# Patient Record
Sex: Male | Born: 1940
Health system: Southern US, Community
[De-identification: ages and names within clinical notes are randomized; demographics above are authoritative.]

## PROBLEM LIST (undated history)

## (undated) DIAGNOSIS — H9190 Unspecified hearing loss, unspecified ear: Secondary | ICD-10-CM

## (undated) DIAGNOSIS — D649 Anemia, unspecified: Secondary | ICD-10-CM

## (undated) DIAGNOSIS — R7301 Impaired fasting glucose: Secondary | ICD-10-CM

## (undated) DIAGNOSIS — I2510 Atherosclerotic heart disease of native coronary artery without angina pectoris: Secondary | ICD-10-CM

## (undated) DIAGNOSIS — I10 Essential (primary) hypertension: Secondary | ICD-10-CM

## (undated) DIAGNOSIS — I739 Peripheral vascular disease, unspecified: Secondary | ICD-10-CM

## (undated) DIAGNOSIS — M1990 Unspecified osteoarthritis, unspecified site: Secondary | ICD-10-CM

## (undated) DIAGNOSIS — I714 Abdominal aortic aneurysm, without rupture, unspecified: Secondary | ICD-10-CM

## (undated) DIAGNOSIS — N400 Enlarged prostate without lower urinary tract symptoms: Secondary | ICD-10-CM

## (undated) DIAGNOSIS — I499 Cardiac arrhythmia, unspecified: Secondary | ICD-10-CM

## (undated) DIAGNOSIS — C801 Malignant (primary) neoplasm, unspecified: Secondary | ICD-10-CM

## (undated) DIAGNOSIS — M545 Low back pain, unspecified: Secondary | ICD-10-CM

## (undated) DIAGNOSIS — I219 Acute myocardial infarction, unspecified: Secondary | ICD-10-CM

## (undated) DIAGNOSIS — E7800 Pure hypercholesterolemia, unspecified: Secondary | ICD-10-CM

## (undated) DIAGNOSIS — J449 Chronic obstructive pulmonary disease, unspecified: Secondary | ICD-10-CM

## (undated) DIAGNOSIS — H409 Unspecified glaucoma: Secondary | ICD-10-CM

## (undated) DIAGNOSIS — T7840XA Allergy, unspecified, initial encounter: Secondary | ICD-10-CM

## (undated) DIAGNOSIS — B370 Candidal stomatitis: Secondary | ICD-10-CM

## (undated) DIAGNOSIS — J45909 Unspecified asthma, uncomplicated: Secondary | ICD-10-CM

## (undated) DIAGNOSIS — K219 Gastro-esophageal reflux disease without esophagitis: Secondary | ICD-10-CM

## (undated) HISTORY — DX: Atherosclerotic heart disease of native coronary artery without angina pectoris: I25.10

## (undated) HISTORY — PX: BACK SURGERY: SHX140

## (undated) HISTORY — PX: ESOPHAGOGASTRODUODENOSCOPY: SHX1529

## (undated) HISTORY — PX: OTHER SURGICAL HISTORY: SHX169

## (undated) HISTORY — DX: Pure hypercholesterolemia, unspecified: E78.00

## (undated) HISTORY — DX: Acute myocardial infarction, unspecified: I21.9

## (undated) HISTORY — DX: Abdominal aortic aneurysm, without rupture, unspecified: I71.40

## (undated) HISTORY — DX: Essential (primary) hypertension: I10

## (undated) HISTORY — DX: Allergy, unspecified, initial encounter: T78.40XA

## (undated) HISTORY — DX: Chronic obstructive pulmonary disease, unspecified: J44.9

## (undated) HISTORY — PX: HERNIA REPAIR: SHX51

## (undated) HISTORY — DX: Unspecified asthma, uncomplicated: J45.909

## (undated) HISTORY — PX: CARDIAC CATHETERIZATION: SHX172

## (undated) HISTORY — DX: Impaired fasting glucose: R73.01

## (undated) HISTORY — DX: Abdominal aortic aneurysm, without rupture: I71.4

---

## 1989-08-18 DIAGNOSIS — C4491 Basal cell carcinoma of skin, unspecified: Secondary | ICD-10-CM

## 1989-08-18 HISTORY — DX: Basal cell carcinoma of skin, unspecified: C44.91

## 1997-05-17 DIAGNOSIS — I219 Acute myocardial infarction, unspecified: Secondary | ICD-10-CM | POA: Diagnosis present

## 1997-05-17 DIAGNOSIS — I252 Old myocardial infarction: Secondary | ICD-10-CM | POA: Diagnosis present

## 1997-05-17 HISTORY — DX: Acute myocardial infarction, unspecified: I21.9

## 1997-09-07 ENCOUNTER — Inpatient Hospital Stay (HOSPITAL_COMMUNITY): Admission: EM | Admit: 1997-09-07 | Discharge: 1997-09-11 | Payer: Self-pay | Admitting: Cardiology

## 1998-05-17 HISTORY — PX: CHOLECYSTECTOMY: SHX55

## 2000-12-02 ENCOUNTER — Emergency Department (HOSPITAL_COMMUNITY): Admission: EM | Admit: 2000-12-02 | Discharge: 2000-12-02 | Payer: Self-pay | Admitting: Internal Medicine

## 2001-11-22 DIAGNOSIS — C4492 Squamous cell carcinoma of skin, unspecified: Secondary | ICD-10-CM

## 2001-11-22 HISTORY — DX: Squamous cell carcinoma of skin, unspecified: C44.92

## 2003-10-09 DIAGNOSIS — D039 Melanoma in situ, unspecified: Secondary | ICD-10-CM

## 2003-10-09 HISTORY — DX: Melanoma in situ, unspecified: D03.9

## 2004-08-30 ENCOUNTER — Emergency Department (HOSPITAL_COMMUNITY): Admission: EM | Admit: 2004-08-30 | Discharge: 2004-08-30 | Payer: Self-pay | Admitting: Emergency Medicine

## 2006-06-15 ENCOUNTER — Emergency Department (HOSPITAL_COMMUNITY): Admission: EM | Admit: 2006-06-15 | Discharge: 2006-06-15 | Payer: Self-pay | Admitting: Emergency Medicine

## 2006-10-28 ENCOUNTER — Ambulatory Visit (HOSPITAL_COMMUNITY): Admission: RE | Admit: 2006-10-28 | Discharge: 2006-10-28 | Payer: Self-pay | Admitting: Family Medicine

## 2007-01-26 ENCOUNTER — Ambulatory Visit: Payer: Self-pay | Admitting: Cardiovascular Disease

## 2007-03-24 ENCOUNTER — Ambulatory Visit: Payer: Self-pay | Admitting: Cardiovascular Disease

## 2007-05-18 HISTORY — PX: COLONOSCOPY: SHX174

## 2007-08-28 ENCOUNTER — Ambulatory Visit: Payer: Self-pay | Admitting: Cardiovascular Disease

## 2007-12-07 ENCOUNTER — Ambulatory Visit (HOSPITAL_COMMUNITY): Admission: RE | Admit: 2007-12-07 | Discharge: 2007-12-07 | Payer: Self-pay | Admitting: Family Medicine

## 2007-12-08 ENCOUNTER — Ambulatory Visit: Payer: Self-pay | Admitting: Cardiovascular Disease

## 2007-12-14 ENCOUNTER — Ambulatory Visit: Payer: Self-pay

## 2008-04-15 ENCOUNTER — Ambulatory Visit: Payer: Self-pay | Admitting: Cardiovascular Disease

## 2008-04-15 DIAGNOSIS — I119 Hypertensive heart disease without heart failure: Secondary | ICD-10-CM | POA: Insufficient documentation

## 2008-04-15 DIAGNOSIS — I714 Abdominal aortic aneurysm, without rupture, unspecified: Secondary | ICD-10-CM | POA: Insufficient documentation

## 2008-04-15 DIAGNOSIS — J441 Chronic obstructive pulmonary disease with (acute) exacerbation: Secondary | ICD-10-CM | POA: Insufficient documentation

## 2008-04-15 DIAGNOSIS — E782 Mixed hyperlipidemia: Secondary | ICD-10-CM | POA: Insufficient documentation

## 2008-04-15 DIAGNOSIS — J449 Chronic obstructive pulmonary disease, unspecified: Secondary | ICD-10-CM

## 2008-04-15 DIAGNOSIS — E785 Hyperlipidemia, unspecified: Secondary | ICD-10-CM | POA: Insufficient documentation

## 2008-04-15 DIAGNOSIS — I2510 Atherosclerotic heart disease of native coronary artery without angina pectoris: Secondary | ICD-10-CM | POA: Insufficient documentation

## 2008-11-15 ENCOUNTER — Ambulatory Visit: Payer: Self-pay | Admitting: Cardiovascular Disease

## 2008-11-20 ENCOUNTER — Ambulatory Visit (HOSPITAL_COMMUNITY): Admission: RE | Admit: 2008-11-20 | Discharge: 2008-11-20 | Payer: Self-pay | Admitting: Cardiovascular Disease

## 2008-12-02 ENCOUNTER — Encounter (INDEPENDENT_AMBULATORY_CARE_PROVIDER_SITE_OTHER): Payer: Self-pay | Admitting: *Deleted

## 2008-12-02 LAB — CONVERTED CEMR LAB
ALT: 19 units/L
AST: 18 units/L
Albumin: 4 g/dL
Alkaline Phosphatase: 67 units/L
BUN: 10 mg/dL
Bilirubin, Direct: 0.2 mg/dL
CO2: 26 meq/L
Calcium: 9.2 mg/dL
Chloride: 104 meq/L
Cholesterol: 146 mg/dL
Creatinine, Ser: 1.1 mg/dL
Glucose, Bld: 106 mg/dL
HDL: 44 mg/dL
LDL Cholesterol: 83 mg/dL
Potassium: 5.3 meq/L
Sodium: 141 meq/L
Total Protein: 6.9 g/dL
Triglycerides: 97 mg/dL

## 2009-05-28 ENCOUNTER — Encounter (INDEPENDENT_AMBULATORY_CARE_PROVIDER_SITE_OTHER): Payer: Self-pay | Admitting: *Deleted

## 2009-05-29 ENCOUNTER — Ambulatory Visit: Payer: Self-pay | Admitting: Cardiovascular Disease

## 2009-05-29 DIAGNOSIS — R030 Elevated blood-pressure reading, without diagnosis of hypertension: Secondary | ICD-10-CM | POA: Insufficient documentation

## 2009-12-05 ENCOUNTER — Ambulatory Visit (HOSPITAL_COMMUNITY)
Admission: RE | Admit: 2009-12-05 | Discharge: 2009-12-05 | Payer: Self-pay | Source: Home / Self Care | Admitting: Cardiovascular Disease

## 2009-12-10 ENCOUNTER — Ambulatory Visit: Payer: Self-pay | Admitting: Cardiovascular Disease

## 2010-01-15 ENCOUNTER — Ambulatory Visit (HOSPITAL_COMMUNITY): Admission: RE | Admit: 2010-01-15 | Discharge: 2010-01-15 | Payer: Self-pay | Admitting: Family Medicine

## 2010-02-17 ENCOUNTER — Ambulatory Visit (HOSPITAL_COMMUNITY): Admission: RE | Admit: 2010-02-17 | Discharge: 2010-02-17 | Payer: Self-pay | Admitting: Family Medicine

## 2010-06-07 ENCOUNTER — Encounter: Payer: Self-pay | Admitting: Orthopaedic Surgery

## 2010-06-16 NOTE — Miscellaneous (Signed)
Summary: LABS BMP,LIPID,LIVER 12/02/2008  Clinical Lists Changes  Observations: Added new observation of CALCIUM: 9.2 mg/dL (60/45/4098 11:91) Added new observation of ALBUMIN: 4.0 g/dL (47/82/9562 13:08) Added new observation of PROTEIN, TOT: 6.9 g/dL (65/78/4696 29:52) Added new observation of SGPT (ALT): 19 units/L (12/02/2008 15:26) Added new observation of SGOT (AST): 18 units/L (12/02/2008 15:26) Added new observation of ALK PHOS: 67 units/L (12/02/2008 15:26) Added new observation of BILI DIRECT: 0.2 mg/dL (84/13/2440 10:27) Added new observation of CREATININE: 1.10 mg/dL (25/36/6440 34:74) Added new observation of BUN: 10 mg/dL (25/95/6387 56:43) Added new observation of BG RANDOM: 106 mg/dL (32/95/1884 16:60) Added new observation of CO2 PLSM/SER: 26 meq/L (12/02/2008 15:26) Added new observation of CL SERUM: 104 meq/L (12/02/2008 15:26) Added new observation of K SERUM: 5.3 meq/L (12/02/2008 15:26) Added new observation of NA: 141 meq/L (12/02/2008 15:26) Added new observation of LDL: 83 mg/dL (63/05/6008 93:23) Added new observation of HDL: 44 mg/dL (55/73/2202 54:27) Added new observation of TRIGLYC TOT: 97 mg/dL (11/07/7626 31:51) Added new observation of CHOLESTEROL: 146 mg/dL (76/16/0737 10:62)

## 2010-06-16 NOTE — Assessment & Plan Note (Signed)
Summary: 6 mth f/u per checkout on 05/29/09/tg   Visit Type:  Follow-up Primary Provider:  Leward Quan  CC:  no cardiology complaints.  History of Present Illness: [name] [name] is seen today in followup for his coronary artery disease.  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 Juanda Chance  I believe back in 1999.  His last Myoview in July of 2009 was nonischemic.  He has good LV function.  His been compliant with his meds.  He has a 3.3 x 2.8 cm AAA by duplex this month  He has not had t any abdominal pain.  He is active.  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  He has to boxers at home that he is also active with.them.  He prefers to have his duplex at [facility].  He continues to see [name] for his primary care needs. He has significant COPD and quit smoking 6 years ago.  I offered for him to see our lung doctors but he has an inhaler and will F/U with Dr Gerda Diss unless the COPD gets worse  Current Problems (verified): 1)  Elevated Bp Reading Without Dx Hypertension  (ICD-796.2) 2)  Mixed Hyperlipidemia  (ICD-272.2) 3)  Ben Htn Heart Disease Without Heart Fail  (ICD-402.10) 4)  Chronic Airway Obstruction Nec  (ICD-496) 5)  Aaa  (ICD-441.4) 6)  Cad  (ICD-414.00)  Current Medications (verified): 1)  Lovastatin 40 Mg Tabs (Lovastatin) .... Take One Tablet By Mouth Daily At Bedtime 2)  Coreg 6.25 Mg Tabs (Carvedilol) .... Take 1 Tablet By Mouth Twice A Day 3)  Nitroglycerin 0.4 Mg Subl (Nitroglycerin) .... Place 1 Tablet Under Tongue As Directed 4)  Fish Oil   Oil (Fish Oil) .Marland Kitchen.. 1 Atb By Mouth Once Daily 5)  Aspirin 81 Mg  Tabs (Aspirin) .Marland Kitchen.. 1 Atb By Mouth Once Daily  Allergies (verified): No Known Drug Allergies  Past History:  Past Medical History: Last updated: 04/15/2008 IMI:  Previous patient of Earna Coder in [place] ? PCI in 1999 Hypercholesterolemia AAA Hypertension COPD  Family History: Last updated:  04/15/2008 non-contributory  Social History: Last updated: 04/15/2008 Married Outdoosman:  Skeet shooting Son with premature Parkinsons Son died Daughter living Friends with Leanord Asal in cath lab  Review of Systems       Denies fever, malais, weight loss, blurry vision, decreased visual acuity, cough, sputum, SOB, hemoptysis, pleuritic pain, palpitaitons, heartburn, abdominal pain, melena, lower extremity edema, claudication, or rash.   Vital Signs:  Patient profile:   70 year old male Weight:      207 pounds BMI:     28.18 Pulse rate:   87 / minute BP sitting:   123 / 82  (right arm)  Vitals Entered By: Dreama Saa, CNA (December 10, 2009 9:30 AM)  Physical Exam  General:  Affect appropriate Healthy:  appears stated age HEENT: normal Neck supple with no adenopathy JVP normal no bruits no thyromegaly Lungs wheezing at right base  but  good diaphragmatic motion Heart:  S1/S2 no murmur,rub, gallop or click PMI normal Abdomen: benighn, BS positve, no tenderness, no AAA no bruit.  No HSM or HJR Distal pulses intact with no bruits No edema Neuro non-focal Skin warm and dry    Impression & Recommendations:  Problem # 1:  MIXED HYPERLIPIDEMIA (ICD-272.2) At goal with no side effects The following medications were removed from the medication list:    Zetia 10 Mg Tabs (Ezetimibe) .Marland Kitchen... Take 1  tablet by mouth at bedtime    Altoprev 40 Mg Xr24h-tab (Lovastatin) .Marland Kitchen... Take 1 tablet by mouth at bedtime His updated medication list for this problem includes:    Lovastatin 40 Mg Tabs (Lovastatin) .Marland Kitchen... Take one tablet by mouth daily at bedtime  CHOL: 146 (12/02/2008)   LDL: 83 (12/02/2008)   HDL: 44 (12/02/2008)   TG: 97 (12/02/2008)  Problem # 2:  CHRONIC AIRWAY OBSTRUCTION NEC (ICD-496) Continue inhaler Rx.  May benefit from Advair  f/U Dr Gerda Diss  Problem # 3:  AAA (ICD-441.4) Stable by duplex.  Not palpable on exam.  F/U duplex in a year  Problem # 4:  CAD  (ICD-414.00) Stable with no angina.  Continue ASA and BB His updated medication list for this problem includes:    Coreg 6.25 Mg Tabs (Carvedilol) .Marland Kitchen... Take 1 tablet by mouth twice a day    Nitroglycerin 0.4 Mg Subl (Nitroglycerin) .Marland Kitchen... Place 1 tablet under tongue as directed    Aspirin 81 Mg Tabs (Aspirin) .Marland Kitchen... 1 atb by mouth once daily  Patient Instructions: 1)  Your physician recommends that you schedule a follow-up appointment in: 1 year 2)  Your physician recommends that you continue on your current medications as directed. Please refer to the Current Medication list given to you today.

## 2010-06-16 NOTE — Assessment & Plan Note (Signed)
Summary: 6 month rov [place] office/sl   Visit Type:  Follow-up Primary Provider:  Leward Quan   History of Present Illness: [name] [name] is seen today in followup for his coronary artery disease.  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 Juanda Chance  I believe back in 1999.  His last Myoview in July of 2009 was nonischemic.  He has good LV function.  His been compliant with his meds.  He has a 3 x 3.2 cm AAA which needs a followup duplex  in July 2011 He has not had t any abdominal pain.  He is active.  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  He has to boxers at home that he is also active with.them.  He prefers to have his duplex at [facility]  He continues to see [name] for his primary care needs. He has had a bad cold for the last two weeks and is on Levaquin and a steroid taper.    Current Problems (verified): 1)  Mixed Hyperlipidemia  (ICD-272.2) 2)  Ben Htn Heart Disease Without Heart Fail  (ICD-402.10) 3)  Chronic Airway Obstruction Nec  (ICD-496) 4)  Aaa  (ICD-441.4) 5)  Cad  (ICD-414.00)  Current Medications (verified): 1)  Lovastatin 40 Mg Tabs (Lovastatin) .... Take One Tablet By Mouth Daily At Bedtime 2)  Coreg 6.25 Mg Tabs (Carvedilol) .... Take 1 Tablet By Mouth Twice A Day 3)  Nitroglycerin 0.4 Mg Subl (Nitroglycerin) .... Place 1 Tablet Under Tongue As Directed 4)  Zetia 10 Mg Tabs (Ezetimibe) .... Take 1 Tablet By Mouth At Bedtime 5)  Fish Oil   Oil (Fish Oil) .Marland Kitchen.. 1 Atb By Mouth Once Daily 6)  Aspirin 81 Mg  Tabs (Aspirin) .Marland Kitchen.. 1 Atb By Mouth Once Daily 7)  Altoprev 40 Mg Xr24h-Tab (Lovastatin) .... Take 1 Tablet By Mouth At Bedtime  Allergies (verified): No Known Drug Allergies  Past History:  Past Medical History: Last updated: 04/15/2008 IMI:  Previous patient of Earna Coder in [place] ? PCI in 1999 Hypercholesterolemia AAA Hypertension COPD  Family History: Last updated: 04/15/2008 non-contributory  Social  History: Last updated: 04/15/2008 Married Outdoosman:  Skeet shooting Son with premature Parkinsons Son died Daughter living Friends with Leanord Asal in cath lab  Review of Systems       Denies fever, malais, weight loss, blurry vision, decreased visual acuity, , sputum, SOB, hemoptysis, pleuritic pain, palpitaitons, heartburn, abdominal pain, melena, lower extremity edema, claudication, or rash. All other systems reviewed and negative except as noted in HPI  Vital Signs:  Patient profile:   70 year old male Weight:      208 pounds Pulse rate:   89 / minute BP sitting:   149 / 97  (right arm)  Vitals Entered By: Dreama Saa, CNA (May 29, 2009 10:06 AM)  Physical Exam  General:  Affect appropriate Healthy:  appears stated age HEENT: normal Neck supple with no adenopathy JVP normal no bruits no thyromegaly Lungs clear with no wheezing and good diaphragmatic motion Heart:  S1/S2 no murmur,rub, gallop or click PMI normal Abdomen: benighn, BS positve, no tenderness, no AAA no bruit.  No HSM or HJR Distal pulses intact with no bruits No edema Neuro non-focal Skin warm and dry    Impression & Recommendations:  Problem # 1:  CAD (ICD-414.00)  Stable no angina.  [facility] The following medications were removed from the medication list:    Coreg  6.25 Mg Tabs (Carvedilol) .Marland Kitchen... Take 1 tablet by mouth twice a day    Nitroglycerin 0.4 Mg Subl (Nitroglycerin) .Marland Kitchen... Place 1 tablet under tongue as directed His updated medication list for this problem includes:    Coreg 6.25 Mg Tabs (Carvedilol) .Marland Kitchen... Take 1 tablet by mouth twice a day    Nitroglycerin 0.4 Mg Subl (Nitroglycerin) .Marland Kitchen... Place 1 tablet under tongue as directed    Aspirin 81 Mg Tabs (Aspirin) .Marland Kitchen... 1 atb by mouth once daily  His updated medication list for this problem includes:    Coreg 6.25 Mg Tabs (Carvedilol) .Marland Kitchen... Take 1 tablet by mouth twice a day    Nitroglycerin 0.4 Mg Subl  (Nitroglycerin) .Marland Kitchen... Place 1 tablet under tongue as directed    Aspirin 81 Mg Tabs (Aspirin) .Marland Kitchen... 1 atb by mouth once daily  Problem # 2:  MIXED HYPERLIPIDEMIA (ICD-272.2)  At target with no side effects The following medications were removed from the medication list:    Zetia 10 Mg Tabs (Ezetimibe) .Marland Kitchen... Take 1 tablet by mouth at bedtime His updated medication list for this problem includes:    Lovastatin 40 Mg Tabs (Lovastatin) .Marland Kitchen... Take one tablet by mouth daily at bedtime    Zetia 10 Mg Tabs (Ezetimibe) .Marland Kitchen... Take 1 tablet by mouth at bedtime    Altoprev 40 Mg Xr24h-tab (Lovastatin) .Marland Kitchen... Take 1 tablet by mouth at bedtime  CHOL: 146 (12/02/2008)   LDL: 83 (12/02/2008)   HDL: 44 (12/02/2008)   TG: 97 (12/02/2008)  His updated medication list for this problem includes:    Lovastatin 40 Mg Tabs (Lovastatin) .Marland Kitchen... Take one tablet by mouth daily at bedtime    Zetia 10 Mg Tabs (Ezetimibe) .Marland Kitchen... Take 1 tablet by mouth at bedtime    Altoprev 40 Mg Xr24h-tab (Lovastatin) .Marland Kitchen... Take 1 tablet by mouth at bedtime  Problem # 3:  CHRONIC AIRWAY OBSTRUCTION NEC (ICD-496) Continue steroid taper which is likely raising his BP a two times a day.  Levaquin per [place].  No evidence of pneumonia on exam  Problem # 4:  ELEVATED BP READING WITHOUT DX HYPERTENSION (ICD-796.2) Will monitor at home.  Continue BB.  Low sodium diet and avoid phenylephrine type products  Other Orders: Ultrasound (Ultrasound)  Patient Instructions: 1)  Your physician recommends that you schedule a follow-up appointment on same day of test. 2)  Your physician recommends that you continue on your current medications as directed. Please refer to the Current Medication list given to you today. Prescriptions: NITROGLYCERIN 0.4 MG SUBL (NITROGLYCERIN) Place 1 tablet under tongue as directed  #25 x 3   Entered by:   Larita Fife Via LPN   Authorized by:   Colon Branch, MD, [facility]   Signed by:   Larita Fife Via LPN on 04/54/0981    Method used:   [facility]        [facility] [phone]* (retail)       [address]       [place], [place]  [address]       Ph: [phone] or [phone]       Fax: [phone]   RxID:   [id number]

## 2010-06-16 NOTE — Assessment & Plan Note (Signed)
Summary: 6 MO F/U  Medications Added ALTOPREV 40 MG XR24H-TAB (LOVASTATIN) Take 1 tablet by mouth at bedtime COREG 6.25 MG TABS (CARVEDILOL) Take 1 tablet by mouth twice a day NITROGLYCERIN 0.4 MG SUBL (NITROGLYCERIN) Place 1 tablet under tongue as directed ZETIA 10 MG TABS (EZETIMIBE) Take 1 tablet by mouth at bedtime FISH OIL   OIL (FISH OIL) 1 atb by mouth once daily ASPIRIN 81 MG  TABS (ASPIRIN) 1 atb by mouth once daily ALTOPREV 40 MG XR24H-TAB (LOVASTATIN) Take 1 tablet by mouth at bedtime COREG 6.25 MG TABS (CARVEDILOL) Take 1 tablet by mouth twice a day NITROGLYCERIN 0.4 MG SUBL (NITROGLYCERIN) Place 1 tablet under tongue as directed ZETIA 10 MG TABS (EZETIMIBE) Take 1 tablet by mouth at bedtime      Allergies Added: NKDA  History of Present Illness: [name] [name] is seen today in followup for his coronary artery disease.  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 Dickie La I believe back in 1999.  His last Myoview in July of 2009 was nonischemic.  He has good LV function.  His been compliant with his meds.  He has a 3 x 3.1 cm AAA which needs a followup duplex this month.  Is not any abdominal pain.  He is active.  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  He has to boxers at home that he is also active with.them.  He prefers to have his duplex at [facility].  He continues to see [name] for his primary care needs.  Current Problems (verified): 1)  Mixed Hyperlipidemia  (ICD-272.2) 2)  Ben Htn Heart Disease Without Heart Fail  (ICD-402.10) 3)  Chronic Airway Obstruction Nec  (ICD-496) 4)  Aaa  (ICD-441.4) 5)  Cad  (ICD-414.00)  Current Medications (verified): 1)  Altoprev 40 Mg Xr24h-Tab (Lovastatin) .... Take 1 Tablet By Mouth At Bedtime 2)  Coreg 6.25 Mg Tabs (Carvedilol) .... Take 1 Tablet By Mouth Twice A Day 3)  Nitroglycerin 0.4 Mg Subl (Nitroglycerin) .... Place 1 Tablet Under Tongue As Directed 4)  Zetia 10 Mg Tabs (Ezetimibe) .... Take  1 Tablet By Mouth At Bedtime 5)  Fish Oil   Oil (Fish Oil) .Marland Kitchen.. 1 Atb By Mouth Once Daily 6)  Aspirin 81 Mg  Tabs (Aspirin) .Marland Kitchen.. 1 Atb By Mouth Once Daily 7)  Altoprev 40 Mg Xr24h-Tab (Lovastatin) .... Take 1 Tablet By Mouth At Bedtime 8)  Coreg 6.25 Mg Tabs (Carvedilol) .... Take 1 Tablet By Mouth Twice A Day 9)  Nitroglycerin 0.4 Mg Subl (Nitroglycerin) .... Place 1 Tablet Under Tongue As Directed 10)  Zetia 10 Mg Tabs (Ezetimibe) .... Take 1 Tablet By Mouth At Bedtime  Allergies (verified): No Known Drug Allergies  Past History:  Past Medical History: Last updated: 04/15/2008 IMI:  Previous patient of Earna Coder in [place] ? PCI in 1999 Hypercholesterolemia AAA Hypertension COPD  Family History: Last updated: 04/15/2008 non-contributory  Social History: Last updated: 04/15/2008 Married Outdoosman:  Skeet shooting Son with premature Parkinsons Son died Daughter living Friends with Leanord Asal in cath lab  Review of Systems       Denies fever, malais, weight loss, blurry vision, decreased visual acuity, cough, sputum, SOB, hemoptysis, pleuritic pain, palpitaitons, heartburn, abdominal pain, melena, lower extremity edema, claudication, or rash. All other systems reviewed and negative  Vital Signs:  Patient profile:   70 year old male Height:      72 inches Weight:  208 pounds BMI:     28.31 Pulse rate:   76 / minute Resp:     12 per minute BP sitting:   152 / 91  (right arm)  Vitals Entered By: Kem Parkinson (November 15, 2008 4:00 PM)  Physical Exam  General:  Affect appropriate Healthy:  appears stated age HEENT: normal Neck supple with no adenopathy JVP normal no bruits no thyromegaly Lungs clear with no wheezing and good diaphragmatic motion Heart:  S1/S2 no murmur,rub, gallop or click PMI normal Abdomen: benighn, BS positve, no tenderness, no AAA no bruit.  No HSM or HJR Distal pulses intact with no bruits No edema Neuro non-focal Skin warm and  dry    Impression & Recommendations:  Problem # 1:  CAD (ICD-414.00) Stable no angina low risk myouve 2009 His updated medication list for this problem includes:    Coreg 6.25 Mg Tabs (Carvedilol) .Marland Kitchen... Take 1 tablet by mouth twice a day    Nitroglycerin 0.4 Mg Subl (Nitroglycerin) .Marland Kitchen... Place 1 tablet under tongue as directed    Aspirin 81 Mg Tabs (Aspirin) .Marland Kitchen... 1 atb by mouth once daily  Problem # 2:  MIXED HYPERLIPIDEMIA (ICD-272.2) F/U lipid and liver with Dr. Gerda Diss His updated medication list for this problem includes:    Altoprev 40 Mg Xr24h-tab (Lovastatin) .Marland Kitchen... Take 1 tablet by mouth at bedtime    Zetia 10 Mg Tabs (Ezetimibe) .Marland Kitchen... Take 1 tablet by mouth at bedtime  Problem # 3:  AAA (ICD-441.4) F/U duplex at [facility].  Baselin size 3x3.1cm  Other Orders: Abdominal Aorta Duplex (Abd Aorta Duplex)  Patient Instructions: 1)  Your physician recommends that you schedule a follow-up appointment in: 6 MONTHS IN [place] 2)  Your physician has requested that you have an abdominal aorta duplex. During this test, an ultrasound is used to evaluate the aorta. Allow 30 minutes for this exam. Do not eat after midnight the day before and avoid carbonated beverages. There are no restrictions or special instructions. IN [place]

## 2010-07-06 ENCOUNTER — Ambulatory Visit (HOSPITAL_COMMUNITY)
Admission: RE | Admit: 2010-07-06 | Discharge: 2010-07-06 | Disposition: A | Discharge status: Home / Self Care | Payer: Medicare Other | Source: Ambulatory Visit | Attending: Ophthalmology | Admitting: Ophthalmology

## 2010-07-06 DIAGNOSIS — Z7982 Long term (current) use of aspirin: Secondary | ICD-10-CM | POA: Insufficient documentation

## 2010-07-06 DIAGNOSIS — H4089 Other specified glaucoma: Secondary | ICD-10-CM | POA: Insufficient documentation

## 2010-09-11 ENCOUNTER — Encounter: Payer: Self-pay | Admitting: Cardiovascular Disease

## 2010-09-15 ENCOUNTER — Ambulatory Visit (INDEPENDENT_AMBULATORY_CARE_PROVIDER_SITE_OTHER): Payer: Medicare Other | Admitting: Cardiovascular Disease

## 2010-09-15 ENCOUNTER — Encounter: Payer: Self-pay | Admitting: Cardiovascular Disease

## 2010-09-15 DIAGNOSIS — R55 Syncope and collapse: Secondary | ICD-10-CM

## 2010-09-15 DIAGNOSIS — E782 Mixed hyperlipidemia: Secondary | ICD-10-CM

## 2010-09-15 DIAGNOSIS — I491 Atrial premature depolarization: Secondary | ICD-10-CM | POA: Insufficient documentation

## 2010-09-15 DIAGNOSIS — I119 Hypertensive heart disease without heart failure: Secondary | ICD-10-CM

## 2010-09-15 DIAGNOSIS — I2510 Atherosclerotic heart disease of native coronary artery without angina pectoris: Secondary | ICD-10-CM

## 2010-09-15 NOTE — Assessment & Plan Note (Signed)
Well controlled.  Continue current medications and low sodium Dash type diet.    

## 2010-09-15 NOTE — Assessment & Plan Note (Signed)
Stable with no angina and good activity level.  Continue medical Rx  

## 2010-09-15 NOTE — Assessment & Plan Note (Signed)
Presyncope and dizzyness likely related to sinuses.  Pseudofed in meds may be persipitating PACs;  They were asymptomatic in the office.  Will get event monitor and echo to make sure EF still good.

## 2010-09-15 NOTE — Assessment & Plan Note (Signed)
Cholesterol is at goal.  Continue current dose of statin and diet Rx.  No myalgias or side effects.  F/U  LFT's in 6 months. Lab Results  Component Value Date   LDLCALC 83 12/02/2008             

## 2010-09-15 NOTE — Progress Notes (Signed)
[name] [name] is seen today in followup for his coronary artery disease.  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 Juanda [name]  I believe back in 1999.  His last Myoview in July of 2009 was nonischemic.  He has good LV function.  His been compliant with his meds.  He has a 3 x 3.2 cm AAA which needs a followup in a year.   He has not had t any abdominal pain.  He is active.  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  He has to boxers at home that he is also active with.them.  He prefers to have his duplex at [facility].  He continues to see [name] [name] for his primary care needs.  Continues to have issues with sinuses.  Episodes of "sleepyness" No palpitations but frequent PAC;s in office.  Dr [name] [name] concerned about :syncope.  Low risk for ventricular arrhythmias with good EF and no angina.    ROS: Denies fever, malais, weight loss, blurry vision, decreased visual acuity, cough, sputum, SOB, hemoptysis, pleuritic pain, palpitaitons, heartburn, abdominal pain, melena, lower extremity edema, claudication, or rash.   General: Affect appropriate Healthy:  appears stated age HEENT: normal Neck supple with no adenopathy JVP normal no bruits no thyromegaly Lungs clear with no wheezing and good diaphragmatic motion Heart:  S1/S2 no murmur,rub, gallop or click PMI normal Abdomen: benighn, BS positve, no tenderness, no AAA no bruit.  No HSM or HJR Distal pulses intact with no bruits No edema Neuro non-focal Skin warm and dry No muscular weakness   Current Outpatient Prescriptions  Medication Sig Dispense Refill  . albuterol (PROVENTIL) (2.5 MG/3ML) 0.083% nebulizer solution Take 2.5 mg by nebulization every 6 (six) hours as needed.        Marland Kitchen aspirin 81 MG tablet Take 81 mg by mouth daily.        . cetirizine (ZYRTEC) 10 MG chewable tablet Chew 10 mg by mouth daily.        Marland Kitchen esomeprazole (NEXIUM) 40 MG capsule Take 40 mg by mouth daily before breakfast.        . fish  oil-omega-3 fatty acids 1000 MG capsule Take 2 g by mouth daily.       Marland Kitchen lovastatin (MEVACOR) 40 MG tablet Take 40 mg by mouth at bedtime.        . montelukast (SINGULAIR) 10 MG tablet Take 10 mg by mouth at bedtime.        . nitroGLYCERIN (NITROSTAT) 0.4 MG SL tablet Place 0.4 mg under the tongue every 5 (five) minutes as needed.        Marland Kitchen DISCONTD: carvedilol (COREG) 6.25 MG tablet Take 6.25 mg by mouth 2 (two) times daily with a meal.          Allergies  Review of patient's allergies indicates not on file.  Electrocardiogram:  NSR frrequent PAC;s  Assessment and Plan

## 2010-09-15 NOTE — Patient Instructions (Signed)
Your physician recommends that you schedule a follow-up appointment in: 6-8 WEEKS WITH DR [facility]  Your physician has requested that you have an echocardiogram. Echocardiography is a painless test that uses sound waves to create images of your heart. It provides your doctor with information about the size and shape of your heart and how well your heart's chambers and valves are working. This procedure takes approximately one hour. There are no restrictions for this procedure. [place] OFFICE  DX SYNCOPE AND PRESYNCOPE  Your physician has recommended that you wear an event monitor. Event monitors are medical devices that record the heart's electrical activity. Doctors most often [place] these monitors to diagnose arrhythmias. Arrhythmias are problems with the speed or rhythm of the heartbeat. The monitor is a small, portable device. You can wear one while you do your normal daily activities. This is usually used to diagnose what is causing palpitations/syncope (passing out). SYNCOPE     Your physician recommends that you continue on your current medications as directed. Please refer to the Current Medication list given to you today.

## 2010-09-16 ENCOUNTER — Telehealth: Payer: Self-pay | Admitting: Cardiovascular Disease

## 2010-09-16 NOTE — Telephone Encounter (Signed)
Pt has question re echo test. Pt would like to talk to [name] [name].

## 2010-09-16 NOTE — Telephone Encounter (Signed)
Spoke with pt, pt questions answered [name] [name]

## 2010-09-18 ENCOUNTER — Ambulatory Visit (HOSPITAL_COMMUNITY)
Admission: RE | Admit: 2010-09-18 | Discharge: 2010-09-18 | Disposition: A | Discharge status: Home / Self Care | Payer: Medicare Other | Source: Ambulatory Visit | Attending: Cardiovascular Disease | Admitting: Cardiovascular Disease

## 2010-09-18 DIAGNOSIS — R55 Syncope and collapse: Secondary | ICD-10-CM | POA: Insufficient documentation

## 2010-09-29 NOTE — Assessment & Plan Note (Signed)
[name]                       [place] CARDIOLOGY OFFICE NOTE   NAME:[name] [name], [name] [name]                        MRN:          [id number]  DATE:08/28/2007                            DOB:          [birth year]    [name] [name] returns today for followup.  He is a friend of [name] [name]'s.   He has had distant history coronary artery disease with an angioplasty  1999.  He has hypercholesterolemia, borderline hypertension and a AAA  measuring about 3.2 cm.  He needs a followup ultrasound in November.  Has been doing well.  He is not having any significant chest pain, PND,  orthopnea.  There has been no shortness of breath, palpitations, or  dizziness.  He has not had any significant abdominal pain.   REVIEW OF SYSTEMS:  Remarkable for a URI 4 to 6 weeks ago.  He had a lot  of coughing.  He was concerned that this would make it aneurysm worse.  I told him it would not.  He also has significant problem with dry eyes.  He was seen up in [name] [name] and other places.  There is a question of  Sjogren's syndrome, but his Sjogren's syndrome antibody, SSA/Ro, ENA and  SSB/LA/ENA were negative.   He was unable to take Zetia with his statin drug.  It made him too weak.  He is continuing on his lovastatin.   His dry eyes seem to be improved with lubrication.  The final diagnosis  would appear to just be dry eyes.   His medications currently include lovastatin 40 a day, Nexium 40 a day,  an aspirin a day, Zyrtec, and omega fish oil.   EXAM:  Remarkable for healthy-appearing elderly white male in no  distress.  Blood pressure 120/70, pulse 70 and regular, afebrile, respiratory rate  14.  HEENT:  Unremarkable.  Carotids are normal without bruit, no lymphadenopathy, thyromegaly JVP  elevation.  LUNGS:  Clear with good diaphragmatic motion.  No wheezing.  S1-S2 normal heart sounds, PMI normal.  ABDOMEN:  Benign.  I cannot feel AAA.  No tenderness, no bruit, no  hepatosplenomegaly.  No hepatojugular reflux.  Distal pulse intact, no edema.  NEURO:  Nonfocal.  SKIN:  Warm and dry.  No muscle weakness.   IMPRESSION:  1. Distant history coronary artery disease.  Continue aspirin therapy.      Consider followup Myoview in the year.  2. Hypercholesterolemia intolerant to combination lovastatin and      Zetia.  However, on 40 of lovastatin his LDL cholesterol is      currently 85 and his liver tests are normal.  Continue current      therapy.  3. Abdominal aortic aneurysm.  Blood pressure under good control      without beta blocker.  Followup duplex in November.  4. Dry eyes.  Continue fish oil and eyedrops.  Follow up in [facility].  No evidence of Sjogren's syndrome.  5. Borderline hypertension, currently stable, low-salt diet.  No      indication for therapy.  6. History of gastroesophageal reflux  disease.  Continue Nexium 40 a      day.  Avoid spicy foods and late-night meals.   I will see [name] [name] back in November when he has ultrasound.     Noralyn Pick. Eden Emms, MD, [facility]  Electronically Signed    PCN/MedQ  DD: 08/28/2007  DT: 08/28/2007  Job #: [id number]

## 2010-09-29 NOTE — Assessment & Plan Note (Signed)
[facility]                            CARDIOLOGY OFFICE NOTE   NAME:Bubar, SOSTENES KAUFFMANN                        MRN:          [id number]  DATE:12/08/2007                            DOB:          Sep 24, 1940    HISTORY OF PRESENT ILLNESS:  [name] [name] returns today for followup.   He has had a distant history of myocardial infarction.   I believe he had an angioplasty in 1999.  I do not have details of this.   He has a history of a AAA.  He does have an abdominal ultrasound done in  [facility] yesterday.  I reviewed it.  His aneurysm was only 3 x 3.1 cm  and stable.  The patient was seen at [facility] about 6-8 months ago for  chest pain, he ruled out and follow up with Dr. Gerda Diss.  Subsequently in  June, he had an episode where he woke up with some burning in the left  side of his chest.  He thought it was his reflux.  It seemed to improve  with antacids.   Given his ER visit and his recent episode of pain, Dr. Gerda Diss referred  him back to me.  I would agree that the patient certainly needs to have  a followup stress Myoview given his history of an MI and coronary risk  factors, which include hypercholesterolemia.   In talking to Ahren, he is otherwise active.  He hunts and ski chutes  and is not currently having chest pain.  There is no palpitations,  syncope, or dyspnea.   REVIEW OF SYSTEMS:  Otherwise, negative.   ALLERGIES:  He is allergic to PENICILLIN.   MEDICATIONS:  1. He is on lovastatin, question of his dose.  2. Nexium 40 a day.  3. Aspirin a day.  4. Zyrtec.  5. Omega fish oil.   PHYSICAL EXAMINATION:  VITAL SIGNS:  Remarkable for blood pressure of  140/80, pulse 69 and regular, weight 205, respiratory rate 14, afebrile.  HEENT:  Unremarkable.  Carotids are normal without bruit.  No  lymphadenopathy, thyromegaly, or JVP elevation.  LUNGS:  Clear.  Good diaphragmatic motion.  No wheezing.  S1 and S2.  Normal heart sounds.  PMI normal.  ABDOMEN:  Benign.  Bowel sounds positive.  No AAA.  No tenderness.  No  bruit.  No hepatosplenomegaly or hepatojugular reflux.  EXTREMITIES:  Distal pulses are intact.  No edema.  NEURO:  Nonfocal.  SKIN:  Warm and dry.  MUSCULOSKELETAL:  No muscular weakness.   His EKG shows sinus rhythm with nonspecific ST-T wave changes.  No acute  changes.  His LDL cholesterol was 76 with normal LFTs.  These were  checked on July 01, 2007.   IMPRESSION:  1. Chest pain, previous history of myocardial infarction. We will try      to get records from [facility], as I do not know what his previous      angioplasty involved.  He needs a followup stress Myoview.      Consider adding beta-blockade depending on results of his heart  rate and blood pressure response to stress testing.  2. Hypercholesterolemia.  Continue lovastatin, lipid and liver profile      another year.  3. History of reflux.  This may be the etiology to his pain.  Continue      Nexium.  Avoid spicy food and late night meals.  4. Seasonal allergies.  Continue Zyrtec.  5. Hypertriglyceridemia.  Continue fish oil.  Overall, I think Ido      is doing well.  I am not sure what to make of this pain, but I      think it is low-risk.  He will be seen in 6 months as long as his      stress Myoview is low-risk.     Noralyn Pick. Eden Emms, MD, [facility]  Electronically Signed    PCN/MedQ  DD: 12/08/2007  DT: 12/09/2007  Job #: [id number]

## 2010-09-29 NOTE — Assessment & Plan Note (Signed)
[facility]                       [place] CARDIOLOGY OFFICE NOTE   NAME:LYNNNathen, [name]                        MRN:          [id number]  DATE:03/24/2007                            DOB:          1940/10/14    DATE OF VISIT:  March 24, 2007.   [name] returns today for followup.   I have seen him primarily for risk factor modification.  He has a  distant history of angioplasty by Dr. Juanda Chance in '99, and he has a 3.2 x  3.4 abdominal aortic aneurysm.   The patient has been doing fairly well.  He has been monitoring his  blood pressure at home.  In general, they have been running fine and in  the office today they were fine.  He has a bit of white coat  hypertension.   Apparently his cholesterol has been elevated at Dr. Constance Haw  office.  He has been INTOLERANT TO CRESTOR, VYTORIN, and LIPITOR in the  past.  In general, he gets myalgias and feels poorly with leg cramps.  He is able to tolerate Lovastatin at a 40 mg dose but not higher.  I  told him we would probably try Zetia in addition to this as a single  agent to see if he can tolerate it with the Lovastatin.   REVIEW OF SYSTEMS:  Otherwise negative.  In particular, he has not had  any significant chest pain, PND, or orthopnea, and no palpitations or  syncope.   CURRENT MEDICATIONS:  1. Nexium 40 a day.  2. Lovastatin 40 a day.  3. An aspirin a day.  4. Librax, Zyrtec, and eye drops for some rosacea that he is having in      his eyelids.   PHYSICAL EXAMINATION:  VITAL SIGNS:  Blood pressure of 120/80, pulse is  70 and regular, afebrile, weight was 207, respiratory rate 14, pulse 64.  HEENT:  Unremarkable.  NECK:  Supple, no bruits, no lymphadenopathy, no thyromegaly, or JVP  elevation.  LUNGS:  Clear with good diaphragmatic motion and no wheezing.  HEART:  S1 and S2 without an S4 or gallop, no murmur, PMI normal.  ABDOMEN:  Benign, no inguinal bruit, I cannot palpate his  abdominal  aorta, no hepatosplenomegaly or hepatojugular reflux, no tenderness.  EXTREMITIES:  Femorals are +3 bilaterally, PTs are +3, no lower  extremity edema.  NEURO:  Nonfocal and no muscular weakness.   IMPRESSION:  1. Distant history of coronary artery disease currently stable, no      reason to add beta blocker, continue aspirin.  2. Risk factor modification, hypercholesterolemia, INTOLERANT TO      MULTIPLE DRUGS, add Zetia 10 mg a day to Lovastatin; this was      called in to [facility] [address] in [place].  He will      let us know if he tolerates this.  3. Borderline hypertension.  Continue a low salt diet, no indication      for therapy at this time.  4. Rosacea in the eyelids.  Continue drops per dermatologist; I  believe they are FML drops to each eye t.i.d.   Overall, I think [name] [name] is doing fine.  In regards to his AAA, he will  need a followup duplex scan in a year.  At 3.2 cm, this is unlikely to  cause a problem in the near future.     [name] Pick. Eden Emms, MD, [facility]  Electronically Signed    PCN/MedQ  DD: 03/24/2007  DT: 03/24/2007  Job #: [id number]

## 2010-09-29 NOTE — Assessment & Plan Note (Signed)
[facility]                            CARDIOLOGY OFFICE NOTE   NAME:[name], [name]                          MRN:          [id number]  DATE:04/15/2008                            DOB:          [birth year]    PRIMARY CARE PHYSICIAN:  [name] A. Gerda Diss, MD   [name] [name] returns today for followup.  He is doing fairly well.  Unfortunately, he has gained quite a bit of weight.  We talked about  this at length in regards to his carbohydrate intake.   He has a distant history of inferior wall MI and angioplasty without  stent by Dr. Juanda [name] in 1999.  I do not have these records.  He had been  followed by Dr. Earna [name] in [place], and I started seeing him last year.  He since has done well while seen in the cath lab.   He has had some issues with his family.  He had a son die prematurely  and has one with premature Parkinson disease.  However, he is retired.  He enjoys skeet shooting and doing outdoor activities.  He understands  the importance of losing weight in regards to risk factor modification.  When I last saw him, he had a stress Myoview, and this was done on December 14, 2007.  He exercised for 7 minutes and 30 seconds.  He was a bit  hypertensive in this response with a systolic blood pressure of 187.  He  had a small inferior wall infarction with mild peri-infarct ischemia.  It was thought to be a low-risk study, and since he was not having  angina, I thought medical therapy was warranted.  I talked to Dr. Alden [name]  today about resuming beta-blockers.  He was a little leery about this.  He tends to be prone towards depression and fatigue.  He also is in his  healthcare policy now.  However, I told him I thought a Coreg 6.25  b.i.d. would be a generic drug that he could take and tolerate well.  I  think, since he does have an abnormal Myoview and known coronary artery  disease without a stent, that aspirin therapy with beta-blocker  indicated.   The patient has  otherwise been compliant with his meds.  He is not  having any significant angina.   ALLERGIES:  He is allergic to PENICILLIN.   MEDICATIONS:  1. Lovastatin 40 a day.  2. Nexium 40 a day.  3. Aspirin.  4. Zyrtec.  5. Omega vitamins.   There is a distant history of dilatation of the aorta.  As I recall, he  had an ultrasound this past year, which showed a 3 x 3.1 AAA, which  probably needs followup in 2 years.   PHYSICAL EXAMINATION:  GENERAL:  Remarkable for an overweight white male  in no distress.  VITAL SIGNS:  His weight is up to 209, blood pressure 140/80, pulse 80  and regular, respiratory rate 14, and afebrile.  HEENT:  Unremarkable.  NECK:  Carotids are normal without bruit.  No lymphadenopathy.  No  thyromegaly without any JVP  elevation.  LUNGS:  Clear with good diaphragmatic motion.  No wheezing.  CARDIAC:  S1 and S2.  Normal heart sounds.  PMI normal.  ABDOMEN:  Benign.  Bowel sounds positive.  No AAA palpable.  No bruit.  No hepatosplenomegaly or hepatojugular reflux.  EXTREMITIES:  Distal pulses are intact.  No edema.  NEUROLOGIC:  Nonfocal.  SKIN:  Warm and dry.  MUSCULOSKELETAL:  No muscular weakness.   IMPRESSION:  1. Coronary artery disease, distant history of an inferior myocardial      infarction with angioplasty, low-risk Myoview.  Continue aspirin.      I will add Coreg for beta-blockade.  2. Hypercholesterolemia.  Continue lovastatin.  Lipid and liver      profile in 6 months.  His last LDL cholesterol done on November 28, 2007 was 75, which is reasonable.  3. History of gastroesophageal reflux disease and reflux.  Continue      Nexium.  Weight loss will be important.  4. Seasonal allergies.  Continue Zyrtec.  5. Abdominal aortic aneurysm, relatively small in size.  Followup      abdominal ultrasound in a year.   The patient will call me if he gets any angina.  He is fairly active and  has a previous warning system.  [facility]lingual nitroglycerin [facility]  [facility] [place].     [name] Pick. Eden Emms, MD, [facility]  Electronically Signed    PCN/MedQ  DD: 04/15/2008  DT: 04/16/2008  Job #: [id number]

## 2010-09-29 NOTE — Assessment & Plan Note (Signed)
[facility]                            CARDIOLOGY OFFICE NOTE   NAME:LYNNKathleen, Likins                        MRN:          [id number]  DATE:01/26/2007                            DOB:          Apr 18, 1941    Mr. Giraud is seen as a new patient, I had previously seen the patient  back in 1999.   He has had a previous angioplasty in 1999 by Dr. Juanda Chance, unfortunately I  do not have his records at this time.  Subsequently, the patient had to  transfer his care to Dr. Earna Coder in [place].  The patient use to work  at [employer] at [facility] and his insurance required  that he be seen up there, however he lives in [place] and recently  had a change in insurance and wanted to be seen back down here in the  [place] office.   In talking to the patient he has not had any significant chest pain.  He  says he had a stress test at Dr. Albertina Senegal office last year and it was  okay.  He is fairly active, he has not had any significant exertional  chest pain.  There has been occasional exertional dyspnea, it sounds  functional.  He used to smoke cigars and has a little bit of COPD.  He  quit a few years ago and does not use inhalers.   He also has some previous evidence of PVD with an abdominal aortic  aneurysm.  I have a CT scan from [place] in March that showed his  aneurysm to be 3.2 x 3.4 cm which apparently is stable.   The patient has been under a little bit of stress lately.  He has had  quite a bit of bad luck in regards to his children.  He has one daughter  who is healthy but he has lost 1 son to death already and has a 46-year-  old son who had a cleft palate, had a CVA and now may have premature  Parkinson's disease.   The patient thinks that he has been feeling somewhat fatigued and  stressed over this lately.  His blood pressure appears to have been high  recently.   I talked to him at length about this, he does not really follow a  low  salt diet.  I explained to him that I thought the stress could be  exacerbating this but given his known coronary disease and AAA I told  him that we should watch this closely.  He will start to monitor his  blood pressure at home at least 3 days a week.  I will see him back in 8  weeks and we will reassess his need for either beta blockade or ACE  inhibitor.   PAST MEDICAL HISTORY:  Otherwise remarkable for:  1. Reflux.  2. Hypercholesterolemia.  3. Previous angioplasty in 1999, records not available.  4. Previous smoking with question of COPD.  5. Seasonal allergies.  6. He has had his gallbladder taken out in [place] in 2000.  7. He has had multiple lumbar  laminectomies by Dr. Krista Blue in [place].  8. Detached retina in 2003, worked on by Dr. Shon Baton at [facility].  9. Melanoma in 2004 on his chin.   PRIMARY CARE PHYSICIAN:  [name].   REVIEW OF SYSTEMS:  Otherwise negative.   DRUG ALLERGIES:  PENICILLIN, TETRACYCLINE, NEOMYCIN, __________  DEXAMETHASONE 4 MG A DAY. METHOCARBAMOL 750 A DAY.   MEDICATIONS:  1. Nexium 40 a day.  2. Lovastatin 40 a day.  3. Clindamycin.  4. Librax for spastic colon.  5. An aspirin a day.  6. Zyrtec 30 a day.  7. Singulair 10 a day.  8. Albuterol 90 mcg as needed.   The patient is happily married as indicated.  He has had 3 children, 2  who are living and 1 who may have premature Parkinson's.  He is a  previous smoker and stopped a few years ago.  He enjoys shooting plays  in [place] and is otherwise fairly active with his walking.   FAMILY HISTORY:  Noncontributory.   EXAMINATION:  Remarkable for a healthy-appearing, elderly white male in  no distress.  Blood pressure is a little bit elevated at 150/85, weight  is 209, pulse 74 and regular, afebrile, respiratory rate is 14  HEENT:  Normal.  Carotids normal without bruit.  There is no  lymphadenopathy, no thyromegaly and no JVP elevation.  LUNGS:  Clear, good diaphragmatic motion,  no wheezing.  There is an S1 and S2 with normal heart sounds, PMI is normal.  Bowel  sounds are positive, no hepatosplenomegaly, no hepatojugular reflux.  He  is status post cholecystectomy.  __________ AAA, no renal bruits.  Femorals are +3 bilaterally.  I can not feel his abdominal aortic  aneurysm.  Distal pulses are intact with no edema.  NEURO:  Nonfocal.  SKIN:  Warm and dry.  There is no muscular weakness.   His electrocardiogram shows sinus rhythm with borderline LVH and no  previous infarction.   IMPRESSION:  1. History of coronary disease, previous angioplasty in 1999.  I will      have to review these records from [facility].  We also had him sign a      release form so I can get the results of his stress test from Dr.      Earna Coder last year.  He is fairly asymptomatic and I do not see the      need to repeat any testing at this time.  He will continue his      current medications.  2. Hypertension, I suspect he will need to have either a beta blocker      or an angiotensin converting enzyme inhibitor added to his regimen      given his known coronary artery disease and abdominal aortic      aneurysm.  I suspect that this will be important, however he would      like to monitor his blood pressure and I will see him back in 8      weeks up in [place].  3. Hypercholesterolemia in the setting of previous angioplasty,      continue lovastatin, try to get results from lipid and liver      profile from either Dr. Earna Coder or Dr. [name].  4. History of chronic obstructive pulmonary disease clinically and on      medications.  Again, I do not know if formal pulmonary function      tests have been done pre and post bronchodilator but I  would be      interested in these particularly in light of the fact that it may      be beneficial to start him on a beta blocker for his coronary      disease and abdominal aortic aneurysm.   Overall I think that Vir is doing okay.  He may  have had a recent  flare in his blood pressure due to the stress of his son's recent  diagnosis of premature Parkinson's.  I will see him up in [place] in  8 weeks to further assess his blood pressure.     Noralyn Pick. Eden Emms, MD, [facility]  Electronically Signed    PCN/MedQ  DD: 01/26/2007  DT: 01/27/2007  Job #: [id number]

## 2010-10-27 ENCOUNTER — Telehealth: Payer: Self-pay | Admitting: Cardiology

## 2010-10-27 NOTE — Telephone Encounter (Signed)
Patient is aware of his monitor results. He will see Dr. Eden Emms on 11/04/10.

## 2010-11-04 ENCOUNTER — Ambulatory Visit (INDEPENDENT_AMBULATORY_CARE_PROVIDER_SITE_OTHER): Payer: Medicare Other | Admitting: Cardiovascular Disease

## 2010-11-04 ENCOUNTER — Encounter: Payer: Self-pay | Admitting: Cardiovascular Disease

## 2010-11-04 DIAGNOSIS — E782 Mixed hyperlipidemia: Secondary | ICD-10-CM

## 2010-11-04 DIAGNOSIS — I491 Atrial premature depolarization: Secondary | ICD-10-CM

## 2010-11-04 DIAGNOSIS — I2510 Atherosclerotic heart disease of native coronary artery without angina pectoris: Secondary | ICD-10-CM

## 2010-11-04 DIAGNOSIS — I119 Hypertensive heart disease without heart failure: Secondary | ICD-10-CM

## 2010-11-04 MED ORDER — METOPROLOL SUCCINATE ER 25 MG PO TB24: 25.0000 mg | ORAL_TABLET | Freq: Every day | ORAL | Status: DC

## 2010-11-04 MED ORDER — NITROGLYCERIN 0.4 MG SL SUBL: 0.4000 mg | SUBLINGUAL_TABLET | SUBLINGUAL | Status: DC | PRN

## 2010-11-04 NOTE — Patient Instructions (Addendum)
Your physician recommends that you schedule a follow-up appointment in: 6 MONTHS WITH DR [facility]  Your physician has recommended you make the following change in your medication: START TOPROL 25 MG 1 EVERY DAY

## 2010-11-04 NOTE — Assessment & Plan Note (Signed)
Well controlled.  Continue current medications and low sodium Dash type diet.    

## 2010-11-04 NOTE — Assessment & Plan Note (Signed)
Cholesterol is at goal.  Continue current dose of statin and diet Rx.  No myalgias or side effects.  F/U  LFT's in 6 months. Lab Results  Component Value Date   LDLCALC 83 12/02/2008             

## 2010-11-04 NOTE — Progress Notes (Signed)
[name] [name] is seen today in followup for his coronary artery disease. 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 Juanda [name] I believe back in 1999. His last Myoview in July of 2009 was nonischemic. He has good LV function. His been compliant with his meds. He has a 3 x 3.2 cm AAA which needs a followup in a year. He has not had t any abdominal pain. He is active. 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 He prefers to have his duplex at [facility]. He continues to see [name] [name] for his primary care needs.  Continues to have issues with sinuses. Episodes of "sleepyness" No palpitations but frequent PAC;s in office. Dr [name] [name] concerned about :syncope. Low risk for ventricular arrhythmias with good EF and no angina.   Reviewed event monitor from 5/9-6/7  PAC;s and a few short bursts of SVT 4 beats.  Will try low dose Toprol as his COPD does not appear to have a large asthmatic component  Nitro also to be called in  ROS: Denies fever, malais, weight loss, blurry vision, decreased visual acuity, cough, sputum, SOB, hemoptysis, pleuritic pain, palpitaitons, heartburn, abdominal pain, melena, lower extremity edema, claudication, or rash.  All other systems reviewed and negative  General: Affect appropriate Healthy:  appears stated age HEENT: normal Neck supple with no adenopathy JVP normal no bruits no thyromegaly Lungs clear with no wheezing and good diaphragmatic motion Heart:  S1/S2 no murmur,rub, gallop or click PMI normal Abdomen: benighn, BS positve, no tenderness, no AAA no bruit.  No HSM or HJR Distal pulses intact with no bruits No edema Neuro non-focal Skin warm and dry No muscular weakness   Current Outpatient Prescriptions  Medication Sig Dispense Refill  . albuterol (PROVENTIL) (2.5 MG/3ML) 0.083% nebulizer solution Take 2.5 mg by nebulization every 6 (six) hours as needed.        Marland Kitchen aspirin 81  MG tablet Take 81 mg by mouth daily.        . cetirizine (ZYRTEC) 10 MG chewable tablet Chew 10 mg by mouth daily.        Marland Kitchen esomeprazole (NEXIUM) 40 MG capsule Take 40 mg by mouth daily before breakfast.        . fish oil-omega-3 fatty acids 1000 MG capsule Take 2 g by mouth daily.       Marland Kitchen lovastatin (MEVACOR) 40 MG tablet Take 40 mg by mouth at bedtime.        . montelukast (SINGULAIR) 10 MG tablet Take 10 mg by mouth at bedtime.        . Multiple Vitamin (MULTIVITAMIN) tablet Take 1 tablet by mouth daily.        . nitroGLYCERIN (NITROSTAT) 0.4 MG SL tablet Place 1 tablet (0.4 mg total) under the tongue every 5 (five) minutes as needed.  90 tablet  3  . DISCONTD: nitroGLYCERIN (NITROSTAT) 0.4 MG SL tablet Place 0.4 mg under the tongue every 5 (five) minutes as needed.        . metoprolol succinate (TOPROL XL) 25 MG 24 hr tablet Take 1 tablet (25 mg total) by mouth daily.  30 tablet  11    Allergies  Ciprofloxacin; Penicillins; and Tetracyclines & related  Electrocardiogram:  Assessment and Plan

## 2010-11-04 NOTE — Assessment & Plan Note (Signed)
Start beta blocker  F/U in 6 months

## 2010-11-04 NOTE — Assessment & Plan Note (Signed)
Stable with no angina and good activity level.  Continue medical Rx  

## 2010-11-13 ENCOUNTER — Other Ambulatory Visit: Payer: Self-pay | Admitting: Cardiovascular Disease

## 2010-11-13 DIAGNOSIS — I714 Abdominal aortic aneurysm, without rupture: Secondary | ICD-10-CM

## 2010-11-20 ENCOUNTER — Ambulatory Visit (HOSPITAL_COMMUNITY)
Admission: RE | Admit: 2010-11-20 | Discharge: 2010-11-20 | Disposition: A | Discharge status: Home / Self Care | Payer: Medicare Other | Source: Ambulatory Visit | Attending: Cardiovascular Disease | Admitting: Cardiovascular Disease

## 2010-11-20 DIAGNOSIS — I714 Abdominal aortic aneurysm, without rupture, unspecified: Secondary | ICD-10-CM | POA: Insufficient documentation

## 2011-05-20 DIAGNOSIS — L570 Actinic keratosis: Secondary | ICD-10-CM | POA: Diagnosis not present

## 2011-05-20 DIAGNOSIS — D239 Other benign neoplasm of skin, unspecified: Secondary | ICD-10-CM | POA: Diagnosis not present

## 2011-05-20 DIAGNOSIS — Z85828 Personal history of other malignant neoplasm of skin: Secondary | ICD-10-CM | POA: Diagnosis not present

## 2011-05-20 DIAGNOSIS — D485 Neoplasm of uncertain behavior of skin: Secondary | ICD-10-CM | POA: Diagnosis not present

## 2011-06-21 DIAGNOSIS — H4010X0 Unspecified open-angle glaucoma, stage unspecified: Secondary | ICD-10-CM | POA: Diagnosis not present

## 2011-06-22 DIAGNOSIS — K2100 Gastro-esophageal reflux disease with esophagitis, without bleeding: Secondary | ICD-10-CM | POA: Diagnosis not present

## 2011-06-22 DIAGNOSIS — R141 Gas pain: Secondary | ICD-10-CM | POA: Diagnosis not present

## 2011-06-22 DIAGNOSIS — R1013 Epigastric pain: Secondary | ICD-10-CM | POA: Diagnosis not present

## 2011-06-28 DIAGNOSIS — H4010X0 Unspecified open-angle glaucoma, stage unspecified: Secondary | ICD-10-CM | POA: Diagnosis not present

## 2011-07-06 DIAGNOSIS — K2970 Gastritis, unspecified, without bleeding: Secondary | ICD-10-CM | POA: Diagnosis not present

## 2011-07-06 DIAGNOSIS — K2990 Gastroduodenitis, unspecified, without bleeding: Secondary | ICD-10-CM | POA: Diagnosis not present

## 2011-07-06 DIAGNOSIS — R109 Unspecified abdominal pain: Secondary | ICD-10-CM | POA: Diagnosis not present

## 2011-07-06 DIAGNOSIS — K2270 Barrett's esophagus without dysplasia: Secondary | ICD-10-CM | POA: Diagnosis not present

## 2011-07-06 DIAGNOSIS — I252 Old myocardial infarction: Secondary | ICD-10-CM | POA: Diagnosis not present

## 2011-07-06 DIAGNOSIS — K219 Gastro-esophageal reflux disease without esophagitis: Secondary | ICD-10-CM | POA: Diagnosis not present

## 2011-07-06 DIAGNOSIS — K2100 Gastro-esophageal reflux disease with esophagitis, without bleeding: Secondary | ICD-10-CM | POA: Diagnosis not present

## 2011-07-06 DIAGNOSIS — K2980 Duodenitis without bleeding: Secondary | ICD-10-CM | POA: Diagnosis not present

## 2011-07-13 ENCOUNTER — Ambulatory Visit (INDEPENDENT_AMBULATORY_CARE_PROVIDER_SITE_OTHER): Payer: Medicare Other | Admitting: Cardiovascular Disease

## 2011-07-13 DIAGNOSIS — I2510 Atherosclerotic heart disease of native coronary artery without angina pectoris: Secondary | ICD-10-CM

## 2011-07-13 DIAGNOSIS — I491 Atrial premature depolarization: Secondary | ICD-10-CM

## 2011-07-13 DIAGNOSIS — R030 Elevated blood-pressure reading, without diagnosis of hypertension: Secondary | ICD-10-CM

## 2011-07-13 DIAGNOSIS — E782 Mixed hyperlipidemia: Secondary | ICD-10-CM | POA: Diagnosis not present

## 2011-07-13 DIAGNOSIS — I714 Abdominal aortic aneurysm, without rupture: Secondary | ICD-10-CM

## 2011-07-13 NOTE — Assessment & Plan Note (Signed)
Patient to ge [employer] BP monitor Consider beta blocker for BP if high at home

## 2011-07-13 NOTE — Assessment & Plan Note (Signed)
Benign and asymptomatic  Observe  Patient felt no different on beta blocker

## 2011-07-13 NOTE — Assessment & Plan Note (Signed)
F/U duplex in 6 months 

## 2011-07-13 NOTE — Assessment & Plan Note (Signed)
Cholesterol is at goal.  Continue current dose of statin and diet Rx.  No myalgias or side effects.  F/U  LFT's in 6 months. Lab Results  Component Value Date   LDLCALC 83 12/02/2008             

## 2011-07-13 NOTE — Assessment & Plan Note (Signed)
Stable with no angina and good activity level.  Continue medical Rx  

## 2011-07-13 NOTE — Progress Notes (Signed)
[name] [name] is seen today in followup for his coronary artery disease. 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 Juanda Chance I believe back in 1999. His last Myoview in July of 2009 was nonischemic. He has good LV function. His been compliant with his meds. He has a 3 x 3.2 cm AAA which needs a followup in a year. He has not had t any abdominal pain. He is active. 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 He prefers to have his duplex at [facility]. He continues to see [name] for his primary care needs.  Continues to have issues with sinuses. Episodes of "sleepyness" No palpitations but frequent PAC;s in office. Dr Gerda Diss concerned about :syncope. Low risk for ventricular arrhythmias with good EF and no angina.  Reviewed event monitor from 5/9-10/22/10  PAC;s and a few short bursts of SVT 4 beats. Low dose Toprol started last visit in June. Patient felt no different on it and stopped it.  No palpitations.  ROS: Denies fever, malais, weight loss, blurry vision, decreased visual acuity, cough, sputum, SOB, hemoptysis, pleuritic pain, palpitaitons, heartburn, abdominal pain, melena, lower extremity edema, claudication, or rash.  All other systems reviewed and negative  General: Affect appropriate Healthy:  appears stated age HEENT: normal Neck supple with no adenopathy JVP normal no bruits no thyromegaly Lungs clear with no wheezing and good diaphragmatic motion Heart:  S1/S2 no murmur, no rub, gallop or click PMI normal Abdomen: benighn, BS positve, no tenderness, no AAA no bruit.  No HSM or HJR Distal pulses intact with no bruits No edema Neuro non-focal Skin warm and dry No muscular weakness   Current Outpatient Prescriptions  Medication Sig Dispense Refill  . albuterol (PROVENTIL) (2.5 MG/3ML) 0.083% nebulizer solution Take 2.5 mg by nebulization every 6 (six) hours as needed.        Marland Kitchen aspirin 81 MG  tablet Take 81 mg by mouth daily.        . cetirizine (ZYRTEC) 10 MG chewable tablet Chew 10 mg by mouth daily.        Marland Kitchen esomeprazole (NEXIUM) 40 MG capsule Take 40 mg by mouth daily before breakfast.        . fish oil-omega-3 fatty acids 1000 MG capsule Take 2 g by mouth daily.       Marland Kitchen lovastatin (MEVACOR) 40 MG tablet Take 40 mg by mouth at bedtime.        . montelukast (SINGULAIR) 10 MG tablet Take 10 mg by mouth as needed.       . Multiple Vitamin (MULTIVITAMIN) tablet Take 1 tablet by mouth daily.        . nitroGLYCERIN (NITROSTAT) 0.4 MG SL tablet Place 1 tablet (0.4 mg total) under the tongue every 5 (five) minutes as needed.  90 tablet  3  . ranitidine (ZANTAC) 150 MG tablet Take 1 tablet by mouth Daily.        Allergies  Ciprofloxacin; Penicillins; and Tetracyclines & related  Electrocardiogram:  Assessment and Plan

## 2011-07-13 NOTE — Patient Instructions (Signed)
Your physician wants you to follow-up in:  6 months. You will receive a reminder letter in the mail two months in advance. If you don't receive a letter, please call our office to schedule the follow-up appointment.   

## 2011-07-22 DIAGNOSIS — K5732 Diverticulitis of large intestine without perforation or abscess without bleeding: Secondary | ICD-10-CM | POA: Diagnosis not present

## 2011-07-22 DIAGNOSIS — R109 Unspecified abdominal pain: Secondary | ICD-10-CM | POA: Diagnosis not present

## 2011-07-22 DIAGNOSIS — K2100 Gastro-esophageal reflux disease with esophagitis, without bleeding: Secondary | ICD-10-CM | POA: Diagnosis not present

## 2011-07-22 DIAGNOSIS — K2270 Barrett's esophagus without dysplasia: Secondary | ICD-10-CM | POA: Diagnosis not present

## 2011-08-09 DIAGNOSIS — R1031 Right lower quadrant pain: Secondary | ICD-10-CM | POA: Diagnosis not present

## 2011-08-09 DIAGNOSIS — R1011 Right upper quadrant pain: Secondary | ICD-10-CM | POA: Diagnosis not present

## 2011-08-10 DIAGNOSIS — R109 Unspecified abdominal pain: Secondary | ICD-10-CM | POA: Diagnosis not present

## 2011-11-22 DIAGNOSIS — M19049 Primary osteoarthritis, unspecified hand: Secondary | ICD-10-CM | POA: Diagnosis not present

## 2011-12-22 DIAGNOSIS — J42 Unspecified chronic bronchitis: Secondary | ICD-10-CM | POA: Diagnosis not present

## 2011-12-22 DIAGNOSIS — J449 Chronic obstructive pulmonary disease, unspecified: Secondary | ICD-10-CM | POA: Diagnosis not present

## 2011-12-23 DIAGNOSIS — M47812 Spondylosis without myelopathy or radiculopathy, cervical region: Secondary | ICD-10-CM | POA: Diagnosis not present

## 2011-12-23 DIAGNOSIS — G5600 Carpal tunnel syndrome, unspecified upper limb: Secondary | ICD-10-CM | POA: Diagnosis not present

## 2011-12-30 DIAGNOSIS — M5412 Radiculopathy, cervical region: Secondary | ICD-10-CM | POA: Diagnosis not present

## 2011-12-30 DIAGNOSIS — G5600 Carpal tunnel syndrome, unspecified upper limb: Secondary | ICD-10-CM | POA: Diagnosis not present

## 2012-01-12 DIAGNOSIS — L570 Actinic keratosis: Secondary | ICD-10-CM | POA: Diagnosis not present

## 2012-01-12 DIAGNOSIS — D485 Neoplasm of uncertain behavior of skin: Secondary | ICD-10-CM | POA: Diagnosis not present

## 2012-01-13 ENCOUNTER — Ambulatory Visit: Payer: Medicare Other | Admitting: Cardiovascular Disease

## 2012-01-19 ENCOUNTER — Other Ambulatory Visit: Payer: Self-pay | Admitting: Cardiovascular Disease

## 2012-01-19 ENCOUNTER — Ambulatory Visit (INDEPENDENT_AMBULATORY_CARE_PROVIDER_SITE_OTHER): Payer: Medicare Other | Admitting: Cardiovascular Disease

## 2012-01-19 VITALS — BP 152/78 | HR 80 | Ht 73.0 in | Wt 201.8 lb

## 2012-01-19 DIAGNOSIS — E782 Mixed hyperlipidemia: Secondary | ICD-10-CM

## 2012-01-19 DIAGNOSIS — I714 Abdominal aortic aneurysm, without rupture: Secondary | ICD-10-CM

## 2012-01-19 DIAGNOSIS — I2510 Atherosclerotic heart disease of native coronary artery without angina pectoris: Secondary | ICD-10-CM | POA: Diagnosis not present

## 2012-01-19 DIAGNOSIS — K219 Gastro-esophageal reflux disease without esophagitis: Secondary | ICD-10-CM | POA: Insufficient documentation

## 2012-01-19 NOTE — Assessment & Plan Note (Signed)
No change on exam  F/U duplex at [facility]

## 2012-01-19 NOTE — Progress Notes (Signed)
Patient ID: [name] [name], male   DOB: Oct 02, 1940, 71 y.o.   MRN: [id number] [name] [name] is seen today in followup for his coronary artery disease. 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 Juanda [name] I believe back in 1999. His last Myoview in July of 2009 was nonischemic. He has good LV function. His been compliant with his meds. He has a 3 x 3.2 cm AAA which needs a followup in a year. He has not had t any abdominal pain. He is active. 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 He prefers to have his duplex at [facility]. He continues to see [name] [name] for his primary care needs.  Continues to have issues with sinuses. Episodes of "sleepyness" No palpitations but frequent PAC;s in office. Dr [name] [name] concerned about :syncope. Low risk for ventricular arrhythmias with good EF and no angina.   Reviewed event monitor from 5/9-10/22/10 PAC;s and a few short bursts of SVT 4 beats. Low dose Toprol started last visit in June. Patient felt no different on it and stopped it. No palpitations.   ROS: Denies fever, malais, weight loss, blurry vision, decreased visual acuity, cough, sputum, SOB, hemoptysis, pleuritic pain, palpitaitons, heartburn, abdominal pain, melena, lower extremity edema, claudication, or rash.  All other systems reviewed and negative  General: Affect appropriate Healthy:  appears stated age HEENT: normal Neck supple with no adenopathy JVP normal no bruits no thyromegaly Lungs clear with no wheezing and good diaphragmatic motion Heart:  S1/S2 no murmur, no rub, gallop or click PMI normal Abdomen: benighn, BS positve, no tenderness, no AAA no bruit.  No HSM or HJR Distal pulses intact with no bruits No edema Neuro non-focal Skin warm and dry No muscular weakness   Current Outpatient Prescriptions  Medication Sig Dispense Refill  . albuterol (PROVENTIL) (2.5 MG/3ML) 0.083% nebulizer solution Take  2.5 mg by nebulization every 6 (six) hours as needed.        Marland Kitchen aspirin 81 MG tablet Take 81 mg by mouth daily.        . cetirizine (ZYRTEC) 10 MG chewable tablet Chew 10 mg by mouth daily.        Marland Kitchen esomeprazole (NEXIUM) 40 MG capsule Take 40 mg by mouth daily before breakfast.        . fish oil-omega-3 fatty acids 1000 MG capsule Take 2 g by mouth daily.       Marland Kitchen lovastatin (MEVACOR) 40 MG tablet Take 40 mg by mouth at bedtime.        . montelukast (SINGULAIR) 10 MG tablet Take 10 mg by mouth as needed.       . Multiple Vitamin (MULTIVITAMIN) tablet Take 1 tablet by mouth daily.        . nitroGLYCERIN (NITROSTAT) 0.4 MG SL tablet Place 1 tablet (0.4 mg total) under the tongue every 5 (five) minutes as needed.  90 tablet  3  . ranitidine (ZANTAC) 150 MG tablet Take 1 tablet by mouth Daily.        Allergies  Ciprofloxacin; Penicillins; and Tetracyclines & related  Electrocardiogram:  Assessment and Plan

## 2012-01-19 NOTE — Assessment & Plan Note (Signed)
Stable with no angina and good activity level.  Continue medical Rx  

## 2012-01-19 NOTE — Patient Instructions (Addendum)
Your physician wants you to follow-up in:  6 MONTHS WITH DR Haywood Filler will receive a reminder letter in the mail two months in advance. If you don't receive a letter, please call our office to schedule the follow-up appointment. Your physician recommends that you continue on your current medications as directed. Please refer to the Current Medication list given to you today. Your physician has requested that you have an abdominal aorta duplex. During this test, an ultrasound is used to evaluate the aorta. Allow 30 minutes for this exam. Do not eat after midnight the day before and avoid carbonated beverages DX AAA  DO TEST AT [facility]

## 2012-01-19 NOTE — Assessment & Plan Note (Signed)
Had recent EGD in [place] with hiatal hernia.  On PPI and H2 bocker.  Low carb diet

## 2012-01-19 NOTE — Assessment & Plan Note (Signed)
Cholesterol is at goal.  Continue current dose of statin and diet Rx.  No myalgias or side effects.  F/U  LFT's in 6 months. Lab Results  Component Value Date   LDLCALC 83 12/02/2008             

## 2012-01-20 DIAGNOSIS — Z125 Encounter for screening for malignant neoplasm of prostate: Secondary | ICD-10-CM | POA: Diagnosis not present

## 2012-01-20 DIAGNOSIS — Z79899 Other long term (current) drug therapy: Secondary | ICD-10-CM | POA: Diagnosis not present

## 2012-01-20 DIAGNOSIS — E782 Mixed hyperlipidemia: Secondary | ICD-10-CM | POA: Diagnosis not present

## 2012-01-25 ENCOUNTER — Other Ambulatory Visit (HOSPITAL_COMMUNITY): Payer: Medicare Other

## 2012-01-25 ENCOUNTER — Ambulatory Visit (HOSPITAL_COMMUNITY)
Admission: RE | Admit: 2012-01-25 | Discharge: 2012-01-25 | Disposition: A | Discharge status: Home / Self Care | Payer: Medicare Other | Source: Ambulatory Visit | Attending: Cardiovascular Disease | Admitting: Cardiovascular Disease

## 2012-01-25 DIAGNOSIS — I714 Abdominal aortic aneurysm, without rupture, unspecified: Secondary | ICD-10-CM | POA: Insufficient documentation

## 2012-01-25 DIAGNOSIS — Z09 Encounter for follow-up examination after completed treatment for conditions other than malignant neoplasm: Secondary | ICD-10-CM | POA: Diagnosis not present

## 2012-02-01 DIAGNOSIS — M47812 Spondylosis without myelopathy or radiculopathy, cervical region: Secondary | ICD-10-CM | POA: Diagnosis not present

## 2012-02-25 DIAGNOSIS — Z0000 Encounter for general adult medical examination without abnormal findings: Secondary | ICD-10-CM | POA: Diagnosis not present

## 2012-02-25 DIAGNOSIS — Z23 Encounter for immunization: Secondary | ICD-10-CM | POA: Diagnosis not present

## 2012-03-07 DIAGNOSIS — H25019 Cortical age-related cataract, unspecified eye: Secondary | ICD-10-CM | POA: Diagnosis not present

## 2012-03-07 DIAGNOSIS — H2510 Age-related nuclear cataract, unspecified eye: Secondary | ICD-10-CM | POA: Diagnosis not present

## 2012-03-07 DIAGNOSIS — H538 Other visual disturbances: Secondary | ICD-10-CM | POA: Diagnosis not present

## 2012-03-09 ENCOUNTER — Encounter (HOSPITAL_COMMUNITY): Payer: Self-pay | Admitting: Pharmacy Technician

## 2012-03-13 NOTE — Patient Instructions (Addendum)
20 [name] [name]  03/13/2012   Your procedure is scheduled on:  03/20/12  Report to [facility] at 06:15 AM.  Call this number if you have problems the morning of surgery: [phone]   Remember:   Do not eat or drink:After Midnight.  Take these medicines the morning of surgery with A SIP OF WATER: Nexium and Ranitidine.   Also, use your inhaler, Albuterol.   Do not wear jewelry, make-up or nail polish.  Do not wear lotions, powders, or perfumes. You may wear deodorant.  Do not shave 48 hours prior to surgery. Men may shave face and neck.  Do not bring valuables to the hospital.  Contacts, dentures or bridgework may not be worn into surgery.  Leave suitcase in the car. After surgery it may be brought to your room.   Patients discharged the day of surgery will not be allowed to drive home.   Special Instructions: Start your eye drops as directed by your eye doctor.   Please read over the following fact sheets that you were given: Pain Booklet, Anesthesia Post-op Instructions and Care and Recovery After Surgery    Cataract Surgery  A cataract is a clouding of the lens of the eye. When a lens becomes cloudy, vision is reduced based on the degree and nature of the clouding. Surgery may be needed to improve vision. Surgery removes the cloudy lens and usually replaces it with a substitute lens (intraocular lens, IOL). LET YOUR EYE DOCTOR KNOW ABOUT:  Allergies to food or medicine.  Medicines taken including herbs, eyedrops, over-the-counter medicines, and creams.  Use of steroids (by mouth or creams).  Previous problems with anesthetics or numbing medicine.  History of bleeding problems or blood clots.  Previous surgery.  Other health problems, including diabetes and kidney problems.  Possibility of pregnancy, if this applies. RISKS AND COMPLICATIONS  Infection.  Inflammation of the eyeball (endophthalmitis) that can spread to both eyes (sympathetic ophthalmia).  Poor wound  healing.  If an IOL is inserted, it can later fall out of proper position. This is very uncommon.  Clouding of the part of your eye that holds an IOL in place. This is called an "after-cataract." These are uncommon, but easily treated. BEFORE THE PROCEDURE  Do not eat or drink anything except small amounts of water for 8 to 12 before your surgery, or as directed by your caregiver.  Unless you are told otherwise, continue any eyedrops you have been prescribed.  Talk to your primary caregiver about all other medicines that you take (both prescription and non-prescription). In some cases, you may need to stop or change medicines near the time of your surgery. This is most important if you are taking blood-thinning medicine.Do not stop medicines unless you are told to do so.  Arrange for someone to drive you to and from the procedure.  Do not put contact lenses in either eye on the day of your surgery. PROCEDURE There is more than one method for safely removing a cataract. Your doctor can explain the differences and help determine which is best for you. Phacoemulsification surgery is the most common form of cataract surgery.  An injection is given behind the eye or eyedrops are given to make this a painless procedure.  A small cut (incision) is made on the edge of the clear, dome-shaped surface that covers the front of the eye (cornea).  A tiny probe is painlessly inserted into the eye. This device gives off ultrasound waves that soften  and break up the cloudy center of the lens. This makes it easier for the cloudy lens to be removed by suction.  An IOL may be implanted.  The normal lens of the eye is covered by a clear capsule. Part of that capsule is intentionally left in the eye to support the IOL.  Your surgeon may or may not use stitches to close the incision. There are other forms of cataract surgery that require a larger incision and stiches to close the eye. This approach is  taken in cases where the doctor feels that the cataract cannot be easily removed using phacoemulsification. AFTER THE PROCEDURE  When an IOL is implanted, it does not need care. It becomes a permanent part of your eye and cannot be seen or felt.  Your doctor will schedule follow-up exams to check on your progress.  Review your other medicines with your doctor to see which can be resumed after surgery.  Use eyedrops or take medicine as prescribed by your doctor. Document Released: 04/22/2011 Document Revised: 07/26/2011 Document Reviewed: 04/22/2011 [facility] Patient Information 2013 [employer] [employer], [place].   PATIENT INSTRUCTIONS POST-ANESTHESIA  IMMEDIATELY FOLLOWING SURGERY:  Do not drive or operate machinery for the first twenty four hours after surgery.  Do not make any important decisions for twenty four hours after surgery or while taking narcotic pain medications or sedatives.  If you develop intractable nausea and vomiting or a severe headache please notify your doctor immediately.  FOLLOW-UP:  Please make an appointment with your surgeon as instructed. You do not need to follow up with anesthesia unless specifically instructed to do so.  WOUND CARE INSTRUCTIONS (if applicable):  Keep a dry clean dressing on the anesthesia/puncture wound site if there is drainage.  Once the wound has quit draining you may leave it open to air.  Generally you should leave the bandage intact for twenty four hours unless there is drainage.  If the epidural site drains for more than 36-48 hours please call the anesthesia department.  QUESTIONS?:  Please feel free to call your physician or the hospital operator if you have any questions, and they will be happy to assist you.

## 2012-03-14 ENCOUNTER — Encounter (HOSPITAL_COMMUNITY): Payer: Self-pay

## 2012-03-14 ENCOUNTER — Encounter (HOSPITAL_COMMUNITY)
Admission: RE | Admit: 2012-03-14 | Discharge: 2012-03-14 | Disposition: A | Discharge status: Home / Self Care | Payer: Medicare Other | Source: Ambulatory Visit | Attending: Ophthalmology | Admitting: Ophthalmology

## 2012-03-14 DIAGNOSIS — Z01812 Encounter for preprocedural laboratory examination: Secondary | ICD-10-CM | POA: Diagnosis not present

## 2012-03-14 DIAGNOSIS — I10 Essential (primary) hypertension: Secondary | ICD-10-CM | POA: Diagnosis not present

## 2012-03-14 DIAGNOSIS — H2510 Age-related nuclear cataract, unspecified eye: Secondary | ICD-10-CM | POA: Diagnosis not present

## 2012-03-14 DIAGNOSIS — J449 Chronic obstructive pulmonary disease, unspecified: Secondary | ICD-10-CM | POA: Diagnosis not present

## 2012-03-14 HISTORY — DX: Benign prostatic hyperplasia without lower urinary tract symptoms: N40.0

## 2012-03-14 HISTORY — DX: Gastro-esophageal reflux disease without esophagitis: K21.9

## 2012-03-14 HISTORY — DX: Unspecified osteoarthritis, unspecified site: M19.90

## 2012-03-14 HISTORY — DX: Unspecified glaucoma: H40.9

## 2012-03-14 LAB — HEMOGLOBIN AND HEMATOCRIT, BLOOD
HCT: 42.5 % (ref 39.0–52.0)
Hemoglobin: 14.2 g/dL (ref 13.0–17.0)

## 2012-03-14 LAB — BASIC METABOLIC PANEL
BUN: 13 mg/dL (ref 6–23)
CO2: 30 mEq/L (ref 19–32)
Calcium: 9.6 mg/dL (ref 8.4–10.5)
Chloride: 100 mEq/L (ref 96–112)
Creatinine, Ser: 1 mg/dL (ref 0.50–1.35)
GFR calc Af Amer: 86 mL/min — ABNORMAL LOW (ref >90)
GFR calc non Af Amer: 74 mL/min — ABNORMAL LOW (ref >90)
Glucose, Bld: 134 mg/dL — ABNORMAL HIGH (ref 70–99)
Potassium: 4.3 mEq/L (ref 3.5–5.1)
Sodium: 138 mEq/L (ref 135–145)

## 2012-03-14 MED ORDER — CYCLOPENTOLATE-PHENYLEPHRINE 0.2-1 % OP SOLN
OPHTHALMIC | Status: AC
  Filled 2012-03-14: qty 2

## 2012-03-20 ENCOUNTER — Encounter (HOSPITAL_COMMUNITY)
Admission: RE | Disposition: A | Discharge status: Home / Self Care | Payer: Self-pay | Source: Ambulatory Visit | Attending: Ophthalmology

## 2012-03-20 ENCOUNTER — Ambulatory Visit (HOSPITAL_COMMUNITY): Payer: Medicare Other | Admitting: Anesthesiology

## 2012-03-20 ENCOUNTER — Encounter (HOSPITAL_COMMUNITY): Payer: Self-pay | Admitting: *Deleted

## 2012-03-20 ENCOUNTER — Encounter (HOSPITAL_COMMUNITY): Payer: Self-pay | Admitting: Anesthesiology

## 2012-03-20 ENCOUNTER — Ambulatory Visit (HOSPITAL_COMMUNITY)
Admission: RE | Admit: 2012-03-20 | Discharge: 2012-03-20 | Disposition: A | Discharge status: Home / Self Care | Payer: Medicare Other | Source: Ambulatory Visit | Attending: Ophthalmology | Admitting: Ophthalmology

## 2012-03-20 DIAGNOSIS — I10 Essential (primary) hypertension: Secondary | ICD-10-CM | POA: Diagnosis not present

## 2012-03-20 DIAGNOSIS — H269 Unspecified cataract: Secondary | ICD-10-CM | POA: Diagnosis not present

## 2012-03-20 DIAGNOSIS — H538 Other visual disturbances: Secondary | ICD-10-CM | POA: Diagnosis not present

## 2012-03-20 DIAGNOSIS — J4489 Other specified chronic obstructive pulmonary disease: Secondary | ICD-10-CM | POA: Insufficient documentation

## 2012-03-20 DIAGNOSIS — H2510 Age-related nuclear cataract, unspecified eye: Secondary | ICD-10-CM | POA: Diagnosis not present

## 2012-03-20 DIAGNOSIS — Z01812 Encounter for preprocedural laboratory examination: Secondary | ICD-10-CM | POA: Insufficient documentation

## 2012-03-20 DIAGNOSIS — J449 Chronic obstructive pulmonary disease, unspecified: Secondary | ICD-10-CM | POA: Insufficient documentation

## 2012-03-20 HISTORY — PX: CATARACT EXTRACTION W/PHACO: SHX586

## 2012-03-20 SURGERY — PHACOEMULSIFICATION, CATARACT, WITH IOL INSERTION
Anesthesia: Monitor Anesthesia Care | Site: Eye | Laterality: Right | Wound class: Clean

## 2012-03-20 MED ORDER — GATIFLOXACIN 0.5 % OP SOLN OPTIME - NO CHARGE
OPHTHALMIC | Status: DC | PRN
  Administered 2012-03-20: 1 [drp] via OPHTHALMIC

## 2012-03-20 MED ORDER — LIDOCAINE HCL 3.5 % OP GEL
OPHTHALMIC | Status: AC
  Filled 2012-03-20: qty 5

## 2012-03-20 MED ORDER — MIDAZOLAM HCL 2 MG/2ML IJ SOLN
1.0000 mg | INTRAMUSCULAR | Status: DC | PRN
  Administered 2012-03-20: 2 mg via INTRAVENOUS

## 2012-03-20 MED ORDER — LIDOCAINE 3.5 % OP GEL OPTIME - NO CHARGE
OPHTHALMIC | Status: DC | PRN
  Administered 2012-03-20: 2 [drp] via OPHTHALMIC

## 2012-03-20 MED ORDER — TETRACAINE 0.5 % OP SOLN OPTIME - NO CHARGE
OPHTHALMIC | Status: DC | PRN
  Administered 2012-03-20: 2 [drp] via OPHTHALMIC

## 2012-03-20 MED ORDER — GATIFLOXACIN 0.5 % OP SOLN OPTIME - NO CHARGE
1.0000 [drp] | Freq: Once | OPHTHALMIC | Status: AC
  Administered 2012-03-20: 1 [drp] via OPHTHALMIC
  Filled 2012-03-20: qty 2.5

## 2012-03-20 MED ORDER — MIDAZOLAM HCL 2 MG/2ML IJ SOLN
INTRAMUSCULAR | Status: AC
  Filled 2012-03-20: qty 2

## 2012-03-20 MED ORDER — KETOROLAC TROMETHAMINE 0.4 % OP SOLN - NO CHARGE
1.0000 [drp] | Freq: Once | OPHTHALMIC | Status: AC
  Administered 2012-03-20: 1 [drp] via OPHTHALMIC
  Filled 2012-03-20: qty 5

## 2012-03-20 MED ORDER — LACTATED RINGERS IV SOLN
INTRAVENOUS | Status: DC
  Administered 2012-03-20: 07:00:00 via INTRAVENOUS
  Administered 2012-03-20: 1000 mL via INTRAVENOUS

## 2012-03-20 MED ORDER — EPINEPHRINE HCL 1 MG/ML IJ SOLN
INTRAMUSCULAR | Status: AC
  Filled 2012-03-20: qty 1

## 2012-03-20 MED ORDER — ONDANSETRON HCL 4 MG/2ML IJ SOLN: 4.0000 mg | Freq: Once | INTRAMUSCULAR | Status: DC | PRN

## 2012-03-20 MED ORDER — TETRACAINE HCL 0.5 % OP SOLN
OPHTHALMIC | Status: AC
  Filled 2012-03-20: qty 2

## 2012-03-20 MED ORDER — NA HYALUR & NA CHOND-NA HYALUR 0.55-0.5 ML IO KIT
PACK | INTRAOCULAR | Status: DC | PRN
  Administered 2012-03-20: 1 via OPHTHALMIC

## 2012-03-20 MED ORDER — MIDAZOLAM HCL 5 MG/5ML IJ SOLN
INTRAMUSCULAR | Status: DC | PRN
  Administered 2012-03-20 (×2): 1 mg via INTRAVENOUS

## 2012-03-20 MED ORDER — EPINEPHRINE HCL 1 MG/ML IJ SOLN
INTRAOCULAR | Status: DC | PRN
  Administered 2012-03-20: 08:00:00

## 2012-03-20 MED ORDER — BSS IO SOLN
INTRAOCULAR | Status: DC | PRN
  Administered 2012-03-20: 15 mL via INTRAOCULAR

## 2012-03-20 MED ORDER — MOXIFLOXACIN HCL 0.5 % OP SOLN - NO CHARGE
1.0000 [drp] | Freq: Once | OPHTHALMIC | Status: DC
  Filled 2012-03-20: qty 3

## 2012-03-20 MED ORDER — CYCLOPENTOLATE-PHENYLEPHRINE 0.2-1 % OP SOLN
1.0000 [drp] | Freq: Once | OPHTHALMIC | Status: AC
  Administered 2012-03-20: 1 [drp] via OPHTHALMIC

## 2012-03-20 MED ORDER — FENTANYL CITRATE 0.05 MG/ML IJ SOLN: 25.0000 ug | INTRAMUSCULAR | Status: DC | PRN

## 2012-03-20 SURGICAL SUPPLY — 27 items
CAPSULAR TENSION RING-AMO (OPHTHALMIC RELATED) IMPLANT
CLOTH BEACON ORANGE TIMEOUT ST (SAFETY) ×2 IMPLANT
GLOVE BIO SURGEON STRL SZ7.5 (GLOVE) IMPLANT
GLOVE BIOGEL M 6.5 STRL (GLOVE) IMPLANT
GLOVE BIOGEL PI IND STRL 6.5 (GLOVE) ×1 IMPLANT
GLOVE BIOGEL PI IND STRL 7.0 (GLOVE) IMPLANT
GLOVE BIOGEL PI INDICATOR 6.5 (GLOVE) ×1
GLOVE BIOGEL PI INDICATOR 7.0 (GLOVE)
GLOVE ECLIPSE 6.5 STRL STRAW (GLOVE) IMPLANT
GLOVE ECLIPSE 7.5 STRL STRAW (GLOVE) IMPLANT
GLOVE EXAM NITRILE LRG STRL (GLOVE) IMPLANT
GLOVE EXAM NITRILE MD LF STRL (GLOVE) ×2 IMPLANT
GLOVE SKINSENSE NS SZ6.5 (GLOVE)
GLOVE SKINSENSE NS SZ7.0 (GLOVE)
GLOVE SKINSENSE STRL SZ6.5 (GLOVE) IMPLANT
GLOVE SKINSENSE STRL SZ7.0 (GLOVE) IMPLANT
INST SET CATARACT ~~LOC~~ (KITS) ×2 IMPLANT
KIT VITRECTOMY (OPHTHALMIC RELATED) IMPLANT
PAD ARMBOARD 7.5X6 YLW CONV (MISCELLANEOUS) ×2 IMPLANT
PROC W NO LENS (INTRAOCULAR LENS)
PROC W SPEC LENS (INTRAOCULAR LENS)
PROCESS W NO LENS (INTRAOCULAR LENS) IMPLANT
PROCESS W SPEC LENS (INTRAOCULAR LENS) IMPLANT
RING MALYGIN (MISCELLANEOUS) IMPLANT
SIGHTPATH CAT PROC W REG LENS (Ophthalmic Related) ×2 IMPLANT
VISCOELASTIC ADDITIONAL (OPHTHALMIC RELATED) IMPLANT
WATER STERILE IRR 250ML POUR (IV SOLUTION) ×2 IMPLANT

## 2012-03-20 NOTE — Anesthesia Preprocedure Evaluation (Signed)
Anesthesia Evaluation  Patient identified by MRN, date of birth, ID band Patient awake    Reviewed: Allergy & Precautions, H&P , NPO status , Patient's Chart, lab work & pertinent test results  Airway Mallampati: II      Dental  (+) Teeth Intact   Pulmonary COPDformer smoker,          Cardiovascular hypertension, Pt. on medications - angina+ CAD, + Past MI and + Peripheral Vascular Disease + dysrhythmias Rhythm:Regular Rate:Normal     Neuro/Psych    GI/Hepatic GERD-  Controlled and Medicated,  Endo/Other    Renal/GU      Musculoskeletal   Abdominal   Peds  Hematology   Anesthesia Other Findings   Reproductive/Obstetrics                           Anesthesia Physical Anesthesia Plan  ASA: III  Anesthesia Plan: MAC   Post-op Pain Management:    Induction: Intravenous  Airway Management Planned: Nasal Cannula  Additional Equipment:   Intra-op Plan:   Post-operative Plan:   Informed Consent: I have reviewed the patients History and Physical, chart, labs and discussed the procedure including the risks, benefits and alternatives for the proposed anesthesia with the patient or authorized representative who has indicated his/her understanding and acceptance.     Plan Discussed with:   Anesthesia Plan Comments:         Anesthesia Quick Evaluation  

## 2012-03-20 NOTE — Brief Op Note (Signed)
03/20/2012  11:46 AM  PATIENT:  [name]  71 y.o. male  PRE-OPERATIVE DIAGNOSIS:  nuclear cataract right eye  POST-OPERATIVE DIAGNOSIS:  nuclear cataract right eye  PROCEDURE:  Procedure(s): CATARACT EXTRACTION PHACO AND INTRAOCULAR LENS PLACEMENT (IOC)  SURGEON:  Surgeon(s): Susa Simmonds, MD  ASSISTANTS: Marya Landry, CST   ANESTHESIA STAFF: Franco Nones, CRNA - CRNA Laurene Footman, MD - Anesthesiologist  ANESTHESIA:   topical and MAC  REQUESTED LENS POWER: 17.0  LENS IMPLANT INFORMATION: [employer] SN60 WF  S/n [id number]  Exp 02/2013  CUMULATIVE DISSIPATED ENERGY:8.45  INDICATIONS:see H&P  OP FINDINGS:dense NS  COMPLICATIONS:None  DICTATION #: see scanned op note  PLAN OF CARE: see H&P  PATIENT DISPOSITION:  Short Stay

## 2012-03-20 NOTE — Anesthesia Postprocedure Evaluation (Signed)
  Anesthesia Post-op Note  Patient: [name] [name]  Procedure(s) Performed: Procedure(s) (LRB): CATARACT EXTRACTION PHACO AND INTRAOCULAR LENS PLACEMENT (IOC) (Right)  Patient Location:  Short Stay  Anesthesia Type: MAC  Level of Consciousness: awake  Airway and Oxygen Therapy: Patient Spontanous Breathing  Post-op Pain: none  Post-op Assessment: Post-op Vital signs reviewed, Patient's Cardiovascular Status Stable, Respiratory Function Stable, Patent Airway, No signs of Nausea or vomiting and Pain level controlled  Post-op Vital Signs: Reviewed and stable  Complications: No apparent anesthesia complications

## 2012-03-20 NOTE — H&P (Signed)
I have reviewed the pre printed H&P, the patient was re-examined, and I have identified no significant interval changes in the patient's medical condition.  There is no change in the plan of care since the history and physical of record. 

## 2012-03-20 NOTE — Op Note (Signed)
See scanned op note dated today  

## 2012-03-20 NOTE — Transfer of Care (Signed)
Immediate Anesthesia Transfer of Care Note  Patient: [name] [name]  Procedure(s) Performed: Procedure(s) (LRB): CATARACT EXTRACTION PHACO AND INTRAOCULAR LENS PLACEMENT (IOC) (Right)  Patient Location: Shortstay  Anesthesia Type: MAC  Level of Consciousness: awake  Airway & Oxygen Therapy: Patient Spontanous Breathing   Post-op Assessment: Report given to PACU RN, Post -op Vital signs reviewed and stable and Patient moving all extremities  Post vital signs: Reviewed and stable  Complications: No apparent anesthesia complications

## 2012-03-20 NOTE — Addendum Note (Signed)
Addendum  created 03/20/12 4540 by Franco Nones, CRNA   Modules edited:Anesthesia Flowsheet

## 2012-03-20 NOTE — Anesthesia Procedure Notes (Signed)
Procedure Name: MAC Date/Time: 03/20/2012 7:43 AM Performed by: Franco Nones Pre-anesthesia Checklist: Patient identified, Emergency Drugs available, Suction available, Timeout performed and Patient being monitored Patient Re-evaluated:Patient Re-evaluated prior to inductionOxygen Delivery Method: Nasal Cannula

## 2012-03-20 NOTE — OR Nursing (Signed)
Parmacist from [facility] called in regards to gatifloxacin, states pt. Is allergic to cipro,  Pt.  Used this drop at home and no problems have been noted.  Dr. Lita Mains informed.

## 2012-03-22 ENCOUNTER — Encounter (HOSPITAL_COMMUNITY): Payer: Self-pay | Admitting: Ophthalmology

## 2012-03-22 DIAGNOSIS — J988 Other specified respiratory disorders: Secondary | ICD-10-CM | POA: Diagnosis not present

## 2012-03-22 DIAGNOSIS — J42 Unspecified chronic bronchitis: Secondary | ICD-10-CM | POA: Diagnosis not present

## 2012-03-22 DIAGNOSIS — J019 Acute sinusitis, unspecified: Secondary | ICD-10-CM | POA: Diagnosis not present

## 2012-04-07 DIAGNOSIS — J988 Other specified respiratory disorders: Secondary | ICD-10-CM | POA: Diagnosis not present

## 2012-04-07 DIAGNOSIS — J449 Chronic obstructive pulmonary disease, unspecified: Secondary | ICD-10-CM | POA: Diagnosis not present

## 2012-05-31 ENCOUNTER — Other Ambulatory Visit: Payer: Self-pay | Admitting: Otolaryngology

## 2012-05-31 DIAGNOSIS — H9311 Tinnitus, right ear: Secondary | ICD-10-CM

## 2012-05-31 DIAGNOSIS — H903 Sensorineural hearing loss, bilateral: Secondary | ICD-10-CM | POA: Diagnosis not present

## 2012-05-31 DIAGNOSIS — H9319 Tinnitus, unspecified ear: Secondary | ICD-10-CM | POA: Diagnosis not present

## 2012-06-07 ENCOUNTER — Other Ambulatory Visit: Payer: Medicare Other

## 2012-06-08 DIAGNOSIS — M25569 Pain in unspecified knee: Secondary | ICD-10-CM | POA: Diagnosis not present

## 2012-06-15 ENCOUNTER — Ambulatory Visit
Admission: RE | Admit: 2012-06-15 | Discharge: 2012-06-15 | Disposition: A | Discharge status: Home / Self Care | Payer: Medicare Other | Source: Ambulatory Visit | Attending: Otolaryngology | Admitting: Otolaryngology

## 2012-06-15 ENCOUNTER — Other Ambulatory Visit: Payer: Medicare Other

## 2012-06-15 DIAGNOSIS — H9311 Tinnitus, right ear: Secondary | ICD-10-CM

## 2012-06-15 DIAGNOSIS — H9190 Unspecified hearing loss, unspecified ear: Secondary | ICD-10-CM | POA: Diagnosis not present

## 2012-06-15 MED ORDER — GADOBENATE DIMEGLUMINE 529 MG/ML IV SOLN
19.0000 mL | Freq: Once | INTRAVENOUS | Status: AC | PRN
  Administered 2012-06-15: 19 mL via INTRAVENOUS

## 2012-06-16 DIAGNOSIS — N419 Inflammatory disease of prostate, unspecified: Secondary | ICD-10-CM | POA: Diagnosis not present

## 2012-06-16 DIAGNOSIS — N390 Urinary tract infection, site not specified: Secondary | ICD-10-CM | POA: Diagnosis not present

## 2012-06-21 DIAGNOSIS — H9319 Tinnitus, unspecified ear: Secondary | ICD-10-CM | POA: Diagnosis not present

## 2012-06-21 DIAGNOSIS — H903 Sensorineural hearing loss, bilateral: Secondary | ICD-10-CM | POA: Diagnosis not present

## 2012-06-22 ENCOUNTER — Other Ambulatory Visit (HOSPITAL_COMMUNITY): Payer: Self-pay | Admitting: Orthopaedic Surgery

## 2012-06-22 DIAGNOSIS — M25569 Pain in unspecified knee: Secondary | ICD-10-CM | POA: Diagnosis not present

## 2012-06-22 DIAGNOSIS — M25562 Pain in left knee: Secondary | ICD-10-CM

## 2012-06-22 DIAGNOSIS — M25561 Pain in right knee: Secondary | ICD-10-CM

## 2012-06-28 ENCOUNTER — Ambulatory Visit (HOSPITAL_COMMUNITY)
Admission: RE | Admit: 2012-06-28 | Discharge: 2012-06-28 | Disposition: A | Discharge status: Home / Self Care | Payer: Medicare Other | Source: Ambulatory Visit | Attending: Orthopaedic Surgery | Admitting: Orthopaedic Surgery

## 2012-06-28 DIAGNOSIS — M25561 Pain in right knee: Secondary | ICD-10-CM

## 2012-06-28 DIAGNOSIS — M25569 Pain in unspecified knee: Secondary | ICD-10-CM | POA: Diagnosis not present

## 2012-06-28 DIAGNOSIS — M23305 Other meniscus derangements, unspecified medial meniscus, unspecified knee: Secondary | ICD-10-CM | POA: Diagnosis not present

## 2012-06-28 DIAGNOSIS — M23329 Other meniscus derangements, posterior horn of medial meniscus, unspecified knee: Secondary | ICD-10-CM | POA: Diagnosis not present

## 2012-07-01 ENCOUNTER — Other Ambulatory Visit: Payer: Self-pay

## 2012-07-03 DIAGNOSIS — M25569 Pain in unspecified knee: Secondary | ICD-10-CM | POA: Diagnosis not present

## 2012-07-13 DIAGNOSIS — H43399 Other vitreous opacities, unspecified eye: Secondary | ICD-10-CM | POA: Diagnosis not present

## 2012-07-17 ENCOUNTER — Encounter: Payer: Self-pay | Admitting: Cardiovascular Disease

## 2012-07-17 ENCOUNTER — Ambulatory Visit (INDEPENDENT_AMBULATORY_CARE_PROVIDER_SITE_OTHER): Payer: Medicare Other | Admitting: Cardiovascular Disease

## 2012-07-17 VITALS — BP 150/92 | HR 76 | Ht 73.0 in | Wt 208.0 lb

## 2012-07-17 DIAGNOSIS — R030 Elevated blood-pressure reading, without diagnosis of hypertension: Secondary | ICD-10-CM

## 2012-07-17 DIAGNOSIS — I714 Abdominal aortic aneurysm, without rupture: Secondary | ICD-10-CM | POA: Diagnosis not present

## 2012-07-17 DIAGNOSIS — E782 Mixed hyperlipidemia: Secondary | ICD-10-CM | POA: Diagnosis not present

## 2012-07-17 DIAGNOSIS — I2510 Atherosclerotic heart disease of native coronary artery without angina pectoris: Secondary | ICD-10-CM | POA: Diagnosis not present

## 2012-07-17 DIAGNOSIS — I491 Atrial premature depolarization: Secondary | ICD-10-CM

## 2012-07-17 MED ORDER — METOPROLOL SUCCINATE ER 50 MG PO TB24: 50.0000 mg | ORAL_TABLET | Freq: Every day | ORAL | Status: DC

## 2012-07-17 NOTE — Assessment & Plan Note (Signed)
Stable 3.6x 3.4  F/U duplex at [facility] 9/14

## 2012-07-17 NOTE — Assessment & Plan Note (Signed)
Cholesterol is at goal.  Continue current dose of statin and diet Rx.  No myalgias or side effects.  F/U  LFT's in 6 months. Lab Results  Component Value Date   LDLCALC 83 12/02/2008

## 2012-07-17 NOTE — Addendum Note (Signed)
Addended by: Scherrie Bateman E on: 07/17/2012 11:39 AM   Modules accepted: Orders

## 2012-07-17 NOTE — Addendum Note (Signed)
Addended by: Scherrie Bateman E on: 07/17/2012 11:51 AM   Modules accepted: Orders

## 2012-07-17 NOTE — Assessment & Plan Note (Signed)
Stable with no angina and good activity level.  Continue medical Rx  

## 2012-07-17 NOTE — Assessment & Plan Note (Signed)
Start Toprol.  Frequent on ECG today but he did not notice them

## 2012-07-17 NOTE — Patient Instructions (Signed)
Your physician wants you to follow-up in: YEAR WITH DR Haywood Filler will receive a reminder letter in the mail two months in advance. If you don't receive a letter, please call our office to schedule the follow-up appointment.  Your physician has recommended you make the following change in your medication: START  TOPROL 50 MG 1 TAB DAILY Your physician has requested that you have an abdominal aorta duplex. During this test, an ultrasound is used to evaluate the aorta. Allow 30 minutes for this exam. Do not eat after midnight the day before and avoid carbonated beverages DUE IN September

## 2012-07-17 NOTE — Assessment & Plan Note (Signed)
Resume toprol 50 mg should help slow progression of AAA and help with PACs/SVT

## 2012-07-17 NOTE — Progress Notes (Signed)
Patient ID: CONSTANT MANDEVILLE, male   DOB: 12-May-1941, 72 y.o.   MRN: [id number] [name] [name] is seen today in followup for his coronary artery disease. 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 Juanda Chance I believe back in 1999. His last Myoview in July of 2009 was nonischemic. He has good LV function. His been compliant with his meds. He has a 3.6  x 3.4 cm AAA which needs a followup duplex 9/14  He has not had t any abdominal pain. He is active. 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 He prefers to have his duplex at [facility]. He continues to see [name] [name] for his primary care needs.   Told him I would like him to take Toprol for HTN, AAA and PAC;s /SVT  Reviewed event monitor from 5/9-10/22/10 PAC;s and a few short bursts of SVT 4 beats. Low dose Toprol started last visit in June. Patient felt no different on it and stopped it. No palpitations.  ROS: Denies fever, malais, weight loss, blurry vision, decreased visual acuity, cough, sputum, SOB, hemoptysis, pleuritic pain, palpitaitons, heartburn, abdominal pain, melena, lower extremity edema, claudication, or rash.  All other systems reviewed and negative  General: Affect appropriate Healthy:  appears stated age HEENT: normal Neck supple with no adenopathy JVP normal no bruits no thyromegaly Lungs clear with no wheezing and good diaphragmatic motion Heart:  S1/S2 no murmur, no rub, gallop or click PMI normal Abdomen: benighn, BS positve, no tenderness, no AAA no bruit.  No HSM or HJR Distal pulses intact with no bruits No edema Neuro non-focal Skin warm and dry No muscular weakness   Current Outpatient Prescriptions  Medication Sig Dispense Refill  . aspirin EC 81 MG tablet Take 81 mg by mouth daily.      . cetirizine (ZYRTEC) 10 MG chewable tablet Chew 10 mg by mouth daily.        Marland Kitchen esomeprazole (NEXIUM) 40 MG capsule Take 40 mg by mouth daily  before breakfast.        . fish oil-omega-3 fatty acids 1000 MG capsule Take 2 g by mouth daily.       Marland Kitchen lovastatin (MEVACOR) 40 MG tablet Take 40 mg by mouth at bedtime.        . montelukast (SINGULAIR) 10 MG tablet Take 10 mg by mouth at bedtime as needed. Allergies.      . nitroGLYCERIN (NITROSTAT) 0.4 MG SL tablet Place 1 tablet (0.4 mg total) under the tongue every 5 (five) minutes as needed.  90 tablet  3   No current facility-administered medications for this visit.    Allergies  Gatifloxacin; Neomycin; Tetracyclines & related; Ciprofloxacin; and Penicillins  Electrocardiogram:  SR rate 80 frequent PAC;s otherwise normal  Assessment and Plan

## 2012-07-24 ENCOUNTER — Other Ambulatory Visit: Payer: Self-pay | Admitting: *Deleted

## 2012-07-24 ENCOUNTER — Telehealth: Payer: Self-pay | Admitting: Cardiovascular Disease

## 2012-07-24 MED ORDER — NEBIVOLOL HCL 5 MG PO TABS: 5.0000 mg | ORAL_TABLET | Freq: Every day | ORAL | Status: DC

## 2012-07-24 NOTE — Telephone Encounter (Signed)
Pt having trouble with troprol xl  pls call [phone] or after 930a at  [phone]

## 2012-07-24 NOTE — Telephone Encounter (Signed)
Pt is having side effect from Toprol  Medication. Pt is taken Toprol 50 mg once a day since 07/17/12 pt sates has been having SOB, nauseas, dizziness, diarrhea since starting this medication . Dr. Eden Emms recommended for pt to stop Toprol medication and start Bystolic 5 mg once a day. A 30 day and 6 refills Prescription, sent to [facility] in [place]. Pt and wife aware.

## 2012-08-01 ENCOUNTER — Telehealth: Payer: Self-pay | Admitting: Cardiovascular Disease

## 2012-08-01 MED ORDER — DILTIAZEM HCL ER COATED BEADS 120 MG PO CP24: 120.0000 mg | ORAL_CAPSULE | Freq: Every day | ORAL | Status: DC

## 2012-08-01 NOTE — Telephone Encounter (Signed)
Put beta blocker down as an allergy  Start cardizem CD 120mg  daily

## 2012-08-01 NOTE — Telephone Encounter (Signed)
New problem   Pt tried 2 different BP meds and they have made him sick which is Topal 50mg   and Bystolic 5mg . He need to know what should he do about this. He want to know is he need to come in to see Dr Eden Emms or please advise him otherwise.

## 2012-08-01 NOTE — Telephone Encounter (Signed)
PT COMPLAINING  THAT BYSTOLIC IS CAUSING DIARRHEA AS WELL ALSO FEELS THAT IT IS BOTHERING HIS  COPD WHERE HE HAS TO USE HIS  INHALER IN  THE AFTERNOON ALSO IS  EXPERIENCING   INSOMNIA  PT AWARE WILL FORWARD TO  DR Eden Emms FOR REVIEW .[name] [name]

## 2012-08-01 NOTE — Telephone Encounter (Signed)
         [name] ','<More Detail >>       Alois Cliche, LPN       Sent: Tue August 01, 2012 4:22 PM                    Attached Reports    The sender attached the following reports to this message:                UNDRAY ALLMAN Description: 72 year old male  08/01/2012 Telephone Provider: Wendall Stade, MD  MRN: [id number] Department: [address]      Call Documentation    Wendall Stade, MD at 08/01/2012 4:03 PM    Status: Signed             Put beta blocker down as an allergy Start cardizem CD 120mg  daily        Alois Cliche, LPN at 08/23/8117 10:52 AM    Status: Signed             PT COMPLAINING THAT BYSTOLIC IS CAUSING DIARRHEA AS WELL ALSO FEELS THAT IT IS BOTHERING HIS COPD WHERE HE HAS TO USE HIS INHALER IN THE AFTERNOON ALSO IS EXPERIENCING INSOMNIA PT AWARE WILL FORWARD TO DR Eden Emms FOR REVIEW .Kennieth Francois at 08/01/2012 10:25 AM    Status: Signed             New problem  Pt tried 2 different BP meds and they have made him sick which is Topal 50mg  and Bystolic 5mg . He need to know what should he do about this. He want to know is he need to come in to see Dr Eden Emms or please advise him otherwise.          Visit Pharmacy    [id number] [place],  - [address] AT [facility]      Contacts      Type Contact Phone   08/01/2012 10:22 AM Phone (Incoming) Kirin, Pastorino (Self) [phone] (H)      Routing History    Priority Sent On From To Message Type    08/01/2012 10:52 AM Alois Cliche, LPN Wendall Stade, MD Patient Calls    08/01/2012 10:25 AM Debra Lenell Antu, LPN Patient Calls      Created by    Doreatha Massed on 08/01/2012 10:22 AM      PT'S WIFE AWARE OF MED CHANGE  WILL SEND SCRIPT FOR CARDIZEM CD 120 MG TO  [place] [facility]

## 2012-08-21 ENCOUNTER — Telehealth: Payer: Self-pay | Admitting: Cardiovascular Disease

## 2012-08-21 DIAGNOSIS — R030 Elevated blood-pressure reading, without diagnosis of hypertension: Secondary | ICD-10-CM

## 2012-08-21 MED ORDER — DILTIAZEM HCL ER COATED BEADS 120 MG PO CP24: 120.0000 mg | ORAL_CAPSULE | Freq: Every day | ORAL | Status: DC

## 2012-08-21 NOTE — Telephone Encounter (Signed)
New Problem:    Patient called in because he only received 7 pills for his last refill of diltiazem (CARDIZEM CD) 120 MG 24 hr capsule and wanted to state that he can tolerate this BP medication and would like it to be filled for a 90 day supply.  Please call back.

## 2012-08-21 NOTE — Telephone Encounter (Signed)
[facility] . Patient states this medication is doing well for him.

## 2012-08-22 DIAGNOSIS — M25569 Pain in unspecified knee: Secondary | ICD-10-CM | POA: Diagnosis not present

## 2012-08-25 ENCOUNTER — Encounter: Payer: Self-pay | Admitting: Family Medicine

## 2012-08-25 ENCOUNTER — Ambulatory Visit (INDEPENDENT_AMBULATORY_CARE_PROVIDER_SITE_OTHER): Payer: Medicare Other | Admitting: Family Medicine

## 2012-08-25 VITALS — BP 144/90 | Temp 97.7°F | Wt 208.8 lb

## 2012-08-25 DIAGNOSIS — J441 Chronic obstructive pulmonary disease with (acute) exacerbation: Secondary | ICD-10-CM | POA: Diagnosis not present

## 2012-08-25 MED ORDER — MONTELUKAST SODIUM 10 MG PO TABS: 10.0000 mg | ORAL_TABLET | Freq: Every evening | ORAL | Status: DC | PRN

## 2012-08-25 MED ORDER — ALBUTEROL SULFATE HFA 108 (90 BASE) MCG/ACT IN AERS: 2.0000 | INHALATION_SPRAY | Freq: Four times a day (QID) | RESPIRATORY_TRACT | Status: DC | PRN

## 2012-08-25 MED ORDER — PREDNISONE 20 MG PO TABS: ORAL_TABLET | ORAL | Status: DC

## 2012-08-25 NOTE — Progress Notes (Signed)
  Subjective:    Patient ID: [name] [name], male    DOB: 07/11/1940, 72 y.o.   MRN: [id number]  Shortness of Breath This is a recurrent problem. The current episode started more than 1 month ago. The problem occurs intermittently. The problem has been gradually worsening. Nothing aggravates the symptoms. The patient has no known risk factors for DVT/PE. He has tried nothing for the symptoms. The treatment provided mild relief. His past medical history is significant for allergies.    Patient has known history of coronary artery disease. No chest pain. No exertional component. No nausea or diaphoresis. Known history of COPD. Had a protracted flare last fall which required oral steroids plus Flovent. No longer taking Flovent. Has known history of allergies. Singulair often helps. Has not started Singulair back.  Review of Systems  Respiratory: Positive for shortness of breath.    Otherwise negative.    Objective:   Physical Exam  Alert no acute distress. Vitals reviewed. Blood pressure improved on repeat at 136/86. Lungs bilateral wheezes. No tachypnea. Heart regular in rhythm. Chest wall nontender.      Assessment & Plan:  Impression exacerbation of COPD D., related to #2. Doubt association with patient's coronary artery disease. #2 allergic rhinitis. Plan resume Flovent 110 2 puffs twice a day. Prednisone taper. Resume Singulair. Warning signs discussed. 25 minutes spent most in discussion. WSL

## 2012-08-26 ENCOUNTER — Encounter: Payer: Self-pay | Admitting: *Deleted

## 2012-09-13 DIAGNOSIS — L719 Rosacea, unspecified: Secondary | ICD-10-CM | POA: Diagnosis not present

## 2012-09-13 DIAGNOSIS — L570 Actinic keratosis: Secondary | ICD-10-CM | POA: Diagnosis not present

## 2012-09-14 DIAGNOSIS — K219 Gastro-esophageal reflux disease without esophagitis: Secondary | ICD-10-CM | POA: Diagnosis not present

## 2012-09-14 DIAGNOSIS — D126 Benign neoplasm of colon, unspecified: Secondary | ICD-10-CM | POA: Diagnosis not present

## 2012-09-14 DIAGNOSIS — K589 Irritable bowel syndrome without diarrhea: Secondary | ICD-10-CM | POA: Diagnosis not present

## 2012-09-14 DIAGNOSIS — R141 Gas pain: Secondary | ICD-10-CM | POA: Diagnosis not present

## 2012-09-14 DIAGNOSIS — R109 Unspecified abdominal pain: Secondary | ICD-10-CM | POA: Diagnosis not present

## 2012-09-14 DIAGNOSIS — K2270 Barrett's esophagus without dysplasia: Secondary | ICD-10-CM | POA: Diagnosis not present

## 2012-09-14 DIAGNOSIS — R143 Flatulence: Secondary | ICD-10-CM | POA: Diagnosis not present

## 2012-10-02 ENCOUNTER — Ambulatory Visit (INDEPENDENT_AMBULATORY_CARE_PROVIDER_SITE_OTHER): Payer: Medicare Other | Admitting: Family Medicine

## 2012-10-02 ENCOUNTER — Encounter: Payer: Self-pay | Admitting: Family Medicine

## 2012-10-02 VITALS — BP 116/76 | Temp 98.3°F | Wt 208.0 lb

## 2012-10-02 DIAGNOSIS — J019 Acute sinusitis, unspecified: Secondary | ICD-10-CM | POA: Diagnosis not present

## 2012-10-02 MED ORDER — SULFAMETHOXAZOLE-TRIMETHOPRIM 800-160 MG PO TABS: 1.0000 | ORAL_TABLET | Freq: Two times a day (BID) | ORAL | Status: AC

## 2012-10-02 NOTE — Patient Instructions (Signed)

## 2012-10-02 NOTE — Progress Notes (Signed)
  Subjective:    Patient ID: [name] [name], male    DOB: 1940/06/29, 72 y.o.   MRN: [id number]  Sinusitis The current episode started 1 to 4 weeks ago. Associated symptoms include congestion, headaches, a hoarse voice and sinus pressure. (Dizziness) Past treatments include oral decongestants. The treatment provided moderate relief.  no mucous. occas cough. Some allergies.    Review of Systems  HENT: Positive for congestion, hoarse voice and sinus pressure.   Neurological: Positive for headaches.       Objective:   Physical Exam  Constitutional: He appears well-developed and well-nourished. No distress.  HENT:  Head: Normocephalic.  Right Ear: External ear normal.  Left Ear: External ear normal.  Neck: Normal range of motion. No thyromegaly present.  Cardiovascular: Normal rate, regular rhythm and normal heart sounds.   Pulmonary/Chest: Effort normal. He has no wheezes.  Lymphadenopathy:    He has no cervical adenopathy.          Assessment & Plan:  #1 sinusitis-Bactrim DS twice a day for the next 10 days call if progressive troubles or worse warning signs were discussed. Patient also has a colonoscopy next week he should do fine for this

## 2012-10-11 DIAGNOSIS — D126 Benign neoplasm of colon, unspecified: Secondary | ICD-10-CM | POA: Diagnosis not present

## 2012-10-11 DIAGNOSIS — Z86010 Personal history of colonic polyps: Secondary | ICD-10-CM | POA: Diagnosis not present

## 2012-10-11 DIAGNOSIS — J449 Chronic obstructive pulmonary disease, unspecified: Secondary | ICD-10-CM | POA: Diagnosis not present

## 2012-10-11 DIAGNOSIS — Z1211 Encounter for screening for malignant neoplasm of colon: Secondary | ICD-10-CM | POA: Diagnosis not present

## 2012-10-11 DIAGNOSIS — J45909 Unspecified asthma, uncomplicated: Secondary | ICD-10-CM | POA: Diagnosis not present

## 2012-10-11 DIAGNOSIS — R109 Unspecified abdominal pain: Secondary | ICD-10-CM | POA: Diagnosis not present

## 2012-10-23 DIAGNOSIS — K219 Gastro-esophageal reflux disease without esophagitis: Secondary | ICD-10-CM | POA: Diagnosis not present

## 2012-10-23 DIAGNOSIS — D126 Benign neoplasm of colon, unspecified: Secondary | ICD-10-CM | POA: Diagnosis not present

## 2012-10-30 DIAGNOSIS — M545 Low back pain, unspecified: Secondary | ICD-10-CM | POA: Diagnosis not present

## 2012-10-30 DIAGNOSIS — M25559 Pain in unspecified hip: Secondary | ICD-10-CM | POA: Diagnosis not present

## 2012-11-06 DIAGNOSIS — M25559 Pain in unspecified hip: Secondary | ICD-10-CM | POA: Diagnosis not present

## 2012-11-06 DIAGNOSIS — M545 Low back pain, unspecified: Secondary | ICD-10-CM | POA: Diagnosis not present

## 2012-11-15 DIAGNOSIS — L719 Rosacea, unspecified: Secondary | ICD-10-CM | POA: Diagnosis not present

## 2012-11-15 DIAGNOSIS — Z85820 Personal history of malignant melanoma of skin: Secondary | ICD-10-CM | POA: Diagnosis not present

## 2012-11-15 DIAGNOSIS — L570 Actinic keratosis: Secondary | ICD-10-CM | POA: Diagnosis not present

## 2012-11-15 DIAGNOSIS — C44611 Basal cell carcinoma of skin of unspecified upper limb, including shoulder: Secondary | ICD-10-CM | POA: Diagnosis not present

## 2012-11-20 ENCOUNTER — Other Ambulatory Visit (HOSPITAL_COMMUNITY): Payer: Self-pay | Admitting: Orthopaedic Surgery

## 2012-11-20 DIAGNOSIS — M545 Low back pain, unspecified: Secondary | ICD-10-CM | POA: Diagnosis not present

## 2012-11-20 DIAGNOSIS — M25559 Pain in unspecified hip: Secondary | ICD-10-CM | POA: Diagnosis not present

## 2012-11-22 ENCOUNTER — Ambulatory Visit (HOSPITAL_COMMUNITY)
Admission: RE | Admit: 2012-11-22 | Discharge: 2012-11-22 | Disposition: A | Discharge status: Home / Self Care | Payer: Medicare Other | Source: Ambulatory Visit | Attending: Orthopaedic Surgery | Admitting: Orthopaedic Surgery

## 2012-11-22 ENCOUNTER — Encounter (HOSPITAL_COMMUNITY): Payer: Self-pay

## 2012-11-22 DIAGNOSIS — M5126 Other intervertebral disc displacement, lumbar region: Secondary | ICD-10-CM | POA: Diagnosis not present

## 2012-11-22 DIAGNOSIS — M5137 Other intervertebral disc degeneration, lumbosacral region: Secondary | ICD-10-CM | POA: Diagnosis not present

## 2012-11-22 DIAGNOSIS — M5380 Other specified dorsopathies, site unspecified: Secondary | ICD-10-CM | POA: Diagnosis not present

## 2012-11-22 DIAGNOSIS — M48061 Spinal stenosis, lumbar region without neurogenic claudication: Secondary | ICD-10-CM | POA: Diagnosis not present

## 2012-11-22 DIAGNOSIS — M545 Low back pain, unspecified: Secondary | ICD-10-CM | POA: Diagnosis not present

## 2012-11-22 MED ORDER — GADOBENATE DIMEGLUMINE 529 MG/ML IV SOLN
20.0000 mL | Freq: Once | INTRAVENOUS | Status: AC | PRN
  Administered 2012-11-22: 20 mL via INTRAVENOUS

## 2012-11-23 DIAGNOSIS — D126 Benign neoplasm of colon, unspecified: Secondary | ICD-10-CM | POA: Diagnosis not present

## 2012-11-24 DIAGNOSIS — M545 Low back pain, unspecified: Secondary | ICD-10-CM | POA: Diagnosis not present

## 2012-11-24 DIAGNOSIS — M25559 Pain in unspecified hip: Secondary | ICD-10-CM | POA: Diagnosis not present

## 2012-12-20 DIAGNOSIS — K921 Melena: Secondary | ICD-10-CM | POA: Diagnosis not present

## 2013-01-01 DIAGNOSIS — M48062 Spinal stenosis, lumbar region with neurogenic claudication: Secondary | ICD-10-CM | POA: Diagnosis not present

## 2013-01-04 DIAGNOSIS — M545 Low back pain, unspecified: Secondary | ICD-10-CM | POA: Diagnosis not present

## 2013-01-04 DIAGNOSIS — M6281 Muscle weakness (generalized): Secondary | ICD-10-CM | POA: Diagnosis not present

## 2013-01-08 DIAGNOSIS — M545 Low back pain, unspecified: Secondary | ICD-10-CM | POA: Diagnosis not present

## 2013-01-08 DIAGNOSIS — M6281 Muscle weakness (generalized): Secondary | ICD-10-CM | POA: Diagnosis not present

## 2013-01-12 DIAGNOSIS — M6281 Muscle weakness (generalized): Secondary | ICD-10-CM | POA: Diagnosis not present

## 2013-01-12 DIAGNOSIS — M545 Low back pain, unspecified: Secondary | ICD-10-CM | POA: Diagnosis not present

## 2013-01-16 DIAGNOSIS — M6281 Muscle weakness (generalized): Secondary | ICD-10-CM | POA: Diagnosis not present

## 2013-01-16 DIAGNOSIS — M545 Low back pain, unspecified: Secondary | ICD-10-CM | POA: Diagnosis not present

## 2013-01-19 DIAGNOSIS — M6281 Muscle weakness (generalized): Secondary | ICD-10-CM | POA: Diagnosis not present

## 2013-01-19 DIAGNOSIS — M545 Low back pain, unspecified: Secondary | ICD-10-CM | POA: Diagnosis not present

## 2013-01-20 DIAGNOSIS — J988 Other specified respiratory disorders: Secondary | ICD-10-CM | POA: Diagnosis not present

## 2013-01-20 DIAGNOSIS — J449 Chronic obstructive pulmonary disease, unspecified: Secondary | ICD-10-CM | POA: Diagnosis not present

## 2013-01-29 DIAGNOSIS — M545 Low back pain, unspecified: Secondary | ICD-10-CM | POA: Diagnosis not present

## 2013-01-29 DIAGNOSIS — M6281 Muscle weakness (generalized): Secondary | ICD-10-CM | POA: Diagnosis not present

## 2013-01-31 DIAGNOSIS — M545 Low back pain, unspecified: Secondary | ICD-10-CM | POA: Diagnosis not present

## 2013-01-31 DIAGNOSIS — M6281 Muscle weakness (generalized): Secondary | ICD-10-CM | POA: Diagnosis not present

## 2013-02-01 DIAGNOSIS — M545 Low back pain, unspecified: Secondary | ICD-10-CM | POA: Diagnosis not present

## 2013-02-01 DIAGNOSIS — M6281 Muscle weakness (generalized): Secondary | ICD-10-CM | POA: Diagnosis not present

## 2013-02-05 DIAGNOSIS — M6281 Muscle weakness (generalized): Secondary | ICD-10-CM | POA: Diagnosis not present

## 2013-02-05 DIAGNOSIS — M545 Low back pain, unspecified: Secondary | ICD-10-CM | POA: Diagnosis not present

## 2013-02-07 DIAGNOSIS — M545 Low back pain, unspecified: Secondary | ICD-10-CM | POA: Diagnosis not present

## 2013-02-07 DIAGNOSIS — M6281 Muscle weakness (generalized): Secondary | ICD-10-CM | POA: Diagnosis not present

## 2013-02-09 DIAGNOSIS — M545 Low back pain, unspecified: Secondary | ICD-10-CM | POA: Diagnosis not present

## 2013-02-09 DIAGNOSIS — M6281 Muscle weakness (generalized): Secondary | ICD-10-CM | POA: Diagnosis not present

## 2013-02-12 DIAGNOSIS — M545 Low back pain, unspecified: Secondary | ICD-10-CM | POA: Diagnosis not present

## 2013-02-12 DIAGNOSIS — M6281 Muscle weakness (generalized): Secondary | ICD-10-CM | POA: Diagnosis not present

## 2013-02-14 DIAGNOSIS — M6281 Muscle weakness (generalized): Secondary | ICD-10-CM | POA: Diagnosis not present

## 2013-02-14 DIAGNOSIS — M545 Low back pain, unspecified: Secondary | ICD-10-CM | POA: Diagnosis not present

## 2013-02-20 DIAGNOSIS — Z23 Encounter for immunization: Secondary | ICD-10-CM | POA: Diagnosis not present

## 2013-02-21 DIAGNOSIS — M6281 Muscle weakness (generalized): Secondary | ICD-10-CM | POA: Diagnosis not present

## 2013-02-21 DIAGNOSIS — M48062 Spinal stenosis, lumbar region with neurogenic claudication: Secondary | ICD-10-CM | POA: Diagnosis not present

## 2013-02-21 DIAGNOSIS — M545 Low back pain, unspecified: Secondary | ICD-10-CM | POA: Diagnosis not present

## 2013-03-02 DIAGNOSIS — M48062 Spinal stenosis, lumbar region with neurogenic claudication: Secondary | ICD-10-CM | POA: Diagnosis not present

## 2013-03-02 DIAGNOSIS — M5137 Other intervertebral disc degeneration, lumbosacral region: Secondary | ICD-10-CM | POA: Diagnosis not present

## 2013-03-07 DIAGNOSIS — H2510 Age-related nuclear cataract, unspecified eye: Secondary | ICD-10-CM | POA: Diagnosis not present

## 2013-03-07 DIAGNOSIS — H538 Other visual disturbances: Secondary | ICD-10-CM | POA: Diagnosis not present

## 2013-03-07 DIAGNOSIS — H4010X0 Unspecified open-angle glaucoma, stage unspecified: Secondary | ICD-10-CM | POA: Diagnosis not present

## 2013-03-09 ENCOUNTER — Encounter: Payer: Self-pay | Admitting: Family Medicine

## 2013-03-09 ENCOUNTER — Ambulatory Visit (INDEPENDENT_AMBULATORY_CARE_PROVIDER_SITE_OTHER): Payer: Medicare Other | Admitting: Family Medicine

## 2013-03-09 VITALS — BP 122/82 | Temp 98.3°F | Ht 73.0 in | Wt 210.8 lb

## 2013-03-09 DIAGNOSIS — Z79899 Other long term (current) drug therapy: Secondary | ICD-10-CM | POA: Diagnosis not present

## 2013-03-09 DIAGNOSIS — J019 Acute sinusitis, unspecified: Secondary | ICD-10-CM

## 2013-03-09 DIAGNOSIS — Z125 Encounter for screening for malignant neoplasm of prostate: Secondary | ICD-10-CM

## 2013-03-09 DIAGNOSIS — Z0189 Encounter for other specified special examinations: Secondary | ICD-10-CM | POA: Diagnosis not present

## 2013-03-09 DIAGNOSIS — E782 Mixed hyperlipidemia: Secondary | ICD-10-CM

## 2013-03-09 MED ORDER — SULFAMETHOXAZOLE-TMP DS 800-160 MG PO TABS: 1.0000 | ORAL_TABLET | Freq: Two times a day (BID) | ORAL | Status: DC

## 2013-03-09 NOTE — Progress Notes (Signed)
  Subjective:    Patient ID: NICKO DAHER, male    DOB: 03-27-1941, 72 y.o.   MRN: [id number]  Sinus Problem This is a new problem. The current episode started 1 to 4 weeks ago. Associated symptoms include congestion and coughing.   Cough congestion and headache.  Cough is productive of phlegm intermittently. No obvious fever. No wheezes. Some diminished energy.  Also was told by his GI specialist and that he needed to see a general surgeon for polyp removal.     Review of Systems  HENT: Positive for congestion.   Respiratory: Positive for cough.    otherwise negative     Objective:   Physical Exam  Alert no acute distress. HEENT moderate his congestion some frontal tenderness pharynx erythema no tachypnea no crackles or wheezes heart regular in rhythm.      Assessment & Plan:  Impression rhinosinusitis #2 complicated polyp plan advised patient Dr. Jori Moll consult will handle this well. Bactrim DS twice a day 10 days. Symptomatic care discussed. WSL

## 2013-03-21 DIAGNOSIS — M48062 Spinal stenosis, lumbar region with neurogenic claudication: Secondary | ICD-10-CM | POA: Diagnosis not present

## 2013-03-22 ENCOUNTER — Other Ambulatory Visit: Payer: Self-pay

## 2013-03-23 ENCOUNTER — Other Ambulatory Visit: Payer: Self-pay | Admitting: Family Medicine

## 2013-03-26 DIAGNOSIS — Z125 Encounter for screening for malignant neoplasm of prostate: Secondary | ICD-10-CM | POA: Diagnosis not present

## 2013-03-26 DIAGNOSIS — E782 Mixed hyperlipidemia: Secondary | ICD-10-CM | POA: Diagnosis not present

## 2013-03-26 DIAGNOSIS — Z79899 Other long term (current) drug therapy: Secondary | ICD-10-CM | POA: Diagnosis not present

## 2013-03-26 LAB — BASIC METABOLIC PANEL
BUN: 12 mg/dL (ref 6–23)
CO2: 29 mEq/L (ref 19–32)
Calcium: 9 mg/dL (ref 8.4–10.5)
Chloride: 102 mEq/L (ref 96–112)
Creat: 0.98 mg/dL (ref 0.50–1.35)
Glucose, Bld: 98 mg/dL (ref 70–99)
Potassium: 4.4 mEq/L (ref 3.5–5.3)
Sodium: 140 mEq/L (ref 135–145)

## 2013-03-26 LAB — HEPATIC FUNCTION PANEL
ALT: 22 U/L (ref 0–53)
AST: 19 U/L (ref 0–37)
Albumin: 3.9 g/dL (ref 3.5–5.2)
Alkaline Phosphatase: 65 U/L (ref 39–117)
Bilirubin, Direct: 0.1 mg/dL (ref 0.0–0.3)
Indirect Bilirubin: 0.5 mg/dL (ref 0.0–0.9)
Total Bilirubin: 0.6 mg/dL (ref 0.3–1.2)
Total Protein: 6.7 g/dL (ref 6.0–8.3)

## 2013-03-26 LAB — LIPID PANEL
Cholesterol: 142 mg/dL (ref 0–200)
HDL: 45 mg/dL (ref >39)
LDL Cholesterol: 83 mg/dL (ref 0–99)
Total CHOL/HDL Ratio: 3.2 Ratio
Triglycerides: 72 mg/dL (ref <150)
VLDL: 14 mg/dL (ref 0–40)

## 2013-03-27 ENCOUNTER — Encounter (HOSPITAL_COMMUNITY): Payer: Self-pay

## 2013-03-27 ENCOUNTER — Encounter (HOSPITAL_COMMUNITY)
Admission: RE | Admit: 2013-03-27 | Discharge: 2013-03-27 | Disposition: A | Discharge status: Home / Self Care | Payer: Medicare Other | Source: Ambulatory Visit | Attending: Ophthalmology | Admitting: Ophthalmology

## 2013-03-27 DIAGNOSIS — H2510 Age-related nuclear cataract, unspecified eye: Secondary | ICD-10-CM | POA: Diagnosis not present

## 2013-03-27 DIAGNOSIS — H538 Other visual disturbances: Secondary | ICD-10-CM | POA: Diagnosis not present

## 2013-03-27 HISTORY — DX: Unspecified hearing loss, unspecified ear: H91.90

## 2013-03-27 LAB — PSA, MEDICARE: PSA: 1.42 ng/mL (ref <=4.00)

## 2013-03-27 NOTE — Patient Instructions (Signed)
Your procedure is scheduled on: 04/02/2013  Report to [facility] at  700  AM.  Call this number if you have problems the morning of surgery: [phone]   Do not eat food or drink liquids :After Midnight.      Take these medicines the morning of surgery with A SIP OF WATER: nexium, singulair, zyrtec, cardiazem. Take your proventil before you come.   Do not wear jewelry, make-up or nail polish.  Do not wear lotions, powders, or perfumes.   Do not shave 48 hours prior to surgery.  Do not bring valuables to the hospital.  Contacts, dentures or bridgework may not be worn into surgery.  Leave suitcase in the car. After surgery it may be brought to your room.  For patients admitted to the hospital, checkout time is 11:00 AM the day of discharge.   Patients discharged the day of surgery will not be allowed to drive home.  :     Please read over the following fact sheets that you were given: Coughing and Deep Breathing, Surgical Site Infection Prevention, Anesthesia Post-op Instructions and Care and Recovery After Surgery    Cataract A cataract is a clouding of the lens of the eye. When a lens becomes cloudy, vision is reduced based on the degree and nature of the clouding. Many cataracts reduce vision to some degree. Some cataracts make people more near-sighted as they develop. Other cataracts increase glare. Cataracts that are ignored and become worse can sometimes look white. The white color can be seen through the pupil. CAUSES   Aging. However, cataracts may occur at any age, even in newborns.   Certain drugs.   Trauma to the eye.   Certain diseases such as diabetes.   Specific eye diseases such as chronic inflammation inside the eye or a sudden attack of a rare form of glaucoma.   Inherited or acquired medical problems.  SYMPTOMS   Gradual, progressive drop in vision in the affected eye.   Severe, rapid visual loss. This most often happens when trauma is the cause.  DIAGNOSIS    To detect a cataract, an eye doctor examines the lens. Cataracts are best diagnosed with an exam of the eyes with the pupils enlarged (dilated) by drops.  TREATMENT  For an early cataract, vision may improve by using different eyeglasses or stronger lighting. If that does not help your vision, surgery is the only effective treatment. A cataract needs to be surgically removed when vision loss interferes with your everyday activities, such as driving, reading, or watching TV. A cataract may also have to be removed if it prevents examination or treatment of another eye problem. Surgery removes the cloudy lens and usually replaces it with a substitute lens (intraocular lens, IOL).  At a time when both you and your doctor agree, the cataract will be surgically removed. If you have cataracts in both eyes, only one is usually removed at a time. This allows the operated eye to heal and be out of danger from any possible problems after surgery (such as infection or poor wound healing). In rare cases, a cataract may be doing damage to your eye. In these cases, your caregiver may advise surgical removal right away. The vast majority of people who have cataract surgery have better vision afterward. HOME CARE INSTRUCTIONS  If you are not planning surgery, you may be asked to do the following:  Use different eyeglasses.   Use stronger or brighter lighting.   Ask your eye  doctor about reducing your medicine dose or changing medicines if it is thought that a medicine caused your cataract. Changing medicines does not make the cataract go away on its own.   Become familiar with your surroundings. Poor vision can lead to injury. Avoid bumping into things on the affected side. You are at a higher risk for tripping or falling.   Exercise extreme care when driving or operating machinery.   Wear sunglasses if you are sensitive to bright light or experiencing problems with glare.  SEEK IMMEDIATE MEDICAL CARE IF:   You  have a worsening or sudden vision loss.   You notice redness, swelling, or increasing pain in the eye.   You have a fever.  Document Released: 05/03/2005 Document Revised: 04/22/2011 Document Reviewed: 12/25/2010 [facility] Patient Information 2012 [place].PATIENT INSTRUCTIONS POST-ANESTHESIA  IMMEDIATELY FOLLOWING SURGERY:  Do not drive or operate machinery for the first twenty four hours after surgery.  Do not make any important decisions for twenty four hours after surgery or while taking narcotic pain medications or sedatives.  If you develop intractable nausea and vomiting or a severe headache please notify your doctor immediately.  FOLLOW-UP:  Please make an appointment with your surgeon as instructed. You do not need to follow up with anesthesia unless specifically instructed to do so.  WOUND CARE INSTRUCTIONS (if applicable):  Keep a dry clean dressing on the anesthesia/puncture wound site if there is drainage.  Once the wound has quit draining you may leave it open to air.  Generally you should leave the bandage intact for twenty four hours unless there is drainage.  If the epidural site drains for more than 36-48 hours please call the anesthesia department.  QUESTIONS?:  Please feel free to call your physician or the hospital operator if you have any questions, and they will be happy to assist you.

## 2013-03-30 ENCOUNTER — Telehealth: Payer: Self-pay | Admitting: Family Medicine

## 2013-03-30 MED ORDER — TETRACAINE HCL 0.5 % OP SOLN
OPHTHALMIC | Status: AC
  Filled 2013-03-30: qty 2

## 2013-03-30 MED ORDER — CEFPROZIL 500 MG PO TABS: 500.0000 mg | ORAL_TABLET | Freq: Two times a day (BID) | ORAL | Status: DC

## 2013-03-30 MED ORDER — CYCLOPENTOLATE-PHENYLEPHRINE OP SOLN OPTIME - NO CHARGE
OPHTHALMIC | Status: AC
  Filled 2013-03-30: qty 2

## 2013-03-30 MED ORDER — LIDOCAINE HCL 3.5 % OP GEL
OPHTHALMIC | Status: AC
  Filled 2013-03-30: qty 1

## 2013-03-30 NOTE — Telephone Encounter (Signed)
Medication was sent in to pharmacy. Left message notifying patient on voicemail.

## 2013-03-30 NOTE — Telephone Encounter (Signed)
Discuss with pt, cefzil 500 bid 10 days

## 2013-03-30 NOTE — Telephone Encounter (Signed)
Patient is having sinus issues again that he was seen for on 03/09/2013. He is wondering if we can send in a new antibiotic for him? He has cataracts surgery on Monday.    [facility] 

## 2013-04-02 ENCOUNTER — Encounter (HOSPITAL_COMMUNITY): Payer: Medicare Other | Admitting: Anesthesiology

## 2013-04-02 ENCOUNTER — Encounter (HOSPITAL_COMMUNITY)
Admission: RE | Disposition: A | Discharge status: Home / Self Care | Payer: Self-pay | Source: Ambulatory Visit | Attending: Ophthalmology

## 2013-04-02 ENCOUNTER — Encounter (HOSPITAL_COMMUNITY): Payer: Self-pay | Admitting: *Deleted

## 2013-04-02 ENCOUNTER — Ambulatory Visit (HOSPITAL_COMMUNITY)
Admission: RE | Admit: 2013-04-02 | Discharge: 2013-04-02 | Disposition: A | Discharge status: Home / Self Care | Payer: Medicare Other | Source: Ambulatory Visit | Attending: Ophthalmology | Admitting: Ophthalmology

## 2013-04-02 ENCOUNTER — Ambulatory Visit (HOSPITAL_COMMUNITY): Payer: Medicare Other | Admitting: Anesthesiology

## 2013-04-02 DIAGNOSIS — H538 Other visual disturbances: Secondary | ICD-10-CM | POA: Diagnosis not present

## 2013-04-02 DIAGNOSIS — J4489 Other specified chronic obstructive pulmonary disease: Secondary | ICD-10-CM | POA: Insufficient documentation

## 2013-04-02 DIAGNOSIS — J449 Chronic obstructive pulmonary disease, unspecified: Secondary | ICD-10-CM | POA: Diagnosis not present

## 2013-04-02 DIAGNOSIS — H2510 Age-related nuclear cataract, unspecified eye: Secondary | ICD-10-CM | POA: Insufficient documentation

## 2013-04-02 DIAGNOSIS — I10 Essential (primary) hypertension: Secondary | ICD-10-CM | POA: Diagnosis not present

## 2013-04-02 DIAGNOSIS — H269 Unspecified cataract: Secondary | ICD-10-CM | POA: Diagnosis not present

## 2013-04-02 HISTORY — PX: CATARACT EXTRACTION W/PHACO: SHX586

## 2013-04-02 SURGERY — PHACOEMULSIFICATION, CATARACT, WITH IOL INSERTION
Anesthesia: Monitor Anesthesia Care | Site: Eye | Laterality: Left | Wound class: Clean

## 2013-04-02 MED ORDER — MIDAZOLAM HCL 2 MG/2ML IJ SOLN
1.0000 mg | INTRAMUSCULAR | Status: DC | PRN
  Administered 2013-04-02: 2 mg via INTRAVENOUS

## 2013-04-02 MED ORDER — LIDOCAINE HCL 3.5 % OP GEL
OPHTHALMIC | Status: DC | PRN
  Administered 2013-04-02: 1 via OPHTHALMIC

## 2013-04-02 MED ORDER — CYCLOPENTOLATE-PHENYLEPHRINE 0.2-1 % OP SOLN
1.0000 [drp] | OPHTHALMIC | Status: AC
Start: 2013-04-02 — End: 2013-04-02
  Administered 2013-04-02 (×3): 1 [drp] via OPHTHALMIC

## 2013-04-02 MED ORDER — LACTATED RINGERS IV SOLN
INTRAVENOUS | Status: DC
  Administered 2013-04-02: 08:00:00 via INTRAVENOUS

## 2013-04-02 MED ORDER — POVIDONE-IODINE 5 % OP SOLN
OPHTHALMIC | Status: DC | PRN
  Administered 2013-04-02: 1 via OPHTHALMIC

## 2013-04-02 MED ORDER — FENTANYL CITRATE 0.05 MG/ML IJ SOLN
INTRAMUSCULAR | Status: AC
  Filled 2013-04-02: qty 2

## 2013-04-02 MED ORDER — MIDAZOLAM HCL 2 MG/2ML IJ SOLN
INTRAMUSCULAR | Status: AC
  Filled 2013-04-02: qty 2

## 2013-04-02 MED ORDER — EPINEPHRINE HCL 1 MG/ML IJ SOLN
INTRAOCULAR | Status: DC | PRN
  Administered 2013-04-02: 08:00:00

## 2013-04-02 MED ORDER — NA HYALUR & NA CHOND-NA HYALUR 0.55-0.5 ML IO KIT
PACK | INTRAOCULAR | Status: DC | PRN
  Administered 2013-04-02: 1 via OPHTHALMIC

## 2013-04-02 MED ORDER — FENTANYL CITRATE 0.05 MG/ML IJ SOLN
25.0000 ug | INTRAMUSCULAR | Status: AC
  Administered 2013-04-02 (×2): 25 ug via INTRAVENOUS

## 2013-04-02 MED ORDER — BSS IO SOLN
INTRAOCULAR | Status: DC | PRN
  Administered 2013-04-02: 15 mL via INTRAOCULAR

## 2013-04-02 MED ORDER — LIDOCAINE HCL 3.5 % OP GEL: 1.0000 "application " | Freq: Once | OPHTHALMIC | Status: DC

## 2013-04-02 MED ORDER — TETRACAINE HCL 0.5 % OP SOLN
1.0000 [drp] | OPHTHALMIC | Status: AC
  Administered 2013-04-02 (×3): 1 [drp] via OPHTHALMIC

## 2013-04-02 MED ORDER — TETRACAINE HCL 0.5 % OP SOLN
OPHTHALMIC | Status: DC | PRN
  Administered 2013-04-02: 1 [drp] via OPHTHALMIC

## 2013-04-02 SURGICAL SUPPLY — 28 items
CAPSULAR TENSION RING-AMO (OPHTHALMIC RELATED) IMPLANT
CLOTH BEACON ORANGE TIMEOUT ST (SAFETY) ×2 IMPLANT
DRAPE X-RAY CASS 24X20 (DRAPES) ×2 IMPLANT
GLOVE BIO SURGEON STRL SZ7.5 (GLOVE) IMPLANT
GLOVE BIOGEL M 6.5 STRL (GLOVE) IMPLANT
GLOVE BIOGEL PI IND STRL 6.5 (GLOVE) ×1 IMPLANT
GLOVE BIOGEL PI IND STRL 7.0 (GLOVE) IMPLANT
GLOVE BIOGEL PI INDICATOR 6.5 (GLOVE) ×1
GLOVE BIOGEL PI INDICATOR 7.0 (GLOVE)
GLOVE ECLIPSE 6.5 STRL STRAW (GLOVE) IMPLANT
GLOVE ECLIPSE 7.5 STRL STRAW (GLOVE) IMPLANT
GLOVE EXAM NITRILE LRG STRL (GLOVE) ×2 IMPLANT
GLOVE EXAM NITRILE MD LF STRL (GLOVE) IMPLANT
GLOVE SKINSENSE NS SZ6.5 (GLOVE)
GLOVE SKINSENSE NS SZ7.0 (GLOVE)
GLOVE SKINSENSE STRL SZ6.5 (GLOVE) IMPLANT
GLOVE SKINSENSE STRL SZ7.0 (GLOVE) IMPLANT
INST SET CATARACT ~~LOC~~ (KITS) ×2 IMPLANT
KIT VITRECTOMY (OPHTHALMIC RELATED) IMPLANT
PAD ARMBOARD 7.5X6 YLW CONV (MISCELLANEOUS) ×2 IMPLANT
PROC W NO LENS (INTRAOCULAR LENS)
PROC W SPEC LENS (INTRAOCULAR LENS)
PROCESS W NO LENS (INTRAOCULAR LENS) IMPLANT
PROCESS W SPEC LENS (INTRAOCULAR LENS) IMPLANT
RING MALYGIN (MISCELLANEOUS) IMPLANT
SIGHTPATH CAT PROC W REG LENS (Ophthalmic Related) ×2 IMPLANT
VISCOELASTIC ADDITIONAL (OPHTHALMIC RELATED) IMPLANT
WATER STERILE IRR 250ML POUR (IV SOLUTION) ×2 IMPLANT

## 2013-04-02 NOTE — H&P (Signed)
I have reviewed the pre printed H&P, the patient was re-examined, and I have identified no significant interval changes in the patient's medical condition.  There is no change in the plan of care since the history and physical of record. 

## 2013-04-02 NOTE — Op Note (Signed)
See scanned note.

## 2013-04-02 NOTE — Brief Op Note (Signed)
04/02/2013  9:13 AM  PATIENT:  [name]  72 y.o. male  PRE-OPERATIVE DIAGNOSIS:  nuclear cataract left eye  POST-OPERATIVE DIAGNOSIS:  nuclear cataract left eye  PROCEDURE:  Procedure(s): CATARACT EXTRACTION PHACO AND INTRAOCULAR LENS PLACEMENT (IOC)  SURGEON:  Surgeon(s): Susa Simmonds, MD  ASSISTANTS: Trenton Founds, CST   ANESTHESIA STAFF: Anesthesiologist: Laurene Footman, MD CRNA: Despina Hidden, CRNA  ANESTHESIA:   topical and MAC  REQUESTED LENS POWER: 17.5  LENS IMPLANT INFORMATION: Alcon SN60WF 17.5 s/n [id number]  Exp 01/2014  CUMULATIVE DISSIPATED ENERGY:6.50  INDICATIONS:see office H&P  OP FINDINGS:dense NS  COMPLICATIONS:None  DICTATION #: see scanned note  PLAN OF CARE: as above  PATIENT DISPOSITION:  Short Stay

## 2013-04-02 NOTE — Anesthesia Preprocedure Evaluation (Signed)
Anesthesia Evaluation  Patient identified by MRN, date of birth, ID band Patient awake    Reviewed: Allergy & Precautions, H&P , NPO status , Patient's Chart, lab work & pertinent test results  Airway Mallampati: II      Dental  (+) Teeth Intact   Pulmonary COPDformer smoker,          Cardiovascular hypertension, Pt. on medications - angina+ CAD, + Past MI and + Peripheral Vascular Disease + dysrhythmias Rhythm:Regular Rate:Normal     Neuro/Psych    GI/Hepatic GERD-  Controlled and Medicated,  Endo/Other    Renal/GU      Musculoskeletal   Abdominal   Peds  Hematology   Anesthesia Other Findings   Reproductive/Obstetrics                           Anesthesia Physical Anesthesia Plan  ASA: III  Anesthesia Plan: MAC   Post-op Pain Management:    Induction: Intravenous  Airway Management Planned: Nasal Cannula  Additional Equipment:   Intra-op Plan:   Post-operative Plan:   Informed Consent: I have reviewed the patients History and Physical, chart, labs and discussed the procedure including the risks, benefits and alternatives for the proposed anesthesia with the patient or authorized representative who has indicated his/her understanding and acceptance.     Plan Discussed with:   Anesthesia Plan Comments:         Anesthesia Quick Evaluation

## 2013-04-02 NOTE — Anesthesia Postprocedure Evaluation (Signed)
  Anesthesia Post-op Note  Patient: [name] [name]  Procedure(s) Performed: Procedure(s) with comments: CATARACT EXTRACTION PHACO AND INTRAOCULAR LENS PLACEMENT (IOC) (Left) - CDE:  6.50  Patient Location: Short Stay  Anesthesia Type:MAC  Level of Consciousness: awake, alert , oriented and patient cooperative  Airway and Oxygen Therapy: Patient Spontanous Breathing  Post-op Pain: none  Post-op Assessment: Post-op Vital signs reviewed, Patient's Cardiovascular Status Stable, Respiratory Function Stable, Patent Airway, No signs of Nausea or vomiting and Pain level controlled  Post-op Vital Signs: Reviewed and stable  Complications: No apparent anesthesia complications

## 2013-04-02 NOTE — Transfer of Care (Signed)
Immediate Anesthesia Transfer of Care Note  Patient: [name] [name]  Procedure(s) Performed: Procedure(s) with comments: CATARACT EXTRACTION PHACO AND INTRAOCULAR LENS PLACEMENT (IOC) (Left) - CDE:  6.50  Patient Location: Short Stay  Anesthesia Type:MAC  Level of Consciousness: awake, alert  and patient cooperative  Airway & Oxygen Therapy: Patient Spontanous Breathing  Post-op Assessment: Report given to PACU RN, Post -op Vital signs reviewed and stable and Patient moving all extremities  Post vital signs: Reviewed and stable  Complications: No apparent anesthesia complications

## 2013-04-03 ENCOUNTER — Encounter (HOSPITAL_COMMUNITY): Payer: Self-pay | Admitting: Ophthalmology

## 2013-04-10 ENCOUNTER — Encounter: Payer: Self-pay | Admitting: Family Medicine

## 2013-04-10 ENCOUNTER — Ambulatory Visit (INDEPENDENT_AMBULATORY_CARE_PROVIDER_SITE_OTHER): Payer: Medicare Other | Admitting: Family Medicine

## 2013-04-10 VITALS — BP 128/78 | HR 80 | Ht 73.0 in | Wt 209.0 lb

## 2013-04-10 DIAGNOSIS — Z0000 Encounter for general adult medical examination without abnormal findings: Secondary | ICD-10-CM | POA: Diagnosis not present

## 2013-04-10 NOTE — Progress Notes (Signed)
Subjective:    Patient ID: [name] [name], male    DOB: 1941/03/01, 72 y.o.   MRN: [id number]  HPIHere for a welllness exam. No concerns. No falls in the last 6 months.   Exercising fairly goo until this past April. Not as much since the back started acting up.pt has some indoor  Watching diet fairly closely, sometimes too many sweets  Had flu shot  Mini cog pass.  Breathing overall good   PT didn't help, injection has helped, less shooting pains  Results for orders placed in visit on 03/09/13  LIPID PANEL      Result Value Range   Cholesterol 142  0 - 200 mg/dL   Triglycerides 72  <409 mg/dL   HDL 45  >81 mg/dL   Total CHOL/HDL Ratio 3.2     VLDL 14  0 - 40 mg/dL   LDL Cholesterol 83  0 - 99 mg/dL  HEPATIC FUNCTION PANEL      Result Value Range   Total Bilirubin 0.6  0.3 - 1.2 mg/dL   Bilirubin, Direct 0.1  0.0 - 0.3 mg/dL   Indirect Bilirubin 0.5  0.0 - 0.9 mg/dL   Alkaline Phosphatase 65  39 - 117 U/L   AST 19  0 - 37 U/L   ALT 22  0 - 53 U/L   Total Protein 6.7  6.0 - 8.3 g/dL   Albumin 3.9  3.5 - 5.2 g/dL  BASIC METABOLIC PANEL      Result Value Range   Sodium 140  135 - 145 mEq/L   Potassium 4.4  3.5 - 5.3 mEq/L   Chloride 102  96 - 112 mEq/L   CO2 29  19 - 32 mEq/L   Glucose, Bld 98  70 - 99 mg/dL   BUN 12  6 - 23 mg/dL   Creat 1.91  4.78 - 2.95 mg/dL   Calcium 9.0  8.4 - 62.1 mg/dL  PSA, MEDICARE      Result Value Range   PSA 1.42  <=4.00 ng/mL      Review of Systems  Constitutional: Negative for fever, activity change and appetite change.  HENT: Negative for congestion and rhinorrhea.   Eyes: Negative for discharge.  Respiratory: Negative for cough and wheezing.   Cardiovascular: Negative for chest pain.  Gastrointestinal: Negative for vomiting, abdominal pain and blood in stool.  Genitourinary: Negative for frequency and difficulty urinating.  Musculoskeletal: Negative for neck pain.  Skin: Negative for rash.  Allergic/Immunologic: Negative for  environmental allergies and food allergies.  Neurological: Negative for weakness and headaches.  Psychiatric/Behavioral: Negative for agitation.       Objective:   Physical Exam  Constitutional: He appears well-developed and well-nourished.  HENT:  Head: Normocephalic and atraumatic.  Right Ear: External ear normal.  Left Ear: External ear normal.  Nose: Nose normal.  Mouth/Throat: Oropharynx is clear and moist.  Eyes: EOM are normal. Pupils are equal, round, and reactive to light.  Neck: Normal range of motion. Neck supple. No thyromegaly present.  Cardiovascular: Normal rate, regular rhythm and normal heart sounds.   No murmur heard. Pulmonary/Chest: Effort normal and breath sounds normal. No respiratory distress. He has no wheezes.  Abdominal: Soft. Bowel sounds are normal. He exhibits no distension and no mass. There is no tenderness.  Genitourinary: Prostate normal and penis normal.  Musculoskeletal: Normal range of motion. He exhibits no edema.  Lymphadenopathy:    He has no cervical adenopathy.  Neurological: He is alert. He  exhibits normal muscle tone.  Skin: Skin is warm and dry. No erythema.  Psychiatric: He has a normal mood and affect. His behavior is normal. Judgment normal.          Assessment & Plan:  Impression wellness exam #2 up-to-date on colonoscopy. In fact do a partial colectomy soon to remove large polyp #3 vaccine is discussed. #4 chronic illnesses documented plan diet discussed. Exercise discussed. Appropriate blood work. Followup with specialist. Continue followup here. WSL

## 2013-04-16 DIAGNOSIS — L570 Actinic keratosis: Secondary | ICD-10-CM | POA: Diagnosis not present

## 2013-04-16 DIAGNOSIS — L723 Sebaceous cyst: Secondary | ICD-10-CM | POA: Diagnosis not present

## 2013-04-16 DIAGNOSIS — L719 Rosacea, unspecified: Secondary | ICD-10-CM | POA: Diagnosis not present

## 2013-04-16 DIAGNOSIS — D485 Neoplasm of uncertain behavior of skin: Secondary | ICD-10-CM | POA: Diagnosis not present

## 2013-04-23 DIAGNOSIS — K5732 Diverticulitis of large intestine without perforation or abscess without bleeding: Secondary | ICD-10-CM | POA: Diagnosis not present

## 2013-04-23 DIAGNOSIS — R1032 Left lower quadrant pain: Secondary | ICD-10-CM | POA: Diagnosis not present

## 2013-04-23 DIAGNOSIS — D126 Benign neoplasm of colon, unspecified: Secondary | ICD-10-CM | POA: Diagnosis not present

## 2013-04-30 ENCOUNTER — Telehealth: Payer: Self-pay | Admitting: Family Medicine

## 2013-04-30 NOTE — Telephone Encounter (Signed)
i left a message on pt's phone that the new gastroenterologist reviewed his scope report and said that dr jenkin's plan to take out a segment of the colon to remove this large polyp is exactly the right thing to do. If he'd like to see yet another gen sur, we can arrange

## 2013-04-30 NOTE — Telephone Encounter (Signed)
Patient saw you back in November and wanting to know if you got his records from [place] yet and if so have you had chance to review them yet. And what do you recommend about his future surgery.

## 2013-05-08 ENCOUNTER — Ambulatory Visit (INDEPENDENT_AMBULATORY_CARE_PROVIDER_SITE_OTHER): Payer: Medicare Other | Admitting: Family Medicine

## 2013-05-08 ENCOUNTER — Encounter: Payer: Self-pay | Admitting: Family Medicine

## 2013-05-08 VITALS — BP 134/92 | Temp 98.6°F | Ht 73.0 in | Wt 211.0 lb

## 2013-05-08 DIAGNOSIS — Z23 Encounter for immunization: Secondary | ICD-10-CM | POA: Diagnosis not present

## 2013-05-08 DIAGNOSIS — J329 Chronic sinusitis, unspecified: Secondary | ICD-10-CM | POA: Diagnosis not present

## 2013-05-08 DIAGNOSIS — J310 Chronic rhinitis: Secondary | ICD-10-CM

## 2013-05-08 MED ORDER — SULFAMETHOXAZOLE-TMP DS 800-160 MG PO TABS: 1.0000 | ORAL_TABLET | Freq: Two times a day (BID) | ORAL | Status: DC

## 2013-05-08 NOTE — Progress Notes (Signed)
   Subjective:    Patient ID: [name] [name], male    DOB: 1941-01-19, 72 y.o.   MRN: [id number]  Sinusitis This is a new problem. The current episode started in the past 7 days. Associated symptoms include congestion, coughing, headaches and sinus pressure. Past treatments include oral decongestants.   headachae and congestion, off and on for about two wks  nuc d and cod tabs dull headach off and on for three or for days  Notes eust tube congestion   felt rough today, felt sick and ongoing discomfort, Ans ongoing stuffiness and congeston mucinex d and alka seltzer cold  Review of Systems  HENT: Positive for congestion and sinus pressure.   Respiratory: Positive for cough.   Neurological: Positive for headaches.   ROS otherwise negative    Objective:   Physical Exam Alert mild malaise. Frontal mass or tenderness. Trace erythematous neck supple. Lungs clear heart rare in rhythm.       Assessment & Plan:  No vomiting no diarrhea no rash impression 1 acute rhinosinusitis #2 pending colon surgery discussed plan pneumonia shot. Bactrim DS twice a day 10 days. Symptomatic care discussed. WSL

## 2013-05-14 DIAGNOSIS — K5732 Diverticulitis of large intestine without perforation or abscess without bleeding: Secondary | ICD-10-CM | POA: Diagnosis not present

## 2013-05-16 ENCOUNTER — Encounter (HOSPITAL_COMMUNITY): Payer: Self-pay | Admitting: Pharmacy Technician

## 2013-05-16 NOTE — H&P (Signed)
  [facility] SOAP Note  Vital Signs:  Vitals as of: 05/15/2013: Systolic 141: Diastolic 91: Heart Rate 73: Temp 97.41F: Height 74ft 1in: Weight 212Lbs 0 Ounces: BMI 27.97  BMI : 27.97 kg/m2  Subjective: This 72 Years  old Male presents for a colon polyp that could not be removed endoscopically.  Biopsy revealed an adenoma, no malignancy seen.  Large polyp seen in proximal transverse colon.  Review of Symptoms:  Constitutional:unremarkable   Head:unremarkable    Eyes:unremarkable   Nose/Mouth/Throat:unremarkable Cardiovascular:  unremarkable   Respiratory:  dyspnea Gastrointestin    heartburn Genitourinary:unremarkable       joint and back pain h/o skin cancer removal Hematolgic/Lymphatic:unremarkable       hay fever   Past Medical History:    Reviewed   Past Medical History  Surgical History: lumbar laminectomy, angioplasty, cholecystectomy, cataract surgery Medical Problems: CAD, HTN, asthma, high cholesterol Allergies: pcn, tetracycline, neomycin, cipro Medications: diltiazem, lovastatin, nexium, zyrtec, albuterol, baby asa, singulair, zantac   Social History:Reviewed  Social History  Preferred Language: English Race:  White Ethnicity: Not Hispanic / Latino Age: 64 Years  Marital Status:  M Alcohol:  No Recreational drug(s):  No   Smoking Status: Former smoker reviewed on 05/15/2013 Started Date: 05/18/1959 Stopped Date: 05/18/2003 Functional Status reviewed on mm/dd/yyyy ------------------------------------------------ Bathing: Normal Cooking: Normal Dressing: Normal Driving: Normal Eating: Normal Managing Meds: Normal Oral Care: Normal Shopping: Normal Toileting: Normal Transferring: Normal Walking: Normal Cognitive Status reviewed on mm/dd/yyyy ------------------------------------------------ Attention: Normal Decision Making: Normal Language: Normal Memory: Normal Motor: Normal Perception: Normal Problem  Solving: Normal Visual and Spatial: Normal   Family History:  Reviewed  Family Health History Mother, Deceased; Heart disease;  Father, Deceased; Healthy;     Objective Information: General:  Well appearing, well nourished in no distress. Neck:  Supple without lymphadenopathy.  Heart:  RRR, no murmur Lungs:    CTA bilaterally, no wheezes, rhonchi, rales.  Breathing unlabored. Abdomen:Soft, NT/ND, no HSM, no masses.  Assessment:Colon polyp  Diagnosis & Procedure Smart Code   Plan:Will need laparoscopic handassisted partial colectomy (right).    Patient Education:Alternative treatments to surgery were discussed with patient (and family).  Risks and benefits  of procedure including bleeding, the possibility of an open procedure, blood transfusion, and infection were fully explained to the patient (and family) who gave informed consent. Patient/family questions were addressed.  Follow-up:Pending Surgery                              Active Diagnosis and Procedures: 211.3 Benign neoplasm of colon   99499 - UNLISTED EVALUATION AND MANAGEMENT SERVICE

## 2013-05-18 ENCOUNTER — Other Ambulatory Visit: Payer: Self-pay | Admitting: Family Medicine

## 2013-05-22 ENCOUNTER — Ambulatory Visit (INDEPENDENT_AMBULATORY_CARE_PROVIDER_SITE_OTHER): Payer: Medicare Other | Admitting: Cardiovascular Disease

## 2013-05-22 ENCOUNTER — Encounter: Payer: Self-pay | Admitting: Cardiovascular Disease

## 2013-05-22 VITALS — BP 142/80 | HR 80 | Ht 73.0 in | Wt 213.8 lb

## 2013-05-22 DIAGNOSIS — I2510 Atherosclerotic heart disease of native coronary artery without angina pectoris: Secondary | ICD-10-CM

## 2013-05-22 DIAGNOSIS — J449 Chronic obstructive pulmonary disease, unspecified: Secondary | ICD-10-CM

## 2013-05-22 DIAGNOSIS — I714 Abdominal aortic aneurysm, without rupture, unspecified: Secondary | ICD-10-CM | POA: Diagnosis not present

## 2013-05-22 DIAGNOSIS — E782 Mixed hyperlipidemia: Secondary | ICD-10-CM

## 2013-05-22 NOTE — Progress Notes (Signed)
Patient ID: [name] [name], male   DOB: 20-Jul-1940, 73 y.o.   MRN: [id number] [name] [name] is seen today in followup for his coronary artery disease. 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 Olevia Perches I believe back in 1999. His last Myoview in July of 2009 was nonischemic. He has good LV function. His been compliant with his meds. He has a 3.6 x 3.4 cm AAA which needs a followup duplex 9/14 He has not had t any abdominal pain. He is active. 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 He prefers to have his duplex at [facility]. He continues to see Mickie Hillier for his primary care needs.  Told him I would like him to take Toprol for HTN, AAA and PAC;s /SVT  Reviewed event monitor from 5/9-10/22/10 PAC;s and a few short bursts of SVT 4 beats. Low dose Toprol started last visit in June. Patient felt no different on it and stopped it. No palpitations.  In f/u he did not tolerated Toprol or bystolic with multiple side effects.  Interestingly diarrhea occurred with both  Now tolerating calcium blocker   Has some back problems  Has had injection  Having surgery to remove a polyp with Dr Arnoldo Morale next week.  Apparently it could not be removed during colonoscopy.   Should be fine from cardiac stand point.  No angina and very active.        ROS: Denies fever, malais, weight loss, blurry vision, decreased visual acuity, cough, sputum, SOB, hemoptysis, pleuritic pain, palpitaitons, heartburn, abdominal pain, melena, lower extremity edema, claudication, or rash.  All other systems reviewed and negative  General: Affect appropriate Healthy:  appears stated age 3: normal Neck supple with no adenopathy JVP normal no bruits no thyromegaly Lungs clear with no wheezing and good diaphragmatic motion Heart:  S1/S2 no murmur, no rub, gallop or click PMI normal Abdomen: benighn, BS positve, no tenderness, no AAA no bruit.  No HSM  or HJR Distal pulses intact with no bruits No edema Neuro non-focal Skin warm and dry No muscular weakness   Current Outpatient Prescriptions  Medication Sig Dispense Refill  . albuterol (PROVENTIL HFA;VENTOLIN HFA) 108 (90 BASE) MCG/ACT inhaler Inhale 2 puffs into the lungs every 6 (six) hours as needed for wheezing.  1 Inhaler  5  . albuterol (PROVENTIL) (2.5 MG/3ML) 0.083% nebulizer solution Take 2.5 mg by nebulization every 6 (six) hours as needed for wheezing.      Marland Kitchen aspirin EC 81 MG tablet Take 81 mg by mouth daily.      . cetirizine (ZYRTEC) 10 MG chewable tablet Chew 10 mg by mouth daily.        Marland Kitchen diltiazem (CARDIZEM CD) 120 MG 24 hr capsule Take 1 capsule (120 mg total) by mouth daily.  90 capsule  3  . esomeprazole (NEXIUM) 40 MG capsule Take 40 mg by mouth daily before breakfast.        . ketorolac (ACULAR) 0.5 % ophthalmic solution Place 1 drop into the left eye 2 (two) times daily.       Marland Kitchen lovastatin (MEVACOR) 40 MG tablet Take 40 mg by mouth at bedtime.      . lovastatin (MEVACOR) 40 MG tablet TAKE 2 TABLETS BY MOUTH EVERY DAY  60 tablet  5  . montelukast (SINGULAIR) 10 MG tablet Take 1 tablet (10 mg total) by mouth at bedtime as  needed. Allergies.  30 tablet  5  . nitroGLYCERIN (NITROSTAT) 0.4 MG SL tablet Place 1 tablet (0.4 mg total) under the tongue every 5 (five) minutes as needed.  90 tablet  3  . prednisoLONE acetate (PRED FORTE) 1 % ophthalmic suspension Place 1 drop into the left eye 2 (two) times daily.        No current facility-administered medications for this visit.    Allergies  Beta adrenergic blockers; Cefzil; Dexamethasone; Gatifloxacin; Methocarbamol; Neomycin; Tetracyclines & related; Ciprofloxacin; and Penicillins  Electrocardiogram:  07/17/12  SR rate 80  Normal   Assessment and Plan

## 2013-05-22 NOTE — Assessment & Plan Note (Signed)
Reviewed [place] from 01/19/12 done at [facility] AAA size 3.6 cm  F/U duplex 3/15

## 2013-05-22 NOTE — Assessment & Plan Note (Signed)
Cholesterol is at goal.  Continue current dose of statin and diet Rx.  No myalgias or side effects.  F/U  LFT's in 6 months. Lab Results  Component Value Date   LDLCALC 83 03/26/2013

## 2013-05-22 NOTE — Assessment & Plan Note (Signed)
Stable no active wheezing  Cannot tolerate beta blockers  Continue inhalers  Needs preop CXR

## 2013-05-22 NOTE — Patient Instructions (Addendum)
Your physician wants you to follow-up in:  6 MONTHS WITH DR NISHAN  You will receive a reminder letter in the mail two months in advance. If you don't receive a letter, please call our office to schedule the follow-up appointment. Your physician recommends that you continue on your current medications as directed. Please refer to the Current Medication list given to you today. 

## 2013-05-22 NOTE — Assessment & Plan Note (Signed)
Stable with no angina and good activity level.  Continue medical Rx No need for myovue before surgery for colon polyp  Clear to have surgery With Dr Arnoldo Morale

## 2013-05-23 ENCOUNTER — Encounter: Payer: Self-pay | Admitting: Family Medicine

## 2013-05-23 ENCOUNTER — Ambulatory Visit (INDEPENDENT_AMBULATORY_CARE_PROVIDER_SITE_OTHER): Payer: Medicare Other | Admitting: Family Medicine

## 2013-05-23 VITALS — BP 122/90 | Temp 98.2°F | Ht 73.0 in | Wt 214.0 lb

## 2013-05-23 DIAGNOSIS — J310 Chronic rhinitis: Secondary | ICD-10-CM

## 2013-05-23 DIAGNOSIS — J329 Chronic sinusitis, unspecified: Secondary | ICD-10-CM | POA: Diagnosis not present

## 2013-05-23 MED ORDER — CEFDINIR 300 MG PO CAPS: 300.0000 mg | ORAL_CAPSULE | Freq: Two times a day (BID) | ORAL | Status: DC

## 2013-05-23 MED ORDER — FENTANYL CITRATE 0.05 MG/ML IJ SOLN: 25.0000 ug | INTRAMUSCULAR | Status: DC | PRN

## 2013-05-23 MED ORDER — ONDANSETRON HCL 4 MG/2ML IJ SOLN: 4.0000 mg | Freq: Once | INTRAMUSCULAR | Status: AC | PRN

## 2013-05-23 NOTE — Progress Notes (Signed)
   Subjective:    Patient ID: [name] [name], male    DOB: 1940-12-15, 73 y.o.   MRN: [id number]  Sinusitis This is a new problem. The current episode started yesterday. The problem is unchanged. There has been no fever. The pain is moderate. Associated symptoms include congestion, headaches, a hoarse voice and sinus pressure. Past treatments include oral decongestants.   Pos nasal cong in sinus, a little irrit in ht chest  No nocte cough  Due to have surgery next week with partial colectomy Review of Systems  HENT: Positive for congestion, hoarse voice and sinus pressure.   Neurological: Positive for headaches.   no vomiting no diarrhea no rash ROS otherwise negative     Objective:   Physical Exam  Alert hydration good. Worse for this. Frontal maxillary tenderness. Positive nasal congestion. Pharynx slight erythema neck supple. Lungs clear. Heart regular in rhythm.      Assessment & Plan:  Impression acute rhinosinusitis plan Omnicef 300 twice a day 10 days. Since Medicare discussed. WSL

## 2013-05-23 NOTE — Patient Instructions (Signed)
[name] [name]  05/23/2013   Your procedure is scheduled on:   05/28/2013  Report to [facility] at  830  AM.  Call this number if you have problems the morning of surgery: [phone]   Remember:   Do not eat food or drink liquids after midnight.   Take these medicines the morning of surgery with A SIP OF WATER:  Cardiazem, nexium, singulair   Do not wear jewelry, make-up or nail polish.  Do not wear lotions, powders, or perfumes.   Do not shave 48 hours prior to surgery. Men may shave face and neck.  Do not bring valuables to the hospital.  [facility] is not responsible for any belongings or valuables.               Contacts, dentures or bridgework may not be worn into surgery.  Leave suitcase in the car. After surgery it may be brought to your room.  For patients admitted to the hospital, discharge time is determined by your treatment team.               Patients discharged the day of surgery will not be allowed to drive home.  Name and phone number of your driver: family  Special Instructions: Shower using CHG 2 nights before surgery and the night before surgery.  If you shower the day of surgery use CHG.  Use special wash - you have one bottle of CHG for all showers.  You should use approximately 1/3 of the bottle for each shower.   Please read over the following fact sheets that you were given: Pain Booklet, Coughing and Deep Breathing, Surgical Site Infection Prevention, Anesthesia Post-op Instructions and Care and Recovery After Surgery Laparoscopic Colon Resection Laparoscopic colon resection is a relatively new procedure and is not performed in all centers. It may be done to remove a piece of the colon (large intestine) that may be sore and reddened (inflamed). It may be done to remove a portion of bowel that is blocked. The intestine may be blocked because of colon cancer. It is sometimes used to treat diseases of the bowel in which there are multiple small outgrowths from  the bowel wall (polyps), which may predispose a person to cancer. LET YOUR CAREGIVER KNOW ABOUT:  Allergies.  Medications taken including herbs, eye drops, over the counter medications, and creams.  Use of steroids (by mouth or creams).  Previous problems with anesthetics or novocaine.  Possibility of pregnancy, if this applies.  History of blood clots (thrombophlebitis).  History of bleeding or blood problems.  Previous surgery.  Other health problems. RISKS AND COMPLICATIONS Some problems, which occur following this procedure, include:  Infection: A germ starts growing in the wound. This can usually be treated with medicine that kills germs (antibiotics).  Bleeding following surgery may be a complication of almost all surgeries. Your surgeon takes every precaution to keep this from happening.  Damage to other organs may occur. If damage to other organs or excessive bleeding should occur it may be necessary to convert the laparoscopic procedure into an open abdominal (belly) procedure. This means the surgery is performed by opening the abdomen and performing the surgery under direct vision. Scarring from previous surgeries or disease may also be a cause to change this procedure to an open abdominal operation.  Sometimes a leak can occur in the line where the bowel was sewn together after the portion of bowel was removed.  It is possible  for the bowel to become obstructed in the area where it was sewn together. When this happens, it is sometimes necessary to operate again to repair this. This may be accomplished using the laparoscope or opening the abdomen and operating in the usual manner without the laparoscope. BEFORE THE PROCEDURE You should be present 2 hours prior to your procedure or as instructed.  PROCEDURE  Laparoscopic means a laparoscope (a small pencil sized telescope) is used. You are made to sleep with medicine (anesthetized). Your surgeon inflates your belly  (abdomen) with a needle like device (trocar and cannula). The inflation is done with a harmless gas (carbon dioxide). This makes your organs easier to see. The laparoscope is inserted into your abdomen through a small slit (incision) that allows your surgeon to see into the abdomen. Other small instruments, such as probes and operating instruments, are inserted into the abdomen through other small openings (ports). These ports allow the surgeon to perform the operation. Often surgeons attach a video camera to the laparoscope to enlarge the view. During the procedure the portion of bowel to be removed is taken out through one of the ports. A port may have to be enlarged if the bowel is too large to be removed. In this case a small incision will be made and some times the bowel is reconnected (anastamosis) outside the abdomen. After the procedure, the gas is released, and your incisions are closed with stitches (sutures). Because these incisions are small (usually less than one-half inch), there is usually minimal discomfort following the procedure. AFTER THE PROCEDURE The recovery time, if there are no problems, is shortened compared to regular surgery. You will rest in a recovery room until you are stable and doing well. Following this, barring other problems you will be allowed to return to your room. Recovery times vary depending on what is found at surgery, the age of the patient, general health, etc. SEEK IMMEDIATE MEDICAL CARE IF:   There is redness, swelling, or increasing pain in the wound area.  Pus is coming from the wound.  An unexplained oral temperature above 102 F (38.9 C) develops or as directed.  You notice a foul smell coming from the wound or dressing.  There is a breaking open of a wound (edges not staying together) after sutures have been removed.  You develop increasing abdominal pain. Document Released: 07/24/2002 Document Revised: 07/26/2011 Document Reviewed:  06/02/2007 [facility] Patient Information 2014 [place], [place]. PATIENT INSTRUCTIONS POST-ANESTHESIA  IMMEDIATELY FOLLOWING SURGERY:  Do not drive or operate machinery for the first twenty four hours after surgery.  Do not make any important decisions for twenty four hours after surgery or while taking narcotic pain medications or sedatives.  If you develop intractable nausea and vomiting or a severe headache please notify your doctor immediately.  FOLLOW-UP:  Please make an appointment with your surgeon as instructed. You do not need to follow up with anesthesia unless specifically instructed to do so.  WOUND CARE INSTRUCTIONS (if applicable):  Keep a dry clean dressing on the anesthesia/puncture wound site if there is drainage.  Once the wound has quit draining you may leave it open to air.  Generally you should leave the bandage intact for twenty four hours unless there is drainage.  If the epidural site drains for more than 36-48 hours please call the anesthesia department.  QUESTIONS?:  Please feel free to call your physician or the hospital operator if you have any questions, and they will be happy to assist  you.

## 2013-05-24 ENCOUNTER — Telehealth: Payer: Self-pay | Admitting: Family Medicine

## 2013-05-24 ENCOUNTER — Other Ambulatory Visit: Payer: Self-pay | Admitting: *Deleted

## 2013-05-24 ENCOUNTER — Encounter (HOSPITAL_COMMUNITY)
Admission: RE | Admit: 2013-05-24 | Discharge: 2013-05-24 | Disposition: A | Discharge status: Home / Self Care | Payer: Medicare Other | Source: Ambulatory Visit | Attending: General Surgery | Admitting: General Surgery

## 2013-05-24 MED ORDER — SULFAMETHOXAZOLE-TMP DS 800-160 MG PO TABS: 1.0000 | ORAL_TABLET | Freq: Two times a day (BID) | ORAL | Status: AC

## 2013-05-24 NOTE — Telephone Encounter (Signed)
Patient was given an Rx yesterday for cefdinir yesterday, but patient says that it gives him terrible acid reflux and wants something else called in to [facility]  [place].

## 2013-05-24 NOTE — Telephone Encounter (Signed)
Nurses--document in "allergy" list. Bactrim ds bid ten d

## 2013-05-24 NOTE — Telephone Encounter (Signed)
Med sent, patient notified.  

## 2013-05-28 ENCOUNTER — Encounter: Payer: Self-pay | Admitting: Family Medicine

## 2013-05-28 ENCOUNTER — Ambulatory Visit (INDEPENDENT_AMBULATORY_CARE_PROVIDER_SITE_OTHER): Payer: Medicare Other | Admitting: Family Medicine

## 2013-05-28 VITALS — BP 130/82 | Ht 73.0 in | Wt 207.0 lb

## 2013-05-28 DIAGNOSIS — B370 Candidal stomatitis: Secondary | ICD-10-CM | POA: Diagnosis not present

## 2013-05-28 NOTE — Progress Notes (Signed)
   Subjective:    Patient ID: [name] [name], male    DOB: Dec 12, 1940, 73 y.o.   MRN: [id number]  HPI Patient arrives with complaint of thrush. Mouth is very sore and tender-burning and feels swollen. Patient is currently on Bactrim for sinus infection.   Patient has also noticed tremors in hands for last few days. States he has not no significant tremors in the past. Admits to a lot of stress and anxiety. Was due to have major surgery today. But canceled it.  Took bactrim, then started developing thrush symptoms. Notes white patch on tongue intermittently, inflamed irritated rash between upper lip and gone inflamed swollen tender.  Pt notes rash, just on the face overlying inflamed areas of the common. Review of Systems No headache no chest pain no back pain no change about habits no blood in stool ROS otherwise    Objective:   Physical Exam Alert anxious appearing HEENT no nasal congestion patchy erythematous, with white patches irritated inflamed region between comes and upper lip. Scaly patch on skin no rash elsewhere lungs clear. Heart regular in rhythm. Hands very fine essential tremor no cerebellar dysfunction noted       Assessment & Plan:  Impression 1 findings tremor likely exacerbation of chronic tremor due to stress discussed #2 rhinosinusitis result. #3 questionable Bactrim reaction highly doubt discussed with patient. #4 Omnicef reaction reflux from cephalosporin discussed #5 thrush discussed plan clotrimazole  10 mg 5 times a day for week. Takes Magic mouthwash swish and spit 4 times a day for 10 days. 25 minutes spent most in discussion WSL

## 2013-06-05 NOTE — Patient Instructions (Signed)
[name] [name]  06/05/2013   Your procedure is scheduled on:  06/11/2013  Report to [facility] at  64  AM.  Call this number if you have problems the morning of surgery: [phone]   Remember:   Do not eat food or drink liquids after midnight.   Take these medicines the morning of surgery with A SIP OF WATER: zyrtec, diltiazem, nexium, singulair. Take your proventil nebulizer before you come and bring your inhaler with you when you come.   Do not wear jewelry, make-up or nail polish.  Do not wear lotions, powders, or perfumes.   Do not shave 48 hours prior to surgery. Men may shave face and neck.  Do not bring valuables to the hospital.  [facility] is not responsible for any belongings or valuables.               Contacts, dentures or bridgework may not be worn into surgery.  Leave suitcase in the car. After surgery it may be brought to your room.  For patients admitted to the hospital, discharge time is determined by your treatment team.               Patients discharged the day of surgery will not be allowed to drive home.  Name and phone number of your driver: family  Special Instructions: Shower using CHG 2 nights before surgery and the night before surgery.  If you shower the day of surgery use CHG.  Use special wash - you have one bottle of CHG for all showers.  You should use approximately 1/3 of the bottle for each shower.   Please read over the following fact sheets that you were given: Pain Booklet, Coughing and Deep Breathing, Blood Transfusion Information, Surgical Site Infection Prevention, Anesthesia Post-op Instructions and Care and Recovery After Surgery Laparoscopic Colectomy Laparoscopic colectomy is surgery to remove part or all of the large intestine (colon). This procedure is used to treat several conditions, including:  Inflammation and infection of the colon (diverticulitis).  Tumors or masses in the colon.  Inflammatory bowel disease, such as Crohn  disease or ulcerative colitis. Colectomy is an option when symptoms cannot be controlled with medicines.  Bleeding from the colon that cannot be controlled by another method.  Blockage or obstruction of the colon. LET [facility] CARE PROVIDER KNOW ABOUT:  Any allergies you have.  All medicines you are taking, including vitamins, herbs, eye drops, creams, and over-the-counter medicines.  Previous problems you or members of your family have had with the use of anesthetics.  Any blood disorders you have.  Previous surgeries you have had.  Medical conditions you have. RISKS AND COMPLICATIONS Generally, this is a safe procedure. However, as with any procedure, complications can occur. Possible complications include:  Infection.  Bleeding.  Damage to other organs.  Leaking from where the colon was sewn together.  Future blockage of the small intestines from scar tissue. Another surgery may be needed to repair this. In some cases, complications such as damage to other organs or excessive bleeding may require the surgeon to convert from a laparoscopic procedure to an open procedure. This involves making a larger incision in the abdomen to perform the procedure. BEFORE THE PROCEDURE  Ask your health care provider about changing or stopping any regular medicines.  You may be prescribed an oral bowel prep. This involves drinking a large amount of medicated liquid, starting the day before your surgery. The liquid will  cause you to have multiple loose stools until your stool is almost clear or light green. This cleans out your colon in preparation for the surgery.  Do not eat or drink anything else once you have started the bowel prep, unless your health care provider tells you it is safe to do so.  You may also be given antibiotic pills to clean out your colon of bacteria. Be sure to follow the directions carefully and take the medicine at the correct time. PROCEDURE   Small monitors  will be put on your body. They are used to check your heart, blood pressure, and oxygen level.  An IV access tube will be put into one of your veins. Medicine will be able to flow directly into your body through this IV tube.  You might be given a medicine to help you relax (sedative).  You will be given a medicine to make you sleep through the procedure (general anesthetic). A breathing tube may be placed into your lungs during the procedure.  A thin, flexible tube (catheter) will be placed into your bladder to collect urine.  A tube may be put in through your nose. It is called a nasogastric tube. It is used to remove stomach fluids after surgery until the intestines start working again.  Your abdomen will be filled with air so that it expands. This gives the surgeon more room to operate and makes your organs easier to see.  Several small cuts (incisions) are made in your abdomen.  A thin, lighted tube with a tiny camera on the end (laparoscope) is put through one of the small incisions. The camera on the laparoscope sends a picture to a TV screen in the operating room. This gives the surgeon a good view inside your abdomen.  Hollow tubes are put through the other small incisions in your abdomen. The tools needed for the procedure are put through these tubes.  Clamps or staples are put on both ends of the diseased part of the colon.  The part of the intestine between the clamps or staples is removed.  If possible, the ends of the healthy colon that remain will be stitched or stapled together to allow your body to expel waste (stool).  Sometimes, the remaining colon cannot be stitched back together. If this is the case, a colostomy is needed. For a colostomy:  An opening (stoma) to the outside of your body is made through the abdomen.  The end of the colon is brought to the opening. It is stitched to the skin.  A bag is attached to the opening. Stool will drain into this bag. The bag  is removable.  The colostomy can be temporary or permanent.  The incisions from the colectomy are closed with stitches or staples. AFTER THE PROCEDURE  You will be monitored closely in a recovery area until you are stable and doing well. You will then be moved to a regular hospital room.  You will need to receive fluids through an IV tube until your bowel function has returned. This may take 1 3 days. Once your bowels are working again, you will be started on clear liquids and then advanced to solid food as tolerated.  You will be given pain medicines to control your pain. Document Released: 07/24/2002 Document Revised: 02/21/2013 Document Reviewed: 12/13/2012 [facility] Patient Information 2014 [place], [place]. PATIENT INSTRUCTIONS POST-ANESTHESIA  IMMEDIATELY FOLLOWING SURGERY:  Do not drive or operate machinery for the first twenty four hours after surgery.  Do  not make any important decisions for twenty four hours after surgery or while taking narcotic pain medications or sedatives.  If you develop intractable nausea and vomiting or a severe headache please notify your doctor immediately.  FOLLOW-UP:  Please make an appointment with your surgeon as instructed. You do not need to follow up with anesthesia unless specifically instructed to do so.  WOUND CARE INSTRUCTIONS (if applicable):  Keep a dry clean dressing on the anesthesia/puncture wound site if there is drainage.  Once the wound has quit draining you may leave it open to air.  Generally you should leave the bandage intact for twenty four hours unless there is drainage.  If the epidural site drains for more than 36-48 hours please call the anesthesia department.  QUESTIONS?:  Please feel free to call your physician or the hospital operator if you have any questions, and they will be happy to assist you.

## 2013-06-06 ENCOUNTER — Encounter (HOSPITAL_COMMUNITY)
Admission: RE | Admit: 2013-06-06 | Discharge: 2013-06-06 | Disposition: A | Discharge status: Home / Self Care | Payer: Medicare Other | Source: Ambulatory Visit | Attending: General Surgery | Admitting: General Surgery

## 2013-06-06 ENCOUNTER — Ambulatory Visit (HOSPITAL_COMMUNITY): Admission: RE | Admit: 2013-06-06 | Payer: Medicare Other | Source: Ambulatory Visit

## 2013-06-06 ENCOUNTER — Other Ambulatory Visit: Payer: Self-pay

## 2013-06-06 ENCOUNTER — Encounter (HOSPITAL_COMMUNITY): Payer: Self-pay

## 2013-06-06 ENCOUNTER — Ambulatory Visit (HOSPITAL_COMMUNITY)
Admission: RE | Admit: 2013-06-06 | Discharge: 2013-06-06 | Disposition: A | Discharge status: Home / Self Care | Payer: Medicare Other | Source: Ambulatory Visit | Attending: General Surgery | Admitting: General Surgery

## 2013-06-06 DIAGNOSIS — I10 Essential (primary) hypertension: Secondary | ICD-10-CM | POA: Insufficient documentation

## 2013-06-06 LAB — COMPREHENSIVE METABOLIC PANEL
ALT: 20 U/L (ref 0–53)
AST: 18 U/L (ref 0–37)
Albumin: 3.7 g/dL (ref 3.5–5.2)
Alkaline Phosphatase: 71 U/L (ref 39–117)
BUN: 11 mg/dL (ref 6–23)
CO2: 29 mEq/L (ref 19–32)
Calcium: 9.4 mg/dL (ref 8.4–10.5)
Chloride: 101 mEq/L (ref 96–112)
Creatinine, Ser: 0.96 mg/dL (ref 0.50–1.35)
GFR calc Af Amer: >90 mL/min (ref >90)
GFR calc non Af Amer: 81 mL/min — ABNORMAL LOW (ref >90)
Glucose, Bld: 92 mg/dL (ref 70–99)
Potassium: 4.3 mEq/L (ref 3.7–5.3)
Sodium: 139 mEq/L (ref 137–147)
Total Bilirubin: 0.5 mg/dL (ref 0.3–1.2)
Total Protein: 7.2 g/dL (ref 6.0–8.3)

## 2013-06-06 LAB — CBC WITH DIFFERENTIAL/PLATELET
Basophils Absolute: 0.1 10*3/uL (ref 0.0–0.1)
Basophils Relative: 1 % (ref 0–1)
Eosinophils Absolute: 0.3 10*3/uL (ref 0.0–0.7)
Eosinophils Relative: 2 % (ref 0–5)
HCT: 43.6 % (ref 39.0–52.0)
Hemoglobin: 14.2 g/dL (ref 13.0–17.0)
Lymphocytes Relative: 15 % (ref 12–46)
Lymphs Abs: 1.6 10*3/uL (ref 0.7–4.0)
MCH: 28.5 pg (ref 26.0–34.0)
MCHC: 32.6 g/dL (ref 30.0–36.0)
MCV: 87.6 fL (ref 78.0–100.0)
Monocytes Absolute: 0.9 10*3/uL (ref 0.1–1.0)
Monocytes Relative: 9 % (ref 3–12)
Neutro Abs: 7.5 10*3/uL (ref 1.7–7.7)
Neutrophils Relative %: 73 % (ref 43–77)
Platelets: 224 10*3/uL (ref 150–400)
RBC: 4.98 MIL/uL (ref 4.22–5.81)
RDW: 13.9 % (ref 11.5–15.5)
WBC: 10.3 10*3/uL (ref 4.0–10.5)

## 2013-06-06 LAB — TYPE AND SCREEN
ABO/RH(D): O POS
Antibody Screen: NEGATIVE

## 2013-06-11 ENCOUNTER — Encounter (HOSPITAL_COMMUNITY): Payer: Medicare Other | Admitting: Anesthesiology

## 2013-06-11 ENCOUNTER — Inpatient Hospital Stay (HOSPITAL_COMMUNITY): Payer: Medicare Other | Admitting: Anesthesiology

## 2013-06-11 ENCOUNTER — Encounter (HOSPITAL_COMMUNITY)
Admission: RE | Disposition: A | Discharge status: Home / Self Care | Payer: Self-pay | Source: Ambulatory Visit | Attending: General Surgery

## 2013-06-11 ENCOUNTER — Inpatient Hospital Stay (HOSPITAL_COMMUNITY)
Admission: RE | Admit: 2013-06-11 | Discharge: 2013-06-14 | DRG: 331 | Disposition: A | Discharge status: Home / Self Care | Payer: Medicare Other | Source: Ambulatory Visit | Attending: General Surgery | Admitting: General Surgery

## 2013-06-11 ENCOUNTER — Encounter (HOSPITAL_COMMUNITY): Payer: Self-pay | Admitting: *Deleted

## 2013-06-11 DIAGNOSIS — I2510 Atherosclerotic heart disease of native coronary artery without angina pectoris: Secondary | ICD-10-CM | POA: Diagnosis not present

## 2013-06-11 DIAGNOSIS — D126 Benign neoplasm of colon, unspecified: Secondary | ICD-10-CM | POA: Diagnosis not present

## 2013-06-11 DIAGNOSIS — I10 Essential (primary) hypertension: Secondary | ICD-10-CM | POA: Diagnosis present

## 2013-06-11 DIAGNOSIS — D490 Neoplasm of unspecified behavior of digestive system: Secondary | ICD-10-CM | POA: Diagnosis not present

## 2013-06-11 DIAGNOSIS — J45909 Unspecified asthma, uncomplicated: Secondary | ICD-10-CM | POA: Diagnosis present

## 2013-06-11 DIAGNOSIS — E7800 Pure hypercholesterolemia, unspecified: Secondary | ICD-10-CM | POA: Diagnosis not present

## 2013-06-11 DIAGNOSIS — Z87891 Personal history of nicotine dependence: Secondary | ICD-10-CM

## 2013-06-11 HISTORY — PX: LAPAROSCOPIC PARTIAL COLECTOMY: SHX5907

## 2013-06-11 SURGERY — LAPAROSCOPIC PARTIAL COLECTOMY
Anesthesia: General | Site: Abdomen

## 2013-06-11 MED ORDER — ROCURONIUM BROMIDE 100 MG/10ML IV SOLN
INTRAVENOUS | Status: DC | PRN
  Administered 2013-06-11: 20 mg via INTRAVENOUS
  Administered 2013-06-11: 50 mg via INTRAVENOUS

## 2013-06-11 MED ORDER — ONDANSETRON HCL 4 MG PO TABS: 4.0000 mg | ORAL_TABLET | Freq: Four times a day (QID) | ORAL | Status: DC | PRN

## 2013-06-11 MED ORDER — ENOXAPARIN SODIUM 40 MG/0.4ML ~~LOC~~ SOLN
40.0000 mg | Freq: Once | SUBCUTANEOUS | Status: AC
  Administered 2013-06-11: 40 mg via SUBCUTANEOUS
  Filled 2013-06-11: qty 0.4

## 2013-06-11 MED ORDER — ALVIMOPAN 12 MG PO CAPS
12.0000 mg | ORAL_CAPSULE | Freq: Once | ORAL | Status: AC
  Administered 2013-06-11: 12 mg via ORAL

## 2013-06-11 MED ORDER — MIDAZOLAM HCL 2 MG/2ML IJ SOLN
1.0000 mg | INTRAMUSCULAR | Status: DC | PRN
  Administered 2013-06-11: 2 mg via INTRAVENOUS

## 2013-06-11 MED ORDER — POVIDONE-IODINE 10 % OINT PACKET
TOPICAL_OINTMENT | CUTANEOUS | Status: DC | PRN
  Administered 2013-06-11: 1 via TOPICAL

## 2013-06-11 MED ORDER — PROPOFOL 10 MG/ML IV BOLUS
INTRAVENOUS | Status: DC | PRN
  Administered 2013-06-11: 140 mg via INTRAVENOUS

## 2013-06-11 MED ORDER — LORATADINE 10 MG PO TABS
10.0000 mg | ORAL_TABLET | Freq: Every day | ORAL | Status: DC
  Administered 2013-06-12 – 2013-06-14 (×3): 10 mg via ORAL
  Filled 2013-06-11 (×5): qty 1

## 2013-06-11 MED ORDER — ONDANSETRON HCL 4 MG/2ML IJ SOLN
4.0000 mg | Freq: Four times a day (QID) | INTRAMUSCULAR | Status: DC | PRN
  Administered 2013-06-11 – 2013-06-14 (×4): 4 mg via INTRAVENOUS
  Filled 2013-06-11 (×4): qty 2

## 2013-06-11 MED ORDER — GLYCOPYRROLATE 0.2 MG/ML IJ SOLN
INTRAMUSCULAR | Status: DC | PRN
  Administered 2013-06-11 (×2): 0.2 mg via INTRAVENOUS

## 2013-06-11 MED ORDER — FENTANYL CITRATE 0.05 MG/ML IJ SOLN
50.0000 ug | INTRAMUSCULAR | Status: DC | PRN
  Administered 2013-06-11 (×3): 50 ug via INTRAVENOUS
  Filled 2013-06-11 (×3): qty 2

## 2013-06-11 MED ORDER — PREDNISOLONE ACETATE 1 % OP SUSP
1.0000 [drp] | Freq: Two times a day (BID) | OPHTHALMIC | Status: DC
  Administered 2013-06-12: 1 [drp] via OPHTHALMIC
  Filled 2013-06-11: qty 1

## 2013-06-11 MED ORDER — FENTANYL CITRATE 0.05 MG/ML IJ SOLN
INTRAMUSCULAR | Status: AC
  Filled 2013-06-11: qty 5

## 2013-06-11 MED ORDER — MIDAZOLAM HCL 2 MG/2ML IJ SOLN
INTRAMUSCULAR | Status: AC
  Filled 2013-06-11: qty 2

## 2013-06-11 MED ORDER — ALBUTEROL SULFATE (2.5 MG/3ML) 0.083% IN NEBU: 2.5000 mg | INHALATION_SOLUTION | Freq: Four times a day (QID) | RESPIRATORY_TRACT | Status: DC | PRN

## 2013-06-11 MED ORDER — MIDAZOLAM HCL 5 MG/5ML IJ SOLN
INTRAMUSCULAR | Status: DC | PRN
  Administered 2013-06-11 (×2): 2 mg via INTRAVENOUS

## 2013-06-11 MED ORDER — GLYCOPYRROLATE 0.2 MG/ML IJ SOLN
INTRAMUSCULAR | Status: AC
  Filled 2013-06-11: qty 2

## 2013-06-11 MED ORDER — MONTELUKAST SODIUM 10 MG PO TABS: 10.0000 mg | ORAL_TABLET | Freq: Every evening | ORAL | Status: DC | PRN

## 2013-06-11 MED ORDER — GLYCOPYRROLATE 0.2 MG/ML IJ SOLN
INTRAMUSCULAR | Status: AC
  Filled 2013-06-11: qty 1

## 2013-06-11 MED ORDER — ALVIMOPAN 12 MG PO CAPS
12.0000 mg | ORAL_CAPSULE | Freq: Two times a day (BID) | ORAL | Status: DC
  Administered 2013-06-12 – 2013-06-14 (×5): 12 mg via ORAL
  Filled 2013-06-11 (×5): qty 1

## 2013-06-11 MED ORDER — ALUM & MAG HYDROXIDE-SIMETH 200-200-20 MG/5ML PO SUSP
30.0000 mL | ORAL | Status: DC | PRN
  Administered 2013-06-13: 30 mL via ORAL
  Filled 2013-06-11: qty 30

## 2013-06-11 MED ORDER — DILTIAZEM HCL ER COATED BEADS 120 MG PO CP24
120.0000 mg | ORAL_CAPSULE | Freq: Every day | ORAL | Status: DC
  Administered 2013-06-12 – 2013-06-14 (×3): 120 mg via ORAL
  Filled 2013-06-11 (×4): qty 1

## 2013-06-11 MED ORDER — LIDOCAINE HCL 1 % IJ SOLN
INTRAMUSCULAR | Status: DC | PRN
  Administered 2013-06-11: 50 mg via INTRADERMAL

## 2013-06-11 MED ORDER — SODIUM CHLORIDE 0.9 % IV SOLN
1.0000 g | INTRAVENOUS | Status: AC
  Administered 2013-06-11: 1 g via INTRAVENOUS
  Filled 2013-06-11: qty 1

## 2013-06-11 MED ORDER — ALVIMOPAN 12 MG PO CAPS
ORAL_CAPSULE | ORAL | Status: AC
  Filled 2013-06-11: qty 1

## 2013-06-11 MED ORDER — ACETAMINOPHEN 325 MG PO TABS: 650.0000 mg | ORAL_TABLET | Freq: Four times a day (QID) | ORAL | Status: DC | PRN

## 2013-06-11 MED ORDER — ROCURONIUM BROMIDE 50 MG/5ML IV SOLN
INTRAVENOUS | Status: AC
  Filled 2013-06-11: qty 1

## 2013-06-11 MED ORDER — ONDANSETRON HCL 4 MG/2ML IJ SOLN: 4.0000 mg | Freq: Once | INTRAMUSCULAR | Status: DC | PRN

## 2013-06-11 MED ORDER — SODIUM CHLORIDE 0.9 % IV SOLN
INTRAVENOUS | Status: DC | PRN
  Administered 2013-06-11: 07:00:00 via INTRAVENOUS

## 2013-06-11 MED ORDER — BUPIVACAINE HCL (PF) 0.5 % IJ SOLN
INTRAMUSCULAR | Status: DC | PRN
  Administered 2013-06-11: 8 mL

## 2013-06-11 MED ORDER — FENTANYL CITRATE 0.05 MG/ML IJ SOLN
25.0000 ug | INTRAMUSCULAR | Status: DC | PRN
  Administered 2013-06-11: 50 ug via INTRAVENOUS
  Filled 2013-06-11: qty 2

## 2013-06-11 MED ORDER — LACTATED RINGERS IV SOLN
INTRAVENOUS | Status: DC
  Administered 2013-06-11 – 2013-06-13 (×5): via INTRAVENOUS

## 2013-06-11 MED ORDER — ONDANSETRON HCL 4 MG/2ML IJ SOLN
4.0000 mg | Freq: Once | INTRAMUSCULAR | Status: AC
  Administered 2013-06-11: 4 mg via INTRAVENOUS

## 2013-06-11 MED ORDER — GLYCOPYRROLATE 0.2 MG/ML IJ SOLN
0.2000 mg | Freq: Once | INTRAMUSCULAR | Status: AC
  Administered 2013-06-11: 0.2 mg via INTRAVENOUS

## 2013-06-11 MED ORDER — SODIUM CHLORIDE 0.9 % IR SOLN
Status: DC | PRN
  Administered 2013-06-11: 2000 mL

## 2013-06-11 MED ORDER — LIDOCAINE HCL (PF) 1 % IJ SOLN
INTRAMUSCULAR | Status: AC
  Filled 2013-06-11: qty 5

## 2013-06-11 MED ORDER — PROPOFOL 10 MG/ML IV BOLUS
INTRAVENOUS | Status: AC
  Filled 2013-06-11: qty 20

## 2013-06-11 MED ORDER — POVIDONE-IODINE 10 % EX OINT
TOPICAL_OINTMENT | CUTANEOUS | Status: AC
  Filled 2013-06-11: qty 2

## 2013-06-11 MED ORDER — ONDANSETRON HCL 4 MG/2ML IJ SOLN
INTRAMUSCULAR | Status: AC
  Filled 2013-06-11: qty 2

## 2013-06-11 MED ORDER — HYDROMORPHONE HCL PF 1 MG/ML IJ SOLN
1.0000 mg | INTRAMUSCULAR | Status: DC | PRN
  Administered 2013-06-11 – 2013-06-14 (×15): 1 mg via INTRAVENOUS
  Filled 2013-06-11 (×15): qty 1

## 2013-06-11 MED ORDER — BUPIVACAINE HCL (PF) 0.5 % IJ SOLN
INTRAMUSCULAR | Status: AC
  Filled 2013-06-11: qty 30

## 2013-06-11 MED ORDER — KETOROLAC TROMETHAMINE 30 MG/ML IJ SOLN
30.0000 mg | Freq: Once | INTRAMUSCULAR | Status: AC
  Administered 2013-06-11: 30 mg via INTRAVENOUS
  Filled 2013-06-11: qty 1

## 2013-06-11 MED ORDER — LACTATED RINGERS IV SOLN: INTRAVENOUS | Status: DC

## 2013-06-11 MED ORDER — OXYCODONE-ACETAMINOPHEN 5-325 MG PO TABS
1.0000 | ORAL_TABLET | ORAL | Status: DC | PRN
  Administered 2013-06-11: 1 via ORAL
  Filled 2013-06-11: qty 1

## 2013-06-11 MED ORDER — NITROGLYCERIN 0.4 MG SL SUBL: 0.4000 mg | SUBLINGUAL_TABLET | SUBLINGUAL | Status: DC | PRN

## 2013-06-11 MED ORDER — PANTOPRAZOLE SODIUM 40 MG PO TBEC
40.0000 mg | DELAYED_RELEASE_TABLET | Freq: Every day | ORAL | Status: DC
  Administered 2013-06-11 – 2013-06-13 (×3): 40 mg via ORAL
  Filled 2013-06-11 (×3): qty 1

## 2013-06-11 MED ORDER — ENOXAPARIN SODIUM 40 MG/0.4ML ~~LOC~~ SOLN
40.0000 mg | SUBCUTANEOUS | Status: DC
  Administered 2013-06-12 – 2013-06-14 (×3): 40 mg via SUBCUTANEOUS
  Filled 2013-06-11 (×3): qty 0.4

## 2013-06-11 MED ORDER — NEOSTIGMINE METHYLSULFATE 1 MG/ML IJ SOLN
INTRAMUSCULAR | Status: DC | PRN
  Administered 2013-06-11: 1 mg via INTRAVENOUS
  Administered 2013-06-11: 2 mg via INTRAVENOUS

## 2013-06-11 MED ORDER — KETOROLAC TROMETHAMINE 0.5 % OP SOLN
1.0000 [drp] | Freq: Four times a day (QID) | OPHTHALMIC | Status: DC
  Administered 2013-06-11: 1 [drp] via OPHTHALMIC
  Filled 2013-06-11: qty 3

## 2013-06-11 MED ORDER — LACTATED RINGERS IV SOLN
INTRAVENOUS | Status: DC
  Administered 2013-06-11 (×2): via INTRAVENOUS

## 2013-06-11 MED ORDER — NEOSTIGMINE METHYLSULFATE 1 MG/ML IJ SOLN
INTRAMUSCULAR | Status: AC
  Filled 2013-06-11: qty 1

## 2013-06-11 MED ORDER — FENTANYL CITRATE 0.05 MG/ML IJ SOLN
INTRAMUSCULAR | Status: DC | PRN
  Administered 2013-06-11: 100 ug via INTRAVENOUS
  Administered 2013-06-11 (×4): 50 ug via INTRAVENOUS
  Administered 2013-06-11: 100 ug via INTRAVENOUS
  Administered 2013-06-11 (×2): 50 ug via INTRAVENOUS

## 2013-06-11 SURGICAL SUPPLY — 74 items
BAG HAMPER (MISCELLANEOUS) ×2 IMPLANT
BLADE SURG SZ10 CARB STEEL (BLADE) ×2 IMPLANT
CHLORAPREP W/TINT 26ML (MISCELLANEOUS) ×2 IMPLANT
CLOTH BEACON ORANGE TIMEOUT ST (SAFETY) ×2 IMPLANT
COVER LIGHT HANDLE STERIS (MISCELLANEOUS) ×4 IMPLANT
COVER MAYO STAND XLG (DRAPE) ×2 IMPLANT
CUTTER ENDO LINEAR 45M (STAPLE) IMPLANT
DECANTER SPIKE VIAL GLASS SM (MISCELLANEOUS) ×2 IMPLANT
DRAPE INCISE IOBAN 44X35 STRL (DRAPES) ×2 IMPLANT
DRAPE WARM FLUID 44X44 (DRAPE) ×2 IMPLANT
ELECT BLADE 6 FLAT ULTRCLN (ELECTRODE) IMPLANT
ELECT REM PT RETURN 9FT ADLT (ELECTROSURGICAL) ×2
ELECTRODE REM PT RTRN 9FT ADLT (ELECTROSURGICAL) ×1 IMPLANT
FILTER SMOKE EVAC LAPAROSHD (FILTER) ×2 IMPLANT
GLOVE BIO SURGEON STRL SZ7.5 (GLOVE) ×2 IMPLANT
GLOVE BIOGEL PI IND STRL 7.5 (GLOVE) ×1 IMPLANT
GLOVE BIOGEL PI INDICATOR 7.5 (GLOVE) ×1
GLOVE ECLIPSE 6.5 STRL STRAW (GLOVE) ×4 IMPLANT
GLOVE ECLIPSE 7.0 STRL STRAW (GLOVE) ×6 IMPLANT
GLOVE INDICATOR 7.0 STRL GRN (GLOVE) ×4 IMPLANT
GOWN STRL REUS W/TWL LRG LVL3 (GOWN DISPOSABLE) ×12 IMPLANT
INST SET LAPROSCOPIC AP (KITS) ×2 IMPLANT
INST SET MAJOR GENERAL (KITS) ×2 IMPLANT
IV NS IRRIG 3000ML ARTHROMATIC (IV SOLUTION) IMPLANT
KIT ROOM TURNOVER AP CYSTO (KITS) IMPLANT
LIGASURE 5MM LAPAROSCOPIC (INSTRUMENTS) IMPLANT
LIGASURE IMPACT 36 18CM CVD LR (INSTRUMENTS) ×2 IMPLANT
LIGASURE LAP ATLAS 10MM 37CM (INSTRUMENTS) IMPLANT
MANIFOLD NEPTUNE II (INSTRUMENTS) ×2 IMPLANT
NEEDLE HYPO 18GX1.5 BLUNT FILL (NEEDLE) IMPLANT
NEEDLE HYPO 25X1 1.5 SAFETY (NEEDLE) ×2 IMPLANT
NS IRRIG 1000ML POUR BTL (IV SOLUTION) ×2 IMPLANT
PACK BASIC III (CUSTOM PROCEDURE TRAY) ×2
PACK LAP CHOLE LZT030E (CUSTOM PROCEDURE TRAY) ×2 IMPLANT
PACK SRG BSC III STRL LF ECLPS (CUSTOM PROCEDURE TRAY) ×1 IMPLANT
PAD ARMBOARD 7.5X6 YLW CONV (MISCELLANEOUS) ×2 IMPLANT
PENCIL HANDSWITCHING (ELECTRODE) ×2 IMPLANT
RELOAD LINEAR CUT PROX 55 BLUE (ENDOMECHANICALS) IMPLANT
RELOAD PROXIMATE 75MM BLUE (ENDOMECHANICALS) ×4 IMPLANT
RELOAD STAPLE TA45 3.5 REG BLU (ENDOMECHANICALS) IMPLANT
SEALER TISSUE G2 CVD JAW 35 (ENDOMECHANICALS) ×1 IMPLANT
SEALER TISSUE G2 CVD JAW 45CM (ENDOMECHANICALS) ×1
SET BASIN LINEN APH (SET/KITS/TRAYS/PACK) ×2 IMPLANT
SET TUBE IRRIG SUCTION NO TIP (IRRIGATION / IRRIGATOR) IMPLANT
SHEET LAVH (DRAPES) IMPLANT
SPONGE GAUZE 2X2 8PLY STRL LF (GAUZE/BANDAGES/DRESSINGS) ×4 IMPLANT
SPONGE GAUZE 4X4 12PLY (GAUZE/BANDAGES/DRESSINGS) ×2 IMPLANT
SPONGE LAP 18X18 X RAY DECT (DISPOSABLE) ×4 IMPLANT
STAPLER GUN LINEAR PROX 60 (STAPLE) ×2 IMPLANT
STAPLER PROXIMATE 55 BLUE (STAPLE) IMPLANT
STAPLER PROXIMATE 75MM BLUE (STAPLE) ×2 IMPLANT
STAPLER VISISTAT (STAPLE) ×2 IMPLANT
SUCTION POOLE TIP (SUCTIONS) ×2 IMPLANT
SUT CHROMIC 0 CT 1 (SUTURE) IMPLANT
SUT CHROMIC 2 0 SH (SUTURE) IMPLANT
SUT CHROMIC 3 0 SH 27 (SUTURE) IMPLANT
SUT PDS AB CT VIOLET #0 27IN (SUTURE) ×4 IMPLANT
SUT SILK 2 0 (SUTURE)
SUT SILK 2-0 18XBRD TIE 12 (SUTURE) IMPLANT
SUT SILK 3 0 SH CR/8 (SUTURE) ×4 IMPLANT
SUT VIC AB 0 CT1 27 (SUTURE)
SUT VIC AB 0 CT1 27XCR 8 STRN (SUTURE) IMPLANT
SUT VIC AB 2-0 CT2 27 (SUTURE) IMPLANT
SYR CONTROL 10ML LL (SYRINGE) ×2 IMPLANT
SYS LAPSCP GELPORT 120MM (MISCELLANEOUS) ×2
SYSTEM LAPSCP GELPORT 120MM (MISCELLANEOUS) ×1 IMPLANT
TAPE CLOTH SURG 4X10 WHT LF (GAUZE/BANDAGES/DRESSINGS) ×2 IMPLANT
TRAY FOLEY CATH 16FR SILVER (SET/KITS/TRAYS/PACK) ×2 IMPLANT
TROCAR ENDO BLADELESS 11MM (ENDOMECHANICALS) ×2 IMPLANT
TROCAR Z-THAD FIOS HNDL 12X100 (TROCAR) ×2 IMPLANT
TUBING INSUFFLATION (TUBING) ×2 IMPLANT
WARMER LAPAROSCOPE (MISCELLANEOUS) ×2 IMPLANT
YANKAUER SUCT BULB TIP 10FT TU (MISCELLANEOUS) ×2 IMPLANT
YANKAUER SUCT BULB TIP NO VENT (SUCTIONS) ×2 IMPLANT

## 2013-06-11 NOTE — Progress Notes (Signed)
When I resumed pts care at 1530 pts family stated that pain medication had not been effective. I notified pt that we would try fentanyl at due time coming up, and if that was ineffective I would page her physician. Pt received 42mcg of fentanyl at 1604, at 1705 pt was asleep, when pt woke up at approximately 1730 pt c/o pain stating that the fentanyl was not effective. Dr. Arnoldo Morale was made aware. Fentanyl was d/c and pt was started on Dilaudid 1mg  q2hr prn pain. Marry Guan

## 2013-06-11 NOTE — Progress Notes (Signed)
Dr Arnoldo Morale aware of warning on invanz antibiotic. Michela Pitcher ok to still give patient antibiotic.

## 2013-06-11 NOTE — Anesthesia Procedure Notes (Signed)
Procedure Name: Intubation Date/Time: 06/11/2013 7:44 AM Performed by: Charmaine Downs Pre-anesthesia Checklist: Suction available, Patient being monitored, Emergency Drugs available and Patient identified Patient Re-evaluated:Patient Re-evaluated prior to inductionOxygen Delivery Method: Circle system utilized Preoxygenation: Pre-oxygenation with 100% oxygen Intubation Type: IV induction Ventilation: Mask ventilation without difficulty and Oral airway inserted - appropriate to patient size Laryngoscope Size: Mac and 4 Grade View: Grade I Tube type: Oral Tube size: 8.0 mm Number of attempts: 1 Airway Equipment and Method: Stylet and Oral airway Placement Confirmation: positive ETCO2 and breath sounds checked- equal and bilateral Secured at: 22 cm Tube secured with: Tape Dental Injury: Teeth and Oropharynx as per pre-operative assessment

## 2013-06-11 NOTE — Progress Notes (Signed)
Utilization Review Complete  

## 2013-06-11 NOTE — Interval H&P Note (Signed)
History and Physical Interval Note:  06/11/2013 7:23 AM  [name] [name]  has presented today for surgery, with the diagnosis of Neoplasm of Colon  The various methods of treatment have been discussed with the patient and family. After consideration of risks, benefits and other options for treatment, the patient has consented to  Procedure(s): LAPAROSCOPIC HAND ASSISTED PARTIAL COLECTOMY (N/A) as a surgical intervention .  The patient's history has been reviewed, patient examined, no change in status, stable for surgery.  I have reviewed the patient's chart and labs.  Questions were answered to the patient's satisfaction.     Aviva Signs A

## 2013-06-11 NOTE — Anesthesia Preprocedure Evaluation (Signed)
Anesthesia Evaluation  Patient identified by MRN, date of birth, ID band Patient awake    Reviewed: Allergy & Precautions, H&P , NPO status , Patient's Chart, lab work & pertinent test results  Airway Mallampati: II      Dental  (+) Teeth Intact   Pulmonary COPDformer smoker,  breath sounds clear to auscultation        Cardiovascular hypertension, Pt. on medications - angina+ CAD, + Past MI and + Peripheral Vascular Disease + dysrhythmias Rhythm:Regular Rate:Normal     Neuro/Psych    GI/Hepatic GERD-  Controlled and Medicated,  Endo/Other    Renal/GU      Musculoskeletal   Abdominal   Peds  Hematology   Anesthesia Other Findings   Reproductive/Obstetrics                           Anesthesia Physical Anesthesia Plan  ASA: III  Anesthesia Plan: General   Post-op Pain Management:    Induction: Intravenous  Airway Management Planned: Oral ETT  Additional Equipment:   Intra-op Plan:   Post-operative Plan: Extubation in OR  Informed Consent: I have reviewed the patients History and Physical, chart, labs and discussed the procedure including the risks, benefits and alternatives for the proposed anesthesia with the patient or authorized representative who has indicated his/her understanding and acceptance.     Plan Discussed with:   Anesthesia Plan Comments:         Anesthesia Quick Evaluation

## 2013-06-11 NOTE — Op Note (Signed)
Patient:  CLOY COZZENS  DOB:  [birth year]  MRN:  [id number]   Preop Diagnosis:  Colon neoplasm  Postop Diagnosis:  Same  Procedure:  Laparoscopic hand-assisted partial colectomy  Surgeon:  Aviva Signs, M.D.  Anes:  General endotracheal  Indications:  Patient is a 73 year old white male who was found on colonoscopy to have a large colon polyp at the hepatic flexure which could not be removed endoscopically. Initial biopsies were negative for malignancy. The patient now comes to the operating room for a laparoscopic hand-assisted partial colectomy. The risks and benefits of the procedure including bleeding, infection, cardiopulmonary difficulties, and the possibility of an open procedure were fully explained to the patient, who gave informed consent.  Procedure note:  The patient is placed the supine position. After induction of general endotracheal anesthesia, the abdomen was prepped and draped using usual sterile technique with DuraPrep. Surgical site confirmation was performed.  An incision was made at the umbilical level. The dissection was taken down to the peritoneal cavity. The peritoneal cavity was entered into without difficulty. A GelPort was then inserted. An additional 12 mm trocar was placed left upper quadrant region and a 11 mm trocar was placed in the right lower quadrant region. Both were done under direct palpation. The abdomen was then insufflated to 16 mm mercury pressure. The liver was inspected and noted within normal limits. The right colon was mobilized along the peritoneal reflection. The large polyp was identified by X. had 2 that had been previously placed. The hepatic flexure was taken down using the LigaSure. A GIA stapler was placed across the mid transverse colon and fired. This was likewise done across the terminal ileum. The right colon was then mobilized through the GelPort and the mesentery was divided using a LigaSure. The specimen was then inspected in the  operating room and the lesion of concern was within the specimen removed. Adequate margins were also noted. An ileocolic anastomosis was then performed side to side using a GIA 75 stapler. The enterotomy was closed using a TA 60 stapler. The staple line was bolstered using 3-0 silk sutures. The mesenteric defect was reapproximated using 3-0 silk interrupted sutures. The bowel was then returned into the abdominal cavity an orderly fashion. All operating personnel then changed the gown and gloves. A new set up was then used.  The abdominal cavity was copiously irrigated normal saline. No active bleeding was noted. The anastomosis was inspected and noted to be widely patent and viable. The fascia was reapproximated in the midline using 0 PDS running sutures. All wounds were irrigated normal saline and the skin was closed using staples. 0.5% Sensorcaine was instilled the surrounding wound. Betadine ointment and dry sterile dressings were applied.  All tape and needle counts were correct at the end of the procedure. Patient was extubated in the operating room and transferred to PACU in stable condition.  Complications:  None  EBL:  100 cc  Specimen:  Right colon

## 2013-06-11 NOTE — Transfer of Care (Signed)
Immediate Anesthesia Transfer of Care Note  Patient: [name] [name]  Procedure(s) Performed: Procedure(s): LAPAROSCOPIC HAND ASSISTED PARTIAL COLECTOMY (N/A)  Patient Location: PACU  Anesthesia Type:General  Level of Consciousness: awake and patient cooperative  Airway & Oxygen Therapy: Patient Spontanous Breathing and Patient connected to face mask oxygen  Post-op Assessment: Report given to PACU RN, Post -op Vital signs reviewed and stable and Patient moving all extremities  Post vital signs: Reviewed and stable  Complications: No apparent anesthesia complications

## 2013-06-11 NOTE — Anesthesia Postprocedure Evaluation (Signed)
  Anesthesia Post-op Note  Patient: [name] [name]  Procedure(s) Performed: Procedure(s): LAPAROSCOPIC HAND ASSISTED PARTIAL COLECTOMY (N/A)  Patient Location: PACU  Anesthesia Type:General  Level of Consciousness: awake, alert , oriented and patient cooperative  Airway and Oxygen Therapy: Patient Spontanous Breathing  Post-op Pain: 3 /10, mild  Post-op Assessment: Post-op Vital signs reviewed, Patient's Cardiovascular Status Stable, Respiratory Function Stable, Patent Airway, No signs of Nausea or vomiting and Pain level controlled  Post-op Vital Signs: Reviewed and stable  Complications: No apparent anesthesia complications

## 2013-06-12 ENCOUNTER — Encounter (HOSPITAL_COMMUNITY): Payer: Self-pay | Admitting: General Surgery

## 2013-06-12 LAB — BASIC METABOLIC PANEL
BUN: 13 mg/dL (ref 6–23)
CO2: 29 mEq/L (ref 19–32)
Calcium: 8.7 mg/dL (ref 8.4–10.5)
Chloride: 100 mEq/L (ref 96–112)
Creatinine, Ser: 0.99 mg/dL (ref 0.50–1.35)
GFR calc Af Amer: >90 mL/min (ref >90)
GFR calc non Af Amer: 80 mL/min — ABNORMAL LOW (ref >90)
Glucose, Bld: 91 mg/dL (ref 70–99)
Potassium: 4 mEq/L (ref 3.7–5.3)
Sodium: 139 mEq/L (ref 137–147)

## 2013-06-12 LAB — PHOSPHORUS: Phosphorus: 2.5 mg/dL (ref 2.3–4.6)

## 2013-06-12 LAB — CBC
HCT: 38.4 % — ABNORMAL LOW (ref 39.0–52.0)
Hemoglobin: 12.6 g/dL — ABNORMAL LOW (ref 13.0–17.0)
MCH: 29 pg (ref 26.0–34.0)
MCHC: 32.8 g/dL (ref 30.0–36.0)
MCV: 88.3 fL (ref 78.0–100.0)
Platelets: 218 10*3/uL (ref 150–400)
RBC: 4.35 MIL/uL (ref 4.22–5.81)
RDW: 14.2 % (ref 11.5–15.5)
WBC: 12.3 10*3/uL — ABNORMAL HIGH (ref 4.0–10.5)

## 2013-06-12 LAB — MAGNESIUM: Magnesium: 1.7 mg/dL (ref 1.5–2.5)

## 2013-06-12 MED ORDER — SALINE SPRAY 0.65 % NA SOLN
1.0000 | NASAL | Status: DC | PRN
  Administered 2013-06-12: 1 via NASAL
  Filled 2013-06-12 (×2): qty 44

## 2013-06-12 NOTE — Progress Notes (Signed)
1 Day Post-Op  Subjective: Mild incisional pain, controlled adequately with Dilaudid. No nausea.  Objective: Vital signs in last 24 hours: Temp:  [97.9 F (36.6 C)-100 F (37.8 C)] 99.3 F (37.4 C) (01/27 0430) Pulse Rate:  [83-95] 91 (01/27 0430) Resp:  [16-22] 20 (01/27 0430) BP: (114-142)/(66-79) 119/73 mmHg (01/27 0430) SpO2:  [91 %-100 %] 91 % (01/27 0430) Weight:  [90.719 kg (200 lb)] 90.719 kg (200 lb) (01/26 1201) Last BM Date: 06/11/13  Intake/Output from previous day: 01/26 0701 - 01/27 0700 In: 4185.4 [I.V.:4185.4] Out: 200 [Urine:100; Blood:100] Intake/Output this shift:    General appearance: alert, cooperative and no distress Resp: clear to auscultation bilaterally Cardio: regular rate and rhythm, S1, S2 normal, no murmur, click, rub or gallop GI: Soft. Dressings dry and intact.  Lab Results:   Recent Labs  06/12/13 0621  WBC 12.3*  HGB 12.6*  HCT 38.4*  PLT 218   BMET  Recent Labs  06/12/13 0621  NA 139  K 4.0  CL 100  CO2 29  GLUCOSE 91  BUN 13  CREATININE 0.99  CALCIUM 8.7   PT/INR No results found for this basename: LABPROT, INR,  in the last 72 hours  Studies/Results: No results found.  Anti-infectives: Anti-infectives   Start     Dose/Rate Route Frequency Ordered Stop   06/11/13 0618  ertapenem (INVANZ) 1 g in sodium chloride 0.9 % 50 mL IVPB     1 g 100 mL/hr over 30 Minutes Intravenous On call to O.R. 06/11/13 0618 06/11/13 0725      Assessment/Plan: s/p Procedure(s): LAPAROSCOPIC HAND ASSISTED PARTIAL COLECTOMY Impression: Stable on postoperative day one. Tolerating clear liquid diet well. Will advance diet as tolerated. Patient to ambulate. Adjust IV fluids. Awaiting return of bowel function.  LOS: 1 day    Jocee Kissick A 06/12/2013

## 2013-06-12 NOTE — Anesthesia Postprocedure Evaluation (Signed)
  Anesthesia Post-op Note  Patient: [name] [name]  Procedure(s) Performed: Procedure(s): LAPAROSCOPIC HAND ASSISTED PARTIAL COLECTOMY (N/A)  Patient Location: Room 337  Anesthesia Type:General  Level of Consciousness: awake, alert , oriented and patient cooperative  Airway and Oxygen Therapy: Patient Spontanous Breathing  Post-op Pain: mild  Post-op Assessment: Post-op Vital signs reviewed, Patient's Cardiovascular Status Stable, Respiratory Function Stable, Patent Airway, No signs of Nausea or vomiting and Pain level controlled  Post-op Vital Signs: Reviewed and stable  Complications: No apparent anesthesia complications

## 2013-06-12 NOTE — Progress Notes (Signed)
Foley Catheter removed. Pt sat on side of bed and stood at bedside with minimal assistance.

## 2013-06-13 LAB — BASIC METABOLIC PANEL
BUN: 9 mg/dL (ref 6–23)
CO2: 30 mEq/L (ref 19–32)
Calcium: 9.3 mg/dL (ref 8.4–10.5)
Chloride: 95 mEq/L — ABNORMAL LOW (ref 96–112)
Creatinine, Ser: 0.85 mg/dL (ref 0.50–1.35)
GFR calc Af Amer: >90 mL/min (ref >90)
GFR calc non Af Amer: 86 mL/min — ABNORMAL LOW (ref >90)
Glucose, Bld: 97 mg/dL (ref 70–99)
Potassium: 3.6 mEq/L — ABNORMAL LOW (ref 3.7–5.3)
Sodium: 136 mEq/L — ABNORMAL LOW (ref 137–147)

## 2013-06-13 LAB — CBC
HCT: 40.7 % (ref 39.0–52.0)
Hemoglobin: 13.4 g/dL (ref 13.0–17.0)
MCH: 28.8 pg (ref 26.0–34.0)
MCHC: 32.9 g/dL (ref 30.0–36.0)
MCV: 87.5 fL (ref 78.0–100.0)
Platelets: 240 10*3/uL (ref 150–400)
RBC: 4.65 MIL/uL (ref 4.22–5.81)
RDW: 14 % (ref 11.5–15.5)
WBC: 12.8 10*3/uL — ABNORMAL HIGH (ref 4.0–10.5)

## 2013-06-13 LAB — MAGNESIUM: Magnesium: 1.7 mg/dL (ref 1.5–2.5)

## 2013-06-13 LAB — PHOSPHORUS: Phosphorus: 1.8 mg/dL — ABNORMAL LOW (ref 2.3–4.6)

## 2013-06-13 MED ORDER — MAGNESIUM HYDROXIDE 400 MG/5ML PO SUSP
30.0000 mL | Freq: Two times a day (BID) | ORAL | Status: DC
  Administered 2013-06-13 – 2013-06-14 (×2): 30 mL via ORAL
  Filled 2013-06-13 (×2): qty 30

## 2013-06-13 MED ORDER — PANTOPRAZOLE SODIUM 40 MG PO TBEC
40.0000 mg | DELAYED_RELEASE_TABLET | Freq: Every day | ORAL | Status: DC
  Administered 2013-06-14: 40 mg via ORAL
  Filled 2013-06-13: qty 1

## 2013-06-13 MED ORDER — KCL IN DEXTROSE-NACL 20-5-0.45 MEQ/L-%-% IV SOLN
INTRAVENOUS | Status: DC
  Administered 2013-06-13: 14:00:00 via INTRAVENOUS

## 2013-06-13 NOTE — Progress Notes (Signed)
Pt's O2 sat 85% on RA. O2 at 2L [place] started and O2 sat up to 90%. O2 increased to 3L and O2 sat up to 92%.

## 2013-06-13 NOTE — Progress Notes (Signed)
2 Days Post-Op  Subjective: Mild nausea this morning which has since passed. Has not had flatus or bowel movement yet. No significant incisional pain.  Objective: Vital signs in last 24 hours: Temp:  [97.9 F (36.6 C)-99.4 F (37.4 C)] 98.2 F (36.8 C) (01/28 1151) Pulse Rate:  [75-105] 75 (01/28 1151) Resp:  [18-20] 20 (01/28 1151) BP: (129-148)/(78-105) 129/84 mmHg (01/28 1151) SpO2:  [85 %-97 %] 96 % (01/28 1151) Last BM Date: 06/11/13  Intake/Output from previous day: 01/27 0701 - 01/28 0700 In: 2892.5 [P.O.:840; I.V.:2052.5] Out: -  Intake/Output this shift: Total I/O In: 240 [P.O.:240] Out: -   General appearance: alert, cooperative and no distress Resp: clear to auscultation bilaterally Cardio: regular rate and rhythm, S1, S2 normal, no murmur, click, rub or gallop GI: Soft. Dressings intact. Occasional bowel sounds appreciated.  Lab Results:   Recent Labs  06/12/13 0621 06/13/13 0604  WBC 12.3* 12.8*  HGB 12.6* 13.4  HCT 38.4* 40.7  PLT 218 240   BMET  Recent Labs  06/12/13 0621 06/13/13 0604  NA 139 136*  K 4.0 3.6*  CL 100 95*  CO2 29 30  GLUCOSE 91 97  BUN 13 9  CREATININE 0.99 0.85  CALCIUM 8.7 9.3   PT/INR No results found for this basename: LABPROT, INR,  in the last 72 hours  Studies/Results: No results found.  Anti-infectives: Anti-infectives   Start     Dose/Rate Route Frequency Ordered Stop   06/11/13 0618  ertapenem [facility]) 1 g in sodium chloride 0.9 % 50 mL IVPB     1 g 100 mL/hr over 30 Minutes Intravenous On call to O.R. 06/11/13 0618 06/11/13 0725      Assessment/Plan: s/p Procedure(s): LAPAROSCOPIC HAND ASSISTED PARTIAL COLECTOMY Impression: Stable on postoperative day 2. Awaiting return of bowel function. Final pathology was negative for malignancy. Continue current therapy. Adjust IV fluids.  LOS: 2 days    Jeshurun Oaxaca A 06/13/2013

## 2013-06-14 MED ORDER — HYDROCODONE-ACETAMINOPHEN 5-325 MG PO TABS: 1.0000 | ORAL_TABLET | ORAL | Status: DC | PRN

## 2013-06-14 NOTE — Discharge Instructions (Signed)
Laparoscopic Colectomy, Care After Refer to this sheet in the next few weeks. These instructions provide you with information on caring for yourself after your procedure. Your health care provider may also give you more specific instructions. Your treatment has been planned according to current medical practices, but problems sometimes occur. Call your health care provider if you have any problems or questions after your procedure. WHAT TO EXPECT AFTER THE PROCEDURE After your procedure, it is typical to have the following:  Pain in your abdomen, especially at the incision sites. You will be given pain medicine to control the pain.  Tiredness. This is a normal part of the recovery process. Your energy level will return to normal over the next several weeks.  Constipation. You may be given stool softeners to prevent this. HOME CARE INSTRUCTIONS   Only take over-the-counter or prescription medicines as directed by your health care provider.  Ask your health care provider whether you may take a shower when you go home.  Remove or change any bandages (dressings) as directed.  You may resume a normal diet and activities as directed. Eat plenty of fruits and vegetables to help prevent constipation.  Drink enough fluids to keep your urine clear or pale yellow. This also helps prevent constipation.  Take rest breaks during the day as needed.  Avoid lifting anything heavier than 25 pounds (11.3 kg) or driving for 4 weeks or until your health care provider says it is okay.  Follow up with your health care provider as directed. Ask your health care provider when to make an appointment to get your stitches or staples removed. SEEK MEDICAL CARE IF:   You have increased bleeding from the incision areas.  You have redness, swelling, or increasing pain in the wounds.  You see pus coming from a wound.  You have a fever.  You notice a foul smell coming from the wound or dressing.  Your wound is  breaking open (edges not staying together) after sutures or staples have been removed. SEEK IMMEDIATE MEDICAL CARE IF:  You develop a rash.  You have chest pain or difficulty breathing.  You have pain or swelling in your legs.  You have lightheadedness or feel faint.  Your abdomen becomes larger (distended).  You have nausea or vomiting.  You have blood in your stools. Document Released: 11/20/2004 Document Revised: 02/21/2013 Document Reviewed: 12/13/2012 Exit[employer] Patient Information 2014 [employer].  

## 2013-06-14 NOTE — Discharge Summary (Signed)
Physician Discharge Summary  Patient ID: [name] [name] MRN: [id number] DOB/AGE: 1940/11/05 73 y.o.  Admit date: 06/11/2013 Discharge date: 06/14/2013  Admission Diagnoses: Colonic neoplasm  Discharge Diagnoses: Same, benign tubulovillous adenoma Active Problems:   Colon neoplasm   Discharged Condition: good  Hospital Course: Patient is a 73 year old white male who was found on colonoscopy to have a large polyp at the hepatic flexure. He could not be removed endoscopically. He presented to the operating room on 06/11/2013 and underwent a laparoscopic hand-assisted right hemicolectomy. He tolerated the procedure well his postoperative course has been unremarkable. His diet was advanced without difficulty. Final pathology was negative for malignancy. Patient is being discharged home in good improving condition.  Treatments: surgery: Laparoscopic hand-assisted right hemicolectomy on 06/11/2013  Discharge Exam: Blood pressure 120/68, pulse 85, temperature 97.8 F (36.6 C), temperature source Oral, resp. rate 18, height 6\' 1"  (1.854 m), weight 90.719 kg (200 lb), SpO2 93.00%. General appearance: alert, cooperative and no distress Resp: clear to auscultation bilaterally Cardio: regular rate and rhythm, S1, S2 normal, no murmur, click, rub or gallop GI: Soft. Active bowel sounds appreciated. Incisions healing well.  Disposition: 01-Home or Self Care     Medication List         albuterol 108 (90 BASE) MCG/ACT inhaler  Commonly known as:  PROVENTIL HFA;VENTOLIN HFA  Inhale 2 puffs into the lungs every 6 (six) hours as needed for wheezing.     albuterol (2.5 MG/3ML) 0.083% nebulizer solution  Commonly known as:  PROVENTIL  Take 2.5 mg by nebulization every 6 (six) hours as needed for wheezing.     aspirin EC 81 MG tablet  Take 81 mg by mouth daily.     cetirizine 10 MG chewable tablet  Commonly known as:  ZYRTEC  Chew 10 mg by mouth daily.     diltiazem 120 MG 24 hr capsule   Commonly known as:  CARDIZEM CD  Take 1 capsule (120 mg total) by mouth daily.     esomeprazole 40 MG capsule  Commonly known as:  NEXIUM  Take 40 mg by mouth daily before breakfast.     HYDROcodone-acetaminophen 5-325 MG per tablet  Commonly known as:  NORCO/VICODIN  Take 1-2 tablets by mouth every 4 (four) hours as needed for moderate pain.     ketorolac 0.5 % ophthalmic solution  Commonly known as:  ACULAR  Place 1 drop into the left eye 2 (two) times daily.     lovastatin 40 MG tablet  Commonly known as:  MEVACOR  TAKE 2 TABLETS BY MOUTH EVERY DAY     montelukast 10 MG tablet  Commonly known as:  SINGULAIR  Take 1 tablet (10 mg total) by mouth at bedtime as needed. Allergies.     nitroGLYCERIN 0.4 MG SL tablet  Commonly known as:  NITROSTAT  Place 1 tablet (0.4 mg total) under the tongue every 5 (five) minutes as needed.     prednisoLONE acetate 1 % ophthalmic suspension  Commonly known as:  PRED FORTE  Place 1 drop into the left eye 2 (two) times daily.           Follow-up Information   Follow up with Jamesetta So, MD. Schedule an appointment as soon as possible for a visit on 06/21/2013.   Specialty:  General Surgery   Contact information:   [address] [address] [phone]       Signed: Aviva Signs A 06/14/2013, 8:41 AM

## 2013-06-14 NOTE — Progress Notes (Signed)
Pt a/o.vss. Up ad lib. Incision to abdomen clean dry and intact. Saline lock removed. Discharge instructions given. Prescriptions given. Pt verbalized understanding of instructions. Pt left floor via wheelchair with nursing staff and family.

## 2013-08-06 DIAGNOSIS — M48062 Spinal stenosis, lumbar region with neurogenic claudication: Secondary | ICD-10-CM | POA: Diagnosis not present

## 2013-08-10 ENCOUNTER — Telehealth: Payer: Self-pay | Admitting: *Deleted

## 2013-08-10 ENCOUNTER — Encounter: Payer: Self-pay | Admitting: Family Medicine

## 2013-08-10 ENCOUNTER — Ambulatory Visit (INDEPENDENT_AMBULATORY_CARE_PROVIDER_SITE_OTHER): Payer: Medicare Other | Admitting: Family Medicine

## 2013-08-10 VITALS — BP 132/90 | Temp 98.3°F | Ht 73.0 in | Wt 206.0 lb

## 2013-08-10 DIAGNOSIS — I2510 Atherosclerotic heart disease of native coronary artery without angina pectoris: Secondary | ICD-10-CM

## 2013-08-10 DIAGNOSIS — N399 Disorder of urinary system, unspecified: Secondary | ICD-10-CM | POA: Diagnosis not present

## 2013-08-10 DIAGNOSIS — I714 Abdominal aortic aneurysm, without rupture, unspecified: Secondary | ICD-10-CM

## 2013-08-10 DIAGNOSIS — N410 Acute prostatitis: Secondary | ICD-10-CM | POA: Diagnosis not present

## 2013-08-10 DIAGNOSIS — R3989 Other symptoms and signs involving the genitourinary system: Secondary | ICD-10-CM

## 2013-08-10 LAB — POCT URINALYSIS DIPSTICK
Spec Grav, UA: 1.02
pH, UA: 6

## 2013-08-10 MED ORDER — SULFAMETHOXAZOLE-TMP DS 800-160 MG PO TABS: 1.0000 | ORAL_TABLET | Freq: Two times a day (BID) | ORAL | Status: DC

## 2013-08-10 NOTE — Progress Notes (Signed)
   Subjective:    Patient ID: [name] [name], male    DOB: 1941/02/25, 73 y.o.   MRN: [id number]  HPI Comments: Had 30% of his colon removed 2 months ago.   Pelvic Pain This is a new problem. The current episode started in the past 7 days. The problem occurs intermittently. Associated symptoms include abdominal pain and urinary symptoms. Exacerbated by: Laying on abdomen  He has tried nothing for the symptoms.   Pt noting idiscomfort and urge and hesitancy to go to the bathroom  Felt sore and tender in suprapubic region  Felt this for the past wk  Had partial colectomy and for a wk after that, and cough sometimes  Pain in that area with certain motion and cough Some lingering pain and soreness from the surg  Recently had colon surgery. Partial colectomy. Had done pretty well for the past couple months since then. Review of Systems  Gastrointestinal: Positive for abdominal pain.  Genitourinary: Positive for pelvic pain.   No back pain no vomiting no nausea no chills no high fevers. ROS otherwise negative.    Objective:   Physical Exam  Alert talkative no acute distress H&T normal. Lungs clear. Heart rare rhythm. No CVA tenderness. Abdomen soft excellent bowel sounds surgical scar healing well. Minimal suprapubic tenderness prostate gland Bobby soft tender.      Assessment & Plan:  Impression acute prostatitis unrelated to #2 #2 inflammatory pain post surgery for partial colectomy. Plan 25 minutes spent most in discussion. Bactrim DS twice a day 30 days. Unfortunately cannot handle either Cipro or Doxy. Warning signs discussed. WSL

## 2013-08-10 NOTE — Telephone Encounter (Signed)
SPOKE WITH  PT  NEEDS   [facility]  DUPLEX IN MARCH  AT  [facility]   MESSAGE TO SCHEDULERS TO SET  UP./

## 2013-08-20 DIAGNOSIS — M48062 Spinal stenosis, lumbar region with neurogenic claudication: Secondary | ICD-10-CM | POA: Diagnosis not present

## 2013-08-23 ENCOUNTER — Other Ambulatory Visit: Payer: Self-pay

## 2013-08-23 ENCOUNTER — Telehealth: Payer: Self-pay | Admitting: Cardiovascular Disease

## 2013-08-23 DIAGNOSIS — I714 Abdominal aortic aneurysm, without rupture, unspecified: Secondary | ICD-10-CM

## 2013-08-23 NOTE — Telephone Encounter (Signed)
Returned call to patient spoke to wife she stated they have not heard when abd u/s appointment is to check her husbands aneurysm.Stated he would like to have done at [facility].Schedulers will call back to schedule.

## 2013-08-23 NOTE — Telephone Encounter (Signed)
New message    Patient calling stating he has not heard anything from [facility] regarding his testing.

## 2013-08-24 NOTE — Telephone Encounter (Signed)
LMTCB ./CY 

## 2013-08-24 NOTE — Telephone Encounter (Signed)
PER PT    AAA  SCHEDULED .Adonis Housekeeper

## 2013-08-28 ENCOUNTER — Ambulatory Visit (HOSPITAL_COMMUNITY)
Admission: RE | Admit: 2013-08-28 | Discharge: 2013-08-28 | Disposition: A | Discharge status: Home / Self Care | Payer: Medicare Other | Source: Ambulatory Visit | Attending: Cardiovascular Disease | Admitting: Cardiovascular Disease

## 2013-08-28 DIAGNOSIS — Z136 Encounter for screening for cardiovascular disorders: Secondary | ICD-10-CM | POA: Diagnosis not present

## 2013-08-28 DIAGNOSIS — I714 Abdominal aortic aneurysm, without rupture, unspecified: Secondary | ICD-10-CM | POA: Diagnosis not present

## 2013-08-29 ENCOUNTER — Telehealth: Payer: Self-pay

## 2013-08-29 ENCOUNTER — Other Ambulatory Visit: Payer: Self-pay

## 2013-08-29 DIAGNOSIS — R030 Elevated blood-pressure reading, without diagnosis of hypertension: Secondary | ICD-10-CM

## 2013-08-29 MED ORDER — DILTIAZEM HCL ER COATED BEADS 120 MG PO CP24: 120.0000 mg | ORAL_CAPSULE | Freq: Every day | ORAL | Status: DC

## 2013-08-29 NOTE — Telephone Encounter (Signed)
error 

## 2013-09-13 ENCOUNTER — Other Ambulatory Visit: Payer: Self-pay | Admitting: Dermatology

## 2013-09-13 DIAGNOSIS — D044 Carcinoma in situ of skin of scalp and neck: Secondary | ICD-10-CM | POA: Diagnosis not present

## 2013-09-17 DIAGNOSIS — D044 Carcinoma in situ of skin of scalp and neck: Secondary | ICD-10-CM | POA: Diagnosis not present

## 2013-09-17 DIAGNOSIS — D0430 Carcinoma in situ of skin of unspecified part of face: Secondary | ICD-10-CM | POA: Diagnosis not present

## 2013-09-17 DIAGNOSIS — D0439 Carcinoma in situ of skin of other parts of face: Secondary | ICD-10-CM | POA: Diagnosis not present

## 2013-09-18 DIAGNOSIS — M48062 Spinal stenosis, lumbar region with neurogenic claudication: Secondary | ICD-10-CM | POA: Diagnosis not present

## 2013-09-18 DIAGNOSIS — IMO0002 Reserved for concepts with insufficient information to code with codable children: Secondary | ICD-10-CM | POA: Diagnosis not present

## 2013-09-18 DIAGNOSIS — M1990 Unspecified osteoarthritis, unspecified site: Secondary | ICD-10-CM | POA: Diagnosis not present

## 2013-09-30 DIAGNOSIS — H43399 Other vitreous opacities, unspecified eye: Secondary | ICD-10-CM | POA: Diagnosis not present

## 2013-10-01 DIAGNOSIS — H43819 Vitreous degeneration, unspecified eye: Secondary | ICD-10-CM | POA: Diagnosis not present

## 2013-10-01 DIAGNOSIS — H33019 Retinal detachment with single break, unspecified eye: Secondary | ICD-10-CM | POA: Diagnosis not present

## 2013-10-01 DIAGNOSIS — H3320 Serous retinal detachment, unspecified eye: Secondary | ICD-10-CM | POA: Diagnosis not present

## 2013-10-01 DIAGNOSIS — H33039 Retinal detachment with giant retinal tear, unspecified eye: Secondary | ICD-10-CM | POA: Diagnosis not present

## 2013-10-02 DIAGNOSIS — H33019 Retinal detachment with single break, unspecified eye: Secondary | ICD-10-CM | POA: Diagnosis not present

## 2013-10-04 ENCOUNTER — Ambulatory Visit (INDEPENDENT_AMBULATORY_CARE_PROVIDER_SITE_OTHER): Payer: Medicare Other | Admitting: Family Medicine

## 2013-10-04 ENCOUNTER — Encounter: Payer: Self-pay | Admitting: Family Medicine

## 2013-10-04 VITALS — BP 128/90 | Temp 98.3°F | Ht 73.0 in | Wt 207.0 lb

## 2013-10-04 DIAGNOSIS — J310 Chronic rhinitis: Secondary | ICD-10-CM

## 2013-10-04 DIAGNOSIS — I2510 Atherosclerotic heart disease of native coronary artery without angina pectoris: Secondary | ICD-10-CM

## 2013-10-04 DIAGNOSIS — J329 Chronic sinusitis, unspecified: Secondary | ICD-10-CM

## 2013-10-04 MED ORDER — SULFAMETHOXAZOLE-TMP DS 800-160 MG PO TABS: 1.0000 | ORAL_TABLET | Freq: Two times a day (BID) | ORAL | Status: DC

## 2013-10-04 NOTE — Progress Notes (Signed)
   Subjective:    Patient ID: [name] [name], male    DOB: [birth year], 73 y.o.   MRN: [id number]  HPI Comments: Just had detached retina surgery on Monday  Sinusitis This is a new problem. Episode onset: Last Wed. There has been no fever. Associated symptoms include congestion, coughing, ear pain and sinus pressure. Past treatments include oral decongestants and antibiotics. The treatment provided mild relief.    Started sulfa based abx on Tuesday  Irritated left eatr  Nasal cong and stuffy gunky nand bloody  No sig fever   Review of Systems  HENT: Positive for congestion, ear pain and sinus pressure.   Respiratory: Positive for cough.        Objective:   Physical Exam Alert moderate malaise. Frontal maxillary tenderness. Left TM retracted. Pharynx normal neck supple. Lungs clear heart regular in rhythm.       Assessment & Plan:  Impression acute rhinosinusitis status post recent retinal surgery discussed plan antibiotics prescribed. Symptomatic care discussed. WSL

## 2013-10-22 ENCOUNTER — Telehealth: Payer: Self-pay | Admitting: Family Medicine

## 2013-10-22 MED ORDER — CLINDAMYCIN HCL 300 MG PO CAPS: 300.0000 mg | ORAL_CAPSULE | Freq: Three times a day (TID) | ORAL | Status: DC

## 2013-10-22 NOTE — Telephone Encounter (Signed)
Patient unable to take Doxycycline due to allergy-consult with doctor-switch to Cleocin 300mg  TID for 7 days. Med sent electronically to pharmacy. Patient notified.

## 2013-10-22 NOTE — Telephone Encounter (Signed)
Doxycycline under milligrams 1 twice a day for 10 days. Take with a snack and called last water. In addition to this saline nasal spray on a regular basis. Vaseline in the nose at nighttime. If nosebleed recurs stop it by squeezing the nose for at least 5 minutes on both sides of the nostril. If frequent or ongoing nosebleeds he would need to be seen. Here or ER.

## 2013-10-22 NOTE — Telephone Encounter (Signed)
Pt calling to say he finished his Antibiotic from his appt on 5/21  Woke last night with a slight nose bleed and still feeling congested  Wants to know if you can call in another round and what would you  Recommend for the nose irritation/bleeding   Woodlyn apoth

## 2013-11-01 DIAGNOSIS — M898X9 Other specified disorders of bone, unspecified site: Secondary | ICD-10-CM | POA: Diagnosis not present

## 2013-11-12 DIAGNOSIS — M48062 Spinal stenosis, lumbar region with neurogenic claudication: Secondary | ICD-10-CM | POA: Diagnosis not present

## 2013-11-12 DIAGNOSIS — IMO0002 Reserved for concepts with insufficient information to code with codable children: Secondary | ICD-10-CM | POA: Diagnosis not present

## 2013-11-12 DIAGNOSIS — M1990 Unspecified osteoarthritis, unspecified site: Secondary | ICD-10-CM | POA: Diagnosis not present

## 2013-11-20 ENCOUNTER — Encounter: Payer: Self-pay | Admitting: Family Medicine

## 2013-11-20 ENCOUNTER — Ambulatory Visit (INDEPENDENT_AMBULATORY_CARE_PROVIDER_SITE_OTHER): Payer: Medicare Other | Admitting: Family Medicine

## 2013-11-20 ENCOUNTER — Telehealth: Payer: Self-pay | Admitting: Family Medicine

## 2013-11-20 VITALS — BP 114/70 | Temp 98.5°F | Ht 73.0 in | Wt 207.0 lb

## 2013-11-20 DIAGNOSIS — R030 Elevated blood-pressure reading, without diagnosis of hypertension: Secondary | ICD-10-CM

## 2013-11-20 DIAGNOSIS — I119 Hypertensive heart disease without heart failure: Secondary | ICD-10-CM | POA: Diagnosis not present

## 2013-11-20 DIAGNOSIS — Z79899 Other long term (current) drug therapy: Secondary | ICD-10-CM

## 2013-11-20 DIAGNOSIS — E782 Mixed hyperlipidemia: Secondary | ICD-10-CM

## 2013-11-20 DIAGNOSIS — I2510 Atherosclerotic heart disease of native coronary artery without angina pectoris: Secondary | ICD-10-CM

## 2013-11-20 MED ORDER — SUCRALFATE 1 G PO TABS: ORAL_TABLET | ORAL | Status: AC

## 2013-11-20 NOTE — Addendum Note (Signed)
Addended by: Dairl Ponder on: 11/20/2013 04:53 PM   Modules accepted: Orders

## 2013-11-20 NOTE — Telephone Encounter (Signed)
Patient was here this morning and forgot to ask about lab papers. He thinks he is overdue for complete workup.He wanted to know if it was time.

## 2013-11-20 NOTE — Telephone Encounter (Signed)
Complete w u due after nov 25 and wellness too then  Lip iv now. No need for ov for just this

## 2013-11-20 NOTE — Progress Notes (Signed)
   Subjective:    Patient ID: [name] [name], male    DOB: [birth year], 73 y.o.   MRN: [id number]  HPINausea, bloating, dull headache, dizziness, loose bowels, burning in stomach. Started 10 days ago. Tried nexium, mylanta, tums, and zantac.    first woke up with an epigastric burning  Felt bloated dull headache, diminished energy  Feels bloated, nauseated feelling  Bowels  Loose but not diarrhea.  Tried tums and mylanta and recently added zantac, still having difficulties  Feels tight, appetite not good energy not the best,  Pt has hx of ulcers, many years ago.  Hx of flare last gfall, and started carafate and it helped. gb out in '99, hx of stones   Also history of coronary artery disease. Clinically stable. No chest pain currently.  History COPD. Currently stable. No excessive shortness of breath.  History of hyperlipidemia. Current stat status uncertain. Compliant with medications.     Review of Systems No chest pain no back pain no rash no excess joint pain see present illness    Objective:   Physical Exam  Alert vital stable. HEENT normal. Lungs clear. Heart rare in rhythm. Abdomen no discrete tenderness. No rebound no guarding. Diffuse mild tenderness.      Assessment & Plan:  Impression flare of gastritis/reflux. Carafate helped in the past. #2 coronary artery disease clinically silent. #3 hyperlipidemia status uncertain. Plan diet exercise discussed. Appropriate medications. Further recommendations based on blood work. WSL

## 2013-11-20 NOTE — Telephone Encounter (Signed)
Blood work orders placed in Epic. Patient notified. 

## 2013-11-29 ENCOUNTER — Other Ambulatory Visit: Payer: Self-pay | Admitting: Family Medicine

## 2013-11-29 ENCOUNTER — Ambulatory Visit (INDEPENDENT_AMBULATORY_CARE_PROVIDER_SITE_OTHER): Payer: Medicare Other | Admitting: Family Medicine

## 2013-11-29 ENCOUNTER — Encounter: Payer: Self-pay | Admitting: Family Medicine

## 2013-11-29 VITALS — BP 130/78 | Ht 73.0 in | Wt 208.0 lb

## 2013-11-29 DIAGNOSIS — R109 Unspecified abdominal pain: Secondary | ICD-10-CM | POA: Diagnosis not present

## 2013-11-29 DIAGNOSIS — D509 Iron deficiency anemia, unspecified: Secondary | ICD-10-CM | POA: Diagnosis not present

## 2013-11-29 DIAGNOSIS — I2510 Atherosclerotic heart disease of native coronary artery without angina pectoris: Secondary | ICD-10-CM

## 2013-11-29 DIAGNOSIS — K219 Gastro-esophageal reflux disease without esophagitis: Secondary | ICD-10-CM | POA: Diagnosis not present

## 2013-11-29 NOTE — Progress Notes (Signed)
   Subjective:    Patient ID: [name] [name], male    DOB: 1941-01-27, 73 y.o.   MRN: [id number]  Abdominal Pain This is a chronic problem. The current episode started 1 to 4 weeks ago. Associated symptoms include headaches and nausea. Associated symptoms comments: Bloated, acid reflux. Treatments tried: carafate and nexium. Improvement on treatment: feeling some better but not completely gone.   Patient has had endoscopies before for gastritis and states that he has had some Barrett's changes as well but he denies any history of cancer or dysplasia last EGD was several years back Some loose stools but denies mucus denies blood denies fever.  Review of Systems  Gastrointestinal: Positive for nausea and abdominal pain.  Neurological: Positive for headaches.       Objective:   Physical Exam Lungs are clear hearts regular pulses normal abdomen subjective discomfort in the epigastric region as well as the midabdomen. Extremities no edema skin warm dry       Assessment & Plan:  Abdominal pain-we will go ahead and do some lab work to help rule out other pathology. This patient needs EGD. He already has an appointment with Dr. Paulita Fujita on July 30. I believe this would be fine. He is to continue taking PPI. May use Maalox when necessary.  He has recently been on antibiotics C. difficile stool testing is pending may need Flagyl. I would have a low threshold of using Flagyl even if stool PCR is negative if his diarrhea continues

## 2013-11-30 DIAGNOSIS — Z79899 Other long term (current) drug therapy: Secondary | ICD-10-CM | POA: Diagnosis not present

## 2013-11-30 DIAGNOSIS — E782 Mixed hyperlipidemia: Secondary | ICD-10-CM | POA: Diagnosis not present

## 2013-11-30 LAB — LIPID PANEL
Cholesterol: 129 mg/dL (ref 0–200)
HDL: 39 mg/dL — ABNORMAL LOW (ref >39)
LDL Cholesterol: 70 mg/dL (ref 0–99)
Total CHOL/HDL Ratio: 3.3 Ratio
Triglycerides: 100 mg/dL (ref <150)
VLDL: 20 mg/dL (ref 0–40)

## 2013-11-30 LAB — CBC WITH DIFFERENTIAL/PLATELET
Basophils Absolute: 0.1 10*3/uL (ref 0.0–0.1)
Basophils Relative: 1 % (ref 0–1)
Eosinophils Absolute: 0.3 10*3/uL (ref 0.0–0.7)
Eosinophils Relative: 4 % (ref 0–5)
HCT: 36.5 % — ABNORMAL LOW (ref 39.0–52.0)
Hemoglobin: 11.8 g/dL — ABNORMAL LOW (ref 13.0–17.0)
Lymphocytes Relative: 23 % (ref 12–46)
Lymphs Abs: 1.8 10*3/uL (ref 0.7–4.0)
MCH: 24.8 pg — ABNORMAL LOW (ref 26.0–34.0)
MCHC: 32.3 g/dL (ref 30.0–36.0)
MCV: 76.8 fL — ABNORMAL LOW (ref 78.0–100.0)
Monocytes Absolute: 0.6 10*3/uL (ref 0.1–1.0)
Monocytes Relative: 8 % (ref 3–12)
Neutro Abs: 5.1 10*3/uL (ref 1.7–7.7)
Neutrophils Relative %: 64 % (ref 43–77)
Platelets: 262 10*3/uL (ref 150–400)
RBC: 4.75 MIL/uL (ref 4.22–5.81)
RDW: 14.8 % (ref 11.5–15.5)
WBC: 8 10*3/uL (ref 4.0–10.5)

## 2013-11-30 LAB — LIPASE: Lipase: 46 U/L (ref 0–75)

## 2013-11-30 LAB — HEPATIC FUNCTION PANEL
ALT: 19 U/L (ref 0–53)
ALT: 20 U/L (ref 0–53)
AST: 17 U/L (ref 0–37)
AST: 18 U/L (ref 0–37)
Albumin: 3.9 g/dL (ref 3.5–5.2)
Albumin: 3.9 g/dL (ref 3.5–5.2)
Alkaline Phosphatase: 59 U/L (ref 39–117)
Alkaline Phosphatase: 59 U/L (ref 39–117)
Bilirubin, Direct: 0.1 mg/dL (ref 0.0–0.3)
Bilirubin, Direct: 0.1 mg/dL (ref 0.0–0.3)
Indirect Bilirubin: 0.5 mg/dL (ref 0.2–1.2)
Indirect Bilirubin: 0.5 mg/dL (ref 0.2–1.2)
Total Bilirubin: 0.6 mg/dL (ref 0.2–1.2)
Total Bilirubin: 0.6 mg/dL (ref 0.2–1.2)
Total Protein: 6.8 g/dL (ref 6.0–8.3)
Total Protein: 6.7 g/dL (ref 6.0–8.3)

## 2013-11-30 LAB — BASIC METABOLIC PANEL
BUN: 12 mg/dL (ref 6–23)
CO2: 28 mEq/L (ref 19–32)
Calcium: 8.9 mg/dL (ref 8.4–10.5)
Chloride: 103 mEq/L (ref 96–112)
Creat: 0.94 mg/dL (ref 0.50–1.35)
Glucose, Bld: 95 mg/dL (ref 70–99)
Potassium: 4.3 mEq/L (ref 3.5–5.3)
Sodium: 141 mEq/L (ref 135–145)

## 2013-12-01 LAB — CLOSTRIDIUM DIFFICILE BY PCR: Toxigenic C. Difficile by PCR: NOT DETECTED

## 2013-12-03 ENCOUNTER — Encounter: Payer: Self-pay | Admitting: Family Medicine

## 2013-12-04 ENCOUNTER — Telehealth: Payer: Self-pay | Admitting: Family Medicine

## 2013-12-04 LAB — STOOL CULTURE

## 2013-12-04 LAB — FECAL LACTOFERRIN, QUANT: Lactoferrin: NEGATIVE

## 2013-12-04 NOTE — Progress Notes (Signed)
Patient notified and verbalized understanding of the test results. No further questions. 

## 2013-12-04 NOTE — Telephone Encounter (Signed)
Patient would like a nurse to call him back regarding some of the results to his recent lab work.

## 2013-12-04 NOTE — Telephone Encounter (Signed)
Patient notified and verbalized understanding of the test results. No further questions. 

## 2013-12-05 DIAGNOSIS — D509 Iron deficiency anemia, unspecified: Secondary | ICD-10-CM | POA: Diagnosis not present

## 2013-12-05 LAB — IRON AND TIBC
%SAT: 9 % — ABNORMAL LOW (ref 20–55)
Iron: 33 ug/dL — ABNORMAL LOW (ref 42–165)
TIBC: 348 ug/dL (ref 215–435)
UIBC: 315 ug/dL (ref 125–400)

## 2013-12-05 LAB — FERRITIN: Ferritin: 4 ng/mL — ABNORMAL LOW (ref 22–322)

## 2013-12-05 MED ORDER — METRONIDAZOLE 500 MG PO TABS: 500.0000 mg | ORAL_TABLET | Freq: Three times a day (TID) | ORAL | Status: AC

## 2013-12-05 NOTE — Progress Notes (Signed)
Patient notified and verbalized understanding. 

## 2013-12-05 NOTE — Addendum Note (Signed)
Addended byCharolotte Capuchin D on: 12/05/2013 02:26 PM   Modules accepted: Orders

## 2013-12-05 NOTE — Addendum Note (Signed)
Addended byCharolotte Capuchin D on: 12/05/2013 09:42 AM   Modules accepted: Orders

## 2013-12-05 NOTE — Addendum Note (Signed)
Addended byCharolotte Capuchin D on: 12/05/2013 09:43 AM   Modules accepted: Orders

## 2013-12-10 ENCOUNTER — Telehealth: Payer: Self-pay | Admitting: *Deleted

## 2013-12-10 LAB — POC HEMOCCULT BLD/STL (HOME/3-CARD/SCREEN)
Card #2 Fecal Occult Blod, POC: POSITIVE
Card #3 Fecal Occult Blood, POC: POSITIVE
Fecal Occult Blood, POC: POSITIVE

## 2013-12-10 NOTE — Telephone Encounter (Signed)
Hemoccult cards positive x 3. Chart in message stack.

## 2013-12-10 NOTE — Telephone Encounter (Signed)
Notify pt stool tests revealed hidden blood in all samples, start iron gluconate one tab bid if not on iron supp, and one multivit daily, re ck here in off in one mo. Notify pt's gi doc of pos heme cult tests

## 2013-12-10 NOTE — Telephone Encounter (Signed)
Patient notified and verbalized understanding of the test results. I told Dr. Erlinda Hong office and Izora Ribas is faxing over results and demographics.

## 2013-12-13 ENCOUNTER — Ambulatory Visit
Admission: RE | Admit: 2013-12-13 | Discharge: 2013-12-13 | Disposition: A | Discharge status: Home / Self Care | Payer: Medicare Other | Source: Ambulatory Visit | Attending: Gastroenterology | Admitting: Gastroenterology

## 2013-12-13 ENCOUNTER — Other Ambulatory Visit: Payer: Self-pay | Admitting: Gastroenterology

## 2013-12-13 DIAGNOSIS — R142 Eructation: Secondary | ICD-10-CM | POA: Diagnosis not present

## 2013-12-13 DIAGNOSIS — R109 Unspecified abdominal pain: Secondary | ICD-10-CM

## 2013-12-13 DIAGNOSIS — D509 Iron deficiency anemia, unspecified: Secondary | ICD-10-CM | POA: Diagnosis not present

## 2013-12-13 DIAGNOSIS — R195 Other fecal abnormalities: Secondary | ICD-10-CM | POA: Diagnosis not present

## 2013-12-13 DIAGNOSIS — R141 Gas pain: Secondary | ICD-10-CM | POA: Diagnosis not present

## 2013-12-20 ENCOUNTER — Encounter: Payer: Self-pay | Admitting: Family Medicine

## 2013-12-20 ENCOUNTER — Ambulatory Visit (INDEPENDENT_AMBULATORY_CARE_PROVIDER_SITE_OTHER): Payer: Medicare Other | Admitting: Family Medicine

## 2013-12-20 VITALS — BP 116/82 | Temp 98.7°F | Ht 73.0 in | Wt 208.0 lb

## 2013-12-20 DIAGNOSIS — J329 Chronic sinusitis, unspecified: Secondary | ICD-10-CM | POA: Diagnosis not present

## 2013-12-20 DIAGNOSIS — I2510 Atherosclerotic heart disease of native coronary artery without angina pectoris: Secondary | ICD-10-CM | POA: Diagnosis not present

## 2013-12-20 DIAGNOSIS — J310 Chronic rhinitis: Secondary | ICD-10-CM

## 2013-12-20 MED ORDER — SULFAMETHOXAZOLE-TMP DS 800-160 MG PO TABS: 1.0000 | ORAL_TABLET | Freq: Two times a day (BID) | ORAL | Status: DC

## 2013-12-20 NOTE — Progress Notes (Signed)
   Subjective:    Patient ID: [name] [name], male    DOB: 1940-11-28, 73 y.o.   MRN: [id number]  Cough This is a new problem. The current episode started 1 to 4 weeks ago. Associated symptoms include a sore throat.   Abd pain. Taking carafate. Endoscopy scheduled for next Tuesday. Patient developed anemia so going through GI workup at this time.  Cough productive at times. Yellowish phlegm. Frontal headache.  Review of Systems  HENT: Positive for sore throat.   Respiratory: Positive for cough.    No vomiting no diarrhea no rash ROS otherwise negative    Objective:   Physical Exam  Alert no apparent distress lungs clear heart regular in rhythm H&T frontal maxillary tenderness neck tender anterior nodes.      Assessment & Plan:  Impression rhinosinusitis plan antibiotics prescribed. Since Medicare discussed. WSL

## 2013-12-25 ENCOUNTER — Other Ambulatory Visit: Payer: Self-pay | Admitting: Gastroenterology

## 2013-12-25 DIAGNOSIS — D1330 Benign neoplasm of unspecified part of small intestine: Secondary | ICD-10-CM | POA: Diagnosis not present

## 2013-12-25 DIAGNOSIS — R195 Other fecal abnormalities: Secondary | ICD-10-CM | POA: Diagnosis not present

## 2013-12-25 DIAGNOSIS — D509 Iron deficiency anemia, unspecified: Secondary | ICD-10-CM | POA: Diagnosis not present

## 2013-12-25 DIAGNOSIS — R6881 Early satiety: Secondary | ICD-10-CM | POA: Diagnosis not present

## 2013-12-25 DIAGNOSIS — R197 Diarrhea, unspecified: Secondary | ICD-10-CM | POA: Diagnosis not present

## 2013-12-25 DIAGNOSIS — R109 Unspecified abdominal pain: Secondary | ICD-10-CM | POA: Diagnosis not present

## 2013-12-25 DIAGNOSIS — K2970 Gastritis, unspecified, without bleeding: Secondary | ICD-10-CM | POA: Diagnosis not present

## 2013-12-25 DIAGNOSIS — K2990 Gastroduodenitis, unspecified, without bleeding: Secondary | ICD-10-CM | POA: Diagnosis not present

## 2014-01-04 DIAGNOSIS — IMO0002 Reserved for concepts with insufficient information to code with codable children: Secondary | ICD-10-CM | POA: Diagnosis not present

## 2014-01-04 DIAGNOSIS — M48062 Spinal stenosis, lumbar region with neurogenic claudication: Secondary | ICD-10-CM | POA: Diagnosis not present

## 2014-01-04 DIAGNOSIS — M5137 Other intervertebral disc degeneration, lumbosacral region: Secondary | ICD-10-CM | POA: Diagnosis not present

## 2014-01-07 ENCOUNTER — Other Ambulatory Visit: Payer: Self-pay | Admitting: Family Medicine

## 2014-01-09 DIAGNOSIS — H33019 Retinal detachment with single break, unspecified eye: Secondary | ICD-10-CM | POA: Diagnosis not present

## 2014-01-15 ENCOUNTER — Encounter: Payer: Self-pay | Admitting: Family Medicine

## 2014-01-15 ENCOUNTER — Ambulatory Visit (INDEPENDENT_AMBULATORY_CARE_PROVIDER_SITE_OTHER): Payer: Medicare Other | Admitting: Family Medicine

## 2014-01-15 VITALS — BP 130/88 | Ht 73.0 in | Wt 207.0 lb

## 2014-01-15 DIAGNOSIS — R143 Flatulence: Secondary | ICD-10-CM | POA: Diagnosis not present

## 2014-01-15 DIAGNOSIS — R350 Frequency of micturition: Secondary | ICD-10-CM | POA: Diagnosis not present

## 2014-01-15 DIAGNOSIS — I2510 Atherosclerotic heart disease of native coronary artery without angina pectoris: Secondary | ICD-10-CM | POA: Diagnosis not present

## 2014-01-15 DIAGNOSIS — N410 Acute prostatitis: Secondary | ICD-10-CM | POA: Diagnosis not present

## 2014-01-15 DIAGNOSIS — R141 Gas pain: Secondary | ICD-10-CM | POA: Diagnosis not present

## 2014-01-15 DIAGNOSIS — R142 Eructation: Secondary | ICD-10-CM | POA: Diagnosis not present

## 2014-01-15 DIAGNOSIS — D509 Iron deficiency anemia, unspecified: Secondary | ICD-10-CM | POA: Diagnosis not present

## 2014-01-15 LAB — POCT URINALYSIS DIPSTICK
Spec Grav, UA: <=1.005
pH, UA: 6

## 2014-01-15 MED ORDER — SULFAMETHOXAZOLE-TMP DS 800-160 MG PO TABS: 1.0000 | ORAL_TABLET | Freq: Two times a day (BID) | ORAL | Status: DC

## 2014-01-15 MED ORDER — TAMSULOSIN HCL 0.4 MG PO CAPS: 0.4000 mg | ORAL_CAPSULE | Freq: Every day | ORAL | Status: DC

## 2014-01-15 NOTE — Progress Notes (Signed)
   Subjective:    Patient ID: [name] [name], male    DOB: [birth year], 73 y.o.   MRN: [id number]  HPI Patient here today with UTI symptoms. Patient only concerned about possible UTI.  Patient notes increased urinary frequency. Diffuse low abdominal and low back aching.  Increased urination at night.  Moderate dementia.  History of prostate infections.   Review of Systems No headache no chest pain no fever no vomiting no nausea ROS otherwise negative    Objective:   Physical Exam  Alert no apparent distress. Lungs clear. Heart rare in rhythm. Prostate boggy tender.  Urinalysis 2-4 whites per high-powered field.      Assessment & Plan:  Impression acute prostate infection. Plan antibiotics prescribed recognizing patient's many allergies. Also had Flomax. Rationale discussed. WSL

## 2014-01-22 DIAGNOSIS — Z6827 Body mass index (BMI) 27.0-27.9, adult: Secondary | ICD-10-CM | POA: Diagnosis not present

## 2014-01-22 DIAGNOSIS — IMO0002 Reserved for concepts with insufficient information to code with codable children: Secondary | ICD-10-CM | POA: Diagnosis not present

## 2014-01-22 DIAGNOSIS — H33019 Retinal detachment with single break, unspecified eye: Secondary | ICD-10-CM | POA: Diagnosis not present

## 2014-01-22 DIAGNOSIS — M1990 Unspecified osteoarthritis, unspecified site: Secondary | ICD-10-CM | POA: Diagnosis not present

## 2014-01-22 DIAGNOSIS — M48062 Spinal stenosis, lumbar region with neurogenic claudication: Secondary | ICD-10-CM | POA: Diagnosis not present

## 2014-01-29 DIAGNOSIS — IMO0002 Reserved for concepts with insufficient information to code with codable children: Secondary | ICD-10-CM | POA: Diagnosis not present

## 2014-01-29 DIAGNOSIS — M48062 Spinal stenosis, lumbar region with neurogenic claudication: Secondary | ICD-10-CM | POA: Diagnosis not present

## 2014-01-30 ENCOUNTER — Telehealth: Payer: Self-pay | Admitting: Family Medicine

## 2014-01-30 MED ORDER — ALBUTEROL SULFATE HFA 108 (90 BASE) MCG/ACT IN AERS: 2.0000 | INHALATION_SPRAY | Freq: Four times a day (QID) | RESPIRATORY_TRACT | Status: DC | PRN

## 2014-01-30 NOTE — Telephone Encounter (Signed)
Medication sent to pharmacy. Patient was notified.  

## 2014-01-30 NOTE — Telephone Encounter (Signed)
Patient needs Rx for albuterol (PROVENTIL HFA;VENTOLIN HFA) 108 (90 BASE) MCG/ACT inhaler   [employer]

## 2014-01-31 ENCOUNTER — Other Ambulatory Visit: Payer: Self-pay | Admitting: Gastroenterology

## 2014-01-31 DIAGNOSIS — D129 Benign neoplasm of anus and anal canal: Secondary | ICD-10-CM | POA: Diagnosis not present

## 2014-01-31 DIAGNOSIS — K644 Residual hemorrhoidal skin tags: Secondary | ICD-10-CM | POA: Diagnosis not present

## 2014-01-31 DIAGNOSIS — Z86010 Personal history of colonic polyps: Secondary | ICD-10-CM | POA: Diagnosis not present

## 2014-01-31 DIAGNOSIS — K648 Other hemorrhoids: Secondary | ICD-10-CM | POA: Diagnosis not present

## 2014-01-31 DIAGNOSIS — D126 Benign neoplasm of colon, unspecified: Secondary | ICD-10-CM | POA: Diagnosis not present

## 2014-01-31 DIAGNOSIS — D509 Iron deficiency anemia, unspecified: Secondary | ICD-10-CM | POA: Diagnosis not present

## 2014-01-31 DIAGNOSIS — R195 Other fecal abnormalities: Secondary | ICD-10-CM | POA: Diagnosis not present

## 2014-01-31 DIAGNOSIS — D128 Benign neoplasm of rectum: Secondary | ICD-10-CM | POA: Diagnosis not present

## 2014-01-31 DIAGNOSIS — K5730 Diverticulosis of large intestine without perforation or abscess without bleeding: Secondary | ICD-10-CM | POA: Diagnosis not present

## 2014-02-06 ENCOUNTER — Telehealth: Payer: Self-pay | Admitting: Family Medicine

## 2014-02-06 NOTE — Telephone Encounter (Signed)
Patient was seen 9/1 with prostate infection and given antibotics. He states still having problems with urination. It hurts and not able to pass enough water.It also burns.If need to be seen again or can you call something else into [place].

## 2014-02-06 NOTE — Telephone Encounter (Signed)
Discussed with pt. Pt states he has 10 more days left on antibiotic. Discussed with him to finish med and to call us back if having any symptoms after completing the antibiotics or call back sooner for recheck in office if worse.

## 2014-02-06 NOTE — Telephone Encounter (Signed)
Pt has claimed allergy to every other class of meds used for this, ref same for twenty one more d

## 2014-02-20 ENCOUNTER — Ambulatory Visit (INDEPENDENT_AMBULATORY_CARE_PROVIDER_SITE_OTHER): Payer: Medicare Other | Admitting: Family Medicine

## 2014-02-20 ENCOUNTER — Encounter: Payer: Self-pay | Admitting: Family Medicine

## 2014-02-20 VITALS — BP 122/80 | Temp 98.8°F | Ht 73.0 in | Wt 208.0 lb

## 2014-02-20 DIAGNOSIS — J209 Acute bronchitis, unspecified: Secondary | ICD-10-CM | POA: Diagnosis not present

## 2014-02-20 DIAGNOSIS — I2510 Atherosclerotic heart disease of native coronary artery without angina pectoris: Secondary | ICD-10-CM | POA: Diagnosis not present

## 2014-02-20 DIAGNOSIS — Z889 Allergy status to unspecified drugs, medicaments and biological substances status: Secondary | ICD-10-CM

## 2014-02-20 MED ORDER — CLINDAMYCIN HCL 150 MG PO CAPS: 150.0000 mg | ORAL_CAPSULE | Freq: Four times a day (QID) | ORAL | Status: DC

## 2014-02-20 NOTE — Progress Notes (Signed)
   Subjective:    Patient ID: DEVONTA BLANFORD, male    DOB: 10-16-1940, 73 y.o.   MRN: [id number]  Cough This is a new problem. The current episode started in the past 7 days. Associated symptoms include nasal congestion and wheezing. Treatments tried: albuteral, mucinex D. The treatment provided mild relief.    pt was taking bactrim and he started feeling shaky and having  trouble urinating. Symptoms went away after he stopped taking the antibiotic.   Patient cough productive at times.  Diminished energy.  Slight wheeziness at times. No high fevers so felt low-grade.  [phone]  Review of Systems  Respiratory: Positive for cough and wheezing.    No vomiting or diarrhea    Objective:   Physical Exam  Alert talkative no acute distress HEENT normal. Lungs no true wheezes currently no crackles no tachypnea bronchial cough during exam heart rare rhythm.     Assessment & Plan:  Impression subacute bronchitis #2 more problematic patient now claims allergy to sulfa. I reminded him that feeling shaky is extremely nonspecific, and trouble urinating was what he took Bactrim in the first place for he is still adamant that the Bactrim caused it. We now have reached the point where he has claimed allergy to virtually every antibiotic normally used. Plan clindamycin rationale discussed. Probiotics encourage. Allergy referral. WSLt

## 2014-02-26 ENCOUNTER — Telehealth: Payer: Self-pay | Admitting: Family Medicine

## 2014-02-26 NOTE — Telephone Encounter (Signed)
Patient calling to check on referral for allergist.

## 2014-02-27 ENCOUNTER — Encounter: Payer: Self-pay | Admitting: Family Medicine

## 2014-02-27 NOTE — Telephone Encounter (Signed)
Appt scheduled, [facility] & mailed letter to notify pt

## 2014-02-28 ENCOUNTER — Ambulatory Visit (INDEPENDENT_AMBULATORY_CARE_PROVIDER_SITE_OTHER): Payer: Medicare Other | Admitting: Family Medicine

## 2014-02-28 ENCOUNTER — Encounter: Payer: Self-pay | Admitting: Family Medicine

## 2014-02-28 VITALS — BP 120/88 | Temp 98.4°F | Ht 73.0 in | Wt 209.1 lb

## 2014-02-28 DIAGNOSIS — I2510 Atherosclerotic heart disease of native coronary artery without angina pectoris: Secondary | ICD-10-CM

## 2014-02-28 DIAGNOSIS — J209 Acute bronchitis, unspecified: Secondary | ICD-10-CM | POA: Diagnosis not present

## 2014-02-28 MED ORDER — FLUTICASONE PROPIONATE HFA 220 MCG/ACT IN AERO: 2.0000 | INHALATION_SPRAY | Freq: Two times a day (BID) | RESPIRATORY_TRACT | Status: DC

## 2014-02-28 MED ORDER — CLINDAMYCIN HCL 150 MG PO CAPS: 150.0000 mg | ORAL_CAPSULE | Freq: Four times a day (QID) | ORAL | Status: DC

## 2014-02-28 NOTE — Progress Notes (Signed)
   Subjective:    Patient ID: [name] [name], male    DOB: January 17, 1941, 73 y.o.   MRN: [id number]  Cough This is a new problem. The current episode started in the past 7 days. The problem has been unchanged. The cough is non-productive. Associated symptoms include rhinorrhea, shortness of breath and wheezing. Pertinent negatives include no chest pain, ear pain or fever. Nothing aggravates the symptoms. He has tried OTC cough suppressant (Clindamycin) for the symptoms. The treatment provided mild relief.  Patient states he has no other concerns at this time.     Review of Systems  Constitutional: Negative for fever and activity change.  HENT: Positive for congestion and rhinorrhea. Negative for ear pain.   Eyes: Negative for discharge.  Respiratory: Positive for cough, shortness of breath and wheezing.   Cardiovascular: Negative for chest pain.       Objective:   Physical Exam  Nursing note and vitals reviewed. Constitutional: He appears well-developed.  HENT:  Head: Normocephalic.  Mouth/Throat: Oropharynx is clear and moist. No oropharyngeal exudate.  Neck: Normal range of motion.  Cardiovascular: Normal rate, regular rhythm and normal heart sounds.   No murmur heard. Pulmonary/Chest: Effort normal and breath sounds normal. He has no wheezes.  Lymphadenopathy:    He has no cervical adenopathy.  Neurological: He exhibits normal muscle tone.  Skin: Skin is warm and dry.          Assessment & Plan:  Bronchitis Reactive airway Patient is concerned about this worsening. I assured him I found no evidence of pneumonia. Steroid inhaler prescribed to use over the next few weeks to reduce inflammation in the airways. 7 more days of antibiotics. No need for x-rays or blood work her pain. If worse followup flu vaccine in several weeks

## 2014-03-22 ENCOUNTER — Ambulatory Visit (INDEPENDENT_AMBULATORY_CARE_PROVIDER_SITE_OTHER): Payer: Medicare Other

## 2014-03-22 ENCOUNTER — Ambulatory Visit: Payer: Medicare Other

## 2014-03-22 DIAGNOSIS — Z23 Encounter for immunization: Secondary | ICD-10-CM | POA: Diagnosis not present

## 2014-03-28 DIAGNOSIS — D649 Anemia, unspecified: Secondary | ICD-10-CM | POA: Diagnosis not present

## 2014-03-28 DIAGNOSIS — Z86010 Personal history of colonic polyps: Secondary | ICD-10-CM | POA: Diagnosis not present

## 2014-04-02 DIAGNOSIS — Z889 Allergy status to unspecified drugs, medicaments and biological substances status: Secondary | ICD-10-CM | POA: Diagnosis not present

## 2014-04-02 DIAGNOSIS — J309 Allergic rhinitis, unspecified: Secondary | ICD-10-CM | POA: Diagnosis not present

## 2014-04-24 DIAGNOSIS — H538 Other visual disturbances: Secondary | ICD-10-CM | POA: Diagnosis not present

## 2014-04-24 DIAGNOSIS — H1013 Acute atopic conjunctivitis, bilateral: Secondary | ICD-10-CM | POA: Diagnosis not present

## 2014-04-25 DIAGNOSIS — M5416 Radiculopathy, lumbar region: Secondary | ICD-10-CM | POA: Diagnosis not present

## 2014-05-06 ENCOUNTER — Ambulatory Visit (HOSPITAL_COMMUNITY)
Admission: RE | Admit: 2014-05-06 | Discharge: 2014-05-06 | Disposition: A | Discharge status: Home / Self Care | Payer: Medicare Other | Source: Ambulatory Visit | Attending: Ophthalmology | Admitting: Ophthalmology

## 2014-05-06 ENCOUNTER — Encounter (HOSPITAL_COMMUNITY): Payer: Self-pay | Admitting: *Deleted

## 2014-05-06 ENCOUNTER — Encounter (HOSPITAL_COMMUNITY)
Admission: RE | Disposition: A | Discharge status: Home / Self Care | Payer: Self-pay | Source: Ambulatory Visit | Attending: Ophthalmology

## 2014-05-06 DIAGNOSIS — H538 Other visual disturbances: Secondary | ICD-10-CM | POA: Diagnosis not present

## 2014-05-06 DIAGNOSIS — H59092 Other disorders of the left eye following cataract surgery: Secondary | ICD-10-CM | POA: Insufficient documentation

## 2014-05-06 HISTORY — PX: YAG LASER APPLICATION: SHX6189

## 2014-05-06 SURGERY — TREATMENT, USING YAG LASER
Anesthesia: LOCAL | Laterality: Left

## 2014-05-06 MED ORDER — TETRACAINE HCL 0.5 % OP SOLN
1.0000 [drp] | Freq: Once | OPHTHALMIC | Status: AC
  Administered 2014-05-06: 1 [drp] via OPHTHALMIC

## 2014-05-06 MED ORDER — APRACLONIDINE HCL 1 % OP SOLN
1.0000 [drp] | OPHTHALMIC | Status: DC
  Administered 2014-05-06 (×2): 1 [drp] via OPHTHALMIC

## 2014-05-06 MED ORDER — TROPICAMIDE 1 % OP SOLN
1.0000 [drp] | OPHTHALMIC | Status: DC
  Administered 2014-05-06 (×2): 1 [drp] via OPHTHALMIC

## 2014-05-06 MED ORDER — TROPICAMIDE 1 % OP SOLN
OPHTHALMIC | Status: AC
  Filled 2014-05-06: qty 3

## 2014-05-06 MED ORDER — APRACLONIDINE HCL 1 % OP SOLN
OPHTHALMIC | Status: AC
  Filled 2014-05-06: qty 0.1

## 2014-05-06 MED ORDER — TETRACAINE HCL 0.5 % OP SOLN
OPHTHALMIC | Status: AC
  Filled 2014-05-06: qty 2

## 2014-05-06 NOTE — H&P (Signed)
I have reviewed the pre printed H&P, the patient was re-examined, and I have identified no significant interval changes in the patient's medical condition.  There is no change in the plan of care since the history and physical of record. 

## 2014-05-06 NOTE — Brief Op Note (Signed)
[name] [name] 05/06/2014  Williams Che, MD  Pre-op Diagnosis:  Anterior capsular phimosis/ posterior capsular cataract  Post-op Diagnosis: same  Yag laser self-test completed: Yes.    Indications:  Distorted vision OS  Procedure:  YAG ant./post. Capsulotomy OS  Eye protection worn by staff:  Yes.   Laser In Use sign on door:  Yes.    Laser:  {LUMENIS YAG/SLT LASER  Power Setting:  3.4 mJ/burst  Anatomical site treated:  Anterior capsular phimosis and post capsule opacity  Number of applications:  21  Total energy delivered: 70.4 mJ  Results:  Release ant capsule phimosis with tension on capsule/rewsolved posterior capsule cataract OS  Patient was instructed to go to the office, as previously scheduled, for intraocular pressure:  Yes.    Patient verbalizes understanding of discharge instructions:  Yes.

## 2014-05-06 NOTE — Discharge Instructions (Signed)
IZAAC REISIG  05/06/2014     Instructions    Activity: No Restrictions.   Diet: Resume Diet you were on at home.   Pain Medication: Tylenol if Needed.   CONTACT YOUR DOCTOR IF YOU HAVE PAIN, REDNESS IN YOUR EYE, OR DECREASED VISION.   Follow-up: 05/28/2014 at 11:15 with Williams Che, MD.    Dr. Iona Hansen: [phone]        If you find that you cannot contact your physician, but feel that your signs and   Symptoms warrant a physician's attention, call the Emergency Room at   [phone] [phone].

## 2014-05-07 ENCOUNTER — Encounter (HOSPITAL_COMMUNITY): Payer: Self-pay | Admitting: Ophthalmology

## 2014-05-17 HISTORY — PX: SPINAL FUSION: SHX223

## 2014-05-20 ENCOUNTER — Other Ambulatory Visit: Payer: Self-pay | Admitting: Family Medicine

## 2014-05-30 DIAGNOSIS — M5416 Radiculopathy, lumbar region: Secondary | ICD-10-CM | POA: Diagnosis not present

## 2014-05-30 DIAGNOSIS — Z6827 Body mass index (BMI) 27.0-27.9, adult: Secondary | ICD-10-CM | POA: Diagnosis not present

## 2014-05-30 DIAGNOSIS — M545 Low back pain: Secondary | ICD-10-CM | POA: Diagnosis not present

## 2014-07-03 ENCOUNTER — Other Ambulatory Visit: Payer: Self-pay | Admitting: Dermatology

## 2014-07-03 DIAGNOSIS — D485 Neoplasm of uncertain behavior of skin: Secondary | ICD-10-CM | POA: Diagnosis not present

## 2014-07-03 DIAGNOSIS — L080 Pyoderma: Secondary | ICD-10-CM | POA: Diagnosis not present

## 2014-07-03 DIAGNOSIS — D0422 Carcinoma in situ of skin of left ear and external auricular canal: Secondary | ICD-10-CM | POA: Diagnosis not present

## 2014-07-03 DIAGNOSIS — L570 Actinic keratosis: Secondary | ICD-10-CM | POA: Diagnosis not present

## 2014-07-03 DIAGNOSIS — D044 Carcinoma in situ of skin of scalp and neck: Secondary | ICD-10-CM | POA: Diagnosis not present

## 2014-07-04 ENCOUNTER — Encounter: Payer: Self-pay | Admitting: Cardiovascular Disease

## 2014-07-04 ENCOUNTER — Ambulatory Visit (INDEPENDENT_AMBULATORY_CARE_PROVIDER_SITE_OTHER): Payer: Medicare Other | Admitting: Cardiovascular Disease

## 2014-07-04 VITALS — BP 150/92 | HR 77 | Ht 73.0 in | Wt 208.8 lb

## 2014-07-04 DIAGNOSIS — I714 Abdominal aortic aneurysm, without rupture, unspecified: Secondary | ICD-10-CM

## 2014-07-04 DIAGNOSIS — J449 Chronic obstructive pulmonary disease, unspecified: Secondary | ICD-10-CM

## 2014-07-04 DIAGNOSIS — R030 Elevated blood-pressure reading, without diagnosis of hypertension: Secondary | ICD-10-CM | POA: Diagnosis not present

## 2014-07-04 DIAGNOSIS — I2510 Atherosclerotic heart disease of native coronary artery without angina pectoris: Secondary | ICD-10-CM | POA: Diagnosis not present

## 2014-07-04 DIAGNOSIS — E782 Mixed hyperlipidemia: Secondary | ICD-10-CM

## 2014-07-04 MED ORDER — DILTIAZEM HCL ER COATED BEADS 240 MG PO CP24: 240.0000 mg | ORAL_CAPSULE | Freq: Every day | ORAL | Status: DC

## 2014-07-04 MED ORDER — TRAMADOL HCL 50 MG PO TABS: 50.0000 mg | ORAL_TABLET | Freq: Two times a day (BID) | ORAL | Status: DC | PRN

## 2014-07-04 NOTE — Patient Instructions (Addendum)
Your physician recommends that you schedule a follow-up appointment in:   [place] has recommended you make the following change in your medication:  INCREASE  DILTIAZEM TO  240 MG  EVERY DAY TRAMADOL 50 MG  TWICE  DAILY

## 2014-07-04 NOTE — Assessment & Plan Note (Signed)
Using inhalers  Had prednisone taper  Mild end expitory wheezing fu pulmonary

## 2014-07-04 NOTE — Assessment & Plan Note (Signed)
Reviewed US done AP 4/15 3.7 cm will have f/u 4/16  No palpable mass or pain

## 2014-07-04 NOTE — Assessment & Plan Note (Signed)
Cholesterol is at goal.  Continue current dose of statin and diet Rx.  No myalgias or side effects.  F/U  LFT's in 6 months. Lab Results  Component Value Date   LDLCALC 70 11/30/2013

## 2014-07-04 NOTE — Progress Notes (Signed)
Patient ID: [name] [name], male   DOB: 1940/12/17, 74 y.o.   MRN: [id number] [name] [name] is seen today in followup for his coronary artery disease. 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 Olevia Perches I believe back in 1999. His last Myoview in July of 2009 was nonischemic. He has good LV function. His been compliant with his meds. He has a 3.6 x 3.4 cm AAA which needs a followup duplex 9/14 He has not had t any abdominal pain. He is active. 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 He prefers to have his duplex at [facility]. He continues to see [name] [name] for his primary care needs.  Told him I would like him to take Toprol for HTN, AAA and PAC;s /SVT  Reviewed event monitor from 5/9-10/22/10 PAC;s and a few short bursts of SVT 4 beats. Low dose Toprol started last visit in June. Patient felt no different on it and stopped it. No palpitations.  In f/u he did not tolerated Toprol or bystolic with multiple side effects.  Interestingly diarrhea occurred with both  Now tolerating calcium blocker   Has some back problems  Has had injection  Sciatica has caused inactivity and weight gain  .    ROS: Denies fever, malais, weight loss, blurry vision, decreased visual acuity, cough, sputum, SOB, hemoptysis, pleuritic pain, palpitaitons, heartburn, abdominal pain, melena, lower extremity edema, claudication, or rash.  All other systems reviewed and negative  General: Affect appropriate Healthy:  appears stated age 74: normal Neck supple with no adenopathy JVP normal no bruits no thyromegaly Lungs Mild exp wheezing and good diaphragmatic motion Heart:  S1/S2 no murmur, no rub, gallop or click PMI normal Abdomen: benighn, BS positve, no tenderness, no AAA no bruit.  No HSM or HJR Distal pulses intact with no bruits No edema Neuro non-focal Skin warm and dry No muscular weakness   Current Outpatient Prescriptions   Medication Sig Dispense Refill  . albuterol (PROVENTIL HFA;VENTOLIN HFA) 108 (90 BASE) MCG/ACT inhaler Inhale 2 puffs into the lungs every 6 (six) hours as needed for wheezing. 1 Inhaler 5  . albuterol (PROVENTIL) (2.5 MG/3ML) 0.083% nebulizer solution INHALE 1 VIAL VIA NEBULIZER FOUR TIMES DAILY. (Patient taking differently: INHALE 1 VIAL VIA NEBULIZER FOUR TIMES DAILY. prn) 375 mL 1  . aspirin EC 81 MG tablet Take 81 mg by mouth daily.    . cetirizine (ZYRTEC) 10 MG chewable tablet Chew 10 mg by mouth daily.      Marland Kitchen diltiazem (CARDIZEM CD) 240 MG 24 hr capsule Take 1 capsule (240 mg total) by mouth daily. 90 capsule 3  . esomeprazole (NEXIUM) 40 MG capsule Take 40 mg by mouth daily before breakfast.      . fluticasone (FLOVENT HFA) 220 MCG/ACT inhaler Inhale 2 puffs into the lungs 2 (two) times daily. 1 Inhaler 5  . IRON PO Take 325 mg by mouth daily. Take 1 Daily    . lovastatin (MEVACOR) 40 MG tablet TAKE 2 TABLETS BY MOUTH DAILY. 60 tablet 0  . montelukast (SINGULAIR) 10 MG tablet TAKE ONE TABLET BY MOUTH AT BEDTIME AS NEEDED FOR ALLERGIES. 30 tablet 2  . nitroGLYCERIN (NITROSTAT) 0.4 MG SL tablet Place 1 tablet (0.4 mg total) under the tongue every 5 (five) minutes as needed. 90 tablet 3  . ondansetron (ZOFRAN-ODT) 4 MG disintegrating tablet Take 4 mg by mouth as needed.  Nausea and vomiting    . traMADol (ULTRAM) 50 MG tablet Take 1 tablet (50 mg total) by mouth 2 (two) times daily as needed. 60 tablet 3   No current facility-administered medications for this visit.    Allergies  Beta adrenergic blockers; Cefdinir; Cefzil; Dexamethasone; Gatifloxacin; Methocarbamol; Neomycin; Tetracyclines & related; Ciprofloxacin; and Penicillins  Electrocardiogram:  07/17/12  SR rate 80  Normal 2/18  SR rate 77  PAC  Nonspecific ST changes   Assessment and Plan

## 2014-07-04 NOTE — Assessment & Plan Note (Signed)
Stable with no angina and good activity level.  Continue medical Rx  

## 2014-07-04 NOTE — Assessment & Plan Note (Signed)
Increase cardizem to 240 mg monitor at home f/u 3 months

## 2014-07-05 ENCOUNTER — Ambulatory Visit (INDEPENDENT_AMBULATORY_CARE_PROVIDER_SITE_OTHER): Payer: Medicare Other | Admitting: Family Medicine

## 2014-07-05 ENCOUNTER — Encounter: Payer: Self-pay | Admitting: Family Medicine

## 2014-07-05 VITALS — BP 140/90 | Temp 98.1°F | Ht 73.0 in | Wt 209.0 lb

## 2014-07-05 DIAGNOSIS — Z79899 Other long term (current) drug therapy: Secondary | ICD-10-CM

## 2014-07-05 DIAGNOSIS — G4485 Primary stabbing headache: Secondary | ICD-10-CM | POA: Diagnosis not present

## 2014-07-05 DIAGNOSIS — R5383 Other fatigue: Secondary | ICD-10-CM | POA: Diagnosis not present

## 2014-07-05 DIAGNOSIS — Z125 Encounter for screening for malignant neoplasm of prostate: Secondary | ICD-10-CM

## 2014-07-05 DIAGNOSIS — E785 Hyperlipidemia, unspecified: Secondary | ICD-10-CM

## 2014-07-05 DIAGNOSIS — I2510 Atherosclerotic heart disease of native coronary artery without angina pectoris: Secondary | ICD-10-CM | POA: Diagnosis not present

## 2014-07-05 NOTE — Patient Instructions (Signed)
Neuropathic pain of left ear

## 2014-07-05 NOTE — Progress Notes (Signed)
   Subjective:    Patient ID: [name] [name], male    DOB: [birth year], 74 y.o.   MRN: [id number]  Otalgia  There is pain in the left ear. This is a new problem. The current episode started today. The problem occurs every few minutes. The problem has been unchanged. There has been no fever. The pain is moderate. He has tried NSAIDs for the symptoms. The treatment provided no relief.   Patient states that he would like to talk with the doctor about having blood work done.   Patient has history of anemia. This occurred last year with blood loss. Led to subsequent surgical partial colectomy. Patient states feeling well. Wonders about his anemia status at this time. Occasional tiredness at times.  Compliant with lipid medication. No obvious side effects. Takes a couple faithfully. No blood work for 6 months.  Left ear pain and discomfort, no hx of such in the past  Review of Systems  HENT: Positive for ear pain.    No chest pain no shortness of breath no back pain abdominal pain no change in bowel habits no blood in stool ROS otherwise negative    Objective:   Physical Exam Alert no acute distress lungs clear heart rare rhythm H&T normal tympanic membrane normal lungs clear heart rare rhythm ankles without edema       Assessment & Plan:  Impression 1 neuropathic pain discussed at length. Sharp lancinating pain focused deep in left ear. Acute in nature. General nature of these discussed. #2 anemia status uncertain. #3 hyperlipidemia status uncertain. #4 impaired fasting glucose status uncertain plan appropriate blood work. Diet exercise discussed. Maintain current ibuprofen for pain. Tramadol recently prescribed encouraged to take this. WSL

## 2014-07-22 ENCOUNTER — Other Ambulatory Visit: Payer: Self-pay | Admitting: Family Medicine

## 2014-07-25 DIAGNOSIS — M545 Low back pain: Secondary | ICD-10-CM | POA: Diagnosis not present

## 2014-07-25 DIAGNOSIS — M5416 Radiculopathy, lumbar region: Secondary | ICD-10-CM | POA: Diagnosis not present

## 2014-07-26 DIAGNOSIS — R5383 Other fatigue: Secondary | ICD-10-CM | POA: Diagnosis not present

## 2014-07-26 DIAGNOSIS — E785 Hyperlipidemia, unspecified: Secondary | ICD-10-CM | POA: Diagnosis not present

## 2014-07-26 DIAGNOSIS — Z125 Encounter for screening for malignant neoplasm of prostate: Secondary | ICD-10-CM | POA: Diagnosis not present

## 2014-07-26 DIAGNOSIS — Z79899 Other long term (current) drug therapy: Secondary | ICD-10-CM | POA: Diagnosis not present

## 2014-07-26 LAB — LIPID PANEL
Cholesterol: 135 mg/dL (ref 0–200)
HDL: 50 mg/dL (ref >=40)
LDL Cholesterol: 72 mg/dL (ref 0–99)
Total CHOL/HDL Ratio: 2.7 Ratio
Triglycerides: 64 mg/dL (ref <150)
VLDL: 13 mg/dL (ref 0–40)

## 2014-07-26 LAB — CBC WITH DIFFERENTIAL/PLATELET
Basophils Absolute: 0 10*3/uL (ref 0.0–0.1)
Basophils Relative: 0 % (ref 0–1)
Eosinophils Absolute: 0 10*3/uL (ref 0.0–0.7)
Eosinophils Relative: 0 % (ref 0–5)
HCT: 43.2 % (ref 39.0–52.0)
Hemoglobin: 14.6 g/dL (ref 13.0–17.0)
Lymphocytes Relative: 6 % — ABNORMAL LOW (ref 12–46)
Lymphs Abs: 1.1 10*3/uL (ref 0.7–4.0)
MCH: 29 pg (ref 26.0–34.0)
MCHC: 33.8 g/dL (ref 30.0–36.0)
MCV: 85.9 fL (ref 78.0–100.0)
MPV: 10.8 fL (ref 8.6–12.4)
Monocytes Absolute: 0.7 10*3/uL (ref 0.1–1.0)
Monocytes Relative: 4 % (ref 3–12)
Neutro Abs: 16.1 10*3/uL — ABNORMAL HIGH (ref 1.7–7.7)
Neutrophils Relative %: 90 % — ABNORMAL HIGH (ref 43–77)
Platelets: 255 10*3/uL (ref 150–400)
RBC: 5.03 MIL/uL (ref 4.22–5.81)
RDW: 13.5 % (ref 11.5–15.5)
WBC: 17.9 10*3/uL — ABNORMAL HIGH (ref 4.0–10.5)

## 2014-07-26 LAB — HEPATIC FUNCTION PANEL
ALT: 20 U/L (ref 0–53)
AST: 16 U/L (ref 0–37)
Albumin: 4.1 g/dL (ref 3.5–5.2)
Alkaline Phosphatase: 67 U/L (ref 39–117)
Bilirubin, Direct: 0.1 mg/dL (ref 0.0–0.3)
Indirect Bilirubin: 0.3 mg/dL (ref 0.2–1.2)
Total Bilirubin: 0.4 mg/dL (ref 0.2–1.2)
Total Protein: 6.9 g/dL (ref 6.0–8.3)

## 2014-07-27 LAB — PSA: PSA: 1.8 ng/mL (ref <=4.00)

## 2014-07-30 ENCOUNTER — Telehealth: Payer: Self-pay | Admitting: *Deleted

## 2014-07-30 ENCOUNTER — Other Ambulatory Visit: Payer: Self-pay | Admitting: Family Medicine

## 2014-07-30 DIAGNOSIS — D72829 Elevated white blood cell count, unspecified: Secondary | ICD-10-CM

## 2014-07-30 NOTE — Telephone Encounter (Signed)
Ok repeat cbc tomorrow morn (order stat so it will be done then) and o v with me tom afternoon

## 2014-07-30 NOTE — Telephone Encounter (Signed)
Pt was in results to call and see if he had any signs of infection because of elevated wbc. Pt has not been seen. He is having some chest congestion, dry cough, started several days ago. no fever, and some wheezing. Pt states wheezing is normal for him.  Pt states he is using albuteral inhaler.

## 2014-07-30 NOTE — Telephone Encounter (Signed)
Discussed with pt. Cbc ordered stat for tomorrow. Pt transferred to front to schedule office visit tomorrow afternoon.

## 2014-07-31 ENCOUNTER — Other Ambulatory Visit: Payer: Self-pay | Admitting: *Deleted

## 2014-07-31 ENCOUNTER — Other Ambulatory Visit: Payer: Self-pay | Admitting: Family Medicine

## 2014-07-31 ENCOUNTER — Encounter: Payer: Self-pay | Admitting: Family Medicine

## 2014-07-31 ENCOUNTER — Ambulatory Visit (INDEPENDENT_AMBULATORY_CARE_PROVIDER_SITE_OTHER): Payer: Medicare Other | Admitting: Family Medicine

## 2014-07-31 ENCOUNTER — Ambulatory Visit (HOSPITAL_COMMUNITY)
Admission: RE | Admit: 2014-07-31 | Discharge: 2014-07-31 | Disposition: A | Discharge status: Home / Self Care | Payer: Medicare Other | Source: Ambulatory Visit | Attending: Family Medicine | Admitting: Family Medicine

## 2014-07-31 VITALS — BP 112/80 | Temp 98.4°F | Ht 73.0 in | Wt 210.4 lb

## 2014-07-31 DIAGNOSIS — R05 Cough: Secondary | ICD-10-CM | POA: Diagnosis not present

## 2014-07-31 DIAGNOSIS — D72829 Elevated white blood cell count, unspecified: Secondary | ICD-10-CM

## 2014-07-31 DIAGNOSIS — J449 Chronic obstructive pulmonary disease, unspecified: Secondary | ICD-10-CM | POA: Diagnosis not present

## 2014-07-31 DIAGNOSIS — I2510 Atherosclerotic heart disease of native coronary artery without angina pectoris: Secondary | ICD-10-CM | POA: Diagnosis not present

## 2014-07-31 DIAGNOSIS — Z87891 Personal history of nicotine dependence: Secondary | ICD-10-CM | POA: Diagnosis not present

## 2014-07-31 DIAGNOSIS — R059 Cough, unspecified: Secondary | ICD-10-CM

## 2014-07-31 LAB — CBC WITH DIFFERENTIAL/PLATELET
Basophils Absolute: 0 10*3/uL (ref 0.0–0.2)
Basos: 0 %
Eos: 5 %
Eosinophils Absolute: 0.6 10*3/uL — ABNORMAL HIGH (ref 0.0–0.4)
HCT: 41.7 % (ref 37.5–51.0)
Hemoglobin: 14.5 g/dL (ref 12.6–17.7)
Lymphocytes Absolute: 1.8 10*3/uL (ref 0.7–3.1)
Lymphs: 14 %
MCH: 29.5 pg (ref 26.6–33.0)
MCHC: 34.8 g/dL (ref 31.5–35.7)
MCV: 85 fL (ref 79–97)
Monocytes Absolute: 1.1 10*3/uL — ABNORMAL HIGH (ref 0.1–0.9)
Monocytes: 9 %
Neutrophils Absolute: 8.9 10*3/uL — ABNORMAL HIGH (ref 1.4–7.0)
Neutrophils Relative %: 72 %
Platelets: 233 10*3/uL (ref 150–379)
RBC: 4.92 x10E6/uL (ref 4.14–5.80)
RDW: 13.7 % (ref 12.3–15.4)
WBC: 12.3 10*3/uL — ABNORMAL HIGH (ref 3.4–10.8)

## 2014-07-31 MED ORDER — CEFDINIR 300 MG PO CAPS: 300.0000 mg | ORAL_CAPSULE | Freq: Two times a day (BID) | ORAL | Status: DC

## 2014-07-31 MED ORDER — LOVASTATIN 40 MG PO TABS: 80.0000 mg | ORAL_TABLET | Freq: Every day | ORAL | Status: DC

## 2014-07-31 NOTE — Progress Notes (Signed)
   Subjective:    Patient ID: [name] [name], male    DOB: 02/12/1941, 74 y.o.   MRN: [id number]  HPI Patient is here today because his white blood cell count is elevated. Patient has repeat blood work done this morning and results are in the system.   Known history of COPD. Burning in chest with deep breath. Increase cough. Sometimes productive.  No fever or chills. Next  Decent appetite.   Patient states that he has congestion in his chest, shortness of breath and sinus issues. This has been present for about 5-6 days now.  Patient does note he took a couple clindamycin tablets yesterday  Review of Systems No headache no exertional chest pain no abdominal pain no change in bowel habits    Objective:   Physical Exam  Alert no apparent distress talkative. Vital stable lungs no crackles or wheezes heart regular rhythm abdomen benign chest wall nontender      Assessment & Plan:  Impression 1 elevated white blood count now improved #2 anemia resolved. May stop iron supplement. #3 exacerbation COPD plan chest x-ray. Omnicef 300 twice a day 10 days. Symptomatic care discussed WSL

## 2014-08-13 ENCOUNTER — Telehealth: Payer: Self-pay | Admitting: Family Medicine

## 2014-08-13 MED ORDER — SULFAMETHOXAZOLE-TRIMETHOPRIM 800-160 MG PO TABS: 1.0000 | ORAL_TABLET | Freq: Two times a day (BID) | ORAL | Status: DC

## 2014-08-13 NOTE — Telephone Encounter (Signed)
Pt was seen an issued cefdinir on 3/15 He states he felt better toward the end of the antibiotic He now is not feeling better, has the chills, loss of voice,  Respiratory system irritated, achy all over   [place] apoth   Wants to know if he needs another round of antibiotic

## 2014-08-13 NOTE — Telephone Encounter (Signed)
Verified s/s with patient. Warning signs discussed. Per Dr. Nicki Reaper, send in Bactrim. If s/s persist, needs to be seen. Pt verbalized understanding.

## 2014-08-15 DIAGNOSIS — D044 Carcinoma in situ of skin of scalp and neck: Secondary | ICD-10-CM | POA: Diagnosis not present

## 2014-08-21 DIAGNOSIS — Z6827 Body mass index (BMI) 27.0-27.9, adult: Secondary | ICD-10-CM | POA: Diagnosis not present

## 2014-08-21 DIAGNOSIS — M5416 Radiculopathy, lumbar region: Secondary | ICD-10-CM | POA: Diagnosis not present

## 2014-08-21 DIAGNOSIS — I10 Essential (primary) hypertension: Secondary | ICD-10-CM | POA: Diagnosis not present

## 2014-08-21 DIAGNOSIS — M545 Low back pain: Secondary | ICD-10-CM | POA: Diagnosis not present

## 2014-09-02 DIAGNOSIS — M5416 Radiculopathy, lumbar region: Secondary | ICD-10-CM | POA: Diagnosis not present

## 2014-09-02 DIAGNOSIS — M4316 Spondylolisthesis, lumbar region: Secondary | ICD-10-CM | POA: Diagnosis not present

## 2014-09-02 DIAGNOSIS — M4806 Spinal stenosis, lumbar region: Secondary | ICD-10-CM | POA: Diagnosis not present

## 2014-09-02 DIAGNOSIS — M545 Low back pain: Secondary | ICD-10-CM | POA: Diagnosis not present

## 2014-09-02 DIAGNOSIS — Z6827 Body mass index (BMI) 27.0-27.9, adult: Secondary | ICD-10-CM | POA: Diagnosis not present

## 2014-09-06 DIAGNOSIS — M4806 Spinal stenosis, lumbar region: Secondary | ICD-10-CM | POA: Diagnosis not present

## 2014-09-06 DIAGNOSIS — M5126 Other intervertebral disc displacement, lumbar region: Secondary | ICD-10-CM | POA: Diagnosis not present

## 2014-09-11 ENCOUNTER — Telehealth: Payer: Self-pay | Admitting: *Deleted

## 2014-09-11 DIAGNOSIS — I714 Abdominal aortic aneurysm, without rupture, unspecified: Secondary | ICD-10-CM

## 2014-09-11 NOTE — Telephone Encounter (Signed)
SPOKE WITH  PT  IS  DUE FOR AAA TO  F/U ON  ANEURYSM WILL SEND ORDER TO [facility] TO HAVE DONE IN [facility]

## 2014-09-12 ENCOUNTER — Other Ambulatory Visit: Payer: Self-pay | Admitting: *Deleted

## 2014-09-12 DIAGNOSIS — I714 Abdominal aortic aneurysm, without rupture, unspecified: Secondary | ICD-10-CM

## 2014-09-17 ENCOUNTER — Ambulatory Visit (HOSPITAL_COMMUNITY)
Admission: RE | Admit: 2014-09-17 | Discharge: 2014-09-17 | Disposition: A | Discharge status: Home / Self Care | Payer: Medicare Other | Source: Ambulatory Visit | Attending: Cardiovascular Disease | Admitting: Cardiovascular Disease

## 2014-09-17 DIAGNOSIS — I714 Abdominal aortic aneurysm, without rupture, unspecified: Secondary | ICD-10-CM

## 2014-09-17 DIAGNOSIS — I7410 Embolism and thrombosis of unspecified parts of aorta: Secondary | ICD-10-CM | POA: Diagnosis not present

## 2014-09-20 ENCOUNTER — Other Ambulatory Visit: Payer: Self-pay | Admitting: Neurosurgery

## 2014-09-25 ENCOUNTER — Encounter: Payer: Self-pay | Admitting: Family Medicine

## 2014-09-25 ENCOUNTER — Ambulatory Visit (INDEPENDENT_AMBULATORY_CARE_PROVIDER_SITE_OTHER): Payer: Medicare Other | Admitting: Family Medicine

## 2014-09-25 VITALS — BP 140/86 | Temp 97.9°F | Ht 73.0 in | Wt 207.1 lb

## 2014-09-25 DIAGNOSIS — I2510 Atherosclerotic heart disease of native coronary artery without angina pectoris: Secondary | ICD-10-CM | POA: Diagnosis not present

## 2014-09-25 DIAGNOSIS — R300 Dysuria: Secondary | ICD-10-CM

## 2014-09-25 DIAGNOSIS — N410 Acute prostatitis: Secondary | ICD-10-CM

## 2014-09-25 LAB — POCT URINALYSIS DIPSTICK
Spec Grav, UA: 1.015
pH, UA: 6

## 2014-09-25 MED ORDER — SALSALATE 500 MG PO TABS: 500.0000 mg | ORAL_TABLET | Freq: Three times a day (TID) | ORAL | Status: DC

## 2014-09-25 MED ORDER — TAMSULOSIN HCL 0.4 MG PO CAPS: 0.4000 mg | ORAL_CAPSULE | Freq: Every day | ORAL | Status: DC

## 2014-09-25 MED ORDER — SULFAMETHOXAZOLE-TRIMETHOPRIM 800-160 MG PO TABS: 1.0000 | ORAL_TABLET | Freq: Two times a day (BID) | ORAL | Status: DC

## 2014-09-25 NOTE — Progress Notes (Signed)
   Subjective:    Patient ID: [name] [name], male    DOB: 1940/06/20, 74 y.o.   MRN: [id number]  Urinary Tract Infection  This is a new problem. The current episode started in the past 7 days. The problem has been gradually worsening. The quality of the pain is described as burning. The pain is moderate. Associated symptoms include chills, frequency and urgency. Associated symptoms comments: Lower Abdominal Pressure. He has tried nothing for the symptoms. The treatment provided no relief.     low abdominal discomfort nonspecific low back discomfort nonspecific. Some increased urinary frequency. Mild dysuria. Some increase nocturia  Review of Systems  Constitutional: Positive for chills.  Genitourinary: Positive for urgency and frequency.       Objective:   Physical Exam   alert no acute distress HEENT normal vital stable lungs clear heart regular rhythm abdomen benign prostate tender boggy  Urinalysis 2-3 white blood cells      Assessment & Plan:   impression prostatitis discussed plan antibiotics prescribed. Symptomatic care discussed. WSL

## 2014-09-30 NOTE — Progress Notes (Signed)
Patient ID: [name] [name], male   DOB: [birth year], 74 y.o.   MRN: [id number] [name] [name] is seen today in followup for his coronary artery disease. 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 Olevia Perches I believe back in 1999. His last Myoview in July of 2009 was nonischemic. He has good LV function. His been compliant with his meds. He has a 3.6 x 3.4 cm AAA by last duplex reviewed 09/2014   He has not had any abdominal pain. He is active. 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 He has two boxers at home that he is also active with.them.  He continues to see Mickie Hillier for his primary care needs.   Reviewed event monitor from 5/9-10/22/10 PAC;s and a few short bursts of SVT 4 beats. Low dose Toprol started last visit in June. Patient felt no different on it and stopped it. No palpitations.  In f/u he did not tolerated Toprol or bystolic with multiple side effects.  Interestingly diarrhea occurred with both  Now tolerating calcium blocker  Cardizem dose increased 06/2014 for HTN   Has some back problems  Has had injection  Sciatica has caused inactivity and weight gain   Preop clearance for 3 level lumbar fusion scheduled for June  .    ROS: Denies fever, malais, weight loss, blurry vision, decreased visual acuity, cough, sputum, SOB, hemoptysis, pleuritic pain, palpitaitons, heartburn, abdominal pain, melena, lower extremity edema, claudication, or rash.  All other systems reviewed and negative  General: Affect appropriate Healthy:  appears stated age 74: normal Neck supple with no adenopathy JVP normal no bruits no thyromegaly Lungs Mild exp wheezing and good diaphragmatic motion Heart:  S1/S2 no murmur, no rub, gallop or click PMI normal Abdomen: benighn, BS positve, no tenderness, no AAA no bruit.  No HSM or HJR Distal pulses intact with no bruits No edema Neuro non-focal Skin warm and dry No muscular weakness   Current Outpatient Prescriptions   Medication Sig Dispense Refill  . albuterol (PROVENTIL HFA;VENTOLIN HFA) 108 (90 BASE) MCG/ACT inhaler Inhale 2 puffs into the lungs every 6 (six) hours as needed for wheezing. 1 Inhaler 5  . albuterol (PROVENTIL) (2.5 MG/3ML) 0.083% nebulizer solution INHALE 1 VIAL VIA NEBULIZER FOUR TIMES DAILY. 375 mL 0  . aspirin EC 81 MG tablet Take 81 mg by mouth daily.    . cetirizine (ZYRTEC) 10 MG chewable tablet Chew 10 mg by mouth daily.      Marland Kitchen diltiazem (CARDIZEM CD) 240 MG 24 hr capsule Take 1 capsule (240 mg total) by mouth daily. 90 capsule 3  . esomeprazole (NEXIUM) 40 MG capsule Take 40 mg by mouth daily before breakfast.      . fluticasone (FLOVENT HFA) 220 MCG/ACT inhaler Inhale 2 puffs into the lungs 2 (two) times daily. 1 Inhaler 5  . lovastatin (MEVACOR) 40 MG tablet TAKE 2 TABLETS BY MOUTH DAILY. 60 tablet 0  . montelukast (SINGULAIR) 10 MG tablet TAKE ONE TABLET BY MOUTH AT BEDTIME AS NEEDED FOR ALLERGIES. 30 tablet 5  . nitroGLYCERIN (NITROSTAT) 0.4 MG SL tablet Place 1 tablet (0.4 mg total) under the tongue every 5 (five) minutes as needed. 90 tablet 3  . salsalate (DISALCID) 500 MG tablet Take 1 tablet (500 mg total) by mouth 3 (three) times daily. 42 tablet 0  . sulfamethoxazole-trimethoprim (BACTRIM DS,SEPTRA DS) 800-160 MG per tablet Take 1 tablet by mouth 2 (two) times daily. 60 tablet 0  .  tamsulosin (FLOMAX) 0.4 MG CAPS capsule Take 1 capsule (0.4 mg total) by mouth daily. 30 capsule 0  . traMADol (ULTRAM) 50 MG tablet Take 1 tablet (50 mg total) by mouth 2 (two) times daily as needed. 60 tablet 3   No current facility-administered medications for this visit.    Allergies  Beta adrenergic blockers; Cefdinir; Cefzil; Dexamethasone; Gatifloxacin; Methocarbamol; Neomycin; Tetracyclines & related; Ciprofloxacin; and Penicillins  Electrocardiogram:  07/17/12  SR rate 80  Norma  l 07/04/14  SR rate 77  PAC  Nonspecific ST changes   Assessment and Plan  CAD:  Distant history of RCA  PCI activity limited by left leg sciatica.  Moderate risk ortho surgery with 3 level lumbar fusion.  Lexiscan myovue ordered Will try to start atenolol for CAD and see if he tolerates it  Resting HR too high and risk of post op arrhythmias with history of PVC;s and SVT  Chol:   Cholesterol is at goal.  Continue current dose of statin and diet Rx.  No myalgias or side effects.  F/U  LFT's in 6 months. Lab Results  Component Value Date   LDLCALC 72 07/26/2014   HTN:  Well controlled.  Continue current medications and low sodium Dash type diet.      Jenkins Rouge

## 2014-10-01 DIAGNOSIS — L570 Actinic keratosis: Secondary | ICD-10-CM | POA: Diagnosis not present

## 2014-10-01 DIAGNOSIS — L723 Sebaceous cyst: Secondary | ICD-10-CM | POA: Diagnosis not present

## 2014-10-02 ENCOUNTER — Encounter: Payer: Self-pay | Admitting: Cardiovascular Disease

## 2014-10-02 ENCOUNTER — Ambulatory Visit (INDEPENDENT_AMBULATORY_CARE_PROVIDER_SITE_OTHER): Payer: Medicare Other | Admitting: Cardiovascular Disease

## 2014-10-02 VITALS — BP 110/70 | HR 96 | Ht 73.0 in | Wt 203.0 lb

## 2014-10-02 DIAGNOSIS — I2510 Atherosclerotic heart disease of native coronary artery without angina pectoris: Secondary | ICD-10-CM | POA: Diagnosis not present

## 2014-10-02 DIAGNOSIS — Z01818 Encounter for other preprocedural examination: Secondary | ICD-10-CM

## 2014-10-02 MED ORDER — ATENOLOL 25 MG PO TABS: 25.0000 mg | ORAL_TABLET | Freq: Every day | ORAL | Status: DC

## 2014-10-02 NOTE — Patient Instructions (Signed)
Medication Instructions:  NO CHANGES  Labwork: NONE  Testing/Procedures: Your physician has requested that you have a lexiscan myoview. For further information please visit [email or web address]. Please follow instruction sheet, as given.   Follow-Up: Your physician wants you to follow-up in:   6 MONTHS  WITH   DR  NISHAN  You will receive a reminder letter in the mail two months in advance. If you don't receive a letter, please call our office to schedule the follow-up appointment.  Any Other Special Instructions Will Be Listed Below (If Applicable).   

## 2014-10-02 NOTE — Addendum Note (Signed)
Addended by: Devra Dopp E on: 10/02/2014 10:52 AM   Modules accepted: Orders

## 2014-10-09 ENCOUNTER — Telehealth: Payer: Self-pay | Admitting: Cardiovascular Disease

## 2014-10-09 NOTE — Telephone Encounter (Signed)
SPOKE WITH PT   RE MESSAGE  PT  HAD  EPISODE  OF LIGHTHEADEDNESS  AND  ALMOST  PASSING OUT   TODAY  CHECKED  B/P WAS  91/65  AND P  WAS 61  B/P  NOW IS  98/64  ATENOLOL  WAS  STARTED  AT LAST OFFICE  VISIT  HEART  RATE  AT  THAT  VISIT  WAS  96  PER  PT IS  TAKING  DILTIAZEM AND  ATENOLOL  IN AM  INSTRUCTED  PT  TO TAKE   1  MED  IN AM  AND  THE  OTHER IN  PM  WILL FORWARD TO  DR Johnsie Cancel  FOR  REVIEW .Adonis Housekeeper

## 2014-10-09 NOTE — Telephone Encounter (Signed)
PT  NOTIFIED ./CY 

## 2014-10-09 NOTE — Telephone Encounter (Signed)
New message      Pt c/o BP issue: STAT if pt c/o blurred vision, one-sided weakness or slurred speech  1. What are your last 5 BP readings?  91/65,96/68, 98/66-----pulse 61-63  2. Are you having any other symptoms (ex. Dizziness, headache, blurred vision, passed out)? dizziness  3. What is your BP issue? bp rx was doubled in feb.  Atenolol was started in may.  Should he continue taking bp medication?

## 2014-10-09 NOTE — Telephone Encounter (Signed)
Stop cardizem and try to continue atenolol

## 2014-10-15 ENCOUNTER — Telehealth (HOSPITAL_COMMUNITY): Payer: Self-pay | Admitting: *Deleted

## 2014-10-15 NOTE — Telephone Encounter (Signed)
Left message on voicemail in reference to upcoming appointment scheduled for 10/16/14. Phone number given for a call back so details instructions can be given. Charlize Hathaway, Ranae Palms

## 2014-10-16 ENCOUNTER — Ambulatory Visit (HOSPITAL_COMMUNITY): Payer: Medicare Other | Attending: Cardiovascular Disease

## 2014-10-16 ENCOUNTER — Encounter: Payer: Self-pay | Admitting: Cardiovascular Disease

## 2014-10-16 DIAGNOSIS — Z01818 Encounter for other preprocedural examination: Secondary | ICD-10-CM

## 2014-10-16 DIAGNOSIS — Z01810 Encounter for preprocedural cardiovascular examination: Secondary | ICD-10-CM | POA: Diagnosis not present

## 2014-10-16 DIAGNOSIS — I2510 Atherosclerotic heart disease of native coronary artery without angina pectoris: Secondary | ICD-10-CM

## 2014-10-16 LAB — MYOCARDIAL PERFUSION IMAGING
LV dias vol: 105 mL
LV sys vol: 46 mL
Nuc Stress EF: 56 %
Peak HR: 99 {beats}/min
RATE: 0.35
Rest HR: 71 {beats}/min
SDS: 4
SRS: 1
SSS: 5
TID: 0.93

## 2014-10-16 MED ORDER — TECHNETIUM TC 99M SESTAMIBI GENERIC - CARDIOLITE
11.0000 | Freq: Once | INTRAVENOUS | Status: AC | PRN
  Administered 2014-10-16: 11 via INTRAVENOUS

## 2014-10-16 MED ORDER — REGADENOSON 0.4 MG/5ML IV SOLN
0.4000 mg | Freq: Once | INTRAVENOUS | Status: AC
  Administered 2014-10-16: 0.4 mg via INTRAVENOUS

## 2014-10-16 MED ORDER — TECHNETIUM TC 99M SESTAMIBI GENERIC - CARDIOLITE
33.0000 | Freq: Once | INTRAVENOUS | Status: AC | PRN
  Administered 2014-10-16: 33 via INTRAVENOUS

## 2014-10-17 DIAGNOSIS — H04123 Dry eye syndrome of bilateral lacrimal glands: Secondary | ICD-10-CM | POA: Diagnosis not present

## 2014-10-17 DIAGNOSIS — H538 Other visual disturbances: Secondary | ICD-10-CM | POA: Diagnosis not present

## 2014-10-17 DIAGNOSIS — H26491 Other secondary cataract, right eye: Secondary | ICD-10-CM | POA: Diagnosis not present

## 2014-10-21 ENCOUNTER — Telehealth: Payer: Self-pay | Admitting: *Deleted

## 2014-10-21 DIAGNOSIS — R030 Elevated blood-pressure reading, without diagnosis of hypertension: Secondary | ICD-10-CM

## 2014-10-21 MED ORDER — CARVEDILOL 6.25 MG PO TABS: 6.2500 mg | ORAL_TABLET | Freq: Two times a day (BID) | ORAL | Status: DC

## 2014-10-21 NOTE — Telephone Encounter (Signed)
Stop cardizem and atenolol  Start coreg 6.25 bid

## 2014-10-21 NOTE — Telephone Encounter (Signed)
LM TO CALL BACK ./CY 

## 2014-10-21 NOTE — Telephone Encounter (Signed)
Pt  Aware ./CY

## 2014-10-21 NOTE — Telephone Encounter (Signed)
WHILE  GIVING   MYOVIEW  RESULTS  PT  IS  COMPLAINING   WITH  INSOMNIA  SINCE  STARTING ATENOLOL  WANTING  TO KNOW  IF  COULD TRY ANOTHER  MED  WILL FORWARD TO  DR Johnsie Cancel FOR   RECOMMENDATIONS./CY

## 2014-10-23 ENCOUNTER — Other Ambulatory Visit: Payer: Self-pay | Admitting: Family Medicine

## 2014-10-24 DIAGNOSIS — M5416 Radiculopathy, lumbar region: Secondary | ICD-10-CM | POA: Diagnosis not present

## 2014-10-24 DIAGNOSIS — M4316 Spondylolisthesis, lumbar region: Secondary | ICD-10-CM | POA: Diagnosis not present

## 2014-10-24 DIAGNOSIS — M4806 Spinal stenosis, lumbar region: Secondary | ICD-10-CM | POA: Diagnosis not present

## 2014-10-26 NOTE — Pre-Procedure Instructions (Signed)
[name] [name]  10/26/2014     Your procedure is scheduled on June 21.  Report to [facility] Admitting at 8 A.M.  Call this number if you have problems the morning of surgery:  [phone]   Remember:  Do not eat food or drink liquids after midnight.  Take these medicines the morning of surgery with A SIP OF WATER Albuterol (if needed), Carvedilol, Zyrtec, Nexium, Tramadol (if needed)   STOP Probiotic, Fish Oil, Multiple Vitamin, Ibuprofen June 14   STOP/ Do not take Aspirin, Aleve, Naproxen, Advil, Ibuprofen, Motrin, Vitamins, Herbs, or Supplements starting June 14   Do not wear jewelry, make-up or nail polish.  Do not wear lotions, powders, or perfumes.  You may wear deodorant.  Do not shave 48 hours prior to surgery.  Men may shave face and neck.  Do not bring valuables to the hospital.  [facility] is not responsible for any belongings or valuables.  Contacts, dentures or bridgework may not be worn into surgery.  Leave your suitcase in the car.  After surgery it may be brought to your room.  For patients admitted to the hospital, discharge time will be determined by your treatment team.  Patients discharged the day of surgery will not be allowed to drive home.   [place] - Preparing for Surgery  Before surgery, you can play an important role.  Because skin is not sterile, your skin needs to be as free of germs as possible.  You can reduce the number of germs on you skin by washing with CHG (chlorahexidine gluconate) soap before surgery.  CHG is an antiseptic cleaner which kills germs and bonds with the skin to continue killing germs even after washing.  Please DO NOT use if you have an allergy to CHG or antibacterial soaps.  If your skin becomes reddened/irritated stop using the CHG and inform your nurse when you arrive at Short Stay.  Do not shave (including legs and underarms) for at least 48 hours prior to the first CHG shower.  You may shave your  face.  Please follow these instructions carefully:   1.  Shower with CHG Soap the night before surgery and the morning of Surgery.  2.  If you choose to wash your hair, wash your hair first as usual with your normal shampoo.  3.  After you shampoo, rinse your hair and body thoroughly to remove the shampoo.  4.  Use CHG as you would any other liquid soap.  You can apply CHG directly to the skin and wash gently with scrungie or a clean washcloth.  5.  Apply the CHG Soap to your body ONLY FROM THE NECK DOWN.  Do not use on open wounds or open sores.  Avoid contact with your eyes, ears, mouth and genitals (private parts).  Wash genitals (private parts) with your normal soap.  6.  Wash thoroughly, paying special attention to the area where your surgery will be performed.  7.  Thoroughly rinse your body with warm water from the neck down.  8.  DO NOT shower/wash with your normal soap after using and rinsing off the CHG Soap.  9.  Pat yourself dry with a clean towel.            10.  Wear clean pajamas.            11.  Place clean sheets on your bed the night of your first shower and do not sleep with  pets.  Day of Surgery  Do not apply any lotions the morning of surgery.  Please wear clean clothes to the hospital/surgery center.    Please read over the following fact sheets that you were given. Pain Booklet, Coughing and Deep Breathing, Blood Transfusion Information and Surgical Site Infection Prevention

## 2014-10-28 ENCOUNTER — Inpatient Hospital Stay (HOSPITAL_COMMUNITY)
Admission: RE | Admit: 2014-10-28 | Discharge: 2014-10-28 | Disposition: A | Discharge status: Home / Self Care | Payer: Medicare Other | Source: Ambulatory Visit

## 2014-10-28 ENCOUNTER — Telehealth: Payer: Self-pay | Admitting: Cardiovascular Disease

## 2014-10-28 MED ORDER — RANOLAZINE ER 500 MG PO TB12: 500.0000 mg | ORAL_TABLET | Freq: Two times a day (BID) | ORAL | Status: DC

## 2014-10-28 NOTE — Telephone Encounter (Signed)
Pt is having stomach issues with the Carvedilol--pls advise [phone]

## 2014-10-28 NOTE — Telephone Encounter (Signed)
Spoke with pt and informed him of new orders to d/c Coreg and start Ranexa 500 BID. Pt verbalized understanding and was in agreement. Placed samples up front for pt to pick up. Pt states he will be by Wednesday to pick up.

## 2014-10-28 NOTE — Telephone Encounter (Signed)
Patient is preop with CAD  Trying to get him on beta blocker to lower risk But seems that he is intolerant to bystolic, lopressor and coreg DC coreg Start ranexa 500 bid will keep him on this until post op

## 2014-10-28 NOTE — Telephone Encounter (Signed)
Pt calling to inform Dr Johnsie Cancel and nurse that since he started taking newly prescribed Carvedilol (6/6), it has started causing him severe diarrhea and stomach aches.  Pt states that this has happened to him in the past with a different med that Dr Johnsie Cancel prescribed him.  Pt states he looked the drug up as well and noted that nausea and diarrhea is a common side effect with Carvedilol.  Pt would like for Dr Johnsie Cancel to advise on a different regimen than carvedilol.  Informed the pt that Dr Johnsie Cancel and nurse are both out of the office today, but I will route this message to both of them for further review and recommendation of a different med regimen and follow-up with the pt thereafter.  Pt verbalized understanding and agrees with this plan.

## 2014-10-30 ENCOUNTER — Encounter (HOSPITAL_COMMUNITY): Payer: Self-pay

## 2014-10-30 ENCOUNTER — Encounter (HOSPITAL_COMMUNITY)
Admission: RE | Admit: 2014-10-30 | Discharge: 2014-10-30 | Disposition: A | Discharge status: Home / Self Care | Payer: Medicare Other | Source: Ambulatory Visit | Attending: Neurosurgery | Admitting: Neurosurgery

## 2014-10-30 ENCOUNTER — Ambulatory Visit (HOSPITAL_COMMUNITY)
Admission: RE | Admit: 2014-10-30 | Discharge: 2014-10-30 | Disposition: A | Discharge status: Home / Self Care | Payer: Medicare Other | Source: Ambulatory Visit | Attending: Anesthesiology | Admitting: Anesthesiology

## 2014-10-30 DIAGNOSIS — R509 Fever, unspecified: Secondary | ICD-10-CM

## 2014-10-30 DIAGNOSIS — R059 Cough, unspecified: Secondary | ICD-10-CM

## 2014-10-30 DIAGNOSIS — R05 Cough: Secondary | ICD-10-CM

## 2014-10-30 HISTORY — DX: Malignant (primary) neoplasm, unspecified: C80.1

## 2014-10-30 HISTORY — DX: Anemia, unspecified: D64.9

## 2014-10-30 HISTORY — DX: Cardiac arrhythmia, unspecified: I49.9

## 2014-10-30 LAB — CBC
HCT: 43.1 % (ref 39.0–52.0)
Hemoglobin: 14.5 g/dL (ref 13.0–17.0)
MCH: 28.9 pg (ref 26.0–34.0)
MCHC: 33.6 g/dL (ref 30.0–36.0)
MCV: 85.9 fL (ref 78.0–100.0)
Platelets: 206 10*3/uL (ref 150–400)
RBC: 5.02 MIL/uL (ref 4.22–5.81)
RDW: 14.5 % (ref 11.5–15.5)
WBC: 10.5 10*3/uL (ref 4.0–10.5)

## 2014-10-30 LAB — BASIC METABOLIC PANEL
Anion gap: 8 (ref 5–15)
BUN: 13 mg/dL (ref 6–20)
CO2: 29 mmol/L (ref 22–32)
Calcium: 9.2 mg/dL (ref 8.9–10.3)
Chloride: 101 mmol/L (ref 101–111)
Creatinine, Ser: 0.94 mg/dL (ref 0.61–1.24)
GFR calc Af Amer: >60 mL/min (ref >60)
GFR calc non Af Amer: >60 mL/min (ref >60)
Glucose, Bld: 105 mg/dL — ABNORMAL HIGH (ref 65–99)
Potassium: 3.8 mmol/L (ref 3.5–5.1)
Sodium: 138 mmol/L (ref 135–145)

## 2014-10-30 LAB — SURGICAL PCR SCREEN
MRSA, PCR: NEGATIVE
Staphylococcus aureus: NEGATIVE

## 2014-10-30 LAB — TYPE AND SCREEN
ABO/RH(D): O POS
Antibody Screen: NEGATIVE

## 2014-10-30 LAB — ABO/RH: ABO/RH(D): O POS

## 2014-10-30 NOTE — Progress Notes (Signed)
   10/30/14 1600  OBSTRUCTIVE SLEEP APNEA  Have you ever been diagnosed with sleep apnea through a sleep study? No  Do you snore loudly (loud enough to be heard through closed doors)?  0  Do you often feel tired, fatigued, or sleepy during the daytime? 1  Has anyone observed you stop breathing during your sleep? 0  Do you have, or are you being treated for high blood pressure? 1  BMI more than 35 kg/m2? 0  Age over 73 years old? 1  Neck circumference greater than 40 cm/16 inches? 1  Gender: 1  Obstructive Sleep Apnea Score 5

## 2014-10-30 NOTE — Progress Notes (Signed)
PCP- Dr. Wolfgang Phoenix  Cardiologist - Dr. Johnsie Cancel  Cardiac Cath- 1999  Stress Test - 10/2014  Echo- more than 5 years ago  EKG- 07/04/2014  CXR - 10/30/2014 for recent cough and fever

## 2014-11-04 MED ORDER — VANCOMYCIN HCL IN DEXTROSE 1-5 GM/200ML-% IV SOLN
1000.0000 mg | INTRAVENOUS | Status: AC
  Administered 2014-11-05: 1000 mg via INTRAVENOUS
  Filled 2014-11-04: qty 200

## 2014-11-05 ENCOUNTER — Encounter (HOSPITAL_COMMUNITY)
Admission: RE | Disposition: A | Discharge status: Home / Self Care | Payer: Self-pay | Source: Ambulatory Visit | Attending: Neurosurgery

## 2014-11-05 ENCOUNTER — Inpatient Hospital Stay (HOSPITAL_COMMUNITY): Payer: Medicare Other | Admitting: Anesthesiology

## 2014-11-05 ENCOUNTER — Inpatient Hospital Stay (HOSPITAL_COMMUNITY): Payer: Medicare Other

## 2014-11-05 ENCOUNTER — Inpatient Hospital Stay (HOSPITAL_COMMUNITY)
Admission: RE | Admit: 2014-11-05 | Discharge: 2014-11-06 | DRG: 460 | Disposition: A | Discharge status: Home / Self Care | Payer: Medicare Other | Source: Ambulatory Visit | Attending: Neurosurgery | Admitting: Neurosurgery

## 2014-11-05 DIAGNOSIS — M47817 Spondylosis without myelopathy or radiculopathy, lumbosacral region: Secondary | ICD-10-CM | POA: Diagnosis not present

## 2014-11-05 DIAGNOSIS — M4317 Spondylolisthesis, lumbosacral region: Secondary | ICD-10-CM | POA: Diagnosis not present

## 2014-11-05 DIAGNOSIS — Z79899 Other long term (current) drug therapy: Secondary | ICD-10-CM | POA: Diagnosis not present

## 2014-11-05 DIAGNOSIS — M47816 Spondylosis without myelopathy or radiculopathy, lumbar region: Principal | ICD-10-CM | POA: Diagnosis present

## 2014-11-05 DIAGNOSIS — K219 Gastro-esophageal reflux disease without esophagitis: Secondary | ICD-10-CM | POA: Diagnosis present

## 2014-11-05 DIAGNOSIS — Z881 Allergy status to other antibiotic agents status: Secondary | ICD-10-CM

## 2014-11-05 DIAGNOSIS — M4326 Fusion of spine, lumbar region: Secondary | ICD-10-CM

## 2014-11-05 DIAGNOSIS — E780 Pure hypercholesterolemia: Secondary | ICD-10-CM | POA: Diagnosis present

## 2014-11-05 DIAGNOSIS — Z87891 Personal history of nicotine dependence: Secondary | ICD-10-CM

## 2014-11-05 DIAGNOSIS — Z9842 Cataract extraction status, left eye: Secondary | ICD-10-CM

## 2014-11-05 DIAGNOSIS — I252 Old myocardial infarction: Secondary | ICD-10-CM | POA: Diagnosis not present

## 2014-11-05 DIAGNOSIS — Z880 Allergy status to penicillin: Secondary | ICD-10-CM | POA: Diagnosis not present

## 2014-11-05 DIAGNOSIS — M4316 Spondylolisthesis, lumbar region: Secondary | ICD-10-CM | POA: Diagnosis not present

## 2014-11-05 DIAGNOSIS — Z9841 Cataract extraction status, right eye: Secondary | ICD-10-CM | POA: Diagnosis not present

## 2014-11-05 DIAGNOSIS — Z961 Presence of intraocular lens: Secondary | ICD-10-CM | POA: Diagnosis present

## 2014-11-05 DIAGNOSIS — M4806 Spinal stenosis, lumbar region: Secondary | ICD-10-CM | POA: Diagnosis not present

## 2014-11-05 DIAGNOSIS — M4327 Fusion of spine, lumbosacral region: Secondary | ICD-10-CM | POA: Diagnosis not present

## 2014-11-05 DIAGNOSIS — M4807 Spinal stenosis, lumbosacral region: Secondary | ICD-10-CM | POA: Diagnosis not present

## 2014-11-05 DIAGNOSIS — J449 Chronic obstructive pulmonary disease, unspecified: Secondary | ICD-10-CM | POA: Diagnosis not present

## 2014-11-05 DIAGNOSIS — Z791 Long term (current) use of non-steroidal anti-inflammatories (NSAID): Secondary | ICD-10-CM | POA: Diagnosis not present

## 2014-11-05 DIAGNOSIS — I2510 Atherosclerotic heart disease of native coronary artery without angina pectoris: Secondary | ICD-10-CM | POA: Diagnosis not present

## 2014-11-05 DIAGNOSIS — I10 Essential (primary) hypertension: Secondary | ICD-10-CM | POA: Diagnosis present

## 2014-11-05 DIAGNOSIS — M79606 Pain in leg, unspecified: Secondary | ICD-10-CM | POA: Diagnosis not present

## 2014-11-05 DIAGNOSIS — Z888 Allergy status to other drugs, medicaments and biological substances status: Secondary | ICD-10-CM

## 2014-11-05 LAB — CBC
HCT: 37.1 % — ABNORMAL LOW (ref 39.0–52.0)
Hemoglobin: 12.3 g/dL — ABNORMAL LOW (ref 13.0–17.0)
MCH: 28.8 pg (ref 26.0–34.0)
MCHC: 33.2 g/dL (ref 30.0–36.0)
MCV: 86.9 fL (ref 78.0–100.0)
Platelets: 178 10*3/uL (ref 150–400)
RBC: 4.27 MIL/uL (ref 4.22–5.81)
RDW: 14.7 % (ref 11.5–15.5)
WBC: 18.6 10*3/uL — ABNORMAL HIGH (ref 4.0–10.5)

## 2014-11-05 LAB — CREATININE, SERUM
Creatinine, Ser: 0.91 mg/dL (ref 0.61–1.24)
GFR calc Af Amer: >60 mL/min (ref >60)
GFR calc non Af Amer: >60 mL/min (ref >60)

## 2014-11-05 SURGERY — POSTERIOR LUMBAR FUSION 3 LEVEL
Anesthesia: General | Site: Back

## 2014-11-05 MED ORDER — MULTIVITAMIN & MINERAL PO LIQD
Freq: Every day | ORAL | Status: DC
  Filled 2014-11-05: qty 5

## 2014-11-05 MED ORDER — RANOLAZINE ER 500 MG PO TB12
500.0000 mg | ORAL_TABLET | Freq: Two times a day (BID) | ORAL | Status: DC
  Administered 2014-11-06: 500 mg via ORAL
  Filled 2014-11-05 (×3): qty 1

## 2014-11-05 MED ORDER — SULFACETAMIDE SODIUM-SULFUR 10-5 % EX FOAM: 1.0000 "application " | Freq: Every day | CUTANEOUS | Status: DC | PRN

## 2014-11-05 MED ORDER — MONTELUKAST SODIUM 10 MG PO TABS
10.0000 mg | ORAL_TABLET | Freq: Every day | ORAL | Status: DC
  Filled 2014-11-05 (×2): qty 1

## 2014-11-05 MED ORDER — ROCURONIUM BROMIDE 100 MG/10ML IV SOLN
INTRAVENOUS | Status: DC | PRN
  Administered 2014-11-05: 50 mg via INTRAVENOUS

## 2014-11-05 MED ORDER — FENTANYL CITRATE (PF) 100 MCG/2ML IJ SOLN
25.0000 ug | INTRAMUSCULAR | Status: DC | PRN
  Administered 2014-11-05 (×2): 25 ug via INTRAVENOUS

## 2014-11-05 MED ORDER — PHENYLEPHRINE HCL 10 MG/ML IJ SOLN
10.0000 mg | INTRAMUSCULAR | Status: DC | PRN
  Administered 2014-11-05: 25 ug/min via INTRAVENOUS
  Administered 2014-11-05: 12:00:00 via INTRAVENOUS

## 2014-11-05 MED ORDER — SODIUM CHLORIDE 0.9 % IV SOLN: INTRAVENOUS | Status: DC

## 2014-11-05 MED ORDER — PHENYLEPHRINE HCL 10 MG/ML IJ SOLN
INTRAMUSCULAR | Status: DC | PRN
  Administered 2014-11-05: 120 ug via INTRAVENOUS

## 2014-11-05 MED ORDER — 0.9 % SODIUM CHLORIDE (POUR BTL) OPTIME
TOPICAL | Status: DC | PRN
  Administered 2014-11-05: 1000 mL

## 2014-11-05 MED ORDER — THROMBIN 20000 UNITS EX SOLR
CUTANEOUS | Status: DC | PRN
  Administered 2014-11-05: 20 mL via TOPICAL

## 2014-11-05 MED ORDER — FENTANYL CITRATE (PF) 100 MCG/2ML IJ SOLN
INTRAMUSCULAR | Status: DC | PRN
  Administered 2014-11-05: 50 ug via INTRAVENOUS
  Administered 2014-11-05: 100 ug via INTRAVENOUS
  Administered 2014-11-05 (×2): 50 ug via INTRAVENOUS

## 2014-11-05 MED ORDER — DIAZEPAM 5 MG PO TABS: 5.0000 mg | ORAL_TABLET | Freq: Four times a day (QID) | ORAL | Status: DC | PRN

## 2014-11-05 MED ORDER — SUCRALFATE 1 GM/10ML PO SUSP
1.0000 g | Freq: Three times a day (TID) | ORAL | Status: DC
  Administered 2014-11-05: 1 g via ORAL
  Filled 2014-11-05 (×7): qty 10

## 2014-11-05 MED ORDER — BISACODYL 10 MG RE SUPP: 10.0000 mg | Freq: Every day | RECTAL | Status: DC | PRN

## 2014-11-05 MED ORDER — ALBUTEROL SULFATE (2.5 MG/3ML) 0.083% IN NEBU: 3.0000 mL | INHALATION_SOLUTION | Freq: Four times a day (QID) | RESPIRATORY_TRACT | Status: DC | PRN

## 2014-11-05 MED ORDER — ADULT MULTIVITAMIN LIQUID CH
5.0000 mL | Freq: Every day | ORAL | Status: DC
  Filled 2014-11-05: qty 5

## 2014-11-05 MED ORDER — FENTANYL CITRATE (PF) 250 MCG/5ML IJ SOLN
INTRAMUSCULAR | Status: AC
  Filled 2014-11-05: qty 5

## 2014-11-05 MED ORDER — SODIUM CHLORIDE 0.9 % IR SOLN
Status: DC | PRN
  Administered 2014-11-05: 500 mL

## 2014-11-05 MED ORDER — ALBUTEROL SULFATE (2.5 MG/3ML) 0.083% IN NEBU: 2.5000 mg | INHALATION_SOLUTION | Freq: Four times a day (QID) | RESPIRATORY_TRACT | Status: DC | PRN

## 2014-11-05 MED ORDER — SODIUM CHLORIDE 0.9 % IJ SOLN
3.0000 mL | Freq: Two times a day (BID) | INTRAMUSCULAR | Status: DC
  Administered 2014-11-05 (×2): 3 mL via INTRAVENOUS

## 2014-11-05 MED ORDER — PRAVASTATIN SODIUM 40 MG PO TABS
40.0000 mg | ORAL_TABLET | Freq: Every day | ORAL | Status: DC
  Administered 2014-11-05: 40 mg via ORAL
  Filled 2014-11-05 (×2): qty 1

## 2014-11-05 MED ORDER — NITROGLYCERIN 0.4 MG SL SUBL: 0.4000 mg | SUBLINGUAL_TABLET | SUBLINGUAL | Status: DC | PRN

## 2014-11-05 MED ORDER — ACETAMINOPHEN 325 MG PO TABS: 650.0000 mg | ORAL_TABLET | ORAL | Status: DC | PRN

## 2014-11-05 MED ORDER — GLYCOPYRROLATE 0.2 MG/ML IJ SOLN
INTRAMUSCULAR | Status: DC | PRN
  Administered 2014-11-05: 0.4 mg via INTRAVENOUS

## 2014-11-05 MED ORDER — MENTHOL 3 MG MT LOZG
1.0000 | LOZENGE | OROMUCOSAL | Status: DC | PRN
Start: 2014-11-05 — End: 2014-11-06

## 2014-11-05 MED ORDER — BUPIVACAINE LIPOSOME 1.3 % IJ SUSP
INTRAMUSCULAR | Status: DC | PRN
  Administered 2014-11-05: 20 mL

## 2014-11-05 MED ORDER — ONDANSETRON HCL 4 MG/2ML IJ SOLN
INTRAMUSCULAR | Status: DC | PRN
  Administered 2014-11-05 (×2): 4 mg via INTRAVENOUS

## 2014-11-05 MED ORDER — LORATADINE 10 MG PO TABS
10.0000 mg | ORAL_TABLET | Freq: Every day | ORAL | Status: DC
  Filled 2014-11-05 (×2): qty 1

## 2014-11-05 MED ORDER — PROPOFOL 10 MG/ML IV BOLUS
INTRAVENOUS | Status: DC | PRN
  Administered 2014-11-05: 150 mg via INTRAVENOUS

## 2014-11-05 MED ORDER — VANCOMYCIN HCL IN DEXTROSE 1-5 GM/200ML-% IV SOLN
1000.0000 mg | Freq: Once | INTRAVENOUS | Status: AC
  Administered 2014-11-05: 1000 mg via INTRAVENOUS
  Filled 2014-11-05: qty 200

## 2014-11-05 MED ORDER — SODIUM CHLORIDE 0.9 % IJ SOLN: 3.0000 mL | INTRAMUSCULAR | Status: DC | PRN

## 2014-11-05 MED ORDER — BUPIVACAINE LIPOSOME 1.3 % IJ SUSP
20.0000 mL | Freq: Once | INTRAMUSCULAR | Status: DC
  Filled 2014-11-05: qty 20

## 2014-11-05 MED ORDER — SENNOSIDES-DOCUSATE SODIUM 8.6-50 MG PO TABS
1.0000 | ORAL_TABLET | Freq: Every evening | ORAL | Status: DC | PRN
  Filled 2014-11-05: qty 1

## 2014-11-05 MED ORDER — ONDANSETRON HCL 4 MG/2ML IJ SOLN: 4.0000 mg | INTRAMUSCULAR | Status: DC | PRN

## 2014-11-05 MED ORDER — LIDOCAINE HCL (CARDIAC) 20 MG/ML IV SOLN
INTRAVENOUS | Status: DC | PRN
  Administered 2014-11-05: 75 mg via INTRAVENOUS

## 2014-11-05 MED ORDER — LACTATED RINGERS IV SOLN
INTRAVENOUS | Status: DC
  Administered 2014-11-05: 09:00:00 via INTRAVENOUS

## 2014-11-05 MED ORDER — DEXAMETHASONE SODIUM PHOSPHATE 10 MG/ML IJ SOLN
INTRAMUSCULAR | Status: DC | PRN
  Administered 2014-11-05: 10 mg via INTRAVENOUS

## 2014-11-05 MED ORDER — OMEGA-3 FISH OIL 500 MG PO CAPS: ORAL_CAPSULE | Freq: Every day | ORAL | Status: DC

## 2014-11-05 MED ORDER — MIDAZOLAM HCL 2 MG/2ML IJ SOLN
INTRAMUSCULAR | Status: AC
  Filled 2014-11-05: qty 2

## 2014-11-05 MED ORDER — NEOSTIGMINE METHYLSULFATE 10 MG/10ML IV SOLN
INTRAVENOUS | Status: DC | PRN
  Administered 2014-11-05: 2 mg via INTRAVENOUS

## 2014-11-05 MED ORDER — DOCUSATE SODIUM 100 MG PO CAPS: 100.0000 mg | ORAL_CAPSULE | Freq: Two times a day (BID) | ORAL | Status: DC

## 2014-11-05 MED ORDER — HEPARIN SODIUM (PORCINE) 5000 UNIT/ML IJ SOLN
5000.0000 [IU] | Freq: Three times a day (TID) | INTRAMUSCULAR | Status: DC
  Administered 2014-11-06: 5000 [IU] via SUBCUTANEOUS
  Filled 2014-11-05 (×4): qty 1

## 2014-11-05 MED ORDER — PANTOPRAZOLE SODIUM 40 MG PO TBEC
80.0000 mg | DELAYED_RELEASE_TABLET | Freq: Every day | ORAL | Status: DC
  Administered 2014-11-06: 80 mg via ORAL
  Filled 2014-11-05: qty 2

## 2014-11-05 MED ORDER — FENTANYL CITRATE (PF) 100 MCG/2ML IJ SOLN
INTRAMUSCULAR | Status: AC
  Filled 2014-11-05: qty 2

## 2014-11-05 MED ORDER — SUCCINYLCHOLINE CHLORIDE 20 MG/ML IJ SOLN
INTRAMUSCULAR | Status: DC | PRN
  Administered 2014-11-05: 100 mg via INTRAVENOUS

## 2014-11-05 MED ORDER — THROMBIN 5000 UNITS EX SOLR
OROMUCOSAL | Status: DC | PRN
  Administered 2014-11-05 (×2): 10 mL via TOPICAL

## 2014-11-05 MED ORDER — DIAZEPAM 5 MG PO TABS
5.0000 mg | ORAL_TABLET | Freq: Four times a day (QID) | ORAL | Status: DC | PRN
  Administered 2014-11-05 – 2014-11-06 (×2): 5 mg via ORAL
  Filled 2014-11-05 (×2): qty 1

## 2014-11-05 MED ORDER — OXYCODONE-ACETAMINOPHEN 5-325 MG PO TABS
1.0000 | ORAL_TABLET | ORAL | Status: DC | PRN
  Administered 2014-11-05 – 2014-11-06 (×5): 2 via ORAL
  Filled 2014-11-05 (×5): qty 2

## 2014-11-05 MED ORDER — MIDAZOLAM HCL 5 MG/5ML IJ SOLN
INTRAMUSCULAR | Status: DC | PRN
  Administered 2014-11-05: 2 mg via INTRAVENOUS

## 2014-11-05 MED ORDER — LACTATED RINGERS IV SOLN
INTRAVENOUS | Status: DC | PRN
  Administered 2014-11-05 (×2): via INTRAVENOUS

## 2014-11-05 MED ORDER — ALBUMIN HUMAN 5 % IV SOLN
INTRAVENOUS | Status: DC | PRN
  Administered 2014-11-05: 12:00:00 via INTRAVENOUS

## 2014-11-05 MED ORDER — PHENOL 1.4 % MT LIQD
1.0000 | OROMUCOSAL | Status: DC | PRN
  Filled 2014-11-05: qty 177

## 2014-11-05 MED ORDER — ACETAMINOPHEN 650 MG RE SUPP: 650.0000 mg | RECTAL | Status: DC | PRN

## 2014-11-05 MED ORDER — PROBIOTIC PO CAPS: 1.0000 | ORAL_CAPSULE | Freq: Every day | ORAL | Status: DC

## 2014-11-05 MED ORDER — IBUPROFEN 200 MG PO TABS: 200.0000 mg | ORAL_TABLET | Freq: Three times a day (TID) | ORAL | Status: DC | PRN

## 2014-11-05 MED ORDER — ACETAMINOPHEN 10 MG/ML IV SOLN
INTRAVENOUS | Status: AC
  Administered 2014-11-05: 1000 mg via INTRAVENOUS
  Filled 2014-11-05: qty 100

## 2014-11-05 MED ORDER — RISAQUAD PO CAPS
1.0000 | ORAL_CAPSULE | Freq: Every day | ORAL | Status: DC
  Administered 2014-11-06: 1 via ORAL
  Filled 2014-11-05: qty 1

## 2014-11-05 MED ORDER — OXYCODONE-ACETAMINOPHEN 10-325 MG PO TABS: 1.0000 | ORAL_TABLET | ORAL | Status: DC | PRN

## 2014-11-05 MED ORDER — MORPHINE SULFATE 2 MG/ML IJ SOLN: 1.0000 mg | INTRAMUSCULAR | Status: DC | PRN

## 2014-11-05 MED ORDER — CARVEDILOL 6.25 MG PO TABS
6.2500 mg | ORAL_TABLET | Freq: Two times a day (BID) | ORAL | Status: DC
  Filled 2014-11-05 (×3): qty 1

## 2014-11-05 MED ORDER — PROPOFOL 10 MG/ML IV BOLUS
INTRAVENOUS | Status: AC
  Filled 2014-11-05: qty 20

## 2014-11-05 SURGICAL SUPPLY — 69 items
BAG DECANTER FOR FLEXI CONT (MISCELLANEOUS) ×2 IMPLANT
BENZOIN TINCTURE PRP APPL 2/3 (GAUZE/BANDAGES/DRESSINGS) IMPLANT
BLADE CLIPPER SURG (BLADE) IMPLANT
BLADE SURG 11 STRL SS (BLADE) ×2 IMPLANT
BUR MATCHSTICK NEURO 3.0 LAGG (BURR) ×2 IMPLANT
BUR PRECISION FLUTE 5.0 (BURR) ×2 IMPLANT
CAGE PEEK 9X22 (Cage) ×4 IMPLANT
CANISTER SUCT 3000ML PPV (MISCELLANEOUS) ×2 IMPLANT
CATH FOLEY 2WAY SLVR  5CC 14FR (CATHETERS) ×1
CATH FOLEY 2WAY SLVR 5CC 14FR (CATHETERS) ×1 IMPLANT
CONT SPEC 4OZ CLIKSEAL STRL BL (MISCELLANEOUS) ×2 IMPLANT
COVER BACK TABLE 60X90IN (DRAPES) ×2 IMPLANT
DECANTER SPIKE VIAL GLASS SM (MISCELLANEOUS) ×2 IMPLANT
DRAPE C-ARM 42X72 X-RAY (DRAPES) ×4 IMPLANT
DRAPE C-ARMOR (DRAPES) ×2 IMPLANT
DRAPE LAPAROTOMY 100X72X124 (DRAPES) ×2 IMPLANT
DRAPE MICROSCOPE LEICA (MISCELLANEOUS) ×2 IMPLANT
DRAPE POUCH INSTRU U-SHP 10X18 (DRAPES) ×2 IMPLANT
DRAPE SURG 17X23 STRL (DRAPES) ×2 IMPLANT
DRSG OPSITE POSTOP 4X6 (GAUZE/BANDAGES/DRESSINGS) ×2 IMPLANT
DURAPREP 26ML APPLICATOR (WOUND CARE) ×2 IMPLANT
ELECT REM PT RETURN 9FT ADLT (ELECTROSURGICAL) ×2
ELECTRODE REM PT RTRN 9FT ADLT (ELECTROSURGICAL) ×1 IMPLANT
GAUZE SPONGE 4X4 12PLY STRL (GAUZE/BANDAGES/DRESSINGS) IMPLANT
GAUZE SPONGE 4X4 16PLY XRAY LF (GAUZE/BANDAGES/DRESSINGS) IMPLANT
GLOVE BIOGEL PI IND STRL 7.5 (GLOVE) ×1 IMPLANT
GLOVE BIOGEL PI INDICATOR 7.5 (GLOVE) ×1
GLOVE ECLIPSE 7.0 STRL STRAW (GLOVE) ×2 IMPLANT
GLOVE EXAM NITRILE LRG STRL (GLOVE) IMPLANT
GLOVE EXAM NITRILE MD LF STRL (GLOVE) IMPLANT
GLOVE EXAM NITRILE XL STR (GLOVE) IMPLANT
GLOVE EXAM NITRILE XS STR PU (GLOVE) IMPLANT
GOWN STRL REUS W/ TWL LRG LVL3 (GOWN DISPOSABLE) ×2 IMPLANT
GOWN STRL REUS W/ TWL XL LVL3 (GOWN DISPOSABLE) IMPLANT
GOWN STRL REUS W/TWL 2XL LVL3 (GOWN DISPOSABLE) IMPLANT
GOWN STRL REUS W/TWL LRG LVL3 (GOWN DISPOSABLE) ×4
GOWN STRL REUS W/TWL XL LVL3 (GOWN DISPOSABLE)
HEMOSTAT POWDER KIT SURGIFOAM (HEMOSTASIS) ×2 IMPLANT
KIT BASIN OR (CUSTOM PROCEDURE TRAY) ×2 IMPLANT
KIT ROOM TURNOVER OR (KITS) ×2 IMPLANT
LIQUID BAND (GAUZE/BANDAGES/DRESSINGS) ×2 IMPLANT
NEEDLE HYPO 18GX1.5 BLUNT FILL (NEEDLE) IMPLANT
NEEDLE HYPO 25X1 1.5 SAFETY (NEEDLE) ×2 IMPLANT
NEEDLE SPNL 18GX3.5 QUINCKE PK (NEEDLE) IMPLANT
NS IRRIG 1000ML POUR BTL (IV SOLUTION) ×2 IMPLANT
PACK FOAM VITOSS 5CC (Orthopedic Implant) ×2 IMPLANT
PACK LAMINECTOMY NEURO (CUSTOM PROCEDURE TRAY) ×2 IMPLANT
PAD ARMBOARD 7.5X6 YLW CONV (MISCELLANEOUS) ×6 IMPLANT
ROD 40MM (Rod) ×1 IMPLANT
ROD COBALT 47.5X35 (Rod) ×2 IMPLANT
ROD SPNL CVD 40X4.75X (Rod) ×1 IMPLANT
RUBBERBAND STERILE (MISCELLANEOUS) ×4 IMPLANT
SCREW MAS 6.5X35 (Screw) ×4 IMPLANT
SCREW MAS 6.5X40 (Screw) ×4 IMPLANT
SCREW SET SOLERA (Screw) ×4 IMPLANT
SCREW SET SOLERA TI (Screw) ×4 IMPLANT
SPONGE LAP 4X18 X RAY DECT (DISPOSABLE) IMPLANT
SPONGE SURGIFOAM ABS GEL 100 (HEMOSTASIS) ×2 IMPLANT
STRIP CLOSURE SKIN 1/2X4 (GAUZE/BANDAGES/DRESSINGS) IMPLANT
SUT VIC AB 0 CT1 18XCR BRD8 (SUTURE) ×2 IMPLANT
SUT VIC AB 0 CT1 8-18 (SUTURE) ×4
SUT VIC AB 2-0 CT1 18 (SUTURE) IMPLANT
SUT VICRYL 3-0 RB1 18 ABS (SUTURE) ×4 IMPLANT
SYR 20ML ECCENTRIC (SYRINGE) ×2 IMPLANT
SYR 3ML LL SCALE MARK (SYRINGE) IMPLANT
TOWEL OR 17X24 6PK STRL BLUE (TOWEL DISPOSABLE) ×2 IMPLANT
TOWEL OR 17X26 10 PK STRL BLUE (TOWEL DISPOSABLE) ×2 IMPLANT
TRAP SPECIMEN MUCOUS 40CC (MISCELLANEOUS) ×2 IMPLANT
WATER STERILE IRR 1000ML POUR (IV SOLUTION) ×2 IMPLANT

## 2014-11-05 NOTE — Progress Notes (Signed)
Orthopedic Tech Progress Note Patient Details:  [name] [name] 1940-12-03 [id number] Brace order completed by bio-tech vendor. Patient ID: [name] [name], male   DOB: Feb 01, 1941, 74 y.o.   MRN: [id number]   [name] [name] 11/05/2014, 5:32 PM

## 2014-11-05 NOTE — Anesthesia Postprocedure Evaluation (Signed)
  Anesthesia Post-op Note  Patient: [name] [name]  Procedure(s) Performed: Procedure(s) with comments: POSTERIOR LUMBAR FUSION 1 LEVEL  (N/A) - L5S1 decompressions with posterior lumbar interbody fusion with interbody prosthesis posterior lateral arthrodesis and posterior segmental instrumentation  Patient Location: PACU  Anesthesia Type:General  Level of Consciousness: awake  Airway and Oxygen Therapy: Patient Spontanous Breathing  Post-op Pain: mild  Post-op Assessment: Post-op Vital signs reviewed LLE Motor Response: Purposeful movement, Responds to commands LLE Sensation: No numbness RLE Motor Response: Purposeful movement, Responds to commands RLE Sensation: No numbness      Post-op Vital Signs: Reviewed  Last Vitals:  Filed Vitals:   11/05/14 1619  BP: 124/68  Pulse: 83  Temp: 36.6 C  Resp: 18    Complications: No apparent anesthesia complications

## 2014-11-05 NOTE — Plan of Care (Signed)
Problem: Consults Goal: Diagnosis - Spinal Surgery Outcome: Completed/Met Date Met:  11/05/14 Thoraco/Lumbar Spine Fusion

## 2014-11-05 NOTE — Evaluation (Signed)
Occupational Therapy Evaluation Patient Details Name: [name] [name] MRN: [id number] DOB: Aug 17, 1940 Today's Date: 11/05/2014    History of Present Illness 74 y.o. s/p L5-S1 decompressions with posterior lumbar interbody fusion with interbody prosthesis posterior lateral arthrodesis and posterior segmental instrumentation   Clinical Impression   Pt s/p above. Pt independent with ADLs, PTA. Feel pt will benefit from acute OT to increase independence and reinforce precautions prior to d/c.     Follow Up Recommendations  No OT follow up;Supervision - Intermittent    Equipment Recommendations  None recommended by OT    Recommendations for Other Services       Precautions / Restrictions Precautions Precautions: Back;Fall Precaution Booklet Issued: Yes (comment) Precaution Comments: educated on back precautions Required Braces or Orthoses: Spinal Brace Spinal Brace:  (already on in session; doffed in sitting) Restrictions Weight Bearing Restrictions: No      Mobility Bed Mobility Overal bed mobility: Needs Assistance Bed Mobility: Rolling;Sit to Sidelying Rolling: Supervision       Sit to sidelying: Supervision General bed mobility comments: cues for technique. Explained how to get back out of bed.  Transfers Overall transfer level: Needs assistance Equipment used: None Transfers: Sit to/from Stand Sit to Stand: Min guard              Balance  Pt unsteady with ambulation-Min guard                                           ADL Overall ADL's : Needs assistance/impaired Eating/Feeding: Independent;Sitting   Grooming: Set up;Supervision/safety;Sitting           Upper Body Dressing : Set up;Supervision/safety;Sitting (doffed brace; educated how to don brace)   Lower Body Dressing: Min guard;Sit to/from stand   Toilet Transfer: Min guard;Ambulation (bed)           Functional mobility during ADLs: Min guard General ADL Comments:  Educated on LB ADL technique. Pt able to cross legs, but seemed effortful. Instructed him not to lift more than 5 pounds. Educated on AE, including what pt could use for toilet aide if hygiene is an issue. Educated on back brace. Discussed incorporating precautions into functional activities. Informed pt to have pillow between legs when on side.      Vision     Perception     Praxis      Pertinent Vitals/Pain Pain Assessment: 0-10 Pain Score: 5  Pain Location: back and stomach Pain Descriptors / Indicators: Spasm;Sore Pain Intervention(s): Monitored during session;Repositioned     Hand Dominance Right   Extremity/Trunk Assessment Upper Extremity Assessment Upper Extremity Assessment: Overall WFL for tasks assessed   Lower Extremity Assessment Lower Extremity Assessment: Defer to PT evaluation       Communication Communication Communication: HOH   Cognition Arousal/Alertness: Awake/alert Behavior During Therapy: WFL for tasks assessed/performed Overall Cognitive Status: Within Functional Limits for tasks assessed                     General Comments       Exercises       Shoulder Instructions      Home Living Family/patient expects to be discharged to:: Private residence Living Arrangements: Spouse/significant other Available Help at Discharge: Family;Available 24 hours/day Type of Home: House Home Access: Stairs to enter [employer] of Steps: 2 Entrance Stairs-Rails: None Home Layout: Two level;Able to live  on main level with bedroom/bathroom     Bathroom Shower/Tub: Occupational psychologist: Handicapped height     Home Equipment: Environmental consultant - 2 wheels;Walker - 4 wheels;Shower seat;Shower seat - built in;Bedside commode          Prior Functioning/Environment Level of Independence: Independent             OT Diagnosis: Acute pain   OT Problem List: Decreased strength;Impaired balance (sitting and/or standing);Decreased  knowledge of use of DME or AE;Decreased knowledge of precautions;Pain;Decreased range of motion   OT Treatment/Interventions: Self-care/ADL training;DME and/or AE instruction;Patient/family education;Balance training;Therapeutic activities    OT Goals(Current goals can be found in the care plan section) Acute Rehab OT Goals Patient Stated Goal: go home OT Goal Formulation: With patient Time For Goal Achievement: 11/12/14 Potential to Achieve Goals: Good ADL Goals Pt Will Perform Grooming: with set-up;standing Pt Will Perform Lower Body Dressing: with set-up;sit to/from stand Pt Will Transfer to Toilet: with supervision;ambulating (elevated toilet) Pt Will Perform Toileting - Clothing Manipulation and hygiene: with modified independence;sit to/from stand Additional ADL Goal #1: Pt will independently verbalize 3/3 back precautions and maintain during session. Additional ADL Goal #2: Pt will don/doff back brace with setup assist.  OT Frequency: Min 2X/week   Barriers to D/C:            Co-evaluation              End of Session Equipment Utilized During Treatment: Gait belt;Back brace Nurse Communication: Other (comment) (no followup; want to see one more time)  Activity Tolerance: Patient tolerated treatment well Patient left: in bed;with call bell/phone within reach;with family/visitor present   Time: 7262-0355 OT Time Calculation (min): 16 min Charges:  OT General Charges $OT Visit: 1 Procedure OT Evaluation $Initial OT Evaluation Tier I: 1 Procedure G-CodesBenito Mccreedy  OTR/L [id number] 11/05/2014, 5:41 PM

## 2014-11-05 NOTE — Progress Notes (Signed)
Orthopedic Tech Progress Note Patient Details:  [name] [name] 11-14-1940 [id number]  Patient ID: [name], male   DOB: Sep 15, 1940, 74 y.o.   MRN: [id number] Called in bio-tech brace order; spoke with Dolores Lory, Ace Bergfeld 11/05/2014, 3:32 PM

## 2014-11-05 NOTE — Progress Notes (Signed)
Pt seen in pacu. Awake, following commands. MAE well, 5/5 PF/DF.

## 2014-11-05 NOTE — H&P (Signed)
CC:  Back and leg pain  HPI: [name] [name] is a 74 y.o. male who initially presented with back and leg pain. He initially did well with conservative treatments but these eventually stopped being efficacious and he now presents for surgical decompression/fusion.  PMH: Past Medical History  Diagnosis Date  . AAA (abdominal aortic aneurysm)     needs yearly ultrasound  . Hypertension   . COPD (chronic obstructive pulmonary disease)   . Hypercholesterolemia   . MI (myocardial infarction) 1999  . BPH (benign prostatic hyperplasia)   . GERD (gastroesophageal reflux disease)   . Arthritis   . Glaucoma   . Impaired fasting glucose   . Allergy   . Asthma   . CAD (coronary artery disease)   . HOH (hard of hearing)   . Cancer     skin cancer  . Anemia   . Dysrhythmia     pt. states it can be fast at times    PSH: Past Surgical History  Procedure Laterality Date  . Cardiac catheterization      angioplasty  . Back surgery      x 3  . Right eye detached retina Bilateral   . Cholecystectomy  2000  . Cataract extraction w/phaco  03/20/2012    Procedure: CATARACT EXTRACTION PHACO AND INTRAOCULAR LENS PLACEMENT (IOC);  Surgeon: Williams Che, MD;  Location: [facility];  Service: Ophthalmology;  Laterality: Right;  CDE:  8.45  . Colonoscopy  2009    repeat 5 years  . Cataract extraction w/phaco Left 04/02/2013    Procedure: CATARACT EXTRACTION PHACO AND INTRAOCULAR LENS PLACEMENT (IOC);  Surgeon: Williams Che, MD;  Location: [facility];  Service: Ophthalmology;  Laterality: Left;  CDE:  6.50  . Laparoscopic partial colectomy N/A 06/11/2013    Procedure: LAPAROSCOPIC HAND ASSISTED PARTIAL COLECTOMY;  Surgeon: Jamesetta So, MD;  Location: [facility];  Service: General;  Laterality: N/A;  . Yag laser application Left 82/42/3536    Procedure: YAG LASER APPLICATION;  Surgeon: Williams Che, MD;  Location: [facility];  Service: Ophthalmology;  Laterality: Left;  . Esophagogastroduodenoscopy    .  Hernia repair Left     inguinal    SH: History  Substance Use Topics  . Smoking status: Former Smoker -- 1.00 packs/day for 35 years    Types: Cigarettes    Quit date: 03/28/2003  . Smokeless tobacco: Not on file  . Alcohol Use: No    MEDS: Prior to Admission medications   Medication Sig Start Date End Date Taking? Authorizing Provider  albuterol (PROVENTIL HFA;VENTOLIN HFA) 108 (90 BASE) MCG/ACT inhaler Inhale 2 puffs into the lungs every 6 (six) hours as needed for wheezing. 01/30/14  Yes Mikey Kirschner, MD  albuterol (PROVENTIL) (2.5 MG/3ML) 0.083% nebulizer solution INHALE 1 VIAL VIA NEBULIZER FOUR TIMES DAILY. Patient taking differently: INHALE 1 VIAL VIA NEBULIZER ONE TO TWO TIMES DAILY AS NEEDED 10/24/14  Yes Mikey Kirschner, MD  carvedilol (COREG) 6.25 MG tablet Take 1 tablet (6.25 mg total) by mouth 2 (two) times daily. 10/21/14  Yes Josue Hector, MD  cetirizine (ZYRTEC) 10 MG tablet Take 10 mg by mouth daily.   Yes Historical Provider, MD  esomeprazole (NEXIUM) 40 MG capsule Take 40 mg by mouth daily before breakfast.     Yes Historical Provider, MD  ibuprofen (ADVIL,MOTRIN) 200 MG tablet Take 200-400 mg by mouth 3 (three) times daily as needed for mild pain or moderate pain.   Yes  Historical Provider, MD  lovastatin (MEVACOR) 40 MG tablet TAKE 2 TABLETS BY MOUTH DAILY. Patient taking differently: TAKE 1 TABLET BY MOUTH DAILY. 07/31/14  Yes Mikey Kirschner, MD  montelukast (SINGULAIR) 10 MG tablet TAKE ONE TABLET BY MOUTH AT BEDTIME AS NEEDED FOR ALLERGIES. 07/22/14  Yes Mikey Kirschner, MD  Multiple Vitamins-Minerals (MULTIVITAMIN & MINERAL PO) Take 1 tablet by mouth daily.   Yes Historical Provider, MD  nitroGLYCERIN (NITROSTAT) 0.4 MG SL tablet Place 1 tablet (0.4 mg total) under the tongue every 5 (five) minutes as needed. 11/04/10  Yes Josue Hector, MD  Omega-3 Fatty Acids (OMEGA-3 FISH OIL PO) Take 1 tablet by mouth daily.   Yes Historical Provider, MD  Probiotic CAPS  Take 1 capsule by mouth daily.   Yes Historical Provider, MD  ranolazine (RANEXA) 500 MG 12 hr tablet Take 1 tablet (500 mg total) by mouth 2 (two) times daily. 10/28/14  Yes Josue Hector, MD  Sulfacetamide Sodium-Sulfur 10-5 % FOAM Apply 1 application topically daily as needed (rosazea).   Yes Historical Provider, MD  traMADol (ULTRAM) 50 MG tablet Take 1 tablet (50 mg total) by mouth 2 (two) times daily as needed. 07/04/14  Yes Josue Hector, MD    ALLERGY: Allergies  Allergen Reactions  . Cefzil [Cefprozil] Nausea Only  . Dexamethasone Swelling  . Methocarbamol Swelling  . Neomycin Other (See Comments)    Patient can't remember.  . Tetracyclines & Related Itching  . Ciprofloxacin Nausea And Vomiting and Rash  . Penicillins Rash    ROS: ROS  NEUROLOGIC EXAM: Awake, alert, oriented Memory and concentration grossly intact Speech fluent, appropriate CN grossly intact Motor exam: Upper Extremities Deltoid Bicep Tricep Grip  Right 5/5 5/5 5/5 5/5  Left 5/5 5/5 5/5 5/5   Lower Extremity IP Quad PF DF EHL  Right 5/5 5/5 5/5 5/5 5/5  Left 5/5 5/5 5/5 5/5 5/5   Sensation grossly intact to LT  [facility]: MRI lumbar spine was reviewed which demonstrates worst central stenosis at L5-S1 with bilateral pars defects and foraminal stenosis. There is previous laminectomy at L3-4, with mild central stenosis at L3-4 and L4-5. There is mild to moderate foraminal stenosis at these levels.  IMPRESSION: - 74 y.o. male with symptoms primarily of mechanical back pain and neurogenic claudication. This is most likely related to disease at L5-S1, although some contribution from the above levels is possible.  PLAN: - Proceed with surgical decompression and fusion, likely at L5-S1 alone.  We did discuss surgical options in the office, including the possiblity of addressing L3-4, L4-5 and L5-S1. Upon further review I believe his symptoms can be largely addressed by fusion at the L5-S1 level. I have  therefore explained this to the patient and his family. The possible need for future surgery at the other levels (L3-4 and L4-5) was explained. They are agreeable to the amended plan. All questions were answered.

## 2014-11-05 NOTE — Anesthesia Procedure Notes (Signed)
Procedure Name: Intubation Date/Time: 11/05/2014 9:38 AM Performed by: Neldon Newport Pre-anesthesia Checklist: Patient being monitored, Suction available, Emergency Drugs available, Patient identified and Timeout performed Patient Re-evaluated:Patient Re-evaluated prior to inductionOxygen Delivery Method: Circle system utilized Preoxygenation: Pre-oxygenation with 100% oxygen Intubation Type: IV induction, Rapid sequence and Cricoid Pressure applied Ventilation: Mask ventilation without difficulty Laryngoscope Size: Mac and 4 Grade View: Grade I Tube type: Oral Tube size: 7.5 mm Number of attempts: 1 Placement Confirmation: positive ETCO2,  ETT inserted through vocal cords under direct vision and breath sounds checked- equal and bilateral Secured at: 23 cm Tube secured with: Tape Dental Injury: Teeth and Oropharynx as per pre-operative assessment

## 2014-11-05 NOTE — Op Note (Signed)
PREOP DIAGNOSIS:  1. Lumbar stenosis with claudication 2. Spondylolisthesis, L5-S1 3. Spondylolysis L5  POSTOP DIAGNOSIS: Same  PROCEDURE: 1. L5 laminectomy with bilateral radical facetectomy (Gill Procedure) for decompression of thecal sac and exiting nerve roots, more than would be required for placement of interbody graft 2. Placement of anterior interbody device - 28mm PEEK Medtronic Capstone x2 3. Non-segmental posterior instrumentation using pedicle screws at L5-S1 - Medtronic  4. Interbody arthrodesis, L5-S1 5. Posterolateral arthrodesis, L5-S1 6. Use of locally harvested bone autograft  SURGEON: Dr. Consuella Lose, MD  ASSISTANT: Dr. Kristeen Miss, MD  ANESTHESIA: General Endotracheal  EBL: 400cc  SPECIMENS: None  DRAINS: None  COMPLICATIONS: None immediate  CONDITION: Hemodynamically stable to PACU  HISTORY: [name] [name] is a 74 y.o. male who has been followed in the outpatient clinic with back and leg pain consistent with neurogenic claudication, and spondylolisthesis. Workup has included MRI of the lumbar spine which demonstrated worst findings at L5-S1 where there is severe central stenosis, bilateral pars defects, spondylolisthesis, and foraminal stenosis. He was managed fairly well with conservative treatments, which ultimately became ineffective. He therefore elected to proceed with surgical decompression and fusion. The risks and benefits of the surgery were explained in detail to the patient and his family. After all questions were answered, informed consent was obtained.  PROCEDURE IN DETAIL: After informed consent was obtained and witnessed, the patient was brought to the operating room. After induction of general anesthesia, the patient was positioned on the operative table in the prone position. All pressure points were meticulously padded. Previous skin incision was then marked out and prepped and draped in the usual sterile fashion.  After timeout was  conducted, skin was infiltrated with local anesthetic. Skin incision was then made sharply and Bovie electrocautery was used to dissect the subcutaneous tissue until the lumbodorsal fascia was identified and incised. The muscle was then elevated in the subperiosteal plane. The L5 lamina was identified and dissected out to the L5-S1 facet complex. Position was confirmed with intraoperative fluoroscopy.  Utilizing the high-speed drill and multiple rongeurs, complete L5 laminectomy was completed. The bilateral L5 pars defects were identified, and there was significant amount of spondylotic tissue causing central compression of the thecal sac at the L5 level. The high-speed drill was used to disconnect the inferior articulating process across the L5 pars defects bilaterally, and complete facetectomy was completed with the Kerrison rongeurs. The L5 exiting nerve roots were identified and completely decompressed in the foramen. The medial portion of the superior articulating process of S1 was then removed with Kerrison rongeurs, the exiting S1 nerve roots were identified, and decompressed.  The L5-S1 disc space was then identified, and appeared to be significantly collapsed. The disc space was incised, and using a combination of disc shavers, curettes, and osteotomes, complete discectomy was completed. Endplates were then prepared with curettes. Bone harvested during the decompression was then packed into the interspace, mixed with Vitoss. 9 mm peek cages were then sized and tapped into place under fluoroscopy.  At this point, attention was turned to placement of the pedicle screws. Under lateral fluoroscopy, entry points for bilateral L5 and S1 pedicle screws were identified. These were then tapped to 6.5 mm, and 6.5 x 40 mm screws placed in L5, and 6.5 x 30 mm screws placed in S1.  At this point, dissection was carried out laterally to identify the transverse process of L5 and S1. Bony surfaces were then  decorticated with a high-speed drill. The lateral gutters  were then packed with more locally harvested bone autograft mixed with Vitoss.  A 40 mm rod was placed on the patient's right, and a 39mm rod on the left. Setscrews were then placed, and final tightened. Final AP and lateral fluoroscopy was taken to confirm good position of the hardware.  Good hemostasis was then confirmed using a combination of morcellized Gelfoam with thrombin and bipolar electrocautery. The wound is irrigated with copious amounts of bacitracin irrigation. The wound was then closed in standard fashion using a combination of interrupted 0 and 3-0 Vicryl stitches in the muscular, fascial, and subcutaneous layers. Skin was then closed using standard Dermabond. Sterile dressing was then applied. The patient was then transferred to the stretcher, extubated, and taken to the postanesthesia care unit in stable hemodynamic condition.  At the end of the case all sponge, needle, cottonoid, and instrument counts were correct.

## 2014-11-05 NOTE — Discharge Instructions (Signed)
Wound Care Keep incision covered and dry for two days.  . Remove dressing and Leave incision open to air. Do not put any creams, lotions, or ointments on incision.  Activity Walk each and every day, increasing distance each day. No lifting greater than 5 lbs.  Avoid excessive neck motion. No driving for 2 weeks; may ride as a passenger locally. If provided with back brace, wear when out of bed.  It is not necessary to wear brace in bed. Diet Resume your normal diet.  Return to Work Will be discussed at you follow up appointment. Call Your Doctor If Any of These Occur Redness, drainage, or swelling at the wound.  Temperature greater than 101 degrees. Severe pain not relieved by pain medication. Incision starts to come apart. Follow Up Appt Call today for appointment in 1-2 weeks ([phone]) or for problems.  If you have any hardware placed in your spine, you will need an x-ray before your appointment.

## 2014-11-05 NOTE — Transfer of Care (Signed)
Immediate Anesthesia Transfer of Care Note  Patient: [name] [name]  Procedure(s) Performed: Procedure(s) with comments: POSTERIOR LUMBAR FUSION 1 LEVEL  (N/A) - L5S1 decompressions with posterior lumbar interbody fusion with interbody prosthesis posterior lateral arthrodesis and posterior segmental instrumentation  Patient Location: PACU  Anesthesia Type:General  Level of Consciousness: awake, alert  and oriented  Airway & Oxygen Therapy: Patient Spontanous Breathing and Patient connected to nasal cannula oxygen  Post-op Assessment: Report given to RN, Post -op Vital signs reviewed and stable and Patient moving all extremities X 4  Post vital signs: Reviewed and stable  Last Vitals:  Filed Vitals:   11/05/14 0823  BP: 146/79  Pulse: 84  Temp: 36.4 C  Resp: 20    Complications: No apparent anesthesia complications

## 2014-11-05 NOTE — Anesthesia Preprocedure Evaluation (Addendum)
Anesthesia Evaluation  Patient identified by MRN, date of birth, ID band Patient awake    Reviewed: Allergy & Precautions, NPO status   Airway Mallampati: II  TM Distance: >3 FB Neck ROM: Full    Dental  (+) Dental Advidsory Given, Teeth Intact   Pulmonary asthma , COPDformer smoker,  breath sounds clear to auscultation        Cardiovascular hypertension, + CAD, + Past MI and + Peripheral Vascular Disease + dysrhythmias Rhythm:Regular Rate:Normal     Neuro/Psych    GI/Hepatic Neg liver ROS, GERD-  ,  Endo/Other  negative endocrine ROS  Renal/GU negative Renal ROS     Musculoskeletal   Abdominal   Peds  Hematology   Anesthesia Other Findings   Reproductive/Obstetrics                           Anesthesia Physical Anesthesia Plan  ASA: III  Anesthesia Plan: General   Post-op Pain Management:    Induction: Intravenous  Airway Management Planned: Oral ETT  Additional Equipment:   Intra-op Plan:   Post-operative Plan: Possible Post-op intubation/ventilation  Informed Consent: I have reviewed the patients History and Physical, chart, labs and discussed the procedure including the risks, benefits and alternatives for the proposed anesthesia with the patient or authorized representative who has indicated his/her understanding and acceptance.   Dental advisory given and Dental Advisory Given  Plan Discussed with: CRNA and Anesthesiologist  Anesthesia Plan Comments:        Anesthesia Quick Evaluation

## 2014-11-06 NOTE — Progress Notes (Signed)
Occupational Therapy Treatment Patient Details Name: LEEON MAKAR MRN: [id number] DOB: 1941/05/15 Today's Date: 11/06/2014    History of present illness 74 y.o. s/p L5-S1 decompressions with posterior lumbar interbody fusion with interbody prosthesis posterior lateral arthrodesis and posterior segmental instrumentation   OT comments  Education provided during session. Feel pt is safe to d/c home, from OT standpoint.   Follow Up Recommendations  No OT follow up;Supervision - Intermittent    Equipment Recommendations  None recommended by OT    Recommendations for Other Services      Precautions / Restrictions Precautions Precautions: Back;Fall Precaution Booklet Issued:  (given to pt yesterday) Precaution Comments: reviewed back precautions; pt able to verbalize 3/3  Required Braces or Orthoses: Spinal Brace Spinal Brace: Lumbar corset (already on in session; to be applied in session) Restrictions Weight Bearing Restrictions: No       Mobility Bed Mobility Did not perform-pt in chair  Transfers Overall transfer level: Needs assistance  Transfers: Sit to/from Stand Sit to Stand: Min assist         General transfer comment: assist for balance. Pt reaching for therapist.     Balance    Assist for balance upon standing-Min assist.                               ADL Overall ADL's : Needs assistance/impaired                       Lower Body Dressing Details (indicate cue type and reason): pt having difficulty crossing leg over knee Toilet Transfer: Min guard;Minimal assistance;Ambulation (chair; Min assist for balance upon standing)           Functional mobility during ADLs: Min guard General ADL Comments: Reviewed/educated on incorporating precautions into functional activities (use of cup for oral care, placement of grooming items, not reaching above shoulder level). Educated on safety such as sitting for LB bathing, rugs/items on floor,  and recommended someone be with him for shower transfer. Pt verbalizes he knows how to don back brace. Pt asking sitting in recliner versus other chair-OT discussed. Educated on AE and where it could be purchased-pt did not want to practice. Educated on LB dressing technique. Discussed shower transfer technique.       Vision                     Perception     Praxis      Cognition  Awake/Alert Behavior During Therapy: WFL for tasks assessed/performed Overall Cognitive Status: Within Functional Limits for tasks assessed                       Extremity/Trunk Assessment  Upper Extremity Assessment Upper Extremity Assessment: Defer to OT evaluation   Lower Extremity Assessment Lower Extremity Assessment: LLE deficits/detail LLE Deficits / Details: Residual weakness consistent with symptoms prior to surgery.    Cervical / Trunk Assessment Cervical / Trunk Assessment: Normal    Exercises     Shoulder Instructions       General Comments      Pertinent Vitals/ Pain       Pain Assessment: 0-10 Pain Score:  (4 sitting and 8 moving) Pain Location: back Pain Descriptors / Indicators: Burning;Throbbing Pain Intervention(s): Monitored during session  Home Living Family/patient expects to be discharged to:: Private residence Living Arrangements: Spouse/significant other Available Help at Discharge: Family;Available  24 hours/day Type of Home: House Home Access: Stairs to enter [employer] of Steps: 2 Entrance Stairs-Rails: None Home Layout: Two level;Able to live on main level with bedroom/bathroom               Home Equipment: Gilford Rile - 2 wheels;Walker - 4 wheels;Shower seat;Shower seat - built in;Bedside commode          Prior Functioning/Environment Level of Independence: Independent            Frequency Min 2X/week     Progress Toward Goals  OT Goals(current goals can now be found in the care plan section)  Progress towards  OT goals: Progressing toward goals  Acute Rehab OT Goals Patient Stated Goal: ready to go home OT Goal Formulation: With patient Time For Goal Achievement: 11/12/14 Potential to Achieve Goals: Good ADL Goals Pt Will Perform Grooming: with set-up;standing Pt Will Perform Lower Body Dressing: with set-up;sit to/from stand Pt Will Transfer to Toilet: with supervision;ambulating (elevated toilet) Pt Will Perform Toileting - Clothing Manipulation and hygiene: with modified independence;sit to/from stand Additional ADL Goal #1: Pt will independently verbalize 3/3 back precautions and maintain during session. Additional ADL Goal #2: Pt will don/doff back brace with setup assist.  Plan Discharge plan remains appropriate    Co-evaluation                 End of Session Equipment Utilized During Treatment: Gait belt;Back brace   Activity Tolerance Patient tolerated treatment well   Patient Left in chair;with family/visitor present   Nurse Communication Other (comment) (ready for d/c)        Time: 6301-6010 OT Time Calculation (min): 15 min  Charges: OT General Charges $OT Visit: 1 Procedure OT Treatments $Self Care/Home Management : 8-22 mins  [name] OTR/L [phone] 11/06/2014, 9:53 AM

## 2014-11-06 NOTE — Progress Notes (Signed)
Patient alert and oriented, mae's well, voiding adequate amount of urine, swallowing without difficulty, c/o moderate pain and meds given prior to discharged. Patient discharged home with family. Script and discharged instructions given to patient. Patient and family stated understanding of instructions given.  

## 2014-11-06 NOTE — Evaluation (Signed)
Physical Therapy Evaluation Patient Details Name: [name] [name] MRN: [id number] DOB: Aug 26, 1940 Today's Date: 11/06/2014   History of Present Illness  74 y.o. s/p L5-S1 decompressions with posterior lumbar interbody fusion with interbody prosthesis posterior lateral arthrodesis and posterior segmental instrumentation  Clinical Impression  Pt admitted with above diagnosis. Pt currently with functional limitations due to the deficits listed below (see PT Problem List). At the time of PT eval pt was able to perform transfers at a mod I level and ambulation/stairs at a min guard assist level. Recommending use of SPC for longer distances/community ambulation. Pt will benefit from skilled PT to increase their independence and safety with mobility to allow discharge to the venue listed below.       Follow Up Recommendations Outpatient PT (When appropriate per post-op protocol)    Equipment Recommendations  Cane    Recommendations for Other Services       Precautions / Restrictions Precautions Precautions: Back;Fall Precaution Comments: educated on back precautions. Pt able to recall 3/3 without cueing Required Braces or Orthoses: Spinal Brace Spinal Brace:  (already on in session) Restrictions Weight Bearing Restrictions: No      Mobility  Bed Mobility Overal bed mobility: Modified Independent Bed Mobility: Rolling;Sit to Sidelying;Sidelying to Sit           General bed mobility comments: VC's for technique. Pt did not require assist and demonstrated proper safety awareness with precautions.   Transfers Overall transfer level: Modified independent Equipment used: None Transfers: Sit to/from Stand           General transfer comment: No physical assist required.   Ambulation/Gait Ambulation/Gait assistance: Min guard Ambulation Distance (Feet): 250 Feet Assistive device: None Gait Pattern/deviations: Step-through pattern;Decreased stride length Gait velocity:  Decreased Gait velocity interpretation: Below normal speed for age/gender General Gait Details: Occasional hands-on support provided for unsteadiness. Discussed the benefits of a cane for community ambulation and pt agreeable.   Stairs Stairs: Yes Stairs assistance: Min guard Stair Management: One rail Right;Backwards;Step to pattern (support on wall to simulate home environment) Number of Stairs: 3 General stair comments: Min guard for safety. Pt was able to negotiate 3 stairs without LOB. Pt was cued for ascending with the RLE and descending with the LLE.   Wheelchair Mobility    Modified Rankin (Stroke Patients Only)       Balance                                             Pertinent Vitals/Pain Pain Assessment: Faces Faces Pain Scale: Hurts a little bit Pain Location: Incisional pain Pain Descriptors / Indicators: Operative site guarding Pain Intervention(s): Limited activity within patient's tolerance;Monitored during session;Repositioned    Home Living Family/patient expects to be discharged to:: Private residence Living Arrangements: Spouse/significant other Available Help at Discharge: Family;Available 24 hours/day Type of Home: House Home Access: Stairs to enter Entrance Stairs-Rails: None Entrance Stairs-Number of Steps: 2 Home Layout: Two level;Able to live on main level with bedroom/bathroom Home Equipment: Gilford Rile - 2 wheels;Walker - 4 wheels;Shower seat;Shower seat - built in;Bedside commode      Prior Function Level of Independence: Independent               Hand Dominance   Dominant Hand: Right    Extremity/Trunk Assessment   Upper Extremity Assessment: Defer to OT evaluation  Lower Extremity Assessment: LLE deficits/detail   LLE Deficits / Details: Residual weakness consistent with symptoms prior to surgery.   Cervical / Trunk Assessment: Normal  Communication   Communication: HOH  Cognition  Arousal/Alertness: Awake/alert Behavior During Therapy: WFL for tasks assessed/performed Overall Cognitive Status: Within Functional Limits for tasks assessed                      General Comments      Exercises        Assessment/Plan    PT Assessment Patient needs continued PT services  PT Diagnosis Difficulty walking;Acute pain   PT Problem List Decreased strength;Decreased range of motion;Decreased activity tolerance;Decreased balance;Decreased mobility;Decreased knowledge of use of DME;Decreased safety awareness;Decreased knowledge of precautions;Pain  PT Treatment Interventions Gait training;DME instruction;Stair training;Functional mobility training;Therapeutic activities;Therapeutic exercise;Neuromuscular re-education;Patient/family education   PT Goals (Current goals can be found in the Care Plan section) Acute Rehab PT Goals Patient Stated Goal: go home PT Goal Formulation: With patient/family Time For Goal Achievement: 11/13/14 Potential to Achieve Goals: Good    Frequency Min 5X/week   Barriers to discharge        Co-evaluation               End of Session Equipment Utilized During Treatment: Back brace Activity Tolerance: Patient tolerated treatment well Patient left: in chair;with family/visitor present Nurse Communication: Mobility status         Time: 2992-4268 PT Time Calculation (min) (ACUTE ONLY): 30 min   Charges:   PT Evaluation $Initial PT Evaluation Tier I: 1 Procedure PT Treatments $Gait Training: 8-22 mins   PT G Codes:        Rolinda Roan 11/15/14, 9:15 AM   Rolinda Roan, PT, DPT Acute Rehabilitation Services Pager: [phone]

## 2014-11-06 NOTE — Progress Notes (Signed)
Utilization review completed.  

## 2014-11-08 NOTE — Discharge Summary (Signed)
Physician Discharge Summary  Patient ID: [name] [name] MRN: [id number] DOB/AGE: 1940/10/14 74 y.o.  Admit date: 11/05/2014 Discharge date: 11/08/2014  Admission Diagnoses: Lumbar spondylosis  Discharge Diagnoses: Same Active Problems:   Lumbar spondylosis   Discharged Condition: Stable  Hospital Course:  [name] is a 74 y.o. male electively admitted after uncomplicated Z0-C5 PLIF. He reported improvement in his back and leg pain postop, was ambulating well, tolerating diet, and voiding normally.  Treatments: Surgery - PLIF L5-S1  Discharge Exam: Blood pressure 116/75, pulse 83, temperature 98.5 F (36.9 C), temperature source Oral, resp. rate 20, height 6\' 1"  (1.854 m), weight 92.08 kg (203 lb), SpO2 95 %. Awake, alert, oriented Speech fluent, appropriate CN grossly intact 5/5 BUE/BLE Wound c/d/i  Follow-up: Follow-up in my office [facility] [phone]) in 2-3 weeks  Disposition: 01-Home or Self Care     Medication List    STOP taking these medications        ibuprofen 200 MG tablet  Commonly known as:  ADVIL,MOTRIN      TAKE these medications        albuterol 108 (90 BASE) MCG/ACT inhaler  Commonly known as:  PROVENTIL HFA;VENTOLIN HFA  Inhale 2 puffs into the lungs every 6 (six) hours as needed for wheezing.     albuterol (2.5 MG/3ML) 0.083% nebulizer solution  Commonly known as:  PROVENTIL  INHALE 1 VIAL VIA NEBULIZER FOUR TIMES DAILY.     carvedilol 6.25 MG tablet  Commonly known as:  COREG  Take 1 tablet (6.25 mg total) by mouth 2 (two) times daily.     cetirizine 10 MG tablet  Commonly known as:  ZYRTEC  Take 10 mg by mouth daily.     diazepam 5 MG tablet  Commonly known as:  VALIUM  Take 1 tablet (5 mg total) by mouth every 6 (six) hours as needed for muscle spasms.     esomeprazole 40 MG capsule  Commonly known as:  NEXIUM  Take 40 mg by mouth daily before breakfast.     lovastatin 40 MG tablet  Commonly  known as:  MEVACOR  TAKE 2 TABLETS BY MOUTH DAILY.     montelukast 10 MG tablet  Commonly known as:  SINGULAIR  TAKE ONE TABLET BY MOUTH AT BEDTIME AS NEEDED FOR ALLERGIES.     MULTIVITAMIN & MINERAL PO  Take 1 tablet by mouth daily.     nitroGLYCERIN 0.4 MG SL tablet  Commonly known as:  NITROSTAT  Place 1 tablet (0.4 mg total) under the tongue every 5 (five) minutes as needed.     OMEGA-3 FISH OIL PO  Take 1 tablet by mouth daily.     oxyCODONE-acetaminophen 10-325 MG per tablet  Commonly known as:  PERCOCET  Take 1 tablet by mouth every 4 (four) hours as needed for pain.     Probiotic Caps  Take 1 capsule by mouth daily.     ranolazine 500 MG 12 hr tablet  Commonly known as:  RANEXA  Take 1 tablet (500 mg total) by mouth 2 (two) times daily.     Sulfacetamide Sodium-Sulfur 10-5 % Foam  Apply 1 application topically daily as needed (rosazea).     traMADol 50 MG tablet  Commonly known as:  ULTRAM  Take 1 tablet (50 mg total) by mouth 2 (two) times daily as needed.           Follow-up Information    Follow up with Daelan Gatt, C,  MD In 3 weeks.   Specialty:  Neurosurgery   Contact information:   [address] [place] [address] [phone]       Signed: Jairo Ben 11/08/2014, 11:53 AM

## 2014-11-11 ENCOUNTER — Emergency Department (HOSPITAL_COMMUNITY)
Admission: EM | Admit: 2014-11-11 | Discharge: 2014-11-11 | Disposition: A | Discharge status: Home / Self Care | Payer: Medicare Other | Attending: Emergency Medicine | Admitting: Emergency Medicine

## 2014-11-11 ENCOUNTER — Encounter (HOSPITAL_COMMUNITY): Payer: Self-pay | Admitting: *Deleted

## 2014-11-11 ENCOUNTER — Emergency Department (HOSPITAL_COMMUNITY): Payer: Medicare Other

## 2014-11-11 DIAGNOSIS — Z880 Allergy status to penicillin: Secondary | ICD-10-CM | POA: Diagnosis not present

## 2014-11-11 DIAGNOSIS — I252 Old myocardial infarction: Secondary | ICD-10-CM | POA: Insufficient documentation

## 2014-11-11 DIAGNOSIS — J441 Chronic obstructive pulmonary disease with (acute) exacerbation: Secondary | ICD-10-CM | POA: Diagnosis present

## 2014-11-11 DIAGNOSIS — J449 Chronic obstructive pulmonary disease, unspecified: Secondary | ICD-10-CM | POA: Diagnosis not present

## 2014-11-11 DIAGNOSIS — H9190 Unspecified hearing loss, unspecified ear: Secondary | ICD-10-CM | POA: Insufficient documentation

## 2014-11-11 DIAGNOSIS — B370 Candidal stomatitis: Secondary | ICD-10-CM | POA: Diagnosis not present

## 2014-11-11 DIAGNOSIS — Z79899 Other long term (current) drug therapy: Secondary | ICD-10-CM | POA: Diagnosis not present

## 2014-11-11 DIAGNOSIS — J410 Simple chronic bronchitis: Secondary | ICD-10-CM

## 2014-11-11 DIAGNOSIS — Z9889 Other specified postprocedural states: Secondary | ICD-10-CM | POA: Diagnosis not present

## 2014-11-11 DIAGNOSIS — I2510 Atherosclerotic heart disease of native coronary artery without angina pectoris: Secondary | ICD-10-CM | POA: Insufficient documentation

## 2014-11-11 DIAGNOSIS — Z792 Long term (current) use of antibiotics: Secondary | ICD-10-CM | POA: Insufficient documentation

## 2014-11-11 DIAGNOSIS — M1990 Unspecified osteoarthritis, unspecified site: Secondary | ICD-10-CM | POA: Insufficient documentation

## 2014-11-11 DIAGNOSIS — R3919 Other difficulties with micturition: Secondary | ICD-10-CM | POA: Diagnosis not present

## 2014-11-11 DIAGNOSIS — E780 Pure hypercholesterolemia: Secondary | ICD-10-CM | POA: Insufficient documentation

## 2014-11-11 DIAGNOSIS — B379 Candidiasis, unspecified: Secondary | ICD-10-CM | POA: Diagnosis not present

## 2014-11-11 DIAGNOSIS — N390 Urinary tract infection, site not specified: Secondary | ICD-10-CM

## 2014-11-11 DIAGNOSIS — Z87891 Personal history of nicotine dependence: Secondary | ICD-10-CM | POA: Diagnosis not present

## 2014-11-11 DIAGNOSIS — I10 Essential (primary) hypertension: Secondary | ICD-10-CM | POA: Diagnosis not present

## 2014-11-11 DIAGNOSIS — R102 Pelvic and perineal pain: Secondary | ICD-10-CM | POA: Diagnosis present

## 2014-11-11 DIAGNOSIS — K219 Gastro-esophageal reflux disease without esophagitis: Secondary | ICD-10-CM | POA: Insufficient documentation

## 2014-11-11 DIAGNOSIS — R52 Pain, unspecified: Secondary | ICD-10-CM | POA: Diagnosis not present

## 2014-11-11 DIAGNOSIS — M47816 Spondylosis without myelopathy or radiculopathy, lumbar region: Secondary | ICD-10-CM

## 2014-11-11 DIAGNOSIS — Z85828 Personal history of other malignant neoplasm of skin: Secondary | ICD-10-CM | POA: Insufficient documentation

## 2014-11-11 DIAGNOSIS — N400 Enlarged prostate without lower urinary tract symptoms: Secondary | ICD-10-CM | POA: Diagnosis not present

## 2014-11-11 DIAGNOSIS — Z862 Personal history of diseases of the blood and blood-forming organs and certain disorders involving the immune mechanism: Secondary | ICD-10-CM | POA: Insufficient documentation

## 2014-11-11 DIAGNOSIS — B3781 Candidal esophagitis: Secondary | ICD-10-CM

## 2014-11-11 DIAGNOSIS — R339 Retention of urine, unspecified: Secondary | ICD-10-CM | POA: Diagnosis not present

## 2014-11-11 DIAGNOSIS — R32 Unspecified urinary incontinence: Secondary | ICD-10-CM | POA: Diagnosis not present

## 2014-11-11 HISTORY — DX: Low back pain, unspecified: M54.50

## 2014-11-11 HISTORY — DX: Candidal stomatitis: B37.0

## 2014-11-11 HISTORY — DX: Low back pain: M54.5

## 2014-11-11 LAB — CBC WITH DIFFERENTIAL/PLATELET
Basophils Absolute: 0 10*3/uL (ref 0.0–0.1)
Basophils Relative: 0 % (ref 0–1)
Eosinophils Absolute: 0.3 10*3/uL (ref 0.0–0.7)
Eosinophils Relative: 3 % (ref 0–5)
HCT: 33.5 % — ABNORMAL LOW (ref 39.0–52.0)
Hemoglobin: 10.7 g/dL — ABNORMAL LOW (ref 13.0–17.0)
Lymphocytes Relative: 11 % — ABNORMAL LOW (ref 12–46)
Lymphs Abs: 1 10*3/uL (ref 0.7–4.0)
MCH: 28.2 pg (ref 26.0–34.0)
MCHC: 31.9 g/dL (ref 30.0–36.0)
MCV: 88.4 fL (ref 78.0–100.0)
Monocytes Absolute: 0.9 10*3/uL (ref 0.1–1.0)
Monocytes Relative: 9 % (ref 3–12)
Neutro Abs: 7.3 10*3/uL (ref 1.7–7.7)
Neutrophils Relative %: 77 % (ref 43–77)
Platelets: 250 10*3/uL (ref 150–400)
RBC: 3.79 MIL/uL — ABNORMAL LOW (ref 4.22–5.81)
RDW: 14.9 % (ref 11.5–15.5)
WBC: 9.4 10*3/uL (ref 4.0–10.5)

## 2014-11-11 LAB — URINALYSIS, ROUTINE W REFLEX MICROSCOPIC
Bilirubin Urine: NEGATIVE
Glucose, UA: NEGATIVE mg/dL
Ketones, ur: NEGATIVE mg/dL
Nitrite: POSITIVE — AB
Protein, ur: NEGATIVE mg/dL
Specific Gravity, Urine: 1.01 (ref 1.005–1.030)
Urobilinogen, UA: 1 mg/dL (ref 0.0–1.0)
pH: 7.5 (ref 5.0–8.0)

## 2014-11-11 LAB — COMPREHENSIVE METABOLIC PANEL
ALT: 41 U/L (ref 17–63)
AST: 30 U/L (ref 15–41)
Albumin: 3.1 g/dL — ABNORMAL LOW (ref 3.5–5.0)
Alkaline Phosphatase: 78 U/L (ref 38–126)
Anion gap: 8 (ref 5–15)
BUN: 13 mg/dL (ref 6–20)
CO2: 27 mmol/L (ref 22–32)
Calcium: 8.4 mg/dL — ABNORMAL LOW (ref 8.9–10.3)
Chloride: 96 mmol/L — ABNORMAL LOW (ref 101–111)
Creatinine, Ser: 0.92 mg/dL (ref 0.61–1.24)
GFR calc Af Amer: >60 mL/min (ref >60)
GFR calc non Af Amer: >60 mL/min (ref >60)
Glucose, Bld: 113 mg/dL — ABNORMAL HIGH (ref 65–99)
Potassium: 4 mmol/L (ref 3.5–5.1)
Sodium: 131 mmol/L — ABNORMAL LOW (ref 135–145)
Total Bilirubin: 0.5 mg/dL (ref 0.3–1.2)
Total Protein: 6.9 g/dL (ref 6.5–8.1)

## 2014-11-11 LAB — URINE MICROSCOPIC-ADD ON

## 2014-11-11 LAB — LIPASE, BLOOD: Lipase: 26 U/L (ref 22–51)

## 2014-11-11 LAB — LACTIC ACID, PLASMA: Lactic Acid, Venous: 1.2 mmol/L (ref 0.5–2.0)

## 2014-11-11 MED ORDER — MORPHINE SULFATE 4 MG/ML IJ SOLN
4.0000 mg | INTRAMUSCULAR | Status: DC | PRN
  Administered 2014-11-11: 4 mg via INTRAVENOUS
  Filled 2014-11-11: qty 1

## 2014-11-11 MED ORDER — DEXTROSE 5 % IV SOLN
1.0000 g | Freq: Once | INTRAVENOUS | Status: AC
  Administered 2014-11-11: 1 g via INTRAVENOUS
  Filled 2014-11-11: qty 10

## 2014-11-11 MED ORDER — SODIUM CHLORIDE 0.9 % IV SOLN
INTRAVENOUS | Status: DC
  Administered 2014-11-11: 75 mL/h via INTRAVENOUS

## 2014-11-11 MED ORDER — LIDOCAINE VISCOUS 2 % MT SOLN
5.0000 mL | Freq: Once | OROMUCOSAL | Status: AC
  Administered 2014-11-11: 5 mL via OROMUCOSAL
  Filled 2014-11-11: qty 15

## 2014-11-11 MED ORDER — CEPHALEXIN 500 MG PO CAPS: 500.0000 mg | ORAL_CAPSULE | Freq: Four times a day (QID) | ORAL | Status: DC

## 2014-11-11 MED ORDER — MAGIC MOUTHWASH W/LIDOCAINE: 10.0000 mL | Freq: Once | ORAL | Status: DC

## 2014-11-11 MED ORDER — MAGIC MOUTHWASH
ORAL | Status: AC
  Filled 2014-11-11: qty 5

## 2014-11-11 MED ORDER — MAGIC MOUTHWASH: 5.0000 mL | Freq: Four times a day (QID) | ORAL | Status: DC | PRN

## 2014-11-11 MED ORDER — MAGIC MOUTHWASH
5.0000 mL | Freq: Once | ORAL | Status: AC
  Administered 2014-11-11: 5 mL via ORAL
  Filled 2014-11-11: qty 5

## 2014-11-11 MED ORDER — ONDANSETRON HCL 4 MG/2ML IJ SOLN
4.0000 mg | INTRAMUSCULAR | Status: DC | PRN
  Administered 2014-11-11: 4 mg via INTRAVENOUS
  Filled 2014-11-11: qty 2

## 2014-11-11 MED ORDER — PHENAZOPYRIDINE HCL 100 MG PO TABS: 100.0000 mg | ORAL_TABLET | Freq: Three times a day (TID) | ORAL | Status: DC

## 2014-11-11 MED ORDER — PHENAZOPYRIDINE HCL 100 MG PO TABS
200.0000 mg | ORAL_TABLET | Freq: Once | ORAL | Status: AC
  Administered 2014-11-11: 200 mg via ORAL
  Filled 2014-11-11: qty 2

## 2014-11-11 NOTE — Consult Note (Addendum)
[facility] Consult Note  [name] [name] [name] [name] DOB: 08-Mar-1941 DOA: 11/11/2014  Referring physician: Dr Thurnell Garbe - [facility] PCP: Mickie Hillier, MD   Chief Complaint: UTI  HPI: [name] [name] is a 74 y.o. male  Patient complaining of suprapubic pain and dysuria over the last 6 days. Patient states this is associated with a difficulty fully emptying his bladder. Some relief with Flomax intermittently since onset of symptoms. Of note patient had back surgery little over week ago. At that time patient stated that the insertion and removal of the catheter caused pain and this was the onset of his dysuria. Patient called the surgeon and was started on Bactrim 1-1/2 days ago for a presumed UTI. Denies back pain, fevers, nausea, vomiting, chest pain, shortness of breath, palpitations, diarrhea. Some constipation from back surgery and opioid use.   Review of Systems:  Constitutional:  No weight loss, night sweats, Fevers, chills, fatigue.  HEENT:  No headaches, Difficulty swallowing,Tooth/dental problems,Sore throat,  No sneezing, itching, ear ache, nasal congestion, post nasal drip,  Cardio-vascular:  No chest pain, Orthopnea, PND, swelling in lower extremities, anasarca, dizziness, palpitations  GI:  No heartburn, indigestion, abdominal pain, nausea, vomiting, diarrhea, change in bowel habits, Resp:  No shortness of breath with exertion or at rest. No excess mucus, no productive cough, No non-productive cough, No coughing up of blood.No change in color of mucus.No wheezing.No chest wall deformity  Skin:  no rash or lesions.  GU:  Per HPI Psych:  No change in mood or affect. No depression or anxiety. No memory loss.   Past Medical History  Diagnosis Date  . AAA (abdominal aortic aneurysm)     needs yearly ultrasound  . Hypertension   . COPD (chronic obstructive pulmonary disease)   . Hypercholesterolemia   . MI (myocardial infarction) 1999  . BPH (benign prostatic hyperplasia)    . GERD (gastroesophageal reflux disease)   . Arthritis   . Glaucoma   . Impaired fasting glucose   . Allergy   . Asthma   . CAD (coronary artery disease)   . HOH (hard of hearing)   . Cancer     skin cancer  . Anemia   . Dysrhythmia     pt. states it can be fast at times  . Low back pain    Past Surgical History  Procedure Laterality Date  . Cardiac catheterization      angioplasty  . Back surgery      x 3  . Right eye detached retina Bilateral   . Cholecystectomy  2000  . Cataract extraction w/phaco  03/20/2012    Procedure: CATARACT EXTRACTION PHACO AND INTRAOCULAR LENS PLACEMENT (IOC);  Surgeon: Williams Che, MD;  Location: [facility];  Service: Ophthalmology;  Laterality: Right;  CDE:  8.45  . Colonoscopy  2009    repeat 5 years  . Cataract extraction w/phaco Left 04/02/2013    Procedure: CATARACT EXTRACTION PHACO AND INTRAOCULAR LENS PLACEMENT (IOC);  Surgeon: Williams Che, MD;  Location: [facility];  Service: Ophthalmology;  Laterality: Left;  CDE:  6.50  . Laparoscopic partial colectomy N/A 06/11/2013    Procedure: LAPAROSCOPIC HAND ASSISTED PARTIAL COLECTOMY;  Surgeon: Jamesetta So, MD;  Location: [facility];  Service: General;  Laterality: N/A;  . Yag laser application Left 81/19/1478    Procedure: YAG LASER APPLICATION;  Surgeon: Williams Che, MD;  Location: [facility];  Service: Ophthalmology;  Laterality: Left;  . Esophagogastroduodenoscopy    . Hernia  repair Left     inguinal   Social History:  reports that he quit smoking about 11 years ago. His smoking use included Cigarettes. He has a 35 pack-year smoking history. He does not have any smokeless tobacco history on file. He reports that he does not drink alcohol or use illicit drugs.  Allergies  Allergen Reactions  . Cefzil [Cefprozil] Nausea Only  . Dexamethasone Swelling  . Methocarbamol Swelling  . Neomycin Other (See Comments)    Patient can't remember.  . Tetracyclines & Related Itching  . Ciprofloxacin  Nausea And Vomiting and Rash  . Penicillins Rash    Family History  Problem Relation Age of Onset  . Hypertension Mother   . COPD Father   . Cancer Brother     brain     Prior to Admission medications   Medication Sig Start Date End Date Taking? Authorizing Provider  albuterol (PROVENTIL HFA;VENTOLIN HFA) 108 (90 BASE) MCG/ACT inhaler Inhale 2 puffs into the lungs every 6 (six) hours as needed for wheezing. 01/30/14  Yes Mikey Kirschner, MD  albuterol (PROVENTIL) (2.5 MG/3ML) 0.083% nebulizer solution INHALE 1 VIAL VIA NEBULIZER FOUR TIMES DAILY. Patient taking differently: INHALE 1 VIAL VIA NEBULIZER ONE TO TWO TIMES DAILY AS NEEDED 10/24/14  Yes Mikey Kirschner, MD  cetirizine (ZYRTEC) 10 MG tablet Take 10 mg by mouth daily.   Yes Historical Provider, MD  Cranberry-Vitamin C-Probiotic (AZO CRANBERRY PO) Take 1 tablet by mouth daily.   Yes Historical Provider, MD  diazepam (VALIUM) 5 MG tablet Take 1 tablet (5 mg total) by mouth every 6 (six) hours as needed for muscle spasms. 11/05/14  Yes Consuella Lose, MD  docusate sodium (COLACE) 100 MG capsule Take 100 mg by mouth 2 (two) times daily.   Yes Historical Provider, MD  esomeprazole (NEXIUM) 40 MG capsule Take 40 mg by mouth daily before breakfast.     Yes Historical Provider, MD  lovastatin (MEVACOR) 40 MG tablet TAKE 2 TABLETS BY MOUTH DAILY. Patient taking differently: TAKE 1 TABLET BY MOUTH DAILY. 07/31/14  Yes Mikey Kirschner, MD  Multiple Vitamins-Minerals (MULTIVITAMIN & MINERAL PO) Take 1 tablet by mouth daily.   Yes Historical Provider, MD  nitroGLYCERIN (NITROSTAT) 0.4 MG SL tablet Place 1 tablet (0.4 mg total) under the tongue every 5 (five) minutes as needed. 11/04/10  Yes Josue Hector, MD  Omega-3 Fatty Acids (OMEGA-3 FISH OIL PO) Take 1 tablet by mouth daily.   Yes Historical Provider, MD  oxyCODONE-acetaminophen (PERCOCET) 10-325 MG per tablet Take 1 tablet by mouth every 4 (four) hours as needed for pain. 11/05/14  Yes  Consuella Lose, MD  Probiotic CAPS Take 1 capsule by mouth daily.   Yes Historical Provider, MD  Sulfacetamide Sodium-Sulfur 10-5 % FOAM Apply 1 application topically daily as needed (rosazea).   Yes Historical Provider, MD  sulfamethoxazole-trimethoprim (BACTRIM DS,SEPTRA DS) 800-160 MG per tablet Take 1 tablet by mouth 2 (two) times daily.  11/09/14  Yes Historical Provider, MD  tamsulosin (FLOMAX) 0.4 MG CAPS capsule Take 0.4 mg by mouth daily.  09/25/14  Yes Historical Provider, MD  traMADol (ULTRAM) 50 MG tablet Take 1 tablet (50 mg total) by mouth 2 (two) times daily as needed. 07/04/14  Yes Josue Hector, MD  carvedilol (COREG) 6.25 MG tablet Take 1 tablet (6.25 mg total) by mouth 2 (two) times daily. Patient not taking: Reported on 11/11/2014 10/21/14   Josue Hector, MD  montelukast (SINGULAIR) 10 MG tablet TAKE  ONE TABLET BY MOUTH AT BEDTIME AS NEEDED FOR ALLERGIES. Patient not taking: Reported on 11/11/2014 07/22/14   Mikey Kirschner, MD  ranolazine (RANEXA) 500 MG 12 hr tablet Take 1 tablet (500 mg total) by mouth 2 (two) times daily. Patient not taking: Reported on 11/11/2014 10/28/14   Josue Hector, MD   Physical Exam: Filed Vitals:   11/11/14 1402 11/11/14 1404 11/11/14 1626 11/11/14 1837  BP:  123/75 123/75 115/75  Pulse: 95  91 96  Temp: 98.3 F (36.8 C)  98.1 F (36.7 C) 98.1 F (36.7 C)  TempSrc: Oral  Oral Oral  Resp: 16  16 16   Height: 6\' 1"  (1.854 m)     Weight: 92.08 kg (203 lb)     SpO2: 99%  94% 90%    Wt Readings from Last 3 Encounters:  11/11/14 92.08 kg (203 lb)  11/05/14 92.08 kg (203 lb)  10/30/14 92.488 kg (203 lb 14.4 oz)    General: Appears calm and comfortable Eyes:  PERRL, normal lids, irises & conjunctiva ENT:  grossly normal hearing, lips & tongue Cardiovascular: RRR,  Telemetry:  SR, no arrhythmias  Respiratory: Normal respiratory effort. Abdomen:  Non-distended Skin:  no rash or induration seen on limited exam Musculoskeletal:  grossly  normal tone BUE/BLE Psychiatric:  grossly normal mood and affect, speech fluent and appropriate Neurologic:  grossly non-focal.          Labs on Admission:  Basic Metabolic Panel:  Recent Labs Lab 11/05/14 1640 11/11/14 1621  NA  --  131*  K  --  4.0  CL  --  96*  CO2  --  27  GLUCOSE  --  113*  BUN  --  13  CREATININE 0.91 0.92  CALCIUM  --  8.4*   Liver Function Tests:  Recent Labs Lab 11/11/14 1621  AST 30  ALT 41  ALKPHOS 78  BILITOT 0.5  PROT 6.9  ALBUMIN 3.1*    Recent Labs Lab 11/11/14 1621  LIPASE 26   No results for input(s): AMMONIA in the last 168 hours. CBC:  Recent Labs Lab 11/05/14 1640 11/11/14 1621  WBC 18.6* 9.4  NEUTROABS  --  7.3  HGB 12.3* 10.7*  HCT 37.1* 33.5*  MCV 86.9 88.4  PLT 178 250   Cardiac Enzymes: No results for input(s): CKTOTAL, CKMB, CKMBINDEX, TROPONINI in the last 168 hours.  BNP (last 3 results) No results for input(s): BNP in the last 8760 hours.  ProBNP (last 3 results) No results for input(s): PROBNP in the last 8760 hours.  CBG: No results for input(s): GLUCAP in the last 168 hours.  Radiological Exams on Admission: Dg Abd 1 View  11/11/2014   CLINICAL DATA:  Difficulty urinating with burning and pelvic pressure for 6 days.  EXAM: ABDOMEN - 1 VIEW  COMPARISON:  None.  FINDINGS: The bowel gas pattern is nonobstructive. Prominent stool burden in the transverse colon is noted. No unexpected abdominal calcification is seen. The patient is status post lower lumbar fusion.  IMPRESSION: No acute abnormality.   Electronically Signed   By: Inge Rise M.D.   On: 11/11/2014 17:46     Assessment/Plan Principal Problem:   UTI (lower urinary tract infection) Active Problems:   COPD (chronic obstructive pulmonary disease)   Lumbar spondylosis   Thrush of mouth and esophagus  Page to evaluate patient for possible admission. Of note patient is presenting with a UTI status post instrumentation from back  surgery one week ago.  Previous to this patient had been on Bactrim for 4 weeks due to a prostatitis diagnoses. Suspect there is some decreased utilization of use of Bactrim at this point time due to possible resistance. No previous cultures by which to base therapy on. Patient has received ceftriaxone in the ED.   Recommend patient to continue Bactrim and start Keflex 500 mg 4 times a day and to call PCP in the morning for follow-up. Additionally recommended patient increase his Flomax from 0.4 to 0.8 mg. I will order a dose of Pyridium for the patient here in the ED and he may continue this as an outpatient for his dysuria. Of note patient is without systemic symptoms which would be concerning for pyelonephritis or urosepsis. Urine CUlture sent  Patient also with thrush of the mouth and esophagus. Recommending Magic mouthwash. Patient has had this previously and is easily treated with Magic mouthwash.  If there is concern for urinary retention the pt can go home with the foley in place with a leg bag and follow up with urology    Disposition Plan: Home  MERRELL, DAVID J, MD Family Medicine [facility] [email or web address] Password [email or web address]

## 2014-11-11 NOTE — ED Provider Notes (Signed)
CSN: [id number]     Arrival date & time 11/11/14  1347 History   First MD Initiated Contact with Patient 11/11/14 1558     Chief Complaint  Patient presents with  . Pelvic Pain     HPI Pt was seen at 1600. Per pt and his family, c/o gradual onset and worsening of persistent suprapubic abd "pain" and dysuria for the past 6 days. Pt also states he "feels like I'm not emptying my bladder all the way." Pt states his symptoms began "after they took out my foley catheter after my back surgery" last week. Pt states he was told that "it would be better in a few days" but it has not improved. Pt called his Neurosurgeon on 2 days ago and was rx bactrim. Pt states he has been taking the bactrim without improvement. Pt states he also took a course of bactrim before his surgery "because my family doctor said I had a UTI." Pt endorses hx of "a big prostate." Pt also c/o constipation for the past 6 days. States he has regularly been taking percocet for pain since his surgery without stool softener. Denies diarrhea, no N/V, no fevers, no CP/SOB, no incont of bowel or bladder, no focal motor weakness, no tinging/numbness in extremities, no incont of bowel/bladder, no testicular pain/swelling, no perineal pain.    Past Medical History  Diagnosis Date  . AAA (abdominal aortic aneurysm)     needs yearly ultrasound  . Hypertension   . COPD (chronic obstructive pulmonary disease)   . Hypercholesterolemia   . MI (myocardial infarction) 1999  . BPH (benign prostatic hyperplasia)   . GERD (gastroesophageal reflux disease)   . Arthritis   . Glaucoma   . Impaired fasting glucose   . Allergy   . Asthma   . CAD (coronary artery disease)   . HOH (hard of hearing)   . Cancer     skin cancer  . Anemia   . Dysrhythmia     pt. states it can be fast at times  . Low back pain   . Thrush    Past Surgical History  Procedure Laterality Date  . Cardiac catheterization      angioplasty  . Back surgery      x 3  .  Right eye detached retina Bilateral   . Cholecystectomy  2000  . Cataract extraction w/phaco  03/20/2012    Procedure: CATARACT EXTRACTION PHACO AND INTRAOCULAR LENS PLACEMENT (IOC);  Surgeon: Williams Che, MD;  Location: [facility];  Service: Ophthalmology;  Laterality: Right;  CDE:  8.45  . Colonoscopy  2009    repeat 5 years  . Cataract extraction w/phaco Left 04/02/2013    Procedure: CATARACT EXTRACTION PHACO AND INTRAOCULAR LENS PLACEMENT (IOC);  Surgeon: Williams Che, MD;  Location: [facility];  Service: Ophthalmology;  Laterality: Left;  CDE:  6.50  . Laparoscopic partial colectomy N/A 06/11/2013    Procedure: LAPAROSCOPIC HAND ASSISTED PARTIAL COLECTOMY;  Surgeon: Jamesetta So, MD;  Location: [facility];  Service: General;  Laterality: N/A;  . Yag laser application Left 40/98/1191    Procedure: YAG LASER APPLICATION;  Surgeon: Williams Che, MD;  Location: [facility];  Service: Ophthalmology;  Laterality: Left;  . Esophagogastroduodenoscopy    . Hernia repair Left     inguinal   Family History  Problem Relation Age of Onset  . Hypertension Mother   . COPD Father   . Cancer Brother     brain  History  Substance Use Topics  . Smoking status: Former Smoker -- 1.00 packs/day for 35 years    Types: Cigarettes    Quit date: 03/28/2003  . Smokeless tobacco: Not on file  . Alcohol Use: No    Review of Systems ROS: Statement: All systems negative except as marked or noted in the HPI; Constitutional: Negative for fever and chills. ; ; Eyes: Negative for eye pain, redness and discharge. ; ; ENMT: Negative for ear pain, hoarseness, nasal congestion, sinus pressure and sore throat. ; ; Cardiovascular: Negative for chest pain, palpitations, diaphoresis, dyspnea and peripheral edema. ; ; Respiratory: Negative for cough, wheezing and stridor. ; ; Gastrointestinal: +constipation. Negative for nausea, vomiting, diarrhea, abdominal pain, blood in stool, hematemesis, jaundice and rectal bleeding. .  ; ; Genitourinary: +dysuria, urinary retention. Negative for flank pain and hematuria. ;; Genital:  No penile drainage or rash, no testicular pain or swelling, no scrotal rash or swelling. ;; Musculoskeletal: Negative for back pain and neck pain. Negative for swelling and trauma.; ; Skin: Negative for pruritus, rash, abrasions, blisters, bruising and skin lesion.; ; Neuro: Negative for headache, lightheadedness and neck stiffness. Negative for weakness, altered level of consciousness , altered mental status, extremity weakness, paresthesias, involuntary movement, seizure and syncope.      Allergies  Cefzil; Dexamethasone; Methocarbamol; Neomycin; Tetracyclines & related; Ciprofloxacin; and Penicillins  Home Medications   Prior to Admission medications   Medication Sig Start Date End Date Taking? Authorizing Provider  albuterol (PROVENTIL HFA;VENTOLIN HFA) 108 (90 BASE) MCG/ACT inhaler Inhale 2 puffs into the lungs every 6 (six) hours as needed for wheezing. 01/30/14  Yes Mikey Kirschner, MD  albuterol (PROVENTIL) (2.5 MG/3ML) 0.083% nebulizer solution INHALE 1 VIAL VIA NEBULIZER FOUR TIMES DAILY. Patient taking differently: INHALE 1 VIAL VIA NEBULIZER ONE TO TWO TIMES DAILY AS NEEDED 10/24/14  Yes Mikey Kirschner, MD  cetirizine (ZYRTEC) 10 MG tablet Take 10 mg by mouth daily.   Yes Historical Provider, MD  Cranberry-Vitamin C-Probiotic (AZO CRANBERRY PO) Take 1 tablet by mouth daily.   Yes Historical Provider, MD  diazepam (VALIUM) 5 MG tablet Take 1 tablet (5 mg total) by mouth every 6 (six) hours as needed for muscle spasms. 11/05/14  Yes Consuella Lose, MD  docusate sodium (COLACE) 100 MG capsule Take 100 mg by mouth 2 (two) times daily.   Yes Historical Provider, MD  esomeprazole (NEXIUM) 40 MG capsule Take 40 mg by mouth daily before breakfast.     Yes Historical Provider, MD  lovastatin (MEVACOR) 40 MG tablet TAKE 2 TABLETS BY MOUTH DAILY. Patient taking differently: TAKE 1 TABLET BY  MOUTH DAILY. 07/31/14  Yes Mikey Kirschner, MD  Multiple Vitamins-Minerals (MULTIVITAMIN & MINERAL PO) Take 1 tablet by mouth daily.   Yes Historical Provider, MD  nitroGLYCERIN (NITROSTAT) 0.4 MG SL tablet Place 1 tablet (0.4 mg total) under the tongue every 5 (five) minutes as needed. 11/04/10  Yes Josue Hector, MD  Omega-3 Fatty Acids (OMEGA-3 FISH OIL PO) Take 1 tablet by mouth daily.   Yes Historical Provider, MD  oxyCODONE-acetaminophen (PERCOCET) 10-325 MG per tablet Take 1 tablet by mouth every 4 (four) hours as needed for pain. 11/05/14  Yes Consuella Lose, MD  Probiotic CAPS Take 1 capsule by mouth daily.   Yes Historical Provider, MD  Sulfacetamide Sodium-Sulfur 10-5 % FOAM Apply 1 application topically daily as needed (rosazea).   Yes Historical Provider, MD  sulfamethoxazole-trimethoprim (BACTRIM DS,SEPTRA DS) 800-160 MG per tablet Take  1 tablet by mouth 2 (two) times daily.  11/09/14  Yes Historical Provider, MD  tamsulosin (FLOMAX) 0.4 MG CAPS capsule Take 0.4 mg by mouth daily.  09/25/14  Yes Historical Provider, MD  traMADol (ULTRAM) 50 MG tablet Take 1 tablet (50 mg total) by mouth 2 (two) times daily as needed. 07/04/14  Yes Josue Hector, MD  carvedilol (COREG) 6.25 MG tablet Take 1 tablet (6.25 mg total) by mouth 2 (two) times daily. Patient not taking: Reported on 11/11/2014 10/21/14   Josue Hector, MD  montelukast (SINGULAIR) 10 MG tablet TAKE ONE TABLET BY MOUTH AT BEDTIME AS NEEDED FOR ALLERGIES. Patient not taking: Reported on 11/11/2014 07/22/14   Mikey Kirschner, MD  ranolazine (RANEXA) 500 MG 12 hr tablet Take 1 tablet (500 mg total) by mouth 2 (two) times daily. Patient not taking: Reported on 11/11/2014 10/28/14   Josue Hector, MD   BP 115/75 mmHg  Pulse 96  Temp(Src) 98.1 F (36.7 C) (Oral)  Resp 16  Ht 6\' 1"  (1.854 m)  Wt 203 lb (92.08 kg)  BMI 26.79 kg/m2  SpO2 90% Physical Exam  1605: Physical examination:  Nursing notes reviewed; Vital signs and O2 SAT  reviewed;  Constitutional: Well developed, Well nourished, Uncomfortable appearing.; Head:  Normocephalic, atraumatic; Eyes: EOMI, PERRL, No scleral icterus; ENMT: Mouth and pharynx normal, Mucous membranes dry. +thrush.; Neck: Supple, Full range of motion, No lymphadenopathy; Cardiovascular: Regular rate and rhythm, No gallop; Respiratory: Breath sounds clear & equal bilaterally, No rales, rhonchi, wheezes.  Speaking full sentences with ease, Normal respiratory effort/excursion; Chest: Nontender, Movement normal; Abdomen: Soft, +suprapubic distention and tenderness. Normal bowel sounds; Genitourinary: No CVA tenderness; Extremities: Pulses normal, No tenderness, +1 pedal edema bilat. No calf edema or asymmetry.; Neuro: AA&Ox3, +HOH, otherwise major CN grossly intact. No facial droop. Speech clear. No gross focal motor or sensory deficits in extremities.; Skin: Color normal, Warm, Dry.   ED Course  Procedures     EKG Interpretation None      MDM  MDM Reviewed: previous chart, nursing note and vitals Reviewed previous: labs Interpretation: labs      Results for orders placed or performed during the hospital encounter of 11/11/14  Urinalysis, Routine w reflex microscopic (not at [facility])  Result Value Ref Range   Color, Urine YELLOW YELLOW   APPearance CLOUDY (A) CLEAR   Specific Gravity, Urine 1.010 1.005 - 1.030   pH 7.5 5.0 - 8.0   Glucose, UA NEGATIVE NEGATIVE mg/dL   Hgb urine dipstick TRACE (A) NEGATIVE   Bilirubin Urine NEGATIVE NEGATIVE   Ketones, ur NEGATIVE NEGATIVE mg/dL   Protein, ur NEGATIVE NEGATIVE mg/dL   Urobilinogen, UA 1.0 0.0 - 1.0 mg/dL   Nitrite POSITIVE (A) NEGATIVE   Leukocytes, UA MODERATE (A) NEGATIVE  Urine microscopic-add on  Result Value Ref Range   WBC, UA 21-50 <3 WBC/hpf   RBC / HPF 0-2 <3 RBC/hpf   Bacteria, UA MANY (A) RARE  Comprehensive metabolic panel  Result Value Ref Range   Sodium 131 (L) 135 - 145 mmol/L   Potassium 4.0 3.5 - 5.1 mmol/L    Chloride 96 (L) 101 - 111 mmol/L   CO2 27 22 - 32 mmol/L   Glucose, Bld 113 (H) 65 - 99 mg/dL   BUN 13 6 - 20 mg/dL   Creatinine, Ser 0.92 0.61 - 1.24 mg/dL   Calcium 8.4 (L) 8.9 - 10.3 mg/dL   Total Protein 6.9 6.5 - 8.1 g/dL  Albumin 3.1 (L) 3.5 - 5.0 g/dL   AST 30 15 - 41 U/L   ALT 41 17 - 63 U/L   Alkaline Phosphatase 78 38 - 126 U/L   Total Bilirubin 0.5 0.3 - 1.2 mg/dL   GFR calc non Af Amer >60 >60 mL/min   GFR calc Af Amer >60 >60 mL/min   Anion gap 8 5 - 15  Lipase, blood  Result Value Ref Range   Lipase 26 22 - 51 U/L  CBC with Differential  Result Value Ref Range   WBC 9.4 4.0 - 10.5 K/uL   RBC 3.79 (L) 4.22 - 5.81 MIL/uL   Hemoglobin 10.7 (L) 13.0 - 17.0 g/dL   HCT 33.5 (L) 39.0 - 52.0 %   MCV 88.4 78.0 - 100.0 fL   MCH 28.2 26.0 - 34.0 pg   MCHC 31.9 30.0 - 36.0 g/dL   RDW 14.9 11.5 - 15.5 %   Platelets 250 150 - 400 K/uL   Neutrophils Relative % 77 43 - 77 %   Neutro Abs 7.3 1.7 - 7.7 K/uL   Lymphocytes Relative 11 (L) 12 - 46 %   Lymphs Abs 1.0 0.7 - 4.0 K/uL   Monocytes Relative 9 3 - 12 %   Monocytes Absolute 0.9 0.1 - 1.0 K/uL   Eosinophils Relative 3 0 - 5 %   Eosinophils Absolute 0.3 0.0 - 0.7 K/uL   Basophils Relative 0 0 - 1 %   Basophils Absolute 0.0 0.0 - 0.1 K/uL  Lactic acid, plasma  Result Value Ref Range   Lactic Acid, Venous 1.2 0.5 - 2.0 mmol/L   Dg Abd 1 View 11/11/2014   CLINICAL DATA:  Difficulty urinating with burning and pelvic pressure for 6 days.  EXAM: ABDOMEN - 1 VIEW  COMPARISON:  None.  FINDINGS: The bowel gas pattern is nonobstructive. Prominent stool burden in the transverse colon is noted. No unexpected abdominal calcification is seen. The patient is status post lower lumbar fusion.  IMPRESSION: No acute abnormality.   Electronically Signed   By: Inge Rise M.D.   On: 11/11/2014 17:46    1820:  Pt's bladder scan was 371ml post void. Foley placed with 32ml immediate urine return. Pt states he feels "much better now."  Abd soft/NT, VSS.  +UTI, UC pending; will dose IV rocephin. This is pt's 2nd course of PO abx for UTI, now foley was placed today; will observation admit. T/C to [facility] Dr. Marily Memos, case discussed, including:  HPI, pertinent PM/SHx, VS/PE, dx testing, ED course and treatment:  Agreeable to come to ED for evaluation to admit.  1940:  [facility] Dr. Marily Memos has evaluated pt in the ED: states no indication for admission at this time, recommendations per consult note; pt and family are in agreement with [facility] MD plan and want to go home now. Dx and testing d/w pt and family.  Questions answered.  Verb understanding, agreeable to d/c home with outpt f/u tomorrow with his PMD to f/u UC.    Francine Graven, DO 11/14/14 1515

## 2014-11-11 NOTE — Discharge Instructions (Signed)
°Emergency Department Resource Guide °1) Find a Doctor and Pay Out of Pocket °Although you won't have to find out who is covered by your insurance plan, it is a good idea to ask around and get recommendations. You will then need to call the office and see if the doctor you have chosen will accept you as a new patient and what types of options they offer for patients who are self-pay. Some doctors offer discounts or will set up payment plans for their patients who do not have insurance, but you will need to ask so you aren't surprised when you get to your appointment. ° °2) Contact Your Local Health Department °Not all health departments have doctors that can see patients for sick visits, but many do, so it is worth a call to see if yours does. If you don't know where your local health department is, you can check in your phone book. The CDC also has a tool to help you locate your state's health department, and many state websites also have listings of all of their local health departments. ° °3) Find a Walk-in Clinic °If your illness is not likely to be very severe or complicated, you may want to try a walk in clinic. These are popping up all over the country in pharmacies, drugstores, and shopping centers. They're usually staffed by nurse practitioners or physician assistants that have been trained to treat common illnesses and complaints. They're usually fairly quick and inexpensive. However, if you have serious medical issues or chronic medical problems, these are probably not your best option. ° °No Primary Care Doctor: °- Call [facility] at  832-8000 - they can help you locate a primary care doctor that  accepts your insurance, provides certain services, etc. °- Physician Referral Service- 1-800-533-3463 ° °Chronic Pain Problems: °Organization         Address  Phone   Notes  °[facility]  (336) 297-2271 Patients need to be referred by their primary care doctor.  ° °Medication  Assistance: °Organization         Address  Phone   Notes  °[place] [facility] [address] °[place], [place] [address] (336) 641-8030 --Must be a resident of [place] °-- Must have NO insurance coverage whatsoever (no Medicaid/ Medicare, etc.) °-- The pt. MUST have a primary care doctor that directs their care regularly and follows them in the community °  °[facility]  (866) 331-1348   °[employer]  (888) 892-1162   ° °Agencies that provide inexpensive medical care: °Organization         Address  Phone   Notes  °[facility]  (336) 832-8035   °[facility]    (336) 832-7272   °[facility] [address] °[place], [place] [address] (336) 832-4777   °[facility] [address], °[place] (336) 271-4999   °[facility]    (336) 373-0678   °[facility]    (336) 272-1050   °[facility] ° [address], [place] Phone:  (336) 832-4444, Fax:  (336) 832-4440 Hours of Operation:  9 am - 6 pm, M-F.  Also accepts Medicaid/Medicare and self-pay.  °[facility] ° [address], [place] Phone: (336) 832-3150, Fax: (336) 832-3151. Hours of Operation:  8:30 am - 5:30 pm, M-F.  Also accepts Medicaid and self-pay.  °[facility] [place] [address], [place] Phone: (336) 878-6027   °[facility] [address], [place], [place] (336)723-1848, Ext. 123 Mondays & Thursdays: 7-9 AM.  First 15 patients are seen on a first come, first serve basis. °  ° °Medicaid-accepting [place] Providers: ° °Organization         Address  Phone   Notes  °[facility] [address], [place] (336) 641-2100 Also accepts self-pay patients.  °[facility] [address], [place] ° (336) 856-9996   °[facility] [address], [place]  (336) 288-8857   °[facility] [address] [place] [address], [place] (336) 299-7000   °Veita Bland [address], [place]  ° (336) 373-1557 Only accepts [place] Access Medicaid patients after they have their name applied to their card.  ° °Self-Pay (no insurance) in [place]: ° °Organization         Address  Phone   Notes  °Sickle Cell Patients, [facility] [address], [place] (336) 832-1970   °[facility] [address], [place] (336) 832-4400   °[facility] ° [address], [place] (336) 992-4800   °[facility]/Dr. Osei-Bonsu ° [address] [place] [address], [place] or [address], [place] (336) 841-8500 Phone number for both [place] and [place] locations is the same.  °[facility] [address], [place] (336) 299-0000   °[facility] [address] [place] [address], [place] or [address] (336) 852-7530 °(336) 878-2260   °[facility] [address], [place] (336) 350-1642, phone; (336) 294-5005, fax Sees patients 1st and 3rd Saturday of every month.  Must not qualify for public or private insurance (i.e. Medicaid, Medicare, [place] Health Choice, Veterans' Benefits) • Household income should be no more than 200% of the poverty level •The clinic cannot treat you if you are pregnant or think you are pregnant • Sexually transmitted diseases are not treated at the clinic.  ° ° °Dental Care: °Organization         Address  Phone  Notes  °[place] [facility] [address], [place] (336) 641-6152 Accepts children up to age 21 who are enrolled in Medicaid or [place] Health Choice; pregnant women with a Medicaid card; and children who have applied for Medicaid or [place] Health Choice, but were declined, whose parents can pay a reduced fee at time of service.  °[place]  [facility] [place]  [address], [place] (336) 641-7733 Accepts children up to age 21 who are enrolled in Medicaid or [place] Health Choice; pregnant women with a Medicaid card; and children who have applied for Medicaid or [place] Health Choice, but were declined, whose parents can pay a reduced fee at time of service.  °[facility] ° [address],  (336) 641-4533 Patients are seen by appointment only. Walk-ins are not accepted. [facility] will see patients 18 years of age and older. °Monday - Tuesday (8am-5pm) °Most Wednesdays (8:30-5pm) °$30 per visit, cash only  °[facility] ° [address], [place] (336) 641-4533 Patients are seen by appointment only. Walk-ins are not accepted. [facility] will see patients 18 years of age and older. °One   Wednesday Evening (Monthly: Volunteer Based).  $30 per visit, cash only  °[facility]  (919) 537-3737 for adults; Children under age 4, call Graduate Pediatric Dentistry at (919) [phone]. Children aged 4-14, please call (919) [phone] to request a pediatric application. ° Dental services are provided in all areas of dental care including fillings, crowns and bridges, complete and partial dentures, implants, gum treatment, root canals, and extractions. Preventive care is also provided. Treatment is provided to both adults and children. °Patients are selected via a lottery and there is often a waiting list. °  °[facility] [address], °[place] ° (336) 763-8833 [email or web address] °  °[facility] [address], [place], [place] (336)723-1848, Ext. 123 Second and Fourth Thursday of each month, opens at 6:30 AM; Clinic ends at 9 AM.  Patients are seen on a first-come first-served basis, and a limited number are seen during each clinic.  ° °[facility] ° [address], [place], [place] (336) 723-7904    Eligibility Requirements °You must have lived in [place], [place], or [place] counties for at least the last three months. °  You cannot be eligible for state or federal sponsored healthcare insurance, including Veterans Administration, Medicaid, or Medicare. °  You generally cannot be eligible for healthcare insurance through your employer.  °  How to apply: °Eligibility screenings are held every Tuesday and Wednesday afternoon from 1:00 pm until 4:00 pm. You do not need an appointment for the interview!  °[facility] [address], [place], [place] 336-631-2330   °[facility]  336-342-8273   °[facility]  336-703-3100   °[facility]  336-570-6415   ° °Behavioral Health Resources in the Community: °Intensive Outpatient Programs °Organization         Address  Phone  Notes  °[facility] [address], [place], [place] 336-878-6098   °[facility] [address], [place], [place] 336-832-9800   °[facility] [address], [place], [place] ° 336-882-2125   °[facility] [address],  °[place], [place] 1-800-853-5163 or 336-641-4981   °Substance Abuse Resources °Organization         Address  Phone  Notes  °[facility]  336-882-2125   °[facility]  336-784-9470   °[facility]  336-285-9073   °[facility]  336-845-3988   °Residential & Outpatient Substance Abuse Program  1-800-659-3381   °Psychological Services °Organization         Address  Phone  Notes  °[facility]  336- 832-9600   °[facility]  336- 378-7881   °[facility] [address], [place] 1-800-853-5163 or 336-641-4981   ° °Mobile Crisis Teams °Organization         Address  Phone  Notes  °[facility], Mobile Crisis Care Unit  1-877-626-1772   °Assertive °[facility] ° [address]  [place], [place] 336-834-9664   °Sharon DeEsch [address] °[place] 336-554-5454   ° °Self-Help/Support Groups °Organization         Address  Phone             Notes  °[facility] [place] - variety of support groups  336- 373-1402 Call for more information  °Narcotics Anonymous (NA), [facility] [address], °[place]  2 meetings at this location  ° °  Residential Treatment Programs Organization         Address  Phone  Notes  [facility] [address]    [place]  1-778-481-7955   [facility]  [address] [place] [address], [place], [place]   [place], [place] 956-684-7680 Admissions: 8am-3pm M-F  [facility] [address],    [place], [place] 856-314-9702   [facility] [address] [place], [place], [place]   [facility] [address],  [place], [place]   [facility] Intensive Outpatient [address], [place], [place]   [facility]  [place], [place] 1-(831) 677-0312 or [phone]   [facility] [address], [place], [place] Accepts Medicaid  [facility] [address]  [place] 1-952-015-0267 Substance Abuse/Addiction Treatment   [facility]         Address  Phone  Notes  [facility]  (726) 731-9581   Domenic Schwab, PhD [address] Arlis Porta [place], [place]   (437)225-0983 or 519-445-5037   [place], [place] (778)511-7190   [facility] [address] [place], [place] [phone] Insurance/Medicaid/sponsorship through [facility] and Families [address]                                    [place], [place] [phone] [place] [address] [place], [place] (281)[phone]    Dr. Adele Schilder  (551) [phone]   [facility] 1) [address] [place] 2) [place] 3)  [address], [place] [phone] 351-023-4998  [phone]   [place] 970-302-8635 or [phone] (After Hours)      Take the prescriptionsas directed.  Call your regular medical doctor tomorrow morning to schedule a follow up appointment within the next 1 day. Call the Urologist tomorrow morning to schedule a follow up appointment within the next 3 days.  Return to the Emergency Department immediately sooner if worsening.

## 2014-11-11 NOTE — ED Notes (Signed)
Pt alert & oriented x4, stable gait. Patient  given discharge instructions including foley care, paperwork & prescription(s). Patient verbalized understanding. Pt left department by wheelchair w/ no further questions.

## 2014-11-11 NOTE — ED Notes (Signed)
Bladder scanner 320 residual after voiding

## 2014-11-11 NOTE — ED Notes (Signed)
Difficulty urinating w/burning and pelvic pressure x 6 days.  Had extensive lumbar surgery last Tuesday and has been on Percocet since.  Has been on Bactrim x 4 days and was on it for 14 days prior to surgery.  Hx of prostate enlargement. No BMs since surgery.

## 2014-11-13 ENCOUNTER — Encounter: Payer: Self-pay | Admitting: Family Medicine

## 2014-11-13 ENCOUNTER — Ambulatory Visit (INDEPENDENT_AMBULATORY_CARE_PROVIDER_SITE_OTHER): Payer: Medicare Other | Admitting: Family Medicine

## 2014-11-13 VITALS — BP 122/70 | Temp 97.8°F | Ht 73.0 in | Wt 206.1 lb

## 2014-11-13 DIAGNOSIS — I2510 Atherosclerotic heart disease of native coronary artery without angina pectoris: Secondary | ICD-10-CM

## 2014-11-13 DIAGNOSIS — B370 Candidal stomatitis: Secondary | ICD-10-CM | POA: Diagnosis not present

## 2014-11-13 DIAGNOSIS — N410 Acute prostatitis: Secondary | ICD-10-CM | POA: Diagnosis not present

## 2014-11-13 DIAGNOSIS — K219 Gastro-esophageal reflux disease without esophagitis: Secondary | ICD-10-CM | POA: Diagnosis not present

## 2014-11-13 DIAGNOSIS — R030 Elevated blood-pressure reading, without diagnosis of hypertension: Secondary | ICD-10-CM

## 2014-11-13 DIAGNOSIS — R338 Other retention of urine: Secondary | ICD-10-CM | POA: Diagnosis not present

## 2014-11-13 NOTE — Progress Notes (Signed)
   Subjective:    Patient ID: [name] [name], male    DOB: 1940-08-16, 74 y.o.   MRN: [id number] Patient arrives to office with multiple concerns. HPI  Patient in today to be seen for follow up post ER for urinary Rentention on 6/27. Patient states that he recently had surgery and had a catheter in place. Was discharged from hospital for surgery 6/22. Upon removal of the catheter he had burning, with urinary retention. Patient states that after being discharged for surgery he still has had urinary retention. Was told by the ER MD to follow up with PCP for urinary retention and to see specialist. Patient states that he currently has catheter in. Patient also states that he has c/o of burning , and pain.Patient states no other concerns this visit.   Blood pressure has been somewhat on the low side recently. This despite having stopped the medication.  Patient notes diminished appetite the last few weeks. Overall went through surgery well but has had complications as noted above.  Occasional mild wheezing off-and-on since surgery  Review of Systems No headache no chest pain ongoing back pain bowel habits reasonable    Objective:   Physical Exam  Alert vitals stable blood pressure 110 or 70 HEENT normal. Lungs trace wheeze otherwise clear. Heart regular rate and rhythm. Abdomen benign     Assessment & Plan:  Impression 1 urinary tract infection with retention discussed at length #2 hypertension issues stable this time #3 nutrition concerns albumen low discussed #4 reactive airways stable on when necessary albuterol plan maintain when necessary albuterol. Add [employer] twice a day. Rationale discussed. To see urologist later today. Maintain same chronic medications. Recheck in 3 months. WSL

## 2014-11-13 NOTE — Patient Instructions (Signed)
C[place] instant breakfast up to twice per day as needed

## 2014-11-14 LAB — URINE CULTURE: Culture: >=100000

## 2014-11-15 ENCOUNTER — Telehealth: Payer: Self-pay | Admitting: Emergency Medicine

## 2014-11-15 DIAGNOSIS — R338 Other retention of urine: Secondary | ICD-10-CM | POA: Diagnosis not present

## 2014-11-15 NOTE — Telephone Encounter (Signed)
Post ED Visit - Positive Culture Follow-up: Successful Patient Follow-Up  Culture assessed and recommendations reviewed by: []  Heide Guile, Pharm.D., BCPS-AQ ID []  Alycia Rossetti, Pharm.D., BCPS []  [place], [place].D., BCPS, AAHIVP []  Legrand Como, Pharm.D., BCPS, AAHIVP []  Tegan Magsam, Pharm.D. []  Levester Fresh, Pharm.D.  Positive Urine culture  []  Patient discharged without antimicrobial prescription and treatment is now indicated [x]  Organism is resistant to prescribed ED discharge antimicrobial []  Patient with positive blood cultures  Changes discussed with ED provider: Glendell Docker, NP New antibiotic prescription: Macrobid 100 mg PO BID x seven days Called to [place] [phone]  Contacted patient, date 11/15/14, time 1415   Ernesta Amble 11/15/2014, 2:22 PM

## 2014-11-15 NOTE — Progress Notes (Signed)
ED Antimicrobial Stewardship Positive Culture Follow Up   [name] [name] is an 74 y.o. male who presented to [facility] on 11/11/2014 with a chief complaint of  Chief Complaint  Patient presents with  . Pelvic Pain    Recent Results (from the past 720 hour(s))  Surgical pcr screen     Status: None   Collection Time: 10/30/14  3:55 PM  Result Value Ref Range Status   MRSA, PCR NEGATIVE NEGATIVE Final   Staphylococcus aureus NEGATIVE NEGATIVE Final    Comment:        The Xpert SA Assay (FDA approved for NASAL specimens in patients over 75 years of age), is one component of a comprehensive surveillance program.  Test performance has been validated by [facility] for patients greater than or equal to 46 year old. It is not intended to diagnose infection nor to guide or monitor treatment.   Urine culture     Status: None   Collection Time: 11/11/14  4:43 PM  Result Value Ref Range Status   Specimen Description URINE, CATHETERIZED  Final   Special Requests NONE  Final   Culture   Final    >=100,000 COLONIES/mL CITROBACTER AMALONATICUS Performed at [facility]    Report Status 11/14/2014 FINAL  Final   Organism ID, Bacteria CITROBACTER AMALONATICUS  Final      Susceptibility   Citrobacter amalonaticus - MIC*    CEFAZOLIN >=64 RESISTANT Resistant     CEFTRIAXONE <=1 SENSITIVE Sensitive     CIPROFLOXACIN <=0.25 SENSITIVE Sensitive     GENTAMICIN <=1 SENSITIVE Sensitive     IMIPENEM <=0.25 SENSITIVE Sensitive     NITROFURANTOIN 32 SENSITIVE Sensitive     TRIMETH/SULFA >=320 RESISTANT Resistant     PIP/TAZO <=4 SENSITIVE Sensitive     * >=100,000 COLONIES/mL CITROBACTER AMALONATICUS    [x]  Treated with bactrim and cephalexin, organism resistant to prescribed antimicrobial []  Patient discharged originally without antimicrobial agent and treatment is now indicated  New antibiotic prescription: Discontinue both bactrim and cephalexin. Start macrobid 100 mg BID x 1  week  ED Provider: Glendell Docker, NP  [name] 11/15/2014, 8:38 AM Infectious Diseases Pharmacist Phone# [phone]

## 2014-11-25 DIAGNOSIS — M4316 Spondylolisthesis, lumbar region: Secondary | ICD-10-CM | POA: Diagnosis not present

## 2014-11-25 DIAGNOSIS — I10 Essential (primary) hypertension: Secondary | ICD-10-CM | POA: Diagnosis not present

## 2014-11-25 DIAGNOSIS — Z6827 Body mass index (BMI) 27.0-27.9, adult: Secondary | ICD-10-CM | POA: Diagnosis not present

## 2014-11-25 DIAGNOSIS — M5416 Radiculopathy, lumbar region: Secondary | ICD-10-CM | POA: Diagnosis not present

## 2014-11-28 ENCOUNTER — Ambulatory Visit (HOSPITAL_COMMUNITY)
Admission: RE | Admit: 2014-11-28 | Discharge: 2014-11-28 | Disposition: A | Discharge status: Home / Self Care | Payer: Medicare Other | Source: Ambulatory Visit | Attending: Vascular Surgery | Admitting: Vascular Surgery

## 2014-11-28 ENCOUNTER — Other Ambulatory Visit (HOSPITAL_COMMUNITY): Payer: Self-pay | Admitting: Neurosurgery

## 2014-11-28 DIAGNOSIS — M7989 Other specified soft tissue disorders: Secondary | ICD-10-CM | POA: Insufficient documentation

## 2014-11-28 DIAGNOSIS — M79662 Pain in left lower leg: Secondary | ICD-10-CM | POA: Insufficient documentation

## 2014-11-28 DIAGNOSIS — M79661 Pain in right lower leg: Secondary | ICD-10-CM

## 2014-11-29 ENCOUNTER — Other Ambulatory Visit: Payer: Self-pay | Admitting: Family Medicine

## 2014-12-03 ENCOUNTER — Ambulatory Visit (INDEPENDENT_AMBULATORY_CARE_PROVIDER_SITE_OTHER): Payer: Medicare Other | Admitting: Family Medicine

## 2014-12-03 ENCOUNTER — Encounter: Payer: Self-pay | Admitting: Family Medicine

## 2014-12-03 VITALS — BP 144/88 | Ht 73.0 in | Wt 211.0 lb

## 2014-12-03 DIAGNOSIS — Z79899 Other long term (current) drug therapy: Secondary | ICD-10-CM

## 2014-12-03 DIAGNOSIS — R899 Unspecified abnormal finding in specimens from other organs, systems and tissues: Secondary | ICD-10-CM | POA: Diagnosis not present

## 2014-12-03 DIAGNOSIS — R1900 Intra-abdominal and pelvic swelling, mass and lump, unspecified site: Secondary | ICD-10-CM | POA: Diagnosis not present

## 2014-12-03 DIAGNOSIS — R300 Dysuria: Secondary | ICD-10-CM

## 2014-12-03 DIAGNOSIS — R358 Other polyuria: Secondary | ICD-10-CM

## 2014-12-03 DIAGNOSIS — R3589 Other polyuria: Secondary | ICD-10-CM

## 2014-12-03 DIAGNOSIS — R0600 Dyspnea, unspecified: Secondary | ICD-10-CM | POA: Diagnosis not present

## 2014-12-03 DIAGNOSIS — R600 Localized edema: Secondary | ICD-10-CM

## 2014-12-03 DIAGNOSIS — I2510 Atherosclerotic heart disease of native coronary artery without angina pectoris: Secondary | ICD-10-CM

## 2014-12-03 LAB — POCT URINALYSIS DIPSTICK
Spec Grav, UA: 1.015
pH, UA: 6

## 2014-12-03 MED ORDER — FUROSEMIDE 20 MG PO TABS: 20.0000 mg | ORAL_TABLET | Freq: Every day | ORAL | Status: DC

## 2014-12-03 MED ORDER — HYDROCODONE-ACETAMINOPHEN 7.5-325 MG PO TABS: 1.0000 | ORAL_TABLET | ORAL | Status: DC | PRN

## 2014-12-03 MED ORDER — POTASSIUM CHLORIDE CRYS ER 20 MEQ PO TBCR: 20.0000 meq | EXTENDED_RELEASE_TABLET | Freq: Every day | ORAL | Status: DC

## 2014-12-03 NOTE — Progress Notes (Signed)
   Subjective:    Patient ID: [name] [name], male    DOB: 11/15/1940, 74 y.o.   MRN: [id number]  HPIBilateral arm and leg pain. Started about 2 weeks ago.   Bilateral foot and leg swelling and pain about 2 weeks.   Taking percocet and ibuprofen for the pain.  Tried lyrica but he only took one pill wanted to get dr Jilliane Kazanjian's opinion before taking anymore.   Had spinal surgery about 4 weeks ago. Fused l4 l5 Had urinary catheter , had mild infection afterwards Went home Then had to go back to the ER Saw dr Hartley Barefoot Placed pt on Urabel Took cath out Pt was placed on keflex, ( car apoth) Follow up with neurosurgeon Then about 1 week ago started having leg swelling, was placed on bactrim Was taking a lot of ibuprofen  5 weeks prior to surgery had Tx for prostatitis was tx 21 day of Bactrim Last Thursday had bilateral U/S on legs Specialist with statement that the u/s of legs were normal   Had vein study last week. Saw neurosurgeon yesterday. Pt states study was fine.   Having burning with urination.   Greater than 40 minutes spent with eval Review of Systems  Constitutional: Negative for fever and fatigue.  HENT: Negative for congestion.   Respiratory: Negative for cough and shortness of breath.   Cardiovascular: Positive for leg swelling. Negative for chest pain.  Gastrointestinal: Positive for constipation. Negative for abdominal pain and blood in stool.  Genitourinary: Positive for dysuria. Negative for hematuria.       Objective:   Physical Exam   lungs are clear hearts regular abdomen is soft no sign of infection in the legs   significant swelling noted in both legs 1+ edema and no pain or discomfort no calf pain    Assessment & Plan:   very complex issue.  I believe the pain in his arms his potentially a side effect to what he is taking he describes that his pain but he also describes as a burning since patient and sometimes itching in his hands so therefore we will  stop cassette in transition him to hydrocodone every 4 hours as needed for the back pain patient was encouraged that if he is not having pain to try to avoid the medication    the swelling in the legs is partially due to inactivity partially due to oxycodone taper away from medication. Patient states he did have Doppler already which ruled out DVT. He will be doing blood work to help rule out other problems. It is possible we may have to even do a CT scan of his abdomen rule out intrapelvic issue causing the swelling in the legs very he will follow-up within 2 weeks time with Dr. Richardson Landry follow-up sooner problems. Lasix and potassium prescribed today.   in addition to this I believe the patient also has significant BPH if this worsens he may need medication for this he is supposed to follow-up with urologist in the next few weeks urine today was negative

## 2014-12-04 ENCOUNTER — Encounter (HOSPITAL_COMMUNITY): Payer: Self-pay | Admitting: Emergency Medicine

## 2014-12-04 ENCOUNTER — Emergency Department (HOSPITAL_COMMUNITY)
Admission: EM | Admit: 2014-12-04 | Discharge: 2014-12-04 | Disposition: A | Discharge status: Home / Self Care | Payer: Medicare Other | Attending: Emergency Medicine | Admitting: Emergency Medicine

## 2014-12-04 ENCOUNTER — Telehealth: Payer: Self-pay | Admitting: Neurology

## 2014-12-04 DIAGNOSIS — M1990 Unspecified osteoarthritis, unspecified site: Secondary | ICD-10-CM | POA: Diagnosis not present

## 2014-12-04 DIAGNOSIS — M791 Myalgia, unspecified site: Secondary | ICD-10-CM

## 2014-12-04 DIAGNOSIS — Z8639 Personal history of other endocrine, nutritional and metabolic disease: Secondary | ICD-10-CM | POA: Diagnosis not present

## 2014-12-04 DIAGNOSIS — M79601 Pain in right arm: Secondary | ICD-10-CM | POA: Diagnosis present

## 2014-12-04 DIAGNOSIS — Z880 Allergy status to penicillin: Secondary | ICD-10-CM | POA: Insufficient documentation

## 2014-12-04 DIAGNOSIS — Z85828 Personal history of other malignant neoplasm of skin: Secondary | ICD-10-CM | POA: Insufficient documentation

## 2014-12-04 DIAGNOSIS — I10 Essential (primary) hypertension: Secondary | ICD-10-CM | POA: Insufficient documentation

## 2014-12-04 DIAGNOSIS — Z862 Personal history of diseases of the blood and blood-forming organs and certain disorders involving the immune mechanism: Secondary | ICD-10-CM | POA: Insufficient documentation

## 2014-12-04 DIAGNOSIS — I252 Old myocardial infarction: Secondary | ICD-10-CM | POA: Insufficient documentation

## 2014-12-04 DIAGNOSIS — J449 Chronic obstructive pulmonary disease, unspecified: Secondary | ICD-10-CM | POA: Insufficient documentation

## 2014-12-04 DIAGNOSIS — K219 Gastro-esophageal reflux disease without esophagitis: Secondary | ICD-10-CM | POA: Insufficient documentation

## 2014-12-04 DIAGNOSIS — I2510 Atherosclerotic heart disease of native coronary artery without angina pectoris: Secondary | ICD-10-CM | POA: Insufficient documentation

## 2014-12-04 DIAGNOSIS — Z8619 Personal history of other infectious and parasitic diseases: Secondary | ICD-10-CM | POA: Insufficient documentation

## 2014-12-04 DIAGNOSIS — D649 Anemia, unspecified: Secondary | ICD-10-CM

## 2014-12-04 DIAGNOSIS — Z87438 Personal history of other diseases of male genital organs: Secondary | ICD-10-CM | POA: Insufficient documentation

## 2014-12-04 DIAGNOSIS — H9190 Unspecified hearing loss, unspecified ear: Secondary | ICD-10-CM | POA: Insufficient documentation

## 2014-12-04 DIAGNOSIS — Z9889 Other specified postprocedural states: Secondary | ICD-10-CM | POA: Insufficient documentation

## 2014-12-04 LAB — URINALYSIS, ROUTINE W REFLEX MICROSCOPIC
Bilirubin Urine: NEGATIVE
Glucose, UA: NEGATIVE mg/dL
Ketones, ur: NEGATIVE mg/dL
Leukocytes, UA: NEGATIVE
Nitrite: NEGATIVE
Specific Gravity, Urine: 1.025 (ref 1.005–1.030)
Urobilinogen, UA: 0.2 mg/dL (ref 0.0–1.0)
pH: 7 (ref 5.0–8.0)

## 2014-12-04 LAB — CBC WITH DIFFERENTIAL/PLATELET
Basophils Absolute: 0.1 10*3/uL (ref 0.0–0.2)
Basophils Absolute: 0 10*3/uL (ref 0.0–0.1)
Basophils Relative: 1 % (ref 0–1)
Basos: 1 %
EOS (ABSOLUTE): 0.7 10*3/uL — ABNORMAL HIGH (ref 0.0–0.4)
Eos: 11 %
Eosinophils Absolute: 0.9 10*3/uL — ABNORMAL HIGH (ref 0.0–0.7)
Eosinophils Relative: 13 % — ABNORMAL HIGH (ref 0–5)
HCT: 33.2 % — ABNORMAL LOW (ref 39.0–52.0)
Hematocrit: 33.3 % — ABNORMAL LOW (ref 37.5–51.0)
Hemoglobin: 10.4 g/dL — ABNORMAL LOW (ref 12.6–17.7)
Hemoglobin: 10.3 g/dL — ABNORMAL LOW (ref 13.0–17.0)
Immature Grans (Abs): 0 10*3/uL (ref 0.0–0.1)
Immature Granulocytes: 0 %
Lymphocytes Absolute: 1.4 10*3/uL (ref 0.7–3.1)
Lymphocytes Relative: 30 % (ref 12–46)
Lymphs Abs: 2 10*3/uL (ref 0.7–4.0)
Lymphs: 23 %
MCH: 27.2 pg (ref 26.6–33.0)
MCH: 27.5 pg (ref 26.0–34.0)
MCHC: 31.2 g/dL — ABNORMAL LOW (ref 31.5–35.7)
MCHC: 31 g/dL (ref 30.0–36.0)
MCV: 87 fL (ref 79–97)
MCV: 88.5 fL (ref 78.0–100.0)
Monocytes Absolute: 0.7 10*3/uL (ref 0.1–0.9)
Monocytes Absolute: 0.5 10*3/uL (ref 0.1–1.0)
Monocytes Relative: 7 % (ref 3–12)
Monocytes: 12 %
Neutro Abs: 3.4 10*3/uL (ref 1.7–7.7)
Neutrophils Absolute: 3.2 10*3/uL (ref 1.4–7.0)
Neutrophils Relative %: 49 % (ref 43–77)
Neutrophils: 53 %
Platelets: 258 10*3/uL (ref 150–379)
Platelets: 241 10*3/uL (ref 150–400)
RBC: 3.83 x10E6/uL — ABNORMAL LOW (ref 4.14–5.80)
RBC: 3.75 MIL/uL — ABNORMAL LOW (ref 4.22–5.81)
RDW: 14.7 % (ref 12.3–15.4)
RDW: 14.4 % (ref 11.5–15.5)
WBC: 6 10*3/uL (ref 3.4–10.8)
WBC: 6.8 10*3/uL (ref 4.0–10.5)

## 2014-12-04 LAB — HEPATIC FUNCTION PANEL
ALT: 16 IU/L (ref 0–44)
AST: 17 IU/L (ref 0–40)
Albumin: 4 g/dL (ref 3.5–4.8)
Alkaline Phosphatase: 87 IU/L (ref 39–117)
Bilirubin Total: 0.3 mg/dL (ref 0.0–1.2)
Bilirubin, Direct: 0.11 mg/dL (ref 0.00–0.40)
Total Protein: 6.5 g/dL (ref 6.0–8.5)

## 2014-12-04 LAB — COMPREHENSIVE METABOLIC PANEL
ALT: 17 U/L (ref 17–63)
AST: 21 U/L (ref 15–41)
Albumin: 3.6 g/dL (ref 3.5–5.0)
Alkaline Phosphatase: 82 U/L (ref 38–126)
Anion gap: 8 (ref 5–15)
BUN: 17 mg/dL (ref 6–20)
CO2: 29 mmol/L (ref 22–32)
Calcium: 8.7 mg/dL — ABNORMAL LOW (ref 8.9–10.3)
Chloride: 104 mmol/L (ref 101–111)
Creatinine, Ser: 0.86 mg/dL (ref 0.61–1.24)
GFR calc Af Amer: >60 mL/min (ref >60)
GFR calc non Af Amer: >60 mL/min (ref >60)
Glucose, Bld: 122 mg/dL — ABNORMAL HIGH (ref 65–99)
Potassium: 3.7 mmol/L (ref 3.5–5.1)
Sodium: 141 mmol/L (ref 135–145)
Total Bilirubin: 0.3 mg/dL (ref 0.3–1.2)
Total Protein: 7.1 g/dL (ref 6.5–8.1)

## 2014-12-04 LAB — BASIC METABOLIC PANEL
BUN/Creatinine Ratio: 15 (ref 10–22)
BUN: 14 mg/dL (ref 8–27)
CO2: 28 mmol/L (ref 18–29)
Calcium: 8.9 mg/dL (ref 8.6–10.2)
Chloride: 99 mmol/L (ref 97–108)
Creatinine, Ser: 0.91 mg/dL (ref 0.76–1.27)
GFR calc Af Amer: 96 mL/min/{1.73_m2} (ref >59)
GFR calc non Af Amer: 83 mL/min/{1.73_m2} (ref >59)
Glucose: 102 mg/dL — ABNORMAL HIGH (ref 65–99)
Potassium: 4.2 mmol/L (ref 3.5–5.2)
Sodium: 141 mmol/L (ref 134–144)

## 2014-12-04 LAB — URINE MICROSCOPIC-ADD ON

## 2014-12-04 LAB — CK: Total CK: 132 U/L (ref 49–397)

## 2014-12-04 LAB — D-DIMER, QUANTITATIVE (NOT AT ARMC): D-DIMER: 1.73 mg/L FEU — ABNORMAL HIGH (ref 0.00–0.49)

## 2014-12-04 LAB — BRAIN NATRIURETIC PEPTIDE: BNP: 35.6 pg/mL (ref 0.0–100.0)

## 2014-12-04 MED ORDER — ONDANSETRON HCL 4 MG/2ML IJ SOLN
4.0000 mg | Freq: Once | INTRAMUSCULAR | Status: AC
  Administered 2014-12-04: 4 mg via INTRAVENOUS

## 2014-12-04 MED ORDER — HYDROMORPHONE HCL 1 MG/ML IJ SOLN
1.0000 mg | Freq: Once | INTRAMUSCULAR | Status: AC
  Administered 2014-12-04: 1 mg via INTRAVENOUS
  Filled 2014-12-04: qty 1

## 2014-12-04 MED ORDER — HYDROMORPHONE HCL 1 MG/ML IJ SOLN
1.0000 mg | Freq: Once | INTRAMUSCULAR | Status: AC
  Administered 2014-12-04: 1 mg via INTRAVENOUS

## 2014-12-04 MED ORDER — ONDANSETRON HCL 4 MG/2ML IJ SOLN
INTRAMUSCULAR | Status: AC
  Filled 2014-12-04: qty 2

## 2014-12-04 MED ORDER — HYDROMORPHONE HCL 2 MG PO TABS: 2.0000 mg | ORAL_TABLET | ORAL | Status: DC | PRN

## 2014-12-04 MED ORDER — HYDROMORPHONE HCL 1 MG/ML IJ SOLN
INTRAMUSCULAR | Status: AC
  Filled 2014-12-04: qty 1

## 2014-12-04 NOTE — ED Notes (Signed)
Pt c/o bilateral arm pain x 5 days. Pt states he had spinal surgery on 11/05/14

## 2014-12-04 NOTE — Telephone Encounter (Signed)
I spoke with this nice patient this evening at the request of Dr. Wolfgang Phoenix. He has severe burning in the hands. He is significantly hard of hearing and so it was difficult to have him try and localize more for me over the phone.  But from what I possibly gathered, he notices the burning more in the last 3 fingers of the hands. The burning radiates to the wrist and then to the elbow. It radiates on the side of the pinky up to the elbow mostly but sometimes the pain is more diffuse. Pain is worse after a nap or in the morning. The pain is 7/10. He has noticed it more after surgery. Has been going on at least a month, since the surgery last month. He can't sleep it is so painful and wakes him. Symmetric on both arms. But he also describes a diffuse and achy feeling in his arms as a whole.   I offered him an emg/ncs with me on 7/29 but he is in so much pain he would greatly appreciate a sooner emg/ncs appointment. I have tried to squeeze him into my open slots or create an open slot but it my schedule doesn't seem to work out with nerve conduction tech schedule.   Terrence Dupont - can you squeeze him in with Dr. Apolonio Schneiders or [place]? Or anyone who can do an emg/ncs of the bilateral uppers before the 29th? Sater usually can add an appointment if asked. Dr. Jannifer [place] is always willing to add an appointment as well.   Please let him know thanks. Otherwise I can see him on the 29th anytime at or after 10am. Will cc Dr. Wolfgang Phoenix as an fyi.   Vivien Rota

## 2014-12-04 NOTE — Discharge Instructions (Signed)
Stop taking Mevacor (lovastatin). It may be causing your pain.  Hydromorphone tablets What is this medicine? HYDROMORPHONE (hye droe MOR fone) is a pain reliever. It is used to treat moderate to severe pain. This medicine may be used for other purposes; ask your health care provider or pharmacist if you have questions. COMMON BRAND NAME(S): Dilaudid What should I tell my health care provider before I take this medicine? They need to know if you have any of these conditions: -brain tumor -drug abuse or addiction -head injury -heart disease -frequently drink alcohol containing drinks -kidney disease or problems going to the bathroom -liver disease -lung disease, asthma, or breathing problems -mental problems -an allergic or unusual reaction to lactose, hydromorphone, other opioid analgesics, other medicines, sulfites, foods, dyes, or preservatives -pregnant or trying to get pregnant -breast-feeding How should I use this medicine? Take this medicine by mouth with a glass of water. If the medicine upsets your stomach, take it with food or milk. Follow the directions on the prescription label. Do not take more medicine than you are told to take. Talk to your pediatrician regarding the use of this medicine in children. Special care may be needed. Overdosage: If you think you have taken too much of this medicine contact a poison control center or emergency room at once. NOTE: This medicine is only for you. Do not share this medicine with others. What if I miss a dose? If you miss a dose, take it as soon as you can. If it is almost time for your next dose, take only that dose. Do not take double or extra doses. What may interact with this medicine? -alcohol -antihistamines for allergy, cough and cold -medicines for anesthesia -medicines for depression, anxiety, or psychotic disturbances -medicines for sleep -muscle relaxants -naltrexone -narcotic medicines (opiates) for  pain -phenothiazines like chlorpromazine, mesoridazine, prochlorperazine, thioridazine -tramadol This list may not describe all possible interactions. Give your health care provider a list of all the medicines, herbs, non-prescription drugs, or dietary supplements you use. Also tell them if you smoke, drink alcohol, or use illegal drugs. Some items may interact with your medicine. What should I watch for while using this medicine? Tell your doctor or health care professional if your pain does not go away, if it gets worse, or if you have new or a different type of pain. You may develop tolerance to the medicine. Tolerance means that you will need a higher dose of the medicine for pain relief. Tolerance is normal and is expected if you take this medicine for a long time. Do not suddenly stop taking your medicine because you may develop a severe reaction. Your body becomes used to the medicine. This does NOT mean you are addicted. Addiction is a behavior related to getting and using a drug for a non-medical reason. If you have pain, you have a medical reason to take pain medicine. Your doctor will tell you how much medicine to take. If your doctor wants you to stop the medicine, the dose will be slowly lowered over time to avoid any side effects. You may get drowsy or dizzy. Do not drive, use machinery, or do anything that needs mental alertness until you know how this medicine affects you. Do not stand or sit up quickly, especially if you are an older patient. This reduces the risk of dizzy or fainting spells. Alcohol may interfere with the effect of this medicine. Avoid alcoholic drinks. There are different types of narcotic medicines (opiates) for pain. If  you take more than one type at the same time, you may have more side effects. Give your health care provider a list of all medicines you use. Your doctor will tell you how much medicine to take. Do not take more medicine than directed. Call emergency for  help if you have problems breathing. This medicine will cause constipation. Try to have a bowel movement at least every 2 to 3 days. If you do not have a bowel movement for 3 days, call your doctor or health care professional. Your mouth may get dry. Chewing sugarless gum or sucking hard candy, and drinking plenty of water may help. Contact your doctor if the problem does not go away or is severe. What side effects may I notice from receiving this medicine? Side effects that you should report to your doctor or health care professional as soon as possible: -allergic reactions like skin rash, itching or hives, swelling of the face, lips, or tongue -breathing problems -changes in vision -confusion -feeling faint or lightheaded, falls -seizures -slow or fast heartbeat -trouble passing urine or change in the amount of urine -trouble with balance, talking, walking -unusually weak or tired Side effects that usually do not require medical attention (report to your doctor or health care professional if they continue or are bothersome): -difficulty sleeping -drowsiness -dry mouth -flushing -headache -itching -loss of appetite -nausea, vomiting This list may not describe all possible side effects. Call your doctor for medical advice about side effects. You may report side effects to FDA at 1-800-FDA-1088. Where should I keep my medicine? Keep out of the reach of children. This medicine can be abused. Keep your medicine in a safe place to protect it from theft. Do not share this medicine with anyone. Selling or giving away this medicine is dangerous and against the law. Store at room temperature between 15 and 30 degrees C (59 and 86 degrees F). Keep container tightly closed. Protect from light. This medicine may cause accidental overdose and death if it is taken by other adults, children, or pets. Flush any unused medicine down the toilet to reduce the chance of harm. Do not use the medicine after  the expiration date. NOTE: This sheet is a summary. It may not cover all possible information. If you have questions about this medicine, talk to your doctor, pharmacist, or health care provider.  2015, [employer]. (2012-12-05 09:50:15)

## 2014-12-04 NOTE — ED Provider Notes (Signed)
CSN: [id number]     Arrival date & time 12/04/14  0207 History   First MD Initiated Contact with Patient 12/04/14 [phone]     Chief Complaint  Patient presents with  . Arm Pain     (Consider location/radiation/quality/duration/timing/severity/associated sxs/prior Treatment) Patient is a 74 y.o. male presenting with arm pain. The history is provided by the patient.  Arm Pain  He is about one month postop lumbar fusion surgery. For about the last 10 days, he has been having severe, achy pain in both arms. That pain is primarily on the ulnar side of the forearms and involves entire upper arms. He rates pain at 10/10. He is also noted some progressive swelling of his feet. He denies weakness, numbness, tingling. He has been taking Percocet and ibuprofen which gave only partial relief of pain. He denies any bowel or bladder dysfunction. He has no complaints relating to his back surgery. He denies fever, chills, sweats. There has been no dyspnea. There has been no nausea or vomiting.  Past Medical History  Diagnosis Date  . AAA (abdominal aortic aneurysm)     needs yearly ultrasound  . Hypertension   . COPD (chronic obstructive pulmonary disease)   . Hypercholesterolemia   . MI (myocardial infarction) 1999  . BPH (benign prostatic hyperplasia)   . GERD (gastroesophageal reflux disease)   . Arthritis   . Glaucoma   . Impaired fasting glucose   . Allergy   . Asthma   . CAD (coronary artery disease)   . HOH (hard of hearing)   . Cancer     skin cancer  . Anemia   . Dysrhythmia     pt. states it can be fast at times  . Low back pain   . Thrush    Past Surgical History  Procedure Laterality Date  . Cardiac catheterization      angioplasty  . Back surgery      x 3  . Right eye detached retina Bilateral   . Cholecystectomy  2000  . Cataract extraction w/phaco  03/20/2012    Procedure: CATARACT EXTRACTION PHACO AND INTRAOCULAR LENS PLACEMENT (IOC);  Surgeon: Williams Che, MD;   Location: [facility];  Service: Ophthalmology;  Laterality: Right;  CDE:  8.45  . Colonoscopy  2009    repeat 5 years  . Cataract extraction w/phaco Left 04/02/2013    Procedure: CATARACT EXTRACTION PHACO AND INTRAOCULAR LENS PLACEMENT (IOC);  Surgeon: Williams Che, MD;  Location: [facility];  Service: Ophthalmology;  Laterality: Left;  CDE:  6.50  . Laparoscopic partial colectomy N/A 06/11/2013    Procedure: LAPAROSCOPIC HAND ASSISTED PARTIAL COLECTOMY;  Surgeon: Jamesetta So, MD;  Location: [facility];  Service: General;  Laterality: N/A;  . Yag laser application Left 07/37/1062    Procedure: YAG LASER APPLICATION;  Surgeon: Williams Che, MD;  Location: [facility];  Service: Ophthalmology;  Laterality: Left;  . Esophagogastroduodenoscopy    . Hernia repair Left     inguinal   Family History  Problem Relation Age of Onset  . Hypertension Mother   . COPD Father   . Cancer Brother     brain   History  Substance Use Topics  . Smoking status: Former Smoker -- 1.00 packs/day for 35 years    Types: Cigarettes    Quit date: 03/28/2003  . Smokeless tobacco: Not on file  . Alcohol Use: No    Review of Systems  All other systems reviewed and are negative.  Allergies  Cefzil; Dexamethasone; Methocarbamol; Neomycin; Tetracyclines & related; Ciprofloxacin; and Penicillins  Home Medications   Prior to Admission medications   Medication Sig Start Date End Date Taking? Authorizing Provider  albuterol (PROVENTIL HFA;VENTOLIN HFA) 108 (90 BASE) MCG/ACT inhaler Inhale 2 puffs into the lungs every 6 (six) hours as needed for wheezing. 01/30/14   Mikey Kirschner, MD  albuterol (PROVENTIL) (2.5 MG/3ML) 0.083% nebulizer solution INHALE 1 VIAL VIA NEBULIZER FOUR TIMES DAILY. Patient taking differently: INHALE 1 VIAL VIA NEBULIZER ONE TO TWO TIMES DAILY AS NEEDED 10/24/14   Mikey Kirschner, MD  Alum & Mag Hydroxide-Simeth (MAGIC MOUTHWASH) SOLN Take 5 mLs by mouth 4 (four) times daily as needed for  mouth pain. 11/11/14   Francine Graven, DO  cetirizine (ZYRTEC) 10 MG tablet Take 10 mg by mouth daily.    Historical Provider, MD  diazepam (VALIUM) 5 MG tablet Take 1 tablet (5 mg total) by mouth every 6 (six) hours as needed for muscle spasms. 11/05/14   Consuella Lose, MD  esomeprazole (NEXIUM) 40 MG capsule Take 40 mg by mouth daily before breakfast.      Historical Provider, MD  furosemide (LASIX) 20 MG tablet Take 1 tablet (20 mg total) by mouth daily. 12/03/14   Kathyrn Drown, MD  HYDROcodone-acetaminophen (NORCO) 7.5-325 MG per tablet Take 1 tablet by mouth every 4 (four) hours as needed. 12/03/14   Kathyrn Drown, MD  lovastatin (MEVACOR) 40 MG tablet TAKE 2 TABLETS BY MOUTH DAILY. Patient taking differently: TAKE 1 TABLET BY MOUTH DAILY. 07/31/14   Mikey Kirschner, MD  Meth-Hyo-M Barnett Hatter Phos-Ph Sal (URIBEL PO) Take by mouth. One q 6 prn    Historical Provider, MD  montelukast (SINGULAIR) 10 MG tablet TAKE ONE TABLET BY MOUTH AT BEDTIME AS NEEDED FOR ALLERGIES. 07/22/14   Mikey Kirschner, MD  nitroGLYCERIN (NITROSTAT) 0.4 MG SL tablet Place 1 tablet (0.4 mg total) under the tongue every 5 (five) minutes as needed. 11/04/10   Josue Hector, MD  oxyCODONE-acetaminophen (PERCOCET/ROXICET) 5-325 MG per tablet Take 1 tablet by mouth every 6 (six) hours as needed for severe pain.    Historical Provider, MD  potassium chloride SA (K-DUR,KLOR-CON) 20 MEQ tablet Take 1 tablet (20 mEq total) by mouth daily. 12/03/14   Kathyrn Drown, MD  pregabalin (LYRICA) 50 MG capsule Take 50 mg by mouth 3 (three) times daily.    Historical Provider, MD  Q-DRYL 12.5 MG/5ML liquid  11/12/14   Historical Provider, MD  Sulfacetamide Sodium-Sulfur 10-5 % FOAM Apply 1 application topically daily as needed (rosazea).    Historical Provider, MD   BP 152/93 mmHg  Pulse 100  Temp(Src) 98 F (36.7 C)  Resp 18  Ht 5\' 10"  (1.778 m)  Wt 211 lb (95.709 kg)  BMI 30.28 kg/m2  SpO2 94% Physical Exam  Nursing note and  vitals reviewed.  75 year old male, resting comfortably and in no acute distress. Vital signs are are significant for hypertension. Oxygen saturation is 94%, which is normal. Head is normocephalic and atraumatic. PERRLA, EOMI. Oropharynx is clear. Neck is nontender and supple without adenopathy or JVD. Back: Lumbar braces in place and not removed. There is no presacral edema. Lungs are clear without rales, wheezes, or rhonchi. Chest is nontender. Heart has regular rate and rhythm without murmur. Abdomen is soft, flat, nontender without masses or hepatosplenomegaly and peristalsis is normoactive. Extremities have 3+ edema, full range of motion is present. Skin is warm and  dry without rash. Neurologic: Mental status is normal, cranial nerves are intact, there are no motor or sensory deficits.  ED Course  Procedures (including critical care time) Labs Review Results for orders placed or performed during the hospital encounter of 12/04/14  Comprehensive metabolic panel  Result Value Ref Range   Sodium 141 135 - 145 mmol/L   Potassium 3.7 3.5 - 5.1 mmol/L   Chloride 104 101 - 111 mmol/L   CO2 29 22 - 32 mmol/L   Glucose, Bld 122 (H) 65 - 99 mg/dL   BUN 17 6 - 20 mg/dL   Creatinine, Ser 0.86 0.61 - 1.24 mg/dL   Calcium 8.7 (L) 8.9 - 10.3 mg/dL   Total Protein 7.1 6.5 - 8.1 g/dL   Albumin 3.6 3.5 - 5.0 g/dL   AST 21 15 - 41 U/L   ALT 17 17 - 63 U/L   Alkaline Phosphatase 82 38 - 126 U/L   Total Bilirubin 0.3 0.3 - 1.2 mg/dL   GFR calc non Af Amer >60 >60 mL/min   GFR calc Af Amer >60 >60 mL/min   Anion gap 8 5 - 15  CBC with Differential  Result Value Ref Range   WBC 6.8 4.0 - 10.5 K/uL   RBC 3.75 (L) 4.22 - 5.81 MIL/uL   Hemoglobin 10.3 (L) 13.0 - 17.0 g/dL   HCT 33.2 (L) 39.0 - 52.0 %   MCV 88.5 78.0 - 100.0 fL   MCH 27.5 26.0 - 34.0 pg   MCHC 31.0 30.0 - 36.0 g/dL   RDW 14.4 11.5 - 15.5 %   Platelets 241 150 - 400 K/uL   Neutrophils Relative % 49 43 - 77 %   Neutro Abs  3.4 1.7 - 7.7 K/uL   Lymphocytes Relative 30 12 - 46 %   Lymphs Abs 2.0 0.7 - 4.0 K/uL   Monocytes Relative 7 3 - 12 %   Monocytes Absolute 0.5 0.1 - 1.0 K/uL   Eosinophils Relative 13 (H) 0 - 5 %   Eosinophils Absolute 0.9 (H) 0.0 - 0.7 K/uL   Basophils Relative 1 0 - 1 %   Basophils Absolute 0.0 0.0 - 0.1 K/uL  CK  Result Value Ref Range   Total CK 132 49 - 397 U/L  Urinalysis, Routine w reflex microscopic (not at [facility])  Result Value Ref Range   Color, Urine GREEN (A) YELLOW   APPearance CLEAR CLEAR   Specific Gravity, Urine 1.025 1.005 - 1.030   pH 7.0 5.0 - 8.0   Glucose, UA NEGATIVE NEGATIVE mg/dL   Hgb urine dipstick TRACE (A) NEGATIVE   Bilirubin Urine NEGATIVE NEGATIVE   Ketones, ur NEGATIVE NEGATIVE mg/dL   Protein, ur TRACE (A) NEGATIVE mg/dL   Urobilinogen, UA 0.2 0.0 - 1.0 mg/dL   Nitrite NEGATIVE NEGATIVE   Leukocytes, UA NEGATIVE NEGATIVE  Urine microscopic-add on  Result Value Ref Range   WBC, UA 0-2 <3 WBC/hpf   RBC / HPF 0-2 <3 RBC/hpf   Bacteria, UA FEW (A) RARE   MDM   Final diagnoses:  Myalgia  Normochromic normocytic anemia    Bilateral arm pain of uncertain cause. It is noted that he takes a statin, so CK level will be checked. Progressive edema of uncertain cause. On review of old records, it was noted that he had a preop cardiology evaluation which showed no edema. Office visit yesterday is reported to show normally 1+ edema. Screening labs will be obtained including renal function and albumen  and urinalysis will be checked. It is noted that he did have an episode of urinary retention with UTI in the postoperative phase.  Laboratory workup is unremarkable including normal CK. Normochromic anemia is present and unchanged from baseline. At this point, cause for his pain is unclear. Will try empirically to stop his statin to see if there is benefit. He did receive hydromorphone in the ED which initially gave no relief but repeat dose gave fairly good  relief of pain. Pulled a prescription for hydromorphone for pain control and is referred back to his PCP for follow-up. Return to the ED if symptoms worsen, or pain is not being adequately controlled.  Delora Fuel, MD 77/[id number]/36 [id number]

## 2014-12-04 NOTE — Addendum Note (Signed)
Addended by: Dairl Ponder on: 12/04/2014 04:32 PM   Modules accepted: Orders

## 2014-12-04 NOTE — ED Notes (Signed)
Discharge instructions given, pt demonstrated teach back and verbal understanding. No concerns voiced.  

## 2014-12-05 ENCOUNTER — Emergency Department (HOSPITAL_COMMUNITY)
Admission: EM | Admit: 2014-12-05 | Discharge: 2014-12-05 | Disposition: A | Discharge status: Home / Self Care | Payer: Medicare Other | Attending: Emergency Medicine | Admitting: Emergency Medicine

## 2014-12-05 ENCOUNTER — Emergency Department (HOSPITAL_COMMUNITY): Payer: Medicare Other

## 2014-12-05 ENCOUNTER — Encounter (HOSPITAL_COMMUNITY): Payer: Self-pay | Admitting: Emergency Medicine

## 2014-12-05 DIAGNOSIS — J449 Chronic obstructive pulmonary disease, unspecified: Secondary | ICD-10-CM | POA: Diagnosis not present

## 2014-12-05 DIAGNOSIS — Z87438 Personal history of other diseases of male genital organs: Secondary | ICD-10-CM | POA: Diagnosis not present

## 2014-12-05 DIAGNOSIS — M5126 Other intervertebral disc displacement, lumbar region: Secondary | ICD-10-CM | POA: Diagnosis not present

## 2014-12-05 DIAGNOSIS — Z880 Allergy status to penicillin: Secondary | ICD-10-CM | POA: Diagnosis not present

## 2014-12-05 DIAGNOSIS — R2242 Localized swelling, mass and lump, left lower limb: Secondary | ICD-10-CM | POA: Insufficient documentation

## 2014-12-05 DIAGNOSIS — M791 Myalgia: Secondary | ICD-10-CM | POA: Diagnosis not present

## 2014-12-05 DIAGNOSIS — I8311 Varicose veins of right lower extremity with inflammation: Secondary | ICD-10-CM

## 2014-12-05 DIAGNOSIS — M2578 Osteophyte, vertebrae: Secondary | ICD-10-CM | POA: Diagnosis not present

## 2014-12-05 DIAGNOSIS — R531 Weakness: Secondary | ICD-10-CM | POA: Diagnosis not present

## 2014-12-05 DIAGNOSIS — K219 Gastro-esophageal reflux disease without esophagitis: Secondary | ICD-10-CM | POA: Insufficient documentation

## 2014-12-05 DIAGNOSIS — M9973 Connective tissue and disc stenosis of intervertebral foramina of lumbar region: Secondary | ICD-10-CM | POA: Diagnosis not present

## 2014-12-05 DIAGNOSIS — Z79899 Other long term (current) drug therapy: Secondary | ICD-10-CM | POA: Insufficient documentation

## 2014-12-05 DIAGNOSIS — I2510 Atherosclerotic heart disease of native coronary artery without angina pectoris: Secondary | ICD-10-CM | POA: Insufficient documentation

## 2014-12-05 DIAGNOSIS — G4700 Insomnia, unspecified: Secondary | ICD-10-CM | POA: Diagnosis not present

## 2014-12-05 DIAGNOSIS — Z9889 Other specified postprocedural states: Secondary | ICD-10-CM

## 2014-12-05 DIAGNOSIS — R2241 Localized swelling, mass and lump, right lower limb: Secondary | ICD-10-CM | POA: Insufficient documentation

## 2014-12-05 DIAGNOSIS — M792 Neuralgia and neuritis, unspecified: Secondary | ICD-10-CM

## 2014-12-05 DIAGNOSIS — I872 Venous insufficiency (chronic) (peripheral): Secondary | ICD-10-CM

## 2014-12-05 DIAGNOSIS — Z87891 Personal history of nicotine dependence: Secondary | ICD-10-CM | POA: Insufficient documentation

## 2014-12-05 DIAGNOSIS — I10 Essential (primary) hypertension: Secondary | ICD-10-CM | POA: Diagnosis not present

## 2014-12-05 DIAGNOSIS — I252 Old myocardial infarction: Secondary | ICD-10-CM | POA: Diagnosis not present

## 2014-12-05 DIAGNOSIS — E780 Pure hypercholesterolemia: Secondary | ICD-10-CM | POA: Insufficient documentation

## 2014-12-05 DIAGNOSIS — M47816 Spondylosis without myelopathy or radiculopathy, lumbar region: Secondary | ICD-10-CM | POA: Diagnosis not present

## 2014-12-05 DIAGNOSIS — Z862 Personal history of diseases of the blood and blood-forming organs and certain disorders involving the immune mechanism: Secondary | ICD-10-CM | POA: Insufficient documentation

## 2014-12-05 DIAGNOSIS — I8312 Varicose veins of left lower extremity with inflammation: Secondary | ICD-10-CM

## 2014-12-05 DIAGNOSIS — M7989 Other specified soft tissue disorders: Secondary | ICD-10-CM

## 2014-12-05 DIAGNOSIS — Z8619 Personal history of other infectious and parasitic diseases: Secondary | ICD-10-CM | POA: Insufficient documentation

## 2014-12-05 DIAGNOSIS — R52 Pain, unspecified: Secondary | ICD-10-CM | POA: Insufficient documentation

## 2014-12-05 DIAGNOSIS — Z85828 Personal history of other malignant neoplasm of skin: Secondary | ICD-10-CM | POA: Insufficient documentation

## 2014-12-05 DIAGNOSIS — R238 Other skin changes: Secondary | ICD-10-CM | POA: Insufficient documentation

## 2014-12-05 DIAGNOSIS — H9190 Unspecified hearing loss, unspecified ear: Secondary | ICD-10-CM | POA: Diagnosis not present

## 2014-12-05 DIAGNOSIS — M47814 Spondylosis without myelopathy or radiculopathy, thoracic region: Secondary | ICD-10-CM | POA: Diagnosis not present

## 2014-12-05 DIAGNOSIS — M4802 Spinal stenosis, cervical region: Secondary | ICD-10-CM | POA: Diagnosis not present

## 2014-12-05 DIAGNOSIS — M9971 Connective tissue and disc stenosis of intervertebral foramina of cervical region: Secondary | ICD-10-CM | POA: Diagnosis not present

## 2014-12-05 DIAGNOSIS — M79605 Pain in left leg: Secondary | ICD-10-CM | POA: Diagnosis not present

## 2014-12-05 DIAGNOSIS — M47812 Spondylosis without myelopathy or radiculopathy, cervical region: Secondary | ICD-10-CM | POA: Diagnosis not present

## 2014-12-05 DIAGNOSIS — M4806 Spinal stenosis, lumbar region: Secondary | ICD-10-CM | POA: Diagnosis not present

## 2014-12-05 DIAGNOSIS — M1990 Unspecified osteoarthritis, unspecified site: Secondary | ICD-10-CM | POA: Insufficient documentation

## 2014-12-05 DIAGNOSIS — M79604 Pain in right leg: Secondary | ICD-10-CM | POA: Diagnosis not present

## 2014-12-05 LAB — CBC WITH DIFFERENTIAL/PLATELET
Basophils Absolute: 0 10*3/uL (ref 0.0–0.1)
Basophils Relative: 1 % (ref 0–1)
Eosinophils Absolute: 0.8 10*3/uL — ABNORMAL HIGH (ref 0.0–0.7)
Eosinophils Relative: 10 % — ABNORMAL HIGH (ref 0–5)
HCT: 33.9 % — ABNORMAL LOW (ref 39.0–52.0)
Hemoglobin: 10.5 g/dL — ABNORMAL LOW (ref 13.0–17.0)
Lymphocytes Relative: 20 % (ref 12–46)
Lymphs Abs: 1.5 10*3/uL (ref 0.7–4.0)
MCH: 27.7 pg (ref 26.0–34.0)
MCHC: 31 g/dL (ref 30.0–36.0)
MCV: 89.4 fL (ref 78.0–100.0)
Monocytes Absolute: 0.8 10*3/uL (ref 0.1–1.0)
Monocytes Relative: 10 % (ref 3–12)
Neutro Abs: 4.6 10*3/uL (ref 1.7–7.7)
Neutrophils Relative %: 59 % (ref 43–77)
Platelets: 234 10*3/uL (ref 150–400)
RBC: 3.79 MIL/uL — ABNORMAL LOW (ref 4.22–5.81)
RDW: 14.4 % (ref 11.5–15.5)
WBC: 7.8 10*3/uL (ref 4.0–10.5)

## 2014-12-05 LAB — BASIC METABOLIC PANEL
Anion gap: 9 (ref 5–15)
BUN: 15 mg/dL (ref 6–20)
CO2: 29 mmol/L (ref 22–32)
Calcium: 8.7 mg/dL — ABNORMAL LOW (ref 8.9–10.3)
Chloride: 101 mmol/L (ref 101–111)
Creatinine, Ser: 0.77 mg/dL (ref 0.61–1.24)
GFR calc Af Amer: >60 mL/min (ref >60)
GFR calc non Af Amer: >60 mL/min (ref >60)
Glucose, Bld: 104 mg/dL — ABNORMAL HIGH (ref 65–99)
Potassium: 4.1 mmol/L (ref 3.5–5.1)
Sodium: 139 mmol/L (ref 135–145)

## 2014-12-05 LAB — LACTIC ACID, PLASMA
Lactic Acid, Venous: 0.8 mmol/L (ref 0.5–2.0)
Lactic Acid, Venous: 0.8 mmol/L (ref 0.5–2.0)

## 2014-12-05 MED ORDER — HYDROMORPHONE HCL 1 MG/ML IJ SOLN: 1.0000 mg | Freq: Once | INTRAMUSCULAR | Status: DC

## 2014-12-05 MED ORDER — HYDROXYZINE HCL 25 MG PO TABS: 25.0000 mg | ORAL_TABLET | Freq: Every evening | ORAL | Status: DC | PRN

## 2014-12-05 MED ORDER — GADOBENATE DIMEGLUMINE 529 MG/ML IV SOLN
20.0000 mL | Freq: Once | INTRAVENOUS | Status: AC | PRN
  Administered 2014-12-05: 20 mL via INTRAVENOUS

## 2014-12-05 MED ORDER — FENTANYL CITRATE (PF) 100 MCG/2ML IJ SOLN
50.0000 ug | Freq: Once | INTRAMUSCULAR | Status: AC
  Administered 2014-12-05: 50 ug via INTRAVENOUS
  Filled 2014-12-05: qty 2

## 2014-12-05 MED ORDER — DEXTROSE 5 % IV SOLN
1.0000 g | Freq: Once | INTRAVENOUS | Status: AC
  Administered 2014-12-05: 1 g via INTRAVENOUS
  Filled 2014-12-05: qty 10

## 2014-12-05 MED ORDER — GABAPENTIN 100 MG PO CAPS: 100.0000 mg | ORAL_CAPSULE | Freq: Three times a day (TID) | ORAL | Status: DC

## 2014-12-05 MED ORDER — DIAZEPAM 5 MG/ML IJ SOLN
5.0000 mg | Freq: Once | INTRAMUSCULAR | Status: AC
  Administered 2014-12-05: 5 mg via INTRAVENOUS
  Filled 2014-12-05: qty 2

## 2014-12-05 MED ORDER — SODIUM CHLORIDE 0.9 % IJ SOLN
INTRAMUSCULAR | Status: AC
  Filled 2014-12-05: qty 15

## 2014-12-05 MED ORDER — KETOROLAC TROMETHAMINE 30 MG/ML IJ SOLN
30.0000 mg | Freq: Once | INTRAMUSCULAR | Status: AC
  Administered 2014-12-05: 30 mg via INTRAVENOUS
  Filled 2014-12-05: qty 1

## 2014-12-05 NOTE — Telephone Encounter (Signed)
Pt put on [place] schedule for [facility] at 8:30am on 6/29 and 10am for Dr. Jaynee Eagles for EMG.

## 2014-12-05 NOTE — H&P (Signed)
[facility] Consult Note  [name]:[id number] DOB: Sep 15, 1940 DOA: 12/05/2014  Referring physician: PA [place] PCP: Mickie Hillier, MD   Chief Complaint: UE pain adn LE swelling  HPI: [name] [name] is a 74 y.o. male  Asian with intermittent upper extremity pain over the last 5 days. Sometimes unilateral sometimes bilateral. This typically in the elbows to wrist and hands. Denies any associated injury. Of note patient had spinal surgery one month ago in the lumbar region. Of note patient also presenting with lower 70 swelling and redness bilaterally. This is been ongoing or since surgery. Denies chest pain, shortness of breath, palpitations, Donald pain, dysuria, frequency, nausea, vomiting, neck stiffness, headache. Patient has been on chronic narcotics which of late have included 2 Percocets every 4 hours for the week after surgery and one Percocet every 6 hours for the weeks leading up to his ED presentation. Patient has not taken any narcotics now in almost 24 hours. Of note patient awoke this morning complaining of joint pain all over.   Review of Systems:  Constitutional:  No weight loss, night sweats, Fevers, chills, fatigue.  HEENT:  No headaches, Difficulty swallowing,Tooth/dental problems,Sore throat,  No sneezing, itching, ear ache, nasal congestion, post nasal drip,  Cardio-vascular:  No chest pain, Orthopnea, PND, swelling in lower extremities, anasarca, dizziness, palpitations  GI:  No heartburn, indigestion, abdominal pain, nausea, vomiting, diarrhea, change in bowel habits, loss of appetite  Resp:   No shortness of breath with exertion or at rest. No excess mucus, no productive cough, No non-productive cough, No coughing up of blood.No change in color of mucus.No wheezing.No chest wall deformity  Skin:  bilat foot redness   GU:  no dysuria, change in color of urine, no urgency or frequency. No flank pain.  Musculoskeletal:  Per HPI Psych:   No change  in mood or affect. No depression or anxiety. No memory loss.   Past Medical History  Diagnosis Date  . AAA (abdominal aortic aneurysm)     needs yearly ultrasound  . Hypertension   . COPD (chronic obstructive pulmonary disease)   . Hypercholesterolemia   . MI (myocardial infarction) 1999  . BPH (benign prostatic hyperplasia)   . GERD (gastroesophageal reflux disease)   . Arthritis   . Glaucoma   . Impaired fasting glucose   . Allergy   . Asthma   . CAD (coronary artery disease)   . HOH (hard of hearing)   . Cancer     skin cancer  . Anemia   . Dysrhythmia     pt. states it can be fast at times  . Low back pain   . Thrush    Past Surgical History  Procedure Laterality Date  . Cardiac catheterization      angioplasty  . Back surgery      x 3  . Right eye detached retina Bilateral   . Cholecystectomy  2000  . Cataract extraction w/phaco  03/20/2012    Procedure: CATARACT EXTRACTION PHACO AND INTRAOCULAR LENS PLACEMENT (IOC);  Surgeon: Williams Che, MD;  Location: [facility];  Service: Ophthalmology;  Laterality: Right;  CDE:  8.45  . Colonoscopy  2009    repeat 5 years  . Cataract extraction w/phaco Left 04/02/2013    Procedure: CATARACT EXTRACTION PHACO AND INTRAOCULAR LENS PLACEMENT (IOC);  Surgeon: Williams Che, MD;  Location: [facility];  Service: Ophthalmology;  Laterality: Left;  CDE:  6.50  . Laparoscopic partial colectomy N/A 06/11/2013  Procedure: LAPAROSCOPIC HAND ASSISTED PARTIAL COLECTOMY;  Surgeon: Jamesetta So, MD;  Location: [facility];  Service: General;  Laterality: N/A;  . Yag laser application Left 66/10/3014    Procedure: YAG LASER APPLICATION;  Surgeon: Williams Che, MD;  Location: [facility];  Service: Ophthalmology;  Laterality: Left;  . Esophagogastroduodenoscopy    . Hernia repair Left     inguinal   Social History:  reports that he quit smoking about 11 years ago. His smoking use included Cigarettes. He has a 35 pack-year smoking history. He does  not have any smokeless tobacco history on file. He reports that he does not drink alcohol or use illicit drugs.  Allergies  Allergen Reactions  . Cefzil [Cefprozil] Nausea Only  . Dexamethasone Swelling  . Neomycin Other (See Comments)    Patient can't remember.  . Tetracyclines & Related Itching  . Ciprofloxacin Nausea And Vomiting and Rash  . Methocarbamol Swelling and Rash  . Penicillins Rash    Family History  Problem Relation Age of Onset  . Hypertension Mother   . COPD Father   . Cancer Brother     brain     Prior to Admission medications   Medication Sig Start Date End Date Taking? Authorizing Provider  albuterol (PROVENTIL HFA;VENTOLIN HFA) 108 (90 BASE) MCG/ACT inhaler Inhale 2 puffs into the lungs every 6 (six) hours as needed for wheezing. 01/30/14  Yes Mikey Kirschner, MD  albuterol (PROVENTIL) (2.5 MG/3ML) 0.083% nebulizer solution INHALE 1 VIAL VIA NEBULIZER FOUR TIMES DAILY. Patient taking differently: INHALE 1 VIAL VIA NEBULIZER ONE TO TWO TIMES DAILY AS NEEDED 10/24/14  Yes Mikey Kirschner, MD  Alum & Mag Hydroxide-Simeth (MAGIC MOUTHWASH) SOLN Take 5 mLs by mouth 4 (four) times daily as needed for mouth pain. 11/11/14  Yes Francine Graven, DO  cetirizine (ZYRTEC) 10 MG tablet Take 10 mg by mouth daily.   Yes Historical Provider, MD  diazepam (VALIUM) 5 MG tablet Take 1 tablet (5 mg total) by mouth every 6 (six) hours as needed for muscle spasms. 11/05/14  Yes Consuella Lose, MD  DiphenhydrAMINE HCl (BENADRYL ALLERGY PO) Take 2 tablets by mouth daily as needed (allergies).   Yes Historical Provider, MD  esomeprazole (NEXIUM) 40 MG capsule Take 40 mg by mouth daily before breakfast.     Yes Historical Provider, MD  furosemide (LASIX) 20 MG tablet Take 1 tablet (20 mg total) by mouth daily. 12/03/14  Yes Kathyrn Drown, MD  HYDROcodone-acetaminophen (NORCO) 7.5-325 MG per tablet Take 1 tablet by mouth every 4 (four) hours as needed. 12/03/14  Yes Kathyrn Drown, MD    HYDROmorphone (DILAUDID) 2 MG tablet Take 1-2 tablets (2-4 mg total) by mouth every 4 (four) hours as needed for severe pain. 0/10/93  Yes Delora Fuel, MD  lovastatin (MEVACOR) 40 MG tablet Take 40 mg by mouth. 09/30/14  Yes Historical Provider, MD  Meth-Hyo-M Bl-Na Phos-Ph Sal (URIBEL PO) Take by mouth. One q 6 prn   Yes Historical Provider, MD  montelukast (SINGULAIR) 10 MG tablet TAKE ONE TABLET BY MOUTH AT BEDTIME AS NEEDED FOR ALLERGIES. 07/22/14  Yes Mikey Kirschner, MD  nitroGLYCERIN (NITROSTAT) 0.4 MG SL tablet Place 1 tablet (0.4 mg total) under the tongue every 5 (five) minutes as needed. 11/04/10  Yes Josue Hector, MD  nystatin (MYCOSTATIN) 100000 UNIT/ML suspension Use as directed 5 mLs in the mouth or throat.  11/29/14  Yes Historical Provider, MD  oxyCODONE-acetaminophen (PERCOCET/ROXICET) 5-325 MG per  tablet Take 1 tablet by mouth every 6 (six) hours as needed for severe pain.   Yes Historical Provider, MD  potassium chloride SA (K-DUR,KLOR-CON) 20 MEQ tablet Take 1 tablet (20 mEq total) by mouth daily. 12/03/14  Yes Kathyrn Drown, MD   Physical Exam: Filed Vitals:   12/05/14 1330 12/05/14 1345 12/05/14 1400 12/05/14 1500  BP: 110/81  121/82 121/94  Pulse: 102 95  98  Temp:      TempSrc:      Resp:    20  Height:      Weight:      SpO2: 93% 91%  95%    Wt Readings from Last 3 Encounters:  12/05/14 95.255 kg (210 lb)  12/05/14 95.255 kg (210 lb)  12/05/14 95.255 kg (210 lb)    General:  Appears calm and comfortable Eyes:  PERRL, normal lids, irises & conjunctiva ENT:  grossly normal hearing, lips & tongue Neck:  no LAD, masses or thyromegaly Cardiovascular:  RRR, no m/r/g. 2+ bilateral lower chin edema from the mid leg to the feet.. Telemetry:  SR, no arrhythmias  Respiratory:  CTA bilaterally, no w/r/r. Normal respiratory effort. Abdomen:  soft, ntnd Skin:  Bilateral skin erythema from the ankles down to the toes. Skin is not indurated and only mildly intermittently  tender to palpation. Musculoskeletal: Back brace in place. Moves all extremities well. Psychiatric:  grossly normal mood and affect, speech fluent and appropriate Neurologic:  grossly non-focal.          Labs on Admission:  Basic Metabolic Panel:  Recent Labs Lab 12/03/14 1432 12/04/14 0235 12/05/14 0927  NA 141 141 139  K 4.2 3.7 4.1  CL 99 104 101  CO2 28 29 29   GLUCOSE 102* 122* 104*  BUN 14 17 15   CREATININE 0.91 0.86 0.77  CALCIUM 8.9 8.7* 8.7*   Liver Function Tests:  Recent Labs Lab 12/03/14 1432 12/04/14 0235  AST 17 21  ALT 16 17  ALKPHOS 87 82  BILITOT 0.3 0.3  PROT 6.5 7.1  ALBUMIN  --  3.6   No results for input(s): LIPASE, AMYLASE in the last 168 hours. No results for input(s): AMMONIA in the last 168 hours. CBC:  Recent Labs Lab 12/03/14 1432 12/04/14 0235 12/05/14 0927  WBC 6.0 6.8 7.8  NEUTROABS 3.2 3.4 4.6  HGB  --  10.3* 10.5*  HCT 33.3* 33.2* 33.9*  MCV  --  88.5 89.4  PLT  --  241 234   Cardiac Enzymes:  Recent Labs Lab 12/04/14 0235  CKTOTAL 132    BNP (last 3 results)  Recent Labs  12/03/14 1432  BNP 35.6    ProBNP (last 3 results) No results for input(s): PROBNP in the last 8760 hours.  CBG: No results for input(s): GLUCAP in the last 168 hours.  Radiological Exams on Admission: Mr Cervical Spine W Wo Contrast  12/05/2014   CLINICAL DATA:  74 year old male with bilateral extremity weakness and pain. Recent lumbar surgery. Query infection. Initial encounter.  EXAM: MRI CERVICAL SPINE WITHOUT AND WITH CONTRAST  MRI THORACIC SPINE WITHOUT AND WITH CONTRAST  MRI  LUMBAR SPINE WITHOUT AND WITH CONTRAST  TECHNIQUE: Multiplanar and multiecho pulse sequences of the cervical, thoracic and lumbar spine were obtained without and with intravenous contrast.  CONTRAST:  47mL MULTIHANCE GADOBENATE DIMEGLUMINE 529 MG/ML IV SOLN  COMPARISON:  [place] [facility] postoperative lumbar radiographs 11/25/2014. [facility]  intraoperative lumbar spine images 11/05/2014, lumbar MRI 09/06/2014.  Brain MRI  06/15/2012. Paranasal sinus CT 01/17/2009. Chest CTA 06/15/2006.  FINDINGS: MR CERVICAL SPINE FINDINGS  Chronic straightening and mild reversal of cervical lordosis, also evident on the scout view in 2010. Chronic cervical spine endplate degeneration. No abnormal enhancement identified. No marrow edema or evidence of acute osseous abnormality.  Cervicomedullary junction is within normal limits. Grossly negative visualized posterior fossa structures. No cervical spinal cord signal abnormality identified, T2 and STIR images are mildly degraded by motion.  Negative paraspinal soft tissues.  C2-C3: Mild to moderate facet hypertrophy greater on the left. Mild left C3 foraminal stenosis.  C3-C4: Circumferential disc osteophyte complex with central disc protrusion. Narrowing of the ventral CSF space without significant spinal stenosis. Uncovertebral hypertrophy greater on the left. Severe left and moderate right C4 foraminal stenosis.  C4-C5: Disc space loss with left eccentric circumferential disc osteophyte complex. Broad-based posterior component of disc. Spinal stenosis without definite spinal cord mass effect. Severe bilateral C5 foraminal stenosis.  C5-C6: Circumferential disc osteophyte complex with broad-based posterior component of disc. Spinal stenosis without cord mass effect. Uncovertebral hypertrophy. Moderate to severe bilateral C6 foraminal stenosis.  C6-C7: Disc space loss. Right eccentric circumferential disc osteophyte complex. Broad-based posterior component of disc. No spinal stenosis. Uncovertebral hypertrophy greater on the right. Severe right and moderate left C7 foraminal stenosis.  C7-T1: Mild right eccentric circumferential disc osteophyte complex. Mild facet hypertrophy. No significant stenosis.  MR THORACIC SPINE FINDINGS  The examination had to be discontinued prior to completion due to patient pain. Thoracic  post-contrast axial images were not obtained.  Normal thoracic vertebral height and alignment. No marrow edema or evidence of acute osseous abnormality.  Mild thoracic epidural lipomatosis. Thoracic spine thecal sac patency is within normal limits throughout. No thoracic spinal stenosis. Spinal cord signal is within normal limits at all visualized levels. The conus medullaris appears normal at T12-L1. No abnormal intradural enhancement.  Negative visualized posterior paraspinal soft tissues. Negative visualized thoracic viscera.  Normal for age thoracic intervertebral disc signal and morphology. No thoracic disc herniation. There is intermittent thoracic facet hypertrophy, maximal in the lower thoracic spine at T10-T11 and T11-T12 and greater on the left. No significant thoracic foraminal stenosis.  MR LUMBAR SPINE FINDINGS  Normal lumbar segmentation demonstrated on radiographs, same numbering system used today. Sequelae of L5-S1 posterior and interbody fusion plus decompression demonstrated on the recent radiographs. Mild hardware susceptibility artifact today. Multilevel chronic spondylolisthesis in the lumbar spine, stable since April. Multilevel chronic lumbar endplate degeneration, also appears stable. No acute osseous abnormality identified.  Visualized lower thoracic spinal cord is normal with conus medularis at T12-L1.  Infrarenal abdominal aortic aneurysm is more visible on today images (series 6, image 7) measuring up to 39 mm diameter at the L4 level. Otherwise negative visualized abdominal viscera.  Postoperative changes to the lower lumbar paraspinal soft tissues. Postoperative fluid collection in the decompression space, measuring up to 25 mm CC, but this collection appears stable and exerts no mass effect on the lumbar thecal sac. No unexpected postcontrast enhancement.  L1-L2: Stable mild disc bulge and small central annular fissure. No stenosis.  L2-L3: Stable chronic disc and endplate degeneration  with circumferential disc osteophyte complex. Moderate facet and ligament flavum hypertrophy is stable. Up to mild spinal and lateral recess stenosis is stable. No significant foraminal stenosis.  L3-L4: Chronic severe disc space loss. Chronic postoperative changes to the left posterior elements. Chronic bulky left eccentric disc osteophyte complex. Stable borderline to mild residual spinal stenosis. Moderate to severe bilateral L3  foraminal stenosis primarily due to osseous foraminal narrowing is stable.  L4-L5: Chronic circumferential disc bulge. Moderate facet and ligament flavum hypertrophy is stable. Still, owing to the adjacent posterior element decompression, thecal sac patency here appears moderately improved since April (series 4, image 33 today). Moderate left and mild to moderate right L4 foraminal stenosis appears not significantly changed.  L5-S1: Interval decompression and fusion. Improved thecal sac patency. Enhancing epidural granulation tissue. Bilateral L5 foraminal patency appears stable to improved. The right S1 pedicle screw may passed in proximity to the descending right S1 nerve root as demonstrated on series 6, image 5, but this is uncertain due to the degree of susceptibility artifact.  IMPRESSION: CERVICAL SPINE:  1. 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 Spinal stenosis with no definite cord mass effect at C4-C5 and C5-C6. 2. Multifactorial moderate or severe cervical foraminal stenosis at the bilateral C4, C5, C6, and C7 nerve levels.  THORACIC SPINE:  Negative for age thoracic spine.  LUMBAR SPINE:  1. Postoperative changes at L5-S1 with improved thecal sac patency, and stable to improved L5 foraminal patency. L4-L5 thecal sac patency also appears improved while up to moderate L4 foraminal stenosis has not significantly changed. 2. Questionable positioning of the right S1 pedicle screw in proximity to the descending right S1 nerve root. If there is right S1  radiculitis, recommend noncontrast lumbar spine CT or limited L5-S1 CT follow-up. 3. Infrarenal abdominal aortic aneurysm with diameter up to approximately 39 mm. Recommend followup by aorta Ultrasound in 2 years. This recommendation follows ACR consensus guidelines: White Paper of the ACR Incidental Findings Committee II on Vascular Findings. J Am Coll Radiol 2013; 10:789-794.   Electronically Signed   By: Genevie Ann M.D.   On: 12/05/2014 12:45   Mr Thoracic Spine W Wo Contrast  12/05/2014   CLINICAL DATA:  74 year old male with bilateral extremity weakness and pain. Recent lumbar surgery. Query infection. Initial encounter.  EXAM: MRI CERVICAL SPINE WITHOUT AND WITH CONTRAST  MRI THORACIC SPINE WITHOUT AND WITH CONTRAST  MRI  LUMBAR SPINE WITHOUT AND WITH CONTRAST  TECHNIQUE: Multiplanar and multiecho pulse sequences of the cervical, thoracic and lumbar spine were obtained without and with intravenous contrast.  CONTRAST:  72mL MULTIHANCE GADOBENATE DIMEGLUMINE 529 MG/ML IV SOLN  COMPARISON:  [place] [facility] postoperative lumbar radiographs 11/25/2014. [facility] intraoperative lumbar spine images 11/05/2014, lumbar MRI 09/06/2014.  Brain MRI 06/15/2012. Paranasal sinus CT 01/17/2009. Chest CTA 06/15/2006.  FINDINGS: MR CERVICAL SPINE FINDINGS  Chronic straightening and mild reversal of cervical lordosis, also evident on the scout view in 2010. Chronic cervical spine endplate degeneration. No abnormal enhancement identified. No marrow edema or evidence of acute osseous abnormality.  Cervicomedullary junction is within normal limits. Grossly negative visualized posterior fossa structures. No cervical spinal cord signal abnormality identified, T2 and STIR images are mildly degraded by motion.  Negative paraspinal soft tissues.  C2-C3: Mild to moderate facet hypertrophy greater on the left. Mild left C3 foraminal stenosis.  C3-C4: Circumferential disc osteophyte complex with central disc protrusion.  Narrowing of the ventral CSF space without significant spinal stenosis. Uncovertebral hypertrophy greater on the left. Severe left and moderate right C4 foraminal stenosis.  C4-C5: Disc space loss with left eccentric circumferential disc osteophyte complex. Broad-based posterior component of disc. Spinal stenosis without definite spinal cord mass effect. Severe bilateral C5 foraminal stenosis.  C5-C6: Circumferential disc osteophyte complex with broad-based posterior component of disc. Spinal stenosis without cord mass effect. Uncovertebral hypertrophy. Moderate to severe  bilateral C6 foraminal stenosis.  C6-C7: Disc space loss. Right eccentric circumferential disc osteophyte complex. Broad-based posterior component of disc. No spinal stenosis. Uncovertebral hypertrophy greater on the right. Severe right and moderate left C7 foraminal stenosis.  C7-T1: Mild right eccentric circumferential disc osteophyte complex. Mild facet hypertrophy. No significant stenosis.  MR THORACIC SPINE FINDINGS  The examination had to be discontinued prior to completion due to patient pain. Thoracic post-contrast axial images were not obtained.  Normal thoracic vertebral height and alignment. No marrow edema or evidence of acute osseous abnormality.  Mild thoracic epidural lipomatosis. Thoracic spine thecal sac patency is within normal limits throughout. No thoracic spinal stenosis. Spinal cord signal is within normal limits at all visualized levels. The conus medullaris appears normal at T12-L1. No abnormal intradural enhancement.  Negative visualized posterior paraspinal soft tissues. Negative visualized thoracic viscera.  Normal for age thoracic intervertebral disc signal and morphology. No thoracic disc herniation. There is intermittent thoracic facet hypertrophy, maximal in the lower thoracic spine at T10-T11 and T11-T12 and greater on the left. No significant thoracic foraminal stenosis.  MR LUMBAR SPINE FINDINGS  Normal lumbar  segmentation demonstrated on radiographs, same numbering system used today. Sequelae of L5-S1 posterior and interbody fusion plus decompression demonstrated on the recent radiographs. Mild hardware susceptibility artifact today. Multilevel chronic spondylolisthesis in the lumbar spine, stable since April. Multilevel chronic lumbar endplate degeneration, also appears stable. No acute osseous abnormality identified.  Visualized lower thoracic spinal cord is normal with conus medularis at T12-L1.  Infrarenal abdominal aortic aneurysm is more visible on today images (series 6, image 7) measuring up to 39 mm diameter at the L4 level. Otherwise negative visualized abdominal viscera.  Postoperative changes to the lower lumbar paraspinal soft tissues. Postoperative fluid collection in the decompression space, measuring up to 25 mm CC, but this collection appears stable and exerts no mass effect on the lumbar thecal sac. No unexpected postcontrast enhancement.  L1-L2: Stable mild disc bulge and small central annular fissure. No stenosis.  L2-L3: Stable chronic disc and endplate degeneration with circumferential disc osteophyte complex. Moderate facet and ligament flavum hypertrophy is stable. Up to mild spinal and lateral recess stenosis is stable. No significant foraminal stenosis.  L3-L4: Chronic severe disc space loss. Chronic postoperative changes to the left posterior elements. Chronic bulky left eccentric disc osteophyte complex. Stable borderline to mild residual spinal stenosis. Moderate to severe bilateral L3 foraminal stenosis primarily due to osseous foraminal narrowing is stable.  L4-L5: Chronic circumferential disc bulge. Moderate facet and ligament flavum hypertrophy is stable. Still, owing to the adjacent posterior element decompression, thecal sac patency here appears moderately improved since April (series 4, image 33 today). Moderate left and mild to moderate right L4 foraminal stenosis appears not  significantly changed.  L5-S1: Interval decompression and fusion. Improved thecal sac patency. Enhancing epidural granulation tissue. Bilateral L5 foraminal patency appears stable to improved. The right S1 pedicle screw may passed in proximity to the descending right S1 nerve root as demonstrated on series 6, image 5, but this is uncertain due to the degree of susceptibility artifact.  IMPRESSION: CERVICAL SPINE:  1. 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 Spinal stenosis with no definite cord mass effect at C4-C5 and C5-C6. 2. Multifactorial moderate or severe cervical foraminal stenosis at the bilateral C4, C5, C6, and C7 nerve levels.  THORACIC SPINE:  Negative for age thoracic spine.  LUMBAR SPINE:  1. Postoperative changes at L5-S1 with improved thecal sac patency, and stable to improved  L5 foraminal patency. L4-L5 thecal sac patency also appears improved while up to moderate L4 foraminal stenosis has not significantly changed. 2. Questionable positioning of the right S1 pedicle screw in proximity to the descending right S1 nerve root. If there is right S1 radiculitis, recommend noncontrast lumbar spine CT or limited L5-S1 CT follow-up. 3. Infrarenal abdominal aortic aneurysm with diameter up to approximately 39 mm. Recommend followup by aorta Ultrasound in 2 years. This recommendation follows ACR consensus guidelines: White Paper of the ACR Incidental Findings Committee II on Vascular Findings. J Am Coll Radiol 2013; 10:789-794.   Electronically Signed   By: Genevie Ann M.D.   On: 12/05/2014 12:45   Mr Lumbar Spine W Wo Contrast  12/05/2014   CLINICAL DATA:  74 year old male with bilateral extremity weakness and pain. Recent lumbar surgery. Query infection. Initial encounter.  EXAM: MRI CERVICAL SPINE WITHOUT AND WITH CONTRAST  MRI THORACIC SPINE WITHOUT AND WITH CONTRAST  MRI  LUMBAR SPINE WITHOUT AND WITH CONTRAST  TECHNIQUE: Multiplanar and multiecho pulse sequences of the cervical,  thoracic and lumbar spine were obtained without and with intravenous contrast.  CONTRAST:  35mL MULTIHANCE GADOBENATE DIMEGLUMINE 529 MG/ML IV SOLN  COMPARISON:  [place] [facility] postoperative lumbar radiographs 11/25/2014. [facility] intraoperative lumbar spine images 11/05/2014, lumbar MRI 09/06/2014.  Brain MRI 06/15/2012. Paranasal sinus CT 01/17/2009. Chest CTA 06/15/2006.  FINDINGS: MR CERVICAL SPINE FINDINGS  Chronic straightening and mild reversal of cervical lordosis, also evident on the scout view in 2010. Chronic cervical spine endplate degeneration. No abnormal enhancement identified. No marrow edema or evidence of acute osseous abnormality.  Cervicomedullary junction is within normal limits. Grossly negative visualized posterior fossa structures. No cervical spinal cord signal abnormality identified, T2 and STIR images are mildly degraded by motion.  Negative paraspinal soft tissues.  C2-C3: Mild to moderate facet hypertrophy greater on the left. Mild left C3 foraminal stenosis.  C3-C4: Circumferential disc osteophyte complex with central disc protrusion. Narrowing of the ventral CSF space without significant spinal stenosis. Uncovertebral hypertrophy greater on the left. Severe left and moderate right C4 foraminal stenosis.  C4-C5: Disc space loss with left eccentric circumferential disc osteophyte complex. Broad-based posterior component of disc. Spinal stenosis without definite spinal cord mass effect. Severe bilateral C5 foraminal stenosis.  C5-C6: Circumferential disc osteophyte complex with broad-based posterior component of disc. Spinal stenosis without cord mass effect. Uncovertebral hypertrophy. Moderate to severe bilateral C6 foraminal stenosis.  C6-C7: Disc space loss. Right eccentric circumferential disc osteophyte complex. Broad-based posterior component of disc. No spinal stenosis. Uncovertebral hypertrophy greater on the right. Severe right and moderate left C7 foraminal  stenosis.  C7-T1: Mild right eccentric circumferential disc osteophyte complex. Mild facet hypertrophy. No significant stenosis.  MR THORACIC SPINE FINDINGS  The examination had to be discontinued prior to completion due to patient pain. Thoracic post-contrast axial images were not obtained.  Normal thoracic vertebral height and alignment. No marrow edema or evidence of acute osseous abnormality.  Mild thoracic epidural lipomatosis. Thoracic spine thecal sac patency is within normal limits throughout. No thoracic spinal stenosis. Spinal cord signal is within normal limits at all visualized levels. The conus medullaris appears normal at T12-L1. No abnormal intradural enhancement.  Negative visualized posterior paraspinal soft tissues. Negative visualized thoracic viscera.  Normal for age thoracic intervertebral disc signal and morphology. No thoracic disc herniation. There is intermittent thoracic facet hypertrophy, maximal in the lower thoracic spine at T10-T11 and T11-T12 and greater on the left. No significant thoracic foraminal stenosis.  MR LUMBAR SPINE FINDINGS  Normal lumbar segmentation demonstrated on radiographs, same numbering system used today. Sequelae of L5-S1 posterior and interbody fusion plus decompression demonstrated on the recent radiographs. Mild hardware susceptibility artifact today. Multilevel chronic spondylolisthesis in the lumbar spine, stable since April. Multilevel chronic lumbar endplate degeneration, also appears stable. No acute osseous abnormality identified.  Visualized lower thoracic spinal cord is normal with conus medularis at T12-L1.  Infrarenal abdominal aortic aneurysm is more visible on today images (series 6, image 7) measuring up to 39 mm diameter at the L4 level. Otherwise negative visualized abdominal viscera.  Postoperative changes to the lower lumbar paraspinal soft tissues. Postoperative fluid collection in the decompression space, measuring up to 25 mm CC, but this  collection appears stable and exerts no mass effect on the lumbar thecal sac. No unexpected postcontrast enhancement.  L1-L2: Stable mild disc bulge and small central annular fissure. No stenosis.  L2-L3: Stable chronic disc and endplate degeneration with circumferential disc osteophyte complex. Moderate facet and ligament flavum hypertrophy is stable. Up to mild spinal and lateral recess stenosis is stable. No significant foraminal stenosis.  L3-L4: Chronic severe disc space loss. Chronic postoperative changes to the left posterior elements. Chronic bulky left eccentric disc osteophyte complex. Stable borderline to mild residual spinal stenosis. Moderate to severe bilateral L3 foraminal stenosis primarily due to osseous foraminal narrowing is stable.  L4-L5: Chronic circumferential disc bulge. Moderate facet and ligament flavum hypertrophy is stable. Still, owing to the adjacent posterior element decompression, thecal sac patency here appears moderately improved since April (series 4, image 33 today). Moderate left and mild to moderate right L4 foraminal stenosis appears not significantly changed.  L5-S1: Interval decompression and fusion. Improved thecal sac patency. Enhancing epidural granulation tissue. Bilateral L5 foraminal patency appears stable to improved. The right S1 pedicle screw may passed in proximity to the descending right S1 nerve root as demonstrated on series 6, image 5, but this is uncertain due to the degree of susceptibility artifact.  IMPRESSION: CERVICAL SPINE:  1. 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 Spinal stenosis with no definite cord mass effect at C4-C5 and C5-C6. 2. Multifactorial moderate or severe cervical foraminal stenosis at the bilateral C4, C5, C6, and C7 nerve levels.  THORACIC SPINE:  Negative for age thoracic spine.  LUMBAR SPINE:  1. Postoperative changes at L5-S1 with improved thecal sac patency, and stable to improved L5 foraminal patency. L4-L5  thecal sac patency also appears improved while up to moderate L4 foraminal stenosis has not significantly changed. 2. Questionable positioning of the right S1 pedicle screw in proximity to the descending right S1 nerve root. If there is right S1 radiculitis, recommend noncontrast lumbar spine CT or limited L5-S1 CT follow-up. 3. Infrarenal abdominal aortic aneurysm with diameter up to approximately 39 mm. Recommend followup by aorta Ultrasound in 2 years. This recommendation follows ACR consensus guidelines: White Paper of the ACR Incidental Findings Committee II on Vascular Findings. J Am Coll Radiol 2013; 10:789-794.   Electronically Signed   By: Genevie Ann M.D.   On: 12/05/2014 12:45     Assessment/Plan Active Problems:   Leg swelling   Neuropathic pain   S/P spinal surgery   Venous stasis dermatitis   Insomnia    Called to ED to evaluate patient for possible cellulitis, and upper extremity pain. Patient has had thorough workup here in the ED to include a full spinal MRI which does not show any new acute process such as abscess or nerve  root impingement. Discussed case with patient's neurosurgical team who has graciously agreed to see patient in the coming days to week. Patient to call for follow-up. Counseled patient to continue his narcotics at this time and to start Neurontin or Lyrica which he has both of it home. I suspect that some of the patient's upper extremity pain is neuropathic in nature which may be due to opioid withdrawal and hyperalgesia from chronic ongoing opioid use. With regards to patient's lower extremity tenderness and redness of this is fairly typical for patient to have new onset lower extremity edema. Patient received 1 dose of ceftriaxone in the ED and may continue anabolic course if ED team feels it is necessary. Patient has had lower extremity Dopplers in the recent past which is ruled out a DVT and has an ongoing workup pending through his primary care physician's office  to include an abdominal CT and further ultrasound imaging. I discussed patient's case with his primary care physician who has agreed to see the patient on 12/06/2014 at 2:30 PM. Of note patient should continue with his scheduled imaging exams tomorrow morning prior to his doctor's appointment. Patient states that he has has some difficulty sleeping at night due to his pains recommended that patient start some form of a sleep aid such as trazodone. This continued to be started now more discussed further with his primary care physician tomorrow. Patient is stable for discharge.    Family Communication: WIfe Disposition Plan: DC home  MERRELL, DAVID Lenna Sciara, MD Family Medicine [facility] [email or web address] Password [email or web address]

## 2014-12-05 NOTE — ED Notes (Signed)
PA Tammy Triplett at bedside.

## 2014-12-05 NOTE — ED Provider Notes (Signed)
CSN: [id number]     Arrival date & time 12/05/14  0815 History   First MD Initiated Contact with Patient 12/05/14 0830     Chief Complaint  Patient presents with  . Generalized Body Aches     (Consider location/radiation/quality/duration/timing/severity/associated sxs/prior Treatment) HPI   [name] [name] is a 74 y.o. male who is one month postop from lumbar fusion surgery.  He returns to ED for his second visit with complains of bilateral pain and burning sensation to his hands and arms with redness and swelling of both feet and lower legs.  Symptoms began approximately 5 days ago and he describes as constant and severe in quality.  This morning, he states he woke up with diffuse, pain "all over", and generalized weakness.  He states the pain continues in both arms and hands and he had difficulty standing and walking due to his level of pain.  He has been taking percocet and valium since his surgery and denies significant pain to his back at this time.  He states that he has been evaluated by his PMD and his neurosurgeon, Dr. Kathyrn Sheriff for this, but has not been given a diagnosis.  He denies chest pain, fever, chills, sweats, shortness of breath and vomiting.  He states his pain has not been relieved by the percocet.  He was prescribed Dilaudid tabs on his previous ED visit but has not gotten them filled.      Past Medical History  Diagnosis Date  . AAA (abdominal aortic aneurysm)     needs yearly ultrasound  . Hypertension   . COPD (chronic obstructive pulmonary disease)   . Hypercholesterolemia   . MI (myocardial infarction) 1999  . BPH (benign prostatic hyperplasia)   . GERD (gastroesophageal reflux disease)   . Arthritis   . Glaucoma   . Impaired fasting glucose   . Allergy   . Asthma   . CAD (coronary artery disease)   . HOH (hard of hearing)   . Cancer     skin cancer  . Anemia   . Dysrhythmia     pt. states it can be fast at times  . Low back pain   . Thrush    Past  Surgical History  Procedure Laterality Date  . Cardiac catheterization      angioplasty  . Back surgery      x 3  . Right eye detached retina Bilateral   . Cholecystectomy  2000  . Cataract extraction w/phaco  03/20/2012    Procedure: CATARACT EXTRACTION PHACO AND INTRAOCULAR LENS PLACEMENT (IOC);  Surgeon: Williams Che, MD;  Location: [facility];  Service: Ophthalmology;  Laterality: Right;  CDE:  8.45  . Colonoscopy  2009    repeat 5 years  . Cataract extraction w/phaco Left 04/02/2013    Procedure: CATARACT EXTRACTION PHACO AND INTRAOCULAR LENS PLACEMENT (IOC);  Surgeon: Williams Che, MD;  Location: [facility];  Service: Ophthalmology;  Laterality: Left;  CDE:  6.50  . Laparoscopic partial colectomy N/A 06/11/2013    Procedure: LAPAROSCOPIC HAND ASSISTED PARTIAL COLECTOMY;  Surgeon: Jamesetta So, MD;  Location: [facility];  Service: General;  Laterality: N/A;  . Yag laser application Left 27/78/2423    Procedure: YAG LASER APPLICATION;  Surgeon: Williams Che, MD;  Location: [facility];  Service: Ophthalmology;  Laterality: Left;  . Esophagogastroduodenoscopy    . Hernia repair Left     inguinal   Family History  Problem Relation Age of Onset  . Hypertension  Mother   . COPD Father   . Cancer Brother     brain   History  Substance Use Topics  . Smoking status: Former Smoker -- 1.00 packs/day for 35 years    Types: Cigarettes    Quit date: 03/28/2003  . Smokeless tobacco: Not on file  . Alcohol Use: No    Review of Systems  Constitutional: Positive for activity change. Negative for fever, chills and diaphoresis.  HENT: Negative for congestion, sore throat and trouble swallowing.   Eyes: Negative for visual disturbance.  Respiratory: Negative for chest tightness and shortness of breath.   Gastrointestinal: Negative for nausea, vomiting, abdominal pain and constipation.  Genitourinary: Negative for dysuria.  Musculoskeletal: Positive for myalgias. Negative for neck pain and  neck stiffness.  Skin: Positive for color change.       Redness both lower legs and feet  Neurological: Positive for weakness. Negative for dizziness, syncope, speech difficulty, numbness and headaches.  Psychiatric/Behavioral: Negative for confusion.  All other systems reviewed and are negative.     Allergies  Cefzil; Dexamethasone; Neomycin; Tetracyclines & related; Ciprofloxacin; Methocarbamol; and Penicillins  Home Medications   Prior to Admission medications   Medication Sig Start Date End Date Taking? Authorizing Provider  albuterol (PROVENTIL HFA;VENTOLIN HFA) 108 (90 BASE) MCG/ACT inhaler Inhale 2 puffs into the lungs every 6 (six) hours as needed for wheezing. 01/30/14  Yes Mikey Kirschner, MD  albuterol (PROVENTIL) (2.5 MG/3ML) 0.083% nebulizer solution INHALE 1 VIAL VIA NEBULIZER FOUR TIMES DAILY. Patient taking differently: INHALE 1 VIAL VIA NEBULIZER ONE TO TWO TIMES DAILY AS NEEDED 10/24/14  Yes Mikey Kirschner, MD  Alum & Mag Hydroxide-Simeth (MAGIC MOUTHWASH) SOLN Take 5 mLs by mouth 4 (four) times daily as needed for mouth pain. 11/11/14  Yes Francine Graven, DO  cetirizine (ZYRTEC) 10 MG tablet Take 10 mg by mouth daily.   Yes Historical Provider, MD  diazepam (VALIUM) 5 MG tablet Take 1 tablet (5 mg total) by mouth every 6 (six) hours as needed for muscle spasms. 11/05/14  Yes Consuella Lose, MD  DiphenhydrAMINE HCl (BENADRYL ALLERGY PO) Take 2 tablets by mouth daily as needed (allergies).   Yes Historical Provider, MD  esomeprazole (NEXIUM) 40 MG capsule Take 40 mg by mouth daily before breakfast.     Yes Historical Provider, MD  furosemide (LASIX) 20 MG tablet Take 1 tablet (20 mg total) by mouth daily. 12/03/14  Yes Kathyrn Drown, MD  HYDROcodone-acetaminophen (NORCO) 7.5-325 MG per tablet Take 1 tablet by mouth every 4 (four) hours as needed. 12/03/14  Yes Kathyrn Drown, MD  HYDROmorphone (DILAUDID) 2 MG tablet Take 1-2 tablets (2-4 mg total) by mouth every 4 (four)  hours as needed for severe pain. 2/84/13  Yes Delora Fuel, MD  lovastatin (MEVACOR) 40 MG tablet Take 40 mg by mouth. 09/30/14  Yes Historical Provider, MD  Meth-Hyo-M Bl-Na Phos-Ph Sal (URIBEL PO) Take by mouth. One q 6 prn   Yes Historical Provider, MD  montelukast (SINGULAIR) 10 MG tablet TAKE ONE TABLET BY MOUTH AT BEDTIME AS NEEDED FOR ALLERGIES. 07/22/14  Yes Mikey Kirschner, MD  nitroGLYCERIN (NITROSTAT) 0.4 MG SL tablet Place 1 tablet (0.4 mg total) under the tongue every 5 (five) minutes as needed. 11/04/10  Yes Josue Hector, MD  nystatin (MYCOSTATIN) 100000 UNIT/ML suspension Use as directed 5 mLs in the mouth or throat.  11/29/14  Yes Historical Provider, MD  oxyCODONE-acetaminophen (PERCOCET/ROXICET) 5-325 MG per tablet Take 1 tablet  by mouth every 6 (six) hours as needed for severe pain.   Yes Historical Provider, MD  potassium chloride SA (K-DUR,KLOR-CON) 20 MEQ tablet Take 1 tablet (20 mEq total) by mouth daily. 12/03/14  Yes Kathyrn Drown, MD   BP 128/88 mmHg  Pulse 106  Temp(Src) 97.8 F (36.6 C) (Oral)  Resp 22  Ht 6\' 1"  (1.854 m)  Wt 210 lb (95.255 kg)  BMI 27.71 kg/m2  SpO2 94% Physical Exam  Constitutional: He is oriented to person, place, and time. He appears well-developed and well-nourished. No distress.  HENT:  Head: Normocephalic and atraumatic.  Mouth/Throat: Oropharynx is clear and moist.  Eyes: EOM are normal. Pupils are equal, round, and reactive to light.  Neck: Normal range of motion. Neck supple. No JVD present.  Cardiovascular: Normal rate, regular rhythm and normal heart sounds.   No murmur heard. Pulmonary/Chest: Effort normal and breath sounds normal. No respiratory distress.  Abdominal: Soft. He exhibits no distension. There is no tenderness. There is no rebound and no guarding.  Musculoskeletal: Normal range of motion.  3+ pitting edema and erythema of the bilateral lower extremities.  DP pulses are strong and symmetrical.  Distal sensation intact.     Lymphadenopathy:    He has no cervical adenopathy.  Neurological: He is alert and oriented to person, place, and time. He has normal strength. No sensory deficit. He exhibits normal muscle tone. Coordination normal.  Skin: Skin is warm and dry.  Nursing note and vitals reviewed.   ED Course  Procedures (including critical care time) Labs Review Labs Reviewed  BASIC METABOLIC PANEL - Abnormal; Notable for the following:    Glucose, Bld 104 (*)    Calcium 8.7 (*)    All other components within normal limits  CBC WITH DIFFERENTIAL/PLATELET - Abnormal; Notable for the following:    RBC 3.79 (*)    Hemoglobin 10.5 (*)    HCT 33.9 (*)    Eosinophils Relative 10 (*)    Eosinophils Absolute 0.8 (*)    All other components within normal limits  CULTURE, BLOOD (ROUTINE X 2)  CULTURE, BLOOD (ROUTINE X 2)  LACTIC ACID, PLASMA  LACTIC ACID, PLASMA    Imaging Review Mr Cervical Spine W Wo Contrast  12/05/2014   CLINICAL DATA:  74 year old male with bilateral extremity weakness and pain. Recent lumbar surgery. Query infection. Initial encounter.  EXAM: MRI CERVICAL SPINE WITHOUT AND WITH CONTRAST  MRI THORACIC SPINE WITHOUT AND WITH CONTRAST  MRI  LUMBAR SPINE WITHOUT AND WITH CONTRAST  TECHNIQUE: Multiplanar and multiecho pulse sequences of the cervical, thoracic and lumbar spine were obtained without and with intravenous contrast.  CONTRAST:  47mL MULTIHANCE GADOBENATE DIMEGLUMINE 529 MG/ML IV SOLN  COMPARISON:  [facility] postoperative lumbar radiographs 11/25/2014. [facility] intraoperative lumbar spine images 11/05/2014, lumbar MRI 09/06/2014.  Brain MRI 06/15/2012. Paranasal sinus CT 01/17/2009. Chest CTA 06/15/2006.  FINDINGS: MR CERVICAL SPINE FINDINGS  Chronic straightening and mild reversal of cervical lordosis, also evident on the scout view in 2010. Chronic cervical spine endplate degeneration. No abnormal enhancement identified. No marrow edema or evidence of acute  osseous abnormality.  Cervicomedullary junction is within normal limits. Grossly negative visualized posterior fossa structures. No cervical spinal cord signal abnormality identified, T2 and STIR images are mildly degraded by motion.  Negative paraspinal soft tissues.  C2-C3: Mild to moderate facet hypertrophy greater on the left. Mild left C3 foraminal stenosis.  C3-C4: Circumferential disc osteophyte complex with central disc protrusion. Narrowing  of the ventral CSF space without significant spinal stenosis. Uncovertebral hypertrophy greater on the left. Severe left and moderate right C4 foraminal stenosis.  C4-C5: Disc space loss with left eccentric circumferential disc osteophyte complex. Broad-based posterior component of disc. Spinal stenosis without definite spinal cord mass effect. Severe bilateral C5 foraminal stenosis.  C5-C6: Circumferential disc osteophyte complex with broad-based posterior component of disc. Spinal stenosis without cord mass effect. Uncovertebral hypertrophy. Moderate to severe bilateral C6 foraminal stenosis.  C6-C7: Disc space loss. Right eccentric circumferential disc osteophyte complex. Broad-based posterior component of disc. No spinal stenosis. Uncovertebral hypertrophy greater on the right. Severe right and moderate left C7 foraminal stenosis.  C7-T1: Mild right eccentric circumferential disc osteophyte complex. Mild facet hypertrophy. No significant stenosis.  MR THORACIC SPINE FINDINGS  The examination had to be discontinued prior to completion due to patient pain. Thoracic post-contrast axial images were not obtained.  Normal thoracic vertebral height and alignment. No marrow edema or evidence of acute osseous abnormality.  Mild thoracic epidural lipomatosis. Thoracic spine thecal sac patency is within normal limits throughout. No thoracic spinal stenosis. Spinal cord signal is within normal limits at all visualized levels. The conus medullaris appears normal at T12-L1. No  abnormal intradural enhancement.  Negative visualized posterior paraspinal soft tissues. Negative visualized thoracic viscera.  Normal for age thoracic intervertebral disc signal and morphology. No thoracic disc herniation. There is intermittent thoracic facet hypertrophy, maximal in the lower thoracic spine at T10-T11 and T11-T12 and greater on the left. No significant thoracic foraminal stenosis.  MR LUMBAR SPINE FINDINGS  Normal lumbar segmentation demonstrated on radiographs, same numbering system used today. Sequelae of L5-S1 posterior and interbody fusion plus decompression demonstrated on the recent radiographs. Mild hardware susceptibility artifact today. Multilevel chronic spondylolisthesis in the lumbar spine, stable since April. Multilevel chronic lumbar endplate degeneration, also appears stable. No acute osseous abnormality identified.  Visualized lower thoracic spinal cord is normal with conus medularis at T12-L1.  Infrarenal abdominal aortic aneurysm is more visible on today images (series 6, image 7) measuring up to 39 mm diameter at the L4 level. Otherwise negative visualized abdominal viscera.  Postoperative changes to the lower lumbar paraspinal soft tissues. Postoperative fluid collection in the decompression space, measuring up to 25 mm CC, but this collection appears stable and exerts no mass effect on the lumbar thecal sac. No unexpected postcontrast enhancement.  L1-L2: Stable mild disc bulge and small central annular fissure. No stenosis.  L2-L3: Stable chronic disc and endplate degeneration with circumferential disc osteophyte complex. Moderate facet and ligament flavum hypertrophy is stable. Up to mild spinal and lateral recess stenosis is stable. No significant foraminal stenosis.  L3-L4: Chronic severe disc space loss. Chronic postoperative changes to the left posterior elements. Chronic bulky left eccentric disc osteophyte complex. Stable borderline to mild residual spinal stenosis.  Moderate to severe bilateral L3 foraminal stenosis primarily due to osseous foraminal narrowing is stable.  L4-L5: Chronic circumferential disc bulge. Moderate facet and ligament flavum hypertrophy is stable. Still, owing to the adjacent posterior element decompression, thecal sac patency here appears moderately improved since April (series 4, image 33 today). Moderate left and mild to moderate right L4 foraminal stenosis appears not significantly changed.  L5-S1: Interval decompression and fusion. Improved thecal sac patency. Enhancing epidural granulation tissue. Bilateral L5 foraminal patency appears stable to improved. The right S1 pedicle screw may passed in proximity to the descending right S1 nerve root as demonstrated on series 6, image 5, but this is uncertain due  to the degree of susceptibility artifact.  IMPRESSION: CERVICAL SPINE:  1. 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 Spinal stenosis with no definite cord mass effect at C4-C5 and C5-C6. 2. Multifactorial moderate or severe cervical foraminal stenosis at the bilateral C4, C5, C6, and C7 nerve levels.  THORACIC SPINE:  Negative for age thoracic spine.  LUMBAR SPINE:  1. Postoperative changes at L5-S1 with improved thecal sac patency, and stable to improved L5 foraminal patency. L4-L5 thecal sac patency also appears improved while up to moderate L4 foraminal stenosis has not significantly changed. 2. Questionable positioning of the right S1 pedicle screw in proximity to the descending right S1 nerve root. If there is right S1 radiculitis, recommend noncontrast lumbar spine CT or limited L5-S1 CT follow-up. 3. Infrarenal abdominal aortic aneurysm with diameter up to approximately 39 mm. Recommend followup by aorta Ultrasound in 2 years. This recommendation follows ACR consensus guidelines: White Paper of the ACR Incidental Findings Committee II on Vascular Findings. J Am Coll Radiol 2013; 10:789-794.   Electronically Signed   By: Genevie Ann M.D.   On: 12/05/2014 12:45   Mr Thoracic Spine W Wo Contrast  12/05/2014   CLINICAL DATA:  74 year old male with bilateral extremity weakness and pain. Recent lumbar surgery. Query infection. Initial encounter.  EXAM: MRI CERVICAL SPINE WITHOUT AND WITH CONTRAST  MRI THORACIC SPINE WITHOUT AND WITH CONTRAST  MRI  LUMBAR SPINE WITHOUT AND WITH CONTRAST  TECHNIQUE: Multiplanar and multiecho pulse sequences of the cervical, thoracic and lumbar spine were obtained without and with intravenous contrast.  CONTRAST:  42mL MULTIHANCE GADOBENATE DIMEGLUMINE 529 MG/ML IV SOLN  COMPARISON:  [facility] postoperative lumbar radiographs 11/25/2014. [facility] intraoperative lumbar spine images 11/05/2014, lumbar MRI 09/06/2014.  Brain MRI 06/15/2012. Paranasal sinus CT 01/17/2009. Chest CTA 06/15/2006.  FINDINGS: MR CERVICAL SPINE FINDINGS  Chronic straightening and mild reversal of cervical lordosis, also evident on the scout view in 2010. Chronic cervical spine endplate degeneration. No abnormal enhancement identified. No marrow edema or evidence of acute osseous abnormality.  Cervicomedullary junction is within normal limits. Grossly negative visualized posterior fossa structures. No cervical spinal cord signal abnormality identified, T2 and STIR images are mildly degraded by motion.  Negative paraspinal soft tissues.  C2-C3: Mild to moderate facet hypertrophy greater on the left. Mild left C3 foraminal stenosis.  C3-C4: Circumferential disc osteophyte complex with central disc protrusion. Narrowing of the ventral CSF space without significant spinal stenosis. Uncovertebral hypertrophy greater on the left. Severe left and moderate right C4 foraminal stenosis.  C4-C5: Disc space loss with left eccentric circumferential disc osteophyte complex. Broad-based posterior component of disc. Spinal stenosis without definite spinal cord mass effect. Severe bilateral C5 foraminal stenosis.  C5-C6:  Circumferential disc osteophyte complex with broad-based posterior component of disc. Spinal stenosis without cord mass effect. Uncovertebral hypertrophy. Moderate to severe bilateral C6 foraminal stenosis.  C6-C7: Disc space loss. Right eccentric circumferential disc osteophyte complex. Broad-based posterior component of disc. No spinal stenosis. Uncovertebral hypertrophy greater on the right. Severe right and moderate left C7 foraminal stenosis.  C7-T1: Mild right eccentric circumferential disc osteophyte complex. Mild facet hypertrophy. No significant stenosis.  MR THORACIC SPINE FINDINGS  The examination had to be discontinued prior to completion due to patient pain. Thoracic post-contrast axial images were not obtained.  Normal thoracic vertebral height and alignment. No marrow edema or evidence of acute osseous abnormality.  Mild thoracic epidural lipomatosis. Thoracic spine thecal sac patency is within normal limits throughout. No  thoracic spinal stenosis. Spinal cord signal is within normal limits at all visualized levels. The conus medullaris appears normal at T12-L1. No abnormal intradural enhancement.  Negative visualized posterior paraspinal soft tissues. Negative visualized thoracic viscera.  Normal for age thoracic intervertebral disc signal and morphology. No thoracic disc herniation. There is intermittent thoracic facet hypertrophy, maximal in the lower thoracic spine at T10-T11 and T11-T12 and greater on the left. No significant thoracic foraminal stenosis.  MR LUMBAR SPINE FINDINGS  Normal lumbar segmentation demonstrated on radiographs, same numbering system used today. Sequelae of L5-S1 posterior and interbody fusion plus decompression demonstrated on the recent radiographs. Mild hardware susceptibility artifact today. Multilevel chronic spondylolisthesis in the lumbar spine, stable since April. Multilevel chronic lumbar endplate degeneration, also appears stable. No acute osseous abnormality  identified.  Visualized lower thoracic spinal cord is normal with conus medularis at T12-L1.  Infrarenal abdominal aortic aneurysm is more visible on today images (series 6, image 7) measuring up to 39 mm diameter at the L4 level. Otherwise negative visualized abdominal viscera.  Postoperative changes to the lower lumbar paraspinal soft tissues. Postoperative fluid collection in the decompression space, measuring up to 25 mm CC, but this collection appears stable and exerts no mass effect on the lumbar thecal sac. No unexpected postcontrast enhancement.  L1-L2: Stable mild disc bulge and small central annular fissure. No stenosis.  L2-L3: Stable chronic disc and endplate degeneration with circumferential disc osteophyte complex. Moderate facet and ligament flavum hypertrophy is stable. Up to mild spinal and lateral recess stenosis is stable. No significant foraminal stenosis.  L3-L4: Chronic severe disc space loss. Chronic postoperative changes to the left posterior elements. Chronic bulky left eccentric disc osteophyte complex. Stable borderline to mild residual spinal stenosis. Moderate to severe bilateral L3 foraminal stenosis primarily due to osseous foraminal narrowing is stable.  L4-L5: Chronic circumferential disc bulge. Moderate facet and ligament flavum hypertrophy is stable. Still, owing to the adjacent posterior element decompression, thecal sac patency here appears moderately improved since April (series 4, image 33 today). Moderate left and mild to moderate right L4 foraminal stenosis appears not significantly changed.  L5-S1: Interval decompression and fusion. Improved thecal sac patency. Enhancing epidural granulation tissue. Bilateral L5 foraminal patency appears stable to improved. The right S1 pedicle screw may passed in proximity to the descending right S1 nerve root as demonstrated on series 6, image 5, but this is uncertain due to the degree of susceptibility artifact.  IMPRESSION: CERVICAL  SPINE:  1. 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 Spinal stenosis with no definite cord mass effect at C4-C5 and C5-C6. 2. Multifactorial moderate or severe cervical foraminal stenosis at the bilateral C4, C5, C6, and C7 nerve levels.  THORACIC SPINE:  Negative for age thoracic spine.  LUMBAR SPINE:  1. Postoperative changes at L5-S1 with improved thecal sac patency, and stable to improved L5 foraminal patency. L4-L5 thecal sac patency also appears improved while up to moderate L4 foraminal stenosis has not significantly changed. 2. Questionable positioning of the right S1 pedicle screw in proximity to the descending right S1 nerve root. If there is right S1 radiculitis, recommend noncontrast lumbar spine CT or limited L5-S1 CT follow-up. 3. Infrarenal abdominal aortic aneurysm with diameter up to approximately 39 mm. Recommend followup by aorta Ultrasound in 2 years. This recommendation follows ACR consensus guidelines: White Paper of the ACR Incidental Findings Committee II on Vascular Findings. J Am Coll Radiol 2013; 10:789-794.   Electronically Signed   By: Herminio Heads.D.  On: 12/05/2014 12:45   Mr Lumbar Spine W Wo Contrast  12/05/2014   CLINICAL DATA:  74 year old male with bilateral extremity weakness and pain. Recent lumbar surgery. Query infection. Initial encounter.  EXAM: MRI CERVICAL SPINE WITHOUT AND WITH CONTRAST  MRI THORACIC SPINE WITHOUT AND WITH CONTRAST  MRI  LUMBAR SPINE WITHOUT AND WITH CONTRAST  TECHNIQUE: Multiplanar and multiecho pulse sequences of the cervical, thoracic and lumbar spine were obtained without and with intravenous contrast.  CONTRAST:  46mL MULTIHANCE GADOBENATE DIMEGLUMINE 529 MG/ML IV SOLN  COMPARISON:  [facility] postoperative lumbar radiographs 11/25/2014. [facility] intraoperative lumbar spine images 11/05/2014, lumbar MRI 09/06/2014.  Brain MRI 06/15/2012. Paranasal sinus CT 01/17/2009. Chest CTA 06/15/2006.  FINDINGS: MR  CERVICAL SPINE FINDINGS  Chronic straightening and mild reversal of cervical lordosis, also evident on the scout view in 2010. Chronic cervical spine endplate degeneration. No abnormal enhancement identified. No marrow edema or evidence of acute osseous abnormality.  Cervicomedullary junction is within normal limits. Grossly negative visualized posterior fossa structures. No cervical spinal cord signal abnormality identified, T2 and STIR images are mildly degraded by motion.  Negative paraspinal soft tissues.  C2-C3: Mild to moderate facet hypertrophy greater on the left. Mild left C3 foraminal stenosis.  C3-C4: Circumferential disc osteophyte complex with central disc protrusion. Narrowing of the ventral CSF space without significant spinal stenosis. Uncovertebral hypertrophy greater on the left. Severe left and moderate right C4 foraminal stenosis.  C4-C5: Disc space loss with left eccentric circumferential disc osteophyte complex. Broad-based posterior component of disc. Spinal stenosis without definite spinal cord mass effect. Severe bilateral C5 foraminal stenosis.  C5-C6: Circumferential disc osteophyte complex with broad-based posterior component of disc. Spinal stenosis without cord mass effect. Uncovertebral hypertrophy. Moderate to severe bilateral C6 foraminal stenosis.  C6-C7: Disc space loss. Right eccentric circumferential disc osteophyte complex. Broad-based posterior component of disc. No spinal stenosis. Uncovertebral hypertrophy greater on the right. Severe right and moderate left C7 foraminal stenosis.  C7-T1: Mild right eccentric circumferential disc osteophyte complex. Mild facet hypertrophy. No significant stenosis.  MR THORACIC SPINE FINDINGS  The examination had to be discontinued prior to completion due to patient pain. Thoracic post-contrast axial images were not obtained.  Normal thoracic vertebral height and alignment. No marrow edema or evidence of acute osseous abnormality.  Mild  thoracic epidural lipomatosis. Thoracic spine thecal sac patency is within normal limits throughout. No thoracic spinal stenosis. Spinal cord signal is within normal limits at all visualized levels. The conus medullaris appears normal at T12-L1. No abnormal intradural enhancement.  Negative visualized posterior paraspinal soft tissues. Negative visualized thoracic viscera.  Normal for age thoracic intervertebral disc signal and morphology. No thoracic disc herniation. There is intermittent thoracic facet hypertrophy, maximal in the lower thoracic spine at T10-T11 and T11-T12 and greater on the left. No significant thoracic foraminal stenosis.  MR LUMBAR SPINE FINDINGS  Normal lumbar segmentation demonstrated on radiographs, same numbering system used today. Sequelae of L5-S1 posterior and interbody fusion plus decompression demonstrated on the recent radiographs. Mild hardware susceptibility artifact today. Multilevel chronic spondylolisthesis in the lumbar spine, stable since April. Multilevel chronic lumbar endplate degeneration, also appears stable. No acute osseous abnormality identified.  Visualized lower thoracic spinal cord is normal with conus medularis at T12-L1.  Infrarenal abdominal aortic aneurysm is more visible on today images (series 6, image 7) measuring up to 39 mm diameter at the L4 level. Otherwise negative visualized abdominal viscera.  Postoperative changes to the lower lumbar paraspinal soft tissues.  Postoperative fluid collection in the decompression space, measuring up to 25 mm CC, but this collection appears stable and exerts no mass effect on the lumbar thecal sac. No unexpected postcontrast enhancement.  L1-L2: Stable mild disc bulge and small central annular fissure. No stenosis.  L2-L3: Stable chronic disc and endplate degeneration with circumferential disc osteophyte complex. Moderate facet and ligament flavum hypertrophy is stable. Up to mild spinal and lateral recess stenosis is  stable. No significant foraminal stenosis.  L3-L4: Chronic severe disc space loss. Chronic postoperative changes to the left posterior elements. Chronic bulky left eccentric disc osteophyte complex. Stable borderline to mild residual spinal stenosis. Moderate to severe bilateral L3 foraminal stenosis primarily due to osseous foraminal narrowing is stable.  L4-L5: Chronic circumferential disc bulge. Moderate facet and ligament flavum hypertrophy is stable. Still, owing to the adjacent posterior element decompression, thecal sac patency here appears moderately improved since April (series 4, image 33 today). Moderate left and mild to moderate right L4 foraminal stenosis appears not significantly changed.  L5-S1: Interval decompression and fusion. Improved thecal sac patency. Enhancing epidural granulation tissue. Bilateral L5 foraminal patency appears stable to improved. The right S1 pedicle screw may passed in proximity to the descending right S1 nerve root as demonstrated on series 6, image 5, but this is uncertain due to the degree of susceptibility artifact.  IMPRESSION: CERVICAL SPINE:  1. 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 Spinal stenosis with no definite cord mass effect at C4-C5 and C5-C6. 2. Multifactorial moderate or severe cervical foraminal stenosis at the bilateral C4, C5, C6, and C7 nerve levels.  THORACIC SPINE:  Negative for age thoracic spine.  LUMBAR SPINE:  1. Postoperative changes at L5-S1 with improved thecal sac patency, and stable to improved L5 foraminal patency. L4-L5 thecal sac patency also appears improved while up to moderate L4 foraminal stenosis has not significantly changed. 2. Questionable positioning of the right S1 pedicle screw in proximity to the descending right S1 nerve root. If there is right S1 radiculitis, recommend noncontrast lumbar spine CT or limited L5-S1 CT follow-up. 3. Infrarenal abdominal aortic aneurysm with diameter up to approximately 39 mm.  Recommend followup by aorta Ultrasound in 2 years. This recommendation follows ACR consensus guidelines: White Paper of the ACR Incidental Findings Committee II on Vascular Findings. J Am Coll Radiol 2013; 10:789-794.   Electronically Signed   By: Genevie Ann M.D.   On: 12/05/2014 12:45     EKG Interpretation None      MDM   Final diagnoses:  Pain  Generalized pain    Pt is resting.  Remains non-toxic appearing.  Requesting oral fluids.  [facility], Dr. Cyndy Freeze and discussed findings, he agrees to review the MRI results and call back.    1430  Dr. Cyndy Freeze reviewed the films. No obvious findings to explain the patient's symptoms. Pt also seen by Dr. Jeanell Sparrow and care plan discussed.     1518  Additional pain medication ordered.  Consulted [facility] Dr. Marily Memos, who agrees to see pt in ED   1610  Dr. Marily Memos has seen the patient and feels that he is stable for d/c.  Pt has f/u appt with his PMD this week and agrees to also arrange f/u with his neurosurgeon.  Pt is agreeable to plan. I have written vistaril to aid in sleep and refilled pt's Neurontin.    Kem Parkinson, PA-C 12/06/14 1719  Pattricia Boss, MD 12/07/14 [phone]

## 2014-12-05 NOTE — ED Notes (Signed)
Pt requesting medication for increased pain. PA Tammy Triplett made aware.

## 2014-12-05 NOTE — Telephone Encounter (Signed)
Left VM for pt to let him know I was calling to get him scheduled for NCS/EMG. Only available time is Friday 11/13/14 at 10am. I checked with other providers and there is no sooner time, but if there is a cancellation we will let him know. Please let him know if he calls back, thank you!

## 2014-12-05 NOTE — ED Notes (Signed)
PA Kem Parkinson and MD Ray at bedside.

## 2014-12-05 NOTE — Addendum Note (Signed)
Addended by: Ofilia Neas R on: 12/05/2014 10:16 AM   Modules accepted: Orders

## 2014-12-05 NOTE — ED Notes (Addendum)
Pt reports body aches,leg swelling/redness, and right arm pain x5 days. Pt reports severe body aches and limited ROM since waking up this am around 630am. Pt reports had back surgery on June 21st. Pt reports Lbm X2 days ago. Pt reports has seen pcp for same with no known diagnosis. Pt denies any urinary, GI symptoms.

## 2014-12-06 ENCOUNTER — Ambulatory Visit (INDEPENDENT_AMBULATORY_CARE_PROVIDER_SITE_OTHER): Payer: Medicare Other | Admitting: Family Medicine

## 2014-12-06 ENCOUNTER — Ambulatory Visit (HOSPITAL_COMMUNITY): Payer: Medicare Other

## 2014-12-06 ENCOUNTER — Ambulatory Visit (HOSPITAL_COMMUNITY): Admission: RE | Admit: 2014-12-06 | Payer: Medicare Other | Source: Ambulatory Visit

## 2014-12-06 ENCOUNTER — Ambulatory Visit (HOSPITAL_COMMUNITY)
Admission: RE | Admit: 2014-12-06 | Discharge: 2014-12-06 | Disposition: A | Discharge status: Home / Self Care | Payer: Medicare Other | Source: Ambulatory Visit | Attending: Family Medicine | Admitting: Family Medicine

## 2014-12-06 ENCOUNTER — Encounter: Payer: Self-pay | Admitting: Family Medicine

## 2014-12-06 VITALS — BP 112/74 | HR 95 | Ht 73.0 in | Wt 211.0 lb

## 2014-12-06 DIAGNOSIS — R7989 Other specified abnormal findings of blood chemistry: Secondary | ICD-10-CM

## 2014-12-06 DIAGNOSIS — R600 Localized edema: Secondary | ICD-10-CM | POA: Insufficient documentation

## 2014-12-06 DIAGNOSIS — N410 Acute prostatitis: Secondary | ICD-10-CM | POA: Diagnosis not present

## 2014-12-06 DIAGNOSIS — D689 Coagulation defect, unspecified: Secondary | ICD-10-CM | POA: Diagnosis not present

## 2014-12-06 DIAGNOSIS — I2510 Atherosclerotic heart disease of native coronary artery without angina pectoris: Secondary | ICD-10-CM

## 2014-12-06 DIAGNOSIS — R791 Abnormal coagulation profile: Secondary | ICD-10-CM | POA: Diagnosis not present

## 2014-12-06 MED ORDER — SULFAMETHOXAZOLE-TRIMETHOPRIM 800-160 MG PO TABS: 1.0000 | ORAL_TABLET | Freq: Two times a day (BID) | ORAL | Status: DC

## 2014-12-06 NOTE — Progress Notes (Signed)
   Subjective:    Patient ID: [name] [name], male    DOB: 28-Jan-1941, 74 y.o.   MRN: [id number]  HPIpt seen here on 7/19. Pt went to ED on 7/20 and 7/20. Pt had MRI cervical, lumber, and thoracic at the hospital. Was suppose to go do CT scan today but pt states he was too sick to go to have scan done. Having muscle aches.  Pt states he is taking gabapentin, hydrocodone and dilaudid.   Had chills yesterday when he got home from hospital. Had to go outside to get warm three different times. Woke up sweating last night.   Rash on face. Pt has seen dermatology in the past for rosacea.     Review of Systems     Objective:   Physical Exam   lungs are clear hearts regular his arms have no rash muscles are nontender she states the pain is doing better in his arms extremities less edema he has knee-high support hose is but he does have tenderness in both calf   prostate exam moderately enlarged with tenderness    Assessment & Plan:   bilateral leg pain with swelling as well as elevated d-dimer had a normal ultrasound approximately week ago he needs a repeat ultrasound   I do not feel the patient needs CT scan of the abdomen any longer   the patient should follow-up in 2 weeks will see the neurologist next week.    I do believe this patient has mild prostatitis Bactrim DS twice a day for the next 3 weeks follow-up if high fevers or worse go to ER if worse  Patient was counseled to try to cut down on pain medication

## 2014-12-09 ENCOUNTER — Ambulatory Visit (INDEPENDENT_AMBULATORY_CARE_PROVIDER_SITE_OTHER): Payer: Medicare Other | Admitting: Family Medicine

## 2014-12-09 ENCOUNTER — Encounter: Payer: Self-pay | Admitting: Family Medicine

## 2014-12-09 VITALS — BP 100/80 | Ht 73.0 in | Wt 211.0 lb

## 2014-12-09 DIAGNOSIS — I2510 Atherosclerotic heart disease of native coronary artery without angina pectoris: Secondary | ICD-10-CM | POA: Diagnosis not present

## 2014-12-09 DIAGNOSIS — D508 Other iron deficiency anemias: Secondary | ICD-10-CM

## 2014-12-09 DIAGNOSIS — R600 Localized edema: Secondary | ICD-10-CM

## 2014-12-09 MED ORDER — FUROSEMIDE 20 MG PO TABS: ORAL_TABLET | ORAL | Status: DC

## 2014-12-09 MED ORDER — GABAPENTIN 100 MG PO CAPS: ORAL_CAPSULE | ORAL | Status: DC

## 2014-12-09 MED ORDER — HYDROMORPHONE HCL 2 MG PO TABS: 2.0000 mg | ORAL_TABLET | ORAL | Status: DC | PRN

## 2014-12-09 NOTE — Progress Notes (Signed)
   Subjective:    Patient ID: [name] [name], male    DOB: 1941-03-11, 74 y.o.   MRN: [id number]  HPI Patient is here today for a follow up visit on the edema in his lower extremities. Patient states that the swelling is still present. Patient states that he has pain in his hands also.  Patient has no other concerns at this time. The patient is supposed to see neurology later this week to have nerve conduction studies done to help diagnose what might be going on in his hands  Review of Systems    he denies any chest tightness pressure pain shortness breath nausea vomiting diarrhea his main complaint is swelling in the legs as well as arm pain and hand pain. Objective:   Physical Exam He still has 2+ pitting edema in his lower legs there is no tenderness in the calf no sinus a light [place] he also has missed subjective pain discomfort in the forearms and hands but his pulses are normal color is normal strength appears normal  Lungs are clear hearts regular.       Assessment & Plan:  Stop valium Increase gabapentin- use 2 capsules 3 times a day May use the hydromorph Hold off on the Vicodin for now May use ibuprofen 200mg - may take 4 tablets 3 times a day Increase Lasix- furosemide to 2 each am Compression stockings prescription strength Recheck next week  NCV with neurology later today

## 2014-12-09 NOTE — Patient Instructions (Addendum)
Stop valium Increase gabapentin- use 2 capsules 3 times a day May use the hydromorph Hold off on the Vicodin for now May use ibuprofen 200mg - may take 4 tablets 3 times a day Increase Lasix- furosemide to 2 each am Compression stockings prescription strength Recheck next week  Labs oon thursday

## 2014-12-10 LAB — CULTURE, BLOOD (ROUTINE X 2)
Culture: NO GROWTH
Culture: NO GROWTH

## 2014-12-11 DIAGNOSIS — D508 Other iron deficiency anemias: Secondary | ICD-10-CM | POA: Diagnosis not present

## 2014-12-11 DIAGNOSIS — R600 Localized edema: Secondary | ICD-10-CM | POA: Diagnosis not present

## 2014-12-12 LAB — BASIC METABOLIC PANEL
BUN/Creatinine Ratio: 14 (ref 10–22)
BUN: 13 mg/dL (ref 8–27)
CO2: 27 mmol/L (ref 18–29)
Calcium: 8.6 mg/dL (ref 8.6–10.2)
Chloride: 95 mmol/L — ABNORMAL LOW (ref 97–108)
Creatinine, Ser: 0.94 mg/dL (ref 0.76–1.27)
GFR calc Af Amer: 93 mL/min/{1.73_m2} (ref >59)
GFR calc non Af Amer: 80 mL/min/{1.73_m2} (ref >59)
Glucose: 99 mg/dL (ref 65–99)
Potassium: 4.4 mmol/L (ref 3.5–5.2)
Sodium: 138 mmol/L (ref 134–144)

## 2014-12-12 LAB — CBC WITH DIFFERENTIAL/PLATELET
Basophils Absolute: 0.1 10*3/uL (ref 0.0–0.2)
Basos: 1 %
EOS (ABSOLUTE): 0.5 10*3/uL — ABNORMAL HIGH (ref 0.0–0.4)
Eos: 7 %
Hematocrit: 31.8 % — ABNORMAL LOW (ref 37.5–51.0)
Hemoglobin: 10.1 g/dL — ABNORMAL LOW (ref 12.6–17.7)
Immature Grans (Abs): 0 10*3/uL (ref 0.0–0.1)
Immature Granulocytes: 0 %
Lymphocytes Absolute: 1.5 10*3/uL (ref 0.7–3.1)
Lymphs: 20 %
MCH: 26.9 pg (ref 26.6–33.0)
MCHC: 31.8 g/dL (ref 31.5–35.7)
MCV: 85 fL (ref 79–97)
Monocytes Absolute: 0.7 10*3/uL (ref 0.1–0.9)
Monocytes: 9 %
Neutrophils Absolute: 4.6 10*3/uL (ref 1.4–7.0)
Neutrophils: 63 %
Platelets: 263 10*3/uL (ref 150–379)
RBC: 3.75 x10E6/uL — ABNORMAL LOW (ref 4.14–5.80)
RDW: 14.7 % (ref 12.3–15.4)
WBC: 7.4 10*3/uL (ref 3.4–10.8)

## 2014-12-13 ENCOUNTER — Ambulatory Visit (INDEPENDENT_AMBULATORY_CARE_PROVIDER_SITE_OTHER): Payer: Medicare Other | Admitting: Family Medicine

## 2014-12-13 ENCOUNTER — Ambulatory Visit (INDEPENDENT_AMBULATORY_CARE_PROVIDER_SITE_OTHER): Payer: Medicare Other | Admitting: Neurology

## 2014-12-13 ENCOUNTER — Encounter: Payer: Self-pay | Admitting: Family Medicine

## 2014-12-13 ENCOUNTER — Ambulatory Visit (INDEPENDENT_AMBULATORY_CARE_PROVIDER_SITE_OTHER): Payer: Self-pay | Admitting: Neurology

## 2014-12-13 VITALS — BP 110/68 | Ht 73.0 in | Wt 209.6 lb

## 2014-12-13 DIAGNOSIS — R600 Localized edema: Secondary | ICD-10-CM | POA: Diagnosis not present

## 2014-12-13 DIAGNOSIS — G5621 Lesion of ulnar nerve, right upper limb: Secondary | ICD-10-CM

## 2014-12-13 DIAGNOSIS — R0600 Dyspnea, unspecified: Secondary | ICD-10-CM | POA: Diagnosis not present

## 2014-12-13 DIAGNOSIS — K219 Gastro-esophageal reflux disease without esophagitis: Secondary | ICD-10-CM

## 2014-12-13 DIAGNOSIS — E785 Hyperlipidemia, unspecified: Secondary | ICD-10-CM | POA: Diagnosis not present

## 2014-12-13 DIAGNOSIS — G5620 Lesion of ulnar nerve, unspecified upper limb: Secondary | ICD-10-CM

## 2014-12-13 DIAGNOSIS — G5622 Lesion of ulnar nerve, left upper limb: Secondary | ICD-10-CM

## 2014-12-13 DIAGNOSIS — G5602 Carpal tunnel syndrome, left upper limb: Secondary | ICD-10-CM

## 2014-12-13 DIAGNOSIS — G5623 Lesion of ulnar nerve, bilateral upper limbs: Secondary | ICD-10-CM

## 2014-12-13 DIAGNOSIS — I2510 Atherosclerotic heart disease of native coronary artery without angina pectoris: Secondary | ICD-10-CM | POA: Diagnosis not present

## 2014-12-13 DIAGNOSIS — N410 Acute prostatitis: Secondary | ICD-10-CM

## 2014-12-13 DIAGNOSIS — R350 Frequency of micturition: Secondary | ICD-10-CM

## 2014-12-13 DIAGNOSIS — Z0289 Encounter for other administrative examinations: Secondary | ICD-10-CM

## 2014-12-13 MED ORDER — METHYLPREDNISOLONE 4 MG PO TBPK: ORAL_TABLET | ORAL | Status: DC

## 2014-12-13 MED ORDER — HYDROMORPHONE HCL 2 MG PO TABS: 2.0000 mg | ORAL_TABLET | ORAL | Status: DC | PRN

## 2014-12-13 MED ORDER — PREDNISONE 10 MG PO TABS: ORAL_TABLET | ORAL | Status: DC

## 2014-12-13 MED ORDER — FUROSEMIDE 20 MG PO TABS: ORAL_TABLET | ORAL | Status: DC

## 2014-12-13 MED ORDER — TRIAMCINOLONE ACETONIDE 0.1 % EX CREA: 1.0000 "application " | TOPICAL_CREAM | Freq: Two times a day (BID) | CUTANEOUS | Status: DC

## 2014-12-13 NOTE — Progress Notes (Signed)
   Subjective:    Patient ID: [name] [name], male    DOB: 19-May-1940, 74 y.o.   MRN: [id number]  HPI Patient arrives for continued swelling in feet and legs for several weeks. Patient arrives office with protracted concerns.  Prior notes reviewed in great detail and presence of patient. Next  Suspension emergency room multiple times. Expansive experiencing bilateral arm and hand pain. Just had nerve conduction size A. Was told he had ulnar neuropathy. Start on sterile age.   Had back surgery. Subsequent substantial pain recent multiple MRIs which were negative. Next  Frustrated by ongoing illness expresses an extreme detail.  Both legs are swollen somewhat tender deftly read. Also at times itchy. See prior notes.  Swelling of the legs seems to worsen since starting Neurontin.   Review of Systems No headache no chest pain some shortness of breath with exertion no back pain    Objective:   Physical Exam Alert talkative mild distress somewhat grumpy. HEENT normal. Lungs clear heart regular rhythm ankles 1+ edema bilateral erythema slightly tender but not exquisitely so and rash present arms and hands strength intact.       Assessment & Plan:  Impression 1 older neuropathy bilateral with substantial pain discussed #2 status post neurosurgery ongoing pain discussed #3 venous stasis with substantial pain. Of note patient is been taking 2 Dilaudid to every 4 hours. #4 subacute to chronic pain encourage patient strongly to start back off from pain medicine regimen recommended. Plan 3 fluid pills daily for a week to daily for a week 1 daily after that. Recheck in several weeks. No antibodies triamcinolone twice a day. Artery on stairways. 35-40 minutes spent most in discussion WSL

## 2014-12-13 NOTE — Patient Instructions (Addendum)
As far as your medications are concerned, I would like to suggest: Prednisone 10mg  Day one: 6 in the morning Day two: 5 in the morning Day three: 4 in the morning Day four: 3 in the morning Day five: 2 in the morning Day six: 1 in the morning  As far as diagnostic testing:   My clinical assistant and will answer any of your questions and relay your messages to me and also relay most of my messages to you.   Our phone number is [phone]. We also have an after hours call service for urgent matters and there is a physician on-call for urgent questions. For any emergencies you know to call 911 or go to the nearest emergency room

## 2014-12-13 NOTE — Patient Instructions (Signed)
Knock down pretty quickly to one pain tab every four hrs then start cutting back the intervals

## 2014-12-13 NOTE — Progress Notes (Signed)
[facility]    Provider:  Dr Jaynee Eagles Referring Provider: Mikey Kirschner, MD Primary Care Physician:  Mickie Hillier, MD  HPI:  [name] [name] is a 74 y.o. male here as a referral from Dr. Wolfgang Phoenix for pain in the hands. This started at least a month ago. Patient reports significant pain and burning in the bilateral hands corresponding to an Ulnar distribution. He reports pain in volar and dorsal aspects of digits 4-5 of both hands as well as pain in the proximal volar medial hand and dorsal medial hand.  Denies any pain in the thumb and digits 1-2 of the hands. The pain is severe. He is constantly rubbing the hands together to try and alleviate the pain. He had symptoms in his hand like this many years ago and he remembers the sensory symptoms split right in the middle of one of his fingers and people told him that wasn't possible. He is taking narcotics every 4-6 hours for this pain and he never even did that after his surgery for his back. Neurontin made his legs swell. He has a bad habit of putting his elbows on the tables all the times and he notes that his elbows hurt and he has worn elbow pads in the past for his elbow pain. He also sleeps with his arms bent.   Summary:   Nerve Conduction Studies were performed on the bilateral upper extremities.  The right median APB motor nerve showed normal conductions  The right Median 2nd Digit sensory nerve was within normal limits. F Wave studies indicate that the right Median F wave has normal latency.  The left median APB motor nerve showed prolonged distal onset latency (4.5 ms, N<4.0). The left Median 2nd Digit sensory nerve showed prolonged distal peak latency (4.3 ms, N<3.9).  F Wave studies indicate that the left Median F wave has normal latency.  Bilateral Ulnar motor(recording at both the ADM and FDI) nerves were within normal limits. F Wave studies indicate that the bilateral Ulnar F waves have normal latencies The  bilateralUlnar 5th digit sensory nerves were within normal limits. The bilateral Radial sensory nerves were within normal limits.  The left median/ulnar (palm) comparison nerve showed prolonged distal peak latency (Median Palm, 2.3 ms, N<2.2) and abnormal peak latency difference (Median Palm-Ulnar Palm, 0.68ms, N<0.4) with a relative median delay.   EMG needle study of selected right-sided muscles was performed. The right Abductor Digiti Minimi showed markedly increased spontaneous acitivity (psw and fibs), increased motor unit amplitude and diminished motor unit recruitment. The First Dorsal Interosseous showed moderately increased spontaneous acitivity(psw and fibs) and diminished motor unit recruitment.The following muscles were normal: Deltoid, Triceps, Pronator Teres, Flexor Digitorum Profundus, Flexor Carpi Ulnaris, Opponens Pollicis muscles and O1/Y0 paraspinals.  Conclusion: This is an abnormal study.   1. There is acute/ongoing denervation in 2 right-sided distal ulnar-innervated intrinsic hand muscles(FDI and ADM). However, unable to localize the site of the Ulnar lesion.  Given sensory symptoms in the ventral and dorsal medial  fingers and proximal hands and pain in the elbows, it may be Ulnar nerve entrapment at the elbow. Although damage to the ulnar nerve most often occurs at the elbow, the damage may occur elsewhere along the course of the nerve. Clinical correlation recommended. Patient could not tolerate further emg testing on the left arm but given bilateral symmetric symptoms likely same diagnosis on the left.   2. There is also concomitant left moderately-severe Carpal Tunnel Syndrome.    Patient reports  neurontin is making his legs swell. Ibuprofen and narcotic pain medication is not helping with the pain. He had a reaction to Dexamethasone in the past but reports that he has taken prednisone without reaction many times. Will try a tapering course of prednisone and he will follow  up in the office this week.  Also advised not to put elbows on table or do anything to further irritate the ulnar nerve. He should wear elbow braces to bed and see if this helps.     Sarina Ill, MD  [facility] [address] [place], [place] [address]  Phone [phone] Fax [phone]

## 2014-12-14 DIAGNOSIS — G5620 Lesion of ulnar nerve, unspecified upper limb: Secondary | ICD-10-CM | POA: Insufficient documentation

## 2014-12-14 NOTE — Progress Notes (Signed)
See procedure note.

## 2014-12-15 NOTE — Procedures (Signed)
[facility]    Provider:  Dr Jaynee Eagles Referring Provider: Mikey Kirschner, MD Primary Care Physician:  Mickie Hillier, MD  HPI:  [name] [name] is a 74 y.o. male here as a referral from Dr. Wolfgang Phoenix for pain in the hands. This started at least a month ago. Patient reports significant pain and burning in the bilateral hands corresponding to an Ulnar distribution. He reports pain in volar and dorsal aspects of digits 4-5 of both hands as well as pain in the proximal volar medial hand and dorsal medial hand.  Denies any pain in the thumb and digits 1-2 of the hands. The pain is severe. He is constantly rubbing the hands together to try and alleviate the pain. He had symptoms in his hand like this many years ago and he remembers the sensory symptoms split right in the middle of one of his fingers and people told him that wasn't possible. He is taking narcotics every 4-6 hours for this pain and he never even did that after his surgery for his back. Neurontin made his legs swell. He has a bad habit of putting his elbows on the tables all the times and he notes that his elbows hurt and he has worn elbow pads in the past for his elbow pain. He also sleeps with his arms bent.   Summary:   Nerve Conduction Studies were performed on the bilateral upper extremities.  The right median APB motor nerve showed normal conductions  The right Median 2nd Digit sensory nerve was within normal limits. F Wave studies indicate that the right Median F wave has normal latency.  The left median APB motor nerve showed prolonged distal onset latency (4.5 ms, N<4.0). The left Median 2nd Digit sensory nerve showed prolonged distal peak latency (4.3 ms, N<3.9).  F Wave studies indicate that the left Median F wave has normal latency.  Bilateral Ulnar motor(recording at both the ADM and FDI) nerves were within normal limits. F Wave studies indicate that the bilateral Ulnar F waves have normal latencies The  bilateralUlnar 5th digit sensory nerves were within normal limits. The bilateral Radial sensory nerves were within normal limits.  The left median/ulnar (palm) comparison nerve showed prolonged distal peak latency (Median Palm, 2.3 ms, N<2.2) and abnormal peak latency difference (Median Palm-Ulnar Palm, 0.49ms, N<0.4) with a relative median delay.   EMG needle study of selected right-sided muscles was performed. The right Abductor Digiti Minimi showed markedly increased spontaneous acitivity (psw and fibs), increased motor unit amplitude and diminished motor unit recruitment. The First Dorsal Interosseous showed moderately increased spontaneous acitivity(psw and fibs) and diminished motor unit recruitment.The following muscles were normal: Deltoid, Triceps, Pronator Teres, Flexor Digitorum Profundus, Flexor Carpi Ulnaris, Opponens Pollicis, Extensor Indidicis muscles and c7/c8 paraspinals.  Conclusion: This is an abnormal study.   1. There is acute/ongoing denervation in 2 right-sided distal ulnar-innervated intrinsic hand muscles(FDI and ADM). However, unable to localize the site of the Ulnar lesion.  Given sensory symptoms in the ventral and dorsal medial  fingers and proximal hands and pain in the elbows, it may be Ulnar nerve entrapment at the elbow. Although damage to the ulnar nerve most often occurs at the elbow, the damage may occur elsewhere along the course of the nerve. Clinical correlation recommended. Patient could not tolerate further emg testing on the left arm but given bilateral symmetric symptoms likely same diagnosis on the left.   2. There is also concomitant left moderately-severe Carpal Tunnel Syndrome.  However patient  appear asymptomatic in the distribution of the left median nerve.   Patient reports neurontin is making his legs swell. Ibuprofen and narcotic pain medication is not helping with the pain. He had a reaction to Dexamethasone in the past but reports that he has  taken prednisone without reaction many times. Will try a tapering course of prednisone and he will follow up in the office this week.  Also advised not to put elbows on table or do anything to further irritate the ulnar nerve. He should wear elbow braces to bed and see if this helps.     Sarina Ill, MD  [facility] [address] [place], [place] [address]  Phone [phone] Fax [phone]

## 2014-12-16 ENCOUNTER — Telehealth: Payer: Self-pay | Admitting: *Deleted

## 2014-12-16 NOTE — Telephone Encounter (Signed)
Called to offer pt appt w/ Dr. Felecia Shelling, but pt refused appt. He stated he had one on 12/18/14 with Dr. Felecia Shelling that he cancelled and will call back to reschedule at some point. Told him to call back. He verbalized understanding.

## 2014-12-18 ENCOUNTER — Telehealth: Payer: Self-pay | Admitting: Neurology

## 2014-12-18 ENCOUNTER — Ambulatory Visit: Payer: Medicare Other | Admitting: Family Medicine

## 2014-12-18 ENCOUNTER — Ambulatory Visit: Payer: Self-pay | Admitting: Neurology

## 2014-12-18 NOTE — Telephone Encounter (Signed)
Spoke with pt and he stated he saw Dr. Jaynee Eagles on 7/29. On June 21st he had spinal surgery where he had a back fusion. For a week and a half he had severe hand pain and they gave him 1.5mg  dilaudid IV and then dilaudid tablets every 4 hours for 2 weeks. 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 He is going to withdrawals. He saw PCP who suspected this as well. Dr. Jaynee Eagles put him on prednisone pack and he has 1 day left but it has not seemed to help. He stated Dr. Jaynee Eagles mentioned another steroid but he didn't think he could take it because of past reaction.   The medications he had a reaction to was: dexamethsone 4mg  methocarbamol 750mg   He swelled up and went to the ER.    He is aware he was offered appt with Dr. Felecia Shelling today and called yesterday and cancelled. He did not think he would be able to make it to appt. I strongly encouraged for him to come in but he stated he does not trust doctors right now and he is reluctant to come in because "he is running to the bathroom all the time" I asked him why and he stated he is having withdrawls from the dialudid. Did not say anything more. I made an appointment for pt on 12/26/14 at 8:00 for check in at 7:45am. Encouraged him that Dr Jaynee Eagles would like to evaluate and talk with him in person. Told him I will send message to Dr. Jaynee Eagles as well. He verbalized understanding.

## 2014-12-18 NOTE — Telephone Encounter (Signed)
Patient is calling requesting a call back. He needs to discuss his condition and did not want to go into detail.

## 2014-12-19 ENCOUNTER — Telehealth: Payer: Self-pay | Admitting: Neurology

## 2014-12-19 MED ORDER — GABAPENTIN 100 MG PO CAPS: 100.0000 mg | ORAL_CAPSULE | Freq: Three times a day (TID) | ORAL | Status: DC

## 2014-12-19 NOTE — Telephone Encounter (Signed)
I called patient. The patient is having some issues with a right ulnar neuropathy and left carpal tunnel syndrome. He is on prednisone, rate and his Dosepak. He still having discomfort. I will try low-dose gabapentin for him, he will be seen next week in the office.

## 2014-12-19 NOTE — Telephone Encounter (Signed)
Pt called and says that he is taking the last of his predniSONE (DELTASONE) 10 MG tablet. His hands and arms do not look or feel any better. He wants to know if a second round would be advisable. He has more questions regarding medication as well. Please call and advise [phone]. If he does get another round he would like a phone call.

## 2014-12-23 NOTE — Telephone Encounter (Signed)
He feels better with conservative measure of the ulnar neuropathy. Will discuss in the office on Thursday.

## 2014-12-26 ENCOUNTER — Telehealth: Payer: Self-pay | Admitting: Neurology

## 2014-12-26 ENCOUNTER — Ambulatory Visit (INDEPENDENT_AMBULATORY_CARE_PROVIDER_SITE_OTHER): Payer: Medicare Other | Admitting: Neurology

## 2014-12-26 ENCOUNTER — Encounter: Payer: Self-pay | Admitting: Neurology

## 2014-12-26 VITALS — BP 117/71 | HR 89 | Temp 98.1°F | Wt 202.6 lb

## 2014-12-26 DIAGNOSIS — M79643 Pain in unspecified hand: Secondary | ICD-10-CM

## 2014-12-26 DIAGNOSIS — G5622 Lesion of ulnar nerve, left upper limb: Secondary | ICD-10-CM

## 2014-12-26 DIAGNOSIS — G5623 Lesion of ulnar nerve, bilateral upper limbs: Secondary | ICD-10-CM

## 2014-12-26 DIAGNOSIS — R29898 Other symptoms and signs involving the musculoskeletal system: Secondary | ICD-10-CM

## 2014-12-26 DIAGNOSIS — I2510 Atherosclerotic heart disease of native coronary artery without angina pectoris: Secondary | ICD-10-CM

## 2014-12-26 DIAGNOSIS — M6289 Other specified disorders of muscle: Secondary | ICD-10-CM | POA: Diagnosis not present

## 2014-12-26 DIAGNOSIS — M25529 Pain in unspecified elbow: Secondary | ICD-10-CM | POA: Diagnosis not present

## 2014-12-26 DIAGNOSIS — G5621 Lesion of ulnar nerve, right upper limb: Secondary | ICD-10-CM

## 2014-12-26 NOTE — Progress Notes (Signed)
[facility]    Provider:  Dr Jaynee Eagles Referring Provider: Mikey Kirschner, MD Primary Care Physician:  Mickie Hillier, MD  CC:  Elbow pain  Interval update: Conservative measures have made his ulnar neuropathy better. Neurontin made his legs swell.  He is titrating off of the dilaudid. He declines going to an orthopaedist. Dr. Kathyrn Sheriff and Ellene Route did his spinal fusion and he declines any further surgery. EMG showed acute/onoing denervation in distal ulnar muscles of the right arm but NCS was unable to localize it to the elbow or wrist. Given elbow pain, likely at the wrists. He declines repeat NCS and emg on the other (left) arm. He declines xrays of the elbows, he does not want surgical intervention. Will start cymbalta after speaking to Dr. Wolfgang Phoenix. He is having pain in the ulnar distribution, numbness, hand weakness, finding it difficult to perform ADLs and trying to keep his hands straight at night. Recommended OT and cumbalta. OT can fit him with elbow braces.   HPI: [name] [name] is a 74 y.o. male here as a referral from Dr. Wolfgang Phoenix for pain in the hands. This started at least a month ago. Patient reports significant pain and burning in the bilateral hands corresponding to an Ulnar distribution. He reports pain in volar and dorsal aspects of digits 4-5 of both hands as well as pain in the proximal volar medial hand and dorsal medial hand. Denies any pain in the thumb and digits 1-2 of the hands. The pain is severe. He is constantly rubbing the hands together to try and alleviate the pain. He had symptoms in his hand like this many years ago and he remembers the sensory symptoms split right in the middle of one of his fingers and people told him that wasn't possible. He is taking narcotics every 4-6 hours for this pain and he never even did that after his surgery for his back. Neurontin made his legs swell. He has a bad habit of putting his elbows on the tables all the times and  he notes that his elbows hurt and he has worn elbow pads in the past for his elbow pain. He also sleeps with his arms bent.   Review of Systems: Patient complains of symptoms per HPI as well as the following symptoms: no CP, no SOB. Pertinent negatives per HPI. All others negative.   Social History   Social History  . Marital Status: Married    Spouse Name: N/A  . Number of Children: N/A  . Years of Education: N/A   Occupational History  . Not on file.   Social History Main Topics  . Smoking status: Former Smoker -- 1.00 packs/day for 35 years    Types: Cigarettes    Quit date: 03/28/2003  . Smokeless tobacco: Not on file  . Alcohol Use: No  . Drug Use: No  . Sexual Activity: Yes    Birth Control/ Protection: None   Other Topics Concern  . Not on file   Social History Narrative    Family History  Problem Relation Age of Onset  . Hypertension Mother   . COPD Father   . Cancer Brother     brain    Past Medical History  Diagnosis Date  . AAA (abdominal aortic aneurysm)     needs yearly ultrasound  . Hypertension   . COPD (chronic obstructive pulmonary disease)   . Hypercholesterolemia   . MI (myocardial infarction) 1999  . BPH (benign prostatic hyperplasia)   .  GERD (gastroesophageal reflux disease)   . Arthritis   . Glaucoma   . Impaired fasting glucose   . Allergy   . Asthma   . CAD (coronary artery disease)   . HOH (hard of hearing)   . Cancer     skin cancer  . Anemia   . Dysrhythmia     pt. states it can be fast at times  . Low back pain   . Thrush     Past Surgical History  Procedure Laterality Date  . Cardiac catheterization      angioplasty  . Back surgery      x 3  . Right eye detached retina Bilateral   . Cholecystectomy  2000  . Cataract extraction w/phaco  03/20/2012    Procedure: CATARACT EXTRACTION PHACO AND INTRAOCULAR LENS PLACEMENT (IOC);  Surgeon: Williams Che, MD;  Location: [facility];  Service: Ophthalmology;  Laterality:  Right;  CDE:  8.45  . Colonoscopy  2009    repeat 5 years  . Cataract extraction w/phaco Left 04/02/2013    Procedure: CATARACT EXTRACTION PHACO AND INTRAOCULAR LENS PLACEMENT (IOC);  Surgeon: Williams Che, MD;  Location: [facility];  Service: Ophthalmology;  Laterality: Left;  CDE:  6.50  . Laparoscopic partial colectomy N/A 06/11/2013    Procedure: LAPAROSCOPIC HAND ASSISTED PARTIAL COLECTOMY;  Surgeon: Jamesetta So, MD;  Location: [facility];  Service: General;  Laterality: N/A;  . Yag laser application Left 47/42/5956    Procedure: YAG LASER APPLICATION;  Surgeon: Williams Che, MD;  Location: [facility];  Service: Ophthalmology;  Laterality: Left;  . Esophagogastroduodenoscopy    . Hernia repair Left     inguinal    Current Outpatient Prescriptions  Medication Sig Dispense Refill  . albuterol (PROVENTIL HFA;VENTOLIN HFA) 108 (90 BASE) MCG/ACT inhaler Inhale 2 puffs into the lungs every 6 (six) hours as needed for wheezing. 1 Inhaler 5  . albuterol (PROVENTIL) (2.5 MG/3ML) 0.083% nebulizer solution INHALE 1 VIAL VIA NEBULIZER FOUR TIMES DAILY. (Patient taking differently: INHALE 1 VIAL VIA NEBULIZER ONE TO TWO TIMES DAILY AS NEEDED) 375 mL 5  . Alum & Mag Hydroxide-Simeth (MAGIC MOUTHWASH) SOLN Take 5 mLs by mouth 4 (four) times daily as needed for mouth pain. 100 mL 0  . cetirizine (ZYRTEC) 10 MG tablet Take 10 mg by mouth daily.    . DiphenhydrAMINE HCl (BENADRYL ALLERGY PO) Take 2 tablets by mouth daily as needed (allergies).    Marland Kitchen esomeprazole (NEXIUM) 40 MG capsule Take 40 mg by mouth daily before breakfast.      . furosemide (LASIX) 20 MG tablet 3 qam for 7 days then 2 qam for 7 days then one a day 90 tablet 2  . HYDROcodone-acetaminophen (NORCO) 7.5-325 MG per tablet Take 1 tablet by mouth every 4 (four) hours as needed. 40 tablet 0  . HYDROmorphone (DILAUDID) 2 MG tablet Take 1-2 tablets (2-4 mg total) by mouth every 4 (four) hours as needed for severe pain. 60 tablet 0  . hydrOXYzine  (ATARAX/VISTARIL) 25 MG tablet Take 1 tablet (25 mg total) by mouth at bedtime as needed. 12 tablet 0  . lovastatin (MEVACOR) 40 MG tablet Take 40 mg by mouth.    . Meth-Hyo-M Bl-Na Phos-Ph Sal (URIBEL PO) Take by mouth. One q 6 prn    . montelukast (SINGULAIR) 10 MG tablet TAKE ONE TABLET BY MOUTH AT BEDTIME AS NEEDED FOR ALLERGIES. 30 tablet 5  . nystatin (MYCOSTATIN) 100000 UNIT/ML suspension Use as  directed 5 mLs in the mouth or throat.     . potassium chloride SA (K-DUR,KLOR-CON) 20 MEQ tablet Take 1 tablet (20 mEq total) by mouth daily. 30 tablet 0  . sulfamethoxazole-trimethoprim (BACTRIM DS,SEPTRA DS) 800-160 MG per tablet Take 1 tablet by mouth 2 (two) times daily. 42 tablet 0  . triamcinolone cream (KENALOG) 0.1 % Apply 1 application topically 2 (two) times daily. 120 g 0  . nitroGLYCERIN (NITROSTAT) 0.4 MG SL tablet Place 1 tablet (0.4 mg total) under the tongue every 5 (five) minutes as needed. (Patient not taking: Reported on 12/26/2014) 90 tablet 3   No current facility-administered medications for this visit.    Allergies as of 12/26/2014 - Review Complete 12/26/2014  Allergen Reaction Noted  . Cefzil [cefprozil] Nausea Only 08/25/2012  . Dexamethasone Swelling 03/27/2013  . Gabapentin  12/26/2014  . Neomycin Other (See Comments) 03/09/2012  . Tetracyclines & related Itching 11/04/2010  . Ciprofloxacin Nausea And Vomiting and Rash 11/04/2010  . Methocarbamol Swelling and Rash 03/27/2013  . Penicillins Rash 11/04/2010    Vitals: BP 117/71 mmHg  Pulse 89  Temp(Src) 98.1 F (36.7 C) (Oral)  Wt 202 lb 9.6 oz (91.899 kg) Last Weight:  Wt Readings from Last 1 Encounters:  12/26/14 202 lb 9.6 oz (91.899 kg)   Last Height:   Ht Readings from Last 1 Encounters:  12/13/14 6\' 1"  (1.854 m)    Physical exam: Exam: Gen: NAD, conversant Eyes: Conjunctivae clear without exudates or hemorrhage  Neuro: Detailed Neurologic Exam  Speech:    Speech is normal; fluent and  spontaneous with normal comprehension.  Cognition:    The patient is oriented to person, place, and time;    Cranial Nerves: Anisocoria, large left surgical pupil. Right pupil miotic with sluggish response. Visual fields are full to finger confrontation. Extraocular movements are intact. Trigeminal sensation is intact and the muscles of mastication are normal. The face is symmetric. The palate elevates in the midline. Hearing intact. Voice is normal. Shoulder shrug is normal. The tongue has normal motion without fasciculations.   Motor Observation:    No asymmetry, no atrophy, and no involuntary movements noted. Tone:    Normal muscle tone.   Gait: Good stride, slightly antalgic gait  Posture:    Posture is normal. normal erect    Strength: Bilateral abductor digiti quinti many me (intrinsic ulnar hand muscle) Weakness bilaterally. The other intrinsic muscles including the flexor digitorum profundus appear strong. Otherwise strength is V/V in the upper limbs.      Sensation: intact to LT     Reflex Exam:  DTR's:    Deep tendon reflexes in the upper and lower extremities are normal bilaterally.   Toes:    The toes are downgoing bilaterally.   Clonus:    Clonus is absent.    Assessment/Plan:  74 year old bilateral painful ulnar neuropathy. EMG showed distal acute/ongoing denervation in intrinsic ulnar hand muscles but unable to localize to elbow or wrist. Likely at the elbow given his pain there but cannot say for sure. Conservative measures such as wrapping his elbows and tells at night have helped. Neurontin and Lyrica had side effects including pedal edema. Suggested starting Cymbalta, tried calling Dr. Lurena Joiner in however he is out of the office to Monday.  - Start cymbalta, call Dr. Wolfgang Phoenix first - Conservative measures - Occupational therapy for ulnar neuropathy, they can fit him with elbow braces - Declined xrays of the elbows - declined repeat NCS (sometimes if done too  soon,  will not show a drop across the elbow or wrist for loaclization. Sometimes repeat is helpful). Declined EMG of the left arm.   Sarina Ill, MD  [facility] [address] [place], [place] [address]  Phone [phone] Fax [phone]  A total of 30 minutes was spent face-to-face with this patient. Over half this time was spent on counseling patient on the ulnar neuropathy diagnosis and different diagnostic and therapeutic options available.

## 2014-12-26 NOTE — Telephone Encounter (Signed)
[name] [name] - I told patient I would call dr. Wolfgang Phoenix and discuss starting Cymbalta. Dr. Wolfgang Phoenix is out of the office until Monday so I will call him at that time and let patient know. Please let patient know, thanks

## 2014-12-26 NOTE — Telephone Encounter (Signed)
Spoke w/ pt to let him know Dr. Jaynee Eagles tried calling Dr. Wolfgang Phoenix but he is out of the office until Monday. She will talk with Dr. Wolfgang Phoenix Monday and call him back then. Pt verbalized understanding.

## 2014-12-26 NOTE — Telephone Encounter (Signed)
Left VM for pt to call back. Please let pt know about Dr. Jaynee Eagles message below if he calls back, thank you!

## 2014-12-26 NOTE — Patient Instructions (Signed)
Overall you are doing fairly well but I do want to suggest a few things today:   Remember to drink plenty of fluid, eat healthy meals and do not skip any meals. Try to eat protein with a every meal and eat a healthy snack such as fruit or nuts in between meals. Try to keep a regular sleep-wake schedule and try to exercise daily, particularly in the form of walking, 20-30 minutes a day, if you can.   As far as your medications are concerned, I would like to suggest: Cymbalta 30mg  daily will call Dr. Wolfgang Phoenix Occupational Therapy for hand and elbow pain.   I would like to see you back in 3 months, sooner if we need to. Please call us with any interim questions, concerns, problems, updates or refill requests.   Please also call us for any test results so we can go over those with you on the phone.  My clinical assistant and will answer any of your questions and relay your messages to me and also relay most of my messages to you.   Our phone number is [phone]. We also have an after hours call service for urgent matters and there is a physician on-call for urgent questions. For any emergencies you know to call 911 or go to the nearest emergency room

## 2014-12-27 ENCOUNTER — Encounter (HOSPITAL_COMMUNITY): Payer: Self-pay

## 2014-12-27 ENCOUNTER — Ambulatory Visit (HOSPITAL_COMMUNITY): Payer: Medicare Other | Attending: Neurology

## 2014-12-27 ENCOUNTER — Telehealth: Payer: Self-pay | Admitting: Neurology

## 2014-12-27 DIAGNOSIS — G5621 Lesion of ulnar nerve, right upper limb: Secondary | ICD-10-CM | POA: Diagnosis not present

## 2014-12-27 DIAGNOSIS — G5622 Lesion of ulnar nerve, left upper limb: Secondary | ICD-10-CM | POA: Diagnosis not present

## 2014-12-27 NOTE — Therapy (Signed)
[place], [place], [address] Phone: [phone]   Fax:  [phone]  Occupational Therapy Evaluation  Patient Details  Name: [name] [name] MRN: [id number] Date of Birth: 12-Mar-1941 Referring Provider:  Melvenia Beam, MD  Encounter Date: 12/27/2014      OT End of Session - 12/27/14 1635    Visit Number 1   Number of Visits 1   Authorization Type medicare   Authorization Time Period before 10th visit   Authorization - Visit Number 1   Authorization - Number of Visits 10   OT Start Time 1524   OT Stop Time 1624   OT Time Calculation (min) 60 min   Activity Tolerance Patient tolerated treatment well   Behavior During Therapy [facility] for tasks assessed/performed      Past Medical History  Diagnosis Date  . AAA (abdominal aortic aneurysm)     needs yearly ultrasound  . Hypertension   . COPD (chronic obstructive pulmonary disease)   . Hypercholesterolemia   . MI (myocardial infarction) 1999  . BPH (benign prostatic hyperplasia)   . GERD (gastroesophageal reflux disease)   . Arthritis   . Glaucoma   . Impaired fasting glucose   . Allergy   . Asthma   . CAD (coronary artery disease)   . HOH (hard of hearing)   . Cancer     skin cancer  . Anemia   . Dysrhythmia     pt. states it can be fast at times  . Low back pain   . Thrush     Past Surgical History  Procedure Laterality Date  . Cardiac catheterization      angioplasty  . Back surgery      x 3  . Right eye detached retina Bilateral   . Cholecystectomy  2000  . Cataract extraction w/phaco  03/20/2012    Procedure: CATARACT EXTRACTION PHACO AND INTRAOCULAR LENS PLACEMENT (IOC);  Surgeon: Williams Che, MD;  Location: [facility];  Service: Ophthalmology;  Laterality: Right;  CDE:  8.45  . Colonoscopy  2009    repeat 5 years  . Cataract extraction w/phaco Left 04/02/2013    Procedure: CATARACT EXTRACTION PHACO AND INTRAOCULAR LENS PLACEMENT  (IOC);  Surgeon: Williams Che, MD;  Location: [facility];  Service: Ophthalmology;  Laterality: Left;  CDE:  6.50  . Laparoscopic partial colectomy N/A 06/11/2013    Procedure: LAPAROSCOPIC HAND ASSISTED PARTIAL COLECTOMY;  Surgeon: Jamesetta So, MD;  Location: [facility];  Service: General;  Laterality: N/A;  . Yag laser application Left 26/71/2458    Procedure: YAG LASER APPLICATION;  Surgeon: Williams Che, MD;  Location: [facility];  Service: Ophthalmology;  Laterality: Left;  . Esophagogastroduodenoscopy    . Hernia repair Left     inguinal    There were no vitals filed for this visit.  Visit Diagnosis:  Neuropathy, ulnar at elbow, left - Plan: Ot plan of care cert/re-cert  Neuropathy, ulnar at elbow, right - Plan: Ot plan of care cert/re-cert      Subjective Assessment - 12/27/14 1629    Subjective  S: I've been wrapping towels around my elbows at night it just hasn't been working. I wake up and they're all over the place.   Pertinent History Patient is a 74 y/o male s/p bilateral ulnar nueropathy which occured after spinal fusion sx June 2016. Since spinal fusion, he has experienced increase numbness and pain in bilateral UE. Patient has  been diagnosed with bilateral ulnar nueropathy.  Dr. Jaynee Eagles has referred patient to occupational therapy to fabricate bilateral elbow extension splints .   Patient Stated Goals To be able to decrease numbness and sleep comfortably.   Currently in Pain? No/denies           [facility] OT Assessment - 2015/01/20 1633    Assessment   Diagnosis Bilateral ulnar nueropathy   Onset Date 11/05/14   Prior Therapy None   Precautions   Precautions None   Written Expression   Dominant Hand Right   Cognition   Overall Cognitive Status Within Functional Limits for tasks assessed   ROM / Strength   AROM / PROM / Strength AROM   AROM   Overall AROM  Within functional limits for tasks performed   Overall AROM Comments Bilateral elbow AROM WNL.                    OT Treatments/Exercises (OP) - 20-Jan-2015 1637    Splinting   Splinting Bilateral elbow extension splints fabricated to promote elbow extension when sleeping to decrease the amount of numbness, tingling, and pain patient experiences when sleeping. Reviewed care and cleaning of spling, precautions, and wearing schedule. Patient able to donn/doff splint independently.                OT Education - 01-20-15 1634    Education provided Yes   Education Details Education given regarding ulnar neuropathy, prognosis, compensatory techniques, splints care and cleaning, wearing schedule, precautions.   Person(s) Educated Patient   Methods Explanation;Handout   Comprehension Verbalized understanding          OT Short Term Goals - 01-20-2015 1639    OT SHORT TERM GOAL #1   Title Patient will receive bilateral elbow extension splint and be educated on wearing schedule, donning/doffing, and precautions.   Time 1   Period Days   Status Achieved                  Plan - 2015/01/20 1636    Clinical Impression Statement A: Patient is a 74 y/o male s/p bilateral ulnar neuropathy causing increased numbness, tingling, and pain when sleeping at night due to patient's sleeping position of elbow flexion.    Pt will benefit from skilled therapeutic intervention in order to improve on the following deficits (Retired) Impaired sensation;Pain   Rehab Potential Excellent   OT Frequency 1x / week   OT Duration --  1 week   OT Treatment/Interventions Patient/family education;Splinting   Plan P: 1 time visit for splint fabrication.    Consulted and Agree with Plan of Care Patient          G-Codes - January 20, 2015 1641    Functional Assessment Tool Used clinical judgement   Functional Limitation Other OT primary   Other OT Primary Current Status (U7253) At least 1 percent but less than 20 percent impaired, limited or restricted   Other OT Primary Goal Status (G6440) At  least 1 percent but less than 20 percent impaired, limited or restricted   Other OT Primary Discharge Status [phone]) At least 1 percent but less than 20 percent impaired, limited or restricted      Problem List Patient Active Problem List   Diagnosis Date Noted  . Ulnar neuropathy 12/14/2014  . Leg swelling 12/05/2014  . Neuropathic pain 12/05/2014  . S/P spinal surgery 12/05/2014  . Venous stasis dermatitis 12/05/2014  . Insomnia 12/05/2014  . Generalized pain   .  UTI (lower urinary tract infection) 11/11/2014  . Thrush of mouth and esophagus 11/11/2014  . Urinary retention   . Urinary tract infectious disease   . Lumbar spondylosis 11/05/2014  . Colon neoplasm 06/11/2013  . GERD (gastroesophageal reflux disease) 01/19/2012  . PAC (premature atrial contraction) 09/15/2010  . ELEVATED BP READING WITHOUT DX HYPERTENSION 05/29/2009  . Mixed hyperlipidemia 04/15/2008  . BEN HTN HEART DISEASE WITHOUT HEART FAIL 04/15/2008  . Coronary atherosclerosis 04/15/2008  . Abdominal aortic aneurysm 04/15/2008  . COPD (chronic obstructive pulmonary disease) 04/15/2008    Ailene Ravel, OTR/L,CBIS  [phone]  12/27/2014, 4:50 PM  [place] [address] [place], [place], [address] Phone: [phone]   Fax:  [phone]

## 2014-12-27 NOTE — Telephone Encounter (Signed)
Patient called stating [facility] did not have referral. I called and spoke with Izora Gala, she does have referral but is not in her work que. She said in Dept 2 it has to read [facility]. She thinks that will attach it to the work que. She said the referral is attached to the pt's chart and she would never know to look for it unless someone called her to ask about. She hope this will helpful in the future. She can be reached at [phone].

## 2014-12-27 NOTE — Patient Instructions (Signed)
Your Splint This splint should initially be fitted by a healthcare practitioner.  The healthcare practitioner is responsible for providing wearing instructions and precautions to the patient, other healthcare practitioners and care provider involved in the patient's care.  This splint was custom made for you. Please read the following instructions to learn about wearing and caring for your splint.  Precautions Should your splint cause any of the following problems, remove the splint immediately and contact your therapist/physician.  Swelling  Severe Pain  Pressure Areas  Stiffness  Numbness  Do not wear your splint while operating machinery unless it has been fabricated for that purpose.  When To Wear Your Splint Where your splint according to your therapist/physician instructions. Nighttime only.  Care and Cleaning of Your Splint 1. Keep your splint away from open flames. 2. Your splint will lose its shape in temperatures over 135 degrees Farenheit, ( in car windows, near radiators, ovens or in hot water).  Never make any adjustments to your splint, if the splint needs adjusting remove it and make an appointment to see your therapist. 3. Your splint, including the cushion liner may be cleaned with soap and lukewarm water.  Do not immerse in hot water over 135 degrees Farenheit. 4. Straps may be washed with soap and water, but do not moisten the self-adhesive portion. 5. For ink or hard to remove spots use a scouring cleanser which contains chlorine.  Rinse the splint thoroughly after using chlorine cleanser. 6.

## 2014-12-29 NOTE — Telephone Encounter (Signed)
[name] [name] - can you call [name] and see if he was able to schedule OT please. I will call Dr. Wolfgang Phoenix on Monday and see if Cymbalta is ok with him, please let patient know Dr. Wolfgang Phoenix was out of the office last week so I could not discuss Cymbalta with him. Thanks.

## 2014-12-30 ENCOUNTER — Other Ambulatory Visit: Payer: Self-pay | Admitting: Neurology

## 2014-12-30 ENCOUNTER — Encounter (HOSPITAL_COMMUNITY): Payer: Self-pay

## 2014-12-30 ENCOUNTER — Ambulatory Visit (HOSPITAL_COMMUNITY): Payer: Medicare Other

## 2014-12-30 DIAGNOSIS — G5621 Lesion of ulnar nerve, right upper limb: Secondary | ICD-10-CM | POA: Diagnosis not present

## 2014-12-30 DIAGNOSIS — G5622 Lesion of ulnar nerve, left upper limb: Secondary | ICD-10-CM | POA: Diagnosis not present

## 2014-12-30 MED ORDER — DULOXETINE HCL 30 MG PO CPEP: 30.0000 mg | ORAL_CAPSULE | Freq: Every day | ORAL | Status: DC

## 2014-12-30 NOTE — Telephone Encounter (Signed)
[name] [name], would you call mR Franta and let him know that Dr. Wolfgang Phoenix approved starting cymbalta for his nerve pain. Will start at a low dose, 30mg  daily in the morning. I discussed this with him but again some of the most common side effects can include nausea, dry mouth, GI issues, dizziness, insomnia. Some very rare side effects can be depression, rash, syncope - all medications have lots of side effects, stop for anything concerning. Doesn't mean he will have the side effects, just watch out for them. If he is ok on the lower dose, we can increase it in a few weeks. Have him stop for any side effects and call us. People usually tolerate Cymbalta well.

## 2014-12-30 NOTE — Telephone Encounter (Signed)
Dr. Jaynee Eagles-- pt had rehab today when I checked appt. I already spoke w/ him on 8/11 to let him know you will talk to Dr. Wolfgang Phoenix today and call him after you speak with him. Thank you!

## 2014-12-30 NOTE — Therapy (Signed)
[facility] [place], [place], [address] Phone: [phone]   Fax:  [phone]  Occupational Therapy Treatment  Patient Details  Name: [name] [name] MRN: [id number] Date of Birth: [birth year] Referring Provider:  Mikey Kirschner, MD  Encounter Date: 12/30/2014      OT End of Session - 12/30/14 1820    Visit Number 2   Number of Visits 2   Authorization Type medicare   Authorization Time Period before 10th visit   Authorization - Visit Number 2   Authorization - Number of Visits 10   OT Start Time 2694   OT Stop Time 1745   OT Time Calculation (min) 60 min   Activity Tolerance Patient tolerated treatment well   Behavior During Therapy [facility] for tasks assessed/performed      Past Medical History  Diagnosis Date  . AAA (abdominal aortic aneurysm)     needs yearly ultrasound  . Hypertension   . COPD (chronic obstructive pulmonary disease)   . Hypercholesterolemia   . MI (myocardial infarction) 1999  . BPH (benign prostatic hyperplasia)   . GERD (gastroesophageal reflux disease)   . Arthritis   . Glaucoma   . Impaired fasting glucose   . Allergy   . Asthma   . CAD (coronary artery disease)   . HOH (hard of hearing)   . Cancer     skin cancer  . Anemia   . Dysrhythmia     pt. states it can be fast at times  . Low back pain   . Thrush     Past Surgical History  Procedure Laterality Date  . Cardiac catheterization      angioplasty  . Back surgery      x 3  . Right eye detached retina Bilateral   . Cholecystectomy  2000  . Cataract extraction w/phaco  03/20/2012    Procedure: CATARACT EXTRACTION PHACO AND INTRAOCULAR LENS PLACEMENT (IOC);  Surgeon: Williams Che, MD;  Location: [facility];  Service: Ophthalmology;  Laterality: Right;  CDE:  8.45  . Colonoscopy  2009    repeat 5 years  . Cataract extraction w/phaco Left 04/02/2013    Procedure: CATARACT EXTRACTION PHACO AND INTRAOCULAR LENS PLACEMENT  (IOC);  Surgeon: Williams Che, MD;  Location: [facility];  Service: Ophthalmology;  Laterality: Left;  CDE:  6.50  . Laparoscopic partial colectomy N/A 06/11/2013    Procedure: LAPAROSCOPIC HAND ASSISTED PARTIAL COLECTOMY;  Surgeon: Jamesetta So, MD;  Location: [facility];  Service: General;  Laterality: N/A;  . Yag laser application Left 85/46/2703    Procedure: YAG LASER APPLICATION;  Surgeon: Williams Che, MD;  Location: [facility];  Service: Ophthalmology;  Laterality: Left;  . Esophagogastroduodenoscopy    . Hernia repair Left     inguinal    There were no vitals filed for this visit.  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 As soon as I put them on it was horribly painful.    Currently in Pain? No/denies            [facility] OT Assessment - 12/30/14 1818    Assessment   Diagnosis Bilateral ulnar nueropathy   Precautions   Precautions None  OT Treatments/Exercises (OP) - 12/30/14 1819    Splinting   Splinting Adjustments made to bilateral extension splints. Shortened both splints, applied new padding, and decreased the degree of extension. Splints were re-warmed to mold to medial portion of bilateral elbows.                 OT Education - 12/30/14 1820    Education provided Yes   Education Details Splint donning/doffing and precautions.   Person(s) Educated Patient;Spouse   Methods Explanation;Demonstration;Handout;Verbal cues   Comprehension Verbalized understanding          OT Short Term Goals - 12/27/14 1639    OT SHORT TERM GOAL #1   Title Patient will receive bilateral elbow extension splint and be educated on wearing schedule, donning/doffing, and precautions.   Time    Period    Status                   Plan - 12/30/14 1821    Clinical Impression  Statement A: Pt brought in splints complaining of increased pain due to pressure areas at proximal and distal ends of splints for bilateral UE. Splints were adjusted and re molded to medial aspect of bilateral elbows. Pt voiced increased comfort at end of session.   Plan P: Follow up with patient regarding splint adjustments on 12/31/14.        Problem List Patient Active Problem List   Diagnosis Date Noted  . Ulnar neuropathy 12/14/2014  . Leg swelling 12/05/2014  . Neuropathic pain 12/05/2014  . S/P spinal surgery 12/05/2014  . Venous stasis dermatitis 12/05/2014  . Insomnia 12/05/2014  . Generalized pain   . UTI (lower urinary tract infection) 11/11/2014  . Thrush of mouth and esophagus 11/11/2014  . Urinary retention   . Urinary tract infectious disease   . Lumbar spondylosis 11/05/2014  . Colon neoplasm 06/11/2013  . GERD (gastroesophageal reflux disease) 01/19/2012  . PAC (premature atrial contraction) 09/15/2010  . ELEVATED BP READING WITHOUT DX HYPERTENSION 05/29/2009  . Mixed hyperlipidemia 04/15/2008  . BEN HTN HEART DISEASE WITHOUT HEART FAIL 04/15/2008  . Coronary atherosclerosis 04/15/2008  . Abdominal aortic aneurysm 04/15/2008  . COPD (chronic obstructive pulmonary disease) 04/15/2008    Ailene Ravel, OTR/L,CBIS  [phone]  12/30/2014, 6:24 PM  [facility] [address] [place], [place], [address] Phone: [phone]   Fax:  [phone]

## 2014-12-31 ENCOUNTER — Encounter: Payer: Self-pay | Admitting: Family Medicine

## 2014-12-31 ENCOUNTER — Ambulatory Visit (INDEPENDENT_AMBULATORY_CARE_PROVIDER_SITE_OTHER): Payer: Medicare Other | Admitting: Family Medicine

## 2014-12-31 VITALS — BP 126/82 | Ht 73.0 in | Wt 203.4 lb

## 2014-12-31 DIAGNOSIS — M792 Neuralgia and neuritis, unspecified: Secondary | ICD-10-CM

## 2014-12-31 DIAGNOSIS — I2510 Atherosclerotic heart disease of native coronary artery without angina pectoris: Secondary | ICD-10-CM

## 2014-12-31 DIAGNOSIS — R600 Localized edema: Secondary | ICD-10-CM

## 2014-12-31 DIAGNOSIS — I10 Essential (primary) hypertension: Secondary | ICD-10-CM | POA: Diagnosis not present

## 2014-12-31 MED ORDER — POTASSIUM CHLORIDE CRYS ER 20 MEQ PO TBCR: 20.0000 meq | EXTENDED_RELEASE_TABLET | Freq: Every day | ORAL | Status: DC

## 2014-12-31 MED ORDER — FUROSEMIDE 20 MG PO TABS: ORAL_TABLET | ORAL | Status: DC

## 2014-12-31 NOTE — Telephone Encounter (Signed)
Called and spoke w/pt. Told him Dr. Jaynee Eagles spoke w/ Dr. Wolfgang Phoenix last night and he approved him starting Cymbalta for nerve pain. She wants to start low dose, 30mg  in the morning. Went over side effects again. Common being nausea, dry mouth, GI issues, dizziness, insomnia. Rare being depression, rash, syncope. Stop medication if any side effects occur and call to let us know. He may not have any side effects, we want to have him be aware of possible ones. If he is okay on lower dose, we can increase it in a few weeks. He verbalized understanding. He has appt with Dr. Wolfgang Phoenix today around 1pm. Told him to call with further questions if needed.

## 2014-12-31 NOTE — Progress Notes (Signed)
   Subjective:    Patient ID: [name] [name], male    DOB: 1940/05/29, 74 y.o.   MRN: [id number] Patient arrives with protracted discussion concerning all of his problems.   HPI  Patient in today for a follow up for pedal edema. Patient states that swelling is still present but has improved.   Patient would also like to discuss Cymbalta that was prescribed on yesterday by another MD.  Pt has confirmed neuropathic pain in arms and hands, though overall better. Was prescribed Cymbalta by the neurologist. The neurologist kindly touch base with me and I agreed. I told the patient this. Now the patient would prefer not to take it if at all possible. He reports his neuropathy has improved  Post inflammed neurosurg Patient states no other concerns at this time.  Has cut down to ibuprofen to four twice per d  Stopped dilaudid, now off the med;;  See again in three months next  Notes less swelling. Patient states she's. Sure the Neurontin causes swelling  Review of Systems No headache no chest pain no back pain no abdominal pain no change in bowel habits    Objective:   Physical Exam  Alert vitals stable. HEENT normal neck supple lungs clear heart rare rhythm ankles edema markedly improved pulses intact negative Homans sign      Assessment & Plan:  Impression 1 peripheral edema much improved #2 neuropathic pain ongoing but improved #3 hypertension stable plan many questions answered. At patient's request we will hold off on Cymbalta for now. Maintain other medications. Recheck met 7 in a couple 3 weeks. Follow-up in several months. Warning signs discussed WSL

## 2015-01-01 NOTE — Telephone Encounter (Signed)
Thank you :)

## 2015-01-02 ENCOUNTER — Encounter (HOSPITAL_COMMUNITY): Payer: Self-pay

## 2015-01-02 NOTE — Therapy (Unsigned)
[facility] [place], [place], [address] Phone: [phone]   Fax:  [phone]  Patient Details  Name: [name] [name] MRN: [id number] Date of Birth: May 22, 1940 Referring Provider:  No ref. provider found  Encounter Date: 01/02/2015 OCCUPATIONAL THERAPY DISCHARGE SUMMARY  Visits from Start of Care: 2  Patient was given fabricated bilateral elbow extension splints which were made on 12/27/14. Pt called the following Monday to report that he was unable to wear splints due to increased pain at proximal and distal ends. Pt came in on Monday  12/30/14 and OT made adjustments to splints including shortening, decreasing degree of extension and adding additional padding at both ends of splints. Pt was happy with adjustments. OT informed patient that if these splints continue to be painful to call us immediately. Also informed him that we could take a different approach and fabricate splints on the medial aspect of arms instead of lateral aspect.  Patient called clinic on Tuesday 12/31/14 and stated that the splints are still causing increased pain and he states he will refer back to using towels versus the splints. Pt will be discharged per their request.   Ailene Ravel, OTR/L,CBIS  [phone]  01/02/2015, 11:39 AM  [facility] [address] [place], [place], [address] Phone: [phone]   Fax:  [phone]

## 2015-01-03 ENCOUNTER — Encounter (HOSPITAL_COMMUNITY): Payer: Self-pay

## 2015-01-24 ENCOUNTER — Telehealth: Payer: Self-pay | Admitting: Cardiovascular Disease

## 2015-01-24 NOTE — Telephone Encounter (Signed)
Spoke with pt and he states that back in May he had his BP meds changed several times due to having reactions. Pt states at that time he had thought Dr. Johnsie Cancel had wanted him to follow up every 3 months. Pt states that he is not having any cardiac issues and feels fine. Scheduled pt for 03/2015 per recall in system and informed him we would let him know if Dr. Johnsie Cancel wanted him seen sooner. Will forward to Dr. Johnsie Cancel and his nurse Altha Harm for review, advisement and follow up in regards to whether pt needs to be sooner or if November is ok.

## 2015-01-24 NOTE — Telephone Encounter (Signed)
New Message   Pt calling wants to know when is it best to come in   Pt states Dr. Johnsie Cancel put him on BP medication and wants patient to come in every 3 months.  Recall states to schedule 03/31/15,pt states he will come in on this date if [facility] approves  Pt will like RN to call him back

## 2015-01-26 NOTE — Telephone Encounter (Signed)
November is fine if he is doing well

## 2015-01-27 NOTE — Telephone Encounter (Signed)
Informed pt that Dr. Johnsie Cancel said ok to wait until November as long as he is doing well. Pt verbalized understanding and was in agreement with this plan. Advised pt to call our office if he has any issues prior to his November visit.

## 2015-02-03 DIAGNOSIS — I10 Essential (primary) hypertension: Secondary | ICD-10-CM | POA: Diagnosis not present

## 2015-02-04 LAB — BASIC METABOLIC PANEL
BUN/Creatinine Ratio: 15 (ref 10–22)
BUN: 14 mg/dL (ref 8–27)
CO2: 28 mmol/L (ref 18–29)
Calcium: 9.4 mg/dL (ref 8.6–10.2)
Chloride: 100 mmol/L (ref 97–108)
Creatinine, Ser: 0.94 mg/dL (ref 0.76–1.27)
GFR calc Af Amer: 93 mL/min/{1.73_m2} (ref >59)
GFR calc non Af Amer: 80 mL/min/{1.73_m2} (ref >59)
Glucose: 102 mg/dL — ABNORMAL HIGH (ref 65–99)
Potassium: 4.5 mmol/L (ref 3.5–5.2)
Sodium: 143 mmol/L (ref 134–144)

## 2015-02-13 ENCOUNTER — Ambulatory Visit: Payer: Medicare Other | Admitting: Family Medicine

## 2015-02-14 ENCOUNTER — Ambulatory Visit (INDEPENDENT_AMBULATORY_CARE_PROVIDER_SITE_OTHER): Payer: Medicare Other | Admitting: Family Medicine

## 2015-02-14 ENCOUNTER — Encounter: Payer: Self-pay | Admitting: Family Medicine

## 2015-02-14 VITALS — BP 136/84 | Ht 73.0 in | Wt 209.0 lb

## 2015-02-14 DIAGNOSIS — R21 Rash and other nonspecific skin eruption: Secondary | ICD-10-CM | POA: Diagnosis not present

## 2015-02-14 DIAGNOSIS — I2510 Atherosclerotic heart disease of native coronary artery without angina pectoris: Secondary | ICD-10-CM | POA: Diagnosis not present

## 2015-02-14 DIAGNOSIS — G4700 Insomnia, unspecified: Secondary | ICD-10-CM

## 2015-02-14 DIAGNOSIS — I10 Essential (primary) hypertension: Secondary | ICD-10-CM | POA: Diagnosis not present

## 2015-02-14 MED ORDER — TRAZODONE HCL 50 MG PO TABS: 25.0000 mg | ORAL_TABLET | Freq: Every evening | ORAL | Status: DC | PRN

## 2015-02-14 NOTE — Progress Notes (Signed)
   Subjective:    Patient ID: [name] [name], male    DOB: 1941-01-12, 74 y.o.   MRN: [id number]  Rash Episode onset: july. since starting lasix. Location: chest and back. He was exposed to a new medication (lasix). Treatments tried: lotions, cortisone. The treatment provided no relief.   Has developed rash on chest and back, nost of the summer, tried to rx with lotion and itching. Patient fairly convinced that it is his diarrhetic.  Worse partic at night   Pt having trouble sleeping. Only sleeps 2 -3 hours a night. TRouble sleeping with, and trouble with early morn awakening,   Elevated heart rate.hx of this in the past, card saw this summer, rec a low dose beta blocker, but pt had diarrhea and othdr things with it Atenolol and carvedilol, did not work. Wonders if he needs to be concerned about this. No cyst 2 chest pain       Review of Systems  Skin: Positive for rash.   no shortness breath no nausea no diaphoresis complete review systems otherwise negative     Objective:   Physical Exam  Alert vitals stable blood pressure good on repeat HEENT normal. Lungs clear. Heart regular in rhythm. Rate currently 85. Lungs no wheezes ankles trace edema  Maculopapular rash trunk    Assessment & Plan:  Impression insomnia discussed one need to add additional medication. Trial of Ambien 5 daily at bedtime #2 rash likely secondary to diarrhetic i.e. Lasix stop this medicine #3 mild tachycardia. Patient appears very worried about it. After discussion encouraged to call back his cardiologist. Plan as noted initiate Ambien. Stop other agents diet exercise discussed stop Lasix. 25 minutes spent most in discussion WSL

## 2015-02-17 ENCOUNTER — Telehealth: Payer: Self-pay | Admitting: Family Medicine

## 2015-02-17 ENCOUNTER — Other Ambulatory Visit: Payer: Self-pay | Admitting: *Deleted

## 2015-02-17 MED ORDER — ZOLPIDEM TARTRATE 5 MG PO TABS: 5.0000 mg | ORAL_TABLET | Freq: Every evening | ORAL | Status: DC | PRN

## 2015-02-17 NOTE — Telephone Encounter (Signed)
ambien 5 numb 30 one qhs ,5 ref tell him at his age the experts discourage meds like xanax etc

## 2015-02-17 NOTE — Telephone Encounter (Signed)
Pt was given trazodone to help him sleep. Pt states that it makes him wired and has quit taking it. Pt wants to know if there is something else that he can try.  [employer]

## 2015-02-17 NOTE — Telephone Encounter (Signed)
Discussed with pt. Med faxed to pharm.

## 2015-02-21 ENCOUNTER — Telehealth: Payer: Self-pay | Admitting: Family Medicine

## 2015-02-21 MED ORDER — ZOLPIDEM TARTRATE 10 MG PO TABS: 10.0000 mg | ORAL_TABLET | Freq: Every evening | ORAL | Status: DC | PRN

## 2015-02-21 NOTE — Telephone Encounter (Signed)
Called patient and informed him per Dr.Steve Luking- Increase to 10 mg, if this does not work may need sleep specialist. Patient verbalized understanding. New prescription for 10 MG of ambien sent into pharmacy.

## 2015-02-21 NOTE — Telephone Encounter (Signed)
Patient was given prescription for ambien 5mg  is not helping him sleep.Can you call in something stronger to [place]

## 2015-02-21 NOTE — Telephone Encounter (Signed)
Increase to 10 mg, if this does not work may need sleep specialist

## 2015-03-10 ENCOUNTER — Encounter: Payer: Self-pay | Admitting: Family Medicine

## 2015-03-10 ENCOUNTER — Ambulatory Visit (INDEPENDENT_AMBULATORY_CARE_PROVIDER_SITE_OTHER): Payer: Medicare Other | Admitting: Family Medicine

## 2015-03-10 VITALS — BP 102/70 | Temp 98.5°F | Ht 73.0 in | Wt 209.0 lb

## 2015-03-10 DIAGNOSIS — K219 Gastro-esophageal reflux disease without esophagitis: Secondary | ICD-10-CM | POA: Diagnosis not present

## 2015-03-10 DIAGNOSIS — J310 Chronic rhinitis: Secondary | ICD-10-CM

## 2015-03-10 DIAGNOSIS — I2510 Atherosclerotic heart disease of native coronary artery without angina pectoris: Secondary | ICD-10-CM

## 2015-03-10 DIAGNOSIS — J329 Chronic sinusitis, unspecified: Secondary | ICD-10-CM

## 2015-03-10 MED ORDER — SUCRALFATE 1 G PO TABS: 1.0000 g | ORAL_TABLET | Freq: Three times a day (TID) | ORAL | Status: DC

## 2015-03-10 NOTE — Patient Instructions (Signed)
Resume keflex and take four times per day for seven days

## 2015-03-10 NOTE — Progress Notes (Signed)
   Subjective:    Patient ID: [name] [name], male    DOB: Oct 25, 1940, 74 y.o.   MRN: [id number]  URI  This is a new problem. The current episode started in the past 7 days. The problem has been unchanged. There has been no fever. Associated symptoms include abdominal pain and congestion. He has tried nothing for the symptoms. The treatment provided no relief.  Abdominal Pain This is a new problem. The current episode started in the past 7 days. The problem occurs intermittently. The problem has been unchanged. The pain is located in the generalized abdominal region. The pain is moderate. The quality of the pain is burning. The abdominal pain does not radiate. Associated symptoms comments: bloating. He has tried antacids for the symptoms. The treatment provided no relief.    Patient states that he has no other concerns at this time.  Congestion and drainage and irritation  Very sensitive upper abdomen and loose stools  No sig sicvkness in the fmily   Using zantac and mylanta and tums  Tried albuterol for wheezing and nyquil at nigh    History of reflux and gastritis takes Nexium regularly Review of Systems  HENT: Positive for congestion.   Gastrointestinal: Positive for abdominal pain.   No high fevers no vomiting ROS otherwise negative    Objective:   Physical Exam  Alert vitals stable HET Mondays congestion frank normal neck supple lungs no wheezes or crackles intermittent cough heart regular rate and rhythm epigastrium mild tenderness negative abdominal pain elsewhere      Assessment & Plan:  Impression 1 flare of reflux and epigastric discomfort possible gastritis #2 URI/bronchitis plan antibiotics prescribed. Add Carafate expect slow resolution WSL

## 2015-03-14 NOTE — Telephone Encounter (Signed)
Error

## 2015-03-20 DIAGNOSIS — K30 Functional dyspepsia: Secondary | ICD-10-CM | POA: Diagnosis not present

## 2015-03-20 DIAGNOSIS — R140 Abdominal distension (gaseous): Secondary | ICD-10-CM | POA: Diagnosis not present

## 2015-03-31 ENCOUNTER — Ambulatory Visit: Payer: Medicare Other | Admitting: Neurology

## 2015-04-02 ENCOUNTER — Encounter: Payer: Self-pay | Admitting: Family Medicine

## 2015-04-02 ENCOUNTER — Ambulatory Visit (INDEPENDENT_AMBULATORY_CARE_PROVIDER_SITE_OTHER): Payer: Medicare Other | Admitting: Family Medicine

## 2015-04-02 VITALS — BP 132/86 | Ht 73.0 in | Wt 210.2 lb

## 2015-04-02 DIAGNOSIS — F329 Major depressive disorder, single episode, unspecified: Secondary | ICD-10-CM

## 2015-04-02 DIAGNOSIS — F32A Depression, unspecified: Secondary | ICD-10-CM

## 2015-04-02 DIAGNOSIS — I2510 Atherosclerotic heart disease of native coronary artery without angina pectoris: Secondary | ICD-10-CM | POA: Diagnosis not present

## 2015-04-02 MED ORDER — ESCITALOPRAM OXALATE 10 MG PO TABS: 10.0000 mg | ORAL_TABLET | Freq: Every day | ORAL | Status: DC

## 2015-04-02 NOTE — Progress Notes (Signed)
   Subjective:    Patient ID: [name] [name], male    DOB: 1941/01/02, 74 y.o.   MRN: [id number] Patient arrives office for follow-up of chronic concerns in new ones also.   Hypertension This is a chronic problem. The current episode started more than 1 year ago. Risk factors for coronary artery disease include dyslipidemia and male gender.   compliant with blood pressure medicine. Numbers stable when checked elsewhere  Compliant with reflux medicine. States it definitely no headache no chest pain no back pain some chronic abdominal pain no change in bowel habits ROS otherwise negative needs it. Without developed significant problems.  Challenge with progressive depression feeling down. No suicidal or homicidal thoughts. A lot of stress lately. Making patient more irritable. Having more difficulty sleeping. Having spells of feeling down   Patient reports problems with   till having trouble sleeping and the Ambien makes him feel terrible.      Review of Systems See above    Objective:   Physical Exam Alert vitals stable. HEENT normal. Lungs clear. Heart rare rhythm. Abdomen benign. Next  Oriented 3 mildly depressed affect somewhat emotional       Assessment & Plan:  Impression depression with element of anxiety discussed at great length including underlying etiology exacerbating factors treatment long-term intervention etc. #2 hypertension good control #3 reflux good control plan 25 minutes spent most in discussion initiate Lexapro rationale discussed recheck in several months exercise strongly encourage WSL

## 2015-04-04 NOTE — Progress Notes (Signed)
Patient ID: [name] [name], male   DOB: 1940/12/12, 74 y.o.   MRN: [id number] [name] [name] is seen today in followup for his coronary artery disease. 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 Olevia Perches I believe back in 1999. His last Myoview in July of 2009 was nonischemic. He has good LV function. His been compliant with his meds. He has a 3.6 x 3.4 cm AAA which needs a followup duplex 9/14 He has not had t any abdominal pain. He is active. 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 He prefers to have his duplex at [facility]. He continues to see Mickie Hillier for his primary care needs.  Told him I would like him to take Toprol for HTN, AAA and PAC;s /SVT  Reviewed event monitor from 5/9-10/22/10 PAC;s and a few short bursts of SVT 4 beats. Low dose Toprol started last visit in June. Patient felt no different on it and stopped it. No palpitations.  In f/u he did not tolerated Toprol or bystolic with multiple side effects.  Interestingly diarrhea occurred with both   Has drug sensitivities to antibiotics also Hesitant to start Ranexa due to interaction with Bactrim which is one of the only antibiotics he can take  Rough summer. 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  Since that has been  Anxious Wife also sick for first time and needed pacemaker.  He is wondering about starting Lexapro .    ROS: Denies fever, malais, weight loss, blurry vision, decreased visual acuity, cough, sputum, SOB, hemoptysis, pleuritic pain, palpitaitons, heartburn, abdominal pain, melena, lower extremity edema, claudication, or rash.  All other systems reviewed and negative  General: Affect appropriate Healthy:  appears stated age 74: normal Neck supple with no adenopathy JVP normal no bruits no thyromegaly Lungs Mild exp wheezing and good diaphragmatic motion Heart:  S1/S2 no murmur, no  rub, gallop or click PMI normal Abdomen: benighn, BS positve, no tenderness, no AAA no bruit.  No HSM or HJR Distal pulses intact with no bruits No edema Neuro non-focal Skin warm and dry No muscular weakness   Current Outpatient Prescriptions  Medication Sig Dispense Refill  . albuterol (PROVENTIL HFA;VENTOLIN HFA) 108 (90 BASE) MCG/ACT inhaler Inhale 2 puffs into the lungs every 6 (six) hours as needed for wheezing. 1 Inhaler 5  . albuterol (PROVENTIL) (2.5 MG/3ML) 0.083% nebulizer solution Take 2.5 mg by nebulization every 6 (six) hours as needed for wheezing or shortness of breath.    . cetirizine (ZYRTEC) 10 MG tablet Take 10 mg by mouth daily.    Marland Kitchen esomeprazole (NEXIUM) 40 MG capsule Take 40 mg by mouth daily before breakfast.      . FLOVENT HFA 220 MCG/ACT inhaler     . lovastatin (MEVACOR) 40 MG tablet Take 40 mg by mouth.    . montelukast (SINGULAIR) 10 MG tablet TAKE ONE TABLET BY MOUTH AT BEDTIME AS NEEDED FOR ALLERGIES. 30 tablet 5  . nitroGLYCERIN (NITROSTAT) 0.4 MG SL tablet Place 1 tablet (0.4 mg total) under the tongue every 5 (five) minutes as needed. 90 tablet 3  . zolpidem (AMBIEN) 5 MG tablet     . escitalopram (LEXAPRO) 10 MG tablet Take 1 tablet (10 mg total) by mouth daily. (Patient not taking: Reported on 04/07/2015) 30 tablet 6   No current facility-administered medications for this visit.  Allergies  Lasix; Cefzil; Dexamethasone; Gabapentin; Neomycin; Tetracyclines & related; Ciprofloxacin; Methocarbamol; and Penicillins  Electrocardiogram:  07/17/12  SR rate 80  Normal 07/04/14   SR rate 77  PAC  Nonspecific ST changes   Assessment and Plan CAD:  Distant PCI RCA 1999 with normal myovue 2009  conitnue medical Rx intolerant to beta blocker  AAA:  F/u duplex needs to be schedule not palpable and no abdominal pain HTN:  Well controlled.  Continue current medications and low sodium Dash type diet.   GERD:  Continue carafate and nexium f/u GI Asthm:  On  inhalers no active wheezing  PAC/SVT:  Stable unable to take beta blockers Anxiety/Depression:  Consider starting lexapro f/u Luking   F/U with me in 6 months  Jenkins Rouge

## 2015-04-06 DIAGNOSIS — F329 Major depressive disorder, single episode, unspecified: Secondary | ICD-10-CM | POA: Insufficient documentation

## 2015-04-06 DIAGNOSIS — F32A Depression, unspecified: Secondary | ICD-10-CM | POA: Insufficient documentation

## 2015-04-07 ENCOUNTER — Ambulatory Visit (INDEPENDENT_AMBULATORY_CARE_PROVIDER_SITE_OTHER): Payer: Medicare Other | Admitting: Cardiovascular Disease

## 2015-04-07 ENCOUNTER — Other Ambulatory Visit: Payer: Self-pay | Admitting: *Deleted

## 2015-04-07 ENCOUNTER — Encounter: Payer: Self-pay | Admitting: Cardiovascular Disease

## 2015-04-07 VITALS — BP 136/80 | HR 79 | Ht 73.0 in | Wt 213.4 lb

## 2015-04-07 DIAGNOSIS — E782 Mixed hyperlipidemia: Secondary | ICD-10-CM

## 2015-04-07 DIAGNOSIS — I714 Abdominal aortic aneurysm, without rupture, unspecified: Secondary | ICD-10-CM

## 2015-04-07 DIAGNOSIS — J329 Chronic sinusitis, unspecified: Secondary | ICD-10-CM | POA: Diagnosis not present

## 2015-04-07 DIAGNOSIS — R040 Epistaxis: Secondary | ICD-10-CM | POA: Diagnosis not present

## 2015-04-07 DIAGNOSIS — I2510 Atherosclerotic heart disease of native coronary artery without angina pectoris: Secondary | ICD-10-CM | POA: Diagnosis not present

## 2015-04-07 NOTE — Patient Instructions (Addendum)
Medication Instructions:  Your physician recommends that you continue on your current medications as directed. Please refer to the Current Medication list given to you today.   Labwork: NONE  Testing/Procedures: Your physician has requested that you have an abdominal aorta duplex. During this test, an ultrasound is used to evaluate the aorta. Allow 30 minutes for this exam. Do not eat after midnight the day before and avoid carbonated beverages  Follow-Up: Your physician wants you to follow-up in: [place] will receive a reminder letter in the mail two months in advance. If you don't receive a letter, please call our office to schedule the follow-up appointment.   Any Other Special Instructions Will Be Listed Below (If Applicable).     If you need a refill on your cardiac medications before your next appointment, please call your pharmacy.

## 2015-04-09 ENCOUNTER — Ambulatory Visit (INDEPENDENT_AMBULATORY_CARE_PROVIDER_SITE_OTHER): Payer: Medicare Other | Admitting: Family Medicine

## 2015-04-09 ENCOUNTER — Encounter: Payer: Self-pay | Admitting: Family Medicine

## 2015-04-09 VITALS — BP 110/74 | Temp 97.7°F | Wt 213.0 lb

## 2015-04-09 DIAGNOSIS — I2510 Atherosclerotic heart disease of native coronary artery without angina pectoris: Secondary | ICD-10-CM | POA: Diagnosis not present

## 2015-04-09 DIAGNOSIS — J310 Chronic rhinitis: Secondary | ICD-10-CM

## 2015-04-09 DIAGNOSIS — J329 Chronic sinusitis, unspecified: Secondary | ICD-10-CM | POA: Diagnosis not present

## 2015-04-09 MED ORDER — SULFAMETHOXAZOLE-TRIMETHOPRIM 800-160 MG PO TABS: 1.0000 | ORAL_TABLET | Freq: Two times a day (BID) | ORAL | Status: DC

## 2015-04-09 NOTE — Progress Notes (Signed)
   Subjective:    Patient ID: [name] [name], male    DOB: [birth year], 74 y.o.   MRN: [id number]  Sinusitis This is a new problem. Episode onset:  3 - 4 weeks. (Runny nose, headache, nose bleeds, sinus pressure, ear pain, ) Treatments tried: mucinex d and saline spray.   Epp, nosebleed at times, eyes involved  mucinex d and saline spary  fronta pressure and headacheno symptoms in chest  Some sore throat worse in the morning   Review of Systems    no fever no chills no rash ROS otherwise negative.   Alert vitals stable mild malaise HEENT moderate nasal congestion nasal irritation plus minus frontal tenderness lungs clear heart regular in rhythm. Objective:   Physical Exam  See above      Assessment & Plan:  Impression acute rhinosinusitis plan antibiotics prescribed. Symptomatic care discussed warning signs discussed WSL

## 2015-04-14 ENCOUNTER — Ambulatory Visit (HOSPITAL_COMMUNITY)
Admission: RE | Admit: 2015-04-14 | Discharge: 2015-04-14 | Disposition: A | Discharge status: Home / Self Care | Payer: Medicare Other | Source: Ambulatory Visit | Attending: Cardiovascular Disease | Admitting: Cardiovascular Disease

## 2015-04-14 ENCOUNTER — Other Ambulatory Visit: Payer: Self-pay | Admitting: Family Medicine

## 2015-04-14 DIAGNOSIS — I714 Abdominal aortic aneurysm, without rupture, unspecified: Secondary | ICD-10-CM

## 2015-04-16 ENCOUNTER — Other Ambulatory Visit: Payer: Self-pay | Admitting: Family Medicine

## 2015-04-21 DIAGNOSIS — Z23 Encounter for immunization: Secondary | ICD-10-CM | POA: Diagnosis not present

## 2015-05-08 ENCOUNTER — Telehealth: Payer: Self-pay | Admitting: Family Medicine

## 2015-05-08 MED ORDER — SULFAMETHOXAZOLE-TRIMETHOPRIM 800-160 MG PO TABS: 1.0000 | ORAL_TABLET | Freq: Two times a day (BID) | ORAL | Status: DC

## 2015-05-08 NOTE — Telephone Encounter (Signed)
sure

## 2015-05-08 NOTE — Telephone Encounter (Signed)
Rx sent electronically to pharmacy. Patient notified. 

## 2015-05-08 NOTE — Telephone Encounter (Signed)
sulfamethoxazole-trimethoprim (BACTRIM DS,SEPTRA DS) 800-160 MG tablet 20 tablet 0 05/08/2015      Sig - Route:  Take 1 tablet by mouth 2 (two) times daily. - Oral    Class:  Normal    DAW:  No    Authorizing Provider:  Mikey Kirschner, MD    Ordering User:  Dairl Ponder, RN

## 2015-05-08 NOTE — Telephone Encounter (Signed)
Prostate issues, wants to know if you can call in the same thing  You gave him in November   [place] [facility]

## 2015-05-20 ENCOUNTER — Ambulatory Visit: Payer: Medicare Other | Admitting: Family Medicine

## 2015-05-20 ENCOUNTER — Ambulatory Visit (INDEPENDENT_AMBULATORY_CARE_PROVIDER_SITE_OTHER): Payer: Medicare Other | Admitting: Family Medicine

## 2015-05-20 ENCOUNTER — Encounter: Payer: Self-pay | Admitting: Family Medicine

## 2015-05-20 VITALS — BP 142/86 | Temp 98.1°F | Ht 73.0 in | Wt 217.4 lb

## 2015-05-20 DIAGNOSIS — R10A0 Flank pain, unspecified side: Secondary | ICD-10-CM

## 2015-05-20 DIAGNOSIS — R109 Unspecified abdominal pain: Secondary | ICD-10-CM

## 2015-05-20 DIAGNOSIS — N410 Acute prostatitis: Secondary | ICD-10-CM | POA: Diagnosis not present

## 2015-05-20 LAB — POCT URINALYSIS DIPSTICK
Blood, UA: NEGATIVE
Leukocytes, UA: NEGATIVE
Spec Grav, UA: 1.015
pH, UA: 5

## 2015-05-20 MED ORDER — CIPROFLOXACIN HCL 500 MG PO TABS: ORAL_TABLET | ORAL | Status: AC

## 2015-05-20 NOTE — Progress Notes (Signed)
   Subjective:    Patient ID: [name] [name], male    DOB: [birth year], 75 y.o.   MRN: [id number]  HPI Patient in today with c/o of lower right sided "dull" pain, with urinary hesitancy. Dull ache low right side,  Trouble urinatint not the best  With some achiness  Did not get better Patient recent had a course of Bactrim that was completed 05/16/15. Took last dose Friday, some urgency, nnot passing as much urine    States no other concerns this visit.  Pt held of  Review of Systems No headache no fever no chills    Objective:   Physical Exam  Alert mild malaise. Lungs clear heart regular rhythm abdomen benign prostate boggy and tender urinalysis 1-2 white blood cells high-power field      Assessment & Plan:  Impression acute prostatitis discussion regarding possible Cipro allergy patient thinks likely not since we are running out of choices time for a retrial plan Cipro twice a day 21 days symptomatic care discussed WSL

## 2015-05-21 ENCOUNTER — Telehealth: Payer: Self-pay | Admitting: Family Medicine

## 2015-05-21 MED ORDER — LEVOFLOXACIN 500 MG PO TABS: 500.0000 mg | ORAL_TABLET | Freq: Every day | ORAL | Status: DC

## 2015-05-21 NOTE — Telephone Encounter (Signed)
Pt states that within an hour of taking the cipro that he was sick as a dog He states that he don't think he can take the doxycycline either because he  Is allergic to tetracyclines   Please advise

## 2015-05-21 NOTE — Telephone Encounter (Signed)
Pt has succeeded in declaring allergy/sensitivity to virtually every know med class. Levaquin 500 qd for 14 d,

## 2015-05-21 NOTE — Telephone Encounter (Signed)
Med sent to pharmacy. Patient was notified.  

## 2015-06-09 ENCOUNTER — Other Ambulatory Visit: Payer: Self-pay | Admitting: Family Medicine

## 2015-06-12 ENCOUNTER — Ambulatory Visit (INDEPENDENT_AMBULATORY_CARE_PROVIDER_SITE_OTHER): Payer: Medicare Other | Admitting: Family Medicine

## 2015-06-12 ENCOUNTER — Encounter: Payer: Self-pay | Admitting: Family Medicine

## 2015-06-12 VITALS — BP 134/86 | Temp 98.7°F | Ht 73.0 in | Wt 217.8 lb

## 2015-06-12 DIAGNOSIS — J329 Chronic sinusitis, unspecified: Secondary | ICD-10-CM

## 2015-06-12 DIAGNOSIS — J310 Chronic rhinitis: Secondary | ICD-10-CM

## 2015-06-12 MED ORDER — SULFAMETHOXAZOLE-TRIMETHOPRIM 800-160 MG PO TABS: ORAL_TABLET | ORAL | Status: DC

## 2015-06-12 NOTE — Progress Notes (Signed)
   Subjective:    Patient ID: [name] [name], male    DOB: 20-Nov-1940, 75 y.o.   MRN: [id number]  Cough This is a new problem. The current episode started in the past 7 days. Associated symptoms include nasal congestion, a sore throat and wheezing. Treatments tried: mucinex,robitussin dm.   Runny nose and cough and cold and congestion Just finished Levaquin yesterday [employer]  Cough off and on,  Using neb u to twice per day Review of Systems  HENT: Positive for sore throat.   Respiratory: Positive for cough and wheezing.        Objective:   Physical Exam Alert mild malaise. Vitals stable. HEENT mild nasal congestion pharynx no erythema neck supple lungs no wheezes no crackles heart regular in rhythm       Assessment & Plan:  Impression viral syndrome possible early sinus element. This patient needs to avoid taking too many antibiotics. We've had conversations about this in the past. Plan Bactrim DS twice a day 10 days if persists encouraged not to use and less it worsens into classic sinus symptoms

## 2015-07-03 ENCOUNTER — Ambulatory Visit: Payer: Medicare Other | Admitting: Family Medicine

## 2015-07-07 ENCOUNTER — Ambulatory Visit (INDEPENDENT_AMBULATORY_CARE_PROVIDER_SITE_OTHER): Payer: Medicare Other | Admitting: Family Medicine

## 2015-07-07 ENCOUNTER — Encounter: Payer: Self-pay | Admitting: Family Medicine

## 2015-07-07 VITALS — BP 130/80 | Ht 73.0 in | Wt 215.2 lb

## 2015-07-07 DIAGNOSIS — K219 Gastro-esophageal reflux disease without esophagitis: Secondary | ICD-10-CM

## 2015-07-07 DIAGNOSIS — D649 Anemia, unspecified: Secondary | ICD-10-CM | POA: Diagnosis not present

## 2015-07-07 DIAGNOSIS — Z125 Encounter for screening for malignant neoplasm of prostate: Secondary | ICD-10-CM

## 2015-07-07 DIAGNOSIS — E785 Hyperlipidemia, unspecified: Secondary | ICD-10-CM | POA: Diagnosis not present

## 2015-07-07 DIAGNOSIS — Z79899 Other long term (current) drug therapy: Secondary | ICD-10-CM

## 2015-07-07 MED ORDER — LOVASTATIN 40 MG PO TABS
80.0000 mg | ORAL_TABLET | Freq: Every day | ORAL | Status: DC
Start: 2015-07-07 — End: 2016-02-18

## 2015-07-07 MED ORDER — RANITIDINE HCL 300 MG PO TABS: 300.0000 mg | ORAL_TABLET | ORAL | Status: DC

## 2015-07-07 NOTE — Progress Notes (Signed)
   Subjective:    Patient ID: [name] [name], male    DOB: [birth year], 75 y.o.   MRN: [id number] Patient arrives office with numerous distinct concerns   Hyperlipidemia This is a chronic problem. The current episode started more than 1 year ago. There are no known factors aggravating his hyperlipidemia. Current antihyperlipidemic treatment includes statins. The current treatment provides moderate improvement of lipids. There are no compliance problems.  There are no known risk factors for coronary artery disease.    Patient states that he has some throat irritation. Onset 1 1/2 weeks ago. Mild resp infxn last couple weeks   Patient states that he has been feeling fatigued for a while also. Pt not sure of why he is tired this much patient does have history of anemia. Recently started back on iron tablet.  Pt has noted fatigue and thought that he might be geeting too much iron.  Uses alb via neb couple times per day. On further history not using his Flovent. Only uses when necessary. Despite using albuterol couple times a day.       Review of Systems No headache no chest pain no back pain no shortness of breath no change in bowel habits no blood in stool ROS otherwise negative    Objective:   Physical Exam  Alert vital stable HEENT slight congestion pharynx normal neck supple lungs clear heart regular rate and rhythm abdomen benign      Assessment & Plan:  Impression 1 nonspecific pharyngitis, post URI #2 reflux patient would like to try to back down to Zantac after bringing up concerns about long-term Nexium No. 3 hyperlipidemia status and certain prior numbers reviewed #4 asthma suboptimum control daily albuterol use not using steroid inhaler regulate plan resume Flovent regularly. Appropriate blood work. To assess anemia along with lipid panel chronic meds refilled recheck in 6 months diet exercise discussed encouraged

## 2015-07-15 ENCOUNTER — Encounter: Payer: Self-pay | Admitting: Family Medicine

## 2015-07-15 ENCOUNTER — Ambulatory Visit (INDEPENDENT_AMBULATORY_CARE_PROVIDER_SITE_OTHER): Payer: Medicare Other | Admitting: Family Medicine

## 2015-07-15 VITALS — BP 132/80 | Temp 98.4°F | Ht 73.0 in | Wt 218.4 lb

## 2015-07-15 DIAGNOSIS — H01002 Unspecified blepharitis right lower eyelid: Secondary | ICD-10-CM | POA: Diagnosis not present

## 2015-07-15 DIAGNOSIS — J029 Acute pharyngitis, unspecified: Secondary | ICD-10-CM

## 2015-07-15 DIAGNOSIS — H01001 Unspecified blepharitis right upper eyelid: Secondary | ICD-10-CM | POA: Diagnosis not present

## 2015-07-15 LAB — POCT RAPID STREP A (OFFICE): Rapid Strep A Screen: NEGATIVE

## 2015-07-15 MED ORDER — FIRST-DUKES MOUTHWASH MT SUSP: OROMUCOSAL | Status: DC

## 2015-07-15 NOTE — Progress Notes (Signed)
   Subjective:    Patient ID: [name] [name], male    DOB: 24-May-1940, 75 y.o.   MRN: [id number]  Sore Throat  This is a new problem. The current episode started in the past 7 days. The problem has been gradually worsening. Neither side of throat is experiencing more pain than the other. He has tried gargles (Cloraseptic Spray) for the symptoms.   Results for orders placed or performed in visit on 07/15/15  POCT rapid strep A  Result Value Ref Range   Rapid Strep A Screen Negative Negative   Pos uncomfortable Felt achey and chilled , pos discomfort  Some headache level not so good   patient states reflux has not acted up lately. Review of Systems     No headache no chest pain no abdominal pain no change in bowel habits ROS otherwise negative Objective:   Physical Exam   alert vital stable HEENT pharynx very slight erythema neck supple lungs clear. Heart regular in rhythm.      Assessment & Plan:   impression 1 pharyngitis subacute very nonspecific negative strep screen. Offered ENT patient not thrilled about that plan takes Magic mouthwash for week if resolves great if persists call and we will set up with ENT

## 2015-07-16 LAB — STREP A DNA PROBE: Strep Gp A Direct, DNA Probe: NEGATIVE

## 2015-07-21 ENCOUNTER — Other Ambulatory Visit: Payer: Self-pay | Admitting: Family Medicine

## 2015-07-21 NOTE — Telephone Encounter (Signed)
May we refill Dukes Magic Mouthwash?

## 2015-07-21 NOTE — Telephone Encounter (Signed)
Six refills singulair two ref d m mw

## 2015-07-25 DIAGNOSIS — Z125 Encounter for screening for malignant neoplasm of prostate: Secondary | ICD-10-CM | POA: Diagnosis not present

## 2015-07-25 DIAGNOSIS — D649 Anemia, unspecified: Secondary | ICD-10-CM | POA: Diagnosis not present

## 2015-07-25 DIAGNOSIS — E785 Hyperlipidemia, unspecified: Secondary | ICD-10-CM | POA: Diagnosis not present

## 2015-07-25 DIAGNOSIS — Z79899 Other long term (current) drug therapy: Secondary | ICD-10-CM | POA: Diagnosis not present

## 2015-07-26 LAB — HEPATIC FUNCTION PANEL
ALT: 21 IU/L (ref 0–44)
AST: 27 IU/L (ref 0–40)
Albumin: 4.3 g/dL (ref 3.5–4.8)
Alkaline Phosphatase: 70 IU/L (ref 39–117)
Bilirubin Total: 0.5 mg/dL (ref 0.0–1.2)
Bilirubin, Direct: 0.16 mg/dL (ref 0.00–0.40)
Total Protein: 7.1 g/dL (ref 6.0–8.5)

## 2015-07-26 LAB — CBC WITH DIFFERENTIAL/PLATELET
Basophils Absolute: 0.1 10*3/uL (ref 0.0–0.2)
Basos: 1 %
EOS (ABSOLUTE): 0.4 10*3/uL (ref 0.0–0.4)
Eos: 5 %
Hematocrit: 42.2 % (ref 37.5–51.0)
Hemoglobin: 13.5 g/dL (ref 12.6–17.7)
Immature Grans (Abs): 0 10*3/uL (ref 0.0–0.1)
Immature Granulocytes: 0 %
Lymphocytes Absolute: 2 10*3/uL (ref 0.7–3.1)
Lymphs: 25 %
MCH: 24.8 pg — ABNORMAL LOW (ref 26.6–33.0)
MCHC: 32 g/dL (ref 31.5–35.7)
MCV: 78 fL — ABNORMAL LOW (ref 79–97)
Monocytes Absolute: 0.5 10*3/uL (ref 0.1–0.9)
Monocytes: 6 %
Neutrophils Absolute: 5 10*3/uL (ref 1.4–7.0)
Neutrophils: 63 %
Platelets: 221 10*3/uL (ref 150–379)
RBC: 5.44 x10E6/uL (ref 4.14–5.80)
RDW: 21.2 % — ABNORMAL HIGH (ref 12.3–15.4)
WBC: 7.9 10*3/uL (ref 3.4–10.8)

## 2015-07-26 LAB — BASIC METABOLIC PANEL
BUN/Creatinine Ratio: 13 (ref 10–22)
BUN: 13 mg/dL (ref 8–27)
CO2: 25 mmol/L (ref 18–29)
Calcium: 9.1 mg/dL (ref 8.6–10.2)
Chloride: 100 mmol/L (ref 96–106)
Creatinine, Ser: 1 mg/dL (ref 0.76–1.27)
GFR calc Af Amer: 86 mL/min/{1.73_m2} (ref >59)
GFR calc non Af Amer: 74 mL/min/{1.73_m2} (ref >59)
Glucose: 116 mg/dL — ABNORMAL HIGH (ref 65–99)
Potassium: 4.7 mmol/L (ref 3.5–5.2)
Sodium: 141 mmol/L (ref 134–144)

## 2015-07-26 LAB — LIPID PANEL
Chol/HDL Ratio: 3.6 ratio units (ref 0.0–5.0)
Cholesterol, Total: 158 mg/dL (ref 100–199)
HDL: 44 mg/dL (ref >39)
LDL Calculated: 92 mg/dL (ref 0–99)
Triglycerides: 111 mg/dL (ref 0–149)
VLDL Cholesterol Cal: 22 mg/dL (ref 5–40)

## 2015-07-26 LAB — PSA: Prostate Specific Ag, Serum: 1.9 ng/mL (ref 0.0–4.0)

## 2015-07-26 LAB — FERRITIN: Ferritin: 29 ng/mL — ABNORMAL LOW (ref 30–400)

## 2015-08-04 ENCOUNTER — Encounter: Payer: Self-pay | Admitting: Family Medicine

## 2015-08-04 DIAGNOSIS — R942 Abnormal results of pulmonary function studies: Secondary | ICD-10-CM | POA: Diagnosis not present

## 2015-08-04 DIAGNOSIS — K117 Disturbances of salivary secretion: Secondary | ICD-10-CM | POA: Diagnosis not present

## 2015-08-04 DIAGNOSIS — J441 Chronic obstructive pulmonary disease with (acute) exacerbation: Secondary | ICD-10-CM | POA: Diagnosis not present

## 2015-08-04 DIAGNOSIS — J3081 Allergic rhinitis due to animal (cat) (dog) hair and dander: Secondary | ICD-10-CM | POA: Diagnosis not present

## 2015-08-04 DIAGNOSIS — J302 Other seasonal allergic rhinitis: Secondary | ICD-10-CM | POA: Diagnosis not present

## 2015-08-04 DIAGNOSIS — K146 Glossodynia: Secondary | ICD-10-CM | POA: Diagnosis not present

## 2015-08-12 DIAGNOSIS — K219 Gastro-esophageal reflux disease without esophagitis: Secondary | ICD-10-CM | POA: Diagnosis not present

## 2015-08-18 DIAGNOSIS — J441 Chronic obstructive pulmonary disease with (acute) exacerbation: Secondary | ICD-10-CM | POA: Diagnosis not present

## 2015-08-18 DIAGNOSIS — R942 Abnormal results of pulmonary function studies: Secondary | ICD-10-CM | POA: Diagnosis not present

## 2015-08-18 DIAGNOSIS — J3081 Allergic rhinitis due to animal (cat) (dog) hair and dander: Secondary | ICD-10-CM | POA: Diagnosis not present

## 2015-08-18 DIAGNOSIS — K117 Disturbances of salivary secretion: Secondary | ICD-10-CM | POA: Diagnosis not present

## 2015-08-18 DIAGNOSIS — J302 Other seasonal allergic rhinitis: Secondary | ICD-10-CM | POA: Diagnosis not present

## 2015-08-18 DIAGNOSIS — K146 Glossodynia: Secondary | ICD-10-CM | POA: Diagnosis not present

## 2015-08-25 DIAGNOSIS — J441 Chronic obstructive pulmonary disease with (acute) exacerbation: Secondary | ICD-10-CM | POA: Diagnosis not present

## 2015-08-25 DIAGNOSIS — K146 Glossodynia: Secondary | ICD-10-CM | POA: Diagnosis not present

## 2015-08-25 DIAGNOSIS — J302 Other seasonal allergic rhinitis: Secondary | ICD-10-CM | POA: Diagnosis not present

## 2015-08-25 DIAGNOSIS — J3081 Allergic rhinitis due to animal (cat) (dog) hair and dander: Secondary | ICD-10-CM | POA: Diagnosis not present

## 2015-08-25 DIAGNOSIS — R942 Abnormal results of pulmonary function studies: Secondary | ICD-10-CM | POA: Diagnosis not present

## 2015-08-25 DIAGNOSIS — K117 Disturbances of salivary secretion: Secondary | ICD-10-CM | POA: Diagnosis not present

## 2015-09-02 DIAGNOSIS — K146 Glossodynia: Secondary | ICD-10-CM | POA: Diagnosis not present

## 2015-09-02 DIAGNOSIS — R942 Abnormal results of pulmonary function studies: Secondary | ICD-10-CM | POA: Diagnosis not present

## 2015-09-02 DIAGNOSIS — J441 Chronic obstructive pulmonary disease with (acute) exacerbation: Secondary | ICD-10-CM | POA: Diagnosis not present

## 2015-09-02 DIAGNOSIS — K117 Disturbances of salivary secretion: Secondary | ICD-10-CM | POA: Diagnosis not present

## 2015-09-02 DIAGNOSIS — J302 Other seasonal allergic rhinitis: Secondary | ICD-10-CM | POA: Diagnosis not present

## 2015-09-02 DIAGNOSIS — J3081 Allergic rhinitis due to animal (cat) (dog) hair and dander: Secondary | ICD-10-CM | POA: Diagnosis not present

## 2015-09-09 DIAGNOSIS — J441 Chronic obstructive pulmonary disease with (acute) exacerbation: Secondary | ICD-10-CM | POA: Diagnosis not present

## 2015-09-09 DIAGNOSIS — R942 Abnormal results of pulmonary function studies: Secondary | ICD-10-CM | POA: Diagnosis not present

## 2015-09-09 DIAGNOSIS — K146 Glossodynia: Secondary | ICD-10-CM | POA: Diagnosis not present

## 2015-09-09 DIAGNOSIS — J3081 Allergic rhinitis due to animal (cat) (dog) hair and dander: Secondary | ICD-10-CM | POA: Diagnosis not present

## 2015-09-09 DIAGNOSIS — K117 Disturbances of salivary secretion: Secondary | ICD-10-CM | POA: Diagnosis not present

## 2015-09-09 DIAGNOSIS — J302 Other seasonal allergic rhinitis: Secondary | ICD-10-CM | POA: Diagnosis not present

## 2015-09-12 ENCOUNTER — Other Ambulatory Visit: Payer: Self-pay | Admitting: Family Medicine

## 2015-09-16 DIAGNOSIS — J3081 Allergic rhinitis due to animal (cat) (dog) hair and dander: Secondary | ICD-10-CM | POA: Diagnosis not present

## 2015-09-16 DIAGNOSIS — J441 Chronic obstructive pulmonary disease with (acute) exacerbation: Secondary | ICD-10-CM | POA: Diagnosis not present

## 2015-09-16 DIAGNOSIS — K146 Glossodynia: Secondary | ICD-10-CM | POA: Diagnosis not present

## 2015-09-16 DIAGNOSIS — J302 Other seasonal allergic rhinitis: Secondary | ICD-10-CM | POA: Diagnosis not present

## 2015-09-16 DIAGNOSIS — K117 Disturbances of salivary secretion: Secondary | ICD-10-CM | POA: Diagnosis not present

## 2015-09-16 DIAGNOSIS — R942 Abnormal results of pulmonary function studies: Secondary | ICD-10-CM | POA: Diagnosis not present

## 2015-09-23 DIAGNOSIS — J302 Other seasonal allergic rhinitis: Secondary | ICD-10-CM | POA: Diagnosis not present

## 2015-09-23 DIAGNOSIS — J3081 Allergic rhinitis due to animal (cat) (dog) hair and dander: Secondary | ICD-10-CM | POA: Diagnosis not present

## 2015-09-23 DIAGNOSIS — R942 Abnormal results of pulmonary function studies: Secondary | ICD-10-CM | POA: Diagnosis not present

## 2015-09-23 DIAGNOSIS — K117 Disturbances of salivary secretion: Secondary | ICD-10-CM | POA: Diagnosis not present

## 2015-09-23 DIAGNOSIS — J441 Chronic obstructive pulmonary disease with (acute) exacerbation: Secondary | ICD-10-CM | POA: Diagnosis not present

## 2015-09-23 DIAGNOSIS — K146 Glossodynia: Secondary | ICD-10-CM | POA: Diagnosis not present

## 2015-09-29 NOTE — Progress Notes (Signed)
Patient ID: [name] [name], male   DOB: [birth year], 75 y.o.   MRN: [id number]   [name] [name] is seen today in followup for his coronary artery disease. 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 Olevia Perches I believe back in 1999. His last Myoview in July of 2009 was nonischemic. He has good LV function. His been compliant with his meds. He has a 3.6 x 3.4 cm AAA which needs a followup duplex 9/14 He has not had t any abdominal pain. He is active. 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 He prefers to have his duplex at [facility]. He continues to see Mickie Hillier for his primary care needs.    Event monitor  2012 PAC;s and a few short bursts of SVT 4 beats. Low dose Toprol started  Patient felt no different on it and stopped it. No palpitations.  Indicates intolerance to beta blockers toprol and bystolic .  Interestingly diarrhea occurred with both   Has drug sensitivities to antibiotics also Hesitant to start Ranexa due to interaction with Bactrim which is one of the only antibiotics he can take  Rough 2016 . 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  Since that has been  Anxious Wife also sick for first time and needed pacemaker.  He is wondering about starting Lexapro  Also been on coreg, atenolol cardizem CD and ranexa that he did not tolerate  BP seems high  .    ROS: Denies fever, malais, weight loss, blurry vision, decreased visual acuity, cough, sputum, SOB, hemoptysis, pleuritic pain, palpitaitons, heartburn, abdominal pain, melena, lower extremity edema, claudication, or rash.  All other systems reviewed and negative  General: Affect appropriate Healthy:  appears stated age 72: normal Neck supple with no adenopathy JVP normal no bruits no thyromegaly Lungs Mild exp wheezing and good diaphragmatic motion Heart:  S1/S2 no murmur, no rub, gallop or  click PMI normal Abdomen: benighn, BS positve, no tenderness, no AAA no bruit.  No HSM or HJR Distal pulses intact with no bruits No edema Neuro non-focal Skin warm and dry No muscular weakness   Current Outpatient Prescriptions  Medication Sig Dispense Refill  . albuterol (PROVENTIL) (2.5 MG/3ML) 0.083% nebulizer solution Take 2.5 mg by nebulization every 6 (six) hours as needed for wheezing or shortness of breath. Reported on 07/15/2015    . ALPRAZolam (XANAX) 0.5 MG tablet Take 0.5 mg by mouth at bedtime.    . cetirizine (ZYRTEC) 10 MG tablet Take 10 mg by mouth daily.    . Diphenhyd-Hydrocort-Nystatin (FIRST-DUKES MOUTHWASH) SUSP Gargle and spit 1 table spoon four times a day. 1 Bottle 0  . FLOVENT HFA 220 MCG/ACT inhaler INHALE 2 PUFFS INTO LUNGS 2 TIMES DAILY. 12 g 5  . levofloxacin (LEVAQUIN) 500 MG tablet Take 1 tablet (500 mg total) by mouth daily. For 14 days 14 tablet 0  . lovastatin (MEVACOR) 40 MG tablet Take 2 tablets (80 mg total) by mouth daily. 60 tablet 5  . Misc Natural Products (GLUCOSAMINE CHONDROITIN ADV PO) Take 1 tablet by mouth daily.    . montelukast (SINGULAIR) 10 MG tablet TAKE ONE TABLET BY MOUTH AT BEDTIME AS NEEDED FOR ALLERGIES. 30 tablet 5  . nitroGLYCERIN (NITROSTAT) 0.4 MG SL tablet Place 1 tablet (0.4 mg total) under the tongue every 5 (five) minutes as needed. 90 tablet 3  .  nystatin (MYCOSTATIN) 100000 UNIT/ML suspension GARGLE AND SPIT WITH 15ML BY MOUTH FOUR TIMES DAILY. 240 mL 0  . PROAIR HFA 108 (90 BASE) MCG/ACT inhaler INHALE 2 PUFFS INTO THE LUNGS EVERY SIX HOURS AS NEEDED FOR WHEEZING. 8.5 g 5  . Probiotic Product (PROBIOTIC ADVANCED PO) Take 1 tablet by mouth daily.    . ranitidine (ZANTAC) 300 MG tablet Take 1 tablet (300 mg total) by mouth every morning. 30 tablet 11  . sulfamethoxazole-trimethoprim (BACTRIM DS,SEPTRA DS) 800-160 MG tablet Take 1 tablet by mouth 2 (two) times daily. 20 tablet 0  . sulfamethoxazole-trimethoprim (BACTRIM  DS,SEPTRA DS) 800-160 MG tablet Take 1 tablet by mouth twice a day for 10 days 20 tablet 0  . losartan (COZAAR) 25 MG tablet Take 1 tablet (25 mg total) by mouth daily. 90 tablet 3   No current facility-administered medications for this visit.    Allergies  Lasix; Cefzil; Dexamethasone; Gabapentin; Neomycin; Tetracyclines & related; Methocarbamol; and Penicillins  Electrocardiogram:  07/17/12  SR rate 80  Normal 07/04/14   SR rate 77  PAC  Nonspecific ST changes  10/06/15  SR ate 90 PAC nonspecific ST changes   Assessment and Plan CAD:  Distant PCI RCA 1999 with normal myovue 2009  conitnue medical Rx intolerant to beta blocker  AAA:  3.9 cm 03/2015 fu duplex later this year at [facility] HTN:  Add cozaar 25 mg f/u with me next available [facility]   GERD:  Continue carafate and nexium f/u GI Asthm:  On inhalers no active wheezing  PAC/SVT:  Stable unable to take beta blockers Anxiety/Depression:  Better chose not to start SSRI  Wife's health is better which helps   F/U with me [facility] next available   [employer]

## 2015-09-30 DIAGNOSIS — J302 Other seasonal allergic rhinitis: Secondary | ICD-10-CM | POA: Diagnosis not present

## 2015-09-30 DIAGNOSIS — J3081 Allergic rhinitis due to animal (cat) (dog) hair and dander: Secondary | ICD-10-CM | POA: Diagnosis not present

## 2015-09-30 DIAGNOSIS — K117 Disturbances of salivary secretion: Secondary | ICD-10-CM | POA: Diagnosis not present

## 2015-09-30 DIAGNOSIS — R942 Abnormal results of pulmonary function studies: Secondary | ICD-10-CM | POA: Diagnosis not present

## 2015-09-30 DIAGNOSIS — J441 Chronic obstructive pulmonary disease with (acute) exacerbation: Secondary | ICD-10-CM | POA: Diagnosis not present

## 2015-09-30 DIAGNOSIS — K146 Glossodynia: Secondary | ICD-10-CM | POA: Diagnosis not present

## 2015-10-02 ENCOUNTER — Other Ambulatory Visit: Payer: Self-pay | Admitting: Family Medicine

## 2015-10-06 ENCOUNTER — Ambulatory Visit (INDEPENDENT_AMBULATORY_CARE_PROVIDER_SITE_OTHER): Payer: Medicare Other | Admitting: Family Medicine

## 2015-10-06 ENCOUNTER — Encounter: Payer: Self-pay | Admitting: Cardiovascular Disease

## 2015-10-06 ENCOUNTER — Ambulatory Visit (INDEPENDENT_AMBULATORY_CARE_PROVIDER_SITE_OTHER): Payer: Medicare Other | Admitting: Cardiovascular Disease

## 2015-10-06 ENCOUNTER — Encounter: Payer: Self-pay | Admitting: Family Medicine

## 2015-10-06 VITALS — BP 134/86 | Temp 97.7°F | Ht 73.0 in | Wt 216.2 lb

## 2015-10-06 VITALS — BP 140/92 | HR 92 | Ht 73.0 in | Wt 217.8 lb

## 2015-10-06 DIAGNOSIS — E782 Mixed hyperlipidemia: Secondary | ICD-10-CM | POA: Diagnosis not present

## 2015-10-06 DIAGNOSIS — J029 Acute pharyngitis, unspecified: Secondary | ICD-10-CM

## 2015-10-06 DIAGNOSIS — J312 Chronic pharyngitis: Secondary | ICD-10-CM

## 2015-10-06 LAB — POCT RAPID STREP A (OFFICE): Rapid Strep A Screen: NEGATIVE

## 2015-10-06 MED ORDER — LOSARTAN POTASSIUM 25 MG PO TABS: 25.0000 mg | ORAL_TABLET | Freq: Every day | ORAL | Status: DC

## 2015-10-06 MED ORDER — FIRST-DUKES MOUTHWASH MT SUSP: OROMUCOSAL | Status: DC

## 2015-10-06 NOTE — Progress Notes (Signed)
   Subjective:    Patient ID: [name] [name], male    DOB: 09/11/1940, 75 y.o.   MRN: [id number]  Sore Throat  This is a recurrent problem. The current episode started more than 1 month ago. Associated symptoms include coughing. Treatments tried: Nystatin.   strep screen negative   Pt had dry mouth, pt was prescribed dry mouth,    Pt had sig allergy testing  inflammed throat and having difficulties with pain and discomfort  Saw ENT several times and now has "lost confidence in "   Patient states no other concerns this visit.  Stayed on zyrtec and added flonase spray    patient is concerned about potential for more serious causes to his chronic sore throat. Patient's been getting this off-and-on for quite a few months now. He reports the ENT did not do an indirect laryngoscopy.   He reports ENT more focus on allergies.   Review of Systems  Respiratory: Positive for cough.    no vomiting no diarrhea no headache no loss of consciousness     Objective:   Physical Exam  alert anxious appearing no acute distress tympanic membrane is normal pharynx very slight erythema at most neck supple no palpable adenopathy lungs clear. Heart rare rhythm.       Assessment & Plan:   impression chronic intermittent throat discomfort. Challenging presentation. Symptomatic at times and other times not. Patient worried about potential more serious etiology. Very long discussion held. Notes current allergy shots not helping from current ENT specialist plan referral to Dr. TO. Dukes Magic mouthwash. No culture done this is negative symptom care discussed easily 25 minutes spent most in discussion

## 2015-10-06 NOTE — Patient Instructions (Signed)
Medication Instructions:  Your physician has recommended you make the following change in your medication:  1-START Cozaar 25 mg by mouth daily.  Labwork: NONE  Testing/Procedures: NONE  Follow-Up: Your physician wants you to follow-up on June 14th or 15th at [place] office with Dr. Johnsie Cancel to check BP.   If you need a refill on your cardiac medications before your next appointment, please call your pharmacy.

## 2015-10-07 LAB — STREP A DNA PROBE: Strep Gp A Direct, DNA Probe: NEGATIVE

## 2015-10-08 ENCOUNTER — Encounter: Payer: Self-pay | Admitting: Family Medicine

## 2015-10-14 DIAGNOSIS — K117 Disturbances of salivary secretion: Secondary | ICD-10-CM | POA: Diagnosis not present

## 2015-10-14 DIAGNOSIS — K146 Glossodynia: Secondary | ICD-10-CM | POA: Diagnosis not present

## 2015-10-14 DIAGNOSIS — R942 Abnormal results of pulmonary function studies: Secondary | ICD-10-CM | POA: Diagnosis not present

## 2015-10-14 DIAGNOSIS — J441 Chronic obstructive pulmonary disease with (acute) exacerbation: Secondary | ICD-10-CM | POA: Diagnosis not present

## 2015-10-14 DIAGNOSIS — J3081 Allergic rhinitis due to animal (cat) (dog) hair and dander: Secondary | ICD-10-CM | POA: Diagnosis not present

## 2015-10-14 DIAGNOSIS — J302 Other seasonal allergic rhinitis: Secondary | ICD-10-CM | POA: Diagnosis not present

## 2015-10-21 DIAGNOSIS — J302 Other seasonal allergic rhinitis: Secondary | ICD-10-CM | POA: Diagnosis not present

## 2015-10-21 DIAGNOSIS — B370 Candidal stomatitis: Secondary | ICD-10-CM | POA: Diagnosis not present

## 2015-10-21 DIAGNOSIS — J441 Chronic obstructive pulmonary disease with (acute) exacerbation: Secondary | ICD-10-CM | POA: Diagnosis not present

## 2015-10-21 DIAGNOSIS — K117 Disturbances of salivary secretion: Secondary | ICD-10-CM | POA: Diagnosis not present

## 2015-10-21 DIAGNOSIS — K146 Glossodynia: Secondary | ICD-10-CM | POA: Diagnosis not present

## 2015-10-21 DIAGNOSIS — J3081 Allergic rhinitis due to animal (cat) (dog) hair and dander: Secondary | ICD-10-CM | POA: Diagnosis not present

## 2015-10-21 DIAGNOSIS — R942 Abnormal results of pulmonary function studies: Secondary | ICD-10-CM | POA: Diagnosis not present

## 2015-10-22 ENCOUNTER — Emergency Department (HOSPITAL_COMMUNITY)
Admission: EM | Admit: 2015-10-22 | Discharge: 2015-10-23 | Disposition: A | Discharge status: Home / Self Care | Payer: Medicare Other | Attending: Emergency Medicine | Admitting: Emergency Medicine

## 2015-10-22 ENCOUNTER — Encounter (HOSPITAL_COMMUNITY): Payer: Self-pay | Admitting: Emergency Medicine

## 2015-10-22 ENCOUNTER — Encounter: Payer: Self-pay | Admitting: Cardiovascular Disease

## 2015-10-22 DIAGNOSIS — Z79899 Other long term (current) drug therapy: Secondary | ICD-10-CM | POA: Insufficient documentation

## 2015-10-22 DIAGNOSIS — I2510 Atherosclerotic heart disease of native coronary artery without angina pectoris: Secondary | ICD-10-CM | POA: Diagnosis not present

## 2015-10-22 DIAGNOSIS — Z87891 Personal history of nicotine dependence: Secondary | ICD-10-CM | POA: Diagnosis not present

## 2015-10-22 DIAGNOSIS — I10 Essential (primary) hypertension: Secondary | ICD-10-CM | POA: Insufficient documentation

## 2015-10-22 DIAGNOSIS — J45909 Unspecified asthma, uncomplicated: Secondary | ICD-10-CM | POA: Diagnosis not present

## 2015-10-22 DIAGNOSIS — I252 Old myocardial infarction: Secondary | ICD-10-CM | POA: Insufficient documentation

## 2015-10-22 DIAGNOSIS — R51 Headache: Secondary | ICD-10-CM | POA: Diagnosis not present

## 2015-10-22 DIAGNOSIS — J029 Acute pharyngitis, unspecified: Secondary | ICD-10-CM | POA: Diagnosis not present

## 2015-10-22 DIAGNOSIS — R040 Epistaxis: Secondary | ICD-10-CM | POA: Diagnosis not present

## 2015-10-22 DIAGNOSIS — R1310 Dysphagia, unspecified: Secondary | ICD-10-CM | POA: Insufficient documentation

## 2015-10-22 DIAGNOSIS — Z7982 Long term (current) use of aspirin: Secondary | ICD-10-CM | POA: Insufficient documentation

## 2015-10-22 DIAGNOSIS — J449 Chronic obstructive pulmonary disease, unspecified: Secondary | ICD-10-CM | POA: Insufficient documentation

## 2015-10-22 LAB — BASIC METABOLIC PANEL
Anion gap: 5 (ref 5–15)
BUN: 13 mg/dL (ref 6–20)
CO2: 28 mmol/L (ref 22–32)
Calcium: 8.8 mg/dL — ABNORMAL LOW (ref 8.9–10.3)
Chloride: 106 mmol/L (ref 101–111)
Creatinine, Ser: 0.8 mg/dL (ref 0.61–1.24)
GFR calc Af Amer: >60 mL/min (ref >60)
GFR calc non Af Amer: >60 mL/min (ref >60)
Glucose, Bld: 113 mg/dL — ABNORMAL HIGH (ref 65–99)
Potassium: 3.6 mmol/L (ref 3.5–5.1)
Sodium: 139 mmol/L (ref 135–145)

## 2015-10-22 LAB — CBC
HCT: 41.6 % (ref 39.0–52.0)
Hemoglobin: 13.6 g/dL (ref 13.0–17.0)
MCH: 28.2 pg (ref 26.0–34.0)
MCHC: 32.7 g/dL (ref 30.0–36.0)
MCV: 86.3 fL (ref 78.0–100.0)
Platelets: 156 10*3/uL (ref 150–400)
RBC: 4.82 MIL/uL (ref 4.22–5.81)
RDW: 15.6 % — ABNORMAL HIGH (ref 11.5–15.5)
WBC: 8.2 10*3/uL (ref 4.0–10.5)

## 2015-10-22 NOTE — ED Notes (Signed)
Pt c/o nosebleed x 15 minutes.

## 2015-10-22 NOTE — ED Provider Notes (Signed)
CSN: [id number]     Arrival date & time 10/22/15  2125 History   . By signing my name below, I, Hansel Feinstein and Tad Moore, attest that this documentation has been prepared under the direction and in the presence of Fredia Sorrow, MD. Electronically Signed: Hansel Feinstein and Tad Moore, ED Scribe. 10/22/2015. 10:39 PM.     Chief Complaint  Patient presents with  . Epistaxis    Patient is a 75 y.o. male presenting with nosebleeds. The history is provided by the patient. No language interpreter was used.  Epistaxis Location:  R nare Severity:  Mild Duration:  2 hours Timing:  Intermittent Progression:  Resolved Chronicity:  Recurrent Context: aspirin use   Relieved by:  Nothing Worsened by:  Nothing tried Ineffective treatments:  Nasal tampon Associated symptoms: headaches and sore throat   Associated symptoms: no congestion, no cough and no fever   Risk factors: intranasal steroids ( d/c 2 days ago )   HPI Comments: [name] [name] is a 75 y.o. male with a PMHx of epistaxis, AAA who presents to the Emergency Department complaining of sudden onset, improving right nare nosebleed onset 2 hours ago while at rest. Pt reports temporary relief with the application of tissue before the bleeding worsened again. The pt recently used flonase two days ago, and reports this medication has exacerbated nosebleeds in the past.The patient is on 81 mg of qd aspirin but on no other anticoagulants. Pt also reports he has been experiencing dull HA, sore throat and trouble swallowing with his recurring epistaxis, and notes he is being followed for ENT for this issue. Pt denies fever, congestion, rhinorrhea, visual disturbance, cough, CP, leg swelling, abdominal pain, diarrhea, nausea, vomiting, dysuria, hematuria, neck pain, rash, bruising easily.    Past Medical History  Diagnosis Date  . AAA (abdominal aortic aneurysm) ([place])     needs yearly ultrasound  . Hypertension   . COPD (chronic obstructive  pulmonary disease) ([place])   . Hypercholesterolemia   . MI (myocardial infarction) ([place]) 1999  . BPH (benign prostatic hyperplasia)   . GERD (gastroesophageal reflux disease)   . Arthritis   . Glaucoma   . Impaired fasting glucose   . Allergy   . Asthma   . CAD (coronary artery disease)   . HOH (hard of hearing)   . Cancer ([place])     skin cancer  . Anemia   . Dysrhythmia     pt. states it can be fast at times  . Low back pain   . Thrush    Past Surgical History  Procedure Laterality Date  . Cardiac catheterization      angioplasty  . Back surgery      x 3  . Right eye detached retina Bilateral   . Cholecystectomy  2000  . Cataract extraction w/phaco  03/20/2012    Procedure: CATARACT EXTRACTION PHACO AND INTRAOCULAR LENS PLACEMENT (IOC);  Surgeon: Williams Che, MD;  Location: [facility];  Service: Ophthalmology;  Laterality: Right;  CDE:  8.45  . Colonoscopy  2009    repeat 5 years  . Cataract extraction w/phaco Left 04/02/2013    Procedure: CATARACT EXTRACTION PHACO AND INTRAOCULAR LENS PLACEMENT (IOC);  Surgeon: Williams Che, MD;  Location: [facility];  Service: Ophthalmology;  Laterality: Left;  CDE:  6.50  . Laparoscopic partial colectomy N/A 06/11/2013    Procedure: LAPAROSCOPIC HAND ASSISTED PARTIAL COLECTOMY;  Surgeon: Jamesetta So, MD;  Location: [facility];  Service: General;  Laterality: N/A;  . Yag laser application Left [id number]    Procedure: YAG LASER APPLICATION;  Surgeon: Williams Che, MD;  Location: [facility];  Service: Ophthalmology;  Laterality: Left;  . Esophagogastroduodenoscopy    . Hernia repair Left     inguinal   Family History  Problem Relation Age of Onset  . Hypertension Mother   . COPD Father   . Cancer Brother     brain   Social History  Substance Use Topics  . Smoking status: Former Smoker -- 1.00 packs/day for 35 years    Types: Cigarettes    Quit date: 03/28/2003  . Smokeless tobacco: None  . Alcohol Use: No    Review of Systems   Constitutional: Negative for fever and chills.  HENT: Positive for nosebleeds, sore throat and trouble swallowing. Negative for congestion and rhinorrhea.   Eyes: Negative for visual disturbance.  Respiratory: Positive for shortness of breath (baseline). Negative for cough.   Cardiovascular: Negative for chest pain and leg swelling.  Gastrointestinal: Negative for nausea, vomiting, abdominal pain and diarrhea.  Genitourinary: Negative for dysuria and hematuria.  Musculoskeletal: Positive for back pain (baseline ). Negative for neck pain.  Skin: Negative for rash.  Neurological: Positive for headaches.  Hematological: Does not bruise/bleed easily.  Psychiatric/Behavioral: Negative for confusion.      Allergies  Lasix; Cefzil; Dexamethasone; Gabapentin; Neomycin; Tetracyclines & related; Methocarbamol; and Penicillins  Home Medications   Prior to Admission medications   Medication Sig Start Date End Date Taking? Authorizing Provider  albuterol (PROVENTIL) (2.5 MG/3ML) 0.083% nebulizer solution Take 2.5 mg by nebulization every 6 (six) hours as needed for wheezing or shortness of breath. Reported on 07/15/2015   Yes Historical Provider, MD  aspirin EC 81 MG tablet Take 81 mg by mouth daily.   Yes Historical Provider, MD  cetirizine (ZYRTEC) 10 MG tablet Take 10 mg by mouth daily.   Yes Historical Provider, MD  esomeprazole (NEXIUM) 20 MG capsule Take 20 mg by mouth daily at 12 noon.   Yes Historical Provider, MD  ferrous sulfate 325 (65 FE) MG tablet Take 325 mg by mouth daily.   Yes Historical Provider, MD  FLOVENT HFA 220 MCG/ACT inhaler INHALE 2 PUFFS INTO LUNGS 2 TIMES DAILY. Patient taking differently: INHALE 2 PUFFS INTO LUNGS 2 TIMES DAILY AS NEEDED FOR SHORTNESS OF BREATH 04/14/15  Yes Kathyrn Drown, MD  fluticasone (FLONASE) 50 MCG/ACT nasal spray Place 1 spray into both nostrils daily.  08/18/15  Yes Historical Provider, MD  losartan (COZAAR) 25 MG tablet Take 1 tablet (25 mg  total) by mouth daily. 10/06/15  Yes Josue Hector, MD  lovastatin (MEVACOR) 40 MG tablet Take 2 tablets (80 mg total) by mouth daily. Patient taking differently: Take 40 mg by mouth daily.  07/07/15  Yes Mikey Kirschner, MD  montelukast (SINGULAIR) 10 MG tablet TAKE ONE TABLET BY MOUTH AT BEDTIME AS NEEDED FOR ALLERGIES. Patient taking differently: TAKE ONE TABLET BY MOUTH DAILY AS NEEDED FOR ALLERGIES. 07/21/15  Yes Mikey Kirschner, MD  Multiple Vitamin (MULTIVITAMIN WITH MINERALS) TABS tablet Take 1 tablet by mouth daily.   Yes Historical Provider, MD  nitroGLYCERIN (NITROSTAT) 0.4 MG SL tablet Place 1 tablet (0.4 mg total) under the tongue every 5 (five) minutes as needed. 11/04/10  Yes Josue Hector, MD  nystatin (MYCOSTATIN) 100000 UNIT/ML suspension GARGLE AND SPIT WITH 15ML BY MOUTH FOUR TIMES DAILY. 10/02/15  Yes Kathyrn Drown, MD  Probiotic Product (PROBIOTIC  ADVANCED PO) Take 1 tablet by mouth daily.   Yes Historical Provider, MD  RA GLUCOSAMINE-CHONDROITIN PO Take 1 tablet by mouth daily.   Yes Historical Provider, MD  Diphenhyd-Hydrocort-Nystatin (FIRST-DUKES MOUTHWASH) SUSP Gargle and spit 1 table spoon four times a day. Patient not taking: Reported on 10/22/2015 10/06/15   Mikey Kirschner, MD   BP 139/85 mmHg  Pulse 71  Temp(Src) 98.2 F (36.8 C) (Oral)  Resp 16  Ht 6\' 1"  (1.854 m)  Wt 95.255 kg  BMI 27.71 kg/m2  SpO2 90% Physical Exam  Constitutional: He is oriented to person, place, and time. He appears well-developed and well-nourished.  HENT:  Head: Normocephalic.  No active bleeding in left nare Epistaxis in right nare with clotting. No left nare clotting  Eyes: Conjunctivae and EOM are normal. Pupils are equal, round, and reactive to light. No scleral icterus.  Cardiovascular: Normal rate and regular rhythm.   No murmur heard. Pulmonary/Chest: Effort normal. No respiratory distress.  Lungs CTA bilaterally.   Abdominal: Bowel sounds are normal. He exhibits no  distension.  Musculoskeletal: Normal range of motion. He exhibits no edema.  Neurological: He is alert and oriented to person, place, and time. He has normal reflexes. No cranial nerve deficit. He exhibits normal muscle tone. Coordination normal.  Skin: Skin is warm and dry.  Psychiatric: He has a normal mood and affect. His behavior is normal.  Nursing note and vitals reviewed.   ED Course  Procedures (including critical care time) DIAGNOSTIC STUDIES: Oxygen Saturation is 94% on RA, normal by my interpretation.    COORDINATION OF CARE: 10:17 PM Discussed treatment plan with pt at bedside which includes blood work, at home pinching technique, or rhinorocket is symptoms persist beyond 15 min. Pt agreed to plan.   Labs Review Labs Reviewed  CBC - Abnormal; Notable for the following:    RDW 15.6 (*)    All other components within normal limits  BASIC METABOLIC PANEL - Abnormal; Notable for the following:    Glucose, Bld 113 (*)    Calcium 8.8 (*)    All other components within normal limits    Imaging Review No results found. I have personally reviewed and evaluated these images and lab results as part of my medical decision-making.   EKG Interpretation None      MDM   Final diagnoses:  Right-sided nosebleed   Bleeding from the right nares controlled. Patient given instructions withtechnique and what the follow-up. Blood counts were fine here. Patient has follow-up with Dr. TO. Patient will return for any recurrent nosebleed. Had discussion about the placement of the [facility] patient would rather avoid that at this time and since he is not actively bleeding it is not mandatory.  Patient has used Flonase recently it may be the culprit. Patient does have a history of the hypertension so using Afrin is not appropriate.  I personally performed the services described in this documentation, which was scribed in my presence. The recorded information has been reviewed and is  accurate.      Fredia Sorrow, MD 10/23/15 [phone]

## 2015-10-23 ENCOUNTER — Ambulatory Visit (INDEPENDENT_AMBULATORY_CARE_PROVIDER_SITE_OTHER): Payer: Medicare Other | Admitting: Otolaryngology

## 2015-10-23 DIAGNOSIS — R070 Pain in throat: Secondary | ICD-10-CM

## 2015-10-23 DIAGNOSIS — R040 Epistaxis: Secondary | ICD-10-CM

## 2015-10-23 DIAGNOSIS — R490 Dysphonia: Secondary | ICD-10-CM

## 2015-10-23 NOTE — Discharge Instructions (Signed)
Nosebleed Nosebleeds are common. A nosebleed can be caused by many things, including:  Getting hit hard in the nose.  Infections.  Dryness in your nose.  A dry climate.  Medicines.  Picking your nose.  Your home heating and cooling systems. HOME CARE   Try controlling your nosebleed by pinching your nostrils gently. Do this for at least 10 minutes.  Avoid blowing or sniffing your nose for a number of hours after having a nosebleed.  Do not put gauze inside of your nose yourself. If your nose was packed by your doctor, try to keep the pack inside of your nose until your doctor removes it.  If a gauze pack was used and it starts to fall out, gently replace it or cut off the end of it.  If a balloon catheter was used to pack your nose, do not cut or remove it unless told by your doctor.  Avoid lying down while you are having a nosebleed. Sit up and lean forward.  Use a nasal spray decongestant to help with a nosebleed as told by your doctor.  Do not use petroleum jelly or mineral oil in your nose. These can drip into your lungs.  Keep your house humid by using:  Less air conditioning.  A humidifier.  Aspirin and blood thinners make bleeding more likely. If you are prescribed these medicines and you have nosebleeds, ask your doctor if you should stop taking the medicines or adjust the dose. Do not stop medicines unless told by your doctor.  Resume your normal activities as you are able. Avoid straining, lifting, or bending at your waist for several days.  If your nosebleed was caused by dryness in your nose, use over-the-counter saline nasal spray or gel. If you must use a lubricant:  Choose one that is water-soluble.  Use it only as needed.  Do not use it within several hours of lying down.  Keep all follow-up visits as told by your doctor. This is important. GET HELP IF:  You have a fever.  You get frequent nosebleeds.  You are getting nosebleeds more  often. GET HELP RIGHT AWAY IF:  Your nosebleed lasts longer than 20 minutes.  Your nosebleed occurs after an injury to your face, and your nose looks crooked or broken.  You have unusual bleeding from other parts of your body.  You have unusual bruising on other parts of your body.  You feel light-headed or dizzy.  You become sweaty.  You throw up (vomit) blood.  You have a nosebleed after a head injury.   This information is not intended to replace advice given to you by your health care provider. Make sure you discuss any questions you have with your health care provider.  Follow nosebleed instructions that we went over. If unable to stop with the pinching technique and blowing the clots out then returned. Blood counts here today were fine. Make an appointment to follow-up with ear nose and throat.   Document Released: 02/10/2008 Document Revised: 05/24/2014 Document Reviewed: 12/17/2013 Elsevier Interactive Patient Education [employer].

## 2015-10-27 DIAGNOSIS — G609 Hereditary and idiopathic neuropathy, unspecified: Secondary | ICD-10-CM | POA: Diagnosis not present

## 2015-10-27 DIAGNOSIS — M4316 Spondylolisthesis, lumbar region: Secondary | ICD-10-CM | POA: Diagnosis not present

## 2015-10-27 DIAGNOSIS — Z6827 Body mass index (BMI) 27.0-27.9, adult: Secondary | ICD-10-CM | POA: Diagnosis not present

## 2015-10-27 DIAGNOSIS — I10 Essential (primary) hypertension: Secondary | ICD-10-CM | POA: Diagnosis not present

## 2015-10-29 NOTE — Progress Notes (Signed)
Patient ID: [name] [name], male   DOB: December 07, 1940, 75 y.o.   MRN: [id number]   [name] [name] is seen today in followup for his coronary artery disease. 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 Olevia Perches I believe back in 1999. His last Myoview in July of 2009 was nonischemic. He has good LV function. His been compliant with his meds. He has a 3.6 x 3.4 cm AAA which needs a followup duplex 9/14 He has not had t any abdominal pain. He is active. 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 He prefers to have his duplex at [facility]. He continues to see [name] [name] for his primary care needs.    Event monitor  2012 PAC;s and a few short bursts of SVT 4 beats. Low dose Toprol started  Patient felt no different on it and stopped it. No palpitations.  Indicates intolerance to beta blockers toprol and bystolic .  Interestingly diarrhea occurred with both   Has drug sensitivities to antibiotics also Hesitant to start Ranexa due to interaction with Bactrim which is one of the only antibiotics he can take  Rough 2016 . 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  Since that has been  Anxious Wife also sick for first time and needed pacemaker.  He is wondering about starting Lexapro  Also been on coreg, atenolol cardizem CD and ranexa that he did not tolerate  BP has been high Cozaar added last visit  .    ROS: Denies fever, malais, weight loss, blurry vision, decreased visual acuity, cough, sputum, SOB, hemoptysis, pleuritic pain, palpitaitons, heartburn, abdominal pain, melena, lower extremity edema, claudication, or rash.  All other systems reviewed and negative  General: Affect appropriate Healthy:  appears stated age 75: normal Neck supple with no adenopathy JVP normal no bruits no thyromegaly Lungs Mild exp wheezing and good diaphragmatic motion Heart:  S1/S2 no  murmur, no rub, gallop or click PMI normal Abdomen: benighn, BS positve, no tenderness, no AAA no bruit.  No HSM or HJR Distal pulses intact with no bruits No edema Neuro non-focal Skin warm and dry No muscular weakness   Current Outpatient Prescriptions  Medication Sig Dispense Refill  . albuterol (PROVENTIL) (2.5 MG/3ML) 0.083% nebulizer solution Take 2.5 mg by nebulization every 6 (six) hours as needed for wheezing or shortness of breath. Reported on 07/15/2015    . aspirin EC 81 MG tablet Take 81 mg by mouth daily.    . cetirizine (ZYRTEC) 10 MG tablet Take 10 mg by mouth daily.    . Diphenhyd-Hydrocort-Nystatin (FIRST-DUKES MOUTHWASH) SUSP Gargle and spit 1 table spoon four times a day. 1 Bottle 0  . esomeprazole (NEXIUM) 20 MG capsule Take 20 mg by mouth daily at 12 noon.    . ferrous sulfate 325 (65 FE) MG tablet Take 325 mg by mouth daily.    Marland Kitchen FLOVENT HFA 220 MCG/ACT inhaler INHALE 2 PUFFS INTO LUNGS 2 TIMES DAILY. (Patient taking differently: INHALE 2 PUFFS INTO LUNGS 2 TIMES DAILY AS NEEDED FOR SHORTNESS OF BREATH) 12 g 5  . fluticasone (FLONASE) 50 MCG/ACT nasal spray Place 1 spray into both nostrils daily.     Marland Kitchen losartan (COZAAR) 25 MG tablet Take 1 tablet (25 mg total) by mouth daily. 90 tablet 3  . lovastatin (MEVACOR) 40 MG tablet Take 2 tablets (80 mg  total) by mouth daily. (Patient taking differently: Take 40 mg by mouth daily. ) 60 tablet 5  . montelukast (SINGULAIR) 10 MG tablet TAKE ONE TABLET BY MOUTH AT BEDTIME AS NEEDED FOR ALLERGIES. (Patient taking differently: TAKE ONE TABLET BY MOUTH DAILY AS NEEDED FOR ALLERGIES.) 30 tablet 5  . Multiple Vitamin (MULTIVITAMIN WITH MINERALS) TABS tablet Take 1 tablet by mouth daily.    . nitroGLYCERIN (NITROSTAT) 0.4 MG SL tablet Place 1 tablet (0.4 mg total) under the tongue every 5 (five) minutes as needed. 90 tablet 3  . nystatin (MYCOSTATIN) 100000 UNIT/ML suspension GARGLE AND SPIT WITH 15ML BY MOUTH FOUR TIMES DAILY. 240 mL  0  . Probiotic Product (PROBIOTIC ADVANCED PO) Take 1 tablet by mouth daily.    Marland Kitchen RA GLUCOSAMINE-CHONDROITIN PO Take 1 tablet by mouth daily.     No current facility-administered medications for this visit.    Allergies  Lasix; Cefzil; Dexamethasone; Gabapentin; Neomycin; Tetracyclines & related; Methocarbamol; and Penicillins  Electrocardiogram:  07/17/12  SR rate 80  Normal 07/04/14   SR rate 77  PAC  Nonspecific ST changes  10/06/15  SR ate 90 PAC nonspecific ST changes   Assessment and Plan CAD:  Distant PCI RCA 1999 with normal myovue 2009  conitnue medical Rx intolerant to beta blocker  AAA:  3.9 cm 03/2015 fu duplex later this year at [facility]  HTN:  Improved on ARB continue  GERD:  Continue carafate and nexium f/u GI Asthm:  On inhalers no active wheezing  PAC/SVT:  Stable unable to take beta blockers Anxiety/Depression:  Better chose not to start SSRI  Wife's health is better which helps   F/U with me 6 months    [name] [name]

## 2015-10-30 ENCOUNTER — Encounter: Payer: Self-pay | Admitting: Cardiovascular Disease

## 2015-10-30 ENCOUNTER — Ambulatory Visit (INDEPENDENT_AMBULATORY_CARE_PROVIDER_SITE_OTHER): Payer: Medicare Other | Admitting: Cardiovascular Disease

## 2015-10-30 VITALS — BP 140/85 | HR 85 | Ht 73.0 in | Wt 216.0 lb

## 2015-10-30 DIAGNOSIS — I2510 Atherosclerotic heart disease of native coronary artery without angina pectoris: Secondary | ICD-10-CM

## 2015-10-30 DIAGNOSIS — I714 Abdominal aortic aneurysm, without rupture, unspecified: Secondary | ICD-10-CM

## 2015-10-30 DIAGNOSIS — I2583 Coronary atherosclerosis due to lipid rich plaque: Principal | ICD-10-CM

## 2015-10-30 NOTE — Patient Instructions (Addendum)
Your physician wants you to follow-up in: 6 months with Dr. Johnsie Cancel. You will receive a reminder letter in the mail two months in advance. If you don't receive a letter, please call our office to schedule the follow-up appointment.  Your physician recommends that you continue on your current medications as directed. Please refer to the Current Medication list given to you today.  Your physician has requested that you have an abdominal aorta duplex. During this test, an ultrasound is used to evaluate the aorta. Allow 30 minutes for this exam. Do not eat after midnight the day before and avoid carbonated beverages  If you need a refill on your cardiac medications before your next appointment, please call your pharmacy.  Thank you for choosing [facility]!

## 2015-11-10 ENCOUNTER — Ambulatory Visit (INDEPENDENT_AMBULATORY_CARE_PROVIDER_SITE_OTHER): Payer: Medicare Other | Admitting: Otolaryngology

## 2015-11-10 DIAGNOSIS — R0982 Postnasal drip: Secondary | ICD-10-CM

## 2015-11-10 DIAGNOSIS — R040 Epistaxis: Secondary | ICD-10-CM

## 2015-11-17 ENCOUNTER — Ambulatory Visit (INDEPENDENT_AMBULATORY_CARE_PROVIDER_SITE_OTHER): Payer: Medicare Other | Admitting: Otolaryngology

## 2015-11-17 DIAGNOSIS — R1312 Dysphagia, oropharyngeal phase: Secondary | ICD-10-CM | POA: Diagnosis not present

## 2015-11-17 DIAGNOSIS — R040 Epistaxis: Secondary | ICD-10-CM | POA: Diagnosis not present

## 2015-11-17 DIAGNOSIS — J310 Chronic rhinitis: Secondary | ICD-10-CM | POA: Diagnosis not present

## 2015-11-17 DIAGNOSIS — R070 Pain in throat: Secondary | ICD-10-CM | POA: Diagnosis not present

## 2015-11-20 DIAGNOSIS — K219 Gastro-esophageal reflux disease without esophagitis: Secondary | ICD-10-CM | POA: Diagnosis not present

## 2015-12-08 ENCOUNTER — Other Ambulatory Visit: Payer: Self-pay | Admitting: Family Medicine

## 2015-12-18 ENCOUNTER — Ambulatory Visit (INDEPENDENT_AMBULATORY_CARE_PROVIDER_SITE_OTHER): Payer: Medicare Other | Admitting: Otolaryngology

## 2015-12-18 DIAGNOSIS — J310 Chronic rhinitis: Secondary | ICD-10-CM

## 2015-12-18 DIAGNOSIS — R040 Epistaxis: Secondary | ICD-10-CM | POA: Diagnosis not present

## 2015-12-18 DIAGNOSIS — H6123 Impacted cerumen, bilateral: Secondary | ICD-10-CM | POA: Diagnosis not present

## 2015-12-30 ENCOUNTER — Ambulatory Visit (INDEPENDENT_AMBULATORY_CARE_PROVIDER_SITE_OTHER): Payer: Medicare Other | Admitting: Orthopaedic Surgery

## 2015-12-30 ENCOUNTER — Encounter: Payer: Self-pay | Admitting: Orthopaedic Surgery

## 2015-12-30 ENCOUNTER — Ambulatory Visit (INDEPENDENT_AMBULATORY_CARE_PROVIDER_SITE_OTHER): Payer: Medicare Other

## 2015-12-30 VITALS — BP 129/82 | HR 85 | Temp 97.9°F | Ht 72.0 in | Wt 217.0 lb

## 2015-12-30 DIAGNOSIS — M25522 Pain in left elbow: Secondary | ICD-10-CM

## 2015-12-30 DIAGNOSIS — R040 Epistaxis: Secondary | ICD-10-CM | POA: Diagnosis not present

## 2015-12-30 DIAGNOSIS — I2510 Atherosclerotic heart disease of native coronary artery without angina pectoris: Secondary | ICD-10-CM | POA: Diagnosis not present

## 2015-12-30 MED ORDER — PREDNISONE 5 MG (21) PO TBPK: ORAL_TABLET | ORAL | 0 refills | Status: DC

## 2015-12-30 NOTE — Progress Notes (Signed)
Subjective: My left elbow hurts    Patient ID: [name] [name], male    DOB: May 28, 1940, 75 y.o.   MRN: [id number]  HPI He has had about a two week history of pain of the left medial elbow area.  He is working on a house in [place] and may have hurt his arm there.  He has been doing lifting.  He has also history of left ulna nerve problems several years ago after back surgery.  He has no paresthesias now however.  He has no redness or swelling.  He has taken Advil which has helped and the pain is less.  He has used ice and heat which helped.  He has no loss of motion.   Review of Systems  HENT: Negative for congestion.   Respiratory: Negative for cough and shortness of breath.   Cardiovascular: Negative for chest pain and leg swelling.  Endocrine: Negative for cold intolerance.  Musculoskeletal: Positive for arthralgias and myalgias.  Allergic/Immunologic: Negative for environmental allergies.   Past Medical History:  Diagnosis Date  . AAA (abdominal aortic aneurysm) ([place])    needs yearly ultrasound  . Allergy   . Anemia   . Arthritis   . Asthma   . BPH (benign prostatic hyperplasia)   . CAD (coronary artery disease)   . Cancer ([place])    skin cancer  . COPD (chronic obstructive pulmonary disease) ([place])   . Dysrhythmia    pt. states it can be fast at times  . GERD (gastroesophageal reflux disease)   . Glaucoma   . HOH (hard of hearing)   . Hypercholesterolemia   . Hypertension   . Impaired fasting glucose   . Low back pain   . MI (myocardial infarction) ([place]) 1999  . Thrush     Past Surgical History:  Procedure Laterality Date  . BACK SURGERY     x 3  . CARDIAC CATHETERIZATION     angioplasty  . CATARACT EXTRACTION W/PHACO  03/20/2012   Procedure: CATARACT EXTRACTION PHACO AND INTRAOCULAR LENS PLACEMENT (IOC);  Surgeon: Williams Che, MD;  Location: [facility];  Service: Ophthalmology;  Laterality: Right;  CDE:  8.45  . CATARACT EXTRACTION W/PHACO Left 04/02/2013    Procedure: CATARACT EXTRACTION PHACO AND INTRAOCULAR LENS PLACEMENT (IOC);  Surgeon: Williams Che, MD;  Location: [facility];  Service: Ophthalmology;  Laterality: Left;  CDE:  6.50  . CHOLECYSTECTOMY  2000  . COLONOSCOPY  2009   repeat 5 years  . ESOPHAGOGASTRODUODENOSCOPY    . HERNIA REPAIR Left    inguinal  . LAPAROSCOPIC PARTIAL COLECTOMY N/A 06/11/2013   Procedure: LAPAROSCOPIC HAND ASSISTED PARTIAL COLECTOMY;  Surgeon: Jamesetta So, MD;  Location: [facility];  Service: General;  Laterality: N/A;  . right eye detached retina Bilateral   . YAG LASER APPLICATION Left [id number]   Procedure: YAG LASER APPLICATION;  Surgeon: Williams Che, MD;  Location: [facility];  Service: Ophthalmology;  Laterality: Left;    Current Outpatient Prescriptions on File Prior to Visit  Medication Sig Dispense Refill  . albuterol (PROVENTIL) (2.5 MG/3ML) 0.083% nebulizer solution INHALE 1 VIAL VIA NEBULIZER FOUR TIMES DAILY. 375 mL 0  . aspirin EC 81 MG tablet Take 81 mg by mouth daily.    . cetirizine (ZYRTEC) 10 MG tablet Take 10 mg by mouth daily.    Marland Kitchen esomeprazole (NEXIUM) 20 MG capsule Take 20 mg by mouth daily at 12 noon.    . fluticasone (FLONASE) 50 MCG/ACT nasal  spray Place 1 spray into both nostrils daily.     Marland Kitchen losartan (COZAAR) 25 MG tablet Take 1 tablet (25 mg total) by mouth daily. 90 tablet 3  . lovastatin (MEVACOR) 40 MG tablet Take 2 tablets (80 mg total) by mouth daily. (Patient taking differently: Take 40 mg by mouth daily. ) 60 tablet 5  . montelukast (SINGULAIR) 10 MG tablet TAKE ONE TABLET BY MOUTH AT BEDTIME AS NEEDED FOR ALLERGIES. (Patient taking differently: TAKE ONE TABLET BY MOUTH DAILY AS NEEDED FOR ALLERGIES.) 30 tablet 5  . Multiple Vitamin (MULTIVITAMIN WITH MINERALS) TABS tablet Take 1 tablet by mouth daily.    . nitroGLYCERIN (NITROSTAT) 0.4 MG SL tablet Place 1 tablet (0.4 mg total) under the tongue every 5 (five) minutes as needed. 90 tablet 3  . nystatin (MYCOSTATIN)  100000 UNIT/ML suspension GARGLE AND SPIT WITH 15ML BY MOUTH FOUR TIMES DAILY. 240 mL 0  . Probiotic Product (PROBIOTIC ADVANCED PO) Take 1 tablet by mouth daily.    Marland Kitchen RA GLUCOSAMINE-CHONDROITIN PO Take 1 tablet by mouth daily.     No current facility-administered medications on file prior to visit.     Social History   Social History  . Marital status: Married    Spouse name: N/A  . Number of children: N/A  . Years of education: N/A   Occupational History  . Not on file.   Social History Main Topics  . Smoking status: Former Smoker    Packs/day: 1.00    Years: 35.00    Types: Cigarettes    Quit date: 03/28/2003  . Smokeless tobacco: Never Used  . Alcohol use No  . Drug use: No  . Sexual activity: Yes    Birth control/ protection: None   Other Topics Concern  . Not on file   Social History Narrative  . No narrative on file    Family History  Problem Relation Age of Onset  . Hypertension Mother   . COPD Father   . Cancer Brother     brain    BP 129/82   Pulse 85   Temp 97.9 F (36.6 C)   Ht 6' (1.829 m)   Wt 217 lb (98.4 kg)   BMI 29.43 kg/m      Objective:   Physical Exam  Constitutional: He is oriented to person, place, and time. He appears well-developed and well-nourished.  HENT:  Head: Normocephalic and atraumatic.  Eyes: Conjunctivae and EOM are normal. Pupils are equal, round, and reactive to light.  Neck: Normal range of motion. Neck supple.  Cardiovascular: Normal rate, regular rhythm and intact distal pulses.   Pulmonary/Chest: Effort normal.  Abdominal: Soft.  Musculoskeletal: He exhibits tenderness (He has slight tenderness just proximal to the medial epijcondyle  on the left.  ROM is full, NV intact, ulnar nerve fuction normal.  shoulder full ROM, right side negative., grips normal.).  Neurological: He is alert and oriented to person, place, and time. He has normal reflexes. He displays normal reflexes. No cranial nerve deficit. He  exhibits normal muscle tone. Coordination normal.  Skin: Skin is warm and dry.  Psychiatric: He has a normal mood and affect. His behavior is normal. Judgment and thought content normal.     X-rays were done and reported separately.     Assessment & Plan:   Encounter Diagnosis  Name Primary?  . Left elbow pain Yes   I will begin prednisone dose pack.  Stop the Advil while on it.  Avoid heavy  lifting.  Return in two weeks.  Call if any problem.  Precautions discussed.  Electronically Signed Sanjuana Kava, MD 8/15/201710:01 AM

## 2015-12-31 ENCOUNTER — Other Ambulatory Visit (INDEPENDENT_AMBULATORY_CARE_PROVIDER_SITE_OTHER): Payer: Self-pay | Admitting: Otolaryngology

## 2015-12-31 DIAGNOSIS — J329 Chronic sinusitis, unspecified: Secondary | ICD-10-CM

## 2016-01-08 ENCOUNTER — Ambulatory Visit (HOSPITAL_COMMUNITY): Admission: RE | Admit: 2016-01-08 | Payer: Medicare Other | Source: Ambulatory Visit

## 2016-01-12 ENCOUNTER — Ambulatory Visit (HOSPITAL_COMMUNITY)
Admission: RE | Admit: 2016-01-12 | Discharge: 2016-01-12 | Disposition: A | Discharge status: Home / Self Care | Payer: Medicare Other | Source: Ambulatory Visit | Attending: Otolaryngology | Admitting: Otolaryngology

## 2016-01-12 DIAGNOSIS — J0101 Acute recurrent maxillary sinusitis: Secondary | ICD-10-CM | POA: Diagnosis not present

## 2016-01-12 DIAGNOSIS — J329 Chronic sinusitis, unspecified: Secondary | ICD-10-CM

## 2016-01-12 DIAGNOSIS — R040 Epistaxis: Secondary | ICD-10-CM | POA: Insufficient documentation

## 2016-01-13 ENCOUNTER — Encounter: Payer: Self-pay | Admitting: Orthopaedic Surgery

## 2016-01-13 ENCOUNTER — Ambulatory Visit (INDEPENDENT_AMBULATORY_CARE_PROVIDER_SITE_OTHER): Payer: Medicare Other | Admitting: Orthopaedic Surgery

## 2016-01-13 VITALS — BP 116/77 | HR 88 | Temp 98.1°F | Ht 72.0 in | Wt 215.0 lb

## 2016-01-13 DIAGNOSIS — M25522 Pain in left elbow: Secondary | ICD-10-CM | POA: Diagnosis not present

## 2016-01-13 DIAGNOSIS — I2510 Atherosclerotic heart disease of native coronary artery without angina pectoris: Secondary | ICD-10-CM

## 2016-01-13 NOTE — Progress Notes (Signed)
Patient [name] [name]:[id number] [name] [name], male DOB:Apr 17, 1941, 75 y.o. [id number]  Chief Complaint  Patient presents with  . Follow-up    left arm    HPI  [name] [name] is a 75 y.o. male who has medial left elbow pain. It improved with the prednisone but some of the pain has returned.  It is not as painful.  It is a nagging at time pain and at other times it is not present.  He has no numbness.  He has had problem with the left ulnar nerve in the past but not now.  He has no weakness.  He has used Advil and Aspercreme.  He has no new trauma.  I have gone over care of this and it will take time to resolve.  He will see me as needed.  HPI  Body mass index is 29.16 kg/m.  ROS  Review of Systems  HENT: Negative for congestion.   Respiratory: Negative for cough and shortness of breath.   Cardiovascular: Negative for chest pain and leg swelling.  Endocrine: Negative for cold intolerance.  Musculoskeletal: Positive for arthralgias and myalgias.  Allergic/Immunologic: Negative for environmental allergies.    Past Medical History:  Diagnosis Date  . AAA (abdominal aortic aneurysm) ([place])    needs yearly ultrasound  . Allergy   . Anemia   . Arthritis   . Asthma   . BPH (benign prostatic hyperplasia)   . CAD (coronary artery disease)   . Cancer ([place])    skin cancer  . COPD (chronic obstructive pulmonary disease) ([place])   . Dysrhythmia    pt. states it can be fast at times  . GERD (gastroesophageal reflux disease)   . Glaucoma   . HOH (hard of hearing)   . Hypercholesterolemia   . Hypertension   . Impaired fasting glucose   . Low back pain   . MI (myocardial infarction) ([place]) 1999  . Thrush     Past Surgical History:  Procedure Laterality Date  . BACK SURGERY     x 3  . CARDIAC CATHETERIZATION     angioplasty  . CATARACT EXTRACTION W/PHACO  03/20/2012   Procedure: CATARACT EXTRACTION PHACO AND INTRAOCULAR LENS PLACEMENT (IOC);  Surgeon: Williams Che, MD;  Location: [facility];   Service: Ophthalmology;  Laterality: Right;  CDE:  8.45  . CATARACT EXTRACTION W/PHACO Left 04/02/2013   Procedure: CATARACT EXTRACTION PHACO AND INTRAOCULAR LENS PLACEMENT (IOC);  Surgeon: Williams Che, MD;  Location: [facility];  Service: Ophthalmology;  Laterality: Left;  CDE:  6.50  . CHOLECYSTECTOMY  2000  . COLONOSCOPY  2009   repeat 5 years  . ESOPHAGOGASTRODUODENOSCOPY    . HERNIA REPAIR Left    inguinal  . LAPAROSCOPIC PARTIAL COLECTOMY N/A 06/11/2013   Procedure: LAPAROSCOPIC HAND ASSISTED PARTIAL COLECTOMY;  Surgeon: Jamesetta So, MD;  Location: [facility];  Service: General;  Laterality: N/A;  . right eye detached retina Bilateral   . YAG LASER APPLICATION Left [id number]   Procedure: YAG LASER APPLICATION;  Surgeon: Williams Che, MD;  Location: [facility];  Service: Ophthalmology;  Laterality: Left;    Family History  Problem Relation Age of Onset  . Hypertension Mother   . COPD Father   . Cancer Brother     brain    Social History Social History  Substance Use Topics  . Smoking status: Former Smoker    Packs/day: 1.00    Years: 35.00    Types: Cigarettes    Quit date:  03/28/2003  . Smokeless tobacco: Never Used  . Alcohol use No    Allergies  Allergen Reactions  . Lasix [Furosemide] Rash  . Cefzil [Cefprozil] Nausea Only  . Dexamethasone Swelling  . Gabapentin     Legs swell up   . Neomycin Other (See Comments)    Patient can't remember.  . Tetracyclines & Related Itching  . Methocarbamol Swelling and Rash  . Penicillins Rash    Current Outpatient Prescriptions  Medication Sig Dispense Refill  . albuterol (PROVENTIL) (2.5 MG/3ML) 0.083% nebulizer solution INHALE 1 VIAL VIA NEBULIZER FOUR TIMES DAILY. 375 mL 0  . aspirin EC 81 MG tablet Take 81 mg by mouth daily.    . cetirizine (ZYRTEC) 10 MG tablet Take 10 mg by mouth daily.    Marland Kitchen esomeprazole (NEXIUM) 20 MG capsule Take 20 mg by mouth daily at 12 noon.    . fluticasone (FLONASE) 50 MCG/ACT nasal  spray Place 1 spray into both nostrils daily.     Marland Kitchen losartan (COZAAR) 25 MG tablet Take 1 tablet (25 mg total) by mouth daily. 90 tablet 3  . lovastatin (MEVACOR) 40 MG tablet Take 2 tablets (80 mg total) by mouth daily. (Patient taking differently: Take 40 mg by mouth daily. ) 60 tablet 5  . montelukast (SINGULAIR) 10 MG tablet TAKE ONE TABLET BY MOUTH AT BEDTIME AS NEEDED FOR ALLERGIES. (Patient taking differently: TAKE ONE TABLET BY MOUTH DAILY AS NEEDED FOR ALLERGIES.) 30 tablet 5  . Multiple Vitamin (MULTIVITAMIN WITH MINERALS) TABS tablet Take 1 tablet by mouth daily.    . nitroGLYCERIN (NITROSTAT) 0.4 MG SL tablet Place 1 tablet (0.4 mg total) under the tongue every 5 (five) minutes as needed. 90 tablet 3  . nystatin (MYCOSTATIN) 100000 UNIT/ML suspension GARGLE AND SPIT WITH 15ML BY MOUTH FOUR TIMES DAILY. 240 mL 0  . predniSONE (STERAPRED UNI-PAK 21 TAB) 5 MG (21) TBPK tablet Take 6 pills first day; 5 pills second day; 4 pills third day; 3 pills fourth day; 2 pills next day and 1 pill last day. 21 tablet 0  . Probiotic Product (PROBIOTIC ADVANCED PO) Take 1 tablet by mouth daily.    Marland Kitchen RA GLUCOSAMINE-CHONDROITIN PO Take 1 tablet by mouth daily.     No current facility-administered medications for this visit.      Physical Exam  Blood pressure 116/77, pulse 88, temperature 98.1 F (36.7 C), height 6' (1.829 m), weight 215 lb (97.5 kg).  Constitutional: overall normal hygiene, normal nutrition, well developed, normal grooming, normal body habitus. Assistive device:none  Musculoskeletal: gait and station Limp none, muscle tone and strength are normal, no tremors or atrophy is present.  .  Neurological: coordination overall normal.  Deep tendon reflex/nerve stretch intact.  Sensation normal.  Cranial nerves II-XII intact.   Skin:   normal overall no scars, lesions, ulcers or rashes. No psoriasis.  Psychiatric: Alert and oriented x 3.  Recent memory intact, remote memory unclear.   Normal mood and affect. Well groomed.  Good eye contact.  Cardiovascular: overall no swelling, no varicosities, no edema bilaterally, normal temperatures of the legs and arms, no clubbing, cyanosis and good capillary refill.  Lymphatic: palpation is normal.  He has full ROM of the left elbow.  NV is intact.  He has slight tenderness of the medial epicondyle area but no real point tenderness, no redness, no swelling.  Grips are normal.  Right arm negative.  The patient has been educated about the nature of the problem(s) and  counseled on treatment options.  The patient appeared to understand what I have discussed and is in agreement with it.  Encounter Diagnosis  Name Primary?  . Left elbow pain Yes    PLAN Call if any problems.  Precautions discussed.  Continue current medications.   Return to clinic PRN   Electronically Signed Sanjuana Kava, MD 8/29/201711:37 AM

## 2016-01-20 ENCOUNTER — Telehealth: Payer: Self-pay | Admitting: Cardiovascular Disease

## 2016-01-20 MED ORDER — NITROGLYCERIN 0.4 MG SL SUBL: 0.4000 mg | SUBLINGUAL_TABLET | SUBLINGUAL | 1 refills | Status: DC | PRN

## 2016-01-20 NOTE — Telephone Encounter (Signed)
New Message   *STAT* If patient is at the pharmacy, call can be transferred to refill team.   1. Which medications need to be refilled? (please list name of each medication and dose if known) Nitroglycerin Tablets .04 mg sl tablet  2. Which pharmacy/location (including street and city if local pharmacy) is medication to be sent to? [place] [place]  3. Do they need a 30 day or 90 day supply? 90 day

## 2016-01-22 ENCOUNTER — Ambulatory Visit (INDEPENDENT_AMBULATORY_CARE_PROVIDER_SITE_OTHER): Payer: Medicare Other | Admitting: Otolaryngology

## 2016-01-22 DIAGNOSIS — J342 Deviated nasal septum: Secondary | ICD-10-CM

## 2016-01-22 DIAGNOSIS — J343 Hypertrophy of nasal turbinates: Secondary | ICD-10-CM

## 2016-01-22 DIAGNOSIS — R040 Epistaxis: Secondary | ICD-10-CM

## 2016-01-26 ENCOUNTER — Telehealth: Payer: Self-pay | Admitting: Orthopaedic Surgery

## 2016-01-26 NOTE — Telephone Encounter (Signed)
Prednisone 5 mg Qty 21 Tablets

## 2016-01-27 MED ORDER — PREDNISONE 5 MG (21) PO TBPK: ORAL_TABLET | ORAL | 0 refills | Status: DC

## 2016-02-10 ENCOUNTER — Other Ambulatory Visit: Payer: Self-pay | Admitting: Dermatology

## 2016-02-10 DIAGNOSIS — C44622 Squamous cell carcinoma of skin of right upper limb, including shoulder: Secondary | ICD-10-CM | POA: Diagnosis not present

## 2016-02-10 DIAGNOSIS — C44629 Squamous cell carcinoma of skin of left upper limb, including shoulder: Secondary | ICD-10-CM | POA: Diagnosis not present

## 2016-02-10 DIAGNOSIS — L570 Actinic keratosis: Secondary | ICD-10-CM | POA: Diagnosis not present

## 2016-02-17 ENCOUNTER — Other Ambulatory Visit: Payer: Self-pay | Admitting: Family Medicine

## 2016-02-18 ENCOUNTER — Other Ambulatory Visit: Payer: Self-pay | Admitting: Family Medicine

## 2016-03-05 ENCOUNTER — Ambulatory Visit (INDEPENDENT_AMBULATORY_CARE_PROVIDER_SITE_OTHER): Payer: Medicare Other | Admitting: Family Medicine

## 2016-03-05 VITALS — BP 128/68 | Temp 98.6°F | Ht 73.0 in | Wt 213.0 lb

## 2016-03-05 DIAGNOSIS — I2510 Atherosclerotic heart disease of native coronary artery without angina pectoris: Secondary | ICD-10-CM

## 2016-03-05 DIAGNOSIS — N410 Acute prostatitis: Secondary | ICD-10-CM

## 2016-03-05 DIAGNOSIS — J410 Simple chronic bronchitis: Secondary | ICD-10-CM

## 2016-03-05 DIAGNOSIS — I2583 Coronary atherosclerosis due to lipid rich plaque: Secondary | ICD-10-CM

## 2016-03-05 DIAGNOSIS — R300 Dysuria: Secondary | ICD-10-CM

## 2016-03-05 DIAGNOSIS — N400 Enlarged prostate without lower urinary tract symptoms: Secondary | ICD-10-CM | POA: Diagnosis not present

## 2016-03-05 LAB — POCT URINALYSIS DIPSTICK
Spec Grav, UA: <=1.005
Urobilinogen, UA: 2
pH, UA: 7

## 2016-03-05 MED ORDER — TAMSULOSIN HCL 0.4 MG PO CAPS: 0.4000 mg | ORAL_CAPSULE | Freq: Every day | ORAL | 5 refills | Status: DC

## 2016-03-05 MED ORDER — SULFAMETHOXAZOLE-TRIMETHOPRIM 800-160 MG PO TABS: 1.0000 | ORAL_TABLET | Freq: Two times a day (BID) | ORAL | 0 refills | Status: DC

## 2016-03-05 NOTE — Progress Notes (Signed)
   Subjective:    Patient ID: [name] [name], male    DOB: Aug 18, 1940, 75 y.o.   MRN: [id number]  Dysuria   This is a new problem. Episode onset: one week ago. Associated symptoms include frequency and urgency.   Pt had troubl urinating last yr post surgical   Had catherter from the emergency room  Average about twice per night to uriate  incr urge  frequ urination lately  Was experiencing groin pain off and on earlier today  Patient notes a bit more coughing last couple days his uses albuterol inhaler only once. Next  Patient gives a long-standing history of urinary tract outflow issues. Saw urologist last year. Actually had to have a catheter postsurgical. Took some medicine for while came off of it not sure why he did   Review of Systems  Genitourinary: Positive for dysuria, frequency and urgency.       Objective:   Physical Exam Alert vitals stable, NAD. Blood pressure good on repeat. HEENT normal. Lungs Rare wheeze with deep breath otherwise clear. Heart regular rate and rhythm. Prostate exam boggy and somewhat tender  Urinalysis normal       Assessment & Plan:  Impression 1 mild exacerbation of reactive airways discussed use Ventolin when necessary no steroids rationale discussed #2 chronic prostate hypertrophy discussed at length. Patient would like to try medication for this #3 acute prostatitis with numerous antibiotic sensitivities plan Bactrim DS twice a day 21 days. Initiate Flomax rationale discussed. Symptom care discussed albuterol when necessary 25 minutes spent most in discussion WSL

## 2016-03-06 DIAGNOSIS — N400 Enlarged prostate without lower urinary tract symptoms: Secondary | ICD-10-CM | POA: Insufficient documentation

## 2016-03-10 ENCOUNTER — Telehealth: Payer: Self-pay | Admitting: Family Medicine

## 2016-03-10 DIAGNOSIS — N419 Inflammatory disease of prostate, unspecified: Secondary | ICD-10-CM

## 2016-03-10 NOTE — Telephone Encounter (Signed)
Left message to return call 

## 2016-03-10 NOTE — Telephone Encounter (Signed)
No, I just aked if he had seen one in the past.i felt his current infxn we could take care of on our own. If he wants to see one we can refer him to one

## 2016-03-10 NOTE — Telephone Encounter (Signed)
Pt called stating that when he was in the office last week Dr. Richardson Landry mentioned recommending him to a urologist. Pt is wanting to know who Dr. Richardson Landry recommends him seeing.

## 2016-03-11 NOTE — Telephone Encounter (Signed)
Patient states he just feels like it is time to get established with urology for future issues. Referral ordered in EPIC.

## 2016-03-12 ENCOUNTER — Encounter: Payer: Self-pay | Admitting: Family Medicine

## 2016-03-15 DIAGNOSIS — Z23 Encounter for immunization: Secondary | ICD-10-CM | POA: Diagnosis not present

## 2016-03-17 ENCOUNTER — Ambulatory Visit (HOSPITAL_COMMUNITY)
Admission: RE | Admit: 2016-03-17 | Discharge: 2016-03-17 | Disposition: A | Discharge status: Home / Self Care | Payer: Medicare Other | Source: Ambulatory Visit | Attending: Cardiovascular Disease | Admitting: Cardiovascular Disease

## 2016-03-17 DIAGNOSIS — I714 Abdominal aortic aneurysm, without rupture, unspecified: Secondary | ICD-10-CM

## 2016-03-17 DIAGNOSIS — I2583 Coronary atherosclerosis due to lipid rich plaque: Secondary | ICD-10-CM

## 2016-03-17 DIAGNOSIS — I2510 Atherosclerotic heart disease of native coronary artery without angina pectoris: Secondary | ICD-10-CM | POA: Insufficient documentation

## 2016-03-18 ENCOUNTER — Ambulatory Visit (INDEPENDENT_AMBULATORY_CARE_PROVIDER_SITE_OTHER): Payer: Medicare Other | Admitting: Otolaryngology

## 2016-03-18 DIAGNOSIS — R040 Epistaxis: Secondary | ICD-10-CM | POA: Diagnosis not present

## 2016-03-28 IMAGING — RF DG LUMBAR SPINE 2-3V
1 series · 2 of 2 positions shown · non-contrast
Comparison: Lumbar spine MRI 09/06/2014

CLINICAL DATA: L5-S1 PLIF

EXAM:
DG C-ARM GT 120 MIN; LUMBAR SPINE - 2-3 VIEW

[Series 1: run · 2 of 2 slices shown]
[im 1/2]
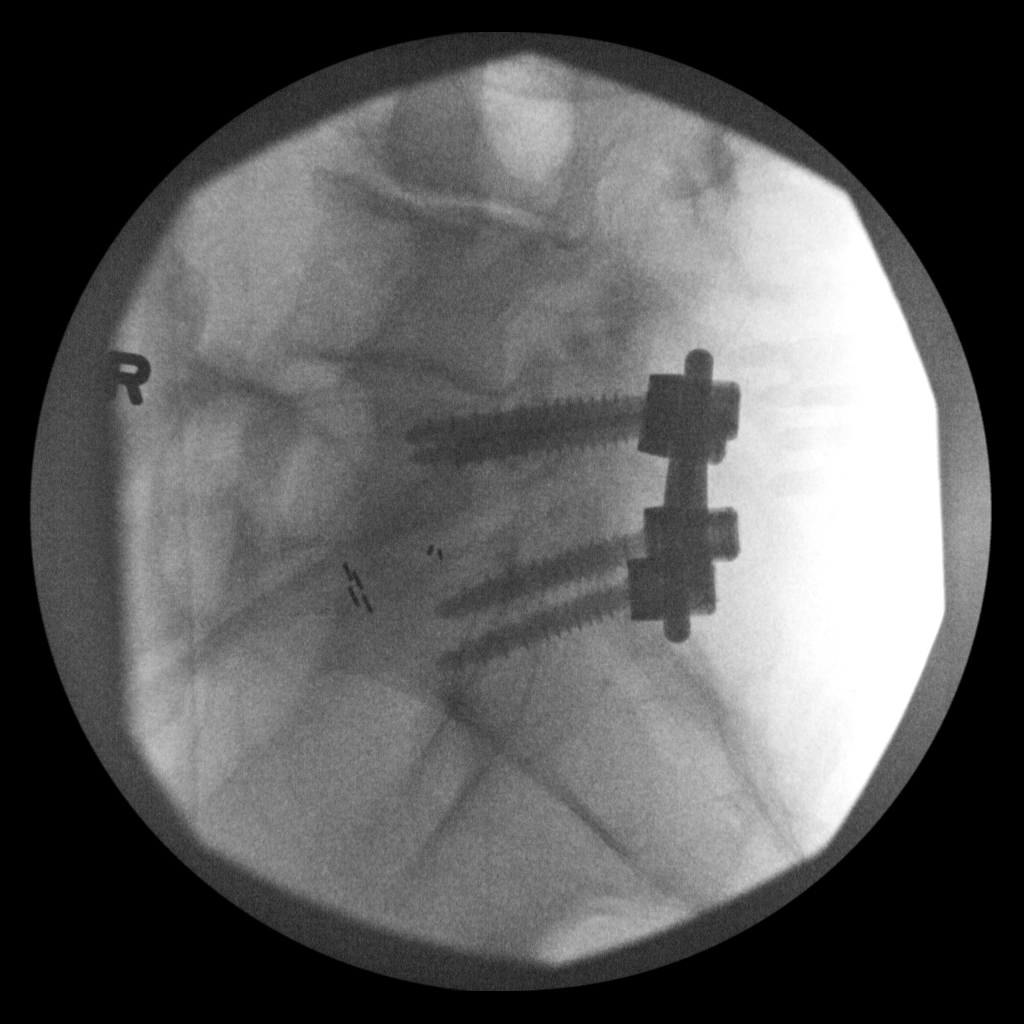
[im 2/2]
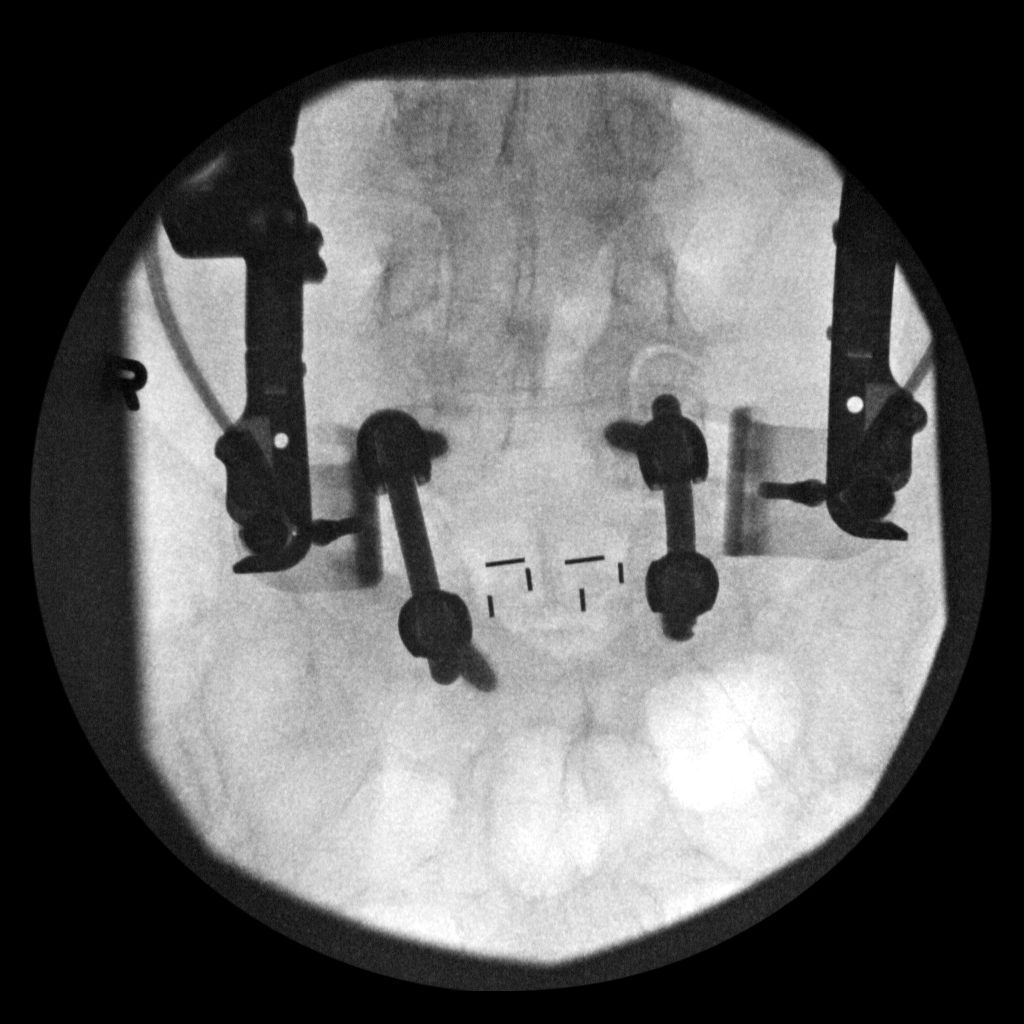

[2 of 2 positions shown; findings below may reference images not displayed]

FINDINGS: L5-S1 discectomy with posterior rod and screw fixation. The
intervertebral cage is normally positioned. No evidence of hardware
or osseous fracture.

L4-5 and L3-4 retrolisthesis, as seen on comparison study.
IMPRESSION: Fluoroscopy for L5-S1 discectomy and posterior fusion. No
complicating features.

## 2016-03-29 NOTE — Progress Notes (Signed)
Patient ID: [name] [name], male   DOB: 1940/07/22, 75 y.o.   MRN: [id number]   [name] [name] is seen today in followup for his coronary artery disease. 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 Olevia Perches I believe back in 1999. His last Myoview in July of 2009 was nonischemic. He has good LV function. His been compliant with his meds. He has a 4.0 cm AAA  He has not had t any abdominal pain. He is active. 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 He prefers to have his duplex at [facility]. He continues to see [name] [name] for his primary care needs.    Event monitor  2012 PAC;s and a few short bursts of SVT 4 beats. Low dose Toprol started  Patient felt no different on it and stopped it. No palpitations.  Indicates intolerance to beta blockers toprol and bystolic .  Interestingly diarrhea occurred with both   Has drug sensitivities to antibiotics also Hesitant to start Ranexa due to interaction with Bactrim which is one of the only antibiotics he can take  Rough 2016 . 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  Since that has been  Anxious Wife also sick for first time and needed pacemaker.  He is wondering about starting Lexapro  Also been on coreg, atenolol cardizem CD and ranexa that he did not tolerate  BP has been high Cozaar added last visit  Thinks cozaar may be making it worse  Has seen [name] [name] for ENT and had at least one nose bleed cauterized No sinus infection   Studies reviewed: [place] 03/17/16 for AAA IMPRESSION: Abdominal aortic aneurysm again noted with maximum transverse diameter 4.0 x 3.7 cm. Recommend followup by ultrasound in 1 year. This recommendation follows ACR consensus guidelines: White Paper of the ACR Incidental Findings Committee II on Vascular Findings. J Am Coll Radiol 2013; 10:789-794.  .    ROS: Denies fever, malais, weight loss, blurry  vision, decreased visual acuity, cough, sputum, SOB, hemoptysis, pleuritic pain, palpitaitons, heartburn, abdominal pain, melena, lower extremity edema, claudication, or rash.  All other systems reviewed and negative  General: Affect appropriate Healthy:  appears stated age 22: normal Neck supple with no adenopathy JVP normal no bruits no thyromegaly Lungs Mild exp wheezing and good diaphragmatic motion Heart:  S1/S2 no murmur, no rub, gallop or click PMI normal Abdomen: benighn, BS positve, no tenderness, no AAA no bruit.  No HSM or HJR Distal pulses intact with no bruits No edema Neuro non-focal Skin warm and dry No muscular weakness   Current Outpatient Prescriptions  Medication Sig Dispense Refill  . albuterol (PROVENTIL) (2.5 MG/3ML) 0.083% nebulizer solution INHALE 1 VIAL VIA NEBULIZER FOUR TIMES DAILY. 375 mL 0  . aspirin EC 81 MG tablet Take 81 mg by mouth daily.    . cetirizine (ZYRTEC) 10 MG tablet Take 10 mg by mouth daily.    Marland Kitchen esomeprazole (NEXIUM) 20 MG capsule Take 20 mg by mouth daily at 12 noon.    Marland Kitchen losartan (COZAAR) 25 MG tablet Take 1 tablet (25 mg total) by mouth daily. 90 tablet 3  . lovastatin (MEVACOR) 40 MG tablet TAKE 2 TABLETS BY MOUTH DAILY. 60 tablet 0  . montelukast (SINGULAIR) 10 MG tablet TAKE ONE TABLET BY MOUTH AT BEDTIME AS NEEDED FOR ALLERGIES. (Patient taking differently: TAKE ONE TABLET BY  MOUTH DAILY AS NEEDED FOR ALLERGIES.) 30 tablet 5  . Multiple Vitamin (MULTIVITAMIN WITH MINERALS) TABS tablet Take 1 tablet by mouth daily.    . nitroGLYCERIN (NITROSTAT) 0.4 MG SL tablet Place 1 tablet (0.4 mg total) under the tongue every 5 (five) minutes as needed. 25 tablet 1  . Probiotic Product (PROBIOTIC ADVANCED PO) Take 1 tablet by mouth daily.     No current facility-administered medications for this visit.     Allergies  Lasix [furosemide]; Cefzil [cefprozil]; Dexamethasone; Gabapentin; Neomycin; Tetracyclines & related; Methocarbamol; and  Penicillins  Electrocardiogram:  07/17/12  SR rate 80  Normal 07/04/14   SR rate 77  PAC  Nonspecific ST changes  10/06/15  SR ate 90 PAC nonspecific ST changes   Assessment and Plan CAD:  Distant PCI RCA 1999 with normal myovue 2009  conitnue medical Rx intolerant to beta blocker  AAA:  Stable by most recent US 03/17/16 4.0 cm  HTN:  Improved but have to hold ARB for now  GERD:  Continue carafate and nexium f/u GI Asthm:  On inhalers no active wheezing  PAC/SVT:  Stable unable to take beta blockers Anxiety/Depression:  Better chose not to start SSRI  Wife's health is better which helps  ENT:  Hold ARB for 8 weeks and see if nasal congestion and propensity for nose bleeds improves He will call if BP goes too high   F/U with me 6 months    [name] [name]

## 2016-03-30 ENCOUNTER — Encounter: Payer: Self-pay | Admitting: Cardiovascular Disease

## 2016-03-30 ENCOUNTER — Ambulatory Visit (INDEPENDENT_AMBULATORY_CARE_PROVIDER_SITE_OTHER): Payer: Medicare Other | Admitting: Cardiovascular Disease

## 2016-03-30 VITALS — BP 128/68 | HR 98 | Ht 73.0 in | Wt 211.0 lb

## 2016-03-30 DIAGNOSIS — I2510 Atherosclerotic heart disease of native coronary artery without angina pectoris: Secondary | ICD-10-CM | POA: Diagnosis not present

## 2016-03-30 DIAGNOSIS — I2583 Coronary atherosclerosis due to lipid rich plaque: Secondary | ICD-10-CM | POA: Diagnosis not present

## 2016-03-30 NOTE — Patient Instructions (Signed)
Medication Instructions:  HOLD LOSARTAN FOR 8 WEEKS TO SEE IF CONGESTION STOPS  Labwork: NONE  Testing/Procedures: NONE  Follow-Up: Your physician wants you to follow-up in: 6 MONTHS  You will receive a reminder letter in the mail two months in advance. If you don't receive a letter, please call our office to schedule the follow-up appointment.   Any Other Special Instructions Will Be Listed Below (If Applicable).     If you need a refill on your cardiac medications before your next appointment, please call your pharmacy.

## 2016-03-31 ENCOUNTER — Encounter: Payer: Self-pay | Admitting: Nurse Practitioner

## 2016-03-31 ENCOUNTER — Ambulatory Visit (INDEPENDENT_AMBULATORY_CARE_PROVIDER_SITE_OTHER): Payer: Medicare Other | Admitting: Nurse Practitioner

## 2016-03-31 VITALS — BP 122/76 | Temp 98.5°F | Ht 73.0 in | Wt 213.0 lb

## 2016-03-31 DIAGNOSIS — I2510 Atherosclerotic heart disease of native coronary artery without angina pectoris: Secondary | ICD-10-CM

## 2016-03-31 DIAGNOSIS — B029 Zoster without complications: Secondary | ICD-10-CM

## 2016-03-31 DIAGNOSIS — B370 Candidal stomatitis: Secondary | ICD-10-CM

## 2016-03-31 DIAGNOSIS — I2583 Coronary atherosclerosis due to lipid rich plaque: Secondary | ICD-10-CM

## 2016-03-31 MED ORDER — VALACYCLOVIR HCL 1 G PO TABS: 1000.0000 mg | ORAL_TABLET | Freq: Three times a day (TID) | ORAL | 0 refills | Status: DC

## 2016-03-31 MED ORDER — NYSTATIN 100000 UNIT/ML MT SUSP: 5.0000 mL | Freq: Four times a day (QID) | OROMUCOSAL | 0 refills | Status: DC

## 2016-03-31 NOTE — Patient Instructions (Signed)
Shingles Shingles, which is also known as herpes zoster, is an infection that causes a painful skin rash and fluid-filled blisters. Shingles is not related to genital herpes, which is a sexually transmitted infection. Shingles only develops in people who:  Have had chickenpox.  Have received the chickenpox vaccine. (This is rare.)  What are the causes? Shingles is caused by varicella-zoster virus (VZV). This is the same virus that causes chickenpox. After exposure to VZV, the virus stays in the body in an inactive (dormant) state. Shingles develops if the virus reactivates. This can happen many years after the initial exposure to VZV. It is not known what causes this virus to reactivate. What increases the risk? People who have had chickenpox or received the chickenpox vaccine are at risk for shingles. Infection is more common in people who:  Are older than age 50.  Have a weakened defense (immune) system, such as those with HIV, AIDS, or cancer.  Are taking medicines that weaken the immune system, such as transplant medicines.  Are under great stress.  What are the signs or symptoms? Early symptoms of this condition include itching, tingling, and pain in an area on your skin. Pain may be described as burning, stabbing, or throbbing. A few days or weeks after symptoms start, a painful red rash appears, usually on one side of the body in a bandlike or beltlike pattern. The rash eventually turns into fluid-filled blisters that break open, scab over, and dry up in about 2-3 weeks. At any time during the infection, you may also develop:  A fever.  Chills.  A headache.  An upset stomach.  How is this diagnosed? This condition is diagnosed with a skin exam. Sometimes, skin or fluid samples are taken from the blisters before a diagnosis is made. These samples are examined under a microscope or sent to a lab for testing. How is this treated? There is no specific cure for this condition.  Your health care provider will probably prescribe medicines to help you manage pain, recover more quickly, and avoid long-term problems. Medicines may include:  Antiviral drugs.  Anti-inflammatory drugs.  Pain medicines.  If the area involved is on your face, you may be referred to a specialist, such as an eye doctor (ophthalmologist) or an ear, nose, and throat (ENT) doctor to help you avoid eye problems, chronic pain, or disability. Follow these instructions at home: Medicines  Take medicines only as directed by your health care provider.  Apply an anti-itch or numbing cream to the affected area as directed by your health care provider. Blister and Rash Care  Take a cool bath or apply cool compresses to the area of the rash or blisters as directed by your health care provider. This may help with pain and itching.  Keep your rash covered with a loose bandage (dressing). Wear loose-fitting clothing to help ease the pain of material rubbing against the rash.  Keep your rash and blisters clean with mild soap and cool water or as directed by your health care provider.  Check your rash every day for signs of infection. These include redness, swelling, and pain that lasts or increases.  Do not pick your blisters.  Do not scratch your rash. General instructions  Rest as directed by your health care provider.  Keep all follow-up visits as directed by your health care provider. This is important.  Until your blisters scab over, your infection can cause chickenpox in people who have never had it or been vaccinated   against it. To prevent this from happening, avoid contact with other people, especially: ? Babies. ? Pregnant women. ? Children who have eczema. ? Elderly people who have transplants. ? People who have chronic illnesses, such as leukemia or AIDS. Contact a health care provider if:  Your pain is not relieved with prescribed medicines.  Your pain does not get better after  the rash heals.  Your rash looks infected. Signs of infection include redness, swelling, and pain that lasts or increases. Get help right away if:  The rash is on your face or nose.  You have facial pain, pain around your eye area, or loss of feeling on one side of your face.  You have ear pain or you have ringing in your ear.  You have loss of taste.  Your condition gets worse. This information is not intended to replace advice given to you by your health care provider. Make sure you discuss any questions you have with your health care provider. Document Released: 05/03/2005 Document Revised: 12/28/2015 Document Reviewed: 03/14/2014 Elsevier Interactive Patient Education  2017 Elsevier Inc.  

## 2016-03-31 NOTE — Progress Notes (Signed)
   Subjective:    Patient ID: [name] [name], male    DOB: 1940-12-28, 75 y.o.   MRN: [id number]  HPI presents for complaints of rash on the left side of the neck for less than 48 hours. Began with some tenderness and irritation in the area. Slight chills. Mild fatigue. No documented fever. Has not had the shingles vaccine. No headache. No eye involvement. Minimally pruritic. Has also had a recurrence of his thrush in his mouth, has been treated for this in the past. Had resolved after treatment last time.    Review of Systems     Objective:   Physical Exam NAD. Alert, oriented. TMs minimal clear effusion, no erythema. Pharynx clear. Neck supple with moderate soft adenopathy. Slightly raised pink discrete lesions going up the left side of the neck to near the ear. A couple of lesions on the left forehead. No obvious eye involvement. A few lesions behind the left ear have a slight vesicular appearance. Lungs clear. Heart regular rate rhythm.      Assessment & Plan:   Problem List Items Addressed This Visit      Digestive   Oral candidiasis   Relevant Medications   valACYclovir (VALTREX) 1000 MG tablet   nystatin (MYCOSTATIN) 100000 UNIT/ML suspension     Other   Herpes zoster without complication - Primary   Relevant Medications   valACYclovir (VALTREX) 1000 MG tablet   nystatin (MYCOSTATIN) 100000 UNIT/ML suspension     Meds ordered this encounter  Medications  . valACYclovir (VALTREX) 1000 MG tablet    Sig: Take 1 tablet (1,000 mg total) by mouth 3 (three) times daily.    Dispense:  21 tablet    Refill:  0    Order Specific Question:   Supervising Provider    Answer:   Mikey Kirschner [[id number]]  . nystatin (MYCOSTATIN) 100000 UNIT/ML suspension    Sig: Take 5 mLs (500,000 Units total) by mouth 4 (four) times daily. For thrush    Dispense:  240 mL    Refill:  0    Order Specific Question:   Supervising Provider    Answer:   Mikey Kirschner [[id number]]   Start Valtrex as soon  as possible. Given written and verbal information on shingles. Call back in 7-10 days if no significant improvement. Patient understands nerve involvement can persist. Start nystatin oral suspension as directed. Call back if oral candidiasis persist. Recommend new shingles vaccine in a few months once it is available.

## 2016-04-01 ENCOUNTER — Encounter: Payer: Self-pay | Admitting: Family Medicine

## 2016-04-01 ENCOUNTER — Encounter: Payer: Self-pay | Admitting: Nurse Practitioner

## 2016-04-01 ENCOUNTER — Telehealth: Payer: Self-pay | Admitting: Nurse Practitioner

## 2016-04-01 DIAGNOSIS — B029 Zoster without complications: Secondary | ICD-10-CM | POA: Insufficient documentation

## 2016-04-01 DIAGNOSIS — B370 Candidal stomatitis: Secondary | ICD-10-CM | POA: Insufficient documentation

## 2016-04-01 NOTE — Telephone Encounter (Signed)
Spoke with patient and patient stated that the whelps have started on his jaw and he was calling to inform us because he was told if the shingles develop on his face near his eyes we may need to refer him to an opthalmology. Please advise?

## 2016-04-01 NOTE — Telephone Encounter (Signed)
Pt called stating that the welps are now on his jawline. Pt is wanting carolyn to call him back.

## 2016-04-01 NOTE — Telephone Encounter (Signed)
Actually to clarify; if rash gets near his eyes or on the end of his nose, we need to know. If he has any questions before weekend, recommend recheck tomorrow.

## 2016-04-02 ENCOUNTER — Ambulatory Visit (INDEPENDENT_AMBULATORY_CARE_PROVIDER_SITE_OTHER): Payer: Medicare Other | Admitting: Family Medicine

## 2016-04-02 ENCOUNTER — Encounter: Payer: Self-pay | Admitting: Family Medicine

## 2016-04-02 VITALS — BP 114/74 | Ht 73.0 in | Wt 214.6 lb

## 2016-04-02 DIAGNOSIS — I2583 Coronary atherosclerosis due to lipid rich plaque: Secondary | ICD-10-CM | POA: Diagnosis not present

## 2016-04-02 DIAGNOSIS — I2510 Atherosclerotic heart disease of native coronary artery without angina pectoris: Secondary | ICD-10-CM

## 2016-04-02 DIAGNOSIS — B029 Zoster without complications: Secondary | ICD-10-CM | POA: Diagnosis not present

## 2016-04-02 MED ORDER — HYDROCODONE-ACETAMINOPHEN 5-325 MG PO TABS: 1.0000 | ORAL_TABLET | ORAL | 0 refills | Status: DC | PRN

## 2016-04-02 NOTE — Progress Notes (Signed)
   Subjective:    Patient ID: [name] [name], male    DOB: 1940-12-23, 75 y.o.   MRN: [id number]  HPI  Patient arrives for a recheck on shingles. Patient stated medication on 03/31/16 but seems to be spreading.  Patient experiencing substantial pain with this.  Patient worried about his eye. Was cautioned that this could get into his eye, he is very concerned about this.  No history of shingles in the past. Next  Positive significant left ear pain    Review of Systems No headache, no major weight loss or weight gain, no chest pain no back pain abdominal pain no change in bowel habits complete ROS otherwise negative     Objective:   Physical Exam  Alert vitals stable, NAD. Blood pressure good on repeat. HEENT normal. Lungs clear. Heart regular rate and rhythm. Skin reveals substantial lateral facial shingles involvement extending down to the neck. No involvement of the tip of the nose or the lateral nose or the periocular region  Dermatomal patterns were obtained on line and showed to the patient. The chance of this extending into the eye is pretty much not there      Assessment & Plan:  Impression as noted above see discussion. Maintain Valtrex. Hydrocodone prescribed. Easily 15 minutes spent most in discussion

## 2016-04-02 NOTE — Telephone Encounter (Signed)
Spoke with patient and informed him per Linzie Collin- Actually to clarify; if rash gets near his eyes or on the end of his nose, we need to know. If he has any questions before weekend, recommend recheck today. Patient verbalized understanding and stated that he would like to be rechecked. Patient sent to front desk to schedule and appointment.

## 2016-04-03 IMAGING — DX DG ABDOMEN 1V
2 series · 3 of 3 positions shown · non-contrast
Comparison: None.

CLINICAL DATA: Difficulty urinating with burning and pelvic
pressure for 6 days.

EXAM:
ABDOMEN - 1 VIEW

[abdomen supine (1 of 2)]
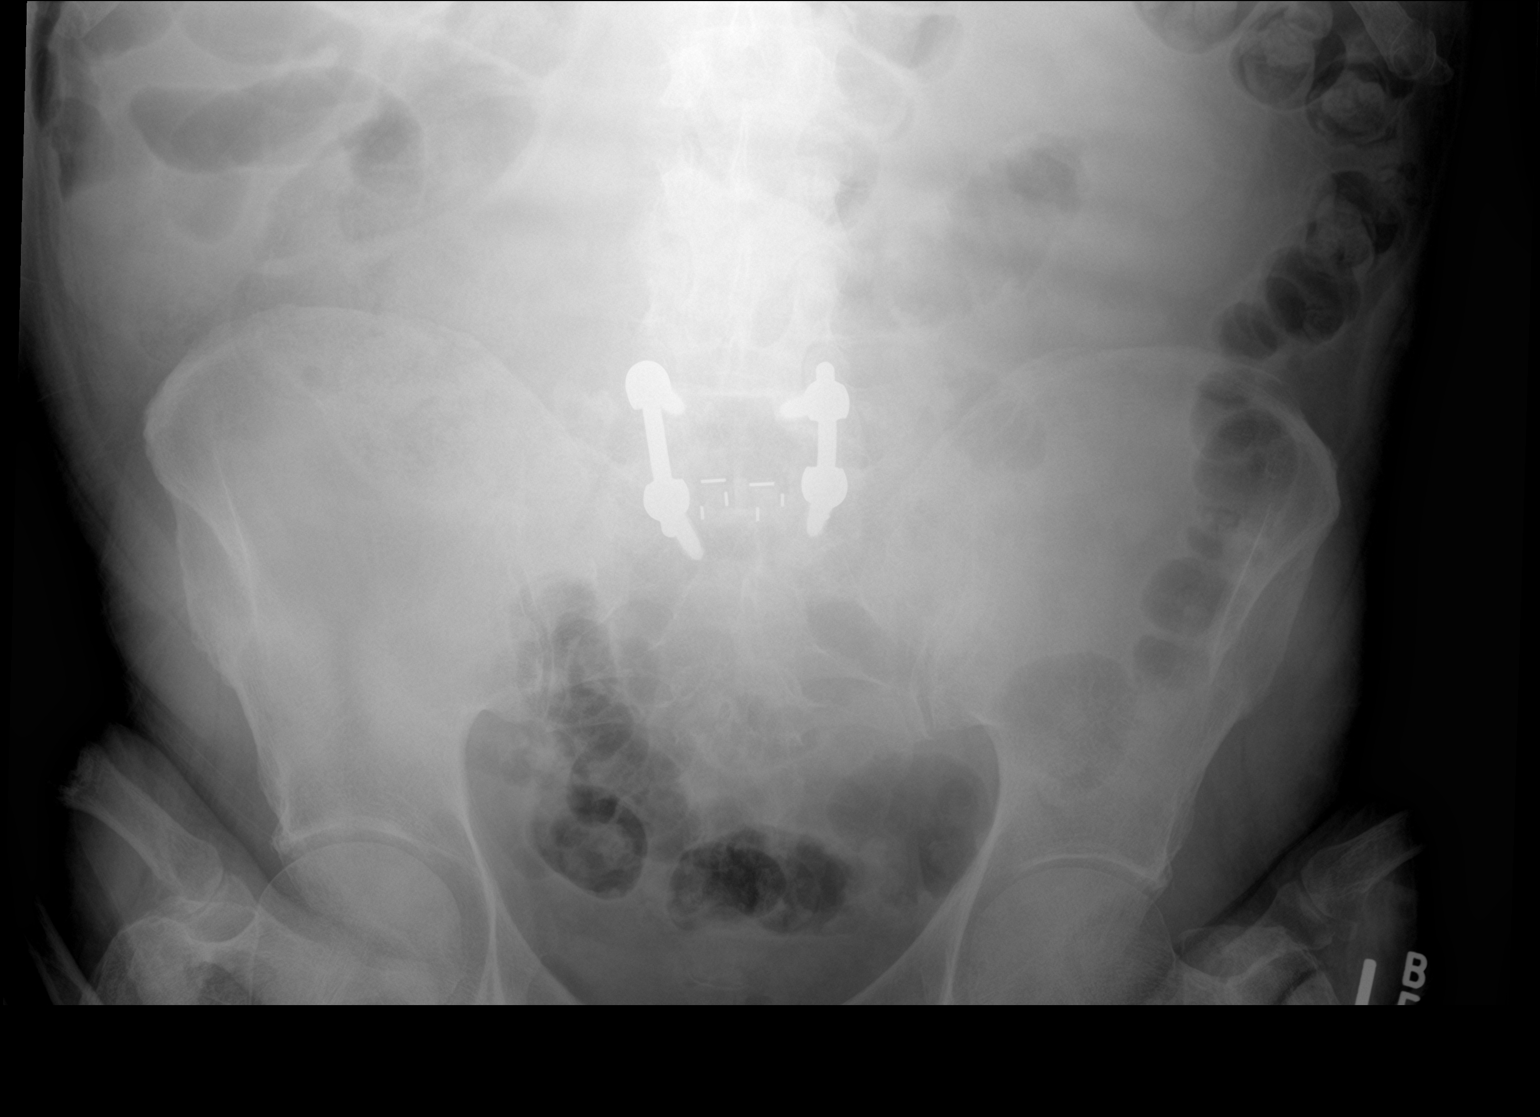

[Series 3: abdomen supine · 0.14mm/px · 2 of 2 slices shown (2 of 2)]
[im 1/2]
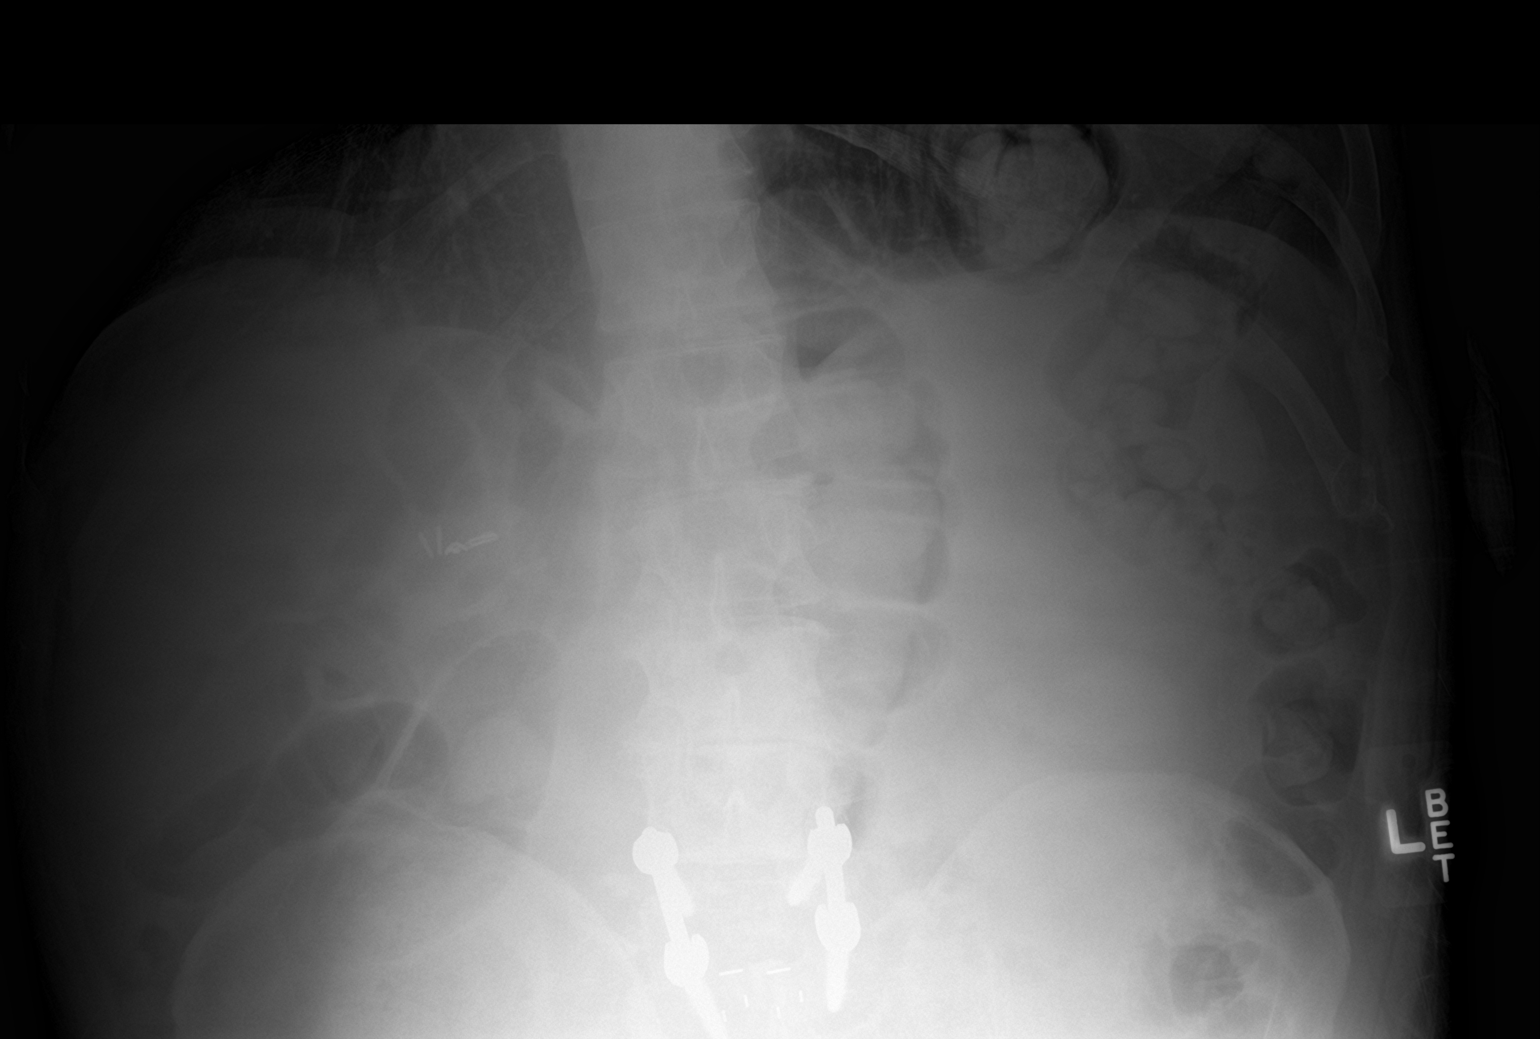
[im 2/2]
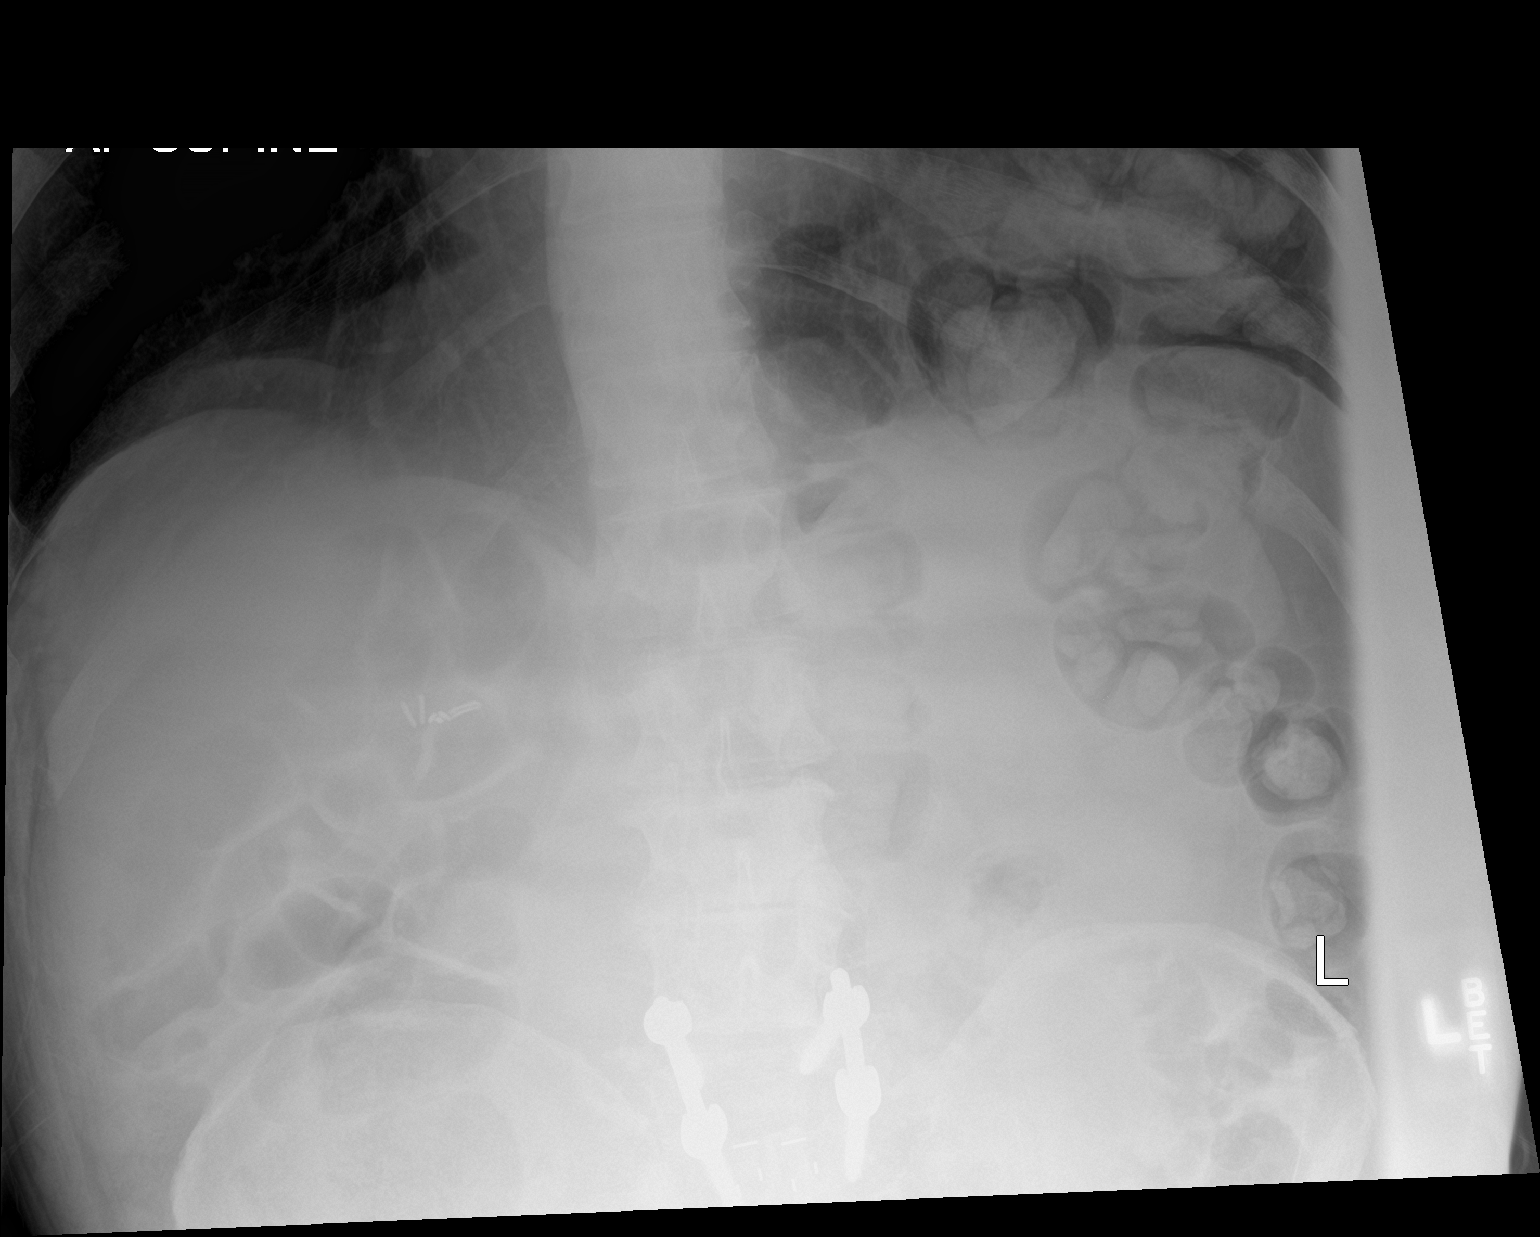

[3 of 3 positions shown; findings below may reference images not displayed]

FINDINGS: The bowel gas pattern is nonobstructive. Prominent stool burden in
the transverse colon is noted. No unexpected abdominal calcification
is seen. The patient is status post lower lumbar fusion.
IMPRESSION: No acute abnormality.

## 2016-04-15 ENCOUNTER — Ambulatory Visit (INDEPENDENT_AMBULATORY_CARE_PROVIDER_SITE_OTHER): Payer: Medicare Other | Admitting: Otolaryngology

## 2016-04-15 DIAGNOSIS — R040 Epistaxis: Secondary | ICD-10-CM

## 2016-04-19 ENCOUNTER — Other Ambulatory Visit: Payer: Self-pay | Admitting: Nurse Practitioner

## 2016-04-19 ENCOUNTER — Other Ambulatory Visit: Payer: Self-pay | Admitting: Family Medicine

## 2016-04-23 ENCOUNTER — Other Ambulatory Visit: Payer: Self-pay

## 2016-05-26 ENCOUNTER — Ambulatory Visit (INDEPENDENT_AMBULATORY_CARE_PROVIDER_SITE_OTHER): Payer: Medicare Other | Admitting: Urology

## 2016-05-26 DIAGNOSIS — R3915 Urgency of urination: Secondary | ICD-10-CM | POA: Diagnosis not present

## 2016-05-26 DIAGNOSIS — N401 Enlarged prostate with lower urinary tract symptoms: Secondary | ICD-10-CM | POA: Diagnosis not present

## 2016-06-04 DIAGNOSIS — H01024 Squamous blepharitis left upper eyelid: Secondary | ICD-10-CM | POA: Diagnosis not present

## 2016-06-04 DIAGNOSIS — L719 Rosacea, unspecified: Secondary | ICD-10-CM | POA: Diagnosis not present

## 2016-06-04 DIAGNOSIS — H16143 Punctate keratitis, bilateral: Secondary | ICD-10-CM | POA: Diagnosis not present

## 2016-06-04 DIAGNOSIS — H40013 Open angle with borderline findings, low risk, bilateral: Secondary | ICD-10-CM | POA: Diagnosis not present

## 2016-06-04 DIAGNOSIS — H33053 Total retinal detachment, bilateral: Secondary | ICD-10-CM | POA: Diagnosis not present

## 2016-06-04 DIAGNOSIS — H01025 Squamous blepharitis left lower eyelid: Secondary | ICD-10-CM | POA: Diagnosis not present

## 2016-06-04 DIAGNOSIS — H04123 Dry eye syndrome of bilateral lacrimal glands: Secondary | ICD-10-CM | POA: Diagnosis not present

## 2016-06-04 DIAGNOSIS — Z961 Presence of intraocular lens: Secondary | ICD-10-CM | POA: Diagnosis not present

## 2016-06-04 DIAGNOSIS — L718 Other rosacea: Secondary | ICD-10-CM | POA: Diagnosis not present

## 2016-06-04 DIAGNOSIS — H01022 Squamous blepharitis right lower eyelid: Secondary | ICD-10-CM | POA: Diagnosis not present

## 2016-06-04 DIAGNOSIS — H31092 Other chorioretinal scars, left eye: Secondary | ICD-10-CM | POA: Diagnosis not present

## 2016-06-04 DIAGNOSIS — H01021 Squamous blepharitis right upper eyelid: Secondary | ICD-10-CM | POA: Diagnosis not present

## 2016-06-21 ENCOUNTER — Other Ambulatory Visit: Payer: Self-pay | Admitting: Family Medicine

## 2016-06-23 DIAGNOSIS — R109 Unspecified abdominal pain: Secondary | ICD-10-CM | POA: Diagnosis not present

## 2016-06-23 DIAGNOSIS — D509 Iron deficiency anemia, unspecified: Secondary | ICD-10-CM | POA: Diagnosis not present

## 2016-06-23 DIAGNOSIS — Z86010 Personal history of colonic polyps: Secondary | ICD-10-CM | POA: Diagnosis not present

## 2016-06-23 DIAGNOSIS — R140 Abdominal distension (gaseous): Secondary | ICD-10-CM | POA: Diagnosis not present

## 2016-06-23 DIAGNOSIS — K580 Irritable bowel syndrome with diarrhea: Secondary | ICD-10-CM | POA: Diagnosis not present

## 2016-07-13 ENCOUNTER — Other Ambulatory Visit: Payer: Self-pay | Admitting: Family Medicine

## 2016-07-16 ENCOUNTER — Ambulatory Visit (INDEPENDENT_AMBULATORY_CARE_PROVIDER_SITE_OTHER): Payer: Medicare Other | Admitting: Podiatry

## 2016-07-16 ENCOUNTER — Ambulatory Visit (INDEPENDENT_AMBULATORY_CARE_PROVIDER_SITE_OTHER): Payer: Medicare Other

## 2016-07-16 ENCOUNTER — Encounter: Payer: Self-pay | Admitting: Podiatry

## 2016-07-16 VITALS — BP 134/74 | HR 47 | Resp 16 | Ht 73.0 in | Wt 205.0 lb

## 2016-07-16 DIAGNOSIS — M722 Plantar fascial fibromatosis: Secondary | ICD-10-CM

## 2016-07-16 MED ORDER — TRIAMCINOLONE ACETONIDE 10 MG/ML IJ SUSP
10.0000 mg | Freq: Once | INTRAMUSCULAR | Status: AC
  Administered 2016-07-16: 10 mg

## 2016-07-16 NOTE — Patient Instructions (Signed)

## 2016-07-16 NOTE — Progress Notes (Signed)
   Subjective:    Patient ID: [name] [name], male    DOB: 02/03/1941, 76 y.o.   MRN: [id number]  HPI Chief Complaint  Patient presents with  . Foot Pain    Right foot; bottom of heel; pt stated, "Pain is on and off for the past 6 to 7 months; has not bothered him in 2 weeks"      Review of Systems  HENT: Positive for hearing loss and tinnitus.   All other systems reviewed and are negative.      Objective:   Physical Exam        Assessment & Plan:

## 2016-07-18 NOTE — Progress Notes (Signed)
Subjective:     Patient ID: [name] [name], male   DOB: Oct 17, 1940, 76 y.o.   MRN: [id number]  HPI patient presents with a lot of pain in the right plantar heel at the insertional point of the tendon into the calcaneus and states it's been present for several months   Review of Systems  All other systems reviewed and are negative.      Objective:   Physical Exam  Constitutional: He is oriented to person, place, and time.  Cardiovascular: Intact distal pulses.   Musculoskeletal: Normal range of motion.  Neurological: He is oriented to person, place, and time.  Skin: Skin is warm.  Nursing note and vitals reviewed.  neurovascular status intact muscle strength was adequate range of motion within normal limits with patient found to have exquisite discomfort plantar aspect right heel at the insertional point of the tendon into the calcaneus. There is fluid buildup and there is no indications of pain within the calcaneal bone itself. Patient has good digital perfusion and is well oriented 3     Assessment:     Acute plantar fasciitis right    Plan:     H&P x-rays reviewed and injected the plantar fascia right 3 mg Kenalog 5 mg Xylocaine. Applied fascial brace gave instructions on reduced activity and shoe gear modifications along with physical therapy and reappoint for [place] to recheck again in 2 weeks  X-ray indicates no signs of stress fracture arthritis with small spur formation

## 2016-07-22 DIAGNOSIS — R140 Abdominal distension (gaseous): Secondary | ICD-10-CM | POA: Diagnosis not present

## 2016-08-18 NOTE — Progress Notes (Signed)
Patient ID: [name] [name], male   DOB: 16-Jun-1940, 76 y.o.   MRN: [id number]   [name] [name] is seen today in followup for his coronary artery disease. 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 Olevia Perches I believe back in 1999. His last Myoview in July of 2009 was nonischemic. He has good LV function. His been compliant with his meds. He has a 4.0 cm AAA  He has not had t any abdominal pain. He is active. 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 He prefers to have his duplex at [facility]. He continues to see Mickie Hillier for his primary care needs.    Event monitor  2012 PAC;s and a few short bursts of SVT 4 beats. Low dose Toprol started  Patient felt no different on it and stopped it. No palpitations.  Indicates intolerance to beta blockers toprol and bystolic .  Interestingly diarrhea occurred with both   Has drug sensitivities to antibiotics also Hesitant to start Ranexa due to interaction with Bactrim which is one of the only antibiotics he can take  Rough 2016 . 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  Since that has been  Anxious Wife also sick for first time and needed pacemaker.  He is wondering about starting Lexapro  Also been on coreg, atenolol cardizem CD and ranexa that he did not tolerate  Last visit cozaar held due to nasal congestion and sinus issues. Sees Teo Back on ARB no difference   Also has had shingles since last visit seeing Luking and on Valtrex  Studies reviewed: [place] 03/17/16 for AAA IMPRESSION: Abdominal aortic aneurysm again noted with maximum transverse diameter 4.0 x 3.7 cm. Recommend followup by ultrasound in 1 year. This recommendation follows ACR consensus guidelines: White Paper of the ACR Incidental Findings Committee II on Vascular Findings. J Am Coll Radiol 2013; 10:789-794.  .    ROS: Denies fever, malais, weight loss,  blurry vision, decreased visual acuity, cough, sputum, SOB, hemoptysis, pleuritic pain, palpitaitons, heartburn, abdominal pain, melena, lower extremity edema, claudication, or rash.  All other systems reviewed and negative  General: Affect appropriate Healthy:  appears stated age 75: normal Neck supple with no adenopathy JVP normal no bruits no thyromegaly Lungs Mild exp wheezing and good diaphragmatic motion Heart:  S1/S2 no murmur, no rub, gallop or click PMI normal Abdomen: benighn, BS positve, no tenderness, no AAA no bruit.  No HSM or HJR Distal pulses intact with no bruits No edema Neuro non-focal Skin warm and dry No muscular weakness   Current Outpatient Prescriptions  Medication Sig Dispense Refill  . albuterol (PROVENTIL) (2.5 MG/3ML) 0.083% nebulizer solution INHALE 1 VIAL VIA NEBULIZER FOUR TIMES DAILY. 375 mL 0  . aspirin EC 81 MG tablet Take 81 mg by mouth daily.    . cetirizine (ZYRTEC) 10 MG tablet Take 10 mg by mouth daily.    Marland Kitchen esomeprazole (NEXIUM) 20 MG capsule Take 20 mg by mouth daily at 12 noon.    Marland Kitchen FLOVENT HFA 220 MCG/ACT inhaler INHALE 2 PUFFS INTO LUNGS 2 TIMES DAILY. 12 g 0  . losartan (COZAAR) 25 MG tablet Take 1 tablet (25 mg total) by mouth daily. 90 tablet 3  . lovastatin (MEVACOR) 40 MG tablet TAKE 2 TABLETS BY MOUTH DAILY. 60 tablet 0  . montelukast (SINGULAIR) 10 MG tablet TAKE ONE  TABLET BY MOUTH AT BEDTIME AS NEEDED FOR ALLERGIES. (Patient taking differently: TAKE ONE TABLET BY MOUTH DAILY AS NEEDED FOR ALLERGIES.) 30 tablet 5  . Multiple Vitamin (MULTIVITAMIN WITH MINERALS) TABS tablet Take 1 tablet by mouth daily.    . nitroGLYCERIN (NITROSTAT) 0.4 MG SL tablet Place 1 tablet (0.4 mg total) under the tongue every 5 (five) minutes as needed. 25 tablet 1   No current facility-administered medications for this visit.     Allergies  Lasix [furosemide]; Cefzil [cefprozil]; Dexamethasone; Gabapentin; Neomycin; Tetracyclines & related;  Methocarbamol; and Penicillins  Electrocardiogram:  07/17/12  SR rate 80  Normal 07/04/14   SR rate 77  PAC  Nonspecific ST changes  10/06/15  SR ate 90 PAC nonspecific ST changes   Assessment and Plan  CAD:  Distant PCI RCA 1999 with normal myovue 2009  conitnue medical Rx intolerant to beta blocker  f/u exercise myouve since its been so long for any ischemic evaluaition  AAA:  Stable by most recent US 03/17/16 4.0 cm  f/u duplex 03/2017 HTN:  Improved but have to hold ARB for now  GERD:  Continue carafate and nexium f/u GI Asthma:  On inhalers no active wheezing  PAC/SVT:  Stable unable to take beta blockers Anxiety/Depression:  Better chose not to start SSRI  Wife's health is better which helps  ENT:  ARB held Shingles:  Finally improved Valtrex Rx done told him to ask Luking about new vaccine   F/U with me 6 months    Jenkins Rouge

## 2016-08-19 ENCOUNTER — Telehealth: Payer: Self-pay | Admitting: Family Medicine

## 2016-08-19 ENCOUNTER — Ambulatory Visit (INDEPENDENT_AMBULATORY_CARE_PROVIDER_SITE_OTHER): Payer: Medicare Other | Admitting: Cardiovascular Disease

## 2016-08-19 ENCOUNTER — Encounter: Payer: Self-pay | Admitting: Cardiovascular Disease

## 2016-08-19 DIAGNOSIS — R0600 Dyspnea, unspecified: Secondary | ICD-10-CM | POA: Diagnosis not present

## 2016-08-19 DIAGNOSIS — I2510 Atherosclerotic heart disease of native coronary artery without angina pectoris: Secondary | ICD-10-CM | POA: Diagnosis not present

## 2016-08-19 NOTE — Telephone Encounter (Signed)
Left message to return call 

## 2016-08-19 NOTE — Telephone Encounter (Signed)
Richardson Landry to see

## 2016-08-19 NOTE — Telephone Encounter (Signed)
Patient wanted to know if he could take the new shingles shot because he was allergic to the old one and he has had the shingles once and doesn't want them again. He wanted your advise on this.

## 2016-08-19 NOTE — Patient Instructions (Addendum)
Your physician wants you to follow-up in: 6 months Dr Blima Singer will receive a reminder letter in the mail two months in advance. If you don't receive a letter, please call our office to schedule the follow-up appointment.   Your physician has requested that you have a lexiscan myoview. For further information please visit [email or web address]. Please follow instruction sheet, as given.     Your physician recommends that you continue on your current medications as directed. Please refer to the Current Medication list given to you today.    If you need a refill on your cardiac medications before your next appointment, please call your pharmacy.       Thank you for choosing [facility] !

## 2016-08-19 NOTE — Addendum Note (Signed)
Addended by: Barbarann Ehlers A on: 08/19/2016 09:55 AM   Modules accepted: Orders

## 2016-08-19 NOTE — Telephone Encounter (Signed)
Yes can take, and likely a good idea, some insurances are not covering it at all, we do not give shingles vaccines here, we willl srite rx and pt needs to carry to his pharm

## 2016-08-19 NOTE — Telephone Encounter (Signed)
[facility] to discuss. Script at front window

## 2016-08-19 NOTE — Addendum Note (Signed)
Addended by: Barbarann Ehlers A on: 08/19/2016 10:00 AM   Modules accepted: Orders

## 2016-08-23 NOTE — Telephone Encounter (Signed)
Patient advised  That Dr Richardson Landry states that ,Yes you can take, and likely a good idea, some insurances are not covering it at all, we do not give shingles vaccines here, we willl write prescription and patient needs to carry to his pharmacy. Patient verbalized understanding.

## 2016-08-23 NOTE — Telephone Encounter (Signed)
Telephone call no answer 

## 2016-08-31 ENCOUNTER — Encounter (HOSPITAL_COMMUNITY): Payer: Self-pay

## 2016-08-31 ENCOUNTER — Ambulatory Visit (HOSPITAL_BASED_OUTPATIENT_CLINIC_OR_DEPARTMENT_OTHER)
Admission: RE | Admit: 2016-08-31 | Discharge: 2016-08-31 | Disposition: A | Discharge status: Home / Self Care | Payer: Medicare Other | Source: Ambulatory Visit | Attending: Cardiovascular Disease | Admitting: Cardiovascular Disease

## 2016-08-31 ENCOUNTER — Ambulatory Visit (HOSPITAL_COMMUNITY)
Admission: RE | Admit: 2016-08-31 | Discharge: 2016-08-31 | Disposition: A | Discharge status: Home / Self Care | Payer: Medicare Other | Source: Ambulatory Visit | Attending: Cardiovascular Disease | Admitting: Cardiovascular Disease

## 2016-08-31 DIAGNOSIS — I2510 Atherosclerotic heart disease of native coronary artery without angina pectoris: Secondary | ICD-10-CM

## 2016-08-31 DIAGNOSIS — R0600 Dyspnea, unspecified: Secondary | ICD-10-CM | POA: Diagnosis not present

## 2016-08-31 LAB — NM MYOCAR MULTI W/SPECT W/WALL MOTION / EF
LV dias vol: 84 mL (ref 62–150)
LV sys vol: 37 mL
Peak HR: 104 {beats}/min
RATE: 0.4
Rest HR: 78 {beats}/min
SDS: 2
SRS: 4
SSS: 6
TID: 1.1

## 2016-08-31 MED ORDER — TECHNETIUM TC 99M TETROFOSMIN IV KIT
30.0000 | PACK | Freq: Once | INTRAVENOUS | Status: AC | PRN
  Administered 2016-08-31: 30 via INTRAVENOUS

## 2016-08-31 MED ORDER — SODIUM CHLORIDE 0.9% FLUSH
INTRAVENOUS | Status: AC
  Administered 2016-08-31: 10 mL via INTRAVENOUS
  Filled 2016-08-31: qty 10

## 2016-08-31 MED ORDER — REGADENOSON 0.4 MG/5ML IV SOLN
INTRAVENOUS | Status: AC
  Administered 2016-08-31: 0.4 mg via INTRAVENOUS
  Filled 2016-08-31: qty 5

## 2016-08-31 MED ORDER — TECHNETIUM TC 99M TETROFOSMIN IV KIT
10.0000 | PACK | Freq: Once | INTRAVENOUS | Status: AC | PRN
  Administered 2016-08-31: 10.2 via INTRAVENOUS

## 2016-09-02 ENCOUNTER — Other Ambulatory Visit: Payer: Self-pay | Admitting: Family Medicine

## 2016-09-07 DIAGNOSIS — Z86010 Personal history of colonic polyps: Secondary | ICD-10-CM | POA: Diagnosis not present

## 2016-09-07 DIAGNOSIS — R140 Abdominal distension (gaseous): Secondary | ICD-10-CM | POA: Diagnosis not present

## 2016-09-08 DIAGNOSIS — D229 Melanocytic nevi, unspecified: Secondary | ICD-10-CM | POA: Diagnosis not present

## 2016-09-08 DIAGNOSIS — L719 Rosacea, unspecified: Secondary | ICD-10-CM | POA: Diagnosis not present

## 2016-09-08 DIAGNOSIS — Z85820 Personal history of malignant melanoma of skin: Secondary | ICD-10-CM | POA: Diagnosis not present

## 2016-09-16 ENCOUNTER — Emergency Department (HOSPITAL_COMMUNITY): Payer: Medicare Other

## 2016-09-16 ENCOUNTER — Encounter (HOSPITAL_COMMUNITY): Payer: Self-pay | Admitting: *Deleted

## 2016-09-16 ENCOUNTER — Inpatient Hospital Stay (HOSPITAL_COMMUNITY)
Admission: EM | Admit: 2016-09-16 | Discharge: 2016-09-17 | DRG: 194 | Disposition: A | Discharge status: Home / Self Care | Payer: Medicare Other | Attending: Internal Medicine | Admitting: Internal Medicine

## 2016-09-16 DIAGNOSIS — R0600 Dyspnea, unspecified: Secondary | ICD-10-CM | POA: Diagnosis not present

## 2016-09-16 DIAGNOSIS — I252 Old myocardial infarction: Secondary | ICD-10-CM | POA: Diagnosis not present

## 2016-09-16 DIAGNOSIS — I2510 Atherosclerotic heart disease of native coronary artery without angina pectoris: Secondary | ICD-10-CM | POA: Diagnosis present

## 2016-09-16 DIAGNOSIS — H9190 Unspecified hearing loss, unspecified ear: Secondary | ICD-10-CM | POA: Diagnosis present

## 2016-09-16 DIAGNOSIS — M1990 Unspecified osteoarthritis, unspecified site: Secondary | ICD-10-CM | POA: Diagnosis present

## 2016-09-16 DIAGNOSIS — Z7982 Long term (current) use of aspirin: Secondary | ICD-10-CM

## 2016-09-16 DIAGNOSIS — I10 Essential (primary) hypertension: Secondary | ICD-10-CM | POA: Diagnosis not present

## 2016-09-16 DIAGNOSIS — Z881 Allergy status to other antibiotic agents status: Secondary | ICD-10-CM | POA: Diagnosis not present

## 2016-09-16 DIAGNOSIS — Z888 Allergy status to other drugs, medicaments and biological substances status: Secondary | ICD-10-CM | POA: Diagnosis not present

## 2016-09-16 DIAGNOSIS — A419 Sepsis, unspecified organism: Secondary | ICD-10-CM | POA: Diagnosis not present

## 2016-09-16 DIAGNOSIS — Z87891 Personal history of nicotine dependence: Secondary | ICD-10-CM | POA: Diagnosis not present

## 2016-09-16 DIAGNOSIS — J441 Chronic obstructive pulmonary disease with (acute) exacerbation: Secondary | ICD-10-CM | POA: Diagnosis present

## 2016-09-16 DIAGNOSIS — J440 Chronic obstructive pulmonary disease with acute lower respiratory infection: Secondary | ICD-10-CM | POA: Diagnosis present

## 2016-09-16 DIAGNOSIS — J181 Lobar pneumonia, unspecified organism: Secondary | ICD-10-CM | POA: Diagnosis not present

## 2016-09-16 DIAGNOSIS — J410 Simple chronic bronchitis: Secondary | ICD-10-CM | POA: Diagnosis not present

## 2016-09-16 DIAGNOSIS — R0781 Pleurodynia: Secondary | ICD-10-CM

## 2016-09-16 DIAGNOSIS — Z883 Allergy status to other anti-infective agents status: Secondary | ICD-10-CM | POA: Diagnosis not present

## 2016-09-16 DIAGNOSIS — H409 Unspecified glaucoma: Secondary | ICD-10-CM | POA: Diagnosis present

## 2016-09-16 DIAGNOSIS — J189 Pneumonia, unspecified organism: Secondary | ICD-10-CM | POA: Diagnosis not present

## 2016-09-16 DIAGNOSIS — M545 Low back pain: Secondary | ICD-10-CM | POA: Diagnosis present

## 2016-09-16 DIAGNOSIS — K219 Gastro-esophageal reflux disease without esophagitis: Secondary | ICD-10-CM | POA: Diagnosis present

## 2016-09-16 DIAGNOSIS — E785 Hyperlipidemia, unspecified: Secondary | ICD-10-CM | POA: Diagnosis present

## 2016-09-16 DIAGNOSIS — R509 Fever, unspecified: Secondary | ICD-10-CM | POA: Diagnosis not present

## 2016-09-16 DIAGNOSIS — J449 Chronic obstructive pulmonary disease, unspecified: Secondary | ICD-10-CM | POA: Diagnosis present

## 2016-09-16 DIAGNOSIS — Z79899 Other long term (current) drug therapy: Secondary | ICD-10-CM

## 2016-09-16 DIAGNOSIS — E782 Mixed hyperlipidemia: Secondary | ICD-10-CM | POA: Diagnosis not present

## 2016-09-16 DIAGNOSIS — Z880 Allergy status to penicillin: Secondary | ICD-10-CM | POA: Diagnosis not present

## 2016-09-16 LAB — CBC WITH DIFFERENTIAL/PLATELET
Basophils Absolute: 0 10*3/uL (ref 0.0–0.1)
Basophils Relative: 0 %
Eosinophils Absolute: 0.2 10*3/uL (ref 0.0–0.7)
Eosinophils Relative: 1 %
HCT: 42.4 % (ref 39.0–52.0)
Hemoglobin: 14 g/dL (ref 13.0–17.0)
Lymphocytes Relative: 5 %
Lymphs Abs: 0.8 10*3/uL (ref 0.7–4.0)
MCH: 29.6 pg (ref 26.0–34.0)
MCHC: 33 g/dL (ref 30.0–36.0)
MCV: 89.6 fL (ref 78.0–100.0)
Monocytes Absolute: 0.6 10*3/uL (ref 0.1–1.0)
Monocytes Relative: 4 %
Neutro Abs: 15 10*3/uL — ABNORMAL HIGH (ref 1.7–7.7)
Neutrophils Relative %: 90 %
Platelets: 179 10*3/uL (ref 150–400)
RBC: 4.73 MIL/uL (ref 4.22–5.81)
RDW: 13.9 % (ref 11.5–15.5)
WBC: 16.6 10*3/uL — ABNORMAL HIGH (ref 4.0–10.5)

## 2016-09-16 LAB — COMPREHENSIVE METABOLIC PANEL
ALT: 23 U/L (ref 17–63)
AST: 28 U/L (ref 15–41)
Albumin: 4 g/dL (ref 3.5–5.0)
Alkaline Phosphatase: 62 U/L (ref 38–126)
Anion gap: 7 (ref 5–15)
BUN: 22 mg/dL — ABNORMAL HIGH (ref 6–20)
CO2: 28 mmol/L (ref 22–32)
Calcium: 9.2 mg/dL (ref 8.9–10.3)
Chloride: 106 mmol/L (ref 101–111)
Creatinine, Ser: 0.93 mg/dL (ref 0.61–1.24)
GFR calc Af Amer: >60 mL/min (ref >60)
GFR calc non Af Amer: >60 mL/min (ref >60)
Glucose, Bld: 121 mg/dL — ABNORMAL HIGH (ref 65–99)
Potassium: 3.9 mmol/L (ref 3.5–5.1)
Sodium: 141 mmol/L (ref 135–145)
Total Bilirubin: 0.9 mg/dL (ref 0.3–1.2)
Total Protein: 7.4 g/dL (ref 6.5–8.1)

## 2016-09-16 LAB — GLUCOSE, CAPILLARY: Glucose-Capillary: 145 mg/dL — ABNORMAL HIGH (ref 65–99)

## 2016-09-16 LAB — URINALYSIS, ROUTINE W REFLEX MICROSCOPIC
Bilirubin Urine: NEGATIVE
Glucose, UA: NEGATIVE mg/dL
Hgb urine dipstick: NEGATIVE
Ketones, ur: NEGATIVE mg/dL
Leukocytes, UA: NEGATIVE
Nitrite: NEGATIVE
Protein, ur: NEGATIVE mg/dL
Specific Gravity, Urine: 1.023 (ref 1.005–1.030)
pH: 5 (ref 5.0–8.0)

## 2016-09-16 LAB — I-STAT CG4 LACTIC ACID, ED: Lactic Acid, Venous: 1.74 mmol/L (ref 0.5–1.9)

## 2016-09-16 LAB — INFLUENZA PANEL BY PCR (TYPE A & B)
Influenza A By PCR: NEGATIVE
Influenza B By PCR: NEGATIVE

## 2016-09-16 LAB — I-STAT TROPONIN, ED: Troponin i, poc: 0.01 ng/mL (ref 0.00–0.08)

## 2016-09-16 LAB — BRAIN NATRIURETIC PEPTIDE: B Natriuretic Peptide: 44 pg/mL (ref 0.0–100.0)

## 2016-09-16 MED ORDER — IOPAMIDOL (ISOVUE-370) INJECTION 76%
100.0000 mL | Freq: Once | INTRAVENOUS | Status: AC | PRN
  Administered 2016-09-16: 100 mL via INTRAVENOUS

## 2016-09-16 MED ORDER — GUAIFENESIN ER 600 MG PO TB12
1200.0000 mg | ORAL_TABLET | Freq: Two times a day (BID) | ORAL | Status: DC
  Administered 2016-09-16 – 2016-09-17 (×3): 1200 mg via ORAL
  Filled 2016-09-16 (×3): qty 2

## 2016-09-16 MED ORDER — PRAVASTATIN SODIUM 40 MG PO TABS
80.0000 mg | ORAL_TABLET | Freq: Every day | ORAL | Status: DC
  Administered 2016-09-16: 80 mg via ORAL
  Filled 2016-09-16: qty 2

## 2016-09-16 MED ORDER — PANTOPRAZOLE SODIUM 40 MG PO TBEC
40.0000 mg | DELAYED_RELEASE_TABLET | Freq: Every day | ORAL | Status: DC
  Administered 2016-09-16 – 2016-09-17 (×2): 40 mg via ORAL
  Filled 2016-09-16 (×2): qty 1

## 2016-09-16 MED ORDER — HYDROCODONE-ACETAMINOPHEN 5-325 MG PO TABS
1.0000 | ORAL_TABLET | Freq: Once | ORAL | Status: AC
  Administered 2016-09-16: 1 via ORAL
  Filled 2016-09-16: qty 1

## 2016-09-16 MED ORDER — VANCOMYCIN HCL IN DEXTROSE 1-5 GM/200ML-% IV SOLN: 1000.0000 mg | Freq: Two times a day (BID) | INTRAVENOUS | Status: DC

## 2016-09-16 MED ORDER — ALBUTEROL SULFATE (2.5 MG/3ML) 0.083% IN NEBU
2.5000 mg | INHALATION_SOLUTION | Freq: Four times a day (QID) | RESPIRATORY_TRACT | Status: DC | PRN
  Administered 2016-09-16: 2.5 mg via RESPIRATORY_TRACT

## 2016-09-16 MED ORDER — BUDESONIDE 0.5 MG/2ML IN SUSP
1.0000 mg | Freq: Two times a day (BID) | RESPIRATORY_TRACT | Status: DC
  Administered 2016-09-16 (×2): 1 mg via RESPIRATORY_TRACT
  Administered 2016-09-17: 0.5 mg via RESPIRATORY_TRACT
  Filled 2016-09-16 (×3): qty 4

## 2016-09-16 MED ORDER — LORATADINE 10 MG PO TABS
10.0000 mg | ORAL_TABLET | Freq: Every day | ORAL | Status: DC
  Administered 2016-09-16 – 2016-09-17 (×2): 10 mg via ORAL
  Filled 2016-09-16 (×2): qty 1

## 2016-09-16 MED ORDER — LEVOFLOXACIN IN D5W 750 MG/150ML IV SOLN
750.0000 mg | Freq: Once | INTRAVENOUS | Status: AC
Start: 2016-09-16 — End: 2016-09-16
  Administered 2016-09-16: 750 mg via INTRAVENOUS
  Filled 2016-09-16: qty 150

## 2016-09-16 MED ORDER — KETOROLAC TROMETHAMINE 30 MG/ML IJ SOLN
30.0000 mg | Freq: Four times a day (QID) | INTRAMUSCULAR | Status: DC | PRN
  Administered 2016-09-16 – 2016-09-17 (×3): 30 mg via INTRAVENOUS
  Filled 2016-09-16 (×3): qty 1

## 2016-09-16 MED ORDER — VANCOMYCIN HCL IN DEXTROSE 1-5 GM/200ML-% IV SOLN
1000.0000 mg | Freq: Once | INTRAVENOUS | Status: AC
  Administered 2016-09-16: 1000 mg via INTRAVENOUS
  Filled 2016-09-16: qty 200

## 2016-09-16 MED ORDER — SODIUM CHLORIDE 0.9 % IV SOLN
INTRAVENOUS | Status: DC
  Administered 2016-09-16: 12:00:00 via INTRAVENOUS

## 2016-09-16 MED ORDER — ACETAMINOPHEN 325 MG PO TABS
650.0000 mg | ORAL_TABLET | Freq: Once | ORAL | Status: AC
  Administered 2016-09-16: 650 mg via ORAL
  Filled 2016-09-16: qty 2

## 2016-09-16 MED ORDER — LOSARTAN POTASSIUM 50 MG PO TABS
25.0000 mg | ORAL_TABLET | Freq: Every day | ORAL | Status: DC
  Administered 2016-09-16 – 2016-09-17 (×2): 25 mg via ORAL
  Filled 2016-09-16 (×2): qty 1

## 2016-09-16 MED ORDER — ACETAMINOPHEN 325 MG PO TABS
650.0000 mg | ORAL_TABLET | Freq: Four times a day (QID) | ORAL | Status: DC | PRN
  Administered 2016-09-16: 650 mg via ORAL
  Filled 2016-09-16: qty 2

## 2016-09-16 MED ORDER — MONTELUKAST SODIUM 10 MG PO TABS
10.0000 mg | ORAL_TABLET | Freq: Every day | ORAL | Status: DC
  Administered 2016-09-16: 10 mg via ORAL
  Filled 2016-09-16: qty 1

## 2016-09-16 MED ORDER — ENOXAPARIN SODIUM 40 MG/0.4ML ~~LOC~~ SOLN
40.0000 mg | SUBCUTANEOUS | Status: DC
  Administered 2016-09-16 – 2016-09-17 (×2): 40 mg via SUBCUTANEOUS
  Filled 2016-09-16 (×2): qty 0.4

## 2016-09-16 MED ORDER — SODIUM CHLORIDE 0.9 % IV BOLUS (SEPSIS)
500.0000 mL | Freq: Once | INTRAVENOUS | Status: AC
  Administered 2016-09-16: 500 mL via INTRAVENOUS

## 2016-09-16 MED ORDER — ASPIRIN EC 81 MG PO TBEC
81.0000 mg | DELAYED_RELEASE_TABLET | Freq: Every day | ORAL | Status: DC
  Administered 2016-09-16 – 2016-09-17 (×2): 81 mg via ORAL
  Filled 2016-09-16 (×2): qty 1

## 2016-09-16 MED ORDER — LEVOFLOXACIN IN D5W 750 MG/150ML IV SOLN
750.0000 mg | INTRAVENOUS | Status: DC
  Administered 2016-09-17: 750 mg via INTRAVENOUS
  Filled 2016-09-16: qty 150

## 2016-09-16 MED ORDER — IPRATROPIUM-ALBUTEROL 0.5-2.5 (3) MG/3ML IN SOLN
3.0000 mL | Freq: Four times a day (QID) | RESPIRATORY_TRACT | Status: DC
  Administered 2016-09-16 – 2016-09-17 (×4): 3 mL via RESPIRATORY_TRACT
  Filled 2016-09-16 (×5): qty 3

## 2016-09-16 NOTE — H&P (Signed)
History and Physical    [name] MCCLUNEY HDQ:[id number] DOB: November 11, 1940 DOA: 09/16/2016  PCP: Mickie Hillier, MD  Patient coming from: home  I have personally briefly reviewed patient's old medical records in [place]  Chief Complaint: fever  HPI: [name] [name] is a 76 y.o. male with medical history significant of COPD, was in his usual state of health yesterday evening when he developed shortness of breath and fevers. He reports having a mild cough which is generally nonproductive. Overnight his symptoms continue to worsen. He developed fevers and shaking chills. He developed left-sided pleuritic chest pain which was worse with cough and deep inspiration. Denies any nausea, vomiting, diarrhea. No dysuria. He has not had any sick contacts lately.  ED Course: In the emergency room, CT angio of the chest was negative for pulmonary embolus, but did show a left-sided pneumonia. He was noted to have a mildly elevated WBC count. Vitals were otherwise stable. He was noted to have a fever. He was referred for admission.  Review of Systems: As per HPI otherwise 10 point review of systems negative.    Past Medical History:  Diagnosis Date  . AAA (abdominal aortic aneurysm) ([place])    needs yearly ultrasound  . Allergy   . Anemia   . Arthritis   . Asthma   . BPH (benign prostatic hyperplasia)   . CAD (coronary artery disease)   . Cancer ([place])    skin cancer  . COPD (chronic obstructive pulmonary disease) ([place])   . Dysrhythmia    pt. states it can be fast at times  . GERD (gastroesophageal reflux disease)   . Glaucoma   . HOH (hard of hearing)   . Hypercholesterolemia   . Hypertension   . Impaired fasting glucose   . Low back pain   . MI (myocardial infarction) ([place]) 1999  . Thrush     Past Surgical History:  Procedure Laterality Date  . BACK SURGERY     x 3  . CARDIAC CATHETERIZATION     angioplasty  . CATARACT EXTRACTION W/PHACO  03/20/2012   Procedure: CATARACT EXTRACTION  PHACO AND INTRAOCULAR LENS PLACEMENT (IOC);  Surgeon: Williams Che, MD;  Location: [facility];  Service: Ophthalmology;  Laterality: Right;  CDE:  8.45  . CATARACT EXTRACTION W/PHACO Left 04/02/2013   Procedure: CATARACT EXTRACTION PHACO AND INTRAOCULAR LENS PLACEMENT (IOC);  Surgeon: Williams Che, MD;  Location: [facility];  Service: Ophthalmology;  Laterality: Left;  CDE:  6.50  . CHOLECYSTECTOMY  2000  . COLONOSCOPY  2009   repeat 5 years  . ESOPHAGOGASTRODUODENOSCOPY    . HERNIA REPAIR Left    inguinal  . LAPAROSCOPIC PARTIAL COLECTOMY N/A 06/11/2013   Procedure: LAPAROSCOPIC HAND ASSISTED PARTIAL COLECTOMY;  Surgeon: Jamesetta So, MD;  Location: [facility];  Service: General;  Laterality: N/A;  . right eye detached retina Bilateral   . YAG LASER APPLICATION Left 11/94/1740   Procedure: YAG LASER APPLICATION;  Surgeon: Williams Che, MD;  Location: [facility];  Service: Ophthalmology;  Laterality: Left;     reports that he quit smoking about 13 years ago. His smoking use included Cigarettes. He has a 35.00 pack-year smoking history. He has never used smokeless tobacco. He reports that he does not drink alcohol or use drugs.  Allergies  Allergen Reactions  . Lasix [Furosemide] Rash  . Cefzil [Cefprozil] Nausea Only  . Dexamethasone Swelling  . Gabapentin     Legs swell up   .  Neomycin Other (See Comments)    Patient can't remember.  . Tetracyclines & Related Itching  . Methocarbamol Swelling and Rash  . Penicillins Rash    Family History  Problem Relation Age of Onset  . Hypertension Mother   . COPD Father   . Cancer Brother     brain     Prior to Admission medications   Medication Sig Start Date End Date Taking? Authorizing Provider  albuterol (PROVENTIL) (2.5 MG/3ML) 0.083% nebulizer solution INHALE 1 VIAL VIA NEBULIZER FOUR TIMES DAILY. 04/19/16  Yes Kathyrn Drown, MD  aspirin EC 81 MG tablet Take 81 mg by mouth daily.   Yes Historical Provider, MD  cetirizine (ZYRTEC)  10 MG tablet Take 10 mg by mouth daily.   Yes Historical Provider, MD  esomeprazole (NEXIUM) 20 MG capsule Take 20 mg by mouth daily at 12 noon.   Yes Historical Provider, MD  FLOVENT HFA 220 MCG/ACT inhaler INHALE 2 PUFFS INTO LUNGS 2 TIMES DAILY. 07/13/16  Yes Mikey Kirschner, MD  losartan (COZAAR) 25 MG tablet Take 1 tablet (25 mg total) by mouth daily. 10/06/15  Yes Josue Hector, MD  lovastatin (MEVACOR) 40 MG tablet TAKE 2 TABLETS BY MOUTH DAILY. 06/21/16  Yes Mikey Kirschner, MD  montelukast (SINGULAIR) 10 MG tablet TAKE ONE TABLET BY MOUTH AT BEDTIME AS NEEDED FOR ALLERGIES. 09/02/16  Yes Mikey Kirschner, MD  Multiple Vitamin (MULTIVITAMIN WITH MINERALS) TABS tablet Take 1 tablet by mouth daily.   Yes Historical Provider, MD  nitroGLYCERIN (NITROSTAT) 0.4 MG SL tablet Place 1 tablet (0.4 mg total) under the tongue every 5 (five) minutes as needed. 01/20/16  Yes Josue Hector, MD    Physical Exam: Vitals:   09/16/16 0630 09/16/16 0700 09/16/16 0734 09/16/16 0749  BP: 114/78 113/84  (!) 163/134  Pulse: (!) 102 94  (!) 108  Resp: 13 15  18   Temp:   98 F (36.7 C) 98.4 F (36.9 C)  TempSrc:   Oral Oral  SpO2: 93% 94%    Weight:    96.2 kg (212 lb 1.3 oz)  Height:    6\' 1"  (1.854 m)    Constitutional: NAD, calm, comfortable Vitals:   09/16/16 0630 09/16/16 0700 09/16/16 0734 09/16/16 0749  BP: 114/78 113/84  (!) 163/134  Pulse: (!) 102 94  (!) 108  Resp: 13 15  18   Temp:   98 F (36.7 C) 98.4 F (36.9 C)  TempSrc:   Oral Oral  SpO2: 93% 94%    Weight:    96.2 kg (212 lb 1.3 oz)  Height:    6\' 1"  (1.854 m)   Eyes: PERRL, lids and conjunctivae normal ENMT: Mucous membranes are moist. Posterior pharynx clear of any exudate or lesions.Normal dentition.  Neck: normal, supple, no masses, no thyromegaly Respiratory: clear to auscultation bilaterally, no wheezing, no crackles. Normal respiratory effort. No accessory muscle use.  Cardiovascular: Regular rate and rhythm, no murmurs  / rubs / gallops. No extremity edema. 2+ pedal pulses. No carotid bruits.  Abdomen: no tenderness, no masses palpated. No hepatosplenomegaly. Bowel sounds positive.  Musculoskeletal: no clubbing / cyanosis. No joint deformity upper and lower extremities. Good ROM, no contractures. Normal muscle tone.  Skin: no rashes, lesions, ulcers. No induration Neurologic: CN 2-12 grossly intact. Sensation intact, DTR normal. Strength 5/5 in all 4.  Psychiatric: Normal judgment and insight. Alert and oriented x 3. Normal mood.   Labs on Admission: I have personally reviewed following  labs and imaging studies  CBC:  Recent Labs Lab 09/16/16 0235  WBC 16.6*  NEUTROABS 15.0*  HGB 14.0  HCT 42.4  MCV 89.6  PLT 546   Basic Metabolic Panel:  Recent Labs Lab 09/16/16 0235  NA 141  K 3.9  CL 106  CO2 28  GLUCOSE 121*  BUN 22*  CREATININE 0.93  CALCIUM 9.2   GFR: Estimated Creatinine Clearance: 83.9 mL/min (by C-G formula based on SCr of 0.93 mg/dL). Liver Function Tests:  Recent Labs Lab 09/16/16 0235  AST 28  ALT 23  ALKPHOS 62  BILITOT 0.9  PROT 7.4  ALBUMIN 4.0   No results for input(s): LIPASE, AMYLASE in the last 168 hours. No results for input(s): AMMONIA in the last 168 hours. Coagulation Profile: No results for input(s): INR, PROTIME in the last 168 hours. Cardiac Enzymes: No results for input(s): CKTOTAL, CKMB, CKMBINDEX, TROPONINI in the last 168 hours. BNP (last 3 results) No results for input(s): PROBNP in the last 8760 hours. HbA1C: No results for input(s): HGBA1C in the last 72 hours. CBG: No results for input(s): GLUCAP in the last 168 hours. Lipid Profile: No results for input(s): CHOL, HDL, LDLCALC, TRIG, CHOLHDL, LDLDIRECT in the last 72 hours. Thyroid Function Tests: No results for input(s): TSH, T4TOTAL, FREET4, T3FREE, THYROIDAB in the last 72 hours. Anemia Panel: No results for input(s): VITAMINB12, FOLATE, FERRITIN, TIBC, IRON, RETICCTPCT in the  last 72 hours. Urine analysis:    Component Value Date/Time   COLORURINE YELLOW 09/16/2016 0242   APPEARANCEUR CLEAR 09/16/2016 0242   LABSPEC 1.023 09/16/2016 0242   PHURINE 5.0 09/16/2016 0242   GLUCOSEU NEGATIVE 09/16/2016 0242   HGBUR NEGATIVE 09/16/2016 0242   BILIRUBINUR NEGATIVE 09/16/2016 0242   BILIRUBINUR ++ 03/05/2016 1544   KETONESUR NEGATIVE 09/16/2016 0242   PROTEINUR NEGATIVE 09/16/2016 0242   UROBILINOGEN 2.0 03/05/2016 1544   UROBILINOGEN 0.2 12/04/2014 0240   NITRITE NEGATIVE 09/16/2016 0242   LEUKOCYTESUR NEGATIVE 09/16/2016 0242    Radiological Exams on Admission: Dg Chest 2 View  Result Date: 09/16/2016 CLINICAL DATA:  Dyspnea since 20:00.  Fever. EXAM: CHEST  2 VIEW COMPARISON:  10/30/2014 FINDINGS: Stable mild left hemidiaphragm elevation. No confluent airspace consolidation. New mild interstitial thickening or fluid. New mild vascular prominence. IMPRESSION: Vascular and interstitial prominence may represent mild congestive heart failure. No alveolar edema or airspace consolidation. No effusion. Electronically Signed   By: Andreas Newport M.D.   On: 09/16/2016 03:39   Ct Angio Chest Pe W Or Wo Contrast  Result Date: 09/16/2016 CLINICAL DATA:  Fever and dyspnea since 20:00 last night EXAM: CT ANGIOGRAPHY CHEST WITH CONTRAST TECHNIQUE: Multidetector CT imaging of the chest was performed using the standard protocol during bolus administration of intravenous contrast. Multiplanar CT image reconstructions and MIPs were obtained to evaluate the vascular anatomy. CONTRAST:  100 mL Isovue 370 intravenous COMPARISON:  Radiographs 09/16/2016 FINDINGS: Cardiovascular: Satisfactory opacification of the pulmonary arteries to the segmental level. No evidence of pulmonary embolism. Normal heart size. No pericardial effusion. Mediastinum/Nodes: No enlarged mediastinal, hilar, or axillary lymph nodes. Thyroid gland, trachea, and esophagus demonstrate no significant findings.  Lungs/Pleura: Consolidation in the left lower lobe superior segment. This may represent pneumonia. No pleural effusion. Right lung is clear. Upper Abdomen: No acute abnormality. Musculoskeletal: No significant skeletal lesion. Review of the MIP images confirms the above findings. IMPRESSION: 1. Negative for acute pulmonary embolism. 2. Superior segment left lower lobe focal consolidation. This may represent pneumonia. Followup PA and  lateral chest X-ray is recommended in 3-4 weeks following trial of antibiotic therapy to ensure resolution and exclude underlying malignancy. Electronically Signed   By: Andreas Newport M.D.   On: 09/16/2016 06:03    EKG: Independently reviewed. Sinus tachycardia  Assessment/Plan Active Problems:   Mixed hyperlipidemia   COPD (chronic obstructive pulmonary disease) (HCC)   CAP (community acquired pneumonia)   Pleuritic chest pain   HTN (hypertension)    1. Community-acquired pneumonia. Due to patient's multiple antibiotic allergies, and be started on Levaquin. Continue pulmonary hygiene. Check urinary antigens for pneumococcus and legionella  2. COPD. Appears stable at this time. No wheezing. Continue on inhaled steroids, bronchodilators and mucolytics.  3. Hypertension. Continue on losartan  4. Hyperlipidemia. Continue statin  5. Pleuritic chest pain. Related to pneumonia. Treat with NSAIDs.  DVT prophylaxis: lovenox Code Status: full Family Communication:  Disposition Plan: discharge home once improved Consults called:  Admission status: inpatient   Mavric Cortright MD [facility] Pager [phone]  If 7PM-7AM, please contact night-coverage [email or web address] Password [email or web address]  09/16/2016, 10:13 AM

## 2016-09-16 NOTE — Progress Notes (Signed)
Pharmacy Antibiotic Note  [name] [name] is a 76 y.o. male admitted on 09/16/2016 with pneumonia.  Pharmacy has been consulted for levaquin and vancomycin dosing. Initial doses ordered in the ED  Plan: Cont levaquin 750 mg IV q24 hours Cont vancomycin 1 gm IV q12 hours f/u renal function, cultures and clinical course  Height: 6\' 1"  (185.4 cm) Weight: 212 lb 1.3 oz (96.2 kg) IBW/kg (Calculated) : 79.9  Temp (24hrs), Avg:99.5 F (37.5 C), Min:98 F (36.7 C), Max:102 F (38.9 C)   Recent Labs Lab 09/16/16 0235 09/16/16 0252  WBC 16.6*  --   CREATININE 0.93  --   LATICACIDVEN  --  1.74    Estimated Creatinine Clearance: 83.9 mL/min (by C-G formula based on SCr of 0.93 mg/dL).    Allergies  Allergen Reactions  . Lasix [Furosemide] Rash  . Cefzil [Cefprozil] Nausea Only  . Dexamethasone Swelling  . Gabapentin     Legs swell up   . Neomycin Other (See Comments)    Patient can't remember.  . Tetracyclines & Related Itching  . Methocarbamol Swelling and Rash  . Penicillins Rash    Antimicrobials this admission: levaquin 5/3 >>  vanc 5/3>>    Thank you for allowing pharmacy to be a part of this patient's care.  Brandn Mcgath, [place] 09/16/2016 8:59 AM

## 2016-09-16 NOTE — ED Provider Notes (Signed)
[facility] DEPT Provider Note   CSN: [id number] Arrival date & time: 09/16/16  0206     History   Chief Complaint Chief Complaint  Patient presents with  . chills, body aches    HPI [name] VELTRE is a 76 y.o. male.  Patient presents to the emergency department for evaluation of shaking chills and shortness of breath. Patient reports that he had been feeling well over the course of the day. He awakened from sleep tonight with chills, shaking all over. He noticed he was feeling somewhat short of breath, used his nebulizer without improvement. He has not had any associated nausea, vomiting or diarrhea. He does report an occasional cough, but no worse than usual. He has not noticed any urinary symptoms. Patient denies chest pain.      Past Medical History:  Diagnosis Date  . AAA (abdominal aortic aneurysm) ([place])    needs yearly ultrasound  . Allergy   . Anemia   . Arthritis   . Asthma   . BPH (benign prostatic hyperplasia)   . CAD (coronary artery disease)   . Cancer ([place])    skin cancer  . COPD (chronic obstructive pulmonary disease) ([place])   . Dysrhythmia    pt. states it can be fast at times  . GERD (gastroesophageal reflux disease)   . Glaucoma   . HOH (hard of hearing)   . Hypercholesterolemia   . Hypertension   . Impaired fasting glucose   . Low back pain   . MI (myocardial infarction) ([place]) 1999  . Thrush     Patient Active Problem List   Diagnosis Date Noted  . Herpes zoster without complication 11/10/9483  . Oral candidiasis 04/01/2016  . Prostate hypertrophy 03/06/2016  . Depression 04/06/2015  . Ulnar neuropathy 12/14/2014  . Leg swelling 12/05/2014  . Neuropathic pain 12/05/2014  . S/P spinal surgery 12/05/2014  . Venous stasis dermatitis 12/05/2014  . Insomnia 12/05/2014  . Generalized pain   . UTI (lower urinary tract infection) 11/11/2014  . Thrush of mouth and esophagus ([place]) 11/11/2014  . Urinary retention   . Urinary tract infectious  disease   . Lumbar spondylosis 11/05/2014  . Colon neoplasm 06/11/2013  . GERD (gastroesophageal reflux disease) 01/19/2012  . PAC (premature atrial contraction) 09/15/2010  . ELEVATED BP READING WITHOUT DX HYPERTENSION 05/29/2009  . Mixed hyperlipidemia 04/15/2008  . BEN HTN HEART DISEASE WITHOUT HEART FAIL 04/15/2008  . Coronary atherosclerosis 04/15/2008  . Abdominal aortic aneurysm ([place]) 04/15/2008  . COPD (chronic obstructive pulmonary disease) ([place]) 04/15/2008    Past Surgical History:  Procedure Laterality Date  . BACK SURGERY     x 3  . CARDIAC CATHETERIZATION     angioplasty  . CATARACT EXTRACTION W/PHACO  03/20/2012   Procedure: CATARACT EXTRACTION PHACO AND INTRAOCULAR LENS PLACEMENT (IOC);  Surgeon: Williams Che, MD;  Location: [facility];  Service: Ophthalmology;  Laterality: Right;  CDE:  8.45  . CATARACT EXTRACTION W/PHACO Left 04/02/2013   Procedure: CATARACT EXTRACTION PHACO AND INTRAOCULAR LENS PLACEMENT (I[facility]);  Surgeon: Williams Che, MD;  Location: [facility];  Service: Ophthalmology;  Laterality: Left;  CDE:  6.50  . CHOLECYSTECTOMY  2000  . COLONOSCOPY  2009   repeat 5 years  . ESOPHAGOGASTRODUODENOSCOPY    . HERNIA REPAIR Left    inguinal  . LAPAROSCOPIC PARTIAL COLECTOMY N/A 06/11/2013   Procedure: LAPAROSCOPIC HAND ASSISTED PARTIAL COLECTOMY;  Surgeon: Jamesetta So, MD;  Location: [facility];  Service: General;  Laterality:  N/A;  . right eye detached retina Bilateral   . YAG LASER APPLICATION Left 37/62/8315   Procedure: YAG LASER APPLICATION;  Surgeon: Williams Che, MD;  Location: [facility];  Service: Ophthalmology;  Laterality: Left;       Home Medications    Prior to Admission medications   Medication Sig Start Date End Date Taking? Authorizing Provider  albuterol (PROVENTIL) (2.5 MG/3ML) 0.083% nebulizer solution INHALE 1 VIAL VIA NEBULIZER FOUR TIMES DAILY. 04/19/16  Yes Kathyrn Drown, MD  aspirin EC 81 MG tablet Take 81 mg by mouth daily.   Yes  Historical Provider, MD  cetirizine (ZYRTEC) 10 MG tablet Take 10 mg by mouth daily.   Yes Historical Provider, MD  esomeprazole (NEXIUM) 20 MG capsule Take 20 mg by mouth daily at 12 noon.   Yes Historical Provider, MD  FLOVENT HFA 220 MCG/ACT inhaler INHALE 2 PUFFS INTO LUNGS 2 TIMES DAILY. 07/13/16  Yes Mikey Kirschner, MD  losartan (COZAAR) 25 MG tablet Take 1 tablet (25 mg total) by mouth daily. 10/06/15  Yes Josue Hector, MD  lovastatin (MEVACOR) 40 MG tablet TAKE 2 TABLETS BY MOUTH DAILY. 06/21/16  Yes Mikey Kirschner, MD  montelukast (SINGULAIR) 10 MG tablet TAKE ONE TABLET BY MOUTH AT BEDTIME AS NEEDED FOR ALLERGIES. 09/02/16  Yes Mikey Kirschner, MD  Multiple Vitamin (MULTIVITAMIN WITH MINERALS) TABS tablet Take 1 tablet by mouth daily.   Yes Historical Provider, MD  nitroGLYCERIN (NITROSTAT) 0.4 MG SL tablet Place 1 tablet (0.4 mg total) under the tongue every 5 (five) minutes as needed. 01/20/16  Yes Josue Hector, MD    Family History Family History  Problem Relation Age of Onset  . Hypertension Mother   . COPD Father   . Cancer Brother     brain    Social History Social History  Substance Use Topics  . Smoking status: Former Smoker    Packs/day: 1.00    Years: 35.00    Types: Cigarettes    Quit date: 03/28/2003  . Smokeless tobacco: Never Used  . Alcohol use No     Allergies   Lasix [furosemide]; Cefzil [cefprozil]; Dexamethasone; Gabapentin; Neomycin; Tetracyclines & related; Methocarbamol; and Penicillins   Review of Systems Review of Systems  Constitutional: Positive for chills.  Respiratory: Positive for cough and shortness of breath.   All other systems reviewed and are negative.    Physical Exam Updated Vital Signs BP 114/80   Pulse (!) 106   Temp 99.5 F (37.5 C) (Oral)   Resp 19   Ht 6\' 1"  (1.854 m)   Wt 210 lb (95.3 kg)   SpO2 96%   BMI 27.71 kg/m   Physical Exam  Constitutional: He is oriented to person, place, and time. He appears  well-developed and well-nourished. No distress.  HENT:  Head: Normocephalic and atraumatic.  Right Ear: Hearing normal.  Left Ear: Hearing normal.  Nose: Nose normal.  Mouth/Throat: Oropharynx is clear and moist and mucous membranes are normal.  Eyes: Conjunctivae and EOM are normal. Pupils are equal, round, and reactive to light.  Neck: Normal range of motion. Neck supple.  Cardiovascular: Regular rhythm, S1 normal and S2 normal.  Tachycardia present.  Exam reveals no gallop and no friction rub.   No murmur heard. Pulmonary/Chest: Effort normal and breath sounds normal. No respiratory distress. He exhibits no tenderness.  Abdominal: Soft. Normal appearance and bowel sounds are normal. There is no hepatosplenomegaly. There is no tenderness. There is  no rebound, no guarding, no tenderness at McBurney's point and negative Murphy's sign. No hernia.  Musculoskeletal: Normal range of motion.  Neurological: He is alert and oriented to person, place, and time. He has normal strength. No cranial nerve deficit or sensory deficit. Coordination normal. GCS eye subscore is 4. GCS verbal subscore is 5. GCS motor subscore is 6.  Skin: Skin is warm, dry and intact. No rash noted. No cyanosis.  Psychiatric: He has a normal mood and affect. His speech is normal and behavior is normal. Thought content normal.  Nursing note and vitals reviewed.    ED Treatments / Results  Labs (all labs ordered are listed, but only abnormal results are displayed) Labs Reviewed  CBC WITH DIFFERENTIAL/PLATELET - Abnormal; Notable for the following:       Result Value   WBC 16.6 (*)    Neutro Abs 15.0 (*)    All other components within normal limits  COMPREHENSIVE METABOLIC PANEL - Abnormal; Notable for the following:    Glucose, Bld 121 (*)    BUN 22 (*)    All other components within normal limits  CULTURE, BLOOD (ROUTINE X 2)  CULTURE, BLOOD (ROUTINE X 2)  URINE CULTURE  BRAIN NATRIURETIC PEPTIDE  URINALYSIS,  ROUTINE W REFLEX MICROSCOPIC  INFLUENZA PANEL BY PCR (TYPE A & B)  I-STAT CG4 LACTIC ACID, ED  I-STAT TROPOININ, ED    EKG  EKG Interpretation  Date/Time:  Thursday Sep 16 2016 02:53:52 EDT Ventricular Rate:  111 PR Interval:    QRS Duration: 89 QT Interval:  274 QTC Calculation: 373 R Axis:   68 Text Interpretation:  Sinus tachycardia Consider left atrial enlargement Abnormal R-wave progression, early transition Nonspecific repol abnormality, diffuse leads No significant change since last tracing Confirmed by POLLINA  MD, CHRISTOPHER [phone]) on 09/16/2016 2:57:00 AM       Radiology Dg Chest 2 View  Result Date: 09/16/2016 CLINICAL DATA:  Dyspnea since 20:00.  Fever. EXAM: CHEST  2 VIEW COMPARISON:  10/30/2014 FINDINGS: Stable mild left hemidiaphragm elevation. No confluent airspace consolidation. New mild interstitial thickening or fluid. New mild vascular prominence. IMPRESSION: Vascular and interstitial prominence may represent mild congestive heart failure. No alveolar edema or airspace consolidation. No effusion. Electronically Signed   By: Andreas Newport M.D.   On: 09/16/2016 03:39   Ct Angio Chest Pe W Or Wo Contrast  Result Date: 09/16/2016 CLINICAL DATA:  Fever and dyspnea since 20:00 last night EXAM: CT ANGIOGRAPHY CHEST WITH CONTRAST TECHNIQUE: Multidetector CT imaging of the chest was performed using the standard protocol during bolus administration of intravenous contrast. Multiplanar CT image reconstructions and MIPs were obtained to evaluate the vascular anatomy. CONTRAST:  100 mL Isovue 370 intravenous COMPARISON:  Radiographs 09/16/2016 FINDINGS: Cardiovascular: Satisfactory opacification of the pulmonary arteries to the segmental level. No evidence of pulmonary embolism. Normal heart size. No pericardial effusion. Mediastinum/Nodes: No enlarged mediastinal, hilar, or axillary lymph nodes. Thyroid gland, trachea, and esophagus demonstrate no significant findings.  Lungs/Pleura: Consolidation in the left lower lobe superior segment. This may represent pneumonia. No pleural effusion. Right lung is clear. Upper Abdomen: No acute abnormality. Musculoskeletal: No significant skeletal lesion. Review of the MIP images confirms the above findings. IMPRESSION: 1. Negative for acute pulmonary embolism. 2. Superior segment left lower lobe focal consolidation. This may represent pneumonia. Followup PA and lateral chest X-ray is recommended in 3-4 weeks following trial of antibiotic therapy to ensure resolution and exclude underlying malignancy. Electronically Signed   By: Quillian Quince  Armandina Stammer M.D.   On: 09/16/2016 06:03    Procedures Procedures (including critical care time)  Medications Ordered in ED Medications  levofloxacin (LEVAQUIN) IVPB 750 mg (750 mg Intravenous New Bag/Given 09/16/16 0503)  vancomycin (VANCOCIN) IVPB 1000 mg/200 mL premix (not administered)  acetaminophen (TYLENOL) tablet 650 mg (650 mg Oral Given 09/16/16 0248)  sodium chloride 0.9 % bolus 500 mL (0 mLs Intravenous Stopped 09/16/16 0345)  sodium chloride 0.9 % bolus 500 mL (0 mLs Intravenous Stopped 09/16/16 0601)  iopamidol (ISOVUE-370) 76 % injection 100 mL (100 mLs Intravenous Contrast Given 09/16/16 0534)     Initial Impression / Assessment and Plan / ED Course  I have reviewed the triage vital signs and the nursing notes.  Pertinent labs & imaging results that were available during my care of the patient were reviewed by me and considered in my medical decision making (see chart for details).     Patient presents to the emergency department for evaluation of shortness of breath with fever. Patient had sudden onset of shaking chills and rigors. He felt short of breath, used his nebulizer without any improvement. At arrival he had a high fever, tachycardia. He does not have an elevated lactate, no sign of severe sepsis, but he does have a moderate leukocytosis. Even after treatment for his fever he  had persistent tachycardia. Influenza negative. Chest x-ray did not show obvious pneumonia. Patient began to develop left-sided pleuritic chest pain here in the ER. CT angiography was therefore performed. No evidence of PE, but there is evidence of left lower lobe pneumonia.  Final Clinical Impressions(s) / ED Diagnoses   Final diagnoses:  Pleuritic chest pain  Community acquired pneumonia of left lower lobe of lung ([place])  Sepsis, due to unspecified organism [facility])    New Prescriptions New Prescriptions   No medications on file     Orpah Greek, MD 09/16/16 [phone]

## 2016-09-16 NOTE — ED Triage Notes (Signed)
Pt with fever, shortness of breat, and body aches since 8pm last night. Pt used nebulizer x 2 prior to arrival.

## 2016-09-16 NOTE — Care Management Note (Signed)
Case Management Note  Patient Details  Name: RANDAL GOENS MRN: [id number] Date of Birth: Oct 16, 1940   Expected Discharge Date:       09/19/2016           Expected Discharge Plan:  Home/Self Care  In-House Referral:  NA  Discharge planning Services  CM Consult  Post Acute Care Choice:  NA Choice offered to:  NA  Status of Service:  In process, will continue to follow  Additional Comments: Pt from home with wife, wife at bedside. Pt is ind with ADL's. He has PCP, transportation and insurance with drug coverage. Pt planning to return home with self care at DC. No needs communicated. CM will follow to DC.   Sherald Barge, RN 09/16/2016, 2:22 PM

## 2016-09-17 LAB — GLUCOSE, CAPILLARY: Glucose-Capillary: 97 mg/dL (ref 65–99)

## 2016-09-17 LAB — CBC
HCT: 38 % — ABNORMAL LOW (ref 39.0–52.0)
Hemoglobin: 12.5 g/dL — ABNORMAL LOW (ref 13.0–17.0)
MCH: 29.5 pg (ref 26.0–34.0)
MCHC: 32.9 g/dL (ref 30.0–36.0)
MCV: 89.6 fL (ref 78.0–100.0)
Platelets: 157 10*3/uL (ref 150–400)
RBC: 4.24 MIL/uL (ref 4.22–5.81)
RDW: 14.6 % (ref 11.5–15.5)
WBC: 25.4 10*3/uL — ABNORMAL HIGH (ref 4.0–10.5)

## 2016-09-17 LAB — BASIC METABOLIC PANEL
Anion gap: 7 (ref 5–15)
BUN: 20 mg/dL (ref 6–20)
CO2: 23 mmol/L (ref 22–32)
Calcium: 8.1 mg/dL — ABNORMAL LOW (ref 8.9–10.3)
Chloride: 106 mmol/L (ref 101–111)
Creatinine, Ser: 0.9 mg/dL (ref 0.61–1.24)
GFR calc Af Amer: >60 mL/min (ref >60)
GFR calc non Af Amer: >60 mL/min (ref >60)
Glucose, Bld: 132 mg/dL — ABNORMAL HIGH (ref 65–99)
Potassium: 3.3 mmol/L — ABNORMAL LOW (ref 3.5–5.1)
Sodium: 136 mmol/L (ref 135–145)

## 2016-09-17 LAB — STREP PNEUMONIAE URINARY ANTIGEN: Strep Pneumo Urinary Antigen: NEGATIVE

## 2016-09-17 LAB — LEGIONELLA PNEUMOPHILA SEROGP 1 UR AG: L. pneumophila Serogp 1 Ur Ag: NEGATIVE

## 2016-09-17 LAB — HIV ANTIBODY (ROUTINE TESTING W REFLEX): HIV Screen 4th Generation wRfx: NONREACTIVE

## 2016-09-17 LAB — URINE CULTURE: Culture: NO GROWTH

## 2016-09-17 MED ORDER — GUAIFENESIN ER 600 MG PO TB12: 600.0000 mg | ORAL_TABLET | Freq: Two times a day (BID) | ORAL | 0 refills | Status: DC

## 2016-09-17 MED ORDER — LEVOFLOXACIN 750 MG PO TABS: 750.0000 mg | ORAL_TABLET | Freq: Every day | ORAL | 0 refills | Status: DC

## 2016-09-17 NOTE — Care Management Important Message (Signed)
Important Message  Patient Details  Name: [name] [name] MRN: [id number] Date of Birth: 24-Mar-1941   Medicare Important Message Given:  Yes    Sherald Barge, RN 09/17/2016, 9:21 AM

## 2016-09-17 NOTE — Progress Notes (Signed)
SATURATION QUALIFICATIONS: (This note is used to comply with regulatory documentation for home oxygen)  Patient Saturations on Room Air at Rest = 97%  Patient Saturations on Room Air while Ambulating = 94%  

## 2016-09-17 NOTE — Progress Notes (Signed)
Pt's IV catheter removed and intact. Pt's IV site clean dry and intact. Discharge instructions including medications and follow up appointments were reviewed and discussed with patient. All questions were answered and no further questions at this time. Pt in stable condition and in no acute distress at time of discharge. Pt verbalized understanding of discharge instructions. Pt escorted by nurse tech.

## 2016-09-17 NOTE — Discharge Summary (Signed)
Physician Discharge Summary  [name] [name] ZOX:[id number] DOB: [birth year] DOA: 09/16/2016  PCP: Mickie Hillier, MD  Admit date: 09/16/2016 Discharge date: 09/17/2016  Admitted From: home Disposition:  home  Recommendations for Outpatient Follow-up:  1. Follow up with PCP in 1-2 weeks 2. Please obtain BMP/CBC in one week 3. Chest x-ray in 3-4 weeks to ensure resolution of pneumonia.  Home Health: Equipment/Devices:  Discharge Condition: stable CODE STATUS: full Diet recommendation: Heart Healthy   Brief/Interim Summary: 76 year old male with a history of COPD, admitted with community-acquired pneumonia. Started on Levaquin. He had productive cough, shortness of breath and left-sided pleuritic chest pain. He was having fevers on admission which had resolved at the time of discharge. The following day the patient was feeling significantly better. Reports shortness of breath has resolved. He is not having fevers. Has minimal cough. He is ambulating around without difficulty. He is requesting discharge home. He did have a rising leukocytosis from 16K on admission to 25K, but is clinically improved. He was transitioned to oral antibiotics to complete his course. He will need repeat CBC in one week to ensure resolution of leukocytosis and repeat chest xray in 3-4 weeks to ensure resolution of pneumonia  Discharge Diagnoses:  Active Problems:   Mixed hyperlipidemia   COPD (chronic obstructive pulmonary disease) (HCC)   CAP (community acquired pneumonia)   Pleuritic chest pain   HTN (hypertension)    Discharge Instructions  Discharge Instructions    Diet - low sodium heart healthy    Complete by:  As directed    Increase activity slowly    Complete by:  As directed      Allergies as of 09/17/2016      Reactions   Lasix [furosemide] Rash   Cefzil [cefprozil] Nausea Only   Dexamethasone Swelling   Gabapentin    Legs swell up    Neomycin Other (See Comments)   Patient can't remember.    Tetracyclines & Related Itching   Methocarbamol Swelling, Rash   Penicillins Rash   Has patient had a PCN reaction causing immediate rash, facial/tongue/throat swelling, SOB or lightheadedness with hypotension: yes Has patient had a PCN reaction causing severe rash involving mucus membranes or skin necrosis: unknown Has patient had a PCN reaction that required hospitalization: no Has patient had a PCN reaction occurring within the last 10 years: No If all of the above answers are "NO", then may proceed with Cephalosporin use.      Medication List    TAKE these medications   albuterol 108 (90 Base) MCG/ACT inhaler Commonly known as:  PROVENTIL HFA;VENTOLIN HFA Inhale 1-2 puffs into the lungs every 6 (six) hours as needed for wheezing or shortness of breath.   albuterol (2.5 MG/3ML) 0.083% nebulizer solution Commonly known as:  PROVENTIL INHALE 1 VIAL VIA NEBULIZER FOUR TIMES DAILY.   aspirin EC 81 MG tablet Take 81 mg by mouth daily.   cetirizine 10 MG tablet Commonly known as:  ZYRTEC Take 10 mg by mouth daily.   esomeprazole 20 MG capsule Commonly known as:  NEXIUM Take 20 mg by mouth daily at 12 noon.   fluticasone 220 MCG/ACT inhaler Commonly known as:  FLOVENT HFA Inhale 2 puffs into the lungs daily as needed (shortness of breath).   guaiFENesin 600 MG 12 hr tablet Commonly known as:  MUCINEX Take 1 tablet (600 mg total) by mouth 2 (two) times daily.   ibuprofen 200 MG tablet Commonly known as:  ADVIL,MOTRIN Take 800 mg by mouth  daily as needed for moderate pain.   levofloxacin 750 MG tablet Commonly known as:  LEVAQUIN Take 1 tablet (750 mg total) by mouth daily.   losartan 25 MG tablet Commonly known as:  COZAAR Take 1 tablet (25 mg total) by mouth daily.   lovastatin 20 MG tablet Commonly known as:  MEVACOR Take 20 mg by mouth at bedtime.   montelukast 10 MG tablet Commonly known as:  SINGULAIR TAKE ONE TABLET BY MOUTH AT BEDTIME AS NEEDED FOR  ALLERGIES.   multivitamin with minerals Tabs tablet Take 1 tablet by mouth daily.   nitroGLYCERIN 0.4 MG SL tablet Commonly known as:  NITROSTAT Place 1 tablet (0.4 mg total) under the tongue every 5 (five) minutes as needed.   pseudoephedrine-guaifenesin 60-600 MG 12 hr tablet Commonly known as:  MUCINEX D Take 1 tablet by mouth daily as needed for congestion.   RESTASIS 0.05 % ophthalmic emulsion Generic drug:  cycloSPORINE Place 1 drop into both eyes 2 (two) times daily.   SULFACETAMIDE SODIUM-SULFUR EX Apply 1 application topically once.       Allergies  Allergen Reactions  . Lasix [Furosemide] Rash  . Cefzil [Cefprozil] Nausea Only  . Dexamethasone Swelling  . Gabapentin     Legs swell up   . Neomycin Other (See Comments)    Patient can't remember.  . Tetracyclines & Related Itching  . Methocarbamol Swelling and Rash  . Penicillins Rash    Has patient had a PCN reaction causing immediate rash, facial/tongue/throat swelling, SOB or lightheadedness with hypotension: yes Has patient had a PCN reaction causing severe rash involving mucus membranes or skin necrosis: unknown Has patient had a PCN reaction that required hospitalization: no Has patient had a PCN reaction occurring within the last 10 years: No If all of the above answers are "NO", then may proceed with Cephalosporin use.     Consultations:     Procedures/Studies: Dg Chest 2 View  Result Date: 09/16/2016 CLINICAL DATA:  Dyspnea since 20:00.  Fever. EXAM: CHEST  2 VIEW COMPARISON:  10/30/2014 FINDINGS: Stable mild left hemidiaphragm elevation. No confluent airspace consolidation. New mild interstitial thickening or fluid. New mild vascular prominence. IMPRESSION: Vascular and interstitial prominence may represent mild congestive heart failure. No alveolar edema or airspace consolidation. No effusion. Electronically Signed   By: Andreas Newport M.D.   On: 09/16/2016 03:39   Ct Angio Chest Pe W Or Wo  Contrast  Result Date: 09/16/2016 CLINICAL DATA:  Fever and dyspnea since 20:00 last night EXAM: CT ANGIOGRAPHY CHEST WITH CONTRAST TECHNIQUE: Multidetector CT imaging of the chest was performed using the standard protocol during bolus administration of intravenous contrast. Multiplanar CT image reconstructions and MIPs were obtained to evaluate the vascular anatomy. CONTRAST:  100 mL Isovue 370 intravenous COMPARISON:  Radiographs 09/16/2016 FINDINGS: Cardiovascular: Satisfactory opacification of the pulmonary arteries to the segmental level. No evidence of pulmonary embolism. Normal heart size. No pericardial effusion. Mediastinum/Nodes: No enlarged mediastinal, hilar, or axillary lymph nodes. Thyroid gland, trachea, and esophagus demonstrate no significant findings. Lungs/Pleura: Consolidation in the left lower lobe superior segment. This may represent pneumonia. No pleural effusion. Right lung is clear. Upper Abdomen: No acute abnormality. Musculoskeletal: No significant skeletal lesion. Review of the MIP images confirms the above findings. IMPRESSION: 1. Negative for acute pulmonary embolism. 2. Superior segment left lower lobe focal consolidation. This may represent pneumonia. Followup PA and lateral chest X-ray is recommended in 3-4 weeks following trial of antibiotic therapy to ensure resolution and exclude  underlying malignancy. Electronically Signed   By: Andreas Newport M.D.   On: 09/16/2016 06:03   Nm Myocar Multi W/spect W/wall Motion / Ef  Result Date: 08/31/2016  No diagnostic ST segment changes to indicate ischemia. Frequent PACs noted throughout.  Small, mild intensity, mid to apical inferior defect that is partially reversible suggesting scar with minor peri-infarct ischemia apically.  This is a low risk study.  Nuclear stress EF: 56%.        Subjective: feeling better. Shortness of breath is improved. Minimal cough. Ambulating without difficulty. Wants to go home.  Discharge  Exam: Vitals:   09/16/16 2336 09/17/16 0530  BP: 93/62 108/65  Pulse: 87 94  Resp: 16 15  Temp: 98.2 F (36.8 C) 98.3 F (36.8 C)   Vitals:   09/16/16 2336 09/17/16 0221 09/17/16 0530 09/17/16 0928  BP: 93/62  108/65   Pulse: 87  94   Resp: 16  15   Temp: 98.2 F (36.8 C)  98.3 F (36.8 C)   TempSrc: Oral  Oral   SpO2: 97% 96% 96% 94%  Weight:      Height:        General: Pt is alert, awake, not in acute distress Cardiovascular: RRR, S1/S2 +, no rubs, no gallops Respiratory: CTA bilaterally, no wheezing, no rhonchi Abdominal: Soft, NT, ND, bowel sounds + Extremities: no edema, no cyanosis    The results of significant diagnostics from this hospitalization (including imaging, microbiology, ancillary and laboratory) are listed below for reference.     Microbiology: Recent Results (from the past 240 hour(s))  Culture, blood (Routine X 2) w Reflex to ID Panel     Status: None (Preliminary result)   Collection Time: 09/16/16  2:36 AM  Result Value Ref Range Status   Specimen Description LEFT ANTECUBITAL  Final   Special Requests   Final    BOTTLES DRAWN AEROBIC AND ANAEROBIC Blood Culture adequate volume DRAWN BY RN   Culture NO GROWTH 1 DAY  Final   Report Status PENDING  Incomplete  Urine culture     Status: None   Collection Time: 09/16/16  2:42 AM  Result Value Ref Range Status   Specimen Description URINE, CLEAN CATCH  Final   Special Requests NONE  Final   Culture   Final    NO GROWTH Performed at [facility], [place] [address] [place], [place] [address]    Report Status 09/17/2016 FINAL  Final  Culture, blood (Routine X 2) w Reflex to ID Panel     Status: None (Preliminary result)   Collection Time: 09/16/16  2:44 AM  Result Value Ref Range Status   Specimen Description RIGHT ANTECUBITAL  Final   Special Requests   Final    BOTTLES DRAWN AEROBIC AND ANAEROBIC Blood Culture adequate volume   Culture NO GROWTH 1 DAY  Final   Report Status PENDING   Incomplete     Labs: BNP (last 3 results)  Recent Labs  09/16/16 0235  BNP 01.7   Basic Metabolic Panel:  Recent Labs Lab 09/16/16 0235 09/17/16 0556  NA 141 136  K 3.9 3.3*  CL 106 106  CO2 28 23  GLUCOSE 121* 132*  BUN 22* 20  CREATININE 0.93 0.90  CALCIUM 9.2 8.1*   Liver Function Tests:  Recent Labs Lab 09/16/16 0235  AST 28  ALT 23  ALKPHOS 62  BILITOT 0.9  PROT 7.4  ALBUMIN 4.0   No results for input(s): LIPASE, AMYLASE  in the last 168 hours. No results for input(s): AMMONIA in the last 168 hours. CBC:  Recent Labs Lab 09/16/16 0235 09/17/16 0556  WBC 16.6* 25.4*  NEUTROABS 15.0*  --   HGB 14.0 12.5*  HCT 42.4 38.0*  MCV 89.6 89.6  PLT 179 157   Cardiac Enzymes: No results for input(s): CKTOTAL, CKMB, CKMBINDEX, TROPONINI in the last 168 hours. BNP: Invalid input(s): POCBNP CBG:  Recent Labs Lab 09/16/16 2115 09/17/16 0159  GLUCAP 145* 97   D-Dimer No results for input(s): DDIMER in the last 72 hours. Hgb A1c No results for input(s): HGBA1C in the last 72 hours. Lipid Profile No results for input(s): CHOL, HDL, LDLCALC, TRIG, CHOLHDL, LDLDIRECT in the last 72 hours. Thyroid function studies No results for input(s): TSH, T4TOTAL, T3FREE, THYROIDAB in the last 72 hours.  Invalid input(s): FREET3 Anemia work up No results for input(s): VITAMINB12, FOLATE, FERRITIN, TIBC, IRON, RETICCTPCT in the last 72 hours. Urinalysis    Component Value Date/Time   COLORURINE YELLOW 09/16/2016 0242   APPEARANCEUR CLEAR 09/16/2016 0242   LABSPEC 1.023 09/16/2016 0242   PHURINE 5.0 09/16/2016 0242   GLUCOSEU NEGATIVE 09/16/2016 0242   HGBUR NEGATIVE 09/16/2016 0242   BILIRUBINUR NEGATIVE 09/16/2016 0242   BILIRUBINUR ++ 03/05/2016 1544   KETONESUR NEGATIVE 09/16/2016 0242   PROTEINUR NEGATIVE 09/16/2016 0242   UROBILINOGEN 2.0 03/05/2016 1544   UROBILINOGEN 0.2 12/04/2014 0240   NITRITE NEGATIVE 09/16/2016 0242   LEUKOCYTESUR NEGATIVE  09/16/2016 0242   Sepsis Labs Invalid input(s): PROCALCITONIN,  WBC,  LACTICIDVEN Microbiology Recent Results (from the past 240 hour(s))  Culture, blood (Routine X 2) w Reflex to ID Panel     Status: None (Preliminary result)   Collection Time: 09/16/16  2:36 AM  Result Value Ref Range Status   Specimen Description LEFT ANTECUBITAL  Final   Special Requests   Final    BOTTLES DRAWN AEROBIC AND ANAEROBIC Blood Culture adequate volume DRAWN BY RN   Culture NO GROWTH 1 DAY  Final   Report Status PENDING  Incomplete  Urine culture     Status: None   Collection Time: 09/16/16  2:42 AM  Result Value Ref Range Status   Specimen Description URINE, CLEAN CATCH  Final   Special Requests NONE  Final   Culture   Final    NO GROWTH Performed at [facility], [place] [address], [place],  [address]    Report Status 09/17/2016 FINAL  Final  Culture, blood (Routine X 2) w Reflex to ID Panel     Status: None (Preliminary result)   Collection Time: 09/16/16  2:44 AM  Result Value Ref Range Status   Specimen Description RIGHT ANTECUBITAL  Final   Special Requests   Final    BOTTLES DRAWN AEROBIC AND ANAEROBIC Blood Culture adequate volume   Culture NO GROWTH 1 DAY  Final   Report Status PENDING  Incomplete     Time coordinating discharge: Over 30 minutes  SIGNED:   Kathie Dike, MD  [facility] 09/17/2016, 12:07 PM Pager   If 7PM-7AM, please contact night-coverage [email or web address] Password [email or web address]

## 2016-09-20 ENCOUNTER — Other Ambulatory Visit: Payer: Self-pay | Admitting: Family Medicine

## 2016-09-21 LAB — CULTURE, BLOOD (ROUTINE X 2)
Culture: NO GROWTH
Culture: NO GROWTH
Special Requests: ADEQUATE

## 2016-09-22 ENCOUNTER — Telehealth: Payer: Self-pay | Admitting: *Deleted

## 2016-09-22 NOTE — Telephone Encounter (Signed)
Patient has follow up office visit for hospital follow up on 09/24/16

## 2016-09-24 ENCOUNTER — Encounter: Payer: Self-pay | Admitting: Family Medicine

## 2016-09-24 ENCOUNTER — Ambulatory Visit (INDEPENDENT_AMBULATORY_CARE_PROVIDER_SITE_OTHER): Payer: Medicare Other | Admitting: Family Medicine

## 2016-09-24 VITALS — BP 132/80 | Temp 98.1°F | Ht 73.0 in | Wt 212.0 lb

## 2016-09-24 DIAGNOSIS — Z23 Encounter for immunization: Secondary | ICD-10-CM | POA: Diagnosis not present

## 2016-09-24 DIAGNOSIS — I2510 Atherosclerotic heart disease of native coronary artery without angina pectoris: Secondary | ICD-10-CM

## 2016-09-24 DIAGNOSIS — D72829 Elevated white blood cell count, unspecified: Secondary | ICD-10-CM | POA: Diagnosis not present

## 2016-09-24 DIAGNOSIS — J181 Lobar pneumonia, unspecified organism: Secondary | ICD-10-CM | POA: Diagnosis not present

## 2016-09-24 DIAGNOSIS — J189 Pneumonia, unspecified organism: Secondary | ICD-10-CM

## 2016-09-24 NOTE — Progress Notes (Signed)
   Subjective:    Patient ID: [name] [name], male    DOB: 01/19/1941, 76 y.o.   MRN: [id number]  HPIFollow up pneumonia.  This patient states he is actually feeling better. Denies high fever chills wheezing he does state low but a cough gets short winded if he pushes himself too much he did have a scan while in the hospital which showed pneumonia. He also had lab work that showed abnormalities in the kidney function as well as white blood count Wants to make sure he is up to date on pneumonia vaccines.   Review of Systems  Constitutional: Negative for fatigue and fever.  HENT: Negative for ear pain and rhinorrhea.   Respiratory: Positive for cough and shortness of breath.   Cardiovascular: Negative for chest pain.  Gastrointestinal: Negative for abdominal pain.       Objective:   Physical Exam  Constitutional: He appears well-developed.  HENT:  Head: Normocephalic.  Mouth/Throat: Oropharynx is clear and moist. No oropharyngeal exudate.  Neck: Normal range of motion.  Cardiovascular: Normal rate, regular rhythm and normal heart sounds.   No murmur heard. Pulmonary/Chest: Effort normal and breath sounds normal. He has no wheezes.  Lymphadenopathy:    He has no cervical adenopathy.  Neurological: He exhibits normal muscle tone.  Skin: Skin is warm and dry.  Nursing note and vitals reviewed.  We will discuss with radiology. I called them after long span of time on the phone no answer         Assessment & Plan:  1. Community acquired pneumonia of left lower lobe of lung [facility]) Patient is finish up antibiotics. Is doing well. We will do a follow-up x-ray several weeks. We will discuss with radiology if they recommend follow-up x-ray or follow-up CAT scan  2. Leukocytosis, unspecified type Follow-up CBC. I believe it should be fine now  3. Hypocalcemia Follow-up metabolic 7 await the results patient was advised to avoid pseudoephedrine

## 2016-09-25 LAB — CBC WITH DIFFERENTIAL/PLATELET
Basophils Absolute: 0.1 10*3/uL (ref 0.0–0.2)
Basos: 1 %
EOS (ABSOLUTE): 1 10*3/uL — ABNORMAL HIGH (ref 0.0–0.4)
Eos: 8 %
Hematocrit: 40.6 % (ref 37.5–51.0)
Hemoglobin: 13.2 g/dL (ref 13.0–17.7)
Immature Grans (Abs): 0.1 10*3/uL (ref 0.0–0.1)
Immature Granulocytes: 1 %
Lymphocytes Absolute: 2.1 10*3/uL (ref 0.7–3.1)
Lymphs: 19 %
MCH: 28.4 pg (ref 26.6–33.0)
MCHC: 32.5 g/dL (ref 31.5–35.7)
MCV: 88 fL (ref 79–97)
Monocytes Absolute: 0.8 10*3/uL (ref 0.1–0.9)
Monocytes: 7 %
Neutrophils Absolute: 7.4 10*3/uL — ABNORMAL HIGH (ref 1.4–7.0)
Neutrophils: 64 %
Platelets: 264 10*3/uL (ref 150–379)
RBC: 4.64 x10E6/uL (ref 4.14–5.80)
RDW: 14.4 % (ref 12.3–15.4)
WBC: 11.4 10*3/uL — ABNORMAL HIGH (ref 3.4–10.8)

## 2016-09-25 LAB — BASIC METABOLIC PANEL
BUN/Creatinine Ratio: 14 (ref 10–24)
BUN: 12 mg/dL (ref 8–27)
CO2: 28 mmol/L (ref 18–29)
Calcium: 9.4 mg/dL (ref 8.6–10.2)
Chloride: 98 mmol/L (ref 96–106)
Creatinine, Ser: 0.88 mg/dL (ref 0.76–1.27)
GFR calc Af Amer: 97 mL/min/{1.73_m2} (ref >59)
GFR calc non Af Amer: 84 mL/min/{1.73_m2} (ref >59)
Glucose: 96 mg/dL (ref 65–99)
Potassium: 4.9 mmol/L (ref 3.5–5.2)
Sodium: 139 mmol/L (ref 134–144)

## 2016-10-01 ENCOUNTER — Telehealth: Payer: Self-pay | Admitting: Family Medicine

## 2016-10-01 ENCOUNTER — Other Ambulatory Visit: Payer: Self-pay | Admitting: *Deleted

## 2016-10-01 DIAGNOSIS — J189 Pneumonia, unspecified organism: Secondary | ICD-10-CM

## 2016-10-01 DIAGNOSIS — Z79899 Other long term (current) drug therapy: Secondary | ICD-10-CM

## 2016-10-01 NOTE — Telephone Encounter (Signed)
Patient notified CT scan with contrast scheduled July 6th 8am register 7:45 at [facility]. Pt also  Needs to have bloodwork (BMP) before the scan. Need to have within 6 weeks of scan. Orders put in for pt to do. Patient  Verbalized understanding.

## 2016-10-01 NOTE — Telephone Encounter (Signed)
Left message to return call. Pt's ct scan with contrast scheduled July 6th 8am register 7:45 at [facility]. Pt also  Needs to have bloodwork (BMP) before the scan. Need to have within 6 weeks of scan. Orders put in for pt to do.

## 2016-10-01 NOTE — Telephone Encounter (Signed)
Spoke with patient and advised Dr. Nicki Reaper discussed the case with radiology. They looked at his x-ray they also looked at his CAT scan. Radiologist stated that since the pneumonia was not seen on x-ray but was seen on CAT scan in order to ensure that it has totally opened up and that there is not an underlying malignancy they recommend a repeat CT scan with contrast of the chest 8 weeks after the previous x-ray/scan so that would be at the start of July. Also, patient was advised that he will need a follow-up office visit with Dr. Richardson Landry the first couple weeks of July. The patient voiced understanding.   He was advised that we will be calling him back with CT scan date and time once the order is placed.

## 2016-10-01 NOTE — Telephone Encounter (Signed)
I discussed the case with radiology. They looked at his x-ray they also looked at his CAT scan. Radiologist stated that since the pneumonia was not seen on x-ray but was seen on CAT scan in order to ensure that it has totally opened up and that there is not an underlying malignancy they recommend a repeat CT scan with contrast of the chest 8 weeks after the previous x-ray/scan so that would be at the start of July. The patient was aware that I would be speaking with radiology. Let the patient be aware of this information. Please set up the CAT scan. The patient will need a follow-up office visit with Dr. Annie Main the first couple weeks of July

## 2016-10-12 ENCOUNTER — Ambulatory Visit (INDEPENDENT_AMBULATORY_CARE_PROVIDER_SITE_OTHER): Payer: Medicare Other | Admitting: Family Medicine

## 2016-10-12 ENCOUNTER — Encounter: Payer: Self-pay | Admitting: Family Medicine

## 2016-10-12 VITALS — BP 128/86 | Temp 97.9°F | Ht 73.0 in | Wt 212.0 lb

## 2016-10-12 DIAGNOSIS — I2510 Atherosclerotic heart disease of native coronary artery without angina pectoris: Secondary | ICD-10-CM | POA: Diagnosis not present

## 2016-10-12 DIAGNOSIS — J029 Acute pharyngitis, unspecified: Secondary | ICD-10-CM

## 2016-10-12 MED ORDER — MAGIC MOUTHWASH: 15.0000 mL | Freq: Four times a day (QID) | ORAL | 0 refills | Status: DC

## 2016-10-12 MED ORDER — FLUCONAZOLE 200 MG PO TABS: 200.0000 mg | ORAL_TABLET | Freq: Every day | ORAL | 0 refills | Status: DC

## 2016-10-12 NOTE — Progress Notes (Signed)
   Subjective:    Patient ID: [name] [name], male    DOB: 1940/06/30, 76 y.o.   MRN: [id number]  Sore Throat   This is a new problem. The current episode started 1 to 4 weeks ago. Associated symptoms include congestion and coughing. Associated symptoms comments: wheezing. Treatments tried: high power iv antibiotics for pneumonia.   Patient states he was in hospital for pneumonia recently and not sure if he has thrush due to high power antibiotics or if new illness  Persistent cough and cong  Using albuterol nearly daily  Tongue burning and throat painful and irritated  Sees white patches intermitently   Gargling twice per day warm water salt and vinegar and aspirin  All this followed pneumonia   Also patient has follow-up scan scheduled soon. Will follow on this to ascertain clearance of pneumonia and infiltrate  Review of Systems  HENT: Positive for congestion.   Respiratory: Positive for cough.        Objective:   Physical Exam Alert vital signs stable talkative no acute distress lungs clear heart regular rhythm. Pharynx erythematous       Assessment & Plan:  Impression pharyngeal upper esophageal symptoms. Patient worried about thrush. Has had in the past recent antibiotics plan will cover with Diflucan and Magic mouthwash symptom care discussed

## 2016-10-25 ENCOUNTER — Other Ambulatory Visit: Payer: Self-pay | Admitting: *Deleted

## 2016-10-25 ENCOUNTER — Telehealth: Payer: Self-pay | Admitting: Family Medicine

## 2016-10-25 ENCOUNTER — Other Ambulatory Visit: Payer: Self-pay | Admitting: Family Medicine

## 2016-10-25 MED ORDER — MAGIC MOUTHWASH: 15.0000 mL | Freq: Four times a day (QID) | ORAL | 0 refills | Status: DC

## 2016-10-25 NOTE — Telephone Encounter (Signed)
Ok may ref 

## 2016-10-25 NOTE — Telephone Encounter (Signed)
Patient is requesting a refill on his [facility] Magic Mouthwash to [facility].

## 2016-10-26 DIAGNOSIS — K148 Other diseases of tongue: Secondary | ICD-10-CM | POA: Diagnosis not present

## 2016-10-26 NOTE — Telephone Encounter (Signed)
Refill sent to pharm. Pt notified  

## 2016-11-07 ENCOUNTER — Other Ambulatory Visit: Payer: Self-pay | Admitting: Cardiovascular Disease

## 2016-11-08 DIAGNOSIS — Z79899 Other long term (current) drug therapy: Secondary | ICD-10-CM | POA: Diagnosis not present

## 2016-11-09 LAB — BASIC METABOLIC PANEL
BUN/Creatinine Ratio: 9 — ABNORMAL LOW (ref 10–24)
BUN: 9 mg/dL (ref 8–27)
CO2: 27 mmol/L (ref 20–29)
Calcium: 9.3 mg/dL (ref 8.6–10.2)
Chloride: 101 mmol/L (ref 96–106)
Creatinine, Ser: 0.95 mg/dL (ref 0.76–1.27)
GFR calc Af Amer: 90 mL/min/{1.73_m2} (ref >59)
GFR calc non Af Amer: 78 mL/min/{1.73_m2} (ref >59)
Glucose: 94 mg/dL (ref 65–99)
Potassium: 4.3 mmol/L (ref 3.5–5.2)
Sodium: 142 mmol/L (ref 134–144)

## 2016-11-19 ENCOUNTER — Ambulatory Visit (HOSPITAL_COMMUNITY)
Admission: RE | Admit: 2016-11-19 | Discharge: 2016-11-19 | Disposition: A | Discharge status: Home / Self Care | Payer: Medicare Other | Source: Ambulatory Visit | Attending: Family Medicine | Admitting: Family Medicine

## 2016-11-19 DIAGNOSIS — Z9049 Acquired absence of other specified parts of digestive tract: Secondary | ICD-10-CM | POA: Insufficient documentation

## 2016-11-19 DIAGNOSIS — I2510 Atherosclerotic heart disease of native coronary artery without angina pectoris: Secondary | ICD-10-CM | POA: Insufficient documentation

## 2016-11-19 DIAGNOSIS — J189 Pneumonia, unspecified organism: Secondary | ICD-10-CM | POA: Insufficient documentation

## 2016-11-19 DIAGNOSIS — I700 Atherosclerosis of aorta: Secondary | ICD-10-CM | POA: Diagnosis not present

## 2016-11-19 MED ORDER — IOPAMIDOL (ISOVUE-300) INJECTION 61%
75.0000 mL | Freq: Once | INTRAVENOUS | Status: AC | PRN
  Administered 2016-11-19: 75 mL via INTRAVENOUS

## 2016-11-26 ENCOUNTER — Ambulatory Visit (INDEPENDENT_AMBULATORY_CARE_PROVIDER_SITE_OTHER): Payer: Medicare Other | Admitting: Family Medicine

## 2016-11-26 ENCOUNTER — Encounter: Payer: Self-pay | Admitting: Family Medicine

## 2016-11-26 VITALS — BP 128/70 | Ht 73.0 in | Wt 210.0 lb

## 2016-11-26 DIAGNOSIS — J189 Pneumonia, unspecified organism: Secondary | ICD-10-CM

## 2016-11-26 DIAGNOSIS — D72829 Elevated white blood cell count, unspecified: Secondary | ICD-10-CM | POA: Diagnosis not present

## 2016-11-26 DIAGNOSIS — I2510 Atherosclerotic heart disease of native coronary artery without angina pectoris: Secondary | ICD-10-CM | POA: Diagnosis not present

## 2016-11-26 DIAGNOSIS — J181 Lobar pneumonia, unspecified organism: Secondary | ICD-10-CM | POA: Diagnosis not present

## 2016-11-26 DIAGNOSIS — J4520 Mild intermittent asthma, uncomplicated: Secondary | ICD-10-CM | POA: Diagnosis not present

## 2016-11-26 NOTE — Progress Notes (Signed)
   Subjective:    Patient ID: [name] [name], male    DOB: 1941-02-12, 76 y.o.   MRN: [id number]  HPIFollow up blood work results and Ct scan.   Patient comes in with numerous concerns regarding his recent blood work.  Seems to be very concerned that there is atherosclerosis present on a CT scan. I pointed out that this is what is occurring with his known coronary artery disease which she's had for years. Patient seems to express surprise regarding  Patient goes into great history regarding his recent symptomatology which led to blood work and scans.  Patient was scanned for follow-up of community-acquired pneumonia. Reports some residual cough and wheeziness.  Original scan raise concern with recommendations for follow-up. Follow-up shows that the pneumonia was nearly resolved. Patient is worried about the word "nearly". I asked him to try not to worry about this.  Patient also numerous questions about vaccines for pneumonia and these were answered patient today        Review of Systems No headache, no major weight loss or weight gain, no chest pain no back pain abdominal pain no change in bowel habits complete ROS otherwise negative     Objective:   Physical Exam Alert and oriented, vitals reviewed and stable, NAD ENT-TM's and ext canals WNL bilat via otoscopic exam Soft palate, tonsils and post pharynx WNL via oropharyngeal exam Neck-symmetric, no masses; thyroid nonpalpable and nontender Pulmonary-no tachypnea or accessory muscle use; Mild wheezes via auscultation no tachypnea Card--no abnrml murmurs, rhythm reg and rate WNL Carotid pulses symmetric, without bruits        Assessment & Plan:  Impression 1 status post pneumonia clinically resolved discussed #2 flare up of reactive airways after recent pneumonia. Within normal limits discussed importance of using steroid inhaler discussed an underlying. #3 atherosclerotic disease discussion held regarding education of what  this is next  Greater than 50% of this 25 minute face to face visit was spent in counseling and discussion and coordination of care regarding the above diagnosis/diagnosies. Follow-up in 6 months for wellness plus chronic

## 2016-11-28 ENCOUNTER — Other Ambulatory Visit: Payer: Self-pay | Admitting: Family Medicine

## 2016-12-02 DIAGNOSIS — H01025 Squamous blepharitis left lower eyelid: Secondary | ICD-10-CM | POA: Diagnosis not present

## 2016-12-02 DIAGNOSIS — H31092 Other chorioretinal scars, left eye: Secondary | ICD-10-CM | POA: Diagnosis not present

## 2016-12-02 DIAGNOSIS — H40013 Open angle with borderline findings, low risk, bilateral: Secondary | ICD-10-CM | POA: Diagnosis not present

## 2016-12-02 DIAGNOSIS — H01021 Squamous blepharitis right upper eyelid: Secondary | ICD-10-CM | POA: Diagnosis not present

## 2016-12-02 DIAGNOSIS — L719 Rosacea, unspecified: Secondary | ICD-10-CM | POA: Diagnosis not present

## 2016-12-02 DIAGNOSIS — Z961 Presence of intraocular lens: Secondary | ICD-10-CM | POA: Diagnosis not present

## 2016-12-02 DIAGNOSIS — L718 Other rosacea: Secondary | ICD-10-CM | POA: Diagnosis not present

## 2016-12-02 DIAGNOSIS — H33053 Total retinal detachment, bilateral: Secondary | ICD-10-CM | POA: Diagnosis not present

## 2016-12-02 DIAGNOSIS — H01024 Squamous blepharitis left upper eyelid: Secondary | ICD-10-CM | POA: Diagnosis not present

## 2016-12-02 DIAGNOSIS — H04123 Dry eye syndrome of bilateral lacrimal glands: Secondary | ICD-10-CM | POA: Diagnosis not present

## 2016-12-02 DIAGNOSIS — H01022 Squamous blepharitis right lower eyelid: Secondary | ICD-10-CM | POA: Diagnosis not present

## 2016-12-09 ENCOUNTER — Other Ambulatory Visit: Payer: Self-pay | Admitting: Family Medicine

## 2016-12-22 ENCOUNTER — Ambulatory Visit (INDEPENDENT_AMBULATORY_CARE_PROVIDER_SITE_OTHER): Payer: Medicare Other | Admitting: Family Medicine

## 2016-12-22 ENCOUNTER — Encounter: Payer: Self-pay | Admitting: Family Medicine

## 2016-12-22 VITALS — BP 114/80 | Temp 97.8°F | Ht 73.0 in | Wt 214.0 lb

## 2016-12-22 DIAGNOSIS — J440 Chronic obstructive pulmonary disease with acute lower respiratory infection: Secondary | ICD-10-CM | POA: Diagnosis not present

## 2016-12-22 DIAGNOSIS — I2510 Atherosclerotic heart disease of native coronary artery without angina pectoris: Secondary | ICD-10-CM | POA: Diagnosis not present

## 2016-12-22 DIAGNOSIS — J209 Acute bronchitis, unspecified: Secondary | ICD-10-CM

## 2016-12-22 MED ORDER — SULFAMETHOXAZOLE-TRIMETHOPRIM 800-160 MG PO TABS: 1.0000 | ORAL_TABLET | Freq: Two times a day (BID) | ORAL | 0 refills | Status: DC

## 2016-12-22 NOTE — Progress Notes (Signed)
   Subjective:    Patient ID: [name] [name], male    DOB: Sep 15, 1940, 76 y.o.   MRN: [id number]  Cough   Patient is here today reports he has had some right side chest pain when inhales. States he has a non productive cough. Breaking out in sweats/ hot flashes.This has been going on for the last 7- 10 days. He has been treating it with Advil, mucinex and Nyquil. Has helped some.  Has had a cough off and on  Chest pain sharp in nature  Comes and goes as far as chills and sweating spells  No exertional pain no nausea or diaphoresis    Review of Systems  Respiratory: Positive for cough.        Objective:   Physical Exam  Alert anxious appearing. Mild nasal congestion pharynx normal lungs rare wheeze otherwise clear. Heart rare rhythm abdomen soft no chest wall pain      Assessment & Plan:  Impression probable bronchitis with chest wall inflammation. Likely exacerbation of COPD also Doubt she pneumonia antibiotics prescribed symptom care discussed

## 2016-12-23 ENCOUNTER — Other Ambulatory Visit: Payer: Self-pay | Admitting: Family Medicine

## 2016-12-25 ENCOUNTER — Emergency Department (HOSPITAL_COMMUNITY)
Admission: EM | Admit: 2016-12-25 | Discharge: 2016-12-25 | Disposition: A | Discharge status: Home / Self Care | Payer: Medicare Other | Attending: Emergency Medicine | Admitting: Emergency Medicine

## 2016-12-25 ENCOUNTER — Encounter (HOSPITAL_COMMUNITY): Payer: Self-pay | Admitting: Emergency Medicine

## 2016-12-25 DIAGNOSIS — Z87891 Personal history of nicotine dependence: Secondary | ICD-10-CM | POA: Diagnosis not present

## 2016-12-25 DIAGNOSIS — R040 Epistaxis: Secondary | ICD-10-CM

## 2016-12-25 DIAGNOSIS — Z85828 Personal history of other malignant neoplasm of skin: Secondary | ICD-10-CM | POA: Diagnosis not present

## 2016-12-25 DIAGNOSIS — J45909 Unspecified asthma, uncomplicated: Secondary | ICD-10-CM | POA: Insufficient documentation

## 2016-12-25 DIAGNOSIS — J449 Chronic obstructive pulmonary disease, unspecified: Secondary | ICD-10-CM | POA: Diagnosis not present

## 2016-12-25 DIAGNOSIS — I10 Essential (primary) hypertension: Secondary | ICD-10-CM | POA: Diagnosis not present

## 2016-12-25 DIAGNOSIS — Z79899 Other long term (current) drug therapy: Secondary | ICD-10-CM | POA: Diagnosis not present

## 2016-12-25 DIAGNOSIS — I2510 Atherosclerotic heart disease of native coronary artery without angina pectoris: Secondary | ICD-10-CM | POA: Insufficient documentation

## 2016-12-25 DIAGNOSIS — Z7982 Long term (current) use of aspirin: Secondary | ICD-10-CM | POA: Insufficient documentation

## 2016-12-25 MED ORDER — OXYMETAZOLINE HCL 0.05 % NA SOLN
1.0000 | Freq: Two times a day (BID) | NASAL | Status: DC
  Administered 2016-12-25: 1 via NASAL
  Filled 2016-12-25: qty 15

## 2016-12-25 MED ORDER — TRANEXAMIC ACID 1000 MG/10ML IV SOLN
500.0000 mg | Freq: Once | INTRAVENOUS | Status: AC
  Administered 2016-12-25: 500 mg via TOPICAL
  Filled 2016-12-25: qty 10

## 2016-12-25 MED ORDER — SILVER NITRATE-POT NITRATE 75-25 % EX MISC
CUTANEOUS | Status: AC
  Administered 2016-12-25: 1 via TOPICAL
  Filled 2016-12-25: qty 1

## 2016-12-25 MED ORDER — SILVER NITRATE-POT NITRATE 75-25 % EX MISC
1.0000 | Freq: Once | CUTANEOUS | Status: AC
  Administered 2016-12-25: 1 via TOPICAL
  Filled 2016-12-25: qty 1

## 2016-12-25 NOTE — Discharge Instructions (Signed)
Follow up with Dr Benjamine Mola.  For oozing hold pressure and wipe with cloth.  For significant bleeding return to ER or call ENT office.  Hold aspirin for 2 days.

## 2016-12-25 NOTE — ED Provider Notes (Signed)
[facility] DEPT Provider Note   CSN: [id number] Arrival date & time: 12/25/16  0654     History   Chief Complaint Chief Complaint  Patient presents with  . Epistaxis    HPI [name] [name] is a 76 y.o. male.  Patient with history of abdominal aneurysm, coronary artery disease on aspirin daily, COPD presents with epistaxis. Patient woke up this morning at 2:00 with nosebleed from both nares. Patient's had this in the past and had a CT done and cautery by Dr. Melene Plan.  Patient has been unable to get bleeding to stop despite pressure. Patient is on aspirin and has noticed his bleeding occurs when he is on medicines for respiratory infections for which he started decongestants/albuterol/antibiotics this week.      Past Medical History:  Diagnosis Date  . AAA (abdominal aortic aneurysm) ([place])    needs yearly ultrasound  . Allergy   . Anemia   . Arthritis   . Asthma   . BPH (benign prostatic hyperplasia)   . CAD (coronary artery disease)   . Cancer ([place])    skin cancer  . COPD (chronic obstructive pulmonary disease) ([place])   . Dysrhythmia    pt. states it can be fast at times  . GERD (gastroesophageal reflux disease)   . Glaucoma   . HOH (hard of hearing)   . Hypercholesterolemia   . Hypertension   . Impaired fasting glucose   . Low back pain   . MI (myocardial infarction) ([place]) 1999  . Thrush     Patient Active Problem List   Diagnosis Date Noted  . CAP (community acquired pneumonia) 09/16/2016  . Pleuritic chest pain 09/16/2016  . HTN (hypertension) 09/16/2016  . Herpes zoster without complication 17/00/1749  . Oral candidiasis 04/01/2016  . Prostate hypertrophy 03/06/2016  . Depression 04/06/2015  . Ulnar neuropathy 12/14/2014  . Leg swelling 12/05/2014  . Neuropathic pain 12/05/2014  . S/P spinal surgery 12/05/2014  . Venous stasis dermatitis 12/05/2014  . Insomnia 12/05/2014  . Generalized pain   . UTI (lower urinary tract infection) 11/11/2014  .  Thrush of mouth and esophagus ([place]) 11/11/2014  . Urinary retention   . Urinary tract infectious disease   . Lumbar spondylosis 11/05/2014  . Colon neoplasm 06/11/2013  . GERD (gastroesophageal reflux disease) 01/19/2012  . PAC (premature atrial contraction) 09/15/2010  . ELEVATED BP READING WITHOUT DX HYPERTENSION 05/29/2009  . Mixed hyperlipidemia 04/15/2008  . BEN HTN HEART DISEASE WITHOUT HEART FAIL 04/15/2008  . Coronary atherosclerosis 04/15/2008  . Abdominal aortic aneurysm ([place]) 04/15/2008  . COPD (chronic obstructive pulmonary disease) ([place]) 04/15/2008    Past Surgical History:  Procedure Laterality Date  . BACK SURGERY     x 3  . CARDIAC CATHETERIZATION     angioplasty  . CATARACT EXTRACTION W/PHACO  03/20/2012   Procedure: CATARACT EXTRACTION PHACO AND INTRAOCULAR LENS PLACEMENT (IOC);  Surgeon: Williams Che, MD;  Location: [facility];  Service: Ophthalmology;  Laterality: Right;  CDE:  8.45  . CATARACT EXTRACTION W/PHACO Left 04/02/2013   Procedure: CATARACT EXTRACTION PHACO AND INTRAOCULAR LENS PLACEMENT (IOC);  Surgeon: Williams Che, MD;  Location: [facility];  Service: Ophthalmology;  Laterality: Left;  CDE:  6.50  . CHOLECYSTECTOMY  2000  . COLONOSCOPY  2009   repeat 5 years  . ESOPHAGOGASTRODUODENOSCOPY    . HERNIA REPAIR Left    inguinal  . LAPAROSCOPIC PARTIAL COLECTOMY N/A 06/11/2013   Procedure: LAPAROSCOPIC HAND ASSISTED PARTIAL COLECTOMY;  Surgeon: Elta Guadeloupe  Lowella Petties, MD;  Location: [facility];  Service: General;  Laterality: N/A;  . right eye detached retina Bilateral   . YAG LASER APPLICATION Left 92/33/0076   Procedure: YAG LASER APPLICATION;  Surgeon: Williams Che, MD;  Location: [facility];  Service: Ophthalmology;  Laterality: Left;       Home Medications    Prior to Admission medications   Medication Sig Start Date End Date Taking? Authorizing Provider  albuterol (PROVENTIL HFA;VENTOLIN HFA) 108 (90 Base) MCG/ACT inhaler Inhale 1-2 puffs into the lungs  every 6 (six) hours as needed for wheezing or shortness of breath.    [provider]  albuterol (PROVENTIL) (2.5 MG/3ML) 0.083% nebulizer solution INHALE 1 VIAL VIA NEBULIZER FOUR TIMES DAILY. 11/29/16   Mikey Kirschner, MD  aspirin EC 81 MG tablet Take 81 mg by mouth daily.    [provider]  cetirizine (ZYRTEC) 10 MG tablet Take 10 mg by mouth daily.    [provider]  esomeprazole (NEXIUM) 20 MG capsule Take 20 mg by mouth daily at 12 noon.    [provider]  FLOVENT HFA 220 MCG/ACT inhaler INHALE 2 PUFFS INTO LUNGS 2 TIMES DAILY. 12/09/16   Mikey Kirschner, MD  guaiFENesin (MUCINEX) 600 MG 12 hr tablet Take 1 tablet (600 mg total) by mouth 2 (two) times daily. 09/17/16   Kathie Dike, MD  ibuprofen (ADVIL,MOTRIN) 200 MG tablet Take 800 mg by mouth daily as needed for moderate pain.    [provider]  losartan (COZAAR) 25 MG tablet TAKE ONE TABLET BY MOUTH ONCE DAILY. 11/08/16   Josue Hector, MD  lovastatin (MEVACOR) 20 MG tablet Take 20 mg by mouth at bedtime.    [provider]  lovastatin (MEVACOR) 40 MG tablet TAKE 2 TABLETS BY MOUTH DAILY. 12/23/16   Mikey Kirschner, MD  magic mouthwash SOLN Take 15 mLs by mouth 4 (four) times daily. Gargle and spit 10/25/16   Mikey Kirschner, MD  montelukast (SINGULAIR) 10 MG tablet TAKE ONE TABLET BY MOUTH AT BEDTIME AS NEEDED FOR ALLERGIES. 09/02/16   Mikey Kirschner, MD  Multiple Vitamin (MULTIVITAMIN WITH MINERALS) TABS tablet Take 1 tablet by mouth daily.    [provider]  nitroGLYCERIN (NITROSTAT) 0.4 MG SL tablet Place 1 tablet (0.4 mg total) under the tongue every 5 (five) minutes as needed. 01/20/16   Josue Hector, MD  pseudoephedrine-guaifenesin (MUCINEX D) 60-600 MG 12 hr tablet Take 1 tablet by mouth daily as needed for congestion.    [provider]  SULFACETAMIDE SODIUM-SULFUR EX Apply 1 application topically once.    [provider]    sulfamethoxazole-trimethoprim (BACTRIM DS,SEPTRA DS) 800-160 MG tablet Take 1 tablet by mouth 2 (two) times daily. 12/22/16   Mikey Kirschner, MD    Family History Family History  Problem Relation Age of Onset  . Hypertension Mother   . COPD Father   . Cancer Brother        brain    Social History Social History  Substance Use Topics  . Smoking status: Former Smoker    Packs/day: 1.00    Years: 35.00    Types: Cigarettes    Quit date: 03/28/2003  . Smokeless tobacco: Never Used  . Alcohol use No     Allergies   Lasix [furosemide]; Cefzil [cefprozil]; Dexamethasone; Gabapentin; Neomycin; Tetracyclines & related; Methocarbamol; and Penicillins   Review of Systems Review of Systems  Constitutional: Negative for chills and fever.  HENT: Positive for congestion and nosebleeds.   Respiratory: Negative for shortness of breath.   Cardiovascular: Negative for chest pain.  Gastrointestinal: Negative for abdominal pain and vomiting.  Neurological: Negative for light-headedness and headaches.     Physical Exam Updated Vital Signs BP 127/86 (BP Location: Left Arm)   Pulse 75   Resp 16   Ht 6\' 1"  (1.854 m)   Wt 95.3 kg (210 lb)   SpO2 95%   BMI 27.71 kg/m   Physical Exam  Constitutional: He is oriented to person, place, and time. He appears well-developed and well-nourished.  HENT:  Head: Normocephalic and atraumatic.  Patient has mild bleeding from both nares more significant from the right. Patient does have bleeding from right medial septal region. No significant clotting visualized.  Eyes: Conjunctivae are normal. Right eye exhibits no discharge. Left eye exhibits no discharge.  Neck: Normal range of motion. Neck supple. No tracheal deviation present.  Cardiovascular: Normal rate.   Pulmonary/Chest: Effort normal.  Abdominal: Soft. He exhibits no distension. There is no tenderness. There is no guarding.  Neurological: He is alert and oriented to person, place, and  time.  Skin: Skin is warm. No rash noted.  Psychiatric: He has a normal mood and affect.  Nursing note and vitals reviewed.    ED Treatments / Results  Labs (all labs ordered are listed, but only abnormal results are displayed) Labs Reviewed - No data to display  EKG  EKG Interpretation None       Radiology No results found.  Procedures Procedures (including critical care time) Nasal epistaxis treatment/medicine/packing Indication nosebleed on aspirin Perform a myself Initially pressure held directly followed by Afrin spray with pressure. TX acid 500 mg soaked and caught and placed in [place] followed by pressure. Silver nitrate cautery stick applied to medial septum on the right. Mild bleeding persisted, posterior right nasal pack rhino rocket placed. Bleeding controlled.     Medications Ordered in ED Medications  oxymetazoline (AFRIN) 0.05 % nasal spray 1 spray (not administered)  tranexamic acid (CYKLOKAPRON) injection 500 mg (500 mg Topical Given 12/25/16 0736)  silver nitrate applicators applicator 1 Stick (1 Stick Topical Given 12/25/16 0736)     Initial Impression / Assessment and Plan / ED Course  I have reviewed the triage vital signs and the nursing notes.  Pertinent labs & imaging results that were available during my care of the patient were reviewed by me and considered in my medical decision making (see chart for details).     Epistaxis on asa.  Multiple attempts with medicine to control bleeding.   Rhino Rocket placed bleeding controlled patient observed in the ER. Outpatient follow discussed.  Final Clinical Impressions(s) / ED Diagnoses   Final diagnoses:  Epistaxis    New Prescriptions New Prescriptions   No medications on file     Elnora Morrison, MD 12/25/16 [phone]

## 2016-12-25 NOTE — ED Triage Notes (Signed)
Pt states he is being tx for URI and this morning woke up with a nosebleed about 2am and cannot get to cease.

## 2016-12-27 ENCOUNTER — Emergency Department (HOSPITAL_COMMUNITY)
Admission: EM | Admit: 2016-12-27 | Discharge: 2016-12-27 | Disposition: A | Discharge status: Home / Self Care | Payer: Medicare Other | Attending: Emergency Medicine | Admitting: Emergency Medicine

## 2016-12-27 ENCOUNTER — Encounter (HOSPITAL_COMMUNITY): Payer: Self-pay | Admitting: Emergency Medicine

## 2016-12-27 DIAGNOSIS — R040 Epistaxis: Secondary | ICD-10-CM | POA: Diagnosis not present

## 2016-12-27 DIAGNOSIS — Z85828 Personal history of other malignant neoplasm of skin: Secondary | ICD-10-CM | POA: Diagnosis not present

## 2016-12-27 DIAGNOSIS — I252 Old myocardial infarction: Secondary | ICD-10-CM | POA: Diagnosis not present

## 2016-12-27 DIAGNOSIS — Z7982 Long term (current) use of aspirin: Secondary | ICD-10-CM | POA: Diagnosis not present

## 2016-12-27 DIAGNOSIS — Z79899 Other long term (current) drug therapy: Secondary | ICD-10-CM | POA: Insufficient documentation

## 2016-12-27 DIAGNOSIS — J449 Chronic obstructive pulmonary disease, unspecified: Secondary | ICD-10-CM | POA: Diagnosis not present

## 2016-12-27 DIAGNOSIS — I10 Essential (primary) hypertension: Secondary | ICD-10-CM | POA: Diagnosis not present

## 2016-12-27 DIAGNOSIS — I2510 Atherosclerotic heart disease of native coronary artery without angina pectoris: Secondary | ICD-10-CM | POA: Diagnosis not present

## 2016-12-27 DIAGNOSIS — Z87891 Personal history of nicotine dependence: Secondary | ICD-10-CM | POA: Diagnosis not present

## 2016-12-27 NOTE — ED Notes (Signed)
Nasal packing removed by PA from right nare. Patient advised to sit for 2-3 minutes then he will be ambulated to ensure no additional bleeding from nare. Ambulated patient down hallway and back. No bleeding noted from bilateral nares. Patient tolerated well. No complaints at this time. Patient has been sitting in room for approximately 5 minutes after ambulating with no bleeding or complaints.

## 2016-12-27 NOTE — Discharge Instructions (Signed)
Return if any problems.

## 2016-12-27 NOTE — ED Triage Notes (Signed)
Pt had nasal packing done here in ED x 2 days ago. States tried to fu with dr Benjamine Mola today but out of town for the week and office told him to come back to ED to get it removed. Nad. Denies pain

## 2016-12-27 NOTE — ED Provider Notes (Signed)
[facility] DEPT Provider Note   CSN: [id number] Arrival date & time: 12/27/16  1036     History   Chief Complaint Chief Complaint  Patient presents with  . Nasal Congestion    HPI [name] [name] is a 76 y.o. male.  The history is provided by the patient. No language interpreter was used.  Epistaxis   This is a new problem. The current episode started more than 2 days ago. The problem occurs constantly. The problem has not changed since onset.The problem is associated with an unknown factor.  Pt here for packing removal.  No complaints   Past Medical History:  Diagnosis Date  . AAA (abdominal aortic aneurysm) ([place])    needs yearly ultrasound  . Allergy   . Anemia   . Arthritis   . Asthma   . BPH (benign prostatic hyperplasia)   . CAD (coronary artery disease)   . Cancer ([place])    skin cancer  . COPD (chronic obstructive pulmonary disease) ([place])   . Dysrhythmia    pt. states it can be fast at times  . GERD (gastroesophageal reflux disease)   . Glaucoma   . HOH (hard of hearing)   . Hypercholesterolemia   . Hypertension   . Impaired fasting glucose   . Low back pain   . MI (myocardial infarction) ([place]) 1999  . Thrush     Patient Active Problem List   Diagnosis Date Noted  . CAP (community acquired pneumonia) 09/16/2016  . Pleuritic chest pain 09/16/2016  . HTN (hypertension) 09/16/2016  . Herpes zoster without complication 61/44/3154  . Oral candidiasis 04/01/2016  . Prostate hypertrophy 03/06/2016  . Depression 04/06/2015  . Ulnar neuropathy 12/14/2014  . Leg swelling 12/05/2014  . Neuropathic pain 12/05/2014  . S/P spinal surgery 12/05/2014  . Venous stasis dermatitis 12/05/2014  . Insomnia 12/05/2014  . Generalized pain   . UTI (lower urinary tract infection) 11/11/2014  . Thrush of mouth and esophagus ([place]) 11/11/2014  . Urinary retention   . Urinary tract infectious disease   . Lumbar spondylosis 11/05/2014  . Colon neoplasm 06/11/2013  .  GERD (gastroesophageal reflux disease) 01/19/2012  . PAC (premature atrial contraction) 09/15/2010  . ELEVATED BP READING WITHOUT DX HYPERTENSION 05/29/2009  . Mixed hyperlipidemia 04/15/2008  . BEN HTN HEART DISEASE WITHOUT HEART FAIL 04/15/2008  . Coronary atherosclerosis 04/15/2008  . Abdominal aortic aneurysm ([place]) 04/15/2008  . COPD (chronic obstructive pulmonary disease) ([place]) 04/15/2008    Past Surgical History:  Procedure Laterality Date  . BACK SURGERY     x 3  . CARDIAC CATHETERIZATION     angioplasty  . CATARACT EXTRACTION W/PHACO  03/20/2012   Procedure: CATARACT EXTRACTION PHACO AND INTRAOCULAR LENS PLACEMENT (IOC);  Surgeon: Williams Che, MD;  Location: [facility];  Service: Ophthalmology;  Laterality: Right;  CDE:  8.45  . CATARACT EXTRACTION W/PHACO Left 04/02/2013   Procedure: CATARACT EXTRACTION PHACO AND INTRAOCULAR LENS PLACEMENT (IOC);  Surgeon: Williams Che, MD;  Location: [facility];  Service: Ophthalmology;  Laterality: Left;  CDE:  6.50  . CHOLECYSTECTOMY  2000  . COLONOSCOPY  2009   repeat 5 years  . ESOPHAGOGASTRODUODENOSCOPY    . HERNIA REPAIR Left    inguinal  . LAPAROSCOPIC PARTIAL COLECTOMY N/A 06/11/2013   Procedure: LAPAROSCOPIC HAND ASSISTED PARTIAL COLECTOMY;  Surgeon: Jamesetta So, MD;  Location: [facility];  Service: General;  Laterality: N/A;  . right eye detached retina Bilateral   . YAG LASER APPLICATION  Left 05/06/2014   Procedure: YAG LASER APPLICATION;  Surgeon: Williams Che, MD;  Location: [facility];  Service: Ophthalmology;  Laterality: Left;       Home Medications    Prior to Admission medications   Medication Sig Start Date End Date Taking? Authorizing Provider  albuterol (PROVENTIL HFA;VENTOLIN HFA) 108 (90 Base) MCG/ACT inhaler Inhale 1-2 puffs into the lungs every 6 (six) hours as needed for wheezing or shortness of breath.    [provider]  albuterol (PROVENTIL) (2.5 MG/3ML) 0.083% nebulizer solution INHALE 1 VIAL VIA  NEBULIZER FOUR TIMES DAILY. 11/29/16   Mikey Kirschner, MD  aspirin EC 81 MG tablet Take 81 mg by mouth daily.    [provider]  cetirizine (ZYRTEC) 10 MG tablet Take 10 mg by mouth daily.    [provider]  esomeprazole (NEXIUM) 20 MG capsule Take 20 mg by mouth daily at 12 noon.    [provider]  FLOVENT HFA 220 MCG/ACT inhaler INHALE 2 PUFFS INTO LUNGS 2 TIMES DAILY. 12/09/16   Mikey Kirschner, MD  guaiFENesin (MUCINEX) 600 MG 12 hr tablet Take 1 tablet (600 mg total) by mouth 2 (two) times daily. 09/17/16   Kathie Dike, MD  ibuprofen (ADVIL,MOTRIN) 200 MG tablet Take 800 mg by mouth daily as needed for moderate pain.    [provider]  losartan (COZAAR) 25 MG tablet TAKE ONE TABLET BY MOUTH ONCE DAILY. 11/08/16   Josue Hector, MD  lovastatin (MEVACOR) 20 MG tablet Take 20 mg by mouth at bedtime.    [provider]  lovastatin (MEVACOR) 40 MG tablet TAKE 2 TABLETS BY MOUTH DAILY. 12/23/16   Mikey Kirschner, MD  magic mouthwash SOLN Take 15 mLs by mouth 4 (four) times daily. Gargle and spit 10/25/16   Mikey Kirschner, MD  montelukast (SINGULAIR) 10 MG tablet TAKE ONE TABLET BY MOUTH AT BEDTIME AS NEEDED FOR ALLERGIES. 09/02/16   Mikey Kirschner, MD  Multiple Vitamin (MULTIVITAMIN WITH MINERALS) TABS tablet Take 1 tablet by mouth daily.    [provider]  nitroGLYCERIN (NITROSTAT) 0.4 MG SL tablet Place 1 tablet (0.4 mg total) under the tongue every 5 (five) minutes as needed. 01/20/16   Josue Hector, MD  pseudoephedrine-guaifenesin (MUCINEX D) 60-600 MG 12 hr tablet Take 1 tablet by mouth daily as needed for congestion.    [provider]  SULFACETAMIDE SODIUM-SULFUR EX Apply 1 application topically once.    [provider]  sulfamethoxazole-trimethoprim (BACTRIM DS,SEPTRA DS) 800-160 MG tablet Take 1 tablet by mouth 2 (two) times daily. 12/22/16   Mikey Kirschner, MD    Family History Family History    Problem Relation Age of Onset  . Hypertension Mother   . COPD Father   . Cancer Brother        brain    Social History Social History  Substance Use Topics  . Smoking status: Former Smoker    Packs/day: 1.00    Years: 35.00    Types: Cigarettes    Quit date: 03/28/2003  . Smokeless tobacco: Never Used  . Alcohol use No     Allergies   Lasix [furosemide]; Cefzil [cefprozil]; Dexamethasone; Gabapentin; Neomycin; Tetracyclines & related; Methocarbamol; and Penicillins   Review of Systems Review of Systems  HENT: Positive for nosebleeds.   All other systems reviewed and are negative.    Physical Exam Updated Vital Signs BP 129/69 (BP Location: Right Arm)   Pulse 96  Temp 98.7 F (37.1 C) (Oral)   Resp 18   SpO2 95%   Physical Exam  Constitutional: He appears well-developed and well-nourished.  HENT:  Head: Normocephalic.  Nose: Nose normal.  Packing removed,  Pt ambulated without bleeding  Musculoskeletal: Normal range of motion.  Neurological: He is alert.  Skin: Skin is warm.  Psychiatric: He has a normal mood and affect.  Nursing note and vitals reviewed.    ED Treatments / Results  Labs (all labs ordered are listed, but only abnormal results are displayed) Labs Reviewed - No data to display  EKG  EKG Interpretation None       Radiology No results found.  Procedures Procedures (including critical care time)  Medications Ordered in ED Medications - No data to display   Initial Impression / Assessment and Plan / ED Course  I have reviewed the triage vital signs and the nursing notes.  Pertinent labs & imaging results that were available during my care of the patient were reviewed by me and considered in my medical decision making (see chart for details).      Final Clinical Impressions(s) / ED Diagnoses   Final diagnoses:  Nosebleed    New Prescriptions New Prescriptions   No medications on file  An After Visit Summary was  printed and given to the patient.   Fransico Meadow, [place] 12/27/16 1126    Long, Wonda [facility], MD 12/27/16 1455

## 2016-12-30 DIAGNOSIS — R040 Epistaxis: Secondary | ICD-10-CM | POA: Diagnosis not present

## 2016-12-31 ENCOUNTER — Emergency Department (HOSPITAL_COMMUNITY)
Admission: EM | Admit: 2016-12-31 | Discharge: 2017-01-01 | Disposition: A | Discharge status: Home / Self Care | Payer: Medicare Other | Attending: Emergency Medicine | Admitting: Emergency Medicine

## 2016-12-31 ENCOUNTER — Encounter (HOSPITAL_COMMUNITY): Payer: Self-pay | Admitting: Emergency Medicine

## 2016-12-31 DIAGNOSIS — Z87891 Personal history of nicotine dependence: Secondary | ICD-10-CM | POA: Diagnosis not present

## 2016-12-31 DIAGNOSIS — R040 Epistaxis: Secondary | ICD-10-CM | POA: Diagnosis not present

## 2016-12-31 DIAGNOSIS — R030 Elevated blood-pressure reading, without diagnosis of hypertension: Secondary | ICD-10-CM | POA: Diagnosis not present

## 2016-12-31 DIAGNOSIS — I10 Essential (primary) hypertension: Secondary | ICD-10-CM | POA: Diagnosis not present

## 2016-12-31 DIAGNOSIS — J45909 Unspecified asthma, uncomplicated: Secondary | ICD-10-CM | POA: Insufficient documentation

## 2016-12-31 DIAGNOSIS — I2510 Atherosclerotic heart disease of native coronary artery without angina pectoris: Secondary | ICD-10-CM | POA: Diagnosis not present

## 2016-12-31 DIAGNOSIS — J449 Chronic obstructive pulmonary disease, unspecified: Secondary | ICD-10-CM | POA: Diagnosis not present

## 2016-12-31 DIAGNOSIS — Z85828 Personal history of other malignant neoplasm of skin: Secondary | ICD-10-CM | POA: Insufficient documentation

## 2016-12-31 DIAGNOSIS — Z7982 Long term (current) use of aspirin: Secondary | ICD-10-CM | POA: Insufficient documentation

## 2016-12-31 DIAGNOSIS — Z79899 Other long term (current) drug therapy: Secondary | ICD-10-CM | POA: Insufficient documentation

## 2016-12-31 NOTE — ED Triage Notes (Signed)
Pt has had on/off nose bleed X1 week, has been seen at [facility] for same. Informed if nose started bleeding again to come to [facility] for ENT services. Given Afrin spray each nostril X1 en route. VSS.

## 2016-12-31 NOTE — ED Provider Notes (Signed)
[facility] DEPT Provider Note   CSN: [id number] Arrival date & time: 12/31/16  2227     History   Chief Complaint Chief Complaint  Patient presents with  . Epistaxis    HPI [name] [name] is a 76 y.o. male.  76 yo M with a chief complaint of a nosebleed. The started about 3 hours ago. Stopped just prior to arrival. The patient has had an issue with nosebleeds over the past week. Had a Rhino Rocket placed and then followed up with Dr. Lucia Gaskins in the office couple days ago. He has been doing netty pot solution and has Afrin at home for when he has a nosebleed. Just finished Bactrim for a upper respiratory infection today. Stop taking his aspirin about a week ago. According to the patient Dr. Lucia Gaskins was unable to find a source of bleeding. Told him if it recurred to return to the most common emergency department.   The history is provided by the patient and the spouse.  Epistaxis   This is a recurrent problem. The current episode started 3 to 5 hours ago. The problem occurs constantly. The problem has been resolved. The bleeding has been from the right nare. He has tried applying pressure for the symptoms. The treatment provided significant relief.    Past Medical History:  Diagnosis Date  . AAA (abdominal aortic aneurysm) ([place])    needs yearly ultrasound  . Allergy   . Anemia   . Arthritis   . Asthma   . BPH (benign prostatic hyperplasia)   . CAD (coronary artery disease)   . Cancer ([place])    skin cancer  . COPD (chronic obstructive pulmonary disease) ([place])   . Dysrhythmia    pt. states it can be fast at times  . GERD (gastroesophageal reflux disease)   . Glaucoma   . HOH (hard of hearing)   . Hypercholesterolemia   . Hypertension   . Impaired fasting glucose   . Low back pain   . MI (myocardial infarction) ([place]) 1999  . Thrush     Patient Active Problem List   Diagnosis Date Noted  . CAP (community acquired pneumonia) 09/16/2016  . Pleuritic chest pain  09/16/2016  . HTN (hypertension) 09/16/2016  . Herpes zoster without complication 93/81/0175  . Oral candidiasis 04/01/2016  . Prostate hypertrophy 03/06/2016  . Depression 04/06/2015  . Ulnar neuropathy 12/14/2014  . Leg swelling 12/05/2014  . Neuropathic pain 12/05/2014  . S/P spinal surgery 12/05/2014  . Venous stasis dermatitis 12/05/2014  . Insomnia 12/05/2014  . Generalized pain   . UTI (lower urinary tract infection) 11/11/2014  . Thrush of mouth and esophagus ([place]) 11/11/2014  . Urinary retention   . Urinary tract infectious disease   . Lumbar spondylosis 11/05/2014  . Colon neoplasm 06/11/2013  . GERD (gastroesophageal reflux disease) 01/19/2012  . PAC (premature atrial contraction) 09/15/2010  . ELEVATED BP READING WITHOUT DX HYPERTENSION 05/29/2009  . Mixed hyperlipidemia 04/15/2008  . BEN HTN HEART DISEASE WITHOUT HEART FAIL 04/15/2008  . Coronary atherosclerosis 04/15/2008  . Abdominal aortic aneurysm ([place]) 04/15/2008  . COPD (chronic obstructive pulmonary disease) ([place]) 04/15/2008    Past Surgical History:  Procedure Laterality Date  . BACK SURGERY     x 3  . CARDIAC CATHETERIZATION     angioplasty  . CATARACT EXTRACTION W/PHACO  03/20/2012   Procedure: CATARACT EXTRACTION PHACO AND INTRAOCULAR LENS PLACEMENT (IOC);  Surgeon: Williams Che, MD;  Location: [facility];  Service: Ophthalmology;  Laterality:  Right;  CDE:  8.45  . CATARACT EXTRACTION W/PHACO Left 04/02/2013   Procedure: CATARACT EXTRACTION PHACO AND INTRAOCULAR LENS PLACEMENT (IOC);  Surgeon: Williams Che, MD;  Location: [facility];  Service: Ophthalmology;  Laterality: Left;  CDE:  6.50  . CHOLECYSTECTOMY  2000  . COLONOSCOPY  2009   repeat 5 years  . ESOPHAGOGASTRODUODENOSCOPY    . HERNIA REPAIR Left    inguinal  . LAPAROSCOPIC PARTIAL COLECTOMY N/A 06/11/2013   Procedure: LAPAROSCOPIC HAND ASSISTED PARTIAL COLECTOMY;  Surgeon: Jamesetta So, MD;  Location: [facility];  Service: General;   Laterality: N/A;  . right eye detached retina Bilateral   . YAG LASER APPLICATION Left 22/29/7989   Procedure: YAG LASER APPLICATION;  Surgeon: Williams Che, MD;  Location: [facility];  Service: Ophthalmology;  Laterality: Left;       Home Medications    Prior to Admission medications   Medication Sig Start Date End Date Taking? Authorizing Provider  albuterol (PROVENTIL HFA;VENTOLIN HFA) 108 (90 Base) MCG/ACT inhaler Inhale 1-2 puffs into the lungs every 6 (six) hours as needed for wheezing or shortness of breath.    [provider]  albuterol (PROVENTIL) (2.5 MG/3ML) 0.083% nebulizer solution INHALE 1 VIAL VIA NEBULIZER FOUR TIMES DAILY. 11/29/16   Mikey Kirschner, MD  aspirin EC 81 MG tablet Take 81 mg by mouth daily.    [provider]  cetirizine (ZYRTEC) 10 MG tablet Take 10 mg by mouth daily.    [provider]  esomeprazole (NEXIUM) 20 MG capsule Take 20 mg by mouth daily at 12 noon.    [provider]  FLOVENT HFA 220 MCG/ACT inhaler INHALE 2 PUFFS INTO LUNGS 2 TIMES DAILY. 12/09/16   Mikey Kirschner, MD  guaiFENesin (MUCINEX) 600 MG 12 hr tablet Take 1 tablet (600 mg total) by mouth 2 (two) times daily. 09/17/16   Kathie Dike, MD  ibuprofen (ADVIL,MOTRIN) 200 MG tablet Take 800 mg by mouth daily as needed for moderate pain.    [provider]  losartan (COZAAR) 25 MG tablet TAKE ONE TABLET BY MOUTH ONCE DAILY. 11/08/16   Josue Hector, MD  lovastatin (MEVACOR) 20 MG tablet Take 20 mg by mouth at bedtime.    [provider]  lovastatin (MEVACOR) 40 MG tablet TAKE 2 TABLETS BY MOUTH DAILY. 12/23/16   Mikey Kirschner, MD  magic mouthwash SOLN Take 15 mLs by mouth 4 (four) times daily. Gargle and spit 10/25/16   Mikey Kirschner, MD  montelukast (SINGULAIR) 10 MG tablet TAKE ONE TABLET BY MOUTH AT BEDTIME AS NEEDED FOR ALLERGIES. 09/02/16   Mikey Kirschner, MD  Multiple Vitamin (MULTIVITAMIN WITH MINERALS) TABS tablet Take 1  tablet by mouth daily.    [provider]  nitroGLYCERIN (NITROSTAT) 0.4 MG SL tablet Place 1 tablet (0.4 mg total) under the tongue every 5 (five) minutes as needed. 01/20/16   Josue Hector, MD  pseudoephedrine-guaifenesin (MUCINEX D) 60-600 MG 12 hr tablet Take 1 tablet by mouth daily as needed for congestion.    [provider]  SULFACETAMIDE SODIUM-SULFUR EX Apply 1 application topically once.    [provider]  sulfamethoxazole-trimethoprim (BACTRIM DS,SEPTRA DS) 800-160 MG tablet Take 1 tablet by mouth 2 (two) times daily. 12/22/16   Mikey Kirschner, MD    Family History Family History  Problem Relation Age of Onset  . Hypertension Mother   . COPD Father   . Cancer Brother  brain    Social History Social History  Substance Use Topics  . Smoking status: Former Smoker    Packs/day: 1.00    Years: 35.00    Types: Cigarettes    Quit date: 03/28/2003  . Smokeless tobacco: Never Used  . Alcohol use No     Allergies   Lasix [furosemide]; Cefzil [cefprozil]; Dexamethasone; Gabapentin; Neomycin; Tetracyclines & related; Methocarbamol; and Penicillins   Review of Systems Review of Systems  Constitutional: Negative for chills and fever.  HENT: Positive for nosebleeds. Negative for congestion and facial swelling.   Eyes: Negative for discharge and visual disturbance.  Respiratory: Negative for shortness of breath.   Cardiovascular: Negative for chest pain and palpitations.  Gastrointestinal: Negative for abdominal pain, diarrhea and vomiting.  Musculoskeletal: Negative for arthralgias and myalgias.  Skin: Negative for color change and rash.  Neurological: Negative for tremors, syncope and headaches.  Psychiatric/Behavioral: Negative for confusion and dysphoric mood.     Physical Exam Updated Vital Signs BP 132/68 (BP Location: Left Arm)   Pulse 97   Temp 98.2 F (36.8 C) (Oral)   Resp 18   Ht 6\' 1"  (1.854 m)   Wt 95.3 kg (210 lb)    SpO2 97%   BMI 27.71 kg/m   Physical Exam  Constitutional: He is oriented to person, place, and time. He appears well-developed and well-nourished.  HENT:  Head: Normocephalic and atraumatic.  Trace dried blood in the right naris.  No noted vascularity in Kiesselbach's plexus  Eyes: Pupils are equal, round, and reactive to light. EOM are normal.  Neck: Normal range of motion. Neck supple. No JVD present.  Cardiovascular: Normal rate and regular rhythm.  Exam reveals no gallop and no friction rub.   No murmur heard. Pulmonary/Chest: No respiratory distress. He has no wheezes.  Abdominal: He exhibits no distension and no mass. There is no tenderness. There is no rebound and no guarding.  Musculoskeletal: Normal range of motion.  Neurological: He is alert and oriented to person, place, and time.  Skin: No rash noted. No pallor.  Psychiatric: He has a normal mood and affect. His behavior is normal.  Nursing note and vitals reviewed.    ED Treatments / Results  Labs (all labs ordered are listed, but only abnormal results are displayed) Labs Reviewed - No data to display  EKG  EKG Interpretation None       Radiology No results found.  Procedures Procedures (including critical care time)  Medications Ordered in ED Medications - No data to display   Initial Impression / Assessment and Plan / ED Course  I have reviewed the triage vital signs and the nursing notes.  Pertinent labs & imaging results that were available during my care of the patient were reviewed by me and considered in my medical decision making (see chart for details).     76 yo M with a chief complaint of epistaxis. Patient has no bleeding on arrival to the ED. I'm unable to visualize the source. Will have him call his ENT on Monday.  11:38 PM:  I have discussed the diagnosis/risks/treatment options with the patient and family and believe the pt to be eligible for discharge home to follow-up with ENT. We  also discussed returning to the ED immediately if new or worsening sx occur. We discussed the sx which are most concerning (e.g., recurrent bleeding) that necessitate immediate return. Medications administered to the patient during their visit and any new prescriptions provided to the patient are  listed below.  Medications given during this visit Medications - No data to display   The patient appears reasonably screen and/or stabilized for discharge and I doubt any other medical condition or other [facility] requiring further screening, evaluation, or treatment in the ED at this time prior to discharge.    Final Clinical Impressions(s) / ED Diagnoses   Final diagnoses:  Right-sided epistaxis    New Prescriptions New Prescriptions   No medications on file     Deno Etienne, DO 12/31/16 2338

## 2017-01-04 DIAGNOSIS — R040 Epistaxis: Secondary | ICD-10-CM | POA: Diagnosis not present

## 2017-01-13 ENCOUNTER — Ambulatory Visit (INDEPENDENT_AMBULATORY_CARE_PROVIDER_SITE_OTHER): Payer: Medicare Other | Admitting: Otolaryngology

## 2017-01-13 DIAGNOSIS — R040 Epistaxis: Secondary | ICD-10-CM

## 2017-01-23 ENCOUNTER — Encounter (HOSPITAL_COMMUNITY): Payer: Self-pay | Admitting: Emergency Medicine

## 2017-01-23 ENCOUNTER — Emergency Department (HOSPITAL_COMMUNITY)
Admission: EM | Admit: 2017-01-23 | Discharge: 2017-01-23 | Disposition: A | Discharge status: Home / Self Care | Payer: Medicare Other | Attending: Emergency Medicine | Admitting: Emergency Medicine

## 2017-01-23 DIAGNOSIS — J45909 Unspecified asthma, uncomplicated: Secondary | ICD-10-CM | POA: Diagnosis not present

## 2017-01-23 DIAGNOSIS — I10 Essential (primary) hypertension: Secondary | ICD-10-CM | POA: Diagnosis not present

## 2017-01-23 DIAGNOSIS — R040 Epistaxis: Secondary | ICD-10-CM | POA: Diagnosis present

## 2017-01-23 DIAGNOSIS — J449 Chronic obstructive pulmonary disease, unspecified: Secondary | ICD-10-CM | POA: Insufficient documentation

## 2017-01-23 DIAGNOSIS — Z87891 Personal history of nicotine dependence: Secondary | ICD-10-CM | POA: Diagnosis not present

## 2017-01-23 DIAGNOSIS — I2510 Atherosclerotic heart disease of native coronary artery without angina pectoris: Secondary | ICD-10-CM | POA: Diagnosis not present

## 2017-01-23 DIAGNOSIS — Z7982 Long term (current) use of aspirin: Secondary | ICD-10-CM | POA: Insufficient documentation

## 2017-01-23 DIAGNOSIS — Z79899 Other long term (current) drug therapy: Secondary | ICD-10-CM | POA: Insufficient documentation

## 2017-01-23 MED ORDER — BACITRACIN ZINC 500 UNIT/GM EX OINT
TOPICAL_OINTMENT | CUTANEOUS | Status: AC
  Administered 2017-01-23: 20:00:00
  Filled 2017-01-23: qty 2.7

## 2017-01-23 MED ORDER — LIDOCAINE-EPINEPHRINE (PF) 1 %-1:200000 IJ SOLN
INTRAMUSCULAR | Status: AC
  Administered 2017-01-23: 20:00:00
  Filled 2017-01-23: qty 30

## 2017-01-23 MED ORDER — PHENYLEPHRINE HCL 1 % NA SOLN
NASAL | Status: AC
  Administered 2017-01-23: 20:00:00
  Filled 2017-01-23: qty 15

## 2017-01-23 MED ORDER — SILVER NITRATE-POT NITRATE 75-25 % EX MISC
CUTANEOUS | Status: AC
  Filled 2017-01-23: qty 1

## 2017-01-23 NOTE — Discharge Instructions (Signed)
Please apply the antibiotic ointment when you get up in the morning with the Q tip gently Please avoid blowing your nose, See Dr. Benjamine Mola in the next week for recheck

## 2017-01-23 NOTE — ED Notes (Signed)
Cotton packing soaked in Lidocaine with Epi placed into right nare by Dr. Sabra Heck.  Bleeding controlled by this.

## 2017-01-23 NOTE — ED Provider Notes (Signed)
[facility] DEPT Provider Note   CSN: [id number] Arrival date & time: 01/23/17  1734     History   Chief Complaint Chief Complaint  Patient presents with  . Epistaxis    HPI [name] [name] is a 76 y.o. male.  HPI  The patient is a 76 year old male with a history of epistaxis from the right nostril which has been dating back several months, has had several emergency department visits including several weeks ago and followed up with the ear nose and throat specialist Dr. Benjamine Mola in the office at which time he had cauterization. He has required packing prior to that. He has been using gentle nasal rinses with saline - but had onset of bleeding - He reports that he has had recurrent nosebleed that started last night with a very small amount of blood and became much worse today. He tried taking Neo-Synephrine and packing his nose at home but this did not help and he continued to bleed. He reports that it is better when he holds his nose in the front, when he does not he does have some drainage both from the front and the back of the nose. He is not on any anticoagulants, he has not had aspirin in 2 months.  No fevers, chills, headache, sore throat, visual changes, neck pain, back pain, chest pain, abdominal pain, shortness of breath, cough, dysuria, diarrhea, rectal bleeding, swelling, rashes, numbness or weakness.   Past Medical History:  Diagnosis Date  . AAA (abdominal aortic aneurysm) ([place])    needs yearly ultrasound  . Allergy   . Anemia   . Arthritis   . Asthma   . BPH (benign prostatic hyperplasia)   . CAD (coronary artery disease)   . Cancer ([place])    skin cancer  . COPD (chronic obstructive pulmonary disease) ([place])   . Dysrhythmia    pt. states it can be fast at times  . GERD (gastroesophageal reflux disease)   . Glaucoma   . HOH (hard of hearing)   . Hypercholesterolemia   . Hypertension   . Impaired fasting glucose   . Low back pain   . MI (myocardial infarction)  ([place]) 1999  . Thrush     Patient Active Problem List   Diagnosis Date Noted  . CAP (community acquired pneumonia) 09/16/2016  . Pleuritic chest pain 09/16/2016  . HTN (hypertension) 09/16/2016  . Herpes zoster without complication 17/61/6073  . Oral candidiasis 04/01/2016  . Prostate hypertrophy 03/06/2016  . Depression 04/06/2015  . Ulnar neuropathy 12/14/2014  . Leg swelling 12/05/2014  . Neuropathic pain 12/05/2014  . S/P spinal surgery 12/05/2014  . Venous stasis dermatitis 12/05/2014  . Insomnia 12/05/2014  . Generalized pain   . UTI (lower urinary tract infection) 11/11/2014  . Thrush of mouth and esophagus ([place]) 11/11/2014  . Urinary retention   . Urinary tract infectious disease   . Lumbar spondylosis 11/05/2014  . Colon neoplasm 06/11/2013  . GERD (gastroesophageal reflux disease) 01/19/2012  . PAC (premature atrial contraction) 09/15/2010  . ELEVATED BP READING WITHOUT DX HYPERTENSION 05/29/2009  . Mixed hyperlipidemia 04/15/2008  . BEN HTN HEART DISEASE WITHOUT HEART FAIL 04/15/2008  . Coronary atherosclerosis 04/15/2008  . Abdominal aortic aneurysm ([place]) 04/15/2008  . COPD (chronic obstructive pulmonary disease) ([place]) 04/15/2008    Past Surgical History:  Procedure Laterality Date  . BACK SURGERY     x 3  . CARDIAC CATHETERIZATION     angioplasty  . CATARACT EXTRACTION W/PHACO  03/20/2012  Procedure: CATARACT EXTRACTION PHACO AND INTRAOCULAR LENS PLACEMENT (IOC);  Surgeon: Williams Che, MD;  Location: [facility];  Service: Ophthalmology;  Laterality: Right;  CDE:  8.45  . CATARACT EXTRACTION W/PHACO Left 04/02/2013   Procedure: CATARACT EXTRACTION PHACO AND INTRAOCULAR LENS PLACEMENT (IOC);  Surgeon: Williams Che, MD;  Location: [facility];  Service: Ophthalmology;  Laterality: Left;  CDE:  6.50  . CHOLECYSTECTOMY  2000  . COLONOSCOPY  2009   repeat 5 years  . ESOPHAGOGASTRODUODENOSCOPY    . HERNIA REPAIR Left    inguinal  . LAPAROSCOPIC PARTIAL  COLECTOMY N/A 06/11/2013   Procedure: LAPAROSCOPIC HAND ASSISTED PARTIAL COLECTOMY;  Surgeon: Jamesetta So, MD;  Location: [facility];  Service: General;  Laterality: N/A;  . right eye detached retina Bilateral   . YAG LASER APPLICATION Left 40/98/1191   Procedure: YAG LASER APPLICATION;  Surgeon: Williams Che, MD;  Location: [facility];  Service: Ophthalmology;  Laterality: Left;       Home Medications    Prior to Admission medications   Medication Sig Start Date End Date Taking? Authorizing Provider  albuterol (PROVENTIL) (2.5 MG/3ML) 0.083% nebulizer solution INHALE 1 VIAL VIA NEBULIZER FOUR TIMES DAILY. 11/29/16  Yes Mikey Kirschner, MD  cetirizine (ZYRTEC) 10 MG tablet Take 10 mg by mouth daily.   Yes [provider]  esomeprazole (NEXIUM) 20 MG capsule Take 20 mg by mouth daily at 12 noon.   Yes [provider]  FLOVENT HFA 220 MCG/ACT inhaler INHALE 2 PUFFS INTO LUNGS 2 TIMES DAILY. 12/09/16  Yes Mikey Kirschner, MD  losartan (COZAAR) 25 MG tablet TAKE ONE TABLET BY MOUTH ONCE DAILY. 11/08/16  Yes Josue Hector, MD  lovastatin (MEVACOR) 40 MG tablet TAKE 2 TABLETS BY MOUTH DAILY. Patient taking differently: Patient takes 1 tablet once a day 12/23/16  Yes Luking, Grace Bushy, MD  nitroGLYCERIN (NITROSTAT) 0.4 MG SL tablet Place 1 tablet (0.4 mg total) under the tongue every 5 (five) minutes as needed. 01/20/16  Yes Josue Hector, MD  aspirin EC 81 MG tablet Take 81 mg by mouth daily.    [provider]  montelukast (SINGULAIR) 10 MG tablet TAKE ONE TABLET BY MOUTH AT BEDTIME AS NEEDED FOR ALLERGIES. Patient not taking: Reported on 01/23/2017 09/02/16   Mikey Kirschner, MD    Family History Family History  Problem Relation Age of Onset  . Hypertension Mother   . COPD Father   . Cancer Brother        brain    Social History Social History  Substance Use Topics  . Smoking status: Former Smoker    Packs/day: 1.00    Years: 35.00    Types: Cigarettes      Quit date: 03/28/2003  . Smokeless tobacco: Never Used  . Alcohol use No     Allergies   Lasix [furosemide]; Cefzil [cefprozil]; Dexamethasone; Gabapentin; Neomycin; Tetracyclines & related; Methocarbamol; and Penicillins   Review of Systems Review of Systems  HENT: Positive for nosebleeds.   All other systems reviewed and are negative.    Physical Exam Updated Vital Signs BP (!) 170/87   Pulse 90   Resp 18   Ht 6\' 1"  (1.854 m)   Wt 95.3 kg (210 lb)   SpO2 94%   BMI 27.71 kg/m   Physical Exam  Constitutional:  Well-appearing, no distress, no diaphoresis  HENT:  Normocephalic, atraumatic, right nostril with bleeding from the septum around the area that has been  cauterized. Left nostril clear, oropharynx with some blood clot in the posterior pharynx  Eyes:  Conjunctiva clear, no discharge drainage or icterus  Neck:  Supple neck, no lymphadenopathy  Cardiovascular:  No peripheral edema, no tachycardia, no murmurs  Pulmonary/Chest:  Lungs clear, no increased work of breathing  Musculoskeletal:  No edema  Neurological:  Normal speech gait and coordination  Skin:  No significant rash     ED Treatments / Results  Labs (all labs ordered are listed, but only abnormal results are displayed) Labs Reviewed - No data to display   Radiology No results found.  Procedures .Epistaxis Management Date/Time: 01/23/2017 7:58 PM Performed by: Noemi Chapel Authorized by: Noemi Chapel   Consent:    Consent obtained:  Verbal   Consent given by:  Patient   Risks discussed:  Bleeding, nasal injury and pain Anesthesia (see MAR for exact dosages):    Anesthesia method:  Topical application   Topical anesthesia: 1% lidocaine with epinephrine-soaked pledget. Procedure details:    Treatment site:  R anterior   Treatment method:  Anterior pack and silver nitrate   Treatment complexity:  Limited   Treatment episode: recurring   Post-procedure details:    Assessment:   Bleeding stopped   Patient tolerance of procedure:  Tolerated well, no immediate complications Comments:     After the lidocaine-soaked pledget was removed, visualized area of small amount of bleeding was noted on the septum around the area that had previously been cauterized. I used silver nitrate chemical cautery successfully, there was no further bleeding after one hour of observation.   (including critical care time)  Medications Ordered in ED Medications  lidocaine-EPINEPHrine (XYLOCAINE-EPINEPHrine) 1 %-1:200000 (PF) injection (not administered)  bacitracin 500 UNIT/GM ointment (not administered)  phenylephrine (NEO-SYNEPHRINE) 1 % nasal drops (  Given by Other 01/23/17 1959)     Initial Impression / Assessment and Plan / ED Course  I have reviewed the triage vital signs and the nursing notes.  Pertinent labs & imaging results that were available during my care of the patient were reviewed by me and considered in my medical decision making (see chart for details).     I personally packed the patient's nose with cotton pledgets, soak this with lidocaine with epinephrine after having the patient evacuate blood clot.we'll reevaluate  After multiple re-evaluations the patient ultimately had no further bleedingafter silver nitrate was used for chemical cautery. He tolerated this well, he requested no packing be placed and states he will come back if his nose starts to bleed again. He was given some bacitracin and sterile Q-tips to take home to apply to the inside of his nose in the morning. He will avoid any other nasal sprays unless he starts bleeding.  Final Clinical Impressions(s) / ED Diagnoses   Final diagnoses:  Right-sided epistaxis    New Prescriptions New Prescriptions   No medications on file     Noemi Chapel, MD 01/23/17 [id number]

## 2017-01-23 NOTE — ED Triage Notes (Signed)
Pt reports epistaxis ongoing for about 4 weeks.  States he saw Dr. Melene Plan and had cauterization done.  Began bleeding again today.

## 2017-01-25 ENCOUNTER — Other Ambulatory Visit: Payer: Self-pay | Admitting: Family Medicine

## 2017-02-01 DIAGNOSIS — R040 Epistaxis: Secondary | ICD-10-CM | POA: Diagnosis not present

## 2017-02-18 DIAGNOSIS — Z23 Encounter for immunization: Secondary | ICD-10-CM | POA: Diagnosis not present

## 2017-03-02 ENCOUNTER — Encounter (HOSPITAL_COMMUNITY): Payer: Self-pay | Admitting: *Deleted

## 2017-03-02 ENCOUNTER — Emergency Department (HOSPITAL_COMMUNITY)
Admission: EM | Admit: 2017-03-02 | Discharge: 2017-03-02 | Disposition: A | Discharge status: Home / Self Care | Payer: Medicare Other | Attending: Emergency Medicine | Admitting: Emergency Medicine

## 2017-03-02 DIAGNOSIS — Z7982 Long term (current) use of aspirin: Secondary | ICD-10-CM | POA: Insufficient documentation

## 2017-03-02 DIAGNOSIS — Z87891 Personal history of nicotine dependence: Secondary | ICD-10-CM | POA: Insufficient documentation

## 2017-03-02 DIAGNOSIS — R040 Epistaxis: Secondary | ICD-10-CM | POA: Diagnosis not present

## 2017-03-02 DIAGNOSIS — I2510 Atherosclerotic heart disease of native coronary artery without angina pectoris: Secondary | ICD-10-CM | POA: Diagnosis not present

## 2017-03-02 DIAGNOSIS — J449 Chronic obstructive pulmonary disease, unspecified: Secondary | ICD-10-CM | POA: Diagnosis not present

## 2017-03-02 DIAGNOSIS — Z85828 Personal history of other malignant neoplasm of skin: Secondary | ICD-10-CM | POA: Insufficient documentation

## 2017-03-02 DIAGNOSIS — I119 Hypertensive heart disease without heart failure: Secondary | ICD-10-CM | POA: Insufficient documentation

## 2017-03-02 DIAGNOSIS — J45909 Unspecified asthma, uncomplicated: Secondary | ICD-10-CM | POA: Diagnosis not present

## 2017-03-02 DIAGNOSIS — Z79899 Other long term (current) drug therapy: Secondary | ICD-10-CM | POA: Insufficient documentation

## 2017-03-02 MED ORDER — OXYMETAZOLINE HCL 0.05 % NA SOLN
1.0000 | Freq: Once | NASAL | Status: AC
  Administered 2017-03-02: 1 via NASAL

## 2017-03-02 MED ORDER — OXYMETAZOLINE HCL 0.05 % NA SOLN
NASAL | Status: AC
  Filled 2017-03-02: qty 15

## 2017-03-02 NOTE — ED Triage Notes (Signed)
Pt states nose bleed for the past hour. Pt has had several visits to ER & to ENT about bleeding.

## 2017-03-02 NOTE — ED Notes (Signed)
Pt alert & oriented x4, stable gait. Patient given discharge instructions, paperwork & prescription(s). Patient  instructed to stop at the registration desk to finish any additional paperwork. Patient verbalized understanding. Pt left department w/ no further questions. 

## 2017-03-02 NOTE — ED Provider Notes (Signed)
[facility] EMERGENCY DEPARTMENT Provider Note   CSN: [id number] Arrival date & time: 03/02/17  0201     History   Chief Complaint Chief Complaint  Patient presents with  . Epistaxis    HPI [name] [name] is a 76 y.o. male.  The history is provided by the patient.  Epistaxis   This is a new problem. The current episode started less than 1 hour ago. The problem occurs constantly. The problem has been resolved. The bleeding has been from both nares. His past medical history is significant for frequent nosebleeds.    Past Medical History:  Diagnosis Date  . AAA (abdominal aortic aneurysm) ([place])    needs yearly ultrasound  . Allergy   . Anemia   . Arthritis   . Asthma   . BPH (benign prostatic hyperplasia)   . CAD (coronary artery disease)   . Cancer ([place])    skin cancer  . COPD (chronic obstructive pulmonary disease) ([place])   . Dysrhythmia    pt. states it can be fast at times  . GERD (gastroesophageal reflux disease)   . Glaucoma   . HOH (hard of hearing)   . Hypercholesterolemia   . Hypertension   . Impaired fasting glucose   . Low back pain   . MI (myocardial infarction) ([place]) 1999  . Thrush     Patient Active Problem List   Diagnosis Date Noted  . CAP (community acquired pneumonia) 09/16/2016  . Pleuritic chest pain 09/16/2016  . HTN (hypertension) 09/16/2016  . Herpes zoster without complication 68/04/7516  . Oral candidiasis 04/01/2016  . Prostate hypertrophy 03/06/2016  . Depression 04/06/2015  . Ulnar neuropathy 12/14/2014  . Leg swelling 12/05/2014  . Neuropathic pain 12/05/2014  . S/P spinal surgery 12/05/2014  . Venous stasis dermatitis 12/05/2014  . Insomnia 12/05/2014  . Generalized pain   . UTI (lower urinary tract infection) 11/11/2014  . Thrush of mouth and esophagus ([place]) 11/11/2014  . Urinary retention   . Urinary tract infectious disease   . Lumbar spondylosis 11/05/2014  . Colon neoplasm 06/11/2013  . GERD (gastroesophageal  reflux disease) 01/19/2012  . PAC (premature atrial contraction) 09/15/2010  . ELEVATED BP READING WITHOUT DX HYPERTENSION 05/29/2009  . Mixed hyperlipidemia 04/15/2008  . BEN HTN HEART DISEASE WITHOUT HEART FAIL 04/15/2008  . Coronary atherosclerosis 04/15/2008  . Abdominal aortic aneurysm ([place]) 04/15/2008  . COPD (chronic obstructive pulmonary disease) ([place]) 04/15/2008    Past Surgical History:  Procedure Laterality Date  . BACK SURGERY     x 3  . CARDIAC CATHETERIZATION     angioplasty  . CATARACT EXTRACTION W/PHACO  03/20/2012   Procedure: CATARACT EXTRACTION PHACO AND INTRAOCULAR LENS PLACEMENT (IOC);  Surgeon: Williams Che, MD;  Location: [facility];  Service: Ophthalmology;  Laterality: Right;  CDE:  8.45  . CATARACT EXTRACTION W/PHACO Left 04/02/2013   Procedure: CATARACT EXTRACTION PHACO AND INTRAOCULAR LENS PLACEMENT (IOC);  Surgeon: Williams Che, MD;  Location: [facility];  Service: Ophthalmology;  Laterality: Left;  CDE:  6.50  . CHOLECYSTECTOMY  2000  . COLONOSCOPY  2009   repeat 5 years  . ESOPHAGOGASTRODUODENOSCOPY    . HERNIA REPAIR Left    inguinal  . LAPAROSCOPIC PARTIAL COLECTOMY N/A 06/11/2013   Procedure: LAPAROSCOPIC HAND ASSISTED PARTIAL COLECTOMY;  Surgeon: Jamesetta So, MD;  Location: [facility];  Service: General;  Laterality: N/A;  . right eye detached retina Bilateral   . YAG LASER APPLICATION Left 00/17/4944   Procedure:  YAG LASER APPLICATION;  Surgeon: Williams Che, MD;  Location: [facility];  Service: Ophthalmology;  Laterality: Left;       Home Medications    Prior to Admission medications   Medication Sig Start Date End Date Taking? Authorizing Provider  albuterol (PROVENTIL) (2.5 MG/3ML) 0.083% nebulizer solution INHALE 1 VIAL VIA NEBULIZER FOUR TIMES DAILY. 01/25/17  Yes Mikey Kirschner, MD  aspirin EC 81 MG tablet Take 81 mg by mouth daily.   Yes [provider]  cetirizine (ZYRTEC) 10 MG tablet Take 10 mg by mouth daily.   Yes  [provider]  esomeprazole (NEXIUM) 20 MG capsule Take 20 mg by mouth daily at 12 noon.   Yes [provider]  FLOVENT HFA 220 MCG/ACT inhaler INHALE 2 PUFFS INTO LUNGS 2 TIMES DAILY. 12/09/16  Yes Mikey Kirschner, MD  losartan (COZAAR) 25 MG tablet TAKE ONE TABLET BY MOUTH ONCE DAILY. 11/08/16  Yes Josue Hector, MD  lovastatin (MEVACOR) 40 MG tablet TAKE 2 TABLETS BY MOUTH DAILY. Patient taking differently: Patient takes 1 tablet once a day 12/23/16  Yes Mikey Kirschner, MD  montelukast (SINGULAIR) 10 MG tablet TAKE ONE TABLET BY MOUTH AT BEDTIME AS NEEDED FOR ALLERGIES. 09/02/16  Yes Mikey Kirschner, MD  nitroGLYCERIN (NITROSTAT) 0.4 MG SL tablet Place 1 tablet (0.4 mg total) under the tongue every 5 (five) minutes as needed. 01/20/16  Yes Josue Hector, MD    Family History Family History  Problem Relation Age of Onset  . Hypertension Mother   . COPD Father   . Cancer Brother        brain    Social History Social History  Substance Use Topics  . Smoking status: Former Smoker    Packs/day: 1.00    Years: 35.00    Types: Cigarettes    Quit date: 03/28/2003  . Smokeless tobacco: Never Used  . Alcohol use No     Allergies   Lasix [furosemide]; Cefzil [cefprozil]; Dexamethasone; Gabapentin; Neomycin; Tetracyclines & related; Methocarbamol; and Penicillins   Review of Systems Review of Systems  Constitutional: Negative for fever.  HENT: Positive for nosebleeds.   Gastrointestinal: Negative for vomiting.  Neurological: Negative for dizziness.     Physical Exam Updated Vital Signs BP 138/70 (BP Location: Right Arm)   Pulse 74   Temp 97.9 F (36.6 C) (Oral)   Resp 18   Ht 1.829 m (6')   Wt 95.3 kg (210 lb)   SpO2 96%   BMI 28.48 kg/m   Physical Exam CONSTITUTIONAL: Well developed/well nourished HEAD: Normocephalic/atraumatic EYES: EOMI/PERRL ENMT: Mucous membranes moist, dried blood in right nare, no active bleed.  No blood in left  nare.  Posterior oropharynx is clear NECK: supple no meningeal signs CV: S1/S2 noted, no murmurs/rubs/gallops noted LUNGS: Lungs are clear to auscultation bilaterally, no apparent distress ABDOMEN: soft, nontender  NEURO: Pt is awake/alert/appropriate, moves all extremitiesx4.  No facial droop.   EXTREMITIES: pulses normal/equal, full ROM SKIN: warm, color normal PSYCH: no abnormalities of mood noted, alert and oriented to situation   ED Treatments / Results  Labs (all labs ordered are listed, but only abnormal results are displayed) Labs Reviewed - No data to display  EKG  EKG Interpretation None       Radiology No results found.  Procedures Procedures (including critical care time)  Medications Ordered in ED Medications  oxymetazoline (AFRIN) 0.05 % nasal spray 1 spray (1 spray Each Nare Given 03/02/17 0257)  Initial Impression / Assessment and Plan / ED Course  I have reviewed the triage vital signs and the nursing notes.      Pt stable No active bleeding here Pt has had this previously This appeared to be anterior bleed that resolved Discharge home  Final Clinical Impressions(s) / ED Diagnoses   Final diagnoses:  Epistaxis    New Prescriptions New Prescriptions   No medications on file     Ripley Fraise, MD 03/02/17 [phone]

## 2017-03-11 ENCOUNTER — Encounter: Payer: Self-pay | Admitting: Family Medicine

## 2017-03-11 ENCOUNTER — Ambulatory Visit (INDEPENDENT_AMBULATORY_CARE_PROVIDER_SITE_OTHER): Payer: Medicare Other | Admitting: Family Medicine

## 2017-03-11 VITALS — BP 118/78 | Temp 98.1°F | Ht 72.0 in | Wt 216.0 lb

## 2017-03-11 DIAGNOSIS — B354 Tinea corporis: Secondary | ICD-10-CM | POA: Diagnosis not present

## 2017-03-11 DIAGNOSIS — R519 Headache, unspecified: Secondary | ICD-10-CM

## 2017-03-11 DIAGNOSIS — I2510 Atherosclerotic heart disease of native coronary artery without angina pectoris: Secondary | ICD-10-CM | POA: Diagnosis not present

## 2017-03-11 DIAGNOSIS — R51 Headache: Secondary | ICD-10-CM | POA: Diagnosis not present

## 2017-03-11 MED ORDER — KETOCONAZOLE 2 % EX CREA: 1.0000 "application " | TOPICAL_CREAM | Freq: Two times a day (BID) | CUTANEOUS | 4 refills | Status: DC

## 2017-03-11 NOTE — Progress Notes (Signed)
   Subjective:    Patient ID: [name] [name], male    DOB: 1941-03-13, 76 y.o.   MRN: [id number]  HPIPain, itching and burning behind both ears. Started yesterday. Tried benadryl, cortizone and witch hazel.  Intermittent ear discomfort behind the ear specifically tenderness in the skin some soreness denies fever chills sweats denies drainage Denies headaches no head congestion or chest congestion no vomiting or diarrhea or blurred vision Patient states it started last evening as a burning sensation behind the ears then progressed to having pain and discomfort in the scalp but no lesions he does wear hearing aids and glasses PMH benign  Review of Systems Please see above for review of systems   He does relate some intermittent nasal bleeds on the right side has seen ENT for this does use saline nasal sprays and a humidifier Objective:   Physical Exam Lungs clear no crackles heart regular pulse normal eardrums normal Nasal septum has a little bit of dried crusted area but no active bleeding The area behind the ears have his reddened area some scalp tenderness on the right side temporal region not tender     Assessment & Plan:  Nosebleed use saline nasal spray use in a fire follow-up if ongoing troubles as Low likelihood of temporal arteritis but we will check a sed rate Nizoral cream for possible tinea right behind the ears If progressively worse or other problems follow-up immediately

## 2017-03-12 LAB — SEDIMENTATION RATE: Sed Rate: 35 mm/hr — ABNORMAL HIGH (ref 0–30)

## 2017-03-22 ENCOUNTER — Encounter: Payer: Self-pay | Admitting: Family Medicine

## 2017-03-22 ENCOUNTER — Ambulatory Visit (HOSPITAL_COMMUNITY)
Admission: RE | Admit: 2017-03-22 | Discharge: 2017-03-22 | Disposition: A | Discharge status: Home / Self Care | Payer: Medicare Other | Source: Ambulatory Visit | Attending: Family Medicine | Admitting: Family Medicine

## 2017-03-22 ENCOUNTER — Ambulatory Visit (INDEPENDENT_AMBULATORY_CARE_PROVIDER_SITE_OTHER): Payer: Medicare Other | Admitting: Family Medicine

## 2017-03-22 VITALS — BP 124/84 | Temp 98.2°F | Ht 73.0 in | Wt 217.5 lb

## 2017-03-22 DIAGNOSIS — R05 Cough: Secondary | ICD-10-CM | POA: Insufficient documentation

## 2017-03-22 DIAGNOSIS — J029 Acute pharyngitis, unspecified: Secondary | ICD-10-CM

## 2017-03-22 DIAGNOSIS — R079 Chest pain, unspecified: Secondary | ICD-10-CM | POA: Diagnosis not present

## 2017-03-22 DIAGNOSIS — R059 Cough, unspecified: Secondary | ICD-10-CM

## 2017-03-22 DIAGNOSIS — I700 Atherosclerosis of aorta: Secondary | ICD-10-CM | POA: Insufficient documentation

## 2017-03-22 DIAGNOSIS — I2510 Atherosclerotic heart disease of native coronary artery without angina pectoris: Secondary | ICD-10-CM

## 2017-03-22 LAB — POCT RAPID STREP A (OFFICE): Rapid Strep A Screen: NEGATIVE

## 2017-03-22 NOTE — Progress Notes (Signed)
   Subjective:    Patient ID: [name] [name], male    DOB: 1940/12/19, 76 y.o.   MRN: [id number] Patient presents with numerous concerns Sore Throat   This is a new problem. The current episode started in the past 7 days. Associated symptoms comments: Runny nose.   Started sat, sore throat, getting progressively worse  Pt notes irritation in the throat Notes chest pain.  Sharp.  Right-sided.  Worse when laying on that side.  Worse with her cough.  Worried a bit because pneumonia in the past had some chest discomfort.  Also worried about chest pain in general. Trouble with ain on left side of the chest and hurt when he take a breath  Voice a bit hoarse  Having nose bleed this morn   No fever or chills   sligt chills   occas cough, not a lot, some nasaldranage and not a lot  Also has concerns of nose bleed. Patient states has had frequent nose bleeds throughout the year and had one this morning before arrival.  Review of Systems No headache, no major weight loss or weight gain, no chest pain no back pain abdominal pain no change in bowel habits complete ROS otherwise negative     Objective:   Physical Exam  Alert and oriented, vitals reviewed and stable, NAD ENT-TM's and ext canals WNL bilat via otoscopic exam Soft palate, tonsils and post pharynx WNL via oropharyngeal exam Neck-symmetric, no masses; thyroid nonpalpable and nontender Pulmonary-no tachypnea or accessory muscle use; Clear without wheezes via auscultation Card--no abnrml murmurs, rhythm reg and rate WNL Carotid pulses symmetric, without bruits Right anterior chest wall some slight tenderness to palpation  Impression viral syndrome URI with some hoarseness discussed.  Recommend symptom care at this time 2.  Recurrent epistaxis discussed.  Followed by ENT.  3.  Chest pain with worry regarding.  Discussed we will do chest x-ray.  Addendum chest x-ray no acute illness.  Patient reassurance attempted  Greater than  50% of this 25 minute face to face visit was spent in counseling and discussion and coordination of care regarding the above diagnosis/diagnosies       Assessment & Plan:

## 2017-03-23 LAB — STREP A DNA PROBE: Strep Gp A Direct, DNA Probe: NEGATIVE

## 2017-04-19 ENCOUNTER — Encounter: Payer: Self-pay | Admitting: Nurse Practitioner

## 2017-04-19 ENCOUNTER — Ambulatory Visit (INDEPENDENT_AMBULATORY_CARE_PROVIDER_SITE_OTHER): Payer: Medicare Other | Admitting: Nurse Practitioner

## 2017-04-19 VITALS — BP 136/86 | Temp 98.0°F | Ht 73.0 in | Wt 213.6 lb

## 2017-04-19 DIAGNOSIS — R040 Epistaxis: Secondary | ICD-10-CM

## 2017-04-19 DIAGNOSIS — I2510 Atherosclerotic heart disease of native coronary artery without angina pectoris: Secondary | ICD-10-CM

## 2017-04-19 DIAGNOSIS — J0110 Acute frontal sinusitis, unspecified: Secondary | ICD-10-CM | POA: Diagnosis not present

## 2017-04-19 MED ORDER — MUPIROCIN CALCIUM 2 % EX CREA: 1.0000 "application " | TOPICAL_CREAM | Freq: Two times a day (BID) | CUTANEOUS | 0 refills | Status: DC

## 2017-04-19 MED ORDER — SULFAMETHOXAZOLE-TRIMETHOPRIM 800-160 MG PO TABS: 1.0000 | ORAL_TABLET | Freq: Two times a day (BID) | ORAL | 0 refills | Status: DC

## 2017-04-19 NOTE — Progress Notes (Signed)
Subjective:  Presents for c/o sinus symptoms over the past week. PND. Irritation right nasal cavitiy. Scratchy throat. Has had multiple nosebleeds over the past few months. Has been followed by Dr. Benjamine Mola. One nosebleed with current illness 2 days ago. No ear pain. Frontal area headache. No fever. No change in his cough. No wheezing. Uses saline nasal spray several times per day.   Objective:   BP 136/86   Temp 98 F (36.7 C) (Oral)   Ht 6\' 1"  (1.854 m)   Wt 213 lb 9.6 oz (96.9 kg)   BMI 28.18 kg/m  NAD. Alert, oriented. TMs cloudy effusion. Pharynx posterior mild erythema with PND noted. Nasal mucosa right side erythema along the septum with white superficial lesion. Left side clear. Neck supple with mild anterior adenopathy. Lungs clear. Heart RRR.   Assessment:  Acute non-recurrent frontal sinusitis  Recurrent epistaxis    Plan:   Meds ordered this encounter  Medications  . sulfamethoxazole-trimethoprim (BACTRIM DS,SEPTRA DS) 800-160 MG tablet    Sig: Take 1 tablet by mouth 2 (two) times daily.    Dispense:  14 tablet    Refill:  0    Order Specific Question:   Supervising Provider    Answer:   Mikey Kirschner [[id number]]  . mupirocin cream (BACTROBAN) 2 %    Sig: Apply 1 application topically 2 (two) times daily.    Dispense:  15 g    Refill:  0    Order Specific Question:   Supervising Provider    Answer:   Mikey Kirschner [[id number]]   OTC meds as directed for congestion. Continue saline nasal spray followed by Bactroban BID x 5-7 d. Cool mist humidifier. Call back if worsens or persists.

## 2017-04-21 ENCOUNTER — Other Ambulatory Visit: Payer: Self-pay

## 2017-04-21 ENCOUNTER — Ambulatory Visit (INDEPENDENT_AMBULATORY_CARE_PROVIDER_SITE_OTHER): Payer: Medicare Other | Admitting: Otolaryngology

## 2017-04-26 ENCOUNTER — Encounter: Payer: Self-pay | Admitting: Family Medicine

## 2017-04-26 ENCOUNTER — Ambulatory Visit (INDEPENDENT_AMBULATORY_CARE_PROVIDER_SITE_OTHER): Payer: Medicare Other | Admitting: Family Medicine

## 2017-04-26 VITALS — BP 108/70 | Temp 98.9°F | Ht 73.0 in | Wt 212.0 lb

## 2017-04-26 DIAGNOSIS — R1030 Lower abdominal pain, unspecified: Secondary | ICD-10-CM | POA: Diagnosis not present

## 2017-04-26 DIAGNOSIS — R040 Epistaxis: Secondary | ICD-10-CM | POA: Diagnosis not present

## 2017-04-26 DIAGNOSIS — I2510 Atherosclerotic heart disease of native coronary artery without angina pectoris: Secondary | ICD-10-CM

## 2017-04-26 NOTE — Progress Notes (Signed)
   Subjective:    Patient ID: [name] [name], male    DOB: 1940-06-12, 76 y.o.   MRN: [id number] Patient arrives with numerous concerns Abdominal Pain  This is a new problem. Episode onset: 4 days. Associated symptoms include diarrhea and nausea. Treatments tried: zantac, pepto bismol.   Quit bactrim ds and bactroban yesterday due to abdominal pain and diarrhea.   Abdominal craming and hurting, seem to be triggered by the antibiotic.  Somewhat improved now.  Pain lower abdomen.  Cramping intermittently.  Stools at times on the loose side.  Very mild nausea but appetite still.  Patient notes pretty substantial stress right now.  Wife just diagnosed with metastatic pancreatic cancer unfortunately.  Ongoing challenges with nosebleeds.  Patient states he no longer has confidence in his ENT physician would like him to see another ENT  ENT referral    Review of Systems  Gastrointestinal: Positive for abdominal pain, diarrhea and nausea.       Objective:   Physical Exam Alert and oriented, vitals reviewed and stable, NAD ENT-TM's and ext canals WNL bilat via otoscopic exam Soft palate, tonsils and post pharynx WNL via oropharyngeal exam Neck-symmetric, no masses; thyroid nonpalpable and nontender Pulmonary-no tachypnea or accessory muscle use; Clear without wheezes via auscultation Card--no abnrml murmurs, rhythm reg and rate WNL Carotid pulses symmetric, without bruits Low abdomen diffuse and very mild discomfort to palpation no rebound no guarding no point tenderness bowel sounds excellent abdomen soft  Right nostril region site of recent nasal bleed seen with disruption of mucosa       Assessment & Plan:  Impression 1 recurrent nosebleeds discussed.  Will work on additional ENT referral.  2.  Abdominal pain transient possibly antibiotic induced.  Also possibly stress-induced  3.  Increased stress discussed aggressive serious cancer discussion held.  Greater than 50% of this  25 minute face to face visit was spent in counseling and discussion and coordination of care regarding the above diagnosis/diagnosies  Elect to hold off on medications at this time.  Elect to hold off on major abdominal workup at this time.  Discussed.  Warning signs discussed carefully.  ENT referral rationale discussed

## 2017-04-28 ENCOUNTER — Telehealth: Payer: Self-pay | Admitting: Family Medicine

## 2017-04-28 NOTE — Telephone Encounter (Signed)
May add otc immodium up to three times per day for the next wk, then back off to as needed, if abd pain worsens call us back

## 2017-04-28 NOTE — Telephone Encounter (Signed)
Pt was seen on Tuesday and was told to call and let the Dr know how he was doing. Pt states that there has been no change other than an increase in the diarrhea. Pt still has abd pain and nausea. Pt is also needing a refill on  albuterol (PROVENTIL HFA;VENTOLIN HFA) 108 (90 BASE) MCG/ACT inhaler  Please advise.    [facility]

## 2017-04-28 NOTE — Telephone Encounter (Signed)
Patient is aware 

## 2017-04-28 NOTE — Telephone Encounter (Signed)
Seen 04/26/17. Abdominal pain is not as bad. Diarrhea is worse. 3 -4 times a day. No blood or mucus. No fever. Some nausea.

## 2017-05-02 DIAGNOSIS — K580 Irritable bowel syndrome with diarrhea: Secondary | ICD-10-CM | POA: Diagnosis not present

## 2017-05-02 DIAGNOSIS — R197 Diarrhea, unspecified: Secondary | ICD-10-CM | POA: Diagnosis not present

## 2017-05-03 ENCOUNTER — Encounter: Payer: Self-pay | Admitting: Family Medicine

## 2017-05-11 ENCOUNTER — Other Ambulatory Visit: Payer: Self-pay | Admitting: Cardiovascular Disease

## 2017-05-11 DIAGNOSIS — R197 Diarrhea, unspecified: Secondary | ICD-10-CM | POA: Diagnosis not present

## 2017-05-18 ENCOUNTER — Ambulatory Visit (INDEPENDENT_AMBULATORY_CARE_PROVIDER_SITE_OTHER): Payer: Medicare Other | Admitting: Family Medicine

## 2017-05-18 ENCOUNTER — Encounter: Payer: Self-pay | Admitting: Family Medicine

## 2017-05-18 VITALS — BP 134/76 | Temp 98.4°F | Ht 73.0 in | Wt 210.0 lb

## 2017-05-18 DIAGNOSIS — N419 Inflammatory disease of prostate, unspecified: Secondary | ICD-10-CM | POA: Diagnosis not present

## 2017-05-18 DIAGNOSIS — R300 Dysuria: Secondary | ICD-10-CM | POA: Diagnosis not present

## 2017-05-18 MED ORDER — CIPROFLOXACIN HCL 500 MG PO TABS: ORAL_TABLET | ORAL | 0 refills | Status: DC

## 2017-05-18 MED ORDER — TAMSULOSIN HCL 0.4 MG PO CAPS: ORAL_CAPSULE | ORAL | 5 refills | Status: DC

## 2017-05-18 NOTE — Progress Notes (Signed)
   Subjective:    Patient ID: [name] [name], male    DOB: May 23, 1940, 77 y.o.   MRN: [id number]  Dysuria   This is a new problem. Episode onset: one week  Associated symptoms comments: Low back pain. He has tried nothing for the symptoms.   More trouble passing urine lately  Some dysuria  Urin flow tnot the best  Could not give uie nspecimen   cipro  Bid plsu     Diffuse lower abdominal and back discomfort.  Feels like the last time he had a prostate infection   Review of Systems  Genitourinary: Positive for dysuria.       Objective:   Physical Exam  Alert vitals stable, NAD. Blood pressure good on repeat. HEENT normal. Lungs clear. Heart regular rate and rhythm. Prostate boggy tender to palpation  Impression acute prostate infection discussed.  Cipro prescribed.  Symptom care discussed.  Also add Flomax.  Expect slow resolution      Assessment & Plan:

## 2017-05-19 ENCOUNTER — Telehealth: Payer: Self-pay | Admitting: Family Medicine

## 2017-05-19 MED ORDER — SULFAMETHOXAZOLE-TRIMETHOPRIM 800-160 MG PO TABS: 1.0000 | ORAL_TABLET | Freq: Two times a day (BID) | ORAL | 0 refills | Status: DC

## 2017-05-19 NOTE — Telephone Encounter (Signed)
Patient said that the Cipro he was prescribed yesterday by Dr. Richardson Landry for prostatitis has caused his joints to hurt and fingers numb.  He is requesting another antibiotic called in.

## 2017-05-19 NOTE — Telephone Encounter (Signed)
Bactrim ds bid 21 d/document cipro intol in allergies

## 2017-05-19 NOTE — Telephone Encounter (Signed)
Please advise 

## 2017-05-19 NOTE — Telephone Encounter (Signed)
Tried calling number busy. I put in the intolerance of the antibx.Sent in the new rx.

## 2017-05-19 NOTE — Telephone Encounter (Signed)
[name] [name] is aware.

## 2017-05-23 ENCOUNTER — Telehealth: Payer: Self-pay | Admitting: Family Medicine

## 2017-05-23 MED ORDER — LORAZEPAM 1 MG PO TABS: ORAL_TABLET | ORAL | 0 refills | Status: DC

## 2017-05-23 NOTE — Telephone Encounter (Signed)
Lorazepam 1 mg numb 30 one up to tid prn nerves

## 2017-05-23 NOTE — Telephone Encounter (Signed)
Awaiting signature.

## 2017-05-23 NOTE — Telephone Encounter (Signed)
Patient's wife passed on yesterday and needing something called in for his nerves to [place]

## 2017-05-23 NOTE — Telephone Encounter (Signed)
Script faxed to pharm. Pt notified.  

## 2017-05-29 ENCOUNTER — Other Ambulatory Visit: Payer: Self-pay | Admitting: Family Medicine

## 2017-05-30 ENCOUNTER — Ambulatory Visit: Payer: Medicare Other | Admitting: Family Medicine

## 2017-06-01 DIAGNOSIS — R040 Epistaxis: Secondary | ICD-10-CM | POA: Insufficient documentation

## 2017-06-02 ENCOUNTER — Other Ambulatory Visit: Payer: Self-pay | Admitting: Gastroenterology

## 2017-06-02 DIAGNOSIS — R109 Unspecified abdominal pain: Secondary | ICD-10-CM | POA: Diagnosis not present

## 2017-06-02 DIAGNOSIS — R1011 Right upper quadrant pain: Secondary | ICD-10-CM

## 2017-06-03 DIAGNOSIS — H33053 Total retinal detachment, bilateral: Secondary | ICD-10-CM | POA: Diagnosis not present

## 2017-06-03 DIAGNOSIS — H31092 Other chorioretinal scars, left eye: Secondary | ICD-10-CM | POA: Diagnosis not present

## 2017-06-03 DIAGNOSIS — Z961 Presence of intraocular lens: Secondary | ICD-10-CM | POA: Diagnosis not present

## 2017-06-03 DIAGNOSIS — H40013 Open angle with borderline findings, low risk, bilateral: Secondary | ICD-10-CM | POA: Diagnosis not present

## 2017-06-03 DIAGNOSIS — H16223 Keratoconjunctivitis sicca, not specified as Sjogren's, bilateral: Secondary | ICD-10-CM | POA: Diagnosis not present

## 2017-06-06 ENCOUNTER — Ambulatory Visit (INDEPENDENT_AMBULATORY_CARE_PROVIDER_SITE_OTHER): Payer: Medicare Other | Admitting: Family Medicine

## 2017-06-06 ENCOUNTER — Encounter: Payer: Self-pay | Admitting: Family Medicine

## 2017-06-06 VITALS — BP 117/84 | Temp 98.8°F | Ht 73.0 in | Wt 207.0 lb

## 2017-06-06 DIAGNOSIS — E782 Mixed hyperlipidemia: Secondary | ICD-10-CM | POA: Diagnosis not present

## 2017-06-06 DIAGNOSIS — J410 Simple chronic bronchitis: Secondary | ICD-10-CM

## 2017-06-06 DIAGNOSIS — R1030 Lower abdominal pain, unspecified: Secondary | ICD-10-CM

## 2017-06-06 DIAGNOSIS — N419 Inflammatory disease of prostate, unspecified: Secondary | ICD-10-CM

## 2017-06-06 DIAGNOSIS — R3915 Urgency of urination: Secondary | ICD-10-CM

## 2017-06-06 DIAGNOSIS — Z79899 Other long term (current) drug therapy: Secondary | ICD-10-CM | POA: Diagnosis not present

## 2017-06-06 DIAGNOSIS — N400 Enlarged prostate without lower urinary tract symptoms: Secondary | ICD-10-CM | POA: Diagnosis not present

## 2017-06-06 LAB — POCT URINALYSIS DIPSTICK
Spec Grav, UA: 1.015 (ref 1.010–1.025)
pH, UA: 7 (ref 5.0–8.0)

## 2017-06-06 MED ORDER — MONTELUKAST SODIUM 10 MG PO TABS: ORAL_TABLET | ORAL | 5 refills | Status: DC

## 2017-06-06 MED ORDER — ALBUTEROL SULFATE HFA 108 (90 BASE) MCG/ACT IN AERS: 2.0000 | INHALATION_SPRAY | Freq: Four times a day (QID) | RESPIRATORY_TRACT | 5 refills | Status: DC | PRN

## 2017-06-06 MED ORDER — LORAZEPAM 1 MG PO TABS: ORAL_TABLET | ORAL | 3 refills | Status: DC

## 2017-06-06 MED ORDER — LOVASTATIN 40 MG PO TABS: 80.0000 mg | ORAL_TABLET | Freq: Every day | ORAL | 5 refills | Status: DC

## 2017-06-06 MED ORDER — TAMSULOSIN HCL 0.4 MG PO CAPS: ORAL_CAPSULE | ORAL | 5 refills | Status: DC

## 2017-06-06 NOTE — Progress Notes (Signed)
   Subjective:    Patient ID: [name] [name], male    DOB: 1940-12-29, 77 y.o.   MRN: [id number]  Hyperlipidemia  This is a chronic problem. Compliance problems include adherence to diet and adherence to exercise.    Trouble urinating. Taking bactrim.   Results for orders placed or performed in visit on 06/06/17  POCT urinalysis dipstick  Result Value Ref Range   Color, UA     Clarity, UA     Glucose, UA     Bilirubin, UA ++    Ketones, UA     Spec Grav, UA 1.015 1.010 - 1.025   Blood, UA     pH, UA 7.0 5.0 - 8.0   Protein, UA     Urobilinogen, UA  0.2 or 1.0 E.U./dL   Nitrite, UA     Leukocytes, UA  Negative   Appearance     Odor     Patient continues to take lipid medication regularly. No obvious side effects from it. Generally does not miss a dose. Prior blood work results are reviewed with patient. Patient continues to work on fat intake in diet  incr urin freq, with soe ow back discomfort  ompliant with the bactirm      Having trouble sleeping.  Recently lost his spouse.  Using the Ativan.  He feels it helps calm him down.  Notes no negative side effects from this.  Low back pain worse with movement.  Did do a lot of lifting to help his wife.  Primarily numbers with minimal radiation.    Sharp catching pain , lasted fifteen seconds ruq, at the funeral, hit  Saw the GI specualist  u s scheduled for tomorrow   Dull aching pain and ds=iscomfor t, u s to   Review of Systems No headache, no major weight loss or weight gain, no chest pain no back pain abdominal pain no change in bowel habits complete ROS otherwise negative     Objective:   Physical Exam  Alert and oriented, vitals reviewed and stable, NAD ENT-TM's and ext canals WNL bilat via otoscopic exam Soft palate, tonsils and post pharynx WNL via oropharyngeal exam Neck-symmetric, no masses; thyroid nonpalpable and nontender Pulmonary-no tachypnea or accessory muscle use; Clear without wheezes via  auscultation Card--no abnrml murmurs, rhythm reg and rate WNL Carotid pulses symmetric, without bruits Positive paralumbar tenderness to deep palpation abdominal exam diffuse mild tenderness no point tenderness no rebound no guarding good bowel sounds      Assessment & Plan:  Impression 1 lumbar pain likely muscle strain discussed  2.  Progressive insomnia related to #3 discussed patient would like to maintain same medicine  3.  Hyperlipidemia.  Good control prior lipid results discussed blood work discussed  4.  Grief.  Recent loss of spouse Long discussion held  5.  Urinary symptoms ongoing.  Urinalysis completely normal.  Encouraged to finish antibiotics   6.  Hypertension good control discussed maintain same meds  7.  Asthma clinically stable to maintain same meds40 Greater than 50% of this 40 minute face to face visit was spent in counseling and discussion and coordination of care regarding the above diagnosis/diagnosies

## 2017-06-07 ENCOUNTER — Ambulatory Visit
Admission: RE | Admit: 2017-06-07 | Discharge: 2017-06-07 | Disposition: A | Discharge status: Home / Self Care | Payer: Medicare Other | Source: Ambulatory Visit | Attending: Gastroenterology | Admitting: Gastroenterology

## 2017-06-07 DIAGNOSIS — R1011 Right upper quadrant pain: Secondary | ICD-10-CM | POA: Diagnosis not present

## 2017-06-09 ENCOUNTER — Telehealth: Payer: Self-pay | Admitting: Family Medicine

## 2017-06-09 NOTE — Telephone Encounter (Signed)
Review US Abdomen Complete results from [place] in results folder.

## 2017-06-22 ENCOUNTER — Telehealth: Payer: Self-pay | Admitting: Family Medicine

## 2017-06-22 ENCOUNTER — Other Ambulatory Visit (HOSPITAL_COMMUNITY): Payer: Self-pay | Admitting: Physician Assistant

## 2017-06-22 DIAGNOSIS — R109 Unspecified abdominal pain: Secondary | ICD-10-CM

## 2017-06-22 DIAGNOSIS — E785 Hyperlipidemia, unspecified: Secondary | ICD-10-CM

## 2017-06-22 DIAGNOSIS — R140 Abdominal distension (gaseous): Secondary | ICD-10-CM

## 2017-06-22 DIAGNOSIS — R1084 Generalized abdominal pain: Secondary | ICD-10-CM

## 2017-06-22 DIAGNOSIS — K580 Irritable bowel syndrome with diarrhea: Secondary | ICD-10-CM | POA: Diagnosis not present

## 2017-06-22 NOTE — Telephone Encounter (Signed)
Please advise 

## 2017-06-22 NOTE — Telephone Encounter (Signed)
I would recommend redoing the lab orders to be the following CBC lipid, liver, metabolic 7, lipase with a diagnosis of abdominal pain hyperlipidemia

## 2017-06-22 NOTE — Telephone Encounter (Signed)
Lab orders put in. Pt notified.  

## 2017-06-22 NOTE — Telephone Encounter (Signed)
[name] [name] is wanting [place] to add a cbc,cmp,and lipase to the labs that we already ordered with a dx of abd pain (R14.0) pt is wanting to get all of his labs done tomorrow or Friday.

## 2017-06-23 DIAGNOSIS — E785 Hyperlipidemia, unspecified: Secondary | ICD-10-CM | POA: Diagnosis not present

## 2017-06-23 DIAGNOSIS — R109 Unspecified abdominal pain: Secondary | ICD-10-CM | POA: Diagnosis not present

## 2017-06-24 LAB — BASIC METABOLIC PANEL
BUN/Creatinine Ratio: 10 (ref 10–24)
BUN: 10 mg/dL (ref 8–27)
CO2: 26 mmol/L (ref 20–29)
Calcium: 9 mg/dL (ref 8.6–10.2)
Chloride: 105 mmol/L (ref 96–106)
Creatinine, Ser: 0.99 mg/dL (ref 0.76–1.27)
GFR calc Af Amer: 86 mL/min/{1.73_m2} (ref >59)
GFR calc non Af Amer: 74 mL/min/{1.73_m2} (ref >59)
Glucose: 106 mg/dL — ABNORMAL HIGH (ref 65–99)
Potassium: 4.5 mmol/L (ref 3.5–5.2)
Sodium: 144 mmol/L (ref 134–144)

## 2017-06-24 LAB — LIPID PANEL
Chol/HDL Ratio: 2.8 ratio (ref 0.0–5.0)
Cholesterol, Total: 122 mg/dL (ref 100–199)
HDL: 43 mg/dL (ref >39)
LDL Calculated: 67 mg/dL (ref 0–99)
Triglycerides: 61 mg/dL (ref 0–149)
VLDL Cholesterol Cal: 12 mg/dL (ref 5–40)

## 2017-06-24 LAB — CBC WITH DIFFERENTIAL/PLATELET
Basophils Absolute: 0.1 10*3/uL (ref 0.0–0.2)
Basos: 1 %
EOS (ABSOLUTE): 0.3 10*3/uL (ref 0.0–0.4)
Eos: 4 %
Hematocrit: 40.3 % (ref 37.5–51.0)
Hemoglobin: 12.9 g/dL — ABNORMAL LOW (ref 13.0–17.7)
Immature Grans (Abs): 0 10*3/uL (ref 0.0–0.1)
Immature Granulocytes: 0 %
Lymphocytes Absolute: 1.6 10*3/uL (ref 0.7–3.1)
Lymphs: 24 %
MCH: 26.7 pg (ref 26.6–33.0)
MCHC: 32 g/dL (ref 31.5–35.7)
MCV: 83 fL (ref 79–97)
Monocytes Absolute: 0.6 10*3/uL (ref 0.1–0.9)
Monocytes: 9 %
Neutrophils Absolute: 4.2 10*3/uL (ref 1.4–7.0)
Neutrophils: 62 %
Platelets: 212 10*3/uL (ref 150–379)
RBC: 4.84 x10E6/uL (ref 4.14–5.80)
RDW: 15.4 % (ref 12.3–15.4)
WBC: 6.7 10*3/uL (ref 3.4–10.8)

## 2017-06-24 LAB — HEPATIC FUNCTION PANEL
ALT: 20 IU/L (ref 0–44)
AST: 21 IU/L (ref 0–40)
Albumin: 4.1 g/dL (ref 3.5–4.8)
Alkaline Phosphatase: 64 IU/L (ref 39–117)
Bilirubin Total: 0.4 mg/dL (ref 0.0–1.2)
Bilirubin, Direct: 0.15 mg/dL (ref 0.00–0.40)
Total Protein: 6.8 g/dL (ref 6.0–8.5)

## 2017-06-24 LAB — LIPASE: Lipase: 35 U/L (ref 13–78)

## 2017-06-30 ENCOUNTER — Ambulatory Visit (HOSPITAL_COMMUNITY)
Admission: RE | Admit: 2017-06-30 | Discharge: 2017-06-30 | Disposition: A | Discharge status: Home / Self Care | Payer: Medicare Other | Source: Ambulatory Visit | Attending: Physician Assistant | Admitting: Physician Assistant

## 2017-06-30 DIAGNOSIS — R1031 Right lower quadrant pain: Secondary | ICD-10-CM | POA: Diagnosis not present

## 2017-06-30 DIAGNOSIS — K760 Fatty (change of) liver, not elsewhere classified: Secondary | ICD-10-CM | POA: Insufficient documentation

## 2017-06-30 DIAGNOSIS — I714 Abdominal aortic aneurysm, without rupture: Secondary | ICD-10-CM | POA: Diagnosis not present

## 2017-06-30 DIAGNOSIS — K4090 Unilateral inguinal hernia, without obstruction or gangrene, not specified as recurrent: Secondary | ICD-10-CM | POA: Diagnosis not present

## 2017-06-30 DIAGNOSIS — R140 Abdominal distension (gaseous): Secondary | ICD-10-CM | POA: Diagnosis not present

## 2017-06-30 DIAGNOSIS — K4020 Bilateral inguinal hernia, without obstruction or gangrene, not specified as recurrent: Secondary | ICD-10-CM | POA: Insufficient documentation

## 2017-06-30 DIAGNOSIS — R1084 Generalized abdominal pain: Secondary | ICD-10-CM | POA: Diagnosis not present

## 2017-06-30 DIAGNOSIS — K5730 Diverticulosis of large intestine without perforation or abscess without bleeding: Secondary | ICD-10-CM | POA: Diagnosis not present

## 2017-06-30 MED ORDER — IOPAMIDOL (ISOVUE-300) INJECTION 61%
100.0000 mL | Freq: Once | INTRAVENOUS | Status: AC | PRN
  Administered 2017-06-30: 100 mL via INTRAVENOUS

## 2017-07-18 DIAGNOSIS — K219 Gastro-esophageal reflux disease without esophagitis: Secondary | ICD-10-CM | POA: Diagnosis not present

## 2017-07-18 DIAGNOSIS — Z86010 Personal history of colonic polyps: Secondary | ICD-10-CM | POA: Diagnosis not present

## 2017-07-18 DIAGNOSIS — K580 Irritable bowel syndrome with diarrhea: Secondary | ICD-10-CM | POA: Diagnosis not present

## 2017-07-20 ENCOUNTER — Other Ambulatory Visit: Payer: Self-pay | Admitting: Dermatology

## 2017-07-20 DIAGNOSIS — L570 Actinic keratosis: Secondary | ICD-10-CM | POA: Diagnosis not present

## 2017-07-20 DIAGNOSIS — D485 Neoplasm of uncertain behavior of skin: Secondary | ICD-10-CM | POA: Diagnosis not present

## 2017-07-20 DIAGNOSIS — L578 Other skin changes due to chronic exposure to nonionizing radiation: Secondary | ICD-10-CM | POA: Diagnosis not present

## 2017-07-20 DIAGNOSIS — L820 Inflamed seborrheic keratosis: Secondary | ICD-10-CM | POA: Diagnosis not present

## 2017-07-20 DIAGNOSIS — D0462 Carcinoma in situ of skin of left upper limb, including shoulder: Secondary | ICD-10-CM | POA: Diagnosis not present

## 2017-07-26 ENCOUNTER — Other Ambulatory Visit: Payer: Self-pay | Admitting: Family Medicine

## 2017-08-18 ENCOUNTER — Other Ambulatory Visit: Payer: Self-pay | Admitting: Dermatology

## 2017-08-18 DIAGNOSIS — D0462 Carcinoma in situ of skin of left upper limb, including shoulder: Secondary | ICD-10-CM | POA: Diagnosis not present

## 2017-08-18 DIAGNOSIS — L570 Actinic keratosis: Secondary | ICD-10-CM | POA: Diagnosis not present

## 2017-08-18 DIAGNOSIS — D485 Neoplasm of uncertain behavior of skin: Secondary | ICD-10-CM | POA: Diagnosis not present

## 2017-09-14 DIAGNOSIS — I10 Essential (primary) hypertension: Secondary | ICD-10-CM | POA: Diagnosis not present

## 2017-09-14 DIAGNOSIS — Z6826 Body mass index (BMI) 26.0-26.9, adult: Secondary | ICD-10-CM | POA: Diagnosis not present

## 2017-09-14 DIAGNOSIS — R030 Elevated blood-pressure reading, without diagnosis of hypertension: Secondary | ICD-10-CM | POA: Diagnosis not present

## 2017-09-14 DIAGNOSIS — M5442 Lumbago with sciatica, left side: Secondary | ICD-10-CM | POA: Diagnosis not present

## 2017-10-19 ENCOUNTER — Other Ambulatory Visit: Payer: Self-pay | Admitting: Family Medicine

## 2017-10-20 NOTE — Telephone Encounter (Signed)
Six mo worth ok 

## 2017-11-07 ENCOUNTER — Other Ambulatory Visit: Payer: Self-pay | Admitting: Cardiovascular Disease

## 2017-11-15 DIAGNOSIS — L719 Rosacea, unspecified: Secondary | ICD-10-CM | POA: Diagnosis not present

## 2017-11-15 DIAGNOSIS — L570 Actinic keratosis: Secondary | ICD-10-CM | POA: Diagnosis not present

## 2017-11-21 ENCOUNTER — Other Ambulatory Visit: Payer: Self-pay | Admitting: Family Medicine

## 2017-11-30 DIAGNOSIS — H31092 Other chorioretinal scars, left eye: Secondary | ICD-10-CM | POA: Diagnosis not present

## 2017-11-30 DIAGNOSIS — H33053 Total retinal detachment, bilateral: Secondary | ICD-10-CM | POA: Diagnosis not present

## 2017-11-30 DIAGNOSIS — H40013 Open angle with borderline findings, low risk, bilateral: Secondary | ICD-10-CM | POA: Diagnosis not present

## 2017-11-30 DIAGNOSIS — H16223 Keratoconjunctivitis sicca, not specified as Sjogren's, bilateral: Secondary | ICD-10-CM | POA: Diagnosis not present

## 2017-11-30 DIAGNOSIS — Z961 Presence of intraocular lens: Secondary | ICD-10-CM | POA: Diagnosis not present

## 2017-12-13 ENCOUNTER — Ambulatory Visit (INDEPENDENT_AMBULATORY_CARE_PROVIDER_SITE_OTHER): Payer: Medicare Other | Admitting: Family Medicine

## 2017-12-13 ENCOUNTER — Encounter: Payer: Self-pay | Admitting: Family Medicine

## 2017-12-13 VITALS — BP 126/86 | Ht 73.0 in | Wt 203.6 lb

## 2017-12-13 DIAGNOSIS — R300 Dysuria: Secondary | ICD-10-CM | POA: Diagnosis not present

## 2017-12-13 DIAGNOSIS — N419 Inflammatory disease of prostate, unspecified: Secondary | ICD-10-CM | POA: Diagnosis not present

## 2017-12-13 LAB — POCT URINALYSIS DIPSTICK
Spec Grav, UA: 1.02 (ref 1.010–1.025)
pH, UA: 6 (ref 5.0–8.0)

## 2017-12-13 MED ORDER — SULFAMETHOXAZOLE-TRIMETHOPRIM 800-160 MG PO TABS: 1.0000 | ORAL_TABLET | Freq: Two times a day (BID) | ORAL | 0 refills | Status: DC

## 2017-12-13 NOTE — Progress Notes (Signed)
   Subjective:    Patient ID: [name] [name], male    DOB: 1940-09-18, 77 y.o.   MRN: [id number]  HPI  Patient arrives with prostate issues. Patient also has place on scrotum that is bleeding. Denies dysuria but does relate urinary frequency also relates having some spots of blood in his underwear that came from areas on the scrotum Also relates low back pain feeling bad felt this way previously when he had a prostate infection.  PMH benign  He relates pain discomfort in the right flank region.  Denies hematuria.  Does state that this pain is been going on the past couple weeks Second-degree as a deep ache Other times it hurts with movement Does not hurt to touch or press on it.  Has not had this before.  Does have history of back trouble and back surgery Review of Systems  Constitutional: Negative for activity change, appetite change and fatigue.  HENT: Negative for congestion and rhinorrhea.   Respiratory: Negative for cough and shortness of breath.   Cardiovascular: Negative for chest pain and leg swelling.  Gastrointestinal: Negative for abdominal pain, constipation, nausea and vomiting.  Genitourinary: Positive for flank pain and frequency.  Neurological: Negative for dizziness and headaches.  Psychiatric/Behavioral: Negative for agitation and behavioral problems.       Objective:   Physical Exam  Constitutional: He appears well-nourished. No distress.  HENT:  Head: Normocephalic and atraumatic.  Eyes: Right eye exhibits no discharge. Left eye exhibits no discharge.  Neck: No tracheal deviation present.  Cardiovascular: Normal rate, regular rhythm and normal heart sounds.  No murmur heard. Pulmonary/Chest: Effort normal and breath sounds normal. No respiratory distress.  Musculoskeletal: He exhibits no edema.  Lymphadenopathy:    He has no cervical adenopathy.  Neurological: He is alert. Coordination normal.  Skin: Skin is warm and dry.  Psychiatric: He has a normal mood  and affect. His behavior is normal.  Vitals reviewed.  Prostate mild enlargement with significant tenderness Urinalysis with WBCs no bacteria Scrotum has hemangiomas on the surface which were the source of bleeding       Assessment & Plan:  No bleeding currently We talked about ways to take care of the issue should he have this happen again  Prostate infection Bactrim DS twice daily for the course the next 3 weeks Avoid excessive sun if any rash or blistering.  Medication follow-up immediately  Right flank pain check lab work.  Follow-up in 3 weeks if ongoing troubles may need further testing.

## 2017-12-14 LAB — CBC WITH DIFFERENTIAL/PLATELET
Basophils Absolute: 0.1 10*3/uL (ref 0.0–0.2)
Basos: 1 %
EOS (ABSOLUTE): 0.3 10*3/uL (ref 0.0–0.4)
Eos: 3 %
Hematocrit: 40 % (ref 37.5–51.0)
Hemoglobin: 12.8 g/dL — ABNORMAL LOW (ref 13.0–17.7)
Immature Grans (Abs): 0 10*3/uL (ref 0.0–0.1)
Immature Granulocytes: 0 %
Lymphocytes Absolute: 1.6 10*3/uL (ref 0.7–3.1)
Lymphs: 16 %
MCH: 26.6 pg (ref 26.6–33.0)
MCHC: 32 g/dL (ref 31.5–35.7)
MCV: 83 fL (ref 79–97)
Monocytes Absolute: 0.8 10*3/uL (ref 0.1–0.9)
Monocytes: 8 %
Neutrophils Absolute: 7 10*3/uL (ref 1.4–7.0)
Neutrophils: 72 %
Platelets: 205 10*3/uL (ref 150–450)
RBC: 4.81 x10E6/uL (ref 4.14–5.80)
RDW: 13.8 % (ref 12.3–15.4)
WBC: 9.8 10*3/uL (ref 3.4–10.8)

## 2017-12-19 DIAGNOSIS — H01025 Squamous blepharitis left lower eyelid: Secondary | ICD-10-CM | POA: Diagnosis not present

## 2017-12-19 DIAGNOSIS — H01024 Squamous blepharitis left upper eyelid: Secondary | ICD-10-CM | POA: Diagnosis not present

## 2017-12-19 DIAGNOSIS — H01021 Squamous blepharitis right upper eyelid: Secondary | ICD-10-CM | POA: Diagnosis not present

## 2017-12-19 DIAGNOSIS — H10413 Chronic giant papillary conjunctivitis, bilateral: Secondary | ICD-10-CM | POA: Diagnosis not present

## 2017-12-19 DIAGNOSIS — H01022 Squamous blepharitis right lower eyelid: Secondary | ICD-10-CM | POA: Diagnosis not present

## 2017-12-20 ENCOUNTER — Other Ambulatory Visit: Payer: Self-pay | Admitting: Nurse Practitioner

## 2017-12-26 ENCOUNTER — Telehealth: Payer: Self-pay | Admitting: Cardiovascular Disease

## 2017-12-26 NOTE — Telephone Encounter (Signed)
Spoke to patient concerning a f/u with an abdominal US to check aneurysm.  I mentioned that it was to be scheduled 1 year out from February 2019.  He has an appt wth Dr Johnsie Cancel in October, which may be the time he arranges this.  He verbalized understanding.

## 2017-12-26 NOTE — Telephone Encounter (Signed)
New Message        Patient would like an ealier appt for his U/S he states it's been well over a year. Pls advise.

## 2018-01-03 ENCOUNTER — Ambulatory Visit: Payer: Medicare Other | Admitting: Family Medicine

## 2018-01-17 ENCOUNTER — Encounter: Payer: Self-pay | Admitting: Cardiovascular Disease

## 2018-02-06 ENCOUNTER — Other Ambulatory Visit: Payer: Self-pay | Admitting: Family Medicine

## 2018-03-02 ENCOUNTER — Ambulatory Visit: Payer: Medicare Other | Admitting: Cardiovascular Disease

## 2018-03-09 ENCOUNTER — Other Ambulatory Visit: Payer: Self-pay | Admitting: Family Medicine

## 2018-03-20 ENCOUNTER — Telehealth: Payer: Self-pay

## 2018-03-20 DIAGNOSIS — R140 Abdominal distension (gaseous): Secondary | ICD-10-CM | POA: Diagnosis not present

## 2018-03-20 DIAGNOSIS — Z86010 Personal history of colonic polyps: Secondary | ICD-10-CM | POA: Diagnosis not present

## 2018-03-20 DIAGNOSIS — R1084 Generalized abdominal pain: Secondary | ICD-10-CM | POA: Diagnosis not present

## 2018-03-20 DIAGNOSIS — K30 Functional dyspepsia: Secondary | ICD-10-CM | POA: Diagnosis not present

## 2018-03-20 DIAGNOSIS — K580 Irritable bowel syndrome with diarrhea: Secondary | ICD-10-CM | POA: Diagnosis not present

## 2018-03-20 NOTE — Telephone Encounter (Signed)
Patient called today stating he has some nausea and vomiting loose stool with occasional chill,bloats when he eats. Woke this am with dizziness. I spoke with Dr.Steve Luking,whom advised to go to the ed. He states with the dizziness and nausea needs to be evaluated there. The pt is aware and states understanding.

## 2018-03-22 ENCOUNTER — Other Ambulatory Visit: Payer: Self-pay | Admitting: Family Medicine

## 2018-03-22 NOTE — Telephone Encounter (Signed)
Ok plus five monthly ref 

## 2018-03-23 DIAGNOSIS — Z23 Encounter for immunization: Secondary | ICD-10-CM | POA: Diagnosis not present

## 2018-03-31 ENCOUNTER — Other Ambulatory Visit: Payer: Self-pay

## 2018-04-04 NOTE — Progress Notes (Signed)
Patient ID: [name] [name], male   DOB: 03-Jan-1941, 77 y.o.   MRN: [id number]     [name] [name] is seen today in followup for his coronary artery disease. 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 Olevia Perches I believe back in 1999. His last Myoview in July of 2009 was nonischemic. He has good LV function. His been compliant with his meds. He has a 4.0 cm AAA  He has not had any abdominal pain. He is active. 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 He prefers to have his duplex at [facility]. He continues to see Mickie Hillier for his primary care needs. Also has had some palpitations and monitor showing PAC;s short bursts PSVT  Indicates intolerance to beta blockers toprol and bystolic .  Interestingly diarrhea occurred with both Has had issues with coreg, atenolol, cardizem and ranexa in past. Some nasal Congestion with ARB in past as well   Wife passed a year ago Thanksgiving Pancreatic Cancer and only lasted 6 weeks form diagnosis   ROS: Denies fever, malais, weight loss, blurry vision, decreased visual acuity, cough, sputum, SOB, hemoptysis, pleuritic pain, palpitaitons, heartburn, abdominal pain, melena, lower extremity edema, claudication, or rash.  All other systems reviewed and negative  General: BP 102/68   Pulse 76   Ht 6\' 1"  (1.854 m)   Wt 203 lb (92.1 kg)   SpO2 98%   BMI 26.78 kg/m   Affect appropriate Healthy:  appears stated age 24: hard of hearing Aides in place  Neck supple with no adenopathy JVP normal no bruits no thyromegaly Lungs Mild exp wheezing and good diaphragmatic motion Heart:  S1/S2 no murmur, no rub, gallop or click PMI normal Abdomen: benighn, BS positve, no tenderness, no AAA no bruit.  No HSM or HJR Distal pulses intact with no bruits No edema Neuro non-focal Skin warm and dry No muscular weakness   Current Outpatient Medications  Medication Sig Dispense Refill  .  albuterol (PROVENTIL HFA;VENTOLIN HFA) 108 (90 Base) MCG/ACT inhaler Inhale 2 puffs into the lungs every 6 (six) hours as needed for wheezing or shortness of breath. 1 Inhaler 5  . albuterol (PROVENTIL) (2.5 MG/3ML) 0.083% nebulizer solution INHALE 1 VIAL VIA NEBULIZER FOUR TIMES DAILY. 375 mL 0  . cetirizine (ZYRTEC) 10 MG tablet Take 10 mg by mouth daily.    Marland Kitchen esomeprazole (NEXIUM) 20 MG capsule Take 20 mg by mouth daily at 12 noon.    Marland Kitchen FLOVENT HFA 220 MCG/ACT inhaler INHALE 2 PUFFS INTO LUNGS 2 TIMES DAILY. 12 g 5  . LORazepam (ATIVAN) 1 MG tablet TAKE (1) TABLET BY MOUTH AT BEDTIME. (Patient taking differently: Take 0.5 mg by mouth at bedtime. ) 30 tablet 5  . losartan (COZAAR) 25 MG tablet TAKE ONE TABLET BY MOUTH ONCE DAILY. 90 tablet 1  . lovastatin (MEVACOR) 40 MG tablet Take 40 mg by mouth at bedtime.    . montelukast (SINGULAIR) 10 MG tablet TAKE ONE TABLET BY MOUTH AT BEDTIME AS NEEDED FOR ALLERGIES. 30 tablet 5  . mupirocin ointment (BACTROBAN) 2 % APPLY TO AFFECTED AREAS TWICE DAILY. 15 g 0  . nitroGLYCERIN (NITROSTAT) 0.4 MG SL tablet PLACE 1 TAB UNDER TONGUE EVERY 5 MIN IF NEEDED FOR CHEST PAIN. MAY USE 3 TIMES.NO RELIEF CALL 911. 25 tablet 5  . sucralfate (CARAFATE) 1 g tablet Take 1 g by mouth 2 (two) times  daily.     . tamsulosin (FLOMAX) 0.4 MG CAPS capsule One po QHS 30 capsule 5   No current facility-administered medications for this visit.     Allergies  Lasix [furosemide]; Cefzil [cefprozil]; Dexamethasone; Gabapentin; Neomycin; Tetracyclines & related; Ciprofloxacin; Methocarbamol; and Penicillins  Electrocardiogram:  04/07/18  SR PAC;s otherwise normal   Assessment and Plan  CAD:  Distant PCI RCA 1999 with normal myovue 2009  conitnue medical Rx intolerant to beta blocker  f/u exercise myouve since its been so long for any ischemic evaluaition   AAA:  4.0 cm had CT 06/30/17 will order [place] for August 2020   HTN:  Improved but have to hold ARB for now   GERD:   Continue carafate and nexium f/u GI  Asthma:  On inhalers no active wheezing   PAC/SVT:  Stable unable to take beta blockers  Anxiety/Depression:   Has not started on SSRI   ENT:  ARB held hearing worse using Aides   HLD:  On mevacor 40 mg daily LDL 67 at goal    F/U with me 6 months    Jenkins Rouge

## 2018-04-07 ENCOUNTER — Other Ambulatory Visit: Payer: Self-pay | Admitting: Family Medicine

## 2018-04-07 ENCOUNTER — Encounter: Payer: Self-pay | Admitting: Cardiovascular Disease

## 2018-04-07 ENCOUNTER — Ambulatory Visit (INDEPENDENT_AMBULATORY_CARE_PROVIDER_SITE_OTHER): Payer: Medicare Other | Admitting: Cardiovascular Disease

## 2018-04-07 VITALS — BP 102/68 | HR 76 | Ht 73.0 in | Wt 203.0 lb

## 2018-04-07 DIAGNOSIS — I714 Abdominal aortic aneurysm, without rupture, unspecified: Secondary | ICD-10-CM

## 2018-04-07 DIAGNOSIS — I2510 Atherosclerotic heart disease of native coronary artery without angina pectoris: Secondary | ICD-10-CM

## 2018-04-07 NOTE — Patient Instructions (Signed)
Medication Instructions:  Your physician recommends that you continue on your current medications as directed. Please refer to the Current Medication list given to you today.  If you need a refill on your cardiac medications before your next appointment, please call your pharmacy.   Lab work: none If you have labs (blood work) drawn today and your tests are completely normal, you will receive your results only by: Marland Kitchen MyChart Message (if you have MyChart) OR . A paper copy in the mail If you have any lab test that is abnormal or we need to change your treatment, we will call you to review the results.  Testing/Procedures: Your physician has requested that you have an abdominal aorta duplex. During this test, an ultrasound is used to evaluate the aorta. Allow 30 minutes for this exam. Do not eat after midnight the day before and avoid carbonated beverages     Follow-Up: At [facility], you and your health needs are our priority.  As part of our continuing mission to provide you with exceptional heart care, we have created designated Provider Care Teams.  These Care Teams include your primary Cardiologist (physician) and Advanced Practice Providers (APPs -  Physician Assistants and Nurse Practitioners) who all work together to provide you with the care you need, when you need it. You will need a follow up appointment in 1 years.  Please call our office 2 months in advance to schedule this appointment.  You may see Jenkins Rouge, MD or one of the following Advanced Practice Providers on your designated Care Team:   Bernerd Pho, PA-C [facility]) . Ermalinda Barrios, PA-C ([place])  Any Other Special Instructions Will Be Listed Below (If Applicable). none

## 2018-04-19 DIAGNOSIS — R140 Abdominal distension (gaseous): Secondary | ICD-10-CM | POA: Diagnosis not present

## 2018-04-19 DIAGNOSIS — K219 Gastro-esophageal reflux disease without esophagitis: Secondary | ICD-10-CM | POA: Diagnosis not present

## 2018-04-19 DIAGNOSIS — K580 Irritable bowel syndrome with diarrhea: Secondary | ICD-10-CM | POA: Diagnosis not present

## 2018-04-27 ENCOUNTER — Other Ambulatory Visit: Payer: Self-pay | Admitting: Cardiovascular Disease

## 2018-05-09 ENCOUNTER — Telehealth: Payer: Self-pay | Admitting: Family Medicine

## 2018-05-09 MED ORDER — MAGIC MOUTHWASH: 15.0000 mL | Freq: Four times a day (QID) | ORAL | 0 refills | Status: DC

## 2018-05-09 NOTE — Telephone Encounter (Signed)
Pt thinks he has thrush due to inhaler use. It has been bothering him on and off a couple of days and last night his throat was irritated. He has been using his inhaler the past couple of days. If able to call something in please send to [place], [place].

## 2018-05-09 NOTE — Telephone Encounter (Signed)
[facility] magic moutwash 16 oz one tablespoon swish  gargle and spit qid/one refill

## 2018-05-09 NOTE — Telephone Encounter (Signed)
Prescription faxed to pharmacy. Patient notified. 

## 2018-05-11 ENCOUNTER — Encounter: Payer: Self-pay | Admitting: Family Medicine

## 2018-05-11 ENCOUNTER — Ambulatory Visit (INDEPENDENT_AMBULATORY_CARE_PROVIDER_SITE_OTHER): Payer: Medicare Other | Admitting: Family Medicine

## 2018-05-11 VITALS — BP 142/80 | Temp 98.3°F | Ht 73.0 in | Wt 203.0 lb

## 2018-05-11 DIAGNOSIS — J019 Acute sinusitis, unspecified: Secondary | ICD-10-CM | POA: Diagnosis not present

## 2018-05-11 DIAGNOSIS — R062 Wheezing: Secondary | ICD-10-CM

## 2018-05-11 DIAGNOSIS — I2510 Atherosclerotic heart disease of native coronary artery without angina pectoris: Secondary | ICD-10-CM

## 2018-05-11 DIAGNOSIS — B9689 Other specified bacterial agents as the cause of diseases classified elsewhere: Secondary | ICD-10-CM

## 2018-05-11 MED ORDER — PREDNISONE 20 MG PO TABS: ORAL_TABLET | ORAL | 0 refills | Status: DC

## 2018-05-11 MED ORDER — AZITHROMYCIN 250 MG PO TABS: ORAL_TABLET | ORAL | 0 refills | Status: DC

## 2018-05-11 NOTE — Progress Notes (Signed)
   Subjective:    Patient ID: [name] [name], male    DOB: 02/03/1941, 77 y.o.   MRN: [id number]  Sinusitis  This is a new problem. Episode onset: 4 and a half days. Associated symptoms include congestion, coughing and a sore throat. Pertinent negatives include no chills or ear pain. Treatments tried: otc cordicin cold tablets.   starteds 4 now worse Into the chest 4 days ago No sweats and chills No n v Mild doe    Review of Systems  Constitutional: Negative for activity change, chills and fever.  HENT: Positive for congestion, rhinorrhea and sore throat. Negative for ear pain.   Eyes: Negative for discharge.  Respiratory: Positive for cough. Negative for wheezing.   Cardiovascular: Negative for chest pain.  Gastrointestinal: Negative for nausea and vomiting.  Musculoskeletal: Negative for arthralgias.       Objective:   Physical Exam Vitals signs and nursing note reviewed.  Constitutional:      Appearance: He is well-developed.  HENT:     Head: Normocephalic.     Mouth/Throat:     Pharynx: No oropharyngeal exudate.  Neck:     Musculoskeletal: Normal range of motion.  Cardiovascular:     Rate and Rhythm: Normal rate and regular rhythm.     Heart sounds: Normal heart sounds. No murmur.  Pulmonary:     Effort: Pulmonary effort is normal. No respiratory distress.     Breath sounds: Wheezing present. No rales.  Lymphadenopathy:     Cervical: No cervical adenopathy.  Skin:    General: Skin is warm and dry.  Neurological:     Motor: No abnormal muscle tone.    Slight wheeze noted O2 saturation 97%       Assessment & Plan:  Viral syndrome Secondary rhinosinusitis Bronchitis as well I do not find evidence of pneumonia Antibiotics recommended Short course prednisone Follow-up if progressive troubles or worse

## 2018-05-25 ENCOUNTER — Ambulatory Visit (INDEPENDENT_AMBULATORY_CARE_PROVIDER_SITE_OTHER): Payer: Medicare Other | Admitting: Family Medicine

## 2018-05-25 ENCOUNTER — Encounter: Payer: Self-pay | Admitting: Family Medicine

## 2018-05-25 VITALS — BP 144/90 | Temp 97.7°F | Wt 201.4 lb

## 2018-05-25 DIAGNOSIS — N410 Acute prostatitis: Secondary | ICD-10-CM

## 2018-05-25 DIAGNOSIS — B9689 Other specified bacterial agents as the cause of diseases classified elsewhere: Secondary | ICD-10-CM | POA: Diagnosis not present

## 2018-05-25 DIAGNOSIS — R300 Dysuria: Secondary | ICD-10-CM | POA: Diagnosis not present

## 2018-05-25 DIAGNOSIS — J019 Acute sinusitis, unspecified: Secondary | ICD-10-CM | POA: Diagnosis not present

## 2018-05-25 LAB — POCT URINALYSIS DIPSTICK
Spec Grav, UA: 1.01 (ref 1.010–1.025)
pH, UA: 6 (ref 5.0–8.0)

## 2018-05-25 MED ORDER — SULFAMETHOXAZOLE-TRIMETHOPRIM 800-160 MG PO TABS: 1.0000 | ORAL_TABLET | Freq: Two times a day (BID) | ORAL | 0 refills | Status: DC

## 2018-05-25 NOTE — Progress Notes (Signed)
   Subjective:    Patient ID: [name] [name], male    DOB: September 05, 1940, 78 y.o.   MRN: [id number]  Urinary Tract Infection   This is a new problem. The current episode started in the past 7 days. Associated symptoms include frequency. Associated symptoms comments: Burning while urinating, strong urge to go but cant go, painful urination. . He has tried nothing for the symptoms.  Sore Throat   This is a new problem. The current episode started 1 to 4 weeks ago. Associated symptoms include coughing. Associated symptoms comments: Runny nose. Treatments tried: cough drops. The treatment provided mild relief.   Results for orders placed or performed in visit on 05/25/18  POCT Urinalysis Dipstick  Result Value Ref Range   Color, UA     Clarity, UA     Glucose, UA     Bilirubin, UA +    Ketones, UA     Spec Grav, UA 1.010 1.010 - 1.025   Blood, UA     pH, UA 6.0 5.0 - 8.0   Protein, UA     Urobilinogen, UA     Nitrite, UA     Leukocytes, UA     Appearance     Odor     Noted some burnign and discomfort with thre urinating  Urine flow has defineitely dropped off in the fpast few day s, no dysuria, definitely increased frequency.  No major abdominal pain no other major back pain.    Throat still irritated   ent has not seen weight.  No major fevers.  Type still decent.  Review of Systems  Respiratory: Positive for cough.   Genitourinary: Positive for frequency.       Objective:   Physical Exam  Alert active good hydration mild nasal congestion pharynx normal neck supple TMs normal lungs clear.  Heart rate and rhythm.  Prostate exam boggy soft tender  Urinalysis within normal limits  1.  Chronic rhinitis/sore throat.  He has seen ENT multiple times in the past.  Possible bacterial component but not for sure.  Discussed.  Since we are covering with antibiotics anyways her problem reduced.  Likely will help.  2.  Acute prostatitis.  Discussed.  Symptom care discussed.   Antibiotics prescribed.  Hopefully will improve regular symptoms  Multiple questions answered  Greater than 50% of this 25 minute face to face visit was spent in counseling and discussion and coordination of care regarding the above diagnosis/diagnosies     Assessment & Plan:

## 2018-06-01 DIAGNOSIS — Z961 Presence of intraocular lens: Secondary | ICD-10-CM | POA: Diagnosis not present

## 2018-06-01 DIAGNOSIS — H33053 Total retinal detachment, bilateral: Secondary | ICD-10-CM | POA: Diagnosis not present

## 2018-06-01 DIAGNOSIS — H401111 Primary open-angle glaucoma, right eye, mild stage: Secondary | ICD-10-CM | POA: Diagnosis not present

## 2018-06-01 DIAGNOSIS — H04123 Dry eye syndrome of bilateral lacrimal glands: Secondary | ICD-10-CM | POA: Diagnosis not present

## 2018-06-01 DIAGNOSIS — H401122 Primary open-angle glaucoma, left eye, moderate stage: Secondary | ICD-10-CM | POA: Diagnosis not present

## 2018-06-13 ENCOUNTER — Other Ambulatory Visit: Payer: Self-pay | Admitting: Family Medicine

## 2018-06-14 DIAGNOSIS — D229 Melanocytic nevi, unspecified: Secondary | ICD-10-CM | POA: Diagnosis not present

## 2018-06-14 DIAGNOSIS — Z85820 Personal history of malignant melanoma of skin: Secondary | ICD-10-CM | POA: Diagnosis not present

## 2018-06-14 DIAGNOSIS — L570 Actinic keratosis: Secondary | ICD-10-CM | POA: Diagnosis not present

## 2018-06-19 ENCOUNTER — Other Ambulatory Visit: Payer: Self-pay | Admitting: Family Medicine

## 2018-06-19 DIAGNOSIS — Z6826 Body mass index (BMI) 26.0-26.9, adult: Secondary | ICD-10-CM | POA: Diagnosis not present

## 2018-06-19 DIAGNOSIS — I10 Essential (primary) hypertension: Secondary | ICD-10-CM | POA: Diagnosis not present

## 2018-06-19 DIAGNOSIS — M542 Cervicalgia: Secondary | ICD-10-CM | POA: Diagnosis not present

## 2018-07-05 ENCOUNTER — Telehealth: Payer: Self-pay | Admitting: Family Medicine

## 2018-07-05 DIAGNOSIS — M1990 Unspecified osteoarthritis, unspecified site: Secondary | ICD-10-CM

## 2018-07-05 NOTE — Telephone Encounter (Signed)
Referral ordered in Epic. 

## 2018-07-05 NOTE — Telephone Encounter (Signed)
Lets try,

## 2018-07-05 NOTE — Telephone Encounter (Signed)
Please advise. Thank you

## 2018-07-05 NOTE — Telephone Encounter (Signed)
Patient would like referral for arthritis to Dr. Dossie Der at [facility], [phone]. Advise.

## 2018-07-07 NOTE — Telephone Encounter (Signed)
Left message to return call 

## 2018-07-07 NOTE — Telephone Encounter (Signed)
Checking on status

## 2018-07-07 NOTE — Telephone Encounter (Signed)
Pt returned call and informed that referral has been placed. Pt verbalized understanding.

## 2018-07-11 ENCOUNTER — Ambulatory Visit (INDEPENDENT_AMBULATORY_CARE_PROVIDER_SITE_OTHER): Payer: Medicare Other

## 2018-07-11 ENCOUNTER — Telehealth: Payer: Self-pay | Admitting: Family Medicine

## 2018-07-11 ENCOUNTER — Ambulatory Visit (INDEPENDENT_AMBULATORY_CARE_PROVIDER_SITE_OTHER): Payer: Medicare Other | Admitting: Orthopaedic Surgery

## 2018-07-11 ENCOUNTER — Encounter: Payer: Self-pay | Admitting: Orthopaedic Surgery

## 2018-07-11 VITALS — BP 131/83 | HR 78 | Ht 73.0 in | Wt 200.0 lb

## 2018-07-11 DIAGNOSIS — M25512 Pain in left shoulder: Secondary | ICD-10-CM | POA: Diagnosis not present

## 2018-07-11 NOTE — Telephone Encounter (Signed)
Patient providing [facility] fax number for you for his referral [phone] [name] [name]

## 2018-07-11 NOTE — Progress Notes (Signed)
Patient [name] [name], male DOB:06-07-1940, 78 y.o. BUL:[id number]  Chief Complaint  Patient presents with  . Shoulder Pain    left    HPI  [name] [name] is a 78 y.o. male who has developed pain in the left shoulder over the last year that is gradually getting worse and worse.  He has no trauma.  He has some popping at times, no swelling.  He has pain with overhead use and sleeping on it.  He has no numbness.  He has no neck pain.  He cannot take NSAIDs secondary to reflex.   Body mass index is 26.39 kg/m.  ROS  Review of Systems  Constitutional: Positive for activity change.  Respiratory: Positive for shortness of breath.   Musculoskeletal: Positive for arthralgias and back pain.  All other systems reviewed and are negative.  For Review of Systems, all other systems reviewed and are negative.  The following is a summary of the past history medically, past history surgically, known current medicines, social history and family history.  This information is gathered electronically by the computer from prior information and documentation.  I review this each visit and have found including this information at this point in the chart is beneficial and informative.   Past Medical History:  Diagnosis Date  . AAA (abdominal aortic aneurysm) ([place])    needs yearly ultrasound  . Allergy   . Anemia   . Arthritis   . Asthma   . BPH (benign prostatic hyperplasia)   . CAD (coronary artery disease)   . Cancer ([place])    skin cancer  . COPD (chronic obstructive pulmonary disease) ([place])   . Dysrhythmia    pt. states it can be fast at times  . GERD (gastroesophageal reflux disease)   . Glaucoma   . HOH (hard of hearing)   . Hypercholesterolemia   . Hypertension   . Impaired fasting glucose   . Low back pain   . MI (myocardial infarction) ([place]) 1999  . Thrush     Past Surgical History:  Procedure Laterality Date  . BACK SURGERY     x 3  . CARDIAC CATHETERIZATION     angioplasty   . CATARACT EXTRACTION W/PHACO  03/20/2012   Procedure: CATARACT EXTRACTION PHACO AND INTRAOCULAR LENS PLACEMENT (IOC);  Surgeon: Williams Che, MD;  Location: [facility];  Service: Ophthalmology;  Laterality: Right;  CDE:  8.45  . CATARACT EXTRACTION W/PHACO Left 04/02/2013   Procedure: CATARACT EXTRACTION PHACO AND INTRAOCULAR LENS PLACEMENT (IOC);  Surgeon: Williams Che, MD;  Location: [facility];  Service: Ophthalmology;  Laterality: Left;  CDE:  6.50  . CHOLECYSTECTOMY  2000  . COLONOSCOPY  2009   repeat 5 years  . ESOPHAGOGASTRODUODENOSCOPY    . HERNIA REPAIR Left    inguinal  . LAPAROSCOPIC PARTIAL COLECTOMY N/A 06/11/2013   Procedure: LAPAROSCOPIC HAND ASSISTED PARTIAL COLECTOMY;  Surgeon: Jamesetta So, MD;  Location: [facility];  Service: General;  Laterality: N/A;  . right eye detached retina Bilateral   . SPINAL FUSION  2016  . YAG LASER APPLICATION Left 32/04/2481   Procedure: YAG LASER APPLICATION;  Surgeon: Williams Che, MD;  Location: [facility];  Service: Ophthalmology;  Laterality: Left;    Current Outpatient Medications on File Prior to Visit  Medication Sig Dispense Refill  . cetirizine (ZYRTEC) 10 MG tablet Take 10 mg by mouth daily.    Marland Kitchen esomeprazole (NEXIUM) 20 MG capsule Take 20 mg by mouth daily at  12 noon.    Marland Kitchen LORazepam (ATIVAN) 1 MG tablet TAKE (1) TABLET BY MOUTH AT BEDTIME. (Patient taking differently: Take 0.5 mg by mouth at bedtime. ) 30 tablet 5  . losartan (COZAAR) 25 MG tablet TAKE ONE TABLET BY MOUTH ONCE DAILY. 90 tablet 3  . lovastatin (MEVACOR) 40 MG tablet TAKE 2 TABLETS BY MOUTH DAILY. 60 tablet 5  . Multiple Vitamin (MULTIVITAMIN) tablet Take 1 tablet by mouth daily.    . Probiotic Product (PROBIOTIC DAILY PO) Take by mouth.    . sucralfate (CARAFATE) 1 g tablet Take 1 g by mouth 2 (two) times daily.     Marland Kitchen albuterol (PROVENTIL HFA;VENTOLIN HFA) 108 (90 Base) MCG/ACT inhaler Inhale 2 puffs into the lungs every 6 (six) hours as needed for wheezing or  shortness of breath. (Patient not taking: Reported on 07/11/2018) 1 Inhaler 5  . albuterol (PROVENTIL) (2.5 MG/3ML) 0.083% nebulizer solution INHALE 1 VIAL VIA NEBULIZER FOUR TIMES DAILY. (Patient not taking: Reported on 07/11/2018) 375 mL 0  . FLOVENT HFA 220 MCG/ACT inhaler INHALE 2 PUFFS INTO LUNGS 2 TIMES DAILY. (Patient not taking: Reported on 07/11/2018) 12 g 0  . montelukast (SINGULAIR) 10 MG tablet TAKE ONE TABLET BY MOUTH AT BEDTIME AS NEEDED FOR ALLERGIES. (Patient not taking: Reported on 07/11/2018) 30 tablet 5  . mupirocin ointment (BACTROBAN) 2 % APPLY TO AFFECTED AREAS TWICE DAILY. (Patient not taking: Reported on 07/11/2018) 15 g 0  . nitroGLYCERIN (NITROSTAT) 0.4 MG SL tablet PLACE 1 TAB UNDER TONGUE EVERY 5 MIN IF NEEDED FOR CHEST PAIN. MAY USE 3 TIMES.NO RELIEF CALL 911. (Patient not taking: Reported on 07/11/2018) 25 tablet 5  . tamsulosin (FLOMAX) 0.4 MG CAPS capsule One po QHS (Patient not taking: Reported on 07/11/2018) 30 capsule 5   No current facility-administered medications on file prior to visit.     Social History   Socioeconomic History  . Marital status: Married    Spouse name: Not on file  . Number of children: Not on file  . Years of education: Not on file  . Highest education level: Not on file  Occupational History  . Not on file  Social Needs  . Financial resource strain: Not on file  . Food insecurity:    Worry: Not on file    Inability: Not on file  . Transportation needs:    Medical: Not on file    Non-medical: Not on file  Tobacco Use  . Smoking status: Former Smoker    Packs/day: 1.00    Years: 35.00    Pack years: 35.00    Types: Cigarettes    Last attempt to quit: 03/28/2003    Years since quitting: 15.2  . Smokeless tobacco: Never Used  Substance and Sexual Activity  . Alcohol use: No    Alcohol/week: 0.0 standard drinks  . Drug use: No  . Sexual activity: Yes    Birth control/protection: None  Lifestyle  . Physical activity:    Days  per week: Not on file    Minutes per session: Not on file  . Stress: Not on file  Relationships  . Social connections:    Talks on phone: Not on file    Gets together: Not on file    Attends religious service: Not on file    Active member of club or organization: Not on file    Attends meetings of clubs or organizations: Not on file    Relationship status: Not on file  . Intimate  partner violence:    Fear of current or ex partner: Not on file    Emotionally abused: Not on file    Physically abused: Not on file    Forced sexual activity: Not on file  Other Topics Concern  . Not on file  Social History Narrative  . Not on file    Family History  Problem Relation Age of Onset  . Hypertension Mother   . COPD Father   . Cancer Brother        brain    BP 131/83   Pulse 78   Ht 6\' 1"  (1.854 m)   Wt 200 lb (90.7 kg)   BMI 26.39 kg/m   Body mass index is 26.39 kg/m.  All other systems reviewed and are negative.  The following is a summary of the past history medically, past history surgically, known current medicines, social history and family history.  This information is gathered electronically by the computer from prior information and documentation.  I review this each visit and have found including this information at this point in the chart is beneficial and informative.    Past Medical History:  Diagnosis Date  . AAA (abdominal aortic aneurysm) ([place])    needs yearly ultrasound  . Allergy   . Anemia   . Arthritis   . Asthma   . BPH (benign prostatic hyperplasia)   . CAD (coronary artery disease)   . Cancer ([place])    skin cancer  . COPD (chronic obstructive pulmonary disease) ([place])   . Dysrhythmia    pt. states it can be fast at times  . GERD (gastroesophageal reflux disease)   . Glaucoma   . HOH (hard of hearing)   . Hypercholesterolemia   . Hypertension   . Impaired fasting glucose   . Low back pain   . MI (myocardial infarction) ([place]) 1999  . Thrush      Past Surgical History:  Procedure Laterality Date  . BACK SURGERY     x 3  . CARDIAC CATHETERIZATION     angioplasty  . CATARACT EXTRACTION W/PHACO  03/20/2012   Procedure: CATARACT EXTRACTION PHACO AND INTRAOCULAR LENS PLACEMENT (IOC);  Surgeon: Williams Che, MD;  Location: [facility];  Service: Ophthalmology;  Laterality: Right;  CDE:  8.45  . CATARACT EXTRACTION W/PHACO Left 04/02/2013   Procedure: CATARACT EXTRACTION PHACO AND INTRAOCULAR LENS PLACEMENT (IOC);  Surgeon: Williams Che, MD;  Location: [facility];  Service: Ophthalmology;  Laterality: Left;  CDE:  6.50  . CHOLECYSTECTOMY  2000  . COLONOSCOPY  2009   repeat 5 years  . ESOPHAGOGASTRODUODENOSCOPY    . HERNIA REPAIR Left    inguinal  . LAPAROSCOPIC PARTIAL COLECTOMY N/A 06/11/2013   Procedure: LAPAROSCOPIC HAND ASSISTED PARTIAL COLECTOMY;  Surgeon: Jamesetta So, MD;  Location: [facility];  Service: General;  Laterality: N/A;  . right eye detached retina Bilateral   . SPINAL FUSION  2016  . YAG LASER APPLICATION Left 50/27/7412   Procedure: YAG LASER APPLICATION;  Surgeon: Williams Che, MD;  Location: [facility];  Service: Ophthalmology;  Laterality: Left;    Family History  Problem Relation Age of Onset  . Hypertension Mother   . COPD Father   . Cancer Brother        brain    Social History Social History   Tobacco Use  . Smoking status: Former Smoker    Packs/day: 1.00    Years: 35.00    Pack years: 35.00  Types: Cigarettes    Last attempt to quit: 03/28/2003    Years since quitting: 15.2  . Smokeless tobacco: Never Used  Substance Use Topics  . Alcohol use: No    Alcohol/week: 0.0 standard drinks  . Drug use: No    Allergies  Allergen Reactions  . Lasix [Furosemide] Rash  . Cefzil [Cefprozil] Nausea Only  . Dexamethasone Swelling  . Gabapentin     Legs swell up   . Neomycin Other (See Comments)    Patient can't remember.  . Tetracyclines & Related Itching  . Ciprofloxacin Nausea And  Vomiting, Rash and Other (See Comments)    Body aches   . Methocarbamol Swelling and Rash  . Penicillins Rash    Has patient had a PCN reaction causing immediate rash, facial/tongue/throat swelling, SOB or lightheadedness with hypotension: yes Has patient had a PCN reaction causing severe rash involving mucus membranes or skin necrosis: unknown Has patient had a PCN reaction that required hospitalization: no Has patient had a PCN reaction occurring within the last 10 years: No If all of the above answers are "NO", then may proceed with Cephalosporin use.     Current Outpatient Medications  Medication Sig Dispense Refill  . cetirizine (ZYRTEC) 10 MG tablet Take 10 mg by mouth daily.    Marland Kitchen esomeprazole (NEXIUM) 20 MG capsule Take 20 mg by mouth daily at 12 noon.    Marland Kitchen LORazepam (ATIVAN) 1 MG tablet TAKE (1) TABLET BY MOUTH AT BEDTIME. (Patient taking differently: Take 0.5 mg by mouth at bedtime. ) 30 tablet 5  . losartan (COZAAR) 25 MG tablet TAKE ONE TABLET BY MOUTH ONCE DAILY. 90 tablet 3  . lovastatin (MEVACOR) 40 MG tablet TAKE 2 TABLETS BY MOUTH DAILY. 60 tablet 5  . Multiple Vitamin (MULTIVITAMIN) tablet Take 1 tablet by mouth daily.    . Probiotic Product (PROBIOTIC DAILY PO) Take by mouth.    . sucralfate (CARAFATE) 1 g tablet Take 1 g by mouth 2 (two) times daily.     Marland Kitchen albuterol (PROVENTIL HFA;VENTOLIN HFA) 108 (90 Base) MCG/ACT inhaler Inhale 2 puffs into the lungs every 6 (six) hours as needed for wheezing or shortness of breath. (Patient not taking: Reported on 07/11/2018) 1 Inhaler 5  . albuterol (PROVENTIL) (2.5 MG/3ML) 0.083% nebulizer solution INHALE 1 VIAL VIA NEBULIZER FOUR TIMES DAILY. (Patient not taking: Reported on 07/11/2018) 375 mL 0  . FLOVENT HFA 220 MCG/ACT inhaler INHALE 2 PUFFS INTO LUNGS 2 TIMES DAILY. (Patient not taking: Reported on 07/11/2018) 12 g 0  . montelukast (SINGULAIR) 10 MG tablet TAKE ONE TABLET BY MOUTH AT BEDTIME AS NEEDED FOR ALLERGIES. (Patient not  taking: Reported on 07/11/2018) 30 tablet 5  . mupirocin ointment (BACTROBAN) 2 % APPLY TO AFFECTED AREAS TWICE DAILY. (Patient not taking: Reported on 07/11/2018) 15 g 0  . nitroGLYCERIN (NITROSTAT) 0.4 MG SL tablet PLACE 1 TAB UNDER TONGUE EVERY 5 MIN IF NEEDED FOR CHEST PAIN. MAY USE 3 TIMES.NO RELIEF CALL 911. (Patient not taking: Reported on 07/11/2018) 25 tablet 5  . tamsulosin (FLOMAX) 0.4 MG CAPS capsule One po QHS (Patient not taking: Reported on 07/11/2018) 30 capsule 5   No current facility-administered medications for this visit.      Physical Exam  Blood pressure 131/83, pulse 78, height 6\' 1"  (1.854 m), weight 200 lb (90.7 kg).  Constitutional: overall normal hygiene, normal nutrition, well developed, normal grooming, normal body habitus. Assistive device:none  Musculoskeletal: gait and station Limp none, muscle tone and  strength are normal, no tremors or atrophy is present.  .  Neurological: coordination overall normal.  Deep tendon reflex/nerve stretch intact.  Sensation normal.  Cranial nerves II-XII intact.   Skin:   Normal overall no scars, lesions, ulcers or rashes. No psoriasis.  Psychiatric: Alert and oriented x 3.  Recent memory intact, remote memory unclear.  Normal mood and affect. Well groomed.  Good eye contact.  Cardiovascular: overall no swelling, no varicosities, no edema bilaterally, normal temperatures of the legs and arms, no clubbing, cyanosis and good capillary refill.  Lymphatic: palpation is normal.  Examination of left Upper Extremity is done.  Inspection:   Overall:  Elbow non-tender without crepitus or defects, forearm non-tender without crepitus or defects, wrist non-tender without crepitus or defects, hand non-tender.    Shoulder: with glenohumeral joint tenderness, without effusion.   Upper arm: without swelling and tenderness   Range of motion:   Overall:  Full range of motion of the elbow, full range of motion of wrist and full range of  motion in fingers.   Shoulder:  left  165 degrees forward flexion; 150 degrees abduction; 35 degrees internal rotation, 35 degrees external rotation, 15 degrees extension, 40 degrees adduction.   Stability:   Overall:  Shoulder, elbow and wrist stable   Strength and Tone:   Overall full shoulder muscles strength, full upper arm strength and normal upper arm bulk and tone.  All other systems reviewed and are negative   The patient has been educated about the nature of the problem(s) and counseled on treatment options.  The patient appeared to understand what I have discussed and is in agreement with it.  Encounter Diagnosis  Name Primary?  . Pain in joint of left shoulder Yes  x-rays were done of the left shoulder, reported separately.  PROCEDURE NOTE:  The patient request injection, verbal consent was obtained.  The left shoulder was prepped appropriately after time out was performed.   Sterile technique was observed and injection of 1 cc of Depo-Medrol 40 mg with several cc's of plain xylocaine. Anesthesia was provided by ethyl chloride and a 20-gauge needle was used to inject the shoulder area. A posterior approach was used.  The injection was tolerated well.  A band aid dressing was applied.  The patient was advised to apply ice later today and tomorrow to the injection sight as needed.    PLAN Call if any problems.  Precautions discussed.  Continue current medications.   Return to clinic 2 weeks   Electronically Signed Sanjuana Kava, MD 2/25/20209:28 AM

## 2018-07-12 ENCOUNTER — Encounter: Payer: Self-pay | Admitting: Family Medicine

## 2018-07-21 ENCOUNTER — Other Ambulatory Visit: Payer: Self-pay | Admitting: Family Medicine

## 2018-07-25 ENCOUNTER — Ambulatory Visit (INDEPENDENT_AMBULATORY_CARE_PROVIDER_SITE_OTHER): Payer: Medicare Other | Admitting: Orthopaedic Surgery

## 2018-07-25 ENCOUNTER — Encounter: Payer: Self-pay | Admitting: Orthopaedic Surgery

## 2018-07-25 VITALS — BP 115/74 | HR 80 | Ht 73.0 in | Wt 200.0 lb

## 2018-07-25 DIAGNOSIS — M25512 Pain in left shoulder: Secondary | ICD-10-CM | POA: Diagnosis not present

## 2018-07-25 NOTE — Progress Notes (Signed)
Patient [name] [name], male DOB:01/06/1941, 78 y.o. SAY:[id number]  Chief Complaint  Patient presents with  . Shoulder Pain    Left shoulder pain.    HPI  [name] [name] is a 78 y.o. male who has left shoulder pain. He is improved.  He says he is about 60% improved.  He still has some pain but not as much.  He is able to do more.  He wants to return as needed per his request.   Body mass index is 26.39 kg/m.  ROS  Review of Systems  Constitutional: Positive for activity change.  Respiratory: Positive for shortness of breath.   Musculoskeletal: Positive for arthralgias and back pain.  All other systems reviewed and are negative.   All other systems reviewed and are negative.  The following is a summary of the past history medically, past history surgically, known current medicines, social history and family history.  This information is gathered electronically by the computer from prior information and documentation.  I review this each visit and have found including this information at this point in the chart is beneficial and informative.    Past Medical History:  Diagnosis Date  . AAA (abdominal aortic aneurysm) ([place])    needs yearly ultrasound  . Allergy   . Anemia   . Arthritis   . Asthma   . BPH (benign prostatic hyperplasia)   . CAD (coronary artery disease)   . Cancer ([place])    skin cancer  . COPD (chronic obstructive pulmonary disease) ([place])   . Dysrhythmia    pt. states it can be fast at times  . GERD (gastroesophageal reflux disease)   . Glaucoma   . HOH (hard of hearing)   . Hypercholesterolemia   . Hypertension   . Impaired fasting glucose   . Low back pain   . MI (myocardial infarction) ([place]) 1999  . Thrush     Past Surgical History:  Procedure Laterality Date  . BACK SURGERY     x 3  . CARDIAC CATHETERIZATION     angioplasty  . CATARACT EXTRACTION W/PHACO  03/20/2012   Procedure: CATARACT EXTRACTION PHACO AND INTRAOCULAR LENS PLACEMENT (IOC);   Surgeon: Williams Che, MD;  Location: [facility];  Service: Ophthalmology;  Laterality: Right;  CDE:  8.45  . CATARACT EXTRACTION W/PHACO Left 04/02/2013   Procedure: CATARACT EXTRACTION PHACO AND INTRAOCULAR LENS PLACEMENT (IOC);  Surgeon: Williams Che, MD;  Location: [facility];  Service: Ophthalmology;  Laterality: Left;  CDE:  6.50  . CHOLECYSTECTOMY  2000  . COLONOSCOPY  2009   repeat 5 years  . ESOPHAGOGASTRODUODENOSCOPY    . HERNIA REPAIR Left    inguinal  . LAPAROSCOPIC PARTIAL COLECTOMY N/A 06/11/2013   Procedure: LAPAROSCOPIC HAND ASSISTED PARTIAL COLECTOMY;  Surgeon: Jamesetta So, MD;  Location: [facility];  Service: General;  Laterality: N/A;  . right eye detached retina Bilateral   . SPINAL FUSION  2016  . YAG LASER APPLICATION Left 23/55/7322   Procedure: YAG LASER APPLICATION;  Surgeon: Williams Che, MD;  Location: [facility];  Service: Ophthalmology;  Laterality: Left;    Family History  Problem Relation Age of Onset  . Hypertension Mother   . COPD Father   . Cancer Brother        brain    Social History Social History   Tobacco Use  . Smoking status: Former Smoker    Packs/day: 1.00    Years: 35.00    Pack years:  35.00    Types: Cigarettes    Last attempt to quit: 03/28/2003    Years since quitting: 15.3  . Smokeless tobacco: Never Used  Substance Use Topics  . Alcohol use: No    Alcohol/week: 0.0 standard drinks  . Drug use: No    Allergies  Allergen Reactions  . Lasix [Furosemide] Rash  . Cefzil [Cefprozil] Nausea Only  . Dexamethasone Swelling  . Gabapentin     Legs swell up   . Neomycin Other (See Comments)    Patient can't remember.  . Tetracyclines & Related Itching  . Ciprofloxacin Nausea And Vomiting, Rash and Other (See Comments)    Body aches   . Methocarbamol Swelling and Rash  . Penicillins Rash    Has patient had a PCN reaction causing immediate rash, facial/tongue/throat swelling, SOB or lightheadedness with hypotension: yes Has  patient had a PCN reaction causing severe rash involving mucus membranes or skin necrosis: unknown Has patient had a PCN reaction that required hospitalization: no Has patient had a PCN reaction occurring within the last 10 years: No If all of the above answers are "NO", then may proceed with Cephalosporin use.     Current Outpatient Medications  Medication Sig Dispense Refill  . albuterol (PROVENTIL HFA;VENTOLIN HFA) 108 (90 Base) MCG/ACT inhaler Inhale 2 puffs into the lungs every 6 (six) hours as needed for wheezing or shortness of breath. (Patient not taking: Reported on 07/11/2018) 1 Inhaler 5  . albuterol (PROVENTIL) (2.5 MG/3ML) 0.083% nebulizer solution INHALE 1 VIAL VIA NEBULIZER FOUR TIMES DAILY. 375 mL 0  . cetirizine (ZYRTEC) 10 MG tablet Take 10 mg by mouth daily.    Marland Kitchen esomeprazole (NEXIUM) 20 MG capsule Take 20 mg by mouth daily at 12 noon.    Marland Kitchen FLOVENT HFA 220 MCG/ACT inhaler INHALE 2 PUFFS INTO LUNGS 2 TIMES DAILY. (Patient not taking: Reported on 07/11/2018) 12 g 0  . LORazepam (ATIVAN) 1 MG tablet TAKE (1) TABLET BY MOUTH AT BEDTIME. (Patient taking differently: Take 0.5 mg by mouth at bedtime. ) 30 tablet 5  . losartan (COZAAR) 25 MG tablet TAKE ONE TABLET BY MOUTH ONCE DAILY. 90 tablet 3  . lovastatin (MEVACOR) 40 MG tablet TAKE 2 TABLETS BY MOUTH DAILY. 60 tablet 5  . montelukast (SINGULAIR) 10 MG tablet TAKE ONE TABLET BY MOUTH AT BEDTIME AS NEEDED FOR ALLERGIES. (Patient not taking: Reported on 07/11/2018) 30 tablet 5  . Multiple Vitamin (MULTIVITAMIN) tablet Take 1 tablet by mouth daily.    . mupirocin ointment (BACTROBAN) 2 % APPLY TO AFFECTED AREAS TWICE DAILY. (Patient not taking: Reported on 07/11/2018) 15 g 0  . nitroGLYCERIN (NITROSTAT) 0.4 MG SL tablet PLACE 1 TAB UNDER TONGUE EVERY 5 MIN IF NEEDED FOR CHEST PAIN. MAY USE 3 TIMES.NO RELIEF CALL 911. (Patient not taking: Reported on 07/11/2018) 25 tablet 5  . Probiotic Product (PROBIOTIC DAILY PO) Take by mouth.    .  sucralfate (CARAFATE) 1 g tablet Take 1 g by mouth 2 (two) times daily.     . tamsulosin (FLOMAX) 0.4 MG CAPS capsule One po QHS (Patient not taking: Reported on 07/11/2018) 30 capsule 5   No current facility-administered medications for this visit.      Physical Exam  Blood pressure 115/74, pulse 80, height 6\' 1"  (1.854 m), weight 200 lb (90.7 kg).  Constitutional: overall normal hygiene, normal nutrition, well developed, normal grooming, normal body habitus. Assistive device:none  Musculoskeletal: gait and station Limp none, muscle tone and strength are  normal, no tremors or atrophy is present.  .  Neurological: coordination overall normal.  Deep tendon reflex/nerve stretch intact.  Sensation normal.  Cranial nerves II-XII intact.   Skin:   Normal overall no scars, lesions, ulcers or rashes. No psoriasis.  Psychiatric: Alert and oriented x 3.  Recent memory intact, remote memory unclear.  Normal mood and affect. Well groomed.  Good eye contact.  Cardiovascular: overall no swelling, no varicosities, no edema bilaterally, normal temperatures of the legs and arms, no clubbing, cyanosis and good capillary refill.  Lymphatic: palpation is normal.  Left shoulder has full motion, but pain in the extremes.  NV intact. ROM of neck is full.  All other systems reviewed and are negative   The patient has been educated about the nature of the problem(s) and counseled on treatment options.  The patient appeared to understand what I have discussed and is in agreement with it.  Encounter Diagnosis  Name Primary?  . Pain in joint of left shoulder Yes     PLAN Call if any problems.  Precautions discussed.  Continue current medications.   Return to clinic prn   Electronically Signed Sanjuana Kava, MD 3/10/20202:26 PM

## 2018-07-26 ENCOUNTER — Other Ambulatory Visit: Payer: Self-pay | Admitting: Family Medicine

## 2018-07-26 NOTE — Telephone Encounter (Signed)
Medication list states patient is not currently taking. Left message to return call

## 2018-08-24 ENCOUNTER — Ambulatory Visit (INDEPENDENT_AMBULATORY_CARE_PROVIDER_SITE_OTHER): Payer: Medicare Other | Admitting: Family Medicine

## 2018-08-24 ENCOUNTER — Other Ambulatory Visit: Payer: Self-pay

## 2018-08-24 DIAGNOSIS — R3912 Poor urinary stream: Secondary | ICD-10-CM

## 2018-08-24 DIAGNOSIS — N410 Acute prostatitis: Secondary | ICD-10-CM | POA: Diagnosis not present

## 2018-08-24 DIAGNOSIS — N401 Enlarged prostate with lower urinary tract symptoms: Secondary | ICD-10-CM | POA: Diagnosis not present

## 2018-08-24 MED ORDER — TAMSULOSIN HCL 0.4 MG PO CAPS: ORAL_CAPSULE | ORAL | 5 refills | Status: DC

## 2018-08-24 MED ORDER — SULFAMETHOXAZOLE-TRIMETHOPRIM 800-160 MG PO TABS: 1.0000 | ORAL_TABLET | Freq: Two times a day (BID) | ORAL | 0 refills | Status: DC

## 2018-08-24 NOTE — Progress Notes (Signed)
   Subjective:    Patient ID: [name] [name], male    DOB: [birth year], 78 y.o.   MRN: [id number] Telephone visit Video not capable Patient at home We were at the office Coronavirus outbreak  HPI  Patient calls with dysuria for several weeks. Patient has a history of prostate infection and was set to get the urologist April 1 but the appointment got moved to May due to [place]. Patient relates that he is having urgency and a pressure feeling along with some dysuria he also states he is getting up multiple times at night urinating he denies any severe back pain just some mild back pain denies hematuria no high fever chills or sweats no nausea vomiting or diarrhea is able to get a fair urinary flow but having to pee frequently during the day and definitely peeing more frequent at night he has been treated multiple different times for prostatitis and also has BPH  Virtual Visit via Video Note  I connected with [name] [name] on 08/24/18 at  2:00 PM EDT by a video enabled telemedicine application and verified that I am speaking with the correct person using two identifiers.   I discussed the limitations of evaluation and management by telemedicine and the availability of in person appointments. The patient expressed understanding and agreed to proceed.  History of Present Illness:    Observations/Objective:   Assessment and Plan:   Follow Up Instructions:    I discussed the assessment and treatment plan with the patient. The patient was provided an opportunity to ask questions and all were answered. The patient agreed with the plan and demonstrated an understanding of the instructions.   The patient was advised to call back or seek an in-person evaluation if the symptoms worsen or if the condition fails to improve as anticipated.  I provided 14 minutes of non-face-to-face time during this encounter.     Review of Systems     Objective:   Physical Exam   Unable to accomplish  physical exam due to telephone interaction     Assessment & Plan:  This sounds like a mild prostatitis along with BPH I recommend reinitiating the Flomax side effects were discussed in addition to this antibiotics twice daily over the course the next 3 weeks if not completely cleared up at the end of this to notify us otherwise follow-up with urology as planned warning signs were discussed if high fever severe flank pain vomiting or other problems notify us also if any rashes or blistering with medications immediately stop medicine and notify us patient has taken both medicines before

## 2018-09-11 ENCOUNTER — Other Ambulatory Visit: Payer: Self-pay | Admitting: Family Medicine

## 2018-09-14 DIAGNOSIS — K219 Gastro-esophageal reflux disease without esophagitis: Secondary | ICD-10-CM | POA: Diagnosis not present

## 2018-09-19 ENCOUNTER — Telehealth: Payer: Self-pay | Admitting: Cardiovascular Disease

## 2018-09-19 DIAGNOSIS — I714 Abdominal aortic aneurysm, without rupture, unspecified: Secondary | ICD-10-CM

## 2018-09-19 DIAGNOSIS — I10 Essential (primary) hypertension: Secondary | ICD-10-CM

## 2018-09-19 NOTE — Telephone Encounter (Signed)
Patient called wanting to know when he needs his next US ABDOMINAL AORTA SCREENING AAA

## 2018-09-19 NOTE — Telephone Encounter (Signed)
Pt notified that [place] AAA screening is due in Aug of 2020. Will have front office staff call to schedule.

## 2018-09-20 DIAGNOSIS — R197 Diarrhea, unspecified: Secondary | ICD-10-CM | POA: Diagnosis not present

## 2018-09-20 DIAGNOSIS — R109 Unspecified abdominal pain: Secondary | ICD-10-CM | POA: Diagnosis not present

## 2018-09-21 ENCOUNTER — Other Ambulatory Visit: Payer: Self-pay

## 2018-09-21 ENCOUNTER — Ambulatory Visit
Admission: RE | Admit: 2018-09-21 | Discharge: 2018-09-21 | Disposition: A | Discharge status: Home / Self Care | Payer: Medicare Other | Source: Ambulatory Visit | Attending: Gastroenterology | Admitting: Gastroenterology

## 2018-09-21 ENCOUNTER — Other Ambulatory Visit: Payer: Self-pay | Admitting: Gastroenterology

## 2018-09-21 DIAGNOSIS — M79642 Pain in left hand: Secondary | ICD-10-CM | POA: Diagnosis not present

## 2018-09-21 DIAGNOSIS — I252 Old myocardial infarction: Secondary | ICD-10-CM | POA: Diagnosis not present

## 2018-09-21 DIAGNOSIS — M50321 Other cervical disc degeneration at C4-C5 level: Secondary | ICD-10-CM | POA: Diagnosis not present

## 2018-09-21 DIAGNOSIS — M5136 Other intervertebral disc degeneration, lumbar region: Secondary | ICD-10-CM | POA: Diagnosis not present

## 2018-09-21 DIAGNOSIS — M5030 Other cervical disc degeneration, unspecified cervical region: Secondary | ICD-10-CM | POA: Diagnosis not present

## 2018-09-21 DIAGNOSIS — I10 Essential (primary) hypertension: Secondary | ICD-10-CM | POA: Diagnosis not present

## 2018-09-21 DIAGNOSIS — R109 Unspecified abdominal pain: Secondary | ICD-10-CM

## 2018-09-21 DIAGNOSIS — K589 Irritable bowel syndrome without diarrhea: Secondary | ICD-10-CM | POA: Diagnosis not present

## 2018-09-21 DIAGNOSIS — M50323 Other cervical disc degeneration at C6-C7 level: Secondary | ICD-10-CM | POA: Diagnosis not present

## 2018-09-21 DIAGNOSIS — M542 Cervicalgia: Secondary | ICD-10-CM | POA: Diagnosis not present

## 2018-09-21 DIAGNOSIS — L409 Psoriasis, unspecified: Secondary | ICD-10-CM | POA: Diagnosis not present

## 2018-09-21 DIAGNOSIS — M180 Bilateral primary osteoarthritis of first carpometacarpal joints: Secondary | ICD-10-CM | POA: Diagnosis not present

## 2018-09-21 DIAGNOSIS — R197 Diarrhea, unspecified: Secondary | ICD-10-CM

## 2018-09-21 DIAGNOSIS — E785 Hyperlipidemia, unspecified: Secondary | ICD-10-CM | POA: Diagnosis not present

## 2018-09-21 DIAGNOSIS — M064 Inflammatory polyarthropathy: Secondary | ICD-10-CM | POA: Diagnosis not present

## 2018-09-21 DIAGNOSIS — M1990 Unspecified osteoarthritis, unspecified site: Secondary | ICD-10-CM | POA: Diagnosis not present

## 2018-09-21 DIAGNOSIS — M79641 Pain in right hand: Secondary | ICD-10-CM | POA: Diagnosis not present

## 2018-09-22 ENCOUNTER — Other Ambulatory Visit: Payer: Self-pay | Admitting: Family Medicine

## 2018-09-22 DIAGNOSIS — R351 Nocturia: Secondary | ICD-10-CM | POA: Diagnosis not present

## 2018-09-22 DIAGNOSIS — N401 Enlarged prostate with lower urinary tract symptoms: Secondary | ICD-10-CM | POA: Diagnosis not present

## 2018-09-25 NOTE — Telephone Encounter (Signed)
Six mo weorth

## 2018-09-26 ENCOUNTER — Telehealth: Payer: Self-pay | Admitting: Family Medicine

## 2018-09-26 NOTE — Telephone Encounter (Signed)
Pt calling to request refill on LORazepam (ATIVAN) 1 MG tablet   States previous prescription had a refill that expired   Please advise & call pt    [facility]

## 2018-09-26 NOTE — Telephone Encounter (Signed)
Thought we did this [place] ok six mo

## 2018-09-26 NOTE — Telephone Encounter (Signed)
Prescription printed and awaiting signature

## 2018-09-27 ENCOUNTER — Other Ambulatory Visit: Payer: Self-pay | Admitting: Family Medicine

## 2018-09-28 NOTE — Telephone Encounter (Signed)
[facility] states they received the prescription for the patient.

## 2018-10-02 DIAGNOSIS — K589 Irritable bowel syndrome without diarrhea: Secondary | ICD-10-CM | POA: Diagnosis not present

## 2018-10-02 DIAGNOSIS — I10 Essential (primary) hypertension: Secondary | ICD-10-CM | POA: Diagnosis not present

## 2018-10-02 DIAGNOSIS — L409 Psoriasis, unspecified: Secondary | ICD-10-CM | POA: Diagnosis not present

## 2018-10-02 DIAGNOSIS — M5136 Other intervertebral disc degeneration, lumbar region: Secondary | ICD-10-CM | POA: Diagnosis not present

## 2018-10-02 DIAGNOSIS — I252 Old myocardial infarction: Secondary | ICD-10-CM | POA: Diagnosis not present

## 2018-10-02 DIAGNOSIS — M542 Cervicalgia: Secondary | ICD-10-CM | POA: Diagnosis not present

## 2018-10-02 DIAGNOSIS — M1990 Unspecified osteoarthritis, unspecified site: Secondary | ICD-10-CM | POA: Diagnosis not present

## 2018-10-02 DIAGNOSIS — M064 Inflammatory polyarthropathy: Secondary | ICD-10-CM | POA: Diagnosis not present

## 2018-10-02 DIAGNOSIS — M5030 Other cervical disc degeneration, unspecified cervical region: Secondary | ICD-10-CM | POA: Diagnosis not present

## 2018-10-02 DIAGNOSIS — E785 Hyperlipidemia, unspecified: Secondary | ICD-10-CM | POA: Diagnosis not present

## 2018-10-02 NOTE — Addendum Note (Signed)
Addended by: Levonne Hubert on: 10/02/2018 03:26 PM   Modules accepted: Orders

## 2018-10-18 ENCOUNTER — Other Ambulatory Visit: Payer: Self-pay | Admitting: Podiatry

## 2018-10-18 ENCOUNTER — Encounter: Payer: Self-pay | Admitting: Podiatry

## 2018-10-18 ENCOUNTER — Other Ambulatory Visit: Payer: Self-pay

## 2018-10-18 ENCOUNTER — Ambulatory Visit (INDEPENDENT_AMBULATORY_CARE_PROVIDER_SITE_OTHER): Payer: Medicare Other | Admitting: Podiatry

## 2018-10-18 ENCOUNTER — Ambulatory Visit (INDEPENDENT_AMBULATORY_CARE_PROVIDER_SITE_OTHER): Payer: Medicare Other

## 2018-10-18 VITALS — Temp 98.4°F

## 2018-10-18 DIAGNOSIS — M722 Plantar fascial fibromatosis: Secondary | ICD-10-CM | POA: Diagnosis not present

## 2018-10-18 DIAGNOSIS — M79672 Pain in left foot: Secondary | ICD-10-CM

## 2018-10-18 MED ORDER — TRIAMCINOLONE ACETONIDE 10 MG/ML IJ SUSP
10.0000 mg | Freq: Once | INTRAMUSCULAR | Status: AC
  Administered 2018-10-18: 10 mg

## 2018-10-18 NOTE — Progress Notes (Signed)
Subjective:   Patient ID: [name] [name], male   DOB: 78 y.o.   MRN: [id number]   HPI Patient presents stating her his left heel has been killing him the last couple weeks and does not remember injury.  He is active in his right heel at this it happened to several years ago   ROS      Objective:  Physical Exam  Neurovascular status intact with acute discomfort medial fascial band and central band left plantar fascia at the insertion calcaneus with patient noted to have cavus foot structure     Assessment:  Acute plantar fasciitis left with inflammation fluid at the insertion     Plan:  H&P condition reviewed and injected the fascia 3 mg Kenalog 5 mg Xylocaine advised on ice therapy physical therapy and reappoint to recheck  X-ray indicates that there is a high arch foot structure no indications of stress fracture or current spur formation

## 2018-10-23 ENCOUNTER — Telehealth: Payer: Self-pay | Admitting: Orthopaedic Surgery

## 2018-10-23 ENCOUNTER — Other Ambulatory Visit: Payer: Self-pay | Admitting: Family Medicine

## 2018-10-23 NOTE — Telephone Encounter (Signed)
Patient called wanting to see you for his shoulder hoping to get an injection. He stated he got an injection in his heel last week and didn't know if he could get an injection this soon.  Please advise

## 2018-10-24 NOTE — Telephone Encounter (Signed)
I can see.  Schedule.

## 2018-10-26 ENCOUNTER — Encounter: Payer: Self-pay | Admitting: Orthopaedic Surgery

## 2018-10-26 ENCOUNTER — Encounter: Payer: Medicare Other | Admitting: Orthopaedic Surgery

## 2018-10-26 ENCOUNTER — Other Ambulatory Visit: Payer: Self-pay

## 2018-10-26 ENCOUNTER — Telehealth: Payer: Self-pay | Admitting: Cardiovascular Disease

## 2018-10-26 NOTE — Telephone Encounter (Signed)
Pre-cert Verification for the following procedure   AAA scheduled for 11-22-2018  At [facility]    ````

## 2018-11-01 ENCOUNTER — Ambulatory Visit (INDEPENDENT_AMBULATORY_CARE_PROVIDER_SITE_OTHER): Payer: Medicare Other | Admitting: Podiatry

## 2018-11-01 ENCOUNTER — Encounter: Payer: Self-pay | Admitting: Podiatry

## 2018-11-01 ENCOUNTER — Other Ambulatory Visit: Payer: Self-pay

## 2018-11-01 ENCOUNTER — Other Ambulatory Visit: Payer: Self-pay | Admitting: Cardiovascular Disease

## 2018-11-01 VITALS — Temp 98.1°F

## 2018-11-01 DIAGNOSIS — M722 Plantar fascial fibromatosis: Secondary | ICD-10-CM

## 2018-11-01 DIAGNOSIS — Z136 Encounter for screening for cardiovascular disorders: Secondary | ICD-10-CM

## 2018-11-01 NOTE — Progress Notes (Signed)
Subjective:   Patient ID: [name] [name], male   DOB: 78 y.o.   MRN: [id number]   HPI Patient states it is improved some but it still sore and I feel like I need support   ROS      Objective:  Physical Exam  Neurovascular status intact with patient found to have exquisite discomfort left plantar heel at the insertional point tendon calcaneus with patient also noted to have moderate depression of the arch     Assessment:  Continue plantar fasciitis left with moderate improvement but still painful     Plan:  H&P condition reviewed and injected the plantar fascial left 3 mg Kenalog 5 mg Xylocaine and applied fascial brace to lift the arch up with instructions on supportive shoes.  Reappoint to recheck as needed

## 2018-11-22 ENCOUNTER — Other Ambulatory Visit: Payer: Medicare Other

## 2018-11-28 DIAGNOSIS — H33002 Unspecified retinal detachment with retinal break, left eye: Secondary | ICD-10-CM | POA: Diagnosis not present

## 2018-11-28 DIAGNOSIS — H33012 Retinal detachment with single break, left eye: Secondary | ICD-10-CM | POA: Diagnosis not present

## 2018-11-28 DIAGNOSIS — H401111 Primary open-angle glaucoma, right eye, mild stage: Secondary | ICD-10-CM | POA: Diagnosis not present

## 2018-11-28 DIAGNOSIS — H26492 Other secondary cataract, left eye: Secondary | ICD-10-CM | POA: Diagnosis not present

## 2018-11-28 DIAGNOSIS — Z961 Presence of intraocular lens: Secondary | ICD-10-CM | POA: Diagnosis not present

## 2018-11-28 DIAGNOSIS — H33053 Total retinal detachment, bilateral: Secondary | ICD-10-CM | POA: Diagnosis not present

## 2018-11-28 DIAGNOSIS — H401122 Primary open-angle glaucoma, left eye, moderate stage: Secondary | ICD-10-CM | POA: Diagnosis not present

## 2018-11-28 DIAGNOSIS — H04123 Dry eye syndrome of bilateral lacrimal glands: Secondary | ICD-10-CM | POA: Diagnosis not present

## 2018-11-29 DIAGNOSIS — Z79899 Other long term (current) drug therapy: Secondary | ICD-10-CM | POA: Diagnosis not present

## 2018-11-29 DIAGNOSIS — Z883 Allergy status to other anti-infective agents status: Secondary | ICD-10-CM | POA: Diagnosis not present

## 2018-11-29 DIAGNOSIS — M1990 Unspecified osteoarthritis, unspecified site: Secondary | ICD-10-CM | POA: Diagnosis not present

## 2018-11-29 DIAGNOSIS — E7800 Pure hypercholesterolemia, unspecified: Secondary | ICD-10-CM | POA: Diagnosis not present

## 2018-11-29 DIAGNOSIS — H33022 Retinal detachment with multiple breaks, left eye: Secondary | ICD-10-CM | POA: Diagnosis not present

## 2018-11-29 DIAGNOSIS — I10 Essential (primary) hypertension: Secondary | ICD-10-CM | POA: Diagnosis not present

## 2018-11-29 DIAGNOSIS — I2510 Atherosclerotic heart disease of native coronary artery without angina pectoris: Secondary | ICD-10-CM | POA: Diagnosis not present

## 2018-11-29 DIAGNOSIS — I252 Old myocardial infarction: Secondary | ICD-10-CM | POA: Diagnosis not present

## 2018-11-29 DIAGNOSIS — K219 Gastro-esophageal reflux disease without esophagitis: Secondary | ICD-10-CM | POA: Diagnosis not present

## 2018-11-29 DIAGNOSIS — Z881 Allergy status to other antibiotic agents status: Secondary | ICD-10-CM | POA: Diagnosis not present

## 2018-11-29 DIAGNOSIS — J449 Chronic obstructive pulmonary disease, unspecified: Secondary | ICD-10-CM | POA: Diagnosis not present

## 2018-11-29 DIAGNOSIS — Z87891 Personal history of nicotine dependence: Secondary | ICD-10-CM | POA: Diagnosis not present

## 2018-11-29 DIAGNOSIS — Z880 Allergy status to penicillin: Secondary | ICD-10-CM | POA: Diagnosis not present

## 2018-12-01 NOTE — Progress Notes (Signed)
This encounter was created in error - please disregard.

## 2018-12-07 ENCOUNTER — Other Ambulatory Visit: Payer: Self-pay | Admitting: Family Medicine

## 2018-12-20 ENCOUNTER — Other Ambulatory Visit: Payer: Medicare Other

## 2018-12-21 ENCOUNTER — Other Ambulatory Visit: Payer: Self-pay | Admitting: Cardiovascular Disease

## 2018-12-21 DIAGNOSIS — I714 Abdominal aortic aneurysm, without rupture, unspecified: Secondary | ICD-10-CM

## 2019-01-04 ENCOUNTER — Other Ambulatory Visit: Payer: Self-pay | Admitting: Family Medicine

## 2019-01-05 NOTE — Telephone Encounter (Signed)
Ok with three ref 

## 2019-01-10 ENCOUNTER — Other Ambulatory Visit: Payer: Self-pay | Admitting: Dermatology

## 2019-01-10 ENCOUNTER — Other Ambulatory Visit: Payer: Medicare Other

## 2019-01-10 DIAGNOSIS — D485 Neoplasm of uncertain behavior of skin: Secondary | ICD-10-CM | POA: Diagnosis not present

## 2019-01-10 DIAGNOSIS — L820 Inflamed seborrheic keratosis: Secondary | ICD-10-CM | POA: Diagnosis not present

## 2019-01-10 DIAGNOSIS — D229 Melanocytic nevi, unspecified: Secondary | ICD-10-CM | POA: Diagnosis not present

## 2019-01-10 DIAGNOSIS — D0462 Carcinoma in situ of skin of left upper limb, including shoulder: Secondary | ICD-10-CM | POA: Diagnosis not present

## 2019-01-10 DIAGNOSIS — L719 Rosacea, unspecified: Secondary | ICD-10-CM | POA: Diagnosis not present

## 2019-01-10 DIAGNOSIS — L570 Actinic keratosis: Secondary | ICD-10-CM | POA: Diagnosis not present

## 2019-01-12 ENCOUNTER — Other Ambulatory Visit: Payer: Self-pay | Admitting: Nurse Practitioner

## 2019-01-12 ENCOUNTER — Telehealth: Payer: Self-pay | Admitting: Family Medicine

## 2019-01-12 MED ORDER — MUPIROCIN 2 % EX OINT: TOPICAL_OINTMENT | CUTANEOUS | 0 refills | Status: DC

## 2019-01-12 NOTE — Telephone Encounter (Signed)
Done

## 2019-01-12 NOTE — Telephone Encounter (Signed)
Please advise. Thank you

## 2019-01-12 NOTE — Telephone Encounter (Signed)
Patient said that a couple years ago that Hoyle Sauer gave him Bactroban for the sores in his nose but the tube has expired and wanted to get a refill.  Told patient that Dr. Richardson Landry was not in and it would be Monday before this message was handled.   [facility]

## 2019-01-14 ENCOUNTER — Other Ambulatory Visit: Payer: Self-pay | Admitting: Family Medicine

## 2019-01-16 NOTE — Telephone Encounter (Signed)
Ok six ref 

## 2019-01-18 DIAGNOSIS — H33022 Retinal detachment with multiple breaks, left eye: Secondary | ICD-10-CM | POA: Diagnosis not present

## 2019-01-18 DIAGNOSIS — H31092 Other chorioretinal scars, left eye: Secondary | ICD-10-CM | POA: Diagnosis not present

## 2019-01-25 ENCOUNTER — Other Ambulatory Visit: Payer: Self-pay | Admitting: Family Medicine

## 2019-01-25 DIAGNOSIS — D045 Carcinoma in situ of skin of trunk: Secondary | ICD-10-CM | POA: Diagnosis not present

## 2019-01-25 DIAGNOSIS — L570 Actinic keratosis: Secondary | ICD-10-CM | POA: Diagnosis not present

## 2019-01-26 NOTE — Telephone Encounter (Signed)
Six mo worth 

## 2019-02-13 ENCOUNTER — Ambulatory Visit (INDEPENDENT_AMBULATORY_CARE_PROVIDER_SITE_OTHER): Payer: Medicare Other | Admitting: Family Medicine

## 2019-02-13 ENCOUNTER — Other Ambulatory Visit: Payer: Self-pay

## 2019-02-13 VITALS — BP 130/70 | Temp 98.0°F | Ht 73.0 in | Wt 194.4 lb

## 2019-02-13 DIAGNOSIS — E782 Mixed hyperlipidemia: Secondary | ICD-10-CM

## 2019-02-13 DIAGNOSIS — Z23 Encounter for immunization: Secondary | ICD-10-CM | POA: Diagnosis not present

## 2019-02-13 DIAGNOSIS — R5383 Other fatigue: Secondary | ICD-10-CM

## 2019-02-13 DIAGNOSIS — I119 Hypertensive heart disease without heart failure: Secondary | ICD-10-CM | POA: Diagnosis not present

## 2019-02-13 DIAGNOSIS — E785 Hyperlipidemia, unspecified: Secondary | ICD-10-CM

## 2019-02-13 DIAGNOSIS — Z0000 Encounter for general adult medical examination without abnormal findings: Secondary | ICD-10-CM | POA: Diagnosis not present

## 2019-02-13 DIAGNOSIS — I10 Essential (primary) hypertension: Secondary | ICD-10-CM | POA: Diagnosis not present

## 2019-02-13 DIAGNOSIS — Z79899 Other long term (current) drug therapy: Secondary | ICD-10-CM | POA: Diagnosis not present

## 2019-02-13 MED ORDER — LOVASTATIN 40 MG PO TABS: 80.0000 mg | ORAL_TABLET | Freq: Every day | ORAL | 5 refills | Status: DC

## 2019-02-13 NOTE — Progress Notes (Addendum)
Subjective:    Patient ID: DELON LEN, male    DOB: December 31, 1940, 78 y.o.   MRN: [id number]  HPI  The patient comes in today for a wellness visit.    A review of their health history was completed.  A review of medications was also completed.  Any needed refills;   Eating habits: not as good as should be  Falls/  MVA accidents in past few months: none  Regular exercise: was walking till retina surgery 11 weeks ago and now none  Specialist pt sees on regular basis: Retina dr, Heart dr and GI dr  Preventative health issues were discussed.   Additional concerns: no feeling well-no energy  Blood pressure medicine and blood pressure levels reviewed today with patient. Compliant with blood pressure medicine. States does not miss a dose. No obvious side effects. Blood pressure generally good when checked elsewhere. Watching salt intake.  Patient continues to take lipid medication regularly. No obvious side effects from it. Generally does not miss a dose. Prior blood work results are reviewed with patient. Patient continues to work on fat intake in diet  Pt states energy is not the best,  Doe not feel well Needs  Blood work doen   Wonders if might be anemic again   pt not able to do his reg  Activities, has hurt his social actvites, and unable to go out to ear   Does not feel he is depressed    Review of Systems  Constitutional: Negative for activity change, appetite change and fever.  HENT: Negative for congestion and rhinorrhea.   Eyes: Negative for discharge.  Respiratory: Negative for cough and wheezing.   Cardiovascular: Negative for chest pain.  Gastrointestinal: Negative for abdominal pain, blood in stool and vomiting.  Genitourinary: Negative for difficulty urinating and frequency.  Musculoskeletal: Negative for neck pain.  Skin: Negative for rash.  Allergic/Immunologic: Negative for environmental allergies and food allergies.  Neurological: Negative for  weakness and headaches.  Psychiatric/Behavioral: Negative for agitation.  All other systems reviewed and are negative.      Objective:   Physical Exam Vitals signs reviewed.  Constitutional:      Appearance: He is well-developed.  HENT:     Head: Normocephalic and atraumatic.     Right Ear: External ear normal.     Left Ear: External ear normal.     Nose: Nose normal.  Eyes:     Pupils: Pupils are equal, round, and reactive to light.  Neck:     Musculoskeletal: Normal range of motion and neck supple.     Thyroid: No thyromegaly.  Cardiovascular:     Rate and Rhythm: Normal rate and regular rhythm.     Heart sounds: Normal heart sounds. No murmur.  Pulmonary:     Effort: Pulmonary effort is normal. No respiratory distress.     Breath sounds: Normal breath sounds. No wheezing.  Abdominal:     General: Bowel sounds are normal. There is no distension.     Palpations: Abdomen is soft. There is no mass.     Tenderness: There is no abdominal tenderness.  Genitourinary:    Penis: Normal.   Musculoskeletal: Normal range of motion.  Lymphadenopathy:     Cervical: No cervical adenopathy.  Skin:    General: Skin is warm and dry.     Findings: No erythema.  Neurological:     Mental Status: He is alert.     Motor: No abnormal muscle tone.  Psychiatric:  Behavior: Behavior normal.        Judgment: Judgment normal.           Assessment & Plan:  Impression 1 wellness exam.  Diet discussed.  Exercise discussed.  Vaccines discussed.  Flu shot given  2.  Hypertension.  Good control discussed maintain same meds compliance discussed  3.  Hyperlipidemia exact status uncertain check blood work discussed  4.  Fatigue ongoing significant patient feels not related to #5  5.  Mood disorder still suffering some grief but states relatively mild and under control and no frank depression or suicidal or homicidal thoughts  #6 COPD.  Patient definitely benefits nebulizer.  When  shortness of breath and wheezing flares up the nebulizer does so much better for him than the metered-dose inhaler   Further recommendations based on blood work medications refilled diet exercise discussed

## 2019-02-14 ENCOUNTER — Ambulatory Visit (INDEPENDENT_AMBULATORY_CARE_PROVIDER_SITE_OTHER): Payer: Medicare Other

## 2019-02-14 ENCOUNTER — Other Ambulatory Visit: Payer: Self-pay | Admitting: Family Medicine

## 2019-02-14 DIAGNOSIS — R5383 Other fatigue: Secondary | ICD-10-CM | POA: Diagnosis not present

## 2019-02-14 DIAGNOSIS — I714 Abdominal aortic aneurysm, without rupture, unspecified: Secondary | ICD-10-CM

## 2019-02-14 DIAGNOSIS — E785 Hyperlipidemia, unspecified: Secondary | ICD-10-CM | POA: Diagnosis not present

## 2019-02-14 DIAGNOSIS — Z79899 Other long term (current) drug therapy: Secondary | ICD-10-CM | POA: Diagnosis not present

## 2019-02-15 LAB — HEPATIC FUNCTION PANEL
ALT: 21 IU/L (ref 0–44)
AST: 24 IU/L (ref 0–40)
Albumin: 4.4 g/dL (ref 3.7–4.7)
Alkaline Phosphatase: 75 IU/L (ref 39–117)
Bilirubin Total: 0.6 mg/dL (ref 0.0–1.2)
Bilirubin, Direct: 0.19 mg/dL (ref 0.00–0.40)
Total Protein: 7.3 g/dL (ref 6.0–8.5)

## 2019-02-15 LAB — CBC WITH DIFFERENTIAL/PLATELET
Basophils Absolute: 0.1 10*3/uL (ref 0.0–0.2)
Basos: 1 %
EOS (ABSOLUTE): 0.4 10*3/uL (ref 0.0–0.4)
Eos: 5 %
Hematocrit: 44.6 % (ref 37.5–51.0)
Hemoglobin: 14.8 g/dL (ref 13.0–17.7)
Immature Grans (Abs): 0 10*3/uL (ref 0.0–0.1)
Immature Granulocytes: 0 %
Lymphocytes Absolute: 1.5 10*3/uL (ref 0.7–3.1)
Lymphs: 18 %
MCH: 28.8 pg (ref 26.6–33.0)
MCHC: 33.2 g/dL (ref 31.5–35.7)
MCV: 87 fL (ref 79–97)
Monocytes Absolute: 0.6 10*3/uL (ref 0.1–0.9)
Monocytes: 8 %
Neutrophils Absolute: 5.6 10*3/uL (ref 1.4–7.0)
Neutrophils: 68 %
Platelets: 200 10*3/uL (ref 150–450)
RBC: 5.13 x10E6/uL (ref 4.14–5.80)
RDW: 13.1 % (ref 11.6–15.4)
WBC: 8.2 10*3/uL (ref 3.4–10.8)

## 2019-02-15 LAB — BASIC METABOLIC PANEL
BUN/Creatinine Ratio: 13 (ref 10–24)
BUN: 13 mg/dL (ref 8–27)
CO2: 28 mmol/L (ref 20–29)
Calcium: 9.6 mg/dL (ref 8.6–10.2)
Chloride: 100 mmol/L (ref 96–106)
Creatinine, Ser: 1.04 mg/dL (ref 0.76–1.27)
GFR calc Af Amer: 80 mL/min/{1.73_m2} (ref >59)
GFR calc non Af Amer: 69 mL/min/{1.73_m2} (ref >59)
Glucose: 101 mg/dL — ABNORMAL HIGH (ref 65–99)
Potassium: 4.3 mmol/L (ref 3.5–5.2)
Sodium: 140 mmol/L (ref 134–144)

## 2019-02-15 LAB — LIPID PANEL
Chol/HDL Ratio: 3.1 ratio (ref 0.0–5.0)
Cholesterol, Total: 117 mg/dL (ref 100–199)
HDL: 38 mg/dL — ABNORMAL LOW (ref >39)
LDL Chol Calc (NIH): 61 mg/dL (ref 0–99)
Triglycerides: 93 mg/dL (ref 0–149)
VLDL Cholesterol Cal: 18 mg/dL (ref 5–40)

## 2019-02-15 LAB — TSH: TSH: 1.23 u[IU]/mL (ref 0.450–4.500)

## 2019-02-18 ENCOUNTER — Encounter: Payer: Self-pay | Admitting: Family Medicine

## 2019-02-21 DIAGNOSIS — H35372 Puckering of macula, left eye: Secondary | ICD-10-CM | POA: Diagnosis not present

## 2019-02-22 NOTE — Progress Notes (Signed)
Patient ID: [name] [name], male   DOB: 1940-11-29, 78 y.o.   MRN: [id number]     [name] [name] is seen today in followup for his coronary artery disease. 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 Olevia Perches I believe back in 1999. His last Myoview in July of 2009 was nonischemic. He has good LV function. His been compliant with his meds. He has a 4.1cm AAA by [place] 02/14/19   He has not had any abdominal pain. He is active. 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 . Also has had some palpitations and monitor showing PAC;s short bursts PSVT  Indicates intolerance to beta blockers toprol and bystolic .  Interestingly diarrhea occurred with both Has had issues with coreg, atenolol, cardizem and ranexa in past. Some nasal Congestion with ARB in past as well   Wife passed a couple of years ago of Pancreatic Cancer and only lasted 6 weeks form diagnosis   No cardiac complaints needs new nitro   ROS: Denies fever, malais, weight loss, blurry vision, decreased visual acuity, cough, sputum, SOB, hemoptysis, pleuritic pain, palpitaitons, heartburn, abdominal pain, melena, lower extremity edema, claudication, or rash.  All other systems reviewed and negative  General: BP 112/73   Pulse 73   Temp (!) 97.3 F (36.3 C)   Ht 6\' 1"  (1.854 m)   Wt 197 lb (89.4 kg)   BMI 25.99 kg/m   Affect appropriate Healthy:  appears stated age 61: hard of hearing Aides in place  Neck supple with no adenopathy JVP normal no bruits no thyromegaly Lungs Mild exp wheezing and good diaphragmatic motion Heart:  S1/S2 no murmur, no rub, gallop or click PMI normal Abdomen: benighn, BS positve, no tenderness, AAA palpable not tender  no bruit.  No HSM or HJR Distal pulses intact with no bruits No edema Neuro non-focal Skin warm and dry No muscular weakness   Current Outpatient Medications  Medication Sig Dispense Refill  . albuterol  (PROVENTIL) (2.5 MG/3ML) 0.083% nebulizer solution INHALE 1 VIAL VIA NEBULIZER FOUR TIMES DAILY. 375 mL 5  . cetirizine (ZYRTEC) 10 MG tablet Take 10 mg by mouth daily.    Marland Kitchen esomeprazole (NEXIUM) 20 MG capsule Take 20 mg by mouth daily at 12 noon.    Marland Kitchen FLOVENT HFA 220 MCG/ACT inhaler INHALE 2 PUFFS INTO LUNGS 2 TIMES DAILY. 12 g 5  . GAVILYTE-N WITH FLAVOR PACK 420 g solution     . latanoprost (XALATAN) 0.005 % ophthalmic solution     . LORazepam (ATIVAN) 1 MG tablet Take 0.5 tablets (0.5 mg total) by mouth at bedtime. 30 tablet 5  . losartan (COZAAR) 25 MG tablet TAKE ONE TABLET BY MOUTH ONCE DAILY. 90 tablet 3  . lovastatin (MEVACOR) 40 MG tablet Take 2 tablets (80 mg total) by mouth daily. 60 tablet 5  . montelukast (SINGULAIR) 10 MG tablet TAKE ONE TABLET BY MOUTH AT BEDTIME AS NEEDED FOR ALLERGIES. 30 tablet 5  . Multiple Vitamin (MULTIVITAMIN) tablet Take 1 tablet by mouth daily.    . nitroGLYCERIN (NITROSTAT) 0.4 MG SL tablet Place 1 tablet (0.4 mg total) under the tongue every 5 (five) minutes as needed for chest pain. 25 tablet 5  . PROAIR HFA 108 (90 Base) MCG/ACT inhaler INHALE 2 PUFFS INTO THE LUNGS EVERY SIX HOURS AS NEEDED FOR WHEEZING. 8.5 g 2  . Probiotic Product (PROBIOTIC DAILY PO)  Take by mouth.     No current facility-administered medications for this visit.     Allergies  Lasix [furosemide], Cefzil [cefprozil], Dexamethasone, Gabapentin, Neomycin, Tetracyclines & related, Ciprofloxacin, Methocarbamol, and Penicillins  Electrocardiogram:  04/07/18  SR PAC;s otherwise normal   Assessment and Plan  CAD:  Distant PCI RCA 1999 with normal myovue 2009  conitnue medical Rx intolerant to beta blocker  Low risk myovue 08/31/16 with mild mid to apical inferior defect partially reversible EF 56% continue medical Rx  AAA:  4.0 cm had CT 06/30/17  Stable 4.1 cm by [place] 02/14/19  HTN:  Improved but have to hold ARB for now   GERD:  Continue carafate and nexium f/u GI  Asthma:  On  inhalers no active wheezing   PAC/SVT:  Stable unable to take beta blockers  Anxiety/Depression:   Has not started on SSRI   ENT:  ARB held hearing worse using Aides   HLD:  On mevacor 40 mg daily LDL 67 at goal    F/U with me 6 months    Jenkins Rouge

## 2019-02-26 ENCOUNTER — Encounter: Payer: Self-pay | Admitting: Cardiovascular Disease

## 2019-02-26 ENCOUNTER — Ambulatory Visit (INDEPENDENT_AMBULATORY_CARE_PROVIDER_SITE_OTHER): Payer: Medicare Other | Admitting: Cardiovascular Disease

## 2019-02-26 ENCOUNTER — Other Ambulatory Visit: Payer: Self-pay

## 2019-02-26 VITALS — BP 112/73 | HR 73 | Temp 97.3°F | Ht 73.0 in | Wt 197.0 lb

## 2019-02-26 DIAGNOSIS — I2510 Atherosclerotic heart disease of native coronary artery without angina pectoris: Secondary | ICD-10-CM

## 2019-02-26 MED ORDER — NITROGLYCERIN 0.4 MG SL SUBL: 0.4000 mg | SUBLINGUAL_TABLET | SUBLINGUAL | 5 refills | Status: DC | PRN

## 2019-03-27 ENCOUNTER — Other Ambulatory Visit: Payer: Self-pay | Admitting: Family Medicine

## 2019-03-28 ENCOUNTER — Other Ambulatory Visit: Payer: Self-pay | Admitting: Family Medicine

## 2019-03-28 ENCOUNTER — Telehealth: Payer: Self-pay | Admitting: Family Medicine

## 2019-03-28 NOTE — Telephone Encounter (Signed)
Script is at nurse station if you agree

## 2019-03-28 NOTE — Telephone Encounter (Signed)
Pt is needing a new nebulizer pump. The one he has is 77 years old and starting to not work well. Ins will cover a new one if we send a script over to [facility] and office notes.

## 2019-03-28 NOTE — Telephone Encounter (Signed)
Six mo worth 

## 2019-03-28 NOTE — Telephone Encounter (Signed)
ok 

## 2019-04-03 ENCOUNTER — Telehealth: Payer: Self-pay | Admitting: Family Medicine

## 2019-04-03 NOTE — Telephone Encounter (Signed)
Please advise. Thank you

## 2019-04-03 NOTE — Telephone Encounter (Signed)
[facility] needs office notes to be able to process script for patient on nebulizer pump.

## 2019-04-04 ENCOUNTER — Telehealth: Payer: Self-pay | Admitting: Family Medicine

## 2019-04-04 NOTE — Telephone Encounter (Signed)
[facility] needing face to face notes on why patient needs nebulizer machine and his last office visit notes doesn't mention nebulizer use. Paper work and Market researcher for review.Please advise

## 2019-04-16 ENCOUNTER — Ambulatory Visit (INDEPENDENT_AMBULATORY_CARE_PROVIDER_SITE_OTHER): Payer: Medicare Other | Admitting: Family Medicine

## 2019-04-16 ENCOUNTER — Encounter: Payer: Self-pay | Admitting: Family Medicine

## 2019-04-16 ENCOUNTER — Other Ambulatory Visit: Payer: Self-pay

## 2019-04-16 DIAGNOSIS — Z20822 Contact with and (suspected) exposure to covid-19: Secondary | ICD-10-CM

## 2019-04-16 DIAGNOSIS — I2510 Atherosclerotic heart disease of native coronary artery without angina pectoris: Secondary | ICD-10-CM

## 2019-04-16 DIAGNOSIS — J310 Chronic rhinitis: Secondary | ICD-10-CM | POA: Diagnosis not present

## 2019-04-16 DIAGNOSIS — J329 Chronic sinusitis, unspecified: Secondary | ICD-10-CM | POA: Diagnosis not present

## 2019-04-16 DIAGNOSIS — Z20828 Contact with and (suspected) exposure to other viral communicable diseases: Secondary | ICD-10-CM

## 2019-04-16 MED ORDER — SULFAMETHOXAZOLE-TRIMETHOPRIM 800-160 MG PO TABS: 1.0000 | ORAL_TABLET | Freq: Two times a day (BID) | ORAL | 0 refills | Status: DC

## 2019-04-16 MED ORDER — MAGIC MOUTHWASH: ORAL | 0 refills | Status: DC

## 2019-04-16 NOTE — Progress Notes (Signed)
   Subjective:    Patient ID: [name] [name], male    DOB: Nov 29, 1940, 78 y.o.   MRN: [id number]  Sinus Problem This is a new problem. The current episode started in the past 7 days. Associated symptoms include congestion, coughing, sinus pressure and a sore throat.      Review of Systems  HENT: Positive for congestion, sinus pressure and sore throat.   Respiratory: Positive for cough.    Virtual Visit via Video Note  I connected with [name] [name] on 04/16/19 at 11:00 AM EST by a video enabled telemedicine application and verified that I am speaking with the correct person using two identifiers.  Location: Patient: home Provider: office   I discussed the limitations of evaluation and management by telemedicine and the availability of in person appointments. The patient expressed understanding and agreed to proceed.  History of Present Illness:    Observations/Objective:   Assessment and Plan:   Follow Up Instructions:    I discussed the assessment and treatment plan with the patient. The patient was provided an opportunity to ask questions and all were answered. The patient agreed with the plan and demonstrated an understanding of the instructions.   The patient was advised to call back or seek an in-person evaluation if the symptoms worsen or if the condition fails to improve as anticipated.  I provided 25 minutes of non-face-to-face time during this encounter.  Started a week ago or more  Some sinus drainage  irrit in the throat  Using gargle of salt water  Back of tongue whitish  Some cough  No sig sob      Objective:   Physical Exam  Virtual     Assessment & Plan:  Impression rhinosinusitis/bronchitis.  With mild exacerbation potential for COVID-19 discussed with the patient.  Antibiotics prescribed.  Screening tests encouraged

## 2019-04-23 ENCOUNTER — Other Ambulatory Visit: Payer: Self-pay | Admitting: Cardiovascular Disease

## 2019-04-30 DIAGNOSIS — H43811 Vitreous degeneration, right eye: Secondary | ICD-10-CM | POA: Diagnosis not present

## 2019-04-30 DIAGNOSIS — H35352 Cystoid macular degeneration, left eye: Secondary | ICD-10-CM | POA: Diagnosis not present

## 2019-04-30 DIAGNOSIS — H31092 Other chorioretinal scars, left eye: Secondary | ICD-10-CM | POA: Diagnosis not present

## 2019-04-30 DIAGNOSIS — H35372 Puckering of macula, left eye: Secondary | ICD-10-CM | POA: Diagnosis not present

## 2019-05-16 ENCOUNTER — Telehealth: Payer: Self-pay | Admitting: Family Medicine

## 2019-05-16 NOTE — Telephone Encounter (Signed)
[facility] states that medicare requires recent medical notes describing medical need for ongoing use of albuterol thru nebulizer and they need those notes before it can be filled must state condition used for prognosis and overall length of need -these will need to be sent every 3-6 month for billing. The albuterol inhaler does not require this per [facility] please advise   Fax # [phone]

## 2019-05-16 NOTE — Telephone Encounter (Signed)
Notify pt we had already changed noted in compliance with his pharmacy's request. We will send [phone] note again to [facility] today.

## 2019-05-16 NOTE — Telephone Encounter (Signed)
Pt states he requested refill on albuterol (PROVENTIL) (2.5 MG/3ML) 0.083% nebulizer solution last week from pharmacy. They state they are waiting on a paper from our office to be able to file the insurance. He is almost out and checking on status of the form.

## 2019-05-16 NOTE — Telephone Encounter (Signed)
Patient notified and will call back if any further problems.

## 2019-05-16 NOTE — Telephone Encounter (Signed)
Left message to return call 

## 2019-05-24 ENCOUNTER — Ambulatory Visit: Payer: Medicare Other | Attending: Internal Medicine

## 2019-05-24 ENCOUNTER — Other Ambulatory Visit: Payer: Self-pay

## 2019-05-24 DIAGNOSIS — Z20822 Contact with and (suspected) exposure to covid-19: Secondary | ICD-10-CM

## 2019-05-26 LAB — NOVEL CORONAVIRUS, NAA: SARS-CoV-2, NAA: NOT DETECTED

## 2019-06-01 ENCOUNTER — Other Ambulatory Visit: Payer: Self-pay

## 2019-06-01 ENCOUNTER — Ambulatory Visit (INDEPENDENT_AMBULATORY_CARE_PROVIDER_SITE_OTHER): Payer: Medicare Other | Admitting: Family Medicine

## 2019-06-01 DIAGNOSIS — J410 Simple chronic bronchitis: Secondary | ICD-10-CM

## 2019-06-01 MED ORDER — PREDNISONE 20 MG PO TABS: ORAL_TABLET | ORAL | 0 refills | Status: DC

## 2019-06-01 MED ORDER — BUDESONIDE-FORMOTEROL FUMARATE 160-4.5 MCG/ACT IN AERO: 2.0000 | INHALATION_SPRAY | Freq: Two times a day (BID) | RESPIRATORY_TRACT | 3 refills | Status: DC

## 2019-06-01 MED ORDER — FLUCONAZOLE 200 MG PO TABS: ORAL_TABLET | ORAL | 0 refills | Status: DC

## 2019-06-01 MED ORDER — SULFAMETHOXAZOLE-TRIMETHOPRIM 800-160 MG PO TABS: ORAL_TABLET | ORAL | 0 refills | Status: DC

## 2019-06-01 NOTE — Progress Notes (Signed)
   Subjective:    Patient ID: [name] [name], male    DOB: November 17, 1940, 79 y.o.   MRN: [id number]  HPI Pt is having flare up of COPD. Pt states around the first of November he has have been having to use albuterol nebulizer more. Does help some but has to use neb more. Left lung feels congested and pt has had a runny nose. Pt had thrush a couple months ago and was prescribed Dukes Magic Mouthwash and that helped him.  Virtual Visit via Telephone Note  I connected with [name] [name] on 06/01/19 at 11:30 AM EST by telephone and verified that I am speaking with the correct person using two identifiers.  Location: Patient: home Provider: office   I discussed the limitations, risks, security and privacy concerns of performing an evaluation and management service by telephone and the availability of in person appointments. I also discussed with the patient that there may be a patient responsible charge related to this service. The patient expressed understanding and agreed to proceed.   History of Present Illness:    Observations/Objective:   Assessment and Plan:   Follow Up Instructions:    I discussed the assessment and treatment plan with the patient. The patient was provided an opportunity to ask questions and all were answered. The patient agreed with the plan and demonstrated an understanding of the instructions.   The patient was advised to call back or seek an in-person evaluation if the symptoms worsen or if the condition fails to improve as anticipated.  I provided 16 minutes of non-face-to-face time during this encounter.       Review of Systems  Constitutional: Negative for activity change, chills and fever.  HENT: Positive for congestion and rhinorrhea. Negative for ear pain.   Eyes: Negative for discharge.  Respiratory: Positive for cough. Negative for wheezing.   Cardiovascular: Negative for chest pain.  Gastrointestinal: Negative for nausea and vomiting.    Musculoskeletal: Negative for arthralgias.       Objective:   Physical Exam    Today's visit was via telephone Physical exam was not possible for this visit      Assessment & Plan:  COPD exacerbation  Short course prednisone taper Continue albuterol neb every 4 as needed Stop Flovent-has not been using lately Recommend Symbicort 2 puffs twice daily  Thrush-Diflucan for 1 week May have an esophageal component should get better with this  We will go ahead with an antibiotic as well with Bactrim twice daily for the next 7 days

## 2019-06-05 ENCOUNTER — Other Ambulatory Visit: Payer: Self-pay

## 2019-06-05 ENCOUNTER — Ambulatory Visit (INDEPENDENT_AMBULATORY_CARE_PROVIDER_SITE_OTHER): Payer: Medicare Other | Admitting: Family Medicine

## 2019-06-05 DIAGNOSIS — J310 Chronic rhinitis: Secondary | ICD-10-CM | POA: Diagnosis not present

## 2019-06-05 DIAGNOSIS — J329 Chronic sinusitis, unspecified: Secondary | ICD-10-CM | POA: Diagnosis not present

## 2019-06-05 DIAGNOSIS — J449 Chronic obstructive pulmonary disease, unspecified: Secondary | ICD-10-CM | POA: Diagnosis not present

## 2019-06-05 MED ORDER — SULFAMETHOXAZOLE-TRIMETHOPRIM 800-160 MG PO TABS: ORAL_TABLET | ORAL | 0 refills | Status: DC

## 2019-06-05 MED ORDER — MUPIROCIN 2 % EX OINT: TOPICAL_OINTMENT | CUTANEOUS | 0 refills | Status: DC

## 2019-06-05 MED ORDER — MAGIC MOUTHWASH: ORAL | 0 refills | Status: DC

## 2019-06-05 NOTE — Progress Notes (Signed)
   Subjective:  Audiovideo  Patient ID: [name] [name], male    DOB: [birth year], 79 y.o.   MRN: [id number]  HPI Pt is having irritation and nose bleeds. Pt had phone visit with Dr.Scott on Friday. Pt was prescribed Symbicort, Bactrim, Fluconazole and Prednisone. Pt states he took the Symbicort twice but it made the irritation worse but made breathing better. Pt voice is raspy and is having a dry cough. Nose bleeds started Sunday and Monday.   Virtual Visit via Telephone Note  I connected with [name] on 06/05/19 at  2:30 PM EST by telephone and verified that I am speaking with the correct person using two identifiers.  Location: Patient: home Provider: office   I discussed the limitations, risks, security and privacy concerns of performing an evaluation and management service by telephone and the availability of in person appointments. I also discussed with the patient that there may be a patient responsible charge related to this service. The patient expressed understanding and agreed to proceed.   History of Present Illness:    Observations/Objective:   Assessment and Plan:   Follow Up Instructions:    I discussed the assessment and treatment plan with the patient. The patient was provided an opportunity to ask questions and all were answered. The patient agreed with the plan and demonstrated an understanding of the instructions.   The patient was advised to call back or seek an in-person evaluation if the symptoms worsen or if the condition fails to improve as anticipated.  I provided 20 minutes of non-face-to-face time during this encounter.       Review of Systems No abdominal pain no change in bowel habits no blood in stool    Objective:   Physical Exam  Virtual      Assessment & Plan:  Impression persistent aggravating nasal congestion.  Throat irritation.  History of recurrent sinusitis.  Positive element of epistaxis.  Patient has numerous questions  all answered.  Having difficulty with both Flovent and Symbicort in terms of side effects.  Also difficulty with worsening COPD and daily use of albuterol.  Recommend pulmonary referral.  In the meantime Bactrim DS for 7 more days.  Bactroban to the nasal passages.  Dukes Magic mouthwash.

## 2019-06-06 ENCOUNTER — Other Ambulatory Visit: Payer: Self-pay

## 2019-06-06 ENCOUNTER — Encounter: Payer: Self-pay | Admitting: Family Medicine

## 2019-06-06 ENCOUNTER — Telehealth: Payer: Self-pay | Admitting: Family Medicine

## 2019-06-06 MED ORDER — SULFAMETHOXAZOLE-TRIMETHOPRIM 800-160 MG PO TABS: ORAL_TABLET | ORAL | 0 refills | Status: DC

## 2019-06-06 NOTE — Telephone Encounter (Signed)
Patient had phone visit yesterday and was told another round of Bactrim DS would be called in for him at [facility]. He went to pick up medication and it wasn't there can you resend please.

## 2019-06-06 NOTE — Telephone Encounter (Signed)
Medication resent to pharmacy and pt is aware

## 2019-06-18 ENCOUNTER — Telehealth: Payer: Self-pay | Admitting: Family Medicine

## 2019-06-18 ENCOUNTER — Other Ambulatory Visit: Payer: Self-pay | Admitting: *Deleted

## 2019-06-18 DIAGNOSIS — R490 Dysphonia: Secondary | ICD-10-CM | POA: Diagnosis not present

## 2019-06-18 DIAGNOSIS — J019 Acute sinusitis, unspecified: Secondary | ICD-10-CM | POA: Diagnosis not present

## 2019-06-18 DIAGNOSIS — J3489 Other specified disorders of nose and nasal sinuses: Secondary | ICD-10-CM | POA: Diagnosis not present

## 2019-06-18 MED ORDER — CLINDAMYCIN HCL 150 MG PO CAPS: 150.0000 mg | ORAL_CAPSULE | Freq: Three times a day (TID) | ORAL | 0 refills | Status: DC

## 2019-06-18 NOTE — Telephone Encounter (Signed)
Pt also wanted to ask dr Richardson Landry if he should get his covid vaccine this Wednesday or wait til after he is better.

## 2019-06-18 NOTE — Telephone Encounter (Signed)
Pt.notified

## 2019-06-18 NOTE — Telephone Encounter (Signed)
Patient just finished 14 days of Bactrim.  He said it really didn't help his sinus problems that much.  Still having sinus issues, nose bleeds, and sinus h/a.  He has an appt with Dr. Benjamine Mola next week but wanted to know should he be on another round of antibioitcs?   [facility]

## 2019-06-18 NOTE — Telephone Encounter (Signed)
Discussed with pt and med sent to pharm.  

## 2019-06-18 NOTE — Telephone Encounter (Signed)
Clindamycin 150 tid ten d, add probiotic  due to prolonged antibiotic use and stronger antibiotic

## 2019-06-18 NOTE — Telephone Encounter (Signed)
By all means definitely get the vaccine even if a bit under the weather. It is such a crucial step to getting to a safer place with this deadly virus

## 2019-06-19 DIAGNOSIS — N401 Enlarged prostate with lower urinary tract symptoms: Secondary | ICD-10-CM | POA: Diagnosis not present

## 2019-06-19 DIAGNOSIS — R351 Nocturia: Secondary | ICD-10-CM | POA: Diagnosis not present

## 2019-06-21 ENCOUNTER — Encounter: Payer: Self-pay | Admitting: Family Medicine

## 2019-06-26 DIAGNOSIS — R0982 Postnasal drip: Secondary | ICD-10-CM | POA: Diagnosis not present

## 2019-06-26 DIAGNOSIS — R040 Epistaxis: Secondary | ICD-10-CM | POA: Diagnosis not present

## 2019-06-26 DIAGNOSIS — J324 Chronic pansinusitis: Secondary | ICD-10-CM | POA: Diagnosis not present

## 2019-06-27 DIAGNOSIS — H0102A Squamous blepharitis right eye, upper and lower eyelids: Secondary | ICD-10-CM | POA: Diagnosis not present

## 2019-06-27 DIAGNOSIS — Z961 Presence of intraocular lens: Secondary | ICD-10-CM | POA: Diagnosis not present

## 2019-06-27 DIAGNOSIS — H0102B Squamous blepharitis left eye, upper and lower eyelids: Secondary | ICD-10-CM | POA: Diagnosis not present

## 2019-06-27 DIAGNOSIS — Z9889 Other specified postprocedural states: Secondary | ICD-10-CM | POA: Diagnosis not present

## 2019-06-27 DIAGNOSIS — H04123 Dry eye syndrome of bilateral lacrimal glands: Secondary | ICD-10-CM | POA: Diagnosis not present

## 2019-06-27 DIAGNOSIS — H401122 Primary open-angle glaucoma, left eye, moderate stage: Secondary | ICD-10-CM | POA: Diagnosis not present

## 2019-06-27 DIAGNOSIS — H401111 Primary open-angle glaucoma, right eye, mild stage: Secondary | ICD-10-CM | POA: Diagnosis not present

## 2019-07-17 DIAGNOSIS — R351 Nocturia: Secondary | ICD-10-CM | POA: Diagnosis not present

## 2019-07-17 DIAGNOSIS — N401 Enlarged prostate with lower urinary tract symptoms: Secondary | ICD-10-CM | POA: Diagnosis not present

## 2019-07-26 ENCOUNTER — Ambulatory Visit (INDEPENDENT_AMBULATORY_CARE_PROVIDER_SITE_OTHER): Payer: Medicare Other | Admitting: Orthopaedic Surgery

## 2019-07-26 ENCOUNTER — Other Ambulatory Visit: Payer: Self-pay

## 2019-07-26 ENCOUNTER — Encounter: Payer: Self-pay | Admitting: Orthopaedic Surgery

## 2019-07-26 VITALS — BP 135/88 | HR 79 | Ht 73.0 in | Wt 198.0 lb

## 2019-07-26 DIAGNOSIS — M25512 Pain in left shoulder: Secondary | ICD-10-CM

## 2019-07-26 NOTE — Progress Notes (Signed)
PROCEDURE NOTE:  The patient request injection, verbal consent was obtained.  The left shoulder was prepped appropriately after time out was performed.   Sterile technique was observed and injection of 1 cc of Depo-Medrol 40 mg with several cc's of plain xylocaine. Anesthesia was provided by ethyl chloride and a 20-gauge needle was used to inject the shoulder area. A posterior approach was used.  The injection was tolerated well.  A band aid dressing was applied.  The patient was advised to apply ice later today and tomorrow to the injection sight as needed.  See as needed.  Electronically Signed Sanjuana Kava, MD 3/11/202110:29 AM

## 2019-08-06 ENCOUNTER — Other Ambulatory Visit: Payer: Self-pay

## 2019-08-06 ENCOUNTER — Ambulatory Visit (INDEPENDENT_AMBULATORY_CARE_PROVIDER_SITE_OTHER): Payer: Medicare Other

## 2019-08-06 ENCOUNTER — Ambulatory Visit (INDEPENDENT_AMBULATORY_CARE_PROVIDER_SITE_OTHER): Payer: Medicare Other | Admitting: Pulmonary Disease

## 2019-08-06 ENCOUNTER — Encounter: Payer: Self-pay | Admitting: Pulmonary Disease

## 2019-08-06 VITALS — BP 120/72 | HR 86 | Temp 98.2°F | Ht 73.0 in | Wt 201.8 lb

## 2019-08-06 DIAGNOSIS — J410 Simple chronic bronchitis: Secondary | ICD-10-CM

## 2019-08-06 DIAGNOSIS — R0600 Dyspnea, unspecified: Secondary | ICD-10-CM | POA: Diagnosis not present

## 2019-08-06 MED ORDER — ANORO ELLIPTA 62.5-25 MCG/INH IN AEPB: 1.0000 | INHALATION_SPRAY | Freq: Every day | RESPIRATORY_TRACT | 3 refills | Status: DC

## 2019-08-06 MED ORDER — ANORO ELLIPTA 62.5-25 MCG/INH IN AEPB: 1.0000 | INHALATION_SPRAY | Freq: Every day | RESPIRATORY_TRACT | 0 refills | Status: DC

## 2019-08-06 NOTE — Patient Instructions (Signed)
COPD Dysphonia with inhalers  We will switch you to Anoro-does not have a steroid on it  Continue with rescue inhaler use as needed Nebulizer use as needed  Breathing study Chest x-ray  We will see you in about 6 weeks

## 2019-08-06 NOTE — Progress Notes (Signed)
Subjective:    Patient ID: [name] [name], male    DOB: 07-28-1940, 79 y.o.   MRN: [id number]  Patient with a longstanding history of COPD  Has been having dysphonia with Symbicort Did have dysphonia with Flovent and also just with nebulization Symptoms are more pronounced with use of Symbicort  Diagnosed with emphysema in the 90s  He quit smoking about 2004 Pack a day smoker  Worked in hospital administration  No hobbies no places him at risk of lung disease   He is not limited with activities of daily living, will get short of breath with activities but usually able to complete Able to walk a good distance Will not usually stop with walking up a grade  Past Medical History:  Diagnosis Date  . AAA (abdominal aortic aneurysm) ([place])    needs yearly ultrasound  . Allergy   . Anemia   . Arthritis   . Asthma   . BPH (benign prostatic hyperplasia)   . CAD (coronary artery disease)   . Cancer ([place])    skin cancer  . COPD (chronic obstructive pulmonary disease) ([place])   . Dysrhythmia    pt. states it can be fast at times  . GERD (gastroesophageal reflux disease)   . Glaucoma   . HOH (hard of hearing)   . Hypercholesterolemia   . Hypertension   . Impaired fasting glucose   . Low back pain   . MI (myocardial infarction) ([place]) 1999  . Thrush    Social History   Socioeconomic History  . Marital status: Married    Spouse name: Not on file  . Number of children: Not on file  . Years of education: Not on file  . Highest education level: Not on file  Occupational History  . Not on file  Tobacco Use  . Smoking status: Former Smoker    Packs/day: 1.00    Years: 35.00    Pack years: 35.00    Types: Cigarettes    Quit date: 03/28/2003    Years since quitting: 16.3  . Smokeless tobacco: Never Used  Substance and Sexual Activity  . Alcohol use: No    Alcohol/week: 0.0 standard drinks  . Drug use: No  . Sexual activity: Yes    Birth control/protection: None  Other  Topics Concern  . Not on file  Social History Narrative  . Not on file   Social Determinants of Health   Financial Resource Strain:   . Difficulty of Paying Living Expenses:   Food Insecurity:   . Worried About Charity fundraiser in the Last Year:   . Arboriculturist in the Last Year:   Transportation Needs:   . Film/video editor (Medical):   Marland Kitchen Lack of Transportation (Non-Medical):   Physical Activity:   . Days of Exercise per Week:   . Minutes of Exercise per Session:   Stress:   . Feeling of Stress :   Social Connections:   . Frequency of Communication with Friends and Family:   . Frequency of Social Gatherings with Friends and Family:   . Attends Religious Services:   . Active Member of Clubs or Organizations:   . Attends Archivist Meetings:   Marland Kitchen Marital Status:   Intimate Partner Violence:   . Fear of Current or Ex-Partner:   . Emotionally Abused:   Marland Kitchen Physically Abused:   . Sexually Abused:    Family History  Problem Relation Age of Onset  .  Hypertension Mother   . COPD Father   . Cancer Brother        brain   Review of Systems  Respiratory: Positive for cough, shortness of breath and wheezing.   Musculoskeletal: Positive for arthralgias.      Objective:   Physical Exam Constitutional:      Appearance: Normal appearance.  HENT:     Head: Normocephalic.     Nose: Nose normal. No congestion.     Mouth/Throat:     Mouth: Mucous membranes are moist.  Eyes:     Pupils: Pupils are equal, round, and reactive to light.  Cardiovascular:     Rate and Rhythm: Normal rate and regular rhythm.     Pulses: Normal pulses.     Heart sounds: Normal heart sounds. No murmur. No friction rub.  Pulmonary:     Effort: Pulmonary effort is normal. No respiratory distress.     Breath sounds: Normal breath sounds. No stridor. No wheezing or rhonchi.  Musculoskeletal:        General: No swelling. Normal range of motion.     Cervical back: Normal range of  motion and neck supple. No rigidity.  Neurological:     Mental Status: He is alert.  Psychiatric:        Mood and Affect: Mood normal.    Vitals:   08/06/19 1436  BP: 120/72  Pulse: 86  Temp: 98.2 F (36.8 C)  SpO2: 96%      Assessment & Plan:  .  COPD .  Dysphonia  Plan .  Switch to anticholinergic -I did discuss the possible side effects/effect of anticholinergic with BPH-he is aware, will pay attention to his symptoms -Continue rescue inhaler use as needed  -Will start him on Anoro  -We will get a chest x-ray on him today -Schedule him for PFT-last PFT was about 2000  -Encouraged to call with any significant symptoms -If he notices any changes in his urine stream with Anoro, encouraged to stop and give [place] a call

## 2019-08-13 ENCOUNTER — Other Ambulatory Visit: Payer: Self-pay

## 2019-08-13 ENCOUNTER — Encounter: Payer: Self-pay | Admitting: Family Medicine

## 2019-08-13 ENCOUNTER — Ambulatory Visit (INDEPENDENT_AMBULATORY_CARE_PROVIDER_SITE_OTHER): Payer: Medicare Other | Admitting: Family Medicine

## 2019-08-13 VITALS — BP 136/84 | Temp 97.6°F | Ht 73.0 in | Wt 202.6 lb

## 2019-08-13 DIAGNOSIS — E785 Hyperlipidemia, unspecified: Secondary | ICD-10-CM

## 2019-08-13 DIAGNOSIS — Z79899 Other long term (current) drug therapy: Secondary | ICD-10-CM

## 2019-08-13 DIAGNOSIS — J449 Chronic obstructive pulmonary disease, unspecified: Secondary | ICD-10-CM | POA: Diagnosis not present

## 2019-08-13 DIAGNOSIS — I10 Essential (primary) hypertension: Secondary | ICD-10-CM | POA: Diagnosis not present

## 2019-08-13 DIAGNOSIS — F5101 Primary insomnia: Secondary | ICD-10-CM

## 2019-08-13 DIAGNOSIS — Z131 Encounter for screening for diabetes mellitus: Secondary | ICD-10-CM

## 2019-08-13 MED ORDER — LOVASTATIN 40 MG PO TABS: 40.0000 mg | ORAL_TABLET | Freq: Every day | ORAL | 5 refills | Status: DC

## 2019-08-13 MED ORDER — LOSARTAN POTASSIUM 25 MG PO TABS: 25.0000 mg | ORAL_TABLET | Freq: Every day | ORAL | 1 refills | Status: DC

## 2019-08-13 MED ORDER — LORAZEPAM 1 MG PO TABS: 0.5000 mg | ORAL_TABLET | Freq: Every day | ORAL | 5 refills | Status: DC

## 2019-08-13 NOTE — Progress Notes (Signed)
   Subjective:  Patient arrives for 33-month follow-up with multiple challenges  Patient ID: [name] [name], male    DOB: [birth year], 79 y.o.   MRN: [id number]  Hypertension This is a chronic problem. Compliance problems include diet (takes meds every day, diet not so good and getting better with exericse now that weather is better).    chroni sinus irritation and the nose blleeed-cont to see nose bleed ent docs  Pt now on anora, just using samples  Used it a few days ast week and it seemed to irritate  The sininuses   Pt noticed improvement in the past with the symbicort    Pt had vocal cord irritaiton in th past with the   symbicort   Pt want s to try to exercise  Blood pressure medicine and blood pressure levels reviewed today with patient. Compliant with blood pressure medicine. States does not miss a dose. No obvious side effects. Blood pressure generally good when checked elsewhere. Watching salt intake.   Patient continues to take lipid medication regularly. No obvious side effects from it. Generally does not miss a dose. Prior blood work results are reviewed with patient. Patient continues to work on fat intake in diet  Patient compliant with insomnia medication. Generally takes most nights. No obvious morning drowsiness. Definitely helps patient sleep. Without it patient states would not get a good nights rest.     Review of Systems No headache no chest pain.  Positive chronic dyspnea with exertion secondary to COPD    Objective:   Physical Exam  Alert active.  Good hydration.  HEENT normal heart blood pressure excellent on repeat.  Lungs diminished diffuse breath sounds.  No wheezes no crackles no tachypnea.  Heart regular rate and rhythm.  Ankles without edema      Assessment & Plan:  Impression 1 hyperlipidemia.  Await blood work.  Discussed compliance discussed diet discussed  2.  Hypertension.  Good control discussed maintain same meds  3 insomnia.   Ongoing  4.  COPD fairly substantial these days.  Now on her pulmonary doctors management.  5.  Coronary atherosclerosis/aortic aneurysm/PACs followed by cardiologist  6.  Mild elevation of sugar await results  Blood work ordered.  Diet exercise discussed.  Medications refilled.  Follow-up in 6 months

## 2019-08-14 ENCOUNTER — Telehealth: Payer: Self-pay | Admitting: Pulmonary Disease

## 2019-08-14 NOTE — Telephone Encounter (Signed)
Spoke with pt, he thinks he is having a reaction to Anoro because since he has started taking it he has had dizziness and sinus congestion. He does have glaucoma but states the symptoms started after taking the Anoro. He would like to know if he should switch inhalers. AO please advise.

## 2019-08-14 NOTE — Telephone Encounter (Signed)
Patient is returning phone call. Patient phone number is [phone].

## 2019-08-14 NOTE — Telephone Encounter (Signed)
lmtcb for pt.  

## 2019-08-14 NOTE — Telephone Encounter (Signed)
He should discontinue the Anoro  Go back to using Flovent

## 2019-08-15 DIAGNOSIS — R040 Epistaxis: Secondary | ICD-10-CM | POA: Diagnosis not present

## 2019-08-15 MED ORDER — FLOVENT HFA 220 MCG/ACT IN AERO: 2.0000 | INHALATION_SPRAY | Freq: Two times a day (BID) | RESPIRATORY_TRACT | 12 refills | Status: DC

## 2019-08-15 NOTE — Telephone Encounter (Signed)
Spoke with pt. He is aware of Dr. Judson Roch recommendation. Rx has been sent in for Flovent as pt's current inhaler had expired. Nothing further was needed.

## 2019-08-16 DIAGNOSIS — R040 Epistaxis: Secondary | ICD-10-CM | POA: Diagnosis not present

## 2019-09-03 ENCOUNTER — Other Ambulatory Visit: Payer: Self-pay | Admitting: Family Medicine

## 2019-09-03 DIAGNOSIS — E785 Hyperlipidemia, unspecified: Secondary | ICD-10-CM | POA: Diagnosis not present

## 2019-09-03 DIAGNOSIS — N182 Chronic kidney disease, stage 2 (mild): Secondary | ICD-10-CM | POA: Diagnosis not present

## 2019-09-03 DIAGNOSIS — I129 Hypertensive chronic kidney disease with stage 1 through stage 4 chronic kidney disease, or unspecified chronic kidney disease: Secondary | ICD-10-CM | POA: Diagnosis not present

## 2019-09-03 DIAGNOSIS — J449 Chronic obstructive pulmonary disease, unspecified: Secondary | ICD-10-CM | POA: Diagnosis not present

## 2019-09-03 DIAGNOSIS — I2510 Atherosclerotic heart disease of native coronary artery without angina pectoris: Secondary | ICD-10-CM | POA: Diagnosis not present

## 2019-09-03 DIAGNOSIS — R809 Proteinuria, unspecified: Secondary | ICD-10-CM | POA: Diagnosis not present

## 2019-09-05 DIAGNOSIS — Z131 Encounter for screening for diabetes mellitus: Secondary | ICD-10-CM | POA: Diagnosis not present

## 2019-09-05 DIAGNOSIS — Z79899 Other long term (current) drug therapy: Secondary | ICD-10-CM | POA: Diagnosis not present

## 2019-09-05 DIAGNOSIS — R809 Proteinuria, unspecified: Secondary | ICD-10-CM | POA: Diagnosis not present

## 2019-09-05 DIAGNOSIS — E785 Hyperlipidemia, unspecified: Secondary | ICD-10-CM | POA: Diagnosis not present

## 2019-09-06 LAB — LIPID PANEL
Chol/HDL Ratio: 2.7 ratio (ref 0.0–5.0)
Cholesterol, Total: 124 mg/dL (ref 100–199)
HDL: 46 mg/dL (ref >39)
LDL Chol Calc (NIH): 63 mg/dL (ref 0–99)
Triglycerides: 71 mg/dL (ref 0–149)
VLDL Cholesterol Cal: 15 mg/dL (ref 5–40)

## 2019-09-06 LAB — HEPATIC FUNCTION PANEL
ALT: 21 IU/L (ref 0–44)
AST: 27 IU/L (ref 0–40)
Albumin: 4.6 g/dL (ref 3.7–4.7)
Alkaline Phosphatase: 77 IU/L (ref 39–117)
Bilirubin Total: 0.6 mg/dL (ref 0.0–1.2)
Bilirubin, Direct: 0.18 mg/dL (ref 0.00–0.40)
Total Protein: 7.3 g/dL (ref 6.0–8.5)

## 2019-09-06 LAB — GLUCOSE, RANDOM: Glucose: 107 mg/dL — ABNORMAL HIGH (ref 65–99)

## 2019-09-08 ENCOUNTER — Encounter: Payer: Self-pay | Admitting: Family Medicine

## 2019-09-19 DIAGNOSIS — R040 Epistaxis: Secondary | ICD-10-CM | POA: Diagnosis not present

## 2019-09-25 DIAGNOSIS — H3581 Retinal edema: Secondary | ICD-10-CM | POA: Diagnosis not present

## 2019-09-25 DIAGNOSIS — H401122 Primary open-angle glaucoma, left eye, moderate stage: Secondary | ICD-10-CM | POA: Diagnosis not present

## 2019-09-25 DIAGNOSIS — H0102B Squamous blepharitis left eye, upper and lower eyelids: Secondary | ICD-10-CM | POA: Diagnosis not present

## 2019-09-25 DIAGNOSIS — H04123 Dry eye syndrome of bilateral lacrimal glands: Secondary | ICD-10-CM | POA: Diagnosis not present

## 2019-09-25 DIAGNOSIS — H401111 Primary open-angle glaucoma, right eye, mild stage: Secondary | ICD-10-CM | POA: Diagnosis not present

## 2019-09-25 DIAGNOSIS — Z961 Presence of intraocular lens: Secondary | ICD-10-CM | POA: Diagnosis not present

## 2019-09-25 DIAGNOSIS — H0102A Squamous blepharitis right eye, upper and lower eyelids: Secondary | ICD-10-CM | POA: Diagnosis not present

## 2019-09-25 DIAGNOSIS — Z9889 Other specified postprocedural states: Secondary | ICD-10-CM | POA: Diagnosis not present

## 2019-09-26 DIAGNOSIS — H43811 Vitreous degeneration, right eye: Secondary | ICD-10-CM | POA: Diagnosis not present

## 2019-09-26 DIAGNOSIS — H35372 Puckering of macula, left eye: Secondary | ICD-10-CM | POA: Diagnosis not present

## 2019-09-26 DIAGNOSIS — H35352 Cystoid macular degeneration, left eye: Secondary | ICD-10-CM | POA: Diagnosis not present

## 2019-10-11 ENCOUNTER — Other Ambulatory Visit: Payer: Self-pay | Admitting: Family Medicine

## 2019-10-11 NOTE — Telephone Encounter (Signed)
Med check up 08/13/19

## 2019-10-17 DIAGNOSIS — H35372 Puckering of macula, left eye: Secondary | ICD-10-CM | POA: Diagnosis not present

## 2019-10-17 DIAGNOSIS — H43813 Vitreous degeneration, bilateral: Secondary | ICD-10-CM | POA: Diagnosis not present

## 2019-10-18 ENCOUNTER — Telehealth: Payer: Self-pay | Admitting: Family Medicine

## 2019-10-18 NOTE — Telephone Encounter (Signed)
Pt asked for an appt tomorrow with Dr. Lovena Le due to having thrush. Dr. Lovena Le is out of the office tomorrow and Dr. Nicki Reaper and Hoyle Sauer are booked.   Pt would like to know if something could be called in for thrush he has white spots on his tongue.   [name] [name].

## 2019-10-18 NOTE — Telephone Encounter (Signed)
Patient states this is day 2. He has white spots on his tongue that he referred to as cottage cheese. Throat and tongue slightly irritated and a little hoarseness. He has used biotin mouthwash and gargling with warm water and apple cider vinegar. States he had to use his ventolin inhaler some over the last week and this happens every once in a while.

## 2019-10-18 NOTE — Telephone Encounter (Signed)
Patient notified and verbalized understanding. 

## 2019-10-18 NOTE — Telephone Encounter (Signed)
Pt needs appt, if he can wait till Monday, then put him on schedule, otherwise urgent care. Thx.  Dr. Lovena Le

## 2019-10-19 DIAGNOSIS — K1329 Other disturbances of oral epithelium, including tongue: Secondary | ICD-10-CM | POA: Diagnosis not present

## 2019-10-19 DIAGNOSIS — B370 Candidal stomatitis: Secondary | ICD-10-CM | POA: Diagnosis not present

## 2019-10-22 NOTE — Progress Notes (Signed)
Patient ID: [name] [name], male   DOB: [birth year], 79 y.o.   MRN: [id number]     [name] [name] is seen today in followup for his coronary artery disease. 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 Olevia Perches I believe back in 1999. His last Myoview in July of 2009 was nonischemic. He has good LV function. His been compliant with his meds. He has a 4.1cm AAA by [place] 02/14/19   He has not had any abdominal pain. He visits his daughter in [place] regularly Does not have his dogs anymore but still walks. . Also has had some palpitations and monitor showing PAC;s short bursts PSVT  Indicates intolerance to beta blockers toprol and bystolic .  Interestingly diarrhea occurred with both Has had issues with coreg, atenolol, cardizem and ranexa in past. Some nasal Congestion with ARB in past as well   Wife passed a couple of years ago of Pancreatic Cancer and only lasted 6 weeks form diagnosis   No cardiac complaints needs new nitro   ROS: Denies fever, malais, weight loss, blurry vision, decreased visual acuity, cough, sputum, SOB, hemoptysis, pleuritic pain, palpitaitons, heartburn, abdominal pain, melena, lower extremity edema, claudication, or rash.  All other systems reviewed and negative  General: BP 115/71   Pulse 69   Temp (!) 97.4 F (36.3 C)   Ht 6\' 1"  (1.854 m)   Wt 200 lb 3.2 oz (90.8 kg)   SpO2 96%   BMI 26.41 kg/m   Affect appropriate Healthy:  appears stated age 45: hard of hearing Aides in place  Neck supple with no adenopathy JVP normal no bruits no thyromegaly Lungs Mild exp wheezing and good diaphragmatic motion Heart:  S1/S2 no murmur, no rub, gallop or click PMI normal Abdomen: benighn, BS positve, no tenderness, AAA palpable not tender  no bruit.  No HSM or HJR Distal pulses intact with no bruits No edema Neuro non-focal Skin warm and dry No muscular weakness   Current Outpatient Medications  Medication Sig Dispense Refill  . albuterol (PROVENTIL)  (2.5 MG/3ML) 0.083% nebulizer solution INHALE 1 VIAL VIA NEBULIZER FOUR TIMES DAILY 375 mL 6  . cetirizine (ZYRTEC) 10 MG tablet Take 10 mg by mouth daily.    . dorzolamide-timolol (COSOPT) 22.3-6.8 MG/ML ophthalmic solution Place 1 drop into the left eye 2 (two) times daily.    Marland Kitchen esomeprazole (NEXIUM) 20 MG capsule Take 20 mg by mouth daily at 12 noon.    Marland Kitchen LORazepam (ATIVAN) 1 MG tablet Take 0.5 tablets (0.5 mg total) by mouth at bedtime. 15 tablet 5  . losartan (COZAAR) 25 MG tablet Take 1 tablet (25 mg total) by mouth daily. 90 tablet 1  . lovastatin (MEVACOR) 40 MG tablet Take 1 tablet (40 mg total) by mouth daily. 30 tablet 5  . Multiple Vitamin (MULTIVITAMIN) tablet Take 1 tablet by mouth daily.    . nitroGLYCERIN (NITROSTAT) 0.4 MG SL tablet Place 1 tablet (0.4 mg total) under the tongue every 5 (five) minutes as needed for chest pain. 25 tablet 5  . nystatin (MYCOSTATIN) 100000 UNIT/ML suspension Take 5 mLs by mouth every 6 (six) hours.    Marland Kitchen PROAIR HFA 108 (90 Base) MCG/ACT inhaler INHALE 2 PUFFS INTO THE LUNGS EVERY SIX HOURS AS NEEDED FOR WHEEZING. 8.5 g 2  . PROLENSA 0.07 % SOLN Place 1 drop into the left eye daily.     No current facility-administered medications for this visit.    Allergies  Lasix [  furosemide], Cefzil [cefprozil], Dexamethasone, Gabapentin, Neomycin, Tetracyclines & related, Ciprofloxacin, Methocarbamol, and Penicillins  Electrocardiogram:  04/07/18  SR PAC;s otherwise normal   Assessment and Plan  CAD:  Distant PCI RCA 1999 with normal myovue 2009  conitnue medical Rx intolerant to beta blocker  Low risk myovue 08/31/16 with mild mid to apical inferior defect partially reversible EF 56% continue medical Rx  AAA:  4.0 cm had CT 06/30/17  Stable 4.1 cm by [place] 02/14/19  HTN:  Improved but have to hold ARB for now   GERD:  Continue carafate and nexium f/u GI  Asthma:  On inhalers no active wheezing   PAC/SVT:  Stable unable to take beta  blockers  Anxiety/Depression:   Has not started on SSRI   ENT:  ARB held hearing worse using Aides   HLD:  On mevacor 40 mg daily LDL 67 at goal    F/U with me 6 months    Jenkins Rouge

## 2019-10-24 ENCOUNTER — Other Ambulatory Visit: Payer: Self-pay

## 2019-10-24 ENCOUNTER — Ambulatory Visit (INDEPENDENT_AMBULATORY_CARE_PROVIDER_SITE_OTHER): Payer: Medicare Other | Admitting: Family Medicine

## 2019-10-24 ENCOUNTER — Encounter: Payer: Self-pay | Admitting: Family Medicine

## 2019-10-24 VITALS — BP 132/70 | HR 100 | Temp 97.8°F | Ht 73.0 in | Wt 199.8 lb

## 2019-10-24 DIAGNOSIS — R490 Dysphonia: Secondary | ICD-10-CM | POA: Diagnosis not present

## 2019-10-24 DIAGNOSIS — R7301 Impaired fasting glucose: Secondary | ICD-10-CM | POA: Diagnosis not present

## 2019-10-24 DIAGNOSIS — I10 Essential (primary) hypertension: Secondary | ICD-10-CM

## 2019-10-24 DIAGNOSIS — B370 Candidal stomatitis: Secondary | ICD-10-CM

## 2019-10-24 NOTE — Progress Notes (Signed)
Patient ID: [name] [name], male    DOB: [birth year], 79 y.o.   MRN: [id number]   Chief Complaint  Patient presents with  . Thrush   Subjective:    HPI  pt went to urgent care last week for thrush. Prescribed nystatin and not much better.  Using flovent for moderate copd.  Pt stating still getting 'thrush" at times.  Went to urgent care last week. Gave him nystatin.  Slightly better. Burning at times.  Feels hoarseness when getting this.  Documented in chart since 2013/08/01 getting thrush.  Pt thinking related to his inhalers. Stating he is drinking and clearing mouth after his inhaler use. 6 days of nystatin. Taking it 4x per day.   H/o insomnia- taking 1/2 tab ativan 54m- prn sleep 30 tab with 5 refills.  Has been on this since 2Mar 18, 2019  Wife passed away 2 yrs ago.  H/o 2 retina surgeries on left eye. Taking eye drops. Dx recently- swelling behind the retina. Seeing Dr. SBaird Cancer   Having hoarseness last few weeks, and changed to flovent and wondered if that caused it.  Not feeling sick or fever.  No sore throat. Stopped symbicort and then 1 wk ago went back to flovent for past week.   Medical History EBurnishas a past medical history of AAA [place]) ([place], Allergy, Anemia, Arthritis, Asthma, BPH (benign prostatic hyperplasia), CAD (coronary artery disease), Cancer ([place], COPD (chronic obstructive pulmonary disease) ([place], Dysrhythmia, GERD (gastroesophageal reflux disease), Glaucoma, HOH (hard of hearing), Hypercholesterolemia, Hypertension, Impaired fasting glucose, Low back pain, MI ([place]ocardial infarction) ([place] ([place]), and Thrush.   Outpatient Encounter Medications as of 10/24/2019  Medication Sig  . albuterol (PROVENTIL) (2.5 MG/3ML) 0.083% nebulizer solution INHALE 1 VIAL VIA NEBULIZER FOUR TIMES DAILY  . cetirizine (ZYRTEC) 10 MG tablet Take 10 mg by mouth daily.  . dorzolamide-timolol (COSOPT) 22.3-6.8 MG/ML ophthalmic solution Place 1 drop into the left eye 2  (two) times daily.  .Marland Kitchenesomeprazole (NEXIUM) 20 MG capsule Take 20 mg by mouth daily at 12 noon.  .Marland KitchenLORazepam (ATIVAN) 1 MG tablet Take 0.5 tablets (0.5 mg total) by mouth at bedtime.  .Marland Kitchenlosartan (COZAAR) 25 MG tablet Take 1 tablet (25 mg total) by mouth daily.  .Marland Kitchenlovastatin (MEVACOR) 40 MG tablet Take 1 tablet (40 mg total) by mouth daily.  . Multiple Vitamin (MULTIVITAMIN) tablet Take 1 tablet by mouth daily.  . nitroGLYCERIN (NITROSTAT) 0.4 MG SL tablet Place 1 tablet (0.4 mg total) under the tongue every 5 (five) minutes as needed for chest pain.  .Marland Kitchennystatin (MYCOSTATIN) 100000 UNIT/ML suspension Take 5 mLs by mouth every 6 (six) hours.  .Marland KitchenPROAIR HFA 108 (90 Base) MCG/ACT inhaler INHALE 2 PUFFS INTO THE LUNGS EVERY SIX HOURS AS NEEDED FOR WHEEZING.  . PROLENSA 0.07 % SOLN Place 1 drop into the left eye daily.  . [DISCONTINUED] fluticasone (FLOVENT HFA) 220 MCG/ACT inhaler Inhale 2 puffs into the lungs in the morning and at bedtime.  . [DISCONTINUED] latanoprost (XALATAN) 0.005 % ophthalmic solution   . [DISCONTINUED] tamsulosin (FLOMAX) 0.4 MG CAPS capsule TAKE 1 CAPSULE BY MOUTH AT BEDTIME.   No facility-administered encounter medications on file as of 10/24/2019.     Review of Systems  Constitutional: Negative for chills and fever.  HENT: Positive for voice change. Negative for congestion, rhinorrhea and sore throat.        +Spots on tongue.  Skin: Negative for rash.    Vitals BP 132/70   Pulse  100   Temp 97.8 F (36.6 C)   Ht _0  (1.854 m)   Wt 199 lb 12.8 oz (90.6 kg)   SpO2 99%   BMI 26.36 kg/m   Objective:   Physical Exam Vitals and nursing note reviewed.  Constitutional:      General: He is not in acute distress.    Appearance: Normal appearance.  HENT:     Head: Normocephalic and atraumatic.     Nose: Nose normal. No congestion or rhinorrhea.     Mouth/Throat:     Mouth: Mucous membranes are moist.     Pharynx: No oropharyngeal exudate or posterior  oropharyngeal erythema.     Comments: +geographic tongue and small patch of yellowish plaque on posterior tongue. Pulmonary:     Effort: No respiratory distress.  Neurological:     General: No focal deficit present.     Mental Status: He is alert and oriented to person, place, and time.  Psychiatric:        Mood and Affect: Mood normal.        Behavior: Behavior normal.      Assessment and Plan   1. Thrush, oral  2. Hoarseness of voice  3. Essential hypertension - CBC - CMP14+EGFR - Lipid panel  4. Impaired fasting blood sugar - CMP14+EGFR - Hemoglobin A1c    Pt wanting to Stop flovent and pt wanting to go back to symbicort.  Not needing refill at this time. Pt was given symbicort by Dr. Nicki Reaper in 1/21.  Pt doing well with insomnia with 1/2 tablet of ativan 42m qhs. Only getting 15 tabs pre month.  Finish one more week of nystatin to see if it clears up.  If not or still having hoarseness then f/u ENT for check of mouth and vocal cords.  [place] to [place] if not able to get into see ENT soon.    F/u 3 months for recheck and labs.

## 2019-10-26 ENCOUNTER — Encounter: Payer: Self-pay | Admitting: Cardiovascular Disease

## 2019-10-26 ENCOUNTER — Ambulatory Visit (INDEPENDENT_AMBULATORY_CARE_PROVIDER_SITE_OTHER): Payer: Medicare Other | Admitting: Cardiovascular Disease

## 2019-10-26 ENCOUNTER — Other Ambulatory Visit: Payer: Self-pay

## 2019-10-26 VITALS — BP 115/71 | HR 69 | Temp 97.4°F | Ht 73.0 in | Wt 200.2 lb

## 2019-10-26 DIAGNOSIS — I2510 Atherosclerotic heart disease of native coronary artery without angina pectoris: Secondary | ICD-10-CM

## 2019-10-26 DIAGNOSIS — I714 Abdominal aortic aneurysm, without rupture, unspecified: Secondary | ICD-10-CM

## 2019-10-26 NOTE — Patient Instructions (Signed)
Medication Instructions:  Your physician recommends that you continue on your current medications as directed. Please refer to the Current Medication list given to you today.  *If you need a refill on your cardiac medications before your next appointment, please call your pharmacy*   Lab Work: NONE   If you have labs (blood work) drawn today and your tests are completely normal, you will receive your results only by: Marland Kitchen MyChart Message (if you have MyChart) OR . A paper copy in the mail If you have any lab test that is abnormal or we need to change your treatment, we will call you to review the results.   Testing/Procedures: Your physician has requested that you have an abdominal aorta duplex. During this test, an ultrasound is used to evaluate the aorta. Allow 30 minutes for this exam. Do not eat after midnight the day before and avoid carbonated beverages   Follow-Up: At [facility], you and your health needs are our priority.  As part of our continuing mission to provide you with exceptional heart care, we have created designated Provider Care Teams.  These Care Teams include your primary Cardiologist (physician) and Advanced Practice Providers (APPs -  Physician Assistants and Nurse Practitioners) who all work together to provide you with the care you need, when you need it.  We recommend signing up for the patient portal called "MyChart".  Sign up information is provided on this After Visit Summary.  MyChart is used to connect with patients for Virtual Visits (Telemedicine).  Patients are able to view lab/test results, encounter notes, upcoming appointments, etc.  Non-urgent messages can be sent to your provider as well.   To learn more about what you can do with MyChart, go to [email or web address].    Your next appointment:   6 month(s)  The format for your next appointment:   In Person  Provider:   Jenkins Rouge, MD   Other Instructions Thank you for choosing [facility]!

## 2019-10-26 NOTE — Addendum Note (Signed)
Addended by: Levonne Hubert on: 10/26/2019 11:46 AM   Modules accepted: Orders

## 2019-10-31 ENCOUNTER — Ambulatory Visit (HOSPITAL_COMMUNITY): Payer: Medicare Other

## 2019-11-01 ENCOUNTER — Other Ambulatory Visit (HOSPITAL_COMMUNITY)
Admission: RE | Admit: 2019-11-01 | Discharge: 2019-11-01 | Disposition: A | Discharge status: Home / Self Care | Payer: Medicare Other | Source: Ambulatory Visit | Attending: Pulmonary Disease | Admitting: Pulmonary Disease

## 2019-11-01 ENCOUNTER — Other Ambulatory Visit: Payer: Self-pay

## 2019-11-01 DIAGNOSIS — Z20822 Contact with and (suspected) exposure to covid-19: Secondary | ICD-10-CM | POA: Insufficient documentation

## 2019-11-01 DIAGNOSIS — Z01812 Encounter for preprocedural laboratory examination: Secondary | ICD-10-CM | POA: Diagnosis not present

## 2019-11-01 LAB — SARS CORONAVIRUS 2 (TAT 6-24 HRS): SARS Coronavirus 2: NEGATIVE

## 2019-11-05 ENCOUNTER — Ambulatory Visit (INDEPENDENT_AMBULATORY_CARE_PROVIDER_SITE_OTHER): Payer: Medicare Other | Admitting: Pulmonary Disease

## 2019-11-05 ENCOUNTER — Other Ambulatory Visit: Payer: Self-pay

## 2019-11-05 DIAGNOSIS — J410 Simple chronic bronchitis: Secondary | ICD-10-CM | POA: Diagnosis not present

## 2019-11-05 LAB — PULMONARY FUNCTION TEST
DL/VA % pred: 136 %
DL/VA: 5.25 ml/min/mmHg/L
DLCO cor % pred: 114 %
DLCO cor: 32.71 ml/min/mmHg
DLCO unc % pred: 114 %
DLCO unc: 32.71 ml/min/mmHg
FEF 25-75 Post: 0.83 L/sec
FEF 25-75 Pre: 0.66 L/sec
FEF2575-%Change-Post: 25 %
FEF2575-%Pred-Post: 32 %
FEF2575-%Pred-Pre: 25 %
FEV1-%Change-Post: 8 %
FEV1-%Pred-Post: 47 %
FEV1-%Pred-Pre: 43 %
FEV1-Post: 1.72 L
FEV1-Pre: 1.58 L
FEV1FVC-%Change-Post: 8 %
FEV1FVC-%Pred-Pre: 78 %
FEV6-%Change-Post: 4 %
FEV6-%Pred-Post: 59 %
FEV6-%Pred-Pre: 56 %
FEV6-Post: 2.77 L
FEV6-Pre: 2.65 L
FEV6FVC-%Change-Post: 3 %
FEV6FVC-%Pred-Post: 104 %
FEV6FVC-%Pred-Pre: 100 %
FVC-%Change-Post: 0 %
FVC-%Pred-Post: 56 %
FVC-%Pred-Pre: 56 %
FVC-Post: 2.82 L
FVC-Pre: 2.8 L
Post FEV1/FVC ratio: 61 %
Post FEV6/FVC ratio: 98 %
Pre FEV1/FVC ratio: 56 %
Pre FEV6/FVC Ratio: 95 %

## 2019-11-05 NOTE — Progress Notes (Signed)
Full PFT performed today. N2 done in place of [facility].

## 2019-11-10 ENCOUNTER — Encounter (HOSPITAL_COMMUNITY): Payer: Self-pay | Admitting: Emergency Medicine

## 2019-11-10 ENCOUNTER — Other Ambulatory Visit: Payer: Self-pay

## 2019-11-10 ENCOUNTER — Inpatient Hospital Stay (HOSPITAL_COMMUNITY)
Admission: EM | Admit: 2019-11-10 | Discharge: 2019-11-16 | DRG: 440 | Disposition: A | Discharge status: Home / Self Care | Payer: Medicare Other | Attending: Internal Medicine | Admitting: Internal Medicine

## 2019-11-10 DIAGNOSIS — K859 Acute pancreatitis without necrosis or infection, unspecified: Secondary | ICD-10-CM | POA: Diagnosis present

## 2019-11-10 DIAGNOSIS — Z85828 Personal history of other malignant neoplasm of skin: Secondary | ICD-10-CM

## 2019-11-10 DIAGNOSIS — Z87440 Personal history of urinary (tract) infections: Secondary | ICD-10-CM

## 2019-11-10 DIAGNOSIS — N400 Enlarged prostate without lower urinary tract symptoms: Secondary | ICD-10-CM | POA: Diagnosis present

## 2019-11-10 DIAGNOSIS — R1013 Epigastric pain: Secondary | ICD-10-CM

## 2019-11-10 DIAGNOSIS — Z888 Allergy status to other drugs, medicaments and biological substances status: Secondary | ICD-10-CM

## 2019-11-10 DIAGNOSIS — I252 Old myocardial infarction: Secondary | ICD-10-CM

## 2019-11-10 DIAGNOSIS — Z87891 Personal history of nicotine dependence: Secondary | ICD-10-CM

## 2019-11-10 DIAGNOSIS — I714 Abdominal aortic aneurysm, without rupture: Secondary | ICD-10-CM | POA: Diagnosis present

## 2019-11-10 DIAGNOSIS — Z825 Family history of asthma and other chronic lower respiratory diseases: Secondary | ICD-10-CM

## 2019-11-10 DIAGNOSIS — J441 Chronic obstructive pulmonary disease with (acute) exacerbation: Secondary | ICD-10-CM | POA: Diagnosis present

## 2019-11-10 DIAGNOSIS — I10 Essential (primary) hypertension: Secondary | ICD-10-CM | POA: Diagnosis not present

## 2019-11-10 DIAGNOSIS — K219 Gastro-esophageal reflux disease without esophagitis: Secondary | ICD-10-CM | POA: Diagnosis present

## 2019-11-10 DIAGNOSIS — Z20822 Contact with and (suspected) exposure to covid-19: Secondary | ICD-10-CM | POA: Diagnosis not present

## 2019-11-10 DIAGNOSIS — Z8249 Family history of ischemic heart disease and other diseases of the circulatory system: Secondary | ICD-10-CM

## 2019-11-10 DIAGNOSIS — I2510 Atherosclerotic heart disease of native coronary artery without angina pectoris: Secondary | ICD-10-CM | POA: Diagnosis present

## 2019-11-10 DIAGNOSIS — J449 Chronic obstructive pulmonary disease, unspecified: Secondary | ICD-10-CM | POA: Diagnosis present

## 2019-11-10 DIAGNOSIS — Z8701 Personal history of pneumonia (recurrent): Secondary | ICD-10-CM

## 2019-11-10 DIAGNOSIS — Z880 Allergy status to penicillin: Secondary | ICD-10-CM

## 2019-11-10 DIAGNOSIS — K8500 Idiopathic acute pancreatitis without necrosis or infection: Principal | ICD-10-CM | POA: Diagnosis present

## 2019-11-10 DIAGNOSIS — Z981 Arthrodesis status: Secondary | ICD-10-CM

## 2019-11-10 DIAGNOSIS — H409 Unspecified glaucoma: Secondary | ICD-10-CM | POA: Diagnosis present

## 2019-11-10 DIAGNOSIS — Z881 Allergy status to other antibiotic agents status: Secondary | ICD-10-CM

## 2019-11-10 DIAGNOSIS — D72829 Elevated white blood cell count, unspecified: Secondary | ICD-10-CM

## 2019-11-10 DIAGNOSIS — I219 Acute myocardial infarction, unspecified: Secondary | ICD-10-CM | POA: Diagnosis present

## 2019-11-10 DIAGNOSIS — H9190 Unspecified hearing loss, unspecified ear: Secondary | ICD-10-CM

## 2019-11-10 DIAGNOSIS — Z79899 Other long term (current) drug therapy: Secondary | ICD-10-CM

## 2019-11-10 DIAGNOSIS — G4700 Insomnia, unspecified: Secondary | ICD-10-CM | POA: Diagnosis present

## 2019-11-10 LAB — BASIC METABOLIC PANEL
Anion gap: 9 (ref 5–15)
BUN: 14 mg/dL (ref 8–23)
CO2: 29 mmol/L (ref 22–32)
Calcium: 9.6 mg/dL (ref 8.9–10.3)
Chloride: 102 mmol/L (ref 98–111)
Creatinine, Ser: 1.08 mg/dL (ref 0.61–1.24)
GFR calc Af Amer: >60 mL/min (ref >60)
GFR calc non Af Amer: >60 mL/min (ref >60)
Glucose, Bld: 155 mg/dL — ABNORMAL HIGH (ref 70–99)
Potassium: 4.2 mmol/L (ref 3.5–5.1)
Sodium: 140 mmol/L (ref 135–145)

## 2019-11-10 LAB — CBC
HCT: 48.7 % (ref 39.0–52.0)
Hemoglobin: 15.3 g/dL (ref 13.0–17.0)
MCH: 28 pg (ref 26.0–34.0)
MCHC: 31.4 g/dL (ref 30.0–36.0)
MCV: 89 fL (ref 80.0–100.0)
Platelets: 248 10*3/uL (ref 150–400)
RBC: 5.47 MIL/uL (ref 4.22–5.81)
RDW: 13.5 % (ref 11.5–15.5)
WBC: 15.4 10*3/uL — ABNORMAL HIGH (ref 4.0–10.5)
nRBC: 0 % (ref 0.0–0.2)

## 2019-11-10 LAB — LIPASE, BLOOD: Lipase: 5229 U/L — ABNORMAL HIGH (ref 11–51)

## 2019-11-10 LAB — TROPONIN I (HIGH SENSITIVITY): Troponin I (High Sensitivity): 4 ng/L (ref <18)

## 2019-11-10 NOTE — ED Triage Notes (Signed)
Patient states diarrhea over the last few days and now is complaining of epigastric pain. Patient also states nausea today after he ate a sandwich. Patient states that it hurts to breath.

## 2019-11-11 ENCOUNTER — Encounter (HOSPITAL_COMMUNITY): Payer: Self-pay | Admitting: Family Medicine

## 2019-11-11 ENCOUNTER — Emergency Department (HOSPITAL_COMMUNITY): Payer: Medicare Other

## 2019-11-11 ENCOUNTER — Other Ambulatory Visit: Payer: Self-pay

## 2019-11-11 DIAGNOSIS — I252 Old myocardial infarction: Secondary | ICD-10-CM | POA: Diagnosis not present

## 2019-11-11 DIAGNOSIS — Z85828 Personal history of other malignant neoplasm of skin: Secondary | ICD-10-CM | POA: Diagnosis not present

## 2019-11-11 DIAGNOSIS — Z825 Family history of asthma and other chronic lower respiratory diseases: Secondary | ICD-10-CM | POA: Diagnosis not present

## 2019-11-11 DIAGNOSIS — H9190 Unspecified hearing loss, unspecified ear: Secondary | ICD-10-CM

## 2019-11-11 DIAGNOSIS — Z79899 Other long term (current) drug therapy: Secondary | ICD-10-CM | POA: Diagnosis not present

## 2019-11-11 DIAGNOSIS — Z8701 Personal history of pneumonia (recurrent): Secondary | ICD-10-CM | POA: Diagnosis not present

## 2019-11-11 DIAGNOSIS — I714 Abdominal aortic aneurysm, without rupture: Secondary | ICD-10-CM | POA: Diagnosis not present

## 2019-11-11 DIAGNOSIS — I10 Essential (primary) hypertension: Secondary | ICD-10-CM | POA: Diagnosis not present

## 2019-11-11 DIAGNOSIS — K859 Acute pancreatitis without necrosis or infection, unspecified: Secondary | ICD-10-CM | POA: Diagnosis not present

## 2019-11-11 DIAGNOSIS — K8500 Idiopathic acute pancreatitis without necrosis or infection: Secondary | ICD-10-CM | POA: Diagnosis not present

## 2019-11-11 DIAGNOSIS — Z881 Allergy status to other antibiotic agents status: Secondary | ICD-10-CM | POA: Diagnosis not present

## 2019-11-11 DIAGNOSIS — Z87440 Personal history of urinary (tract) infections: Secondary | ICD-10-CM | POA: Diagnosis not present

## 2019-11-11 DIAGNOSIS — Z888 Allergy status to other drugs, medicaments and biological substances status: Secondary | ICD-10-CM | POA: Diagnosis not present

## 2019-11-11 DIAGNOSIS — J449 Chronic obstructive pulmonary disease, unspecified: Secondary | ICD-10-CM | POA: Diagnosis not present

## 2019-11-11 DIAGNOSIS — K838 Other specified diseases of biliary tract: Secondary | ICD-10-CM | POA: Diagnosis not present

## 2019-11-11 DIAGNOSIS — H409 Unspecified glaucoma: Secondary | ICD-10-CM | POA: Diagnosis not present

## 2019-11-11 DIAGNOSIS — Z8249 Family history of ischemic heart disease and other diseases of the circulatory system: Secondary | ICD-10-CM | POA: Diagnosis not present

## 2019-11-11 DIAGNOSIS — K219 Gastro-esophageal reflux disease without esophagitis: Secondary | ICD-10-CM | POA: Diagnosis not present

## 2019-11-11 DIAGNOSIS — J9811 Atelectasis: Secondary | ICD-10-CM | POA: Diagnosis not present

## 2019-11-11 DIAGNOSIS — I2510 Atherosclerotic heart disease of native coronary artery without angina pectoris: Secondary | ICD-10-CM | POA: Diagnosis not present

## 2019-11-11 DIAGNOSIS — N400 Enlarged prostate without lower urinary tract symptoms: Secondary | ICD-10-CM | POA: Diagnosis not present

## 2019-11-11 DIAGNOSIS — K5730 Diverticulosis of large intestine without perforation or abscess without bleeding: Secondary | ICD-10-CM | POA: Diagnosis not present

## 2019-11-11 DIAGNOSIS — K8689 Other specified diseases of pancreas: Secondary | ICD-10-CM | POA: Diagnosis not present

## 2019-11-11 DIAGNOSIS — J410 Simple chronic bronchitis: Secondary | ICD-10-CM | POA: Diagnosis not present

## 2019-11-11 DIAGNOSIS — Z20822 Contact with and (suspected) exposure to covid-19: Secondary | ICD-10-CM | POA: Diagnosis not present

## 2019-11-11 DIAGNOSIS — Z981 Arthrodesis status: Secondary | ICD-10-CM | POA: Diagnosis not present

## 2019-11-11 DIAGNOSIS — Z880 Allergy status to penicillin: Secondary | ICD-10-CM | POA: Diagnosis not present

## 2019-11-11 DIAGNOSIS — D72829 Elevated white blood cell count, unspecified: Secondary | ICD-10-CM | POA: Diagnosis not present

## 2019-11-11 DIAGNOSIS — Z87891 Personal history of nicotine dependence: Secondary | ICD-10-CM | POA: Diagnosis not present

## 2019-11-11 DIAGNOSIS — G4700 Insomnia, unspecified: Secondary | ICD-10-CM | POA: Diagnosis not present

## 2019-11-11 LAB — HEPATIC FUNCTION PANEL
ALT: 20 U/L (ref 0–44)
AST: 25 U/L (ref 15–41)
Albumin: 4.3 g/dL (ref 3.5–5.0)
Alkaline Phosphatase: 67 U/L (ref 38–126)
Bilirubin, Direct: 0.1 mg/dL (ref 0.0–0.2)
Indirect Bilirubin: 0.6 mg/dL (ref 0.3–0.9)
Total Bilirubin: 0.7 mg/dL (ref 0.3–1.2)
Total Protein: 7.8 g/dL (ref 6.5–8.1)

## 2019-11-11 LAB — URINALYSIS, ROUTINE W REFLEX MICROSCOPIC
Bilirubin Urine: NEGATIVE
Glucose, UA: NEGATIVE mg/dL
Ketones, ur: NEGATIVE mg/dL
Nitrite: NEGATIVE
Protein, ur: 30 mg/dL — AB
Specific Gravity, Urine: 1.04 — ABNORMAL HIGH (ref 1.005–1.030)
pH: 5 (ref 5.0–8.0)

## 2019-11-11 LAB — TROPONIN I (HIGH SENSITIVITY): Troponin I (High Sensitivity): 3 ng/L (ref <18)

## 2019-11-11 LAB — SARS CORONAVIRUS 2 BY RT PCR (HOSPITAL ORDER, PERFORMED IN ~~LOC~~ HOSPITAL LAB): SARS Coronavirus 2: NEGATIVE

## 2019-11-11 MED ORDER — DORZOLAMIDE HCL-TIMOLOL MAL 2-0.5 % OP SOLN
1.0000 [drp] | Freq: Two times a day (BID) | OPHTHALMIC | Status: DC
  Administered 2019-11-11 – 2019-11-16 (×11): 1 [drp] via OPHTHALMIC
  Filled 2019-11-11: qty 10

## 2019-11-11 MED ORDER — IOHEXOL 300 MG/ML  SOLN
100.0000 mL | Freq: Once | INTRAMUSCULAR | Status: AC | PRN
  Administered 2019-11-11: 100 mL via INTRAVENOUS

## 2019-11-11 MED ORDER — ALBUTEROL SULFATE HFA 108 (90 BASE) MCG/ACT IN AERS: 1.0000 | INHALATION_SPRAY | Freq: Four times a day (QID) | RESPIRATORY_TRACT | Status: DC | PRN

## 2019-11-11 MED ORDER — ACETAMINOPHEN 650 MG RE SUPP: 650.0000 mg | Freq: Four times a day (QID) | RECTAL | Status: DC | PRN

## 2019-11-11 MED ORDER — ALBUTEROL SULFATE (2.5 MG/3ML) 0.083% IN NEBU
2.5000 mg | INHALATION_SOLUTION | Freq: Four times a day (QID) | RESPIRATORY_TRACT | Status: DC | PRN
  Administered 2019-11-11 – 2019-11-16 (×6): 2.5 mg via RESPIRATORY_TRACT
  Filled 2019-11-11 (×6): qty 3

## 2019-11-11 MED ORDER — KETOROLAC TROMETHAMINE 0.5 % OP SOLN
1.0000 [drp] | Freq: Every day | OPHTHALMIC | Status: DC
  Administered 2019-11-11 – 2019-11-16 (×6): 1 [drp] via OPHTHALMIC
  Filled 2019-11-11: qty 3

## 2019-11-11 MED ORDER — HYDROMORPHONE HCL 1 MG/ML IJ SOLN
0.5000 mg | INTRAMUSCULAR | Status: DC | PRN
  Administered 2019-11-11 – 2019-11-12 (×6): 1 mg via INTRAVENOUS
  Administered 2019-11-12: 0.5 mg via INTRAVENOUS
  Administered 2019-11-12 – 2019-11-14 (×8): 1 mg via INTRAVENOUS
  Filled 2019-11-11 (×5): qty 1
  Filled 2019-11-11: qty 0.5
  Filled 2019-11-11 (×9): qty 1

## 2019-11-11 MED ORDER — ACETAMINOPHEN 325 MG PO TABS
650.0000 mg | ORAL_TABLET | Freq: Four times a day (QID) | ORAL | Status: DC | PRN
  Administered 2019-11-12 – 2019-11-15 (×3): 650 mg via ORAL
  Filled 2019-11-11 (×3): qty 2

## 2019-11-11 MED ORDER — SODIUM CHLORIDE 0.9 % IV BOLUS
1000.0000 mL | Freq: Once | INTRAVENOUS | Status: AC
  Administered 2019-11-11: 1000 mL via INTRAVENOUS

## 2019-11-11 MED ORDER — ONDANSETRON HCL 4 MG/2ML IJ SOLN
4.0000 mg | Freq: Four times a day (QID) | INTRAMUSCULAR | Status: DC | PRN
  Administered 2019-11-11 – 2019-11-14 (×9): 4 mg via INTRAVENOUS
  Filled 2019-11-11 (×9): qty 2

## 2019-11-11 MED ORDER — ONDANSETRON HCL 4 MG/2ML IJ SOLN
4.0000 mg | Freq: Once | INTRAMUSCULAR | Status: AC
  Administered 2019-11-11: 4 mg via INTRAVENOUS
  Filled 2019-11-11: qty 2

## 2019-11-11 MED ORDER — HYDROMORPHONE HCL 1 MG/ML IJ SOLN
1.0000 mg | Freq: Once | INTRAMUSCULAR | Status: AC
  Administered 2019-11-11: 1 mg via INTRAVENOUS
  Filled 2019-11-11: qty 1

## 2019-11-11 MED ORDER — HYDRALAZINE HCL 20 MG/ML IJ SOLN: 10.0000 mg | INTRAMUSCULAR | Status: DC | PRN

## 2019-11-11 MED ORDER — HYDROMORPHONE HCL 1 MG/ML IJ SOLN
1.0000 mg | INTRAMUSCULAR | Status: DC | PRN
  Administered 2019-11-11: 1 mg via INTRAVENOUS
  Filled 2019-11-11: qty 1

## 2019-11-11 MED ORDER — ONDANSETRON HCL 4 MG PO TABS
4.0000 mg | ORAL_TABLET | Freq: Four times a day (QID) | ORAL | Status: DC | PRN
  Administered 2019-11-14 – 2019-11-16 (×4): 4 mg via ORAL
  Filled 2019-11-11 (×4): qty 1

## 2019-11-11 MED ORDER — FENTANYL CITRATE (PF) 100 MCG/2ML IJ SOLN
50.0000 ug | Freq: Once | INTRAMUSCULAR | Status: AC
  Administered 2019-11-11: 50 ug via INTRAVENOUS
  Filled 2019-11-11: qty 2

## 2019-11-11 MED ORDER — SODIUM CHLORIDE 0.9 % IV SOLN: INTRAVENOUS | Status: DC

## 2019-11-11 MED ORDER — ALBUTEROL SULFATE (2.5 MG/3ML) 0.083% IN NEBU
2.5000 mg | INHALATION_SOLUTION | Freq: Two times a day (BID) | RESPIRATORY_TRACT | Status: DC
  Administered 2019-11-11 – 2019-11-16 (×10): 2.5 mg via RESPIRATORY_TRACT
  Filled 2019-11-11 (×11): qty 3

## 2019-11-11 MED ORDER — ENOXAPARIN SODIUM 40 MG/0.4ML ~~LOC~~ SOLN
40.0000 mg | SUBCUTANEOUS | Status: DC
  Administered 2019-11-11 – 2019-11-16 (×6): 40 mg via SUBCUTANEOUS
  Filled 2019-11-11 (×6): qty 0.4

## 2019-11-11 NOTE — Progress Notes (Addendum)
ASSUMPTION OF CARE NOTE   11/11/2019 2:07 PM  [name] [name] was seen and examined.  The H&P by the admitting provider, orders, imaging was reviewed.  Please see new orders.  Will continue to follow.   Pt having significant pain requiring IV pain medications.  Check lipid panel in AM.  Pt can have ice chips.   Active Problems:   Coronary atherosclerosis   COPD (chronic obstructive pulmonary disease) (HCC)   GERD (gastroesophageal reflux disease)   Insomnia   HTN (hypertension)   Pancreatitis   HOH (hard of hearing)   History of MI (myocardial infarction)   Vitals:   11/11/19 0810 11/11/19 1158  BP: (!) 142/86 108/80  Pulse: 85 75  Resp: 20 20  Temp: 98.2 F (36.8 C) 98.3 F (36.8 C)  SpO2: 98% 93%    Results for orders placed or performed during the hospital encounter of 11/10/19  SARS Coronavirus 2 by RT PCR (hospital order, performed in [place] hospital lab) Nasopharyngeal Nasopharyngeal Swab   Specimen: Nasopharyngeal Swab  Result Value Ref Range   SARS Coronavirus 2 NEGATIVE NEGATIVE  CBC  Result Value Ref Range   WBC 15.4 (H) 4.0 - 10.5 K/uL   RBC 5.47 4.22 - 5.81 MIL/uL   Hemoglobin 15.3 13.0 - 17.0 g/dL   HCT 48.7 39 - 52 %   MCV 89.0 80.0 - 100.0 fL   MCH 28.0 26.0 - 34.0 pg   MCHC 31.4 30.0 - 36.0 g/dL   RDW 13.5 11.5 - 15.5 %   Platelets 248 150 - 400 K/uL   nRBC 0.0 0.0 - 0.2 %  Basic metabolic panel  Result Value Ref Range   Sodium 140 135 - 145 mmol/L   Potassium 4.2 3.5 - 5.1 mmol/L   Chloride 102 98 - 111 mmol/L   CO2 29 22 - 32 mmol/L   Glucose, Bld 155 (H) 70 - 99 mg/dL   BUN 14 8 - 23 mg/dL   Creatinine, Ser 1.08 0.61 - 1.24 mg/dL   Calcium 9.6 8.9 - 10.3 mg/dL   GFR calc non Af Amer >60 >60 mL/min   GFR calc Af Amer >60 >60 mL/min   Anion gap 9 5 - 15  Lipase, blood  Result Value Ref Range   Lipase 5,229 (H) 11 - 51 U/L  Hepatic function panel  Result Value Ref Range   Total Protein 7.8 6.5 - 8.1 g/dL   Albumin 4.3 3.5 - 5.0 g/dL    AST 25 15 - 41 U/L   ALT 20 0 - 44 U/L   Alkaline Phosphatase 67 38 - 126 U/L   Total Bilirubin 0.7 0.3 - 1.2 mg/dL   Bilirubin, Direct 0.1 0.0 - 0.2 mg/dL   Indirect Bilirubin 0.6 0.3 - 0.9 mg/dL  Troponin I (High Sensitivity)  Result Value Ref Range   Troponin I (High Sensitivity) 4 <18 ng/L  Troponin I (High Sensitivity)  Result Value Ref Range   Troponin I (High Sensitivity) 3 <18 ng/L   Time Spent 30 minutes   Murvin Natal, MD [facility]   11/10/2019 11:03 PM How to contact the [facility] Attending or Consulting provider [place] or covering provider during after hours 7P -7A, for this patient?  1. Check the care team in [facility] and look for a) attending/consulting [facility] provider listed and b) the [facility] team listed 2. Log into [email or web address] and use [place]'s universal password to access. If you do not have the password, please contact  the hospital operator. 3. Locate the [facility] provider you are looking for under [facility] and page to a number that you can be directly reached. 4. If you still have difficulty reaching the provider, please page the [facility] (Director on Call) for the Hospitalists listed on amion for assistance.

## 2019-11-11 NOTE — ED Provider Notes (Signed)
[facility] EMERGENCY DEPARTMENT Provider Note   CSN: [id number] Arrival date & time: 11/10/19  2107   Time seen 1:05 AM  History Chief Complaint  Patient presents with  . Abdominal Pain    [name] [name] is a 79 y.o. male.  HPI   Patient states about 7 hours ago he started having diffuse abdominal pain that now seems to be more in his right lower quadrant spreading up to his right upper quadrant and into his chest.  He states it is aching, burning, pressure, and grabbing.  He denies nausea now but states he did have one episode of vomiting around 7 PM.  He has had diarrhea for the past week and states the past 2 days he has had about 3 episodes a day that he describes as watery.  He denies fever but has had chills.  He has dysuria without frequency.  He states movement and breathing makes the pain worse.  Nothing makes it feel better.  He states he is never had it before.  He feels like his abdomen is bloated and he has decreased appetite.  He states the episode of vomiting was after he tried to make himself eat.  He states he had diverticulitis many years ago and he also had a colon resection and cholecystectomy done several years ago.  He was on prednisone for his a high but that was stopped on June 23.  PCP Erven Colla, DO   Past Medical History:  Diagnosis Date  . AAA (abdominal aortic aneurysm) ([place])    needs yearly ultrasound  . Allergy   . Anemia   . Arthritis   . Asthma   . BPH (benign prostatic hyperplasia)   . CAD (coronary artery disease)   . Cancer ([place])    skin cancer  . COPD (chronic obstructive pulmonary disease) ([place])   . Dysrhythmia    pt. states it can be fast at times  . GERD (gastroesophageal reflux disease)   . Glaucoma   . HOH (hard of hearing)   . Hypercholesterolemia   . Hypertension   . Impaired fasting glucose   . Low back pain   . MI (myocardial infarction) ([place]) 1999  . Thrush     Patient Active Problem List   Diagnosis Date Noted  .  Recurrent epistaxis 06/01/2017  . CAP (community acquired pneumonia) 09/16/2016  . Pleuritic chest pain 09/16/2016  . HTN (hypertension) 09/16/2016  . Herpes zoster without complication 83/15/1761  . Oral candidiasis 04/01/2016  . Prostate hypertrophy 03/06/2016  . Depression 04/06/2015  . Ulnar neuropathy 12/14/2014  . Leg swelling 12/05/2014  . Neuropathic pain 12/05/2014  . S/P spinal surgery 12/05/2014  . Venous stasis dermatitis 12/05/2014  . Insomnia 12/05/2014  . Generalized pain   . UTI (lower urinary tract infection) 11/11/2014  . Thrush of mouth and esophagus ([place]) 11/11/2014  . Urinary retention   . Urinary tract infectious disease   . Lumbar spondylosis 11/05/2014  . Colon neoplasm 06/11/2013  . GERD (gastroesophageal reflux disease) 01/19/2012  . PAC (premature atrial contraction) 09/15/2010  . ELEVATED BP READING WITHOUT DX HYPERTENSION 05/29/2009  . Mixed hyperlipidemia 04/15/2008  . BEN HTN HEART DISEASE WITHOUT HEART FAIL 04/15/2008  . Coronary atherosclerosis 04/15/2008  . Abdominal aortic aneurysm ([place]) 04/15/2008  . COPD (chronic obstructive pulmonary disease) ([place]) 04/15/2008    Past Surgical History:  Procedure Laterality Date  . BACK SURGERY     x 3  . CARDIAC CATHETERIZATION  angioplasty  . CATARACT EXTRACTION W/PHACO  03/20/2012   Procedure: CATARACT EXTRACTION PHACO AND INTRAOCULAR LENS PLACEMENT (IOC);  Surgeon: Williams Che, MD;  Location: [facility];  Service: Ophthalmology;  Laterality: Right;  CDE:  8.45  . CATARACT EXTRACTION W/PHACO Left 04/02/2013   Procedure: CATARACT EXTRACTION PHACO AND INTRAOCULAR LENS PLACEMENT (IOC);  Surgeon: Williams Che, MD;  Location: [facility];  Service: Ophthalmology;  Laterality: Left;  CDE:  6.50  . CHOLECYSTECTOMY  2000  . COLONOSCOPY  2009   repeat 5 years  . ESOPHAGOGASTRODUODENOSCOPY    . HERNIA REPAIR Left    inguinal  . LAPAROSCOPIC PARTIAL COLECTOMY N/A 06/11/2013   Procedure: LAPAROSCOPIC HAND  ASSISTED PARTIAL COLECTOMY;  Surgeon: Jamesetta So, MD;  Location: [facility];  Service: General;  Laterality: N/A;  . right eye detached retina Bilateral   . SPINAL FUSION  2016  . YAG LASER APPLICATION Left 65/78/4696   Procedure: YAG LASER APPLICATION;  Surgeon: Williams Che, MD;  Location: [facility];  Service: Ophthalmology;  Laterality: Left;       Family History  Problem Relation Age of Onset  . Hypertension Mother   . COPD Father   . Cancer Brother        brain    Social History   Tobacco Use  . Smoking status: Former Smoker    Packs/day: 1.00    Years: 35.00    Pack years: 35.00    Types: Cigarettes    Quit date: 03/28/2003    Years since quitting: 16.6  . Smokeless tobacco: Never Used  Vaping Use  . Vaping Use: Never used  Substance Use Topics  . Alcohol use: No    Alcohol/week: 0.0 standard drinks  . Drug use: No    Home Medications Prior to Admission medications   Medication Sig Start Date End Date Taking? Authorizing Provider  albuterol (PROVENTIL) (2.5 MG/3ML) 0.083% nebulizer solution INHALE 1 VIAL VIA NEBULIZER FOUR TIMES DAILY 10/11/19   Mikey Kirschner, MD  cetirizine (ZYRTEC) 10 MG tablet Take 10 mg by mouth daily.    [provider]  dorzolamide-timolol (COSOPT) 22.3-6.8 MG/ML ophthalmic solution Place 1 drop into the left eye 2 (two) times daily. 09/25/19   [provider]  esomeprazole (NEXIUM) 20 MG capsule Take 20 mg by mouth daily at 12 noon.    [provider]  LORazepam (ATIVAN) 1 MG tablet Take 0.5 tablets (0.5 mg total) by mouth at bedtime. 08/13/19   Mikey Kirschner, MD  losartan (COZAAR) 25 MG tablet Take 1 tablet (25 mg total) by mouth daily. 08/13/19   Mikey Kirschner, MD  lovastatin (MEVACOR) 40 MG tablet Take 1 tablet (40 mg total) by mouth daily. 08/13/19   Mikey Kirschner, MD  Multiple Vitamin (MULTIVITAMIN) tablet Take 1 tablet by mouth daily.    [provider]  nitroGLYCERIN (NITROSTAT) 0.4 MG  SL tablet Place 1 tablet (0.4 mg total) under the tongue every 5 (five) minutes as needed for chest pain. 02/26/19   Josue Hector, MD  nystatin (MYCOSTATIN) 100000 UNIT/ML suspension Take 5 mLs by mouth every 6 (six) hours. 10/19/19   [provider]  PROAIR HFA 108 (90 Base) MCG/ACT inhaler INHALE 2 PUFFS INTO THE LUNGS EVERY SIX HOURS AS NEEDED FOR WHEEZING. 01/05/19   Mikey Kirschner, MD  PROLENSA 0.07 % SOLN Place 1 drop into the left eye daily. 09/26/19   [provider]    Allergies  Lasix [furosemide], Cefzil [cefprozil], Dexamethasone, Gabapentin, Neomycin, Tetracyclines & related, Ciprofloxacin, Methocarbamol, and Penicillins  Review of Systems   Review of Systems  All other systems reviewed and are negative.   Physical Exam Updated Vital Signs BP 136/84   Pulse 89   Temp 97.6 F (36.4 C) (Oral)   Resp 18   Ht 6\' 1"  (1.854 m)   Wt 90.7 kg   SpO2 96%   BMI 26.39 kg/m   Physical Exam Vitals and nursing note reviewed.  Constitutional:      General: He is in acute distress.     Appearance: Normal appearance.     Comments: Patient wants to stand or sit in the bedside chair rather than lay down  HENT:     Head: Normocephalic and atraumatic.     Right Ear: External ear normal.     Left Ear: External ear normal.     Mouth/Throat:     Mouth: Mucous membranes are dry.     Pharynx: No oropharyngeal exudate or posterior oropharyngeal erythema.  Eyes:     Extraocular Movements: Extraocular movements intact.     Conjunctiva/sclera: Conjunctivae normal.     Comments: Patient's right pupil is small, his left pupil is larger and irregular  Cardiovascular:     Rate and Rhythm: Normal rate and regular rhythm.     Pulses: Normal pulses.     Heart sounds: Normal heart sounds.  Pulmonary:     Effort: Pulmonary effort is normal. No respiratory distress.     Breath sounds: Normal breath sounds.  Abdominal:     General: Bowel sounds are normal. There is  distension.     Tenderness: There is generalized abdominal tenderness.  Musculoskeletal:        General: Normal range of motion.     Cervical back: Normal range of motion.  Skin:    General: Skin is warm and dry.  Neurological:     General: No focal deficit present.     Mental Status: He is alert and oriented to person, place, and time.     Cranial Nerves: No cranial nerve deficit.  Psychiatric:        Mood and Affect: Mood normal.        Behavior: Behavior normal.        Thought Content: Thought content normal.     ED Results / Procedures / Treatments   Labs (all labs ordered are listed, but only abnormal results are displayed) Results for orders placed or performed during the hospital encounter of 11/10/19  CBC  Result Value Ref Range   WBC 15.4 (H) 4.0 - 10.5 K/uL   RBC 5.47 4.22 - 5.81 MIL/uL   Hemoglobin 15.3 13.0 - 17.0 g/dL   HCT 48.7 39 - 52 %   MCV 89.0 80.0 - 100.0 fL   MCH 28.0 26.0 - 34.0 pg   MCHC 31.4 30.0 - 36.0 g/dL   RDW 13.5 11.5 - 15.5 %   Platelets 248 150 - 400 K/uL   nRBC 0.0 0.0 - 0.2 %  Basic metabolic panel  Result Value Ref Range   Sodium 140 135 - 145 mmol/L   Potassium 4.2 3.5 - 5.1 mmol/L   Chloride 102 98 - 111 mmol/L   CO2 29 22 - 32 mmol/L   Glucose, Bld 155 (H) 70 - 99 mg/dL   BUN 14 8 - 23 mg/dL   Creatinine, Ser 1.08 0.61 - 1.24 mg/dL   Calcium 9.6 8.9 - 10.3 mg/dL  GFR calc non Af Amer >60 >60 mL/min   GFR calc Af Amer >60 >60 mL/min   Anion gap 9 5 - 15  Lipase, blood  Result Value Ref Range   Lipase 5,229 (H) 11 - 51 U/L  Hepatic function panel  Result Value Ref Range   Total Protein 7.8 6.5 - 8.1 g/dL   Albumin 4.3 3.5 - 5.0 g/dL   AST 25 15 - 41 U/L   ALT 20 0 - 44 U/L   Alkaline Phosphatase 67 38 - 126 U/L   Total Bilirubin 0.7 0.3 - 1.2 mg/dL   Bilirubin, Direct 0.1 0.0 - 0.2 mg/dL   Indirect Bilirubin 0.6 0.3 - 0.9 mg/dL  Troponin I (High Sensitivity)  Result Value Ref Range   Troponin I (High Sensitivity) 4 <18  ng/L  Troponin I (High Sensitivity)  Result Value Ref Range   Troponin I (High Sensitivity) 3 <18 ng/L   Laboratory interpretation all normal except very elevated lipase    EKG EKG Interpretation  Date/Time:  Saturday November 10 2019 21:35:30 EDT Ventricular Rate:  87 PR Interval:  140 QRS Duration: 74 QT Interval:  354 QTC Calculation: 425 R Axis:   68 Text Interpretation: Normal sinus rhythm Nonspecific ST and T wave abnormality Electrode noise Baseline wander Since last tracing rate slower 16 Sep 2016 Confirmed by Rolland Porter [phone]) on 11/10/2019 11:10:14 PM   Radiology CT Abdomen Pelvis W Contrast  Result Date: 11/11/2019 CLINICAL DATA:  Epigastric abdominal pain EXAM: CT ABDOMEN AND PELVIS WITH CONTRAST TECHNIQUE: Multidetector CT imaging of the abdomen and pelvis was performed using the standard protocol following bolus administration of intravenous contrast. CONTRAST:  163mL OMNIPAQUE IOHEXOL 300 MG/ML  SOLN COMPARISON:  06/30/2017 FINDINGS: LOWER CHEST: Normal. HEPATOBILIARY: Normal hepatic contours. No intra- or extrahepatic biliary dilatation. Status post cholecystectomy. PANCREAS: There is inflammatory stranding surrounding the pancreas. No peripancreatic fluid collection. No pancreatic necrosis. Fluid and inflammation track into the retroperitoneum/anterior pararenal space along the duodenum. SPLEEN: Normal. ADRENALS/URINARY TRACT: The adrenal glands are normal. No hydronephrosis, nephroureterolithiasis or solid renal mass. The urinary bladder is normal for degree of distention STOMACH/BOWEL: There is no hiatal hernia. Normal duodenal course and caliber. No small bowel dilatation or inflammation. Rectosigmoid diverticulosis without acute inflammation. The appendix is not visualized. No right lower quadrant inflammation or free fluid. VASCULAR/LYMPHATIC: 4 cm abdominal aortic aneurysm, unchanged. Moderate volume noncalcified atherosclerotic plaque. REPRODUCTIVE: Enlarged prostate  measures 5.7 cm in transverse dimension. MUSCULOSKELETAL. Multilevel degenerative disc disease. Grade 1 retrolisthesis at L3-4. L5-S1 PLIF. OTHER: None. IMPRESSION: 1. Acute pancreatitis without peripancreatic fluid collection or pancreatic necrosis. 2. Rectosigmoid diverticulosis without acute inflammation. 3. Recommend followup by ultrasound in 1 year. This recommendation follows ACR consensus guidelines: White Paper of the ACR Incidental Findings Committee II on Vascular Findings. J Am Coll Radiol 2013; 10:789-794. Aortic aneurysm NOS (ICD10-I71.9) Aortic atherosclerosis (ICD10-I70.0). Electronically Signed   By: Ulyses Jarred M.D.   On: 11/11/2019 02:47    Procedures Procedures (including critical care time)  Medications Ordered in ED Medications  fentaNYL (SUBLIMAZE) injection 50 mcg (50 mcg Intravenous Given 11/11/19 0136)  ondansetron (ZOFRAN) injection 4 mg (4 mg Intravenous Given 11/11/19 0137)  sodium chloride 0.9 % bolus 1,000 mL (0 mLs Intravenous Stopped 11/11/19 0309)  iohexol (OMNIPAQUE) 300 MG/ML solution 100 mL (100 mLs Intravenous Contrast Given 11/11/19 0238)  HYDROmorphone (DILAUDID) injection 1 mg (1 mg Intravenous Given 11/11/19 0318)    ED Course  I have reviewed the triage vital signs  and the nursing notes.  Pertinent labs & imaging results that were available during my care of the patient were reviewed by me and considered in my medical decision making (see chart for details).    MDM Rules/Calculators/A&P                          We discussed his lab reports which was consistent with pancreatitis.  He is concerned because his wife had pancreatic cancer.  CT was ordered.  Patient was given IV pain, fentanyl and nausea medicine.  Patient CT shows acute pancreatitis.  When I went to talk to the patient about the result he states his pain is starting to return.  He was given IV Dilaudid for pain.  3:30 AM Dr. Darrick Meigs, hospitalist will admit   Final Clinical Impression(s) /  ED Diagnoses Final diagnoses:  Acute pancreatitis without infection or necrosis, unspecified pancreatitis type    Rx / DC Orders  Plan admission  Rolland Porter, MD, Barbette Or, MD 11/11/19 [id number]

## 2019-11-11 NOTE — H&P (Signed)
TRH H&P    Patient Demographics:    [name] [name], is a 79 y.o. male  MRN: [id number]  DOB - 02-26-1941  Admit Date - 11/10/2019  Referring MD/NP/PA: Dr. Tomi Bamberger  Outpatient Primary MD for the patient is Erven Colla, DO  Patient coming from: Home  Chief complaint-abdominal pain   HPI:    [name] [name]  is a 79 y.o. male, with medical history of AAA, asthma, COPD, GERD, CAD came to hospital with diffuse abdominal pain.  Patient says that pain started after he ate sandwich yesterday, after that he developed bloating and pressure in abdomen.  He denies nausea but had one episode of vomiting around 7 PM.  Patient has had diarrhea for past 1 week and stated that for past 2 days he had 3 episodes of watery bowel movement.  Denies any fever or chills.  Patient said that he was on prednisone, which was prescribed by his ophthalmologist.  It was stopped on 11/07/2019.  Patient had eye surgery recently and is currently on PROLENSA and Cosopt eyedrops. Denies passing out. He denies drinking alcohol Patient had cholecystectomy done in 2000 In the ED lab work showed lipase elevated to 5229 LFTs are normal CT abdomen pelvis shows pancreatitis.    Review of systems:    In addition to the HPI above,    All other systems reviewed and are negative.    Past History of the following :    Past Medical History:  Diagnosis Date  . AAA (abdominal aortic aneurysm) ([place])    needs yearly ultrasound  . Allergy   . Anemia   . Arthritis   . Asthma   . BPH (benign prostatic hyperplasia)   . CAD (coronary artery disease)   . Cancer ([place])    skin cancer  . COPD (chronic obstructive pulmonary disease) ([place])   . Dysrhythmia    pt. states it can be fast at times  . GERD (gastroesophageal reflux disease)   . Glaucoma   . HOH (hard of hearing)   . Hypercholesterolemia   . Hypertension   . Impaired fasting glucose   . Low  back pain   . MI (myocardial infarction) ([place]) 1999  . Thrush       Past Surgical History:  Procedure Laterality Date  . BACK SURGERY     x 3  . CARDIAC CATHETERIZATION     angioplasty  . CATARACT EXTRACTION W/PHACO  03/20/2012   Procedure: CATARACT EXTRACTION PHACO AND INTRAOCULAR LENS PLACEMENT (IOC);  Surgeon: Williams Che, MD;  Location: [facility];  Service: Ophthalmology;  Laterality: Right;  CDE:  8.45  . CATARACT EXTRACTION W/PHACO Left 04/02/2013   Procedure: CATARACT EXTRACTION PHACO AND INTRAOCULAR LENS PLACEMENT (IOC);  Surgeon: Williams Che, MD;  Location: [facility];  Service: Ophthalmology;  Laterality: Left;  CDE:  6.50  . CHOLECYSTECTOMY  2000  . COLONOSCOPY  2009   repeat 5 years  . ESOPHAGOGASTRODUODENOSCOPY    . HERNIA REPAIR Left    inguinal  . LAPAROSCOPIC PARTIAL COLECTOMY N/A 06/11/2013  Procedure: LAPAROSCOPIC HAND ASSISTED PARTIAL COLECTOMY;  Surgeon: Jamesetta So, MD;  Location: [facility];  Service: General;  Laterality: N/A;  . right eye detached retina Bilateral   . SPINAL FUSION  2016  . YAG LASER APPLICATION Left 27/74/1287   Procedure: YAG LASER APPLICATION;  Surgeon: Williams Che, MD;  Location: [facility];  Service: Ophthalmology;  Laterality: Left;      Social History:      Social History   Tobacco Use  . Smoking status: Former Smoker    Packs/day: 1.00    Years: 35.00    Pack years: 35.00    Types: Cigarettes    Quit date: 03/28/2003    Years since quitting: 16.6  . Smokeless tobacco: Never Used  Substance Use Topics  . Alcohol use: No    Alcohol/week: 0.0 standard drinks       Family History :     Family History  Problem Relation Age of Onset  . Hypertension Mother   . COPD Father   . Cancer Brother        brain      Home Medications:   Prior to Admission medications   Medication Sig Start Date End Date Taking? Authorizing Provider  albuterol (PROVENTIL) (2.5 MG/3ML) 0.083% nebulizer solution INHALE 1 VIAL VIA  NEBULIZER FOUR TIMES DAILY 10/11/19   Mikey Kirschner, MD  cetirizine (ZYRTEC) 10 MG tablet Take 10 mg by mouth daily.    [provider]  dorzolamide-timolol (COSOPT) 22.3-6.8 MG/ML ophthalmic solution Place 1 drop into the left eye 2 (two) times daily. 09/25/19   [provider]  esomeprazole (NEXIUM) 20 MG capsule Take 20 mg by mouth daily at 12 noon.    [provider]  LORazepam (ATIVAN) 1 MG tablet Take 0.5 tablets (0.5 mg total) by mouth at bedtime. 08/13/19   Mikey Kirschner, MD  losartan (COZAAR) 25 MG tablet Take 1 tablet (25 mg total) by mouth daily. 08/13/19   Mikey Kirschner, MD  lovastatin (MEVACOR) 40 MG tablet Take 1 tablet (40 mg total) by mouth daily. 08/13/19   Mikey Kirschner, MD  Multiple Vitamin (MULTIVITAMIN) tablet Take 1 tablet by mouth daily.    [provider]  nitroGLYCERIN (NITROSTAT) 0.4 MG SL tablet Place 1 tablet (0.4 mg total) under the tongue every 5 (five) minutes as needed for chest pain. 02/26/19   Josue Hector, MD  nystatin (MYCOSTATIN) 100000 UNIT/ML suspension Take 5 mLs by mouth every 6 (six) hours. 10/19/19   [provider]  PROAIR HFA 108 (90 Base) MCG/ACT inhaler INHALE 2 PUFFS INTO THE LUNGS EVERY SIX HOURS AS NEEDED FOR WHEEZING. 01/05/19   Mikey Kirschner, MD  PROLENSA 0.07 % SOLN Place 1 drop into the left eye daily. 09/26/19   [provider]     Allergies:     Allergies  Allergen Reactions  . Lasix [Furosemide] Rash  . Cefzil [Cefprozil] Nausea Only  . Dexamethasone Swelling  . Gabapentin     Legs swell up   . Neomycin Other (See Comments)    Patient can't remember.  . Tetracyclines & Related Itching  . Ciprofloxacin Nausea And Vomiting, Rash and Other (See Comments)    Body aches   . Methocarbamol Swelling and Rash  . Penicillins Rash    Has patient had a PCN reaction causing immediate rash, facial/tongue/throat swelling, SOB or lightheadedness with hypotension: yes Has  patient had a PCN reaction causing severe rash involving mucus membranes  or skin necrosis: unknown Has patient had a PCN reaction that required hospitalization: no Has patient had a PCN reaction occurring within the last 10 years: No If all of the above answers are "NO", then may proceed with Cephalosporin use.      Physical Exam:   Vitals  Blood pressure 136/84, pulse 89, temperature 97.6 F (36.4 C), temperature source Oral, resp. rate 18, height 6\' 1"  (1.854 m), weight 90.7 kg, SpO2 96 %.  1.  General: Appears in no acute distress  2. Psychiatric: Alert, oriented x3, intact insight and judgment  3. Neurologic: Cranial nerves II through XII grossly intact, no focal deficit noted  4. HEENMT:  Atraumatic normocephalic, extraocular muscles are intact  5. Respiratory : Clear to auscultation bilaterally, no wheezing or crackles auscultated  6. Cardiovascular : S1-S2, regular, no murmur auscultated  7. Gastrointestinal:  Abdomen is soft, mild epigastric tenderness to palpation  8. Skin:  No rashes noted     Data Review:    CBC Recent Labs  Lab 11/10/19 2150  WBC 15.4*  HGB 15.3  HCT 48.7  PLT 248  MCV 89.0  MCH 28.0  MCHC 31.4  RDW 13.5   ------------------------------------------------------------------------------------------------------------------  Results for orders placed or performed during the hospital encounter of 11/10/19 (from the past 48 hour(s))  CBC     Status: Abnormal   Collection Time: 11/10/19  9:50 PM  Result Value Ref Range   WBC 15.4 (H) 4.0 - 10.5 K/uL   RBC 5.47 4.22 - 5.81 MIL/uL   Hemoglobin 15.3 13.0 - 17.0 g/dL   HCT 48.7 39 - 52 %   MCV 89.0 80.0 - 100.0 fL   MCH 28.0 26.0 - 34.0 pg   MCHC 31.4 30.0 - 36.0 g/dL   RDW 13.5 11.5 - 15.5 %   Platelets 248 150 - 400 K/uL   nRBC 0.0 0.0 - 0.2 %    Comment: Performed at [facility], [address], [place], [place] [address]  Basic metabolic panel     Status: Abnormal    Collection Time: 11/10/19  9:50 PM  Result Value Ref Range   Sodium 140 135 - 145 mmol/L   Potassium 4.2 3.5 - 5.1 mmol/L   Chloride 102 98 - 111 mmol/L   CO2 29 22 - 32 mmol/L   Glucose, Bld 155 (H) 70 - 99 mg/dL    Comment: Glucose reference range applies only to samples taken after fasting for at least 8 hours.   BUN 14 8 - 23 mg/dL   Creatinine, Ser 1.08 0.61 - 1.24 mg/dL   Calcium 9.6 8.9 - 10.3 mg/dL   GFR calc non Af Amer >60 >60 mL/min   GFR calc Af Amer >60 >60 mL/min   Anion gap 9 5 - 15    Comment: Performed at [facility], [address] [place], [place] [address]  Lipase, blood     Status: Abnormal   Collection Time: 11/10/19  9:50 PM  Result Value Ref Range   Lipase 5,229 (H) 11 - 51 U/L    Comment: RESULTS CONFIRMED BY MANUAL DILUTION Performed at [facility], [address] [place],  [address]   Troponin I (High Sensitivity)     Status: None   Collection Time: 11/10/19  9:50 PM  Result Value Ref Range   Troponin I (High Sensitivity) 4 <18 ng/L    Comment: (NOTE) Elevated high sensitivity troponin I (hsTnI) values and significant  changes across serial measurements may suggest ACS  but many other  chronic and acute conditions are known to elevate hsTnI results.  Refer to the Links section for chest pain algorithms and additional  guidance. Performed at [facility], [address], [place], [place] [address]   Troponin I (High Sensitivity)     Status: None   Collection Time: 11/10/19 11:41 PM  Result Value Ref Range   Troponin I (High Sensitivity) 3 <18 ng/L    Comment: (NOTE) Elevated high sensitivity troponin I (hsTnI) values and significant  changes across serial measurements may suggest ACS but many other  chronic and acute conditions are known to elevate hsTnI results.  Refer to the "Links" section for chest pain algorithms and additional  guidance. Performed at [facility], [address], [place], [place] [address]   Hepatic function panel      Status: None   Collection Time: 11/10/19 11:41 PM  Result Value Ref Range   Total Protein 7.8 6.5 - 8.1 g/dL   Albumin 4.3 3.5 - 5.0 g/dL   AST 25 15 - 41 U/L   ALT 20 0 - 44 U/L   Alkaline Phosphatase 67 38 - 126 U/L   Total Bilirubin 0.7 0.3 - 1.2 mg/dL   Bilirubin, Direct 0.1 0.0 - 0.2 mg/dL   Indirect Bilirubin 0.6 0.3 - 0.9 mg/dL    Comment: Performed at [facility], [address] [place], [place] [address]    Chemistries  Recent Labs  Lab 11/10/19 2150 11/10/19 2341  NA 140  --   K 4.2  --   CL 102  --   CO2 29  --   GLUCOSE 155*  --   BUN 14  --   CREATININE 1.08  --   CALCIUM 9.6  --   AST  --  25  ALT  --  20  ALKPHOS  --  67  BILITOT  --  0.7   ------------------------------------------------------------------------------------------------------------------  ------------------------------------------------------------------------------------------------------------------ GFR: Estimated Creatinine Clearance: 63.7 mL/min (by C-G formula based on SCr of 1.08 mg/dL). Liver Function Tests: Recent Labs  Lab 11/10/19 2341  AST 25  ALT 20  ALKPHOS 67  BILITOT 0.7  PROT 7.8  ALBUMIN 4.3   Recent Labs  Lab 11/10/19 2150  LIPASE 5,229*    --------------------------------------------------------------------------------------------------------------- Urine analysis:    Component Value Date/Time   COLORURINE YELLOW 09/16/2016 0242   APPEARANCEUR CLEAR 09/16/2016 0242   LABSPEC 1.023 09/16/2016 0242   PHURINE 5.0 09/16/2016 0242   GLUCOSEU NEGATIVE 09/16/2016 0242   HGBUR NEGATIVE 09/16/2016 0242   BILIRUBINUR + 05/25/2018 1322   KETONESUR NEGATIVE 09/16/2016 0242   PROTEINUR NEGATIVE 09/16/2016 0242   UROBILINOGEN 2.0 03/05/2016 1544   UROBILINOGEN 0.2 12/04/2014 0240   NITRITE NEGATIVE 09/16/2016 0242   LEUKOCYTESUR NEGATIVE 09/16/2016 0242      Imaging Results:    CT Abdomen Pelvis W Contrast  Result Date: 11/11/2019 CLINICAL DATA:   Epigastric abdominal pain EXAM: CT ABDOMEN AND PELVIS WITH CONTRAST TECHNIQUE: Multidetector CT imaging of the abdomen and pelvis was performed using the standard protocol following bolus administration of intravenous contrast. CONTRAST:  162mL OMNIPAQUE IOHEXOL 300 MG/ML  SOLN COMPARISON:  06/30/2017 FINDINGS: LOWER CHEST: Normal. HEPATOBILIARY: Normal hepatic contours. No intra- or extrahepatic biliary dilatation. Status post cholecystectomy. PANCREAS: There is inflammatory stranding surrounding the pancreas. No peripancreatic fluid collection. No pancreatic necrosis. Fluid and inflammation track into the retroperitoneum/anterior pararenal space along the duodenum. SPLEEN: Normal. ADRENALS/URINARY TRACT: The adrenal glands are normal. No hydronephrosis, nephroureterolithiasis or solid renal mass.  The urinary bladder is normal for degree of distention STOMACH/BOWEL: There is no hiatal hernia. Normal duodenal course and caliber. No small bowel dilatation or inflammation. Rectosigmoid diverticulosis without acute inflammation. The appendix is not visualized. No right lower quadrant inflammation or free fluid. VASCULAR/LYMPHATIC: 4 cm abdominal aortic aneurysm, unchanged. Moderate volume noncalcified atherosclerotic plaque. REPRODUCTIVE: Enlarged prostate measures 5.7 cm in transverse dimension. MUSCULOSKELETAL. Multilevel degenerative disc disease. Grade 1 retrolisthesis at L3-4. L5-S1 PLIF. OTHER: None. IMPRESSION: 1. Acute pancreatitis without peripancreatic fluid collection or pancreatic necrosis. 2. Rectosigmoid diverticulosis without acute inflammation. 3. Recommend followup by ultrasound in 1 year. This recommendation follows ACR consensus guidelines: White Paper of the ACR Incidental Findings Committee II on Vascular Findings. J Am Coll Radiol 2013; 10:789-794. Aortic aneurysm NOS (ICD10-I71.9) Aortic atherosclerosis (ICD10-I70.0). Electronically Signed   By: Ulyses Jarred M.D.   On: 11/11/2019 02:47    My  personal review of EKG: Rhythm NSR, nonspecific ST changes   Assessment & Plan:    Active Problems:   Pancreatitis   1. Acute pancreatitis-unclear etiology, patient does not drink alcohol, had cholecystectomy in 2000, LFTs are normal.  No new medication has been started.  He is taking eyedrops which was prescribed by his ophthalmologist for past 2 months.  Lipase 5227.  I called and discussed with [facility] on-call at [facility], no need to transfer to [facility] at this time.  We will start IV normal saline at 125 mL/h.  Once lipase improves, consider MRI abdomen in next 1 to 2 days to look for CBD stone.  Triglyceride level was 71 on 09/05/2019. 2. History of asthma-continue albuterol as needed 3. Hypertension-hold Cozaar, will start hydralazine 10 mg IV every 4 hours as needed for BP greater than 160/100   DVT Prophylaxis-   Lovenox   AM Labs Ordered, also please review Full Orders  Family Communication: Admission, patients condition and plan of care including tests being ordered have been discussed with the patient who indicate understanding and agree with the plan and Code Status.  Code Status: Full code  Admission status: Inpatient :The appropriate admission status for this patient is INPATIENT. Inpatient status is judged to be reasonable and necessary in order to provide the required intensity of service to ensure the patient's safety. The patient's presenting symptoms, physical exam findings, and initial radiographic and laboratory data in the context of their chronic comorbidities is felt to place them at high risk for further clinical deterioration. Furthermore, it is not anticipated that the patient will be medically stable for discharge from the hospital within 2 midnights of admission. The following factors support the admission status of inpatient.     The patient's presenting symptoms include abdominal pain. The worrisome physical exam findings include abdominal pain. The  initial radiographic and laboratory data are worrisome because of pancreatitis. The chronic co-morbidities include hypertension.       * I certify that at the point of admission it is my clinical judgment that the patient will require inpatient hospital care spanning beyond 2 midnights from the point of admission due to high intensity of service, high risk for further deterioration and high frequency of surveillance required.*  Time spent in minutes : 60 minutes   Kathern Lobosco S Kayona Foor M.D

## 2019-11-12 ENCOUNTER — Encounter (HOSPITAL_COMMUNITY): Payer: Self-pay | Admitting: Family Medicine

## 2019-11-12 DIAGNOSIS — K219 Gastro-esophageal reflux disease without esophagitis: Secondary | ICD-10-CM

## 2019-11-12 DIAGNOSIS — J410 Simple chronic bronchitis: Secondary | ICD-10-CM

## 2019-11-12 DIAGNOSIS — K8500 Idiopathic acute pancreatitis without necrosis or infection: Principal | ICD-10-CM

## 2019-11-12 LAB — CBC
HCT: 43.6 % (ref 39.0–52.0)
Hemoglobin: 13.1 g/dL (ref 13.0–17.0)
MCH: 27.7 pg (ref 26.0–34.0)
MCHC: 30 g/dL (ref 30.0–36.0)
MCV: 92.2 fL (ref 80.0–100.0)
Platelets: 201 10*3/uL (ref 150–400)
RBC: 4.73 MIL/uL (ref 4.22–5.81)
RDW: 13.8 % (ref 11.5–15.5)
WBC: 15.3 10*3/uL — ABNORMAL HIGH (ref 4.0–10.5)
nRBC: 0 % (ref 0.0–0.2)

## 2019-11-12 LAB — COMPREHENSIVE METABOLIC PANEL
ALT: 16 U/L (ref 0–44)
AST: 16 U/L (ref 15–41)
Albumin: 3.3 g/dL — ABNORMAL LOW (ref 3.5–5.0)
Alkaline Phosphatase: 51 U/L (ref 38–126)
Anion gap: 9 (ref 5–15)
BUN: 15 mg/dL (ref 8–23)
CO2: 28 mmol/L (ref 22–32)
Calcium: 8.2 mg/dL — ABNORMAL LOW (ref 8.9–10.3)
Chloride: 102 mmol/L (ref 98–111)
Creatinine, Ser: 1 mg/dL (ref 0.61–1.24)
GFR calc Af Amer: >60 mL/min (ref >60)
GFR calc non Af Amer: >60 mL/min (ref >60)
Glucose, Bld: 80 mg/dL (ref 70–99)
Potassium: 4 mmol/L (ref 3.5–5.1)
Sodium: 139 mmol/L (ref 135–145)
Total Bilirubin: 0.8 mg/dL (ref 0.3–1.2)
Total Protein: 6.3 g/dL — ABNORMAL LOW (ref 6.5–8.1)

## 2019-11-12 LAB — LIPID PANEL
Cholesterol: 107 mg/dL (ref 0–200)
HDL: 35 mg/dL — ABNORMAL LOW (ref >40)
LDL Cholesterol: 61 mg/dL (ref 0–99)
Total CHOL/HDL Ratio: 3.1 RATIO
Triglycerides: 54 mg/dL (ref <150)
VLDL: 11 mg/dL (ref 0–40)

## 2019-11-12 LAB — LIPASE, BLOOD: Lipase: 153 U/L — ABNORMAL HIGH (ref 11–51)

## 2019-11-12 MED ORDER — FOSFOMYCIN TROMETHAMINE 3 G PO PACK
3.0000 g | PACK | Freq: Once | ORAL | Status: AC
  Administered 2019-11-12: 3 g via ORAL
  Filled 2019-11-12: qty 3

## 2019-11-12 MED ORDER — DOCUSATE SODIUM 100 MG PO CAPS
100.0000 mg | ORAL_CAPSULE | Freq: Every day | ORAL | Status: DC
  Administered 2019-11-12 – 2019-11-13 (×2): 100 mg via ORAL
  Filled 2019-11-12 (×2): qty 1

## 2019-11-12 NOTE — Progress Notes (Signed)
PROGRESS NOTE   [name] [name]  MPN:[id number] DOB: 11/19/1940 DOA: 11/10/2019 PCP: Erven Colla, DO   Chief Complaint  Patient presents with  . Abdominal Pain    Brief Admission History:  79 y.o. male, with medical history of AAA, asthma, COPD, GERD, CAD came to hospital with diffuse abdominal pain.  Patient says that pain started after he ate sandwich yesterday, after that he developed bloating and pressure in abdomen.  He denies nausea but had one episode of vomiting around 7 PM.  Patient has had diarrhea for past 1 week and stated that for past 2 days he had 3 episodes of watery bowel movement.  Denies any fever or chills.  Patient said that he was on prednisone, which was prescribed by his ophthalmologist.  It was stopped on 11/07/2019.  Patient had eye surgery recently and is currently on PROLENSA and Cosopt eyedrops.  Denies passing out.  He denies drinking alcohol Patient had cholecystectomy done in 2000  In the ED lab work showed lipase elevated to 5229  LFTs are normal  CT abdomen pelvis shows pancreatitis.  Assessment & Plan:   Active Problems:   Coronary atherosclerosis   COPD (chronic obstructive pulmonary disease) (HCC)   GERD (gastroesophageal reflux disease)   Insomnia   HTN (hypertension)   Pancreatitis   HOH (hard of hearing)   History of MI (myocardial infarction)  1. Acute idiopathic pancreatitis - Lipase levels have come down nicely and patient tolerated bowel rest well.  Will try clear liquid diet.  Continue pain and nausea medications as needed.  Planning MRI abdomen 6/29 to check for stone in duct.  MRI unavailable until Tues at this facility.  2. GERD - continue protonix for GI protection. 3. Leukocytosis - WBC unchanged, recheck in AM.  No S/s of infection found. Check portable CXR in AM.    4. HTN - BP better controlled, following.  5. COPD - stable, following.  6. CAD h/o MI - stable.   DVT prophylaxis:  Code Status:  Family Communication:    Disposition:   Status is: Inpatient  Remains inpatient appropriate because:Ongoing active pain requiring inpatient pain management, Ongoing diagnostic testing needed not appropriate for outpatient work up and Inpatient level of care appropriate due to severity of illness   Dispo: The patient is from: Home              Anticipated d/c is to: Home              Anticipated d/c date is: 2 days              Patient currently is not medically stable to d/c.  Consultants:     Procedures:     Antimicrobials:    Subjective: Pt says he still has abd pain but much less intense, no emesis, no diarrhea  Objective: Vitals:   11/12/19 0126 11/12/19 0607 11/12/19 0745 11/12/19 1455  BP:  119/64  120/79  Pulse:  96  84  Resp:  20  20  Temp:  98.4 F (36.9 C)  98.1 F (36.7 C)  TempSrc:  Oral  Oral  SpO2: 96% 95% 96% 97%  Weight:      Height:        Intake/Output Summary (Last 24 hours) at 11/12/2019 1615 Last data filed at 11/12/2019 0641 Gross per 24 hour  Intake 1249.72 ml  Output 700 ml  Net 549.72 ml   Filed Weights   11/10/19 2127 11/11/19 0810  Weight:  90.7 kg 90 kg    Examination:  General exam: Appears calm and comfortable  Respiratory system: Clear to auscultation. Respiratory effort normal. Cardiovascular system: S1 & S2 heard, RRR. No JVD, murmurs, rubs, gallops or clicks. No pedal edema. Gastrointestinal system: Abdomen is mildly distended, soft and  Mild epigastric tenderness. No organomegaly or masses felt. Normal bowel sounds heard. Central nervous system: Alert and oriented. No focal neurological deficits. Extremities: Symmetric 5 x 5 power. Skin: No rashes, lesions or ulcers Psychiatry: Judgement and insight appear normal. Mood & affect appropriate.   Data Reviewed: I have personally reviewed following labs and imaging studies  CBC: Recent Labs  Lab 11/10/19 2150 11/12/19 0350  WBC 15.4* 15.3*  HGB 15.3 13.1  HCT 48.7 43.6  MCV 89.0 92.2  PLT  248 295    Basic Metabolic Panel: Recent Labs  Lab 11/10/19 2150 11/12/19 0350  NA 140 139  K 4.2 4.0  CL 102 102  CO2 29 28  GLUCOSE 155* 80  BUN 14 15  CREATININE 1.08 1.00  CALCIUM 9.6 8.2*    GFR: Estimated Creatinine Clearance: 68.8 mL/min (by C-G formula based on SCr of 1 mg/dL).  Liver Function Tests: Recent Labs  Lab 11/10/19 2341 11/12/19 0350  AST 25 16  ALT 20 16  ALKPHOS 67 51  BILITOT 0.7 0.8  PROT 7.8 6.3*  ALBUMIN 4.3 3.3*    CBG: No results for input(s): GLUCAP in the last 168 hours.  Recent Results (from the past 240 hour(s))  SARS Coronavirus 2 by RT PCR (hospital order, performed in [facility] hospital lab) Nasopharyngeal Nasopharyngeal Swab     Status: None   Collection Time: 11/11/19  4:46 AM   Specimen: Nasopharyngeal Swab  Result Value Ref Range Status   SARS Coronavirus 2 NEGATIVE NEGATIVE Final    Comment: (NOTE) SARS-CoV-2 target nucleic acids are NOT DETECTED.  The SARS-CoV-2 RNA is generally detectable in upper and lower respiratory specimens during the acute phase of infection. The lowest concentration of SARS-CoV-2 viral copies this assay can detect is 250 copies / mL. A negative result does not preclude SARS-CoV-2 infection and should not be used as the sole basis for treatment or other patient management decisions.  A negative result may occur with improper specimen collection / handling, submission of specimen other than nasopharyngeal swab, presence of viral mutation(s) within the areas targeted by this assay, and inadequate number of viral copies (<250 copies / mL). A negative result must be combined with clinical observations, patient history, and epidemiological information.  Fact Sheet for Patients:   StrictlyIdeas.no  Fact Sheet for Healthcare Providers: BankingDealers.co.za  This test is not yet approved or  cleared by the [place] FDA and has been authorized  for detection and/or diagnosis of SARS-CoV-2 by FDA under an Emergency Use Authorization (EUA).  This EUA will remain in effect (meaning this test can be used) for the duration of the COVID-19 declaration under Section 564(b)(1) of the Act, 21 U.S.C. section 360bbb-3(b)(1), unless the authorization is terminated or revoked sooner.  Performed at [facility], [address] [place], [place] [address]      Radiology Studies: CT Abdomen Pelvis W Contrast  Result Date: 11/11/2019 CLINICAL DATA:  Epigastric abdominal pain EXAM: CT ABDOMEN AND PELVIS WITH CONTRAST TECHNIQUE: Multidetector CT imaging of the abdomen and pelvis was performed using the standard protocol following bolus administration of intravenous contrast. CONTRAST:  12mL OMNIPAQUE IOHEXOL 300 MG/ML  SOLN COMPARISON:  06/30/2017 FINDINGS: LOWER CHEST:  Normal. HEPATOBILIARY: Normal hepatic contours. No intra- or extrahepatic biliary dilatation. Status post cholecystectomy. PANCREAS: There is inflammatory stranding surrounding the pancreas. No peripancreatic fluid collection. No pancreatic necrosis. Fluid and inflammation track into the retroperitoneum/anterior pararenal space along the duodenum. SPLEEN: Normal. ADRENALS/URINARY TRACT: The adrenal glands are normal. No hydronephrosis, nephroureterolithiasis or solid renal mass. The urinary bladder is normal for degree of distention STOMACH/BOWEL: There is no hiatal hernia. Normal duodenal course and caliber. No small bowel dilatation or inflammation. Rectosigmoid diverticulosis without acute inflammation. The appendix is not visualized. No right lower quadrant inflammation or free fluid. VASCULAR/LYMPHATIC: 4 cm abdominal aortic aneurysm, unchanged. Moderate volume noncalcified atherosclerotic plaque. REPRODUCTIVE: Enlarged prostate measures 5.7 cm in transverse dimension. MUSCULOSKELETAL. Multilevel degenerative disc disease. Grade 1 retrolisthesis at L3-4. L5-S1 PLIF. OTHER: None. IMPRESSION:  1. Acute pancreatitis without peripancreatic fluid collection or pancreatic necrosis. 2. Rectosigmoid diverticulosis without acute inflammation. 3. Recommend followup by ultrasound in 1 year. This recommendation follows ACR consensus guidelines: White Paper of the ACR Incidental Findings Committee II on Vascular Findings. J Am Coll Radiol 2013; 10:789-794. Aortic aneurysm NOS (ICD10-I71.9) Aortic atherosclerosis (ICD10-I70.0). Electronically Signed   By: Ulyses Jarred M.D.   On: 11/11/2019 02:47     Scheduled Meds: . albuterol  2.5 mg Nebulization BID  . dorzolamide-timolol  1 drop Left Eye BID  . enoxaparin (LOVENOX) injection  40 mg Subcutaneous Q24H  . ketorolac  1 drop Left Eye Daily   Continuous Infusions: . sodium chloride 100 mL/hr at 11/12/19 0901     LOS: 1 day   Time spent: 19 mins    Noely Kuhnle Wynetta Emery, MD How to contact the [facility] Attending or Consulting provider [place] or covering provider during after hours [place], for this patient?  1. Check the care team in [facility] and look for a) attending/consulting TRH provider listed and b) the [facility] team listed 2. Log into [email or web address] and use [place]'s universal password to access. If you do not have the password, please contact the hospital operator. 3. Locate the [facility] provider you are looking for under [facility] and page to a number that you can be directly reached. 4. If you still have difficulty reaching the provider, please page the [facility] (Director on Call) for the Hospitalists listed on amion for assistance.  11/12/2019, 4:15 PM

## 2019-11-12 NOTE — Plan of Care (Signed)

## 2019-11-13 ENCOUNTER — Inpatient Hospital Stay (HOSPITAL_COMMUNITY): Payer: Medicare Other

## 2019-11-13 LAB — COMPREHENSIVE METABOLIC PANEL
ALT: 21 U/L (ref 0–44)
AST: 22 U/L (ref 15–41)
Albumin: 3 g/dL — ABNORMAL LOW (ref 3.5–5.0)
Alkaline Phosphatase: 59 U/L (ref 38–126)
Anion gap: 8 (ref 5–15)
BUN: 11 mg/dL (ref 8–23)
CO2: 28 mmol/L (ref 22–32)
Calcium: 8.2 mg/dL — ABNORMAL LOW (ref 8.9–10.3)
Chloride: 102 mmol/L (ref 98–111)
Creatinine, Ser: 0.78 mg/dL (ref 0.61–1.24)
GFR calc Af Amer: >60 mL/min (ref >60)
GFR calc non Af Amer: >60 mL/min (ref >60)
Glucose, Bld: 95 mg/dL (ref 70–99)
Potassium: 4 mmol/L (ref 3.5–5.1)
Sodium: 138 mmol/L (ref 135–145)
Total Bilirubin: 1.3 mg/dL — ABNORMAL HIGH (ref 0.3–1.2)
Total Protein: 6.2 g/dL — ABNORMAL LOW (ref 6.5–8.1)

## 2019-11-13 LAB — CBC WITH DIFFERENTIAL/PLATELET
Abs Immature Granulocytes: 0.07 10*3/uL (ref 0.00–0.07)
Basophils Absolute: 0 10*3/uL (ref 0.0–0.1)
Basophils Relative: 0 %
Eosinophils Absolute: 0.2 10*3/uL (ref 0.0–0.5)
Eosinophils Relative: 1 %
HCT: 40.8 % (ref 39.0–52.0)
Hemoglobin: 12.5 g/dL — ABNORMAL LOW (ref 13.0–17.0)
Immature Granulocytes: 1 %
Lymphocytes Relative: 7 %
Lymphs Abs: 1.1 10*3/uL (ref 0.7–4.0)
MCH: 28.2 pg (ref 26.0–34.0)
MCHC: 30.6 g/dL (ref 30.0–36.0)
MCV: 92.1 fL (ref 80.0–100.0)
Monocytes Absolute: 1.2 10*3/uL — ABNORMAL HIGH (ref 0.1–1.0)
Monocytes Relative: 8 %
Neutro Abs: 12.3 10*3/uL — ABNORMAL HIGH (ref 1.7–7.7)
Neutrophils Relative %: 83 %
Platelets: 173 10*3/uL (ref 150–400)
RBC: 4.43 MIL/uL (ref 4.22–5.81)
RDW: 13.8 % (ref 11.5–15.5)
WBC: 14.9 10*3/uL — ABNORMAL HIGH (ref 4.0–10.5)
nRBC: 0 % (ref 0.0–0.2)

## 2019-11-13 LAB — LIPASE, BLOOD: Lipase: 43 U/L (ref 11–51)

## 2019-11-13 MED ORDER — GADOBUTROL 1 MMOL/ML IV SOLN
7.0000 mL | Freq: Once | INTRAVENOUS | Status: AC | PRN
  Administered 2019-11-13: 7 mL via INTRAVENOUS

## 2019-11-13 NOTE — Plan of Care (Signed)
  Problem: Education: Goal: Knowledge of General Education information will improve Description: Including pain rating scale, medication(s)/side effects and non-pharmacologic comfort measures 11/13/2019 0215 by Baldemar Friday, RN Outcome: Progressing 11/13/2019 0209 by Baldemar Friday, RN Outcome: Progressing   Problem: Health Behavior/Discharge Planning: Goal: Ability to manage health-related needs will improve 11/13/2019 0215 by Baldemar Friday, RN Outcome: Progressing 11/13/2019 0209 by Baldemar Friday, RN Outcome: Progressing   Problem: Clinical Measurements: Goal: Ability to maintain clinical measurements within normal limits will improve 11/13/2019 0215 by Baldemar Friday, RN Outcome: Progressing 11/13/2019 0209 by Baldemar Friday, RN Outcome: Progressing Goal: Will remain free from infection 11/13/2019 0215 by Baldemar Friday, RN Outcome: Progressing 11/13/2019 0209 by Baldemar Friday, RN Outcome: Progressing Goal: Diagnostic test results will improve 11/13/2019 0215 by Baldemar Friday, RN Outcome: Progressing 11/13/2019 0209 by Baldemar Friday, RN Outcome: Progressing Goal: Respiratory complications will improve 11/13/2019 0215 by Baldemar Friday, RN Outcome: Progressing 11/13/2019 0209 by Baldemar Friday, RN Outcome: Progressing Goal: Cardiovascular complication will be avoided 11/13/2019 0215 by Baldemar Friday, RN Outcome: Progressing 11/13/2019 0209 by Baldemar Friday, RN Outcome: Progressing   Problem: Activity: Goal: Risk for activity intolerance will decrease 11/13/2019 0215 by Baldemar Friday, RN Outcome: Progressing 11/13/2019 0209 by Baldemar Friday, RN Outcome: Progressing   Problem: Nutrition: Goal: Adequate nutrition will be maintained 11/13/2019 0215 by Baldemar Friday, RN Outcome: Progressing 11/13/2019 0209 by Baldemar Friday, RN Outcome: Progressing   Problem: Coping: Goal: Level of anxiety will decrease 11/13/2019 0215 by Baldemar Friday, RN Outcome:  Progressing 11/13/2019 0209 by Baldemar Friday, RN Outcome: Progressing   Problem: Elimination: Goal: Will not experience complications related to bowel motility 11/13/2019 0215 by Baldemar Friday, RN Outcome: Progressing 11/13/2019 0209 by Baldemar Friday, RN Outcome: Progressing Goal: Will not experience complications related to urinary retention 11/13/2019 0215 by Baldemar Friday, RN Outcome: Progressing 11/13/2019 0209 by Baldemar Friday, RN Outcome: Progressing   Problem: Pain Managment: Goal: General experience of comfort will improve 11/13/2019 0215 by Baldemar Friday, RN Outcome: Progressing 11/13/2019 0209 by Baldemar Friday, RN Outcome: Progressing   Problem: Safety: Goal: Ability to remain free from injury will improve 11/13/2019 0215 by Baldemar Friday, RN Outcome: Progressing 11/13/2019 0209 by Baldemar Friday, RN Outcome: Progressing   Problem: Skin Integrity: Goal: Risk for impaired skin integrity will decrease 11/13/2019 0215 by Baldemar Friday, RN Outcome: Progressing 11/13/2019 0209 by Baldemar Friday, RN Outcome: Progressing   Problem: Education: Goal: Knowledge of Pancreatitis treatment and prevention will improve 11/13/2019 0215 by Baldemar Friday, RN Outcome: Progressing 11/13/2019 0209 by Baldemar Friday, RN Outcome: Progressing   Problem: Health Behavior/Discharge Planning: Goal: Ability to formulate a plan to maintain an alcohol-free life will improve 11/13/2019 0215 by Baldemar Friday, RN Outcome: Progressing 11/13/2019 0209 by Baldemar Friday, RN Outcome: Progressing   Problem: Nutritional: Goal: Ability to achieve adequate nutritional intake will improve 11/13/2019 0215 by Baldemar Friday, RN Outcome: Progressing 11/13/2019 0209 by Baldemar Friday, RN Outcome: Progressing   Problem: Clinical Measurements: Goal: Complications related to the disease process, condition or treatment will be avoided or minimized 11/13/2019 0215 by Baldemar Friday, RN Outcome:  Progressing 11/13/2019 0209 by Baldemar Friday, RN Outcome: Progressing

## 2019-11-13 NOTE — Plan of Care (Signed)

## 2019-11-13 NOTE — Progress Notes (Signed)
PROGRESS NOTE   [name] [name]  EYC:[id number] DOB: Feb 10, 1941 DOA: 11/10/2019 PCP: Erven Colla, DO   Chief Complaint  Patient presents with  . Abdominal Pain    Brief Admission History:  79 y.o. male, with medical history of AAA, asthma, COPD, GERD, CAD came to hospital with diffuse abdominal pain.  Patient says that pain started after he ate sandwich yesterday, after that he developed bloating and pressure in abdomen.  He denies nausea but had one episode of vomiting around 7 PM.  Patient has had diarrhea for past 1 week and stated that for past 2 days he had 3 episodes of watery bowel movement.  Denies any fever or chills.  Patient said that he was on prednisone, which was prescribed by his ophthalmologist.  It was stopped on 11/07/2019.  Patient had eye surgery recently and is currently on PROLENSA and Cosopt eyedrops.  Denies passing out.  He denies drinking alcohol Patient had cholecystectomy done in 2000  In the ED lab work showed lipase elevated to 5229  LFTs are normal  CT abdomen pelvis shows pancreatitis.  Assessment & Plan:   Active Problems:   Coronary atherosclerosis   COPD (chronic obstructive pulmonary disease) (HCC)   GERD (gastroesophageal reflux disease)   Insomnia   HTN (hypertension)   Pancreatitis   HOH (hard of hearing)   History of MI (myocardial infarction)  1. Acute idiopathic pancreatitis - Lipase levels have come down nicely and patient tolerated bowel rest well.  He is having pain with clear liquid diet and unable to advance diet. He is having a lot of abdominal bloating and pain.  He is still requiring IV pain medication.  MRI abdomen today.  I spoke with MRI and they can do with rods in spine.  Planning MRI abdomen 6/29 to check for stone in duct.  MRI unavailable until Tues at this facility.  2. GERD - continue protonix for GI protection. 3. Leukocytosis - WBC slightly improved, recheck in AM.  No s/s of infection found.     4. HTN - BP better  controlled, following.  5. COPD - stable, following.  6. CAD h/o MI - stable.   DVT prophylaxis: enoxaparin Code Status: full Family Communication: daughter telephone update 6/29 Disposition:   Status is: Inpatient  Remains inpatient appropriate because:Ongoing active pain requiring inpatient pain management, Ongoing diagnostic testing needed not appropriate for outpatient work up and Inpatient level of care appropriate due to severity of illness. MRI abdomen today, not able to advance diet today due to abdominal pain, still requiring IV pain medication  Dispo: The patient is from: Home              Anticipated d/c is to: Home              Anticipated d/c date is: 2 days              Patient currently is not medically stable to d/c.  Consultants:     Procedures:     Antimicrobials:    Subjective: Pt says he a lot of gas pain and flatus, having abdominal pain with clears diet.  No emesis. No BM yet.   Objective: Vitals:   11/12/19 1917 11/12/19 2026 11/13/19 0445 11/13/19 0634  BP:  112/78 114/63   Pulse:  91 (!) 102   Resp:  20 20   Temp:  98.7 F (37.1 C) 98.9 F (37.2 C)   TempSrc:  Oral Oral   SpO2: 93%  96% 96% 94%  Weight:      Height:        Intake/Output Summary (Last 24 hours) at 11/13/2019 1146 Last data filed at 11/13/2019 0500 Gross per 24 hour  Intake 2987.42 ml  Output 700 ml  Net 2287.42 ml   Filed Weights   11/10/19 2127 11/11/19 0810  Weight: 90.7 kg 90 kg    Examination:  General exam: Appears calm and comfortable  Respiratory system: Clear to auscultation. Respiratory effort normal. Cardiovascular system: S1 & S2 heard, RRR. No JVD, murmurs, rubs, gallops or clicks. No pedal edema. Gastrointestinal system: Abdomen is more distended, soft and persistent mild epigastric tenderness. No organomegaly or masses felt. Normal bowel sounds heard. Central nervous system: Alert and oriented. No focal neurological deficits. Extremities: Symmetric 5  x 5 power. Skin: No rashes, lesions or ulcers Psychiatry: Judgement and insight appear normal. Mood & affect appropriate.   Data Reviewed: I have personally reviewed following labs and imaging studies  CBC: Recent Labs  Lab 11/10/19 2150 11/12/19 0350 11/13/19 0420  WBC 15.4* 15.3* 14.9*  NEUTROABS  --   --  12.3*  HGB 15.3 13.1 12.5*  HCT 48.7 43.6 40.8  MCV 89.0 92.2 92.1  PLT 248 201 850    Basic Metabolic Panel: Recent Labs  Lab 11/10/19 2150 11/12/19 0350 11/13/19 0420  NA 140 139 138  K 4.2 4.0 4.0  CL 102 102 102  CO2 29 28 28   GLUCOSE 155* 80 95  BUN 14 15 11   CREATININE 1.08 1.00 0.78  CALCIUM 9.6 8.2* 8.2*    GFR: Estimated Creatinine Clearance: 86 mL/min (by C-G formula based on SCr of 0.78 mg/dL).  Liver Function Tests: Recent Labs  Lab 11/10/19 2341 11/12/19 0350 11/13/19 0420  AST 25 16 22   ALT 20 16 21   ALKPHOS 67 51 59  BILITOT 0.7 0.8 1.3*  PROT 7.8 6.3* 6.2*  ALBUMIN 4.3 3.3* 3.0*    CBG: No results for input(s): GLUCAP in the last 168 hours.  Recent Results (from the past 240 hour(s))  SARS Coronavirus 2 by RT PCR (hospital order, performed in [facility] hospital lab) Nasopharyngeal Nasopharyngeal Swab     Status: None   Collection Time: 11/11/19  4:46 AM   Specimen: Nasopharyngeal Swab  Result Value Ref Range Status   SARS Coronavirus 2 NEGATIVE NEGATIVE Final    Comment: (NOTE) SARS-CoV-2 target nucleic acids are NOT DETECTED.  The SARS-CoV-2 RNA is generally detectable in upper and lower respiratory specimens during the acute phase of infection. The lowest concentration of SARS-CoV-2 viral copies this assay can detect is 250 copies / mL. A negative result does not preclude SARS-CoV-2 infection and should not be used as the sole basis for treatment or other patient management decisions.  A negative result may occur with improper specimen collection / handling, submission of specimen other than nasopharyngeal swab, presence  of viral mutation(s) within the areas targeted by this assay, and inadequate number of viral copies (<250 copies / mL). A negative result must be combined with clinical observations, patient history, and epidemiological information.  Fact Sheet for Patients:   StrictlyIdeas.no  Fact Sheet for Healthcare Providers: BankingDealers.co.za  This test is not yet approved or  cleared by the [place] FDA and has been authorized for detection and/or diagnosis of SARS-CoV-2 by FDA under an Emergency Use Authorization (EUA).  This EUA will remain in effect (meaning this test can be used) for the duration of the COVID-19 declaration under  Section 564(b)(1) of the Act, 21 U.S.C. section 360bbb-3(b)(1), unless the authorization is terminated or revoked sooner.  Performed at [facility], [address] [place], [place] [address]      Radiology Studies: DG CHEST PORT 1 VIEW  Result Date: 11/13/2019 CLINICAL DATA:  Leukocytosis.  Pancreatitis EXAM: PORTABLE CHEST 1 VIEW COMPARISON:  08/06/2019 FINDINGS: Mild atelectasis at the right base. There is no edema, consolidation, effusion, or pneumothorax. Normal heart size. Aortic tortuosity. IMPRESSION: Mild atelectasis. Electronically Signed   By: Monte Fantasia M.D.   On: 11/13/2019 07:26   DG Abd Portable 2V  Result Date: 11/13/2019 CLINICAL DATA:  Abdominal pain and pancreatitis EXAM: PORTABLE ABDOMEN - 2 VIEW COMPARISON:  Abdominal CT 2 days ago FINDINGS: Cholecystectomy and PLIF hardware. Normal bowel gas pattern. Mild atelectasis at the bases. No concerning mass effect or gas collection. IMPRESSION: Normal bowel gas pattern. Electronically Signed   By: Monte Fantasia M.D.   On: 11/13/2019 07:28     Scheduled Meds: . albuterol  2.5 mg Nebulization BID  . docusate sodium  100 mg Oral QHS  . dorzolamide-timolol  1 drop Left Eye BID  . enoxaparin (LOVENOX) injection  40 mg Subcutaneous Q24H  .  ketorolac  1 drop Left Eye Daily   Continuous Infusions: . sodium chloride 75 mL/hr at 11/13/19 0500     LOS: 2 days   Time spent: 16 mins    Dyllan Kats Wynetta Emery, MD How to contact the [facility] Attending or Consulting provider [place] or covering provider during after hours [place], for this patient?  1. Check the care team in [facility] and look for a) attending/consulting TRH provider listed and b) the [facility] team listed 2. Log into [email or web address] and use [place]'s universal password to access. If you do not have the password, please contact the hospital operator. 3. Locate the [facility] provider you are looking for under [facility] and page to a number that you can be directly reached. 4. If you still have difficulty reaching the provider, please page the [facility] (Director on Call) for the Hospitalists listed on amion for assistance.  11/13/2019, 11:46 AM

## 2019-11-14 LAB — COMPREHENSIVE METABOLIC PANEL
ALT: 23 U/L (ref 0–44)
AST: 24 U/L (ref 15–41)
Albumin: 2.8 g/dL — ABNORMAL LOW (ref 3.5–5.0)
Alkaline Phosphatase: 61 U/L (ref 38–126)
Anion gap: 6 (ref 5–15)
BUN: 11 mg/dL (ref 8–23)
CO2: 32 mmol/L (ref 22–32)
Calcium: 8.2 mg/dL — ABNORMAL LOW (ref 8.9–10.3)
Chloride: 100 mmol/L (ref 98–111)
Creatinine, Ser: 0.7 mg/dL (ref 0.61–1.24)
GFR calc Af Amer: >60 mL/min (ref >60)
GFR calc non Af Amer: >60 mL/min (ref >60)
Glucose, Bld: 106 mg/dL — ABNORMAL HIGH (ref 70–99)
Potassium: 3.7 mmol/L (ref 3.5–5.1)
Sodium: 138 mmol/L (ref 135–145)
Total Bilirubin: 1 mg/dL (ref 0.3–1.2)
Total Protein: 6.1 g/dL — ABNORMAL LOW (ref 6.5–8.1)

## 2019-11-14 LAB — CBC WITH DIFFERENTIAL/PLATELET
Abs Immature Granulocytes: 0.03 10*3/uL (ref 0.00–0.07)
Basophils Absolute: 0 10*3/uL (ref 0.0–0.1)
Basophils Relative: 0 %
Eosinophils Absolute: 0.3 10*3/uL (ref 0.0–0.5)
Eosinophils Relative: 3 %
HCT: 37.8 % — ABNORMAL LOW (ref 39.0–52.0)
Hemoglobin: 11.8 g/dL — ABNORMAL LOW (ref 13.0–17.0)
Immature Granulocytes: 0 %
Lymphocytes Relative: 11 %
Lymphs Abs: 1.3 10*3/uL (ref 0.7–4.0)
MCH: 28.4 pg (ref 26.0–34.0)
MCHC: 31.2 g/dL (ref 30.0–36.0)
MCV: 90.9 fL (ref 80.0–100.0)
Monocytes Absolute: 0.8 10*3/uL (ref 0.1–1.0)
Monocytes Relative: 7 %
Neutro Abs: 9 10*3/uL — ABNORMAL HIGH (ref 1.7–7.7)
Neutrophils Relative %: 79 %
Platelets: 166 10*3/uL (ref 150–400)
RBC: 4.16 MIL/uL — ABNORMAL LOW (ref 4.22–5.81)
RDW: 13.6 % (ref 11.5–15.5)
WBC: 11.5 10*3/uL — ABNORMAL HIGH (ref 4.0–10.5)
nRBC: 0 % (ref 0.0–0.2)

## 2019-11-14 LAB — LIPASE, BLOOD: Lipase: 41 U/L (ref 11–51)

## 2019-11-14 MED ORDER — HYDROMORPHONE HCL 2 MG PO TABS
1.0000 mg | ORAL_TABLET | ORAL | Status: DC | PRN
  Administered 2019-11-14 – 2019-11-16 (×10): 1 mg via ORAL
  Filled 2019-11-14 (×10): qty 1

## 2019-11-14 MED ORDER — DOCUSATE SODIUM 100 MG PO CAPS
100.0000 mg | ORAL_CAPSULE | Freq: Two times a day (BID) | ORAL | Status: DC
  Administered 2019-11-14 – 2019-11-16 (×4): 100 mg via ORAL
  Filled 2019-11-14 (×5): qty 1

## 2019-11-14 NOTE — Progress Notes (Signed)
PROGRESS NOTE    [name] [name]  NAT:[id number] DOB: Jan 23, 1941 DOA: 11/10/2019 PCP: Erven Colla, DO   Brief Narrative:  79 y.o.male,with medical history of AAA, asthma, COPD, GERD, CAD came to hospital with diffuse abdominal pain. Patient says that pain started after he ate sandwich yesterday, after that he developed bloating and pressure in abdomen. He denies nausea but had one episode of vomiting around 7 PM. Patient has had diarrhea for past 1 week and stated that for past 2 days he had 3 episodes of watery bowel movement. Denies any fever or chills. Patient said that he was on prednisone,which was prescribed by his ophthalmologist. It was stopped on 11/07/2019. Patient had eye surgery recently and is currently on PROLENSA and Cosopt eyedrops.  Denies passing out.  He denies drinking alcohol Patient had cholecystectomy done in 2000  In the ED lab work showed lipase elevated to 5229  LFTs are normal  CT abdomen pelvis shows pancreatitis.  Assessment & Plan:   Active Problems:   Coronary atherosclerosis   COPD (chronic obstructive pulmonary disease) (HCC)   GERD (gastroesophageal reflux disease)   Insomnia   HTN (hypertension)   Pancreatitis   HOH (hard of hearing)   History of MI (myocardial infarction)   1. Acute idiopathic pancreatitis - Lipase levels have come down nicely and patient tolerated bowel rest well.  He is having pain with clear liquid diet and unable to advance diet. He is having a lot of abdominal bloating and pain.  He is still requiring IV pain medication.   MRI abdomen without choledocholithiasis.  Plan for further ambulation. 2. GERD - continue protonix for GI protection. 3. Leukocytosis - WBC slightly improved, recheck in AM.  No s/s of infection found.     4. HTN - BP better controlled, following.  5. COPD - stable, following.  6. CAD h/o MI - stable.  7. Infrarenal abdominal aortic aneurysm.  Follow-up in 1 year.  DVT prophylaxis: enoxaparin  Code Status: full Family Communication:  Son at bedside. Disposition:   Status is: Inpatient  Remains inpatient appropriate because:Ongoing active pain requiring inpatient pain management, Ongoing diagnostic testing needed not appropriate for outpatient work up and Inpatient level of care appropriate due to severity of illness.  Unable to advance diet due to ongoing pain.  Dispo: The patient is from: Home  Anticipated d/c is to: Home  Anticipated d/c date is: 2 days  Patient currently is not medically stable to d/c.  Consultants:   None  Procedures:   See below  Antimicrobials:   None   Subjective: Patient seen and evaluated today with no new acute complaints or concerns. No acute concerns or events noted overnight.  He continues to have some abdominal pain that seems to be improving.  He is still quite bloated as well.  He is able to tolerate small portions of his liquid diet.  Objective: Vitals:   11/13/19 2052 11/14/19 0001 11/14/19 0534 11/14/19 0759  BP: 123/81  117/65   Pulse: 86  93   Resp: 20  20   Temp: 98.4 F (36.9 C)  98.1 F (36.7 C)   TempSrc: Oral  Oral   SpO2: 95% 95% 97% 95%  Weight:      Height:        Intake/Output Summary (Last 24 hours) at 11/14/2019 1325 Last data filed at 11/14/2019 0400 Gross per 24 hour  Intake 1805.55 ml  Output 1450 ml  Net 355.55 ml   [employer]  11/10/19 2127 11/11/19 0810  Weight: 90.7 kg 90 kg    Examination:  General exam: Appears calm and comfortable  Respiratory system: Clear to auscultation. Respiratory effort normal. Cardiovascular system: S1 & S2 heard, RRR. No JVD, murmurs, rubs, gallops or clicks. No pedal edema. Gastrointestinal system: Abdomen is minimally distended, soft and nontender. No organomegaly or masses felt. Normal bowel sounds heard. Central nervous system: Alert and oriented. No focal neurological deficits. Extremities: Symmetric 5 x 5 power.  Skin: No rashes, lesions or ulcers Psychiatry: Judgement and insight appear normal. Mood & affect appropriate.     Data Reviewed: I have personally reviewed following labs and imaging studies  CBC: Recent Labs  Lab 11/10/19 2150 11/12/19 0350 11/13/19 0420 11/14/19 0435  WBC 15.4* 15.3* 14.9* 11.5*  NEUTROABS  --   --  12.3* 9.0*  HGB 15.3 13.1 12.5* 11.8*  HCT 48.7 43.6 40.8 37.8*  MCV 89.0 92.2 92.1 90.9  PLT 248 201 173 330   Basic Metabolic Panel: Recent Labs  Lab 11/10/19 2150 11/12/19 0350 11/13/19 0420 11/14/19 0435  NA 140 139 138 138  K 4.2 4.0 4.0 3.7  CL 102 102 102 100  CO2 29 28 28  32  GLUCOSE 155* 80 95 106*  BUN 14 15 11 11   CREATININE 1.08 1.00 0.78 0.70  CALCIUM 9.6 8.2* 8.2* 8.2*   GFR: Estimated Creatinine Clearance: 86 mL/min (by C-G formula based on SCr of 0.7 mg/dL). Liver Function Tests: Recent Labs  Lab 11/10/19 2341 11/12/19 0350 11/13/19 0420 11/14/19 0435  AST 25 16 22 24   ALT 20 16 21 23   ALKPHOS 67 51 59 61  BILITOT 0.7 0.8 1.3* 1.0  PROT 7.8 6.3* 6.2* 6.1*  ALBUMIN 4.3 3.3* 3.0* 2.8*   Recent Labs  Lab 11/10/19 2150 11/12/19 0350 11/13/19 0420 11/14/19 0435  LIPASE 5,229* 153* 43 41   No results for input(s): AMMONIA in the last 168 hours. Coagulation Profile: No results for input(s): INR, PROTIME in the last 168 hours. Cardiac Enzymes: No results for input(s): CKTOTAL, CKMB, CKMBINDEX, TROPONINI in the last 168 hours. BNP (last 3 results) No results for input(s): PROBNP in the last 8760 hours. HbA1C: No results for input(s): HGBA1C in the last 72 hours. CBG: No results for input(s): GLUCAP in the last 168 hours. Lipid Profile: Recent Labs    11/12/19 0350  CHOL 107  HDL 35*  LDLCALC 61  TRIG 54  CHOLHDL 3.1   Thyroid Function Tests: No results for input(s): TSH, T4TOTAL, FREET4, T3FREE, THYROIDAB in the last 72 hours. Anemia Panel: No results for input(s): VITAMINB12, FOLATE, FERRITIN, TIBC, IRON,  RETICCTPCT in the last 72 hours. Sepsis Labs: No results for input(s): PROCALCITON, LATICACIDVEN in the last 168 hours.  Recent Results (from the past 240 hour(s))  SARS Coronavirus 2 by RT PCR (hospital order, performed in [facility] hospital lab) Nasopharyngeal Nasopharyngeal Swab     Status: None   Collection Time: 11/11/19  4:46 AM   Specimen: Nasopharyngeal Swab  Result Value Ref Range Status   SARS Coronavirus 2 NEGATIVE NEGATIVE Final    Comment: (NOTE) SARS-CoV-2 target nucleic acids are NOT DETECTED.  The SARS-CoV-2 RNA is generally detectable in upper and lower respiratory specimens during the acute phase of infection. The lowest concentration of SARS-CoV-2 viral copies this assay can detect is 250 copies / mL. A negative result does not preclude SARS-CoV-2 infection and should not be used as the sole basis for treatment or other patient management decisions.  A negative result may occur with improper specimen collection / handling, submission of specimen other than nasopharyngeal swab, presence of viral mutation(s) within the areas targeted by this assay, and inadequate number of viral copies (<250 copies / mL). A negative result must be combined with clinical observations, patient history, and epidemiological information.  Fact Sheet for Patients:   StrictlyIdeas.no  Fact Sheet for Healthcare Providers: BankingDealers.co.za  This test is not yet approved or  cleared by the [place] FDA and has been authorized for detection and/or diagnosis of SARS-CoV-2 by FDA under an Emergency Use Authorization (EUA).  This EUA will remain in effect (meaning this test can be used) for the duration of the COVID-19 declaration under Section 564(b)(1) of the Act, 21 U.S.C. section 360bbb-3(b)(1), unless the authorization is terminated or revoked sooner.  Performed at [facility], [address], [place], [place] [address]           Radiology Studies: MR 3D Recon At Scanner  Result Date: 11/13/2019 CLINICAL DATA:  Diffuse abdominal pain for 1 day. Pancreatitis suspected. EXAM: MRI ABDOMEN WITHOUT AND WITH CONTRAST (INCLUDING MRCP) TECHNIQUE: Multiplanar multisequence MR imaging of the abdomen was performed both before and after the administration of intravenous contrast. Heavily T2-weighted images of the biliary and pancreatic ducts were obtained, and three-dimensional MRCP images were rendered by post processing. CONTRAST:  57mL GADAVIST GADOBUTROL 1 MMOL/ML IV SOLN COMPARISON:  CT scan 11/11/2019 FINDINGS: This exam is markedly motion degraded. Some series are nondiagnostic. lower chest: Asymmetric elevation left hemidiaphragm Hepatobiliary: The no gross abnormality within the liver parenchyma although small or subtle lesions could be obscured by artifact. Mild intrahepatic biliary duct prominence. Gallbladder surgically absent. Common duct in the hepatoduodenal ligament is mildly attenuated although no mass lesion or lymphadenopathy is evident on this degraded study. Common bile duct in the head of pancreas measures 7-8 mm diameter with central flow artifact noted on axial T2 imaging towards the ampulla. MRCP imaging shows no definite choledocholithiasis although assessment is substantially degraded by motion artifact. Pancreas: No dilatation of the main duct. Peripancreatic edema evident, similar to prior CT. Pancreatic parenchyma enhances throughout although fine detail is obscured by motion. Spleen:  No splenomegaly. No focal mass lesion. Adrenals/Urinary Tract: No adrenal nodule or mass. No hydronephrosis. No gross renal mass lesion. Probable 12 mm cyst upper pole right kidney. Assessment of kidneys degraded by motion artifact. Stomach/Bowel: Stomach is unremarkable. No gastric wall thickening. No evidence of outlet obstruction. Duodenum is normally positioned as is the ligament of Treitz. No small bowel or colonic dilatation  within the visualized abdomen. Vascular/Lymphatic: Infrarenal abdominal aorta measures up to 4.2 cm diameter. No evidence for abdominal lymphadenopathy. Other:  No intraperitoneal free fluid. Musculoskeletal: Susceptibility artifact lower lumbar spine secondary to fusion hardware. IMPRESSION: 1. Markedly motion degraded study. Some series are nondiagnostic. 2. Peripancreatic edema consistent with pancreatitis. No dilatation of the main duct. 3. No definite choledocholithiasis although assessment is substantially degraded by motion artifact. 4. Infrarenal abdominal aortic aneurysm measuring up to 4.2 cm diameter. Recommend followup by ultrasound in 1 year. This recommendation follows ACR consensus guidelines: White Paper of the ACR Incidental Findings Committee II on Vascular Findings. J Am Coll Radiol 2013; 10:789-794. Aortic aneurysm NOS (ICD10-I71.9) Electronically Signed   By: Misty Stanley M.D.   On: 11/13/2019 14:32   DG CHEST PORT 1 VIEW  Result Date: 11/13/2019 CLINICAL DATA:  Leukocytosis.  Pancreatitis EXAM: PORTABLE CHEST 1 VIEW COMPARISON:  08/06/2019 FINDINGS: Mild atelectasis at the right  base. There is no edema, consolidation, effusion, or pneumothorax. Normal heart size. Aortic tortuosity. IMPRESSION: Mild atelectasis. Electronically Signed   By: Monte Fantasia M.D.   On: 11/13/2019 07:26   DG Abd Portable 2V  Result Date: 11/13/2019 CLINICAL DATA:  Abdominal pain and pancreatitis EXAM: PORTABLE ABDOMEN - 2 VIEW COMPARISON:  Abdominal CT 2 days ago FINDINGS: Cholecystectomy and PLIF hardware. Normal bowel gas pattern. Mild atelectasis at the bases. No concerning mass effect or gas collection. IMPRESSION: Normal bowel gas pattern. Electronically Signed   By: Monte Fantasia M.D.   On: 11/13/2019 07:28   MR ABDOMEN MRCP W WO CONTAST  Result Date: 11/13/2019 CLINICAL DATA:  Diffuse abdominal pain for 1 day. Pancreatitis suspected. EXAM: MRI ABDOMEN WITHOUT AND WITH CONTRAST (INCLUDING MRCP)  TECHNIQUE: Multiplanar multisequence MR imaging of the abdomen was performed both before and after the administration of intravenous contrast. Heavily T2-weighted images of the biliary and pancreatic ducts were obtained, and three-dimensional MRCP images were rendered by post processing. CONTRAST:  20mL GADAVIST GADOBUTROL 1 MMOL/ML IV SOLN COMPARISON:  CT scan 11/11/2019 FINDINGS: This exam is markedly motion degraded. Some series are nondiagnostic. lower chest: Asymmetric elevation left hemidiaphragm Hepatobiliary: The no gross abnormality within the liver parenchyma although small or subtle lesions could be obscured by artifact. Mild intrahepatic biliary duct prominence. Gallbladder surgically absent. Common duct in the hepatoduodenal ligament is mildly attenuated although no mass lesion or lymphadenopathy is evident on this degraded study. Common bile duct in the head of pancreas measures 7-8 mm diameter with central flow artifact noted on axial T2 imaging towards the ampulla. MRCP imaging shows no definite choledocholithiasis although assessment is substantially degraded by motion artifact. Pancreas: No dilatation of the main duct. Peripancreatic edema evident, similar to prior CT. Pancreatic parenchyma enhances throughout although fine detail is obscured by motion. Spleen:  No splenomegaly. No focal mass lesion. Adrenals/Urinary Tract: No adrenal nodule or mass. No hydronephrosis. No gross renal mass lesion. Probable 12 mm cyst upper pole right kidney. Assessment of kidneys degraded by motion artifact. Stomach/Bowel: Stomach is unremarkable. No gastric wall thickening. No evidence of outlet obstruction. Duodenum is normally positioned as is the ligament of Treitz. No small bowel or colonic dilatation within the visualized abdomen. Vascular/Lymphatic: Infrarenal abdominal aorta measures up to 4.2 cm diameter. No evidence for abdominal lymphadenopathy. Other:  No intraperitoneal free fluid. Musculoskeletal:  Susceptibility artifact lower lumbar spine secondary to fusion hardware. IMPRESSION: 1. Markedly motion degraded study. Some series are nondiagnostic. 2. Peripancreatic edema consistent with pancreatitis. No dilatation of the main duct. 3. No definite choledocholithiasis although assessment is substantially degraded by motion artifact. 4. Infrarenal abdominal aortic aneurysm measuring up to 4.2 cm diameter. Recommend followup by ultrasound in 1 year. This recommendation follows ACR consensus guidelines: White Paper of the ACR Incidental Findings Committee II on Vascular Findings. J Am Coll Radiol 2013; 10:789-794. Aortic aneurysm NOS (ICD10-I71.9) Electronically Signed   By: Misty Stanley M.D.   On: 11/13/2019 14:32        Scheduled Meds: . albuterol  2.5 mg Nebulization BID  . docusate sodium  100 mg Oral BID  . dorzolamide-timolol  1 drop Left Eye BID  . enoxaparin (LOVENOX) injection  40 mg Subcutaneous Q24H  . ketorolac  1 drop Left Eye Daily   Continuous Infusions: . sodium chloride 75 mL/hr at 11/14/19 0400     LOS: 3 days    Time spent: 30 minutes    Joshalyn Ancheta Darleen Crocker, DO [facility]  If 7PM-7AM, please contact night-coverage [email or web address] 11/14/2019, 1:25 PM

## 2019-11-14 NOTE — Plan of Care (Signed)
  Problem: Education: Goal: Knowledge of General Education information will improve Description: Including pain rating scale, medication(s)/side effects and non-pharmacologic comfort measures Outcome: Not Progressing   Problem: Health Behavior/Discharge Planning: Goal: Ability to manage health-related needs will improve Outcome: Not Progressing   Problem: Clinical Measurements: Goal: Ability to maintain clinical measurements within normal limits will improve Outcome: Not Progressing Goal: Will remain free from infection Outcome: Not Progressing Goal: Diagnostic test results will improve Outcome: Not Progressing Goal: Respiratory complications will improve Outcome: Not Progressing Goal: Cardiovascular complication will be avoided Outcome: Not Progressing   Problem: Activity: Goal: Risk for activity intolerance will decrease Outcome: Not Progressing   Problem: Nutrition: Goal: Adequate nutrition will be maintained Outcome: Not Progressing   Problem: Coping: Goal: Level of anxiety will decrease Outcome: Not Progressing   Problem: Elimination: Goal: Will not experience complications related to bowel motility Outcome: Not Progressing Goal: Will not experience complications related to urinary retention Outcome: Not Progressing   Problem: Pain Managment: Goal: General experience of comfort will improve Outcome: Not Progressing   Problem: Safety: Goal: Ability to remain free from injury will improve Outcome: Not Progressing   Problem: Skin Integrity: Goal: Risk for impaired skin integrity will decrease Outcome: Not Progressing   Problem: Education: Goal: Knowledge of Pancreatitis treatment and prevention will improve Outcome: Not Progressing   Problem: Health Behavior/Discharge Planning: Goal: Ability to formulate a plan to maintain an alcohol-free life will improve Outcome: Not Progressing   Problem: Nutritional: Goal: Ability to achieve adequate nutritional  intake will improve Outcome: Not Progressing   Problem: Clinical Measurements: Goal: Complications related to the disease process, condition or treatment will be avoided or minimized Outcome: Not Progressing

## 2019-11-14 NOTE — Care Management Important Message (Signed)
Important Message  Patient Details  Name: [name] [name] MRN: [id number] Date of Birth: [birth year]   Medicare Important Message Given:  Yes     Tommy Medal 11/14/2019, 2:35 PM

## 2019-11-15 LAB — COMPREHENSIVE METABOLIC PANEL
ALT: 28 U/L (ref 0–44)
AST: 29 U/L (ref 15–41)
Albumin: 2.8 g/dL — ABNORMAL LOW (ref 3.5–5.0)
Alkaline Phosphatase: 62 U/L (ref 38–126)
Anion gap: 8 (ref 5–15)
BUN: 9 mg/dL (ref 8–23)
CO2: 30 mmol/L (ref 22–32)
Calcium: 8.4 mg/dL — ABNORMAL LOW (ref 8.9–10.3)
Chloride: 100 mmol/L (ref 98–111)
Creatinine, Ser: 0.7 mg/dL (ref 0.61–1.24)
GFR calc Af Amer: >60 mL/min (ref >60)
GFR calc non Af Amer: >60 mL/min (ref >60)
Glucose, Bld: 107 mg/dL — ABNORMAL HIGH (ref 70–99)
Potassium: 4 mmol/L (ref 3.5–5.1)
Sodium: 138 mmol/L (ref 135–145)
Total Bilirubin: 0.8 mg/dL (ref 0.3–1.2)
Total Protein: 5.9 g/dL — ABNORMAL LOW (ref 6.5–8.1)

## 2019-11-15 LAB — LIPASE, BLOOD: Lipase: 49 U/L (ref 11–51)

## 2019-11-15 MED ORDER — BUDESONIDE 0.25 MG/2ML IN SUSP
0.2500 mg | Freq: Two times a day (BID) | RESPIRATORY_TRACT | Status: DC
  Administered 2019-11-15 – 2019-11-16 (×3): 0.25 mg via RESPIRATORY_TRACT
  Filled 2019-11-15 (×3): qty 2

## 2019-11-15 NOTE — Progress Notes (Signed)
PROGRESS NOTE    [name] [name]  JSH:[id number] DOB: [birth year] DOA: 11/10/2019 PCP: Erven Colla, DO   Brief Narrative:  79 y.o.male,with medical history of AAA, asthma, COPD, GERD, CAD came to hospital with diffuse abdominal pain. Patient says that pain started after he ate sandwich yesterday, after that he developed bloating and pressure in abdomen. He denies nausea but had one episode of vomiting around 7 PM. Patient has had diarrhea for past 1 week and stated that for past 2 days he had 3 episodes of watery bowel movement. Denies any fever or chills. Patient said that he was on prednisone,which was prescribed by his ophthalmologist. It was stopped on 11/07/2019. Patient had eye surgery recently and is currently on PROLENSA and Cosopt eyedrops. Denies passing out. He denies drinking alcohol Patient had cholecystectomy done in 2000 In the ED lab work showed lipase elevated to 5229 LFTs are normal CT abdomen pelvis shows pancreatitis.  Assessment & Plan:   Active Problems:   Coronary atherosclerosis   COPD (chronic obstructive pulmonary disease) (HCC)   GERD (gastroesophageal reflux disease)   Insomnia   HTN (hypertension)   Pancreatitis   HOH (hard of hearing)   History of MI (myocardial infarction)  1. Acute idiopathic pancreatitis - Lipase levels have come down nicely and patient tolerated bowel rest well. He is having a lot of abdominal bloating and pain. He is still requiring IV pain medication.   MRI abdomen without choledocholithiasis.  Plan for further ambulation today and advance diet to full liquid from clear liquid. 2. GERD - continue protonix for GI protection. 3. Leukocytosis - WBCslightly improved, recheck in AM. No s/s of infection found.  4. HTN - BP better controlled, following.  5. COPD - stable, following.  6. CAD h/o MI - stable.  7. Infrarenal abdominal aortic aneurysm.  Follow-up in 1 year.  DVT prophylaxis:enoxaparin Code  Status:full Family Communication: Son at bedside on 6/30 Disposition:  Status is: Inpatient  Remains inpatient appropriate because:Ongoing active pain requiring inpatient pain management, Ongoing diagnostic testing needed not appropriate for outpatient work up and Inpatient level of care appropriate due to severity of illness.    Slowly advancing diet to full liquid.  Dispo: The patient is from: Home Anticipated d/c is to: Home Anticipated d/c date is: 1-2days Patient currently is not medically stable to d/c.  Consultants:   None  Procedures:   See below  Antimicrobials:   None  Subjective: Patient seen and evaluated today with no new acute complaints or concerns. No acute concerns or events noted overnight.  He states that his abdominal pain is decreasing and he has less bloating.  No nausea or vomiting noted.  He is able to tolerate some clear liquids.  Objective: Vitals:   11/15/19 0300 11/15/19 0509 11/15/19 0830 11/15/19 1058  BP:  120/72    Pulse:  83    Resp:  16    Temp:  97.8 F (36.6 C)    TempSrc:  Oral    SpO2: 95% 97% 93% 96%  Weight:      Height:        Intake/Output Summary (Last 24 hours) at 11/15/2019 1307 Last data filed at 11/15/2019 0800 Gross per 24 hour  Intake 1944.78 ml  Output 1200 ml  Net 744.78 ml   Filed Weights   11/10/19 2127 11/11/19 0810  Weight: 90.7 kg 90 kg    Examination:  General exam: Appears calm and comfortable  Respiratory system: Clear to auscultation. Respiratory  effort normal. Cardiovascular system: S1 & S2 heard, RRR. No JVD, murmurs, rubs, gallops or clicks. No pedal edema. Gastrointestinal system: Abdomen is minimally distended, soft and nontender. No organomegaly or masses felt. Normal bowel sounds heard. Central nervous system: Alert and oriented. No focal neurological deficits. Extremities: Symmetric 5 x 5 power. Skin: No rashes, lesions or ulcers Psychiatry:  Judgement and insight appear normal. Mood & affect appropriate.     Data Reviewed: I have personally reviewed following labs and imaging studies  CBC: Recent Labs  Lab 11/10/19 2150 11/12/19 0350 11/13/19 0420 11/14/19 0435  WBC 15.4* 15.3* 14.9* 11.5*  NEUTROABS  --   --  12.3* 9.0*  HGB 15.3 13.1 12.5* 11.8*  HCT 48.7 43.6 40.8 37.8*  MCV 89.0 92.2 92.1 90.9  PLT 248 201 173 354   Basic Metabolic Panel: Recent Labs  Lab 11/10/19 2150 11/12/19 0350 11/13/19 0420 11/14/19 0435 11/15/19 0600  NA 140 139 138 138 138  K 4.2 4.0 4.0 3.7 4.0  CL 102 102 102 100 100  CO2 29 28 28  32 30  GLUCOSE 155* 80 95 106* 107*  BUN 14 15 11 11 9   CREATININE 1.08 1.00 0.78 0.70 0.70  CALCIUM 9.6 8.2* 8.2* 8.2* 8.4*   GFR: Estimated Creatinine Clearance: 86 mL/min (by C-G formula based on SCr of 0.7 mg/dL). Liver Function Tests: Recent Labs  Lab 11/10/19 2341 11/12/19 0350 11/13/19 0420 11/14/19 0435 11/15/19 0600  AST 25 16 22 24 29   ALT 20 16 21 23 28   ALKPHOS 67 51 59 61 62  BILITOT 0.7 0.8 1.3* 1.0 0.8  PROT 7.8 6.3* 6.2* 6.1* 5.9*  ALBUMIN 4.3 3.3* 3.0* 2.8* 2.8*   Recent Labs  Lab 11/10/19 2150 11/12/19 0350 11/13/19 0420 11/14/19 0435 11/15/19 0600  LIPASE 5,229* 153* 43 41 49   No results for input(s): AMMONIA in the last 168 hours. Coagulation Profile: No results for input(s): INR, PROTIME in the last 168 hours. Cardiac Enzymes: No results for input(s): CKTOTAL, CKMB, CKMBINDEX, TROPONINI in the last 168 hours. BNP (last 3 results) No results for input(s): PROBNP in the last 8760 hours. HbA1C: No results for input(s): HGBA1C in the last 72 hours. CBG: No results for input(s): GLUCAP in the last 168 hours. Lipid Profile: No results for input(s): CHOL, HDL, LDLCALC, TRIG, CHOLHDL, LDLDIRECT in the last 72 hours. Thyroid Function Tests: No results for input(s): TSH, T4TOTAL, FREET4, T3FREE, THYROIDAB in the last 72 hours. Anemia Panel: No results for  input(s): VITAMINB12, FOLATE, FERRITIN, TIBC, IRON, RETICCTPCT in the last 72 hours. Sepsis Labs: No results for input(s): PROCALCITON, LATICACIDVEN in the last 168 hours.  Recent Results (from the past 240 hour(s))  SARS Coronavirus 2 by RT PCR (hospital order, performed in [facility] hospital lab) Nasopharyngeal Nasopharyngeal Swab     Status: None   Collection Time: 11/11/19  4:46 AM   Specimen: Nasopharyngeal Swab  Result Value Ref Range Status   SARS Coronavirus 2 NEGATIVE NEGATIVE Final    Comment: (NOTE) SARS-CoV-2 target nucleic acids are NOT DETECTED.  The SARS-CoV-2 RNA is generally detectable in upper and lower respiratory specimens during the acute phase of infection. The lowest concentration of SARS-CoV-2 viral copies this assay can detect is 250 copies / mL. A negative result does not preclude SARS-CoV-2 infection and should not be used as the sole basis for treatment or other patient management decisions.  A negative result may occur with improper specimen collection / handling, submission of specimen other  than nasopharyngeal swab, presence of viral mutation(s) within the areas targeted by this assay, and inadequate number of viral copies (<250 copies / mL). A negative result must be combined with clinical observations, patient history, and epidemiological information.  Fact Sheet for Patients:   StrictlyIdeas.no  Fact Sheet for Healthcare Providers: BankingDealers.co.za  This test is not yet approved or  cleared by the [place] FDA and has been authorized for detection and/or diagnosis of SARS-CoV-2 by FDA under an Emergency Use Authorization (EUA).  This EUA will remain in effect (meaning this test can be used) for the duration of the COVID-19 declaration under Section 564(b)(1) of the Act, 21 U.S.C. section 360bbb-3(b)(1), unless the authorization is terminated or revoked sooner.  Performed at [facility], [address], [place], [place] [address]          Radiology Studies: MR 3D Recon At Scanner  Result Date: 11/13/2019 CLINICAL DATA:  Diffuse abdominal pain for 1 day. Pancreatitis suspected. EXAM: MRI ABDOMEN WITHOUT AND WITH CONTRAST (INCLUDING MRCP) TECHNIQUE: Multiplanar multisequence MR imaging of the abdomen was performed both before and after the administration of intravenous contrast. Heavily T2-weighted images of the biliary and pancreatic ducts were obtained, and three-dimensional MRCP images were rendered by post processing. CONTRAST:  41mL GADAVIST GADOBUTROL 1 MMOL/ML IV SOLN COMPARISON:  CT scan 11/11/2019 FINDINGS: This exam is markedly motion degraded. Some series are nondiagnostic. lower chest: Asymmetric elevation left hemidiaphragm Hepatobiliary: The no gross abnormality within the liver parenchyma although small or subtle lesions could be obscured by artifact. Mild intrahepatic biliary duct prominence. Gallbladder surgically absent. Common duct in the hepatoduodenal ligament is mildly attenuated although no mass lesion or lymphadenopathy is evident on this degraded study. Common bile duct in the head of pancreas measures 7-8 mm diameter with central flow artifact noted on axial T2 imaging towards the ampulla. MRCP imaging shows no definite choledocholithiasis although assessment is substantially degraded by motion artifact. Pancreas: No dilatation of the main duct. Peripancreatic edema evident, similar to prior CT. Pancreatic parenchyma enhances throughout although fine detail is obscured by motion. Spleen:  No splenomegaly. No focal mass lesion. Adrenals/Urinary Tract: No adrenal nodule or mass. No hydronephrosis. No gross renal mass lesion. Probable 12 mm cyst upper pole right kidney. Assessment of kidneys degraded by motion artifact. Stomach/Bowel: Stomach is unremarkable. No gastric wall thickening. No evidence of outlet obstruction. Duodenum is normally positioned as is the  ligament of Treitz. No small bowel or colonic dilatation within the visualized abdomen. Vascular/Lymphatic: Infrarenal abdominal aorta measures up to 4.2 cm diameter. No evidence for abdominal lymphadenopathy. Other:  No intraperitoneal free fluid. Musculoskeletal: Susceptibility artifact lower lumbar spine secondary to fusion hardware. IMPRESSION: 1. Markedly motion degraded study. Some series are nondiagnostic. 2. Peripancreatic edema consistent with pancreatitis. No dilatation of the main duct. 3. No definite choledocholithiasis although assessment is substantially degraded by motion artifact. 4. Infrarenal abdominal aortic aneurysm measuring up to 4.2 cm diameter. Recommend followup by ultrasound in 1 year. This recommendation follows ACR consensus guidelines: White Paper of the ACR Incidental Findings Committee II on Vascular Findings. J Am Coll Radiol 2013; 10:789-794. Aortic aneurysm NOS (ICD10-I71.9) Electronically Signed   By: Misty Stanley M.D.   On: 11/13/2019 14:32   MR ABDOMEN MRCP W WO CONTAST  Result Date: 11/13/2019 CLINICAL DATA:  Diffuse abdominal pain for 1 day. Pancreatitis suspected. EXAM: MRI ABDOMEN WITHOUT AND WITH CONTRAST (INCLUDING MRCP) TECHNIQUE: Multiplanar multisequence MR imaging of the abdomen was performed both before and after  the administration of intravenous contrast. Heavily T2-weighted images of the biliary and pancreatic ducts were obtained, and three-dimensional MRCP images were rendered by post processing. CONTRAST:  14mL GADAVIST GADOBUTROL 1 MMOL/ML IV SOLN COMPARISON:  CT scan 11/11/2019 FINDINGS: This exam is markedly motion degraded. Some series are nondiagnostic. lower chest: Asymmetric elevation left hemidiaphragm Hepatobiliary: The no gross abnormality within the liver parenchyma although small or subtle lesions could be obscured by artifact. Mild intrahepatic biliary duct prominence. Gallbladder surgically absent. Common duct in the hepatoduodenal ligament is  mildly attenuated although no mass lesion or lymphadenopathy is evident on this degraded study. Common bile duct in the head of pancreas measures 7-8 mm diameter with central flow artifact noted on axial T2 imaging towards the ampulla. MRCP imaging shows no definite choledocholithiasis although assessment is substantially degraded by motion artifact. Pancreas: No dilatation of the main duct. Peripancreatic edema evident, similar to prior CT. Pancreatic parenchyma enhances throughout although fine detail is obscured by motion. Spleen:  No splenomegaly. No focal mass lesion. Adrenals/Urinary Tract: No adrenal nodule or mass. No hydronephrosis. No gross renal mass lesion. Probable 12 mm cyst upper pole right kidney. Assessment of kidneys degraded by motion artifact. Stomach/Bowel: Stomach is unremarkable. No gastric wall thickening. No evidence of outlet obstruction. Duodenum is normally positioned as is the ligament of Treitz. No small bowel or colonic dilatation within the visualized abdomen. Vascular/Lymphatic: Infrarenal abdominal aorta measures up to 4.2 cm diameter. No evidence for abdominal lymphadenopathy. Other:  No intraperitoneal free fluid. Musculoskeletal: Susceptibility artifact lower lumbar spine secondary to fusion hardware. IMPRESSION: 1. Markedly motion degraded study. Some series are nondiagnostic. 2. Peripancreatic edema consistent with pancreatitis. No dilatation of the main duct. 3. No definite choledocholithiasis although assessment is substantially degraded by motion artifact. 4. Infrarenal abdominal aortic aneurysm measuring up to 4.2 cm diameter. Recommend followup by ultrasound in 1 year. This recommendation follows ACR consensus guidelines: White Paper of the ACR Incidental Findings Committee II on Vascular Findings. J Am Coll Radiol 2013; 10:789-794. Aortic aneurysm NOS (ICD10-I71.9) Electronically Signed   By: Misty Stanley M.D.   On: 11/13/2019 14:32        Scheduled Meds: .  albuterol  2.5 mg Nebulization BID  . budesonide  0.25 mg Nebulization BID  . docusate sodium  100 mg Oral BID  . dorzolamide-timolol  1 drop Left Eye BID  . enoxaparin (LOVENOX) injection  40 mg Subcutaneous Q24H  . ketorolac  1 drop Left Eye Daily   Continuous Infusions: . sodium chloride 75 mL/hr at 11/15/19 0514     LOS: 4 days    Time spent: 30 minutes    Meilah Delrosario Darleen Crocker, DO [facility]  If 7PM-7AM, please contact night-coverage [email or web address] 11/15/2019, 1:07 PM

## 2019-11-15 NOTE — Plan of Care (Signed)

## 2019-11-16 LAB — COMPREHENSIVE METABOLIC PANEL
ALT: 42 U/L (ref 0–44)
AST: 41 U/L (ref 15–41)
Albumin: 3 g/dL — ABNORMAL LOW (ref 3.5–5.0)
Alkaline Phosphatase: 72 U/L (ref 38–126)
Anion gap: 11 (ref 5–15)
BUN: 7 mg/dL — ABNORMAL LOW (ref 8–23)
CO2: 30 mmol/L (ref 22–32)
Calcium: 8.7 mg/dL — ABNORMAL LOW (ref 8.9–10.3)
Chloride: 100 mmol/L (ref 98–111)
Creatinine, Ser: 0.72 mg/dL (ref 0.61–1.24)
GFR calc Af Amer: >60 mL/min (ref >60)
GFR calc non Af Amer: >60 mL/min (ref >60)
Glucose, Bld: 107 mg/dL — ABNORMAL HIGH (ref 70–99)
Potassium: 3.6 mmol/L (ref 3.5–5.1)
Sodium: 141 mmol/L (ref 135–145)
Total Bilirubin: 0.7 mg/dL (ref 0.3–1.2)
Total Protein: 6.6 g/dL (ref 6.5–8.1)

## 2019-11-16 LAB — CBC
HCT: 39.4 % (ref 39.0–52.0)
Hemoglobin: 12.2 g/dL — ABNORMAL LOW (ref 13.0–17.0)
MCH: 27.9 pg (ref 26.0–34.0)
MCHC: 31 g/dL (ref 30.0–36.0)
MCV: 90 fL (ref 80.0–100.0)
Platelets: 203 10*3/uL (ref 150–400)
RBC: 4.38 MIL/uL (ref 4.22–5.81)
RDW: 13.4 % (ref 11.5–15.5)
WBC: 7.9 10*3/uL (ref 4.0–10.5)
nRBC: 0 % (ref 0.0–0.2)

## 2019-11-16 MED ORDER — DOCUSATE SODIUM 100 MG PO CAPS: 100.0000 mg | ORAL_CAPSULE | Freq: Every day | ORAL | 0 refills | Status: DC | PRN

## 2019-11-16 MED ORDER — ONDANSETRON HCL 4 MG PO TABS: 4.0000 mg | ORAL_TABLET | Freq: Four times a day (QID) | ORAL | 0 refills | Status: DC | PRN

## 2019-11-16 NOTE — Discharge Summary (Signed)
Physician Discharge Summary  [name] [name] FUX:[id number] DOB: [birth year] DOA: 11/10/2019  PCP: Erven Colla, DO  Admit date: 11/10/2019  Discharge date: 11/16/2019  Admitted From:Home  Disposition:  Home  Recommendations for Outpatient Follow-up:  1. Follow up with PCP in 1-2 weeks 2. Follow-up with GI as recommended in the next 2 weeks for outpatient evaluation of pancreatitis 3. Continue medications as prescribed below 4. Recommend follow-up for infrarenal abdominal aortic aneurysm in 1 year.  Repeat CT to be scheduled by PCP.  Home Health: None  Equipment/Devices: None  Discharge Condition: Stable  CODE STATUS: Full  Diet recommendation: Heart Healthy  Brief/Interim Summary: 79 y.o.male,with medical history of AAA, asthma, COPD, GERD, CAD came to hospital with diffuse abdominal pain. Patient says that pain started after he ate sandwich yesterday, after that he developed bloating and pressure in abdomen. He denies nausea but had one episode of vomiting around 7 PM. Patient has had diarrhea for past 1 week and stated that for past 2 days he had 3 episodes of watery bowel movement. Denies any fever or chills. Patient said that he was on prednisone,which was prescribed by his ophthalmologist. It was stopped on 11/07/2019. Patient had eye surgery recently and is currently on PROLENSA and Cosopt eyedrops. Denies passing out. He denies drinking alcohol Patient had cholecystectomy done in 2000 In the ED lab work showed lipase elevated to 5229 LFTs are normal CT abdomen pelvis shows pancreatitis.  1. Acute idiopathic pancreatitis - Lipase levels have come down nicely and patient tolerated bowel rest well.  He is now tolerating full diet without any concerns. MRI abdomen without choledocholithiasis.  No alcohol use reported and no significant findings on lipid panel noted.  No significant medications that appear to be contributing to the pancreatitis.  Plan to follow-up with  GI in the outpatient setting.  He will call to schedule. 2. GERD -continue PPI 3. Leukocytosis -improved 4. HTN - BP better controlled, resume home medications 5. COPD - stable. 6. CAD h/o MI - stable. 7. Infrarenal abdominal aortic aneurysm. Follow-up in 1 year.  Discharge Diagnoses:  Active Problems:   Coronary atherosclerosis   COPD (chronic obstructive pulmonary disease) (HCC)   GERD (gastroesophageal reflux disease)   Insomnia   HTN (hypertension)   Pancreatitis   HOH (hard of hearing)   History of MI (myocardial infarction)  Principal discharge diagnosis: Acute idiopathic pancreatitis.  Discharge Instructions  Discharge Instructions    Diet - low sodium heart healthy   Complete by: As directed    Increase activity slowly   Complete by: As directed      Allergies as of 11/16/2019      Reactions   Lasix [furosemide] Rash   Cefzil [cefprozil] Nausea Only   Dexamethasone Swelling   Gabapentin    Legs swell up    Neomycin Other (See Comments)   Patient can't remember.   Tetracyclines & Related Itching   Ciprofloxacin Nausea And Vomiting, Rash, Other (See Comments)   Body aches   Methocarbamol Swelling, Rash   Penicillins Rash   Has patient had a PCN reaction causing immediate rash, facial/tongue/throat swelling, SOB or lightheadedness with hypotension: yes Has patient had a PCN reaction causing severe rash involving mucus membranes or skin necrosis: unknown Has patient had a PCN reaction that required hospitalization: no Has patient had a PCN reaction occurring within the last 10 years: No If all of the above answers are "NO", then may proceed with Cephalosporin use.  Medication List    TAKE these medications   cetirizine 10 MG tablet Commonly known as: ZYRTEC Take 10 mg by mouth daily.   docusate sodium 100 MG capsule Commonly known as: COLACE Take 1 capsule (100 mg total) by mouth daily as needed for mild constipation or moderate constipation.    dorzolamide-timolol 22.3-6.8 MG/ML ophthalmic solution Commonly known as: COSOPT Place 1 drop into both eyes 2 (two) times daily.   esomeprazole 20 MG capsule Commonly known as: NEXIUM Take 20 mg by mouth daily at 12 noon.   fluticasone 220 MCG/ACT inhaler Commonly known as: FLOVENT HFA Inhale 2 puffs into the lungs 2 (two) times daily.   LORazepam 1 MG tablet Commonly known as: ATIVAN Take 0.5 tablets (0.5 mg total) by mouth at bedtime.   losartan 25 MG tablet Commonly known as: COZAAR Take 1 tablet (25 mg total) by mouth daily.   lovastatin 40 MG tablet Commonly known as: MEVACOR Take 1 tablet (40 mg total) by mouth daily.   multivitamin tablet Take 1 tablet by mouth daily.   nitroGLYCERIN 0.4 MG SL tablet Commonly known as: NITROSTAT Place 1 tablet (0.4 mg total) under the tongue every 5 (five) minutes as needed for chest pain.   nystatin 100000 UNIT/ML suspension Commonly known as: MYCOSTATIN Take 5 mLs by mouth every 6 (six) hours.   ondansetron 4 MG tablet Commonly known as: ZOFRAN Take 1 tablet (4 mg total) by mouth every 6 (six) hours as needed for nausea.   ProAir HFA 108 (90 Base) MCG/ACT inhaler Generic drug: albuterol INHALE 2 PUFFS INTO THE LUNGS EVERY SIX HOURS AS NEEDED FOR WHEEZING. What changed: Another medication with the same name was changed. Make sure you understand how and when to take each.   albuterol (2.5 MG/3ML) 0.083% nebulizer solution Commonly known as: PROVENTIL INHALE 1 VIAL VIA NEBULIZER FOUR TIMES DAILY What changed: See the new instructions.   Prolensa 0.07 % Soln Generic drug: Bromfenac Sodium Place 1 drop into the left eye daily.       Follow-up Information    [name] M, DO Follow up in 1 week(s).   Specialty: Family Medicine Contact information: [place] [address] [phone]        [place]. Schedule an appointment as soon as possible for a visit in 2 week(s).    Contact information: [place] [phone]             Allergies  Allergen Reactions  . Lasix [Furosemide] Rash  . Cefzil [Cefprozil] Nausea Only  . Dexamethasone Swelling  . Gabapentin     Legs swell up   . Neomycin Other (See Comments)    Patient can't remember.  . Tetracyclines & Related Itching  . Ciprofloxacin Nausea And Vomiting, Rash and Other (See Comments)    Body aches   . Methocarbamol Swelling and Rash  . Penicillins Rash    Has patient had a PCN reaction causing immediate rash, facial/tongue/throat swelling, SOB or lightheadedness with hypotension: yes Has patient had a PCN reaction causing severe rash involving mucus membranes or skin necrosis: unknown Has patient had a PCN reaction that required hospitalization: no Has patient had a PCN reaction occurring within the last 10 years: No If all of the above answers are "NO", then may proceed with Cephalosporin use.     Consultations:  None   Procedures/Studies: CT Abdomen Pelvis W Contrast  Result Date: 11/11/2019 CLINICAL DATA:  Epigastric abdominal pain  EXAM: CT ABDOMEN AND PELVIS WITH CONTRAST TECHNIQUE: Multidetector CT imaging of the abdomen and pelvis was performed using the standard protocol following bolus administration of intravenous contrast. CONTRAST:  16mL OMNIPAQUE IOHEXOL 300 MG/ML  SOLN COMPARISON:  06/30/2017 FINDINGS: LOWER CHEST: Normal. HEPATOBILIARY: Normal hepatic contours. No intra- or extrahepatic biliary dilatation. Status post cholecystectomy. PANCREAS: There is inflammatory stranding surrounding the pancreas. No peripancreatic fluid collection. No pancreatic necrosis. Fluid and inflammation track into the retroperitoneum/anterior pararenal space along the duodenum. SPLEEN: Normal. ADRENALS/URINARY TRACT: The adrenal glands are normal. No hydronephrosis, nephroureterolithiasis or solid renal mass. The urinary bladder is normal for degree of  distention STOMACH/BOWEL: There is no hiatal hernia. Normal duodenal course and caliber. No small bowel dilatation or inflammation. Rectosigmoid diverticulosis without acute inflammation. The appendix is not visualized. No right lower quadrant inflammation or free fluid. VASCULAR/LYMPHATIC: 4 cm abdominal aortic aneurysm, unchanged. Moderate volume noncalcified atherosclerotic plaque. REPRODUCTIVE: Enlarged prostate measures 5.7 cm in transverse dimension. MUSCULOSKELETAL. Multilevel degenerative disc disease. Grade 1 retrolisthesis at L3-4. L5-S1 PLIF. OTHER: None. IMPRESSION: 1. Acute pancreatitis without peripancreatic fluid collection or pancreatic necrosis. 2. Rectosigmoid diverticulosis without acute inflammation. 3. Recommend followup by ultrasound in 1 year. This recommendation follows ACR consensus guidelines: White Paper of the ACR Incidental Findings Committee II on Vascular Findings. J Am Coll Radiol 2013; 10:789-794. Aortic aneurysm NOS (ICD10-I71.9) Aortic atherosclerosis (ICD10-I70.0). Electronically Signed   By: Ulyses Jarred M.D.   On: 11/11/2019 02:47   MR 3D Recon At Scanner  Result Date: 11/13/2019 CLINICAL DATA:  Diffuse abdominal pain for 1 day. Pancreatitis suspected. EXAM: MRI ABDOMEN WITHOUT AND WITH CONTRAST (INCLUDING MRCP) TECHNIQUE: Multiplanar multisequence MR imaging of the abdomen was performed both before and after the administration of intravenous contrast. Heavily T2-weighted images of the biliary and pancreatic ducts were obtained, and three-dimensional MRCP images were rendered by post processing. CONTRAST:  60mL GADAVIST GADOBUTROL 1 MMOL/ML IV SOLN COMPARISON:  CT scan 11/11/2019 FINDINGS: This exam is markedly motion degraded. Some series are nondiagnostic. lower chest: Asymmetric elevation left hemidiaphragm Hepatobiliary: The no gross abnormality within the liver parenchyma although small or subtle lesions could be obscured by artifact. Mild intrahepatic biliary duct  prominence. Gallbladder surgically absent. Common duct in the hepatoduodenal ligament is mildly attenuated although no mass lesion or lymphadenopathy is evident on this degraded study. Common bile duct in the head of pancreas measures 7-8 mm diameter with central flow artifact noted on axial T2 imaging towards the ampulla. MRCP imaging shows no definite choledocholithiasis although assessment is substantially degraded by motion artifact. Pancreas: No dilatation of the main duct. Peripancreatic edema evident, similar to prior CT. Pancreatic parenchyma enhances throughout although fine detail is obscured by motion. Spleen:  No splenomegaly. No focal mass lesion. Adrenals/Urinary Tract: No adrenal nodule or mass. No hydronephrosis. No gross renal mass lesion. Probable 12 mm cyst upper pole right kidney. Assessment of kidneys degraded by motion artifact. Stomach/Bowel: Stomach is unremarkable. No gastric wall thickening. No evidence of outlet obstruction. Duodenum is normally positioned as is the ligament of Treitz. No small bowel or colonic dilatation within the visualized abdomen. Vascular/Lymphatic: Infrarenal abdominal aorta measures up to 4.2 cm diameter. No evidence for abdominal lymphadenopathy. Other:  No intraperitoneal free fluid. Musculoskeletal: Susceptibility artifact lower lumbar spine secondary to fusion hardware. IMPRESSION: 1. Markedly motion degraded study. Some series are nondiagnostic. 2. Peripancreatic edema consistent with pancreatitis. No dilatation of the main duct. 3. No definite choledocholithiasis although assessment is substantially degraded by motion artifact. 4. Infrarenal  abdominal aortic aneurysm measuring up to 4.2 cm diameter. Recommend followup by ultrasound in 1 year. This recommendation follows ACR consensus guidelines: White Paper of the ACR Incidental Findings Committee II on Vascular Findings. J Am Coll Radiol 2013; 10:789-794. Aortic aneurysm NOS (ICD10-I71.9) Electronically  Signed   By: Misty Stanley M.D.   On: 11/13/2019 14:32   DG CHEST PORT 1 VIEW  Result Date: 11/13/2019 CLINICAL DATA:  Leukocytosis.  Pancreatitis EXAM: PORTABLE CHEST 1 VIEW COMPARISON:  08/06/2019 FINDINGS: Mild atelectasis at the right base. There is no edema, consolidation, effusion, or pneumothorax. Normal heart size. Aortic tortuosity. IMPRESSION: Mild atelectasis. Electronically Signed   By: Monte Fantasia M.D.   On: 11/13/2019 07:26   DG Abd Portable 2V  Result Date: 11/13/2019 CLINICAL DATA:  Abdominal pain and pancreatitis EXAM: PORTABLE ABDOMEN - 2 VIEW COMPARISON:  Abdominal CT 2 days ago FINDINGS: Cholecystectomy and PLIF hardware. Normal bowel gas pattern. Mild atelectasis at the bases. No concerning mass effect or gas collection. IMPRESSION: Normal bowel gas pattern. Electronically Signed   By: Monte Fantasia M.D.   On: 11/13/2019 07:28   MR ABDOMEN MRCP W WO CONTAST  Result Date: 11/13/2019 CLINICAL DATA:  Diffuse abdominal pain for 1 day. Pancreatitis suspected. EXAM: MRI ABDOMEN WITHOUT AND WITH CONTRAST (INCLUDING MRCP) TECHNIQUE: Multiplanar multisequence MR imaging of the abdomen was performed both before and after the administration of intravenous contrast. Heavily T2-weighted images of the biliary and pancreatic ducts were obtained, and three-dimensional MRCP images were rendered by post processing. CONTRAST:  78mL GADAVIST GADOBUTROL 1 MMOL/ML IV SOLN COMPARISON:  CT scan 11/11/2019 FINDINGS: This exam is markedly motion degraded. Some series are nondiagnostic. lower chest: Asymmetric elevation left hemidiaphragm Hepatobiliary: The no gross abnormality within the liver parenchyma although small or subtle lesions could be obscured by artifact. Mild intrahepatic biliary duct prominence. Gallbladder surgically absent. Common duct in the hepatoduodenal ligament is mildly attenuated although no mass lesion or lymphadenopathy is evident on this degraded study. Common bile duct in the  head of pancreas measures 7-8 mm diameter with central flow artifact noted on axial T2 imaging towards the ampulla. MRCP imaging shows no definite choledocholithiasis although assessment is substantially degraded by motion artifact. Pancreas: No dilatation of the main duct. Peripancreatic edema evident, similar to prior CT. Pancreatic parenchyma enhances throughout although fine detail is obscured by motion. Spleen:  No splenomegaly. No focal mass lesion. Adrenals/Urinary Tract: No adrenal nodule or mass. No hydronephrosis. No gross renal mass lesion. Probable 12 mm cyst upper pole right kidney. Assessment of kidneys degraded by motion artifact. Stomach/Bowel: Stomach is unremarkable. No gastric wall thickening. No evidence of outlet obstruction. Duodenum is normally positioned as is the ligament of Treitz. No small bowel or colonic dilatation within the visualized abdomen. Vascular/Lymphatic: Infrarenal abdominal aorta measures up to 4.2 cm diameter. No evidence for abdominal lymphadenopathy. Other:  No intraperitoneal free fluid. Musculoskeletal: Susceptibility artifact lower lumbar spine secondary to fusion hardware. IMPRESSION: 1. Markedly motion degraded study. Some series are nondiagnostic. 2. Peripancreatic edema consistent with pancreatitis. No dilatation of the main duct. 3. No definite choledocholithiasis although assessment is substantially degraded by motion artifact. 4. Infrarenal abdominal aortic aneurysm measuring up to 4.2 cm diameter. Recommend followup by ultrasound in 1 year. This recommendation follows ACR consensus guidelines: White Paper of the ACR Incidental Findings Committee II on Vascular Findings. J Am Coll Radiol 2013; 10:789-794. Aortic aneurysm NOS (ICD10-I71.9) Electronically Signed   By: Misty Stanley M.D.   On: 11/13/2019 14:32  Discharge Exam: Vitals:   11/16/19 0602 11/16/19 0712  BP: 132/78   Pulse: 72   Resp: 18   Temp: 97.7 F (36.5 C)   SpO2: 94% 93%   Vitals:    11/15/19 1957 11/15/19 2136 11/16/19 0602 11/16/19 0712  BP:  127/80 132/78   Pulse: 80 81 72   Resp: 18 20 18    Temp:  98.8 F (37.1 C) 97.7 F (36.5 C)   TempSrc:  Oral Oral   SpO2: 93% 97% 94% 93%  Weight:      Height:        General: Pt is alert, awake, not in acute distress Cardiovascular: RRR, S1/S2 +, no rubs, no gallops Respiratory: CTA bilaterally, no wheezing, no rhonchi Abdominal: Soft, NT, ND, bowel sounds + Extremities: no edema, no cyanosis    The results of significant diagnostics from this hospitalization (including imaging, microbiology, ancillary and laboratory) are listed below for reference.     Microbiology: Recent Results (from the past 240 hour(s))  SARS Coronavirus 2 by RT PCR (hospital order, performed in [facility] hospital lab) Nasopharyngeal Nasopharyngeal Swab     Status: None   Collection Time: 11/11/19  4:46 AM   Specimen: Nasopharyngeal Swab  Result Value Ref Range Status   SARS Coronavirus 2 NEGATIVE NEGATIVE Final    Comment: (NOTE) SARS-CoV-2 target nucleic acids are NOT DETECTED.  The SARS-CoV-2 RNA is generally detectable in upper and lower respiratory specimens during the acute phase of infection. The lowest concentration of SARS-CoV-2 viral copies this assay can detect is 250 copies / mL. A negative result does not preclude SARS-CoV-2 infection and should not be used as the sole basis for treatment or other patient management decisions.  A negative result may occur with improper specimen collection / handling, submission of specimen other than nasopharyngeal swab, presence of viral mutation(s) within the areas targeted by this assay, and inadequate number of viral copies (<250 copies / mL). A negative result must be combined with clinical observations, patient history, and epidemiological information.  Fact Sheet for Patients:   StrictlyIdeas.no  Fact Sheet for Healthcare  Providers: BankingDealers.co.za  This test is not yet approved or  cleared by the [place] FDA and has been authorized for detection and/or diagnosis of SARS-CoV-2 by FDA under an Emergency Use Authorization (EUA).  This EUA will remain in effect (meaning this test can be used) for the duration of the COVID-19 declaration under Section 564(b)(1) of the Act, 21 U.S.C. section 360bbb-3(b)(1), unless the authorization is terminated or revoked sooner.  Performed at [facility], [address], [place], [place] [address]      Labs: BNP (last 3 results) No results for input(s): BNP in the last 8760 hours. Basic Metabolic Panel: Recent Labs  Lab 11/12/19 0350 11/13/19 0420 11/14/19 0435 11/15/19 0600 11/16/19 0821  NA 139 138 138 138 141  K 4.0 4.0 3.7 4.0 3.6  CL 102 102 100 100 100  CO2 28 28 32 30 30  GLUCOSE 80 95 106* 107* 107*  BUN 15 11 11 9  7*  CREATININE 1.00 0.78 0.70 0.70 0.72  CALCIUM 8.2* 8.2* 8.2* 8.4* 8.7*   Liver Function Tests: Recent Labs  Lab 11/12/19 0350 11/13/19 0420 11/14/19 0435 11/15/19 0600 11/16/19 0821  AST 16 22 24 29  41  ALT 16 21 23 28  42  ALKPHOS 51 59 61 62 72  BILITOT 0.8 1.3* 1.0 0.8 0.7  PROT 6.3* 6.2* 6.1* 5.9* 6.6  ALBUMIN 3.3* 3.0* 2.8*  2.8* 3.0*   Recent Labs  Lab 11/10/19 2150 11/12/19 0350 11/13/19 0420 11/14/19 0435 11/15/19 0600  LIPASE 5,229* 153* 43 41 49   No results for input(s): AMMONIA in the last 168 hours. CBC: Recent Labs  Lab 11/10/19 2150 11/12/19 0350 11/13/19 0420 11/14/19 0435 11/16/19 0821  WBC 15.4* 15.3* 14.9* 11.5* 7.9  NEUTROABS  --   --  12.3* 9.0*  --   HGB 15.3 13.1 12.5* 11.8* 12.2*  HCT 48.7 43.6 40.8 37.8* 39.4  MCV 89.0 92.2 92.1 90.9 90.0  PLT 248 201 173 166 203   Cardiac Enzymes: No results for input(s): CKTOTAL, CKMB, CKMBINDEX, TROPONINI in the last 168 hours. BNP: Invalid input(s): POCBNP CBG: No results for input(s): GLUCAP in the last 168  hours. D-Dimer No results for input(s): DDIMER in the last 72 hours. Hgb A1c No results for input(s): HGBA1C in the last 72 hours. Lipid Profile No results for input(s): CHOL, HDL, LDLCALC, TRIG, CHOLHDL, LDLDIRECT in the last 72 hours. Thyroid function studies No results for input(s): TSH, T4TOTAL, T3FREE, THYROIDAB in the last 72 hours.  Invalid input(s): FREET3 Anemia work up No results for input(s): VITAMINB12, FOLATE, FERRITIN, TIBC, IRON, RETICCTPCT in the last 72 hours. Urinalysis    Component Value Date/Time   COLORURINE YELLOW 11/11/2019 1706   APPEARANCEUR HAZY (A) 11/11/2019 1706   LABSPEC 1.040 (H) 11/11/2019 1706   PHURINE 5.0 11/11/2019 1706   GLUCOSEU NEGATIVE 11/11/2019 1706   HGBUR MODERATE (A) 11/11/2019 1706   BILIRUBINUR NEGATIVE 11/11/2019 1706   BILIRUBINUR + 05/25/2018 1322   KETONESUR NEGATIVE 11/11/2019 1706   PROTEINUR 30 (A) 11/11/2019 1706   UROBILINOGEN 2.0 03/05/2016 1544   UROBILINOGEN 0.2 12/04/2014 0240   NITRITE NEGATIVE 11/11/2019 1706   LEUKOCYTESUR TRACE (A) 11/11/2019 1706   Sepsis Labs Invalid input(s): PROCALCITONIN,  WBC,  LACTICIDVEN Microbiology Recent Results (from the past 240 hour(s))  SARS Coronavirus 2 by RT PCR (hospital order, performed in [place] [facility] hospital lab) Nasopharyngeal Nasopharyngeal Swab     Status: None   Collection Time: 11/11/19  4:46 AM   Specimen: Nasopharyngeal Swab  Result Value Ref Range Status   SARS Coronavirus 2 NEGATIVE NEGATIVE Final    Comment: (NOTE) SARS-CoV-2 target nucleic acids are NOT DETECTED.  The SARS-CoV-2 RNA is generally detectable in upper and lower respiratory specimens during the acute phase of infection. The lowest concentration of SARS-CoV-2 viral copies this assay can detect is 250 copies / mL. A negative result does not preclude SARS-CoV-2 infection and should not be used as the sole basis for treatment or other patient management decisions.  A negative result may occur  with improper specimen collection / handling, submission of specimen other than nasopharyngeal swab, presence of viral mutation(s) within the areas targeted by this assay, and inadequate number of viral copies (<250 copies / mL). A negative result must be combined with clinical observations, patient history, and epidemiological information.  Fact Sheet for Patients:   StrictlyIdeas.no  Fact Sheet for Healthcare Providers: BankingDealers.co.za  This test is not yet approved or  cleared by the [place] FDA and has been authorized for detection and/or diagnosis of SARS-CoV-2 by FDA under an Emergency Use Authorization (EUA).  This EUA will remain in effect (meaning this test can be used) for the duration of the COVID-19 declaration under Section 564(b)(1) of the Act, 21 U.S.C. section 360bbb-3(b)(1), unless the authorization is terminated or revoked sooner.  Performed at [facility], [address], [place], [place]  [address]      Time coordinating discharge: 35 minutes  SIGNED:   Rodena Goldmann, DO [facility] 11/16/2019, 10:47 AM  If 7PM-7AM, please contact night-coverage [email or web address]

## 2019-11-16 NOTE — Plan of Care (Signed)
  Problem: Education: Goal: Knowledge of General Education information will improve Description: Including pain rating scale, medication(s)/side effects and non-pharmacologic comfort measures Outcome: Adequate for Discharge   Problem: Health Behavior/Discharge Planning: Goal: Ability to manage health-related needs will improve Outcome: Adequate for Discharge   Problem: Clinical Measurements: Goal: Ability to maintain clinical measurements within normal limits will improve Outcome: Adequate for Discharge Goal: Will remain free from infection Outcome: Adequate for Discharge Goal: Diagnostic test results will improve Outcome: Adequate for Discharge Goal: Respiratory complications will improve Outcome: Adequate for Discharge Goal: Cardiovascular complication will be avoided Outcome: Adequate for Discharge   Problem: Activity: Goal: Risk for activity intolerance will decrease Outcome: Adequate for Discharge   Problem: Nutrition: Goal: Adequate nutrition will be maintained Outcome: Adequate for Discharge   Problem: Coping: Goal: Level of anxiety will decrease Outcome: Adequate for Discharge   Problem: Elimination: Goal: Will not experience complications related to bowel motility Outcome: Adequate for Discharge Goal: Will not experience complications related to urinary retention Outcome: Adequate for Discharge   Problem: Pain Managment: Goal: General experience of comfort will improve Outcome: Adequate for Discharge   Problem: Safety: Goal: Ability to remain free from injury will improve Outcome: Adequate for Discharge   Problem: Skin Integrity: Goal: Risk for impaired skin integrity will decrease Outcome: Adequate for Discharge   Problem: Education: Goal: Knowledge of Pancreatitis treatment and prevention will improve Outcome: Adequate for Discharge   Problem: Health Behavior/Discharge Planning: Goal: Ability to formulate a plan to maintain an alcohol-free life will  improve Outcome: Adequate for Discharge   Problem: Nutritional: Goal: Ability to achieve adequate nutritional intake will improve Outcome: Adequate for Discharge   Problem: Clinical Measurements: Goal: Complications related to the disease process, condition or treatment will be avoided or minimized Outcome: Adequate for Discharge   

## 2019-11-20 ENCOUNTER — Telehealth: Payer: Self-pay

## 2019-11-20 NOTE — Telephone Encounter (Signed)
Pt was hospitalized at [facility] for 1 week and was discharged on Friday 11/16/19. Pt was asked to schedule an apt with a Gastroenterologist. Please advise on apt. I'm not sure if a referral is needed.

## 2019-11-20 NOTE — Telephone Encounter (Signed)
PATIENT SEEN IN HOSPITAL  NEW PATIENT REFERRAL

## 2019-11-20 NOTE — Telephone Encounter (Signed)
He is a patient of Dr.Outlaw. He last saw him on 09/20/18 and has been seeing him since 2015.

## 2019-11-21 ENCOUNTER — Ambulatory Visit (INDEPENDENT_AMBULATORY_CARE_PROVIDER_SITE_OTHER): Payer: Medicare Other | Admitting: Family Medicine

## 2019-11-21 ENCOUNTER — Other Ambulatory Visit: Payer: Self-pay

## 2019-11-21 VITALS — BP 124/88 | Temp 98.0°F | Ht 73.0 in | Wt 192.6 lb

## 2019-11-21 DIAGNOSIS — N410 Acute prostatitis: Secondary | ICD-10-CM | POA: Diagnosis not present

## 2019-11-21 DIAGNOSIS — I714 Abdominal aortic aneurysm, without rupture, unspecified: Secondary | ICD-10-CM

## 2019-11-21 DIAGNOSIS — I2510 Atherosclerotic heart disease of native coronary artery without angina pectoris: Secondary | ICD-10-CM | POA: Diagnosis not present

## 2019-11-21 DIAGNOSIS — R109 Unspecified abdominal pain: Secondary | ICD-10-CM

## 2019-11-21 DIAGNOSIS — K8500 Idiopathic acute pancreatitis without necrosis or infection: Secondary | ICD-10-CM

## 2019-11-21 LAB — POCT URINALYSIS DIPSTICK
Spec Grav, UA: <=1.005 — AB (ref 1.010–1.025)
pH, UA: 5 (ref 5.0–8.0)

## 2019-11-21 MED ORDER — SULFAMETHOXAZOLE-TRIMETHOPRIM 800-160 MG PO TABS: 1.0000 | ORAL_TABLET | Freq: Two times a day (BID) | ORAL | 0 refills | Status: DC

## 2019-11-21 NOTE — Progress Notes (Signed)
   Subjective:    Patient ID: [name] [name], male    DOB: 1940/11/17, 79 y.o.   MRN: [id number]  HPI hospital follow up. Pt states pain is gone but he is having chills every day for about 4 weeks. Still having nausea and weakness.  Had pain was admitted  Records reviewed Did ct  and MRI Labs showed pancreswtitis Was on strong pain meds and nausea meds Some dysuria Some chills daily Last weds and thur with severe chills and felt like fever. occas sweats  Results for orders placed or performed in visit on 11/21/19  POCT urinalysis dipstick  Result Value Ref Range   Color, UA     Clarity, UA     Glucose, UA     Bilirubin, UA     Ketones, UA     Spec Grav, UA <=1.005 (A) 1.010 - 1.025   Blood, UA     pH, UA 5.0 5.0 - 8.0   Protein, UA     Urobilinogen, UA     Nitrite, UA     Leukocytes, UA     Appearance     Odor       Review of Systems  Constitutional: Negative for diaphoresis and fatigue.  HENT: Negative for congestion and rhinorrhea.   Respiratory: Negative for cough and shortness of breath.   Cardiovascular: Negative for chest pain and leg swelling.  Gastrointestinal: Negative for abdominal pain and diarrhea.  Skin: Negative for color change and rash.  Neurological: Negative for dizziness and headaches.  Psychiatric/Behavioral: Negative for behavioral problems and confusion.       Objective:   Physical Exam Vitals reviewed.  Constitutional:      General: He is not in acute distress. HENT:     Head: Normocephalic and atraumatic.  Eyes:     General:        Right eye: No discharge.        Left eye: No discharge.  Neck:     Trachea: No tracheal deviation.  Cardiovascular:     Rate and Rhythm: Normal rate and regular rhythm.     Heart sounds: Normal heart sounds. No murmur heard.   Pulmonary:     Effort: Pulmonary effort is normal. No respiratory distress.     Breath sounds: Normal breath sounds.  Lymphadenopathy:     Cervical: No cervical adenopathy.    Skin:    General: Skin is warm and dry.  Neurological:     Mental Status: He is alert.     Coordination: Coordination normal.  Psychiatric:        Behavior: Behavior normal.    Prostate enlarged moderate tenderness       Assessment & Plan:  Prostatitis Antibiotic prescribed warning signs discussed follow-up if problems Pancreatitis resolved no need for any of additional lab work Aneurysm is followed by cardiology on a yearly basis Warning signs were discussed in detail regarding infection If high fevers or worse go to ER If ongoing troubles follow-up with [place] Recheck in 3 to 4 weeks

## 2019-11-21 NOTE — Patient Instructions (Signed)
Prostatitis  Prostatitis is swelling of the prostate gland. The prostate helps to make semen. It is below a man's bladder, in front of the rectum. There are different types of prostatitis. Follow these instructions at home:   Take over-the-counter and prescription medicines only as told by your doctor.  If you were prescribed an antibiotic medicine, take it as told by your doctor. Do not stop taking the antibiotic even if you start to feel better.  If your doctor prescribed exercises, do them as directed.  Take sitz baths as told by your doctor. To take a sitz bath, sit in warm water that is deep enough to cover your hips and butt.  Keep all follow-up visits as told by your doctor. This is important. Contact a doctor if:  Your symptoms get worse.  You have a fever. Get help right away if:  You have chills.  You feel sick to your stomach (nauseous).  You throw up (vomit).  You feel light-headed.  You feel like you might pass out (faint).  You cannot pee (urinate).  You have blood or clumps of blood (blood clots) in your pee (urine). This information is not intended to replace advice given to you by your health care provider. Make sure you discuss any questions you have with your health care provider. Document Revised: 04/15/2017 Document Reviewed: 01/22/2016 Elsevier Patient Education  2020 [employer] Inc[employer]  

## 2019-11-28 DIAGNOSIS — H43811 Vitreous degeneration, right eye: Secondary | ICD-10-CM | POA: Diagnosis not present

## 2019-11-28 DIAGNOSIS — H35372 Puckering of macula, left eye: Secondary | ICD-10-CM | POA: Diagnosis not present

## 2019-12-03 ENCOUNTER — Ambulatory Visit: Payer: Medicare Other | Admitting: Dermatology

## 2019-12-07 DIAGNOSIS — D72829 Elevated white blood cell count, unspecified: Secondary | ICD-10-CM | POA: Diagnosis not present

## 2019-12-07 DIAGNOSIS — K8500 Idiopathic acute pancreatitis without necrosis or infection: Secondary | ICD-10-CM | POA: Diagnosis not present

## 2019-12-12 ENCOUNTER — Telehealth: Payer: Self-pay | Admitting: Family Medicine

## 2019-12-12 NOTE — Telephone Encounter (Signed)
Patient was seen 11/21/19 with prostate infection and given antibiotic Bactrim but it has came back. Patient is requesting different antibiotic call into [place]. Please advise

## 2019-12-12 NOTE — Telephone Encounter (Signed)
Pt needs to see urology if it's returning.  Pls give referral. Thx. Dr. Lovena Le

## 2019-12-13 NOTE — Telephone Encounter (Signed)
Lmtc

## 2019-12-14 ENCOUNTER — Ambulatory Visit (INDEPENDENT_AMBULATORY_CARE_PROVIDER_SITE_OTHER): Payer: Medicare Other | Admitting: Family Medicine

## 2019-12-14 ENCOUNTER — Other Ambulatory Visit: Payer: Self-pay

## 2019-12-14 ENCOUNTER — Encounter: Payer: Self-pay | Admitting: Family Medicine

## 2019-12-14 VITALS — BP 128/78 | HR 53 | Temp 97.3°F | Wt 191.0 lb

## 2019-12-14 DIAGNOSIS — R300 Dysuria: Secondary | ICD-10-CM

## 2019-12-14 DIAGNOSIS — N401 Enlarged prostate with lower urinary tract symptoms: Secondary | ICD-10-CM | POA: Diagnosis not present

## 2019-12-14 DIAGNOSIS — R350 Frequency of micturition: Secondary | ICD-10-CM

## 2019-12-14 LAB — POCT URINALYSIS DIPSTICK
Protein, UA: POSITIVE — AB
Spec Grav, UA: 1.02 (ref 1.010–1.025)
pH, UA: 6 (ref 5.0–8.0)

## 2019-12-14 NOTE — Telephone Encounter (Signed)
Patient seen in the office today.

## 2019-12-14 NOTE — Progress Notes (Signed)
Patient ID: [name], male    DOB: May 18, 1940, 79 y.o.   MRN: [id number]   Chief Complaint  Patient presents with  . Dysuria   Subjective:    HPI   Pt here today for "prostate infection." Pt has had burning with urination and some frequency for 4-5 days. Pt has the same issues in the first of July. Pt was hospitalized for pancreatitis. Pt saw Dr.Scott on 11/21/19 and was informed of enlarged prostate. Pt has seen a GI for pancreatitis. Pt was prescribed Bactrim. Pt was having chills every day before hospitalization. No chills since July 7.  Having chills and pain in flank bilaterally prior to hospitalization. Dx with pancreatitis and lipase 5,228. Had burning urination with this also.  Saw Dr. Nicki Reaper and saw he has enlarged prostate. No fever, just chills. He checked prostate gland and gave 21 days bactrim, stopped having chills, then 4-5 days ago having pain with urination, burning. Stronger urgency at the time before his pancreatitis attack.  Saw GI doctor last week and gave him a list of meds that can cause pancreatitis.  Has seen urology in past.  Flomax given in past, stopped due to runny nose symptoms.  Pt hasn't been on the flomax.  Seen the urologist about 6 mo ago.  1-2 x per night. urgency bad a few months ago.  But now just occ urgency.  No fever, chills, abd pain, n/v/d. No hematuria.  Occ burning with urination. Dec stream and some dribbling.  UTI in past in 2016.    Medical History [name] [name] has a past medical history of AAA (abdominal aortic aneurysm) ([place]), Allergy, Anemia, Arthritis, Asthma, BCC (basal cell carcinoma) (08/18/1989), BCC (basal cell carcinoma) (01/31/1992), BCC (basal cell carcinoma) (11/22/2001), BCC (basal cell carcinoma) (10/09/2003), BCC (basal cell carcinoma) (08/15/2008), BPH (benign prostatic hyperplasia), CAD (coronary artery disease), Cancer ([place]), COPD (chronic obstructive pulmonary disease) ([place]), Dysrhythmia, GERD (gastroesophageal  reflux disease), Glaucoma, HOH (hard of hearing), Hypercholesterolemia, Hypertension, Impaired fasting glucose, Low back pain, Melanoma in situ ([place]) (10/09/2003), MI (myocardial infarction) ([place]) (1999), SCC (squamous cell carcinoma) (07/03/2014), SCC (squamous cell carcinoma) (07/03/2014), SCC (squamous cell carcinoma) (07/20/2017), SCC (squamous cell carcinoma) (01/10/2019), SCC (squamous cell carcinoma) (11/22/2001), SCC (squamous cell carcinoma) (10/09/2003), SCC (squamous cell carcinoma) (03/30/2004), SCC (squamous cell carcinoma) (03/08/2005), SCC (squamous cell carcinoma) (06/08/2006), SCC (squamous cell carcinoma) (05/05/2010), SCC (squamous cell carcinoma) (09/13/2013), and Thrush.   Outpatient Encounter Medications as of 12/14/2019  Medication Sig  . albuterol (PROVENTIL) (2.5 MG/3ML) 0.083% nebulizer solution INHALE 1 VIAL VIA NEBULIZER FOUR TIMES DAILY (Patient taking differently: Take 2.5 mg by nebulization every 6 (six) hours as needed for wheezing or shortness of breath. )  . cetirizine (ZYRTEC) 10 MG tablet Take 10 mg by mouth daily.  Marland Kitchen docusate sodium (COLACE) 100 MG capsule Take 1 capsule (100 mg total) by mouth daily as needed for mild constipation or moderate constipation.  . dorzolamide-timolol (COSOPT) 22.3-6.8 MG/ML ophthalmic solution Place 1 drop into both eyes 2 (two) times daily.   Marland Kitchen esomeprazole (NEXIUM) 20 MG capsule Take 20 mg by mouth daily at 12 noon.  . fluticasone (FLOVENT HFA) 220 MCG/ACT inhaler Inhale 2 puffs into the lungs 2 (two) times daily.  Marland Kitchen LORazepam (ATIVAN) 1 MG tablet Take 0.5 tablets (0.5 mg total) by mouth at bedtime.  Marland Kitchen losartan (COZAAR) 25 MG tablet Take 1 tablet (25 mg total) by mouth daily.  Marland Kitchen lovastatin (MEVACOR) 40 MG tablet Take 1 tablet (40 mg total)  by mouth daily.  . Multiple Vitamin (MULTIVITAMIN) tablet Take 1 tablet by mouth daily.   . nitroGLYCERIN (NITROSTAT) 0.4 MG SL tablet Place 1 tablet (0.4 mg total) under the tongue every 5 (five)  minutes as needed for chest pain.  Marland Kitchen nystatin (MYCOSTATIN) 100000 UNIT/ML suspension Take 5 mLs by mouth every 6 (six) hours.   . ondansetron (ZOFRAN) 4 MG tablet Take 1 tablet (4 mg total) by mouth every 6 (six) hours as needed for nausea.  Marland Kitchen PROAIR HFA 108 (90 Base) MCG/ACT inhaler INHALE 2 PUFFS INTO THE LUNGS EVERY SIX HOURS AS NEEDED FOR WHEEZING.  . PROLENSA 0.07 % SOLN Place 1 drop into the left eye daily.  . [DISCONTINUED] sulfamethoxazole-trimethoprim (BACTRIM DS) 800-160 MG tablet Take 1 tablet by mouth 2 (two) times daily.   No facility-administered encounter medications on file as of 12/14/2019.     Review of Systems  Constitutional: Negative for chills and fever.  HENT: Negative for congestion, rhinorrhea and sore throat.   Respiratory: Negative for cough, shortness of breath and wheezing.   Cardiovascular: Negative for chest pain and leg swelling.  Gastrointestinal: Negative for abdominal pain, diarrhea, nausea and vomiting.  Genitourinary: Positive for dysuria, frequency and urgency. Negative for difficulty urinating, discharge, enuresis, flank pain, genital sores and hematuria.  Skin: Negative for rash.  Neurological: Negative for dizziness, weakness and headaches.     Vitals BP 128/78   Pulse 53   Temp (!) 97.3 F (36.3 C)   Wt 191 lb (86.6 kg)   SpO2 (!) 88%   BMI 25.20 kg/m   Objective:   Physical Exam Vitals and nursing note reviewed.  Constitutional:      General: He is not in acute distress.    Appearance: Normal appearance. He is not ill-appearing.  Cardiovascular:     Rate and Rhythm: Normal rate and regular rhythm.     Pulses: Normal pulses.     Heart sounds: Normal heart sounds.  Pulmonary:     Effort: Pulmonary effort is normal. No respiratory distress.     Breath sounds: Normal breath sounds.  Abdominal:     General: Abdomen is flat. Bowel sounds are normal. There is no distension.     Palpations: Abdomen is soft. There is no mass.      Tenderness: There is no abdominal tenderness. There is no guarding or rebound.     Hernia: No hernia is present.  Musculoskeletal:        General: Normal range of motion.  Skin:    General: Skin is warm and dry.     Findings: No rash.  Neurological:     General: No focal deficit present.     Mental Status: He is alert and oriented to person, place, and time.  Psychiatric:        Mood and Affect: Mood normal.        Behavior: Behavior normal.        Thought Content: Thought content normal.        Judgment: Judgment normal.      Assessment and Plan   1. Benign prostatic hyperplasia with urinary frequency  2. Burning with urination - POCT Urinalysis Dipstick - Urine Culture   Pt to restart the flomax at dinner time with food.  This will help with urgency and dysuria.  UA-leuk and bilirubin. Will send for urine culture.  Will call if culture and add abx if needed. Pt to f/u with urology. Pt to call for appt.  F/u prn.

## 2019-12-18 DIAGNOSIS — R040 Epistaxis: Secondary | ICD-10-CM | POA: Diagnosis not present

## 2019-12-19 LAB — URINE CULTURE

## 2019-12-20 ENCOUNTER — Telehealth: Payer: Self-pay | Admitting: Family Medicine

## 2019-12-20 ENCOUNTER — Other Ambulatory Visit: Payer: Self-pay | Admitting: *Deleted

## 2019-12-20 MED ORDER — NITROFURANTOIN MONOHYD MACRO 100 MG PO CAPS
100.0000 mg | ORAL_CAPSULE | Freq: Two times a day (BID) | ORAL | 0 refills | Status: DC
Start: 2019-12-20 — End: 2020-01-08

## 2019-12-20 NOTE — Telephone Encounter (Signed)
Discussed with pt. Pt verbalized understanding. And med sent to pharm.

## 2019-12-20 NOTE — Telephone Encounter (Signed)
Urine culture did show infection Dr. Lovena Le sent message not sure where it went Macrobid 100 mg twice daily for 7 days should take care of the problem.  If ongoing troubles or worse notify us.  Recommend repeat urine when the patient comes back to see Dr. Lovena Le in a week

## 2019-12-20 NOTE — Telephone Encounter (Signed)
Patient would like results of urine test from 7/30.

## 2019-12-21 ENCOUNTER — Telehealth: Payer: Self-pay | Admitting: Family Medicine

## 2019-12-21 NOTE — Telephone Encounter (Signed)
Patient was prescribe nitrofurantoin 100 mg on 8/5 and now his face is broke out in a rash. Please advise

## 2019-12-21 NOTE — Telephone Encounter (Signed)
Left message to return call 

## 2019-12-21 NOTE — Telephone Encounter (Signed)
Called pt back since he did not return call and he states as the day went by he now thinks it is just a flare up of rosacea and does not want anything at this time. States no hives, no sob, no swelling in lips or face and he states if anything changes he will follow up with [place], urgent care or ED if sob.

## 2019-12-28 ENCOUNTER — Encounter: Payer: Self-pay | Admitting: Family Medicine

## 2019-12-28 ENCOUNTER — Emergency Department (HOSPITAL_COMMUNITY): Payer: Medicare Other

## 2019-12-28 ENCOUNTER — Emergency Department (HOSPITAL_COMMUNITY)
Admission: EM | Admit: 2019-12-28 | Discharge: 2019-12-28 | Disposition: A | Discharge status: Home / Self Care | Payer: Medicare Other | Attending: Emergency Medicine | Admitting: Emergency Medicine

## 2019-12-28 ENCOUNTER — Other Ambulatory Visit: Payer: Self-pay

## 2019-12-28 ENCOUNTER — Encounter (HOSPITAL_COMMUNITY): Payer: Self-pay

## 2019-12-28 ENCOUNTER — Ambulatory Visit (INDEPENDENT_AMBULATORY_CARE_PROVIDER_SITE_OTHER): Payer: Medicare Other | Admitting: Family Medicine

## 2019-12-28 VITALS — BP 148/92 | HR 79 | Temp 97.8°F

## 2019-12-28 DIAGNOSIS — I2510 Atherosclerotic heart disease of native coronary artery without angina pectoris: Secondary | ICD-10-CM | POA: Insufficient documentation

## 2019-12-28 DIAGNOSIS — R2681 Unsteadiness on feet: Secondary | ICD-10-CM

## 2019-12-28 DIAGNOSIS — R42 Dizziness and giddiness: Secondary | ICD-10-CM | POA: Insufficient documentation

## 2019-12-28 DIAGNOSIS — J45909 Unspecified asthma, uncomplicated: Secondary | ICD-10-CM | POA: Diagnosis not present

## 2019-12-28 DIAGNOSIS — R29818 Other symptoms and signs involving the nervous system: Secondary | ICD-10-CM | POA: Diagnosis not present

## 2019-12-28 DIAGNOSIS — Z85828 Personal history of other malignant neoplasm of skin: Secondary | ICD-10-CM | POA: Insufficient documentation

## 2019-12-28 DIAGNOSIS — I10 Essential (primary) hypertension: Secondary | ICD-10-CM | POA: Diagnosis not present

## 2019-12-28 DIAGNOSIS — I6782 Cerebral ischemia: Secondary | ICD-10-CM | POA: Diagnosis not present

## 2019-12-28 DIAGNOSIS — Z87891 Personal history of nicotine dependence: Secondary | ICD-10-CM | POA: Insufficient documentation

## 2019-12-28 DIAGNOSIS — Z20822 Contact with and (suspected) exposure to covid-19: Secondary | ICD-10-CM | POA: Diagnosis not present

## 2019-12-28 DIAGNOSIS — R270 Ataxia, unspecified: Secondary | ICD-10-CM | POA: Diagnosis not present

## 2019-12-28 DIAGNOSIS — G319 Degenerative disease of nervous system, unspecified: Secondary | ICD-10-CM | POA: Diagnosis not present

## 2019-12-28 DIAGNOSIS — R630 Anorexia: Secondary | ICD-10-CM | POA: Diagnosis not present

## 2019-12-28 DIAGNOSIS — R5383 Other fatigue: Secondary | ICD-10-CM | POA: Diagnosis not present

## 2019-12-28 DIAGNOSIS — Z7951 Long term (current) use of inhaled steroids: Secondary | ICD-10-CM | POA: Diagnosis not present

## 2019-12-28 DIAGNOSIS — Z79899 Other long term (current) drug therapy: Secondary | ICD-10-CM | POA: Diagnosis not present

## 2019-12-28 DIAGNOSIS — R82998 Other abnormal findings in urine: Secondary | ICD-10-CM | POA: Diagnosis not present

## 2019-12-28 LAB — COMPREHENSIVE METABOLIC PANEL
ALT: 19 U/L (ref 0–44)
AST: 21 U/L (ref 15–41)
Albumin: 3.9 g/dL (ref 3.5–5.0)
Alkaline Phosphatase: 58 U/L (ref 38–126)
Anion gap: 7 (ref 5–15)
BUN: 13 mg/dL (ref 8–23)
CO2: 29 mmol/L (ref 22–32)
Calcium: 9 mg/dL (ref 8.9–10.3)
Chloride: 102 mmol/L (ref 98–111)
Creatinine, Ser: 0.76 mg/dL (ref 0.61–1.24)
GFR calc Af Amer: >60 mL/min (ref >60)
GFR calc non Af Amer: >60 mL/min (ref >60)
Glucose, Bld: 115 mg/dL — ABNORMAL HIGH (ref 70–99)
Potassium: 3.9 mmol/L (ref 3.5–5.1)
Sodium: 138 mmol/L (ref 135–145)
Total Bilirubin: 0.8 mg/dL (ref 0.3–1.2)
Total Protein: 7.5 g/dL (ref 6.5–8.1)

## 2019-12-28 LAB — CBC WITH DIFFERENTIAL/PLATELET
Abs Immature Granulocytes: 0.02 10*3/uL (ref 0.00–0.07)
Basophils Absolute: 0.1 10*3/uL (ref 0.0–0.1)
Basophils Relative: 1 %
Eosinophils Absolute: 0.2 10*3/uL (ref 0.0–0.5)
Eosinophils Relative: 3 %
HCT: 42.7 % (ref 39.0–52.0)
Hemoglobin: 13.5 g/dL (ref 13.0–17.0)
Immature Granulocytes: 0 %
Lymphocytes Relative: 20 %
Lymphs Abs: 1.4 10*3/uL (ref 0.7–4.0)
MCH: 28.7 pg (ref 26.0–34.0)
MCHC: 31.6 g/dL (ref 30.0–36.0)
MCV: 90.7 fL (ref 80.0–100.0)
Monocytes Absolute: 0.5 10*3/uL (ref 0.1–1.0)
Monocytes Relative: 8 %
Neutro Abs: 4.7 10*3/uL (ref 1.7–7.7)
Neutrophils Relative %: 68 %
Platelets: 231 10*3/uL (ref 150–400)
RBC: 4.71 MIL/uL (ref 4.22–5.81)
RDW: 14.1 % (ref 11.5–15.5)
WBC: 6.9 10*3/uL (ref 4.0–10.5)
nRBC: 0 % (ref 0.0–0.2)

## 2019-12-28 LAB — URINALYSIS, ROUTINE W REFLEX MICROSCOPIC
Bilirubin Urine: NEGATIVE
Glucose, UA: NEGATIVE mg/dL
Hgb urine dipstick: NEGATIVE
Ketones, ur: NEGATIVE mg/dL
Leukocytes,Ua: NEGATIVE
Nitrite: NEGATIVE
Protein, ur: NEGATIVE mg/dL
Specific Gravity, Urine: 1.005 (ref 1.005–1.030)
pH: 8 (ref 5.0–8.0)

## 2019-12-28 LAB — SARS CORONAVIRUS 2 BY RT PCR (HOSPITAL ORDER, PERFORMED IN ~~LOC~~ HOSPITAL LAB): SARS Coronavirus 2: NEGATIVE

## 2019-12-28 LAB — TROPONIN I (HIGH SENSITIVITY): Troponin I (High Sensitivity): 4 ng/L (ref <18)

## 2019-12-28 LAB — LIPASE, BLOOD: Lipase: 54 U/L — ABNORMAL HIGH (ref 11–51)

## 2019-12-28 MED ORDER — MECLIZINE HCL 25 MG PO TABS: 25.0000 mg | ORAL_TABLET | Freq: Three times a day (TID) | ORAL | 0 refills | Status: DC | PRN

## 2019-12-28 MED ORDER — MECLIZINE HCL 12.5 MG PO TABS
25.0000 mg | ORAL_TABLET | Freq: Once | ORAL | Status: AC
  Administered 2019-12-28: 25 mg via ORAL
  Filled 2019-12-28: qty 2

## 2019-12-28 MED ORDER — SODIUM CHLORIDE 0.9 % IV BOLUS
1000.0000 mL | Freq: Once | INTRAVENOUS | Status: AC
  Administered 2019-12-28: 1000 mL via INTRAVENOUS

## 2019-12-28 NOTE — ED Triage Notes (Signed)
Pt brought by EMS from [facility]. He reports that he has been dizzy since 1300. Pt reports he was watching TV and became dizzy. Felt off balance, Pt drove himself to his doctor appt but then sent to ED for eval. Pt reports he has had a dull HA for 22 weeks

## 2019-12-28 NOTE — Discharge Instructions (Addendum)
You were seen in the ER for an episode of dizziness and imbalance that began today.  Your symptoms gradually improved since their onset.  Your MRI at this time did not show any signs of stroke in your brain.  Your blood work and urine samples did not show any evidence of infection, or any cardiac or heart events.  Please follow-up with your primary care clinic in 3 days.  I expect your symptoms to gradually get better.  If you notice new symptoms, including slurred speech, facial droop, numbness or weakness in your arms or legs, or sudden worsening headache, you need to return to the ER immediately.  Likewise, if you has any sudden severe abdominal pain or lower back pain, or sudden lightheadedness or loss of consciousness, you need to return to the ER.  This may be signs of worsening injury with your abdominal aorta.  I felt it was reasonable to discharge you home and prescribe you some Antivert, which is a medicine that may help with vertigo.

## 2019-12-28 NOTE — Progress Notes (Signed)
Patient ID: [name] [name], male    DOB: 05-17-1941, 79 y.o.   MRN: [id number]   Chief Complaint  Patient presents with  . Hospitalization Follow-up    pancreatitis. Patient states he hasn't felt normal since he was in the hospital. Today before coming here all of a sudden felt dizzy and wobbly on his feet but drove himself to the office. Felt like he was going to pass out coming in the door and now says his head feels "crazy".   Subjective:    HPI Pt seen for f/u of hospitalization from pancreatitis. Feeling dizzy today around 1pm while watching tv and off balance and came to office to be examined.  Once in the room, feeling vertigo and head feeling funny. No chest pain or weakness/numbness in limbs.  Medical History [name] [name] has a past medical history of AAA (abdominal aortic aneurysm) ([place]), Allergy, Anemia, Arthritis, Asthma, BCC (basal cell carcinoma) (08/18/1989), BCC (basal cell carcinoma) (01/31/1992), BCC (basal cell carcinoma) (11/22/2001), BCC (basal cell carcinoma) (10/09/2003), BCC (basal cell carcinoma) (08/15/2008), BPH (benign prostatic hyperplasia), CAD (coronary artery disease), Cancer ([place]), COPD (chronic obstructive pulmonary disease) ([place]), Dysrhythmia, GERD (gastroesophageal reflux disease), Glaucoma, HOH (hard of hearing), Hypercholesterolemia, Hypertension, Impaired fasting glucose, Low back pain, Melanoma in situ ([place]) (10/09/2003), MI (myocardial infarction) ([place]) (1999), SCC (squamous cell carcinoma) (07/03/2014), SCC (squamous cell carcinoma) (07/03/2014), SCC (squamous cell carcinoma) (07/20/2017), SCC (squamous cell carcinoma) (01/10/2019), SCC (squamous cell carcinoma) (11/22/2001), SCC (squamous cell carcinoma) (10/09/2003), SCC (squamous cell carcinoma) (03/30/2004), SCC (squamous cell carcinoma) (03/08/2005), SCC (squamous cell carcinoma) (06/08/2006), SCC (squamous cell carcinoma) (05/05/2010), SCC (squamous cell carcinoma) (09/13/2013), and Thrush.   Outpatient  Encounter Medications as of 12/28/2019  Medication Sig  . albuterol (PROVENTIL) (2.5 MG/3ML) 0.083% nebulizer solution INHALE 1 VIAL VIA NEBULIZER FOUR TIMES DAILY (Patient taking differently: Take 2.5 mg by nebulization every 6 (six) hours as needed for wheezing or shortness of breath. )  . cetirizine (ZYRTEC) 10 MG tablet Take 10 mg by mouth daily.  Marland Kitchen docusate sodium (COLACE) 100 MG capsule Take 1 capsule (100 mg total) by mouth daily as needed for mild constipation or moderate constipation.  . dorzolamide-timolol (COSOPT) 22.3-6.8 MG/ML ophthalmic solution Place 1 drop into both eyes 2 (two) times daily.   Marland Kitchen esomeprazole (NEXIUM) 20 MG capsule Take 20 mg by mouth daily at 12 noon.  . fluticasone (FLOVENT HFA) 220 MCG/ACT inhaler Inhale 2 puffs into the lungs 2 (two) times daily.  Marland Kitchen LORazepam (ATIVAN) 1 MG tablet Take 0.5 tablets (0.5 mg total) by mouth at bedtime.  Marland Kitchen losartan (COZAAR) 25 MG tablet Take 1 tablet (25 mg total) by mouth daily.  Marland Kitchen lovastatin (MEVACOR) 40 MG tablet Take 1 tablet (40 mg total) by mouth daily.  . Multiple Vitamin (MULTIVITAMIN) tablet Take 1 tablet by mouth daily.   . nitroGLYCERIN (NITROSTAT) 0.4 MG SL tablet Place 1 tablet (0.4 mg total) under the tongue every 5 (five) minutes as needed for chest pain.  Marland Kitchen PROAIR HFA 108 (90 Base) MCG/ACT inhaler INHALE 2 PUFFS INTO THE LUNGS EVERY SIX HOURS AS NEEDED FOR WHEEZING.  . PROLENSA 0.07 % SOLN Place 1 drop into the left eye daily.  . [DISCONTINUED] nitrofurantoin, macrocrystal-monohydrate, (MACROBID) 100 MG capsule Take 1 capsule (100 mg total) by mouth 2 (two) times daily.  . [DISCONTINUED] nystatin (MYCOSTATIN) 100000 UNIT/ML suspension Take 5 mLs by mouth every 6 (six) hours.   . [DISCONTINUED] ondansetron (ZOFRAN) 4 MG tablet Take 1 tablet (4 mg total)  by mouth every 6 (six) hours as needed for nausea.   No facility-administered encounter medications on file as of 12/28/2019.     Review of Systems  Constitutional:  Negative for chills and fever.  HENT: Negative for congestion, rhinorrhea and sore throat.   Respiratory: Negative for cough, shortness of breath and wheezing.   Cardiovascular: Negative for chest pain and leg swelling.  Gastrointestinal: Negative for abdominal pain, diarrhea, nausea and vomiting.  Genitourinary: Negative for dysuria and frequency.  Skin: Negative for rash.  Neurological: Positive for dizziness. Negative for tremors, speech difficulty, weakness, numbness and headaches.     Vitals BP (!) 148/92   Pulse 79   Temp 97.8 F (36.6 C)   SpO2 98%   Objective:   Physical Exam Vitals and nursing note reviewed.  Constitutional:      General: He is not in acute distress.    Appearance: Normal appearance. He is not ill-appearing.  HENT:     Head: Normocephalic.     Nose: Nose normal. No congestion.     Mouth/Throat:     Mouth: Mucous membranes are moist.     Pharynx: No oropharyngeal exudate.  Eyes:     Extraocular Movements: Extraocular movements intact.     Conjunctiva/sclera: Conjunctivae normal.     Pupils: Pupils are equal, round, and reactive to light.  Cardiovascular:     Rate and Rhythm: Normal rate and regular rhythm.     Pulses: Normal pulses.     Heart sounds: Normal heart sounds. No murmur heard.   Pulmonary:     Effort: Pulmonary effort is normal.     Breath sounds: Normal breath sounds. No wheezing, rhonchi or rales.  Musculoskeletal:        General: Normal range of motion.     Right lower leg: No edema.     Left lower leg: No edema.  Skin:    General: Skin is warm and dry.     Findings: No rash.  Neurological:     General: No focal deficit present.     Mental Status: He is alert and oriented to person, place, and time.     Cranial Nerves: No cranial nerve deficit.     Sensory: No sensory deficit.     Motor: No weakness.     Gait: Gait abnormal.     Comments: +difficulty with walking and needing to be wheeled back in wheelchair.  Dizziness  when sitting from laying position.  Psychiatric:        Mood and Affect: Mood normal.        Behavior: Behavior normal.        Thought Content: Thought content normal.        Judgment: Judgment normal.      Assessment and Plan   1. Dizziness  2. Unsteady gait   Pt advised to go to ER for further evaluation and possible imaging of head.  Pt unable to walk due to unsteady gait and dizziness. EMS called and pt brought to ER.  Pt in agreement.  F/u after ER evaluation.

## 2019-12-28 NOTE — ED Provider Notes (Signed)
[facility] EMERGENCY DEPARTMENT Provider Note   CSN: [id number] Arrival date & time: 12/28/19  1443     History Chief Complaint  Patient presents with  . Dizziness    [name] [name] is a 79 y.o. male with a history of AAA (4 cm on June 2021 CT imaging), history of hospitalization for pancreatitis, history of prostatitis, presented to emergency department with acute onset of imbalance and dizziness.  Patient reports that he was watching television earlier today around 1 PM and began to feel off balance and dizzy.  Specifically he felt like when he stood up he could not walk in a straight line and was staggering.  He went to her doctor's appointments told to come to the ED for evaluation.  He says this does not feel like vertigo, which he has had one episode many years ago.  He does report a dull headache in the front of his forehead which has been throbbing for about a week.  It has not gotten any better or worse in that time.  He denies any prior history of stroke.  He reports he has high cholesterol high blood pressure.  He also reports he had one heart attack in the nineteen nineties.  He has been vaccinated for Covid.  He was in the hospital about 1 month ago being treated for pancreatitis, with unclear origin.  His CT scan at that time showed acute pancreatitis without evidence of peripancreatic fluid collection or pancreatic necrosis.  He says his abdominal pain and appetite has significantly improved since then.  He is not feeling a recurrence of those symptoms.  He is also being treated by his PCP for prostatitis.  He recently completed a course of Macrobid.  He continues having some urinary hesitancy.  Per medical records, his urine culture from July 30 of this year showed Enterococcus faecalis greater than 100,000 units, susceptible to nitrofurantoin.  This is what the patient has been prescribed and is taking now.  HPI     Past Medical History:  Diagnosis Date  . AAA  (abdominal aortic aneurysm) ([place])    needs yearly ultrasound  . Allergy   . Anemia   . Arthritis   . Asthma   . BCC (basal cell carcinoma) 08/18/1989   left shoulder blad, upper right arm, left arm beyond elbow, c&d  . BCC (basal cell carcinoma) 01/31/1992   Posterior neck, curetx3, 23fu  . BCC (basal cell carcinoma) 11/22/2001   mid forehead, cx3, excision, right forearm, cx3, 81fu  . BCC (basal cell carcinoma) 10/09/2003   mid forehead, MOHs  . BCC (basal cell carcinoma) 08/15/2008   upper left back, biopsy  . BPH (benign prostatic hyperplasia)   . CAD (coronary artery disease)   . Cancer ([place])    skin cancer  . COPD (chronic obstructive pulmonary disease) ([place])   . Dysrhythmia    pt. states it can be fast at times  . GERD (gastroesophageal reflux disease)   . Glaucoma   . HOH (hard of hearing)   . Hypercholesterolemia   . Hypertension   . Impaired fasting glucose   . Low back pain   . Melanoma in situ ([place]) 10/09/2003   left chin, MOHs  . MI (myocardial infarction) ([place]) 1999  . SCC (squamous cell carcinoma) 07/03/2014   in situ, behind left ear, cx3, cautery, 79fu  . SCC (squamous cell carcinoma) 07/03/2014   well diff, left forearm, biopsy, cx1, cautery  . SCC (squamous cell carcinoma) 07/20/2017  in situ, left upper arm, cx3, 64fu  . SCC (squamous cell carcinoma) 01/10/2019   in situ, left post shoulder, cx3, 70fu  . SCC (squamous cell carcinoma) 11/22/2001   left forearm distal, left forearm, cx3, 29fu  . SCC (squamous cell carcinoma) 10/09/2003   [place], left ear post, clear per st, right cheek clear  . SCC (squamous cell carcinoma) 03/30/2004   in situ, left upper arm, cx3, 52fu  . SCC (squamous cell carcinoma) 03/08/2005   in situ, right cheek, mid upper forehead, cx3, 64fu  . SCC (squamous cell carcinoma) 06/08/2006   in situ, left shoulder, cx3, 68fu  . SCC (squamous cell carcinoma) 05/05/2010   right inner wrist, biopsy  . SCC (squamous cell carcinoma)  09/13/2013   in situ, right crown scalp, front scalp, biopsy  . Thrush     Patient Active Problem List   Diagnosis Date Noted  . Pancreatitis 11/11/2019  . HOH (hard of hearing)   . Recurrent epistaxis 06/01/2017  . Pleuritic chest pain 09/16/2016  . HTN (hypertension) 09/16/2016  . Herpes zoster without complication 24/01/7352  . Oral candidiasis 04/01/2016  . Prostate hypertrophy 03/06/2016  . Depression 04/06/2015  . Ulnar neuropathy 12/14/2014  . Leg swelling 12/05/2014  . Neuropathic pain 12/05/2014  . S/P spinal surgery 12/05/2014  . Venous stasis dermatitis 12/05/2014  . Insomnia 12/05/2014  . Generalized pain   . UTI (lower urinary tract infection) 11/11/2014  . Thrush of mouth and esophagus ([place]) 11/11/2014  . Urinary retention   . Urinary tract infectious disease   . Lumbar spondylosis 11/05/2014  . Colon neoplasm 06/11/2013  . GERD (gastroesophageal reflux disease) 01/19/2012  . PAC (premature atrial contraction) 09/15/2010  . Mixed hyperlipidemia 04/15/2008  . BEN HTN HEART DISEASE WITHOUT HEART FAIL 04/15/2008  . Coronary atherosclerosis 04/15/2008  . Abdominal aortic aneurysm ([place]) 04/15/2008  . COPD (chronic obstructive pulmonary disease) ([place]) 04/15/2008  . History of MI (myocardial infarction) 1999    Past Surgical History:  Procedure Laterality Date  . BACK SURGERY     x 3  . CARDIAC CATHETERIZATION     angioplasty  . CATARACT EXTRACTION W/PHACO  03/20/2012   Procedure: CATARACT EXTRACTION PHACO AND INTRAOCULAR LENS PLACEMENT (IOC);  Surgeon: Williams Che, MD;  Location: [facility];  Service: Ophthalmology;  Laterality: Right;  CDE:  8.45  . CATARACT EXTRACTION W/PHACO Left 04/02/2013   Procedure: CATARACT EXTRACTION PHACO AND INTRAOCULAR LENS PLACEMENT (IOC);  Surgeon: Williams Che, MD;  Location: [facility];  Service: Ophthalmology;  Laterality: Left;  CDE:  6.50  . CHOLECYSTECTOMY  2000  . COLONOSCOPY  2009   repeat 5 years  .  ESOPHAGOGASTRODUODENOSCOPY    . HERNIA REPAIR Left    inguinal  . LAPAROSCOPIC PARTIAL COLECTOMY N/A 06/11/2013   Procedure: LAPAROSCOPIC HAND ASSISTED PARTIAL COLECTOMY;  Surgeon: Jamesetta So, MD;  Location: [facility];  Service: General;  Laterality: N/A;  . right eye detached retina Bilateral   . SPINAL FUSION  2016  . YAG LASER APPLICATION Left 29/92/4268   Procedure: YAG LASER APPLICATION;  Surgeon: Williams Che, MD;  Location: [facility];  Service: Ophthalmology;  Laterality: Left;       Family History  Problem Relation Age of Onset  . Hypertension Mother   . COPD Father   . Cancer Brother        brain    Social History   Tobacco Use  . Smoking status: Former Smoker    Packs/day: 1.00  Years: 35.00    Pack years: 35.00    Types: Cigarettes    Quit date: 03/28/2003    Years since quitting: 16.7  . Smokeless tobacco: Never Used  Vaping Use  . Vaping Use: Never used  Substance Use Topics  . Alcohol use: No    Alcohol/week: 0.0 standard drinks  . Drug use: No    Home Medications Prior to Admission medications   Medication Sig Start Date End Date Taking? Authorizing Provider  albuterol (PROVENTIL) (2.5 MG/3ML) 0.083% nebulizer solution INHALE 1 VIAL VIA NEBULIZER FOUR TIMES DAILY Patient taking differently: Take 2.5 mg by nebulization every 6 (six) hours as needed for wheezing or shortness of breath.  10/11/19   Mikey Kirschner, MD  cetirizine (ZYRTEC) 10 MG tablet Take 10 mg by mouth daily.    [provider]  docusate sodium (COLACE) 100 MG capsule Take 1 capsule (100 mg total) by mouth daily as needed for mild constipation or moderate constipation. 11/16/19   Manuella Ghazi, Pratik D, DO  dorzolamide-timolol (COSOPT) 22.3-6.8 MG/ML ophthalmic solution Place 1 drop into both eyes 2 (two) times daily.  09/25/19   [provider]  esomeprazole (NEXIUM) 20 MG capsule Take 20 mg by mouth daily at 12 noon.    [provider]  fluticasone (FLOVENT HFA) 220  MCG/ACT inhaler Inhale 2 puffs into the lungs 2 (two) times daily.    [provider]  LORazepam (ATIVAN) 1 MG tablet Take 0.5 tablets (0.5 mg total) by mouth at bedtime. 08/13/19   Mikey Kirschner, MD  losartan (COZAAR) 25 MG tablet Take 1 tablet (25 mg total) by mouth daily. 08/13/19   Mikey Kirschner, MD  lovastatin (MEVACOR) 40 MG tablet Take 1 tablet (40 mg total) by mouth daily. 08/13/19   Mikey Kirschner, MD  meclizine (ANTIVERT) 25 MG tablet Take 1 tablet (25 mg total) by mouth 3 (three) times daily as needed for up to 15 doses for dizziness or nausea. 12/28/19   Wyvonnia Dusky, MD  Multiple Vitamin (MULTIVITAMIN) tablet Take 1 tablet by mouth daily.     [provider]  nitrofurantoin, macrocrystal-monohydrate, (MACROBID) 100 MG capsule Take 1 capsule (100 mg total) by mouth 2 (two) times daily. 12/20/19   Kathyrn Drown, MD  nitroGLYCERIN (NITROSTAT) 0.4 MG SL tablet Place 1 tablet (0.4 mg total) under the tongue every 5 (five) minutes as needed for chest pain. 02/26/19   Josue Hector, MD  nystatin (MYCOSTATIN) 100000 UNIT/ML suspension Take 5 mLs by mouth every 6 (six) hours.  10/19/19   [provider]  ondansetron (ZOFRAN) 4 MG tablet Take 1 tablet (4 mg total) by mouth every 6 (six) hours as needed for nausea. 11/16/19   Manuella Ghazi, Pratik D, DO  PROAIR HFA 108 (90 Base) MCG/ACT inhaler INHALE 2 PUFFS INTO THE LUNGS EVERY SIX HOURS AS NEEDED FOR WHEEZING. 01/05/19   Mikey Kirschner, MD  PROLENSA 0.07 % SOLN Place 1 drop into the left eye daily. 09/26/19   [provider]    Allergies    Lasix [furosemide], Cefzil [cefprozil], Dexamethasone, Gabapentin, Neomycin, Tetracyclines & related, Ciprofloxacin, Methocarbamol, and Penicillins  Review of Systems   Review of Systems  Constitutional: Positive for appetite change and fatigue. Negative for fever.  HENT: Negative for ear pain and sore throat.   Eyes: Negative for photophobia, pain, redness and  visual disturbance.  Respiratory: Negative for cough and shortness of breath.   Cardiovascular: Negative for chest pain and  palpitations.  Gastrointestinal: Negative for abdominal pain and vomiting.  Genitourinary: Positive for difficulty urinating. Negative for hematuria.  Musculoskeletal: Negative for arthralgias and back pain.  Skin: Negative for color change and rash.  Neurological: Positive for dizziness and light-headedness. Negative for syncope, facial asymmetry, speech difficulty, weakness and numbness.  Psychiatric/Behavioral: Negative for agitation and confusion.  All other systems reviewed and are negative.   Physical Exam Updated Vital Signs BP (!) 161/96 (BP Location: Right Arm)   Pulse 69   Temp 98.5 F (36.9 C) (Oral)   Resp 16   Wt 86 kg   SpO2 97%   BMI 25.01 kg/m   Physical Exam Vitals and nursing note reviewed.  Constitutional:      Appearance: He is well-developed.  HENT:     Head: Normocephalic and atraumatic.  Eyes:     General: No visual field deficit.    Conjunctiva/sclera: Conjunctivae normal.  Cardiovascular:     Rate and Rhythm: Normal rate and regular rhythm.     Pulses: Normal pulses.  Pulmonary:     Effort: Pulmonary effort is normal. No respiratory distress.     Breath sounds: Normal breath sounds.  Abdominal:     Palpations: Abdomen is soft.     Tenderness: There is no abdominal tenderness.  Musculoskeletal:     Cervical back: Neck supple.  Skin:    General: Skin is warm and dry.  Neurological:     General: No focal deficit present.     Mental Status: He is alert and oriented to person, place, and time.     GCS: GCS eye subscore is 4. GCS verbal subscore is 5. GCS motor subscore is 6.     Cranial Nerves: Cranial nerves are intact. No cranial nerve deficit, dysarthria or facial asymmetry.     Sensory: No sensory deficit.     Motor: No weakness.     Coordination: Romberg sign positive. Finger-Nose-Finger Test normal.     Gait: Gait  abnormal.     Comments: No nystagmus on exam No active vertigo during exam Positive Romberg test, mildly ataxic gait  Psychiatric:        Mood and Affect: Mood normal.        Behavior: Behavior normal.     ED Results / Procedures / Treatments   Labs (all labs ordered are listed, but only abnormal results are displayed) Labs Reviewed  URINALYSIS, ROUTINE W REFLEX MICROSCOPIC - Abnormal; Notable for the following components:      Result Value   Color, Urine STRAW (*)    All other components within normal limits  COMPREHENSIVE METABOLIC PANEL - Abnormal; Notable for the following components:   Glucose, Bld 115 (*)    All other components within normal limits  LIPASE, BLOOD - Abnormal; Notable for the following components:   Lipase 54 (*)    All other components within normal limits  SARS CORONAVIRUS 2 BY RT PCR (HOSPITAL ORDER, [facility])  CBC WITH DIFFERENTIAL/PLATELET  TROPONIN I (HIGH SENSITIVITY)    EKG EKG Interpretation  Date/Time:  Friday December 28 2019 15:00:35 EDT Ventricular Rate:  74 PR Interval:  146 QRS Duration: 74 QT Interval:  394 QTC Calculation: 437 R Axis:   66 Text Interpretation: Normal sinus rhythm Septal infarct , age undetermined Abnormal ECG No STEMI Confirmed by Octaviano Glow [phone]) on 12/28/2019 3:29:35 PM   Radiology DG Chest 2 View  Result Date: 12/28/2019 CLINICAL DATA:  Pt [facility] by EMS from  [facility]. He reports that he has been dizzy since 1300. Pt reports he was watching TV and became dizzy. Felt off balance, Pt drove himself to his doctor appt but then sent to ED for eval. Pt reports he has had a dull HA for 22 weeks EXAM: CHEST - 2 VIEW COMPARISON:  11/13/2019 FINDINGS: Cardiac silhouette is normal in size. No mediastinal or hilar masses. No evidence of adenopathy. Lungs are hyperexpanded, but clear. Eventrated anterior left hemidiaphragm is stable. No pleural effusion or pneumothorax.  Skeletal structures are intact. IMPRESSION: No acute cardiopulmonary disease. Electronically Signed   By: Lajean Manes M.D.   On: 12/28/2019 16:23   MR BRAIN WO CONTRAST  Result Date: 12/28/2019 CLINICAL DATA:  Acute neuro deficit.  Ataxia. EXAM: MRI HEAD WITHOUT CONTRAST TECHNIQUE: Multiplanar, multiecho pulse sequences of the brain and surrounding structures were obtained without intravenous contrast. COMPARISON:  MRI head 06/15/2012 FINDINGS: Brain: Negative for acute infarct. Mild atrophy and moderate chronic microvascular ischemic changes in the white matter. Negative for hemorrhage mass or edema. Vascular: Normal arterial flow voids. Skull and upper cervical spine: No focal skeletal lesion. Sinuses/Orbits: Paranasal sinuses clear. Bilateral cataract extraction and right scleral banding. Other: None IMPRESSION: Negative for acute infarct Moderate chronic microvascular ischemic change in the white matter with progression since 2014. Electronically Signed   By: Franchot Gallo M.D.   On: 12/28/2019 17:05    Procedures Procedures (including critical care time)  Medications Ordered in ED Medications  sodium chloride 0.9 % bolus 1,000 mL (0 mLs Intravenous Stopped 12/28/19 1809)  meclizine (ANTIVERT) tablet 25 mg (25 mg Oral Given 12/28/19 1733)    ED Course  I have reviewed the triage vital signs and the nursing notes.  Pertinent labs & imaging results that were available during my care of the patient were reviewed by me and considered in my medical decision making (see chart for details).  79 yo male here with episodic vertigo and balance difficulty, now improving  His neurological exam is benign except some mild ataxia on gait.    DDx includes ACS vs dehydration vs infection vs posterior stroke vs peripheral vertigo vs viral illness vs other  Given the abruptness of his symptom onset, I felt an MRI brain was reasonable to evaluate for posterior stroke.  No evidence of this reported on MRI  scan.    ACS workup negative - trop 4, ECG shows no acute ischemia per my interpretation  Covid is negative Lipase near baseline, at 54 today  No anemia or infection on his workup today.  He felt improved during his stay and was reasonably safe for discharge.     Clinical Course as of Dec 28 133  Fri Dec 28, 2019  1708 IMPRESSION: Negative for acute infarct Moderate chronic microvascular ischemic change in the white matter with progression since 2014.   [MT]  1726 Reviewed the patient's work-up with him.  Do not see signs of a stroke.  His vital signs and blood work were all within normal limits.  He says he has been steadily improving and feeling better since the onset of symptoms several hours ago.  His gait appears more steady.  He prefers to go home and I think this is reasonable.  I will prescribe him Antivert for home.  It is a fairly low suspicion at this time that this is related to his AAA in his stomach, which was imaged only 2 months ago, and he has no abdominal pain or  hypotension at this time.  I gave close return precautions   [MT]    Clinical Course User Index [MT] Gina Leblond, Carola Rhine, MD    Final Clinical Impression(s) / ED Diagnoses Final diagnoses:  Dizziness    Rx / DC Orders ED Discharge Orders         Ordered    meclizine (ANTIVERT) 25 MG tablet  3 times daily PRN     Discontinue  Reprint     12/28/19 1729           Wyvonnia Dusky, MD 12/29/19 0139

## 2020-01-08 ENCOUNTER — Encounter: Payer: Self-pay | Admitting: Urology

## 2020-01-08 ENCOUNTER — Ambulatory Visit (INDEPENDENT_AMBULATORY_CARE_PROVIDER_SITE_OTHER): Payer: Medicare Other | Admitting: Urology

## 2020-01-08 ENCOUNTER — Other Ambulatory Visit: Payer: Self-pay

## 2020-01-08 VITALS — BP 125/72 | HR 81 | Temp 98.2°F | Ht 73.0 in | Wt 189.6 lb

## 2020-01-08 DIAGNOSIS — R351 Nocturia: Secondary | ICD-10-CM | POA: Insufficient documentation

## 2020-01-08 DIAGNOSIS — N401 Enlarged prostate with lower urinary tract symptoms: Secondary | ICD-10-CM | POA: Diagnosis not present

## 2020-01-08 DIAGNOSIS — I2510 Atherosclerotic heart disease of native coronary artery without angina pectoris: Secondary | ICD-10-CM

## 2020-01-08 LAB — URINALYSIS, ROUTINE W REFLEX MICROSCOPIC
Bilirubin, UA: NEGATIVE
Glucose, UA: NEGATIVE
Ketones, UA: NEGATIVE
Nitrite, UA: NEGATIVE
Protein,UA: NEGATIVE
Specific Gravity, UA: 1.02 (ref 1.005–1.030)
Urobilinogen, Ur: 0.2 mg/dL (ref 0.2–1.0)
pH, UA: 7 (ref 5.0–7.5)

## 2020-01-08 LAB — MICROSCOPIC EXAMINATION
Bacteria, UA: NONE SEEN
Epithelial Cells (non renal): NONE SEEN /hpf (ref 0–10)
Renal Epithel, UA: NONE SEEN /hpf

## 2020-01-08 MED ORDER — ALFUZOSIN HCL ER 10 MG PO TB24: 10.0000 mg | ORAL_TABLET | Freq: Every day | ORAL | 11 refills | Status: DC

## 2020-01-08 NOTE — Progress Notes (Signed)
Urological Symptom Review  Patient is experiencing the following symptoms: Get up at night to urinate Leakage of urine Urinary tract infection   Review of Systems  Gastrointestinal (upper)  : Negative for upper GI symptoms  Gastrointestinal (lower) : Negative for lower GI symptoms  Constitutional : Fatigue  Skin: Negative for skin symptoms  Eyes: Blurred vision  Ear/Nose/Throat : Sinus problems  Hematologic/Lymphatic: Easy bruising  Cardiovascular : Negative for cardiovascular symptoms  Respiratory : Shortness of breath  Endocrine: Negative for endocrine symptoms  Musculoskeletal: Back pain Joint pain  Neurological: Dizziness  Psychologic: Negative for psychiatric symptoms

## 2020-01-08 NOTE — Patient Instructions (Signed)

## 2020-01-08 NOTE — Progress Notes (Signed)
01/08/2020 4:29 PM   [name] [name] 1941/04/04 [id number]  Referring provider: Erven Colla, DO [name],   [id number]  Nocturia  HPI: [name] is a 79yo here for followup for BPH and nocturia. IN JUne 2021 he developed dysuria and chills. He was diagnosed with prostatitis and treated with 3 weeks of bactrim which resolved the chills. He had a urine culture on 7/30 which grew enterococcus. He was then treated with macrobid. UA today is normal. He has tried flomax and rapaflo which caused nasal congestion  His records from [facility] are as follows:  I have symptoms of an enlarged prostate.  HPI: [name] [name] is a 79 year-old male established patient who is here for symptoms of enlarged prostate.  He first noticed the symptoms approximately 05/17/2013. His symptoms have not gotten worse over the last year. He has been treated with Flomax. The patient has never had a surgical procedure for bladder outlet obstruction to his prostate.   He usually gets up at night to urinate 2 times. He does have to wait a long time to start his urinary stream. He does not have to strain or bear down to start his urinary stream. He does not have a good size and strength to his urinary stream. He does not dribble at the end of urination. He is having problems with emptying his bladder well.   His urine has shut off completely. He has previously had an indwelling catheter in for more than two weeks at a time.   He has had a PSA done. He does not have any family members who have been diagnosed with prostate cancer.   05/26/2016: He is s/p L5-S1 PLIF on 11/05/14. He developed post op suprapubic discomfort & difficulty emptying his bladder. He had some relief with Tamsulosin. He was placed on Bactrim in addition to cephalexin, for a presumed UTI after surgery.  He has not been seen in 18 months. He gets a UTI once per year.  He was treated for prostatitis 2 months ago by Dr. Wolfgang Phoenix. He was also prescribed  flomax which he stopped due to nasal congestion.  He also has associated urgency, weak stream.  Pt has had normal PSAs.    09/22/2018: He has had 2 prostate infection since last visit. He stopped flomax due to nasal congestion. He has worsening urinary frequency, urgency, and nocturia. He has a feeling of incomplete emptying.   06/19/2019: Patient has stable LUTS on rapaflo 8mg  daily. He has noted worsening Bubbles in his urine and has had multiple UAs with protein. No dysuiria or hematuria. He takes 600-1200mg  of motrin daily   07/17/2019: He has stable LUTS on rapaflo 8mg  daily. Stream is strong. nocturia 1-2x. He did a 24hr protein collection which was elevated at 219.     CC: I get up too often at night to urinate.  HPI: He first noticed the symptom approximately 09/14/2012. He usually gets up at night to urinate 2 times. He does not have nights when he does not get up to urinate at all. He does not have trouble falling back asleep once he has been woken up at night.   He does not usually have swelling in his hands and feet during the day. He does not take a diuretic. He does not have to strain or bear down to start his urinary stream.   09/22/2018: He has noted worsening nocturia since last visit.   06/19/2019: Nocturia stable at 2x on rapaflo  07/17/2019: Nocturia improved to 1-2x on rapaflo.     AUA Symptom Score: 50% of the time he has the sensation of not emptying his bladder completely when finished urinating. 50% of the time he has to urinate again fewer than two hours after he has finished urinating. Less than 50% of the time he has to start and stop again several times when he urinates. More than 50% of the time he finds it difficult to postpone urination. More than 50% of the time he has a weak urinary stream. Less than 50% of the time he has to push or strain to begin urination. He has to get up to urinate 1 time from the time he goes to bed until the time he gets up in the morning.    Calculated AUA Symptom Score: 19      PMH: Past Medical History:  Diagnosis Date  . AAA (abdominal aortic aneurysm) ([place])    needs yearly ultrasound  . Allergy   . Anemia   . Arthritis   . Asthma   . BCC (basal cell carcinoma) 08/18/1989   left shoulder blad, upper right arm, left arm beyond elbow, c&d  . BCC (basal cell carcinoma) 01/31/1992   Posterior neck, curetx3, 82fu  . BCC (basal cell carcinoma) 11/22/2001   mid forehead, cx3, excision, right forearm, cx3, 75fu  . BCC (basal cell carcinoma) 10/09/2003   mid forehead, MOHs  . BCC (basal cell carcinoma) 08/15/2008   upper left back, biopsy  . BPH (benign prostatic hyperplasia)   . CAD (coronary artery disease)   . Cancer ([place])    skin cancer  . COPD (chronic obstructive pulmonary disease) ([place])   . Dysrhythmia    pt. states it can be fast at times  . GERD (gastroesophageal reflux disease)   . Glaucoma   . HOH (hard of hearing)   . Hypercholesterolemia   . Hypertension   . Impaired fasting glucose   . Low back pain   . Melanoma in situ ([place]) 10/09/2003   left chin, MOHs  . MI (myocardial infarction) ([place]) 1999  . SCC (squamous cell carcinoma) 07/03/2014   in situ, behind left ear, cx3, cautery, 73fu  . SCC (squamous cell carcinoma) 07/03/2014   well diff, left forearm, biopsy, cx1, cautery  . SCC (squamous cell carcinoma) 07/20/2017   in situ, left upper arm, cx3, 2fu  . SCC (squamous cell carcinoma) 01/10/2019   in situ, left post shoulder, cx3, 54fu  . SCC (squamous cell carcinoma) 11/22/2001   left forearm distal, left forearm, cx3, 57fu  . SCC (squamous cell carcinoma) 10/09/2003   [place], left ear post, clear per st, right cheek clear  . SCC (squamous cell carcinoma) 03/30/2004   in situ, left upper arm, cx3, 74fu  . SCC (squamous cell carcinoma) 03/08/2005   in situ, right cheek, mid upper forehead, cx3, 44fu  . SCC (squamous cell carcinoma) 06/08/2006   in situ, left shoulder, cx3, 22fu  . SCC  (squamous cell carcinoma) 05/05/2010   right inner wrist, biopsy  . SCC (squamous cell carcinoma) 09/13/2013   in situ, right crown scalp, front scalp, biopsy  . Thrush     Surgical History: Past Surgical History:  Procedure Laterality Date  . BACK SURGERY     x 3  . CARDIAC CATHETERIZATION     angioplasty  . CATARACT EXTRACTION W/PHACO  03/20/2012   Procedure: CATARACT EXTRACTION PHACO AND INTRAOCULAR LENS PLACEMENT ([facility]);  Surgeon: Williams Che, MD;  Location:  [facility];  Service: Ophthalmology;  Laterality: Right;  CDE:  8.45  . CATARACT EXTRACTION W/PHACO Left 04/02/2013   Procedure: CATARACT EXTRACTION PHACO AND INTRAOCULAR LENS PLACEMENT ([facility]);  Surgeon: Williams Che, MD;  Location: [facility];  Service: Ophthalmology;  Laterality: Left;  CDE:  6.50  . CHOLECYSTECTOMY  2000  . COLONOSCOPY  2009   repeat 5 years  . ESOPHAGOGASTRODUODENOSCOPY    . HERNIA REPAIR Left    inguinal  . LAPAROSCOPIC PARTIAL COLECTOMY N/A 06/11/2013   Procedure: LAPAROSCOPIC HAND ASSISTED PARTIAL COLECTOMY;  Surgeon: Jamesetta So, MD;  Location: [facility];  Service: General;  Laterality: N/A;  . right eye detached retina Bilateral   . SPINAL FUSION  2016  . YAG LASER APPLICATION Left [id number]/[id number]   Procedure: YAG LASER APPLICATION;  Surgeon: Williams Che, MD;  Location: [facility];  Service: Ophthalmology;  Laterality: Left;    Home Medications:  Allergies as of 01/08/2020      Reactions   Lasix [furosemide] Rash   Cefzil [cefprozil] Nausea Only   Dexamethasone Swelling   Gabapentin    Legs swell up    Neomycin Other (See Comments)   Patient can't remember.   Tetracyclines & Related Itching   Ciprofloxacin Nausea And Vomiting, Rash, Other (See Comments)   Body aches   Methocarbamol Swelling, Rash   Penicillins Rash   Has patient had a PCN reaction causing immediate rash, facial/tongue/throat swelling, SOB or lightheadedness with hypotension: yes Has patient had a PCN reaction causing severe  rash involving mucus membranes or skin necrosis: unknown Has patient had a PCN reaction that required hospitalization: no Has patient had a PCN reaction occurring within the last 10 years: No If all of the above answers are "NO", then may proceed with Cephalosporin use.      Medication List       Accurate as of January 08, 2020  4:29 PM. If you have any questions, ask your nurse or doctor.        STOP taking these medications   nitrofurantoin (macrocrystal-monohydrate) 100 MG capsule Commonly known as: Macrobid Stopped by: Nicolette Bang, MD   nystatin 100000 UNIT/ML suspension Commonly known as: MYCOSTATIN Stopped by: Nicolette Bang, MD   ondansetron 4 MG tablet Commonly known as: ZOFRAN Stopped by: Nicolette Bang, MD     TAKE these medications   cetirizine 10 MG tablet Commonly known as: ZYRTEC Take 10 mg by mouth daily.   docusate sodium 100 MG capsule Commonly known as: COLACE Take 1 capsule (100 mg total) by mouth daily as needed for mild constipation or moderate constipation.   dorzolamide-timolol 22.3-6.8 MG/ML ophthalmic solution Commonly known as: COSOPT Place 1 drop into both eyes 2 (two) times daily.   esomeprazole 20 MG capsule Commonly known as: NEXIUM Take 20 mg by mouth daily at 12 noon.   fluticasone 220 MCG/ACT inhaler Commonly known as: FLOVENT HFA Inhale 2 puffs into the lungs 2 (two) times daily.   LORazepam 1 MG tablet Commonly known as: ATIVAN Take 0.5 tablets (0.5 mg total) by mouth at bedtime.   losartan 25 MG tablet Commonly known as: COZAAR Take 1 tablet (25 mg total) by mouth daily.   lovastatin 40 MG tablet Commonly known as: MEVACOR Take 1 tablet (40 mg total) by mouth daily.   meclizine 25 MG tablet Commonly known as: ANTIVERT Take 1 tablet (25 mg total) by mouth 3 (three) times daily as needed for up to 15 doses for dizziness or nausea.  multivitamin tablet Take 1 tablet by mouth daily.   nitroGLYCERIN 0.4 MG SL  tablet Commonly known as: NITROSTAT Place 1 tablet (0.4 mg total) under the tongue every 5 (five) minutes as needed for chest pain.   ProAir HFA 108 (90 Base) MCG/ACT inhaler Generic drug: albuterol INHALE 2 PUFFS INTO THE LUNGS EVERY SIX HOURS AS NEEDED FOR WHEEZING. What changed: Another medication with the same name was changed. Make sure you understand how and when to take each.   albuterol (2.5 MG/3ML) 0.083% nebulizer solution Commonly known as: PROVENTIL INHALE 1 VIAL VIA NEBULIZER FOUR TIMES DAILY What changed: See the new instructions.   Prolensa 0.07 % Soln Generic drug: Bromfenac Sodium Place 1 drop into the left eye daily.       Allergies:  Allergies  Allergen Reactions  . Lasix [Furosemide] Rash  . Cefzil [Cefprozil] Nausea Only  . Dexamethasone Swelling  . Gabapentin     Legs swell up   . Neomycin Other (See Comments)    Patient can't remember.  . Tetracyclines & Related Itching  . Ciprofloxacin Nausea And Vomiting, Rash and Other (See Comments)    Body aches   . Methocarbamol Swelling and Rash  . Penicillins Rash    Has patient had a PCN reaction causing immediate rash, facial/tongue/throat swelling, SOB or lightheadedness with hypotension: yes Has patient had a PCN reaction causing severe rash involving mucus membranes or skin necrosis: unknown Has patient had a PCN reaction that required hospitalization: no Has patient had a PCN reaction occurring within the last 10 years: No If all of the above answers are "NO", then may proceed with Cephalosporin use.     Family History: Family History  Problem Relation Age of Onset  . Hypertension Mother   . COPD Father   . Cancer Brother        brain    Social History:  reports that he quit smoking about 16 years ago. His smoking use included cigarettes. He has a 35.00 pack-year smoking history. He has never used smokeless tobacco. He reports that he does not drink alcohol and does not use drugs.  ROS: All  other review of systems were reviewed and are negative except what is noted above in HPI  Physical Exam: BP 125/72   Pulse 81   Temp 98.2 F (36.8 C)   Ht 6\' 1"  (1.854 m)   Wt 189 lb 9.6 oz (86 kg)   BMI 25.01 kg/m   Constitutional:  Alert and oriented, No acute distress. HEENT: [place] AT, moist mucus membranes.  Trachea midline, no masses. Cardiovascular: No clubbing, cyanosis, or edema. Respiratory: Normal respiratory effort, no increased work of breathing. GI: Abdomen is soft, nontender, nondistended, no abdominal masses GU: No CVA tenderness.  Lymph: No cervical or inguinal lymphadenopathy. Skin: No rashes, bruises or suspicious lesions. Neurologic: Grossly intact, no focal deficits, moving all 4 extremities. Psychiatric: Normal mood and affect.  Laboratory Data: Lab Results  Component Value Date   WBC 6.9 12/28/2019   HGB 13.5 12/28/2019   HCT 42.7 12/28/2019   MCV 90.7 12/28/2019   PLT 231 12/28/2019    Lab Results  Component Value Date   CREATININE 0.76 12/28/2019    Lab Results  Component Value Date   PSA 1.80 07/26/2014   PSA 1.42 03/26/2013    No results found for: TESTOSTERONE  No results found for: HGBA1C  Urinalysis    Component Value Date/Time   COLORURINE STRAW (A) 12/28/2019 1541   [place]  12/28/2019 1541   LABSPEC 1.005 12/28/2019 1541   PHURINE 8.0 12/28/2019 1541   GLUCOSEU NEGATIVE 12/28/2019 1541   HGBUR NEGATIVE 12/28/2019 [place] 12/28/2019 1541   BILIRUBINUR + 12/14/2019 0913   KETONESUR NEGATIVE 12/28/2019 1541   PROTEINUR NEGATIVE 12/28/2019 1541   UROBILINOGEN 2.0 03/05/2016 1544   UROBILINOGEN 0.2 12/04/2014 0240   NITRITE NEGATIVE 12/28/2019 1541   LEUKOCYTESUR NEGATIVE 12/28/2019 1541    Lab Results  Component Value Date   BACTERIA FEW (A) 11/11/2019    Pertinent Imaging: CT abd/pelvic 11/11/2019: Images reviewed and discussed with patient Results for orders placed during the hospital  encounter of 11/11/14  DG Abd 1 View  Narrative CLINICAL DATA:  Difficulty urinating with burning and pelvic pressure for 6 days.  EXAM: ABDOMEN - 1 VIEW  COMPARISON:  None.  FINDINGS: The bowel gas pattern is nonobstructive. Prominent stool burden in the transverse colon is noted. No unexpected abdominal calcification is seen. The patient is status post lower lumbar fusion.  IMPRESSION: No acute abnormality.   Electronically Signed By: Inge Rise M.D. On: 11/11/2014 17:46  Results for orders placed in visit on 12/06/14  US Venous Img Lower Bilateral  Narrative CLINICAL DATA:  One month post L5-S1 fusion, presenting with bilateral lower extremity edema and elevated D-dimer.  EXAM: BILATERAL LOWER EXTREMITY VENOUS DOPPLER ULTRASOUND  TECHNIQUE: Gray-scale sonography with graded compression, as well as color Doppler and duplex ultrasound were performed to evaluate the lower extremity deep venous systems from the level of the common femoral vein and including the common femoral, femoral, profunda femoral, popliteal and calf veins including the posterior tibial, peroneal and gastrocnemius veins when visible. The superficial great saphenous vein was also interrogated. Spectral Doppler was utilized to evaluate flow at rest and with distal augmentation maneuvers in the common femoral, femoral and popliteal veins.  COMPARISON:  None.  FINDINGS: RIGHT LOWER EXTREMITY  Common Femoral Vein: No evidence of thrombus. Normal compressibility, respiratory phasicity and response to augmentation.  Saphenofemoral Junction: No evidence of thrombus. Normal compressibility and flow on color Doppler imaging.  Profunda Femoral Vein: No evidence of thrombus. Normal compressibility and flow on color Doppler imaging.  Femoral Vein: No evidence of thrombus. Normal compressibility, respiratory phasicity and response to augmentation.  Popliteal Vein: No evidence of thrombus.  Normal compressibility, respiratory phasicity and response to augmentation.  Calf Veins: No evidence of thrombus. Normal compressibility and flow on color Doppler imaging.  Superficial Great Saphenous Vein: No evidence of thrombus. Normal compressibility and flow on color Doppler imaging.  Venous Reflux:  Not evaluated.  Other Findings:  None.  LEFT LOWER EXTREMITY  Common Femoral Vein: No evidence of thrombus. Normal compressibility, respiratory phasicity and response to augmentation.  Saphenofemoral Junction: No evidence of thrombus. Normal compressibility and flow on color Doppler imaging.  Profunda Femoral Vein: No evidence of thrombus. Normal compressibility and flow on color Doppler imaging.  Femoral Vein: No evidence of thrombus. Normal compressibility, respiratory phasicity and response to augmentation.  Popliteal Vein: No evidence of thrombus. Normal compressibility, respiratory phasicity and response to augmentation.  Calf Veins: No evidence of thrombus. Normal compressibility and flow on color Doppler imaging.  Superficial Great Saphenous Vein: No evidence of thrombus. Normal compressibility and flow on color Doppler imaging.  Venous Reflux:  Not evaluated.  Other Findings:  None.  IMPRESSION: No evidence of DVT involving the right or left lower extremity.   Electronically Signed By: Evangeline Dakin M.D. On: 12/06/2014 17:15  No  results found for this or any previous visit.  No results found for this or any previous visit.  No results found for this or any previous visit.  No results found for this or any previous visit.  No results found for this or any previous visit.  No results found for this or any previous visit.   Assessment & Plan:    1. BPH associated with nocturia -We will trial uroxatral. If he cannot tolerate the medication we will proceed with cystoscopy - Urinalysis, Routine w reflex microscopic  2. Nocturia Uroxatral 10mg   QHS   No follow-ups on file.  Nicolette Bang, MD  [facility] [place]

## 2020-01-14 ENCOUNTER — Telehealth: Payer: Self-pay

## 2020-01-21 ENCOUNTER — Encounter: Payer: Self-pay | Admitting: Family Medicine

## 2020-01-22 DIAGNOSIS — H04123 Dry eye syndrome of bilateral lacrimal glands: Secondary | ICD-10-CM | POA: Diagnosis not present

## 2020-01-22 DIAGNOSIS — H0102B Squamous blepharitis left eye, upper and lower eyelids: Secondary | ICD-10-CM | POA: Diagnosis not present

## 2020-01-22 DIAGNOSIS — H3581 Retinal edema: Secondary | ICD-10-CM | POA: Diagnosis not present

## 2020-01-22 DIAGNOSIS — Z9889 Other specified postprocedural states: Secondary | ICD-10-CM | POA: Diagnosis not present

## 2020-01-22 DIAGNOSIS — Z961 Presence of intraocular lens: Secondary | ICD-10-CM | POA: Diagnosis not present

## 2020-01-22 DIAGNOSIS — H401122 Primary open-angle glaucoma, left eye, moderate stage: Secondary | ICD-10-CM | POA: Diagnosis not present

## 2020-01-22 DIAGNOSIS — H0102A Squamous blepharitis right eye, upper and lower eyelids: Secondary | ICD-10-CM | POA: Diagnosis not present

## 2020-01-23 DIAGNOSIS — K8500 Idiopathic acute pancreatitis without necrosis or infection: Secondary | ICD-10-CM | POA: Diagnosis not present

## 2020-01-23 DIAGNOSIS — Z86010 Personal history of colonic polyps: Secondary | ICD-10-CM | POA: Diagnosis not present

## 2020-01-24 ENCOUNTER — Other Ambulatory Visit: Payer: Self-pay

## 2020-01-24 ENCOUNTER — Ambulatory Visit (INDEPENDENT_AMBULATORY_CARE_PROVIDER_SITE_OTHER): Payer: Medicare Other | Admitting: Family Medicine

## 2020-01-24 DIAGNOSIS — J441 Chronic obstructive pulmonary disease with (acute) exacerbation: Secondary | ICD-10-CM | POA: Diagnosis not present

## 2020-01-24 DIAGNOSIS — R059 Cough, unspecified: Secondary | ICD-10-CM

## 2020-01-24 DIAGNOSIS — I2510 Atherosclerotic heart disease of native coronary artery without angina pectoris: Secondary | ICD-10-CM

## 2020-01-24 DIAGNOSIS — R05 Cough: Secondary | ICD-10-CM

## 2020-01-24 MED ORDER — PREDNISONE 20 MG PO TABS: ORAL_TABLET | ORAL | 0 refills | Status: DC

## 2020-01-24 MED ORDER — SULFAMETHOXAZOLE-TRIMETHOPRIM 800-160 MG PO TABS: 1.0000 | ORAL_TABLET | Freq: Two times a day (BID) | ORAL | 0 refills | Status: DC

## 2020-01-24 NOTE — Progress Notes (Signed)
   Subjective:    Patient ID: [name] [name], male    DOB: 04/21/1941, 79 y.o.   MRN: [id number]  Cough This is a new problem. Episode onset: 2 weeks  The problem has been gradually worsening. The cough is productive of sputum. Associated symptoms comments: Chest tightness, soreness. He has tried OTC cough suppressant for the symptoms. The treatment provided no relief.   Patient with underlying history of COPD a lot of coughing congestion feels little short of breath at times   Review of Systems  Respiratory: Positive for cough.    See above    Objective:   Physical Exam Lungs are clear with some faint wheezing on the right side no respiratory distress O2 saturation 96% heart regular skin warm dry       Assessment & Plan:  COPD exacerbation Antibiotics prednisone Doubt Covid Covid test taken Warning signs discussed follow-up if ongoing troubles

## 2020-01-26 LAB — SARS-COV-2, NAA 2 DAY TAT

## 2020-01-26 LAB — NOVEL CORONAVIRUS, NAA: SARS-CoV-2, NAA: NOT DETECTED

## 2020-01-30 ENCOUNTER — Ambulatory Visit (INDEPENDENT_AMBULATORY_CARE_PROVIDER_SITE_OTHER): Payer: Medicare Other | Admitting: Dermatology

## 2020-01-30 ENCOUNTER — Other Ambulatory Visit: Payer: Self-pay

## 2020-01-30 ENCOUNTER — Encounter: Payer: Self-pay | Admitting: Dermatology

## 2020-01-30 DIAGNOSIS — X32XXXA Exposure to sunlight, initial encounter: Secondary | ICD-10-CM

## 2020-01-30 DIAGNOSIS — L570 Actinic keratosis: Secondary | ICD-10-CM

## 2020-01-30 DIAGNOSIS — L309 Dermatitis, unspecified: Secondary | ICD-10-CM

## 2020-01-30 DIAGNOSIS — Z1283 Encounter for screening for malignant neoplasm of skin: Secondary | ICD-10-CM

## 2020-01-30 DIAGNOSIS — L814 Other melanin hyperpigmentation: Secondary | ICD-10-CM

## 2020-01-30 DIAGNOSIS — I2510 Atherosclerotic heart disease of native coronary artery without angina pectoris: Secondary | ICD-10-CM | POA: Diagnosis not present

## 2020-01-30 MED ORDER — CLOBETASOL PROP EMOLLIENT BASE 0.05 % EX CREA: 1.0000 "application " | TOPICAL_CREAM | Freq: Every day | CUTANEOUS | 1 refills | Status: DC

## 2020-01-31 ENCOUNTER — Encounter: Payer: Self-pay | Admitting: Adult Health

## 2020-01-31 ENCOUNTER — Ambulatory Visit (INDEPENDENT_AMBULATORY_CARE_PROVIDER_SITE_OTHER): Payer: Medicare Other | Admitting: Adult Health

## 2020-01-31 DIAGNOSIS — I2510 Atherosclerotic heart disease of native coronary artery without angina pectoris: Secondary | ICD-10-CM

## 2020-01-31 DIAGNOSIS — J410 Simple chronic bronchitis: Secondary | ICD-10-CM

## 2020-01-31 MED ORDER — MOMETASONE FURO-FORMOTEROL FUM 200-5 MCG/ACT IN AERO: 2.0000 | INHALATION_SPRAY | Freq: Two times a day (BID) | RESPIRATORY_TRACT | 5 refills | Status: DC

## 2020-01-31 NOTE — Assessment & Plan Note (Signed)
Severe COPD with recent exacerbation.  Seems to be clinically improving with antibiotics and steroids.  Would like for patient to be on dual therapy.  He is unable to tolerate anticholinergic/LAMA due to his underlying glaucoma.  We will add in a LABA.  Change Flovent to [facility].  We will add a spacer as he gets airway irritation and throat hoarseness from ICS most likely.  Oral inhaler care discussed.  Plan  Patient Instructions  Finish steroids as directed.  Stop Flovent  Begin Dulera 2 puffs Twice daily  , brush/rinse/gargle after use. Eat yogurt daily (use with spacer)  Albuterol inhaler /neb As needed   Activity as tolerated.  Follow up with Dr. Ander Slade in 6-8 weeks and As needed   Please contact office for sooner follow up if symptoms do not improve or worsen or seek emergency care

## 2020-01-31 NOTE — Patient Instructions (Addendum)
Finish steroids as directed.  Stop Flovent  Begin Dulera 2 puffs Twice daily  , brush/rinse/gargle after use. Eat yogurt daily (use with spacer)  Albuterol inhaler /neb As needed   Activity as tolerated.  Follow up with Dr. Ander Slade in 6-8 weeks and As needed   Please contact office for sooner follow up if symptoms do not improve or worsen or seek emergency care

## 2020-01-31 NOTE — Progress Notes (Signed)
@Patient  ID: [name] [name], male    DOB: Nov 24, 1940, 79 y.o.   MRN: [id number]  Chief Complaint  Patient presents with  . Follow-up    Bronchitis     Referring provider: Erven Colla, DO  HPI: 79 year old male former smoker followed for COPD with chronic bronchitis and emphysema  TEST/EVENTS :  Chest x-ray December 28, 2019 showed no acute process   01/31/2020 Follow up; COPD  Patient presents for a follow-up visit, patient has a known history of COPD.  Last visit was changed from Flovent to [facility] however was unable to tolerate Anoro due to his underlying glaucoma.  He was switched back over to Flovent.  Patient complains over the last 2 weeks has had increased cough congestion and shortness of breath.  He was seen by his primary care provider and given an antibiotic and prednisone.  Patient says he is on his last day today.  He is feeling some better with decreased cough and congestion. Patient had pulmonary function testing done November 05, 2019 that showed severe airflow obstruction with FEV1 at 47%, ratio 61, FVC 56%, no significant bronchodilator response, mid flow obstruction with significant reversibility and normal DLCO. Patient denies any hemoptysis, chest pain, orthopnea or edema.   Allergies  Allergen Reactions  . Lasix [Furosemide] Rash  . Cefzil [Cefprozil] Nausea Only  . Dexamethasone Swelling  . Gabapentin     Legs swell up   . Neomycin Other (See Comments)    Patient can't remember.  . Tetracyclines & Related Itching  . Ciprofloxacin Nausea And Vomiting, Rash and Other (See Comments)    Body aches   . Methocarbamol Swelling and Rash  . Penicillins Rash    Has patient had a PCN reaction causing immediate rash, facial/tongue/throat swelling, SOB or lightheadedness with hypotension: yes Has patient had a PCN reaction causing severe rash involving mucus membranes or skin necrosis: unknown Has patient had a PCN reaction that required hospitalization: no Has  patient had a PCN reaction occurring within the last 10 years: No If all of the above answers are "NO", then may proceed with Cephalosporin use.     Immunization History  Administered Date(s) Administered  . Influenza,inj,Quad PF,6+ Mos 03/22/2014, 04/21/2015, 02/13/2019  . Influenza-Unspecified 02/18/2017, 03/23/2018  . Moderna SARS-COVID-2 Vaccination 06/20/2019, 07/18/2019  . Pneumococcal Conjugate-13 09/24/2016  . Pneumococcal Polysaccharide-23 05/08/2013  . Pneumococcal-Unspecified 10/24/2006  . Zoster Recombinat (Shingrix) 08/26/2016, 02/05/2017    Past Medical History:  Diagnosis Date  . AAA (abdominal aortic aneurysm) ([place])    needs yearly ultrasound  . Allergy   . Anemia   . Arthritis   . Asthma   . BCC (basal cell carcinoma) 08/18/1989   left shoulder blad, upper right arm, left arm beyond elbow, c&d  . BCC (basal cell carcinoma) 01/31/1992   Posterior neck, curetx3, 70fu  . BCC (basal cell carcinoma) 11/22/2001   mid forehead, cx3, excision, right forearm, cx3, 47fu  . BCC (basal cell carcinoma) 10/09/2003   mid forehead, MOHs  . BCC (basal cell carcinoma) 08/15/2008   upper left back, biopsy  . BPH (benign prostatic hyperplasia)   . CAD (coronary artery disease)   . Cancer ([place])    skin cancer  . COPD (chronic obstructive pulmonary disease) ([place])   . Dysrhythmia    pt. states it can be fast at times  . GERD (gastroesophageal reflux disease)   . Glaucoma   . HOH (hard of hearing)   . Hypercholesterolemia   . Hypertension   .  Impaired fasting glucose   . Low back pain   . Melanoma in situ ([place]) 10/09/2003   left chin, MOHs  . MI (myocardial infarction) ([place]) 1999  . SCC (squamous cell carcinoma) 07/03/2014   in situ, behind left ear, cx3, cautery, 80fu  . SCC (squamous cell carcinoma) 07/03/2014   well diff, left forearm, biopsy, cx1, cautery  . SCC (squamous cell carcinoma) 07/20/2017   in situ, left upper arm, cx3, 82fu  . SCC (squamous cell carcinoma)  01/10/2019   in situ, left post shoulder, cx3, 68fu  . SCC (squamous cell carcinoma) 11/22/2001   left forearm distal, left forearm, cx3, 36fu  . SCC (squamous cell carcinoma) 10/09/2003   [place], left ear post, clear per st, right cheek clear  . SCC (squamous cell carcinoma) 03/30/2004   in situ, left upper arm, cx3, 52fu  . SCC (squamous cell carcinoma) 03/08/2005   in situ, right cheek, mid upper forehead, cx3, 25fu  . SCC (squamous cell carcinoma) 06/08/2006   in situ, left shoulder, cx3, 38fu  . SCC (squamous cell carcinoma) 05/05/2010   right inner wrist, biopsy  . SCC (squamous cell carcinoma) 09/13/2013   in situ, right crown scalp, front scalp, biopsy  . Thrush     Tobacco History: Social History   Tobacco Use  Smoking Status Former Smoker  . Packs/day: 1.00  . Years: 35.00  . Pack years: 35.00  . Types: Cigarettes  . Quit date: 03/28/2003  . Years since quitting: 16.8  Smokeless Tobacco Never Used   Counseling given: Not Answered   Outpatient Medications Prior to Visit  Medication Sig Dispense Refill  . albuterol (PROVENTIL) (2.5 MG/3ML) 0.083% nebulizer solution INHALE 1 VIAL VIA NEBULIZER FOUR TIMES DAILY (Patient taking differently: Take 2.5 mg by nebulization every 6 (six) hours as needed for wheezing or shortness of breath. ) 375 mL 6  . alfuzosin (UROXATRAL) 10 MG 24 hr tablet Take 1 tablet (10 mg total) by mouth daily with breakfast. 30 tablet 11  . cetirizine (ZYRTEC) 10 MG tablet Take 10 mg by mouth daily.    . Clobetasol Prop Emollient Base (CLOBETASOL PROPIONATE E) 0.05 % emollient cream Apply 1 application topically daily. Apply to affected area after bathing x 3 weeks 60 g 1  . docusate sodium (COLACE) 100 MG capsule Take 1 capsule (100 mg total) by mouth daily as needed for mild constipation or moderate constipation. 10 capsule 0  . dorzolamide-timolol (COSOPT) 22.3-6.8 MG/ML ophthalmic solution Place 1 drop into both eyes 2 (two) times daily.     Marland Kitchen  esomeprazole (NEXIUM) 20 MG capsule Take 20 mg by mouth daily at 12 noon.    Marland Kitchen LORazepam (ATIVAN) 1 MG tablet Take 0.5 tablets (0.5 mg total) by mouth at bedtime. 15 tablet 5  . losartan (COZAAR) 25 MG tablet Take 1 tablet (25 mg total) by mouth daily. 90 tablet 1  . lovastatin (MEVACOR) 40 MG tablet Take 1 tablet (40 mg total) by mouth daily. 30 tablet 5  . meclizine (ANTIVERT) 25 MG tablet Take 1 tablet (25 mg total) by mouth 3 (three) times daily as needed for up to 15 doses for dizziness or nausea. 15 tablet 0  . Multiple Vitamin (MULTIVITAMIN) tablet Take 1 tablet by mouth daily.     . nitroGLYCERIN (NITROSTAT) 0.4 MG SL tablet Place 1 tablet (0.4 mg total) under the tongue every 5 (five) minutes as needed for chest pain. 25 tablet 5  . predniSONE (DELTASONE) 20 MG tablet 3qd  for 3d then 2qd for 3d then 1qd for 3d 18 tablet 0  . PROAIR HFA 108 (90 Base) MCG/ACT inhaler INHALE 2 PUFFS INTO THE LUNGS EVERY SIX HOURS AS NEEDED FOR WHEEZING. 8.5 g 2  . PROLENSA 0.07 % SOLN Place 1 drop into the left eye daily.    Marland Kitchen sulfamethoxazole-trimethoprim (BACTRIM DS) 800-160 MG tablet Take 1 tablet by mouth 2 (two) times daily. 14 tablet 0  . fluticasone (FLOVENT HFA) 220 MCG/ACT inhaler Inhale 2 puffs into the lungs 2 (two) times daily.     No facility-administered medications prior to visit.     Review of Systems:   Constitutional:   No  weight loss, night sweats,  Fevers, chills, + fatigue, or  lassitude.  HEENT:   No headaches,  Difficulty swallowing,  Tooth/dental problems, or  Sore throat,                No sneezing, itching, ear ache, nasal congestion, post nasal drip,   CV:  No chest pain,  Orthopnea, PND, swelling in lower extremities, anasarca, dizziness, palpitations, syncope.   GI  No heartburn, indigestion, abdominal pain, nausea, vomiting, diarrhea, change in bowel habits, loss of appetite, bloody stools.   Resp:   No chest wall deformity  Skin: no rash or lesions.  GU: no  dysuria, change in color of urine, no urgency or frequency.  No flank pain, no hematuria   MS:  No joint pain or swelling.  No decreased range of motion.  No back pain.    Physical Exam  BP (!) 98/58 (BP Location: Left Arm, Cuff Size: Normal)   Pulse 96   Temp 98 F (36.7 C) (Temporal)   Ht 6\' 1"  (1.854 m)   Wt 192 lb 9.6 oz (87.4 kg)   SpO2 94% Comment: RA  BMI 25.41 kg/m   GEN: A/Ox3; pleasant , NAD, well nourished    HEENT:  [place]/AT,    NOSE-clear, THROAT-clear, no lesions, no postnasal drip or exudate noted.   NECK:  Supple w/ fair ROM; no JVD; normal carotid impulses w/o bruits; no thyromegaly or nodules palpated; no lymphadenopathy.    RESP  Clear  P & A; w/o, wheezes/ rales/ or rhonchi. no accessory muscle use, no dullness to percussion  CARD:  RRR, no m/r/g, no peripheral edema, pulses intact, no cyanosis or clubbing.  GI:   Soft & nt; nml bowel sounds; no organomegaly or masses detected.   Musco: Warm bil, no deformities or joint swelling noted.   Neuro: alert, no focal deficits noted.    Skin: Warm, no lesions or rashes    Lab Results:    BNP    Component Value Date/Time   BNP 44.0 09/16/2016 0235    ProBNP No results found for: PROBNP  Imaging: No results found.    PFT Results Latest Ref Rng & Units 11/05/2019  FVC-Pre L 2.80  FVC-Predicted Pre % 56  FVC-Post L 2.82  FVC-Predicted Post % 56  Pre FEV1/FVC % % 56  Post FEV1/FCV % % 61  FEV1-Pre L 1.58  FEV1-Predicted Pre % 43  FEV1-Post L 1.72  DLCO uncorrected ml/min/mmHg 32.71  DLCO UNC% % 114  DLCO corrected ml/min/mmHg 32.71  DLCO COR %Predicted % 114  DLVA Predicted % 136    No results found for: NITRICOXIDE      Assessment & Plan:   COPD (chronic obstructive pulmonary disease) (HCC) Severe COPD with recent exacerbation.  Seems to be clinically improving with antibiotics and  steroids.  Would like for patient to be on dual therapy.  He is unable to tolerate  anticholinergic/LAMA due to his underlying glaucoma.  We will add in a LABA.  Change Flovent to [facility].  We will add a spacer as he gets airway irritation and throat hoarseness from ICS most likely.  Oral inhaler care discussed.  Plan  Patient Instructions  Finish steroids as directed.  Stop Flovent  Begin Dulera 2 puffs Twice daily  , brush/rinse/gargle after use. Eat yogurt daily (use with spacer)  Albuterol inhaler /neb As needed   Activity as tolerated.  Follow up with Dr. Ander Slade in 6-8 weeks and As needed   Please contact office for sooner follow up if symptoms do not improve or worsen or seek emergency care           Rexene Edison, NP 01/31/2020

## 2020-02-04 ENCOUNTER — Telehealth: Payer: Self-pay | Admitting: Adult Health

## 2020-02-04 DIAGNOSIS — R040 Epistaxis: Secondary | ICD-10-CM | POA: Diagnosis not present

## 2020-02-04 NOTE — Telephone Encounter (Signed)
Pt returning a phone call. Pt can be reached at [phone].

## 2020-02-04 NOTE — Telephone Encounter (Signed)
ATC patient left a voicemail for patient to call our office regarding prior message.

## 2020-02-04 NOTE — Telephone Encounter (Signed)
Attempted to call pt but unable to reach. Left message for him to return call. °

## 2020-02-05 ENCOUNTER — Other Ambulatory Visit: Payer: Medicare Other | Admitting: Urology

## 2020-02-05 NOTE — Telephone Encounter (Signed)
Called and spoke to pt. He states he started on Dulera on 9/16 and had a severe nosebleed on 9/19, went to his ENT and had it cauterized on 9/20. Pt states he has a history of epistaxis but is questioning if the [facility] contributed to the nosebleed. Pt denies any other issues since starting the [facility]. Pt last seen by TP on 9/16 when [facility] was started.    Tammy, please advise. Thanks.

## 2020-02-05 NOTE — Telephone Encounter (Signed)
Called and spoke with pt letting him know the info stated by TP and he verbalized understanding. Pt stated if the nosebleeds do continue, he would call us back. Nothing further needed.

## 2020-02-05 NOTE — Telephone Encounter (Signed)
I hate that he had a nosebleed those are terrible.  I do not know of anything with Dulera causing nosebleeds. It would be unusual for this to cause a nosebleed make sure when he inhales the medication on exhalation he breathes out of his mouth and not out of his nose Follow-up with ENTs recommendations for his nasal care.  If nosebleeds continue can definitely switch back from [facility] to [facility]  Please contact office for sooner follow up if symptoms do not improve or worsen or seek emergency care

## 2020-02-05 NOTE — Telephone Encounter (Signed)
Pt returning a phone call. Pt can be reached at [phone].

## 2020-02-08 ENCOUNTER — Encounter (HOSPITAL_BASED_OUTPATIENT_CLINIC_OR_DEPARTMENT_OTHER): Payer: Self-pay | Admitting: Otolaryngology

## 2020-02-08 ENCOUNTER — Other Ambulatory Visit: Payer: Self-pay

## 2020-02-08 ENCOUNTER — Other Ambulatory Visit: Payer: Self-pay | Admitting: Otolaryngology

## 2020-02-08 DIAGNOSIS — R040 Epistaxis: Secondary | ICD-10-CM | POA: Diagnosis not present

## 2020-02-09 ENCOUNTER — Other Ambulatory Visit (HOSPITAL_COMMUNITY)
Admission: RE | Admit: 2020-02-09 | Discharge: 2020-02-09 | Disposition: A | Discharge status: Home / Self Care | Payer: Medicare Other | Source: Ambulatory Visit | Attending: Otolaryngology | Admitting: Otolaryngology

## 2020-02-09 DIAGNOSIS — Z20822 Contact with and (suspected) exposure to covid-19: Secondary | ICD-10-CM | POA: Diagnosis not present

## 2020-02-09 DIAGNOSIS — Z01812 Encounter for preprocedural laboratory examination: Secondary | ICD-10-CM | POA: Diagnosis not present

## 2020-02-09 LAB — SARS CORONAVIRUS 2 (TAT 6-24 HRS): SARS Coronavirus 2: NEGATIVE

## 2020-02-10 NOTE — Anesthesia Preprocedure Evaluation (Addendum)
Anesthesia Evaluation  Patient identified by MRN, date of birth, ID band Patient awake    Reviewed: Allergy & Precautions, NPO status , Patient's Chart, lab work & pertinent test results  History of Anesthesia Complications Negative for: history of anesthetic complications  Airway Mallampati: III  TM Distance: >3 FB Neck ROM: Full    Dental  (+) Dental Advisory Given, Teeth Intact   Pulmonary asthma , COPD,  COPD inhaler, former smoker,    Pulmonary exam normal        Cardiovascular hypertension, Pt. on medications + CAD and + Past MI  Normal cardiovascular exam+ dysrhythmias    Hx AAA - '20 [place] - There is evidence of abnormal dilatation of the mid and distal Abdominal aorta. The largest aortic measurement is 4.1 cm. The largest aortic diameter remains essentially unchanged compared to prior exam.  '16 Myoperfusion - Normal Lexiscan myoview No evidence of ischemia Normal LV function with EV of 56%    Neuro/Psych PSYCHIATRIC DISORDERS Depression negative neurological ROS     GI/Hepatic Neg liver ROS, GERD  Medicated and Controlled,  Endo/Other  negative endocrine ROS  Renal/GU negative Renal ROS     Musculoskeletal  (+) Arthritis ,   Abdominal   Peds  Hematology negative hematology ROS (+)   Anesthesia Other Findings Covid test negative   Reproductive/Obstetrics                            Anesthesia Physical Anesthesia Plan  ASA: III  Anesthesia Plan: General   Post-op Pain Management:    Induction: Intravenous  PONV Risk Score and Plan: 2 and Treatment may vary due to age or medical condition and Ondansetron  Airway Management Planned: Oral ETT  Additional Equipment: None  Intra-op Plan:   Post-operative Plan: Extubation in OR  Informed Consent: I have reviewed the patients History and Physical, chart, labs and discussed the procedure including the risks, benefits and  alternatives for the proposed anesthesia with the patient or authorized representative who has indicated his/her understanding and acceptance.     Dental advisory given  Plan Discussed with: CRNA and Anesthesiologist  Anesthesia Plan Comments:        Anesthesia Quick Evaluation

## 2020-02-11 ENCOUNTER — Encounter (HOSPITAL_BASED_OUTPATIENT_CLINIC_OR_DEPARTMENT_OTHER)
Admission: RE | Disposition: A | Discharge status: Home / Self Care | Payer: Self-pay | Source: Home / Self Care | Attending: Otolaryngology

## 2020-02-11 ENCOUNTER — Ambulatory Visit (HOSPITAL_BASED_OUTPATIENT_CLINIC_OR_DEPARTMENT_OTHER): Payer: Medicare Other | Admitting: Anesthesiology

## 2020-02-11 ENCOUNTER — Encounter (HOSPITAL_BASED_OUTPATIENT_CLINIC_OR_DEPARTMENT_OTHER): Payer: Self-pay | Admitting: Otolaryngology

## 2020-02-11 ENCOUNTER — Ambulatory Visit (HOSPITAL_BASED_OUTPATIENT_CLINIC_OR_DEPARTMENT_OTHER)
Admission: RE | Admit: 2020-02-11 | Discharge: 2020-02-11 | Disposition: A | Discharge status: Home / Self Care | Payer: Medicare Other | Attending: Otolaryngology | Admitting: Otolaryngology

## 2020-02-11 ENCOUNTER — Other Ambulatory Visit: Payer: Self-pay

## 2020-02-11 DIAGNOSIS — J449 Chronic obstructive pulmonary disease, unspecified: Secondary | ICD-10-CM | POA: Diagnosis not present

## 2020-02-11 DIAGNOSIS — I8290 Acute embolism and thrombosis of unspecified vein: Secondary | ICD-10-CM | POA: Diagnosis not present

## 2020-02-11 DIAGNOSIS — I10 Essential (primary) hypertension: Secondary | ICD-10-CM | POA: Diagnosis not present

## 2020-02-11 DIAGNOSIS — R040 Epistaxis: Secondary | ICD-10-CM | POA: Diagnosis not present

## 2020-02-11 DIAGNOSIS — E782 Mixed hyperlipidemia: Secondary | ICD-10-CM | POA: Diagnosis not present

## 2020-02-11 HISTORY — PX: NASAL ENDOSCOPY WITH EPISTAXIS CONTROL: SHX5664

## 2020-02-11 SURGERY — CONTROL OF EPISTAXIS, ENDOSCOPIC
Anesthesia: General | Site: Nose | Laterality: Bilateral

## 2020-02-11 MED ORDER — FENTANYL CITRATE (PF) 100 MCG/2ML IJ SOLN: 25.0000 ug | INTRAMUSCULAR | Status: DC | PRN

## 2020-02-11 MED ORDER — PROPOFOL 10 MG/ML IV BOLUS
INTRAVENOUS | Status: DC | PRN
  Administered 2020-02-11: 110 mg via INTRAVENOUS
  Administered 2020-02-11: 40 mg via INTRAVENOUS

## 2020-02-11 MED ORDER — LACTATED RINGERS IV SOLN: INTRAVENOUS | Status: DC

## 2020-02-11 MED ORDER — ONDANSETRON HCL 4 MG/2ML IJ SOLN
INTRAMUSCULAR | Status: AC
  Filled 2020-02-11: qty 2

## 2020-02-11 MED ORDER — OXYCODONE HCL 5 MG PO TABS: 5.0000 mg | ORAL_TABLET | Freq: Once | ORAL | Status: DC | PRN

## 2020-02-11 MED ORDER — FENTANYL CITRATE (PF) 100 MCG/2ML IJ SOLN
INTRAMUSCULAR | Status: AC
  Filled 2020-02-11: qty 2

## 2020-02-11 MED ORDER — OXYMETAZOLINE HCL 0.05 % NA SOLN
NASAL | Status: DC | PRN
  Administered 2020-02-11: 1 via TOPICAL

## 2020-02-11 MED ORDER — ONDANSETRON HCL 4 MG/2ML IJ SOLN: 4.0000 mg | Freq: Once | INTRAMUSCULAR | Status: DC | PRN

## 2020-02-11 MED ORDER — FENTANYL CITRATE (PF) 100 MCG/2ML IJ SOLN
INTRAMUSCULAR | Status: DC | PRN
Start: 2020-02-11 — End: 2020-02-11
  Administered 2020-02-11: 50 ug via INTRAVENOUS

## 2020-02-11 MED ORDER — LIDOCAINE 2% (20 MG/ML) 5 ML SYRINGE
INTRAMUSCULAR | Status: AC
  Filled 2020-02-11: qty 5

## 2020-02-11 MED ORDER — LIDOCAINE 2% (20 MG/ML) 5 ML SYRINGE
INTRAMUSCULAR | Status: DC | PRN
  Administered 2020-02-11: 40 mg via INTRAVENOUS

## 2020-02-11 MED ORDER — PHENYLEPHRINE 40 MCG/ML (10ML) SYRINGE FOR IV PUSH (FOR BLOOD PRESSURE SUPPORT)
PREFILLED_SYRINGE | INTRAVENOUS | Status: DC | PRN
  Administered 2020-02-11: 80 ug via INTRAVENOUS
  Administered 2020-02-11: 40 ug via INTRAVENOUS
  Administered 2020-02-11: 80 ug via INTRAVENOUS

## 2020-02-11 MED ORDER — OXYCODONE HCL 5 MG/5ML PO SOLN: 5.0000 mg | Freq: Once | ORAL | Status: DC | PRN

## 2020-02-11 MED ORDER — ONDANSETRON HCL 4 MG/2ML IJ SOLN
INTRAMUSCULAR | Status: DC | PRN
  Administered 2020-02-11: 4 mg via INTRAVENOUS

## 2020-02-11 MED ORDER — EPHEDRINE SULFATE-NACL 50-0.9 MG/10ML-% IV SOSY
PREFILLED_SYRINGE | INTRAVENOUS | Status: DC | PRN
  Administered 2020-02-11 (×2): 10 mg via INTRAVENOUS
  Administered 2020-02-11: 5 mg via INTRAVENOUS

## 2020-02-11 SURGICAL SUPPLY — 27 items
APL SWBSTK 6 STRL LF DISP (MISCELLANEOUS) ×2
APPLICATOR COTTON TIP 6 STRL (MISCELLANEOUS) ×2 IMPLANT
APPLICATOR COTTON TIP 6IN STRL (MISCELLANEOUS) ×4
CANISTER SUCT 1200ML W/VALVE (MISCELLANEOUS) ×2 IMPLANT
COAGULATOR SUCT 8FR VV (MISCELLANEOUS) ×2 IMPLANT
COVER WAND RF STERILE (DRAPES) IMPLANT
DECANTER SPIKE VIAL GLASS SM (MISCELLANEOUS) IMPLANT
DEPRESSOR TONGUE BLADE STERILE (MISCELLANEOUS) IMPLANT
DRSG TELFA 3X8 NADH (GAUZE/BANDAGES/DRESSINGS) IMPLANT
ELECT REM PT RETURN 9FT ADLT (ELECTROSURGICAL) ×2
ELECT REM PT RETURN 9FT PED (ELECTROSURGICAL)
ELECTRODE REM PT RETRN 9FT PED (ELECTROSURGICAL) IMPLANT
ELECTRODE REM PT RTRN 9FT ADLT (ELECTROSURGICAL) ×1 IMPLANT
GAUZE SPONGE 4X4 12PLY STRL (GAUZE/BANDAGES/DRESSINGS) IMPLANT
GLOVE BIO SURGEON STRL SZ 6.5 (GLOVE) ×2 IMPLANT
GLOVE BIO SURGEON STRL SZ7.5 (GLOVE) ×2 IMPLANT
GLOVE BIOGEL PI IND STRL 7.0 (GLOVE) ×1 IMPLANT
GLOVE BIOGEL PI INDICATOR 7.0 (GLOVE) ×1
MARKER SKIN DUAL TIP RULER LAB (MISCELLANEOUS) IMPLANT
PACK BASIN DAY SURGERY FS (CUSTOM PROCEDURE TRAY) ×2 IMPLANT
PATTIES SURGICAL .5 X3 (DISPOSABLE) ×2 IMPLANT
SHEET MEDIUM DRAPE 40X70 STRL (DRAPES) ×2 IMPLANT
SOLUTION BUTLER CLEAR DIP (MISCELLANEOUS) ×2 IMPLANT
SPONGE GAUZE 2X2 8PLY STRL LF (GAUZE/BANDAGES/DRESSINGS) IMPLANT
SPONGE NEURO XRAY DETECT 1X3 (DISPOSABLE) IMPLANT
TOWEL GREEN STERILE FF (TOWEL DISPOSABLE) ×2 IMPLANT
TUBE CONNECTING 20X1/4 (TUBING) ×2 IMPLANT

## 2020-02-11 NOTE — Discharge Instructions (Signed)
  Post Anesthesia Home Care Instructions  Activity: Get plenty of rest for the remainder of the day. A responsible individual must stay with you for 24 hours following the procedure.  For the next 24 hours, DO NOT: -Drive a car -Paediatric nurse -Drink alcoholic beverages -Take any medication unless instructed by your physician -Make any legal decisions or sign important papers.  Meals: Start with liquid foods such as gelatin or soup. Progress to regular foods as tolerated. Avoid greasy, spicy, heavy foods. If nausea and/or vomiting occur, drink only clear liquids until the nausea and/or vomiting subsides. Call your physician if vomiting continues.  Special Instructions/Symptoms: Your throat may feel dry or sore from the anesthesia or the breathing tube placed in your throat during surgery. If this causes discomfort, gargle with warm salt water. The discomfort should disappear within 24 hours.  If you had a scopolamine patch placed behind your ear for the management of post- operative nausea and/or vomiting:  1. The medication in the patch is effective for 72 hours, after which it should be removed.  Wrap patch in a tissue and discard in the trash. Wash hands thoroughly with soap and water. 2. You may remove the patch earlier than 72 hours if you experience unpleasant side effects which may include dry mouth, dizziness or visual disturbances. 3. Avoid touching the patch. Wash your hands with soap and water after contact with the patch.    ------------  The patient may resume all his previous activities and diet.  He has no postop restriction.

## 2020-02-11 NOTE — Anesthesia Postprocedure Evaluation (Signed)
Anesthesia Post Note  Patient: [name] [name]  Procedure(s) Performed: NASAL ENDOSCOPY WITH EPISTAXIS CONTROL (Bilateral Nose)     Patient location during evaluation: PACU Anesthesia Type: General Level of consciousness: awake and alert Pain management: pain level controlled Vital Signs Assessment: post-procedure vital signs reviewed and stable Respiratory status: spontaneous breathing, nonlabored ventilation and respiratory function stable Cardiovascular status: blood pressure returned to baseline and stable Postop Assessment: no apparent nausea or vomiting Anesthetic complications: no   No complications documented.  Last Vitals:  Vitals:   02/11/20 1015 02/11/20 1021  BP: 124/83 123/86  Pulse: 72 64  Resp: 16 (!) 21  Temp:    SpO2:  96%    Last Pain:  Vitals:   02/11/20 1021  TempSrc:   PainSc: 0-No pain                 Audry Pili

## 2020-02-11 NOTE — Transfer of Care (Signed)
Immediate Anesthesia Transfer of Care Note  Patient: [name] [name]  Procedure(s) Performed: NASAL ENDOSCOPY WITH EPISTAXIS CONTROL (Bilateral Nose)  Patient Location: PACU  Anesthesia Type:General  Level of Consciousness: drowsy and patient cooperative  Airway & Oxygen Therapy: Patient Spontanous Breathing and Patient connected to face mask oxygen  Post-op Assessment: Report given to RN and Post -op Vital signs reviewed and stable  Post vital signs: Reviewed and stable  Last Vitals:  Vitals Value Taken Time  BP 129/75 02/11/20 1000  Temp 36.4 C 02/11/20 1000  Pulse 81 02/11/20 1002  Resp 18 02/11/20 1002  SpO2 100 % 02/11/20 1002  Vitals shown include unvalidated device data.  Last Pain:  Vitals:   02/11/20 0831  TempSrc: Oral  PainSc: 0-No pain         Complications: No complications documented.

## 2020-02-11 NOTE — H&P (Signed)
Cc: Recurrent right epistaxis  HPI:  The patient is a 79 year old male who presents today complaining of more recurrent right epistaxis.  The patient was last seen 1 month ago.  At that time, he was noted to have multiple hypervascular areas on his right anterior, superior and posterior nasal septum.  He underwent endoscopic cauterization of his nasal septum.  The patient was also encouraged to use nasal saline gel daily.  According to the patient, he was doing well for 2 to 3 weeks.  However, over the past week, he has noted more recurrent right nasal bleeding. He had a severe episode last night, lasting almost 3 hours.  The bleeding is both anterior and posterior.   Exam: The right nasal cavity is sprayed with topical xylocaine and neo-synephrine.  After adequate anesthesia is achieved, the nasal cavity is examined with a 0 rigid endoscope.  A suction catheter is inserted in parallel with the 0 endoscope, and it is used to suction blood clots from the right nasal cavity. Profuse bleeding is noted from a hypervascular area on the right posterior nasal septum.   A silver nitrate stick is inserted in parallel with the 0 endoscope.  It is used to repeatedly cauterized the hypervascular areas.  A posterior nasal packing is placed with good hemostasis. The patient tolerated the procedure well.  Assessment:  1.  Profuse bleeding is noted from a hypervascular area on the right posterior nasal septum.  2.  The rest of his nasal septum is healing well.   Plan: 1.  Endoscopic cauterization of the right posterior nasal septum.  2. In light of frequent recurrent bleeding, the decision is made to proceed with control of the bleeding with electrocautery in the OR. The R/B/A of the procedure are discussed.

## 2020-02-11 NOTE — Op Note (Signed)
DATE OF PROCEDURE: 02/11/2020  OPERATIVE REPORT   SURGEON: Leta Baptist, MD  PREOPERATIVE DIAGNOSIS:Recurrent right posterior epistaxis  POSTOPERATIVE DIAGNOSIS: Recurrent right posterior epistaxis  PROCEDURES PERFORMED: 1.  Endoscopic control of of recurrent right epistaxis with cauterization of the right posterior nasal septum.  ANESTHESIA: General laryngeal mask anesthesia.  COMPLICATIONS: None.  ESTIMATED BLOOD LOSS: Minimal.  INDICATION FOR PROCEDURE:  [name] [name] is a 79 y.o. male with a history of a recurrent right epistaxis.  He previously underwent multiple cauterization procedure of his right nasal septum.  Despite the treatment, he continued to have frequent recurrent severe epistaxis.  At his last appointment, he was noted to have large hypervascular area on his a right posterior nasal septum.  Based on the above findings, the decision was made for the patient to undergo the above-stated procedure. The risks, benefits, alternatives, and details of the procedures were discussed with the patient. Questions were invited and answered. Informed consent was obtained.  DESCRIPTION OF PROCEDURE: The patient was taken to the operating room and placed supine on the operating table. General laryngeal mask anesthesia was induced by the anesthesiologist. Pledgets soaked with epinephrine were placed in the right nasal cavity.  The pledgets were subsequently removed.  Using a 0 degree endoscope, the right nasal cavity was examined.  A large hypervascular area was noted on the right posterior nasal septum.  Granulation tissue was noted around the hypervascular area.  The granulation tissue was biopsied and sent to the pathology department for permanent histologic identification.  The hypervascular area was extensively cauterized with a suction electrocautery device.  Good hemostasis was achieved.  The care of the patient was turned over to the anesthesiologist. The patient was  awakened from anesthesia without difficulty. He was extubated and transferred to the recovery room in good condition.  OPERATIVE FINDINGS:A large hypervascular area was noted on the right nasal septum.  A small amount of granulation tissue was noted around the hypervascular area.  SPECIMEN:Biopsy of the right septal granulation tissue.  FOLLOWUP CARE: The patient will be discharged home once he is awake and alert. He will follow up in my office in approximately 4 weeks.

## 2020-02-11 NOTE — Anesthesia Procedure Notes (Signed)
Procedure Name: LMA Insertion Date/Time: 02/11/2020 9:30 AM Performed by: Gwyndolyn Saxon, CRNA Pre-anesthesia Checklist: Patient identified, Emergency Drugs available, Suction available and Patient being monitored Patient Re-evaluated:Patient Re-evaluated prior to induction Oxygen Delivery Method: Circle system utilized Preoxygenation: Pre-oxygenation with 100% oxygen Induction Type: IV induction Ventilation: Mask ventilation without difficulty LMA: LMA inserted LMA Size: 5.0 Number of attempts: 1 Placement Confirmation: positive ETCO2 and breath sounds checked- equal and bilateral Tube secured with: Tape Dental Injury: Teeth and Oropharynx as per pre-operative assessment

## 2020-02-12 ENCOUNTER — Telehealth: Payer: Self-pay

## 2020-02-12 ENCOUNTER — Encounter (HOSPITAL_BASED_OUTPATIENT_CLINIC_OR_DEPARTMENT_OTHER): Payer: Self-pay | Admitting: Otolaryngology

## 2020-02-12 LAB — SURGICAL PATHOLOGY

## 2020-02-12 MED ORDER — LORAZEPAM 0.5 MG PO TABS
0.5000 mg | ORAL_TABLET | Freq: Every day | ORAL | 1 refills | Status: DC
Start: 2020-02-12 — End: 2020-04-09

## 2020-02-12 NOTE — Telephone Encounter (Signed)
Please advise. Thank you

## 2020-02-12 NOTE — Telephone Encounter (Signed)
Pt contacted and verbalized understanding.  

## 2020-02-12 NOTE — Telephone Encounter (Signed)
Pt is requesting refill on LORazepam (ATIVAN) 1 MG tablet  [facility] [place]

## 2020-02-13 ENCOUNTER — Telehealth: Payer: Self-pay | Admitting: Dermatology

## 2020-02-13 NOTE — Telephone Encounter (Signed)
ST Rx'd clobetasol for neck for psoriasis, wanted to know if ok for forehead; per SS told him no. Has rosacea(?), possibly psoriasis on forehead: dry, red, flaky, which is what he wants something for. Pharmacy: [facility].

## 2020-02-13 NOTE — Telephone Encounter (Signed)
See patients note wants something for is face that is safe to use.

## 2020-02-14 MED ORDER — HYDROCORTISONE 2.5 % EX OINT: TOPICAL_OINTMENT | Freq: Two times a day (BID) | CUTANEOUS | 3 refills | Status: DC

## 2020-02-14 NOTE — Telephone Encounter (Signed)
Left message for patient to call back to let him know we sent in prescription that is safe for the face.

## 2020-02-14 NOTE — Telephone Encounter (Signed)
May use generic 2.5% hytone ointment nightly for one month then contact us.

## 2020-02-19 DIAGNOSIS — N182 Chronic kidney disease, stage 2 (mild): Secondary | ICD-10-CM | POA: Diagnosis not present

## 2020-02-27 ENCOUNTER — Ambulatory Visit (INDEPENDENT_AMBULATORY_CARE_PROVIDER_SITE_OTHER): Payer: Medicare Other

## 2020-02-27 ENCOUNTER — Other Ambulatory Visit: Payer: Self-pay | Admitting: Cardiovascular Disease

## 2020-02-27 DIAGNOSIS — I714 Abdominal aortic aneurysm, without rupture, unspecified: Secondary | ICD-10-CM

## 2020-03-06 ENCOUNTER — Other Ambulatory Visit: Payer: Self-pay

## 2020-03-06 ENCOUNTER — Ambulatory Visit (INDEPENDENT_AMBULATORY_CARE_PROVIDER_SITE_OTHER): Payer: Medicare Other | Admitting: Family Medicine

## 2020-03-06 VITALS — HR 96 | Temp 99.3°F | Resp 18

## 2020-03-06 DIAGNOSIS — J441 Chronic obstructive pulmonary disease with (acute) exacerbation: Secondary | ICD-10-CM | POA: Diagnosis not present

## 2020-03-06 MED ORDER — SULFAMETHOXAZOLE-TRIMETHOPRIM 800-160 MG PO TABS: 1.0000 | ORAL_TABLET | Freq: Two times a day (BID) | ORAL | 0 refills | Status: DC

## 2020-03-06 NOTE — Progress Notes (Signed)
Patient ID: [name] [name], male    DOB: 05/04/1941, 79 y.o.   MRN: [id number]   Chief Complaint  Patient presents with  . Nasal Congestion    Patient reports congestion and and some difficulty breathing. Has been going on awhile but getting worse over the last 3 days. Patient using mucinex D and inhalers.    Subjective:    [name] [name] presents today with a complaint of cough.  He does have a history of COPD.  On September 9, he saw Dr. Sallee Lange for the same cough with COPD exacerbation, on September 16 he was seen by pulmonology specialist in which acute bronchitis was diagnosed, and he was prescribed steroids.  Sometime in the last 4 weeks he has had surgery on his right nare due to a nosebleed problem that he has had for a while.  That side of the nose is continued to be stopped up, which can be expected after nasal surgery.  He says he was using Afrin spray at night for that for a few days at a time.  Pertinent negatives include no fever, no severe shortness of breath (his baseline is slight shortness of breath), "my breathing is not where it should be "he does report that he felt chills yesterday afternoon.  He is compliant with his COPD medications and inhalers, and is currently on a prednisone taper given to him by the surgeon.  To note he has several allergies to antibiotics.    Medical History [name] [name] has a past medical history of AAA (abdominal aortic aneurysm) ([place]), Allergy, Anemia, Arthritis, Asthma, BCC (basal cell carcinoma) (08/18/1989), BCC (basal cell carcinoma) (01/31/1992), BCC (basal cell carcinoma) (11/22/2001), BCC (basal cell carcinoma) (10/09/2003), BCC (basal cell carcinoma) (08/15/2008), BPH (benign prostatic hyperplasia), CAD (coronary artery disease), Cancer ([place]), COPD (chronic obstructive pulmonary disease) ([place]), Dysrhythmia, GERD (gastroesophageal reflux disease), Glaucoma, HOH (hard of hearing), Hypercholesterolemia, Hypertension, Impaired fasting glucose, Low back  pain, Melanoma in situ ([place]) (10/09/2003), MI (myocardial infarction) ([place]) (1999), SCC (squamous cell carcinoma) (07/03/2014), SCC (squamous cell carcinoma) (07/03/2014), SCC (squamous cell carcinoma) (07/20/2017), SCC (squamous cell carcinoma) (01/10/2019), SCC (squamous cell carcinoma) (11/22/2001), SCC (squamous cell carcinoma) (10/09/2003), SCC (squamous cell carcinoma) (03/30/2004), SCC (squamous cell carcinoma) (03/08/2005), SCC (squamous cell carcinoma) (06/08/2006), SCC (squamous cell carcinoma) (05/05/2010), SCC (squamous cell carcinoma) (09/13/2013), and Thrush.   Outpatient Encounter Medications as of 03/06/2020  Medication Sig  . albuterol (PROVENTIL) (2.5 MG/3ML) 0.083% nebulizer solution INHALE 1 VIAL VIA NEBULIZER FOUR TIMES DAILY (Patient taking differently: Take 2.5 mg by nebulization every 6 (six) hours as needed for wheezing or shortness of breath. )  . alfuzosin (UROXATRAL) 10 MG 24 hr tablet Take 1 tablet (10 mg total) by mouth daily with breakfast.  . cetirizine (ZYRTEC) 10 MG tablet Take 10 mg by mouth daily.  . Clobetasol Prop Emollient Base (CLOBETASOL PROPIONATE E) 0.05 % emollient cream Apply 1 application topically daily. Apply to affected area after bathing x 3 weeks  . dorzolamide-timolol (COSOPT) 22.3-6.8 MG/ML ophthalmic solution Place 1 drop into both eyes 2 (two) times daily.   Marland Kitchen esomeprazole (NEXIUM) 20 MG capsule Take 20 mg by mouth daily at 12 noon.  . fluticasone (FLOVENT HFA) 220 MCG/ACT inhaler Inhale 2 puffs into the lungs 2 (two) times daily.  . hydrocortisone 2.5 % ointment Apply topically 2 (two) times daily. [place] for face  . LORazepam (ATIVAN) 0.5 MG tablet Take 1 tablet (0.5 mg total) by mouth at bedtime.  Marland Kitchen losartan (COZAAR) 25 MG  tablet Take 1 tablet (25 mg total) by mouth daily.  Marland Kitchen lovastatin (MEVACOR) 40 MG tablet Take 1 tablet (40 mg total) by mouth daily.  . nitroGLYCERIN (NITROSTAT) 0.4 MG SL tablet Place 1 tablet (0.4 mg total) under the tongue  every 5 (five) minutes as needed for chest pain.  . predniSONE (STERAPRED UNI-PAK 21 TAB) 10 MG (21) TBPK tablet Take by mouth.  Marland Kitchen PROAIR HFA 108 (90 Base) MCG/ACT inhaler INHALE 2 PUFFS INTO THE LUNGS EVERY SIX HOURS AS NEEDED FOR WHEEZING.  Marland Kitchen sulfamethoxazole-trimethoprim (BACTRIM DS) 800-160 MG tablet Take 1 tablet by mouth 2 (two) times daily.   No facility-administered encounter medications on file as of 03/06/2020.     Review of Systems  Constitutional: Positive for chills. Negative for fever.  Respiratory: Positive for cough, shortness of breath and wheezing.   Cardiovascular: Negative for chest pain.  Gastrointestinal: Negative.   Neurological: Negative.      Vitals Pulse 96   Temp 99.3 F (37.4 C)   Resp 18   SpO2 95%   Objective:   Physical Exam Constitutional:      General: He is not in acute distress.    Appearance: Normal appearance. He is ill-appearing. He is not toxic-appearing or diaphoretic.  HENT:     Right Ear: Tympanic membrane normal.     Left Ear: Tympanic membrane normal.     Nose: Nose normal.     Mouth/Throat:     Mouth: Mucous membranes are moist.     Pharynx: Oropharynx is clear. No oropharyngeal exudate.  Cardiovascular:     Rate and Rhythm: Normal rate and regular rhythm.     Heart sounds: Normal heart sounds.  Pulmonary:     Breath sounds: Wheezing and rhonchi present.     Comments: Scattered. No accessory muscle use, he is able to converse with me throughout the visit without SOB.  Skin:    General: Skin is warm and dry.  Neurological:     Mental Status: He is alert and oriented to person, place, and time.  Psychiatric:        Mood and Affect: Mood normal.        Behavior: Behavior normal.        Thought Content: Thought content normal.        Judgment: Judgment normal.      Assessment and Plan   1. COPD exacerbation (HCC) - predniSONE (STERAPRED UNI-PAK 21 TAB) 10 MG (21) TBPK tablet; Take by mouth. -  sulfamethoxazole-trimethoprim (BACTRIM DS) 800-160 MG tablet; Take 1 tablet by mouth 2 (two) times daily.  Dispense: 20 tablet; Refill: 0   ENT ordered prednisone late yesterday, started today. On 9/9, exacerbaton of COPD, Dr. Wolfgang Phoenix started on Abx. Consider treatment failure, will reorder Bactrim (considered changing, unable due to multiple allergies).   Due to his history, there is a low threshold for initiation of antibiotic therapy.  He will take another course of Bactrim, and let us know if he is not feeling better.  He will continue the prednisone taper given to him by ENT surgeon.  If he is not improved with the second course of Bactrim, will consider pulmonology versus hospitalization for IV therapy.  Today he appears stable and not in distress.  Agrees with plan of care discussed today. Understands warning signs to seek further care: Shortness of breath, chest pain, fever, chills, rigors, and any significant change in health status. Understands to follow-up in

## 2020-03-06 NOTE — Progress Notes (Signed)
Follow-Up Visit   Subjective  [name] [name] is a 79 y.o. male who presents for the following: Skin Problem (rash/ breakout- right post neck- it comes and goes tx-PCP gave rx bactrim).  Rash breakout Location: post neck Duration:  Quality:  Associated Signs/Symptoms: Modifying Factors:  Severity:  Timing: Context:   Objective  Well appearing patient in no apparent distress; mood and affect are within normal limits.  A full examination was performed including scalp, head, eyes, ears, nose, lips, neck, chest, axillae, abdomen, back, buttocks, bilateral upper extremities, bilateral lower extremities, hands, feet, fingers, toes, fingernails, and toenails. All findings within normal limits unless otherwise noted below.   Assessment & Plan     Subtle papular urticaria historically it has come and gone with some improvement on a recent antibiotic for respiratory infection.  We will try clobetasol solution every day for the next 3 weeks and then decide on any evaluation.  General skin examination from the waist up showed no sign of melanoma.  A dark spot on the left side of the neck is a 3 mm tan macule which represents a solar lentigo and is safe to leave if stable.  Moderately thick solar keratoses on the left hand on the forehead were treated with liquid nitrogen freeze.  Follow-up for the rash will be a phone call in 3 weeks.   Dermatitis (2) Neck - Posterior; Scalp  Subtle papular urticaria posterior neck,historically it has come and gone with some improvement on a recent antibiotic for respiratory infection.  We will try clobetasol solution every day for the next 3 weeks and then decide on any evaluation.  General skin examination from the waist up showed no sign of melanoma.  A dark spot on the left side of the neck is a 3 mm tan macule which represents a solar lentigo and is safe to leave if stable.  Moderately thick solar keratoses on the left hand on the forehead were treated with  liquid nitrogen freeze.  Follow-up for the rash will be a phone call in 3 weeks.  Clobetasol Prop Emollient Base (CLOBETASOL PROPIONATE E) 0.05 % emollient cream - Neck - Posterior, Scalp  Screening for malignant neoplasm of skin Mid Back  Yearly skin exam.   Solar lentigo Left Anterior Neck  Lesion represents solar lentigo. No treatment needed unless it changes.   Solar keratosis (2) Head - Anterior (Face); Left Hand - Posterior  Frozen with Liquid nitrogen. Follow up as needed.   Destruction of lesion - Head - Anterior (Face), Left Hand - Posterior Complexity: simple   Destruction method: cryotherapy   Informed consent: discussed and consent obtained   Timeout:  patient name, date of birth, surgical site, and procedure verified Lesion destroyed using liquid nitrogen: Yes   Cryotherapy cycles:  1 Outcome: patient tolerated procedure well with no complications   Post-procedure details: wound care instructions given   Subtle papular urticaria posterior neck, historically it has come and gone with some improvement on a recent antibiotic for respiratory infection.  We will try clobetasol solution every day for the next 3 weeks and then decide on any evaluation.  General skin examination from the waist up showed no sign of melanoma.  A dark spot on the left side of the neck is a 3 mm tan macule which represents a solar lentigo and is safe to leave if stable.  Moderately thick solar keratoses on the left hand and on the forehead were treated with liquid nitrogen freeze.  Follow-up for the rash will be a phone call in 3 weeks.    I, Lavonna Monarch, MD, have reviewed all documentation for this visit.  The documentation on 03/08/20 for the exam, diagnosis, procedures, and orders are all accurate and complete.

## 2020-03-07 ENCOUNTER — Encounter: Payer: Self-pay | Admitting: Family Medicine

## 2020-03-07 ENCOUNTER — Encounter: Payer: Self-pay | Admitting: Dermatology

## 2020-03-10 DIAGNOSIS — Z1159 Encounter for screening for other viral diseases: Secondary | ICD-10-CM | POA: Diagnosis not present

## 2020-03-13 DIAGNOSIS — K859 Acute pancreatitis without necrosis or infection, unspecified: Secondary | ICD-10-CM | POA: Diagnosis not present

## 2020-03-13 DIAGNOSIS — Z9049 Acquired absence of other specified parts of digestive tract: Secondary | ICD-10-CM | POA: Diagnosis not present

## 2020-03-13 DIAGNOSIS — R1084 Generalized abdominal pain: Secondary | ICD-10-CM | POA: Diagnosis not present

## 2020-03-13 DIAGNOSIS — R932 Abnormal findings on diagnostic imaging of liver and biliary tract: Secondary | ICD-10-CM | POA: Diagnosis not present

## 2020-03-13 DIAGNOSIS — K869 Disease of pancreas, unspecified: Secondary | ICD-10-CM | POA: Diagnosis not present

## 2020-03-17 DIAGNOSIS — R040 Epistaxis: Secondary | ICD-10-CM | POA: Diagnosis not present

## 2020-03-18 ENCOUNTER — Emergency Department (HOSPITAL_COMMUNITY): Payer: Medicare Other

## 2020-03-18 ENCOUNTER — Emergency Department (HOSPITAL_COMMUNITY)
Admission: EM | Admit: 2020-03-18 | Discharge: 2020-03-18 | Disposition: A | Discharge status: Home / Self Care | Payer: Medicare Other | Attending: Emergency Medicine | Admitting: Emergency Medicine

## 2020-03-18 ENCOUNTER — Other Ambulatory Visit: Payer: Self-pay

## 2020-03-18 ENCOUNTER — Encounter (HOSPITAL_COMMUNITY): Payer: Self-pay | Admitting: Emergency Medicine

## 2020-03-18 ENCOUNTER — Other Ambulatory Visit: Payer: Medicare Other | Admitting: Urology

## 2020-03-18 DIAGNOSIS — I2510 Atherosclerotic heart disease of native coronary artery without angina pectoris: Secondary | ICD-10-CM | POA: Insufficient documentation

## 2020-03-18 DIAGNOSIS — K219 Gastro-esophageal reflux disease without esophagitis: Secondary | ICD-10-CM | POA: Insufficient documentation

## 2020-03-18 DIAGNOSIS — Z85038 Personal history of other malignant neoplasm of large intestine: Secondary | ICD-10-CM | POA: Diagnosis not present

## 2020-03-18 DIAGNOSIS — Z87891 Personal history of nicotine dependence: Secondary | ICD-10-CM | POA: Insufficient documentation

## 2020-03-18 DIAGNOSIS — R1031 Right lower quadrant pain: Secondary | ICD-10-CM | POA: Diagnosis present

## 2020-03-18 DIAGNOSIS — J449 Chronic obstructive pulmonary disease, unspecified: Secondary | ICD-10-CM | POA: Diagnosis not present

## 2020-03-18 DIAGNOSIS — K4090 Unilateral inguinal hernia, without obstruction or gangrene, not specified as recurrent: Secondary | ICD-10-CM

## 2020-03-18 DIAGNOSIS — Z85828 Personal history of other malignant neoplasm of skin: Secondary | ICD-10-CM | POA: Insufficient documentation

## 2020-03-18 DIAGNOSIS — I10 Essential (primary) hypertension: Secondary | ICD-10-CM | POA: Diagnosis not present

## 2020-03-18 DIAGNOSIS — K5730 Diverticulosis of large intestine without perforation or abscess without bleeding: Secondary | ICD-10-CM | POA: Diagnosis not present

## 2020-03-18 DIAGNOSIS — N200 Calculus of kidney: Secondary | ICD-10-CM | POA: Diagnosis not present

## 2020-03-18 DIAGNOSIS — I714 Abdominal aortic aneurysm, without rupture: Secondary | ICD-10-CM | POA: Diagnosis not present

## 2020-03-18 DIAGNOSIS — Z7951 Long term (current) use of inhaled steroids: Secondary | ICD-10-CM | POA: Insufficient documentation

## 2020-03-18 LAB — COMPREHENSIVE METABOLIC PANEL
ALT: 22 U/L (ref 0–44)
AST: 20 U/L (ref 15–41)
Albumin: 3.5 g/dL (ref 3.5–5.0)
Alkaline Phosphatase: 61 U/L (ref 38–126)
Anion gap: 8 (ref 5–15)
BUN: 11 mg/dL (ref 8–23)
CO2: 26 mmol/L (ref 22–32)
Calcium: 8.7 mg/dL — ABNORMAL LOW (ref 8.9–10.3)
Chloride: 101 mmol/L (ref 98–111)
Creatinine, Ser: 0.88 mg/dL (ref 0.61–1.24)
GFR, Estimated: >60 mL/min (ref >60)
Glucose, Bld: 110 mg/dL — ABNORMAL HIGH (ref 70–99)
Potassium: 4 mmol/L (ref 3.5–5.1)
Sodium: 135 mmol/L (ref 135–145)
Total Bilirubin: 0.4 mg/dL (ref 0.3–1.2)
Total Protein: 7.2 g/dL (ref 6.5–8.1)

## 2020-03-18 LAB — CBC
HCT: 42.9 % (ref 39.0–52.0)
Hemoglobin: 13.4 g/dL (ref 13.0–17.0)
MCH: 27.8 pg (ref 26.0–34.0)
MCHC: 31.2 g/dL (ref 30.0–36.0)
MCV: 89 fL (ref 80.0–100.0)
Platelets: 214 10*3/uL (ref 150–400)
RBC: 4.82 MIL/uL (ref 4.22–5.81)
RDW: 14.2 % (ref 11.5–15.5)
WBC: 10.3 10*3/uL (ref 4.0–10.5)
nRBC: 0 % (ref 0.0–0.2)

## 2020-03-18 LAB — LIPASE, BLOOD: Lipase: 31 U/L (ref 11–51)

## 2020-03-18 MED ORDER — HYDROCODONE-ACETAMINOPHEN 5-325 MG PO TABS
1.0000 | ORAL_TABLET | ORAL | 0 refills | Status: DC | PRN
Start: 2020-03-18 — End: 2020-03-28

## 2020-03-18 NOTE — ED Provider Notes (Signed)
[facility] EMERGENCY DEPARTMENT Provider Note   CSN: [id number] Arrival date & time: 03/18/20  1643     History Chief Complaint  Patient presents with  . Abdominal Pain    [name] [name] is a 79 y.o. male.  The history is provided by the patient. No language interpreter was used.  Abdominal Pain Pain location:  RLQ Pain quality: aching, sharp and stabbing   Pain severity:  Severe Onset quality:  Sudden Duration:  1 day Timing:  Constant Progression:  Worsening Chronicity:  New Relieved by:  Nothing Worsened by:  Nothing Ineffective treatments:  None tried Associated symptoms: no vomiting   Risk factors: multiple surgeries   Risk factors: no alcohol abuse   Pt reports he had severe abdominal pain.  Pt reports he felt a large lump in his right groin area .  Pt reports area went down when he laid down for scan      Past Medical History:  Diagnosis Date  . AAA (abdominal aortic aneurysm) ([place])    needs yearly ultrasound  . Allergy   . Anemia   . Arthritis   . Asthma   . BCC (basal cell carcinoma) 08/18/1989   left shoulder blad, upper right arm, left arm beyond elbow, c&d  . BCC (basal cell carcinoma) 01/31/1992   Posterior neck, curetx3, 71f  . BCC (basal cell carcinoma) 11/22/2001   mid forehead, cx3, excision, right forearm, cx3, 551f . BCC (basal cell carcinoma) 10/09/2003   mid forehead, MOHs  . BCC (basal cell carcinoma) 08/15/2008   upper left back, biopsy  . BPH (benign prostatic hyperplasia)   . CAD (coronary artery disease)   . Cancer ([place]   skin cancer  . COPD (chronic obstructive pulmonary disease) ([place]  . Dysrhythmia    pt. states it can be fast at times  . GERD (gastroesophageal reflux disease)   . Glaucoma   . HOH (hard of hearing)   . Hypercholesterolemia   . Hypertension   . Impaired fasting glucose   . Low back pain   . Melanoma in situ ([place]05/25/2005   left chin, MOHs  . MI (myocardial infarction) ([place]  . SCC (squamous  cell carcinoma) 07/03/2014   in situ, behind left ear, cx3, cautery, 66f9f. SCC (squamous cell carcinoma) 07/03/2014   well diff, left forearm, biopsy, cx1, cautery  . SCC (squamous cell carcinoma) 07/20/2017   in situ, left upper arm, cx3, 66fu7f SCC (squamous cell carcinoma) 01/10/2019   in situ, left post shoulder, cx3, 66fu 109fSCC (squamous cell carcinoma) 11/22/2001   left forearm distal, left forearm, cx3, 66fu  68fCC (squamous cell carcinoma) 10/09/2003   Bowens, left ear post, clear per st, right cheek clear  . SCC (squamous cell carcinoma) 03/30/2004   in situ, left upper arm, cx3, 66fu  .46fC (squamous cell carcinoma) 03/08/2005   in situ, right cheek, mid upper forehead, cx3, 66fu  . 71f (squamous cell carcinoma) 06/08/2006   in situ, left shoulder, cx3, 66fu  . S53f(squamous cell carcinoma) 05/05/2010   right inner wrist, biopsy  . SCC (squamous cell carcinoma) 09/13/2013   in situ, right crown scalp, front scalp, biopsy  . Thrush     Patient Active Problem List   Diagnosis Date Noted  . Nocturia 01/08/2020  . Pancreatitis 11/11/2019  . HOH (hard of hearing)   . Recurrent epistaxis 06/01/2017  . Pleuritic chest pain 09/16/2016  . HTN (  hypertension) 09/16/2016  . Herpes zoster without complication 78/24/2353  . Oral candidiasis 04/01/2016  . Prostate hypertrophy 03/06/2016  . Depression 04/06/2015  . Ulnar neuropathy 12/14/2014  . Leg swelling 12/05/2014  . Neuropathic pain 12/05/2014  . S/P spinal surgery 12/05/2014  . Venous stasis dermatitis 12/05/2014  . Insomnia 12/05/2014  . Generalized pain   . UTI (lower urinary tract infection) 11/11/2014  . Thrush of mouth and esophagus ([place]) 11/11/2014  . Urinary retention   . Urinary tract infectious disease   . Lumbar spondylosis 11/05/2014  . Colon neoplasm 06/11/2013  . GERD (gastroesophageal reflux disease) 01/19/2012  . PAC (premature atrial contraction) 09/15/2010  . Mixed hyperlipidemia 04/15/2008  . BEN  HTN HEART DISEASE WITHOUT HEART FAIL 04/15/2008  . Coronary atherosclerosis 04/15/2008  . Abdominal aortic aneurysm ([place]) 04/15/2008  . COPD exacerbation ([place]) 04/15/2008  . History of MI (myocardial infarction) 1999    Past Surgical History:  Procedure Laterality Date  . BACK SURGERY     x 3  . CARDIAC CATHETERIZATION     angioplasty  . CATARACT EXTRACTION W/PHACO  03/20/2012   Procedure: CATARACT EXTRACTION PHACO AND INTRAOCULAR LENS PLACEMENT (IOC);  Surgeon: Williams Che, MD;  Location: [facility];  Service: Ophthalmology;  Laterality: Right;  CDE:  8.45  . CATARACT EXTRACTION W/PHACO Left 04/02/2013   Procedure: CATARACT EXTRACTION PHACO AND INTRAOCULAR LENS PLACEMENT (IOC);  Surgeon: Williams Che, MD;  Location: [facility];  Service: Ophthalmology;  Laterality: Left;  CDE:  6.50  . CHOLECYSTECTOMY  2000  . COLONOSCOPY  2009   repeat 5 years  . ESOPHAGOGASTRODUODENOSCOPY    . HERNIA REPAIR Left    inguinal  . LAPAROSCOPIC PARTIAL COLECTOMY N/A 06/11/2013   Procedure: LAPAROSCOPIC HAND ASSISTED PARTIAL COLECTOMY;  Surgeon: Jamesetta So, MD;  Location: [facility];  Service: General;  Laterality: N/A;  . NASAL ENDOSCOPY WITH EPISTAXIS CONTROL Bilateral 02/11/2020   Procedure: NASAL ENDOSCOPY WITH EPISTAXIS CONTROL;  Surgeon: Leta Baptist, MD;  Location: [place];  Service: ENT;  Laterality: Bilateral;  . right eye detached retina Bilateral   . SPINAL FUSION  2016  . YAG LASER APPLICATION Left 61/44/3154   Procedure: YAG LASER APPLICATION;  Surgeon: Williams Che, MD;  Location: [facility];  Service: Ophthalmology;  Laterality: Left;       Family History  Problem Relation Age of Onset  . Hypertension Mother   . COPD Father   . Cancer Brother        brain    Social History   Tobacco Use  . Smoking status: Former Smoker    Packs/day: 1.00    Years: 35.00    Pack years: 35.00    Types: Cigarettes    Quit date: 03/28/2003    Years since quitting: 16.9  .  Smokeless tobacco: Never Used  Vaping Use  . Vaping Use: Never used  Substance Use Topics  . Alcohol use: No    Alcohol/week: 0.0 standard drinks  . Drug use: No    Home Medications Prior to Admission medications   Medication Sig Start Date End Date Taking? Authorizing Provider  albuterol (PROVENTIL) (2.5 MG/3ML) 0.083% nebulizer solution INHALE 1 VIAL VIA NEBULIZER FOUR TIMES DAILY Patient taking differently: Take 2.5 mg by nebulization every 6 (six) hours as needed for wheezing or shortness of breath.  10/11/19   Mikey Kirschner, MD  alfuzosin (UROXATRAL) 10 MG 24 hr tablet Take 1 tablet (10 mg total) by mouth daily with  breakfast. 01/08/20   McKenzie, Candee Furbish, MD  cetirizine (ZYRTEC) 10 MG tablet Take 10 mg by mouth daily.    [provider]  Clobetasol Prop Emollient Base (CLOBETASOL PROPIONATE E) 0.05 % emollient cream Apply 1 application topically daily. Apply to affected area after bathing x 3 weeks 01/30/20   Lavonna Monarch, MD  dorzolamide-timolol (COSOPT) 22.3-6.8 MG/ML ophthalmic solution Place 1 drop into both eyes 2 (two) times daily.  09/25/19   [provider]  esomeprazole (NEXIUM) 20 MG capsule Take 20 mg by mouth daily at 12 noon.    [provider]  fluticasone (FLOVENT HFA) 220 MCG/ACT inhaler Inhale 2 puffs into the lungs 2 (two) times daily.    [provider]  hydrocortisone 2.5 % ointment Apply topically 2 (two) times daily. Ok for face 02/14/20   Lavonna Monarch, MD  LORazepam (ATIVAN) 0.5 MG tablet Take 1 tablet (0.5 mg total) by mouth at bedtime. 02/12/20   Lovena Le, Malena M, DO  losartan (COZAAR) 25 MG tablet Take 1 tablet (25 mg total) by mouth daily. 08/13/19   Mikey Kirschner, MD  lovastatin (MEVACOR) 40 MG tablet Take 1 tablet (40 mg total) by mouth daily. 08/13/19   Mikey Kirschner, MD  nitroGLYCERIN (NITROSTAT) 0.4 MG SL tablet Place 1 tablet (0.4 mg total) under the tongue every 5 (five) minutes as needed for chest pain.  02/26/19   Josue Hector, MD  predniSONE (STERAPRED UNI-PAK 21 TAB) 10 MG (21) TBPK tablet Take by mouth. 03/05/20   [provider]  PROAIR HFA 108 (90 Base) MCG/ACT inhaler INHALE 2 PUFFS INTO THE LUNGS EVERY SIX HOURS AS NEEDED FOR WHEEZING. 01/05/19   Mikey Kirschner, MD  sulfamethoxazole-trimethoprim (BACTRIM DS) 800-160 MG tablet Take 1 tablet by mouth 2 (two) times daily. 03/06/20   Chalmers Guest, NP    Allergies    Lasix [furosemide], Cefzil [cefprozil], Dexamethasone, Gabapentin, Neomycin, Tetracyclines & related, Ciprofloxacin, Methocarbamol, and Penicillins  Review of Systems   Review of Systems  Gastrointestinal: Positive for abdominal pain. Negative for vomiting.  All other systems reviewed and are negative.   Physical Exam Updated Vital Signs BP 118/72   Pulse 80   Temp 98.6 F (37 C) (Oral)   Resp 18   Ht _0  (1.854 m)   SpO2 98%   BMI 25.36 kg/m   Physical Exam Vitals and nursing note reviewed.  Constitutional:      Appearance: He is well-developed.  HENT:     Head: Normocephalic and atraumatic.  Eyes:     Conjunctiva/sclera: Conjunctivae normal.  Cardiovascular:     Rate and Rhythm: Normal rate and regular rhythm.     Heart sounds: No murmur heard.   Pulmonary:     Effort: Pulmonary effort is normal. No respiratory distress.     Breath sounds: Normal breath sounds.  Abdominal:     General: Abdomen is flat. Bowel sounds are normal.     Palpations: Abdomen is soft.     Tenderness: There is no abdominal tenderness.     Hernia: A hernia is present. Hernia is present in the right inguinal area.  Musculoskeletal:     Cervical back: Neck supple.  Skin:    General: Skin is warm and dry.  Neurological:     General: No focal deficit present.     Mental Status: He is alert.  Psychiatric:        Mood and Affect: Mood normal.     ED  Results / Procedures / Treatments   Labs (all labs ordered are listed, but only abnormal results are  displayed) Labs Reviewed  COMPREHENSIVE METABOLIC PANEL - Abnormal; Notable for the following components:      Result Value   Glucose, Bld 110 (*)    Calcium 8.7 (*)    All other components within normal limits  LIPASE, BLOOD  CBC  URINALYSIS, ROUTINE W REFLEX MICROSCOPIC    EKG None  Radiology CT ABDOMEN PELVIS WO CONTRAST  Result Date: 03/18/2020 CLINICAL DATA:  Sudden onset right inguinal pain since 4 p.m., history of prior umbilical and bilateral inguinal hernias EXAM: CT ABDOMEN AND PELVIS WITHOUT CONTRAST TECHNIQUE: Multidetector CT imaging of the abdomen and pelvis was performed following the standard protocol without IV contrast. COMPARISON:  11/13/2019, 11/11/2019 FINDINGS: Lower chest: There is mild right lower lobe bronchial wall thickening with patchy tree in bud ground-glass airspace disease, favor inflammation, chronic indolent infection, or less likely aspiration. No effusion or pneumothorax. Hepatobiliary: No focal liver abnormality is seen. Status post cholecystectomy. No biliary dilatation. Pancreas: Unremarkable. No pancreatic ductal dilatation or surrounding inflammatory changes. Spleen: Normal in size without focal abnormality. Adrenals/Urinary Tract: Punctate less than 2 mm nonobstructing calculi lower pole right kidney. Stable upper pole right renal cyst. Left kidney is unremarkable. The adrenals and bladder are normal. Stomach/Bowel: There is extensive diverticulosis of the distal colon, with no evidence of diverticulitis. Postsurgical changes are seen from proximal colectomy and ileocolic anastomosis in the right upper quadrant. No bowel obstruction or ileus. No bowel wall thickening or inflammatory change. Vascular/Lymphatic: Stable 4.9 cm infrarenal abdominal aortic aneurysm. Evaluation of the aortic lumen is limited by the lack of IV contrast. Stable diffuse aortic atherosclerosis. No pathologic adenopathy. Reproductive: Stable mild enlargement of the prostate. Other:  There is a right inguinal hernia. A small portion of distal small bowel extends into the proximal aspect of the hernia sac, with no evidence of obstruction or incarceration. No free fluid or free gas. Musculoskeletal: No acute or destructive bony lesions. Stable postsurgical changes of the lower lumbar spine. Stable postsurgical changes at L5/S1. Stable spondylosis at L2/L3 and L3/L4. Reconstructed images demonstrate no additional findings. IMPRESSION: 1. Right inguinal hernia, containing a small portion of distal small bowel within the proximal aspect of the hernia sac. No evidence of bowel incarceration or obstruction. 2. Punctate nonobstructing right renal calculi. 3. Stable 4.9 cm infrarenal abdominal aortic aneurysm. Recommend follow-up CT/MR every 6 months and vascular consultation. This recommendation follows ACR consensus guidelines: White Paper of the ACR Incidental Findings Committee II on Vascular Findings. J Am Coll Radiol 2013; 10:789-794. 4. Mild right lower lobe bronchial wall thickening with scattered tree-in-bud ground-glass opacities, consistent with inflammatory change, indolent infection such as MAC, or less likely aspiration. Electronically Signed   By: Randa Ngo M.D.   On: 03/18/2020 18:07    Procedures Procedures (including critical care time)  Medications Ordered in ED Medications - No data to display  ED Course  I have reviewed the triage vital signs and the nursing notes.  Pertinent labs & imaging results that were available during my care of the patient were reviewed by me and considered in my medical decision making (see chart for details).    MDM Rules/Calculators/A&P                          MDM:  Pt given rx for hydrocodone.  I spoke to Dr. Arnoldo Morale who will follow up  with pt.  Pt to call tomorrow.  He will see pt on Thursday/  Pt advised of need to return if area come out and does not reduce with lying down and relaxing Final Clinical Impression(s) / ED  Diagnoses Final diagnoses:  Hernia, inguinal, right    Rx / DC Orders ED Discharge Orders    None    An After Visit Summary was printed and given to the patient.    Sidney Ace 03/18/20 2107    Isla Pence, MD 03/18/20 2308

## 2020-03-19 ENCOUNTER — Other Ambulatory Visit: Payer: Medicare Other | Admitting: Urology

## 2020-03-20 ENCOUNTER — Encounter: Payer: Self-pay | Admitting: General Surgery

## 2020-03-20 ENCOUNTER — Ambulatory Visit (INDEPENDENT_AMBULATORY_CARE_PROVIDER_SITE_OTHER): Payer: Medicare Other | Admitting: General Surgery

## 2020-03-20 ENCOUNTER — Other Ambulatory Visit: Payer: Self-pay

## 2020-03-20 VITALS — BP 113/79 | HR 84 | Temp 98.1°F | Resp 14 | Ht 73.0 in | Wt 194.0 lb

## 2020-03-20 DIAGNOSIS — K4090 Unilateral inguinal hernia, without obstruction or gangrene, not specified as recurrent: Secondary | ICD-10-CM | POA: Diagnosis not present

## 2020-03-20 DIAGNOSIS — I2510 Atherosclerotic heart disease of native coronary artery without angina pectoris: Secondary | ICD-10-CM

## 2020-03-20 NOTE — H&P (Signed)
[name] [name]; [id number]; [birth year]   HPI Patient is a 79 year old white male who was referred to my care by [name] and the emergency room for evaluation and treatment of an incarcerated right inguinal hernia. Patient states he has had the hernia for some time now, but had to go the emergency room due to significant right groin pain, swelling, nausea. He was found to have an incarcerated right inguinal hernia but it reduced while undergoing CT scan of the abdomen. He also has an upper midline incisional hernia from his previous colectomy. This is not bothering. He is status post a left inguinal herniorrhaphy in the remote past and was told he has a possible hernia on that side. Since his discharge from the emergency room, he has had intermittent swelling and pain in the right groin region, but his nausea and vomiting have resolved. He currently has no hernia pain. Past Medical History:  Diagnosis Date  . AAA (abdominal aortic aneurysm) ([place])    needs yearly ultrasound  . Allergy   . Anemia   . Arthritis   . Asthma   . BCC (basal cell carcinoma) 08/18/1989   left shoulder blad, upper right arm, left arm beyond elbow, c&d  . BCC (basal cell carcinoma) 01/31/1992   Posterior neck, curetx3, 38fu  . BCC (basal cell carcinoma) 11/22/2001   mid forehead, cx3, excision, right forearm, cx3, 7fu  . BCC (basal cell carcinoma) 10/09/2003   mid forehead, MOHs  . BCC (basal cell carcinoma) 08/15/2008   upper left back, biopsy  . BPH (benign prostatic hyperplasia)   . CAD (coronary artery disease)   . Cancer ([place])    skin cancer  . COPD (chronic obstructive pulmonary disease) ([place])   . Dysrhythmia    pt. states it can be fast at times  . GERD (gastroesophageal reflux disease)   . Glaucoma   . HOH (hard of hearing)   . Hypercholesterolemia   . Hypertension   . Impaired fasting glucose   . Low back pain   . Melanoma in situ ([place]) 10/09/2003   left chin, MOHs  . MI (myocardial infarction)  ([place]) 1999  . SCC (squamous cell carcinoma) 07/03/2014   in situ, behind left ear, cx3, cautery, 4fu  . SCC (squamous cell carcinoma) 07/03/2014   well diff, left forearm, biopsy, cx1, cautery  . SCC (squamous cell carcinoma) 07/20/2017   in situ, left upper arm, cx3, 91fu  . SCC (squamous cell carcinoma) 01/10/2019   in situ, left post shoulder, cx3, 81fu  . SCC (squamous cell carcinoma) 11/22/2001   left forearm distal, left forearm, cx3, 62fu  . SCC (squamous cell carcinoma) 10/09/2003   Bowens, left ear post, clear per st, right cheek clear  . SCC (squamous cell carcinoma) 03/30/2004   in situ, left upper arm, cx3, 39fu  . SCC (squamous cell carcinoma) 03/08/2005   in situ, right cheek, mid upper forehead, cx3, 7fu  . SCC (squamous cell carcinoma) 06/08/2006   in situ, left shoulder, cx3, 13fu  . SCC (squamous cell carcinoma) 05/05/2010   right inner wrist, biopsy  . SCC (squamous cell carcinoma) 09/13/2013   in situ, right crown scalp, front scalp, biopsy  . Thrush     Past Surgical History:  Procedure Laterality Date  . BACK SURGERY     x 3  . CARDIAC CATHETERIZATION     angioplasty  . CATARACT EXTRACTION W/PHACO  03/20/2012   Procedure: CATARACT EXTRACTION PHACO AND INTRAOCULAR LENS PLACEMENT (IOC);  Surgeon:  Williams Che, MD;  Location: [facility];  Service: Ophthalmology;  Laterality: Right;  CDE:  8.45  . CATARACT EXTRACTION W/PHACO Left 04/02/2013   Procedure: CATARACT EXTRACTION PHACO AND INTRAOCULAR LENS PLACEMENT (IOC);  Surgeon: Williams Che, MD;  Location: [facility];  Service: Ophthalmology;  Laterality: Left;  CDE:  6.50  . CHOLECYSTECTOMY  2000  . COLONOSCOPY  2009   repeat 5 years  . ESOPHAGOGASTRODUODENOSCOPY    . HERNIA REPAIR Left    inguinal  . LAPAROSCOPIC PARTIAL COLECTOMY N/A 06/11/2013   Procedure: LAPAROSCOPIC HAND ASSISTED PARTIAL COLECTOMY;  Surgeon: Jamesetta So, MD;  Location: [facility];  Service: General;  Laterality: N/A;  . NASAL ENDOSCOPY WITH  EPISTAXIS CONTROL Bilateral 02/11/2020   Procedure: NASAL ENDOSCOPY WITH EPISTAXIS CONTROL;  Surgeon: Leta Baptist, MD;  Location: [place];  Service: ENT;  Laterality: Bilateral;  . right eye detached retina Bilateral   . SPINAL FUSION  2016  . YAG LASER APPLICATION Left 87/86/7672   Procedure: YAG LASER APPLICATION;  Surgeon: Williams Che, MD;  Location: [facility];  Service: Ophthalmology;  Laterality: Left;    Family History  Problem Relation Age of Onset  . Hypertension Mother   . COPD Father   . Cancer Brother        brain    Current Outpatient Medications on File Prior to Visit  Medication Sig Dispense Refill  . albuterol (PROVENTIL) (2.5 MG/3ML) 0.083% nebulizer solution INHALE 1 VIAL VIA NEBULIZER FOUR TIMES DAILY (Patient taking differently: Take 2.5 mg by nebulization every 6 (six) hours as needed for wheezing or shortness of breath. ) 375 mL 6  . alfuzosin (UROXATRAL) 10 MG 24 hr tablet Take 1 tablet (10 mg total) by mouth daily with breakfast. 30 tablet 11  . cetirizine (ZYRTEC) 10 MG tablet Take 10 mg by mouth daily.    . dorzolamide-timolol (COSOPT) 22.3-6.8 MG/ML ophthalmic solution Place 1 drop into both eyes 2 (two) times daily.     Marland Kitchen esomeprazole (NEXIUM) 20 MG capsule Take 20 mg by mouth daily at 12 noon.    . fluticasone (FLOVENT HFA) 220 MCG/ACT inhaler Inhale 2 puffs into the lungs 2 (two) times daily.    Marland Kitchen LORazepam (ATIVAN) 0.5 MG tablet Take 1 tablet (0.5 mg total) by mouth at bedtime. 30 tablet 1  . losartan (COZAAR) 25 MG tablet Take 1 tablet (25 mg total) by mouth daily. 90 tablet 1  . lovastatin (MEVACOR) 40 MG tablet Take 1 tablet (40 mg total) by mouth daily. 30 tablet 5  . nitroGLYCERIN (NITROSTAT) 0.4 MG SL tablet Place 1 tablet (0.4 mg total) under the tongue every 5 (five) minutes as needed for chest pain. 25 tablet 5  . PROAIR HFA 108 (90 Base) MCG/ACT inhaler INHALE 2 PUFFS INTO THE LUNGS EVERY SIX HOURS AS NEEDED FOR WHEEZING. 8.5 g 2  .  HYDROcodone-acetaminophen (NORCO/VICODIN) 5-325 MG tablet Take 1 tablet by mouth every 4 (four) hours as needed. (Patient not taking: Reported on 03/20/2020) 10 tablet 0   No current facility-administered medications on file prior to visit.    Allergies  Allergen Reactions  . Lasix [Furosemide] Rash  . Cefzil [Cefprozil] Nausea Only  . Dexamethasone Swelling  . Gabapentin     Legs swell up   . Neomycin Other (See Comments)    Patient can't remember.  . Tetracyclines & Related Itching  . Ciprofloxacin Nausea And Vomiting, Rash and Other (See Comments)    Body aches   .  Methocarbamol Swelling and Rash  . Penicillins Rash    Has patient had a PCN reaction causing immediate rash, facial/tongue/throat swelling, SOB or lightheadedness with hypotension: yes Has patient had a PCN reaction causing severe rash involving mucus membranes or skin necrosis: unknown Has patient had a PCN reaction that required hospitalization: no Has patient had a PCN reaction occurring within the last 10 years: No If all of the above answers are "NO", then may proceed with Cephalosporin use.     Social History   Substance and Sexual Activity  Alcohol Use No  . Alcohol/week: 0.0 standard drinks    Social History   Tobacco Use  Smoking Status Former Smoker  . Packs/day: 1.00  . Years: 35.00  . Pack years: 35.00  . Types: Cigarettes  . Quit date: 03/28/2003  . Years since quitting: 16.9  Smokeless Tobacco Never Used    Review of Systems  Constitutional: Negative.   HENT: Positive for sinus pain.   Eyes: Positive for blurred vision.  Respiratory: Positive for cough and wheezing.   Cardiovascular: Negative.   Gastrointestinal: Positive for heartburn.  Genitourinary: Positive for frequency and urgency.  Skin: Negative.   Neurological: Negative.   Endo/Heme/Allergies: Negative.   Psychiatric/Behavioral: Negative.     Objective   Vitals:   03/20/20 1121  BP: 113/79  Pulse: 84  Resp: 14   Temp: 98.1 F (36.7 C)  SpO2: 98%    Physical Exam Vitals reviewed.  Constitutional:      Appearance: Normal appearance. He is not ill-appearing.  HENT:     Head: Normocephalic and atraumatic.  Cardiovascular:     Rate and Rhythm: Normal rate and regular rhythm.     Heart sounds: Normal heart sounds. No murmur heard.  No friction rub. No gallop.   Pulmonary:     Effort: Pulmonary effort is normal. No respiratory distress.     Breath sounds: No stridor. Rhonchi present. No wheezing or rales.     Comments: Occasional rhonchi noted. Abdominal:     General: There is no distension.     Palpations: Abdomen is soft. There is no mass.     Tenderness: There is no abdominal tenderness. There is no guarding or rebound.     Comments: A right inguinal hernia is noted with reducible contents present. I could not appreciate a left inguinal hernia. He does have an approximately 3 to 4 cm easily reducible incisional hernia along the superior aspect of the midline incision.  Genitourinary:    Comments: Genitourinary examination is within normal limits. Skin:    General: Skin is warm and dry.  Neurological:     Mental Status: He is alert and oriented to person, place, and time.   ER notes and CT scan reports reviewed  Assessment  Right inguinal hernia Plan   Patient is scheduled for right inguinal herniorrhaphy with mesh on 03/28/2020. The risks and benefits of the procedure including bleeding, infection, mesh use, and the possibility of recurrence of the hernia were fully explained to the patient, who gave informed consent.

## 2020-03-20 NOTE — Progress Notes (Signed)
[name] [name]; [id number]; 1940/06/12   HPI Patient is a 79 year old white male who was referred to my care by [name] and the emergency room for evaluation and treatment of an incarcerated right inguinal hernia. Patient states he has had the hernia for some time now, but had to go the emergency room due to significant right groin pain, swelling, nausea. He was found to have an incarcerated right inguinal hernia but it reduced while undergoing CT scan of the abdomen. He also has an upper midline incisional hernia from his previous colectomy. This is not bothering. He is status post a left inguinal herniorrhaphy in the remote past and was told he has a possible hernia on that side. Since his discharge from the emergency room, he has had intermittent swelling and pain in the right groin region, but his nausea and vomiting have resolved. He currently has no hernia pain. Past Medical History:  Diagnosis Date  . AAA (abdominal aortic aneurysm) ([place])    needs yearly ultrasound  . Allergy   . Anemia   . Arthritis   . Asthma   . BCC (basal cell carcinoma) 08/18/1989   left shoulder blad, upper right arm, left arm beyond elbow, c&d  . BCC (basal cell carcinoma) 01/31/1992   Posterior neck, curetx3, 50fu  . BCC (basal cell carcinoma) 11/22/2001   mid forehead, cx3, excision, right forearm, cx3, 75fu  . BCC (basal cell carcinoma) 10/09/2003   mid forehead, MOHs  . BCC (basal cell carcinoma) 08/15/2008   upper left back, biopsy  . BPH (benign prostatic hyperplasia)   . CAD (coronary artery disease)   . Cancer ([place])    skin cancer  . COPD (chronic obstructive pulmonary disease) ([place])   . Dysrhythmia    pt. states it can be fast at times  . GERD (gastroesophageal reflux disease)   . Glaucoma   . HOH (hard of hearing)   . Hypercholesterolemia   . Hypertension   . Impaired fasting glucose   . Low back pain   . Melanoma in situ ([place]) 10/09/2003   left chin, MOHs  . MI (myocardial infarction)  ([place]) 1999  . SCC (squamous cell carcinoma) 07/03/2014   in situ, behind left ear, cx3, cautery, 50fu  . SCC (squamous cell carcinoma) 07/03/2014   well diff, left forearm, biopsy, cx1, cautery  . SCC (squamous cell carcinoma) 07/20/2017   in situ, left upper arm, cx3, 74fu  . SCC (squamous cell carcinoma) 01/10/2019   in situ, left post shoulder, cx3, 51fu  . SCC (squamous cell carcinoma) 11/22/2001   left forearm distal, left forearm, cx3, 44fu  . SCC (squamous cell carcinoma) 10/09/2003   [place], left ear post, clear per st, right cheek clear  . SCC (squamous cell carcinoma) 03/30/2004   in situ, left upper arm, cx3, 84fu  . SCC (squamous cell carcinoma) 03/08/2005   in situ, right cheek, mid upper forehead, cx3, 107fu  . SCC (squamous cell carcinoma) 06/08/2006   in situ, left shoulder, cx3, 36fu  . SCC (squamous cell carcinoma) 05/05/2010   right inner wrist, biopsy  . SCC (squamous cell carcinoma) 09/13/2013   in situ, right crown scalp, front scalp, biopsy  . Thrush     Past Surgical History:  Procedure Laterality Date  . BACK SURGERY     x 3  . CARDIAC CATHETERIZATION     angioplasty  . CATARACT EXTRACTION W/PHACO  03/20/2012   Procedure: CATARACT EXTRACTION PHACO AND INTRAOCULAR LENS PLACEMENT (IOC);  Surgeon:  Williams Che, MD;  Location: [facility];  Service: Ophthalmology;  Laterality: Right;  CDE:  8.45  . CATARACT EXTRACTION W/PHACO Left 04/02/2013   Procedure: CATARACT EXTRACTION PHACO AND INTRAOCULAR LENS PLACEMENT (IOC);  Surgeon: Williams Che, MD;  Location: [facility];  Service: Ophthalmology;  Laterality: Left;  CDE:  6.50  . CHOLECYSTECTOMY  2000  . COLONOSCOPY  2009   repeat 5 years  . ESOPHAGOGASTRODUODENOSCOPY    . HERNIA REPAIR Left    inguinal  . LAPAROSCOPIC PARTIAL COLECTOMY N/A 06/11/2013   Procedure: LAPAROSCOPIC HAND ASSISTED PARTIAL COLECTOMY;  Surgeon: Jamesetta So, MD;  Location: [facility];  Service: General;  Laterality: N/A;  . NASAL ENDOSCOPY WITH  EPISTAXIS CONTROL Bilateral 02/11/2020   Procedure: NASAL ENDOSCOPY WITH EPISTAXIS CONTROL;  Surgeon: Leta Baptist, MD;  Location: [facility];  Service: ENT;  Laterality: Bilateral;  . right eye detached retina Bilateral   . SPINAL FUSION  2016  . YAG LASER APPLICATION Left 00/45/9977   Procedure: YAG LASER APPLICATION;  Surgeon: Williams Che, MD;  Location: [facility];  Service: Ophthalmology;  Laterality: Left;    Family History  Problem Relation Age of Onset  . Hypertension Mother   . COPD Father   . Cancer Brother        brain    Current Outpatient Medications on File Prior to Visit  Medication Sig Dispense Refill  . albuterol (PROVENTIL) (2.5 MG/3ML) 0.083% nebulizer solution INHALE 1 VIAL VIA NEBULIZER FOUR TIMES DAILY (Patient taking differently: Take 2.5 mg by nebulization every 6 (six) hours as needed for wheezing or shortness of breath. ) 375 mL 6  . alfuzosin (UROXATRAL) 10 MG 24 hr tablet Take 1 tablet (10 mg total) by mouth daily with breakfast. 30 tablet 11  . cetirizine (ZYRTEC) 10 MG tablet Take 10 mg by mouth daily.    . dorzolamide-timolol (COSOPT) 22.3-6.8 MG/ML ophthalmic solution Place 1 drop into both eyes 2 (two) times daily.     Marland Kitchen esomeprazole (NEXIUM) 20 MG capsule Take 20 mg by mouth daily at 12 noon.    . fluticasone (FLOVENT HFA) 220 MCG/ACT inhaler Inhale 2 puffs into the lungs 2 (two) times daily.    Marland Kitchen LORazepam (ATIVAN) 0.5 MG tablet Take 1 tablet (0.5 mg total) by mouth at bedtime. 30 tablet 1  . losartan (COZAAR) 25 MG tablet Take 1 tablet (25 mg total) by mouth daily. 90 tablet 1  . lovastatin (MEVACOR) 40 MG tablet Take 1 tablet (40 mg total) by mouth daily. 30 tablet 5  . nitroGLYCERIN (NITROSTAT) 0.4 MG SL tablet Place 1 tablet (0.4 mg total) under the tongue every 5 (five) minutes as needed for chest pain. 25 tablet 5  . PROAIR HFA 108 (90 Base) MCG/ACT inhaler INHALE 2 PUFFS INTO THE LUNGS EVERY SIX HOURS AS NEEDED FOR WHEEZING. 8.5 g 2  .  HYDROcodone-acetaminophen (NORCO/VICODIN) 5-325 MG tablet Take 1 tablet by mouth every 4 (four) hours as needed. (Patient not taking: Reported on 03/20/2020) 10 tablet 0   No current facility-administered medications on file prior to visit.    Allergies  Allergen Reactions  . Lasix [Furosemide] Rash  . Cefzil [Cefprozil] Nausea Only  . Dexamethasone Swelling  . Gabapentin     Legs swell up   . Neomycin Other (See Comments)    Patient can't remember.  . Tetracyclines & Related Itching  . Ciprofloxacin Nausea And Vomiting, Rash and Other (See Comments)    Body aches   .  Methocarbamol Swelling and Rash  . Penicillins Rash    Has patient had a PCN reaction causing immediate rash, facial/tongue/throat swelling, SOB or lightheadedness with hypotension: yes Has patient had a PCN reaction causing severe rash involving mucus membranes or skin necrosis: unknown Has patient had a PCN reaction that required hospitalization: no Has patient had a PCN reaction occurring within the last 10 years: No If all of the above answers are "NO", then may proceed with Cephalosporin use.     Social History   Substance and Sexual Activity  Alcohol Use No  . Alcohol/week: 0.0 standard drinks    Social History   Tobacco Use  Smoking Status Former Smoker  . Packs/day: 1.00  . Years: 35.00  . Pack years: 35.00  . Types: Cigarettes  . Quit date: 03/28/2003  . Years since quitting: 16.9  Smokeless Tobacco Never Used    Review of Systems  Constitutional: Negative.   HENT: Positive for sinus pain.   Eyes: Positive for blurred vision.  Respiratory: Positive for cough and wheezing.   Cardiovascular: Negative.   Gastrointestinal: Positive for heartburn.  Genitourinary: Positive for frequency and urgency.  Skin: Negative.   Neurological: Negative.   Endo/Heme/Allergies: Negative.   Psychiatric/Behavioral: Negative.     Objective   Vitals:   03/20/20 1121  BP: 113/79  Pulse: 84  Resp: 14   Temp: 98.1 F (36.7 C)  SpO2: 98%    Physical Exam Vitals reviewed.  Constitutional:      Appearance: Normal appearance. He is not ill-appearing.  HENT:     Head: Normocephalic and atraumatic.  Cardiovascular:     Rate and Rhythm: Normal rate and regular rhythm.     Heart sounds: Normal heart sounds. No murmur heard.  No friction rub. No gallop.   Pulmonary:     Effort: Pulmonary effort is normal. No respiratory distress.     Breath sounds: No stridor. Rhonchi present. No wheezing or rales.     Comments: Occasional rhonchi noted. Abdominal:     General: There is no distension.     Palpations: Abdomen is soft. There is no mass.     Tenderness: There is no abdominal tenderness. There is no guarding or rebound.     Comments: A right inguinal hernia is noted with reducible contents present. I could not appreciate a left inguinal hernia. He does have an approximately 3 to 4 cm easily reducible incisional hernia along the superior aspect of the midline incision.  Genitourinary:    Comments: Genitourinary examination is within normal limits. Skin:    General: Skin is warm and dry.  Neurological:     Mental Status: He is alert and oriented to person, place, and time.   ER notes and CT scan reports reviewed  Assessment  Right inguinal hernia Plan   Patient is scheduled for right inguinal herniorrhaphy with mesh on 03/28/2020. The risks and benefits of the procedure including bleeding, infection, mesh use, and the possibility of recurrence of the hernia were fully explained to the patient, who gave informed consent.

## 2020-03-20 NOTE — Patient Instructions (Signed)

## 2020-03-24 NOTE — Patient Instructions (Signed)
[name] [name]  03/24/2020     @PREFPERIOPPHARMACY @   Your procedure is scheduled on 03/28/2020.  Report to [facility] at  [phone]  A.M.  Call this number if you have problems the morning of surgery:  [phone]   Remember:  Do not eat or drink after midnight.                         Take these medicines the morning of surgery with A SIP OF WATER  Zyrtec, nexium. Use your nebulizer and your inhaler    Do not wear jewelry, make-up or nail polish.  Do not wear lotions, powders, or perfumes, or deodorant.  Do not shave 48 hours prior to surgery.  Men may shave face and neck.  Do not bring valuables to the hospital.  [facility] is not responsible for any belongings or valuables.  Contacts, dentures or bridgework may not be worn into surgery.  Leave your suitcase in the car.  After surgery it may be brought to your room.  For patients admitted to the hospital, discharge time will be determined by your treatment team.  Patients discharged the day of surgery will not be allowed to drive home.   Name and phone number of your driver:   family Special instructions:   DO NOT smoke the morning of your procedure.  Please read over the following fact sheets that you were given. Anesthesia Post-op Instructions and Care and Recovery After Surgery       Open Hernia Repair, Adult, Care After These instructions give you information about caring for yourself after your procedure. Your doctor may also give you more specific instructions. If you have problems or questions, contact your doctor. Follow these instructions at home: Surgical cut (incision) care   Follow instructions from your doctor about how to take care of your surgical cut area. Make sure you: ? Wash your hands with soap and water before you change your bandage (dressing). If you cannot use soap and water, use hand sanitizer. ? Change your bandage as told by your doctor. ? Leave stitches (sutures), skin glue, or  skin tape (adhesive) strips in place. They may need to stay in place for 2 weeks or longer. If tape strips get loose and curl up, you may trim the loose edges. Do not remove tape strips completely unless your doctor says it is okay.  Check your surgical cut every day for signs of infection. Check for: ? More redness, swelling, or pain. ? More fluid or blood. ? Warmth. ? Pus or a bad smell. Activity  Do not drive or use heavy machinery while taking prescription pain medicine. Do not drive until your doctor says it is okay.  Until your doctor says it is okay: ? Do not lift anything that is heavier than 10 lb (4.5 kg). ? Do not play contact sports.  Return to your normal activities as told by your doctor. Ask your doctor what activities are safe. General instructions  To prevent or treat having a hard time pooping (constipation) while you are taking prescription pain medicine, your doctor may recommend that you: ? Drink enough fluid to keep your pee (urine) clear or pale yellow. ? Take over-the-counter or prescription medicines. ? Eat foods that are high in fiber, such as fresh fruits and vegetables, whole grains, and beans. ? Limit foods that are high in fat and processed sugars, such as fried  and sweet foods.  Take over-the-counter and prescription medicines only as told by your doctor.  Do not take baths, swim, or use a hot tub until your doctor says it is okay.  Keep all follow-up visits as told by your doctor. This is important. Contact a doctor if:  You develop a rash.  You have more redness, swelling, or pain around your surgical cut.  You have more fluid or blood coming from your surgical cut.  Your surgical cut feels warm to the touch.  You have pus or a bad smell coming from your surgical cut.  You have a fever or chills.  You have blood in your poop (stool).  You have not pooped in 2-3 days.  Medicine does not help your pain. Get help right away if:  You have  chest pain or you are short of breath.  You feel light-headed.  You feel weak and dizzy (feel faint).  You have very bad pain.  You throw up (vomit) and your pain is worse. This information is not intended to replace advice given to you by your health care provider. Make sure you discuss any questions you have with your health care provider. Document Revised: 08/25/2018 Document Reviewed: 10/15/2015 Elsevier Patient Education  2020 [place] Anesthesia, Adult, Care After This sheet gives you information about how to care for yourself after your procedure. Your health care provider may also give you more specific instructions. If you have problems or questions, contact your health care provider. What can I expect after the procedure? After the procedure, the following side effects are common:  Pain or discomfort at the IV site.  Nausea.  Vomiting.  Sore throat.  Trouble concentrating.  Feeling cold or chills.  Weak or tired.  Sleepiness and fatigue.  Soreness and body aches. These side effects can affect parts of the body that were not involved in surgery. Follow these instructions at home:  For at least 24 hours after the procedure:  Have a responsible adult stay with you. It is important to have someone help care for you until you are awake and alert.  Rest as needed.  Do not: ? Participate in activities in which you could fall or become injured. ? Drive. ? Use heavy machinery. ? Drink alcohol. ? Take sleeping pills or medicines that cause drowsiness. ? Make important decisions or sign legal documents. ? Take care of children on your own. Eating and drinking  Follow any instructions from your health care provider about eating or drinking restrictions.  When you feel hungry, start by eating small amounts of foods that are soft and easy to digest (bland), such as toast. Gradually return to your regular diet.  Drink enough fluid to keep your urine  pale yellow.  If you vomit, rehydrate by drinking water, juice, or clear broth. General instructions  If you have sleep apnea, surgery and certain medicines can increase your risk for breathing problems. Follow instructions from your health care provider about wearing your sleep device: ? Anytime you are sleeping, including during daytime naps. ? While taking prescription pain medicines, sleeping medicines, or medicines that make you drowsy.  Return to your normal activities as told by your health care provider. Ask your health care provider what activities are safe for you.  Take over-the-counter and prescription medicines only as told by your health care provider.  If you smoke, do not smoke without supervision.  Keep all follow-up visits as told by your health care provider.  This is important. Contact a health care provider if:  You have nausea or vomiting that does not get better with medicine.  You cannot eat or drink without vomiting.  You have pain that does not get better with medicine.  You are unable to pass urine.  You develop a skin rash.  You have a fever.  You have redness around your IV site that gets worse. Get help right away if:  You have difficulty breathing.  You have chest pain.  You have blood in your urine or stool, or you vomit blood. Summary  After the procedure, it is common to have a sore throat or nausea. It is also common to feel tired.  Have a responsible adult stay with you for the first 24 hours after general anesthesia. It is important to have someone help care for you until you are awake and alert.  When you feel hungry, start by eating small amounts of foods that are soft and easy to digest (bland), such as toast. Gradually return to your regular diet.  Drink enough fluid to keep your urine pale yellow.  Return to your normal activities as told by your health care provider. Ask your health care provider what activities are safe for  you. This information is not intended to replace advice given to you by your health care provider. Make sure you discuss any questions you have with your health care provider. Document Revised: 05/06/2017 Document Reviewed: 12/17/2016 Elsevier Patient Education  [place]. How to Use Chlorhexidine for Bathing Chlorhexidine gluconate (CHG) is a germ-killing (antiseptic) solution that is used to clean the skin. It can get rid of the bacteria that normally live on the skin and can keep them away for about 24 hours. To clean your skin with CHG, you may be given:  A CHG solution to use in the shower or as part of a sponge bath.  A prepackaged cloth that contains CHG. Cleaning your skin with CHG may help lower the risk for infection:  While you are staying in the intensive care unit of the hospital.  If you have a vascular access, such as a central line, to provide short-term or long-term access to your veins.  If you have a catheter to drain urine from your bladder.  If you are on a ventilator. A ventilator is a machine that helps you breathe by moving air in and out of your lungs.  After surgery. What are the risks? Risks of using CHG include:  A skin reaction.  Hearing loss, if CHG gets in your ears.  Eye injury, if CHG gets in your eyes and is not rinsed out.  The CHG product catching fire. Make sure that you avoid smoking and flames after applying CHG to your skin. Do not use CHG:  If you have a chlorhexidine allergy or have previously reacted to chlorhexidine.  On babies younger than 42 months of age. How to use CHG solution  Use CHG only as told by your health care provider, and follow the instructions on the label.  Use the full amount of CHG as directed. Usually, this is one bottle. During a shower Follow these steps when using CHG solution during a shower (unless your health care provider gives you different instructions): 1. Start the shower. 2. Use your  normal soap and shampoo to wash your face and hair. 3. Turn off the shower or move out of the shower stream. 4. Pour the CHG onto a clean washcloth. Do  not use any type of brush or rough-edged sponge. 5. Starting at your neck, lather your body down to your toes. Make sure you follow these instructions: ? If you will be having surgery, pay special attention to the part of your body where you will be having surgery. Scrub this area for at least 1 minute. ? Do not use CHG on your head or face. If the solution gets into your ears or eyes, rinse them well with water. ? Avoid your genital area. ? Avoid any areas of skin that have broken skin, cuts, or scrapes. ? Scrub your back and under your arms. Make sure to wash skin folds. 6. Let the lather sit on your skin for 1-2 minutes or as long as told by your health care provider. 7. Thoroughly rinse your entire body in the shower. Make sure that all body creases and crevices are rinsed well. 8. Dry off with a clean towel. Do not put any substances on your body afterward--such as powder, lotion, or perfume--unless you are told to do so by your health care provider. Only use lotions that are recommended by the manufacturer. 9. Put on clean clothes or pajamas. 10. If it is the night before your surgery, sleep in clean sheets.  During a sponge bath Follow these steps when using CHG solution during a sponge bath (unless your health care provider gives you different instructions): 1. Use your normal soap and shampoo to wash your face and hair. 2. Pour the CHG onto a clean washcloth. 3. Starting at your neck, lather your body down to your toes. Make sure you follow these instructions: ? If you will be having surgery, pay special attention to the part of your body where you will be having surgery. Scrub this area for at least 1 minute. ? Do not use CHG on your head or face. If the solution gets into your ears or eyes, rinse them well with water. ? Avoid your  genital area. ? Avoid any areas of skin that have broken skin, cuts, or scrapes. ? Scrub your back and under your arms. Make sure to wash skin folds. 4. Let the lather sit on your skin for 1-2 minutes or as long as told by your health care provider. 5. Using a different clean, wet washcloth, thoroughly rinse your entire body. Make sure that all body creases and crevices are rinsed well. 6. Dry off with a clean towel. Do not put any substances on your body afterward--such as powder, lotion, or perfume--unless you are told to do so by your health care provider. Only use lotions that are recommended by the manufacturer. 7. Put on clean clothes or pajamas. 8. If it is the night before your surgery, sleep in clean sheets. How to use CHG prepackaged cloths  Only use CHG cloths as told by your health care provider, and follow the instructions on the label.  Use the CHG cloth on clean, dry skin.  Do not use the CHG cloth on your head or face unless your health care provider tells you to.  When washing with the CHG cloth: ? Avoid your genital area. ? Avoid any areas of skin that have broken skin, cuts, or scrapes. Before surgery Follow these steps when using a CHG cloth to clean before surgery (unless your health care provider gives you different instructions): 1. Using the CHG cloth, vigorously scrub the part of your body where you will be having surgery. Scrub using a back-and-forth motion for 3 minutes. The  area on your body should be completely wet with CHG when you are done scrubbing. 2. Do not rinse. Discard the cloth and let the area air-dry. Do not put any substances on the area afterward, such as powder, lotion, or perfume. 3. Put on clean clothes or pajamas. 4. If it is the night before your surgery, sleep in clean sheets.  For general bathing Follow these steps when using CHG cloths for general bathing (unless your health care provider gives you different instructions). 1. Use a  separate CHG cloth for each area of your body. Make sure you wash between any folds of skin and between your fingers and toes. Wash your body in the following order, switching to a new cloth after each step: ? The front of your neck, shoulders, and chest. ? Both of your arms, under your arms, and your hands. ? Your stomach and groin area, avoiding the genitals. ? Your right leg and foot. ? Your left leg and foot. ? The back of your neck, your back, and your buttocks. 2. Do not rinse. Discard the cloth and let the area air-dry. Do not put any substances on your body afterward--such as powder, lotion, or perfume--unless you are told to do so by your health care provider. Only use lotions that are recommended by the manufacturer. 3. Put on clean clothes or pajamas. Contact a health care provider if:  Your skin gets irritated after scrubbing.  You have questions about using your solution or cloth. Get help right away if:  Your eyes become very red or swollen.  Your eyes itch badly.  Your skin itches badly and is red or swollen.  Your hearing changes.  You have trouble seeing.  You have swelling or tingling in your mouth or throat.  You have trouble breathing.  You swallow any chlorhexidine. Summary  Chlorhexidine gluconate (CHG) is a germ-killing (antiseptic) solution that is used to clean the skin. Cleaning your skin with CHG may help to lower your risk for infection.  You may be given CHG to use for bathing. It may be in a bottle or in a prepackaged cloth to use on your skin. Carefully follow your health care provider's instructions and the instructions on the product label.  Do not use CHG if you have a chlorhexidine allergy.  Contact your health care provider if your skin gets irritated after scrubbing. This information is not intended to replace advice given to you by your health care provider. Make sure you discuss any questions you have with your health care  provider. Document Revised: 07/20/2018 Document Reviewed: 03/31/2017 Elsevier Patient Education  [place].

## 2020-03-25 ENCOUNTER — Encounter (HOSPITAL_COMMUNITY): Payer: Self-pay

## 2020-03-25 ENCOUNTER — Encounter (HOSPITAL_COMMUNITY)
Admission: RE | Admit: 2020-03-25 | Discharge: 2020-03-25 | Disposition: A | Discharge status: Home / Self Care | Payer: Medicare Other | Source: Ambulatory Visit | Attending: General Surgery | Admitting: General Surgery

## 2020-03-25 ENCOUNTER — Other Ambulatory Visit: Payer: Self-pay

## 2020-03-26 ENCOUNTER — Other Ambulatory Visit (HOSPITAL_COMMUNITY)
Admission: RE | Admit: 2020-03-26 | Discharge: 2020-03-26 | Disposition: A | Discharge status: Home / Self Care | Payer: Medicare Other | Source: Ambulatory Visit | Attending: General Surgery | Admitting: General Surgery

## 2020-03-26 ENCOUNTER — Other Ambulatory Visit: Payer: Self-pay

## 2020-03-26 DIAGNOSIS — Z01818 Encounter for other preprocedural examination: Secondary | ICD-10-CM | POA: Diagnosis not present

## 2020-03-26 DIAGNOSIS — Z20822 Contact with and (suspected) exposure to covid-19: Secondary | ICD-10-CM | POA: Diagnosis not present

## 2020-03-27 LAB — SARS CORONAVIRUS 2 (TAT 6-24 HRS): SARS Coronavirus 2: NEGATIVE

## 2020-03-28 ENCOUNTER — Other Ambulatory Visit: Payer: Self-pay

## 2020-03-28 ENCOUNTER — Encounter (HOSPITAL_COMMUNITY): Payer: Self-pay | Admitting: General Surgery

## 2020-03-28 ENCOUNTER — Ambulatory Visit (HOSPITAL_COMMUNITY): Payer: Medicare Other | Admitting: Anesthesiology

## 2020-03-28 ENCOUNTER — Ambulatory Visit (HOSPITAL_COMMUNITY)
Admission: RE | Admit: 2020-03-28 | Discharge: 2020-03-28 | Disposition: A | Discharge status: Home / Self Care | Payer: Medicare Other | Attending: General Surgery | Admitting: General Surgery

## 2020-03-28 ENCOUNTER — Encounter (HOSPITAL_COMMUNITY)
Admission: RE | Disposition: A | Discharge status: Home / Self Care | Payer: Self-pay | Source: Home / Self Care | Attending: General Surgery

## 2020-03-28 DIAGNOSIS — Z87891 Personal history of nicotine dependence: Secondary | ICD-10-CM | POA: Insufficient documentation

## 2020-03-28 DIAGNOSIS — K219 Gastro-esophageal reflux disease without esophagitis: Secondary | ICD-10-CM | POA: Diagnosis not present

## 2020-03-28 DIAGNOSIS — E7800 Pure hypercholesterolemia, unspecified: Secondary | ICD-10-CM | POA: Diagnosis not present

## 2020-03-28 DIAGNOSIS — Z9049 Acquired absence of other specified parts of digestive tract: Secondary | ICD-10-CM | POA: Insufficient documentation

## 2020-03-28 DIAGNOSIS — Z888 Allergy status to other drugs, medicaments and biological substances status: Secondary | ICD-10-CM | POA: Insufficient documentation

## 2020-03-28 DIAGNOSIS — K432 Incisional hernia without obstruction or gangrene: Secondary | ICD-10-CM | POA: Diagnosis not present

## 2020-03-28 DIAGNOSIS — I10 Essential (primary) hypertension: Secondary | ICD-10-CM | POA: Diagnosis not present

## 2020-03-28 DIAGNOSIS — Z825 Family history of asthma and other chronic lower respiratory diseases: Secondary | ICD-10-CM | POA: Insufficient documentation

## 2020-03-28 DIAGNOSIS — K4090 Unilateral inguinal hernia, without obstruction or gangrene, not specified as recurrent: Secondary | ICD-10-CM

## 2020-03-28 DIAGNOSIS — Z808 Family history of malignant neoplasm of other organs or systems: Secondary | ICD-10-CM | POA: Diagnosis not present

## 2020-03-28 DIAGNOSIS — I739 Peripheral vascular disease, unspecified: Secondary | ICD-10-CM | POA: Diagnosis not present

## 2020-03-28 DIAGNOSIS — Z8249 Family history of ischemic heart disease and other diseases of the circulatory system: Secondary | ICD-10-CM | POA: Insufficient documentation

## 2020-03-28 DIAGNOSIS — Z79899 Other long term (current) drug therapy: Secondary | ICD-10-CM | POA: Insufficient documentation

## 2020-03-28 DIAGNOSIS — I2510 Atherosclerotic heart disease of native coronary artery without angina pectoris: Secondary | ICD-10-CM | POA: Diagnosis not present

## 2020-03-28 DIAGNOSIS — J449 Chronic obstructive pulmonary disease, unspecified: Secondary | ICD-10-CM | POA: Insufficient documentation

## 2020-03-28 DIAGNOSIS — Z880 Allergy status to penicillin: Secondary | ICD-10-CM | POA: Insufficient documentation

## 2020-03-28 DIAGNOSIS — I714 Abdominal aortic aneurysm, without rupture: Secondary | ICD-10-CM | POA: Diagnosis not present

## 2020-03-28 DIAGNOSIS — Z881 Allergy status to other antibiotic agents status: Secondary | ICD-10-CM | POA: Diagnosis not present

## 2020-03-28 DIAGNOSIS — I252 Old myocardial infarction: Secondary | ICD-10-CM | POA: Diagnosis not present

## 2020-03-28 DIAGNOSIS — Z7951 Long term (current) use of inhaled steroids: Secondary | ICD-10-CM | POA: Diagnosis not present

## 2020-03-28 DIAGNOSIS — D176 Benign lipomatous neoplasm of spermatic cord: Secondary | ICD-10-CM | POA: Diagnosis not present

## 2020-03-28 HISTORY — PX: INGUINAL HERNIA REPAIR: SHX194

## 2020-03-28 SURGERY — REPAIR, HERNIA, INGUINAL, ADULT
Anesthesia: General | Site: Groin | Laterality: Right

## 2020-03-28 MED ORDER — PHENYLEPHRINE 40 MCG/ML (10ML) SYRINGE FOR IV PUSH (FOR BLOOD PRESSURE SUPPORT)
PREFILLED_SYRINGE | INTRAVENOUS | Status: AC
  Filled 2020-03-28: qty 10

## 2020-03-28 MED ORDER — KETOROLAC TROMETHAMINE 30 MG/ML IJ SOLN
15.0000 mg | Freq: Once | INTRAMUSCULAR | Status: AC
  Administered 2020-03-28: 15 mg via INTRAVENOUS
  Filled 2020-03-28: qty 1

## 2020-03-28 MED ORDER — ONDANSETRON HCL 4 MG/2ML IJ SOLN: 4.0000 mg | Freq: Once | INTRAMUSCULAR | Status: DC | PRN

## 2020-03-28 MED ORDER — VANCOMYCIN HCL IN DEXTROSE 1-5 GM/200ML-% IV SOLN
INTRAVENOUS | Status: AC
  Filled 2020-03-28: qty 200

## 2020-03-28 MED ORDER — CEFAZOLIN SODIUM-DEXTROSE 2-4 GM/100ML-% IV SOLN
INTRAVENOUS | Status: AC
  Filled 2020-03-28: qty 100

## 2020-03-28 MED ORDER — FENTANYL CITRATE (PF) 100 MCG/2ML IJ SOLN
INTRAMUSCULAR | Status: AC
  Filled 2020-03-28: qty 2

## 2020-03-28 MED ORDER — SUCCINYLCHOLINE CHLORIDE 20 MG/ML IJ SOLN
INTRAMUSCULAR | Status: DC | PRN
  Administered 2020-03-28: 180 mg via INTRAVENOUS

## 2020-03-28 MED ORDER — CHLORHEXIDINE GLUCONATE 0.12 % MT SOLN
15.0000 mL | Freq: Once | OROMUCOSAL | Status: AC
  Administered 2020-03-28: 15 mL via OROMUCOSAL

## 2020-03-28 MED ORDER — BUPIVACAINE LIPOSOME 1.3 % IJ SUSP
INTRAMUSCULAR | Status: DC | PRN
  Administered 2020-03-28: 20 mL

## 2020-03-28 MED ORDER — VANCOMYCIN HCL IN DEXTROSE 1-5 GM/200ML-% IV SOLN
1000.0000 mg | INTRAVENOUS | Status: AC
  Administered 2020-03-28: 1000 mg via INTRAVENOUS

## 2020-03-28 MED ORDER — BUPIVACAINE LIPOSOME 1.3 % IJ SUSP
INTRAMUSCULAR | Status: AC
  Filled 2020-03-28: qty 20

## 2020-03-28 MED ORDER — SODIUM CHLORIDE 0.9 % IR SOLN
Status: DC | PRN
  Administered 2020-03-28: 1000 mL

## 2020-03-28 MED ORDER — LIDOCAINE 2% (20 MG/ML) 5 ML SYRINGE
INTRAMUSCULAR | Status: AC
  Filled 2020-03-28: qty 5

## 2020-03-28 MED ORDER — ONDANSETRON HCL 4 MG/2ML IJ SOLN
INTRAMUSCULAR | Status: DC | PRN
  Administered 2020-03-28: 4 mg via INTRAVENOUS

## 2020-03-28 MED ORDER — CHLORHEXIDINE GLUCONATE CLOTH 2 % EX PADS
6.0000 | MEDICATED_PAD | Freq: Once | CUTANEOUS | Status: AC
  Administered 2020-03-28: 6 via TOPICAL

## 2020-03-28 MED ORDER — EPHEDRINE SULFATE 50 MG/ML IJ SOLN
INTRAMUSCULAR | Status: DC | PRN
  Administered 2020-03-28: 10 mg via INTRAVENOUS

## 2020-03-28 MED ORDER — LIDOCAINE HCL (CARDIAC) PF 50 MG/5ML IV SOSY
PREFILLED_SYRINGE | INTRAVENOUS | Status: DC | PRN
  Administered 2020-03-28: 100 mg via INTRAVENOUS

## 2020-03-28 MED ORDER — PROPOFOL 10 MG/ML IV BOLUS
INTRAVENOUS | Status: AC
  Filled 2020-03-28: qty 20

## 2020-03-28 MED ORDER — SUCCINYLCHOLINE CHLORIDE 200 MG/10ML IV SOSY
PREFILLED_SYRINGE | INTRAVENOUS | Status: AC
  Filled 2020-03-28: qty 10

## 2020-03-28 MED ORDER — LACTATED RINGERS IV SOLN: Freq: Once | INTRAVENOUS | Status: AC

## 2020-03-28 MED ORDER — HYDROCODONE-ACETAMINOPHEN 5-325 MG PO TABS: 1.0000 | ORAL_TABLET | Freq: Four times a day (QID) | ORAL | 0 refills | Status: DC | PRN

## 2020-03-28 MED ORDER — ONDANSETRON HCL 4 MG/2ML IJ SOLN
INTRAMUSCULAR | Status: AC
  Filled 2020-03-28: qty 2

## 2020-03-28 MED ORDER — EPHEDRINE 5 MG/ML INJ
INTRAVENOUS | Status: AC
  Filled 2020-03-28: qty 10

## 2020-03-28 MED ORDER — HYDROMORPHONE HCL 1 MG/ML IJ SOLN
0.2500 mg | INTRAMUSCULAR | Status: DC | PRN
  Administered 2020-03-28 (×2): 0.5 mg via INTRAVENOUS
  Filled 2020-03-28 (×2): qty 0.5

## 2020-03-28 MED ORDER — PROPOFOL 10 MG/ML IV BOLUS
INTRAVENOUS | Status: DC | PRN
  Administered 2020-03-28: 130 mg via INTRAVENOUS

## 2020-03-28 MED ORDER — ROCURONIUM BROMIDE 100 MG/10ML IV SOLN
INTRAVENOUS | Status: DC | PRN
  Administered 2020-03-28: 30 mg via INTRAVENOUS
  Administered 2020-03-28: 10 mg via INTRAVENOUS

## 2020-03-28 MED ORDER — PHENYLEPHRINE HCL (PRESSORS) 10 MG/ML IV SOLN
INTRAVENOUS | Status: DC | PRN
  Administered 2020-03-28 (×2): 100 ug via INTRAVENOUS
  Administered 2020-03-28: 80 ug via INTRAVENOUS
  Administered 2020-03-28: 100 ug via INTRAVENOUS
  Administered 2020-03-28: 80 ug via INTRAVENOUS
  Administered 2020-03-28 (×3): 100 ug via INTRAVENOUS

## 2020-03-28 MED ORDER — ORAL CARE MOUTH RINSE: 15.0000 mL | Freq: Once | OROMUCOSAL | Status: AC

## 2020-03-28 MED ORDER — SUGAMMADEX SODIUM 500 MG/5ML IV SOLN
INTRAVENOUS | Status: DC | PRN
  Administered 2020-03-28: 200 mg via INTRAVENOUS

## 2020-03-28 MED ORDER — LACTATED RINGERS IV SOLN: INTRAVENOUS | Status: DC | PRN

## 2020-03-28 MED ORDER — FENTANYL CITRATE (PF) 100 MCG/2ML IJ SOLN
INTRAMUSCULAR | Status: DC | PRN
  Administered 2020-03-28: 100 ug via INTRAVENOUS

## 2020-03-28 SURGICAL SUPPLY — 38 items
CLOTH BEACON ORANGE TIMEOUT ST (SAFETY) ×2 IMPLANT
COVER LIGHT HANDLE STERIS (MISCELLANEOUS) ×4 IMPLANT
COVER WAND RF STERILE (DRAPES) ×2 IMPLANT
DERMABOND ADVANCED (GAUZE/BANDAGES/DRESSINGS) ×1
DERMABOND ADVANCED .7 DNX12 (GAUZE/BANDAGES/DRESSINGS) ×1 IMPLANT
DRAIN PENROSE 0.5X18 (DRAIN) ×2 IMPLANT
ELECT REM PT RETURN 9FT ADLT (ELECTROSURGICAL) ×2
ELECTRODE REM PT RTRN 9FT ADLT (ELECTROSURGICAL) ×1 IMPLANT
GAUZE SPONGE 4X4 12PLY STRL (GAUZE/BANDAGES/DRESSINGS) ×2 IMPLANT
GLOVE BIOGEL PI IND STRL 7.0 (GLOVE) ×3 IMPLANT
GLOVE BIOGEL PI INDICATOR 7.0 (GLOVE) ×3
GLOVE SURG SS PI 7.5 STRL IVOR (GLOVE) ×4 IMPLANT
GOWN STRL REUS W/TWL LRG LVL3 (GOWN DISPOSABLE) ×6 IMPLANT
INST SET MINOR GENERAL (KITS) ×2 IMPLANT
KIT TURNOVER KIT A (KITS) ×2 IMPLANT
MANIFOLD NEPTUNE II (INSTRUMENTS) ×2 IMPLANT
MESH HERNIA 1.6X1.9 PLUG LRG (Mesh General) ×1 IMPLANT
MESH HERNIA PLUG LRG (Mesh General) ×1 IMPLANT
MESH MARLEX PLUG MEDIUM (Mesh General) ×2 IMPLANT
NEEDLE HYPO 18GX1.5 BLUNT FILL (NEEDLE) ×2 IMPLANT
NEEDLE HYPO 21X1.5 SAFETY (NEEDLE) ×2 IMPLANT
NS IRRIG 1000ML POUR BTL (IV SOLUTION) ×2 IMPLANT
PACK MINOR (CUSTOM PROCEDURE TRAY) ×2 IMPLANT
PAD ARMBOARD 7.5X6 YLW CONV (MISCELLANEOUS) ×2 IMPLANT
PENCIL SMOKE EVACUATOR (MISCELLANEOUS) ×2 IMPLANT
SET BASIN LINEN APH (SET/KITS/TRAYS/PACK) ×2 IMPLANT
SOL PREP PROV IODINE SCRUB 4OZ (MISCELLANEOUS) ×2 IMPLANT
SUT MNCRL AB 4-0 PS2 18 (SUTURE) ×2 IMPLANT
SUT NOVA NAB GS-22 2 2-0 T-19 (SUTURE) ×6 IMPLANT
SUT SILK 3 0 (SUTURE)
SUT SILK 3-0 18XBRD TIE 12 (SUTURE) IMPLANT
SUT VIC AB 2-0 CT1 27 (SUTURE) ×2
SUT VIC AB 2-0 CT1 TAPERPNT 27 (SUTURE) ×1 IMPLANT
SUT VIC AB 3-0 SH 27 (SUTURE) ×2
SUT VIC AB 3-0 SH 27X BRD (SUTURE) ×1 IMPLANT
SUT VICRYL AB 2 0 TIES (SUTURE) IMPLANT
SUT VICRYL AB 3 0 TIES (SUTURE) IMPLANT
SYR 20ML LL LF (SYRINGE) ×4 IMPLANT

## 2020-03-28 NOTE — Anesthesia Postprocedure Evaluation (Signed)
Anesthesia Post Note  Patient: [name] [name]  Procedure(s) Performed: Right Inguinal Herniorrhaphy with Mesh (Right Groin)  Patient location during evaluation: PACU Anesthesia Type: General Level of consciousness: awake, oriented, awake and alert and patient cooperative Pain management: pain level controlled Vital Signs Assessment: post-procedure vital signs reviewed and stable Respiratory status: spontaneous breathing, respiratory function stable and nonlabored ventilation Cardiovascular status: blood pressure returned to baseline and stable Postop Assessment: no headache and no backache Anesthetic complications: no   No complications documented.   Last Vitals:  Vitals:   03/28/20 0912  BP: (!) 141/89  Pulse: 78  Resp: 16  Temp: 37.1 C  SpO2: 98%    Last Pain:  Vitals:   03/28/20 0912  TempSrc: Oral  PainSc: 0-No pain                 Tacy Learn

## 2020-03-28 NOTE — Interval H&P Note (Signed)
History and Physical Interval Note:  03/28/2020 9:13 AM  [name] [name]  has presented today for surgery, with the diagnosis of Right inguinal hernia.  The various methods of treatment have been discussed with the patient and family. After consideration of risks, benefits and other options for treatment, the patient has consented to  Procedure(s): HERNIA REPAIR INGUINAL ADULT (Right) as a surgical intervention.  The patient's history has been reviewed, patient examined, no change in status, stable for surgery.  I have reviewed the patient's chart and labs.  Questions were answered to the patient's satisfaction.     Aviva Signs

## 2020-03-28 NOTE — Op Note (Signed)
Patient:  [name] [name]  DOB:  October 15, 1940  MRN:  [id number]   Preop Diagnosis: Right inguinal hernia  Postop Diagnosis: Same  Procedure: Right inguinal herniorrhaphy with mesh  Surgeon: Aviva Signs, MD  Anes: General  Indications: Patient is a 79 year old white male who presents with a symptomatic right inguinal hernia.  The risks and benefits of the procedure including bleeding, infection, mesh use, and the possibly recurrence of the hernia were fully explained to the patient, who gave informed consent.  Procedure note: The patient was placed in the supine position.  After general anesthesia was administered, the right groin region was prepped and draped using the usual sterile technique with Betadine.  Surgical site confirmation was performed.  An incision was made in the right groin region down to the external oblique aponeurosis.  The aponeurosis was incised to the external ring.  A Penrose drain was placed around the spermatic cord.  The vas deferens was noted within the spermatic cord.  The ilioinguinal nerve was identified and retracted inferiorly from the operative field.  The patient had a small lipoma of the cord and this was excised.  The patient had a large indirect hernia that contained primarily adipose tissue.  No bowel was seen.  It was freed away from the spermatic cord up to the peritoneal reflection and inverted.  Both a medium and large Bard PerFix plug was inserted into this region.  An onlay patch was then placed along the floor of the inguinal canal and secured superiorly to the conjoined tendon and inferiorly to the shelving edge of Poupart's ligament using 2-0 Novafil interrupted sutures.  The internal ring was recreated using a 2-0 Novafil interrupted suture.  The external oblique aponeurosis was reapproximated using a 2-0 Vicryl running suture.  The subcutaneous layer was reapproximated using a 3-0 Vicryl interrupted suture.  Exparel was instilled into the surrounding  wound.  The skin was closed using a 4-0 Monocryl subcuticular suture.  Dermabond was applied.  All tape and needle counts were correct at the end of the procedure.  The patient was awakened and transferred to PACU in stable condition.  Complications: None  EBL: Minimal  Specimen: None

## 2020-03-28 NOTE — Anesthesia Procedure Notes (Signed)
Procedure Name: Intubation Performed by: Tacy Learn, CRNA Pre-anesthesia Checklist: Patient identified, Emergency Drugs available, Suction available, Patient being monitored and Timeout performed Patient Re-evaluated:Patient Re-evaluated prior to induction Oxygen Delivery Method: Circle system utilized Preoxygenation: Pre-oxygenation with 100% oxygen Induction Type: IV induction Laryngoscope Size: Miller and 3 Grade View: Grade II Tube size: 7.5 mm Number of attempts: 1 Airway Equipment and Method: Stylet Placement Confirmation: ETT inserted through vocal cords under direct vision,  positive ETCO2,  CO2 detector and breath sounds checked- equal and bilateral Secured at: 23 cm Tube secured with: Tape Dental Injury: Teeth and Oropharynx as per pre-operative assessment

## 2020-03-28 NOTE — Anesthesia Preprocedure Evaluation (Signed)
Anesthesia Evaluation  Patient identified by MRN, date of birth, ID band Patient awake    Reviewed: Allergy & Precautions, NPO status , Patient's Chart, lab work & pertinent test results  History of Anesthesia Complications (+) history of anesthetic complications (ulnar nerve compression)  Airway Mallampati: III  TM Distance: >3 FB Neck ROM: Full    Dental  (+) Teeth Intact, Dental Advisory Given   Pulmonary asthma , COPD, former smoker,    Pulmonary exam normal breath sounds clear to auscultation       Cardiovascular Exercise Tolerance: Good hypertension, Pt. on medications + CAD, + Past MI (1999) and + Peripheral Vascular Disease (AAA 4.1cm)  Normal cardiovascular exam+ dysrhythmias  Rhythm:Regular Rate:Normal  Summary:  Abdominal Aorta: There is evidence of abnormal dilatation of the distal Abdominal aorta. The largest aortic measurement is 4.4 cm. The largest aortic diameter remains essentially unchanged compared to prior exam.  Previous diameter measurement was 4.1 cm  obtained on 02/1019.   28-Dec-2019 15:00:35 [facility] ROUTINE RECORD Normal sinus rhythm Septal infarct , age undetermined Abnormal ECG No STEMI Confirmed by Octaviano Glow [phone]) on 12/28/2019 3:29:35 PM   Neuro/Psych PSYCHIATRIC DISORDERS Depression  Neuromuscular disease    GI/Hepatic Neg liver ROS, GERD  Medicated,  Endo/Other  negative endocrine ROS  Renal/GU negative Renal ROS  negative genitourinary   Musculoskeletal  (+) Arthritis ,   Abdominal   Peds  Hematology  (+) anemia ,   Anesthesia Other Findings   Reproductive/Obstetrics                            Anesthesia Physical Anesthesia Plan  ASA: III  Anesthesia Plan: General   Post-op Pain Management:    Induction: Intravenous  PONV Risk Score and Plan: Ondansetron  Airway Management Planned: Oral ETT  Additional  Equipment:   Intra-op Plan:   Post-operative Plan: Extubation in OR  Informed Consent: I have reviewed the patients History and Physical, chart, labs and discussed the procedure including the risks, benefits and alternatives for the proposed anesthesia with the patient or authorized representative who has indicated his/her understanding and acceptance.     Dental advisory given  Plan Discussed with: CRNA and Surgeon  Anesthesia Plan Comments:        Anesthesia Quick Evaluation

## 2020-03-28 NOTE — Transfer of Care (Signed)
Immediate Anesthesia Transfer of Care Note  Patient: [name] [name]  Procedure(s) Performed: Right Inguinal Herniorrhaphy with Mesh (Right Groin)  Patient Location: PACU  Anesthesia Type:General  Level of Consciousness: awake, alert , oriented and patient cooperative  Airway & Oxygen Therapy: Patient Spontanous Breathing  Post-op Assessment: Report given to RN, Post -op Vital signs reviewed and stable and Patient moving all extremities  Post vital signs: Reviewed and stable  Last Vitals:  Vitals Value Taken Time  BP 129/82 03/28/20 1048  Temp    Pulse 73 03/28/20 1052  Resp 16 03/28/20 1052  SpO2 100 % 03/28/20 1052  Vitals shown include unvalidated device data.  Last Pain:  Vitals:   03/28/20 0912  TempSrc: Oral  PainSc: 0-No pain         Complications: No complications documented.

## 2020-03-28 NOTE — Discharge Instructions (Signed)
[facility] THE [facility] UNTIL Tuesday April 01, 2020. DO NOT USE ADDITIONAL NUMBING MEDICATIONS UNTIL AFTER Tuesday WITHOUT CONSULTING A PHYSICIAN        Open Hernia Repair, Adult, Care After This sheet gives you information about how to care for yourself after your procedure. Your health care provider may also give you more specific instructions. If you have problems or questions, contact your health care provider. What can I expect after the procedure? After the procedure, it is common to have:  Mild discomfort.  Slight bruising.  Minor swelling.  Pain in the abdomen. Follow these instructions at home: Incision care   Follow instructions from your health care provider about how to take care of your incision area. Make sure you: ? Wash your hands with soap and water before you change your bandage (dressing). If soap and water are not available, use hand sanitizer. ? Change your dressing as told by your health care provider. ? Leave stitches (sutures), skin glue, or adhesive strips in place. These skin closures may need to stay in place for 2 weeks or longer. If adhesive strip edges start to loosen and curl up, you may trim the loose edges. Do not remove adhesive strips completely unless your health care provider tells you to do that.  Check your incision area every day for signs of infection. Check for: ? More redness, swelling, or pain. ? More fluid or blood. ? Warmth. ? Pus or a bad smell. Activity  Do not drive or use heavy machinery while taking prescription pain medicine. Do not drive until your health care provider approves.  Until your health care provider approves: ? Do not lift anything that is heavier than 10 lb (4.5 kg). ? Do not play contact sports.  Return to your normal activities as told by your health care provider. Ask your health care provider what activities are safe. General instructions  To prevent or treat constipation while  you are taking prescription pain medicine, your health care provider may recommend that you: ? Drink enough fluid to keep your urine clear or pale yellow. ? Take over-the-counter or prescription medicines. ? Eat foods that are high in fiber, such as fresh fruits and vegetables, whole grains, and beans. ? Limit foods that are high in fat and processed sugars, such as fried and sweet foods.  Take over-the-counter and prescription medicines only as told by your health care provider.  Do not take tub baths or go swimming until your health care provider approves.  Keep all follow-up visits as told by your health care provider. This is important. Contact a health care provider if:  You develop a rash.  You have more redness, swelling, or pain around your incision.  You have more fluid or blood coming from your incision.  Your incision feels warm to the touch.  You have pus or a bad smell coming from your incision.  You have a fever or chills.  You have blood in your stool (feces).  You have not had a bowel movement in 2-3 days.  Your pain is not controlled with medicine. Get help right away if:  You have chest pain or shortness of breath.  You feel light-headed or feel faint.  You have severe pain.  You vomit and your pain is worse. This information is not intended to replace advice given to you by your health care provider. Make sure you discuss any questions you have with your health care provider. Document Revised: 04/15/2017  Document Reviewed: 10/15/2015 Elsevier Patient Education  [place] Anesthesia, Adult, Care After This sheet gives you information about how to care for yourself after your procedure. Your health care provider may also give you more specific instructions. If you have problems or questions, contact your health care provider. What can I expect after the procedure? After the procedure, the following side effects are  common:  Pain or discomfort at the IV site.  Nausea.  Vomiting.  Sore throat.  Trouble concentrating.  Feeling cold or chills.  Weak or tired.  Sleepiness and fatigue.  Soreness and body aches. These side effects can affect parts of the body that were not involved in surgery. Follow these instructions at home:  For at least 24 hours after the procedure:  Have a responsible adult stay with you. It is important to have someone help care for you until you are awake and alert.  Rest as needed.  Do not: ? Participate in activities in which you could fall or become injured. ? Drive. ? Use heavy machinery. ? Drink alcohol. ? Take sleeping pills or medicines that cause drowsiness. ? Make important decisions or sign legal documents. ? Take care of children on your own. Eating and drinking  Follow any instructions from your health care provider about eating or drinking restrictions.  When you feel hungry, start by eating small amounts of foods that are soft and easy to digest (bland), such as toast. Gradually return to your regular diet.  Drink enough fluid to keep your urine pale yellow.  If you vomit, rehydrate by drinking water, juice, or clear broth. General instructions  If you have sleep apnea, surgery and certain medicines can increase your risk for breathing problems. Follow instructions from your health care provider about wearing your sleep device: ? Anytime you are sleeping, including during daytime naps. ? While taking prescription pain medicines, sleeping medicines, or medicines that make you drowsy.  Return to your normal activities as told by your health care provider. Ask your health care provider what activities are safe for you.  Take over-the-counter and prescription medicines only as told by your health care provider.  If you smoke, do not smoke without supervision.  Keep all follow-up visits as told by your health care provider. This is  important. Contact a health care provider if:  You have nausea or vomiting that does not get better with medicine.  You cannot eat or drink without vomiting.  You have pain that does not get better with medicine.  You are unable to pass urine.  You develop a skin rash.  You have a fever.  You have redness around your IV site that gets worse. Get help right away if:  You have difficulty breathing.  You have chest pain.  You have blood in your urine or stool, or you vomit blood. Summary  After the procedure, it is common to have a sore throat or nausea. It is also common to feel tired.  Have a responsible adult stay with you for the first 24 hours after general anesthesia. It is important to have someone help care for you until you are awake and alert.  When you feel hungry, start by eating small amounts of foods that are soft and easy to digest (bland), such as toast. Gradually return to your regular diet.  Drink enough fluid to keep your urine pale yellow.  Return to your normal activities as told by your health care provider.  Ask your health care provider what activities are safe for you. This information is not intended to replace advice given to you by your health care provider. Make sure you discuss any questions you have with your health care provider. Document Revised: 05/06/2017 Document Reviewed: 12/17/2016 Elsevier Patient Education  [place].

## 2020-03-31 ENCOUNTER — Encounter (HOSPITAL_COMMUNITY): Payer: Self-pay | Admitting: General Surgery

## 2020-04-03 ENCOUNTER — Other Ambulatory Visit: Payer: Self-pay

## 2020-04-03 ENCOUNTER — Encounter: Payer: Self-pay | Admitting: General Surgery

## 2020-04-03 ENCOUNTER — Ambulatory Visit (INDEPENDENT_AMBULATORY_CARE_PROVIDER_SITE_OTHER): Payer: Medicare Other | Admitting: General Surgery

## 2020-04-03 VITALS — BP 146/79 | HR 72 | Temp 98.1°F | Resp 16 | Ht 73.0 in | Wt 200.0 lb

## 2020-04-03 DIAGNOSIS — Z09 Encounter for follow-up examination after completed treatment for conditions other than malignant neoplasm: Secondary | ICD-10-CM

## 2020-04-03 NOTE — Progress Notes (Signed)
Subjective:     [name] [name]  Here for postoperative visit, status post right inguinal herniorrhaphy with mesh.  Patient has bruising which is resolving.  He tried hydrocodone but started having some pruritus.  Otherwise, he is walking around without difficulty. Objective:    BP (!) 146/79   Pulse 72   Temp 98.1 F (36.7 C) (Oral)   Resp 16   Ht 6\' 1"  (1.854 m)   Wt 200 lb (90.7 kg)   SpO2 94%   BMI 26.39 kg/m   General:  alert, cooperative and no distress  Abdomen soft, incision healing well.  Resolving ecchymosis present.  No seroma present.     Assessment:    Doing well postoperatively.    Plan:   Increase activity as able.  Follow-up as needed.  He was instructed to call the office if he has any issues.

## 2020-04-07 ENCOUNTER — Other Ambulatory Visit: Payer: Self-pay | Admitting: Family Medicine

## 2020-04-09 NOTE — Telephone Encounter (Signed)
Needs f/u appt in next 20-30 days for f/u on anxiety/insomnia. Thx, dr. Lovena Le

## 2020-04-14 DIAGNOSIS — Z23 Encounter for immunization: Secondary | ICD-10-CM | POA: Diagnosis not present

## 2020-04-16 ENCOUNTER — Other Ambulatory Visit: Payer: Self-pay | Admitting: Family Medicine

## 2020-04-22 ENCOUNTER — Other Ambulatory Visit: Payer: Self-pay | Admitting: Family Medicine

## 2020-04-22 DIAGNOSIS — R040 Epistaxis: Secondary | ICD-10-CM | POA: Diagnosis not present

## 2020-04-28 NOTE — Progress Notes (Signed)
Patient ID: [name] [name], male   DOB: 21-Jul-1940, 79 y.o.   MRN: [id number]     79 y.o. IMI with PCI 1999 Normal myovue in June 2016 with no ischemia Due for f/u US for AAA measured 4.1 cm on [place] 02/14/19   He visits his daughter in [place] regularly Also has had some palpitations and monitor showing PAC;s short bursts PSVT  Indicates intolerance to beta blockers toprol and bystolic .  Interestingly diarrhea occurred with both Has had issues with coreg, atenolol, cardizem and ranexa in past. Some nasal Congestion with ARB in past as well   Wife passed a few  years ago of Pancreatic Cancer and only lasted 6 weeks form diagnosis   No cardiac complaints needs new nitro   02/11/20 had recurrent right posterior epistaxis requiring endoscopic control and cauterization  Had right hernia surgery with Dr Arnoldo Morale last month 03/28/20  Still with some pain/swelling  Had idiopathic pancreatitis recently Not a drinker has had GB removed Seen by Dr Paulita Fujita no etiology noted  Lost his wife 3 years ago due to pancreatic cancer. Has one handicapped son in town and a daughter One grandson in his 20's that works for [employer].   ROS: Denies fever, malais, weight loss, blurry vision, decreased visual acuity, cough, sputum, SOB, hemoptysis, pleuritic pain, palpitaitons, heartburn, abdominal pain, melena, lower extremity edema, claudication, or rash.  All other systems reviewed and negative  General: BP 114/68   Pulse 82   Resp 16   Ht 6\' 1"  (1.854 m)   Wt 89.9 kg   SpO2 97%   BMI 26.15 kg/m   Affect appropriate Healthy:  appears stated age 36: hard of hearing Aides in place  Neck supple with no adenopathy JVP normal no bruits no thyromegaly Lungs Mild exp wheezing and good diaphragmatic motion Heart:  S1/S2 no murmur, no rub, gallop or click PMI normal Abdomen: benighn, BS positve, no tenderness, AAA palpable not tender  no bruit.  No HSM or HJR Post right hernia surgery repair  Distal pulses intact  with no bruits No edema Neuro non-focal Skin warm and dry No muscular weakness   Current Outpatient Medications  Medication Sig Dispense Refill  . albuterol (PROVENTIL) (2.5 MG/3ML) 0.083% nebulizer solution INHALE 1 VIAL VIA NEBULIZER FOUR TIMES DAILY (Patient taking differently: Take 2.5 mg by nebulization every 6 (six) hours as needed for wheezing or shortness of breath.) 375 mL 6  . albuterol (VENTOLIN HFA) 108 (90 Base) MCG/ACT inhaler 2 puffs as needed    . alfuzosin (UROXATRAL) 10 MG 24 hr tablet Take 1 tablet (10 mg total) by mouth daily with breakfast. 90 tablet 3  . cetirizine (ZYRTEC) 10 MG tablet Take 10 mg by mouth daily.    . dorzolamide-timolol (COSOPT) 22.3-6.8 MG/ML ophthalmic solution Place 1 drop into both eyes 2 (two) times daily.     Marland Kitchen esomeprazole (NEXIUM) 20 MG capsule Take 20 mg by mouth daily at 12 noon.    Marland Kitchen esomeprazole (NEXIUM) 40 MG capsule 2 capsules    . fluticasone (FLOVENT HFA) 220 MCG/ACT inhaler Inhale 2 puffs into the lungs daily.     . hydroxypropyl methylcellulose / hypromellose (ISOPTO TEARS / GONIOVISC) 2.5 % ophthalmic solution Place 1 drop into both eyes 3 (three) times daily as needed for dry eyes.    Marland Kitchen LORazepam (ATIVAN) 0.5 MG tablet TAKE 1 TABLET BY MOUTH ONCE DAILY AT BEDTIME. 30 tablet 0  . losartan (COZAAR) 25 MG tablet TAKE ONE TABLET BY  MOUTH ONCE DAILY. 90 tablet 0  . lovastatin (MEVACOR) 40 MG tablet TAKE 1 TABLET BY MOUTH DAILY. 30 tablet 2  . nitroGLYCERIN (NITROSTAT) 0.4 MG SL tablet Place 1 tablet (0.4 mg total) under the tongue every 5 (five) minutes as needed for chest pain. 25 tablet 5  . PROAIR HFA 108 (90 Base) MCG/ACT inhaler INHALE 2 PUFFS INTO THE LUNGS EVERY SIX HOURS AS NEEDED FOR WHEEZING. (Patient taking differently: Inhale 2 puffs into the lungs every 6 (six) hours as needed for wheezing.) 8.5 g 2  . pseudoephedrine-guaifenesin (MUCINEX D) 60-600 MG 12 hr tablet Take 1 tablet by mouth 2 (two) times daily as needed for  congestion.    . sodium chloride (OCEAN) 0.65 % SOLN nasal spray Place 1 spray into both nostrils as needed for congestion.     No current facility-administered medications for this visit.    Allergies  Beta adrenergic blockers, Lasix [furosemide], Cefzil [cefprozil], Dexamethasone, Gabapentin, Neomycin, Tetracyclines & related, Ciprofloxacin, Methocarbamol, and Penicillins  Electrocardiogram:  04/07/18  SR PAC;s otherwise normal   Assessment and Plan  CAD:  Distant PCI RCA 1999 with normal myovue 2009  conitnue medical Rx intolerant to beta blocker  Low risk myovue 08/31/16 with mild mid to apical inferior defect partially reversible EF 56% continue medical Rx Has SL nitro at home   AAA: 4.1 cm by [place] 02/14/19 had MRI/CT abdomen when he had pancreatitis. MRI 11/13/19 indicated 4.2 cm but CT done 03/18/20 indicated "stable" 4.9 cm ?? Will update [place] Review of CT appears to be oblique measurement   HTN:  Well controlled.  Continue current medications and low sodium Dash type diet.    GERD:  Continue carafate and nexium f/u GI  Asthma:  On inhalers no active wheezing   PAC/SVT:  Stable unable to take beta blockers  Anxiety/Depression:   Has not started on SSRI   ENT:  ARB held hearing worse using Aides   HLD:  On mevacor 40 mg daily LDL 67 at goal   Hernia:  F/u Arnoldo Morale seems healed but still with pain/swelling    F/U with me 6 months   Abdominal US for AAA ordered   Jenkins Rouge

## 2020-04-30 ENCOUNTER — Encounter: Payer: Self-pay | Admitting: Urology

## 2020-04-30 ENCOUNTER — Ambulatory Visit (INDEPENDENT_AMBULATORY_CARE_PROVIDER_SITE_OTHER): Payer: Medicare Other | Admitting: Urology

## 2020-04-30 ENCOUNTER — Other Ambulatory Visit: Payer: Self-pay

## 2020-04-30 VITALS — BP 128/77 | HR 90 | Temp 98.1°F | Ht 73.0 in | Wt 200.0 lb

## 2020-04-30 DIAGNOSIS — I2510 Atherosclerotic heart disease of native coronary artery without angina pectoris: Secondary | ICD-10-CM | POA: Diagnosis not present

## 2020-04-30 DIAGNOSIS — R351 Nocturia: Secondary | ICD-10-CM

## 2020-04-30 DIAGNOSIS — N401 Enlarged prostate with lower urinary tract symptoms: Secondary | ICD-10-CM | POA: Diagnosis not present

## 2020-04-30 LAB — URINALYSIS, ROUTINE W REFLEX MICROSCOPIC
Bilirubin, UA: NEGATIVE
Glucose, UA: NEGATIVE
Ketones, UA: NEGATIVE
Leukocytes,UA: NEGATIVE
Nitrite, UA: NEGATIVE
Protein,UA: NEGATIVE
Specific Gravity, UA: 1.015 (ref 1.005–1.030)
Urobilinogen, Ur: 0.2 mg/dL (ref 0.2–1.0)
pH, UA: 6.5 (ref 5.0–7.5)

## 2020-04-30 LAB — MICROSCOPIC EXAMINATION
Bacteria, UA: NONE SEEN
Epithelial Cells (non renal): NONE SEEN /hpf (ref 0–10)
Renal Epithel, UA: NONE SEEN /hpf
WBC, UA: NONE SEEN /hpf (ref 0–5)

## 2020-04-30 MED ORDER — ALFUZOSIN HCL ER 10 MG PO TB24
10.0000 mg | ORAL_TABLET | Freq: Every day | ORAL | 3 refills | Status: DC
Start: 2020-04-30 — End: 2021-01-28

## 2020-04-30 NOTE — Progress Notes (Signed)
Urological Symptom Review  Patient is experiencing the following symptoms: Frequent urination Get up at night to urinate Weak stream   Review of Systems  Gastrointestinal (upper)  : Indigestion/heartburn  Gastrointestinal (lower) : Negative for lower GI symptoms  Constitutional : Fatigue  Skin: Skin rash/lesion  Eyes: Negative for eye symptoms  Ear/Nose/Throat : Sinus problems  Hematologic/Lymphatic: Easy bruising  Cardiovascular : Negative for cardiovascular symptoms  Respiratory : Cough Negative for respiratory symptoms  Endocrine: Negative for endocrine symptoms  Musculoskeletal: Back pain Joint pain  Neurological: Negative for neurological symptoms  Psychologic: Negative for psychiatric symptoms

## 2020-04-30 NOTE — Patient Instructions (Signed)

## 2020-05-01 DIAGNOSIS — Z23 Encounter for immunization: Secondary | ICD-10-CM | POA: Diagnosis not present

## 2020-05-05 ENCOUNTER — Encounter: Payer: Self-pay | Admitting: Cardiovascular Disease

## 2020-05-05 ENCOUNTER — Other Ambulatory Visit: Payer: Self-pay

## 2020-05-05 ENCOUNTER — Ambulatory Visit (INDEPENDENT_AMBULATORY_CARE_PROVIDER_SITE_OTHER): Payer: Medicare Other | Admitting: Cardiovascular Disease

## 2020-05-05 VITALS — BP 114/68 | HR 82 | Resp 16 | Ht 73.0 in | Wt 198.2 lb

## 2020-05-05 DIAGNOSIS — I2510 Atherosclerotic heart disease of native coronary artery without angina pectoris: Secondary | ICD-10-CM | POA: Diagnosis not present

## 2020-05-05 DIAGNOSIS — I714 Abdominal aortic aneurysm, without rupture, unspecified: Secondary | ICD-10-CM

## 2020-05-05 NOTE — Patient Instructions (Signed)
Medication Instructions:  Your physician recommends that you continue on your current medications as directed. Please refer to the Current Medication list given to you today.  *If you need a refill on your cardiac medications before your next appointment, please call your pharmacy*   Lab Work: NONE   If you have labs (blood work) drawn today and your tests are completely normal, you will receive your results only by: Marland Kitchen MyChart Message (if you have MyChart) OR . A paper copy in the mail If you have any lab test that is abnormal or we need to change your treatment, we will call you to review the results.   Testing/Procedures: Your physician has requested that you have an abdominal aorta duplex. During this test, an ultrasound is used to evaluate the aorta. Allow 30 minutes for this exam. Do not eat after midnight the day before and avoid carbonated beverages    Follow-Up: At [facility], you and your health needs are our priority.  As part of our continuing mission to provide you with exceptional heart care, we have created designated Provider Care Teams.  These Care Teams include your primary Cardiologist (physician) and Advanced Practice Providers (APPs -  Physician Assistants and Nurse Practitioners) who all work together to provide you with the care you need, when you need it.  We recommend signing up for the patient portal called "MyChart".  Sign up information is provided on this After Visit Summary.  MyChart is used to connect with patients for Virtual Visits (Telemedicine).  Patients are able to view lab/test results, encounter notes, upcoming appointments, etc.  Non-urgent messages can be sent to your provider as well.   To learn more about what you can do with MyChart, go to [email or web address].    Your next appointment:   6 month(s)  The format for your next appointment:   In Person  Provider:   You will see one of the following Advanced Practice Providers on your  designated Care Team:    Bernerd Pho, PA-C   Ermalinda Barrios, PA-C      Other Instructions Thank you for choosing [facility]!

## 2020-05-12 ENCOUNTER — Other Ambulatory Visit: Payer: Self-pay

## 2020-05-12 ENCOUNTER — Ambulatory Visit (HOSPITAL_COMMUNITY)
Admission: RE | Admit: 2020-05-12 | Discharge: 2020-05-12 | Disposition: A | Discharge status: Home / Self Care | Payer: Medicare Other | Source: Ambulatory Visit | Attending: Cardiovascular Disease | Admitting: Cardiovascular Disease

## 2020-05-12 DIAGNOSIS — I714 Abdominal aortic aneurysm, without rupture, unspecified: Secondary | ICD-10-CM

## 2020-05-13 NOTE — Progress Notes (Signed)
04/30/2020 7:46 AM   [name] [name] 1940-07-27 [id number]  Referring provider: Erven Colla, DO [address] [place],  [place] [address]  followup nocturia  HPI: [name] is a 79yo here for followup for BPH with nocturia. He has been on uroxatral 10mg  qhs and noted significant improvement in his nocturia. Nocturia decreased to 1-2x. Stream is strong. NO dysuria or hematuria. NO hesitancy, starting/stopping, straining to urinate, urinary frequency or urgency. Overall he is happy with his Urination   PMH: Past Medical History:  Diagnosis Date  . AAA (abdominal aortic aneurysm) ([place])    needs yearly ultrasound  . Allergy   . Anemia   . Arthritis   . Asthma   . BCC (basal cell carcinoma) 08/18/1989   left shoulder blad, upper right arm, left arm beyond elbow, c&d  . BCC (basal cell carcinoma) 01/31/1992   Posterior neck, curetx3, 30fu  . BCC (basal cell carcinoma) 11/22/2001   mid forehead, cx3, excision, right forearm, cx3, 59fu  . BCC (basal cell carcinoma) 10/09/2003   mid forehead, MOHs  . BCC (basal cell carcinoma) 08/15/2008   upper left back, biopsy  . BPH (benign prostatic hyperplasia)   . CAD (coronary artery disease)   . Cancer ([place])    skin cancer  . COPD (chronic obstructive pulmonary disease) ([place])   . Dysrhythmia    pt. states it can be fast at times  . GERD (gastroesophageal reflux disease)   . Glaucoma   . HOH (hard of hearing)   . Hypercholesterolemia   . Hypertension   . Impaired fasting glucose   . Low back pain   . Melanoma in situ ([place]) 10/09/2003   left chin, MOHs  . MI (myocardial infarction) ([place]) 1999  . SCC (squamous cell carcinoma) 07/03/2014   in situ, behind left ear, cx3, cautery, 79fu  . SCC (squamous cell carcinoma) 07/03/2014   well diff, left forearm, biopsy, cx1, cautery  . SCC (squamous cell carcinoma) 07/20/2017   in situ, left upper arm, cx3, 44fu  . SCC (squamous cell carcinoma) 01/10/2019   in situ, left post shoulder, cx3,  80fu  . SCC (squamous cell carcinoma) 11/22/2001   left forearm distal, left forearm, cx3, 91fu  . SCC (squamous cell carcinoma) 10/09/2003   [place], left ear post, clear per st, right cheek clear  . SCC (squamous cell carcinoma) 03/30/2004   in situ, left upper arm, cx3, 48fu  . SCC (squamous cell carcinoma) 03/08/2005   in situ, right cheek, mid upper forehead, cx3, 3fu  . SCC (squamous cell carcinoma) 06/08/2006   in situ, left shoulder, cx3, 11fu  . SCC (squamous cell carcinoma) 05/05/2010   right inner wrist, biopsy  . SCC (squamous cell carcinoma) 09/13/2013   in situ, right crown scalp, front scalp, biopsy  . Thrush     Surgical History: Past Surgical History:  Procedure Laterality Date  . BACK SURGERY     x 3  . CARDIAC CATHETERIZATION     angioplasty  . CATARACT EXTRACTION W/PHACO  03/20/2012   Procedure: CATARACT EXTRACTION PHACO AND INTRAOCULAR LENS PLACEMENT (IOC);  Surgeon: Williams Che, MD;  Location: [facility];  Service: Ophthalmology;  Laterality: Right;  CDE:  8.45  . CATARACT EXTRACTION W/PHACO Left 04/02/2013   Procedure: CATARACT EXTRACTION PHACO AND INTRAOCULAR LENS PLACEMENT (IOC);  Surgeon: Williams Che, MD;  Location: [facility];  Service: Ophthalmology;  Laterality: Left;  CDE:  6.50  . CHOLECYSTECTOMY  2000  . COLONOSCOPY  2009   repeat 5 years  . ESOPHAGOGASTRODUODENOSCOPY    . HERNIA REPAIR Left    inguinal  . INGUINAL HERNIA REPAIR Right 03/28/2020   Procedure: Right Inguinal Herniorrhaphy with Mesh;  Surgeon: Aviva Signs, MD;  Location: [facility];  Service: General;  Laterality: Right;  . LAPAROSCOPIC PARTIAL COLECTOMY N/A 06/11/2013   Procedure: LAPAROSCOPIC HAND ASSISTED PARTIAL COLECTOMY;  Surgeon: Jamesetta So, MD;  Location: [facility];  Service: General;  Laterality: N/A;  . NASAL ENDOSCOPY WITH EPISTAXIS CONTROL Bilateral 02/11/2020   Procedure: NASAL ENDOSCOPY WITH EPISTAXIS CONTROL;  Surgeon: Leta Baptist, MD;  Location: [facility];   Service: ENT;  Laterality: Bilateral;  . right eye detached retina Bilateral   . SPINAL FUSION  2016  . YAG LASER APPLICATION Left [id number]   Procedure: YAG LASER APPLICATION;  Surgeon: Williams Che, MD;  Location: [facility];  Service: Ophthalmology;  Laterality: Left;    Home Medications:  Allergies as of 04/30/2020      Reactions   Lasix [furosemide] Rash   Cefzil [cefprozil] Nausea Only   Dexamethasone Swelling   Gabapentin Swelling   Neomycin Other (See Comments)   Patient can't remember.   Tetracyclines & Related Itching   Ciprofloxacin Nausea And Vomiting, Rash, Other (See Comments)   Body aches   Methocarbamol Swelling, Rash   Penicillins Rash   Has patient had a PCN reaction causing immediate rash, facial/tongue/throat swelling, SOB or lightheadedness with hypotension: yes Has patient had a PCN reaction causing severe rash involving mucus membranes or skin necrosis: unknown Has patient had a PCN reaction that required hospitalization: no Has patient had a PCN reaction occurring within the last 10 years: No If all of the above answers are "NO", then may proceed with Cephalosporin use.      Medication List       Accurate as of April 30, 2020 11:59 PM. If you have any questions, ask your nurse or doctor.        STOP taking these medications   HYDROcodone-acetaminophen 5-325 MG tablet Commonly known as: Norco Stopped by: Nicolette Bang, MD   sulfamethoxazole-trimethoprim 800-160 MG tablet Commonly known as: BACTRIM DS Stopped by: Nicolette Bang, MD     TAKE these medications   alfuzosin 10 MG 24 hr tablet Commonly known as: UROXATRAL Take 1 tablet (10 mg total) by mouth daily with breakfast.   cetirizine 10 MG tablet Commonly known as: ZYRTEC Take 10 mg by mouth daily.   dorzolamide-timolol 22.3-6.8 MG/ML ophthalmic solution Commonly known as: COSOPT Place 1 drop into both eyes 2 (two) times daily.   esomeprazole 20 MG capsule Commonly known  as: NEXIUM Take 20 mg by mouth daily at 12 noon.   esomeprazole 40 MG capsule Commonly known as: NEXIUM 2 capsules   fluticasone 220 MCG/ACT inhaler Commonly known as: FLOVENT HFA Inhale 2 puffs into the lungs daily.   hydroxypropyl methylcellulose / hypromellose 2.5 % ophthalmic solution Commonly known as: ISOPTO TEARS / GONIOVISC Place 1 drop into both eyes 3 (three) times daily as needed for dry eyes.   LORazepam 0.5 MG tablet Commonly known as: ATIVAN TAKE 1 TABLET BY MOUTH ONCE DAILY AT BEDTIME.   losartan 25 MG tablet Commonly known as: COZAAR TAKE ONE TABLET BY MOUTH ONCE DAILY.   lovastatin 40 MG tablet Commonly known as: MEVACOR TAKE 1 TABLET BY MOUTH DAILY.   nitroGLYCERIN 0.4 MG SL tablet Commonly known as: NITROSTAT Place 1 tablet (0.4 mg total) under the tongue  every 5 (five) minutes as needed for chest pain.   albuterol 108 (90 Base) MCG/ACT inhaler Commonly known as: VENTOLIN HFA 2 puffs as needed What changed: Another medication with the same name was changed. Make sure you understand how and when to take each.   ProAir HFA 108 (90 Base) MCG/ACT inhaler Generic drug: albuterol INHALE 2 PUFFS INTO THE LUNGS EVERY SIX HOURS AS NEEDED FOR WHEEZING. What changed: See the new instructions.   albuterol (2.5 MG/3ML) 0.083% nebulizer solution Commonly known as: PROVENTIL INHALE 1 VIAL VIA NEBULIZER FOUR TIMES DAILY What changed: See the new instructions.   pseudoephedrine-guaifenesin 60-600 MG 12 hr tablet Commonly known as: MUCINEX D Take 1 tablet by mouth 2 (two) times daily as needed for congestion.   sodium chloride 0.65 % Soln nasal spray Commonly known as: OCEAN Place 1 spray into both nostrils as needed for congestion.       Allergies:  Allergies  Allergen Reactions  . Beta Adrenergic Blockers Diarrhea and Other (See Comments)  . Lasix [Furosemide] Rash  . Cefzil [Cefprozil] Nausea Only  . Dexamethasone Swelling  . Gabapentin Swelling  .  Neomycin Other (See Comments)    Patient can't remember.  . Tetracyclines & Related Itching  . Ciprofloxacin Nausea And Vomiting, Rash and Other (See Comments)    Body aches   . Methocarbamol Swelling and Rash  . Penicillins Rash    Has patient had a PCN reaction causing immediate rash, facial/tongue/throat swelling, SOB or lightheadedness with hypotension: yes Has patient had a PCN reaction causing severe rash involving mucus membranes or skin necrosis: unknown Has patient had a PCN reaction that required hospitalization: no Has patient had a PCN reaction occurring within the last 10 years: No If all of the above answers are "NO", then may proceed with Cephalosporin use.     Family History: Family History  Problem Relation Age of Onset  . Hypertension Mother   . COPD Father   . Cancer Brother        brain    Social History:  reports that he quit smoking about 17 years ago. His smoking use included cigarettes. He has a 35.00 pack-year smoking history. He has never used smokeless tobacco. He reports that he does not drink alcohol and does not use drugs.  ROS: All other review of systems were reviewed and are negative except what is noted above in HPI  Physical Exam: BP 128/77   Pulse 90   Temp 98.1 F (36.7 C)   Ht 6\' 1"  (1.854 m)   Wt 200 lb (90.7 kg)   BMI 26.39 kg/m   Constitutional:  Alert and oriented, No acute distress. HEENT: [place] AT, moist mucus membranes.  Trachea midline, no masses. Cardiovascular: No clubbing, cyanosis, or edema. Respiratory: Normal respiratory effort, no increased work of breathing. GI: Abdomen is soft, nontender, nondistended, no abdominal masses GU: No CVA tenderness.  Lymph: No cervical or inguinal lymphadenopathy. Skin: No rashes, bruises or suspicious lesions. Neurologic: Grossly intact, no focal deficits, moving all 4 extremities. Psychiatric: Normal mood and affect.  Laboratory Data: Lab Results  Component Value Date   WBC 10.3  03/18/2020   HGB 13.4 03/18/2020   HCT 42.9 03/18/2020   MCV 89.0 03/18/2020   PLT 214 03/18/2020    Lab Results  Component Value Date   CREATININE 0.88 03/18/2020    Lab Results  Component Value Date   PSA 1.80 07/26/2014   PSA 1.42 03/26/2013    No results found  for: TESTOSTERONE  No results found for: HGBA1C  Urinalysis    Component Value Date/Time   COLORURINE STRAW (A) 12/28/2019 1541   APPEARANCEUR Clear 04/30/2020 1058   LABSPEC 1.005 12/28/2019 1541   PHURINE 8.0 12/28/2019 1541   GLUCOSEU Negative 04/30/2020 1058   HGBUR NEGATIVE 12/28/2019 1541   BILIRUBINUR Negative 04/30/2020 1058   KETONESUR NEGATIVE 12/28/2019 1541   PROTEINUR Negative 04/30/2020 1058   PROTEINUR NEGATIVE 12/28/2019 1541   UROBILINOGEN 2.0 03/05/2016 1544   UROBILINOGEN 0.2 12/04/2014 0240   NITRITE Negative 04/30/2020 1058   NITRITE NEGATIVE 12/28/2019 1541   LEUKOCYTESUR Negative 04/30/2020 1058   LEUKOCYTESUR NEGATIVE 12/28/2019 1541    Lab Results  Component Value Date   LABMICR See below: 04/30/2020   WBCUA None seen 04/30/2020   LABEPIT None seen 04/30/2020   BACTERIA None seen 04/30/2020    Pertinent Imaging:  Results for orders placed during the hospital encounter of 11/11/14  DG Abd 1 View  Narrative CLINICAL DATA:  Difficulty urinating with burning and pelvic pressure for 6 days.  EXAM: ABDOMEN - 1 VIEW  COMPARISON:  None.  FINDINGS: The bowel gas pattern is nonobstructive. Prominent stool burden in the transverse colon is noted. No unexpected abdominal calcification is seen. The patient is status post lower lumbar fusion.  IMPRESSION: No acute abnormality.   Electronically Signed By: Inge Rise M.D. On: 11/11/2014 17:46  Results for orders placed in visit on 12/06/14  US Venous Img Lower Bilateral  Narrative CLINICAL DATA:  One month post L5-S1 fusion, presenting with bilateral lower extremity edema and elevated  D-dimer.  EXAM: BILATERAL LOWER EXTREMITY VENOUS DOPPLER ULTRASOUND  TECHNIQUE: Gray-scale sonography with graded compression, as well as color Doppler and duplex ultrasound were performed to evaluate the lower extremity deep venous systems from the level of the common femoral vein and including the common femoral, femoral, profunda femoral, popliteal and calf veins including the posterior tibial, peroneal and gastrocnemius veins when visible. The superficial great saphenous vein was also interrogated. Spectral Doppler was utilized to evaluate flow at rest and with distal augmentation maneuvers in the common femoral, femoral and popliteal veins.  COMPARISON:  None.  FINDINGS: RIGHT LOWER EXTREMITY  Common Femoral Vein: No evidence of thrombus. Normal compressibility, respiratory phasicity and response to augmentation.  Saphenofemoral Junction: No evidence of thrombus. Normal compressibility and flow on color Doppler imaging.  Profunda Femoral Vein: No evidence of thrombus. Normal compressibility and flow on color Doppler imaging.  Femoral Vein: No evidence of thrombus. Normal compressibility, respiratory phasicity and response to augmentation.  Popliteal Vein: No evidence of thrombus. Normal compressibility, respiratory phasicity and response to augmentation.  Calf Veins: No evidence of thrombus. Normal compressibility and flow on color Doppler imaging.  Superficial Great Saphenous Vein: No evidence of thrombus. Normal compressibility and flow on color Doppler imaging.  Venous Reflux:  Not evaluated.  Other Findings:  None.  LEFT LOWER EXTREMITY  Common Femoral Vein: No evidence of thrombus. Normal compressibility, respiratory phasicity and response to augmentation.  Saphenofemoral Junction: No evidence of thrombus. Normal compressibility and flow on color Doppler imaging.  Profunda Femoral Vein: No evidence of thrombus. Normal compressibility and flow on color  Doppler imaging.  Femoral Vein: No evidence of thrombus. Normal compressibility, respiratory phasicity and response to augmentation.  Popliteal Vein: No evidence of thrombus. Normal compressibility, respiratory phasicity and response to augmentation.  Calf Veins: No evidence of thrombus. Normal compressibility and flow on color Doppler imaging.  Superficial Great Saphenous Vein: No  evidence of thrombus. Normal compressibility and flow on color Doppler imaging.  Venous Reflux:  Not evaluated.  Other Findings:  None.  IMPRESSION: No evidence of DVT involving the right or left lower extremity.   Electronically Signed By: Hulan Saas M.D. On: 12/06/2014 17:15  No results found for this or any previous visit.  No results found for this or any previous visit.  No results found for this or any previous visit.  No results found for this or any previous visit.  No results found for this or any previous visit.  No results found for this or any previous visit.   Assessment & Plan:    1. BPH associated with nocturia -Continue uroxatral 10mg  qhs. RTC 6 months - Urinalysis, Routine w reflex microscopic   Return in about 6 months (around 10/29/2020).  10/31/2020, MD  [facility] [place]

## 2020-05-14 ENCOUNTER — Other Ambulatory Visit: Payer: Self-pay | Admitting: Family Medicine

## 2020-05-19 ENCOUNTER — Ambulatory Visit: Payer: Medicare Other | Admitting: Adult Health

## 2020-05-21 DIAGNOSIS — H401122 Primary open-angle glaucoma, left eye, moderate stage: Secondary | ICD-10-CM | POA: Diagnosis not present

## 2020-05-21 DIAGNOSIS — H1045 Other chronic allergic conjunctivitis: Secondary | ICD-10-CM | POA: Diagnosis not present

## 2020-05-26 ENCOUNTER — Other Ambulatory Visit: Payer: Self-pay

## 2020-05-26 ENCOUNTER — Other Ambulatory Visit: Payer: Self-pay | Admitting: Acute Care

## 2020-05-26 ENCOUNTER — Ambulatory Visit (INDEPENDENT_AMBULATORY_CARE_PROVIDER_SITE_OTHER): Payer: Medicare Other

## 2020-05-26 ENCOUNTER — Ambulatory Visit (INDEPENDENT_AMBULATORY_CARE_PROVIDER_SITE_OTHER): Payer: Medicare Other | Admitting: Acute Care

## 2020-05-26 ENCOUNTER — Encounter: Payer: Self-pay | Admitting: Acute Care

## 2020-05-26 VITALS — BP 118/74 | HR 80 | Temp 98.0°F | Ht 73.0 in | Wt 200.2 lb

## 2020-05-26 DIAGNOSIS — J189 Pneumonia, unspecified organism: Secondary | ICD-10-CM

## 2020-05-26 DIAGNOSIS — J441 Chronic obstructive pulmonary disease with (acute) exacerbation: Secondary | ICD-10-CM

## 2020-05-26 MED ORDER — AZITHROMYCIN 250 MG PO TABS: 250.0000 mg | ORAL_TABLET | Freq: Every day | ORAL | 0 refills | Status: DC

## 2020-05-26 NOTE — Progress Notes (Signed)
History of Present Illness [name] [name] is a 80 y.o. male former smoker with COPD with chronic bronchitis and emphysema. He is followed by Dr. Ander [name].   05/26/2020  OV for acute flare of bronchitis.He is currently using his Flovent as his maintenance .He did try Anoro and was asked to stop as he has issues with his eyes. He was then trued on [facility], which he states did not help him at all. He then resumed his Flovent as this helps him the most.  He does not use his rescue inhaler. He has been using his nebulizer treatments 3-4 times daily  . He has a productive cough 3-4 times a day. They are yellow to green in color.  He has chest congestion. He states he has shortness of breath only when he has trouble coughing up secretions. He has not been using his Flutter valve often. He has not been using Mucinex or  Mucolytic.  He denies any fever or chest pain. No orthopnea or hemoptysis.    Test Results: CXR 05/26/2020 COPD with chronic bronchitis and emphysema   PFT's 10/2019         CBC Latest Ref Rng & Units 03/18/2020 12/28/2019 11/16/2019  WBC 4.0 - 10.5 K/uL 10.3 6.9 7.9  Hemoglobin 13.0 - 17.0 g/dL 13.4 13.5 12.2(L)  Hematocrit 39.0 - 52.0 % 42.9 42.7 39.4  Platelets 150 - 400 K/uL 214 231 203    BMP Latest Ref Rng & Units 03/18/2020 12/28/2019 11/16/2019  Glucose 70 - 99 mg/dL 110(H) 115(H) 107(H)  BUN 8 - 23 mg/dL 11 13 7(L)  Creatinine 0.61 - 1.24 mg/dL 0.88 0.76 0.72  BUN/Creat Ratio 10 - 24 - - -  Sodium 135 - 145 mmol/L 135 138 141  Potassium 3.5 - 5.1 mmol/L 4.0 3.9 3.6  Chloride 98 - 111 mmol/L 101 102 100  CO2 22 - 32 mmol/L 26 29 30   Calcium 8.9 - 10.3 mg/dL 8.7(L) 9.0 8.7(L)    BNP    Component Value Date/Time   BNP 44.0 09/16/2016 0235    ProBNP No results found for: PROBNP  PFT    Component Value Date/Time   FEV1PRE 1.58 11/05/2019 1342   FEV1POST 1.72 11/05/2019 1342   FVCPRE 2.80 11/05/2019 1342   FVCPOST 2.82 11/05/2019 1342   DLCOUNC 32.71  11/05/2019 1342   PREFEV1FVCRT 56 11/05/2019 1342   PSTFEV1FVCRT 61 11/05/2019 1342    US Abdomen Complete  Result Date: 05/12/2020 CLINICAL DATA:  Abdominal aortic aneurysm. EXAM: ABDOMEN ULTRASOUND COMPLETE COMPARISON:  CT on 03/18/2020 FINDINGS: Gallbladder: Surgically absent. Common bile duct: Diameter: 7 mm, within normal limits post cholecystectomy. Liver: No focal lesion identified. Within normal limits in parenchymal echogenicity. Portal vein is patent on color Doppler imaging with normal direction of blood flow towards the liver. IVC: No abnormality visualized. Pancreas: Not visualized due to overlying bowel gas. Spleen: Poorly visualized.  No evidence of splenomegaly. Right Kidney: Length: 10.9 cm. Echogenicity within normal limits. 1.4 cm cyst noted in upper pole. No mass or hydronephrosis visualized. Left Kidney: Length: 12.5 cm. Echogenicity within normal limits. No mass or hydronephrosis visualized. Abdominal aorta: Abdominal aortic aneurysm measures 4.6 cm, compared to 5.2 cm on recent CT. Other findings: None. IMPRESSION: Abdominal aortic aneurysm measures 4.6 cm, compared to 5.2 cm on recent CT. CT is considered more accurate than ultrasound. Recommend follow-up CT/MR every 6 months and vascular consultation. This recommendation follows ACR consensus guidelines: White Paper of the ACR Incidental Findings Committee II on  Vascular Findings. J Am Coll Radiol 2013; 10:789-794. Prior cholecystectomy.  No evidence of biliary ductal dilatation. Electronically Signed   By: [name] [name] M.D.   On: 05/12/2020 10:29     Past medical hx Past Medical History:  Diagnosis Date  . AAA (abdominal aortic aneurysm) ([place])    needs yearly ultrasound  . Allergy   . Anemia   . Arthritis   . Asthma   . BCC (basal cell carcinoma) 08/18/1989   left shoulder blad, upper right arm, left arm beyond elbow, c&d  . BCC (basal cell carcinoma) 01/31/1992   Posterior neck, curetx3, 10fu  . BCC (basal cell  carcinoma) 11/22/2001   mid forehead, cx3, excision, right forearm, cx3, 33fu  . BCC (basal cell carcinoma) 10/09/2003   mid forehead, MOHs  . BCC (basal cell carcinoma) 08/15/2008   upper left back, biopsy  . BPH (benign prostatic hyperplasia)   . CAD (coronary artery disease)   . Cancer ([place])    skin cancer  . COPD (chronic obstructive pulmonary disease) ([place])   . Dysrhythmia    pt. states it can be fast at times  . GERD (gastroesophageal reflux disease)   . Glaucoma   . HOH (hard of hearing)   . Hypercholesterolemia   . Hypertension   . Impaired fasting glucose   . Low back pain   . Melanoma in situ ([place]) 10/09/2003   left chin, MOHs  . MI (myocardial infarction) ([place]) 1999  . SCC (squamous cell carcinoma) 07/03/2014   in situ, behind left ear, cx3, cautery, 22fu  . SCC (squamous cell carcinoma) 07/03/2014   well diff, left forearm, biopsy, cx1, cautery  . SCC (squamous cell carcinoma) 07/20/2017   in situ, left upper arm, cx3, 49fu  . SCC (squamous cell carcinoma) 01/10/2019   in situ, left post shoulder, cx3, 49fu  . SCC (squamous cell carcinoma) 11/22/2001   left forearm distal, left forearm, cx3, 57fu  . SCC (squamous cell carcinoma) 10/09/2003   [place], left ear post, clear per st, right cheek clear  . SCC (squamous cell carcinoma) 03/30/2004   in situ, left upper arm, cx3, 5fu  . SCC (squamous cell carcinoma) 03/08/2005   in situ, right cheek, mid upper forehead, cx3, 69fu  . SCC (squamous cell carcinoma) 06/08/2006   in situ, left shoulder, cx3, 41fu  . SCC (squamous cell carcinoma) 05/05/2010   right inner wrist, biopsy  . SCC (squamous cell carcinoma) 09/13/2013   in situ, right crown scalp, front scalp, biopsy  . Thrush      Social History   Tobacco Use  . Smoking status: Former Smoker    Packs/day: 1.00    Years: 35.00    Pack years: 35.00    Types: Cigarettes    Quit date: 03/28/2003    Years since quitting: 17.1  . Smokeless tobacco: Never Used   Vaping Use  . Vaping Use: Never used  Substance Use Topics  . Alcohol use: No    Alcohol/week: 0.0 standard drinks  . Drug use: No    RogerSolomon reports that he quit smoking about 17 years ago. His smoking use included cigarettes. He has a 35.00 pack-year smoking history. He has never used smokeless tobacco. He reports that he does not drink alcohol and does not use drugs.  Tobacco Cessation: Former smoker quit 2004 with a 35 pack year smoking history  Past surgical hx, Family hx, Social hx all reviewed.  Current Outpatient Medications on File Prior to Visit  Medication Sig  . albuterol (PROVENTIL) (2.5 MG/3ML) 0.083% nebulizer solution INHALE 1 VIAL VIA NEBULIZER FOUR TIMES DAILY (Patient taking differently: Take 2.5 mg by nebulization every 6 (six) hours as needed for wheezing or shortness of breath.)  . albuterol (VENTOLIN HFA) 108 (90 Base) MCG/ACT inhaler 2 puffs as needed  . alfuzosin (UROXATRAL) 10 MG 24 hr tablet Take 1 tablet (10 mg total) by mouth daily with breakfast.  . cetirizine (ZYRTEC) 10 MG tablet Take 10 mg by mouth daily.  . dorzolamide-timolol (COSOPT) 22.3-6.8 MG/ML ophthalmic solution Place 1 drop into both eyes 2 (two) times daily.   Marland Kitchen esomeprazole (NEXIUM) 20 MG capsule Take 20 mg by mouth daily at 12 noon.  Marland Kitchen esomeprazole (NEXIUM) 40 MG capsule 2 capsules  . fluticasone (FLOVENT HFA) 220 MCG/ACT inhaler Inhale 2 puffs into the lungs daily.   . hydroxypropyl methylcellulose / hypromellose (ISOPTO TEARS / GONIOVISC) 2.5 % ophthalmic solution Place 1 drop into both eyes 3 (three) times daily as needed for dry eyes.  Marland Kitchen LORazepam (ATIVAN) 0.5 MG tablet TAKE 1 TABLET BY MOUTH ONCE DAILY AT BEDTIME.  Marland Kitchen losartan (COZAAR) 25 MG tablet TAKE ONE TABLET BY MOUTH ONCE DAILY.  Marland Kitchen lovastatin (MEVACOR) 40 MG tablet TAKE 1 TABLET BY MOUTH DAILY.  . nitroGLYCERIN (NITROSTAT) 0.4 MG SL tablet Place 1 tablet (0.4 mg total) under the tongue every 5 (five) minutes as needed for chest  pain.  Marland Kitchen PROAIR HFA 108 (90 Base) MCG/ACT inhaler INHALE 2 PUFFS INTO THE LUNGS EVERY SIX HOURS AS NEEDED FOR WHEEZING. (Patient taking differently: Inhale 2 puffs into the lungs every 6 (six) hours as needed for wheezing.)  . pseudoephedrine-guaifenesin (MUCINEX D) 60-600 MG 12 hr tablet Take 1 tablet by mouth 2 (two) times daily as needed for congestion.  . sodium chloride (OCEAN) 0.65 % SOLN nasal spray Place 1 spray into both nostrils as needed for congestion.   No current facility-administered medications on file prior to visit.     Allergies  Allergen Reactions  . Beta Adrenergic Blockers Diarrhea and Other (See Comments)  . Lasix [Furosemide] Rash  . Cefzil [Cefprozil] Nausea Only  . Dexamethasone Swelling  . Gabapentin Swelling  . Neomycin Other (See Comments)    Patient can't remember.  . Tetracyclines & Related Itching  . Ciprofloxacin Nausea And Vomiting, Rash and Other (See Comments)    Body aches   . Methocarbamol Swelling and Rash  . Penicillins Rash    Has patient had a PCN reaction causing immediate rash, facial/tongue/throat swelling, SOB or lightheadedness with hypotension: yes Has patient had a PCN reaction causing severe rash involving mucus membranes or skin necrosis: unknown Has patient had a PCN reaction that required hospitalization: no Has patient had a PCN reaction occurring within the last 10 years: No If all of the above answers are "NO", then may proceed with Cephalosporin use.     Review Of Systems:  Constitutional:   No  weight loss, night sweats,  Fevers, chills, fatigue, or  lassitude.  HEENT:   No headaches,  Difficulty swallowing,  Tooth/dental problems, or  Sore throat,                No sneezing, itching, ear ache, nasal congestion, post nasal drip,   CV:  No chest pain,  Orthopnea, PND, swelling in lower extremities, anasarca, dizziness, palpitations, syncope.   GI  No heartburn, indigestion, abdominal pain, nausea, vomiting, diarrhea,  change in bowel habits, loss of appetite, bloody stools.  Resp: No shortness of breath with exertion or at rest.  + excess mucus, + productive cough,  No non-productive cough,  No coughing up of blood.  + change in color of mucus.  No wheezing.  No chest wall deformity  Skin: no rash or lesions.  GU: no dysuria, change in color of urine, no urgency or frequency.  No flank pain, no hematuria   MS:  No joint pain or swelling.  No decreased range of motion.  No back pain.  Psych:  No change in mood or affect. No depression or anxiety.  No memory loss.   Vital Signs BP 118/74 (BP Location: Left Arm, Cuff Size: Normal)   Pulse 80   Temp 98 F (36.7 C) (Oral)   Ht 6\' 1"  (1.854 m)   Wt 200 lb 3.2 oz (90.8 kg)   SpO2 98%   BMI 26.41 kg/m    Physical Exam:  General- No distress,  A&Ox3, pleasant ENT: No sinus tenderness, TM clear, pale nasal mucosa, no oral exudate,no post nasal drip, no LAN, HOH Cardiac: S1, S2, regular rate and rhythm, no murmur Chest: No wheeze/ rales/ dullness; no accessory muscle use, no nasal flaring, no sternal retractions, diminished per bases Abd.: Soft Non-tender, ND, BS +, Body mass index is 26.41 kg/m. Ext: No clubbing cyanosis, edema Neuro:  normal strength, MAE x 4, A&O x 3 Skin: No rashes, No lesions, warm and dry Psych: normal mood and behavior   Assessment/Plan COPD flare/ Bronchitis flare Plan We will do a CXR today We will call you with the results. Continue Flovent 2 puffs daily Rinse mouth after use. We will send in a prescription for a z pack. This is azithromycin, and antibiotic.  Take 2 tablets today, and then 1 tablet once daily for the next 4 days.  Take until medication is gone. Add Plain Mucinex 1200 mg daily x 1 week Take with full glass of water.  This will help thin your mucus and make it easier to cough up.  Start using your flutter valve 3-4 times daily. This will help get the chest congestion under control. Follow up in  2 weeks with Dr. Ander [name] or Eric Form, NP Please contact office for sooner follow up if symptoms do not improve or worsen or seek emergency care     Magdalen Spatz, NP 05/26/2020  12:12 PM

## 2020-05-26 NOTE — Patient Instructions (Addendum)
It is good to see you today. We will do a CXR today We will call you with the results. Do Flovent 2 puffs twice daily while sick ( x 1 week)  Continue Flovent 2 puffs daily Rinse mouth after use. Albuterol nebs as needed up to every 6 hours. Use twice daily x 1 week while sick, then you can do only as needed.  We will send in a prescription for a z pack. This is azithromycin, and antibiotic.  Take 2 tablets today, and then 1 tablet once daily for the next 4 days.  Take until medication is gone. Add Plain Mucinex 1200 mg daily x 1 week Take with full glass of water.  This will help thin your mucus and make it easier to cough up.  Start using your flutter valve 3-4 times daily. This will help get the chest congestion under control. Follow up in 2 weeks with Dr. Ander Slade or Eric Form, NP Please contact office for sooner follow up if symptoms do not improve or worsen or seek emergency care

## 2020-05-27 ENCOUNTER — Ambulatory Visit (INDEPENDENT_AMBULATORY_CARE_PROVIDER_SITE_OTHER): Payer: Medicare Other | Admitting: Orthopaedic Surgery

## 2020-05-27 ENCOUNTER — Encounter: Payer: Self-pay | Admitting: Orthopaedic Surgery

## 2020-05-27 ENCOUNTER — Other Ambulatory Visit: Payer: Self-pay

## 2020-05-27 DIAGNOSIS — M25512 Pain in left shoulder: Secondary | ICD-10-CM | POA: Diagnosis not present

## 2020-05-27 NOTE — Progress Notes (Signed)
PROCEDURE NOTE:  The patient request injection, verbal consent was obtained.  The left shoulder was prepped appropriately after time out was performed.   Sterile technique was observed and injection of 1 cc of Depo-Medrol 40 mg with several cc's of plain xylocaine. Anesthesia was provided by ethyl chloride and a 20-gauge needle was used to inject the shoulder area. A posterior approach was used.  The injection was tolerated well.  A band aid dressing was applied.  The patient was advised to apply ice later today and tomorrow to the injection sight as needed.  See as needed.  Electronically Signed Sanjuana Kava, MD 1/11/20222:39 PM

## 2020-05-28 DIAGNOSIS — H43811 Vitreous degeneration, right eye: Secondary | ICD-10-CM | POA: Diagnosis not present

## 2020-05-28 DIAGNOSIS — H35373 Puckering of macula, bilateral: Secondary | ICD-10-CM | POA: Diagnosis not present

## 2020-05-28 DIAGNOSIS — H31092 Other chorioretinal scars, left eye: Secondary | ICD-10-CM | POA: Diagnosis not present

## 2020-05-30 ENCOUNTER — Other Ambulatory Visit: Payer: Self-pay

## 2020-05-30 ENCOUNTER — Other Ambulatory Visit: Payer: Self-pay | Admitting: Family Medicine

## 2020-05-30 MED ORDER — ALBUTEROL SULFATE HFA 108 (90 BASE) MCG/ACT IN AERS: INHALATION_SPRAY | RESPIRATORY_TRACT | 2 refills | Status: DC

## 2020-05-30 NOTE — Telephone Encounter (Signed)
Pt needs refill on albuterol (VENTOLIN HFA) 108 (90 Base) MCG/ACT inhaler the inhaler he has expired. Send RX to [place], [place]    Pt call back [phone]

## 2020-06-11 ENCOUNTER — Ambulatory Visit: Payer: Medicare Other | Admitting: Acute Care

## 2020-06-12 ENCOUNTER — Other Ambulatory Visit: Payer: Self-pay | Admitting: Family Medicine

## 2020-06-13 ENCOUNTER — Other Ambulatory Visit: Payer: Self-pay | Admitting: Family Medicine

## 2020-06-15 ENCOUNTER — Telehealth: Payer: Self-pay

## 2020-06-16 ENCOUNTER — Other Ambulatory Visit: Payer: Self-pay

## 2020-06-16 ENCOUNTER — Encounter: Payer: Self-pay | Admitting: Acute Care

## 2020-06-16 ENCOUNTER — Ambulatory Visit (INDEPENDENT_AMBULATORY_CARE_PROVIDER_SITE_OTHER): Payer: Medicare Other | Admitting: Acute Care

## 2020-06-16 VITALS — BP 130/66 | HR 76 | Temp 97.4°F | Ht 73.0 in | Wt 200.0 lb

## 2020-06-16 DIAGNOSIS — Z87891 Personal history of nicotine dependence: Secondary | ICD-10-CM

## 2020-06-16 DIAGNOSIS — J441 Chronic obstructive pulmonary disease with (acute) exacerbation: Secondary | ICD-10-CM

## 2020-06-16 DIAGNOSIS — J209 Acute bronchitis, unspecified: Secondary | ICD-10-CM | POA: Diagnosis not present

## 2020-06-16 DIAGNOSIS — J440 Chronic obstructive pulmonary disease with acute lower respiratory infection: Secondary | ICD-10-CM

## 2020-06-16 MED ORDER — PREDNISONE 10 MG PO TABS: ORAL_TABLET | ORAL | 0 refills | Status: DC

## 2020-06-16 NOTE — Addendum Note (Signed)
Addended by: Doroteo Glassman D on: 06/16/2020 03:17 PM   Modules accepted: Orders

## 2020-06-16 NOTE — Patient Instructions (Addendum)
It is good to see you today. Continue using Flovent daily as you have been doing. Rinse mouth after use Use your albuterol as needed for breakthrough shortness of breath or wheezing. Prednisone taper; 10 mg tablets: 4 tabs x 2 days, 3 tabs x 2 days, 2 tabs x 2 days 1 tab x 2 days then stop. Continue using the plain Mucinex with a full glass of water  1200 mg once daily. Use Flutter valve 3-4 times daily when you have chest congestion.  Referral to Pulmonary rehab ( Under Dr. Ander Slade)  Follow up with [name] [name] or Dr. Ander Slade in 3 months or sooner as needed. Please contact office for sooner follow up if symptoms do not improve or worsen or seek emergency care

## 2020-06-16 NOTE — Progress Notes (Signed)
History of Present Illness [name] [name] is a 80 y.o. male former  smoker ( quit 2004 with a 35 pack year smoking history) with COPD with chronic bronchitis and emphysema. He is followed by Dr. Ander [name]. Maintenance medication  is Flovent Rescue is Albuterol  06/16/2020 Follow up Pt. Presents for follow up after  flare of bronchitis with  yellow secretions. He was seen in the office 05/26/2020 and was treated with a z pack and Mucinex. I asked him to use his flutter valve 3-4 times daily to help with his chest congestion. As he was not wheezing we did not treat with prednisone. CXR done 1/10 showed COPD with chronic bronchitis and emphysema, but no acute issues.   He presents today stating he is about 75%  better. He does still have a cough but this is much better. Secretions are less in volume. They are thick and they remain yellow in color. He has stopped using his Mucinex and Flutter valve as he was feeling better. We have discussed continuing this until his secretions are thinner. He denies any fever, chest pain, orthopnea or hemoptysis.    Test Results: CXR 05/26/2020 Emphysematous changes. No pneumothorax or pleural effusion. No focal consolidation. Left hemidiaphragm eventration, unchanged. Stable cardiomediastinal silhouette. No acute osseous abnormality. No focal consolidation.  Emphysema.  CBC Latest Ref Rng & Units 03/18/2020 12/28/2019 11/16/2019  WBC 4.0 - 10.5 K/uL 10.3 6.9 7.9  Hemoglobin 13.0 - 17.0 g/dL 13.4 13.5 12.2(L)  Hematocrit 39.0 - 52.0 % 42.9 42.7 39.4  Platelets 150 - 400 K/uL 214 231 203    BMP Latest Ref Rng & Units 03/18/2020 12/28/2019 11/16/2019  Glucose 70 - 99 mg/dL 110(H) 115(H) 107(H)  BUN 8 - 23 mg/dL 11 13 7(L)  Creatinine 0.61 - 1.24 mg/dL 0.88 0.76 0.72  BUN/Creat Ratio 10 - 24 - - -  Sodium 135 - 145 mmol/L 135 138 141  Potassium 3.5 - 5.1 mmol/L 4.0 3.9 3.6  Chloride 98 - 111 mmol/L 101 102 100  CO2 22 - 32 mmol/L 26 29 30   Calcium 8.9 - 10.3 mg/dL  8.7(L) 9.0 8.7(L)    BNP    Component Value Date/Time   BNP 44.0 09/16/2016 0235    ProBNP No results found for: PROBNP  PFT    Component Value Date/Time   FEV1PRE 1.58 11/05/2019 1342   FEV1POST 1.72 11/05/2019 1342   FVCPRE 2.80 11/05/2019 1342   FVCPOST 2.82 11/05/2019 1342   DLCOUNC 32.71 11/05/2019 1342   PREFEV1FVCRT 56 11/05/2019 1342   PSTFEV1FVCRT 61 11/05/2019 1342    DG Chest 2 View  Result Date: 05/26/2020 CLINICAL DATA:  follow up on pneumonia EXAM: CHEST - 2 VIEW COMPARISON:  12/28/2019 and prior. FINDINGS: Emphysematous changes. No pneumothorax or pleural effusion. No focal consolidation. Left hemidiaphragm eventration, unchanged. Stable cardiomediastinal silhouette. No acute osseous abnormality. IMPRESSION: No focal consolidation.  Emphysema. Electronically Signed   By: Primitivo Gauze M.D.   On: 05/26/2020 14:16     Past medical hx Past Medical History:  Diagnosis Date  . AAA (abdominal aortic aneurysm) ([place])    needs yearly ultrasound  . Allergy   . Anemia   . Arthritis   . Asthma   . BCC (basal cell carcinoma) 08/18/1989   left shoulder blad, upper right arm, left arm beyond elbow, c&d  . BCC (basal cell carcinoma) 01/31/1992   Posterior neck, curetx3, 59fu  . BCC (basal cell carcinoma) 11/22/2001   mid forehead, cx3, excision, right  forearm, cx3, 78fu  . BCC (basal cell carcinoma) 10/09/2003   mid forehead, MOHs  . BCC (basal cell carcinoma) 08/15/2008   upper left back, biopsy  . BPH (benign prostatic hyperplasia)   . CAD (coronary artery disease)   . Cancer ([place])    skin cancer  . COPD (chronic obstructive pulmonary disease) ([place])   . Dysrhythmia    pt. states it can be fast at times  . GERD (gastroesophageal reflux disease)   . Glaucoma   . HOH (hard of hearing)   . Hypercholesterolemia   . Hypertension   . Impaired fasting glucose   . Low back pain   . Melanoma in situ ([place]) 10/09/2003   left chin, MOHs  . MI (myocardial  infarction) ([place]) 1999  . SCC (squamous cell carcinoma) 07/03/2014   in situ, behind left ear, cx3, cautery, 95fu  . SCC (squamous cell carcinoma) 07/03/2014   well diff, left forearm, biopsy, cx1, cautery  . SCC (squamous cell carcinoma) 07/20/2017   in situ, left upper arm, cx3, 84fu  . SCC (squamous cell carcinoma) 01/10/2019   in situ, left post shoulder, cx3, 70fu  . SCC (squamous cell carcinoma) 11/22/2001   left forearm distal, left forearm, cx3, 63fu  . SCC (squamous cell carcinoma) 10/09/2003   [place], left ear post, clear per st, right cheek clear  . SCC (squamous cell carcinoma) 03/30/2004   in situ, left upper arm, cx3, 47fu  . SCC (squamous cell carcinoma) 03/08/2005   in situ, right cheek, mid upper forehead, cx3, 75fu  . SCC (squamous cell carcinoma) 06/08/2006   in situ, left shoulder, cx3, 25fu  . SCC (squamous cell carcinoma) 05/05/2010   right inner wrist, biopsy  . SCC (squamous cell carcinoma) 09/13/2013   in situ, right crown scalp, front scalp, biopsy  . Thrush      Social History   Tobacco Use  . Smoking status: Former Smoker    Packs/day: 1.00    Years: 35.00    Pack years: 35.00    Types: Cigarettes    Quit date: 03/28/2003    Years since quitting: 17.2  . Smokeless tobacco: Never Used  Vaping Use  . Vaping Use: Never used  Substance Use Topics  . Alcohol use: No    Alcohol/week: 0.0 standard drinks  . Drug use: No    Mr.[name] [name] reports that he quit smoking about 17 years ago. His smoking use included cigarettes. He has a 35.00 pack-year smoking history. He has never used smokeless tobacco. He reports that he does not drink alcohol and does not use drugs.  Tobacco Cessation: Former smoker , Quit 2004 with a 35 pack year smoking history  Past surgical hx, Family hx, Social hx all reviewed.  Current Outpatient Medications on File Prior to Visit  Medication Sig  . albuterol (PROAIR HFA) 108 (90 Base) MCG/ACT inhaler INHALE 2 PUFFS INTO THE LUNGS  EVERY SIX HOURS AS NEEDED FOR WHEEZING.  Marland Kitchen albuterol (PROVENTIL) (2.5 MG/3ML) 0.083% nebulizer solution INHALE 1 VIAL VIA NEBULIZER FOUR TIMES DAILY (Patient taking differently: Take 2.5 mg by nebulization every 6 (six) hours as needed for wheezing or shortness of breath.)  . albuterol (VENTOLIN HFA) 108 (90 Base) MCG/ACT inhaler 2 puffs as needed  . alfuzosin (UROXATRAL) 10 MG 24 hr tablet Take 1 tablet (10 mg total) by mouth daily with breakfast.  . cetirizine (ZYRTEC) 10 MG tablet Take 10 mg by mouth daily.  . dorzolamide-timolol (COSOPT) 22.3-6.8 MG/ML ophthalmic solution Place 1  drop into both eyes 2 (two) times daily.   Marland Kitchen esomeprazole (NEXIUM) 40 MG capsule 2 capsules  . fluticasone (FLOVENT HFA) 220 MCG/ACT inhaler Inhale 2 puffs into the lungs daily.   . hydroxypropyl methylcellulose / hypromellose (ISOPTO TEARS / GONIOVISC) 2.5 % ophthalmic solution Place 1 drop into both eyes 3 (three) times daily as needed for dry eyes.  Marland Kitchen LORazepam (ATIVAN) 0.5 MG tablet TAKE 1 TABLET BY MOUTH ONCE DAILY AT BEDTIME.  Marland Kitchen losartan (COZAAR) 25 MG tablet TAKE ONE TABLET BY MOUTH ONCE DAILY.  Marland Kitchen lovastatin (MEVACOR) 40 MG tablet TAKE 1 TABLET BY MOUTH DAILY.  . nitroGLYCERIN (NITROSTAT) 0.4 MG SL tablet Place 1 tablet (0.4 mg total) under the tongue every 5 (five) minutes as needed for chest pain.  . pseudoephedrine-guaifenesin (MUCINEX D) 60-600 MG 12 hr tablet Take 1 tablet by mouth 2 (two) times daily as needed for congestion.  . sodium chloride (OCEAN) 0.65 % SOLN nasal spray Place 1 spray into both nostrils as needed for congestion.  Marland Kitchen azithromycin (ZITHROMAX) 250 MG tablet Take 1 tablet (250 mg total) by mouth daily. Take 2 tablets today, then one tablet daily x 4 days. (Patient not taking: No sig reported)  . esomeprazole (NEXIUM) 20 MG capsule Take 20 mg by mouth daily at 12 noon. (Patient not taking: Reported on 06/16/2020)   No current facility-administered medications on file prior to visit.      Allergies  Allergen Reactions  . Beta Adrenergic Blockers Diarrhea and Other (See Comments)  . Lasix [Furosemide] Rash  . Cefzil [Cefprozil] Nausea Only  . Dexamethasone Swelling  . Gabapentin Swelling  . Neomycin Other (See Comments)    Patient can't remember.  . Tetracyclines & Related Itching  . Ciprofloxacin Nausea And Vomiting, Rash and Other (See Comments)    Body aches   . Methocarbamol Swelling and Rash  . Penicillins Rash    Has patient had a PCN reaction causing immediate rash, facial/tongue/throat swelling, SOB or lightheadedness with hypotension: yes Has patient had a PCN reaction causing severe rash involving mucus membranes or skin necrosis: unknown Has patient had a PCN reaction that required hospitalization: no Has patient had a PCN reaction occurring within the last 10 years: No If all of the above answers are "NO", then may proceed with Cephalosporin use.     Review Of Systems:  Constitutional:   No  weight loss, night sweats,  Fevers, chills, fatigue, or  lassitude.  HEENT:   No headaches,  Difficulty swallowing,  Tooth/dental problems, or  Sore throat,                No sneezing, itching, ear ache, nasal congestion, post nasal drip,   CV:  No chest pain,  Orthopnea, PND, swelling in lower extremities, anasarca, dizziness, palpitations, syncope.   GI  No heartburn, indigestion, abdominal pain, nausea, vomiting, diarrhea, change in bowel habits, loss of appetite, bloody stools.   Resp: No shortness of breath with exertion or at rest.  Slight increase over baseline  mucus, + productive cough,  No non-productive cough,  No coughing up of blood.  No change in color of mucus.  No wheezing.  No chest wall deformity, secretions are thick  Skin: no rash or lesions.  GU: no dysuria, change in color of urine, no urgency or frequency.  No flank pain, no hematuria   MS:  No joint pain or swelling.  No decreased range of motion.  No back pain.  Psych:  No change  in  mood or affect. No depression or anxiety.  No memory loss.   Vital Signs BP 130/66   Pulse 76   Temp (!) 97.4 F (36.3 C) (Oral)   Ht 6\' 1"  (1.854 m)   Wt 200 lb (90.7 kg)   SpO2 99%   BMI 26.39 kg/m    Physical Exam:  General- No distress,  A&Ox3, pleasant ENT: No sinus tenderness, TM clear, pale nasal mucosa, no oral exudate,no post nasal drip, no LAN Cardiac: S1, S2, regular rate and rhythm, no murmur Chest: No wheeze/ rales/ dullness; no accessory muscle use, no nasal flaring, no sternal retractions Abd.: Soft Non-tender, ND, BS +, Body mass index is 26.39 kg/m. Ext: No clubbing cyanosis, edema Neuro:  normal strength, MAE x 4, A&O x 3 Skin: No rashes, warm and dry, no lesions Psych: normal mood and behavior   Assessment/Plan COPD Bronchitis Flare CXR with no acute changes 80% better after z pack Plan Continue using Flovent daily as you have been doing. Rinse mouth after use Use your albuterol as needed for breakthrough shortness of breath or wheezing. Prednisone taper; 10 mg tablets: 4 tabs x 2 days, 3 tabs x 2 days, 2 tabs x 2 days 1 tab x 2 days then stop. Continue using the plain Mucinex with a full glass of water  1200 mg once daily. Use Flutter valve 3-4 times daily when you have chest congestion.  Referral to Pulmonary rehab ( Under Dr. Ander [name])  Follow up with [facility] or Dr. Ander [name] in 3 months or sooner as needed. Please contact office for sooner follow up if symptoms do not improve or worsen or seek emergency care     Magdalen Spatz, NP 06/16/2020  12:31 PM

## 2020-06-17 MED ORDER — LORAZEPAM 0.5 MG PO TABS
0.5000 mg | ORAL_TABLET | Freq: Every day | ORAL | 0 refills | Status: DC
Start: 2020-06-17 — End: 2020-06-25

## 2020-06-17 NOTE — Telephone Encounter (Signed)
Needs appt to f/u on anxiety medication, in 20-30 days.

## 2020-06-17 NOTE — Telephone Encounter (Signed)
Please contact patient to have him set up appt. Thank you! 

## 2020-06-17 NOTE — Telephone Encounter (Signed)
Med Check appt made for 02/09

## 2020-06-17 NOTE — Telephone Encounter (Signed)
Sent mychart message

## 2020-06-25 ENCOUNTER — Ambulatory Visit (INDEPENDENT_AMBULATORY_CARE_PROVIDER_SITE_OTHER): Payer: Medicare Other | Admitting: Family Medicine

## 2020-06-25 ENCOUNTER — Encounter: Payer: Self-pay | Admitting: Family Medicine

## 2020-06-25 ENCOUNTER — Other Ambulatory Visit: Payer: Self-pay

## 2020-06-25 VITALS — BP 124/78 | HR 75 | Temp 97.7°F | Ht 73.0 in | Wt 200.4 lb

## 2020-06-25 DIAGNOSIS — G4700 Insomnia, unspecified: Secondary | ICD-10-CM

## 2020-06-25 DIAGNOSIS — I2510 Atherosclerotic heart disease of native coronary artery without angina pectoris: Secondary | ICD-10-CM

## 2020-06-25 DIAGNOSIS — E782 Mixed hyperlipidemia: Secondary | ICD-10-CM | POA: Diagnosis not present

## 2020-06-25 DIAGNOSIS — I10 Essential (primary) hypertension: Secondary | ICD-10-CM

## 2020-06-25 MED ORDER — LOVASTATIN 40 MG PO TABS
40.0000 mg | ORAL_TABLET | Freq: Every day | ORAL | 1 refills | Status: DC
Start: 2020-06-25 — End: 2020-10-23

## 2020-06-25 MED ORDER — LOSARTAN POTASSIUM 25 MG PO TABS
25.0000 mg | ORAL_TABLET | Freq: Every day | ORAL | 1 refills | Status: DC
Start: 2020-06-25 — End: 2020-10-23

## 2020-06-25 MED ORDER — LORAZEPAM 0.5 MG PO TABS
0.5000 mg | ORAL_TABLET | Freq: Every day | ORAL | 3 refills | Status: DC
Start: 2020-06-25 — End: 2020-10-23

## 2020-06-25 NOTE — Progress Notes (Signed)
Patient ID: [name] [name], male    DOB: [birth year], 80 y.o.   MRN: [id number]   Chief Complaint  Patient presents with  . Hypertension  . Insomnia   Subjective:    HPI Pt here for medication follow up for htn and anxiety/insomnia. Pt states blood pressure has been good; checks blood pressure at home once every once in a while. No issues.   Insomnia-  Pt taking 0.61m ativan at night. Has been taking ativan since 2019.  Was taking ativan 126mand taking 1/2 tab qhs.  Pt doing well and taking at night. Helping with sleep.  Not groggy in am. No issues with memory or falls.  Using inhalers regularly. Has albuterol and neb.  And flovent. Going to do pulm rehab.  Seeing Dr. OlInda Coke[facility]. Has h/o copd.  [place] [facility] H/o pancreatitis.  There was no reason found for the attack of pancreas.   Wife died pancreatic cancer.  Pt never a drinker. Pt stating lipase 5,000 range in 6/21.  No h/o elevated cholesterol.  Pt on lovastatin.  Had ankle swelling from hernia repair. But resolved.    Medical History ErRajohnas a past medical history of [place] [place] [facility], Anemia, Arthritis, Asthma, BCC (basal cell carcinoma) (08/18/1989), BCC (basal cell carcinoma) (01/31/1992), BCC (basal cell carcinoma) (11/22/2001), BCC (basal cell carcinoma) (10/09/2003), BCC (basal cell carcinoma) (08/15/2008), BPH (benign prostatic hyperplasia), CAD (coronary artery disease), [place] COPD (chronic obstructive [place] disease) ([facility] Dysrhythmia, GERD (gastroesophageal reflux disease), Glaucoma, HOH (hard of hearing), Hypercholesterolemia, Hypertension, Impaired fasting glucose, Low back pain, Mel[place]), [place]), SCC (squamous cell carcinoma) (07/03/2014), SCC (squamous cell carcinoma) (07/03/2014), SCC (squamous cell carcinoma) (07/20/2017), SCC (squamous cell carcinoma) (01/10/2019), SCC  (squamous cell carcinoma) (11/22/2001), SCC (squamous cell carcinoma) (10/09/2003), SCC (squamous cell carcinoma) (03/30/2004), SCC (squamous cell carcinoma) (03/08/2005), SCC (squamous cell carcinoma) (06/08/2006), SCC (squamous cell carcinoma) (05/05/2010), SCC (squamous cell carcinoma) (09/13/2013), and Thrush.   Outpatient Encounter Medications as of 06/25/2020  Medication Sig  . albuterol (PROAIR HFA) 108 (90 Base) MCG/ACT inhaler INHALE 2 PUFFS INTO THE LUNGS EVERY SIX HOURS AS NEEDED FOR WHEEZING.  . Marland Kitchenlbuterol (PROVENTIL) (2.5 MG/3ML) 0.083% nebulizer solution INHALE 1 VIAL VIA NEBULIZER FOUR TIMES DAILY (Patient taking differently: Take 2.5 mg by nebulization every 6 (six) hours as needed for wheezing or shortness of breath.)  . albuterol (VENTOLIN HFA) 108 (90 Base) MCG/ACT inhaler 2 puffs as needed  . alfuzosin (UROXATRAL) 10 MG 24 hr tablet Take 1 tablet (10 mg total) by mouth daily with breakfast.  . cetirizine (ZYRTEC) 10 MG tablet Take 10 mg by mouth daily.  . dorzolamide-timolol (COSOPT) 22.3-6.8 MG/ML ophthalmic solution Place 1 drop into both eyes 2 (two) times daily.   . Marland Kitchensomeprazole (NEXIUM) 20 MG capsule Take 20 mg by mouth daily at 12 noon.  . Marland Kitchensomeprazole (NEXIUM) 40 MG capsule 2 capsules  . fluticasone (FLOVENT HFA) 220 MCG/ACT inhaler Inhale 2 puffs into the lungs daily.   . hydroxypropyl methylcellulose / hypromellose (ISOPTO TEARS / GONIOVISC) 2.5 % ophthalmic solution Place 1 drop into both eyes 3 (three) times daily as needed for dry eyes.  . nitroGLYCERIN (NITROSTAT) 0.4 MG SL tablet Place 1 tablet (0.4 mg total) under the tongue every 5 (five) minutes as needed for chest pain.  . pseudoephedrine-guaifenesin (MUCINEX D) 60-600 MG 12 hr tablet Take 1 tablet by mouth 2 (two)  times daily as needed for congestion.  . sodium chloride (OCEAN) 0.65 % SOLN nasal spray Place 1 spray into both nostrils as needed for congestion.  . [DISCONTINUED] LORazepam (ATIVAN) 0.5 MG tablet Take  1 tablet (0.5 mg total) by mouth at bedtime.  . [DISCONTINUED] losartan (COZAAR) 25 MG tablet TAKE ONE TABLET BY MOUTH ONCE DAILY.  . [DISCONTINUED] lovastatin (MEVACOR) 40 MG tablet TAKE 1 TABLET BY MOUTH DAILY.  Marland Kitchen LORazepam (ATIVAN) 0.5 MG tablet Take 1 tablet (0.5 mg total) by mouth at bedtime.  Marland Kitchen losartan (COZAAR) 25 MG tablet Take 1 tablet (25 mg total) by mouth daily.  Marland Kitchen lovastatin (MEVACOR) 40 MG tablet Take 1 tablet (40 mg total) by mouth daily.  . [DISCONTINUED] azithromycin (ZITHROMAX) 250 MG tablet Take 1 tablet (250 mg total) by mouth daily. Take 2 tablets today, then one tablet daily x 4 days. (Patient not taking: No sig reported)  . [DISCONTINUED] predniSONE (DELTASONE) 10 MG tablet Take 4 tabs x 2 days, 3 tabs x 2 days, 2 tabs x 2 days, 1 tab x 2 days then stop   No facility-administered encounter medications on file as of 06/25/2020.     Review of Systems  Constitutional: Negative for chills and fever.  HENT: Negative for congestion, rhinorrhea and sore throat.   Respiratory: Negative for cough, shortness of breath and wheezing.   Cardiovascular: Negative for chest pain and leg swelling.  Gastrointestinal: Negative for abdominal pain, diarrhea, nausea and vomiting.  Genitourinary: Negative for dysuria and frequency.  Skin: Negative for rash.  Neurological: Negative for dizziness, weakness and headaches.  Psychiatric/Behavioral: Positive for sleep disturbance (controlled with meds). Negative for dysphoric mood, self-injury and suicidal ideas. The patient is not nervous/anxious.      Vitals BP 124/78   Pulse 75   Temp 97.7 F (36.5 C)   Ht 6' 1"  (1.854 m)   Wt 200 lb 6.4 oz (90.9 kg)   SpO2 100%   BMI 26.44 kg/m   Objective:   Physical Exam Vitals and nursing note reviewed.  Constitutional:      General: He is not in acute distress.    Appearance: Normal appearance. He is not ill-appearing.  HENT:     Head: Normocephalic.     Nose: Nose normal. No congestion.      Mouth/Throat:     Mouth: Mucous membranes are moist.     Pharynx: No oropharyngeal exudate.  Eyes:     Extraocular Movements: Extraocular movements intact.     Conjunctiva/sclera: Conjunctivae normal.     Pupils: Pupils are equal, round, and reactive to light.  Cardiovascular:     Rate and Rhythm: Normal rate and regular rhythm.     Pulses: Normal pulses.     Heart sounds: Normal heart sounds. No murmur heard.   Pulmonary:     Effort: Pulmonary effort is normal.     Breath sounds: Normal breath sounds. No wheezing, rhonchi or rales.  Musculoskeletal:        General: Normal range of motion.     Right lower leg: No edema.     Left lower leg: No edema.  Skin:    General: Skin is warm and dry.     Findings: No rash.  Neurological:     General: No focal deficit present.     Mental Status: He is alert and oriented to person, place, and time.     Cranial Nerves: No cranial nerve deficit.  Psychiatric:  Mood and Affect: Mood normal.        Behavior: Behavior normal.        Thought Content: Thought content normal.        Judgment: Judgment normal.      Assessment and Plan   1. Insomnia, unspecified type  2. Atherosclerosis of native coronary artery of native heart without angina pectoris - CBC - CMP14+EGFR - Lipid panel  3. Primary hypertension - CBC - CMP14+EGFR - Lipid panel  4. Mixed hyperlipidemia    htn- stable. Cont meds. Ordered labs.   hld- stable. Cont meds.  Labs ordered.  Insomnia- Refill every 30mo For his lorazepam 0.533m  Doing well.  No side effects and working well for his insomnia.  Reviewed safety with taking benzodiazepines long term. Pt doing well, no new concerns. Pt wanting to stay on this medication, reviewed concern of early memory loss, confusion, or falls.  Pt voiced understanding and wanting to continue with this medication.  F/u [id number] prn.

## 2020-07-04 ENCOUNTER — Other Ambulatory Visit: Payer: Self-pay | Admitting: Family Medicine

## 2020-07-07 ENCOUNTER — Telehealth: Payer: Self-pay | Admitting: Family Medicine

## 2020-07-07 MED ORDER — ALBUTEROL SULFATE (2.5 MG/3ML) 0.083% IN NEBU: 2.5000 mg | INHALATION_SOLUTION | Freq: Four times a day (QID) | RESPIRATORY_TRACT | 5 refills | Status: DC | PRN

## 2020-07-07 NOTE — Telephone Encounter (Signed)
Patient is requesting refill on albuterol nebulizer solution called into [place]. Patient states spoke with doctor on this on 2/9 but wasn't sent in. He is needing now completely out. Please advise

## 2020-07-07 NOTE — Telephone Encounter (Signed)
Prescription sent electronically to pharmacy. Patient notified. 

## 2020-07-07 NOTE — Telephone Encounter (Signed)
May have 5 refills

## 2020-07-14 ENCOUNTER — Telehealth: Payer: Self-pay | Admitting: Pulmonary Disease

## 2020-07-14 MED ORDER — PREDNISONE 10 MG PO TABS: 20.0000 mg | ORAL_TABLET | Freq: Every day | ORAL | 0 refills | Status: DC

## 2020-07-14 MED ORDER — AZITHROMYCIN 250 MG PO TABS: 250.0000 mg | ORAL_TABLET | Freq: Every day | ORAL | 0 refills | Status: DC

## 2020-07-14 NOTE — Telephone Encounter (Signed)
I have called the pt and he is aware of meds that have been sen tto the pharmacy per AO.  Pt is aware to call back if he is not getting better.  Nothing further is needed.

## 2020-07-14 NOTE — Telephone Encounter (Signed)
Called in a course of azithromycin and prednisone for him  Call back to the office if not feeling better May need to come in and get a chest x-ray

## 2020-07-14 NOTE — Telephone Encounter (Signed)
Spoke with the pt  He is c/o increased cough over the past 7-10 days  He is coughing up large amounts of dark yellow sputum  He states he has felt more SOB and has been using his rescue inhaler 4 x daily on average for the past wk  He started to have chills today, no fever  He took at home covid test and this was negative today  He has been fully vaccinated against covid including booster  He states that the right side of his chest "feels irritated"  He has been taking mucinex, flutter device, flovent  Please advise thanks!

## 2020-07-16 ENCOUNTER — Ambulatory Visit: Payer: Medicare Other | Admitting: Family Medicine

## 2020-07-17 DIAGNOSIS — K8500 Idiopathic acute pancreatitis without necrosis or infection: Secondary | ICD-10-CM | POA: Diagnosis not present

## 2020-07-17 DIAGNOSIS — R140 Abdominal distension (gaseous): Secondary | ICD-10-CM | POA: Diagnosis not present

## 2020-07-17 DIAGNOSIS — Z86010 Personal history of colonic polyps: Secondary | ICD-10-CM | POA: Diagnosis not present

## 2020-07-18 ENCOUNTER — Encounter (HOSPITAL_COMMUNITY): Payer: Medicare Other

## 2020-08-08 ENCOUNTER — Telehealth: Payer: Self-pay | Admitting: Pulmonary Disease

## 2020-08-08 ENCOUNTER — Encounter (HOSPITAL_COMMUNITY)
Admission: RE | Admit: 2020-08-08 | Discharge: 2020-08-08 | Disposition: A | Discharge status: Home / Self Care | Payer: Medicare Other | Source: Ambulatory Visit | Attending: Pulmonary Disease | Admitting: Pulmonary Disease

## 2020-08-08 ENCOUNTER — Encounter (HOSPITAL_COMMUNITY): Payer: Self-pay

## 2020-08-08 ENCOUNTER — Other Ambulatory Visit: Payer: Self-pay

## 2020-08-08 VITALS — BP 112/74 | HR 72 | Ht 73.0 in | Wt 199.7 lb

## 2020-08-08 DIAGNOSIS — J441 Chronic obstructive pulmonary disease with (acute) exacerbation: Secondary | ICD-10-CM

## 2020-08-08 MED ORDER — FLUTICASONE PROPIONATE HFA 220 MCG/ACT IN AERO
2.0000 | INHALATION_SPRAY | Freq: Every day | RESPIRATORY_TRACT | 5 refills | Status: DC
Start: 2020-08-08 — End: 2021-04-01

## 2020-08-08 NOTE — Telephone Encounter (Signed)
Rx for Flovent HFA has been sent to preferred pharmacy for pt. Attempted to call pt to let him know that this had been done but unable to reach. Left pt a detailed message on machine letting him know that the inhaler was sent in for him. Nothing further needed.

## 2020-08-08 NOTE — Progress Notes (Signed)
Pulmonary Individual Treatment Plan  Patient Details  Name: [name] [name] MRN: [id number] Date of Birth: [birth year] Referring Provider:   Flowsheet Row PULMONARY REHAB OTHER RESP ORIENTATION from 08/08/2020 in [place]  Referring Provider Dr. Ander Slade      Initial Encounter Date:  Flowsheet Row PULMONARY REHAB OTHER RESP ORIENTATION from 08/08/2020 in [place]  Date 08/08/20      Visit Diagnosis: COPD with acute exacerbation ([place])  Patient's Home Medications on Admission:   Current Outpatient Medications:  .  albuterol (PROAIR HFA) 108 (90 Base) MCG/ACT inhaler, INHALE 2 PUFFS INTO THE LUNGS EVERY SIX HOURS AS NEEDED FOR WHEEZING., Disp: 8.5 g, Rfl: 2 .  albuterol (PROVENTIL) (2.5 MG/3ML) 0.083% nebulizer solution, Take 3 mLs (2.5 mg total) by nebulization every 6 (six) hours as needed for wheezing or shortness of breath., Disp: 100 mL, Rfl: 2 .  albuterol (VENTOLIN HFA) 108 (90 Base) MCG/ACT inhaler, 2 puffs as needed, Disp: , Rfl:  .  alfuzosin (UROXATRAL) 10 MG 24 hr tablet, Take 1 tablet (10 mg total) by mouth daily with breakfast., Disp: 90 tablet, Rfl: 3 .  cetirizine (ZYRTEC) 10 MG tablet, Take 10 mg by mouth daily., Disp: , Rfl:  .  dorzolamide-timolol (COSOPT) 22.3-6.8 MG/ML ophthalmic solution, Place 1 drop into both eyes 2 (two) times daily. , Disp: , Rfl:  .  esomeprazole (NEXIUM) 20 MG capsule, Take 20 mg by mouth daily at 12 noon., Disp: , Rfl:  .  esomeprazole (NEXIUM) 40 MG capsule, Take 40 mg by mouth daily., Disp: , Rfl:  .  fluticasone (FLOVENT HFA) 220 MCG/ACT inhaler, Inhale 2 puffs into the lungs daily., Disp: 12 g, Rfl: 5 .  hydroxypropyl methylcellulose / hypromellose (ISOPTO TEARS / GONIOVISC) 2.5 % ophthalmic solution, Place 1 drop into both eyes 3 (three) times daily as needed for dry eyes., Disp: , Rfl:  .  LORazepam (ATIVAN) 0.5 MG tablet, Take 1 tablet (0.5 mg total) by mouth at bedtime., Disp: 30 tablet, Rfl:  3 .  losartan (COZAAR) 25 MG tablet, Take 1 tablet (25 mg total) by mouth daily., Disp: 90 tablet, Rfl: 1 .  lovastatin (MEVACOR) 40 MG tablet, Take 1 tablet (40 mg total) by mouth daily., Disp: 90 tablet, Rfl: 1 .  nitroGLYCERIN (NITROSTAT) 0.4 MG SL tablet, Place 1 tablet (0.4 mg total) under the tongue every 5 (five) minutes as needed for chest pain., Disp: 25 tablet, Rfl: 5 .  pseudoephedrine-guaifenesin (MUCINEX D) 60-600 MG 12 hr tablet, Take 1 tablet by mouth 2 (two) times daily as needed for congestion., Disp: , Rfl:  .  sodium chloride (OCEAN) 0.65 % SOLN nasal spray, Place 1 spray into both nostrils as needed for congestion., Disp: , Rfl:   Past Medical History: Past Medical History:  Diagnosis Date  . AAA (abdominal aortic aneurysm) ([place])    needs yearly ultrasound  . Allergy   . Anemia   . Arthritis   . Asthma   . BCC (basal cell carcinoma) 08/18/1989   left shoulder blad, upper right arm, left arm beyond elbow, c&d  . BCC (basal cell carcinoma) 01/31/1992   Posterior neck, curetx3, 103fu  . BCC (basal cell carcinoma) 11/22/2001   mid forehead, cx3, excision, right forearm, cx3, 23fu  . BCC (basal cell carcinoma) 10/09/2003   mid forehead, MOHs  . BCC (basal cell carcinoma) 08/15/2008   upper left back, biopsy  . BPH (benign prostatic hyperplasia)   . CAD (coronary artery disease)   .  Cancer ([place])    skin cancer  . COPD (chronic obstructive pulmonary disease) ([place])   . Dysrhythmia    pt. states it can be fast at times  . GERD (gastroesophageal reflux disease)   . Glaucoma   . HOH (hard of hearing)   . Hypercholesterolemia   . Hypertension   . Impaired fasting glucose   . Low back pain   . Melanoma in situ ([place]) 10/09/2003   left chin, MOHs  . MI (myocardial infarction) ([place]) 1999  . SCC (squamous cell carcinoma) 07/03/2014   in situ, behind left ear, cx3, cautery, 5fu  . SCC (squamous cell carcinoma) 07/03/2014   well diff, left forearm, biopsy, cx1, cautery  .  SCC (squamous cell carcinoma) 07/20/2017   in situ, left upper arm, cx3, 69fu  . SCC (squamous cell carcinoma) 01/10/2019   in situ, left post shoulder, cx3, 61fu  . SCC (squamous cell carcinoma) 11/22/2001   left forearm distal, left forearm, cx3, 21fu  . SCC (squamous cell carcinoma) 10/09/2003   [place], left ear post, clear per st, right cheek clear  . SCC (squamous cell carcinoma) 03/30/2004   in situ, left upper arm, cx3, 59fu  . SCC (squamous cell carcinoma) 03/08/2005   in situ, right cheek, mid upper forehead, cx3, 16fu  . SCC (squamous cell carcinoma) 06/08/2006   in situ, left shoulder, cx3, 87fu  . SCC (squamous cell carcinoma) 05/05/2010   right inner wrist, biopsy  . SCC (squamous cell carcinoma) 09/13/2013   in situ, right crown scalp, front scalp, biopsy  . Thrush     Tobacco Use: Social History   Tobacco Use  Smoking Status Former Smoker  . Packs/day: 1.00  . Years: 35.00  . Pack years: 35.00  . Types: Cigarettes  . Quit date: 03/28/2003  . Years since quitting: 17.3  Smokeless Tobacco Never Used    Labs: Recent Review Flowsheet Data    Labs for ITP Cardiac and Pulmonary Rehab Latest Ref Rng & Units 07/25/2015 06/23/2017 02/14/2019 09/05/2019 11/12/2019   Cholestrol 0 - 200 mg/dL 158 122 117 124 107   LDLCALC 0 - 99 mg/dL 92 67 61 63 61   HDL >40 mg/dL 44 43 38(L) 46 35(L)   Trlycerides <150 mg/dL 111 61 93 71 54      Capillary Blood Glucose: Lab Results  Component Value Date   GLUCAP 97 09/17/2016   GLUCAP 145 (H) 09/16/2016     Pulmonary Assessment Scores:  Pulmonary Assessment Scores    Row Name 08/08/20 1254         ADL UCSD   SOB Score total 22           CAT Score   CAT Score 19           mMRC Score   mMRC Score 2           UCSD: Self-administered rating of dyspnea associated with activities of daily living (ADLs) 6-point scale (0 = "not at all" to 5 = "maximal or unable to do because of breathlessness")  Scoring Scores range from 0  to 120.  Minimally important difference is 5 units  CAT: CAT can identify the health impairment of COPD patients and is better correlated with disease progression.  CAT has a scoring range of zero to 40. The CAT score is classified into four groups of low (less than 10), medium (10 - 20), high (21-30) and very high (31-40) based on the impact level of disease on  health status. A CAT score over 10 suggests significant symptoms.  A worsening CAT score could be explained by an exacerbation, poor medication adherence, poor inhaler technique, or progression of COPD or comorbid conditions.  CAT MCID is 2 points  mMRC: mMRC (Modified Medical Research Council) Dyspnea Scale is used to assess the degree of baseline functional disability in patients of respiratory disease due to dyspnea. No minimal important difference is established. A decrease in score of 1 point or greater is considered a positive change.   Pulmonary Function Assessment:   Exercise Target Goals: Exercise Program Goal: Individual exercise prescription set using results from initial 6 min walk test and THRR while considering  patient's activity barriers and safety.   Exercise Prescription Goal: Initial exercise prescription builds to 30-45 minutes a day of aerobic activity, 2-3 days per week.  Home exercise guidelines will be given to patient during program as part of exercise prescription that the participant will acknowledge.  Activity Barriers & Risk Stratification:  Activity Barriers & Cardiac Risk Stratification - 08/08/20 1253      Activity Barriers & Cardiac Risk Stratification   Activity Barriers Arthritis;Back Problems;Neck/Spine Problems;Deconditioning;Muscular Weakness;Shortness of Breath    Cardiac Risk Stratification High           6 Minute Walk:  6 Minute Walk    Row Name 08/08/20 1428         6 Minute Walk   Phase Initial     Distance 1200 feet     Walk Time 6 minutes     # of Rest Breaks 0     MPH  2.27     METS 2.43     RPE 11     Perceived Dyspnea  13     VO2 Peak 8.51     Resting HR 72 bpm     Resting BP 112/74     Resting Oxygen Saturation  96 %     Exercise Oxygen Saturation  during 6 min walk 91 %     Max Ex. HR 99 bpm     Max Ex. BP 128/64     2 Minute Post BP 106/70           Interval HR   1 Minute HR 96     2 Minute HR 99     3 Minute HR 97     4 Minute HR 93     5 Minute HR 96     6 Minute HR 96     2 Minute Post HR 83     Interval Heart Rate? Yes           Interval Oxygen   Interval Oxygen? Yes     Baseline Oxygen Saturation % 96 %     1 Minute Oxygen Saturation % 93 %     1 Minute Liters of Oxygen 0 L     2 Minute Oxygen Saturation % 91 %     2 Minute Liters of Oxygen 0 L     3 Minute Oxygen Saturation % 92 %     3 Minute Liters of Oxygen 0 L     4 Minute Oxygen Saturation % 91 %     4 Minute Liters of Oxygen 0 L     5 Minute Oxygen Saturation % 92 %     5 Minute Liters of Oxygen 0 L     6 Minute Oxygen Saturation % 92 %     6 Minute Liters  of Oxygen 0 L     2 Minute Post Oxygen Saturation % 96 %     2 Minute Post Liters of Oxygen 0 L            Oxygen Initial Assessment:  Oxygen Initial Assessment - 08/08/20 1433      Initial 6 min Walk   Oxygen Used None      Program Oxygen Prescription   Program Oxygen Prescription None      Intervention   Short Term Goals To learn and exhibit compliance with exercise, home and travel O2 prescription;To learn and understand importance of monitoring SPO2 with pulse oximeter and demonstrate accurate use of the pulse oximeter.;To learn and understand importance of maintaining oxygen saturations>88%;To learn and demonstrate proper pursed lip breathing techniques or other breathing techniques.;To learn and demonstrate proper use of respiratory medications    Long  Term Goals Exhibits compliance with exercise, home and travel O2 prescription;Verbalizes importance of monitoring SPO2 with pulse oximeter and  return demonstration;Maintenance of O2 saturations>88%;Exhibits proper breathing techniques, such as pursed lip breathing or other method taught during program session;Compliance with respiratory medication           Oxygen Re-Evaluation:   Oxygen Discharge (Final Oxygen Re-Evaluation):   Initial Exercise Prescription:  Initial Exercise Prescription - 08/08/20 1400      Date of Initial Exercise RX and Referring Provider   Date 08/08/20    Referring Provider Dr. Ander Slade    Expected Discharge Date 12/11/20      NuStep   Level 1    SPM 80    Minutes 22      Arm Ergometer   Level 1    RPM 45    Minutes 17      Prescription Details   Frequency (times per week) 2    Duration Progress to 30 minutes of continuous aerobic without signs/symptoms of physical distress      Intensity   THRR 40-80% of Max Heartrate 57-114    Ratings of Perceived Exertion 11-13    Perceived Dyspnea 0-4      Resistance Training   Training Prescription Yes    Weight 3 lbs    Reps 10-15           Perform Capillary Blood Glucose checks as needed.  Exercise Prescription Changes:   Exercise Comments:   Exercise Goals and Review:  Exercise Goals    Row Name 08/08/20 1432             Exercise Goals   Increase Physical Activity Yes       Intervention Provide advice, education, support and counseling about physical activity/exercise needs.;Develop an individualized exercise prescription for aerobic and resistive training based on initial evaluation findings, risk stratification, comorbidities and participant's personal goals.       Expected Outcomes Short Term: Attend rehab on a regular basis to increase amount of physical activity.;Long Term: Add in home exercise to make exercise part of routine and to increase amount of physical activity.;Long Term: Exercising regularly at least 3-5 days a week.       Increase Strength and Stamina Yes       Intervention Provide advice, education, support  and counseling about physical activity/exercise needs.;Develop an individualized exercise prescription for aerobic and resistive training based on initial evaluation findings, risk stratification, comorbidities and participant's personal goals.       Expected Outcomes Short Term: Increase workloads from initial exercise prescription for resistance, speed, and METs.;Short Term: Perform resistance  training exercises routinely during rehab and add in resistance training at home;Long Term: Improve cardiorespiratory fitness, muscular endurance and strength as measured by increased METs and functional capacity (6MWT)       Able to understand and use rate of perceived exertion (RPE) scale Yes       Intervention Provide education and explanation on how to use RPE scale       Expected Outcomes Short Term: Able to use RPE daily in rehab to express subjective intensity level;Long Term:  Able to use RPE to guide intensity level when exercising independently       Able to understand and use Dyspnea scale Yes       Intervention Provide education and explanation on how to use Dyspnea scale       Expected Outcomes Short Term: Able to use Dyspnea scale daily in rehab to express subjective sense of shortness of breath during exertion;Long Term: Able to use Dyspnea scale to guide intensity level when exercising independently       Knowledge and understanding of Target Heart Rate Range (THRR) Yes       Intervention Provide education and explanation of THRR including how the numbers were predicted and where they are located for reference       Expected Outcomes Short Term: Able to state/look up THRR;Long Term: Able to use THRR to govern intensity when exercising independently;Short Term: Able to use daily as guideline for intensity in rehab       Understanding of Exercise Prescription Yes       Intervention Provide education, explanation, and written materials on patient's individual exercise prescription       Expected  Outcomes Short Term: Able to explain program exercise prescription;Long Term: Able to explain home exercise prescription to exercise independently              Exercise Goals Re-Evaluation :   Discharge Exercise Prescription (Final Exercise Prescription Changes):   Nutrition:  Target Goals: Understanding of nutrition guidelines, daily intake of sodium 1500mg , cholesterol 200mg , calories 30% from fat and 7% or less from saturated fats, daily to have 5 or more servings of fruits and vegetables.  Biometrics:  Pre Biometrics - 08/08/20 1432      Pre Biometrics   Height 6\' 1"  (1.854 m)    Weight 199 lb 11.8 oz (90.6 kg)    Waist Circumference 44 inches    Hip Circumference 41.5 inches    Waist to Hip Ratio 1.06 %    BMI (Calculated) 26.36    Triceps Skinfold 11 mm    % Body Fat 27.6 %    Grip Strength 30.2 kg    Flexibility 0 in    Single Leg Stand 1 seconds            Nutrition Therapy Plan and Nutrition Goals:   Nutrition Assessments:  Nutrition Assessments - 08/08/20 1256      MEDFICTS Scores   Post Score 84          MEDIFICTS Score Key:  ?70 Need to make dietary changes   40-70 Heart Healthy Diet  ? 40 Therapeutic Level Cholesterol Diet   Picture Your Plate Scores:  <79 Unhealthy dietary pattern with much room for improvement.  41-50 Dietary pattern unlikely to meet recommendations for good health and room for improvement.  51-60 More healthful dietary pattern, with some room for improvement.   >60 Healthy dietary pattern, although there may be some specific behaviors that could be improved.  Nutrition Goals Re-Evaluation:   Nutrition Goals Discharge (Final Nutrition Goals Re-Evaluation):   Psychosocial: Target Goals: Acknowledge presence or absence of significant depression and/or stress, maximize coping skills, provide positive support system. Participant is able to verbalize types and ability to use techniques and skills needed for  reducing stress and depression.  Initial Review & Psychosocial Screening:  Initial Psych Review & Screening - 08/08/20 1255      Initial Review   Current issues with Current Stress Concerns    Source of Stress Concerns Family    Comments Grieving the death of his wife.      Family Dynamics   Good Support System? Yes    Comments His two children and his grandchildren are his support system.      Barriers   Psychosocial barriers to participate in program The patient should benefit from training in stress management and relaxation.      Screening Interventions   Interventions Encouraged to exercise;Provide feedback about the scores to participant;To provide support and resources with identified psychosocial needs    Expected Outcomes Long Term Goal: Stressors or current issues are controlled or eliminated.;Short Term goal: Identification and review with participant of any Quality of Life or Depression concerns found by scoring the questionnaire.;Long Term goal: The participant improves quality of Life and PHQ9 Scores as seen by post scores and/or verbalization of changes           Quality of Life Scores:  Quality of Life - 08/08/20 1435      Quality of Life   Select Quality of Life      Quality of Life Scores   Health/Function Pre 9.34 %    Socioeconomic Pre 16.4 %    Psych/Spiritual Pre 15.08 %    Family Pre 23.63 %    GLOBAL Pre 13.44 %          Scores of 19 and below usually indicate a poorer quality of life in these areas.  A difference of  2-3 points is a clinically meaningful difference.  A difference of 2-3 points in the total score of the Quality of Life Index has been associated with significant improvement in overall quality of life, self-image, physical symptoms, and general health in studies assessing change in quality of life.   PHQ-9: Recent Review Flowsheet Data    Depression screen [facility] 2/9 08/08/2020 06/25/2020 02/13/2019   Decreased Interest 3  0 0   Down,  Depressed, Hopeless 0 0 0   PHQ - 2 Score 3 0 0   Altered sleeping 0 - -   Tired, decreased energy 3 - -   Change in appetite 0 - -   Feeling bad or failure about yourself  0 - -   Trouble concentrating 0 - -   Moving slowly or fidgety/restless 0 - -   Suicidal thoughts 0 - -   PHQ-9 Score 6 - -   Difficult doing work/chores Somewhat difficult - -     Interpretation of Total Score  Total Score Depression Severity:  1-4 = Minimal depression, 5-9 = Mild depression, 10-14 = Moderate depression, 15-19 = Moderately severe depression, 20-27 = Severe depression   Psychosocial Evaluation and Intervention:  Psychosocial Evaluation - 08/08/20 1438      Psychosocial Evaluation & Interventions   Interventions Stress management education;Relaxation education;Encouraged to exercise with the program and follow exercise prescription    Comments Pt has no barriers to participating in pulmonary rehab. He is still grieving the death of  his wife who died 3 years ago. They were married for 58 years. He reports that he no longer enjoys doing a lot of the things that he used to due to his sadness. He stated that he tried to attend some grief support groups at first, but he states he thought that they were not beneficial at all. He was defensive when talking about the subject of his wife's death and when I asked if he needed to talk to a councelor. He states that he will cope on his own, but I do think he would benefit from professional support. He reports that he has a good support system now with his 2 children and his grandchildren. A lot of his other problems stem from his wife's death. He reports that she did all of the cooking and he had never learned to cook, so now his diet has gotten unhealthy. Additionally, he states that he let himself go physically after her death. This has made the SOB from his COPD even worse. He scored a 6 on his PHQ-9 and a 13.44 on his QOL. His decreased QOL is associated with his  grieving and his health. He does have optimism that the pulmonary rehab program will better his health and help him breath better. He will benefit from stress management education, relaxation education, and overall social support from staff and other patients.    Expected Outcomes Through education and exercise, the patient's stressors will be reduced.    Continue Psychosocial Services  Follow up required by staff           Psychosocial Re-Evaluation:   Psychosocial Discharge (Final Psychosocial Re-Evaluation):    Education: Education Goals: Education classes will be provided on a weekly basis, covering required topics. Participant will state understanding/return demonstration of topics presented.  Learning Barriers/Preferences:  Learning Barriers/Preferences - 08/08/20 1304      Learning Barriers/Preferences   Learning Barriers Hearing    Learning Preferences Written Material;Audio;Skilled Demonstration           Education Topics: How Lungs Work and Diseases: - Discuss the anatomy of the lungs and diseases that can affect the lungs, such as COPD.   Exercise: -Discuss the importance of exercise, FITT principles of exercise, normal and abnormal responses to exercise, and how to exercise safely.   Environmental Irritants: -Discuss types of environmental irritants and how to limit exposure to environmental irritants.   Meds/Inhalers and oxygen: - Discuss respiratory medications, definition of an inhaler and oxygen, and the proper way to use an inhaler and oxygen.   Energy Saving Techniques: - Discuss methods to conserve energy and decrease shortness of breath when performing activities of daily living.    Bronchial Hygiene / Breathing Techniques: - Discuss breathing mechanics, pursed-lip breathing technique,  proper posture, effective ways to clear airways, and other functional breathing techniques   Cleaning Equipment: - Provides group verbal and written  instruction about the health risks of elevated stress, cause of high stress, and healthy ways to reduce stress.   Nutrition I: Fats: - Discuss the types of cholesterol, what cholesterol does to the body, and how cholesterol levels can be controlled.   Nutrition II: Labels: -Discuss the different components of food labels and how to read food labels.   Respiratory Infections: - Discuss the signs and symptoms of respiratory infections, ways to prevent respiratory infections, and the importance of seeking medical treatment when having a respiratory infection.   Stress I: Signs and Symptoms: - Discuss the causes of stress,  how stress may lead to anxiety and depression, and ways to limit stress.   Stress II: Relaxation: -Discuss relaxation techniques to limit stress.   Oxygen for Home/Travel: - Discuss how to prepare for travel when on oxygen and proper ways to transport and store oxygen to ensure safety.   Knowledge Questionnaire Score:  Knowledge Questionnaire Score - 08/08/20 1304      Knowledge Questionnaire Score   Pre Score 14/18           Core Components/Risk Factors/Patient Goals at Admission:  Personal Goals and Risk Factors at Admission - 08/08/20 1304      Core Components/Risk Factors/Patient Goals on Admission    Weight Management Yes;Weight Loss    Intervention Weight Management: Develop a combined nutrition and exercise program designed to reach desired caloric intake, while maintaining appropriate intake of nutrient and fiber, sodium and fats, and appropriate energy expenditure required for the weight goal.;Weight Management: Provide education and appropriate resources to help participant work on and attain dietary goals.;Weight Management/Obesity: Establish reasonable short term and long term weight goals.;Obesity: Provide education and appropriate resources to help participant work on and attain dietary goals.    Expected Outcomes Short Term: Continue to assess  and modify interventions until short term weight is achieved;Long Term: Adherence to nutrition and physical activity/exercise program aimed toward attainment of established weight goal;Weight Loss: Understanding of general recommendations for a balanced deficit meal plan, which promotes 1-2 lb weight loss per week and includes a negative energy balance of [phone] kcal/d;Understanding recommendations for meals to include 15-35% energy as protein, 25-35% energy from fat, 35-60% energy from carbohydrates, less than 200mg  of dietary cholesterol, 20-35 gm of total fiber daily;Understanding of distribution of calorie intake throughout the day with the consumption of 4-5 meals/snacks    Improve shortness of breath with ADL's Yes    Intervention Provide education, individualized exercise plan and daily activity instruction to help decrease symptoms of SOB with activities of daily living.    Expected Outcomes Short Term: Improve cardiorespiratory fitness to achieve a reduction of symptoms when performing ADLs;Long Term: Be able to perform more ADLs without symptoms or delay the onset of symptoms    Stress Yes    Intervention Offer individual and/or small group education and counseling on adjustment to heart disease, stress management and health-related lifestyle change. Teach and support self-help strategies.;Refer participants experiencing significant psychosocial distress to appropriate mental health specialists for further evaluation and treatment. When possible, include family members and significant others in education/counseling sessions.    Expected Outcomes Long Term: Emotional wellbeing is indicated by absence of clinically significant psychosocial distress or social isolation.;Short Term: Participant demonstrates changes in health-related behavior, relaxation and other stress management skills, ability to obtain effective social support, and compliance with psychotropic medications if prescribed.            Core Components/Risk Factors/Patient Goals Review:    Core Components/Risk Factors/Patient Goals at Discharge (Final Review):    ITP Comments:   Comments: Patient arrived for 1st visit/orientation/education at 1230. Patient was referred to PR by Dr. Ander Slade due to COPD with acute exacerbation (J44.1). During orientation advised patient on arrival and appointment times what to wear, what to do before, during and after exercise. Reviewed attendance and class policy.  Pt is scheduled to return Pulmonary Rehab on 05/14/2021 at 1330. Pt was advised to come to class 15 minutes before class starts.  Discussed RPE/Dpysnea scales. Patient participated in warm up stretches. Patient was able to complete 6  minute walk test. Patient was measured for the equipment. Discussed equipment safety with patient. Took patient pre-anthropometric measurements. Patient finished visit at 1405.

## 2020-08-08 NOTE — Progress Notes (Signed)
Cardiac/Pulmonary Rehab Medication Review by a Pharmacist  Does the patient  feel that his/her medications are working for him/her?  yes  Has the patient been experiencing any side effects to the medications prescribed?  no  Does the patient measure his/her own blood pressure or blood glucose at home?  yes   Does the patient have any problems obtaining medications due to transportation or finances?   no  Understanding of regimen: good Understanding of indications: good Potential of compliance: good  Questions asked to Determine Patient Understanding of Medication Regimen:  1. What is the name of the medication?  2. What is the medication used for?  3. When should it be taken?  4. How much should be taken?  5. How will you take it?  6. What side effects should you report?  Understanding Defined as: Excellent: All questions above are correct Good: Questions 1-4 are correct Fair: Questions 1-2 are correct  Poor: 1 or none of the above questions are correct   Pharmacist comments: Overall, patient has good understanding of medications and regimen. Discussed with patient about LABA/LAMA but patient also has glaucoma so he plans on discussing with pulmonologist.   [name] [name], PharmD Clinical Pharmacist 08/08/2020 1:57 PM

## 2020-08-12 ENCOUNTER — Encounter (HOSPITAL_COMMUNITY)
Admission: RE | Admit: 2020-08-12 | Discharge: 2020-08-12 | Disposition: A | Discharge status: Home / Self Care | Payer: Medicare Other | Source: Ambulatory Visit | Attending: Pulmonary Disease | Admitting: Pulmonary Disease

## 2020-08-12 ENCOUNTER — Other Ambulatory Visit: Payer: Self-pay

## 2020-08-12 DIAGNOSIS — J441 Chronic obstructive pulmonary disease with (acute) exacerbation: Secondary | ICD-10-CM | POA: Diagnosis not present

## 2020-08-12 NOTE — Progress Notes (Signed)
Daily Session Note  Patient Details  Name: [name] [name] MRN: [id number] Date of Birth: [birth year] Referring Provider:   Flowsheet Row PULMONARY REHAB OTHER RESP ORIENTATION from 08/08/2020 in [place]  Referring Provider Dr. Ander Slade      Encounter Date: 08/12/2020  Check In:  Session Check In - 08/12/20 1330      Check-In   Supervising physician immediately available to respond to emergencies [facility] MD immediately available    Physician(s) Dr. Domenic Polite    Location [facility]    Staff Present Cathren Harsh, MS, Exercise Physiologist;Dalton Kris Mouton, MS, ACSM-CEP, Exercise Physiologist;Phyllis Billingsley, RN    Virtual Visit No    Medication changes reported     No    Fall or balance concerns reported    No    Tobacco Cessation No Change    Warm-up and Cool-down Performed as group-led instruction    Resistance Training Performed Yes    VAD Patient? No    PAD/SET Patient? No      Pain Assessment   Currently in Pain? No/denies    Multiple Pain Sites No           Capillary Blood Glucose: No results found for this or any previous visit (from the past 24 hour(s)).    Social History   Tobacco Use  Smoking Status Former Smoker  . Packs/day: 1.00  . Years: 35.00  . Pack years: 35.00  . Types: Cigarettes  . Quit date: 03/28/2003  . Years since quitting: 17.3  Smokeless Tobacco Never Used    Goals Met:  Independence with exercise equipment Exercise tolerated well No report of cardiac concerns or symptoms Strength training completed today  Goals Unmet:  Not Applicable  Comments: check out 1430   Dr. Kathie Dike is Medical Director for [facility].

## 2020-08-14 ENCOUNTER — Encounter (HOSPITAL_COMMUNITY)
Admission: RE | Admit: 2020-08-14 | Discharge: 2020-08-14 | Disposition: A | Discharge status: Home / Self Care | Payer: Medicare Other | Source: Ambulatory Visit | Attending: Pulmonary Disease | Admitting: Pulmonary Disease

## 2020-08-14 ENCOUNTER — Other Ambulatory Visit: Payer: Self-pay

## 2020-08-14 DIAGNOSIS — J441 Chronic obstructive pulmonary disease with (acute) exacerbation: Secondary | ICD-10-CM

## 2020-08-14 NOTE — Progress Notes (Signed)
Daily Session Note  Patient Details  Name: [name] [name] MRN: [id number] Date of Birth: 1940/12/26 Referring Provider:   [place] from 08/08/2020 in [place]  Referring Provider Dr. Ander Slade      Encounter Date: 08/14/2020  Check In:  Session Check In - 08/14/20 1330      Check-In   Supervising physician immediately available to respond to emergencies [facility] MD immediately available    Physician(s) Dr. Domenic Polite    Location [facility]    Staff Present Aundra Dubin, RN, BSN;Madison Audria Nine, MS, Exercise Physiologist    Virtual Visit No    Medication changes reported     No    Fall or balance concerns reported    No    Tobacco Cessation No Change    Warm-up and Cool-down Performed as group-led instruction    Resistance Training Performed Yes    VAD Patient? No    PAD/SET Patient? No      Pain Assessment   Currently in Pain? Yes    Pain Score 5     Pain Location Arm    Pain Orientation Upper    Pain Descriptors / Indicators Aching    Pain Type Acute pain    Pain Onset In the past 7 days    Pain Frequency Intermittent    Multiple Pain Sites No           Capillary Blood Glucose: No results found for this or any previous visit (from the past 24 hour(s)).    Social History   Tobacco Use  Smoking Status Former Smoker  . Packs/day: 1.00  . Years: 35.00  . Pack years: 35.00  . Types: Cigarettes  . Quit date: 03/28/2003  . Years since quitting: 17.3  Smokeless Tobacco Never Used    Goals Met:  Proper associated with RPD/PD & O2 Sat Independence with exercise equipment Improved SOB with ADL's Using PLB without cueing & demonstrates good technique Exercise tolerated well No report of cardiac concerns or symptoms Strength training completed today  Goals Unmet:  Not Applicable  Comments: Check out 1430.   Dr. Kathie Dike is Medical Director for [facility] Pul[facility].

## 2020-08-19 ENCOUNTER — Other Ambulatory Visit: Payer: Self-pay

## 2020-08-19 ENCOUNTER — Encounter (HOSPITAL_COMMUNITY)
Admission: RE | Admit: 2020-08-19 | Discharge: 2020-08-19 | Disposition: A | Discharge status: Home / Self Care | Payer: Medicare Other | Source: Ambulatory Visit | Attending: Pulmonary Disease | Admitting: Pulmonary Disease

## 2020-08-19 VITALS — Wt 199.7 lb

## 2020-08-19 DIAGNOSIS — J441 Chronic obstructive pulmonary disease with (acute) exacerbation: Secondary | ICD-10-CM

## 2020-08-19 NOTE — Progress Notes (Signed)
Daily Session Note  Patient Details  Name: [name] [name] MRN: [id number] Date of Birth: 1941/03/17 Referring Provider:   [place] from 08/08/2020 in [place]  Referring Provider Dr. Ander Slade      Encounter Date: 08/19/2020  Check In:  Session Check In - 08/19/20 1330      Check-In   Supervising physician immediately available to respond to emergencies [facility] MD immediately available    Physician(s) Dr. Harl Bowie    Location [facility]    Staff Present Cathren Harsh, MS, Exercise Physiologist;Phyllis Billingsley, Edison Simon, MS, ACSM-CEP, Exercise Physiologist    Virtual Visit No    Medication changes reported     No    Fall or balance concerns reported    No    Tobacco Cessation No Change    Warm-up and Cool-down Performed as group-led instruction    Resistance Training Performed Yes    VAD Patient? No    PAD/SET Patient? No      Pain Assessment   Currently in Pain? Yes    Pain Score 4     Pain Location Arm    Pain Orientation Upper    Pain Descriptors / Indicators Aching    Pain Type Acute pain    Pain Onset 1 to 4 weeks ago    Pain Frequency Intermittent    Effect of Pain on Daily Activities no effect    Multiple Pain Sites No           Capillary Blood Glucose: No results found for this or any previous visit (from the past 24 hour(s)).    Social History   Tobacco Use  Smoking Status Former Smoker  . Packs/day: 1.00  . Years: 35.00  . Pack years: 35.00  . Types: Cigarettes  . Quit date: 03/28/2003  . Years since quitting: 17.4  Smokeless Tobacco Never Used    Goals Met:  Independence with exercise equipment Exercise tolerated well No report of cardiac concerns or symptoms Strength training completed today  Goals Unmet:  Not Applicable  Comments: checkout time is 1430   Dr. Kathie Dike is Medical Director for [facility] Pul[facility] Re[facility].

## 2020-08-20 NOTE — Progress Notes (Signed)
Pulmonary Individual Treatment Plan  Patient Details  Name: [name] [name] MRN: [id number] Date of Birth: 1941/02/08 Referring Provider:   Flowsheet Row PULMONARY REHAB OTHER RESP ORIENTATION from 08/08/2020 in [place]  Referring Provider Dr. Ander Slade      Initial Encounter Date:  Flowsheet Row PULMONARY REHAB OTHER RESP ORIENTATION from 08/08/2020 in [place]  Date 08/08/20      Visit Diagnosis: COPD with acute exacerbation ([place])  Patient's Home Medications on Admission:   Current Outpatient Medications:  .  albuterol (PROAIR HFA) 108 (90 Base) MCG/ACT inhaler, INHALE 2 PUFFS INTO THE LUNGS EVERY SIX HOURS AS NEEDED FOR WHEEZING., Disp: 8.5 g, Rfl: 2 .  albuterol (PROVENTIL) (2.5 MG/3ML) 0.083% nebulizer solution, Take 3 mLs (2.5 mg total) by nebulization every 6 (six) hours as needed for wheezing or shortness of breath., Disp: 100 mL, Rfl: 2 .  albuterol (VENTOLIN HFA) 108 (90 Base) MCG/ACT inhaler, 2 puffs as needed, Disp: , Rfl:  .  alfuzosin (UROXATRAL) 10 MG 24 hr tablet, Take 1 tablet (10 mg total) by mouth daily with breakfast., Disp: 90 tablet, Rfl: 3 .  cetirizine (ZYRTEC) 10 MG tablet, Take 10 mg by mouth daily., Disp: , Rfl:  .  dorzolamide-timolol (COSOPT) 22.3-6.8 MG/ML ophthalmic solution, Place 1 drop into both eyes 2 (two) times daily. , Disp: , Rfl:  .  esomeprazole (NEXIUM) 20 MG capsule, Take 20 mg by mouth daily at 12 noon., Disp: , Rfl:  .  esomeprazole (NEXIUM) 40 MG capsule, Take 40 mg by mouth daily., Disp: , Rfl:  .  fluticasone (FLOVENT HFA) 220 MCG/ACT inhaler, Inhale 2 puffs into the lungs daily., Disp: 12 g, Rfl: 5 .  hydroxypropyl methylcellulose / hypromellose (ISOPTO TEARS / GONIOVISC) 2.5 % ophthalmic solution, Place 1 drop into both eyes 3 (three) times daily as needed for dry eyes., Disp: , Rfl:  .  LORazepam (ATIVAN) 0.5 MG tablet, Take 1 tablet (0.5 mg total) by mouth at bedtime., Disp: 30 tablet, Rfl:  3 .  losartan (COZAAR) 25 MG tablet, Take 1 tablet (25 mg total) by mouth daily., Disp: 90 tablet, Rfl: 1 .  lovastatin (MEVACOR) 40 MG tablet, Take 1 tablet (40 mg total) by mouth daily., Disp: 90 tablet, Rfl: 1 .  nitroGLYCERIN (NITROSTAT) 0.4 MG SL tablet, Place 1 tablet (0.4 mg total) under the tongue every 5 (five) minutes as needed for chest pain., Disp: 25 tablet, Rfl: 5 .  pseudoephedrine-guaifenesin (MUCINEX D) 60-600 MG 12 hr tablet, Take 1 tablet by mouth 2 (two) times daily as needed for congestion., Disp: , Rfl:  .  sodium chloride (OCEAN) 0.65 % SOLN nasal spray, Place 1 spray into both nostrils as needed for congestion., Disp: , Rfl:   Past Medical History: Past Medical History:  Diagnosis Date  . AAA (abdominal aortic aneurysm) ([place])    needs yearly ultrasound  . Allergy   . Anemia   . Arthritis   . Asthma   . BCC (basal cell carcinoma) 08/18/1989   left shoulder blad, upper right arm, left arm beyond elbow, c&d  . BCC (basal cell carcinoma) 01/31/1992   Posterior neck, curetx3, 98fu  . BCC (basal cell carcinoma) 11/22/2001   mid forehead, cx3, excision, right forearm, cx3, 85fu  . BCC (basal cell carcinoma) 10/09/2003   mid forehead, MOHs  . BCC (basal cell carcinoma) 08/15/2008   upper left back, biopsy  . BPH (benign prostatic hyperplasia)   . CAD (coronary artery disease)   .  Cancer ([place])    skin cancer  . COPD (chronic obstructive pulmonary disease) ([place])   . Dysrhythmia    pt. states it can be fast at times  . GERD (gastroesophageal reflux disease)   . Glaucoma   . HOH (hard of hearing)   . Hypercholesterolemia   . Hypertension   . Impaired fasting glucose   . Low back pain   . Melanoma in situ ([place]) 10/09/2003   left chin, MOHs  . MI (myocardial infarction) ([place]) 1999  . SCC (squamous cell carcinoma) 07/03/2014   in situ, behind left ear, cx3, cautery, 82fu  . SCC (squamous cell carcinoma) 07/03/2014   well diff, left forearm, biopsy, cx1, cautery  .  SCC (squamous cell carcinoma) 07/20/2017   in situ, left upper arm, cx3, 56fu  . SCC (squamous cell carcinoma) 01/10/2019   in situ, left post shoulder, cx3, 13fu  . SCC (squamous cell carcinoma) 11/22/2001   left forearm distal, left forearm, cx3, 42fu  . SCC (squamous cell carcinoma) 10/09/2003   [place], left ear post, clear per st, right cheek clear  . SCC (squamous cell carcinoma) 03/30/2004   in situ, left upper arm, cx3, 44fu  . SCC (squamous cell carcinoma) 03/08/2005   in situ, right cheek, mid upper forehead, cx3, 49fu  . SCC (squamous cell carcinoma) 06/08/2006   in situ, left shoulder, cx3, 24fu  . SCC (squamous cell carcinoma) 05/05/2010   right inner wrist, biopsy  . SCC (squamous cell carcinoma) 09/13/2013   in situ, right crown scalp, front scalp, biopsy  . Thrush     Tobacco Use: Social History   Tobacco Use  Smoking Status Former Smoker  . Packs/day: 1.00  . Years: 35.00  . Pack years: 35.00  . Types: Cigarettes  . Quit date: 03/28/2003  . Years since quitting: 17.4  Smokeless Tobacco Never Used    Labs: Recent Chemical engineer    Labs for ITP Cardiac and Pulmonary Rehab Latest Ref Rng & Units 07/25/2015 06/23/2017 02/14/2019 09/05/2019 11/12/2019   Cholestrol 0 - 200 mg/dL 158 122 117 124 107   LDLCALC 0 - 99 mg/dL 92 67 61 63 61   HDL >40 mg/dL 44 43 38(L) 46 35(L)   Trlycerides <150 mg/dL 111 61 93 71 54      Capillary Blood Glucose: Lab Results  Component Value Date   GLUCAP 97 09/17/2016   GLUCAP 145 (H) 09/16/2016     Pulmonary Assessment Scores:  Pulmonary Assessment Scores    Row Name 08/08/20 1254         ADL UCSD   SOB Score total 22           CAT Score   CAT Score 19           mMRC Score   mMRC Score 2           UCSD: Self-administered rating of dyspnea associated with activities of daily living (ADLs) 6-point scale (0 = "not at all" to 5 = "maximal or unable to do because of breathlessness")  Scoring Scores range from 0  to 120.  Minimally important difference is 5 units  CAT: CAT can identify the health impairment of COPD patients and is better correlated with disease progression.  CAT has a scoring range of zero to 40. The CAT score is classified into four groups of low (less than 10), medium (10 - 20), high (21-30) and very high (31-40) based on the impact level of disease on  health status. A CAT score over 10 suggests significant symptoms.  A worsening CAT score could be explained by an exacerbation, poor medication adherence, poor inhaler technique, or progression of COPD or comorbid conditions.  CAT MCID is 2 points  mMRC: mMRC (Modified Medical Research Council) Dyspnea Scale is used to assess the degree of baseline functional disability in patients of respiratory disease due to dyspnea. No minimal important difference is established. A decrease in score of 1 point or greater is considered a positive change.   Pulmonary Function Assessment:   Exercise Target Goals: Exercise Program Goal: Individual exercise prescription set using results from initial 6 min walk test and THRR while considering  patient's activity barriers and safety.   Exercise Prescription Goal: Initial exercise prescription builds to 30-45 minutes a day of aerobic activity, 2-3 days per week.  Home exercise guidelines will be given to patient during program as part of exercise prescription that the participant will acknowledge.  Activity Barriers & Risk Stratification:  Activity Barriers & Cardiac Risk Stratification - 08/08/20 1253      Activity Barriers & Cardiac Risk Stratification   Activity Barriers Arthritis;Back Problems;Neck/Spine Problems;Deconditioning;Muscular Weakness;Shortness of Breath    Cardiac Risk Stratification High           6 Minute Walk:  6 Minute Walk    Row Name 08/08/20 1428         6 Minute Walk   Phase Initial     Distance 1200 feet     Walk Time 6 minutes     # of Rest Breaks 0     MPH  2.27     METS 2.43     RPE 11     Perceived Dyspnea  13     VO2 Peak 8.51     Resting HR 72 bpm     Resting BP 112/74     Resting Oxygen Saturation  96 %     Exercise Oxygen Saturation  during 6 min walk 91 %     Max Ex. HR 99 bpm     Max Ex. BP 128/64     2 Minute Post BP 106/70           Interval HR   1 Minute HR 96     2 Minute HR 99     3 Minute HR 97     4 Minute HR 93     5 Minute HR 96     6 Minute HR 96     2 Minute Post HR 83     Interval Heart Rate? Yes           Interval Oxygen   Interval Oxygen? Yes     Baseline Oxygen Saturation % 96 %     1 Minute Oxygen Saturation % 93 %     1 Minute Liters of Oxygen 0 L     2 Minute Oxygen Saturation % 91 %     2 Minute Liters of Oxygen 0 L     3 Minute Oxygen Saturation % 92 %     3 Minute Liters of Oxygen 0 L     4 Minute Oxygen Saturation % 91 %     4 Minute Liters of Oxygen 0 L     5 Minute Oxygen Saturation % 92 %     5 Minute Liters of Oxygen 0 L     6 Minute Oxygen Saturation % 92 %     6 Minute Liters  of Oxygen 0 L     2 Minute Post Oxygen Saturation % 96 %     2 Minute Post Liters of Oxygen 0 L            Oxygen Initial Assessment:  Oxygen Initial Assessment - 08/08/20 1433      Initial 6 min Walk   Oxygen Used None      Program Oxygen Prescription   Program Oxygen Prescription None      Intervention   Short Term Goals To learn and exhibit compliance with exercise, home and travel O2 prescription;To learn and understand importance of monitoring SPO2 with pulse oximeter and demonstrate accurate use of the pulse oximeter.;To learn and understand importance of maintaining oxygen saturations>88%;To learn and demonstrate proper pursed lip breathing techniques or other breathing techniques.;To learn and demonstrate proper use of respiratory medications    Long  Term Goals Exhibits compliance with exercise, home and travel O2 prescription;Verbalizes importance of monitoring SPO2 with pulse oximeter and  return demonstration;Maintenance of O2 saturations>88%;Exhibits proper breathing techniques, such as pursed lip breathing or other method taught during program session;Compliance with respiratory medication           Oxygen Re-Evaluation:  Oxygen Re-Evaluation    Row Name 08/19/20 1613             Program Oxygen Prescription   Program Oxygen Prescription None               Home Oxygen   Home Oxygen Device None       Sleep Oxygen Prescription None       Home Exercise Oxygen Prescription None       Home Resting Oxygen Prescription None       Compliance with Home Oxygen Use Yes               Goals/Expected Outcomes   Short Term Goals To learn and exhibit compliance with exercise, home and travel O2 prescription;To learn and understand importance of monitoring SPO2 with pulse oximeter and demonstrate accurate use of the pulse oximeter.;To learn and understand importance of maintaining oxygen saturations>88%;To learn and demonstrate proper pursed lip breathing techniques or other breathing techniques.;To learn and demonstrate proper use of respiratory medications       Long  Term Goals Exhibits compliance with exercise, home and travel O2 prescription;Verbalizes importance of monitoring SPO2 with pulse oximeter and return demonstration;Maintenance of O2 saturations>88%;Exhibits proper breathing techniques, such as pursed lip breathing or other method taught during program session;Compliance with respiratory medication       Goals/Expected Outcomes compliance              Oxygen Discharge (Final Oxygen Re-Evaluation):  Oxygen Re-Evaluation - 08/19/20 1613      Program Oxygen Prescription   Program Oxygen Prescription None      Home Oxygen   Home Oxygen Device None    Sleep Oxygen Prescription None    Home Exercise Oxygen Prescription None    Home Resting Oxygen Prescription None    Compliance with Home Oxygen Use Yes      Goals/Expected Outcomes   Short Term Goals To  learn and exhibit compliance with exercise, home and travel O2 prescription;To learn and understand importance of monitoring SPO2 with pulse oximeter and demonstrate accurate use of the pulse oximeter.;To learn and understand importance of maintaining oxygen saturations>88%;To learn and demonstrate proper pursed lip breathing techniques or other breathing techniques.;To learn and demonstrate proper use of respiratory medications    Long  Term Goals Exhibits compliance with exercise, home and travel O2 prescription;Verbalizes importance of monitoring SPO2 with pulse oximeter and return demonstration;Maintenance of O2 saturations>88%;Exhibits proper breathing techniques, such as pursed lip breathing or other method taught during program session;Compliance with respiratory medication    Goals/Expected Outcomes compliance           Initial Exercise Prescription:  Initial Exercise Prescription - 08/08/20 1400      Date of Initial Exercise RX and Referring Provider   Date 08/08/20    Referring Provider Dr. Ander Slade    Expected Discharge Date 12/11/20      NuStep   Level 1    SPM 80    Minutes 22      Arm Ergometer   Level 1    RPM 45    Minutes 17      Prescription Details   Frequency (times per week) 2    Duration Progress to 30 minutes of continuous aerobic without signs/symptoms of physical distress      Intensity   THRR 40-80% of Max Heartrate 57-114    Ratings of Perceived Exertion 11-13    Perceived Dyspnea 0-4      Resistance Training   Training Prescription Yes    Weight 3 lbs    Reps 10-15           Perform Capillary Blood Glucose checks as needed.  Exercise Prescription Changes:   Exercise Prescription Changes    Row Name 08/19/20 1600             Response to Exercise   Blood Pressure (Admit) 110/68       Blood Pressure (Exercise) 122/70       Blood Pressure (Exit) 104/70       Heart Rate (Admit) 92 bpm       Heart Rate (Exercise) 93 bpm       Heart  Rate (Exit) 87 bpm       Oxygen Saturation (Admit) 95 %       Oxygen Saturation (Exercise) 93 %       Oxygen Saturation (Exit) 95 %       Rating of Perceived Exertion (Exercise) 14       Perceived Dyspnea (Exercise) 12       Duration Continue with 30 min of aerobic exercise without signs/symptoms of physical distress.       Intensity THRR unchanged               Progression   Progression Continue to progress workloads to maintain intensity without signs/symptoms of physical distress.               Resistance Training   Training Prescription Yes       Weight 3 lbs       Reps 10-15               NuStep   Level 1       SPM 84       Minutes 22       METs 2               Arm Ergometer   Level 1       RPM 53       Minutes 17       METs 1.8              Exercise Comments:   Exercise Goals and Review:   Exercise Goals    Row Name 08/08/20 1432 08/19/20  1616           Exercise Goals   Increase Physical Activity Yes Yes      Intervention Provide advice, education, support and counseling about physical activity/exercise needs.;Develop an individualized exercise prescription for aerobic and resistive training based on initial evaluation findings, risk stratification, comorbidities and participant's personal goals. Provide advice, education, support and counseling about physical activity/exercise needs.;Develop an individualized exercise prescription for aerobic and resistive training based on initial evaluation findings, risk stratification, comorbidities and participant's personal goals.      Expected Outcomes Short Term: Attend rehab on a regular basis to increase amount of physical activity.;Long Term: Add in home exercise to make exercise part of routine and to increase amount of physical activity.;Long Term: Exercising regularly at least 3-5 days a week. Short Term: Attend rehab on a regular basis to increase amount of physical activity.;Long Term: Add in home exercise to  make exercise part of routine and to increase amount of physical activity.;Long Term: Exercising regularly at least 3-5 days a week.      Increase Strength and Stamina Yes Yes      Intervention Provide advice, education, support and counseling about physical activity/exercise needs.;Develop an individualized exercise prescription for aerobic and resistive training based on initial evaluation findings, risk stratification, comorbidities and participant's personal goals. Provide advice, education, support and counseling about physical activity/exercise needs.;Develop an individualized exercise prescription for aerobic and resistive training based on initial evaluation findings, risk stratification, comorbidities and participant's personal goals.      Expected Outcomes Short Term: Increase workloads from initial exercise prescription for resistance, speed, and METs.;Short Term: Perform resistance training exercises routinely during rehab and add in resistance training at home;Long Term: Improve cardiorespiratory fitness, muscular endurance and strength as measured by increased METs and functional capacity (6MWT) Short Term: Increase workloads from initial exercise prescription for resistance, speed, and METs.;Short Term: Perform resistance training exercises routinely during rehab and add in resistance training at home;Long Term: Improve cardiorespiratory fitness, muscular endurance and strength as measured by increased METs and functional capacity (6MWT)      Able to understand and use rate of perceived exertion (RPE) scale Yes Yes      Intervention Provide education and explanation on how to use RPE scale Provide education and explanation on how to use RPE scale      Expected Outcomes Short Term: Able to use RPE daily in rehab to express subjective intensity level;Long Term:  Able to use RPE to guide intensity level when exercising independently Short Term: Able to use RPE daily in rehab to express subjective  intensity level;Long Term:  Able to use RPE to guide intensity level when exercising independently      Able to understand and use Dyspnea scale Yes Yes      Intervention Provide education and explanation on how to use Dyspnea scale Provide education and explanation on how to use Dyspnea scale      Expected Outcomes Short Term: Able to use Dyspnea scale daily in rehab to express subjective sense of shortness of breath during exertion;Long Term: Able to use Dyspnea scale to guide intensity level when exercising independently Short Term: Able to use Dyspnea scale daily in rehab to express subjective sense of shortness of breath during exertion;Long Term: Able to use Dyspnea scale to guide intensity level when exercising independently      Knowledge and understanding of Target Heart Rate Range (THRR) Yes Yes      Intervention Provide education and explanation of  THRR including how the numbers were predicted and where they are located for reference Provide education and explanation of THRR including how the numbers were predicted and where they are located for reference      Expected Outcomes Short Term: Able to state/look up THRR;Long Term: Able to use THRR to govern intensity when exercising independently;Short Term: Able to use daily as guideline for intensity in rehab Short Term: Able to state/look up THRR;Long Term: Able to use THRR to govern intensity when exercising independently;Short Term: Able to use daily as guideline for intensity in rehab      Understanding of Exercise Prescription Yes Yes      Intervention Provide education, explanation, and written materials on patient's individual exercise prescription Provide education, explanation, and written materials on patient's individual exercise prescription      Expected Outcomes Short Term: Able to explain program exercise prescription;Long Term: Able to explain home exercise prescription to exercise independently Short Term: Able to explain program  exercise prescription;Long Term: Able to explain home exercise prescription to exercise independently             Exercise Goals Re-Evaluation :  Exercise Goals Re-Evaluation    Row Name 08/19/20 1616             Exercise Goal Re-Evaluation   Exercise Goals Review Increase Physical Activity;Increase Strength and Stamina;Able to understand and use rate of perceived exertion (RPE) scale;Able to understand and use Dyspnea scale;Knowledge and understanding of Target Heart Rate Range (THRR);Understanding of Exercise Prescription       Comments Pt has attended 3 rehab sessions. He has been able to tolerate exercise well and should progress well through the program. He currently exercises at 2.0 METs on the stepper. Will continue to monitor and progress as able.       Expected Outcomes Through exercise at rehab and through engaging in a home exercise program, the patient will meet their stated goals.              Discharge Exercise Prescription (Final Exercise Prescription Changes):  Exercise Prescription Changes - 08/19/20 1600      Response to Exercise   Blood Pressure (Admit) 110/68    Blood Pressure (Exercise) 122/70    Blood Pressure (Exit) 104/70    Heart Rate (Admit) 92 bpm    Heart Rate (Exercise) 93 bpm    Heart Rate (Exit) 87 bpm    Oxygen Saturation (Admit) 95 %    Oxygen Saturation (Exercise) 93 %    Oxygen Saturation (Exit) 95 %    Rating of Perceived Exertion (Exercise) 14    Perceived Dyspnea (Exercise) 12    Duration Continue with 30 min of aerobic exercise without signs/symptoms of physical distress.    Intensity THRR unchanged      Progression   Progression Continue to progress workloads to maintain intensity without signs/symptoms of physical distress.      Resistance Training   Training Prescription Yes    Weight 3 lbs    Reps 10-15      NuStep   Level 1    SPM 84    Minutes 22    METs 2      Arm Ergometer   Level 1    RPM 53    Minutes 17     METs 1.8           Nutrition:  Target Goals: Understanding of nutrition guidelines, daily intake of sodium 1500mg , cholesterol 200mg , calories 30% from fat and  7% or less from saturated fats, daily to have 5 or more servings of fruits and vegetables.  Biometrics:  Pre Biometrics - 08/08/20 1432      Pre Biometrics   Height 6\' 1"  (1.854 m)    Weight 90.6 kg    Waist Circumference 44 inches    Hip Circumference 41.5 inches    Waist to Hip Ratio 1.06 %    BMI (Calculated) 26.36    Triceps Skinfold 11 mm    % Body Fat 27.6 %    Grip Strength 30.2 kg    Flexibility 0 in    Single Leg Stand 1 seconds            Nutrition Therapy Plan and Nutrition Goals:  Nutrition Therapy & Goals - 08/13/20 1353      Personal Nutrition Goals   Comments Patient scored 84 on diet assestment.  We will continue to provide nutritional education and monitor.      Intervention Plan   Intervention Nutrition handout(s) given to patient.           Nutrition Assessments:  Nutrition Assessments - 08/08/20 1256      MEDFICTS Scores   Post Score 84          MEDIFICTS Score Key:  ?70 Need to make dietary changes   40-70 Heart Healthy Diet  ? 40 Therapeutic Level Cholesterol Diet   Picture Your Plate Scores:  <10 Unhealthy dietary pattern with much room for improvement.  41-50 Dietary pattern unlikely to meet recommendations for good health and room for improvement.  51-60 More healthful dietary pattern, with some room for improvement.   >60 Healthy dietary pattern, although there may be some specific behaviors that could be improved.    Nutrition Goals Re-Evaluation:   Nutrition Goals Discharge (Final Nutrition Goals Re-Evaluation):   Psychosocial: Target Goals: Acknowledge presence or absence of significant depression and/or stress, maximize coping skills, provide positive support system. Participant is able to verbalize types and ability to use techniques and skills  needed for reducing stress and depression.  Initial Review & Psychosocial Screening:  Initial Psych Review & Screening - 08/08/20 1255      Initial Review   Current issues with Current Stress Concerns    Source of Stress Concerns Family    Comments Grieving the death of his wife.      Family Dynamics   Good Support System? Yes    Comments His two children and his grandchildren are his support system.      Barriers   Psychosocial barriers to participate in program The patient should benefit from training in stress management and relaxation.      Screening Interventions   Interventions Encouraged to exercise;Provide feedback about the scores to participant;To provide support and resources with identified psychosocial needs    Expected Outcomes Long Term Goal: Stressors or current issues are controlled or eliminated.;Short Term goal: Identification and review with participant of any Quality of Life or Depression concerns found by scoring the questionnaire.;Long Term goal: The participant improves quality of Life and PHQ9 Scores as seen by post scores and/or verbalization of changes           Quality of Life Scores:  Quality of Life - 08/08/20 1435      Quality of Life   Select Quality of Life      Quality of Life Scores   Health/Function Pre 9.34 %    Socioeconomic Pre 16.4 %    Psych/Spiritual Pre 15.08 %  Family Pre 23.63 %    GLOBAL Pre 13.44 %          Scores of 19 and below usually indicate a poorer quality of life in these areas.  A difference of  2-3 points is a clinically meaningful difference.  A difference of 2-3 points in the total score of the Quality of Life Index has been associated with significant improvement in overall quality of life, self-image, physical symptoms, and general health in studies assessing change in quality of life.   PHQ-9: Recent Review Flowsheet Data    Depression screen [facility] 2/9 08/08/2020 06/25/2020 02/13/2019   Decreased Interest 3  0 0    Down, Depressed, Hopeless 0 0 0   PHQ - 2 Score 3 0 0   Altered sleeping 0 - -   Tired, decreased energy 3 - -   Change in appetite 0 - -   Feeling bad or failure about yourself  0 - -   Trouble concentrating 0 - -   Moving slowly or fidgety/restless 0 - -   Suicidal thoughts 0 - -   PHQ-9 Score 6 - -   Difficult doing work/chores Somewhat difficult - -     Interpretation of Total Score  Total Score Depression Severity:  1-4 = Minimal depression, 5-9 = Mild depression, 10-14 = Moderate depression, 15-19 = Moderately severe depression, 20-27 = Severe depression   Psychosocial Evaluation and Intervention:  Psychosocial Evaluation - 08/08/20 1438      Psychosocial Evaluation & Interventions   Interventions Stress management education;Relaxation education;Encouraged to exercise with the program and follow exercise prescription    Comments Pt has no barriers to participating in pulmonary rehab. He is still grieving the death of his wife who died 3 years ago. They were married for 58 years. He reports that he no longer enjoys doing a lot of the things that he used to due to his sadness. He stated that he tried to attend some grief support groups at first, but he states he thought that they were not beneficial at all. He was defensive when talking about the subject of his wife's death and when I asked if he needed to talk to a councelor. He states that he will cope on his own, but I do think he would benefit from professional support. He reports that he has a good support system now with his 2 children and his grandchildren. A lot of his other problems stem from his wife's death. He reports that she did all of the cooking and he had never learned to cook, so now his diet has gotten unhealthy. Additionally, he states that he let himself go physically after her death. This has made the SOB from his COPD even worse. He scored a 6 on his PHQ-9 and a 13.44 on his QOL. His decreased QOL is associated  with his grieving and his health. He does have optimism that the pulmonary rehab program will better his health and help him breath better. He will benefit from stress management education, relaxation education, and overall social support from staff and other patients.    Expected Outcomes Through education and exercise, the patient's stressors will be reduced.    Continue Psychosocial Services  Follow up required by staff           Psychosocial Re-Evaluation:  Psychosocial Re-Evaluation    [place] Name 08/13/20 1343             Psychosocial Re-Evaluation   Current issues  with None Identified       Comments Patient is new in program . His initial QOL score is 13.44 and his PHQ-9 was 6. He seems to enjoy coming and interacting with other group members and staff. He was referred to pulmonary rehav by Dr. Ander Slade with a diagnosis of COPDwith acute exacerbation.  His personal goals for the program are to improve SOB, lose weight and get stronger.  We will continue to monitor as he works toward meeting these goals.       Expected Outcomes Patient will have no psychosocial barriers identified at discharge.       Interventions Stress management education;Relaxation education;Encouraged to attend Pulmonary Rehabilitation for the exercise       Continue Psychosocial Services  No Follow up required              Psychosocial Discharge (Final Psychosocial Re-Evaluation):  Psychosocial Re-Evaluation - 08/13/20 1343      Psychosocial Re-Evaluation   Current issues with None Identified    Comments Patient is new in program . His initial QOL score is 13.44 and his PHQ-9 was 6. He seems to enjoy coming and interacting with other group members and staff. He was referred to pulmonary rehav by Dr. Ander Slade with a diagnosis of COPDwith acute exacerbation.  His personal goals for the program are to improve SOB, lose weight and get stronger.  We will continue to monitor as he works toward meeting these goals.     Expected Outcomes Patient will have no psychosocial barriers identified at discharge.    Interventions Stress management education;Relaxation education;Encouraged to attend Pulmonary Rehabilitation for the exercise    Continue Psychosocial Services  No Follow up required            Education: Education Goals: Education classes will be provided on a weekly basis, covering required topics. Participant will state understanding/return demonstration of topics presented.  Learning Barriers/Preferences:  Learning Barriers/Preferences - 08/08/20 1304      Learning Barriers/Preferences   Learning Barriers Hearing    Learning Preferences Written Material;Audio;Skilled Demonstration           Education Topics: How Lungs Work and Diseases: - Discuss the anatomy of the lungs and diseases that can affect the lungs, such as COPD.   Exercise: -Discuss the importance of exercise, FITT principles of exercise, normal and abnormal responses to exercise, and how to exercise safely.   Environmental Irritants: -Discuss types of environmental irritants and how to limit exposure to environmental irritants.   Meds/Inhalers and oxygen: - Discuss respiratory medications, definition of an inhaler and oxygen, and the proper way to use an inhaler and oxygen.   Energy Saving Techniques: - Discuss methods to conserve energy and decrease shortness of breath when performing activities of daily living.    Bronchial Hygiene / Breathing Techniques: - Discuss breathing mechanics, pursed-lip breathing technique,  proper posture, effective ways to clear airways, and other functional breathing techniques   Cleaning Equipment: - Provides group verbal and written instruction about the health risks of elevated stress, cause of high stress, and healthy ways to reduce stress.   Nutrition I: Fats: - Discuss the types of cholesterol, what cholesterol does to the body, and how cholesterol levels can be  controlled.   Nutrition II: Labels: -Discuss the different components of food labels and how to read food labels.   Respiratory Infections: - Discuss the signs and symptoms of respiratory infections, ways to prevent respiratory infections, and the importance of seeking medical treatment  when having a respiratory infection.   Stress I: Signs and Symptoms: - Discuss the causes of stress, how stress may lead to anxiety and depression, and ways to limit stress.   Stress II: Relaxation: -Discuss relaxation techniques to limit stress.   Oxygen for Home/Travel: - Discuss how to prepare for travel when on oxygen and proper ways to transport and store oxygen to ensure safety.   Knowledge Questionnaire Score:  Knowledge Questionnaire Score - 08/08/20 1304      Knowledge Questionnaire Score   Pre Score 14/18           Core Components/Risk Factors/Patient Goals at Admission:  Personal Goals and Risk Factors at Admission - 08/08/20 1304      Core Components/Risk Factors/Patient Goals on Admission    Weight Management Yes;Weight Loss    Intervention Weight Management: Develop a combined nutrition and exercise program designed to reach desired caloric intake, while maintaining appropriate intake of nutrient and fiber, sodium and fats, and appropriate energy expenditure required for the weight goal.;Weight Management: Provide education and appropriate resources to help participant work on and attain dietary goals.;Weight Management/Obesity: Establish reasonable short term and long term weight goals.;Obesity: Provide education and appropriate resources to help participant work on and attain dietary goals.    Expected Outcomes Short Term: Continue to assess and modify interventions until short term weight is achieved;Long Term: Adherence to nutrition and physical activity/exercise program aimed toward attainment of established weight goal;Weight Loss: Understanding of general recommendations for  a balanced deficit meal plan, which promotes 1-2 lb weight loss per week and includes a negative energy balance of [phone] kcal/d;Understanding recommendations for meals to include 15-35% energy as protein, 25-35% energy from fat, 35-60% energy from carbohydrates, less than 200mg  of dietary cholesterol, 20-35 gm of total fiber daily;Understanding of distribution of calorie intake throughout the day with the consumption of 4-5 meals/snacks    Improve shortness of breath with ADL's Yes    Intervention Provide education, individualized exercise plan and daily activity instruction to help decrease symptoms of SOB with activities of daily living.    Expected Outcomes Short Term: Improve cardiorespiratory fitness to achieve a reduction of symptoms when performing ADLs;Long Term: Be able to perform more ADLs without symptoms or delay the onset of symptoms    Stress Yes    Intervention Offer individual and/or small group education and counseling on adjustment to heart disease, stress management and health-related lifestyle change. Teach and support self-help strategies.;Refer participants experiencing significant psychosocial distress to appropriate mental health specialists for further evaluation and treatment. When possible, include family members and significant others in education/counseling sessions.    Expected Outcomes Long Term: Emotional wellbeing is indicated by absence of clinically significant psychosocial distress or social isolation.;Short Term: Participant demonstrates changes in health-related behavior, relaxation and other stress management skills, ability to obtain effective social support, and compliance with psychotropic medications if prescribed.           Core Components/Risk Factors/Patient Goals Review:   Goals and Risk Factor Review    [place] Name 08/13/20 1357             Core Components/Risk Factors/Patient Goals Review   Personal Goals Review Improve shortness of breath with  ADL's       Review Patient is new in the program.  His O2 sats was good on room air at 95-96% while exercising.  He seems to have a positive outlook and very interative with others in class.  His personal goals are  to get stronger and have more energy and increase ADL's.  We will continue to monitor as he works toward meeting these goals.       Expected Outcomes Patient will complete the program meeting both program and personal goals.              Core Components/Risk Factors/Patient Goals at Discharge (Final Review):   Goals and Risk Factor Review - 08/13/20 1357      Core Components/Risk Factors/Patient Goals Review   Personal Goals Review Improve shortness of breath with ADL's    Review Patient is new in the program.  His O2 sats was good on room air at 95-96% while exercising.  He seems to have a positive outlook and very interative with others in class.  His personal goals are to get stronger and have more energy and increase ADL's.  We will continue to monitor as he works toward meeting these goals.    Expected Outcomes Patient will complete the program meeting both program and personal goals.           ITP Comments:   Comments: ITP REVIEW Pt is making expected progress toward pulmonary rehab goals after completing 4 sessions. Recommend continued exercise, life style modification, education, and utilization of breathing techniques to increase stamina and strength and decrease shortness of breath with exertion.

## 2020-08-21 ENCOUNTER — Encounter: Payer: Self-pay | Admitting: Orthopaedic Surgery

## 2020-08-21 ENCOUNTER — Encounter (HOSPITAL_COMMUNITY)
Admission: RE | Admit: 2020-08-21 | Discharge: 2020-08-21 | Disposition: A | Discharge status: Home / Self Care | Payer: Medicare Other | Source: Ambulatory Visit | Attending: Pulmonary Disease | Admitting: Pulmonary Disease

## 2020-08-21 ENCOUNTER — Ambulatory Visit (INDEPENDENT_AMBULATORY_CARE_PROVIDER_SITE_OTHER): Payer: Medicare Other | Admitting: Orthopaedic Surgery

## 2020-08-21 ENCOUNTER — Other Ambulatory Visit: Payer: Self-pay

## 2020-08-21 VITALS — BP 143/88 | HR 72 | Ht 73.0 in | Wt 202.0 lb

## 2020-08-21 DIAGNOSIS — M25512 Pain in left shoulder: Secondary | ICD-10-CM | POA: Diagnosis not present

## 2020-08-21 DIAGNOSIS — I2510 Atherosclerotic heart disease of native coronary artery without angina pectoris: Secondary | ICD-10-CM | POA: Diagnosis not present

## 2020-08-21 DIAGNOSIS — J441 Chronic obstructive pulmonary disease with (acute) exacerbation: Secondary | ICD-10-CM

## 2020-08-21 NOTE — Progress Notes (Signed)
Patient UR:[facility] [name] [name], male DOB:09/14/1940, 80 y.o. BJS:[id number]  Chief Complaint  Patient presents with  . Shoulder Pain    L/the shot didn't help at all. It really hurts in the muscle area.    HPI  [name] [name] is a 80 y.o. male who is having more pain in the left shoulder. The injection in January did not help.  He has pain more at night.  He has no numbness.  He has popping.  He is going to pulmonary therapy currently.  He has no new trauma, no numbness.   Body mass index is 26.65 kg/m.  ROS  Review of Systems  Constitutional: Positive for activity change.  Respiratory: Positive for shortness of breath.   Musculoskeletal: Positive for arthralgias and myalgias.  All other systems reviewed and are negative.   All other systems reviewed and are negative.  The following is a summary of the past history medically, past history surgically, known current medicines, social history and family history.  This information is gathered electronically by the computer from prior information and documentation.  I review this each visit and have found including this information at this point in the chart is beneficial and informative.    Past Medical History:  Diagnosis Date  . AAA (abdominal aortic aneurysm) ([place])    needs yearly ultrasound  . Allergy   . Anemia   . Arthritis   . Asthma   . BCC (basal cell carcinoma) 08/18/1989   left shoulder blad, upper right arm, left arm beyond elbow, c&d  . BCC (basal cell carcinoma) 01/31/1992   Posterior neck, curetx3, 57fu  . BCC (basal cell carcinoma) 11/22/2001   mid forehead, cx3, excision, right forearm, cx3, 40fu  . BCC (basal cell carcinoma) 10/09/2003   mid forehead, MOHs  . BCC (basal cell carcinoma) 08/15/2008   upper left back, biopsy  . BPH (benign prostatic hyperplasia)   . CAD (coronary artery disease)   . Cancer ([place])    skin cancer  . COPD (chronic obstructive pulmonary disease) ([place])   . Dysrhythmia    pt. states it  can be fast at times  . GERD (gastroesophageal reflux disease)   . Glaucoma   . HOH (hard of hearing)   . Hypercholesterolemia   . Hypertension   . Impaired fasting glucose   . Low back pain   . Melanoma in situ ([place]) 10/09/2003   left chin, MOHs  . MI (myocardial infarction) ([place]) 1999  . SCC (squamous cell carcinoma) 07/03/2014   in situ, behind left ear, cx3, cautery, 67fu  . SCC (squamous cell carcinoma) 07/03/2014   well diff, left forearm, biopsy, cx1, cautery  . SCC (squamous cell carcinoma) 07/20/2017   in situ, left upper arm, cx3, 12fu  . SCC (squamous cell carcinoma) 01/10/2019   in situ, left post shoulder, cx3, 63fu  . SCC (squamous cell carcinoma) 11/22/2001   left forearm distal, left forearm, cx3, 62fu  . SCC (squamous cell carcinoma) 10/09/2003   [place], left ear post, clear per st, right cheek clear  . SCC (squamous cell carcinoma) 03/30/2004   in situ, left upper arm, cx3, 39fu  . SCC (squamous cell carcinoma) 03/08/2005   in situ, right cheek, mid upper forehead, cx3, 52fu  . SCC (squamous cell carcinoma) 06/08/2006   in situ, left shoulder, cx3, 31fu  . SCC (squamous cell carcinoma) 05/05/2010   right inner wrist, biopsy  . SCC (squamous cell carcinoma) 09/13/2013   in situ, right crown scalp,  front scalp, biopsy  . Thrush     Past Surgical History:  Procedure Laterality Date  . BACK SURGERY     x 3  . CARDIAC CATHETERIZATION     angioplasty  . CATARACT EXTRACTION W/PHACO  03/20/2012   Procedure: CATARACT EXTRACTION PHACO AND INTRAOCULAR LENS PLACEMENT (IOC);  Surgeon: Williams Che, MD;  Location: [facility];  Service: Ophthalmology;  Laterality: Right;  CDE:  8.45  . CATARACT EXTRACTION W/PHACO Left 04/02/2013   Procedure: CATARACT EXTRACTION PHACO AND INTRAOCULAR LENS PLACEMENT (IOC);  Surgeon: Williams Che, MD;  Location: [facility];  Service: Ophthalmology;  Laterality: Left;  CDE:  6.50  . CHOLECYSTECTOMY  2000  . COLONOSCOPY  2009   repeat 5 years   . ESOPHAGOGASTRODUODENOSCOPY    . HERNIA REPAIR Left    inguinal  . INGUINAL HERNIA REPAIR Right 03/28/2020   Procedure: Right Inguinal Herniorrhaphy with Mesh;  Surgeon: Aviva Signs, MD;  Location: [facility];  Service: General;  Laterality: Right;  . LAPAROSCOPIC PARTIAL COLECTOMY N/A 06/11/2013   Procedure: LAPAROSCOPIC HAND ASSISTED PARTIAL COLECTOMY;  Surgeon: Jamesetta So, MD;  Location: [facility];  Service: General;  Laterality: N/A;  . NASAL ENDOSCOPY WITH EPISTAXIS CONTROL Bilateral 02/11/2020   Procedure: NASAL ENDOSCOPY WITH EPISTAXIS CONTROL;  Surgeon: Leta Baptist, MD;  Location: [facility];  Service: ENT;  Laterality: Bilateral;  . right eye detached retina Bilateral   . SPINAL FUSION  2016  . YAG LASER APPLICATION Left 34/19/6222   Procedure: YAG LASER APPLICATION;  Surgeon: Williams Che, MD;  Location: [facility];  Service: Ophthalmology;  Laterality: Left;    Family History  Problem Relation Age of Onset  . Hypertension Mother   . COPD Father   . Cancer Brother        brain    Social History Social History   Tobacco Use  . Smoking status: Former Smoker    Packs/day: 1.00    Years: 35.00    Pack years: 35.00    Types: Cigarettes    Quit date: 03/28/2003    Years since quitting: 17.4  . Smokeless tobacco: Never Used  Vaping Use  . Vaping Use: Never used  Substance Use Topics  . Alcohol use: No    Alcohol/week: 0.0 standard drinks  . Drug use: No    Allergies  Allergen Reactions  . Beta Adrenergic Blockers Diarrhea and Other (See Comments)    Does not recall an allergy  . Lasix [Furosemide] Rash  . Cefzil [Cefprozil] Nausea Only    Patient does recall allergy  . Dexamethasone Swelling  . Gabapentin Swelling  . Neomycin Other (See Comments)  . Tetracyclines & Related Itching  . Ciprofloxacin Nausea And Vomiting, Rash and Other (See Comments)    Body aches   . Methocarbamol Swelling and Rash  . Penicillins Swelling and Rash    Has patient  had a PCN reaction causing immediate rash, facial/tongue/throat swelling, SOB or lightheadedness with hypotension: yes Has patient had a PCN reaction causing severe rash involving mucus membranes or skin necrosis: unknown Has patient had a PCN reaction that required hospitalization: no Has patient had a PCN reaction occurring within the last 10 years: No If all of the above answers are "NO", then may proceed with Cephalosporin use.     Current Outpatient Medications  Medication Sig Dispense Refill  . albuterol (PROAIR HFA) 108 (90 Base) MCG/ACT inhaler INHALE 2 PUFFS INTO THE LUNGS EVERY SIX HOURS AS NEEDED FOR  WHEEZING. 8.5 g 2  . albuterol (VENTOLIN HFA) 108 (90 Base) MCG/ACT inhaler 2 puffs as needed    . alfuzosin (UROXATRAL) 10 MG 24 hr tablet Take 1 tablet (10 mg total) by mouth daily with breakfast. 90 tablet 3  . cetirizine (ZYRTEC) 10 MG tablet Take 10 mg by mouth daily.    . dorzolamide-timolol (COSOPT) 22.3-6.8 MG/ML ophthalmic solution Place 1 drop into both eyes 2 (two) times daily.     Marland Kitchen esomeprazole (NEXIUM) 20 MG capsule Take 20 mg by mouth daily at 12 noon.    . fluticasone (FLOVENT HFA) 220 MCG/ACT inhaler Inhale 2 puffs into the lungs daily. 12 g 5  . hydroxypropyl methylcellulose / hypromellose (ISOPTO TEARS / GONIOVISC) 2.5 % ophthalmic solution Place 1 drop into both eyes 3 (three) times daily as needed for dry eyes.    Marland Kitchen LORazepam (ATIVAN) 0.5 MG tablet Take 1 tablet (0.5 mg total) by mouth at bedtime. 30 tablet 3  . losartan (COZAAR) 25 MG tablet Take 1 tablet (25 mg total) by mouth daily. 90 tablet 1  . lovastatin (MEVACOR) 40 MG tablet Take 1 tablet (40 mg total) by mouth daily. 90 tablet 1  . nitroGLYCERIN (NITROSTAT) 0.4 MG SL tablet Place 1 tablet (0.4 mg total) under the tongue every 5 (five) minutes as needed for chest pain. 25 tablet 5  . pseudoephedrine-guaifenesin (MUCINEX D) 60-600 MG 12 hr tablet Take 1 tablet by mouth 2 (two) times daily as needed for  congestion.    . sodium chloride (OCEAN) 0.65 % SOLN nasal spray Place 1 spray into both nostrils as needed for congestion.    Marland Kitchen albuterol (PROVENTIL) (2.5 MG/3ML) 0.083% nebulizer solution Take 3 mLs (2.5 mg total) by nebulization every 6 (six) hours as needed for wheezing or shortness of breath. (Patient not taking: Reported on 08/21/2020) 100 mL 2  . esomeprazole (NEXIUM) 40 MG capsule Take 40 mg by mouth daily. (Patient not taking: Reported on 08/21/2020)     No current facility-administered medications for this visit.     Physical Exam  Blood pressure (!) 143/88, pulse 72, height 6\' 1"  (1.854 m), weight 202 lb (91.6 kg).  Constitutional: overall normal hygiene, normal nutrition, well developed, normal grooming, normal body habitus. Assistive device:none  Musculoskeletal: gait and station Limp none, muscle tone and strength are normal, no tremors or atrophy is present.  .  Neurological: coordination overall normal.  Deep tendon reflex/nerve stretch intact.  Sensation normal.  Cranial nerves II-XII intact.   Skin:   Normal overall no scars, lesions, ulcers or rashes. No psoriasis.  Psychiatric: Alert and oriented x 3.  Recent memory intact, remote memory unclear.  Normal mood and affect. Well groomed.  Good eye contact.  Cardiovascular: overall no swelling, no varicosities, no edema bilaterally, normal temperatures of the legs and arms, no clubbing, cyanosis and good capillary refill.  Lymphatic: palpation is normal.  Examination of left Upper Extremity is done.  Inspection:   Overall:  Elbow non-tender without crepitus or defects, forearm non-tender without crepitus or defects, wrist non-tender without crepitus or defects, hand non-tender.    Shoulder: with glenohumeral joint tenderness, without effusion.   Upper arm:  without swelling and tenderness   Range of motion:   Overall:  Full range of motion of the elbow, full range of motion of wrist and full range of motion in  fingers.   Shoulder:  left  160 degrees forward flexion; 140 degrees abduction; 30 degrees internal rotation, 30 degrees external  rotation, 10 degrees extension, 40 degrees adduction.   Stability:   Overall:  Shoulder, elbow and wrist stable   Strength and Tone:   Overall full shoulder muscles strength, full upper arm strength and normal upper arm bulk and tone.  All other systems reviewed and are negative   The patient has been educated about the nature of the problem(s) and counseled on treatment options.  The patient appeared to understand what I have discussed and is in agreement with it.  Encounter Diagnosis  Name Primary?  . Pain in joint of left shoulder Yes    PLAN Call if any problems.  Precautions discussed.  Continue current medications.   Return to clinic 3 weeks   Begin OT.  Electronically Signed Sanjuana Kava, MD 4/7/202210:48 AM

## 2020-08-21 NOTE — Progress Notes (Signed)
Daily Session Note  Patient Details  Name: [name] [name] MRN: [id number] Date of Birth: [birth year] Referring Provider:   [place] from 08/08/2020 in [place]  Referring Provider Dr. Ander Slade      Encounter Date: 08/21/2020  Check In:  Session Check In - 08/21/20 1330      Check-In   Supervising physician immediately available to respond to emergencies [facility] MD immediately available    Physician(s) Dr. Harl Bowie    Location [facility]    Staff Present Aundra Dubin, RN, BSN;Madison Audria Nine, MS, Exercise Physiologist;Dalton Kris Mouton, MS, ACSM-CEP, Exercise Physiologist    Virtual Visit No    Medication changes reported     No    Fall or balance concerns reported    No    Tobacco Cessation No Change    Warm-up and Cool-down Performed as group-led instruction    Resistance Training Performed Yes    VAD Patient? No    PAD/SET Patient? No      Pain Assessment   Currently in Pain? Yes    Pain Score 3     Pain Location Arm    Pain Orientation Upper    Pain Descriptors / Indicators Aching    Pain Type Acute pain    Pain Onset 1 to 4 weeks ago    Multiple Pain Sites No           Capillary Blood Glucose: No results found for this or any previous visit (from the past 24 hour(s)).    Social History   Tobacco Use  Smoking Status Former Smoker  . Packs/day: 1.00  . Years: 35.00  . Pack years: 35.00  . Types: Cigarettes  . Quit date: 03/28/2003  . Years since quitting: 17.4  Smokeless Tobacco Never Used    Goals Met:  Proper associated with RPD/PD & O2 Sat Independence with exercise equipment Improved SOB with ADL's Using PLB without cueing & demonstrates good technique Exercise tolerated well No report of cardiac concerns or symptoms Strength training completed today  Goals Unmet:  Not Applicable  Comments: Check out 1430.   Dr. Kathie Dike is Medical Director for [facility]  Pulmonary Rehab.

## 2020-08-25 ENCOUNTER — Other Ambulatory Visit: Payer: Self-pay

## 2020-08-25 ENCOUNTER — Ambulatory Visit (HOSPITAL_COMMUNITY): Payer: Medicare Other | Attending: Orthopaedic Surgery

## 2020-08-25 ENCOUNTER — Encounter (HOSPITAL_COMMUNITY): Payer: Self-pay

## 2020-08-25 DIAGNOSIS — M25512 Pain in left shoulder: Secondary | ICD-10-CM | POA: Diagnosis not present

## 2020-08-25 DIAGNOSIS — G8929 Other chronic pain: Secondary | ICD-10-CM | POA: Diagnosis not present

## 2020-08-26 ENCOUNTER — Encounter (HOSPITAL_COMMUNITY)
Admission: RE | Admit: 2020-08-26 | Discharge: 2020-08-26 | Disposition: A | Discharge status: Home / Self Care | Payer: Medicare Other | Source: Ambulatory Visit | Attending: Pulmonary Disease | Admitting: Pulmonary Disease

## 2020-08-26 DIAGNOSIS — J441 Chronic obstructive pulmonary disease with (acute) exacerbation: Secondary | ICD-10-CM | POA: Diagnosis not present

## 2020-08-26 NOTE — Progress Notes (Signed)
Daily Session Note  Patient Details  Name: NATHANAEL KRIST MRN: [id number] Date of Birth: 1940-08-04 Referring Provider:   [place] from 08/08/2020 in [place]  Referring Provider Dr. Ander Slade      Encounter Date: 08/26/2020  Check In:  Session Check In - 08/26/20 1330      Check-In   Supervising physician immediately available to respond to emergencies [facility] MD immediately available    Physician(s) Dr. Harl Bowie    Location [facility]    Staff Present Geanie [place], RN;Madison Audria Nine, MS, Exercise Physiologist;Other;Azeez Dunker Kris Mouton, MS, ACSM-CEP, Exercise Physiologist    Virtual Visit No    Medication changes reported     No    Fall or balance concerns reported    No    Tobacco Cessation No Change    Warm-up and Cool-down Performed as group-led instruction    Resistance Training Performed Yes    VAD Patient? No    PAD/SET Patient? No      Pain Assessment   Currently in Pain? No/denies    Pain Score 0-No pain    Multiple Pain Sites No           Capillary Blood Glucose: No results found for this or any previous visit (from the past 24 hour(s)).    Social History   Tobacco Use  Smoking Status Former Smoker  . Packs/day: 1.00  . Years: 35.00  . Pack years: 35.00  . Types: Cigarettes  . Quit date: 03/28/2003  . Years since quitting: 17.4  Smokeless Tobacco Never Used    Goals Met:  Independence with exercise equipment Exercise tolerated well No report of cardiac concerns or symptoms Strength training completed today  Goals Unmet:  Not Applicable  Comments: checkout time is 1430   Dr. Kathie Dike is Medical Director for [facility] Pulmonary Rehab.

## 2020-08-26 NOTE — Therapy (Signed)
[place], [place], [address] Phone: [phone]   Fax:  [phone]  Occupational Therapy Treatment  Patient Details  Name: [name] [name] MRN: [id number] Date of Birth: 1940-05-18 Referring Provider (OT): Sanjuana Kava, MD   Encounter Date: 08/25/2020   OT End of Session - 08/25/20 1754    Visit Number 1    Number of Visits 1    Authorization Type 1) Medicare 2) AARP    OT Start Time 1030    OT Stop Time 1115    OT Time Calculation (min) 45 min    Activity Tolerance Patient tolerated treatment well    Behavior During Therapy [facility] for tasks assessed/performed           Past Medical History:  Diagnosis Date  . AAA (abdominal aortic aneurysm) ([place])    needs yearly ultrasound  . Allergy   . Anemia   . Arthritis   . Asthma   . BCC (basal cell carcinoma) 08/18/1989   left shoulder blad, upper right arm, left arm beyond elbow, c&d  . BCC (basal cell carcinoma) 01/31/1992   Posterior neck, curetx3, 76fu  . BCC (basal cell carcinoma) 11/22/2001   mid forehead, cx3, excision, right forearm, cx3, 37fu  . BCC (basal cell carcinoma) 10/09/2003   mid forehead, MOHs  . BCC (basal cell carcinoma) 08/15/2008   upper left back, biopsy  . BPH (benign prostatic hyperplasia)   . CAD (coronary artery disease)   . Cancer ([place])    skin cancer  . COPD (chronic obstructive pulmonary disease) ([place])   . Dysrhythmia    pt. states it can be fast at times  . GERD (gastroesophageal reflux disease)   . Glaucoma   . HOH (hard of hearing)   . Hypercholesterolemia   . Hypertension   . Impaired fasting glucose   . Low back pain   . Melanoma in situ ([place]) 10/09/2003   left chin, MOHs  . MI (myocardial infarction) ([place]) 1999  . SCC (squamous cell carcinoma) 07/03/2014   in situ, behind left ear, cx3, cautery, 69fu  . SCC (squamous cell carcinoma) 07/03/2014   well diff, left forearm, biopsy, cx1, cautery  . SCC (squamous cell  carcinoma) 07/20/2017   in situ, left upper arm, cx3, 84fu  . SCC (squamous cell carcinoma) 01/10/2019   in situ, left post shoulder, cx3, 42fu  . SCC (squamous cell carcinoma) 11/22/2001   left forearm distal, left forearm, cx3, 65fu  . SCC (squamous cell carcinoma) 10/09/2003   [place], left ear post, clear per st, right cheek clear  . SCC (squamous cell carcinoma) 03/30/2004   in situ, left upper arm, cx3, 20fu  . SCC (squamous cell carcinoma) 03/08/2005   in situ, right cheek, mid upper forehead, cx3, 38fu  . SCC (squamous cell carcinoma) 06/08/2006   in situ, left shoulder, cx3, 56fu  . SCC (squamous cell carcinoma) 05/05/2010   right inner wrist, biopsy  . SCC (squamous cell carcinoma) 09/13/2013   in situ, right crown scalp, front scalp, biopsy  . Thrush     Past Surgical History:  Procedure Laterality Date  . BACK SURGERY     x 3  . CARDIAC CATHETERIZATION     angioplasty  . CATARACT EXTRACTION W/PHACO  03/20/2012   Procedure: CATARACT EXTRACTION PHACO AND INTRAOCULAR LENS PLACEMENT (IOC);  Surgeon: Williams Che, MD;  Location: [facility];  Service: Ophthalmology;  Laterality: Right;  CDE:  8.45  . CATARACT  EXTRACTION W/PHACO Left 04/02/2013   Procedure: CATARACT EXTRACTION PHACO AND INTRAOCULAR LENS PLACEMENT (IOC);  Surgeon: Williams Che, MD;  Location: [facility];  Service: Ophthalmology;  Laterality: Left;  CDE:  6.50  . CHOLECYSTECTOMY  2000  . COLONOSCOPY  2009   repeat 5 years  . ESOPHAGOGASTRODUODENOSCOPY    . HERNIA REPAIR Left    inguinal  . INGUINAL HERNIA REPAIR Right 03/28/2020   Procedure: Right Inguinal Herniorrhaphy with Mesh;  Surgeon: Aviva Signs, MD;  Location: [facility];  Service: General;  Laterality: Right;  . LAPAROSCOPIC PARTIAL COLECTOMY N/A 06/11/2013   Procedure: LAPAROSCOPIC HAND ASSISTED PARTIAL COLECTOMY;  Surgeon: Jamesetta So, MD;  Location: [facility];  Service: General;  Laterality: N/A;  . NASAL ENDOSCOPY WITH EPISTAXIS CONTROL Bilateral  02/11/2020   Procedure: NASAL ENDOSCOPY WITH EPISTAXIS CONTROL;  Surgeon: Leta Baptist, MD;  Location: [place];  Service: ENT;  Laterality: Bilateral;  . right eye detached retina Bilateral   . SPINAL FUSION  2016  . YAG LASER APPLICATION Left 80/99/8338   Procedure: YAG LASER APPLICATION;  Surgeon: Williams Che, MD;  Location: [facility];  Service: Ophthalmology;  Laterality: Left;    There were no vitals filed for this visit.   Subjective Assessment - 08/25/20 1038    Subjective  S: This originally started 2 year ago.    Pertinent History Patient is a 80 y/o male S/P left shoulder pain (per referral) although patient reports that pain is located in the inferior deltoid/brachialis region versus upper/shoulder region. Pt reports that his pain started around 2 years ago. Two years ago    Currently in Pain? Yes    Pain Score 2     Pain Location Arm    Pain Orientation Upper    Pain Descriptors / Indicators Aching    Pain Type Chronic pain    Pain Onset More than a month ago    Pain Frequency Occasional    Aggravating Factors  certain movements, night time is work (restless sleeper with frequent body positions)    Pain Relieving Factors ibeprofen, heating pad, aspercreme    Effect of Pain on Daily Activities no effect when pain is low. But if it is elevated it will stop him from doing needed activities.    Multiple Pain Sites No              [facility] OT Assessment - 08/25/20 1042      Assessment   Medical Diagnosis left upper arm pain    Referring Provider (OT) Sanjuana Kava, MD    Onset Date/Surgical Date --   2 years ago   Hand Dominance Right    Next MD Visit 09/11/20    Prior Therapy 2015 spinal fusion surgery      Precautions   Precautions None      Restrictions   Weight Bearing Restrictions No      Balance Screen   Has the patient fallen in the past 6 months No      Home  Environment   Family/patient expects to be discharged to: Private residence       Prior Function   Level of [place] Retired      ADL   ADL comments Difficulty with certain movements. Will report pain level of 10/10 at times although it's instantaneously and then gone. Reaching across the body, completing IR/er with arm abducted will cause pain.      Mobility   Mobility  Status Independent      Written Expression   Dominant Hand Right      Vision - History   Baseline Vision Wears glasses all the time      Cognition   Overall Cognitive Status Within Functional Limits for tasks assessed      Observation/Other Assessments   Focus on Therapeutic Outcomes (FOTO)  N/A      ROM / Strength   AROM / PROM / Strength AROM;Strength      Palpation   Palpation comment Moderate fascial restrictions noted only in the left inferior deltoid region/brachialis muscle.      AROM   Overall AROM Comments Assessed seated. IR/ er abducted    AROM Assessment Site Shoulder    Right/Left Shoulder Left    Left Shoulder Flexion 155 Degrees    Left Shoulder ABduction 155 Degrees    Left Shoulder Internal Rotation 45 Degrees   pain   Left Shoulder External Rotation 70 Degrees   tight     Strength   Overall Strength Comments Assessed IR/er abducted    Strength Assessment Site Shoulder    Right/Left Shoulder Left    Left Shoulder Flexion 5/5    Left Shoulder ABduction 5/5    Left Shoulder Internal Rotation 5/5   pain   Left Shoulder External Rotation 5/5   pain                           OT Education - 08/25/20 1750    Education Details educated on completing self myofascial release with tennis ball to left arm. Discussed recommendation of referral to PT for dry needle evaluation.    Person(s) Educated Patient    Methods Explanation    Comprehension Verbalized understanding                      Plan - 08/25/20 1755    Clinical Impression Statement A: Patient is a 80 y/o with complaints of left upper arm pain which has  been ongoing for approximately 2 years. Reports that pain is present when performing certain arm movements. Pt demonstrates 5/5 shoulder strength when assessed although reports pain during IR/er when shoulder abducted. moderate trigger point palpated in the left inferior deltoid region/brachialis area. A/ROM is functional and patient is able to functional reaching ability. After evaluation, I recommended that a PT referral be obtained to assess for possible benefits of dry needling for trigger point in left upper arm. Discussed recommendation with patient and OT sent a referral to Dr. Luna Glasgow to sign if he approves. Patient verbalized understanding of recommendation.    OT Occupational Profile and History Detailed Assessment- Review of Records and additional review of physical, cognitive, psychosocial history related to current functional performance    Occupational performance deficits (Please refer to evaluation for details): ADL's;Rest and Sleep    Body Structure / Function / Physical Skills Pain;Fascial restriction    Rehab Potential Good    Clinical Decision Making Limited treatment options, no task modification necessary    Comorbidities Affecting Occupational Performance: May have comorbidities impacting occupational performance    Modification or Assistance to Complete Evaluation  No modification of tasks or assist necessary to complete eval    OT Frequency One time visit    OT Treatment/Interventions Other (comment)   No OT intervention recommended. Referral for PT recommended.   Plan P: One time visit. Referral for PT dry needling evaluation sent to Dr. Luna Glasgow  for approval.    Consulted and Agree with Plan of Care Patient           Patient will benefit from skilled therapeutic intervention in order to improve the following deficits and impairments:   Body Structure / Function / Physical Skills: Pain,Fascial restriction       Visit Diagnosis: Chronic left shoulder pain - Plan: Ot  plan of care cert/re-cert    Problem List Patient Active Problem List   Diagnosis Date Noted  . Right inguinal hernia   . Nocturia 01/08/2020  . Pancreatitis 11/11/2019  . HOH (hard of hearing)   . Recurrent epistaxis 06/01/2017  . Pleuritic chest pain 09/16/2016  . HTN (hypertension) 09/16/2016  . Herpes zoster without complication 46/50/3546  . Oral candidiasis 04/01/2016  . Prostate hypertrophy 03/06/2016  . Depression 04/06/2015  . Ulnar neuropathy 12/14/2014  . Leg swelling 12/05/2014  . Neuropathic pain 12/05/2014  . S/P spinal surgery 12/05/2014  . Venous stasis dermatitis 12/05/2014  . Insomnia 12/05/2014  . Generalized pain   . UTI (lower urinary tract infection) 11/11/2014  . Thrush of mouth and esophagus ([place]) 11/11/2014  . Urinary retention   . Urinary tract infectious disease   . Lumbar spondylosis 11/05/2014  . Colon neoplasm 06/11/2013  . GERD (gastroesophageal reflux disease) 01/19/2012  . PAC (premature atrial contraction) 09/15/2010  . Mixed hyperlipidemia 04/15/2008  . BEN HTN HEART DISEASE WITHOUT HEART FAIL 04/15/2008  . Coronary atherosclerosis 04/15/2008  . Abdominal aortic aneurysm ([place]) 04/15/2008  . COPD exacerbation ([place]) 04/15/2008  . History of MI (myocardial infarction) [place], OTR/L,CBIS  [phone]  08/26/2020, 9:47 AM  [place] [address] [place], [place], [address] Phone: [phone]   Fax:  [phone]  Name: [name] [name] MRN: [id number] Date of Birth: Sep 01, 1940

## 2020-08-28 ENCOUNTER — Other Ambulatory Visit: Payer: Self-pay

## 2020-08-28 ENCOUNTER — Encounter (HOSPITAL_COMMUNITY)
Admission: RE | Admit: 2020-08-28 | Discharge: 2020-08-28 | Disposition: A | Discharge status: Home / Self Care | Payer: Medicare Other | Source: Ambulatory Visit | Attending: Pulmonary Disease | Admitting: Pulmonary Disease

## 2020-08-28 DIAGNOSIS — J441 Chronic obstructive pulmonary disease with (acute) exacerbation: Secondary | ICD-10-CM

## 2020-08-28 NOTE — Progress Notes (Signed)
Daily Session Note  Patient Details  Name: [name] [name] MRN: [id number] Date of Birth: June 13, 1940 Referring Provider:   [place] from 08/08/2020 in [place]  Referring Provider Dr. Ander Slade      Encounter Date: 08/28/2020  Check In:  Session Check In - 08/28/20 1330      Check-In   Supervising physician immediately available to respond to emergencies [facility] MD immediately available    Physician(s) Dr. Harrington Challenger    Location [facility]    Staff Present Aundra Dubin, RN, BSN;Madison Audria Nine, MS, Exercise Physiologist    Virtual Visit No    Medication changes reported     No    Fall or balance concerns reported    No    Tobacco Cessation No Change    Warm-up and Cool-down Performed as group-led instruction    Resistance Training Performed Yes    VAD Patient? No    PAD/SET Patient? No      Pain Assessment   Currently in Pain? No/denies    Pain Score 0-No pain    Multiple Pain Sites No           Capillary Blood Glucose: No results found for this or any previous visit (from the past 24 hour(s)).    Social History   Tobacco Use  Smoking Status Former Smoker  . Packs/day: 1.00  . Years: 35.00  . Pack years: 35.00  . Types: Cigarettes  . Quit date: 03/28/2003  . Years since quitting: 17.4  Smokeless Tobacco Never Used    Goals Met:  Proper associated with RPD/PD & O2 Sat Independence with exercise equipment Improved SOB with ADL's Using PLB without cueing & demonstrates good technique Exercise tolerated well No report of cardiac concerns or symptoms Strength training completed today  Goals Unmet:  Not Applicable  Comments: Check out 1430.   Dr. Kathie Dike is Medical Director for [facility] Pulmonary Rehab.

## 2020-08-29 ENCOUNTER — Ambulatory Visit (HOSPITAL_COMMUNITY): Payer: Medicare Other | Admitting: Specialist

## 2020-09-02 ENCOUNTER — Other Ambulatory Visit: Payer: Self-pay

## 2020-09-02 ENCOUNTER — Encounter (HOSPITAL_COMMUNITY): Payer: Medicare Other

## 2020-09-02 ENCOUNTER — Encounter (HOSPITAL_COMMUNITY)
Admission: RE | Admit: 2020-09-02 | Discharge: 2020-09-02 | Disposition: A | Discharge status: Home / Self Care | Payer: Medicare Other | Source: Ambulatory Visit | Attending: Pulmonary Disease | Admitting: Pulmonary Disease

## 2020-09-02 VITALS — Wt 199.3 lb

## 2020-09-02 DIAGNOSIS — J441 Chronic obstructive pulmonary disease with (acute) exacerbation: Secondary | ICD-10-CM | POA: Diagnosis not present

## 2020-09-02 NOTE — Progress Notes (Signed)
Daily Session Note  Patient Details  Name: SHAQUELLE HERNON MRN: [id number] Date of Birth: Jul 21, 1940 Referring Provider:   [place] from 08/08/2020 in [place]  Referring Provider Dr. Ander Slade      Encounter Date: 09/02/2020  Check In:  Session Check In - 09/02/20 1330      Check-In   Supervising physician immediately available to respond to emergencies [facility] MD immediately available    Physician(s) Dr. Harl Bowie    Location [facility]    Staff Present Geanie [place], RN;Madison Audria Nine, MS, Exercise Physiologist;Synda Bagent Kris Mouton, MS, ACSM-CEP, Exercise Physiologist    Virtual Visit No    Medication changes reported     No    Fall or balance concerns reported    No    Tobacco Cessation No Change    Warm-up and Cool-down Performed as group-led instruction    Resistance Training Performed Yes    VAD Patient? No    PAD/SET Patient? No      Pain Assessment   Currently in Pain? No/denies    Pain Score 0-No pain    Multiple Pain Sites No           Capillary Blood Glucose: No results found for this or any previous visit (from the past 24 hour(s)).    Social History   Tobacco Use  Smoking Status Former Smoker  . Packs/day: 1.00  . Years: 35.00  . Pack years: 35.00  . Types: Cigarettes  . Quit date: 03/28/2003  . Years since quitting: 17.4  Smokeless Tobacco Never Used    Goals Met:  Independence with exercise equipment Exercise tolerated well No report of cardiac concerns or symptoms Strength training completed today  Goals Unmet:  Not Applicable  Comments: checkout time is 1430   Dr. Kathie Dike is Medical Director for [facility] Pulmonary Rehab.

## 2020-09-03 ENCOUNTER — Encounter (HOSPITAL_COMMUNITY): Payer: Medicare Other | Admitting: Occupational Therapy

## 2020-09-04 ENCOUNTER — Encounter (HOSPITAL_COMMUNITY)
Admission: RE | Admit: 2020-09-04 | Discharge: 2020-09-04 | Disposition: A | Discharge status: Home / Self Care | Payer: Medicare Other | Source: Ambulatory Visit | Attending: Pulmonary Disease | Admitting: Pulmonary Disease

## 2020-09-04 ENCOUNTER — Other Ambulatory Visit: Payer: Self-pay

## 2020-09-04 DIAGNOSIS — J441 Chronic obstructive pulmonary disease with (acute) exacerbation: Secondary | ICD-10-CM | POA: Diagnosis not present

## 2020-09-04 NOTE — Progress Notes (Signed)
Daily Session Note  Patient Details  Name: [name] [name] MRN: [id number] Date of Birth: 1940/05/24 Referring Provider:   Flowsheet Row PULMONARY REHAB OTHER RESP ORIENTATION from 08/08/2020 in [place]  Referring Provider Dr. Ander Slade      Encounter Date: 09/04/2020  Check In:  Session Check In - 09/04/20 1330      Check-In   Supervising physician immediately available to respond to emergencies [facility] MD immediately available    Physician(s) Dr. Harl Bowie    Location [facility]    Staff Present Geanie [place], RN;Adolfo Granieri Wynetta Emery, RN, BSN;Madison Audria Nine, MS, Exercise Physiologist    Virtual Visit No    Medication changes reported     No    Fall or balance concerns reported    No    Tobacco Cessation No Change    Warm-up and Cool-down Performed as group-led instruction    Resistance Training Performed Yes    VAD Patient? No    PAD/SET Patient? No      Pain Assessment   Currently in Pain? No/denies    Pain Score 0-No pain    Multiple Pain Sites No           Capillary Blood Glucose: No results found for this or any previous visit (from the past 24 hour(s)).    Social History   Tobacco Use  Smoking Status Former Smoker  . Packs/day: 1.00  . Years: 35.00  . Pack years: 35.00  . Types: Cigarettes  . Quit date: 03/28/2003  . Years since quitting: 17.4  Smokeless Tobacco Never Used    Goals Met:  Proper associated with RPD/PD & O2 Sat Independence with exercise equipment Improved SOB with ADL's Using PLB without cueing & demonstrates good technique Exercise tolerated well No report of cardiac concerns or symptoms Strength training completed today  Goals Unmet:  Not Applicable  Comments: Check out 1430.    Dr. Kathie Dike is Medical Director for [facility] Pulmonary Rehab.

## 2020-09-05 DIAGNOSIS — I2510 Atherosclerotic heart disease of native coronary artery without angina pectoris: Secondary | ICD-10-CM | POA: Diagnosis not present

## 2020-09-05 DIAGNOSIS — N182 Chronic kidney disease, stage 2 (mild): Secondary | ICD-10-CM | POA: Diagnosis not present

## 2020-09-05 DIAGNOSIS — I10 Essential (primary) hypertension: Secondary | ICD-10-CM | POA: Diagnosis not present

## 2020-09-06 LAB — LIPID PANEL
Chol/HDL Ratio: 3.5 ratio (ref 0.0–5.0)
Cholesterol, Total: 129 mg/dL (ref 100–199)
HDL: 37 mg/dL — ABNORMAL LOW (ref >39)
LDL Chol Calc (NIH): 78 mg/dL (ref 0–99)
Triglycerides: 70 mg/dL (ref 0–149)
VLDL Cholesterol Cal: 14 mg/dL (ref 5–40)

## 2020-09-06 LAB — CMP14+EGFR
ALT: 11 IU/L (ref 0–44)
AST: 14 IU/L (ref 0–40)
Albumin/Globulin Ratio: 1.6 (ref 1.2–2.2)
Albumin: 4.3 g/dL (ref 3.7–4.7)
Alkaline Phosphatase: 73 IU/L (ref 44–121)
BUN/Creatinine Ratio: 11 (ref 10–24)
BUN: 11 mg/dL (ref 8–27)
Bilirubin Total: 0.4 mg/dL (ref 0.0–1.2)
CO2: 26 mmol/L (ref 20–29)
Calcium: 9 mg/dL (ref 8.6–10.2)
Chloride: 102 mmol/L (ref 96–106)
Creatinine, Ser: 1.02 mg/dL (ref 0.76–1.27)
Globulin, Total: 2.7 g/dL (ref 1.5–4.5)
Glucose: 102 mg/dL — ABNORMAL HIGH (ref 65–99)
Potassium: 4.5 mmol/L (ref 3.5–5.2)
Sodium: 142 mmol/L (ref 134–144)
Total Protein: 7 g/dL (ref 6.0–8.5)
eGFR: 75 mL/min/{1.73_m2} (ref >59)

## 2020-09-06 LAB — CBC
Hematocrit: 41.3 % (ref 37.5–51.0)
Hemoglobin: 13.4 g/dL (ref 13.0–17.7)
MCH: 27.5 pg (ref 26.6–33.0)
MCHC: 32.4 g/dL (ref 31.5–35.7)
MCV: 85 fL (ref 79–97)
Platelets: 214 10*3/uL (ref 150–450)
RBC: 4.87 x10E6/uL (ref 4.14–5.80)
RDW: 13.8 % (ref 11.6–15.4)
WBC: 6.7 10*3/uL (ref 3.4–10.8)

## 2020-09-08 ENCOUNTER — Encounter (HOSPITAL_COMMUNITY): Payer: Medicare Other

## 2020-09-08 DIAGNOSIS — J449 Chronic obstructive pulmonary disease, unspecified: Secondary | ICD-10-CM | POA: Diagnosis not present

## 2020-09-08 DIAGNOSIS — E785 Hyperlipidemia, unspecified: Secondary | ICD-10-CM | POA: Diagnosis not present

## 2020-09-08 DIAGNOSIS — R809 Proteinuria, unspecified: Secondary | ICD-10-CM | POA: Diagnosis not present

## 2020-09-08 DIAGNOSIS — I2510 Atherosclerotic heart disease of native coronary artery without angina pectoris: Secondary | ICD-10-CM | POA: Diagnosis not present

## 2020-09-08 DIAGNOSIS — I129 Hypertensive chronic kidney disease with stage 1 through stage 4 chronic kidney disease, or unspecified chronic kidney disease: Secondary | ICD-10-CM | POA: Diagnosis not present

## 2020-09-08 DIAGNOSIS — N182 Chronic kidney disease, stage 2 (mild): Secondary | ICD-10-CM | POA: Diagnosis not present

## 2020-09-09 ENCOUNTER — Other Ambulatory Visit: Payer: Self-pay

## 2020-09-09 ENCOUNTER — Encounter (HOSPITAL_COMMUNITY)
Admission: RE | Admit: 2020-09-09 | Discharge: 2020-09-09 | Disposition: A | Discharge status: Home / Self Care | Payer: Medicare Other | Source: Ambulatory Visit | Attending: Pulmonary Disease | Admitting: Pulmonary Disease

## 2020-09-09 ENCOUNTER — Encounter (HOSPITAL_COMMUNITY): Payer: Medicare Other

## 2020-09-09 DIAGNOSIS — J441 Chronic obstructive pulmonary disease with (acute) exacerbation: Secondary | ICD-10-CM

## 2020-09-09 NOTE — Progress Notes (Signed)
Daily Session Note  Patient Details  Name: [name] [name] MRN: [id number] Date of Birth: 12-02-1940 Referring Provider:   [place] from 08/08/2020 in [place]  Referring Provider Dr. Ander Slade      Encounter Date: 09/09/2020  Check In:  Session Check In - 09/09/20 1330      Check-In   Supervising physician immediately available to respond to emergencies [facility] MD immediately available    Physician(s) Dr. Harl Bowie    Location [facility]    Staff Present Geanie [place], RN;Madison Audria Nine, MS, Exercise Physiologist;Lyrica Mcclarty Kris Mouton, MS, ACSM-CEP, Exercise Physiologist    Virtual Visit No    Medication changes reported     No    Fall or balance concerns reported    No    Tobacco Cessation No Change    Warm-up and Cool-down Performed as group-led instruction    Resistance Training Performed Yes    VAD Patient? No    PAD/SET Patient? No      Pain Assessment   Currently in Pain? No/denies    Pain Score 0-No pain    Multiple Pain Sites No           Capillary Blood Glucose: No results found for this or any previous visit (from the past 24 hour(s)).    Social History   Tobacco Use  Smoking Status Former Smoker  . Packs/day: 1.00  . Years: 35.00  . Pack years: 35.00  . Types: Cigarettes  . Quit date: 03/28/2003  . Years since quitting: 17.4  Smokeless Tobacco Never Used    Goals Met:  Independence with exercise equipment Exercise tolerated well No report of cardiac concerns or symptoms Strength training completed today  Goals Unmet:  Not Applicable  Comments: checkout time is 1430   Dr. Kathie Dike is Medical Director for [facility] Pulmonary Rehab.

## 2020-09-11 ENCOUNTER — Ambulatory Visit: Payer: Medicare Other | Admitting: Orthopaedic Surgery

## 2020-09-11 ENCOUNTER — Other Ambulatory Visit: Payer: Self-pay

## 2020-09-11 ENCOUNTER — Encounter (HOSPITAL_COMMUNITY)
Admission: RE | Admit: 2020-09-11 | Discharge: 2020-09-11 | Disposition: A | Discharge status: Home / Self Care | Payer: Medicare Other | Source: Ambulatory Visit | Attending: Pulmonary Disease | Admitting: Pulmonary Disease

## 2020-09-11 DIAGNOSIS — J441 Chronic obstructive pulmonary disease with (acute) exacerbation: Secondary | ICD-10-CM | POA: Diagnosis not present

## 2020-09-11 NOTE — Progress Notes (Signed)
Daily Session Note  Patient Details  Name: [name] [name] MRN: [id number] Date of Birth: 1940-10-07 Referring Provider:   [place] from 08/08/2020 in [place]  Referring Provider Dr. Ander Slade      Encounter Date: 09/11/2020  Check In:  Session Check In - 09/11/20 1330      Check-In   Supervising physician immediately available to respond to emergencies [facility] MD immediately available    Physician(s) Dr. Domenic Polite    Location [facility]    Staff Present Geanie [place], RN;Affan Callow Audria Nine, MS, Exercise Physiologist    Virtual Visit No    Medication changes reported     No    Fall or balance concerns reported    No    Tobacco Cessation No Change    Warm-up and Cool-down Performed as group-led instruction    Resistance Training Performed Yes    VAD Patient? No    PAD/SET Patient? No      Pain Assessment   Currently in Pain? No/denies    Pain Score 0-No pain    Multiple Pain Sites No           Capillary Blood Glucose: No results found for this or any previous visit (from the past 24 hour(s)).    Social History   Tobacco Use  Smoking Status Former Smoker  . Packs/day: 1.00  . Years: 35.00  . Pack years: 35.00  . Types: Cigarettes  . Quit date: 03/28/2003  . Years since quitting: 17.4  Smokeless Tobacco Never Used    Goals Met:  Independence with exercise equipment Exercise tolerated well No report of cardiac concerns or symptoms Strength training completed today  Goals Unmet:  Not Applicable  Comments: check out 1430   Dr. Kathie Dike is Medical Director for [facility] Pulmonary Rehab.

## 2020-09-12 ENCOUNTER — Encounter (HOSPITAL_COMMUNITY): Payer: Medicare Other

## 2020-09-16 ENCOUNTER — Other Ambulatory Visit: Payer: Self-pay

## 2020-09-16 ENCOUNTER — Encounter (HOSPITAL_COMMUNITY)
Admission: RE | Admit: 2020-09-16 | Discharge: 2020-09-16 | Disposition: A | Discharge status: Home / Self Care | Payer: Medicare Other | Source: Ambulatory Visit | Attending: Pulmonary Disease | Admitting: Pulmonary Disease

## 2020-09-16 VITALS — Wt 199.1 lb

## 2020-09-16 DIAGNOSIS — J441 Chronic obstructive pulmonary disease with (acute) exacerbation: Secondary | ICD-10-CM | POA: Diagnosis not present

## 2020-09-16 NOTE — Progress Notes (Signed)
Daily Session Note  Patient Details  Name: [name] [name] MRN: [id number] Date of Birth: 1941-05-01 Referring Provider:   Flowsheet Row PULMONARY REHAB OTHER RESP ORIENTATION from 08/08/2020 in [place]  Referring Provider Dr. Ander Slade      Encounter Date: 09/16/2020  Check In:  Session Check In - 09/16/20 1330      Check-In   Supervising physician immediately available to respond to emergencies [facility] MD immediately available    Physician(s) Dr. Harl Bowie    Location [facility]    Staff Present Cathren Harsh, MS, Exercise Physiologist;Stavroula Rohde Kris Mouton, MS, ACSM-CEP, Exercise Physiologist    Virtual Visit No    Medication changes reported     No    Fall or balance concerns reported    No    Tobacco Cessation No Change    Warm-up and Cool-down Performed as group-led instruction    Resistance Training Performed Yes    VAD Patient? No    PAD/SET Patient? No      Pain Assessment   Currently in Pain? No/denies    Pain Score 0-No pain    Multiple Pain Sites No           Capillary Blood Glucose: No results found for this or any previous visit (from the past 24 hour(s)).    Social History   Tobacco Use  Smoking Status Former Smoker  . Packs/day: 1.00  . Years: 35.00  . Pack years: 35.00  . Types: Cigarettes  . Quit date: 03/28/2003  . Years since quitting: 17.4  Smokeless Tobacco Never Used    Goals Met:  Independence with exercise equipment Exercise tolerated well No report of cardiac concerns or symptoms Strength training completed today  Goals Unmet:  Not Applicable  Comments: checkout time is 1430   Dr. Kathie Dike is Medical Director for [facility].

## 2020-09-17 ENCOUNTER — Ambulatory Visit (HOSPITAL_COMMUNITY): Payer: Medicare Other | Admitting: Physical Therapy

## 2020-09-18 ENCOUNTER — Encounter (HOSPITAL_COMMUNITY): Payer: Medicare Other

## 2020-09-18 ENCOUNTER — Ambulatory Visit (INDEPENDENT_AMBULATORY_CARE_PROVIDER_SITE_OTHER): Payer: Medicare Other | Admitting: Pulmonary Disease

## 2020-09-18 ENCOUNTER — Encounter: Payer: Self-pay | Admitting: Pulmonary Disease

## 2020-09-18 ENCOUNTER — Other Ambulatory Visit: Payer: Self-pay

## 2020-09-18 VITALS — BP 110/74 | HR 80 | Temp 98.3°F | Ht 73.0 in | Wt 200.1 lb

## 2020-09-18 DIAGNOSIS — J410 Simple chronic bronchitis: Secondary | ICD-10-CM | POA: Diagnosis not present

## 2020-09-18 NOTE — Progress Notes (Signed)
History of Present Illness [name] [name] is a 80 y.o. male former  smoker ( quit 2004 with a 35 pack year smoking history) with COPD with chronic bronchitis and emphysema.  Maintenance medication  is Flovent Rescue is Albuterol  treated for mild exacerbation during last visit  Has been participating in pulmonary rehab and seems to be benefiting from it Compliant with his inhalers-on Flovent and albuterol nebulizations  Last seen January 31 Pt. Presents for follow up after  flare of bronchitis with  yellow secretions. He was seen in the office 05/26/2020 and was treated with a z pack and Mucinex. I asked him to use his flutter valve 3-4 times daily to help with his chest congestion. As he was not wheezing we did not treat with prednisone. CXR done 1/10 showed COPD with chronic bronchitis and emphysema, but no acute issues.   He presents today stating he is about 75%  better. He does still have a cough but this is much better. Secretions are less in volume. They are thick and they remain yellow in color. He has stopped using his Mucinex and Flutter valve as he was feeling better. We have discussed continuing this until his secretions are thinner. He denies any fever, chest pain, orthopnea or hemoptysis.    Test Results: CXR 05/26/2020 Emphysematous changes. No pneumothorax or pleural effusion. No focal consolidation. Left hemidiaphragm eventration, unchanged. Stable cardiomediastinal silhouette. No acute osseous abnormality. No focal consolidation.  Emphysema.  CBC Latest Ref Rng & Units 09/05/2020 03/18/2020 12/28/2019  WBC 3.4 - 10.8 x10E3/uL 6.7 10.3 6.9  Hemoglobin 13.0 - 17.7 g/dL 13.4 13.4 13.5  Hematocrit 37.5 - 51.0 % 41.3 42.9 42.7  Platelets 150 - 450 x10E3/uL 214 214 231    BMP Latest Ref Rng & Units 09/05/2020 03/18/2020 12/28/2019  Glucose 65 - 99 mg/dL 102(H) 110(H) 115(H)  BUN 8 - 27 mg/dL 11 11 13   Creatinine 0.76 - 1.27 mg/dL 1.02 0.88 0.76  BUN/Creat Ratio 10 - 24 11 - -   Sodium 134 - 144 mmol/L 142 135 138  Potassium 3.5 - 5.2 mmol/L 4.5 4.0 3.9  Chloride 96 - 106 mmol/L 102 101 102  CO2 20 - 29 mmol/L 26 26 29   Calcium 8.6 - 10.2 mg/dL 9.0 8.7(L) 9.0    BNP    Component Value Date/Time   BNP 44.0 09/16/2016 0235    ProBNP No results found for: PROBNP  PFT    Component Value Date/Time   FEV1PRE 1.58 11/05/2019 1342   FEV1POST 1.72 11/05/2019 1342   FVCPRE 2.80 11/05/2019 1342   FVCPOST 2.82 11/05/2019 1342   DLCOUNC 32.71 11/05/2019 1342   PREFEV1FVCRT 56 11/05/2019 1342   PSTFEV1FVCRT 61 11/05/2019 1342    No results found.   Past medical hx Past Medical History:  Diagnosis Date  . AAA (abdominal aortic aneurysm) ([place])    needs yearly ultrasound  . Allergy   . Anemia   . Arthritis   . Asthma   . BCC (basal cell carcinoma) 08/18/1989   left shoulder blad, upper right arm, left arm beyond elbow, c&d  . BCC (basal cell carcinoma) 01/31/1992   Posterior neck, curetx3, 43fu  . BCC (basal cell carcinoma) 11/22/2001   mid forehead, cx3, excision, right forearm, cx3, 7fu  . BCC (basal cell carcinoma) 10/09/2003   mid forehead, MOHs  . BCC (basal cell carcinoma) 08/15/2008   upper left back, biopsy  . BPH (benign prostatic hyperplasia)   . CAD (coronary artery  disease)   . Cancer ([place])    skin cancer  . COPD (chronic obstructive pulmonary disease) ([place])   . Dysrhythmia    pt. states it can be fast at times  . GERD (gastroesophageal reflux disease)   . Glaucoma   . HOH (hard of hearing)   . Hypercholesterolemia   . Hypertension   . Impaired fasting glucose   . Low back pain   . Melanoma in situ ([place]) 10/09/2003   left chin, MOHs  . MI (myocardial infarction) ([place]) 1999  . SCC (squamous cell carcinoma) 07/03/2014   in situ, behind left ear, cx3, cautery, 82fu  . SCC (squamous cell carcinoma) 07/03/2014   well diff, left forearm, biopsy, cx1, cautery  . SCC (squamous cell carcinoma) 07/20/2017   in situ, left upper arm,  cx3, 38fu  . SCC (squamous cell carcinoma) 01/10/2019   in situ, left post shoulder, cx3, 45fu  . SCC (squamous cell carcinoma) 11/22/2001   left forearm distal, left forearm, cx3, 51fu  . SCC (squamous cell carcinoma) 10/09/2003   [place], left ear post, clear per st, right cheek clear  . SCC (squamous cell carcinoma) 03/30/2004   in situ, left upper arm, cx3, 54fu  . SCC (squamous cell carcinoma) 03/08/2005   in situ, right cheek, mid upper forehead, cx3, 71fu  . SCC (squamous cell carcinoma) 06/08/2006   in situ, left shoulder, cx3, 81fu  . SCC (squamous cell carcinoma) 05/05/2010   right inner wrist, biopsy  . SCC (squamous cell carcinoma) 09/13/2013   in situ, right crown scalp, front scalp, biopsy  . Thrush      Social History   Tobacco Use  . Smoking status: Former Smoker    Packs/day: 1.00    Years: 35.00    Pack years: 35.00    Types: Cigarettes    Quit date: 03/28/2003    Years since quitting: 17.4  . Smokeless tobacco: Never Used  Vaping Use  . Vaping Use: Never used  Substance Use Topics  . Alcohol use: No    Alcohol/week: 0.0 standard drinks  . Drug use: No    [name] [name] reports that he quit smoking about 17 years ago. His smoking use included cigarettes. He has a 35.00 pack-year smoking history. He has never used smokeless tobacco. He reports that he does not drink alcohol and does not use drugs.  Tobacco Cessation: Former smoker , Quit 2004 with a 35 pack year smoking history  Past surgical hx, Family hx, Social hx all reviewed.  Current Outpatient Medications on File Prior to Visit  Medication Sig  . albuterol (PROVENTIL) (2.5 MG/3ML) 0.083% nebulizer solution Take 3 mLs (2.5 mg total) by nebulization every 6 (six) hours as needed for wheezing or shortness of breath.  Marland Kitchen albuterol (VENTOLIN HFA) 108 (90 Base) MCG/ACT inhaler 2 puffs as needed  . alfuzosin (UROXATRAL) 10 MG 24 hr tablet Take 1 tablet (10 mg total) by mouth daily with breakfast.  . cetirizine  (ZYRTEC) 10 MG tablet Take 10 mg by mouth daily.  . dorzolamide-timolol (COSOPT) 22.3-6.8 MG/ML ophthalmic solution Place 1 drop into both eyes 2 (two) times daily.   Marland Kitchen esomeprazole (NEXIUM) 20 MG capsule Take 20 mg by mouth daily at 12 noon.  Marland Kitchen esomeprazole (NEXIUM) 40 MG capsule Take 40 mg by mouth daily.  . fluticasone (FLOVENT HFA) 220 MCG/ACT inhaler Inhale 2 puffs into the lungs daily. (Patient taking differently: Inhale 2 puffs into the lungs in the morning and at bedtime.)  . hydroxypropyl  methylcellulose / hypromellose (ISOPTO TEARS / GONIOVISC) 2.5 % ophthalmic solution Place 1 drop into both eyes 3 (three) times daily as needed for dry eyes.  Marland Kitchen LORazepam (ATIVAN) 0.5 MG tablet Take 1 tablet (0.5 mg total) by mouth at bedtime.  Marland Kitchen losartan (COZAAR) 25 MG tablet Take 1 tablet (25 mg total) by mouth daily.  Marland Kitchen lovastatin (MEVACOR) 40 MG tablet Take 1 tablet (40 mg total) by mouth daily.  . nitroGLYCERIN (NITROSTAT) 0.4 MG SL tablet Place 1 tablet (0.4 mg total) under the tongue every 5 (five) minutes as needed for chest pain.  . pseudoephedrine-guaifenesin (MUCINEX D) 60-600 MG 12 hr tablet Take 1 tablet by mouth 2 (two) times daily as needed for congestion.  . sodium chloride (OCEAN) 0.65 % SOLN nasal spray Place 1 spray into both nostrils as needed for congestion.   No current facility-administered medications on file prior to visit.     Allergies  Allergen Reactions  . Beta Adrenergic Blockers Diarrhea and Other (See Comments)    Does not recall an allergy  . Lasix [Furosemide] Rash  . Cefzil [Cefprozil] Nausea Only    Patient does recall allergy  . Dexamethasone Swelling  . Gabapentin Swelling  . Neomycin Other (See Comments)  . Tetracyclines & Related Itching  . Ciprofloxacin Nausea And Vomiting, Rash and Other (See Comments)    Body aches   . Methocarbamol Swelling and Rash  . Penicillins Swelling and Rash    Has patient had a PCN reaction causing immediate rash,  facial/tongue/throat swelling, SOB or lightheadedness with hypotension: yes Has patient had a PCN reaction causing severe rash involving mucus membranes or skin necrosis: unknown Has patient had a PCN reaction that required hospitalization: no Has patient had a PCN reaction occurring within the last 10 years: No If all of the above answers are "NO", then may proceed with Cephalosporin use.     Review Of Systems:  Constitutional:   No  weight loss, night sweats,  Fevers, chills, fatigue, or  lassitude.  HEENT:   No headaches,  Difficulty swallowing,  Tooth/dental problems, or  Sore throat,                No sneezing, itching, ear ache, nasal congestion, post nasal drip,   CV:  No chest pain,  Orthopnea, PND, swelling in lower extremities, anasarca, dizziness, palpitations, syncope.   GI  No heartburn, indigestion, abdominal pain, nausea, vomiting, diarrhea, change in bowel habits, loss of appetite, bloody stools.   Resp: No shortness of breath with exertion or at rest.  Slight increase over baseline  mucus, + productive cough,  No non-productive cough,  No coughing up of blood.  No change in color of mucus.  No wheezing.  No chest wall deformity, secretions are thick  Skin: no rash or lesions.  GU: no dysuria, change in color of urine, no urgency or frequency.  No flank pain, no hematuria   MS:  No joint pain or swelling.  No decreased range of motion.  No back pain.  Psych:  No change in mood or affect. No depression or anxiety.  No memory loss.   Vital Signs BP 110/74 (BP Location: Left Arm, Cuff Size: Normal)   Pulse 80   Temp 98.3 F (36.8 C) (Oral)   Ht 6\' 1"  (1.854 m)   Wt 200 lb 1.6 oz (90.8 kg)   SpO2 96%   BMI 26.40 kg/m    Physical Exam:  Appearance - well kempt  ENMT -moist  oral mucosa  neck -supple, no JVD or thyromegaly Respiratory -clear breath sounds CV - s1s2 GI -soft, nontender Lymph - no adenopathy noted in neck and axillary areas MSK - normal  muscle strength and tone, normal gait   Assessment/Plan COPD -Appears stable at present -We will continue bronchodilators and Flovent -Was recently treated with a course of steroids  He continues to participate in pulmonary rehab and seems to be doing relatively well  We discussed about what to be on the look out for with respect to an exacerbation  How to manage exacerbations was discussed  I will see him back in about 3 months  Encouraged to call with any significant concerns  I spent 30 minutes dedicated to the care of this patient on the date of this encounter to include previsit review of records, face-to-face time with the patient discussing conditions above, post visit ordering of testing, clinical documentation with electronic health record and communicated necessary findings to members of the patient's care team  Laurin Coder, MD 09/18/2020  12:00 PM

## 2020-09-18 NOTE — Patient Instructions (Signed)
No changes to your treatment regimen Continue using nebulization treatment on Flovent twice a day  Mucinex as needed  Call with any significant concerns  I will see you in about 4 months from now

## 2020-09-18 NOTE — Progress Notes (Signed)
Pulmonary Individual Treatment Plan  Patient Details  Name: [name] [name] MRN: [id number] Date of Birth: [birth year] Referring Provider:   Flowsheet Row PULMONARY REHAB OTHER RESP ORIENTATION from 08/08/2020 in [place]  Referring Provider Dr. Ander Slade      Initial Encounter Date:  Flowsheet Row PULMONARY REHAB OTHER RESP ORIENTATION from 08/08/2020 in [place]  Date 08/08/20      Visit Diagnosis: COPD with acute exacerbation ([facility])  Patient's Home Medications on Admission:   Current Outpatient Medications:  .  albuterol (PROAIR HFA) 108 (90 Base) MCG/ACT inhaler, INHALE 2 PUFFS INTO THE LUNGS EVERY SIX HOURS AS NEEDED FOR WHEEZING., Disp: 8.5 g, Rfl: 2 .  albuterol (PROVENTIL) (2.5 MG/3ML) 0.083% nebulizer solution, Take 3 mLs (2.5 mg total) by nebulization every 6 (six) hours as needed for wheezing or shortness of breath. (Patient not taking: No sig reported), Disp: 100 mL, Rfl: 2 .  albuterol (VENTOLIN HFA) 108 (90 Base) MCG/ACT inhaler, 2 puffs as needed, Disp: , Rfl:  .  alfuzosin (UROXATRAL) 10 MG 24 hr tablet, Take 1 tablet (10 mg total) by mouth daily with breakfast., Disp: 90 tablet, Rfl: 3 .  cetirizine (ZYRTEC) 10 MG tablet, Take 10 mg by mouth daily., Disp: , Rfl:  .  dorzolamide-timolol (COSOPT) 22.3-6.8 MG/ML ophthalmic solution, Place 1 drop into both eyes 2 (two) times daily. , Disp: , Rfl:  .  esomeprazole (NEXIUM) 20 MG capsule, Take 20 mg by mouth daily at 12 noon., Disp: , Rfl:  .  esomeprazole (NEXIUM) 40 MG capsule, Take 40 mg by mouth daily. (Patient not taking: No sig reported), Disp: , Rfl:  .  fluticasone (FLOVENT HFA) 220 MCG/ACT inhaler, Inhale 2 puffs into the lungs daily., Disp: 12 g, Rfl: 5 .  hydroxypropyl methylcellulose / hypromellose (ISOPTO TEARS / GONIOVISC) 2.5 % ophthalmic solution, Place 1 drop into both eyes 3 (three) times daily as needed for dry eyes., Disp: , Rfl:  .  LORazepam (ATIVAN) 0.5 MG  tablet, Take 1 tablet (0.5 mg total) by mouth at bedtime., Disp: 30 tablet, Rfl: 3 .  losartan (COZAAR) 25 MG tablet, Take 1 tablet (25 mg total) by mouth daily., Disp: 90 tablet, Rfl: 1 .  lovastatin (MEVACOR) 40 MG tablet, Take 1 tablet (40 mg total) by mouth daily., Disp: 90 tablet, Rfl: 1 .  nitroGLYCERIN (NITROSTAT) 0.4 MG SL tablet, Place 1 tablet (0.4 mg total) under the tongue every 5 (five) minutes as needed for chest pain., Disp: 25 tablet, Rfl: 5 .  pseudoephedrine-guaifenesin (MUCINEX D) 60-600 MG 12 hr tablet, Take 1 tablet by mouth 2 (two) times daily as needed for congestion., Disp: , Rfl:  .  sodium chloride (OCEAN) 0.65 % SOLN nasal spray, Place 1 spray into both nostrils as needed for congestion., Disp: , Rfl:   Past Medical History: Past Medical History:  Diagnosis Date  . AAA (abdominal aortic aneurysm) ([place])    needs yearly ultrasound  . Allergy   . Anemia   . Arthritis   . Asthma   . BCC (basal cell carcinoma) 08/18/1989   left shoulder blad, upper right arm, left arm beyond elbow, c&d  . BCC (basal cell carcinoma) 01/31/1992   Posterior neck, curetx3, 85fu  . BCC (basal cell carcinoma) 11/22/2001   mid forehead, cx3, excision, right forearm, cx3, 54fu  . BCC (basal cell carcinoma) 10/09/2003   mid forehead, MOHs  . BCC (basal cell carcinoma) 08/15/2008   upper left back, biopsy  .  BPH (benign prostatic hyperplasia)   . CAD (coronary artery disease)   . Cancer ([place])    skin cancer  . COPD (chronic obstructive pulmonary disease) ([place])   . Dysrhythmia    pt. states it can be fast at times  . GERD (gastroesophageal reflux disease)   . Glaucoma   . HOH (hard of hearing)   . Hypercholesterolemia   . Hypertension   . Impaired fasting glucose   . Low back pain   . Melanoma in situ ([place]) 10/09/2003   left chin, MOHs  . MI (myocardial infarction) ([place]) 1999  . SCC (squamous cell carcinoma) 07/03/2014   in situ, behind left ear, cx3, cautery, 68fu  . SCC  (squamous cell carcinoma) 07/03/2014   well diff, left forearm, biopsy, cx1, cautery  . SCC (squamous cell carcinoma) 07/20/2017   in situ, left upper arm, cx3, 91fu  . SCC (squamous cell carcinoma) 01/10/2019   in situ, left post shoulder, cx3, 5fu  . SCC (squamous cell carcinoma) 11/22/2001   left forearm distal, left forearm, cx3, 45fu  . SCC (squamous cell carcinoma) 10/09/2003   [place], left ear post, clear per st, right cheek clear  . SCC (squamous cell carcinoma) 03/30/2004   in situ, left upper arm, cx3, 25fu  . SCC (squamous cell carcinoma) 03/08/2005   in situ, right cheek, mid upper forehead, cx3, 15fu  . SCC (squamous cell carcinoma) 06/08/2006   in situ, left shoulder, cx3, 29fu  . SCC (squamous cell carcinoma) 05/05/2010   right inner wrist, biopsy  . SCC (squamous cell carcinoma) 09/13/2013   in situ, right crown scalp, front scalp, biopsy  . Thrush     Tobacco Use: Social History   Tobacco Use  Smoking Status Former Smoker  . Packs/day: 1.00  . Years: 35.00  . Pack years: 35.00  . Types: Cigarettes  . Quit date: 03/28/2003  . Years since quitting: 17.4  Smokeless Tobacco Never Used    Labs: Recent Chemical engineer    Labs for ITP Cardiac and Pulmonary Rehab Latest Ref Rng & Units 06/23/2017 02/14/2019 09/05/2019 11/12/2019 09/05/2020   Cholestrol 100 - 199 mg/dL 122 117 124 107 129   LDLCALC 0 - 99 mg/dL 67 61 63 61 78   HDL >39 mg/dL 43 38(L) 46 35(L) 37(L)   Trlycerides 0 - 149 mg/dL 61 93 71 54 70      Capillary Blood Glucose: Lab Results  Component Value Date   GLUCAP 97 09/17/2016   GLUCAP 145 (H) 09/16/2016     Pulmonary Assessment Scores:  Pulmonary Assessment Scores    Row Name 08/08/20 1254         ADL UCSD   SOB Score total 22           CAT Score   CAT Score 19           mMRC Score   mMRC Score 2           UCSD: Self-administered rating of dyspnea associated with activities of daily living (ADLs) 6-point scale (0 = "not  at all" to 5 = "maximal or unable to do because of breathlessness")  Scoring Scores range from 0 to 120.  Minimally important difference is 5 units  CAT: CAT can identify the health impairment of COPD patients and is better correlated with disease progression.  CAT has a scoring range of zero to 40. The CAT score is classified into four groups of low (less than 10), medium (10 -  20), high (21-30) and very high (31-40) based on the impact level of disease on health status. A CAT score over 10 suggests significant symptoms.  A worsening CAT score could be explained by an exacerbation, poor medication adherence, poor inhaler technique, or progression of COPD or comorbid conditions.  CAT MCID is 2 points  mMRC: mMRC (Modified Medical Research Council) Dyspnea Scale is used to assess the degree of baseline functional disability in patients of respiratory disease due to dyspnea. No minimal important difference is established. A decrease in score of 1 point or greater is considered a positive change.   Pulmonary Function Assessment:   Exercise Target Goals: Exercise Program Goal: Individual exercise prescription set using results from initial 6 min walk test and THRR while considering  patient's activity barriers and safety.   Exercise Prescription Goal: Initial exercise prescription builds to 30-45 minutes a day of aerobic activity, 2-3 days per week.  Home exercise guidelines will be given to patient during program as part of exercise prescription that the participant will acknowledge.  Activity Barriers & Risk Stratification:  Activity Barriers & Cardiac Risk Stratification - 08/08/20 1253      Activity Barriers & Cardiac Risk Stratification   Activity Barriers Arthritis;Back Problems;Neck/Spine Problems;Deconditioning;Muscular Weakness;Shortness of Breath    Cardiac Risk Stratification High           6 Minute Walk:  6 Minute Walk    Row Name 08/08/20 1428         6 Minute Walk    Phase Initial     Distance 1200 feet     Walk Time 6 minutes     # of Rest Breaks 0     MPH 2.27     METS 2.43     RPE 11     Perceived Dyspnea  13     VO2 Peak 8.51     Resting HR 72 bpm     Resting BP 112/74     Resting Oxygen Saturation  96 %     Exercise Oxygen Saturation  during 6 min walk 91 %     Max Ex. HR 99 bpm     Max Ex. BP 128/64     2 Minute Post BP 106/70           Interval HR   1 Minute HR 96     2 Minute HR 99     3 Minute HR 97     4 Minute HR 93     5 Minute HR 96     6 Minute HR 96     2 Minute Post HR 83     Interval Heart Rate? Yes           Interval Oxygen   Interval Oxygen? Yes     Baseline Oxygen Saturation % 96 %     1 Minute Oxygen Saturation % 93 %     1 Minute Liters of Oxygen 0 L     2 Minute Oxygen Saturation % 91 %     2 Minute Liters of Oxygen 0 L     3 Minute Oxygen Saturation % 92 %     3 Minute Liters of Oxygen 0 L     4 Minute Oxygen Saturation % 91 %     4 Minute Liters of Oxygen 0 L     5 Minute Oxygen Saturation % 92 %     5 Minute Liters of Oxygen 0 L  6 Minute Oxygen Saturation % 92 %     6 Minute Liters of Oxygen 0 L     2 Minute Post Oxygen Saturation % 96 %     2 Minute Post Liters of Oxygen 0 L            Oxygen Initial Assessment:  Oxygen Initial Assessment - 08/08/20 1433      Initial 6 min Walk   Oxygen Used None      Program Oxygen Prescription   Program Oxygen Prescription None      Intervention   Short Term Goals To learn and exhibit compliance with exercise, home and travel O2 prescription;To learn and understand importance of monitoring SPO2 with pulse oximeter and demonstrate accurate use of the pulse oximeter.;To learn and understand importance of maintaining oxygen saturations>88%;To learn and demonstrate proper pursed lip breathing techniques or other breathing techniques.;To learn and demonstrate proper use of respiratory medications    Long  Term Goals Exhibits compliance with exercise, home  and travel O2 prescription;Verbalizes importance of monitoring SPO2 with pulse oximeter and return demonstration;Maintenance of O2 saturations>88%;Exhibits proper breathing techniques, such as pursed lip breathing or other method taught during program session;Compliance with respiratory medication           Oxygen Re-Evaluation:  Oxygen Re-Evaluation    Row Name 08/19/20 1613 09/16/20 1412           Program Oxygen Prescription   Program Oxygen Prescription None None             Home Oxygen   Home Oxygen Device None None      Sleep Oxygen Prescription None None      Home Exercise Oxygen Prescription None None      Home Resting Oxygen Prescription None None      Compliance with Home Oxygen Use Yes Yes             Goals/Expected Outcomes   Short Term Goals To learn and exhibit compliance with exercise, home and travel O2 prescription;To learn and understand importance of monitoring SPO2 with pulse oximeter and demonstrate accurate use of the pulse oximeter.;To learn and understand importance of maintaining oxygen saturations>88%;To learn and demonstrate proper pursed lip breathing techniques or other breathing techniques.;To learn and demonstrate proper use of respiratory medications To learn and exhibit compliance with exercise, home and travel O2 prescription;To learn and understand importance of monitoring SPO2 with pulse oximeter and demonstrate accurate use of the pulse oximeter.;To learn and understand importance of maintaining oxygen saturations>88%;To learn and demonstrate proper pursed lip breathing techniques or other breathing techniques.;To learn and demonstrate proper use of respiratory medications      Long  Term Goals Exhibits compliance with exercise, home and travel O2 prescription;Verbalizes importance of monitoring SPO2 with pulse oximeter and return demonstration;Maintenance of O2 saturations>88%;Exhibits proper breathing techniques, such as pursed lip breathing or  other method taught during program session;Compliance with respiratory medication Exhibits compliance with exercise, home and travel O2 prescription;Verbalizes importance of monitoring SPO2 with pulse oximeter and return demonstration;Maintenance of O2 saturations>88%;Exhibits proper breathing techniques, such as pursed lip breathing or other method taught during program session;Compliance with respiratory medication      Goals/Expected Outcomes compliance compliance             Oxygen Discharge (Final Oxygen Re-Evaluation):  Oxygen Re-Evaluation - 09/16/20 1412      Program Oxygen Prescription   Program Oxygen Prescription None      Home Oxygen   Home Oxygen Device  None    Sleep Oxygen Prescription None    Home Exercise Oxygen Prescription None    Home Resting Oxygen Prescription None    Compliance with Home Oxygen Use Yes      Goals/Expected Outcomes   Short Term Goals To learn and exhibit compliance with exercise, home and travel O2 prescription;To learn and understand importance of monitoring SPO2 with pulse oximeter and demonstrate accurate use of the pulse oximeter.;To learn and understand importance of maintaining oxygen saturations>88%;To learn and demonstrate proper pursed lip breathing techniques or other breathing techniques.;To learn and demonstrate proper use of respiratory medications    Long  Term Goals Exhibits compliance with exercise, home and travel O2 prescription;Verbalizes importance of monitoring SPO2 with pulse oximeter and return demonstration;Maintenance of O2 saturations>88%;Exhibits proper breathing techniques, such as pursed lip breathing or other method taught during program session;Compliance with respiratory medication    Goals/Expected Outcomes compliance           Initial Exercise Prescription:  Initial Exercise Prescription - 08/08/20 1400      Date of Initial Exercise RX and Referring Provider   Date 08/08/20    Referring Provider Dr. Ander Slade     Expected Discharge Date 12/11/20      NuStep   Level 1    SPM 80    Minutes 22      Arm Ergometer   Level 1    RPM 45    Minutes 17      Prescription Details   Frequency (times per week) 2    Duration Progress to 30 minutes of continuous aerobic without signs/symptoms of physical distress      Intensity   THRR 40-80% of Max Heartrate 57-114    Ratings of Perceived Exertion 11-13    Perceived Dyspnea 0-4      Resistance Training   Training Prescription Yes    Weight 3 lbs    Reps 10-15           Perform Capillary Blood Glucose checks as needed.  Exercise Prescription Changes:   Exercise Prescription Changes    Row Name 08/19/20 1600 09/02/20 1400 09/16/20 1400         Response to Exercise   Blood Pressure (Admit) 110/68 112/60 112/68     Blood Pressure (Exercise) 122/70 124/64 118/70     Blood Pressure (Exit) 104/70 100/52 108/70     Heart Rate (Admit) 92 bpm 81 bpm 76 bpm     Heart Rate (Exercise) 93 bpm 95 bpm 87 bpm     Heart Rate (Exit) 87 bpm 85 bpm 78 bpm     Oxygen Saturation (Admit) 95 % 196 % 95 %     Oxygen Saturation (Exercise) 93 % 95 % 94 %     Oxygen Saturation (Exit) 95 % 95 % 97 %     Rating of Perceived Exertion (Exercise) 14 12 11      Perceived Dyspnea (Exercise) 12 12 11      Duration Continue with 30 min of aerobic exercise without signs/symptoms of physical distress. Continue with 30 min of aerobic exercise without signs/symptoms of physical distress. Continue with 30 min of aerobic exercise without signs/symptoms of physical distress.     Intensity THRR unchanged THRR unchanged THRR unchanged           Progression   Progression Continue to progress workloads to maintain intensity without signs/symptoms of physical distress. Continue to progress workloads to maintain intensity without signs/symptoms of physical distress. Continue to progress workloads  to maintain intensity without signs/symptoms of physical distress.            Resistance Training   Training Prescription Yes Yes Yes     Weight 3 lbs 4 lbs 4 lbs     Reps 10-15 10-15 10-15     Time -- 10 Minutes 10 Minutes           NuStep   Level 1 1 2      SPM 84 89 84     Minutes 22 22 22      METs 2 2.1 2           Arm Ergometer   Level 1 1.5 1.5     RPM 53 52 57     Minutes 17 17 17      METs 1.8 1.8 2.1            Exercise Comments:   Exercise Goals and Review:   Exercise Goals    Row Name 08/08/20 1432 08/19/20 1616 09/16/20 1412         Exercise Goals   Increase Physical Activity Yes Yes Yes     Intervention Provide advice, education, support and counseling about physical activity/exercise needs.;Develop an individualized exercise prescription for aerobic and resistive training based on initial evaluation findings, risk stratification, comorbidities and participant's personal goals. Provide advice, education, support and counseling about physical activity/exercise needs.;Develop an individualized exercise prescription for aerobic and resistive training based on initial evaluation findings, risk stratification, comorbidities and participant's personal goals. Provide advice, education, support and counseling about physical activity/exercise needs.;Develop an individualized exercise prescription for aerobic and resistive training based on initial evaluation findings, risk stratification, comorbidities and participant's personal goals.     Expected Outcomes Short Term: Attend rehab on a regular basis to increase amount of physical activity.;Long Term: Add in home exercise to make exercise part of routine and to increase amount of physical activity.;Long Term: Exercising regularly at least 3-5 days a week. Short Term: Attend rehab on a regular basis to increase amount of physical activity.;Long Term: Add in home exercise to make exercise part of routine and to increase amount of physical activity.;Long Term: Exercising regularly at least 3-5 days a week.  Short Term: Attend rehab on a regular basis to increase amount of physical activity.;Long Term: Add in home exercise to make exercise part of routine and to increase amount of physical activity.;Long Term: Exercising regularly at least 3-5 days a week.     Increase Strength and Stamina Yes Yes Yes     Intervention Provide advice, education, support and counseling about physical activity/exercise needs.;Develop an individualized exercise prescription for aerobic and resistive training based on initial evaluation findings, risk stratification, comorbidities and participant's personal goals. Provide advice, education, support and counseling about physical activity/exercise needs.;Develop an individualized exercise prescription for aerobic and resistive training based on initial evaluation findings, risk stratification, comorbidities and participant's personal goals. Provide advice, education, support and counseling about physical activity/exercise needs.;Develop an individualized exercise prescription for aerobic and resistive training based on initial evaluation findings, risk stratification, comorbidities and participant's personal goals.     Expected Outcomes Short Term: Increase workloads from initial exercise prescription for resistance, speed, and METs.;Short Term: Perform resistance training exercises routinely during rehab and add in resistance training at home;Long Term: Improve cardiorespiratory fitness, muscular endurance and strength as measured by increased METs and functional capacity (6MWT) Short Term: Increase workloads from initial exercise prescription for resistance, speed, and METs.;Short Term: Perform resistance training exercises routinely during  rehab and add in resistance training at home;Long Term: Improve cardiorespiratory fitness, muscular endurance and strength as measured by increased METs and functional capacity (6MWT) Short Term: Increase workloads from initial exercise prescription  for resistance, speed, and METs.;Short Term: Perform resistance training exercises routinely during rehab and add in resistance training at home;Long Term: Improve cardiorespiratory fitness, muscular endurance and strength as measured by increased METs and functional capacity (6MWT)     Able to understand and use rate of perceived exertion (RPE) scale Yes Yes Yes     Intervention Provide education and explanation on how to use RPE scale Provide education and explanation on how to use RPE scale Provide education and explanation on how to use RPE scale     Expected Outcomes Short Term: Able to use RPE daily in rehab to express subjective intensity level;Long Term:  Able to use RPE to guide intensity level when exercising independently Short Term: Able to use RPE daily in rehab to express subjective intensity level;Long Term:  Able to use RPE to guide intensity level when exercising independently Short Term: Able to use RPE daily in rehab to express subjective intensity level;Long Term:  Able to use RPE to guide intensity level when exercising independently     Able to understand and use Dyspnea scale Yes Yes Yes     Intervention Provide education and explanation on how to use Dyspnea scale Provide education and explanation on how to use Dyspnea scale Provide education and explanation on how to use Dyspnea scale     Expected Outcomes Short Term: Able to use Dyspnea scale daily in rehab to express subjective sense of shortness of breath during exertion;Long Term: Able to use Dyspnea scale to guide intensity level when exercising independently Short Term: Able to use Dyspnea scale daily in rehab to express subjective sense of shortness of breath during exertion;Long Term: Able to use Dyspnea scale to guide intensity level when exercising independently Short Term: Able to use Dyspnea scale daily in rehab to express subjective sense of shortness of breath during exertion;Long Term: Able to use Dyspnea scale to guide  intensity level when exercising independently     Knowledge and understanding of Target Heart Rate Range (THRR) Yes Yes Yes     Intervention Provide education and explanation of THRR including how the numbers were predicted and where they are located for reference Provide education and explanation of THRR including how the numbers were predicted and where they are located for reference Provide education and explanation of THRR including how the numbers were predicted and where they are located for reference     Expected Outcomes Short Term: Able to state/look up THRR;Long Term: Able to use THRR to govern intensity when exercising independently;Short Term: Able to use daily as guideline for intensity in rehab Short Term: Able to state/look up THRR;Long Term: Able to use THRR to govern intensity when exercising independently;Short Term: Able to use daily as guideline for intensity in rehab Short Term: Able to state/look up THRR;Long Term: Able to use THRR to govern intensity when exercising independently;Short Term: Able to use daily as guideline for intensity in rehab     Understanding of Exercise Prescription Yes Yes Yes     Intervention Provide education, explanation, and written materials on patient's individual exercise prescription Provide education, explanation, and written materials on patient's individual exercise prescription Provide education, explanation, and written materials on patient's individual exercise prescription     Expected Outcomes Short Term: Able to explain program exercise prescription;Long  Term: Able to explain home exercise prescription to exercise independently Short Term: Able to explain program exercise prescription;Long Term: Able to explain home exercise prescription to exercise independently Short Term: Able to explain program exercise prescription;Long Term: Able to explain home exercise prescription to exercise independently            Exercise Goals Re-Evaluation :   Exercise Goals Re-Evaluation    Row Name 08/19/20 1616 09/16/20 1419           Exercise Goal Re-Evaluation   Exercise Goals Review Increase Physical Activity;Increase Strength and Stamina;Able to understand and use rate of perceived exertion (RPE) scale;Able to understand and use Dyspnea scale;Knowledge and understanding of Target Heart Rate Range (THRR);Understanding of Exercise Prescription Increase Physical Activity;Increase Strength and Stamina;Able to understand and use rate of perceived exertion (RPE) scale;Able to understand and use Dyspnea scale;Knowledge and understanding of Target Heart Rate Range (THRR);Understanding of Exercise Prescription      Comments Pt has attended 3 rehab sessions. He has been able to tolerate exercise well and should progress well through the program. He currently exercises at 2.0 METs on the stepper. Will continue to monitor and progress as able. Patient has completed 11 exercise sessions. He has tolerated exercise well with no complaints. He is making progress in the program and is enjoying the program. He is very interested in tracking his progress and learning how we increase his intensities. He is very positive to have in class and is very interactive with the staff and others in his class. He is currently exercising at 2.0 METs on the NuStep. Will continue to monitor and progress as able.      Expected Outcomes Through exercise at rehab and through engaging in a home exercise program, the patient will meet their stated goals. Through exercise at rehab and through engaging in a home exercise program, the patient will meet their stated goals.             Discharge Exercise Prescription (Final Exercise Prescription Changes):  Exercise Prescription Changes - 09/16/20 1400      Response to Exercise   Blood Pressure (Admit) 112/68    Blood Pressure (Exercise) 118/70    Blood Pressure (Exit) 108/70    Heart Rate (Admit) 76 bpm    Heart Rate (Exercise) 87 bpm     Heart Rate (Exit) 78 bpm    Oxygen Saturation (Admit) 95 %    Oxygen Saturation (Exercise) 94 %    Oxygen Saturation (Exit) 97 %    Rating of Perceived Exertion (Exercise) 11    Perceived Dyspnea (Exercise) 11    Duration Continue with 30 min of aerobic exercise without signs/symptoms of physical distress.    Intensity THRR unchanged      Progression   Progression Continue to progress workloads to maintain intensity without signs/symptoms of physical distress.      Resistance Training   Training Prescription Yes    Weight 4 lbs    Reps 10-15    Time 10 Minutes      NuStep   Level 2    SPM 84    Minutes 22    METs 2      Arm Ergometer   Level 1.5    RPM 57    Minutes 17    METs 2.1           Nutrition:  Target Goals: Understanding of nutrition guidelines, daily intake of sodium 1500mg , cholesterol 200mg , calories 30% from fat and 7%  or less from saturated fats, daily to have 5 or more servings of fruits and vegetables.  Biometrics:  Pre Biometrics - 09/16/20 1441      Pre Biometrics   Weight 90.3 kg            Nutrition Therapy Plan and Nutrition Goals:  Nutrition Therapy & Goals - 09/08/20 1413      Personal Nutrition Goals   Comments We will continue to provide nutritional education and monitor.      Intervention Plan   Intervention Nutrition handout(s) given to patient.           Nutrition Assessments:  Nutrition Assessments - 08/08/20 1256      MEDFICTS Scores   Post Score 84          MEDIFICTS Score Key:  ?70 Need to make dietary changes   40-70 Heart Healthy Diet  ? 40 Therapeutic Level Cholesterol Diet   Picture Your Plate Scores:  <54 Unhealthy dietary pattern with much room for improvement.  41-50 Dietary pattern unlikely to meet recommendations for good health and room for improvement.  51-60 More healthful dietary pattern, with some room for improvement.   >60 Healthy dietary pattern, although there may be some  specific behaviors that could be improved.    Nutrition Goals Re-Evaluation:   Nutrition Goals Discharge (Final Nutrition Goals Re-Evaluation):   Psychosocial: Target Goals: Acknowledge presence or absence of significant depression and/or stress, maximize coping skills, provide positive support system. Participant is able to verbalize types and ability to use techniques and skills needed for reducing stress and depression.  Initial Review & Psychosocial Screening:  Initial Psych Review & Screening - 08/08/20 1255      Initial Review   Current issues with Current Stress Concerns    Source of Stress Concerns Family    Comments Grieving the death of his wife.      Family Dynamics   Good Support System? Yes    Comments His two children and his grandchildren are his support system.      Barriers   Psychosocial barriers to participate in program The patient should benefit from training in stress management and relaxation.      Screening Interventions   Interventions Encouraged to exercise;Provide feedback about the scores to participant;To provide support and resources with identified psychosocial needs    Expected Outcomes Long Term Goal: Stressors or current issues are controlled or eliminated.;Short Term goal: Identification and review with participant of any Quality of Life or Depression concerns found by scoring the questionnaire.;Long Term goal: The participant improves quality of Life and PHQ9 Scores as seen by post scores and/or verbalization of changes           Quality of Life Scores:  Quality of Life - 08/08/20 1435      Quality of Life   Select Quality of Life      Quality of Life Scores   Health/Function Pre 9.34 %    Socioeconomic Pre 16.4 %    Psych/Spiritual Pre 15.08 %    Family Pre 23.63 %    GLOBAL Pre 13.44 %          Scores of 19 and below usually indicate a poorer quality of life in these areas.  A difference of  2-3 points is a clinically meaningful  difference.  A difference of 2-3 points in the total score of the Quality of Life Index has been associated with significant improvement in overall quality of life, self-image, physical  symptoms, and general health in studies assessing change in quality of life.   PHQ-9: Recent Review Flowsheet Data    Depression screen [facility] 2/9 08/08/2020 06/25/2020 02/13/2019   Decreased Interest 3  0 0   Down, Depressed, Hopeless 0 0 0   PHQ - 2 Score 3 0 0   Altered sleeping 0 - -   Tired, decreased energy 3 - -   Change in appetite 0 - -   Feeling bad or failure about yourself  0 - -   Trouble concentrating 0 - -   Moving slowly or fidgety/restless 0 - -   Suicidal thoughts 0 - -   PHQ-9 Score 6 - -   Difficult doing work/chores Somewhat difficult - -     Interpretation of Total Score  Total Score Depression Severity:  1-4 = Minimal depression, 5-9 = Mild depression, 10-14 = Moderate depression, 15-19 = Moderately severe depression, 20-27 = Severe depression   Psychosocial Evaluation and Intervention:  Psychosocial Evaluation - 08/08/20 1438      Psychosocial Evaluation & Interventions   Interventions Stress management education;Relaxation education;Encouraged to exercise with the program and follow exercise prescription    Comments Pt has no barriers to participating in pulmonary rehab. He is still grieving the death of his wife who died 3 years ago. They were married for 58 years. He reports that he no longer enjoys doing a lot of the things that he used to due to his sadness. He stated that he tried to attend some grief support groups at first, but he states he thought that they were not beneficial at all. He was defensive when talking about the subject of his wife's death and when I asked if he needed to talk to a councelor. He states that he will cope on his own, but I do think he would benefit from professional support. He reports that he has a good support system now with his 2 children and his  grandchildren. A lot of his other problems stem from his wife's death. He reports that she did all of the cooking and he had never learned to cook, so now his diet has gotten unhealthy. Additionally, he states that he let himself go physically after her death. This has made the SOB from his COPD even worse. He scored a 6 on his PHQ-9 and a 13.44 on his QOL. His decreased QOL is associated with his grieving and his health. He does have optimism that the pulmonary rehab program will better his health and help him breath better. He will benefit from stress management education, relaxation education, and overall social support from staff and other patients.    Expected Outcomes Through education and exercise, the patient's stressors will be reduced.    Continue Psychosocial Services  Follow up required by staff           Psychosocial Re-Evaluation:  Psychosocial Re-Evaluation    [place] Name 08/13/20 1343 09/08/20 1408           Psychosocial Re-Evaluation   Current issues with None Identified None Identified      Comments Patient is new in program . His initial QOL score is 13.44 and his PHQ-9 was 6. He seems to enjoy coming and interacting with other group members and staff. He was referred to pulmonary rehav by Dr. Ander Slade with a diagnosis of COPDwith acute exacerbation.  His personal goals for the program are to improve SOB, lose weight and get stronger.  We will continue  to monitor as he works toward meeting these goals. Patient has completed 8 sessions.   He seems to enjoy coming to class and interacting with other group members and staff.  His personal goals for the program are to improve SOB, lose weight and get stronger.  We will continue to monitor as he works toward meeting these goals.  He is doing well in the program with progression and consistent attendance.      Expected Outcomes Patient will have no psychosocial barriers identified at discharge. Patient will have no psychosocial barriers  identified at discharge.      Interventions Stress management education;Relaxation education;Encouraged to attend Pulmonary Rehabilitation for the exercise Stress management education;Relaxation education;Encouraged to attend Pulmonary Rehabilitation for the exercise      Continue Psychosocial Services  No Follow up required No Follow up required             Psychosocial Discharge (Final Psychosocial Re-Evaluation):  Psychosocial Re-Evaluation - 09/08/20 1408      Psychosocial Re-Evaluation   Current issues with None Identified    Comments Patient has completed 8 sessions.   He seems to enjoy coming to class and interacting with other group members and staff.  His personal goals for the program are to improve SOB, lose weight and get stronger.  We will continue to monitor as he works toward meeting these goals.  He is doing well in the program with progression and consistent attendance.    Expected Outcomes Patient will have no psychosocial barriers identified at discharge.    Interventions Stress management education;Relaxation education;Encouraged to attend Pulmonary Rehabilitation for the exercise    Continue Psychosocial Services  No Follow up required            Education: Education Goals: Education classes will be provided on a weekly basis, covering required topics. Participant will state understanding/return demonstration of topics presented.  Learning Barriers/Preferences:  Learning Barriers/Preferences - 08/08/20 1304      Learning Barriers/Preferences   Learning Barriers Hearing    Learning Preferences Written Material;Audio;Skilled Demonstration           Education Topics: How Lungs Work and Diseases: - Discuss the anatomy of the lungs and diseases that can affect the lungs, such as COPD. Flowsheet Row PULMONARY REHAB CHRONIC OBSTRUCTIVE PULMONARY DISEASE from 09/11/2020 in [place]  Date 08/21/20  Educator DJ  Instruction Review Code 1-  Verbalizes Understanding      Exercise: -Discuss the importance of exercise, FITT principles of exercise, normal and abnormal responses to exercise, and how to exercise safely.   Environmental Irritants: -Discuss types of environmental irritants and how to limit exposure to environmental irritants. Flowsheet Row PULMONARY REHAB CHRONIC OBSTRUCTIVE PULMONARY DISEASE from 09/11/2020 in [place]  Date 08/28/20  Educator [facility]  Instruction Review Code 1- Verbalizes Understanding      Meds/Inhalers and oxygen: - Discuss respiratory medications, definition of an inhaler and oxygen, and the proper way to use an inhaler and oxygen. Flowsheet Row PULMONARY REHAB CHRONIC OBSTRUCTIVE PULMONARY DISEASE from 09/11/2020 in [place]  Date 09/04/20  Educator [facility]      Energy Saving Techniques: - Discuss methods to conserve energy and decrease shortness of breath when performing activities of daily living.  Flowsheet Row PULMONARY REHAB CHRONIC OBSTRUCTIVE PULMONARY DISEASE from 09/11/2020 in [place]  Date 09/11/20  Educator mk  Instruction Review Code 2- Demonstrated Understanding      Bronchial Hygiene / Breathing Techniques: -  Discuss breathing mechanics, pursed-lip breathing technique,  proper posture, effective ways to clear airways, and other functional breathing techniques   Cleaning Equipment: - Provides group verbal and written instruction about the health risks of elevated stress, cause of high stress, and healthy ways to reduce stress.   Nutrition I: Fats: - Discuss the types of cholesterol, what cholesterol does to the body, and how cholesterol levels can be controlled.   Nutrition II: Labels: -Discuss the different components of food labels and how to read food labels.   Respiratory Infections: - Discuss the signs and symptoms of respiratory infections, ways to prevent respiratory infections, and the  importance of seeking medical treatment when having a respiratory infection.   Stress I: Signs and Symptoms: - Discuss the causes of stress, how stress may lead to anxiety and depression, and ways to limit stress.   Stress II: Relaxation: -Discuss relaxation techniques to limit stress.   Oxygen for Home/Travel: - Discuss how to prepare for travel when on oxygen and proper ways to transport and store oxygen to ensure safety.   Knowledge Questionnaire Score:  Knowledge Questionnaire Score - 08/08/20 1304      Knowledge Questionnaire Score   Pre Score 14/18           Core Components/Risk Factors/Patient Goals at Admission:  Personal Goals and Risk Factors at Admission - 08/08/20 1304      Core Components/Risk Factors/Patient Goals on Admission    Weight Management Yes;Weight Loss    Intervention Weight Management: Develop a combined nutrition and exercise program designed to reach desired caloric intake, while maintaining appropriate intake of nutrient and fiber, sodium and fats, and appropriate energy expenditure required for the weight goal.;Weight Management: Provide education and appropriate resources to help participant work on and attain dietary goals.;Weight Management/Obesity: Establish reasonable short term and long term weight goals.;Obesity: Provide education and appropriate resources to help participant work on and attain dietary goals.    Expected Outcomes Short Term: Continue to assess and modify interventions until short term weight is achieved;Long Term: Adherence to nutrition and physical activity/exercise program aimed toward attainment of established weight goal;Weight Loss: Understanding of general recommendations for a balanced deficit meal plan, which promotes 1-2 lb weight loss per week and includes a negative energy balance of [id number] kcal/d;Understanding recommendations for meals to include 15-35% energy as protein, 25-35% energy from fat, 35-60% energy from  carbohydrates, less than 200mg  of dietary cholesterol, 20-35 gm of total fiber daily;Understanding of distribution of calorie intake throughout the day with the consumption of 4-5 meals/snacks    Improve shortness of breath with ADL's Yes    Intervention Provide education, individualized exercise plan and daily activity instruction to help decrease symptoms of SOB with activities of daily living.    Expected Outcomes Short Term: Improve cardiorespiratory fitness to achieve a reduction of symptoms when performing ADLs;Long Term: Be able to perform more ADLs without symptoms or delay the onset of symptoms    Stress Yes    Intervention Offer individual and/or small group education and counseling on adjustment to heart disease, stress management and health-related lifestyle change. Teach and support self-help strategies.;Refer participants experiencing significant psychosocial distress to appropriate mental health specialists for further evaluation and treatment. When possible, include family members and significant others in education/counseling sessions.    Expected Outcomes Long Term: Emotional wellbeing is indicated by absence of clinically significant psychosocial distress or social isolation.;Short Term: Participant demonstrates changes in health-related behavior, relaxation and other stress management skills, ability to  obtain effective social support, and compliance with psychotropic medications if prescribed.           Core Components/Risk Factors/Patient Goals Review:   Goals and Risk Factor Review    Row Name 08/13/20 1357 09/08/20 1415           Core Components/Risk Factors/Patient Goals Review   Personal Goals Review Improve shortness of breath with ADL's Improve shortness of breath with ADL's      Review Patient is new in the program.  His O2 sats was good on room air at 95-96% while exercising.  He seems to have a positive outlook and very interative with others in class.  His  personal goals are to get stronger and have more energy and increase ADL's.  We will continue to monitor as he works toward meeting these goals. Patient has completed 8 sessions.  His O2 sats was good on room air at 93-96% while exercising. Had appointment with Dr. Luna Glasgow on 08-21-20. He continues to have pain in left shoulder. no changes made in medication.  Continue on current medication regimen. He seems to have a positive outlook and very interative with others in class.  His personal goals are to get stronger and have more energy and increase ADL's.  We will continue to monitor as he works toward meeting these goals.      Expected Outcomes Patient will complete the program meeting both program and personal goals. Patient will complete the program meeting both program and personal goals.             Core Components/Risk Factors/Patient Goals at Discharge (Final Review):   Goals and Risk Factor Review - 09/08/20 1415      Core Components/Risk Factors/Patient Goals Review   Personal Goals Review Improve shortness of breath with ADL's    Review Patient has completed 8 sessions.  His O2 sats was good on room air at 93-96% while exercising. Had appointment with Dr. Luna Glasgow on 08-21-20. He continues to have pain in left shoulder. no changes made in medication.  Continue on current medication regimen. He seems to have a positive outlook and very interative with others in class.  His personal goals are to get stronger and have more energy and increase ADL's.  We will continue to monitor as he works toward meeting these goals.    Expected Outcomes Patient will complete the program meeting both program and personal goals.           ITP Comments:   Comments: ITP REVIEW Pt is making expected progress toward pulmonary rehab goals after completing 12 sessions. Recommend continued exercise, life style modification, education, and utilization of breathing techniques to increase stamina and strength and  decrease shortness of breath with exertion.

## 2020-09-23 ENCOUNTER — Encounter (HOSPITAL_COMMUNITY)
Admission: RE | Admit: 2020-09-23 | Discharge: 2020-09-23 | Disposition: A | Discharge status: Home / Self Care | Payer: Medicare Other | Source: Ambulatory Visit | Attending: Pulmonary Disease | Admitting: Pulmonary Disease

## 2020-09-23 ENCOUNTER — Other Ambulatory Visit: Payer: Self-pay

## 2020-09-23 DIAGNOSIS — J441 Chronic obstructive pulmonary disease with (acute) exacerbation: Secondary | ICD-10-CM | POA: Diagnosis not present

## 2020-09-23 NOTE — Progress Notes (Signed)
Daily Session Note  Patient Details  Name: [name] [name] MRN: [id number] Date of Birth: 1940-06-25 Referring Provider:   [place] from 08/08/2020 in [place]  Referring Provider Dr. Ander Slade      Encounter Date: 09/23/2020  Check In:  Session Check In - 09/23/20 1330      Check-In   Supervising physician immediately available to respond to emergencies [facility] MD immediately available    Physician(s) Dr. Domenic Polite    Location [facility]    Staff Present Geanie Cooley, RN;Madison Audria Nine, MS, Exercise Physiologist;Oveda Dadamo Kris Mouton, MS, ACSM-CEP, Exercise Physiologist    Virtual Visit No    Medication changes reported     No    Fall or balance concerns reported    No    Tobacco Cessation No Change    Warm-up and Cool-down Performed as group-led instruction    Resistance Training Performed Yes    VAD Patient? No    PAD/SET Patient? No      Pain Assessment   Currently in Pain? No/denies    Pain Score 0-No pain    Multiple Pain Sites No           Capillary Blood Glucose: No results found for this or any previous visit (from the past 24 hour(s)).    Social History   Tobacco Use  Smoking Status Former Smoker  . Packs/day: 1.00  . Years: 35.00  . Pack years: 35.00  . Types: Cigarettes  . Quit date: 03/28/2003  . Years since quitting: 17.5  Smokeless Tobacco Never Used    Goals Met:  Independence with exercise equipment Exercise tolerated well No report of cardiac concerns or symptoms Strength training completed today  Goals Unmet:  Not Applicable  Comments: checkout time is 1430   Dr. Kathie Dike is Medical Director for [facility] Pulmonary Rehab.

## 2020-09-25 ENCOUNTER — Other Ambulatory Visit: Payer: Self-pay

## 2020-09-25 ENCOUNTER — Encounter (HOSPITAL_COMMUNITY)
Admission: RE | Admit: 2020-09-25 | Discharge: 2020-09-25 | Disposition: A | Discharge status: Home / Self Care | Payer: Medicare Other | Source: Ambulatory Visit | Attending: Pulmonary Disease | Admitting: Pulmonary Disease

## 2020-09-25 DIAGNOSIS — J441 Chronic obstructive pulmonary disease with (acute) exacerbation: Secondary | ICD-10-CM | POA: Diagnosis not present

## 2020-09-25 NOTE — Progress Notes (Signed)
Daily Session Note  Patient Details  Name: [name] [name] MRN: [id number] Date of Birth: 1940/06/24 Referring Provider:   Flowsheet Row PULMONARY REHAB OTHER RESP ORIENTATION from 08/08/2020 in [place]  Referring Provider Dr. Ander Slade      Encounter Date: 09/25/2020  Check In:  Session Check In - 09/25/20 1330      Check-In   Supervising physician immediately available to respond to emergencies [facility] MD immediately available    Physician(s) Dr. Domenic Polite    Location [facility]    Staff Present Geanie Cooley, RN;Debra Wynetta Emery, RN, BSN    Virtual Visit No    Medication changes reported     No    Fall or balance concerns reported    No    Tobacco Cessation No Change    Warm-up and Cool-down Performed as group-led instruction    Resistance Training Performed Yes    VAD Patient? No    PAD/SET Patient? No      Pain Assessment   Currently in Pain? No/denies    Pain Score 0-No pain    Multiple Pain Sites No           Capillary Blood Glucose: No results found for this or any previous visit (from the past 24 hour(s)).    Social History   Tobacco Use  Smoking Status Former Smoker  . Packs/day: 1.00  . Years: 35.00  . Pack years: 35.00  . Types: Cigarettes  . Quit date: 03/28/2003  . Years since quitting: 17.5  Smokeless Tobacco Never Used    Goals Met:  Proper associated with RPD/PD & O2 Sat Independence with exercise equipment Exercise tolerated well No report of cardiac concerns or symptoms Strength training completed today  Goals Unmet:  Not Applicable  Comments: check out at 2:30pm   Dr. Kathie Dike is Medical Director for [facility] Pulmonary Rehab.

## 2020-09-30 ENCOUNTER — Encounter (HOSPITAL_COMMUNITY): Payer: Medicare Other

## 2020-09-30 ENCOUNTER — Ambulatory Visit: Payer: Medicare Other | Admitting: Orthopaedic Surgery

## 2020-10-01 DIAGNOSIS — H04123 Dry eye syndrome of bilateral lacrimal glands: Secondary | ICD-10-CM | POA: Diagnosis not present

## 2020-10-01 DIAGNOSIS — H0102A Squamous blepharitis right eye, upper and lower eyelids: Secondary | ICD-10-CM | POA: Diagnosis not present

## 2020-10-01 DIAGNOSIS — H0102B Squamous blepharitis left eye, upper and lower eyelids: Secondary | ICD-10-CM | POA: Diagnosis not present

## 2020-10-01 DIAGNOSIS — H401122 Primary open-angle glaucoma, left eye, moderate stage: Secondary | ICD-10-CM | POA: Diagnosis not present

## 2020-10-02 ENCOUNTER — Encounter (HOSPITAL_COMMUNITY): Payer: Medicare Other

## 2020-10-07 ENCOUNTER — Other Ambulatory Visit: Payer: Self-pay

## 2020-10-07 ENCOUNTER — Encounter (HOSPITAL_COMMUNITY)
Admission: RE | Admit: 2020-10-07 | Discharge: 2020-10-07 | Disposition: A | Discharge status: Home / Self Care | Payer: Medicare Other | Source: Ambulatory Visit | Attending: Pulmonary Disease | Admitting: Pulmonary Disease

## 2020-10-07 VITALS — Wt 199.7 lb

## 2020-10-07 DIAGNOSIS — J441 Chronic obstructive pulmonary disease with (acute) exacerbation: Secondary | ICD-10-CM

## 2020-10-07 NOTE — Progress Notes (Signed)
Daily Session Note  Patient Details  Name: [name] [name] MRN: [id number] Date of Birth: [birth year] Referring Provider:   Flowsheet Row PULMONARY REHAB OTHER RESP ORIENTATION from 08/08/2020 in [place]  Referring Provider Dr. Ander Slade      Encounter Date: 10/07/2020  Check In:  Session Check In - 10/07/20 1330      Check-In   Supervising physician immediately available to respond to emergencies [facility] MD immediately available    Physician(s) Dr. Domenic Polite    Location [facility]    Staff Present Geanie Cooley, RN;Analiza Cowger Audria Nine, MS, Exercise Physiologist    Virtual Visit No    Medication changes reported     No    Fall or balance concerns reported    No    Tobacco Cessation No Change    Warm-up and Cool-down Performed as group-led instruction    Resistance Training Performed Yes    VAD Patient? No    PAD/SET Patient? No      Pain Assessment   Currently in Pain? No/denies    Pain Score 0-No pain    Multiple Pain Sites No           Capillary Blood Glucose: No results found for this or any previous visit (from the past 24 hour(s)).    Social History   Tobacco Use  Smoking Status Former Smoker  . Packs/day: 1.00  . Years: 35.00  . Pack years: 35.00  . Types: Cigarettes  . Quit date: 03/28/2003  . Years since quitting: 17.5  Smokeless Tobacco Never Used    Goals Met:  Proper associated with RPD/PD & O2 Sat Independence with exercise equipment Improved SOB with ADL's Using PLB without cueing & demonstrates good technique Exercise tolerated well No report of cardiac concerns or symptoms Strength training completed today  Goals Unmet:  Not Applicable  Comments: check out 1430   Dr. Kathie Dike is Medical Director for [facility] Pulmonary Rehab.

## 2020-10-09 ENCOUNTER — Encounter (HOSPITAL_COMMUNITY): Payer: Medicare Other

## 2020-10-14 ENCOUNTER — Other Ambulatory Visit: Payer: Self-pay

## 2020-10-14 ENCOUNTER — Encounter: Payer: Self-pay | Admitting: Orthopaedic Surgery

## 2020-10-14 ENCOUNTER — Ambulatory Visit (INDEPENDENT_AMBULATORY_CARE_PROVIDER_SITE_OTHER): Payer: Medicare Other | Admitting: Orthopaedic Surgery

## 2020-10-14 ENCOUNTER — Encounter (HOSPITAL_COMMUNITY)
Admission: RE | Admit: 2020-10-14 | Discharge: 2020-10-14 | Disposition: A | Discharge status: Home / Self Care | Payer: Medicare Other | Source: Ambulatory Visit | Attending: Pulmonary Disease | Admitting: Pulmonary Disease

## 2020-10-14 VITALS — BP 118/79 | HR 75 | Ht 73.0 in | Wt 200.4 lb

## 2020-10-14 DIAGNOSIS — J441 Chronic obstructive pulmonary disease with (acute) exacerbation: Secondary | ICD-10-CM | POA: Diagnosis not present

## 2020-10-14 DIAGNOSIS — M25512 Pain in left shoulder: Secondary | ICD-10-CM

## 2020-10-14 DIAGNOSIS — I2510 Atherosclerotic heart disease of native coronary artery without angina pectoris: Secondary | ICD-10-CM | POA: Diagnosis not present

## 2020-10-14 NOTE — Progress Notes (Signed)
Daily Session Note  Patient Details  Name: [name] [name] MRN: [id number] Date of Birth: 1941/04/24 Referring Provider:   Flowsheet Row PULMONARY REHAB OTHER RESP ORIENTATION from 08/08/2020 in [place]  Referring Provider Dr. Ander [name]      Encounter Date: 10/14/2020  Check In:  Session Check In - 10/14/20 1330      Check-In   Supervising physician immediately available to respond to emergencies [facility] MD immediately available    Physician(s) Dr. Harl Bowie    Location [facility]    Staff Present Cathren Harsh, MS, Exercise Physiologist;Desirae Mancusi Kris Mouton, MS, ACSM-CEP, Exercise Physiologist    Virtual Visit No    Medication changes reported     No    Fall or balance concerns reported    No    Tobacco Cessation No Change    Warm-up and Cool-down Performed as group-led instruction    Resistance Training Performed Yes    VAD Patient? No    PAD/SET Patient? No      Pain Assessment   Currently in Pain? No/denies    Pain Score 0-No pain    Multiple Pain Sites No           Capillary Blood Glucose: No results found for this or any previous visit (from the past 24 hour(s)).    Social History   Tobacco Use  Smoking Status Former Smoker  . Packs/day: 1.00  . Years: 35.00  . Pack years: 35.00  . Types: Cigarettes  . Quit date: 03/28/2003  . Years since quitting: 17.5  Smokeless Tobacco Never Used    Goals Met:  Independence with exercise equipment Exercise tolerated well No report of cardiac concerns or symptoms Strength training completed today  Goals Unmet:  Not Applicable  Comments: checkout time is 1430   Dr. Kathie Dike is Medical Director for [facility].

## 2020-10-14 NOTE — Progress Notes (Signed)
I am better  His left shoulder is improved. He has full ROM. He is going to pulmonary rehab and that has helped his shoulder.  NV intact.  Encounter Diagnosis  Name Primary?  . Pain in joint of left shoulder Yes   I will see as needed. Call if any problem.  Precautions discussed.   Electronically Signed Sanjuana Kava, MD 5/31/202211:14 AM

## 2020-10-16 ENCOUNTER — Other Ambulatory Visit: Payer: Self-pay

## 2020-10-16 ENCOUNTER — Encounter (HOSPITAL_COMMUNITY)
Admission: RE | Admit: 2020-10-16 | Discharge: 2020-10-16 | Disposition: A | Discharge status: Home / Self Care | Payer: Medicare Other | Source: Ambulatory Visit | Attending: Pulmonary Disease | Admitting: Pulmonary Disease

## 2020-10-16 DIAGNOSIS — J441 Chronic obstructive pulmonary disease with (acute) exacerbation: Secondary | ICD-10-CM | POA: Diagnosis not present

## 2020-10-16 NOTE — Progress Notes (Signed)
Pulmonary Individual Treatment Plan  Patient Details  Name: [name] [name] MRN: [id number] Date of Birth: 03/07/1941 Referring Provider:   Flowsheet Row PULMONARY REHAB OTHER RESP ORIENTATION from 08/08/2020 in [place]  Referring Provider Dr. Ander Slade      Initial Encounter Date:  Flowsheet Row PULMONARY REHAB OTHER RESP ORIENTATION from 08/08/2020 in [place]  Date 08/08/20      Visit Diagnosis: COPD with acute exacerbation ([place])  Patient's Home Medications on Admission:   Current Outpatient Medications:  .  albuterol (PROVENTIL) (2.5 MG/3ML) 0.083% nebulizer solution, Take 3 mLs (2.5 mg total) by nebulization every 6 (six) hours as needed for wheezing or shortness of breath., Disp: 100 mL, Rfl: 2 .  albuterol (VENTOLIN HFA) 108 (90 Base) MCG/ACT inhaler, 2 puffs as needed, Disp: , Rfl:  .  alfuzosin (UROXATRAL) 10 MG 24 hr tablet, Take 1 tablet (10 mg total) by mouth daily with breakfast., Disp: 90 tablet, Rfl: 3 .  cetirizine (ZYRTEC) 10 MG tablet, Take 10 mg by mouth daily., Disp: , Rfl:  .  dorzolamide-timolol (COSOPT) 22.3-6.8 MG/ML ophthalmic solution, Place 1 drop into both eyes 2 (two) times daily. , Disp: , Rfl:  .  esomeprazole (NEXIUM) 20 MG capsule, Take 20 mg by mouth daily at 12 noon., Disp: , Rfl:  .  esomeprazole (NEXIUM) 40 MG capsule, Take 40 mg by mouth daily., Disp: , Rfl:  .  fluticasone (FLOVENT HFA) 220 MCG/ACT inhaler, Inhale 2 puffs into the lungs daily. (Patient taking differently: Inhale 2 puffs into the lungs in the morning and at bedtime.), Disp: 12 g, Rfl: 5 .  hydroxypropyl methylcellulose / hypromellose (ISOPTO TEARS / GONIOVISC) 2.5 % ophthalmic solution, Place 1 drop into both eyes 3 (three) times daily as needed for dry eyes., Disp: , Rfl:  .  LORazepam (ATIVAN) 0.5 MG tablet, Take 1 tablet (0.5 mg total) by mouth at bedtime., Disp: 30 tablet, Rfl: 3 .  losartan (COZAAR) 25 MG tablet, Take 1 tablet (25  mg total) by mouth daily., Disp: 90 tablet, Rfl: 1 .  lovastatin (MEVACOR) 40 MG tablet, Take 1 tablet (40 mg total) by mouth daily., Disp: 90 tablet, Rfl: 1 .  nitroGLYCERIN (NITROSTAT) 0.4 MG SL tablet, Place 1 tablet (0.4 mg total) under the tongue every 5 (five) minutes as needed for chest pain., Disp: 25 tablet, Rfl: 5 .  pseudoephedrine-guaifenesin (MUCINEX D) 60-600 MG 12 hr tablet, Take 1 tablet by mouth 2 (two) times daily as needed for congestion., Disp: , Rfl:  .  sodium chloride (OCEAN) 0.65 % SOLN nasal spray, Place 1 spray into both nostrils as needed for congestion., Disp: , Rfl:   Past Medical History: Past Medical History:  Diagnosis Date  . AAA (abdominal aortic aneurysm) ([place])    needs yearly ultrasound  . Allergy   . Anemia   . Arthritis   . Asthma   . BCC (basal cell carcinoma) 08/18/1989   left shoulder blad, upper right arm, left arm beyond elbow, c&d  . BCC (basal cell carcinoma) 01/31/1992   Posterior neck, curetx3, 75fu  . BCC (basal cell carcinoma) 11/22/2001   mid forehead, cx3, excision, right forearm, cx3, 70fu  . BCC (basal cell carcinoma) 10/09/2003   mid forehead, MOHs  . BCC (basal cell carcinoma) 08/15/2008   upper left back, biopsy  . BPH (benign prostatic hyperplasia)   . CAD (coronary artery disease)   . Cancer ([place])    skin cancer  . COPD (  chronic obstructive pulmonary disease) ([place])   . Dysrhythmia    pt. states it can be fast at times  . GERD (gastroesophageal reflux disease)   . Glaucoma   . HOH (hard of hearing)   . Hypercholesterolemia   . Hypertension   . Impaired fasting glucose   . Low back pain   . Melanoma in situ ([place]) 10/09/2003   left chin, MOHs  . MI (myocardial infarction) ([place]) 1999  . SCC (squamous cell carcinoma) 07/03/2014   in situ, behind left ear, cx3, cautery, 64fu  . SCC (squamous cell carcinoma) 07/03/2014   well diff, left forearm, biopsy, cx1, cautery  . SCC (squamous cell carcinoma) 07/20/2017   in situ,  left upper arm, cx3, 44fu  . SCC (squamous cell carcinoma) 01/10/2019   in situ, left post shoulder, cx3, 15fu  . SCC (squamous cell carcinoma) 11/22/2001   left forearm distal, left forearm, cx3, 65fu  . SCC (squamous cell carcinoma) 10/09/2003   [place], left ear post, clear per st, right cheek clear  . SCC (squamous cell carcinoma) 03/30/2004   in situ, left upper arm, cx3, 9fu  . SCC (squamous cell carcinoma) 03/08/2005   in situ, right cheek, mid upper forehead, cx3, 22fu  . SCC (squamous cell carcinoma) 06/08/2006   in situ, left shoulder, cx3, 51fu  . SCC (squamous cell carcinoma) 05/05/2010   right inner wrist, biopsy  . SCC (squamous cell carcinoma) 09/13/2013   in situ, right crown scalp, front scalp, biopsy  . Thrush     Tobacco Use: Social History   Tobacco Use  Smoking Status Former Smoker  . Packs/day: 1.00  . Years: 35.00  . Pack years: 35.00  . Types: Cigarettes  . Quit date: 03/28/2003  . Years since quitting: 17.5  Smokeless Tobacco Never Used    Labs: Recent Chemical engineer    Labs for ITP Cardiac and Pulmonary Rehab Latest Ref Rng & Units 06/23/2017 02/14/2019 09/05/2019 11/12/2019 09/05/2020   Cholestrol 100 - 199 mg/dL 122 117 124 107 129   LDLCALC 0 - 99 mg/dL 67 61 63 61 78   HDL >39 mg/dL 43 38(L) 46 35(L) 37(L)   Trlycerides 0 - 149 mg/dL 61 93 71 54 70      Capillary Blood Glucose: Lab Results  Component Value Date   GLUCAP 97 09/17/2016   GLUCAP 145 (H) 09/16/2016     Pulmonary Assessment Scores:  Pulmonary Assessment Scores    Row Name 08/08/20 1254         ADL UCSD   SOB Score total 22           CAT Score   CAT Score 19           mMRC Score   mMRC Score 2           UCSD: Self-administered rating of dyspnea associated with activities of daily living (ADLs) 6-point scale (0 = "not at all" to 5 = "maximal or unable to do because of breathlessness")  Scoring Scores range from 0 to 120.  Minimally important difference is 5  units  CAT: CAT can identify the health impairment of COPD patients and is better correlated with disease progression.  CAT has a scoring range of zero to 40. The CAT score is classified into four groups of low (less than 10), medium (10 - 20), high (21-30) and very high (31-40) based on the impact level of disease on health status. A CAT score over 10 suggests  significant symptoms.  A worsening CAT score could be explained by an exacerbation, poor medication adherence, poor inhaler technique, or progression of COPD or comorbid conditions.  CAT MCID is 2 points  mMRC: mMRC (Modified Medical Research Council) Dyspnea Scale is used to assess the degree of baseline functional disability in patients of respiratory disease due to dyspnea. No minimal important difference is established. A decrease in score of 1 point or greater is considered a positive change.   Pulmonary Function Assessment:   Exercise Target Goals: Exercise Program Goal: Individual exercise prescription set using results from initial 6 min walk test and THRR while considering  patient's activity barriers and safety.   Exercise Prescription Goal: Initial exercise prescription builds to 30-45 minutes a day of aerobic activity, 2-3 days per week.  Home exercise guidelines will be given to patient during program as part of exercise prescription that the participant will acknowledge.  Activity Barriers & Risk Stratification:  Activity Barriers & Cardiac Risk Stratification - 08/08/20 1253      Activity Barriers & Cardiac Risk Stratification   Activity Barriers Arthritis;Back Problems;Neck/Spine Problems;Deconditioning;Muscular Weakness;Shortness of Breath    Cardiac Risk Stratification High           6 Minute Walk:  6 Minute Walk    Row Name 08/08/20 1428         6 Minute Walk   Phase Initial     Distance 1200 feet     Walk Time 6 minutes     # of Rest Breaks 0     MPH 2.27     METS 2.43     RPE 11     Perceived  Dyspnea  13     VO2 Peak 8.51     Resting HR 72 bpm     Resting BP 112/74     Resting Oxygen Saturation  96 %     Exercise Oxygen Saturation  during 6 min walk 91 %     Max Ex. HR 99 bpm     Max Ex. BP 128/64     2 Minute Post BP 106/70           Interval HR   1 Minute HR 96     2 Minute HR 99     3 Minute HR 97     4 Minute HR 93     5 Minute HR 96     6 Minute HR 96     2 Minute Post HR 83     Interval Heart Rate? Yes           Interval Oxygen   Interval Oxygen? Yes     Baseline Oxygen Saturation % 96 %     1 Minute Oxygen Saturation % 93 %     1 Minute Liters of Oxygen 0 L     2 Minute Oxygen Saturation % 91 %     2 Minute Liters of Oxygen 0 L     3 Minute Oxygen Saturation % 92 %     3 Minute Liters of Oxygen 0 L     4 Minute Oxygen Saturation % 91 %     4 Minute Liters of Oxygen 0 L     5 Minute Oxygen Saturation % 92 %     5 Minute Liters of Oxygen 0 L     6 Minute Oxygen Saturation % 92 %     6 Minute Liters of Oxygen 0 L  2 Minute Post Oxygen Saturation % 96 %     2 Minute Post Liters of Oxygen 0 L            Oxygen Initial Assessment:  Oxygen Initial Assessment - 08/08/20 1433      Initial 6 min Walk   Oxygen Used None      Program Oxygen Prescription   Program Oxygen Prescription None      Intervention   Short Term Goals To learn and exhibit compliance with exercise, home and travel O2 prescription;To learn and understand importance of monitoring SPO2 with pulse oximeter and demonstrate accurate use of the pulse oximeter.;To learn and understand importance of maintaining oxygen saturations>88%;To learn and demonstrate proper pursed lip breathing techniques or other breathing techniques.;To learn and demonstrate proper use of respiratory medications    Long  Term Goals Exhibits compliance with exercise, home and travel O2 prescription;Verbalizes importance of monitoring SPO2 with pulse oximeter and return demonstration;Maintenance of O2  saturations>88%;Exhibits proper breathing techniques, such as pursed lip breathing or other method taught during program session;Compliance with respiratory medication           Oxygen Re-Evaluation:  Oxygen Re-Evaluation    Row Name 08/19/20 1613 09/16/20 1412 10/14/20 1545         Program Oxygen Prescription   Program Oxygen Prescription None None None           Home Oxygen   Home Oxygen Device None None None     Sleep Oxygen Prescription None None None     Home Exercise Oxygen Prescription None None None     Home Resting Oxygen Prescription None None None     Compliance with Home Oxygen Use Yes Yes Yes           Goals/Expected Outcomes   Short Term Goals To learn and exhibit compliance with exercise, home and travel O2 prescription;To learn and understand importance of monitoring SPO2 with pulse oximeter and demonstrate accurate use of the pulse oximeter.;To learn and understand importance of maintaining oxygen saturations>88%;To learn and demonstrate proper pursed lip breathing techniques or other breathing techniques.;To learn and demonstrate proper use of respiratory medications To learn and exhibit compliance with exercise, home and travel O2 prescription;To learn and understand importance of monitoring SPO2 with pulse oximeter and demonstrate accurate use of the pulse oximeter.;To learn and understand importance of maintaining oxygen saturations>88%;To learn and demonstrate proper pursed lip breathing techniques or other breathing techniques.;To learn and demonstrate proper use of respiratory medications To learn and exhibit compliance with exercise, home and travel O2 prescription;To learn and understand importance of monitoring SPO2 with pulse oximeter and demonstrate accurate use of the pulse oximeter.;To learn and understand importance of maintaining oxygen saturations>88%;To learn and demonstrate proper pursed lip breathing techniques or other breathing techniques.;To learn  and demonstrate proper use of respiratory medications     Long  Term Goals Exhibits compliance with exercise, home and travel O2 prescription;Verbalizes importance of monitoring SPO2 with pulse oximeter and return demonstration;Maintenance of O2 saturations>88%;Exhibits proper breathing techniques, such as pursed lip breathing or other method taught during program session;Compliance with respiratory medication Exhibits compliance with exercise, home and travel O2 prescription;Verbalizes importance of monitoring SPO2 with pulse oximeter and return demonstration;Maintenance of O2 saturations>88%;Exhibits proper breathing techniques, such as pursed lip breathing or other method taught during program session;Compliance with respiratory medication Exhibits compliance with exercise, home and travel O2 prescription;Verbalizes importance of monitoring SPO2 with pulse oximeter and return demonstration;Maintenance of O2 saturations>88%;Exhibits proper breathing techniques,  such as pursed lip breathing or other method taught during program session;Compliance with respiratory medication     Goals/Expected Outcomes compliance compliance --            Oxygen Discharge (Final Oxygen Re-Evaluation):  Oxygen Re-Evaluation - 10/14/20 1545      Program Oxygen Prescription   Program Oxygen Prescription None      Home Oxygen   Home Oxygen Device None    Sleep Oxygen Prescription None    Home Exercise Oxygen Prescription None    Home Resting Oxygen Prescription None    Compliance with Home Oxygen Use Yes      Goals/Expected Outcomes   Short Term Goals To learn and exhibit compliance with exercise, home and travel O2 prescription;To learn and understand importance of monitoring SPO2 with pulse oximeter and demonstrate accurate use of the pulse oximeter.;To learn and understand importance of maintaining oxygen saturations>88%;To learn and demonstrate proper pursed lip breathing techniques or other breathing  techniques.;To learn and demonstrate proper use of respiratory medications    Long  Term Goals Exhibits compliance with exercise, home and travel O2 prescription;Verbalizes importance of monitoring SPO2 with pulse oximeter and return demonstration;Maintenance of O2 saturations>88%;Exhibits proper breathing techniques, such as pursed lip breathing or other method taught during program session;Compliance with respiratory medication           Initial Exercise Prescription:  Initial Exercise Prescription - 08/08/20 1400      Date of Initial Exercise RX and Referring Provider   Date 08/08/20    Referring Provider Dr. Ander Slade    Expected Discharge Date 12/11/20      NuStep   Level 1    SPM 80    Minutes 22      Arm Ergometer   Level 1    RPM 45    Minutes 17      Prescription Details   Frequency (times per week) 2    Duration Progress to 30 minutes of continuous aerobic without signs/symptoms of physical distress      Intensity   THRR 40-80% of Max Heartrate 57-114    Ratings of Perceived Exertion 11-13    Perceived Dyspnea 0-4      Resistance Training   Training Prescription Yes    Weight 3 lbs    Reps 10-15           Perform Capillary Blood Glucose checks as needed.  Exercise Prescription Changes:   Exercise Prescription Changes    Row Name 08/19/20 1600 09/02/20 1400 09/16/20 1400 09/25/20 1400 10/14/20 1500     Response to Exercise   Blood Pressure (Admit) 110/68 112/60 112/68 112/68 108/60   Blood Pressure (Exercise) 122/70 124/64 118/70 128/70 128/76   Blood Pressure (Exit) 104/70 100/52 108/70 104/58 96/56   Heart Rate (Admit) 92 bpm 81 bpm 76 bpm 75 bpm 84 bpm   Heart Rate (Exercise) 93 bpm 95 bpm 87 bpm 87 bpm 92 bpm   Heart Rate (Exit) 87 bpm 85 bpm 78 bpm 82 bpm 81 bpm   Oxygen Saturation (Admit) 95 % 196 % 95 % 96 % 95 %   Oxygen Saturation (Exercise) 93 % 95 % 94 % 96 % 96 %   Oxygen Saturation (Exit) 95 % 95 % 97 % 96 % 95 %   Rating of Perceived  Exertion (Exercise) 14 12 11 10 11    Perceived Dyspnea (Exercise) 12 12 11 10 11    Duration Continue with 30 min of aerobic exercise without signs/symptoms of  physical distress. Continue with 30 min of aerobic exercise without signs/symptoms of physical distress. Continue with 30 min of aerobic exercise without signs/symptoms of physical distress. Continue with 30 min of aerobic exercise without signs/symptoms of physical distress. Continue with 30 min of aerobic exercise without signs/symptoms of physical distress.   Intensity THRR unchanged THRR unchanged THRR unchanged THRR unchanged THRR unchanged     Progression   Progression Continue to progress workloads to maintain intensity without signs/symptoms of physical distress. Continue to progress workloads to maintain intensity without signs/symptoms of physical distress. Continue to progress workloads to maintain intensity without signs/symptoms of physical distress. Continue to progress workloads to maintain intensity without signs/symptoms of physical distress. Continue to progress workloads to maintain intensity without signs/symptoms of physical distress.     Resistance Training   Training Prescription Yes Yes Yes Yes Yes   Weight 3 lbs 4 lbs 4 lbs 4 lbs 4 lbs   Reps 10-15 10-15 10-15 10-15 10-15   Time -- 10 Minutes 10 Minutes 10 Minutes 10 Minutes     NuStep   Level 1 1 2 2 2    SPM 84 89 84 94 90   Minutes 22 22 22 22 22    METs 2 2.1 2 2.1 2.1     Arm Ergometer   Level 1 1.5 1.5 1.9 1.5   RPM 53 52 57 24 59   Minutes 17 17 17 17 17    METs 1.8 1.8 2.1 2 2.1          Exercise Comments:   Exercise Goals and Review:   Exercise Goals    Row Name 08/08/20 1432 08/19/20 1616 09/16/20 1412 10/14/20 1542       Exercise Goals   Increase Physical Activity Yes Yes Yes Yes    Intervention Provide advice, education, support and counseling about physical activity/exercise needs.;Develop an individualized exercise prescription for  aerobic and resistive training based on initial evaluation findings, risk stratification, comorbidities and participant's personal goals. Provide advice, education, support and counseling about physical activity/exercise needs.;Develop an individualized exercise prescription for aerobic and resistive training based on initial evaluation findings, risk stratification, comorbidities and participant's personal goals. Provide advice, education, support and counseling about physical activity/exercise needs.;Develop an individualized exercise prescription for aerobic and resistive training based on initial evaluation findings, risk stratification, comorbidities and participant's personal goals. Provide advice, education, support and counseling about physical activity/exercise needs.;Develop an individualized exercise prescription for aerobic and resistive training based on initial evaluation findings, risk stratification, comorbidities and participant's personal goals.    Expected Outcomes Short Term: Attend rehab on a regular basis to increase amount of physical activity.;Long Term: Add in home exercise to make exercise part of routine and to increase amount of physical activity.;Long Term: Exercising regularly at least 3-5 days a week. Short Term: Attend rehab on a regular basis to increase amount of physical activity.;Long Term: Add in home exercise to make exercise part of routine and to increase amount of physical activity.;Long Term: Exercising regularly at least 3-5 days a week. Short Term: Attend rehab on a regular basis to increase amount of physical activity.;Long Term: Add in home exercise to make exercise part of routine and to increase amount of physical activity.;Long Term: Exercising regularly at least 3-5 days a week. Short Term: Attend rehab on a regular basis to increase amount of physical activity.;Long Term: Add in home exercise to make exercise part of routine and to increase amount of physical  activity.;Long Term: Exercising regularly at least  3-5 days a week.    Increase Strength and Stamina Yes Yes Yes Yes    Intervention Provide advice, education, support and counseling about physical activity/exercise needs.;Develop an individualized exercise prescription for aerobic and resistive training based on initial evaluation findings, risk stratification, comorbidities and participant's personal goals. Provide advice, education, support and counseling about physical activity/exercise needs.;Develop an individualized exercise prescription for aerobic and resistive training based on initial evaluation findings, risk stratification, comorbidities and participant's personal goals. Provide advice, education, support and counseling about physical activity/exercise needs.;Develop an individualized exercise prescription for aerobic and resistive training based on initial evaluation findings, risk stratification, comorbidities and participant's personal goals. Provide advice, education, support and counseling about physical activity/exercise needs.;Develop an individualized exercise prescription for aerobic and resistive training based on initial evaluation findings, risk stratification, comorbidities and participant's personal goals.    Expected Outcomes Short Term: Increase workloads from initial exercise prescription for resistance, speed, and METs.;Short Term: Perform resistance training exercises routinely during rehab and add in resistance training at home;Long Term: Improve cardiorespiratory fitness, muscular endurance and strength as measured by increased METs and functional capacity (6MWT) Short Term: Increase workloads from initial exercise prescription for resistance, speed, and METs.;Short Term: Perform resistance training exercises routinely during rehab and add in resistance training at home;Long Term: Improve cardiorespiratory fitness, muscular endurance and strength as measured by increased METs and  functional capacity (6MWT) Short Term: Increase workloads from initial exercise prescription for resistance, speed, and METs.;Short Term: Perform resistance training exercises routinely during rehab and add in resistance training at home;Long Term: Improve cardiorespiratory fitness, muscular endurance and strength as measured by increased METs and functional capacity (6MWT) Short Term: Increase workloads from initial exercise prescription for resistance, speed, and METs.;Short Term: Perform resistance training exercises routinely during rehab and add in resistance training at home;Long Term: Improve cardiorespiratory fitness, muscular endurance and strength as measured by increased METs and functional capacity (6MWT)    Able to understand and use rate of perceived exertion (RPE) scale Yes Yes Yes Yes    Intervention Provide education and explanation on how to use RPE scale Provide education and explanation on how to use RPE scale Provide education and explanation on how to use RPE scale Provide education and explanation on how to use RPE scale    Expected Outcomes Short Term: Able to use RPE daily in rehab to express subjective intensity level;Long Term:  Able to use RPE to guide intensity level when exercising independently Short Term: Able to use RPE daily in rehab to express subjective intensity level;Long Term:  Able to use RPE to guide intensity level when exercising independently Short Term: Able to use RPE daily in rehab to express subjective intensity level;Long Term:  Able to use RPE to guide intensity level when exercising independently Short Term: Able to use RPE daily in rehab to express subjective intensity level;Long Term:  Able to use RPE to guide intensity level when exercising independently    Able to understand and use Dyspnea scale Yes Yes Yes Yes    Intervention Provide education and explanation on how to use Dyspnea scale Provide education and explanation on how to use Dyspnea scale Provide  education and explanation on how to use Dyspnea scale Provide education and explanation on how to use Dyspnea scale    Expected Outcomes Short Term: Able to use Dyspnea scale daily in rehab to express subjective sense of shortness of breath during exertion;Long Term: Able to use Dyspnea scale to guide intensity level when exercising  independently Short Term: Able to use Dyspnea scale daily in rehab to express subjective sense of shortness of breath during exertion;Long Term: Able to use Dyspnea scale to guide intensity level when exercising independently Short Term: Able to use Dyspnea scale daily in rehab to express subjective sense of shortness of breath during exertion;Long Term: Able to use Dyspnea scale to guide intensity level when exercising independently Short Term: Able to use Dyspnea scale daily in rehab to express subjective sense of shortness of breath during exertion;Long Term: Able to use Dyspnea scale to guide intensity level when exercising independently    Knowledge and understanding of Target Heart Rate Range (THRR) Yes Yes Yes Yes    Intervention Provide education and explanation of THRR including how the numbers were predicted and where they are located for reference Provide education and explanation of THRR including how the numbers were predicted and where they are located for reference Provide education and explanation of THRR including how the numbers were predicted and where they are located for reference Provide education and explanation of THRR including how the numbers were predicted and where they are located for reference    Expected Outcomes Short Term: Able to state/look up THRR;Long Term: Able to use THRR to govern intensity when exercising independently;Short Term: Able to use daily as guideline for intensity in rehab Short Term: Able to state/look up THRR;Long Term: Able to use THRR to govern intensity when exercising independently;Short Term: Able to use daily as guideline for  intensity in rehab Short Term: Able to state/look up THRR;Long Term: Able to use THRR to govern intensity when exercising independently;Short Term: Able to use daily as guideline for intensity in rehab Short Term: Able to state/look up THRR;Long Term: Able to use THRR to govern intensity when exercising independently;Short Term: Able to use daily as guideline for intensity in rehab    Understanding of Exercise Prescription Yes Yes Yes Yes    Intervention Provide education, explanation, and written materials on patient's individual exercise prescription Provide education, explanation, and written materials on patient's individual exercise prescription Provide education, explanation, and written materials on patient's individual exercise prescription Provide education, explanation, and written materials on patient's individual exercise prescription    Expected Outcomes Short Term: Able to explain program exercise prescription;Long Term: Able to explain home exercise prescription to exercise independently Short Term: Able to explain program exercise prescription;Long Term: Able to explain home exercise prescription to exercise independently Short Term: Able to explain program exercise prescription;Long Term: Able to explain home exercise prescription to exercise independently Short Term: Able to explain program exercise prescription;Long Term: Able to explain home exercise prescription to exercise independently           Exercise Goals Re-Evaluation :  Exercise Goals Re-Evaluation    [place] Name 08/19/20 1616 09/16/20 1419 10/14/20 1542         Exercise Goal Re-Evaluation   Exercise Goals Review Increase Physical Activity;Increase Strength and Stamina;Able to understand and use rate of perceived exertion (RPE) scale;Able to understand and use Dyspnea scale;Knowledge and understanding of Target Heart Rate Range (THRR);Understanding of Exercise Prescription Increase Physical Activity;Increase Strength and  Stamina;Able to understand and use rate of perceived exertion (RPE) scale;Able to understand and use Dyspnea scale;Knowledge and understanding of Target Heart Rate Range (THRR);Understanding of Exercise Prescription Increase Physical Activity;Increase Strength and Stamina;Able to understand and use rate of perceived exertion (RPE) scale;Able to understand and use Dyspnea scale;Knowledge and understanding of Target Heart Rate Range (THRR);Understanding of Exercise Prescription  Comments Pt has attended 3 rehab sessions. He has been able to tolerate exercise well and should progress well through the program. He currently exercises at 2.0 METs on the stepper. Will continue to monitor and progress as able. Patient has completed 11 exercise sessions. He has tolerated exercise well with no complaints. He is making progress in the program and is enjoying the program. He is very interested in tracking his progress and learning how we increase his intensities. He is very positive to have in class and is very interactive with the staff and others in his class. He is currently exercising at 2.0 METs on the NuStep. Will continue to monitor and progress as able. patient has completed 15 exercise sessions. He has tolerated exercise well and is progressing slowly. He is very eager to progress and eager to learn about hisprogress. He asks questions frequently. He is very positive in class and is very interactive with the staff and others in his class. he is currently exercising at 2.1 METs on the Nustep. Will continue to monitor and progress as able.     Expected Outcomes Through exercise at rehab and through engaging in a home exercise program, the patient will meet their stated goals. Through exercise at rehab and through engaging in a home exercise program, the patient will meet their stated goals. Through exercise at rehab and through engaging in a home exercise program, the patient will meet their stated goals.             Discharge Exercise Prescription (Final Exercise Prescription Changes):  Exercise Prescription Changes - 10/14/20 1500      Response to Exercise   Blood Pressure (Admit) 108/60    Blood Pressure (Exercise) 128/76    Blood Pressure (Exit) 96/56    Heart Rate (Admit) 84 bpm    Heart Rate (Exercise) 92 bpm    Heart Rate (Exit) 81 bpm    Oxygen Saturation (Admit) 95 %    Oxygen Saturation (Exercise) 96 %    Oxygen Saturation (Exit) 95 %    Rating of Perceived Exertion (Exercise) 11    Perceived Dyspnea (Exercise) 11    Duration Continue with 30 min of aerobic exercise without signs/symptoms of physical distress.    Intensity THRR unchanged      Progression   Progression Continue to progress workloads to maintain intensity without signs/symptoms of physical distress.      Resistance Training   Training Prescription Yes    Weight 4 lbs    Reps 10-15    Time 10 Minutes      NuStep   Level 2    SPM 90    Minutes 22    METs 2.1      Arm Ergometer   Level 1.5    RPM 59    Minutes 17    METs 2.1           Nutrition:  Target Goals: Understanding of nutrition guidelines, daily intake of sodium 1500mg , cholesterol 200mg , calories 30% from fat and 7% or less from saturated fats, daily to have 5 or more servings of fruits and vegetables.  Biometrics:  Pre Biometrics - 10/14/20 1545      Pre Biometrics   Weight 90.6 kg    BMI (Calculated) 26.36            Nutrition Therapy Plan and Nutrition Goals:  Nutrition Therapy & Goals - 09/08/20 1413      Personal Nutrition Goals   Comments We will  continue to provide nutritional education and monitor.      Intervention Plan   Intervention Nutrition handout(s) given to patient.           Nutrition Assessments:  Nutrition Assessments - 08/08/20 1256      MEDFICTS Scores   Post Score 84          MEDIFICTS Score Key:  ?70 Need to make dietary changes   40-70 Heart Healthy Diet  ? 40 Therapeutic Level  Cholesterol Diet   Picture Your Plate Scores:  <45 Unhealthy dietary pattern with much room for improvement.  41-50 Dietary pattern unlikely to meet recommendations for good health and room for improvement.  51-60 More healthful dietary pattern, with some room for improvement.   >60 Healthy dietary pattern, although there may be some specific behaviors that could be improved.    Nutrition Goals Re-Evaluation:   Nutrition Goals Discharge (Final Nutrition Goals Re-Evaluation):   Psychosocial: Target Goals: Acknowledge presence or absence of significant depression and/or stress, maximize coping skills, provide positive support system. Participant is able to verbalize types and ability to use techniques and skills needed for reducing stress and depression.  Initial Review & Psychosocial Screening:  Initial Psych Review & Screening - 08/08/20 1255      Initial Review   Current issues with Current Stress Concerns    Source of Stress Concerns Family    Comments Grieving the death of his wife.      Family Dynamics   Good Support System? Yes    Comments His two children and his grandchildren are his support system.      Barriers   Psychosocial barriers to participate in program The patient should benefit from training in stress management and relaxation.      Screening Interventions   Interventions Encouraged to exercise;Provide feedback about the scores to participant;To provide support and resources with identified psychosocial needs    Expected Outcomes Long Term Goal: Stressors or current issues are controlled or eliminated.;Short Term goal: Identification and review with participant of any Quality of Life or Depression concerns found by scoring the questionnaire.;Long Term goal: The participant improves quality of Life and PHQ9 Scores as seen by post scores and/or verbalization of changes           Quality of Life Scores:  Quality of Life - 08/08/20 1435      Quality of  Life   Select Quality of Life      Quality of Life Scores   Health/Function Pre 9.34 %    Socioeconomic Pre 16.4 %    Psych/Spiritual Pre 15.08 %    Family Pre 23.63 %    GLOBAL Pre 13.44 %          Scores of 19 and below usually indicate a poorer quality of life in these areas.  A difference of  2-3 points is a clinically meaningful difference.  A difference of 2-3 points in the total score of the Quality of Life Index has been associated with significant improvement in overall quality of life, self-image, physical symptoms, and general health in studies assessing change in quality of life.   PHQ-9: Recent Review Flowsheet Data    Depression screen [facility] 2/9 08/08/2020 06/25/2020 02/13/2019   Decreased Interest 3  0 0   Down, Depressed, Hopeless 0 0 0   PHQ - 2 Score 3 0 0   Altered sleeping 0 - -   Tired, decreased energy 3 - -   Change in appetite  0 - -   Feeling bad or failure about yourself  0 - -   Trouble concentrating 0 - -   Moving slowly or fidgety/restless 0 - -   Suicidal thoughts 0 - -   PHQ-9 Score 6 - -   Difficult doing work/chores Somewhat difficult - -     Interpretation of Total Score  Total Score Depression Severity:  1-4 = Minimal depression, 5-9 = Mild depression, 10-14 = Moderate depression, 15-19 = Moderately severe depression, 20-27 = Severe depression   Psychosocial Evaluation and Intervention:  Psychosocial Evaluation - 08/08/20 1438      Psychosocial Evaluation & Interventions   Interventions Stress management education;Relaxation education;Encouraged to exercise with the program and follow exercise prescription    Comments Pt has no barriers to participating in pulmonary rehab. He is still grieving the death of his wife who died 3 years ago. They were married for 58 years. He reports that he no longer enjoys doing a lot of the things that he used to due to his sadness. He stated that he tried to attend some grief support groups at first, but he states  he thought that they were not beneficial at all. He was defensive when talking about the subject of his wife's death and when I asked if he needed to talk to a councelor. He states that he will cope on his own, but I do think he would benefit from professional support. He reports that he has a good support system now with his 2 children and his grandchildren. A lot of his other problems stem from his wife's death. He reports that she did all of the cooking and he had never learned to cook, so now his diet has gotten unhealthy. Additionally, he states that he let himself go physically after her death. This has made the SOB from his COPD even worse. He scored a 6 on his PHQ-9 and a 13.44 on his QOL. His decreased QOL is associated with his grieving and his health. He does have optimism that the pulmonary rehab program will better his health and help him breath better. He will benefit from stress management education, relaxation education, and overall social support from staff and other patients.    Expected Outcomes Through education and exercise, the patient's stressors will be reduced.    Continue Psychosocial Services  Follow up required by staff           Psychosocial Re-Evaluation:  Psychosocial Re-Evaluation    [place] Name 08/13/20 1343 09/08/20 1408 10/08/20 1246         Psychosocial Re-Evaluation   Current issues with None Identified None Identified None Identified     Comments Patient is new in program . His initial QOL score is 13.44 and his PHQ-9 was 6. He seems to enjoy coming and interacting with other group members and staff. He was referred to pulmonary rehav by Dr. Ander Slade with a diagnosis of COPDwith acute exacerbation.  His personal goals for the program are to improve SOB, lose weight and get stronger.  We will continue to monitor as he works toward meeting these goals. Patient has completed 8 sessions.   He seems to enjoy coming to class and interacting with other group members and  staff.  His personal goals for the program are to improve SOB, lose weight and get stronger.  We will continue to monitor as he works toward meeting these goals.  He is doing well in the program with progression and consistent  attendance. Patient has completed 14 sessions.  He continues to have no psychosocial issues identified. He continues to enjoy coming to class and interacting with other group members and staff. We will continue to monitor.     Expected Outcomes Patient will have no psychosocial barriers identified at discharge. Patient will have no psychosocial barriers identified at discharge. Patient will have no psychosocial barriers identified at discharge.     Interventions Stress management education;Relaxation education;Encouraged to attend Pulmonary Rehabilitation for the exercise Stress management education;Relaxation education;Encouraged to attend Pulmonary Rehabilitation for the exercise Stress management education;Relaxation education;Encouraged to attend Pulmonary Rehabilitation for the exercise     Continue Psychosocial Services  No Follow up required No Follow up required No Follow up required            Psychosocial Discharge (Final Psychosocial Re-Evaluation):  Psychosocial Re-Evaluation - 10/08/20 1246      Psychosocial Re-Evaluation   Current issues with None Identified    Comments Patient has completed 14 sessions.  He continues to have no psychosocial issues identified. He continues to enjoy coming to class and interacting with other group members and staff. We will continue to monitor.    Expected Outcomes Patient will have no psychosocial barriers identified at discharge.    Interventions Stress management education;Relaxation education;Encouraged to attend Pulmonary Rehabilitation for the exercise    Continue Psychosocial Services  No Follow up required            Education: Education Goals: Education classes will be provided on a weekly basis, covering required  topics. Participant will state understanding/return demonstration of topics presented.  Learning Barriers/Preferences:  Learning Barriers/Preferences - 08/08/20 1304      Learning Barriers/Preferences   Learning Barriers Hearing    Learning Preferences Written Material;Audio;Skilled Demonstration           Education Topics: How Lungs Work and Diseases: - Discuss the anatomy of the lungs and diseases that can affect the lungs, such as COPD. Flowsheet Row PULMONARY REHAB CHRONIC OBSTRUCTIVE PULMONARY DISEASE from 09/25/2020 in [place]  Date 08/21/20  Educator DJ  Instruction Review Code 1- Verbalizes Understanding      Exercise: -Discuss the importance of exercise, FITT principles of exercise, normal and abnormal responses to exercise, and how to exercise safely.   Environmental Irritants: -Discuss types of environmental irritants and how to limit exposure to environmental irritants. Flowsheet Row PULMONARY REHAB CHRONIC OBSTRUCTIVE PULMONARY DISEASE from 09/25/2020 in [place]  Date 08/28/20  Educator [facility] [place]  Instruction Review Code 1- Verbalizes Understanding      Meds/Inhalers and oxygen: - Discuss respiratory medications, definition of an inhaler and oxygen, and the proper way to use an inhaler and oxygen. Flowsheet Row PULMONARY REHAB CHRONIC OBSTRUCTIVE PULMONARY DISEASE from 09/25/2020 in [place]  Date 09/04/20  Educator [facility]      Energy Saving Techniques: - Discuss methods to conserve energy and decrease shortness of breath when performing activities of daily living.  Flowsheet Row PULMONARY REHAB CHRONIC OBSTRUCTIVE PULMONARY DISEASE from 09/25/2020 in [place]  Date 09/11/20  Educator mk  Instruction Review Code 2- Demonstrated Understanding      Bronchial Hygiene / Breathing Techniques: - Discuss breathing mechanics, pursed-lip breathing technique,  proper  posture, effective ways to clear airways, and other functional breathing techniques   Cleaning Equipment: - Provides group verbal and written instruction about the health risks of elevated stress, cause of high stress, and healthy ways to reduce stress. Flowsheet  Row PULMONARY REHAB CHRONIC OBSTRUCTIVE PULMONARY DISEASE from 09/25/2020 in [place]  Date 09/25/20  Educator pb  Instruction Review Code 2- Demonstrated Understanding      Nutrition I: Fats: - Discuss the types of cholesterol, what cholesterol does to the body, and how cholesterol levels can be controlled.   Nutrition II: Labels: -Discuss the different components of food labels and how to read food labels.   Respiratory Infections: - Discuss the signs and symptoms of respiratory infections, ways to prevent respiratory infections, and the importance of seeking medical treatment when having a respiratory infection.   Stress I: Signs and Symptoms: - Discuss the causes of stress, how stress may lead to anxiety and depression, and ways to limit stress.   Stress II: Relaxation: -Discuss relaxation techniques to limit stress.   Oxygen for Home/Travel: - Discuss how to prepare for travel when on oxygen and proper ways to transport and store oxygen to ensure safety.   Knowledge Questionnaire Score:  Knowledge Questionnaire Score - 08/08/20 1304      Knowledge Questionnaire Score   Pre Score 14/18           Core Components/Risk Factors/Patient Goals at Admission:  Personal Goals and Risk Factors at Admission - 08/08/20 1304      Core Components/Risk Factors/Patient Goals on Admission    Weight Management Yes;Weight Loss    Intervention Weight Management: Develop a combined nutrition and exercise program designed to reach desired caloric intake, while maintaining appropriate intake of nutrient and fiber, sodium and fats, and appropriate energy expenditure required for the weight goal.;Weight  Management: Provide education and appropriate resources to help participant work on and attain dietary goals.;Weight Management/Obesity: Establish reasonable short term and long term weight goals.;Obesity: Provide education and appropriate resources to help participant work on and attain dietary goals.    Expected Outcomes Short Term: Continue to assess and modify interventions until short term weight is achieved;Long Term: Adherence to nutrition and physical activity/exercise program aimed toward attainment of established weight goal;Weight Loss: Understanding of general recommendations for a balanced deficit meal plan, which promotes 1-2 lb weight loss per week and includes a negative energy balance of [phone] kcal/d;Understanding recommendations for meals to include 15-35% energy as protein, 25-35% energy from fat, 35-60% energy from carbohydrates, less than 200mg  of dietary cholesterol, 20-35 gm of total fiber daily;Understanding of distribution of calorie intake throughout the day with the consumption of 4-5 meals/snacks    Improve shortness of breath with ADL's Yes    Intervention Provide education, individualized exercise plan and daily activity instruction to help decrease symptoms of SOB with activities of daily living.    Expected Outcomes Short Term: Improve cardiorespiratory fitness to achieve a reduction of symptoms when performing ADLs;Long Term: Be able to perform more ADLs without symptoms or delay the onset of symptoms    Stress Yes    Intervention Offer individual and/or small group education and counseling on adjustment to heart disease, stress management and health-related lifestyle change. Teach and support self-help strategies.;Refer participants experiencing significant psychosocial distress to appropriate mental health specialists for further evaluation and treatment. When possible, include family members and significant others in education/counseling sessions.    Expected Outcomes  Long Term: Emotional wellbeing is indicated by absence of clinically significant psychosocial distress or social isolation.;Short Term: Participant demonstrates changes in health-related behavior, relaxation and other stress management skills, ability to obtain effective social support, and compliance with psychotropic medications if prescribed.  Core Components/Risk Factors/Patient Goals Review:   Goals and Risk Factor Review    Row Name 08/13/20 1357 09/08/20 1415 10/08/20 1248         Core Components/Risk Factors/Patient Goals Review   Personal Goals Review Improve shortness of breath with ADL's Improve shortness of breath with ADL's Improve shortness of breath with ADL's     Review Patient is new in the program.  His O2 sats was good on room air at 95-96% while exercising.  He seems to have a positive outlook and very interative with others in class.  His personal goals are to get stronger and have more energy and increase ADL's.  We will continue to monitor as he works toward meeting these goals. Patient has completed 8 sessions.  His O2 sats was good on room air at 93-96% while exercising. Had appointment with Dr. Luna Glasgow on 08-21-20. He continues to have pain in left shoulder. no changes made in medication.  Continue on current medication regimen. He seems to have a positive outlook and very interative with others in class.  His personal goals are to get stronger and have more energy and increase ADL's.  We will continue to monitor as he works toward meeting these goals. Patient has completed 14 sessions gaining 1 lb since last 30 day review. He continue to do well in the program with consistent attendance and progressions. His blood pressure is well controlled and his O2 saturations are averaging 96% on RA while exercising. He saw his pulmonologist 5/5 for a routine check. No changes made. Patient told MD that PR was benefiting him. He continue sto have pain in his left shoulder and  plans to start PT for this after he completes PR. His personal goals for the program are to lose weight; have less SOB; and get stronger. We will continue to monitor as he works toaward meeting these goals.     Expected Outcomes Patient will complete the program meeting both program and personal goals. Patient will complete the program meeting both program and personal goals. Patient will complete the program meeting both program and personal goals.            Core Components/Risk Factors/Patient Goals at Discharge (Final Review):   Goals and Risk Factor Review - 10/08/20 1248      Core Components/Risk Factors/Patient Goals Review   Personal Goals Review Improve shortness of breath with ADL's    Review Patient has completed 14 sessions gaining 1 lb since last 30 day review. He continue to do well in the program with consistent attendance and progressions. His blood pressure is well controlled and his O2 saturations are averaging 96% on RA while exercising. He saw his pulmonologist 5/5 for a routine check. No changes made. Patient told MD that PR was benefiting him. He continue sto have pain in his left shoulder and plans to start PT for this after he completes PR. His personal goals for the program are to lose weight; have less SOB; and get stronger. We will continue to monitor as he works toaward meeting these goals.    Expected Outcomes Patient will complete the program meeting both program and personal goals.           ITP Comments:   Comments: ITP REVIEW Pt is making expected progress toward pulmonary rehab goals after completing 16 sessions. Recommend continued exercise, life style modification, education, and utilization of breathing techniques to increase stamina and strength and decrease shortness of breath with exertion.

## 2020-10-16 NOTE — Progress Notes (Signed)
Daily Session Note  Patient Details  Name: [name] [name] MRN: [id number] Date of Birth: [birth year] Referring Provider:   Flowsheet Row PULMONARY REHAB OTHER RESP ORIENTATION from 08/08/2020 in [place]  Referring Provider Dr. Ander Slade      Encounter Date: 10/16/2020  Check In:  Session Check In - 10/16/20 1330      Check-In   Supervising physician immediately available to respond to emergencies [facility] MD immediately available    Physician(s) Dr. Harl Bowie    Location [facility]    Staff Present Geanie Cooley, RN;Debra Wynetta Emery, RN, BSN    Virtual Visit No    Medication changes reported     No    Fall or balance concerns reported    No    Tobacco Cessation No Change    Warm-up and Cool-down Performed as group-led instruction    Resistance Training Performed Yes    VAD Patient? No    PAD/SET Patient? No      Pain Assessment   Currently in Pain? No/denies    Pain Score 0-No pain    Multiple Pain Sites No           Capillary Blood Glucose: No results found for this or any previous visit (from the past 24 hour(s)).    Social History   Tobacco Use  Smoking Status Former Smoker  . Packs/day: 1.00  . Years: 35.00  . Pack years: 35.00  . Types: Cigarettes  . Quit date: 03/28/2003  . Years since quitting: 17.5  Smokeless Tobacco Never Used    Goals Met:  Independence with exercise equipment Using PLB without cueing & demonstrates good technique Exercise tolerated well No report of cardiac concerns or symptoms Strength training completed today  Goals Unmet:  Not Applicable  Comments: check out @ 2:30   Dr. Kathie Dike is Medical Director for [facility].

## 2020-10-21 ENCOUNTER — Encounter (HOSPITAL_COMMUNITY)
Admission: RE | Admit: 2020-10-21 | Discharge: 2020-10-21 | Disposition: A | Discharge status: Home / Self Care | Payer: Medicare Other | Source: Ambulatory Visit | Attending: Pulmonary Disease | Admitting: Pulmonary Disease

## 2020-10-21 ENCOUNTER — Encounter (HOSPITAL_COMMUNITY): Payer: Medicare Other

## 2020-10-21 ENCOUNTER — Other Ambulatory Visit: Payer: Self-pay

## 2020-10-21 ENCOUNTER — Telehealth: Payer: Self-pay | Admitting: Pulmonary Disease

## 2020-10-21 DIAGNOSIS — J441 Chronic obstructive pulmonary disease with (acute) exacerbation: Secondary | ICD-10-CM | POA: Diagnosis not present

## 2020-10-21 MED ORDER — PREDNISONE 20 MG PO TABS: 20.0000 mg | ORAL_TABLET | Freq: Every day | ORAL | 0 refills | Status: DC

## 2020-10-21 MED ORDER — AZITHROMYCIN 250 MG PO TABS: ORAL_TABLET | ORAL | 0 refills | Status: DC

## 2020-10-21 NOTE — Progress Notes (Signed)
Daily Session Note  Patient Details  Name: [name] [name] MRN: [id number] Date of Birth: December 24, 1940 Referring Provider:   Flowsheet Row PULMONARY REHAB OTHER RESP ORIENTATION from 08/08/2020 in [place]  Referring Provider Dr. Ander Slade      Encounter Date: 10/21/2020  Check In:  Session Check In - 10/21/20 1330      Check-In   Supervising physician immediately available to respond to emergencies [facility] immediately available    Physician(s) Dr. Harrington Challenger    Location [facility]    Staff Present Geanie Cooley, RN;Madison Audria Nine, MS, Exercise Physiologist    Virtual Visit No    Medication changes reported     No    Fall or balance concerns reported    No    Tobacco Cessation No Change    Warm-up and Cool-down Performed as group-led instruction    VAD Patient? No    PAD/SET Patient? No      Pain Assessment   Currently in Pain? No/denies    Pain Score 0-No pain    Multiple Pain Sites No           Capillary Blood Glucose: No results found for this or any previous visit (from the past 24 hour(s)).    Social History   Tobacco Use  Smoking Status Former Smoker  . Packs/day: 1.00  . Years: 35.00  . Pack years: 35.00  . Types: Cigarettes  . Quit date: 03/28/2003  . Years since quitting: 17.5  Smokeless Tobacco Never Used    Goals Met:  Proper associated with RPD/PD & O2 Sat Independence with exercise equipment Using PLB without cueing & demonstrates good technique Exercise tolerated well No report of cardiac concerns or symptoms Strength training completed today  Goals Unmet:  Not Applicable  Comments: check out @2 :30pm   Dr. Kathie Dike is Medical Director for [facility].

## 2020-10-21 NOTE — Telephone Encounter (Signed)
Called [name] [name] and he stated to give the pt doxy 100 mg  1 po bid x 10 days and prednisone 20 mg  1 daily for 5 days.   I called the pt to make him aware and he stated that he is allergic to tetracycline and did not think he could take that.  He asked if we could change that to a zpak.  He stated that he has taken that before and did well with it.  MW please advise since [name] [name] is not available now.  Thanks  Allergies  Allergen Reactions  . Beta Adrenergic Blockers Diarrhea and Other (See Comments)    Does not recall an allergy  . Lasix [Furosemide] Rash  . Cefzil [Cefprozil] Nausea Only    Patient does recall allergy  . Dexamethasone Swelling  . Gabapentin Swelling  . Neomycin Other (See Comments)  . Tetracyclines & Related Itching  . Ciprofloxacin Nausea And Vomiting, Rash and Other (See Comments)    Body aches   . Methocarbamol Swelling and Rash  . Penicillins Swelling and Rash    Has patient had a PCN reaction causing immediate rash, facial/tongue/throat swelling, SOB or lightheadedness with hypotension: yes Has patient had a PCN reaction causing severe rash involving mucus membranes or skin necrosis: unknown Has patient had a PCN reaction that required hospitalization: no Has patient had a PCN reaction occurring within the last 10 years: No If all of the above answers are "NO", then may proceed with Cephalosporin use.

## 2020-10-21 NOTE — Telephone Encounter (Signed)
meds have been sent in to the pharmacy and pt is already aware.

## 2020-10-21 NOTE — Telephone Encounter (Signed)
Called and spoke with Patient.  Patient stated when he was last seen 09/18/20, Dr. Ander Slade advised him to call with any problems.  Patient stated he started having a cough a week ago, that has become productive.  Patient stated he is coughing yellow to dark yellow sputum.  Patient stated mostly bothers him in the morning and at bedtime. Patient stated he does feel some sob at times. Patient denies fever, chills, or other symptoms.  Patient stated he is able to do pulmonary rehab and feels that has helped his breathing. Patient request prescriptions to go to [facility].   Message routed to Dr. Ander Slade to advise   09/18/20 OV AVS-  Instructions  No changes to your treatment regimen Continue using nebulization treatment on Flovent twice a day  Mucinex as needed  Call with any significant concerns  I will see you in about 4 months from now

## 2020-10-21 NOTE — Telephone Encounter (Signed)
zpak is fine

## 2020-10-21 NOTE — Progress Notes (Signed)
I have reviewed a Home Exercise Prescription with [name] . [name] [name] is not currently exercising at home.  The patient was advised to walk 2 days a week for 20 minutes outside of rehab.  [name] and I discussed how to progress their exercise prescription.  The patient stated that their goals were to be as fit as he can be for his age and to be able to be active outside without getting short of breath.  The patient stated that they understand the exercise prescription.  We reviewed exercise guidelines, target heart rate during exercise, RPE Scale, weather conditions, NTG use, endpoints for exercise, warmup and cool down.  Patient is encouraged to come to me with any questions. I will continue to follow up with the patient to assist them with progression and safety.

## 2020-10-23 ENCOUNTER — Encounter (HOSPITAL_COMMUNITY)
Admission: RE | Admit: 2020-10-23 | Discharge: 2020-10-23 | Disposition: A | Discharge status: Home / Self Care | Payer: Medicare Other | Source: Ambulatory Visit | Attending: Pulmonary Disease | Admitting: Pulmonary Disease

## 2020-10-23 ENCOUNTER — Other Ambulatory Visit: Payer: Self-pay

## 2020-10-23 ENCOUNTER — Ambulatory Visit (INDEPENDENT_AMBULATORY_CARE_PROVIDER_SITE_OTHER): Payer: Medicare Other | Admitting: Family Medicine

## 2020-10-23 ENCOUNTER — Encounter: Payer: Self-pay | Admitting: Family Medicine

## 2020-10-23 VITALS — BP 118/70 | HR 105 | Temp 97.3°F | Ht 73.0 in | Wt 200.0 lb

## 2020-10-23 DIAGNOSIS — I10 Essential (primary) hypertension: Secondary | ICD-10-CM | POA: Diagnosis not present

## 2020-10-23 DIAGNOSIS — E785 Hyperlipidemia, unspecified: Secondary | ICD-10-CM | POA: Diagnosis not present

## 2020-10-23 DIAGNOSIS — G4700 Insomnia, unspecified: Secondary | ICD-10-CM | POA: Diagnosis not present

## 2020-10-23 DIAGNOSIS — J441 Chronic obstructive pulmonary disease with (acute) exacerbation: Secondary | ICD-10-CM

## 2020-10-23 MED ORDER — LORAZEPAM 0.5 MG PO TABS: 0.5000 mg | ORAL_TABLET | Freq: Every day | ORAL | 3 refills | Status: DC

## 2020-10-23 MED ORDER — LOVASTATIN 40 MG PO TABS: 40.0000 mg | ORAL_TABLET | Freq: Every day | ORAL | 1 refills | Status: DC

## 2020-10-23 MED ORDER — LOSARTAN POTASSIUM 25 MG PO TABS: 25.0000 mg | ORAL_TABLET | Freq: Every day | ORAL | 1 refills | Status: DC

## 2020-10-23 NOTE — Progress Notes (Signed)
Daily Session Note  Patient Details  Name: [name] [name] MRN: [id number] Date of Birth: May 05, 1941 Referring Provider:   Flowsheet Row PULMONARY REHAB OTHER RESP ORIENTATION from 08/08/2020 in [place]  Referring Provider Dr. Ander Slade       Encounter Date: 10/23/2020  Check In:  Session Check In - 10/23/20 1330       Check-In   Supervising physician immediately available to respond to emergencies [facility] immediately available    Physician(s) Dr. Domenic Polite    Location [facility]    Staff Present Cathren Harsh, MS, Exercise Physiologist    Virtual Visit No    Medication changes reported     No    Fall or balance concerns reported    No    Tobacco Cessation No Change    Warm-up and Cool-down Performed as group-led instruction    Resistance Training Performed Yes    VAD Patient? No    PAD/SET Patient? No      Pain Assessment   Currently in Pain? No/denies    Pain Score 0-No pain    Multiple Pain Sites No             Capillary Blood Glucose: No results found for this or any previous visit (from the past 24 hour(s)).    Social History   Tobacco Use  Smoking Status Former   Packs/day: 1.00   Years: 35.00   Pack years: 35.00   Types: Cigarettes   Quit date: 03/28/2003   Years since quitting: 17.5  Smokeless Tobacco Never    Goals Met:  Proper associated with RPD/PD & O2 Sat Independence with exercise equipment Using PLB without cueing & demonstrates good technique Exercise tolerated well No report of cardiac concerns or symptoms Strength training completed today  Goals Unmet:  Not Applicable  Comments: check out 1430   Dr. Kathie Dike is Medical Director for [facility].

## 2020-10-23 NOTE — Progress Notes (Signed)
Patient ID: [name] [name], male    DOB: 09/01/1940, 80 y.o.   MRN: [id number]   Chief Complaint  Patient presents with   Hypertension   Insomnia    Subjective:    HPI CC-med check up on htn and insomnia.   Insomnia- stable.  Takes lorazepam for sleep and works well.  Not having memory issues or falls.  Not eating a good diet and feeling low energy. Over 10 yrs ago had anemia. Not seeing black or bloody stools.  Normal Hb.  Drinking boost daily.  In the pulmonary class 2x per week and working in yard.  Cardiac rehab class and exercising. About 5mins exercising.  2 days ago started on azithromycin and prednisone- seen pulm and started this. Has been helping and prednisone is helping.  HTN Pt compliant with BP meds.  No SEs Denies chest pain, sob, LE swelling, or blurry vision.  HLD- doing well no new concerns.  Compliant with meds. No chest pain, palpitations, myalgias or joint pains.   Medical History   has a past medical history of AAA (abdominal aortic aneurysm) ([place]), Allergy, Anemia, Arthritis, Asthma, BCC (basal cell carcinoma) (08/18/1989), BCC (basal cell carcinoma) (01/31/1992), BCC (basal cell carcinoma) (11/22/2001), BCC (basal cell carcinoma) (10/09/2003), BCC (basal cell carcinoma) (08/15/2008), BPH (benign prostatic hyperplasia), CAD (coronary artery disease), Cancer ([place]), COPD (chronic obstructive pulmonary disease) ([place]), Dysrhythmia, GERD (gastroesophageal reflux disease), Glaucoma, HOH (hard of hearing), Hypercholesterolemia, Hypertension, Impaired fasting glucose, Low back pain, Melanoma in situ ([place]) (10/09/2003), MI (myocardial infarction) ([place]) (1999), SCC (squamous cell carcinoma) (07/03/2014), SCC (squamous cell carcinoma) (07/03/2014), SCC (squamous cell carcinoma) (07/20/2017), SCC (squamous cell carcinoma) (01/10/2019), SCC (squamous cell carcinoma) (11/22/2001), SCC (squamous cell carcinoma) (10/09/2003), SCC (squamous cell carcinoma)  (03/30/2004), SCC (squamous cell carcinoma) (03/08/2005), SCC (squamous cell carcinoma) (06/08/2006), SCC (squamous cell carcinoma) (05/05/2010), SCC (squamous cell carcinoma) (09/13/2013), and Thrush.     Review of Systems  Constitutional:  Negative for chills and fever.  HENT:  Negative for congestion, rhinorrhea and sore throat.   Respiratory:  Negative for cough, shortness of breath and wheezing.   Cardiovascular:  Negative for chest pain and leg swelling.  Gastrointestinal:  Negative for abdominal pain, diarrhea, nausea and vomiting.  Genitourinary:  Negative for dysuria and frequency.  Skin:  Negative for rash.  Neurological:  Negative for dizziness, weakness and headaches.    Vitals BP 118/70   Pulse (!) 105   Temp (!) 97.3 F (36.3 C)   Ht 6\' 1"  (1.854 m)   Wt 200 lb (90.7 kg)   SpO2 98%   BMI 26.39 kg/m   Objective:   Physical Exam Vitals and nursing note reviewed.  Constitutional:      General: He is not in acute distress.    Appearance: Normal appearance. He is not ill-appearing.  HENT:     Head: Normocephalic.     Nose: Nose normal. No congestion.     Mouth/Throat:     Mouth: Mucous membranes are moist.     Pharynx: No oropharyngeal exudate.  Eyes:     Extraocular Movements: Extraocular movements intact.     Conjunctiva/sclera: Conjunctivae normal.     Pupils: Pupils are equal, round, and reactive to light.  Cardiovascular:     Rate and Rhythm: Normal rate and regular rhythm.     Pulses: Normal pulses.     Heart sounds: Normal heart sounds. No murmur heard. Pulmonary:     Effort: Pulmonary effort is normal.  Breath sounds: Normal breath sounds. No wheezing, rhonchi or rales.  Musculoskeletal:        General: Normal range of motion.     Right lower leg: No edema.     Left lower leg: No edema.  Skin:    General: Skin is warm and dry.     Findings: No rash.  Neurological:     General: No focal deficit present.     Mental Status: He is alert and  oriented to person, place, and time.     Cranial Nerves: No cranial nerve deficit.  Psychiatric:        Mood and Affect: Mood normal.        Behavior: Behavior normal.        Thought Content: Thought content normal.        Judgment: Judgment normal.     Assessment and Plan   1. Insomnia, unspecified type - LORazepam (ATIVAN) 0.5 MG tablet; Take 1 tablet (0.5 mg total) by mouth at bedtime.  Dispense: 30 tablet; Refill: 3  2. Primary hypertension - losartan (COZAAR) 25 MG tablet; Take 1 tablet (25 mg total) by mouth daily.  Dispense: 90 tablet; Refill: 1  3. Hyperlipidemia, unspecified hyperlipidemia type - lovastatin (MEVACOR) 40 MG tablet; Take 1 tablet (40 mg total) by mouth daily.  Dispense: 90 tablet; Refill: 1   Insomnia- stable. Taking ativan.  Pt aware of the long term affects of taking this medication. And pt wanting to continue.  Htn- stable. Cont meds.  Hld- stable. Cont meds.   Return in about 6 months (around 04/24/2021) for f/u copd, htn, insomnia.   11/12/2020

## 2020-10-24 ENCOUNTER — Telehealth: Payer: Self-pay | Admitting: Cardiovascular Disease

## 2020-10-24 ENCOUNTER — Other Ambulatory Visit: Payer: Self-pay | Admitting: *Deleted

## 2020-10-24 DIAGNOSIS — R1907 Generalized intra-abdominal and pelvic swelling, mass and lump: Secondary | ICD-10-CM

## 2020-10-24 DIAGNOSIS — I714 Abdominal aortic aneurysm, without rupture, unspecified: Secondary | ICD-10-CM

## 2020-10-24 NOTE — Telephone Encounter (Signed)
Patient was calling in because he thought Dr. Johnsie Cancel wanted him to have abdominal aorta duplex done before his visit he has on June 22. Please advice

## 2020-10-24 NOTE — Telephone Encounter (Signed)
Pt aware needs abd ct per Dr Johnsie Cancel Order entered .Adonis Housekeeper

## 2020-10-28 ENCOUNTER — Encounter (HOSPITAL_COMMUNITY)
Admission: RE | Admit: 2020-10-28 | Discharge: 2020-10-28 | Disposition: A | Discharge status: Home / Self Care | Payer: Medicare Other | Source: Ambulatory Visit | Attending: Pulmonary Disease | Admitting: Pulmonary Disease

## 2020-10-28 ENCOUNTER — Other Ambulatory Visit: Payer: Self-pay

## 2020-10-28 DIAGNOSIS — J441 Chronic obstructive pulmonary disease with (acute) exacerbation: Secondary | ICD-10-CM | POA: Diagnosis not present

## 2020-10-28 NOTE — Progress Notes (Signed)
Daily Session Note  Patient Details  Name: [name] [name] MRN: [id number] Date of Birth: July 02, 1940 Referring Provider:   [place] from 08/08/2020 in [place]  Referring Provider Dr. Ander Slade       Encounter Date: 10/28/2020  Check In:  Session Check In - 10/28/20 1330       Check-In   Supervising physician immediately available to respond to emergencies [facility] MD immediately available    Physician(s) Dr. Harl Bowie    Location [facility]    Staff Present Geanie Cooley, RN;Dalton Kris Mouton, MS, ACSM-CEP, Exercise Physiologist    Virtual Visit No    Medication changes reported     No    Fall or balance concerns reported    No    Tobacco Cessation No Change    Warm-up and Cool-down Performed as group-led instruction    Resistance Training Performed Yes    VAD Patient? No    PAD/SET Patient? No      Pain Assessment   Currently in Pain? No/denies    Pain Score 0-No pain    Multiple Pain Sites No             Capillary Blood Glucose: No results found for this or any previous visit (from the past 24 hour(s)).    Social History   Tobacco Use  Smoking Status Former   Packs/day: 1.00   Years: 35.00   Pack years: 35.00   Types: Cigarettes   Quit date: 03/28/2003   Years since quitting: 17.6  Smokeless Tobacco Never    Goals Met:  Proper associated with RPD/PD & O2 Sat Independence with exercise equipment Using PLB without cueing & demonstrates good technique Exercise tolerated well No report of cardiac concerns or symptoms Strength training completed today  Goals Unmet:  Not Applicable  Comments: check out @ 2:30   Dr. Kathie Dike is Medical Director for [facility] Pulmonary Rehab.

## 2020-10-29 ENCOUNTER — Ambulatory Visit: Payer: Medicare Other | Admitting: Urology

## 2020-10-30 ENCOUNTER — Other Ambulatory Visit: Payer: Self-pay

## 2020-10-30 ENCOUNTER — Encounter (HOSPITAL_COMMUNITY)
Admission: RE | Admit: 2020-10-30 | Discharge: 2020-10-30 | Disposition: A | Discharge status: Home / Self Care | Payer: Medicare Other | Source: Ambulatory Visit | Attending: Pulmonary Disease | Admitting: Pulmonary Disease

## 2020-10-30 DIAGNOSIS — J441 Chronic obstructive pulmonary disease with (acute) exacerbation: Secondary | ICD-10-CM | POA: Diagnosis not present

## 2020-10-30 NOTE — Progress Notes (Signed)
Daily Session Note  Patient Details  Name: [name] [name] MRN: [id number] Date of Birth: 07-16-1940 Referring Provider:   [place] from 08/08/2020 in [place]  Referring Provider Dr. Ander Slade       Encounter Date: 10/30/2020  Check In:  Session Check In - 10/30/20 1330       Check-In   Supervising physician immediately available to respond to emergencies [facility] MD immediately available    Physician(s) Dr. Harrington Challenger    Location [facility]    Staff Present Aundra Dubin, RN, Bjorn Loser, MS, ACSM-CEP, Exercise Physiologist;Phyllis Billingsley, RN    Virtual Visit No    Medication changes reported     No    Fall or balance concerns reported    No    Tobacco Cessation No Change    Warm-up and Cool-down Performed as group-led instruction    Resistance Training Performed Yes    VAD Patient? No    PAD/SET Patient? No      Pain Assessment   Currently in Pain? No/denies    Pain Score 0-No pain    Multiple Pain Sites No             Capillary Blood Glucose: No results found for this or any previous visit (from the past 24 hour(s)).    Social History   Tobacco Use  Smoking Status Former   Packs/day: 1.00   Years: 35.00   Pack years: 35.00   Types: Cigarettes   Quit date: 03/28/2003   Years since quitting: 17.6  Smokeless Tobacco Never    Goals Met:  Proper associated with RPD/PD & O2 Sat Independence with exercise equipment Using PLB without cueing & demonstrates good technique Exercise tolerated well No report of cardiac concerns or symptoms Strength training completed today  Goals Unmet:  Not Applicable  Comments:Check out 1430   Dr. Kathie Dike is Medical Director for [facility] Pulmonary Rehab.

## 2020-10-30 NOTE — Progress Notes (Signed)
Patient ID: [name] [name], male   DOB: [birth year], 80 y.o.   MRN: [id number]      80 y.o. history of AAA, CAD pancreatitis PCI RCA 1000 with low risk myovue 2018. Clinically stable Intolerant to beta blockers with diarrhea . AAA 4.6 cm by [place] 05/12/20 has abdominal CT upcoming Previous palpitations with monitor showing only PAC;s  He has had issues with coreg, atenolol ,cardizem and reanexa as well.   Wife passed a few  years ago of Pancreatic Cancer and only lasted 6 weeks form diagnosis Has daughter in [place]    02/11/20 had recurrent right posterior epistaxis requiring endoscopic control and cauterization   Had right hernia surgery with Dr Arnoldo Morale 03/28/20  Still with some pain/swelling  Had idiopathic pancreatitis recently Not a drinker has had GB removed Seen by Dr Paulita Fujita no etiology noted  No angina or cardiac complaints Former smoker quit in 2014 Doing cardiopulmonary rehab 2x/ week Has seen Dr Melvyn Novas in past CXR 05/26/20 NAD With Emphysema   He has a new primary that he feels is not very thorough  Dr Rosemary Holms retired    ROS: Denies fever, malais, weight loss, blurry vision, decreased visual acuity, cough, sputum, SOB, hemoptysis, pleuritic pain, palpitaitons, heartburn, abdominal pain, melena, lower extremity edema, claudication, or rash.  All other systems reviewed and negative  General: BP 104/64   Pulse 72   Ht 6\' 1"  (1.854 m)   Wt 91.2 kg   SpO2 97%   BMI 26.52 kg/m   Affect appropriate Healthy:  appears stated age 80: hard of hearing Aides in place  Neck supple with no adenopathy JVP normal no bruits no thyromegaly Lungs Mild exp wheezing and good diaphragmatic motion Heart:  S1/S2 no murmur, no rub, gallop or click PMI normal Abdomen: benighn, BS positve, no tenderness, AAA palpable not tender  no bruit.  No HSM or HJR Post right hernia surgery repair but persistent  Umbilical hernia  Distal pulses intact with no bruits No edema Neuro non-focal Skin  warm and dry No muscular weakness   Current Outpatient Medications  Medication Sig Dispense Refill   albuterol (PROVENTIL) (2.5 MG/3ML) 0.083% nebulizer solution Take 3 mLs (2.5 mg total) by nebulization every 6 (six) hours as needed for wheezing or shortness of breath. 100 mL 2   albuterol (VENTOLIN HFA) 108 (90 Base) MCG/ACT inhaler 2 puffs as needed     alfuzosin (UROXATRAL) 10 MG 24 hr tablet Take 1 tablet (10 mg total) by mouth daily with breakfast. 90 tablet 3   cetirizine (ZYRTEC) 10 MG tablet Take 10 mg by mouth daily.     COMBIGAN 0.2-0.5 % ophthalmic solution Apply 1 drop to eye 2 (two) times daily.     esomeprazole (NEXIUM) 40 MG capsule Take 40 mg by mouth daily.     fluticasone (FLOVENT HFA) 220 MCG/ACT inhaler Inhale 2 puffs into the lungs daily. (Patient taking differently: Inhale 2 puffs into the lungs in the morning and at bedtime.) 12 g 5   hydroxypropyl methylcellulose / hypromellose (ISOPTO TEARS / GONIOVISC) 2.5 % ophthalmic solution Place 1 drop into both eyes 3 (three) times daily as needed for dry eyes.     LORazepam (ATIVAN) 0.5 MG tablet Take 1 tablet (0.5 mg total) by mouth at bedtime. 30 tablet 3   losartan (COZAAR) 25 MG tablet Take 1 tablet (25 mg total) by mouth daily. 90 tablet 1   lovastatin (MEVACOR) 40 MG tablet Take 1 tablet (40 mg total) by  mouth daily. 90 tablet 1   nitroGLYCERIN (NITROSTAT) 0.4 MG SL tablet Place 1 tablet (0.4 mg total) under the tongue every 5 (five) minutes as needed for chest pain. 25 tablet 5   pseudoephedrine-guaifenesin (MUCINEX D) 60-600 MG 12 hr tablet Take 1 tablet by mouth 2 (two) times daily as needed for congestion.     sodium chloride (OCEAN) 0.65 % SOLN nasal spray Place 1 spray into both nostrils as needed for congestion.     No current facility-administered medications for this visit.    Allergies  Beta adrenergic blockers, Lasix [furosemide], Cefzil [cefprozil], Dexamethasone, Gabapentin, Neomycin, Tetracyclines &  related, Ciprofloxacin, Methocarbamol, and Penicillins  Electrocardiogram:  04/07/18  SR PAC;s otherwise normal   Assessment and Plan  CAD:  Distant PCI RCA 1999. Low risk myovue April 2018 Clinically stable Intolerant to beta blockers continue medical Rx  AAA: 4.6 cm by [place] 05/12/20 Has f/u CT abdomen scheduled   HTN:  Well controlled.  Continue current medications and low sodium Dash type diet.    GERD:  Continue carafate and nexium f/u GI  Asthma:  On inhalers no active wheezing   PAC/SVT:  Stable unable to take beta blockers  Anxiety/Depression:   Has not started on SSRI   ENT:  ARB held hearing worse using Aides   HLD:  On mevacor 40 mg daily LDL 67 at goal   Hernia:  F/u Arnoldo Morale seems healed but still with pain/swelling    F/U with me 6 months   CT abdomen for AAA  BMET   [employer]

## 2020-11-04 ENCOUNTER — Encounter (HOSPITAL_COMMUNITY)
Admission: RE | Admit: 2020-11-04 | Discharge: 2020-11-04 | Disposition: A | Discharge status: Home / Self Care | Payer: Medicare Other | Source: Ambulatory Visit | Attending: Pulmonary Disease | Admitting: Pulmonary Disease

## 2020-11-04 ENCOUNTER — Other Ambulatory Visit: Payer: Self-pay

## 2020-11-04 VITALS — Wt 200.4 lb

## 2020-11-04 DIAGNOSIS — J441 Chronic obstructive pulmonary disease with (acute) exacerbation: Secondary | ICD-10-CM | POA: Diagnosis not present

## 2020-11-04 NOTE — Progress Notes (Signed)
Daily Session Note  Patient Details  Name: [name] [name] MRN: [id number] Date of Birth: 1940-11-16 Referring Provider:   [place] from 08/08/2020 in [place]  Referring Provider Dr. Ander Slade       Encounter Date: 11/04/2020  Check In:  Session Check In - 11/04/20 1330       Check-In   Supervising physician immediately available to respond to emergencies [facility] MD immediately available    Physician(s) Dr. Harl Bowie    Location [facility]    Staff Present Geanie Cooley, RN;Dalton Kris Mouton, MS, ACSM-CEP, Exercise Physiologist    Virtual Visit No    Medication changes reported     No    Fall or balance concerns reported    No    Tobacco Cessation No Change    Warm-up and Cool-down Performed as group-led instruction    Resistance Training Performed Yes    VAD Patient? No    PAD/SET Patient? No      Pain Assessment   Currently in Pain? No/denies    Pain Score 0-No pain    Multiple Pain Sites No             Capillary Blood Glucose: No results found for this or any previous visit (from the past 24 hour(s)).    Social History   Tobacco Use  Smoking Status Former   Packs/day: 1.00   Years: 35.00   Pack years: 35.00   Types: Cigarettes   Quit date: 03/28/2003   Years since quitting: 17.6  Smokeless Tobacco Never    Goals Met:  Proper associated with RPD/PD & O2 Sat Independence with exercise equipment Using PLB without cueing & demonstrates good technique Exercise tolerated well No report of cardiac concerns or symptoms Strength training completed today  Goals Unmet:  Not Applicable  Comments: check out @ 2:30pm   Dr. Kathie Dike is Medical Director for [facility] Pulmonary Rehab.

## 2020-11-05 ENCOUNTER — Other Ambulatory Visit (HOSPITAL_COMMUNITY)
Admission: RE | Admit: 2020-11-05 | Discharge: 2020-11-05 | Disposition: A | Discharge status: Home / Self Care | Payer: Medicare Other | Source: Ambulatory Visit | Attending: Cardiovascular Disease | Admitting: Cardiovascular Disease

## 2020-11-05 ENCOUNTER — Ambulatory Visit (INDEPENDENT_AMBULATORY_CARE_PROVIDER_SITE_OTHER): Payer: Medicare Other | Admitting: Cardiovascular Disease

## 2020-11-05 ENCOUNTER — Encounter: Payer: Self-pay | Admitting: Cardiovascular Disease

## 2020-11-05 VITALS — BP 104/64 | HR 72 | Ht 73.0 in | Wt 201.0 lb

## 2020-11-05 DIAGNOSIS — I714 Abdominal aortic aneurysm, without rupture, unspecified: Secondary | ICD-10-CM

## 2020-11-05 DIAGNOSIS — E782 Mixed hyperlipidemia: Secondary | ICD-10-CM | POA: Diagnosis not present

## 2020-11-05 DIAGNOSIS — I471 Supraventricular tachycardia: Secondary | ICD-10-CM | POA: Diagnosis not present

## 2020-11-05 DIAGNOSIS — I10 Essential (primary) hypertension: Secondary | ICD-10-CM | POA: Diagnosis not present

## 2020-11-05 DIAGNOSIS — I2510 Atherosclerotic heart disease of native coronary artery without angina pectoris: Secondary | ICD-10-CM | POA: Diagnosis not present

## 2020-11-05 LAB — BASIC METABOLIC PANEL
Anion gap: 6 (ref 5–15)
BUN: 13 mg/dL (ref 8–23)
CO2: 28 mmol/L (ref 22–32)
Calcium: 8.9 mg/dL (ref 8.9–10.3)
Chloride: 101 mmol/L (ref 98–111)
Creatinine, Ser: 0.82 mg/dL (ref 0.61–1.24)
GFR, Estimated: >60 mL/min (ref >60)
Glucose, Bld: 107 mg/dL — ABNORMAL HIGH (ref 70–99)
Potassium: 4.4 mmol/L (ref 3.5–5.1)
Sodium: 135 mmol/L (ref 135–145)

## 2020-11-05 NOTE — Progress Notes (Signed)
Pulmonary Individual Treatment Plan  Patient Details  Name: [name] [name] MRN: [id number] Date of Birth: 1940-06-25 Referring Provider:   Flowsheet Row PULMONARY REHAB OTHER RESP ORIENTATION from 08/08/2020 in [place]  Referring Provider Dr. Ander Slade       Initial Encounter Date:  Flowsheet Row PULMONARY REHAB OTHER RESP ORIENTATION from 08/08/2020 in [facility]  Date 08/08/20       Visit Diagnosis: COPD with acute exacerbation ([place])  Patient's Home Medications on Admission:   Current Outpatient Medications:    albuterol (PROVENTIL) (2.5 MG/3ML) 0.083% nebulizer solution, Take 3 mLs (2.5 mg total) by nebulization every 6 (six) hours as needed for wheezing or shortness of breath., Disp: 100 mL, Rfl: 2   albuterol (VENTOLIN HFA) 108 (90 Base) MCG/ACT inhaler, 2 puffs as needed, Disp: , Rfl:    alfuzosin (UROXATRAL) 10 MG 24 hr tablet, Take 1 tablet (10 mg total) by mouth daily with breakfast., Disp: 90 tablet, Rfl: 3   cetirizine (ZYRTEC) 10 MG tablet, Take 10 mg by mouth daily., Disp: , Rfl:    COMBIGAN 0.2-0.5 % ophthalmic solution, Apply 1 drop to eye 2 (two) times daily., Disp: , Rfl:    esomeprazole (NEXIUM) 40 MG capsule, Take 40 mg by mouth daily., Disp: , Rfl:    fluticasone (FLOVENT HFA) 220 MCG/ACT inhaler, Inhale 2 puffs into the lungs daily. (Patient taking differently: Inhale 2 puffs into the lungs in the morning and at bedtime.), Disp: 12 g, Rfl: 5   hydroxypropyl methylcellulose / hypromellose (ISOPTO TEARS / GONIOVISC) 2.5 % ophthalmic solution, Place 1 drop into both eyes 3 (three) times daily as needed for dry eyes., Disp: , Rfl:    LORazepam (ATIVAN) 0.5 MG tablet, Take 1 tablet (0.5 mg total) by mouth at bedtime., Disp: 30 tablet, Rfl: 3   losartan (COZAAR) 25 MG tablet, Take 1 tablet (25 mg total) by mouth daily., Disp: 90 tablet, Rfl: 1   lovastatin (MEVACOR) 40 MG tablet, Take 1 tablet (40 mg total) by mouth daily.,  Disp: 90 tablet, Rfl: 1   nitroGLYCERIN (NITROSTAT) 0.4 MG SL tablet, Place 1 tablet (0.4 mg total) under the tongue every 5 (five) minutes as needed for chest pain., Disp: 25 tablet, Rfl: 5   pseudoephedrine-guaifenesin (MUCINEX D) 60-600 MG 12 hr tablet, Take 1 tablet by mouth 2 (two) times daily as needed for congestion., Disp: , Rfl:    sodium chloride (OCEAN) 0.65 % SOLN nasal spray, Place 1 spray into both nostrils as needed for congestion., Disp: , Rfl:   Past Medical History: Past Medical History:  Diagnosis Date   AAA (abdominal aortic aneurysm) ([place])    needs yearly ultrasound   Allergy    Anemia    Arthritis    Asthma    BCC (basal cell carcinoma) 08/18/1989   left shoulder blad, upper right arm, left arm beyond elbow, c&d   BCC (basal cell carcinoma) 01/31/1992   Posterior neck, curetx3, 6fu   BCC (basal cell carcinoma) 11/22/2001   mid forehead, cx3, excision, right forearm, cx3, 9fu   BCC (basal cell carcinoma) 10/09/2003   mid forehead, MOHs   BCC (basal cell carcinoma) 08/15/2008   upper left back, biopsy   BPH (benign prostatic hyperplasia)    CAD (coronary artery disease)    Cancer (HCC)    skin cancer   COPD (chronic obstructive pulmonary disease) ([place])    Dysrhythmia    pt. states it can be fast at times  GERD (gastroesophageal reflux disease)    Glaucoma    HOH (hard of hearing)    Hypercholesterolemia    Hypertension    Impaired fasting glucose    Low back pain    Melanoma in situ ([place]) 10/09/2003   left chin, MOHs   MI (myocardial infarction) ([place]) 1999   SCC (squamous cell carcinoma) 07/03/2014   in situ, behind left ear, cx3, cautery, 73fu   SCC (squamous cell carcinoma) 07/03/2014   well diff, left forearm, biopsy, cx1, cautery   SCC (squamous cell carcinoma) 07/20/2017   in situ, left upper arm, cx3, 50fu   SCC (squamous cell carcinoma) 01/10/2019   in situ, left post shoulder, cx3, 5fu   SCC (squamous cell carcinoma) 11/22/2001   left  forearm distal, left forearm, cx3, 24fu   SCC (squamous cell carcinoma) 10/09/2003   [place], left ear post, clear per st, right cheek clear   SCC (squamous cell carcinoma) 03/30/2004   in situ, left upper arm, cx3, 57fu   SCC (squamous cell carcinoma) 03/08/2005   in situ, right cheek, mid upper forehead, cx3, 48fu   SCC (squamous cell carcinoma) 06/08/2006   in situ, left shoulder, cx3, 48fu   SCC (squamous cell carcinoma) 05/05/2010   right inner wrist, biopsy   SCC (squamous cell carcinoma) 09/13/2013   in situ, right crown scalp, front scalp, biopsy   Thr[place]     Tobacco Use: Social History   Tobacco Use  Smoking Status Former   Packs/day: 1.00   Years: 35.00   Pack years: 35.00   Types: Cigarettes   Quit date: 03/28/2003   Years since quitting: 17.6  Smokeless Tobacco Never    Labs: Recent Review Flowsheet Data     Labs for ITP Cardiac and Pulmonary Rehab Latest Ref Rng & Units 06/23/2017 02/14/2019 09/05/2019 11/12/2019 09/05/2020   Cholestrol 100 - 199 mg/dL 122 117 124 107 129   LDLCALC 0 - 99 mg/dL 67 61 63 61 78   HDL >39 mg/dL 43 38(L) 46 35(L) 37(L)   Trlycerides 0 - 149 mg/dL 61 93 71 54 70       Capillary Blood Glucose: Lab Results  Component Value Date   GLUCAP 97 09/17/2016   GLUCAP 145 (H) 09/16/2016     Pulmonary Assessment Scores:  Pulmonary Assessment Scores     Row Name 08/08/20 1254         ADL UCSD   SOB Score total 22           CAT Score     CAT Score 19           mMRC Score     mMRC Score 2            UCSD: Self-administered rating of dyspnea associated with activities of daily living (ADLs) 6-point scale (0 = "not at all" to 5 = "maximal or unable to do because of breathlessness")  Scoring Scores range from 0 to 120.  Minimally important difference is 5 units  CAT: CAT can identify the health impairment of COPD patients and is better correlated with disease progression.  CAT has a scoring range of zero to 40. The CAT score  is classified into four groups of low (less than 10), medium (10 - 20), high (21-30) and very high (31-40) based on the impact level of disease on health status. A CAT score over 10 suggests significant symptoms.  A worsening CAT score could be explained by an exacerbation, poor medication adherence,  poor inhaler technique, or progression of COPD or comorbid conditions.  CAT MCID is 2 points  mMRC: mMRC (Modified Medical Research Council) Dyspnea Scale is used to assess the degree of baseline functional disability in patients of respiratory disease due to dyspnea. No minimal important difference is established. A decrease in score of 1 point or greater is considered a positive change.   Pulmonary Function Assessment:   Exercise Target Goals: Exercise Program Goal: Individual exercise prescription set using results from initial 6 min walk test and THRR while considering  patient's activity barriers and safety.   Exercise Prescription Goal: Initial exercise prescription builds to 30-45 minutes a day of aerobic activity, 2-3 days per week.  Home exercise guidelines will be given to patient during program as part of exercise prescription that the participant will acknowledge.  Activity Barriers & Risk Stratification:  Activity Barriers & Cardiac Risk Stratification - 08/08/20 1253       Activity Barriers & Cardiac Risk Stratification   Activity Barriers Arthritis;Back Problems;Neck/Spine Problems;Deconditioning;Muscular Weakness;Shortness of Breath    Cardiac Risk Stratification High             6 Minute Walk:  6 Minute Walk     Row Name 08/08/20 1428         6 Minute Walk   Phase Initial     Distance 1200 feet     Walk Time 6 minutes     # of Rest Breaks 0     MPH 2.27     METS 2.43     RPE 11     Perceived Dyspnea  13     VO2 Peak 8.51     Resting HR 72 bpm     Resting BP 112/74     Resting Oxygen Saturation  96 %     Exercise Oxygen Saturation  during 6 min walk 91 %      Max Ex. HR 99 bpm     Max Ex. BP 128/64     2 Minute Post BP 106/70           Interval HR     1 Minute HR 96     2 Minute HR 99     3 Minute HR 97     4 Minute HR 93     5 Minute HR 96     6 Minute HR 96     2 Minute Post HR 83     Interval Heart Rate? Yes           Interval Oxygen     Interval Oxygen? Yes     Baseline Oxygen Saturation % 96 %     1 Minute Oxygen Saturation % 93 %     1 Minute Liters of Oxygen 0 L     2 Minute Oxygen Saturation % 91 %     2 Minute Liters of Oxygen 0 L     3 Minute Oxygen Saturation % 92 %     3 Minute Liters of Oxygen 0 L     4 Minute Oxygen Saturation % 91 %     4 Minute Liters of Oxygen 0 L     5 Minute Oxygen Saturation % 92 %     5 Minute Liters of Oxygen 0 L     6 Minute Oxygen Saturation % 92 %     6 Minute Liters of Oxygen 0 L     2 Minute Post Oxygen Saturation % 96 %  2 Minute Post Liters of Oxygen 0 L             Oxygen Initial Assessment:  Oxygen Initial Assessment - 08/08/20 1433       Initial 6 min Walk   Oxygen Used None      Program Oxygen Prescription   Program Oxygen Prescription None      Intervention   Short Term Goals To learn and exhibit compliance with exercise, home and travel O2 prescription;To learn and understand importance of monitoring SPO2 with pulse oximeter and demonstrate accurate use of the pulse oximeter.;To learn and understand importance of maintaining oxygen saturations>88%;To learn and demonstrate proper pursed lip breathing techniques or other breathing techniques. ;To learn and demonstrate proper use of respiratory medications    Long  Term Goals Exhibits compliance with exercise, home  and travel O2 prescription;Verbalizes importance of monitoring SPO2 with pulse oximeter and return demonstration;Maintenance of O2 saturations>88%;Exhibits proper breathing techniques, such as pursed lip breathing or other method taught during program session;Compliance with respiratory medication              Oxygen Re-Evaluation:  Oxygen Re-Evaluation     Row Name 08/19/20 1613 09/16/20 1412 10/14/20 1545 11/04/20 1536       Program Oxygen Prescription   Program Oxygen Prescription None None None None         Home Oxygen        Home Oxygen Device None None None None    Sleep Oxygen Prescription None None None None    Home Exercise Oxygen Prescription None None None None    Home Resting Oxygen Prescription None None None None    Compliance with Home Oxygen Use Yes Yes Yes Yes         Goals/Expected Outcomes        Short Term Goals To learn and exhibit compliance with exercise, home and travel O2 prescription;To learn and understand importance of monitoring SPO2 with pulse oximeter and demonstrate accurate use of the pulse oximeter.;To learn and understand importance of maintaining oxygen saturations>88%;To learn and demonstrate proper pursed lip breathing techniques or other breathing techniques. ;To learn and demonstrate proper use of respiratory medications To learn and exhibit compliance with exercise, home and travel O2 prescription;To learn and understand importance of monitoring SPO2 with pulse oximeter and demonstrate accurate use of the pulse oximeter.;To learn and understand importance of maintaining oxygen saturations>88%;To learn and demonstrate proper pursed lip breathing techniques or other breathing techniques. ;To learn and demonstrate proper use of respiratory medications To learn and exhibit compliance with exercise, home and travel O2 prescription;To learn and understand importance of monitoring SPO2 with pulse oximeter and demonstrate accurate use of the pulse oximeter.;To learn and understand importance of maintaining oxygen saturations>88%;To learn and demonstrate proper pursed lip breathing techniques or other breathing techniques. ;To learn and demonstrate proper use of respiratory medications To learn and exhibit compliance with exercise, home and travel O2  prescription;To learn and understand importance of monitoring SPO2 with pulse oximeter and demonstrate accurate use of the pulse oximeter.;To learn and understand importance of maintaining oxygen saturations>88%;To learn and demonstrate proper pursed lip breathing techniques or other breathing techniques. ;To learn and demonstrate proper use of respiratory medications    Long  Term Goals Exhibits compliance with exercise, home  and travel O2 prescription;Verbalizes importance of monitoring SPO2 with pulse oximeter and return demonstration;Maintenance of O2 saturations>88%;Exhibits proper breathing techniques, such as pursed lip breathing or other method taught during program session;Compliance with respiratory medication  Exhibits compliance with exercise, home  and travel O2 prescription;Verbalizes importance of monitoring SPO2 with pulse oximeter and return demonstration;Maintenance of O2 saturations>88%;Exhibits proper breathing techniques, such as pursed lip breathing or other method taught during program session;Compliance with respiratory medication Exhibits compliance with exercise, home  and travel O2 prescription;Verbalizes importance of monitoring SPO2 with pulse oximeter and return demonstration;Maintenance of O2 saturations>88%;Exhibits proper breathing techniques, such as pursed lip breathing or other method taught during program session;Compliance with respiratory medication Exhibits compliance with exercise, home  and travel O2 prescription;Verbalizes importance of monitoring SPO2 with pulse oximeter and return demonstration;Maintenance of O2 saturations>88%;Exhibits proper breathing techniques, such as pursed lip breathing or other method taught during program session;Compliance with respiratory medication    Goals/Expected Outcomes compliance compliance -- compliance            Oxygen Discharge (Final Oxygen Re-Evaluation):  Oxygen Re-Evaluation - 11/04/20 1536       Program Oxygen  Prescription   Program Oxygen Prescription None      Home Oxygen   Home Oxygen Device None    Sleep Oxygen Prescription None    Home Exercise Oxygen Prescription None    Home Resting Oxygen Prescription None    Compliance with Home Oxygen Use Yes      Goals/Expected Outcomes   Short Term Goals To learn and exhibit compliance with exercise, home and travel O2 prescription;To learn and understand importance of monitoring SPO2 with pulse oximeter and demonstrate accurate use of the pulse oximeter.;To learn and understand importance of maintaining oxygen saturations>88%;To learn and demonstrate proper pursed lip breathing techniques or other breathing techniques. ;To learn and demonstrate proper use of respiratory medications    Long  Term Goals Exhibits compliance with exercise, home  and travel O2 prescription;Verbalizes importance of monitoring SPO2 with pulse oximeter and return demonstration;Maintenance of O2 saturations>88%;Exhibits proper breathing techniques, such as pursed lip breathing or other method taught during program session;Compliance with respiratory medication    Goals/Expected Outcomes compliance             Initial Exercise Prescription:  Initial Exercise Prescription - 08/08/20 1400       Date of Initial Exercise RX and Referring Provider   Date 08/08/20    Referring Provider Dr. Ander Slade    Expected Discharge Date 12/11/20      NuStep   Level 1    SPM 80    Minutes 22      Arm Ergometer   Level 1    RPM 45    Minutes 17      Prescription Details   Frequency (times per week) 2    Duration Progress to 30 minutes of continuous aerobic without signs/symptoms of physical distress      Intensity   THRR 40-80% of Max Heartrate 57-114    Ratings of Perceived Exertion 11-13    Perceived Dyspnea 0-4      Resistance Training   Training Prescription Yes    Weight 3 lbs    Reps 10-15             Perform Capillary Blood Glucose checks as  needed.  Exercise Prescription Changes:   Exercise Prescription Changes     Row Name 08/19/20 1600 09/02/20 1400 09/16/20 1400 09/25/20 1400 10/14/20 1500     Response to Exercise   Blood Pressure (Admit) 110/68 112/60 112/68 112/68 108/60   Blood Pressure (Exercise) 122/70 124/64 118/70 128/70 128/76   Blood Pressure (Exit) 104/70 100/52 108/70 104/58 96/56  Heart Rate (Admit) 92 bpm 81 bpm 76 bpm 75 bpm 84 bpm   Heart Rate (Exercise) 93 bpm 95 bpm 87 bpm 87 bpm 92 bpm   Heart Rate (Exit) 87 bpm 85 bpm 78 bpm 82 bpm 81 bpm   Oxygen Saturation (Admit) 95 % 196 % 95 % 96 % 95 %   Oxygen Saturation (Exercise) 93 % 95 % 94 % 96 % 96 %   Oxygen Saturation (Exit) 95 % 95 % 97 % 96 % 95 %   Rating of Perceived Exertion (Exercise) 14 12 11 10 11    Perceived Dyspnea (Exercise) 12 12 11 10 11    Duration Continue with 30 min of aerobic exercise without signs/symptoms of physical distress. Continue with 30 min of aerobic exercise without signs/symptoms of physical distress. Continue with 30 min of aerobic exercise without signs/symptoms of physical distress. Continue with 30 min of aerobic exercise without signs/symptoms of physical distress. Continue with 30 min of aerobic exercise without signs/symptoms of physical distress.   Intensity THRR unchanged THRR unchanged THRR unchanged THRR unchanged THRR unchanged     Progression   Progression Continue to progress workloads to maintain intensity without signs/symptoms of physical distress. Continue to progress workloads to maintain intensity without signs/symptoms of physical distress. Continue to progress workloads to maintain intensity without signs/symptoms of physical distress. Continue to progress workloads to maintain intensity without signs/symptoms of physical distress. Continue to progress workloads to maintain intensity without signs/symptoms of physical distress.     Resistance Training   Training Prescription Yes Yes Yes Yes Yes   Weight  3 lbs 4 lbs 4 lbs 4 lbs 4 lbs   Reps 10-15 10-15 10-15 10-15 10-15   Time -- 10 Minutes 10 Minutes 10 Minutes 10 Minutes     NuStep   Level 1 1 2 2 2    SPM 84 89 84 94 90   Minutes 22 22 22 22 22    METs 2 2.1 2 2.1 2.1     Arm Ergometer   Level 1 1.5 1.5 1.9 1.5   RPM 53 52 57 24 59   Minutes 17 17 17 17 17    METs 1.8 1.8 2.1 2 2.1    Row Name 10/21/20 1600 11/04/20 1500           Response to Exercise   Blood Pressure (Admit) -- 98/62      Blood Pressure (Exercise) -- 114/76      Blood Pressure (Exit) -- 110/70      Heart Rate (Admit) -- 72 bpm      Heart Rate (Exercise) -- 82 bpm      Heart Rate (Exit) -- 79 bpm      Oxygen Saturation (Admit) -- 96 %      Oxygen Saturation (Exercise) -- 95 %      Oxygen Saturation (Exit) -- 96 %      Rating of Perceived Exertion (Exercise) -- 11      Perceived Dyspnea (Exercise) -- 11      Duration -- Continue with 30 min of aerobic exercise without signs/symptoms of physical distress.      Intensity -- THRR unchanged             Progression      Progression -- Continue to progress workloads to maintain intensity without signs/symptoms of physical distress.             Resistance Training      Training Prescription -- Yes  Weight -- 4 lbs      Reps -- 10-15      Time -- 10 Minutes             NuStep      Level -- 2      SPM -- 85      Minutes -- 22      METs -- 2.1             Arm Ergometer      Level -- 2      RPM -- 57      Minutes -- 17      METs -- 2.4             Home Exercise Plan      Plans to continue exercise at Home (comment) --      Frequency Add 2 additional days to program exercise sessions. --      Initial Home Exercises Provided 10/21/20 --              Exercise Comments:   Exercise Comments     Row Name 10/21/20 1603           Exercise Comments Patient and I reviewed home exercise program. He is not currenlty exercising at home. He wants to get back into exercising and says he has  "falling off" the last couple of years with his health. He and I agreed on starting at 20 minutes 2 extra days per week. He is also going to engage in resistance training. Will follow up and progress as able.                Exercise Goals and Review:   Exercise Goals     Row Name 08/08/20 1432 08/19/20 1616 09/16/20 1412 10/14/20 1542 11/04/20 1539     Exercise Goals   Increase Physical Activity Yes Yes Yes Yes Yes   Intervention Provide advice, education, support and counseling about physical activity/exercise needs.;Develop an individualized exercise prescription for aerobic and resistive training based on initial evaluation findings, risk stratification, comorbidities and participant's personal goals. Provide advice, education, support and counseling about physical activity/exercise needs.;Develop an individualized exercise prescription for aerobic and resistive training based on initial evaluation findings, risk stratification, comorbidities and participant's personal goals. Provide advice, education, support and counseling about physical activity/exercise needs.;Develop an individualized exercise prescription for aerobic and resistive training based on initial evaluation findings, risk stratification, comorbidities and participant's personal goals. Provide advice, education, support and counseling about physical activity/exercise needs.;Develop an individualized exercise prescription for aerobic and resistive training based on initial evaluation findings, risk stratification, comorbidities and participant's personal goals. Provide advice, education, support and counseling about physical activity/exercise needs.;Develop an individualized exercise prescription for aerobic and resistive training based on initial evaluation findings, risk stratification, comorbidities and participant's personal goals.   Expected Outcomes Short Term: Attend rehab on a regular basis to increase amount of physical  activity.;Long Term: Add in home exercise to make exercise part of routine and to increase amount of physical activity.;Long Term: Exercising regularly at least 3-5 days a week. Short Term: Attend rehab on a regular basis to increase amount of physical activity.;Long Term: Add in home exercise to make exercise part of routine and to increase amount of physical activity.;Long Term: Exercising regularly at least 3-5 days a week. Short Term: Attend rehab on a regular basis to increase amount of physical activity.;Long Term: Add in home exercise to make exercise part of routine and to increase amount  of physical activity.;Long Term: Exercising regularly at least 3-5 days a week. Short Term: Attend rehab on a regular basis to increase amount of physical activity.;Long Term: Add in home exercise to make exercise part of routine and to increase amount of physical activity.;Long Term: Exercising regularly at least 3-5 days a week. Short Term: Attend rehab on a regular basis to increase amount of physical activity.;Long Term: Add in home exercise to make exercise part of routine and to increase amount of physical activity.;Long Term: Exercising regularly at least 3-5 days a week.   Increase Strength and Stamina Yes Yes Yes Yes Yes   Intervention Provide advice, education, support and counseling about physical activity/exercise needs.;Develop an individualized exercise prescription for aerobic and resistive training based on initial evaluation findings, risk stratification, comorbidities and participant's personal goals. Provide advice, education, support and counseling about physical activity/exercise needs.;Develop an individualized exercise prescription for aerobic and resistive training based on initial evaluation findings, risk stratification, comorbidities and participant's personal goals. Provide advice, education, support and counseling about physical activity/exercise needs.;Develop an individualized exercise  prescription for aerobic and resistive training based on initial evaluation findings, risk stratification, comorbidities and participant's personal goals. Provide advice, education, support and counseling about physical activity/exercise needs.;Develop an individualized exercise prescription for aerobic and resistive training based on initial evaluation findings, risk stratification, comorbidities and participant's personal goals. Provide advice, education, support and counseling about physical activity/exercise needs.;Develop an individualized exercise prescription for aerobic and resistive training based on initial evaluation findings, risk stratification, comorbidities and participant's personal goals.   Expected Outcomes Short Term: Increase workloads from initial exercise prescription for resistance, speed, and METs.;Short Term: Perform resistance training exercises routinely during rehab and add in resistance training at home;Long Term: Improve cardiorespiratory fitness, muscular endurance and strength as measured by increased METs and functional capacity (6MWT) Short Term: Increase workloads from initial exercise prescription for resistance, speed, and METs.;Short Term: Perform resistance training exercises routinely during rehab and add in resistance training at home;Long Term: Improve cardiorespiratory fitness, muscular endurance and strength as measured by increased METs and functional capacity (6MWT) Short Term: Increase workloads from initial exercise prescription for resistance, speed, and METs.;Short Term: Perform resistance training exercises routinely during rehab and add in resistance training at home;Long Term: Improve cardiorespiratory fitness, muscular endurance and strength as measured by increased METs and functional capacity (6MWT) Short Term: Increase workloads from initial exercise prescription for resistance, speed, and METs.;Short Term: Perform resistance training exercises routinely  during rehab and add in resistance training at home;Long Term: Improve cardiorespiratory fitness, muscular endurance and strength as measured by increased METs and functional capacity (6MWT) Short Term: Increase workloads from initial exercise prescription for resistance, speed, and METs.;Short Term: Perform resistance training exercises routinely during rehab and add in resistance training at home;Long Term: Improve cardiorespiratory fitness, muscular endurance and strength as measured by increased METs and functional capacity (6MWT)   Able to understand and use rate of perceived exertion (RPE) scale Yes Yes Yes Yes Yes   Intervention Provide education and explanation on how to use RPE scale Provide education and explanation on how to use RPE scale Provide education and explanation on how to use RPE scale Provide education and explanation on how to use RPE scale Provide education and explanation on how to use RPE scale   Expected Outcomes Short Term: Able to use RPE daily in rehab to express subjective intensity level;Long Term:  Able to use RPE to guide intensity level when exercising independently Short Term: Able to  use RPE daily in rehab to express subjective intensity level;Long Term:  Able to use RPE to guide intensity level when exercising independently Short Term: Able to use RPE daily in rehab to express subjective intensity level;Long Term:  Able to use RPE to guide intensity level when exercising independently Short Term: Able to use RPE daily in rehab to express subjective intensity level;Long Term:  Able to use RPE to guide intensity level when exercising independently Short Term: Able to use RPE daily in rehab to express subjective intensity level;Long Term:  Able to use RPE to guide intensity level when exercising independently   Able to understand and use Dyspnea scale Yes Yes Yes Yes Yes   Intervention Provide education and explanation on how to use Dyspnea scale Provide education and  explanation on how to use Dyspnea scale Provide education and explanation on how to use Dyspnea scale Provide education and explanation on how to use Dyspnea scale Provide education and explanation on how to use Dyspnea scale   Expected Outcomes Short Term: Able to use Dyspnea scale daily in rehab to express subjective sense of shortness of breath during exertion;Long Term: Able to use Dyspnea scale to guide intensity level when exercising independently Short Term: Able to use Dyspnea scale daily in rehab to express subjective sense of shortness of breath during exertion;Long Term: Able to use Dyspnea scale to guide intensity level when exercising independently Short Term: Able to use Dyspnea scale daily in rehab to express subjective sense of shortness of breath during exertion;Long Term: Able to use Dyspnea scale to guide intensity level when exercising independently Short Term: Able to use Dyspnea scale daily in rehab to express subjective sense of shortness of breath during exertion;Long Term: Able to use Dyspnea scale to guide intensity level when exercising independently Short Term: Able to use Dyspnea scale daily in rehab to express subjective sense of shortness of breath during exertion;Long Term: Able to use Dyspnea scale to guide intensity level when exercising independently   Knowledge and understanding of Target Heart Rate Range (THRR) Yes Yes Yes Yes Yes   Intervention Provide education and explanation of THRR including how the numbers were predicted and where they are located for reference Provide education and explanation of THRR including how the numbers were predicted and where they are located for reference Provide education and explanation of THRR including how the numbers were predicted and where they are located for reference Provide education and explanation of THRR including how the numbers were predicted and where they are located for reference Provide education and explanation of THRR  including how the numbers were predicted and where they are located for reference   Expected Outcomes Short Term: Able to state/look up THRR;Long Term: Able to use THRR to govern intensity when exercising independently;Short Term: Able to use daily as guideline for intensity in rehab Short Term: Able to state/look up THRR;Long Term: Able to use THRR to govern intensity when exercising independently;Short Term: Able to use daily as guideline for intensity in rehab Short Term: Able to state/look up THRR;Long Term: Able to use THRR to govern intensity when exercising independently;Short Term: Able to use daily as guideline for intensity in rehab Short Term: Able to state/look up THRR;Long Term: Able to use THRR to govern intensity when exercising independently;Short Term: Able to use daily as guideline for intensity in rehab Short Term: Able to state/look up THRR;Long Term: Able to use THRR to govern intensity when exercising independently;Short Term: Able to use daily as  guideline for intensity in rehab   Understanding of Exercise Prescription Yes Yes Yes Yes Yes   Intervention Provide education, explanation, and written materials on patient's individual exercise prescription Provide education, explanation, and written materials on patient's individual exercise prescription Provide education, explanation, and written materials on patient's individual exercise prescription Provide education, explanation, and written materials on patient's individual exercise prescription Provide education, explanation, and written materials on patient's individual exercise prescription   Expected Outcomes Short Term: Able to explain program exercise prescription;Long Term: Able to explain home exercise prescription to exercise independently Short Term: Able to explain program exercise prescription;Long Term: Able to explain home exercise prescription to exercise independently Short Term: Able to explain program exercise  prescription;Long Term: Able to explain home exercise prescription to exercise independently Short Term: Able to explain program exercise prescription;Long Term: Able to explain home exercise prescription to exercise independently Short Term: Able to explain program exercise prescription;Long Term: Able to explain home exercise prescription to exercise independently            Exercise Goals Re-Evaluation :  Exercise Goals Re-Evaluation     Row Name 08/19/20 1616 09/16/20 1419 10/14/20 1542 11/04/20 1539       Exercise Goal Re-Evaluation   Exercise Goals Review Increase Physical Activity;Increase Strength and Stamina;Able to understand and use rate of perceived exertion (RPE) scale;Able to understand and use Dyspnea scale;Knowledge and understanding of Target Heart Rate Range (THRR);Understanding of Exercise Prescription Increase Physical Activity;Increase Strength and Stamina;Able to understand and use rate of perceived exertion (RPE) scale;Able to understand and use Dyspnea scale;Knowledge and understanding of Target Heart Rate Range (THRR);Understanding of Exercise Prescription Increase Physical Activity;Increase Strength and Stamina;Able to understand and use rate of perceived exertion (RPE) scale;Able to understand and use Dyspnea scale;Knowledge and understanding of Target Heart Rate Range (THRR);Understanding of Exercise Prescription Increase Physical Activity;Increase Strength and Stamina;Able to understand and use rate of perceived exertion (RPE) scale;Able to understand and use Dyspnea scale;Knowledge and understanding of Target Heart Rate Range (THRR);Understanding of Exercise Prescription    Comments Pt has attended 3 rehab sessions. He has been able to tolerate exercise well and should progress well through the program. He currently exercises at 2.0 METs on the stepper. Will continue to monitor and progress as able. Patient has completed 11 exercise sessions. He has tolerated exercise  well with no complaints. He is making progress in the program and is enjoying the program. He is very interested in tracking his progress and learning how we increase his intensities. He is very positive to have in class and is very interactive with the staff and others in his class. He is currently exercising at 2.0 METs on the NuStep. Will continue to monitor and progress as able. patient has completed 15 exercise sessions. He has tolerated exercise well and is progressing slowly. He is very eager to progress and eager to learn about hisprogress. He asks questions frequently. He is very positive in class and is very interactive with the staff and others in his class. he is currently exercising at 2.1 METs on the Nustep. Will continue to monitor and progress as able. Pt has completed 21 exercise sessions. He continues to progress and has began to increase his workloads. He reports that he can tell that he is getting stronger. He is currently exercising at 2.1 METs on the stepper. Will continue to monitor and progress as able.    Expected Outcomes Through exercise at rehab and through engaging in a home exercise program,  the patient will meet their stated goals. Through exercise at rehab and through engaging in a home exercise program, the patient will meet their stated goals. Through exercise at rehab and through engaging in a home exercise program, the patient will meet their stated goals. Through exercise at rehab and through engaging in a home exercise program, the patient will meet their stated goals.             Discharge Exercise Prescription (Final Exercise Prescription Changes):  Exercise Prescription Changes - 11/04/20 1500       Response to Exercise   Blood Pressure (Admit) 98/62    Blood Pressure (Exercise) 114/76    Blood Pressure (Exit) 110/70    Heart Rate (Admit) 72 bpm    Heart Rate (Exercise) 82 bpm    Heart Rate (Exit) 79 bpm    Oxygen Saturation (Admit) 96 %    Oxygen  Saturation (Exercise) 95 %    Oxygen Saturation (Exit) 96 %    Rating of Perceived Exertion (Exercise) 11    Perceived Dyspnea (Exercise) 11    Duration Continue with 30 min of aerobic exercise without signs/symptoms of physical distress.    Intensity THRR unchanged      Progression   Progression Continue to progress workloads to maintain intensity without signs/symptoms of physical distress.      Resistance Training   Training Prescription Yes    Weight 4 lbs    Reps 10-15    Time 10 Minutes      NuStep   Level 2    SPM 85    Minutes 22    METs 2.1      Arm Ergometer   Level 2    RPM 57    Minutes 17    METs 2.4             Nutrition:  Target Goals: Understanding of nutrition guidelines, daily intake of sodium 1500mg , cholesterol 200mg , calories 30% from fat and 7% or less from saturated fats, daily to have 5 or more servings of fruits and vegetables.  Biometrics:  Pre Biometrics - 10/14/20 1545       Pre Biometrics   Weight 90.6 kg    BMI (Calculated) 26.36              Nutrition Therapy Plan and Nutrition Goals:  Nutrition Therapy & Goals - 10/28/20 1528       Personal Nutrition Goals   Comments We will continue to provide nutritional education and monitor.      Intervention Plan   Intervention Nutrition handout(s) given to patient.             Nutrition Assessments:  Nutrition Assessments - 08/08/20 1256       MEDFICTS Scores   Post Score 84            MEDIFICTS Score Key: ?70 Need to make dietary changes  40-70 Heart Healthy Diet ? 40 Therapeutic Level Cholesterol Diet   Picture Your Plate Scores: <16 Unhealthy dietary pattern with much room for improvement. 41-50 Dietary pattern unlikely to meet recommendations for good health and room for improvement. 51-60 More healthful dietary pattern, with some room for improvement.  >60 Healthy dietary pattern, although there may be some specific behaviors that could be improved.     Nutrition Goals Re-Evaluation:   Nutrition Goals Discharge (Final Nutrition Goals Re-Evaluation):   Psychosocial: Target Goals: Acknowledge presence or absence of significant depression and/or stress, maximize coping skills, provide positive  support system. Participant is able to verbalize types and ability to use techniques and skills needed for reducing stress and depression.  Initial Review & Psychosocial Screening:  Initial Psych Review & Screening - 08/08/20 1255       Initial Review   Current issues with Current Stress Concerns    Source of Stress Concerns Family    Comments Grieving the death of his wife.      Family Dynamics   Good Support System? Yes    Comments His two children and his grandchildren are his support system.      Barriers   Psychosocial barriers to participate in program The patient should benefit from training in stress management and relaxation.      Screening Interventions   Interventions Encouraged to exercise;Provide feedback about the scores to participant;To provide support and resources with identified psychosocial needs    Expected Outcomes Long Term Goal: Stressors or current issues are controlled or eliminated.;Short Term goal: Identification and review with participant of any Quality of Life or Depression concerns found by scoring the questionnaire.;Long Term goal: The participant improves quality of Life and PHQ9 Scores as seen by post scores and/or verbalization of changes             Quality of Life Scores:  Quality of Life - 08/08/20 1435       Quality of Life   Select Quality of Life      Quality of Life Scores   Health/Function Pre 9.34 %    Socioeconomic Pre 16.4 %    Psych/Spiritual Pre 15.08 %    Family Pre 23.63 %    GLOBAL Pre 13.44 %            Scores of 19 and below usually indicate a poorer quality of life in these areas.  A difference of  2-3 points is a clinically meaningful difference.  A difference of  2-3 points in the total score of the Quality of Life Index has been associated with significant improvement in overall quality of life, self-image, physical symptoms, and general health in studies assessing change in quality of life.   PHQ-9: Recent Review Flowsheet Data     Depression screen [facility] 2/9 10/23/2020 08/08/2020 06/25/2020 02/13/2019   Decreased Interest 0 3  0 0   Down, Depressed, Hopeless 0 0 0 0   PHQ - 2 Score 0 3 0 0   Altered sleeping - 0 - -   Tired, decreased energy - 3 - -   Change in appetite - 0 - -   Feeling bad or failure about yourself  - 0 - -   Trouble concentrating - 0 - -   Moving slowly or fidgety/restless - 0 - -   Suicidal thoughts - 0 - -   PHQ-9 Score - 6 - -   Difficult doing work/chores - Somewhat difficult - -      Interpretation of Total Score  Total Score Depression Severity:  1-4 = Minimal depression, 5-9 = Mild depression, 10-14 = Moderate depression, 15-19 = Moderately severe depression, 20-27 = Severe depression   Psychosocial Evaluation and Intervention:  Psychosocial Evaluation - 08/08/20 1438       Psychosocial Evaluation & Interventions   Interventions Stress management education;Relaxation education;Encouraged to exercise with the program and follow exercise prescription    Comments Pt has no barriers to participating in pulmonary rehab. He is still grieving the death of his wife who died 3 years ago. They were married  for 58 years. He reports that he no longer enjoys doing a lot of the things that he used to due to his sadness. He stated that he tried to attend some grief support groups at first, but he states he thought that they were not beneficial at all. He was defensive when talking about the subject of his wife's death and when I asked if he needed to talk to a councelor. He states that he will cope on his own, but I do think he would benefit from professional support. He reports that he has a good support system now with his 2 children  and his grandchildren. A lot of his other problems stem from his wife's death. He reports that she did all of the cooking and he had never learned to cook, so now his diet has gotten unhealthy. Additionally, he states that he let himself go physically after her death. This has made the SOB from his COPD even worse. He scored a 6 on his PHQ-9 and a 13.44 on his QOL. His decreased QOL is associated with his grieving and his health. He does have optimism that the pulmonary rehab program will better his health and help him breath better. He will benefit from stress management education, relaxation education, and overall social support from staff and other patients.    Expected Outcomes Through education and exercise, the patient's stressors will be reduced.    Continue Psychosocial Services  Follow up required by staff             Psychosocial Re-Evaluation:  Psychosocial Re-Evaluation     [place] Name 08/13/20 1343 09/08/20 1408 10/08/20 1246 10/28/20 1525       Psychosocial Re-Evaluation   Current issues with None Identified None Identified None Identified None Identified    Comments Patient is new in program . His initial QOL score is 13.44 and his PHQ-9 was 6. He seems to enjoy coming and interacting with other group members and staff. He was referred to pulmonary rehav by Dr. Ander Slade with a diagnosis of COPDwith acute exacerbation.  His personal goals for the program are to improve SOB, lose weight and get stronger.  We will continue to monitor as he works toward meeting these goals. Patient has completed 8 sessions.   He seems to enjoy coming to class and interacting with other group members and staff.  His personal goals for the program are to improve SOB, lose weight and get stronger.  We will continue to monitor as he works toward meeting these goals.  He is doing well in the program with progression and consistent attendance. Patient has completed 14 sessions.  He continues to have no  psychosocial issues identified. He continues to enjoy coming to class and interacting with other group members and staff. We will continue to monitor. Patient has completed 19 sessions.  He continues to have no psychosocial issues identified. Talks about his wife and how much he misses her. Continues to grieve some for her. He continues to enjoy coming to class and interacting with other group members and staff. We will continue to monitor.    Expected Outcomes Patient will have no psychosocial barriers identified at discharge. Patient will have no psychosocial barriers identified at discharge. Patient will have no psychosocial barriers identified at discharge. Patient will have no psychosocial barriers identified at discharge.    Interventions Stress management education;Relaxation education;Encouraged to attend Pulmonary Rehabilitation for the exercise Stress management education;Relaxation education;Encouraged to attend Pulmonary Rehabilitation for the exercise  Stress management education;Relaxation education;Encouraged to attend Pulmonary Rehabilitation for the exercise Stress management education;Relaxation education;Encouraged to attend Pulmonary Rehabilitation for the exercise    Continue Psychosocial Services  No Follow up required No Follow up required No Follow up required No Follow up required             Psychosocial Discharge (Final Psychosocial Re-Evaluation):  Psychosocial Re-Evaluation - 10/28/20 1525       Psychosocial Re-Evaluation   Current issues with None Identified    Comments Patient has completed 19 sessions.  He continues to have no psychosocial issues identified. Talks about his wife and how much he misses her. Continues to grieve some for her. He continues to enjoy coming to class and interacting with other group members and staff. We will continue to monitor.    Expected Outcomes Patient will have no psychosocial barriers identified at discharge.    Interventions Stress  management education;Relaxation education;Encouraged to attend Pulmonary Rehabilitation for the exercise    Continue Psychosocial Services  No Follow up required              Education: Education Goals: Education classes will be provided on a weekly basis, covering required topics. Participant will state understanding/return demonstration of topics presented.  Learning Barriers/Preferences:  Learning Barriers/Preferences - 08/08/20 1304       Learning Barriers/Preferences   Learning Barriers Hearing    Learning Preferences Written Material;Audio;Skilled Demonstration             Education Topics: How Lungs Work and Diseases: - Discuss the anatomy of the lungs and diseases that can affect the lungs, such as COPD. Flowsheet Row PULMONARY REHAB CHRONIC OBSTRUCTIVE PULMONARY DISEASE from 10/30/2020 in [place]  Date 08/21/20  Educator DJ  Instruction Review Code 1- Verbalizes Understanding       Exercise: -Discuss the importance of exercise, FITT principles of exercise, normal and abnormal responses to exercise, and how to exercise safely.   Environmental Irritants: -Discuss types of environmental irritants and how to limit exposure to environmental irritants. Flowsheet Row PULMONARY REHAB CHRONIC OBSTRUCTIVE PULMONARY DISEASE from 10/30/2020 in [place]  Date 08/28/20  Educator [facility]  Instruction Review Code 1- Verbalizes Understanding       Meds/Inhalers and oxygen: - Discuss respiratory medications, definition of an inhaler and oxygen, and the proper way to use an inhaler and oxygen. Flowsheet Row PULMONARY REHAB CHRONIC OBSTRUCTIVE PULMONARY DISEASE from 10/30/2020 in [place]  Date 09/04/20  Educator [facility]       Energy Saving Techniques: - Discuss methods to conserve energy and decrease shortness of breath when performing activities of daily living.  Flowsheet Row PULMONARY REHAB CHRONIC  OBSTRUCTIVE PULMONARY DISEASE from 10/30/2020 in [place]  Date 09/11/20  Educator mk  Instruction Review Code 2- Demonstrated Understanding       Bronchial Hygiene / Breathing Techniques: - Discuss breathing mechanics, pursed-lip breathing technique,  proper posture, effective ways to clear airways, and other functional breathing techniques   Cleaning Equipment: - Provides group verbal and written instruction about the health risks of elevated stress, cause of high stress, and healthy ways to reduce stress. Flowsheet Row PULMONARY REHAB CHRONIC OBSTRUCTIVE PULMONARY DISEASE from 10/30/2020 in [place]  Date 09/25/20  Educator pb  Instruction Review Code 2- Demonstrated Understanding       Nutrition I: Fats: - Discuss the types of cholesterol, what cholesterol does to the body, and how cholesterol levels can be  controlled.   Nutrition II: Labels: -Discuss the different components of food labels and how to read food labels.   Respiratory Infections: - Discuss the signs and symptoms of respiratory infections, ways to prevent respiratory infections, and the importance of seeking medical treatment when having a respiratory infection. Flowsheet Row PULMONARY REHAB CHRONIC OBSTRUCTIVE PULMONARY DISEASE from 10/30/2020 in [place]  Date 10/16/20  Educator PB  Instruction Review Code 1- Verbalizes Understanding       Stress I: Signs and Symptoms: - Discuss the causes of stress, how stress may lead to anxiety and depression, and ways to limit stress. Flowsheet Row PULMONARY REHAB CHRONIC OBSTRUCTIVE PULMONARY DISEASE from 10/30/2020 in [place]  Date 10/23/20  Educator mk  Instruction Review Code 2- Demonstrated Understanding       Stress II: Relaxation: -Discuss relaxation techniques to limit stress. Flowsheet Row PULMONARY REHAB CHRONIC OBSTRUCTIVE PULMONARY DISEASE from  10/30/2020 in [place]  Date 10/30/20  Educator DJ  Instruction Review Code 1- Verbalizes Understanding       Oxygen for Home/Travel: - Discuss how to prepare for travel when on oxygen and proper ways to transport and store oxygen to ensure safety.   Knowledge Questionnaire Score:  Knowledge Questionnaire Score - 08/08/20 1304       Knowledge Questionnaire Score   Pre Score 14/18             Core Components/Risk Factors/Patient Goals at Admission:  Personal Goals and Risk Factors at Admission - 08/08/20 1304       Core Components/Risk Factors/Patient Goals on Admission    Weight Management Yes;Weight Loss    Intervention Weight Management: Develop a combined nutrition and exercise program designed to reach desired caloric intake, while maintaining appropriate intake of nutrient and fiber, sodium and fats, and appropriate energy expenditure required for the weight goal.;Weight Management: Provide education and appropriate resources to help participant work on and attain dietary goals.;Weight Management/Obesity: Establish reasonable short term and long term weight goals.;Obesity: Provide education and appropriate resources to help participant work on and attain dietary goals.    Expected Outcomes Short Term: Continue to assess and modify interventions until short term weight is achieved;Long Term: Adherence to nutrition and physical activity/exercise program aimed toward attainment of established weight goal;Weight Loss: Understanding of general recommendations for a balanced deficit meal plan, which promotes 1-2 lb weight loss per week and includes a negative energy balance of [phone] kcal/d;Understanding recommendations for meals to include 15-35% energy as protein, 25-35% energy from fat, 35-60% energy from carbohydrates, less than 200mg  of dietary cholesterol, 20-35 gm of total fiber daily;Understanding of distribution of calorie intake throughout the day  with the consumption of 4-5 meals/snacks    Improve shortness of breath with ADL's Yes    Intervention Provide education, individualized exercise plan and daily activity instruction to help decrease symptoms of SOB with activities of daily living.    Expected Outcomes Short Term: Improve cardiorespiratory fitness to achieve a reduction of symptoms when performing ADLs;Long Term: Be able to perform more ADLs without symptoms or delay the onset of symptoms    Stress Yes    Intervention Offer individual and/or small group education and counseling on adjustment to heart disease, stress management and health-related lifestyle change. Teach and support self-help strategies.;Refer participants experiencing significant psychosocial distress to appropriate mental health specialists for further evaluation and treatment. When possible, include family members and significant others in education/counseling sessions.    Expected Outcomes Long  Term: Emotional wellbeing is indicated by absence of clinically significant psychosocial distress or social isolation.;Short Term: Participant demonstrates changes in health-related behavior, relaxation and other stress management skills, ability to obtain effective social support, and compliance with psychotropic medications if prescribed.             Core Components/Risk Factors/Patient Goals Review:   Goals and Risk Factor Review     Row Name 08/13/20 1357 09/08/20 1415 10/08/20 1248 10/28/20 1529       Core Components/Risk Factors/Patient Goals Review   Personal Goals Review Improve shortness of breath with ADL's Improve shortness of breath with ADL's Improve shortness of breath with ADL's Improve shortness of breath with ADL's    Review Patient is new in the program.  His O2 sats was good on room air at 95-96% while exercising.  He seems to have a positive outlook and very interative with others in class.  His personal goals are to get stronger and have more energy  and increase ADL's.  We will continue to monitor as he works toward meeting these goals. Patient has completed 8 sessions.  His O2 sats was good on room air at 93-96% while exercising. Had appointment with Dr. Luna Glasgow on 08-21-20. He continues to have pain in left shoulder. no changes made in medication.  Continue on current medication regimen. He seems to have a positive outlook and very interative with others in class.  His personal goals are to get stronger and have more energy and increase ADL's.  We will continue to monitor as he works toward meeting these goals. Patient has completed 14 sessions gaining 1 lb since last 30 day review. He continue to do well in the program with consistent attendance and progressions. His blood pressure is well controlled and his O2 saturations are averaging 96% on RA while exercising. He saw his pulmonologist 5/5 for a routine check. No changes made. Patient told MD that PR was benefiting him. He continue sto have pain in his left shoulder and plans to start PT for this after he completes PR. His personal goals for the program are to lose weight; have less SOB; and get stronger. We will continue to monitor as he works toaward meeting these goals. Patient has completed 19 sessions.  Weight remains same for the past 4 weeks at 90.6 kg. He continue to do well in the program with consistent attendance and progressions. His blood pressure is well controlled and his O2 saturations are averaging between 94-96% on RA while exercising on the nu-step.  He continue to have pain in his left shoulder and plans to start PT for this after he completes PR. His personal goals for the program are to lose weight; have less SOB; and get stronger. We will continue to monitor as he works toaward meeting these goals.    Expected Outcomes Patient will complete the program meeting both program and personal goals. Patient will complete the program meeting both program and personal goals. Patient will  complete the program meeting both program and personal goals. Patient will complete the program meeting both program and personal goals.             Core Components/Risk Factors/Patient Goals at Discharge (Final Review):   Goals and Risk Factor Review - 10/28/20 1529       Core Components/Risk Factors/Patient Goals Review   Personal Goals Review Improve shortness of breath with ADL's    Review Patient has completed 19 sessions.  Weight remains same for the  past 4 weeks at 90.6 kg. He continue to do well in the program with consistent attendance and progressions. His blood pressure is well controlled and his O2 saturations are averaging between 94-96% on RA while exercising on the nu-step.  He continue to have pain in his left shoulder and plans to start PT for this after he completes PR. His personal goals for the program are to lose weight; have less SOB; and get stronger. We will continue to monitor as he works toaward meeting these goals.    Expected Outcomes Patient will complete the program meeting both program and personal goals.             ITP Comments:   Comments: ITP REVIEW Pt is making expected progress toward pulmonary rehab goals after completing 22 sessions. Recommend continued exercise, life style modification, education, and utilization of breathing techniques to increase stamina and strength and decrease shortness of breath with exertion.

## 2020-11-05 NOTE — Addendum Note (Signed)
Addended by: Levonne Hubert on: 11/05/2020 10:57 AM   Modules accepted: Orders

## 2020-11-05 NOTE — Patient Instructions (Signed)
Medication Instructions:  Your physician recommends that you continue on your current medications as directed. Please refer to the Current Medication list given to you today.  *If you need a refill on your cardiac medications before your next appointment, please call your pharmacy*   Lab Work: Your physician recommends that you return for lab work in: Today BMET   If you have labs (blood work) drawn today and your tests are completely normal, you will receive your results only by: MyChart Message (if you have MyChart) OR A paper copy in the mail If you have any lab test that is abnormal or we need to change your treatment, we will call you to review the results.   Testing/Procedures: Non-Cardiac CT scanning, (CAT scanning), is a noninvasive, special x-ray that produces cross-sectional images of the body using x-rays and a computer. CT scans help physicians diagnose and treat medical conditions. For some CT exams, a contrast material is used to enhance visibility in the area of the body being studied. CT scans provide greater clarity and reveal more details than regular x-ray exams.    Follow-Up: At [facility], you and your health needs are our priority.  As part of our continuing mission to provide you with exceptional heart care, we have created designated Provider Care Teams.  These Care Teams include your primary Cardiologist (physician) and Advanced Practice Providers (APPs -  Physician Assistants and Nurse Practitioners) who all work together to provide you with the care you need, when you need it.  We recommend signing up for the patient portal called "MyChart".  Sign up information is provided on this After Visit Summary.  MyChart is used to connect with patients for Virtual Visits (Telemedicine).  Patients are able to view lab/test results, encounter notes, upcoming appointments, etc.  Non-urgent messages can be sent to your provider as well.   To learn more about what you can do  with MyChart, go to [email or web address].    Your next appointment:   6 month(s)  The format for your next appointment:   In Person  Provider:   Jenkins Rouge, MD   Other Instructions Thank you for choosing [facility]!

## 2020-11-06 ENCOUNTER — Encounter (HOSPITAL_COMMUNITY): Payer: Medicare Other

## 2020-11-11 ENCOUNTER — Other Ambulatory Visit: Payer: Self-pay

## 2020-11-11 ENCOUNTER — Encounter (HOSPITAL_COMMUNITY)
Admission: RE | Admit: 2020-11-11 | Discharge: 2020-11-11 | Disposition: A | Discharge status: Home / Self Care | Payer: Medicare Other | Source: Ambulatory Visit | Attending: Pulmonary Disease | Admitting: Pulmonary Disease

## 2020-11-11 DIAGNOSIS — J441 Chronic obstructive pulmonary disease with (acute) exacerbation: Secondary | ICD-10-CM

## 2020-11-11 NOTE — Progress Notes (Signed)
Daily Session Note  Patient Details  Name: [name] [name] MRN: [id number] Date of Birth: [birth year] Referring Provider:   [place] from 08/08/2020 in [place]  Referring Provider Dr. Ander Slade       Encounter Date: 11/11/2020  Check In:  Session Check In - 11/11/20 1330       Check-In   Supervising physician immediately available to respond to emergencies [facility] MD immediately available    Physician(s) Dr. Harl Bowie    Location [facility]    Staff Present Geanie Cooley, RN;Dalton Kris Mouton, MS, ACSM-CEP, Exercise Physiologist    Virtual Visit No    Medication changes reported     No    Fall or balance concerns reported    No    Tobacco Cessation No Change    Warm-up and Cool-down Performed as group-led instruction    Resistance Training Performed Yes    VAD Patient? No    PAD/SET Patient? No      Pain Assessment   Currently in Pain? No/denies    Pain Score 0-No pain    Multiple Pain Sites No             Capillary Blood Glucose: No results found for this or any previous visit (from the past 24 hour(s)).    Social History   Tobacco Use  Smoking Status Former   Packs/day: 1.00   Years: 35.00   Pack years: 35.00   Types: Cigarettes   Quit date: 03/28/2003   Years since quitting: 17.6  Smokeless Tobacco Never    Goals Met:  Proper associated with RPD/PD & O2 Sat Independence with exercise equipment Using PLB without cueing & demonstrates good technique Exercise tolerated well No report of cardiac concerns or symptoms Strength training completed today  Goals Unmet:  Not Applicable  Comments: check out @ 3:30pm   Dr. Kathie Dike is Medical Director for [facility] Pulmonary Re[facility].

## 2020-11-12 ENCOUNTER — Encounter: Payer: Self-pay | Admitting: Family Medicine

## 2020-11-13 ENCOUNTER — Encounter (HOSPITAL_COMMUNITY)
Admission: RE | Admit: 2020-11-13 | Discharge: 2020-11-13 | Disposition: A | Discharge status: Home / Self Care | Payer: Medicare Other | Source: Ambulatory Visit | Attending: Pulmonary Disease | Admitting: Pulmonary Disease

## 2020-11-13 ENCOUNTER — Other Ambulatory Visit: Payer: Self-pay

## 2020-11-13 DIAGNOSIS — J441 Chronic obstructive pulmonary disease with (acute) exacerbation: Secondary | ICD-10-CM | POA: Diagnosis not present

## 2020-11-13 NOTE — Progress Notes (Signed)
Daily Session Note  Patient Details  Name: [name] [name] MRN: [id number] Date of Birth: 05-26-1940 Referring Provider:   [place] from 08/08/2020 in [place]  Referring Provider Dr. Ander Slade       Encounter Date: 11/13/2020  Check In:  Session Check In - 11/13/20 1330       Check-In   Supervising physician immediately available to respond to emergencies [facility] MD immediately available    Physician(s) Dr. Domenic Polite    Location [facility]    Staff Present Geanie Cooley, RN;Dalton Fletcher, MS, ACSM-CEP, Exercise Physiologist    Virtual Visit No    Medication changes reported     No    Fall or balance concerns reported    No    Tobacco Cessation No Change    Warm-up and Cool-down Performed as group-led instruction    Resistance Training Performed Yes    VAD Patient? No    PAD/SET Patient? No      Pain Assessment   Currently in Pain? No/denies    Pain Score 0-No pain    Multiple Pain Sites No             Capillary Blood Glucose: No results found for this or any previous visit (from the past 24 hour(s)).    Social History   Tobacco Use  Smoking Status Former   Packs/day: 1.00   Years: 35.00   Pack years: 35.00   Types: Cigarettes   Quit date: 03/28/2003   Years since quitting: 17.6  Smokeless Tobacco Never    Goals Met:  Proper associated with RPD/PD & O2 Sat Independence with exercise equipment Using PLB without cueing & demonstrates good technique Exercise tolerated well No report of cardiac concerns or symptoms Strength training completed today  Goals Unmet:  Not Applicable  Comments: check out @ 2:30pm   Dr. Kathie Dike is Medical Director for [facility] Pulmonary Rehab.

## 2020-11-14 ENCOUNTER — Encounter: Payer: Self-pay | Admitting: Podiatry

## 2020-11-14 ENCOUNTER — Ambulatory Visit (INDEPENDENT_AMBULATORY_CARE_PROVIDER_SITE_OTHER): Payer: Medicare Other | Admitting: Podiatry

## 2020-11-14 DIAGNOSIS — I2510 Atherosclerotic heart disease of native coronary artery without angina pectoris: Secondary | ICD-10-CM | POA: Diagnosis not present

## 2020-11-14 DIAGNOSIS — G629 Polyneuropathy, unspecified: Secondary | ICD-10-CM | POA: Diagnosis not present

## 2020-11-14 DIAGNOSIS — B351 Tinea unguium: Secondary | ICD-10-CM | POA: Diagnosis not present

## 2020-11-14 DIAGNOSIS — L600 Ingrowing nail: Secondary | ICD-10-CM | POA: Diagnosis not present

## 2020-11-18 ENCOUNTER — Other Ambulatory Visit: Payer: Self-pay

## 2020-11-18 ENCOUNTER — Encounter (HOSPITAL_COMMUNITY)
Admission: RE | Admit: 2020-11-18 | Discharge: 2020-11-18 | Disposition: A | Discharge status: Home / Self Care | Payer: Medicare Other | Source: Ambulatory Visit | Attending: Pulmonary Disease | Admitting: Pulmonary Disease

## 2020-11-18 VITALS — Wt 198.2 lb

## 2020-11-18 DIAGNOSIS — J441 Chronic obstructive pulmonary disease with (acute) exacerbation: Secondary | ICD-10-CM | POA: Diagnosis not present

## 2020-11-18 NOTE — Progress Notes (Signed)
Daily Session Note  Patient Details  Name: [name] [name] MRN: [id number] Date of Birth: 1940-08-23 Referring Provider:   Flowsheet Row PULMONARY REHAB OTHER RESP ORIENTATION from 08/08/2020 in [place]  Referring Provider Dr. Ander Slade       Encounter Date: 11/18/2020  Check In:  Session Check In - 11/18/20 1330       Check-In   Supervising physician immediately available to respond to emergencies [facility] MD immediately available    Physician(s) Dr. Johnsie Cancel    Location [facility]    Staff Present Geanie Cooley, RN;Dalton Fletcher, MS, ACSM-CEP, Exercise Physiologist    Virtual Visit No    Medication changes reported     No    Fall or balance concerns reported    No    Tobacco Cessation No Change    Warm-up and Cool-down Performed as group-led instruction    Resistance Training Performed Yes    VAD Patient? No    PAD/SET Patient? No      Pain Assessment   Currently in Pain? No/denies    Pain Score 0-No pain    Multiple Pain Sites No             Capillary Blood Glucose: No results found for this or any previous visit (from the past 24 hour(s)).    Social History   Tobacco Use  Smoking Status Former   Packs/day: 1.00   Years: 35.00   Pack years: 35.00   Types: Cigarettes   Quit date: 03/28/2003   Years since quitting: 17.6  Smokeless Tobacco Never    Goals Met:  Proper associated with RPD/PD & O2 Sat Independence with exercise equipment Using PLB without cueing & demonstrates good technique Exercise tolerated well No report of cardiac concerns or symptoms Strength training completed today  Goals Unmet:  Not Applicable  Comments: check out @ 2:30pm   Dr. Kathie Dike is Medical Director for [facility] Pulmonary Rehab.

## 2020-11-19 NOTE — Progress Notes (Signed)
Subjective:   Patient ID: [name] [name], male   DOB: 80 y.o.   MRN: [id number]   HPI Patient presents concerned about the right hallux nail being thickened and whether removal that should be done or whether medications or other treatments could be done.  States has been this way for a long time and patient does not smoke likes to be active   Review of Systems  All other systems reviewed and are negative.      Objective:  Physical Exam Vitals and nursing note reviewed.  Constitutional:      Appearance: He is well-developed.  Pulmonary:     Effort: Pulmonary effort is normal.  Musculoskeletal:        General: Normal range of motion.  Skin:    General: Skin is warm.  Neurological:     Mental Status: He is alert.    Neurovascular status intact muscle strength adequate range of motion adequate with the hallux nail right being very thickened dystrophic and only mildly tender with flaking-like appearance.  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  And at this point debridement will be accomplished with no other active treatment due to age and nonpainful condition.  Encouraged him to come in if any changes should occur

## 2020-11-20 ENCOUNTER — Other Ambulatory Visit: Payer: Self-pay

## 2020-11-20 ENCOUNTER — Encounter (HOSPITAL_COMMUNITY)
Admission: RE | Admit: 2020-11-20 | Discharge: 2020-11-20 | Disposition: A | Discharge status: Home / Self Care | Payer: Medicare Other | Source: Ambulatory Visit | Attending: Pulmonary Disease | Admitting: Pulmonary Disease

## 2020-11-20 DIAGNOSIS — J441 Chronic obstructive pulmonary disease with (acute) exacerbation: Secondary | ICD-10-CM

## 2020-11-20 NOTE — Progress Notes (Signed)
Daily Session Note  Patient Details  Name: [name] [name] MRN: [id number] Date of Birth: [birth year] Referring Provider:   Flowsheet Row PULMONARY REHAB OTHER RESP ORIENTATION from 08/08/2020 in [place]  Referring Provider Dr. Ander Slade       Encounter Date: 11/20/2020  Check In:  Session Check In - 11/20/20 1330       Check-In   Supervising physician immediately available to respond to emergencies [facility] MD immediately available    Physician(s) Dr. Harrington Challenger    Location [facility]    Staff Present Geanie Cooley, RN;Dalton Kris Mouton, MS, ACSM-CEP, Exercise Physiologist;Debra Wynetta Emery, RN, BSN    Virtual Visit No    Medication changes reported     No    Fall or balance concerns reported    No    Tobacco Cessation No Change    Warm-up and Cool-down Performed as group-led instruction    Resistance Training Performed Yes    VAD Patient? No    PAD/SET Patient? No      Pain Assessment   Currently in Pain? No/denies    Pain Score 0-No pain    Multiple Pain Sites No             Capillary Blood Glucose: No results found for this or any previous visit (from the past 24 hour(s)).    Social History   Tobacco Use  Smoking Status Former   Packs/day: 1.00   Years: 35.00   Pack years: 35.00   Types: Cigarettes   Quit date: 03/28/2003   Years since quitting: 17.6  Smokeless Tobacco Never    Goals Met:  Proper associated with RPD/PD & O2 Sat Independence with exercise equipment Using PLB without cueing & demonstrates good technique Exercise tolerated well No report of cardiac concerns or symptoms Strength training completed today  Goals Unmet:  Not Applicable  Comments: check out @ 2:30pm   Dr. Kathie Dike is Medical Director for [facility] Pulmonary Rehab.

## 2020-11-25 ENCOUNTER — Encounter (HOSPITAL_COMMUNITY): Payer: Medicare Other

## 2020-11-25 DIAGNOSIS — H9193 Unspecified hearing loss, bilateral: Secondary | ICD-10-CM | POA: Diagnosis not present

## 2020-11-25 DIAGNOSIS — J449 Chronic obstructive pulmonary disease, unspecified: Secondary | ICD-10-CM | POA: Diagnosis not present

## 2020-11-25 DIAGNOSIS — R051 Acute cough: Secondary | ICD-10-CM | POA: Diagnosis not present

## 2020-11-25 DIAGNOSIS — H3323 Serous retinal detachment, bilateral: Secondary | ICD-10-CM | POA: Diagnosis not present

## 2020-11-25 DIAGNOSIS — H409 Unspecified glaucoma: Secondary | ICD-10-CM | POA: Diagnosis not present

## 2020-11-25 DIAGNOSIS — I714 Abdominal aortic aneurysm, without rupture: Secondary | ICD-10-CM | POA: Diagnosis not present

## 2020-11-25 DIAGNOSIS — Z9049 Acquired absence of other specified parts of digestive tract: Secondary | ICD-10-CM | POA: Diagnosis not present

## 2020-11-26 DIAGNOSIS — H3581 Retinal edema: Secondary | ICD-10-CM | POA: Diagnosis not present

## 2020-11-26 DIAGNOSIS — H35372 Puckering of macula, left eye: Secondary | ICD-10-CM | POA: Diagnosis not present

## 2020-11-26 DIAGNOSIS — H43811 Vitreous degeneration, right eye: Secondary | ICD-10-CM | POA: Diagnosis not present

## 2020-11-27 ENCOUNTER — Ambulatory Visit (HOSPITAL_COMMUNITY)
Admission: RE | Admit: 2020-11-27 | Discharge: 2020-11-27 | Disposition: A | Discharge status: Home / Self Care | Payer: Medicare Other | Source: Ambulatory Visit | Attending: Cardiovascular Disease | Admitting: Cardiovascular Disease

## 2020-11-27 ENCOUNTER — Encounter (HOSPITAL_COMMUNITY)
Admission: RE | Admit: 2020-11-27 | Discharge: 2020-11-27 | Disposition: A | Discharge status: Home / Self Care | Payer: Medicare Other | Source: Ambulatory Visit | Attending: Pulmonary Disease | Admitting: Pulmonary Disease

## 2020-11-27 ENCOUNTER — Other Ambulatory Visit: Payer: Self-pay

## 2020-11-27 DIAGNOSIS — K5730 Diverticulosis of large intestine without perforation or abscess without bleeding: Secondary | ICD-10-CM | POA: Diagnosis not present

## 2020-11-27 DIAGNOSIS — Z9049 Acquired absence of other specified parts of digestive tract: Secondary | ICD-10-CM | POA: Diagnosis not present

## 2020-11-27 DIAGNOSIS — I714 Abdominal aortic aneurysm, without rupture, unspecified: Secondary | ICD-10-CM

## 2020-11-27 DIAGNOSIS — J441 Chronic obstructive pulmonary disease with (acute) exacerbation: Secondary | ICD-10-CM

## 2020-11-27 MED ORDER — IOHEXOL 350 MG/ML SOLN
100.0000 mL | Freq: Once | INTRAVENOUS | Status: AC | PRN
  Administered 2020-11-27: 100 mL via INTRAVENOUS

## 2020-11-27 NOTE — Progress Notes (Signed)
Daily Session Note  Patient Details  Name: [name] [name] MRN: [id number] Date of Birth: Apr 24, 1941 Referring Provider:   Flowsheet Row PULMONARY REHAB OTHER RESP ORIENTATION from 08/08/2020 in [place]  Referring Provider Dr. Ander Slade       Encounter Date: 11/27/2020  Check In:  Session Check In - 11/27/20 1330       Check-In   Supervising physician immediately available to respond to emergencies [facility] MD immediately available    Physician(s) Dr. Harl Bowie    Location [facility]    Staff Present Geanie Cooley, RN;Yousef Huge Kris Mouton, MS, ACSM-CEP, Exercise Physiologist;Debra Wynetta Emery, RN, BSN    Virtual Visit No    Medication changes reported     No    Fall or balance concerns reported    No    Tobacco Cessation No Change    Warm-up and Cool-down Performed as group-led instruction    Resistance Training Performed Yes    VAD Patient? No    PAD/SET Patient? No      Pain Assessment   Currently in Pain? No/denies    Pain Score 0-No pain    Multiple Pain Sites No             Capillary Blood Glucose: No results found for this or any previous visit (from the past 24 hour(s)).    Social History   Tobacco Use  Smoking Status Former   Packs/day: 1.00   Years: 35.00   Pack years: 35.00   Types: Cigarettes   Quit date: 03/28/2003   Years since quitting: 17.6  Smokeless Tobacco Never    Goals Met:  Independence with exercise equipment Exercise tolerated well No report of cardiac concerns or symptoms Strength training completed today  Goals Unmet:  Not Applicable  Comments: checkout time is 1430   Dr. Kathie Dike is Medical Director for [facility] Pulmonary Rehab.

## 2020-11-28 ENCOUNTER — Telehealth: Payer: Self-pay

## 2020-11-28 DIAGNOSIS — I714 Abdominal aortic aneurysm, without rupture, unspecified: Secondary | ICD-10-CM

## 2020-11-28 NOTE — Telephone Encounter (Signed)
Josue Hector, MD  11/27/2020 11:41 AM EDT      Stable AAA needs f/u with [facility] not sure who he sees    Pt notified and voiced understanding. Pt is not currently seeing a provider at [facility]. Referral sent.

## 2020-12-02 ENCOUNTER — Encounter (HOSPITAL_COMMUNITY): Payer: Medicare Other

## 2020-12-02 DIAGNOSIS — H0102B Squamous blepharitis left eye, upper and lower eyelids: Secondary | ICD-10-CM | POA: Diagnosis not present

## 2020-12-02 DIAGNOSIS — H401122 Primary open-angle glaucoma, left eye, moderate stage: Secondary | ICD-10-CM | POA: Diagnosis not present

## 2020-12-02 DIAGNOSIS — H04123 Dry eye syndrome of bilateral lacrimal glands: Secondary | ICD-10-CM | POA: Diagnosis not present

## 2020-12-02 DIAGNOSIS — Z9889 Other specified postprocedural states: Secondary | ICD-10-CM | POA: Diagnosis not present

## 2020-12-02 DIAGNOSIS — H401111 Primary open-angle glaucoma, right eye, mild stage: Secondary | ICD-10-CM | POA: Diagnosis not present

## 2020-12-02 DIAGNOSIS — H3581 Retinal edema: Secondary | ICD-10-CM | POA: Diagnosis not present

## 2020-12-02 DIAGNOSIS — H1045 Other chronic allergic conjunctivitis: Secondary | ICD-10-CM | POA: Diagnosis not present

## 2020-12-02 DIAGNOSIS — Z961 Presence of intraocular lens: Secondary | ICD-10-CM | POA: Diagnosis not present

## 2020-12-02 DIAGNOSIS — H0102A Squamous blepharitis right eye, upper and lower eyelids: Secondary | ICD-10-CM | POA: Diagnosis not present

## 2020-12-03 NOTE — Progress Notes (Signed)
Pulmonary Individual Treatment Plan  Patient Details  Name: [name] [name] MRN: [id number] Date of Birth: September 12, 1940 Referring Provider:   Flowsheet Row PULMONARY REHAB OTHER RESP ORIENTATION from 08/08/2020 in [facility]  Referring Provider Dr. Ander Slade       Initial Encounter Date:  Flowsheet Row PULMONARY REHAB OTHER RESP ORIENTATION from 08/08/2020 in [facility]  Date 08/08/20       Visit Diagnosis: COPD with acute exacerbation ([place])  Patient's Home Medications on Admission:   Current Outpatient Medications:    albuterol (PROVENTIL) (2.5 MG/3ML) 0.083% nebulizer solution, Take 3 mLs (2.5 mg total) by nebulization every 6 (six) hours as needed for wheezing or shortness of breath., Disp: 100 mL, Rfl: 2   albuterol (VENTOLIN HFA) 108 (90 Base) MCG/ACT inhaler, 2 puffs as needed, Disp: , Rfl:    alfuzosin (UROXATRAL) 10 MG 24 hr tablet, Take 1 tablet (10 mg total) by mouth daily with breakfast., Disp: 90 tablet, Rfl: 3   cetirizine (ZYRTEC) 10 MG tablet, Take 10 mg by mouth daily., Disp: , Rfl:    COMBIGAN 0.2-0.5 % ophthalmic solution, Apply 1 drop to eye 2 (two) times daily., Disp: , Rfl:    esomeprazole (NEXIUM) 40 MG capsule, Take 40 mg by mouth daily., Disp: , Rfl:    fluticasone (FLOVENT HFA) 220 MCG/ACT inhaler, Inhale 2 puffs into the lungs daily. (Patient taking differently: Inhale 2 puffs into the lungs in the morning and at bedtime.), Disp: 12 g, Rfl: 5   hydroxypropyl methylcellulose / hypromellose (ISOPTO TEARS / GONIOVISC) 2.5 % ophthalmic solution, Place 1 drop into both eyes 3 (three) times daily as needed for dry eyes., Disp: , Rfl:    LORazepam (ATIVAN) 0.5 MG tablet, Take 1 tablet (0.5 mg total) by mouth at bedtime., Disp: 30 tablet, Rfl: 3   losartan (COZAAR) 25 MG tablet, Take 1 tablet (25 mg total) by mouth daily., Disp: 90 tablet, Rfl: 1   lovastatin (MEVACOR) 40 MG tablet, Take 1 tablet (40 mg total) by mouth daily.,  Disp: 90 tablet, Rfl: 1   nitroGLYCERIN (NITROSTAT) 0.4 MG SL tablet, Place 1 tablet (0.4 mg total) under the tongue every 5 (five) minutes as needed for chest pain., Disp: 25 tablet, Rfl: 5   pseudoephedrine-guaifenesin (MUCINEX D) 60-600 MG 12 hr tablet, Take 1 tablet by mouth 2 (two) times daily as needed for congestion., Disp: , Rfl:    sodium chloride (OCEAN) 0.65 % SOLN nasal spray, Place 1 spray into both nostrils as needed for congestion., Disp: , Rfl:   Past Medical History: Past Medical History:  Diagnosis Date   AAA (abdominal aortic aneurysm) ([place])    needs yearly ultrasound   Allergy    Anemia    Arthritis    Asthma    BCC (basal cell carcinoma) 08/18/1989   left shoulder blad, upper right arm, left arm beyond elbow, c&d   BCC (basal cell carcinoma) 01/31/1992   Posterior neck, curetx3, 57fu   BCC (basal cell carcinoma) 11/22/2001   mid forehead, cx3, excision, right forearm, cx3, 18fu   BCC (basal cell carcinoma) 10/09/2003   mid forehead, MOHs   BCC (basal cell carcinoma) 08/15/2008   upper left back, biopsy   BPH (benign prostatic hyperplasia)    CAD (coronary artery disease)    Cancer (HCC)    skin cancer   COPD (chronic obstructive pulmonary disease) ([place])    Dysrhythmia    pt. states it can be fast at times  GERD (gastroesophageal reflux disease)    Glaucoma    HOH (hard of hearing)    Hypercholesterolemia    Hypertension    Impaired fasting glucose    Low back pain    Melanoma in situ ([place]) 10/09/2003   left chin, MOHs   MI (myocardial infarction) ([place]) 1999   SCC (squamous cell carcinoma) 07/03/2014   in situ, behind left ear, cx3, cautery, 67fu   SCC (squamous cell carcinoma) 07/03/2014   well diff, left forearm, biopsy, cx1, cautery   SCC (squamous cell carcinoma) 07/20/2017   in situ, left upper arm, cx3, 66fu   SCC (squamous cell carcinoma) 01/10/2019   in situ, left post shoulder, cx3, 90fu   SCC (squamous cell carcinoma) 11/22/2001   left  forearm distal, left forearm, cx3, 17fu   SCC (squamous cell carcinoma) 10/09/2003   [place], left ear post, clear per st, right cheek clear   SCC (squamous cell carcinoma) 03/30/2004   in situ, left upper arm, cx3, 43fu   SCC (squamous cell carcinoma) 03/08/2005   in situ, right cheek, mid upper forehead, cx3, 10fu   SCC (squamous cell carcinoma) 06/08/2006   in situ, left shoulder, cx3, 7fu   SCC (squamous cell carcinoma) 05/05/2010   right inner wrist, biopsy   SCC (squamous cell carcinoma) 09/13/2013   in situ, right crown scalp, front scalp, biopsy   [place]     Tobacco Use: Social History   Tobacco Use  Smoking Status Former   Packs/day: 1.00   Years: 35.00   Pack years: 35.00   Types: Cigarettes   Quit date: 03/28/2003   Years since quitting: 17.6  Smokeless Tobacco Never    Labs: Recent Review Flowsheet Data     Labs for ITP Cardiac and Pulmonary Rehab Latest Ref Rng & Units 06/23/2017 02/14/2019 09/05/2019 11/12/2019 09/05/2020   Cholestrol 100 - 199 mg/dL 122 117 124 107 129   LDLCALC 0 - 99 mg/dL 67 61 63 61 78   HDL >39 mg/dL 43 38(L) 46 35(L) 37(L)   Trlycerides 0 - 149 mg/dL 61 93 71 54 70       Capillary Blood Glucose: Lab Results  Component Value Date   GLUCAP 97 09/17/2016   GLUCAP 145 (H) 09/16/2016     Pulmonary Assessment Scores:  Pulmonary Assessment Scores     Row Name 08/08/20 1254         ADL UCSD   SOB Score total 22           CAT Score     CAT Score 19           mMRC Score     mMRC Score 2            UCSD: Self-administered rating of dyspnea associated with activities of daily living (ADLs) 6-point scale (0 = "not at all" to 5 = "maximal or unable to do because of breathlessness")  Scoring Scores range from 0 to 120.  Minimally important difference is 5 units  CAT: CAT can identify the health impairment of COPD patients and is better correlated with disease progression.  CAT has a scoring range of zero to 40. The CAT score  is classified into four groups of low (less than 10), medium (10 - 20), high (21-30) and very high (31-40) based on the impact level of disease on health status. A CAT score over 10 suggests significant symptoms.  A worsening CAT score could be explained by an exacerbation, poor medication adherence,  poor inhaler technique, or progression of COPD or comorbid conditions.  CAT MCID is 2 points  mMRC: mMRC (Modified Medical Research Council) Dyspnea Scale is used to assess the degree of baseline functional disability in patients of respiratory disease due to dyspnea. No minimal important difference is established. A decrease in score of 1 point or greater is considered a positive change.   Pulmonary Function Assessment:   Exercise Target Goals: Exercise Program Goal: Individual exercise prescription set using results from initial 6 min walk test and THRR while considering  patient's activity barriers and safety.   Exercise Prescription Goal: Initial exercise prescription builds to 30-45 minutes a day of aerobic activity, 2-3 days per week.  Home exercise guidelines will be given to patient during program as part of exercise prescription that the participant will acknowledge.  Activity Barriers & Risk Stratification:  Activity Barriers & Cardiac Risk Stratification - 08/08/20 1253       Activity Barriers & Cardiac Risk Stratification   Activity Barriers Arthritis;Back Problems;Neck/Spine Problems;Deconditioning;Muscular Weakness;Shortness of Breath    Cardiac Risk Stratification High             6 Minute Walk:  6 Minute Walk     Row Name 08/08/20 1428         6 Minute Walk   Phase Initial     Distance 1200 feet     Walk Time 6 minutes     # of Rest Breaks 0     MPH 2.27     METS 2.43     RPE 11     Perceived Dyspnea  13     VO2 Peak 8.51     Resting HR 72 bpm     Resting BP 112/74     Resting Oxygen Saturation  96 %     Exercise Oxygen Saturation  during 6 min walk 91 %      Max Ex. HR 99 bpm     Max Ex. BP 128/64     2 Minute Post BP 106/70           Interval HR     1 Minute HR 96     2 Minute HR 99     3 Minute HR 97     4 Minute HR 93     5 Minute HR 96     6 Minute HR 96     2 Minute Post HR 83     Interval Heart Rate? Yes           Interval Oxygen     Interval Oxygen? Yes     Baseline Oxygen Saturation % 96 %     1 Minute Oxygen Saturation % 93 %     1 Minute Liters of Oxygen 0 L     2 Minute Oxygen Saturation % 91 %     2 Minute Liters of Oxygen 0 L     3 Minute Oxygen Saturation % 92 %     3 Minute Liters of Oxygen 0 L     4 Minute Oxygen Saturation % 91 %     4 Minute Liters of Oxygen 0 L     5 Minute Oxygen Saturation % 92 %     5 Minute Liters of Oxygen 0 L     6 Minute Oxygen Saturation % 92 %     6 Minute Liters of Oxygen 0 L     2 Minute Post Oxygen Saturation % 96 %  2 Minute Post Liters of Oxygen 0 L             Oxygen Initial Assessment:  Oxygen Initial Assessment - 08/08/20 1433       Initial 6 min Walk   Oxygen Used None      Program Oxygen Prescription   Program Oxygen Prescription None      Intervention   Short Term Goals To learn and exhibit compliance with exercise, home and travel O2 prescription;To learn and understand importance of monitoring SPO2 with pulse oximeter and demonstrate accurate use of the pulse oximeter.;To learn and understand importance of maintaining oxygen saturations>88%;To learn and demonstrate proper pursed lip breathing techniques or other breathing techniques. ;To learn and demonstrate proper use of respiratory medications    Long  Term Goals Exhibits compliance with exercise, home  and travel O2 prescription;Verbalizes importance of monitoring SPO2 with pulse oximeter and return demonstration;Maintenance of O2 saturations>88%;Exhibits proper breathing techniques, such as pursed lip breathing or other method taught during program session;Compliance with respiratory medication              Oxygen Re-Evaluation:  Oxygen Re-Evaluation     Row Name 08/19/20 1613 09/16/20 1412 10/14/20 1545 11/04/20 1536 12/02/20 1012     Program Oxygen Prescription   Program Oxygen Prescription None None None None None     Home Oxygen   Home Oxygen Device None None None None None   Sleep Oxygen Prescription None None None None None   Home Exercise Oxygen Prescription None None None None None   Home Resting Oxygen Prescription None None None None None   Compliance with Home Oxygen Use Yes Yes Yes Yes Yes     Goals/Expected Outcomes   Short Term Goals To learn and exhibit compliance with exercise, home and travel O2 prescription;To learn and understand importance of monitoring SPO2 with pulse oximeter and demonstrate accurate use of the pulse oximeter.;To learn and understand importance of maintaining oxygen saturations>88%;To learn and demonstrate proper pursed lip breathing techniques or other breathing techniques. ;To learn and demonstrate proper use of respiratory medications To learn and exhibit compliance with exercise, home and travel O2 prescription;To learn and understand importance of monitoring SPO2 with pulse oximeter and demonstrate accurate use of the pulse oximeter.;To learn and understand importance of maintaining oxygen saturations>88%;To learn and demonstrate proper pursed lip breathing techniques or other breathing techniques. ;To learn and demonstrate proper use of respiratory medications To learn and exhibit compliance with exercise, home and travel O2 prescription;To learn and understand importance of monitoring SPO2 with pulse oximeter and demonstrate accurate use of the pulse oximeter.;To learn and understand importance of maintaining oxygen saturations>88%;To learn and demonstrate proper pursed lip breathing techniques or other breathing techniques. ;To learn and demonstrate proper use of respiratory medications To learn and exhibit compliance with exercise, home and  travel O2 prescription;To learn and understand importance of monitoring SPO2 with pulse oximeter and demonstrate accurate use of the pulse oximeter.;To learn and understand importance of maintaining oxygen saturations>88%;To learn and demonstrate proper pursed lip breathing techniques or other breathing techniques. ;To learn and demonstrate proper use of respiratory medications To learn and exhibit compliance with exercise, home and travel O2 prescription;To learn and understand importance of monitoring SPO2 with pulse oximeter and demonstrate accurate use of the pulse oximeter.;To learn and understand importance of maintaining oxygen saturations>88%;To learn and demonstrate proper pursed lip breathing techniques or other breathing techniques. ;To learn and demonstrate proper use of respiratory medications   Long  Term  Goals Exhibits compliance with exercise, home  and travel O2 prescription;Verbalizes importance of monitoring SPO2 with pulse oximeter and return demonstration;Maintenance of O2 saturations>88%;Exhibits proper breathing techniques, such as pursed lip breathing or other method taught during program session;Compliance with respiratory medication Exhibits compliance with exercise, home  and travel O2 prescription;Verbalizes importance of monitoring SPO2 with pulse oximeter and return demonstration;Maintenance of O2 saturations>88%;Exhibits proper breathing techniques, such as pursed lip breathing or other method taught during program session;Compliance with respiratory medication Exhibits compliance with exercise, home  and travel O2 prescription;Verbalizes importance of monitoring SPO2 with pulse oximeter and return demonstration;Maintenance of O2 saturations>88%;Exhibits proper breathing techniques, such as pursed lip breathing or other method taught during program session;Compliance with respiratory medication Exhibits compliance with exercise, home  and travel O2 prescription;Verbalizes importance  of monitoring SPO2 with pulse oximeter and return demonstration;Maintenance of O2 saturations>88%;Exhibits proper breathing techniques, such as pursed lip breathing or other method taught during program session;Compliance with respiratory medication Exhibits compliance with exercise, home  and travel O2 prescription;Verbalizes importance of monitoring SPO2 with pulse oximeter and return demonstration;Maintenance of O2 saturations>88%;Exhibits proper breathing techniques, such as pursed lip breathing or other method taught during program session;Compliance with respiratory medication   Goals/Expected Outcomes compliance compliance -- compliance compliance            Oxygen Discharge (Final Oxygen Re-Evaluation):  Oxygen Re-Evaluation - 12/02/20 1012       Program Oxygen Prescription   Program Oxygen Prescription None      Home Oxygen   Home Oxygen Device None    Sleep Oxygen Prescription None    Home Exercise Oxygen Prescription None    Home Resting Oxygen Prescription None    Compliance with Home Oxygen Use Yes      Goals/Expected Outcomes   Short Term Goals To learn and exhibit compliance with exercise, home and travel O2 prescription;To learn and understand importance of monitoring SPO2 with pulse oximeter and demonstrate accurate use of the pulse oximeter.;To learn and understand importance of maintaining oxygen saturations>88%;To learn and demonstrate proper pursed lip breathing techniques or other breathing techniques. ;To learn and demonstrate proper use of respiratory medications    Long  Term Goals Exhibits compliance with exercise, home  and travel O2 prescription;Verbalizes importance of monitoring SPO2 with pulse oximeter and return demonstration;Maintenance of O2 saturations>88%;Exhibits proper breathing techniques, such as pursed lip breathing or other method taught during program session;Compliance with respiratory medication    Goals/Expected Outcomes compliance              Initial Exercise Prescription:  Initial Exercise Prescription - 08/08/20 1400       Date of Initial Exercise RX and Referring Provider   Date 08/08/20    Referring Provider Dr. Ander Slade    Expected Discharge Date 12/11/20      NuStep   Level 1    SPM 80    Minutes 22      Arm Ergometer   Level 1    RPM 45    Minutes 17      Prescription Details   Frequency (times per week) 2    Duration Progress to 30 minutes of continuous aerobic without signs/symptoms of physical distress      Intensity   THRR 40-80% of Max Heartrate 57-114    Ratings of Perceived Exertion 11-13    Perceived Dyspnea 0-4      Resistance Training   Training Prescription Yes    Weight 3 lbs    Reps 10-15  Perform Capillary Blood Glucose checks as needed.  Exercise Prescription Changes:   Exercise Prescription Changes     Row Name 08/19/20 1600 09/02/20 1400 09/16/20 1400 09/25/20 1400 10/14/20 1500     Response to Exercise   Blood Pressure (Admit) 110/68 112/60 112/68 112/68 108/60   Blood Pressure (Exercise) 122/70 124/64 118/70 128/70 128/76   Blood Pressure (Exit) 104/70 100/52 108/70 104/58 96/56   Heart Rate (Admit) 92 bpm 81 bpm 76 bpm 75 bpm 84 bpm   Heart Rate (Exercise) 93 bpm 95 bpm 87 bpm 87 bpm 92 bpm   Heart Rate (Exit) 87 bpm 85 bpm 78 bpm 82 bpm 81 bpm   Oxygen Saturation (Admit) 95 % 196 % 95 % 96 % 95 %   Oxygen Saturation (Exercise) 93 % 95 % 94 % 96 % 96 %   Oxygen Saturation (Exit) 95 % 95 % 97 % 96 % 95 %   Rating of Perceived Exertion (Exercise) 14 12 11 10 11    Perceived Dyspnea (Exercise) 12 12 11 10 11    Duration Continue with 30 min of aerobic exercise without signs/symptoms of physical distress. Continue with 30 min of aerobic exercise without signs/symptoms of physical distress. Continue with 30 min of aerobic exercise without signs/symptoms of physical distress. Continue with 30 min of aerobic exercise without signs/symptoms of physical distress.  Continue with 30 min of aerobic exercise without signs/symptoms of physical distress.   Intensity THRR unchanged THRR unchanged THRR unchanged THRR unchanged THRR unchanged     Progression   Progression Continue to progress workloads to maintain intensity without signs/symptoms of physical distress. Continue to progress workloads to maintain intensity without signs/symptoms of physical distress. Continue to progress workloads to maintain intensity without signs/symptoms of physical distress. Continue to progress workloads to maintain intensity without signs/symptoms of physical distress. Continue to progress workloads to maintain intensity without signs/symptoms of physical distress.     Resistance Training   Training Prescription Yes Yes Yes Yes Yes   Weight 3 lbs 4 lbs 4 lbs 4 lbs 4 lbs   Reps 10-15 10-15 10-15 10-15 10-15   Time -- 10 Minutes 10 Minutes 10 Minutes 10 Minutes     NuStep   Level 1 1 2 2 2    SPM 84 89 84 94 90   Minutes 22 22 22 22 22    METs 2 2.1 2 2.1 2.1     Arm Ergometer   Level 1 1.5 1.5 1.9 1.5   RPM 53 52 57 24 59   Minutes 17 17 17 17 17    METs 1.8 1.8 2.1 2 2.1    Row Name 10/21/20 1600 11/04/20 1500 11/18/20 1500 11/27/20 1012       Response to Exercise   Blood Pressure (Admit) -- 98/62 118/60 110/64    Blood Pressure (Exercise) -- 114/76 124/68 138/78    Blood Pressure (Exit) -- 110/70 110/52 124/80    Heart Rate (Admit) -- 72 bpm 76 bpm 71 bpm    Heart Rate (Exercise) -- 82 bpm 93 bpm 87 bpm    Heart Rate (Exit) -- 79 bpm 84 bpm 76 bpm    Oxygen Saturation (Admit) -- 96 % 96 % 94 %    Oxygen Saturation (Exercise) -- 95 % 96 % 95 %    Oxygen Saturation (Exit) -- 96 % 98 % 96 %    Rating of Perceived Exertion (Exercise) -- 11 12 12     Perceived Dyspnea (Exercise) -- 11 12 12  Duration -- Continue with 30 min of aerobic exercise without signs/symptoms of physical distress. Continue with 30 min of aerobic exercise without signs/symptoms of physical  distress. Continue with 30 min of aerobic exercise without signs/symptoms of physical distress.    Intensity -- THRR unchanged THRR unchanged THRR unchanged         Progression        Progression -- Continue to progress workloads to maintain intensity without signs/symptoms of physical distress. Continue to progress workloads to maintain intensity without signs/symptoms of physical distress. Continue to progress workloads to maintain intensity without signs/symptoms of physical distress.         Resistance Training        Training Prescription -- Yes Yes Yes    Weight -- 4 lbs 4 lbs 4 lbs    Reps -- 10-15 10-15 10-15    Time -- 10 Minutes 10 Minutes 10 Minutes         NuStep        Level -- 2 3 3     SPM -- 85 79 82    Minutes -- 22 22 22     METs -- 2.1 2.1 2.1         Arm Ergometer        Level -- 2 2.5 2    RPM -- 57 60 56    Minutes -- 17 17 17     METs -- 2.4 2.4 2.2         Home Exercise Plan        Plans to continue exercise at Home (comment) -- -- --    Frequency Add 2 additional days to program exercise sessions. -- -- --    Initial Home Exercises Provided 10/21/20 -- -- --            Exercise Comments:   Exercise Comments     Row Name 10/21/20 1603           Exercise Comments Patient and I reviewed home exercise program. He is not currenlty exercising at home. He wants to get back into exercising and says he has "falling off" the last couple of years with his health. He and I agreed on starting at 20 minutes 2 extra days per week. He is also going to engage in resistance training. Will follow up and progress as able.                Exercise Goals and Review:   Exercise Goals     Row Name 08/08/20 1432 08/19/20 1616 09/16/20 1412 10/14/20 1542 11/04/20 1539     Exercise Goals   Increase Physical Activity Yes Yes Yes Yes Yes   Intervention Provide advice, education, support and counseling about physical activity/exercise needs.;Develop an  individualized exercise prescription for aerobic and resistive training based on initial evaluation findings, risk stratification, comorbidities and participant's personal goals. Provide advice, education, support and counseling about physical activity/exercise needs.;Develop an individualized exercise prescription for aerobic and resistive training based on initial evaluation findings, risk stratification, comorbidities and participant's personal goals. Provide advice, education, support and counseling about physical activity/exercise needs.;Develop an individualized exercise prescription for aerobic and resistive training based on initial evaluation findings, risk stratification, comorbidities and participant's personal goals. Provide advice, education, support and counseling about physical activity/exercise needs.;Develop an individualized exercise prescription for aerobic and resistive training based on initial evaluation findings, risk stratification, comorbidities and participant's personal goals. Provide advice, education, support and counseling about physical activity/exercise needs.;Develop an individualized exercise  prescription for aerobic and resistive training based on initial evaluation findings, risk stratification, comorbidities and participant's personal goals.   Expected Outcomes Short Term: Attend rehab on a regular basis to increase amount of physical activity.;Long Term: Add in home exercise to make exercise part of routine and to increase amount of physical activity.;Long Term: Exercising regularly at least 3-5 days a week. Short Term: Attend rehab on a regular basis to increase amount of physical activity.;Long Term: Add in home exercise to make exercise part of routine and to increase amount of physical activity.;Long Term: Exercising regularly at least 3-5 days a week. Short Term: Attend rehab on a regular basis to increase amount of physical activity.;Long Term: Add in home exercise to  make exercise part of routine and to increase amount of physical activity.;Long Term: Exercising regularly at least 3-5 days a week. Short Term: Attend rehab on a regular basis to increase amount of physical activity.;Long Term: Add in home exercise to make exercise part of routine and to increase amount of physical activity.;Long Term: Exercising regularly at least 3-5 days a week. Short Term: Attend rehab on a regular basis to increase amount of physical activity.;Long Term: Add in home exercise to make exercise part of routine and to increase amount of physical activity.;Long Term: Exercising regularly at least 3-5 days a week.   Increase Strength and Stamina Yes Yes Yes Yes Yes   Intervention Provide advice, education, support and counseling about physical activity/exercise needs.;Develop an individualized exercise prescription for aerobic and resistive training based on initial evaluation findings, risk stratification, comorbidities and participant's personal goals. Provide advice, education, support and counseling about physical activity/exercise needs.;Develop an individualized exercise prescription for aerobic and resistive training based on initial evaluation findings, risk stratification, comorbidities and participant's personal goals. Provide advice, education, support and counseling about physical activity/exercise needs.;Develop an individualized exercise prescription for aerobic and resistive training based on initial evaluation findings, risk stratification, comorbidities and participant's personal goals. Provide advice, education, support and counseling about physical activity/exercise needs.;Develop an individualized exercise prescription for aerobic and resistive training based on initial evaluation findings, risk stratification, comorbidities and participant's personal goals. Provide advice, education, support and counseling about physical activity/exercise needs.;Develop an individualized  exercise prescription for aerobic and resistive training based on initial evaluation findings, risk stratification, comorbidities and participant's personal goals.   Expected Outcomes Short Term: Increase workloads from initial exercise prescription for resistance, speed, and METs.;Short Term: Perform resistance training exercises routinely during rehab and add in resistance training at home;Long Term: Improve cardiorespiratory fitness, muscular endurance and strength as measured by increased METs and functional capacity (6MWT) Short Term: Increase workloads from initial exercise prescription for resistance, speed, and METs.;Short Term: Perform resistance training exercises routinely during rehab and add in resistance training at home;Long Term: Improve cardiorespiratory fitness, muscular endurance and strength as measured by increased METs and functional capacity (6MWT) Short Term: Increase workloads from initial exercise prescription for resistance, speed, and METs.;Short Term: Perform resistance training exercises routinely during rehab and add in resistance training at home;Long Term: Improve cardiorespiratory fitness, muscular endurance and strength as measured by increased METs and functional capacity (6MWT) Short Term: Increase workloads from initial exercise prescription for resistance, speed, and METs.;Short Term: Perform resistance training exercises routinely during rehab and add in resistance training at home;Long Term: Improve cardiorespiratory fitness, muscular endurance and strength as measured by increased METs and functional capacity (6MWT) Short Term: Increase workloads from initial exercise prescription for resistance, speed, and METs.;Short Term: Perform resistance training exercises  routinely during rehab and add in resistance training at home;Long Term: Improve cardiorespiratory fitness, muscular endurance and strength as measured by increased METs and functional capacity (6MWT)   Able to  understand and use rate of perceived exertion (RPE) scale Yes Yes Yes Yes Yes   Intervention Provide education and explanation on how to use RPE scale Provide education and explanation on how to use RPE scale Provide education and explanation on how to use RPE scale Provide education and explanation on how to use RPE scale Provide education and explanation on how to use RPE scale   Expected Outcomes Short Term: Able to use RPE daily in rehab to express subjective intensity level;Long Term:  Able to use RPE to guide intensity level when exercising independently Short Term: Able to use RPE daily in rehab to express subjective intensity level;Long Term:  Able to use RPE to guide intensity level when exercising independently Short Term: Able to use RPE daily in rehab to express subjective intensity level;Long Term:  Able to use RPE to guide intensity level when exercising independently Short Term: Able to use RPE daily in rehab to express subjective intensity level;Long Term:  Able to use RPE to guide intensity level when exercising independently Short Term: Able to use RPE daily in rehab to express subjective intensity level;Long Term:  Able to use RPE to guide intensity level when exercising independently   Able to understand and use Dyspnea scale Yes Yes Yes Yes Yes   Intervention Provide education and explanation on how to use Dyspnea scale Provide education and explanation on how to use Dyspnea scale Provide education and explanation on how to use Dyspnea scale Provide education and explanation on how to use Dyspnea scale Provide education and explanation on how to use Dyspnea scale   Expected Outcomes Short Term: Able to use Dyspnea scale daily in rehab to express subjective sense of shortness of breath during exertion;Long Term: Able to use Dyspnea scale to guide intensity level when exercising independently Short Term: Able to use Dyspnea scale daily in rehab to express subjective sense of shortness of  breath during exertion;Long Term: Able to use Dyspnea scale to guide intensity level when exercising independently Short Term: Able to use Dyspnea scale daily in rehab to express subjective sense of shortness of breath during exertion;Long Term: Able to use Dyspnea scale to guide intensity level when exercising independently Short Term: Able to use Dyspnea scale daily in rehab to express subjective sense of shortness of breath during exertion;Long Term: Able to use Dyspnea scale to guide intensity level when exercising independently Short Term: Able to use Dyspnea scale daily in rehab to express subjective sense of shortness of breath during exertion;Long Term: Able to use Dyspnea scale to guide intensity level when exercising independently   Knowledge and understanding of Target Heart Rate Range (THRR) Yes Yes Yes Yes Yes   Intervention Provide education and explanation of THRR including how the numbers were predicted and where they are located for reference Provide education and explanation of THRR including how the numbers were predicted and where they are located for reference Provide education and explanation of THRR including how the numbers were predicted and where they are located for reference Provide education and explanation of THRR including how the numbers were predicted and where they are located for reference Provide education and explanation of THRR including how the numbers were predicted and where they are located for reference   Expected Outcomes Short Term: Able to state/look up THRR;Long  Term: Able to use THRR to govern intensity when exercising independently;Short Term: Able to use daily as guideline for intensity in rehab Short Term: Able to state/look up THRR;Long Term: Able to use THRR to govern intensity when exercising independently;Short Term: Able to use daily as guideline for intensity in rehab Short Term: Able to state/look up THRR;Long Term: Able to use THRR to govern intensity  when exercising independently;Short Term: Able to use daily as guideline for intensity in rehab Short Term: Able to state/look up THRR;Long Term: Able to use THRR to govern intensity when exercising independently;Short Term: Able to use daily as guideline for intensity in rehab Short Term: Able to state/look up THRR;Long Term: Able to use THRR to govern intensity when exercising independently;Short Term: Able to use daily as guideline for intensity in rehab   Understanding of Exercise Prescription Yes Yes Yes Yes Yes   Intervention Provide education, explanation, and written materials on patient's individual exercise prescription Provide education, explanation, and written materials on patient's individual exercise prescription Provide education, explanation, and written materials on patient's individual exercise prescription Provide education, explanation, and written materials on patient's individual exercise prescription Provide education, explanation, and written materials on patient's individual exercise prescription   Expected Outcomes Short Term: Able to explain program exercise prescription;Long Term: Able to explain home exercise prescription to exercise independently Short Term: Able to explain program exercise prescription;Long Term: Able to explain home exercise prescription to exercise independently Short Term: Able to explain program exercise prescription;Long Term: Able to explain home exercise prescription to exercise independently Short Term: Able to explain program exercise prescription;Long Term: Able to explain home exercise prescription to exercise independently Short Term: Able to explain program exercise prescription;Long Term: Able to explain home exercise prescription to exercise independently    [place] Name 12/02/20 1014             Exercise Goals   Increase Physical Activity Yes       Intervention Provide advice, education, support and counseling about physical activity/exercise  needs.;Develop an individualized exercise prescription for aerobic and resistive training based on initial evaluation findings, risk stratification, comorbidities and participant's personal goals.       Expected Outcomes Short Term: Attend rehab on a regular basis to increase amount of physical activity.;Long Term: Add in home exercise to make exercise part of routine and to increase amount of physical activity.;Long Term: Exercising regularly at least 3-5 days a week.       Increase Strength and Stamina Yes       Intervention Provide advice, education, support and counseling about physical activity/exercise needs.;Develop an individualized exercise prescription for aerobic and resistive training based on initial evaluation findings, risk stratification, comorbidities and participant's personal goals.       Expected Outcomes Short Term: Increase workloads from initial exercise prescription for resistance, speed, and METs.;Short Term: Perform resistance training exercises routinely during rehab and add in resistance training at home;Long Term: Improve cardiorespiratory fitness, muscular endurance and strength as measured by increased METs and functional capacity (6MWT)       Able to understand and use rate of perceived exertion (RPE) scale Yes       Intervention Provide education and explanation on how to use RPE scale       Expected Outcomes Short Term: Able to use RPE daily in rehab to express subjective intensity level;Long Term:  Able to use RPE to guide intensity level when exercising independently       Able to understand and use  Dyspnea scale Yes       Intervention Provide education and explanation on how to use Dyspnea scale       Expected Outcomes Short Term: Able to use Dyspnea scale daily in rehab to express subjective sense of shortness of breath during exertion;Long Term: Able to use Dyspnea scale to guide intensity level when exercising independently       Knowledge and understanding of  Target Heart Rate Range (THRR) Yes       Intervention Provide education and explanation of THRR including how the numbers were predicted and where they are located for reference       Expected Outcomes Short Term: Able to state/look up THRR;Long Term: Able to use THRR to govern intensity when exercising independently;Short Term: Able to use daily as guideline for intensity in rehab       Understanding of Exercise Prescription Yes       Intervention Provide education, explanation, and written materials on patient's individual exercise prescription       Expected Outcomes Short Term: Able to explain program exercise prescription;Long Term: Able to explain home exercise prescription to exercise independently                Exercise Goals Re-Evaluation :  Exercise Goals Re-Evaluation     [place] Name 08/19/20 1616 09/16/20 1419 10/14/20 1542 11/04/20 1539 12/02/20 1014     Exercise Goal Re-Evaluation   Exercise Goals Review Increase Physical Activity;Increase Strength and Stamina;Able to understand and use rate of perceived exertion (RPE) scale;Able to understand and use Dyspnea scale;Knowledge and understanding of Target Heart Rate Range (THRR);Understanding of Exercise Prescription Increase Physical Activity;Increase Strength and Stamina;Able to understand and use rate of perceived exertion (RPE) scale;Able to understand and use Dyspnea scale;Knowledge and understanding of Target Heart Rate Range (THRR);Understanding of Exercise Prescription Increase Physical Activity;Increase Strength and Stamina;Able to understand and use rate of perceived exertion (RPE) scale;Able to understand and use Dyspnea scale;Knowledge and understanding of Target Heart Rate Range (THRR);Understanding of Exercise Prescription Increase Physical Activity;Increase Strength and Stamina;Able to understand and use rate of perceived exertion (RPE) scale;Able to understand and use Dyspnea scale;Knowledge and understanding of Target  Heart Rate Range (THRR);Understanding of Exercise Prescription Increase Physical Activity;Increase Strength and Stamina;Able to understand and use rate of perceived exertion (RPE) scale;Able to understand and use Dyspnea scale;Knowledge and understanding of Target Heart Rate Range (THRR);Understanding of Exercise Prescription   Comments Pt has attended 3 rehab sessions. He has been able to tolerate exercise well and should progress well through the program. He currently exercises at 2.0 METs on the stepper. Will continue to monitor and progress as able. Patient has completed 11 exercise sessions. He has tolerated exercise well with no complaints. He is making progress in the program and is enjoying the program. He is very interested in tracking his progress and learning how we increase his intensities. He is very positive to have in class and is very interactive with the staff and others in his class. He is currently exercising at 2.0 METs on the NuStep. Will continue to monitor and progress as able. patient has completed 15 exercise sessions. He has tolerated exercise well and is progressing slowly. He is very eager to progress and eager to learn about hisprogress. He asks questions frequently. He is very positive in class and is very interactive with the staff and others in his class. he is currently exercising at 2.1 METs on the Nustep. Will continue to monitor and progress as able.  Pt has completed 21 exercise sessions. He continues to progress and has began to increase his workloads. He reports that he can tell that he is getting stronger. He is currently exercising at 2.1 METs on the stepper. Will continue to monitor and progress as able. Pt has completed 26 exercise sessions. He will graduate from the program next week, so I will discuss with him how he will continue his exercise. He has benefited from the program and reports that he has gotten stronger. He is currently exercising at 2.1 METs on the stepper.  Will continue to monitor and progress as able.   Expected Outcomes Through exercise at rehab and through engaging in a home exercise program, the patient will meet their stated goals. Through exercise at rehab and through engaging in a home exercise program, the patient will meet their stated goals. Through exercise at rehab and through engaging in a home exercise program, the patient will meet their stated goals. Through exercise at rehab and through engaging in a home exercise program, the patient will meet their stated goals. Through exercise at rehab and through engaging in a home exercise program, the patient will meet their stated goals.            Discharge Exercise Prescription (Final Exercise Prescription Changes):  Exercise Prescription Changes - 11/27/20 1012       Response to Exercise   Blood Pressure (Admit) 110/64    Blood Pressure (Exercise) 138/78    Blood Pressure (Exit) 124/80    Heart Rate (Admit) 71 bpm    Heart Rate (Exercise) 87 bpm    Heart Rate (Exit) 76 bpm    Oxygen Saturation (Admit) 94 %    Oxygen Saturation (Exercise) 95 %    Oxygen Saturation (Exit) 96 %    Rating of Perceived Exertion (Exercise) 12    Perceived Dyspnea (Exercise) 12    Duration Continue with 30 min of aerobic exercise without signs/symptoms of physical distress.    Intensity THRR unchanged      Progression   Progression Continue to progress workloads to maintain intensity without signs/symptoms of physical distress.      Resistance Training   Training Prescription Yes    Weight 4 lbs    Reps 10-15    Time 10 Minutes      NuStep   Level 3    SPM 82    Minutes 22    METs 2.1      Arm Ergometer   Level 2    RPM 56    Minutes 17    METs 2.2             Nutrition:  Target Goals: Understanding of nutrition guidelines, daily intake of sodium 1500mg , cholesterol 200mg , calories 30% from fat and 7% or less from saturated fats, daily to have 5 or more servings of fruits  and vegetables.  Biometrics:  Pre Biometrics - 10/14/20 1545       Pre Biometrics   Weight 90.6 kg    BMI (Calculated) 26.36              Nutrition Therapy Plan and Nutrition Goals:  Nutrition Therapy & Goals - 11/25/20 0912       Personal Nutrition Goals   Comments We will continue to provide nutritional education and monitor.      Intervention Plan   Intervention Nutrition handout(s) given to patient.             Nutrition Assessments:  Nutrition  Assessments - 08/08/20 1256       MEDFICTS Scores   Post Score 84            MEDIFICTS Score Key: ?70 Need to make dietary changes  40-70 Heart Healthy Diet ? 40 Therapeutic Level Cholesterol Diet   Picture Your Plate Scores: <60 Unhealthy dietary pattern with much room for improvement. 41-50 Dietary pattern unlikely to meet recommendations for good health and room for improvement. 51-60 More healthful dietary pattern, with some room for improvement.  >60 Healthy dietary pattern, although there may be some specific behaviors that could be improved.    Nutrition Goals Re-Evaluation:   Nutrition Goals Discharge (Final Nutrition Goals Re-Evaluation):   Psychosocial: Target Goals: Acknowledge presence or absence of significant depression and/or stress, maximize coping skills, provide positive support system. Participant is able to verbalize types and ability to use techniques and skills needed for reducing stress and depression.  Initial Review & Psychosocial Screening:  Initial Psych Review & Screening - 08/08/20 1255       Initial Review   Current issues with Current Stress Concerns    Source of Stress Concerns Family    Comments Grieving the death of his wife.      Family Dynamics   Good Support System? Yes    Comments His two children and his grandchildren are his support system.      Barriers   Psychosocial barriers to participate in program The patient should benefit from training in stress  management and relaxation.      Screening Interventions   Interventions Encouraged to exercise;Provide feedback about the scores to participant;To provide support and resources with identified psychosocial needs    Expected Outcomes Long Term Goal: Stressors or current issues are controlled or eliminated.;Short Term goal: Identification and review with participant of any Quality of Life or Depression concerns found by scoring the questionnaire.;Long Term goal: The participant improves quality of Life and PHQ9 Scores as seen by post scores and/or verbalization of changes             Quality of Life Scores:  Quality of Life - 08/08/20 1435       Quality of Life   Select Quality of Life      Quality of Life Scores   Health/Function Pre 9.34 %    Socioeconomic Pre 16.4 %    Psych/Spiritual Pre 15.08 %    Family Pre 23.63 %    GLOBAL Pre 13.44 %            Scores of 19 and below usually indicate a poorer quality of life in these areas.  A difference of  2-3 points is a clinically meaningful difference.  A difference of 2-3 points in the total score of the Quality of Life Index has been associated with significant improvement in overall quality of life, self-image, physical symptoms, and general health in studies assessing change in quality of life.   PHQ-9: Recent Review Flowsheet Data     Depression screen [facility] 2/9 10/23/2020 08/08/2020 06/25/2020 02/13/2019   Decreased Interest 0 3  0 0   Down, Depressed, Hopeless 0 0 0 0   PHQ - 2 Score 0 3 0 0   Altered sleeping - 0 - -   Tired, decreased energy - 3 - -   Change in appetite - 0 - -   Feeling bad or failure about yourself  - 0 - -   Trouble concentrating - 0 - -   Moving  slowly or fidgety/restless - 0 - -   Suicidal thoughts - 0 - -   PHQ-9 Score - 6 - -   Difficult doing work/chores - Somewhat difficult - -      Interpretation of Total Score  Total Score Depression Severity:  1-4 = Minimal depression, 5-9 = Mild  depression, 10-14 = Moderate depression, 15-19 = Moderately severe depression, 20-27 = Severe depression   Psychosocial Evaluation and Intervention:  Psychosocial Evaluation - 08/08/20 1438       Psychosocial Evaluation & Interventions   Interventions Stress management education;Relaxation education;Encouraged to exercise with the program and follow exercise prescription    Comments Pt has no barriers to participating in pulmonary rehab. He is still grieving the death of his wife who died 3 years ago. They were married for 58 years. He reports that he no longer enjoys doing a lot of the things that he used to due to his sadness. He stated that he tried to attend some grief support groups at first, but he states he thought that they were not beneficial at all. He was defensive when talking about the subject of his wife's death and when I asked if he needed to talk to a councelor. He states that he will cope on his own, but I do think he would benefit from professional support. He reports that he has a good support system now with his 2 children and his grandchildren. A lot of his other problems stem from his wife's death. He reports that she did all of the cooking and he had never learned to cook, so now his diet has gotten unhealthy. Additionally, he states that he let himself go physically after her death. This has made the SOB from his COPD even worse. He scored a 6 on his PHQ-9 and a 13.44 on his QOL. His decreased QOL is associated with his grieving and his health. He does have optimism that the pulmonary rehab program will better his health and help him breath better. He will benefit from stress management education, relaxation education, and overall social support from staff and other patients.    Expected Outcomes Through education and exercise, the patient's stressors will be reduced.    Continue Psychosocial Services  Follow up required by staff             Psychosocial Re-Evaluation:   Psychosocial Re-Evaluation     [place] Name 08/13/20 1343 09/08/20 1408 10/08/20 1246 10/28/20 1525 11/25/20 0908     Psychosocial Re-Evaluation   Current issues with None Identified None Identified None Identified None Identified --   Comments Patient is new in program . His initial QOL score is 13.44 and his PHQ-9 was 6. He seems to enjoy coming and interacting with other group members and staff. He was referred to pulmonary rehav by Dr. Ander Slade with a diagnosis of COPDwith acute exacerbation.  His personal goals for the program are to improve SOB, lose weight and get stronger.  We will continue to monitor as he works toward meeting these goals. Patient has completed 8 sessions.   He seems to enjoy coming to class and interacting with other group members and staff.  His personal goals for the program are to improve SOB, lose weight and get stronger.  We will continue to monitor as he works toward meeting these goals.  He is doing well in the program with progression and consistent attendance. Patient has completed 14 sessions.  He continues to have no psychosocial issues identified.  He continues to enjoy coming to class and interacting with other group members and staff. We will continue to monitor. Patient has completed 19 sessions.  He continues to have no psychosocial issues identified. Talks about his wife and how much he misses her. Continues to grieve some for her. He continues to enjoy coming to class and interacting with other group members and staff. We will continue to monitor. Patient has completed 19 sessions.  He continues to have no psychosocial issues identified. Talks about his wife and how much he misses her. Continues to grieve some for her. He continues to enjoy coming to class and interacting with other group members and staff. We will continue to monitor.   Expected Outcomes Patient will have no psychosocial barriers identified at discharge. Patient will have no psychosocial barriers  identified at discharge. Patient will have no psychosocial barriers identified at discharge. Patient will have no psychosocial barriers identified at discharge. Patient will have no psychosocial barriers identified at discharge.   Interventions Stress management education;Relaxation education;Encouraged to attend Pulmonary Rehabilitation for the exercise Stress management education;Relaxation education;Encouraged to attend Pulmonary Rehabilitation for the exercise Stress management education;Relaxation education;Encouraged to attend Pulmonary Rehabilitation for the exercise Stress management education;Relaxation education;Encouraged to attend Pulmonary Rehabilitation for the exercise Stress management education;Relaxation education;Encouraged to attend Pulmonary Rehabilitation for the exercise   Continue Psychosocial Services  No Follow up required No Follow up required No Follow up required No Follow up required No Follow up required            Psychosocial Discharge (Final Psychosocial Re-Evaluation):  Psychosocial Re-Evaluation - 11/25/20 0908       Psychosocial Re-Evaluation   Comments Patient has completed 19 sessions.  He continues to have no psychosocial issues identified. Talks about his wife and how much he misses her. Continues to grieve some for her. He continues to enjoy coming to class and interacting with other group members and staff. We will continue to monitor.    Expected Outcomes Patient will have no psychosocial barriers identified at discharge.    Interventions Stress management education;Relaxation education;Encouraged to attend Pulmonary Rehabilitation for the exercise    Continue Psychosocial Services  No Follow up required              Education: Education Goals: Education classes will be provided on a weekly basis, covering required topics. Participant will state understanding/return demonstration of topics presented.  Learning Barriers/Preferences:  Learning  Barriers/Preferences - 08/08/20 1304       Learning Barriers/Preferences   Learning Barriers Hearing    Learning Preferences Written Material;Audio;Skilled Demonstration             Education Topics: How Lungs Work and Diseases: - Discuss the anatomy of the lungs and diseases that can affect the lungs, such as COPD. Flowsheet Row PULMONARY REHAB CHRONIC OBSTRUCTIVE PULMONARY DISEASE from 11/27/2020 in [place]  Date 08/21/20  Educator DJ  Instruction Review Code 1- Verbalizes Understanding       Exercise: -Discuss the importance of exercise, FITT principles of exercise, normal and abnormal responses to exercise, and how to exercise safely.   Environmental Irritants: -Discuss types of environmental irritants and how to limit exposure to environmental irritants. Flowsheet Row PULMONARY REHAB CHRONIC OBSTRUCTIVE PULMONARY DISEASE from 11/27/2020 in [place]  Date 11/27/20  Educator DF  Instruction Review Code 2- Demonstrated Understanding       Meds/Inhalers and oxygen: - Discuss respiratory medications, definition of an inhaler and oxygen, and  the proper way to use an inhaler and oxygen. Flowsheet Row PULMONARY REHAB CHRONIC OBSTRUCTIVE PULMONARY DISEASE from 11/27/2020 in [place]  Date 09/04/20  Educator [facility]       Energy Saving Techniques: - Discuss methods to conserve energy and decrease shortness of breath when performing activities of daily living.  Flowsheet Row PULMONARY REHAB CHRONIC OBSTRUCTIVE PULMONARY DISEASE from 11/27/2020 in [place]  Date 09/11/20  Educator mk  Instruction Review Code 2- Demonstrated Understanding       Bronchial Hygiene / Breathing Techniques: - Discuss breathing mechanics, pursed-lip breathing technique,  proper posture, effective ways to clear airways, and other functional breathing techniques   Cleaning Equipment: - Provides  group verbal and written instruction about the health risks of elevated stress, cause of high stress, and healthy ways to reduce stress. Flowsheet Row PULMONARY REHAB CHRONIC OBSTRUCTIVE PULMONARY DISEASE from 11/27/2020 in [place]  Date 09/25/20  Educator pb  Instruction Review Code 2- Demonstrated Understanding       Nutrition I: Fats: - Discuss the types of cholesterol, what cholesterol does to the body, and how cholesterol levels can be controlled.   Nutrition II: Labels: -Discuss the different components of food labels and how to read food labels.   Respiratory Infections: - Discuss the signs and symptoms of respiratory infections, ways to prevent respiratory infections, and the importance of seeking medical treatment when having a respiratory infection. Flowsheet Row PULMONARY REHAB CHRONIC OBSTRUCTIVE PULMONARY DISEASE from 11/27/2020 in [place]  Date 10/16/20  Educator PB  Instruction Review Code 1- Verbalizes Understanding       Stress I: Signs and Symptoms: - Discuss the causes of stress, how stress may lead to anxiety and depression, and ways to limit stress. Flowsheet Row PULMONARY REHAB CHRONIC OBSTRUCTIVE PULMONARY DISEASE from 11/27/2020 in [place]  Date 10/23/20  Educator mk  Instruction Review Code 2- Demonstrated Understanding       Stress II: Relaxation: -Discuss relaxation techniques to limit stress. Flowsheet Row PULMONARY REHAB CHRONIC OBSTRUCTIVE PULMONARY DISEASE from 11/27/2020 in [place]  Date 10/30/20  Educator DJ  Instruction Review Code 1- Verbalizes Understanding       Oxygen for Home/Travel: - Discuss how to prepare for travel when on oxygen and proper ways to transport and store oxygen to ensure safety.   Knowledge Questionnaire Score:  Knowledge Questionnaire Score - 08/08/20 1304       Knowledge Questionnaire Score   Pre Score  14/18             Core Components/Risk Factors/Patient Goals at Admission:  Personal Goals and Risk Factors at Admission - 08/08/20 1304       Core Components/Risk Factors/Patient Goals on Admission    Weight Management Yes;Weight Loss    Intervention Weight Management: Develop a combined nutrition and exercise program designed to reach desired caloric intake, while maintaining appropriate intake of nutrient and fiber, sodium and fats, and appropriate energy expenditure required for the weight goal.;Weight Management: Provide education and appropriate resources to help participant work on and attain dietary goals.;Weight Management/Obesity: Establish reasonable short term and long term weight goals.;Obesity: Provide education and appropriate resources to help participant work on and attain dietary goals.    Expected Outcomes Short Term: Continue to assess and modify interventions until short term weight is achieved;Long Term: Adherence to nutrition and physical activity/exercise program aimed toward attainment of established weight goal;Weight Loss: Understanding of general recommendations  for a balanced deficit meal plan, which promotes 1-2 lb weight loss per week and includes a negative energy balance of [phone] kcal/d;Understanding recommendations for meals to include 15-35% energy as protein, 25-35% energy from fat, 35-60% energy from carbohydrates, less than 200mg  of dietary cholesterol, 20-35 gm of total fiber daily;Understanding of distribution of calorie intake throughout the day with the consumption of 4-5 meals/snacks    Improve shortness of breath with ADL's Yes    Intervention Provide education, individualized exercise plan and daily activity instruction to help decrease symptoms of SOB with activities of daily living.    Expected Outcomes Short Term: Improve cardiorespiratory fitness to achieve a reduction of symptoms when performing ADLs;Long Term: Be able to perform more ADLs  without symptoms or delay the onset of symptoms    Stress Yes    Intervention Offer individual and/or small group education and counseling on adjustment to heart disease, stress management and health-related lifestyle change. Teach and support self-help strategies.;Refer participants experiencing significant psychosocial distress to appropriate mental health specialists for further evaluation and treatment. When possible, include family members and significant others in education/counseling sessions.    Expected Outcomes Long Term: Emotional wellbeing is indicated by absence of clinically significant psychosocial distress or social isolation.;Short Term: Participant demonstrates changes in health-related behavior, relaxation and other stress management skills, ability to obtain effective social support, and compliance with psychotropic medications if prescribed.             Core Components/Risk Factors/Patient Goals Review:   Goals and Risk Factor Review     Row Name 08/13/20 1357 09/08/20 1415 10/08/20 1248 10/28/20 1529 11/25/20 0913     Core Components/Risk Factors/Patient Goals Review   Personal Goals Review Improve shortness of breath with ADL's Improve shortness of breath with ADL's Improve shortness of breath with ADL's Improve shortness of breath with ADL's Improve shortness of breath with ADL's   Review Patient is new in the program.  His O2 sats was good on room air at 95-96% while exercising.  He seems to have a positive outlook and very interative with others in class.  His personal goals are to get stronger and have more energy and increase ADL's.  We will continue to monitor as he works toward meeting these goals. Patient has completed 8 sessions.  His O2 sats was good on room air at 93-96% while exercising. Had appointment with Dr. Luna Glasgow on 08-21-20. He continues to have pain in left shoulder. no changes made in medication.  Continue on current medication regimen. He seems to have a  positive outlook and very interative with others in class.  His personal goals are to get stronger and have more energy and increase ADL's.  We will continue to monitor as he works toward meeting these goals. Patient has completed 14 sessions gaining 1 lb since last 30 day review. He continue to do well in the program with consistent attendance and progressions. His blood pressure is well controlled and his O2 saturations are averaging 96% on RA while exercising. He saw his pulmonologist 5/5 for a routine check. No changes made. Patient told MD that PR was benefiting him. He continue sto have pain in his left shoulder and plans to start PT for this after he completes PR. His personal goals for the program are to lose weight; have less SOB; and get stronger. We will continue to monitor as he works toaward meeting these goals. Patient has completed 19 sessions.  Weight remains same for the past  4 weeks at 90.6 kg. He continue to do well in the program with consistent attendance and progressions. His blood pressure is well controlled and his O2 saturations are averaging between 94-96% on RA while exercising on the nu-step.  He continue to have pain in his left shoulder and plans to start PT for this after he completes PR. His personal goals for the program are to lose weight; have less SOB; and get stronger. We will continue to monitor as he works toaward meeting these goals. Patient has completed 25 sessions. Continues to maintain and controll  weight at 89.9 kg. He continue to do well in the program with consistent attendance and progressions. His blood pressure is well controlled and his O2 saturations are averaging between 95%-96% on RA while exercising on the nu-step.  Plans to have CT of Abdominal and pelvis, doctor continues to monitor AAA .  He continue to have pain in his left shoulder and plans to start PT for this after he completes PR. His personal goals for the program are to lose weight; have less SOB;  and get stronger. We will continue to monitor as he works toaward meeting these goals.   Expected Outcomes Patient will complete the program meeting both program and personal goals. Patient will complete the program meeting both program and personal goals. Patient will complete the program meeting both program and personal goals. Patient will complete the program meeting both program and personal goals. Patient will complete the program meeting both program and personal goals.            Core Components/Risk Factors/Patient Goals at Discharge (Final Review):   Goals and Risk Factor Review - 11/25/20 0913       Core Components/Risk Factors/Patient Goals Review   Personal Goals Review Improve shortness of breath with ADL's    Review Patient has completed 25 sessions. Continues to maintain and controll  weight at 89.9 kg. He continue to do well in the program with consistent attendance and progressions. His blood pressure is well controlled and his O2 saturations are averaging between 95%-96% on RA while exercising on the nu-step.  Plans to have CT of Abdominal and pelvis, doctor continues to monitor AAA .  He continue to have pain in his left shoulder and plans to start PT for this after he completes PR. His personal goals for the program are to lose weight; have less SOB; and get stronger. We will continue to monitor as he works toaward meeting these goals.    Expected Outcomes Patient will complete the program meeting both program and personal goals.             ITP Comments:   Comments: ITP REVIEW Pt is making expected progress toward pulmonary rehab goals after completing 27 sessions. Recommend continued exercise, life style modification, education, and utilization of breathing techniques to increase stamina and strength and decrease shortness of breath with exertion.

## 2020-12-04 ENCOUNTER — Encounter (HOSPITAL_COMMUNITY): Payer: Medicare Other

## 2020-12-04 DIAGNOSIS — L718 Other rosacea: Secondary | ICD-10-CM | POA: Diagnosis not present

## 2020-12-04 DIAGNOSIS — L570 Actinic keratosis: Secondary | ICD-10-CM | POA: Diagnosis not present

## 2020-12-04 DIAGNOSIS — S50312A Abrasion of left elbow, initial encounter: Secondary | ICD-10-CM | POA: Diagnosis not present

## 2020-12-04 DIAGNOSIS — X32XXXA Exposure to sunlight, initial encounter: Secondary | ICD-10-CM | POA: Diagnosis not present

## 2020-12-09 ENCOUNTER — Encounter (HOSPITAL_COMMUNITY): Payer: Medicare Other

## 2020-12-10 ENCOUNTER — Ambulatory Visit (INDEPENDENT_AMBULATORY_CARE_PROVIDER_SITE_OTHER): Payer: Medicare Other | Admitting: Vascular Surgery

## 2020-12-10 ENCOUNTER — Other Ambulatory Visit: Payer: Self-pay

## 2020-12-10 ENCOUNTER — Encounter: Payer: Self-pay | Admitting: Vascular Surgery

## 2020-12-10 VITALS — BP 103/70 | HR 81 | Temp 98.2°F | Ht 73.0 in | Wt 200.4 lb

## 2020-12-10 DIAGNOSIS — I2510 Atherosclerotic heart disease of native coronary artery without angina pectoris: Secondary | ICD-10-CM | POA: Diagnosis not present

## 2020-12-10 DIAGNOSIS — I714 Abdominal aortic aneurysm, without rupture, unspecified: Secondary | ICD-10-CM

## 2020-12-10 NOTE — Progress Notes (Signed)
[facility] [place]  Patient name: [name] [name] MRN: [id number] DOB: [birth year] Sex: male  REASON FOR CONSULT: Evaluation infrarenal abdominal aortic aneurysm  HPI: [name] [name] is a 80 y.o. male, who is here today for discussion of infrarenal abdominal aortic aneurysm.  This was found incidentally by CT scan for evaluation of pancreatitis.  Apparently no etiology of his pancreatitis was identified.  He recently had follow-up CT showing maximal diameter of 5.1 cm and he is here today for discussion of this.  He has no symptoms referable to his aneurysm.  He is quite active at his age of 40.  Past Medical History:  Diagnosis Date   AAA (abdominal aortic aneurysm) ([place])    needs yearly ultrasound   Allergy    Anemia    Arthritis    Asthma    BCC (basal cell carcinoma) 08/18/1989   left shoulder blad, upper right arm, left arm beyond elbow, c&d   BCC (basal cell carcinoma) 01/31/1992   Posterior neck, curetx3, 28f   BCC (basal cell carcinoma) 11/22/2001   mid forehead, cx3, excision, right forearm, cx3, 523f  BCC (basal cell carcinoma) 10/09/2003   mid forehead, MOHs   BCC (basal cell carcinoma) 08/15/2008   upper left back, biopsy   BPH (benign prostatic hyperplasia)    CAD (coronary artery disease)    Cancer (HCC)    skin cancer   COPD (chronic obstructive pulmonary disease) ([place]   Dysrhythmia    pt. states it can be fast at times   GERD (gastroesophageal reflux disease)    Glaucoma    HOH (hard of hearing)    Hypercholesterolemia    Hypertension    Impaired fasting glucose    Low back pain    [place]   left chin, MOHs   MI [place] infarction) [place]   SCC (squamous cell carcinoma) 07/03/2014   in situ, behind left ear, cx3, cautery, 2f2f SCC (squamous cell carcinoma) 07/03/2014   well diff, left forearm, biopsy, cx1, cautery   SCC (squamous cell carcinoma) 07/20/2017    in situ, left upper arm, cx3, 2fu62fSCC (squamous cell carcinoma) 01/10/2019   in situ, left post shoulder, cx3, 2fu 59fCC (squamous cell carcinoma) 11/22/2001   left forearm distal, left forearm, cx3, 2fu  50fC (squamous cell carcinoma) 10/09/2003   [place], left ear post, clear per st, right cheek clear   SCC (squamous cell carcinoma) 03/30/2004   in situ, left upper arm, cx3, 2fu   72f (squamous cell carcinoma) 03/08/2005   in situ, right cheek, mid upper forehead, cx3, 2fu   S13f(squamous cell carcinoma) 06/08/2006   in situ, left shoulder, cx3, 2fu   SC64fsquamous cell carcinoma) 05/05/2010   right inner wrist, biopsy   SCC (squamous cell carcinoma) 09/13/2013   in situ, right crown scalp, front scalp, biopsy   Thrush     Family History  Problem Relation Age of Onset   Hypertension Mother    COPD Father    Cancer Brother        brain    SOCIAL HISTORY: Social History   Socioeconomic History   Marital status: Married    Spouse name: Not on file   Number of children: Not on file   Years of education: Not on file   Highest education level: Not on file  Occupational History   Not on file  Tobacco  Use   Smoking status: Former    Packs/day: 1.00    Years: 35.00    Pack years: 35.00    Types: Cigarettes    Quit date: 03/28/2003    Years since quitting: 17.7   Smokeless tobacco: Never  Vaping Use   Vaping Use: Never used  Substance and Sexual Activity   Alcohol use: No    Alcohol/week: 0.0 standard drinks   Drug use: No   Sexual activity: Not Currently    Birth control/protection: None  Other Topics Concern   Not on file  Social History Narrative   Not on file   Social Determinants of Health   Financial Resource Strain: Not on file  Food Insecurity: Not on file  Transportation Needs: Not on file  Physical Activity: Not on file  Stress: Not on file  Social Connections: Not on file  Intimate Partner Violence: Not on file    Allergies  Allergen  Reactions   Beta Adrenergic Blockers Diarrhea and Other (See Comments)    Does not recall an allergy   Lasix [Furosemide] Rash   Cefzil [Cefprozil] Nausea Only    Patient does recall allergy   Dexamethasone Swelling   Gabapentin Swelling   Neomycin Other (See Comments)   Tetracyclines & Related Itching   Ciprofloxacin Nausea And Vomiting, Rash and Other (See Comments)    Body aches    Methocarbamol Swelling and Rash   Penicillins Swelling and Rash    Has patient had a PCN reaction causing immediate rash, facial/tongue/throat swelling, SOB or lightheadedness with hypotension: yes Has patient had a PCN reaction causing severe rash involving mucus membranes or skin necrosis: unknown Has patient had a PCN reaction that required hospitalization: no Has patient had a PCN reaction occurring within the last 10 years: No If all of the above answers are "NO", then may proceed with Cephalosporin use.     Current Outpatient Medications  Medication Sig Dispense Refill   albuterol (PROVENTIL) (2.5 MG/3ML) 0.083% nebulizer solution Take 3 mLs (2.5 mg total) by nebulization every 6 (six) hours as needed for wheezing or shortness of breath. 100 mL 2   albuterol (VENTOLIN HFA) 108 (90 Base) MCG/ACT inhaler 2 puffs as needed     alfuzosin (UROXATRAL) 10 MG 24 hr tablet Take 1 tablet (10 mg total) by mouth daily with breakfast. 90 tablet 3   cetirizine (ZYRTEC) 10 MG tablet Take 10 mg by mouth daily.     COMBIGAN 0.2-0.5 % ophthalmic solution Apply 1 drop to eye 2 (two) times daily.     esomeprazole (NEXIUM) 40 MG capsule Take 40 mg by mouth daily.     fluticasone (FLOVENT HFA) 220 MCG/ACT inhaler Inhale 2 puffs into the lungs daily. (Patient taking differently: Inhale 2 puffs into the lungs in the morning and at bedtime.) 12 g 5   hydroxypropyl methylcellulose / hypromellose (ISOPTO TEARS / GONIOVISC) 2.5 % ophthalmic solution Place 1 drop into both eyes 3 (three) times daily as needed for dry eyes.      LORazepam (ATIVAN) 0.5 MG tablet Take 1 tablet (0.5 mg total) by mouth at bedtime. 30 tablet 3   losartan (COZAAR) 25 MG tablet Take 1 tablet (25 mg total) by mouth daily. 90 tablet 1   lovastatin (MEVACOR) 40 MG tablet Take 1 tablet (40 mg total) by mouth daily. 90 tablet 1   nitroGLYCERIN (NITROSTAT) 0.4 MG SL tablet Place 1 tablet (0.4 mg total) under the tongue every 5 (five) minutes as needed for  chest pain. 25 tablet 5   pseudoephedrine-guaifenesin (MUCINEX D) 60-600 MG 12 hr tablet Take 1 tablet by mouth 2 (two) times daily as needed for congestion.     sodium chloride (OCEAN) 0.65 % SOLN nasal spray Place 1 spray into both nostrils as needed for congestion.     No current facility-administered medications for this visit.    REVIEW OF SYSTEMS:  '[X]'$  denotes positive finding, '[ ]'$  denotes negative finding Cardiac  Comments:  Chest pain or chest pressure:    Shortness of breath upon exertion: x   Short of breath when lying flat:    Irregular heart rhythm:        Vascular    Pain in calf, thigh, or hip brought on by ambulation:    Pain in feet at night that wakes you up from your sleep:     Blood clot in your veins:    Leg swelling:         Pulmonary    Oxygen at home:    Productive cough:  x   Wheezing:         Neurologic    Sudden weakness in arms or legs:     Sudden numbness in arms or legs:     Sudden onset of difficulty speaking or slurred speech:    Temporary loss of vision in one eye:     Problems with dizziness:         Gastrointestinal    Blood in stool:     Vomited blood:         Genitourinary    Burning when urinating:     Blood in urine:        Psychiatric    Major depression:         Hematologic    Bleeding problems:    Problems with blood clotting too easily:        Skin    Rashes or ulcers:        Constitutional    Fever or chills:      PHYSICAL EXAM: Vitals:   12/10/20 1315  BP: 103/70  Pulse: 81  Temp: 98.2 F (36.8 C)  TempSrc:  Oral  SpO2: 91%  Weight: 200 lb 6.4 oz (90.9 kg)  Height: '6\' 1"'$  (1.854 m)    GENERAL: The patient is a well-nourished male, in no acute distress. The vital signs are documented above. CARDIOVASCULAR: 2+ radial, 2+ femoral, 2+ popliteal and 2+ dorsalis pedis pulses bilaterally.  No evidence of peripheral aneurysm.  I do not palpate an abdominal aortic aneurysm PULMONARY: There is good air exchange  MUSCULOSKELETAL: There are no major deformities or cyanosis. NEUROLOGIC: No focal weakness or paresthesias are detected. SKIN: There are no ulcers or rashes noted. PSYCHIATRIC: The patient has a normal affect.  DATA:  CT scan from 11/27/2020 was reviewed.  This was compared to his scan in November 2021.  His infrarenal aortic aneurysm has a maximal diameter of 5.1 cm.  This compares to 5.0 cm 8 months earlier.  He does have some posterior mural thrombus below the level of the renal arteries and then has an area of aorta which is not dilated and without thrombus.  He does not have iliac aneurysms.  MEDICAL ISSUES: Had long discussion with the patient regarding the significance of his aneurysm and the potential risk for rupture.  I explained that from a statistical standpoint his current risk is lower with observation surgical treatment.  I did discuss symptoms of leaking  aneurysm and he knows to call 911 immediately should this occur.  I recommended 31-monthCT follow-up.  I explained that he is at the threshold for elective repair.  I discussed stent graft and open aneurysm repair with him.  We will see him again in 6 months with repeat CT scan   TRosetta Posner MD FOhio Valley Medical CenterV[facility] and [facility]in [facility] of [facility]Kings [phone] ([phone] Note: Portions of this report may have been transcribed using voice recognition software.  Every effort has been made to ensure accuracy; however, inadvertent computerized transcription errors may still be present.

## 2020-12-11 ENCOUNTER — Encounter (HOSPITAL_COMMUNITY)
Admission: RE | Admit: 2020-12-11 | Discharge: 2020-12-11 | Disposition: A | Discharge status: Home / Self Care | Payer: Medicare Other | Source: Ambulatory Visit | Attending: Pulmonary Disease | Admitting: Pulmonary Disease

## 2020-12-11 DIAGNOSIS — J441 Chronic obstructive pulmonary disease with (acute) exacerbation: Secondary | ICD-10-CM

## 2020-12-22 NOTE — Progress Notes (Signed)
Discharge Progress Report  Patient Details  Name: [name] [name] MRN: [id number] Date of Birth: 1940/08/05 Referring Provider:   Flowsheet Row PULMONARY REHAB OTHER RESP ORIENTATION from 08/08/2020 in [place]  Referring Provider Dr. Ander Slade        Number of Visits: 28  Reason for Discharge:  Patient reached a stable level of exercise. Patient independent in their exercise. Patient has met program and personal goals.  Smoking History:  Social History   Tobacco Use  Smoking Status Former   Packs/day: 1.00   Years: 35.00   Pack years: 35.00   Types: Cigarettes   Quit date: 03/28/2003   Years since quitting: 17.7  Smokeless Tobacco Never    Diagnosis:  COPD with acute exacerbation ([place])  ADL [facility]:  Pulmonary Assessment Scores     Row Name 08/08/20 1254 12/22/20 0743       ADL [facility]   ADL Phase -- Exit    SOB Score total 22 26         CAT Score      CAT Score 19 19         mMRC Score      mMRC Score 2 2            Initial Exercise Prescription:  Initial Exercise Prescription - 08/08/20 1400       Date of Initial Exercise RX and Referring Provider   Date 08/08/20    Referring Provider Dr. Ander Slade    Expected Discharge Date 12/11/20      NuStep   Level 1    SPM 80    Minutes 22      Arm Ergometer   Level 1    RPM 45    Minutes 17      Prescription Details   Frequency (times per week) 2    Duration Progress to 30 minutes of continuous aerobic without signs/symptoms of physical distress      Intensity   THRR 40-80% of Max Heartrate 57-114    Ratings of Perceived Exertion 11-13    Perceived Dyspnea 0-4      Resistance Training   Training Prescription Yes    Weight 3 lbs    Reps 10-15             Discharge Exercise Prescription (Final Exercise Prescription Changes):  Exercise Prescription Changes - 11/27/20 1012       Response to Exercise   Blood Pressure (Admit) 110/64    Blood Pressure (Exercise)  138/78    Blood Pressure (Exit) 124/80    Heart Rate (Admit) 71 bpm    Heart Rate (Exercise) 87 bpm    Heart Rate (Exit) 76 bpm    Oxygen Saturation (Admit) 94 %    Oxygen Saturation (Exercise) 95 %    Oxygen Saturation (Exit) 96 %    Rating of Perceived Exertion (Exercise) 12    Perceived Dyspnea (Exercise) 12    Duration Continue with 30 min of aerobic exercise without signs/symptoms of physical distress.    Intensity THRR unchanged      Progression   Progression Continue to progress workloads to maintain intensity without signs/symptoms of physical distress.      Resistance Training   Training Prescription Yes    Weight 4 lbs    Reps 10-15    Time 10 Minutes      NuStep   Level 3    SPM 82    Minutes 22    METs 2.1  Arm Ergometer   Level 2    RPM 56    Minutes 17    METs 2.2             Functional Capacity:  6 Minute Walk     Row Name 08/08/20 1428 12/11/20 1427       6 Minute Walk   Phase Initial Discharge    Distance 1200 feet 1300 feet    Distance Feet Change -- 100 ft    Walk Time 6 minutes 6 minutes    # of Rest Breaks 0 0    MPH 2.27 2.46    METS 2.43 2.5    RPE 11 7    Perceived Dyspnea  13 8    VO2 Peak 8.51 8.77    Symptoms -- No    Resting HR 72 bpm 88 bpm    Resting BP 112/74 124/70    Resting Oxygen Saturation  96 % 96 %    Exercise Oxygen Saturation  during 6 min walk 91 % 90 %    Max Ex. HR 99 bpm 94 bpm    Max Ex. BP 128/64 128/72    2 Minute Post BP 106/70 106/60         Interval HR      1 Minute HR 96 90    2 Minute HR 99 92    3 Minute HR 97 92    4 Minute HR 93 92    5 Minute HR 96 94    6 Minute HR 96 90    2 Minute Post HR 83 81    Interval Heart Rate? Yes --         Interval Oxygen      Interval Oxygen? Yes Yes    Baseline Oxygen Saturation % 96 % 96 %    1 Minute Oxygen Saturation % 93 % 96 %    1 Minute Liters of Oxygen 0 L 0 L    2 Minute Oxygen Saturation % 91 % 95 %    2 Minute Liters of Oxygen 0 L 0  L    3 Minute Oxygen Saturation % 92 % 93 %    3 Minute Liters of Oxygen 0 L 0 L    4 Minute Oxygen Saturation % 91 % 90 %    4 Minute Liters of Oxygen 0 L 0 L    5 Minute Oxygen Saturation % 92 % 94 %    5 Minute Liters of Oxygen 0 L 0 L    6 Minute Oxygen Saturation % 92 % 91 %    6 Minute Liters of Oxygen 0 L 0 L    2 Minute Post Oxygen Saturation % 96 % 97 %    2 Minute Post Liters of Oxygen 0 L 0 L            Psychological, QOL, Others - Outcomes: PHQ 2/9: Depression screen [facility] 2/9 12/22/2020 10/23/2020 08/08/2020 06/25/2020 02/13/2019  Decreased Interest 0 0 3 0 0  Down, Depressed, Hopeless 0 0 0 0 0  PHQ - 2 Score 0 0 3 0 0  Altered sleeping 1 - 0 - -  Tired, decreased energy 1 - 3 - -  Change in appetite 2 - 0 - -  Feeling bad or failure about yourself  0 - 0 - -  Trouble concentrating 0 - 0 - -  Moving slowly or fidgety/restless 0 - 0 - -  Suicidal thoughts 0 -  0 - -  PHQ-9 Score 4 - 6 - -  Difficult doing work/chores Not difficult at all - Somewhat difficult - -  Some recent data might be hidden    Quality of Life:  Quality of Life - 12/22/20 0744       Quality of Life   Select Quality of Life      Quality of Life Scores   Health/Function Pre 9.34 %    Health/Function Post 18.29 %    Health/Function % Change 95.82 %    Socioeconomic Pre 16.4 %    Socioeconomic Post 21.8 %    Socioeconomic % Change  32.93 %    Psych/Spiritual Pre 15.08 %    Psych/Spiritual Post 20.5 %    Psych/Spiritual % Change 35.94 %    Family Pre 23.63 %    Family Post 24.88 %    Family % Change 5.29 %    GLOBAL Pre 13.44 %    GLOBAL Post 20.27 %    GLOBAL % Change 50.82 %             Personal Goals: Goals established at orientation with interventions provided to work toward goal.  Personal Goals and Risk Factors at Admission - 08/08/20 1304       Core Components/Risk Factors/Patient Goals on Admission    Weight Management Yes;Weight Loss    Intervention Weight Management:  Develop a combined nutrition and exercise program designed to reach desired caloric intake, while maintaining appropriate intake of nutrient and fiber, sodium and fats, and appropriate energy expenditure required for the weight goal.;Weight Management: Provide education and appropriate resources to help participant work on and attain dietary goals.;Weight Management/Obesity: Establish reasonable short term and long term weight goals.;Obesity: Provide education and appropriate resources to help participant work on and attain dietary goals.    Expected Outcomes Short Term: Continue to assess and modify interventions until short term weight is achieved;Long Term: Adherence to nutrition and physical activity/exercise program aimed toward attainment of established weight goal;Weight Loss: Understanding of general recommendations for a balanced deficit meal plan, which promotes 1-2 lb weight loss per week and includes a negative energy balance of [phone] kcal/d;Understanding recommendations for meals to include 15-35% energy as protein, 25-35% energy from fat, 35-60% energy from carbohydrates, less than 249m of dietary cholesterol, 20-35 gm of total fiber daily;Understanding of distribution of calorie intake throughout the day with the consumption of 4-5 meals/snacks    Improve shortness of breath with ADL's Yes    Intervention Provide education, individualized exercise plan and daily activity instruction to help decrease symptoms of SOB with activities of daily living.    Expected Outcomes Short Term: Improve cardiorespiratory fitness to achieve a reduction of symptoms when performing ADLs;Long Term: Be able to perform more ADLs without symptoms or delay the onset of symptoms    Stress Yes    Intervention Offer individual and/or small group education and counseling on adjustment to heart disease, stress management and health-related lifestyle change. Teach and support self-help strategies.;Refer participants  experiencing significant psychosocial distress to appropriate mental health specialists for further evaluation and treatment. When possible, include family members and significant others in education/counseling sessions.    Expected Outcomes Long Term: Emotional wellbeing is indicated by absence of clinically significant psychosocial distress or social isolation.;Short Term: Participant demonstrates changes in health-related behavior, relaxation and other stress management skills, ability to obtain effective social support, and compliance with psychotropic medications if prescribed.  Personal Goals Discharge:  Goals and Risk Factor Review     Row Name 08/13/20 1357 09/08/20 1415 10/08/20 1248 10/28/20 1529 11/25/20 0913     Core Components/Risk Factors/Patient Goals Review   Personal Goals Review Improve shortness of breath with ADL's Improve shortness of breath with ADL's Improve shortness of breath with ADL's Improve shortness of breath with ADL's Improve shortness of breath with ADL's   Review Patient is new in the program.  His O2 sats was good on room air at 95-96% while exercising.  He seems to have a positive outlook and very interative with others in class.  His personal goals are to get stronger and have more energy and increase ADL's.  We will continue to monitor as he works toward meeting these goals. Patient has completed 8 sessions.  His O2 sats was good on room air at 93-96% while exercising. Had appointment with Dr. Luna Glasgow on 08-21-20. He continues to have pain in left shoulder. no changes made in medication.  Continue on current medication regimen. He seems to have a positive outlook and very interative with others in class.  His personal goals are to get stronger and have more energy and increase ADL's.  We will continue to monitor as he works toward meeting these goals. Patient has completed 14 sessions gaining 1 lb since last 30 day review. He continue to do well in the  program with consistent attendance and progressions. His blood pressure is well controlled and his O2 saturations are averaging 96% on RA while exercising. He saw his pulmonologist 5/5 for a routine check. No changes made. Patient told MD that PR was benefiting him. He continue sto have pain in his left shoulder and plans to start PT for this after he completes PR. His personal goals for the program are to lose weight; have less SOB; and get stronger. We will continue to monitor as he works toaward meeting these goals. Patient has completed 19 sessions.  Weight remains same for the past 4 weeks at 90.6 kg. He continue to do well in the program with consistent attendance and progressions. His blood pressure is well controlled and his O2 saturations are averaging between 94-96% on RA while exercising on the nu-step.  He continue to have pain in his left shoulder and plans to start PT for this after he completes PR. His personal goals for the program are to lose weight; have less SOB; and get stronger. We will continue to monitor as he works toaward meeting these goals. Patient has completed 25 sessions. Continues to maintain and controll  weight at 89.9 kg. He continue to do well in the program with consistent attendance and progressions. His blood pressure is well controlled and his O2 saturations are averaging between 95%-96% on RA while exercising on the nu-step.  Plans to have CT of Abdominal and pelvis, doctor continues to monitor AAA .  He continue to have pain in his left shoulder and plans to start PT for this after he completes PR. His personal goals for the program are to lose weight; have less SOB; and get stronger. We will continue to monitor as he works toaward meeting these goals.   Expected Outcomes Patient will complete the program meeting both program and personal goals. Patient will complete the program meeting both program and personal goals. Patient will complete the program meeting both program  and personal goals. Patient will complete the program meeting both program and personal goals. Patient will complete the program meeting  both program and personal goals.    [place] Name 12/22/20 0747             Core Components/Risk Factors/Patient Goals Review   Personal Goals Review Improve shortness of breath with ADL's       Review Patient graduated from the program after 28 sessions. His weight was stable throughout the course of the program. All vitals were WNL while he was in the program. He reported that his SOB has decreased while doing ADLs and he will continue to work towards his goals after he graduates. He states he will exercise at home and may consider joining the maintenance program here.       Expected Outcomes Patient will continue to work towards their personal goals post discharge.                Exercise Goals and Review:  Exercise Goals     Row Name 08/08/20 1432 08/19/20 1616 09/16/20 1412 10/14/20 1542 11/04/20 1539     Exercise Goals   Increase Physical Activity Yes Yes Yes Yes Yes   Intervention Provide advice, education, support and counseling about physical activity/exercise needs.;Develop an individualized exercise prescription for aerobic and resistive training based on initial evaluation findings, risk stratification, comorbidities and participant's personal goals. Provide advice, education, support and counseling about physical activity/exercise needs.;Develop an individualized exercise prescription for aerobic and resistive training based on initial evaluation findings, risk stratification, comorbidities and participant's personal goals. Provide advice, education, support and counseling about physical activity/exercise needs.;Develop an individualized exercise prescription for aerobic and resistive training based on initial evaluation findings, risk stratification, comorbidities and participant's personal goals. Provide advice, education, support and counseling  about physical activity/exercise needs.;Develop an individualized exercise prescription for aerobic and resistive training based on initial evaluation findings, risk stratification, comorbidities and participant's personal goals. Provide advice, education, support and counseling about physical activity/exercise needs.;Develop an individualized exercise prescription for aerobic and resistive training based on initial evaluation findings, risk stratification, comorbidities and participant's personal goals.   Expected Outcomes Short Term: Attend rehab on a regular basis to increase amount of physical activity.;Long Term: Add in home exercise to make exercise part of routine and to increase amount of physical activity.;Long Term: Exercising regularly at least 3-5 days a week. Short Term: Attend rehab on a regular basis to increase amount of physical activity.;Long Term: Add in home exercise to make exercise part of routine and to increase amount of physical activity.;Long Term: Exercising regularly at least 3-5 days a week. Short Term: Attend rehab on a regular basis to increase amount of physical activity.;Long Term: Add in home exercise to make exercise part of routine and to increase amount of physical activity.;Long Term: Exercising regularly at least 3-5 days a week. Short Term: Attend rehab on a regular basis to increase amount of physical activity.;Long Term: Add in home exercise to make exercise part of routine and to increase amount of physical activity.;Long Term: Exercising regularly at least 3-5 days a week. Short Term: Attend rehab on a regular basis to increase amount of physical activity.;Long Term: Add in home exercise to make exercise part of routine and to increase amount of physical activity.;Long Term: Exercising regularly at least 3-5 days a week.   Increase Strength and Stamina Yes Yes Yes Yes Yes   Intervention Provide advice, education, support and counseling about physical activity/exercise  needs.;Develop an individualized exercise prescription for aerobic and resistive training based on initial evaluation findings, risk stratification, comorbidities and participant's personal goals. Provide  advice, education, support and counseling about physical activity/exercise needs.;Develop an individualized exercise prescription for aerobic and resistive training based on initial evaluation findings, risk stratification, comorbidities and participant's personal goals. Provide advice, education, support and counseling about physical activity/exercise needs.;Develop an individualized exercise prescription for aerobic and resistive training based on initial evaluation findings, risk stratification, comorbidities and participant's personal goals. Provide advice, education, support and counseling about physical activity/exercise needs.;Develop an individualized exercise prescription for aerobic and resistive training based on initial evaluation findings, risk stratification, comorbidities and participant's personal goals. Provide advice, education, support and counseling about physical activity/exercise needs.;Develop an individualized exercise prescription for aerobic and resistive training based on initial evaluation findings, risk stratification, comorbidities and participant's personal goals.   Expected Outcomes Short Term: Increase workloads from initial exercise prescription for resistance, speed, and METs.;Short Term: Perform resistance training exercises routinely during rehab and add in resistance training at home;Long Term: Improve cardiorespiratory fitness, muscular endurance and strength as measured by increased METs and functional capacity (6MWT) Short Term: Increase workloads from initial exercise prescription for resistance, speed, and METs.;Short Term: Perform resistance training exercises routinely during rehab and add in resistance training at home;Long Term: Improve cardiorespiratory fitness,  muscular endurance and strength as measured by increased METs and functional capacity (6MWT) Short Term: Increase workloads from initial exercise prescription for resistance, speed, and METs.;Short Term: Perform resistance training exercises routinely during rehab and add in resistance training at home;Long Term: Improve cardiorespiratory fitness, muscular endurance and strength as measured by increased METs and functional capacity (6MWT) Short Term: Increase workloads from initial exercise prescription for resistance, speed, and METs.;Short Term: Perform resistance training exercises routinely during rehab and add in resistance training at home;Long Term: Improve cardiorespiratory fitness, muscular endurance and strength as measured by increased METs and functional capacity (6MWT) Short Term: Increase workloads from initial exercise prescription for resistance, speed, and METs.;Short Term: Perform resistance training exercises routinely during rehab and add in resistance training at home;Long Term: Improve cardiorespiratory fitness, muscular endurance and strength as measured by increased METs and functional capacity (6MWT)   Able to understand and use rate of perceived exertion (RPE) scale Yes Yes Yes Yes Yes   Intervention Provide education and explanation on how to use RPE scale Provide education and explanation on how to use RPE scale Provide education and explanation on how to use RPE scale Provide education and explanation on how to use RPE scale Provide education and explanation on how to use RPE scale   Expected Outcomes Short Term: Able to use RPE daily in rehab to express subjective intensity level;Long Term:  Able to use RPE to guide intensity level when exercising independently Short Term: Able to use RPE daily in rehab to express subjective intensity level;Long Term:  Able to use RPE to guide intensity level when exercising independently Short Term: Able to use RPE daily in rehab to express  subjective intensity level;Long Term:  Able to use RPE to guide intensity level when exercising independently Short Term: Able to use RPE daily in rehab to express subjective intensity level;Long Term:  Able to use RPE to guide intensity level when exercising independently Short Term: Able to use RPE daily in rehab to express subjective intensity level;Long Term:  Able to use RPE to guide intensity level when exercising independently   Able to understand and use Dyspnea scale Yes Yes Yes Yes Yes   Intervention Provide education and explanation on how to use Dyspnea scale Provide education and explanation on how to use Dyspnea  scale Provide education and explanation on how to use Dyspnea scale Provide education and explanation on how to use Dyspnea scale Provide education and explanation on how to use Dyspnea scale   Expected Outcomes Short Term: Able to use Dyspnea scale daily in rehab to express subjective sense of shortness of breath during exertion;Long Term: Able to use Dyspnea scale to guide intensity level when exercising independently Short Term: Able to use Dyspnea scale daily in rehab to express subjective sense of shortness of breath during exertion;Long Term: Able to use Dyspnea scale to guide intensity level when exercising independently Short Term: Able to use Dyspnea scale daily in rehab to express subjective sense of shortness of breath during exertion;Long Term: Able to use Dyspnea scale to guide intensity level when exercising independently Short Term: Able to use Dyspnea scale daily in rehab to express subjective sense of shortness of breath during exertion;Long Term: Able to use Dyspnea scale to guide intensity level when exercising independently Short Term: Able to use Dyspnea scale daily in rehab to express subjective sense of shortness of breath during exertion;Long Term: Able to use Dyspnea scale to guide intensity level when exercising independently   Knowledge and understanding of Target  Heart Rate Range (THRR) Yes Yes Yes Yes Yes   Intervention Provide education and explanation of THRR including how the numbers were predicted and where they are located for reference Provide education and explanation of THRR including how the numbers were predicted and where they are located for reference Provide education and explanation of THRR including how the numbers were predicted and where they are located for reference Provide education and explanation of THRR including how the numbers were predicted and where they are located for reference Provide education and explanation of THRR including how the numbers were predicted and where they are located for reference   Expected Outcomes Short Term: Able to state/look up THRR;Long Term: Able to use THRR to govern intensity when exercising independently;Short Term: Able to use daily as guideline for intensity in rehab Short Term: Able to state/look up THRR;Long Term: Able to use THRR to govern intensity when exercising independently;Short Term: Able to use daily as guideline for intensity in rehab Short Term: Able to state/look up THRR;Long Term: Able to use THRR to govern intensity when exercising independently;Short Term: Able to use daily as guideline for intensity in rehab Short Term: Able to state/look up THRR;Long Term: Able to use THRR to govern intensity when exercising independently;Short Term: Able to use daily as guideline for intensity in rehab Short Term: Able to state/look up THRR;Long Term: Able to use THRR to govern intensity when exercising independently;Short Term: Able to use daily as guideline for intensity in rehab   Understanding of Exercise Prescription Yes Yes Yes Yes Yes   Intervention Provide education, explanation, and written materials on patient's individual exercise prescription Provide education, explanation, and written materials on patient's individual exercise prescription Provide education, explanation, and written materials  on patient's individual exercise prescription Provide education, explanation, and written materials on patient's individual exercise prescription Provide education, explanation, and written materials on patient's individual exercise prescription   Expected Outcomes Short Term: Able to explain program exercise prescription;Long Term: Able to explain home exercise prescription to exercise independently Short Term: Able to explain program exercise prescription;Long Term: Able to explain home exercise prescription to exercise independently Short Term: Able to explain program exercise prescription;Long Term: Able to explain home exercise prescription to exercise independently Short Term: Able to explain program exercise prescription;Long  Term: Able to explain home exercise prescription to exercise independently Short Term: Able to explain program exercise prescription;Long Term: Able to explain home exercise prescription to exercise independently    [place] Name 12/02/20 1014             Exercise Goals   Increase Physical Activity Yes       Intervention Provide advice, education, support and counseling about physical activity/exercise needs.;Develop an individualized exercise prescription for aerobic and resistive training based on initial evaluation findings, risk stratification, comorbidities and participant's personal goals.       Expected Outcomes Short Term: Attend rehab on a regular basis to increase amount of physical activity.;Long Term: Add in home exercise to make exercise part of routine and to increase amount of physical activity.;Long Term: Exercising regularly at least 3-5 days a week.       Increase Strength and Stamina Yes       Intervention Provide advice, education, support and counseling about physical activity/exercise needs.;Develop an individualized exercise prescription for aerobic and resistive training based on initial evaluation findings, risk stratification, comorbidities and  participant's personal goals.       Expected Outcomes Short Term: Increase workloads from initial exercise prescription for resistance, speed, and METs.;Short Term: Perform resistance training exercises routinely during rehab and add in resistance training at home;Long Term: Improve cardiorespiratory fitness, muscular endurance and strength as measured by increased METs and functional capacity (6MWT)       Able to understand and use rate of perceived exertion (RPE) scale Yes       Intervention Provide education and explanation on how to use RPE scale       Expected Outcomes Short Term: Able to use RPE daily in rehab to express subjective intensity level;Long Term:  Able to use RPE to guide intensity level when exercising independently       Able to understand and use Dyspnea scale Yes       Intervention Provide education and explanation on how to use Dyspnea scale       Expected Outcomes Short Term: Able to use Dyspnea scale daily in rehab to express subjective sense of shortness of breath during exertion;Long Term: Able to use Dyspnea scale to guide intensity level when exercising independently       Knowledge and understanding of Target Heart Rate Range (THRR) Yes       Intervention Provide education and explanation of THRR including how the numbers were predicted and where they are located for reference       Expected Outcomes Short Term: Able to state/look up THRR;Long Term: Able to use THRR to govern intensity when exercising independently;Short Term: Able to use daily as guideline for intensity in rehab       Understanding of Exercise Prescription Yes       Intervention Provide education, explanation, and written materials on patient's individual exercise prescription       Expected Outcomes Short Term: Able to explain program exercise prescription;Long Term: Able to explain home exercise prescription to exercise independently                Exercise Goals Re-Evaluation:  Exercise Goals  Re-Evaluation     Row Name 08/19/20 1616 09/16/20 1419 10/14/20 1542 11/04/20 1539 12/02/20 1014     Exercise Goal Re-Evaluation   Exercise Goals Review Increase Physical Activity;Increase Strength and Stamina;Able to understand and use rate of perceived exertion (RPE) scale;Able to understand and use Dyspnea scale;Knowledge and understanding of Target Heart Rate  Range (THRR);Understanding of Exercise Prescription Increase Physical Activity;Increase Strength and Stamina;Able to understand and use rate of perceived exertion (RPE) scale;Able to understand and use Dyspnea scale;Knowledge and understanding of Target Heart Rate Range (THRR);Understanding of Exercise Prescription Increase Physical Activity;Increase Strength and Stamina;Able to understand and use rate of perceived exertion (RPE) scale;Able to understand and use Dyspnea scale;Knowledge and understanding of Target Heart Rate Range (THRR);Understanding of Exercise Prescription Increase Physical Activity;Increase Strength and Stamina;Able to understand and use rate of perceived exertion (RPE) scale;Able to understand and use Dyspnea scale;Knowledge and understanding of Target Heart Rate Range (THRR);Understanding of Exercise Prescription Increase Physical Activity;Increase Strength and Stamina;Able to understand and use rate of perceived exertion (RPE) scale;Able to understand and use Dyspnea scale;Knowledge and understanding of Target Heart Rate Range (THRR);Understanding of Exercise Prescription   Comments Pt has attended 3 rehab sessions. He has been able to tolerate exercise well and should progress well through the program. He currently exercises at 2.0 METs on the stepper. Will continue to monitor and progress as able. Patient has completed 11 exercise sessions. He has tolerated exercise well with no complaints. He is making progress in the program and is enjoying the program. He is very interested in tracking his progress and learning how we  increase his intensities. He is very positive to have in class and is very interactive with the staff and others in his class. He is currently exercising at 2.0 METs on the NuStep. Will continue to monitor and progress as able. patient has completed 15 exercise sessions. He has tolerated exercise well and is progressing slowly. He is very eager to progress and eager to learn about hisprogress. He asks questions frequently. He is very positive in class and is very interactive with the staff and others in his class. he is currently exercising at 2.1 METs on the Nustep. Will continue to monitor and progress as able. Pt has completed 21 exercise sessions. He continues to progress and has began to increase his workloads. He reports that he can tell that he is getting stronger. He is currently exercising at 2.1 METs on the stepper. Will continue to monitor and progress as able. Pt has completed 26 exercise sessions. He will graduate from the program next week, so I will discuss with him how he will continue his exercise. He has benefited from the program and reports that he has gotten stronger. He is currently exercising at 2.1 METs on the stepper. Will continue to monitor and progress as able.   Expected Outcomes Through exercise at rehab and through engaging in a home exercise program, the patient will meet their stated goals. Through exercise at rehab and through engaging in a home exercise program, the patient will meet their stated goals. Through exercise at rehab and through engaging in a home exercise program, the patient will meet their stated goals. Through exercise at rehab and through engaging in a home exercise program, the patient will meet their stated goals. Through exercise at rehab and through engaging in a home exercise program, the patient will meet their stated goals.            Nutrition & Weight - Outcomes:  Pre Biometrics - 10/14/20 1545       Pre Biometrics   Weight 199 lb 11.8 oz  (90.6 kg)    BMI (Calculated) 26.36              Nutrition:  Nutrition Therapy & Goals - 11/25/20 4193  Personal Nutrition Goals   Comments We will continue to provide nutritional education and monitor.      Intervention Plan   Intervention Nutrition handout(s) given to patient.             Nutrition Discharge:  Nutrition Assessments - 12/22/20 0743       MEDFICTS Scores   Pre Score 84    Post Score 62    Score Difference -22             Education Questionnaire Score:  Knowledge Questionnaire Score - 12/22/20 0744       Knowledge Questionnaire Score   Pre Score 14/18    Post Score 13/18             Goals reviewed with patient; copy given to patient. Pt graduated from pulmonary rehab after 28 sessions. His attendance was consistent throughout the program. He was able to increase his 6MWT distance by 100 ft from orientation to discharge. His PHQ-9 decreased from a 6 to a 4. His weight was stable while in the program and vitals remained WNL during his time in rehab. He states that he will exercise on his own at home, but may return to the [employer] program if he is unable to exercise on his own.

## 2020-12-25 DIAGNOSIS — X32XXXD Exposure to sunlight, subsequent encounter: Secondary | ICD-10-CM | POA: Diagnosis not present

## 2020-12-25 DIAGNOSIS — S50819D Abrasion of unspecified forearm, subsequent encounter: Secondary | ICD-10-CM | POA: Diagnosis not present

## 2020-12-25 DIAGNOSIS — L570 Actinic keratosis: Secondary | ICD-10-CM | POA: Diagnosis not present

## 2021-01-05 DIAGNOSIS — Z0000 Encounter for general adult medical examination without abnormal findings: Secondary | ICD-10-CM | POA: Diagnosis not present

## 2021-01-05 DIAGNOSIS — I10 Essential (primary) hypertension: Secondary | ICD-10-CM | POA: Diagnosis not present

## 2021-01-08 DIAGNOSIS — Z9049 Acquired absence of other specified parts of digestive tract: Secondary | ICD-10-CM | POA: Diagnosis not present

## 2021-01-08 DIAGNOSIS — R7301 Impaired fasting glucose: Secondary | ICD-10-CM | POA: Diagnosis not present

## 2021-01-08 DIAGNOSIS — H9193 Unspecified hearing loss, bilateral: Secondary | ICD-10-CM | POA: Diagnosis not present

## 2021-01-08 DIAGNOSIS — H3323 Serous retinal detachment, bilateral: Secondary | ICD-10-CM | POA: Diagnosis not present

## 2021-01-08 DIAGNOSIS — I2510 Atherosclerotic heart disease of native coronary artery without angina pectoris: Secondary | ICD-10-CM | POA: Diagnosis not present

## 2021-01-08 DIAGNOSIS — F17201 Nicotine dependence, unspecified, in remission: Secondary | ICD-10-CM | POA: Diagnosis not present

## 2021-01-08 DIAGNOSIS — J449 Chronic obstructive pulmonary disease, unspecified: Secondary | ICD-10-CM | POA: Diagnosis not present

## 2021-01-08 DIAGNOSIS — L719 Rosacea, unspecified: Secondary | ICD-10-CM | POA: Diagnosis not present

## 2021-01-08 DIAGNOSIS — H409 Unspecified glaucoma: Secondary | ICD-10-CM | POA: Diagnosis not present

## 2021-01-08 DIAGNOSIS — I714 Abdominal aortic aneurysm, without rupture: Secondary | ICD-10-CM | POA: Diagnosis not present

## 2021-01-08 DIAGNOSIS — N401 Enlarged prostate with lower urinary tract symptoms: Secondary | ICD-10-CM | POA: Diagnosis not present

## 2021-01-08 DIAGNOSIS — E782 Mixed hyperlipidemia: Secondary | ICD-10-CM | POA: Diagnosis not present

## 2021-01-12 ENCOUNTER — Ambulatory Visit: Payer: Medicare Other | Admitting: Dermatology

## 2021-01-17 ENCOUNTER — Telehealth: Payer: Self-pay

## 2021-01-17 NOTE — Telephone Encounter (Signed)
Tried calling pt and could not reach to schedule Medicare Wellness visit.

## 2021-01-18 ENCOUNTER — Ambulatory Visit: Payer: Medicare Other

## 2021-01-22 DIAGNOSIS — X32XXXD Exposure to sunlight, subsequent encounter: Secondary | ICD-10-CM | POA: Diagnosis not present

## 2021-01-22 DIAGNOSIS — L570 Actinic keratosis: Secondary | ICD-10-CM | POA: Diagnosis not present

## 2021-01-22 DIAGNOSIS — D225 Melanocytic nevi of trunk: Secondary | ICD-10-CM | POA: Diagnosis not present

## 2021-01-22 DIAGNOSIS — C44319 Basal cell carcinoma of skin of other parts of face: Secondary | ICD-10-CM | POA: Diagnosis not present

## 2021-01-23 ENCOUNTER — Other Ambulatory Visit: Payer: Self-pay

## 2021-01-23 ENCOUNTER — Telehealth: Payer: Self-pay | Admitting: Cardiovascular Disease

## 2021-01-23 DIAGNOSIS — I714 Abdominal aortic aneurysm, without rupture, unspecified: Secondary | ICD-10-CM

## 2021-01-23 NOTE — Telephone Encounter (Signed)
Checking percert on the following patient for testing scheduled at [facility].    [employer] [place]  01-30-2021

## 2021-01-26 ENCOUNTER — Other Ambulatory Visit: Payer: Self-pay

## 2021-01-26 ENCOUNTER — Telehealth: Payer: Medicare Other | Admitting: Urology

## 2021-01-28 ENCOUNTER — Ambulatory Visit (INDEPENDENT_AMBULATORY_CARE_PROVIDER_SITE_OTHER): Payer: Medicare Other | Admitting: Urology

## 2021-01-28 ENCOUNTER — Other Ambulatory Visit: Payer: Self-pay

## 2021-01-28 ENCOUNTER — Encounter: Payer: Self-pay | Admitting: Urology

## 2021-01-28 DIAGNOSIS — I2510 Atherosclerotic heart disease of native coronary artery without angina pectoris: Secondary | ICD-10-CM

## 2021-01-28 DIAGNOSIS — R351 Nocturia: Secondary | ICD-10-CM

## 2021-01-28 DIAGNOSIS — N401 Enlarged prostate with lower urinary tract symptoms: Secondary | ICD-10-CM | POA: Diagnosis not present

## 2021-01-28 MED ORDER — ALFUZOSIN HCL ER 10 MG PO TB24: 10.0000 mg | ORAL_TABLET | Freq: Every day | ORAL | 3 refills | Status: DC

## 2021-01-28 NOTE — Patient Instructions (Signed)
Benign Prostatic Hyperplasia Benign prostatic hyperplasia (BPH) is an enlarged prostate gland that is caused by the normal aging process and not by cancer. The prostate is a walnut-sized gland that is involved in the production of semen. It is located in front of the rectum and below the bladder. The bladder stores urine and the urethra is the tube that carries the urine out of the body. The prostate may get bigger as a man gets older. An enlarged prostate can press on the urethra. This can make it harder to pass urine. The build-up of urine in the bladder can cause infection. Back pressure and infection may progress to bladder damage and kidney (renal) failure. What are the causes? This condition is part of a normal aging process. However, not all men develop problems from this condition. If the prostate enlarges away from the urethra, urine flow will not be blocked. If it enlarges toward the urethra and compresses it, there will be problems passing urine. What increases the risk? This condition is more likely to develop in men over the age of 50 years. What are the signs or symptoms? Symptoms of this condition include: Getting up often during the night to urinate. Needing to urinate frequently during the day. Difficulty starting urine flow. Decrease in size and strength of your urine stream. Leaking (dribbling) after urinating. Inability to pass urine. This needs immediate treatment. Inability to completely empty your bladder. Pain when you pass urine. This is more common if there is also an infection. Urinary tract infection (UTI). How is this diagnosed? This condition is diagnosed based on your medical history, a physical exam, and your symptoms. Tests will also be done, such as: A post-void bladder scan. This measures any amount of urine that may remain in your bladder after you finish urinating. A digital rectal exam. In a rectal exam, your health care provider checks your prostate by  putting a lubricated, gloved finger into your rectum to feel the back of your prostate gland. This exam detects the size of your gland and any abnormal lumps or growths. An exam of your urine (urinalysis). A prostate specific antigen (PSA) screening. This is a blood test used to screen for prostate cancer. An ultrasound. This test uses sound waves to electronically produce a picture of your prostate gland. Your health care provider may refer you to a specialist in kidney and prostate diseases (urologist). How is this treated? Once symptoms begin, your health care provider will monitor your condition (active surveillance or watchful waiting). Treatment for this condition will depend on the severity of your condition. Treatment may include: Observation and yearly exams. This may be the only treatment needed if your condition and symptoms are mild. Medicines to relieve your symptoms, including: Medicines to shrink the prostate. Medicines to relax the muscle of the prostate. Surgery in severe cases. Surgery may include: Prostatectomy. In this procedure, the prostate tissue is removed completely through an open incision or with a laparoscope or robotics. Transurethral resection of the prostate (TURP). In this procedure, a tool is inserted through the opening at the tip of the penis (urethra). It is used to cut away tissue of the inner core of the prostate. The pieces are removed through the same opening of the penis. This removes the blockage. Transurethral incision (TUIP). In this procedure, small cuts are made in the prostate. This lessens the prostate's pressure on the urethra. Transurethral microwave thermotherapy (TUMT). This procedure uses microwaves to create heat. The heat destroys and removes a   small amount of prostate tissue. Transurethral needle ablation (TUNA). This procedure uses radio frequencies to destroy and remove a small amount of prostate tissue. Interstitial laser coagulation (ILC).  This procedure uses a laser to destroy and remove a small amount of prostate tissue. Transurethral electrovaporization (TUVP). This procedure uses electrodes to destroy and remove a small amount of prostate tissue. Prostatic urethral lift. This procedure inserts an implant to push the lobes of the prostate away from the urethra. Follow these instructions at home: Take over-the-counter and prescription medicines only as told by your health care provider. Monitor your symptoms for any changes. Contact your health care provider with any changes. Avoid drinking large amounts of liquid before going to bed or out in public. Avoid or reduce how much caffeine or alcohol you drink. Give yourself time when you urinate. Keep all follow-up visits as told by your health care provider. This is important. Contact a health care provider if: You have unexplained back pain. Your symptoms do not get better with treatment. You develop side effects from the medicine you are taking. Your urine becomes very dark or has a bad smell. Your lower abdomen becomes distended and you have trouble passing your urine. Get help right away if: You have a fever or chills. You suddenly cannot urinate. You feel lightheaded, or very dizzy, or you faint. There are large amounts of blood or clots in the urine. Your urinary problems become hard to manage. You develop moderate to severe low back or flank pain. The flank is the side of your body between the ribs and the hip. These symptoms may represent a serious problem that is an emergency. Do not wait to see if the symptoms will go away. Get medical help right away. Call your local emergency services (911 in the [place] Do not drive yourself to the hospital. Summary Benign prostatic hyperplasia (BPH) is an enlarged prostate that is caused by the normal aging process and not by cancer. An enlarged prostate can press on the urethra. This can make it hard to pass urine. This  condition is part of a normal aging process and is more likely to develop in men over the age of 50 years. Get help right away if you suddenly cannot urinate. This information is not intended to replace advice given to you by your health care provider. Make sure you discuss any questions you have with your health care provider. Document Revised: 08/13/2020 Document Reviewed: 01/10/2020 [employer] Patient Education  2022 [employer]  

## 2021-01-28 NOTE — Progress Notes (Signed)
01/28/2021 10:20 AM   [name] [name] 08/07/1940 [id number]  Referring provider: Erven Colla, DO [address] [place],  [place] [address]  Patient location: home Physician location: office I connected with  [name] [name] on 01/28/21 by a video enabled telemedicine application and verified that I am speaking with the correct person using two identifiers.   I discussed the limitations of evaluation and management by telemedicine. The patient expressed understanding and agreed to proceed.    Followup BPH  HPI: [name] [name] is a 80yo here for followup for BPH and nocturia. He is on uroxatral '10mg'$  daily. He has stable nocturia 0-2x. He has intermittent dysuria if he wakes too long to urinating. Urine stream strong. No urinary frequency or urgency. Overall he is happy with his urination. He denies any hematuria. No hx of UTIs.  No other complaints.    PMH: Past Medical History:  Diagnosis Date   AAA (abdominal aortic aneurysm) ([place])    needs yearly ultrasound   Allergy    Anemia    Arthritis    Asthma    BCC (basal cell carcinoma) 08/18/1989   left shoulder blad, upper right arm, left arm beyond elbow, c&d   BCC (basal cell carcinoma) 01/31/1992   Posterior neck, curetx3, 17f   BCC (basal cell carcinoma) 11/22/2001   mid forehead, cx3, excision, right forearm, cx3, 511f  BCC (basal cell carcinoma) 10/09/2003   mid forehead, MOHs   BCC (basal cell carcinoma) 08/15/2008   upper left back, biopsy   BPH (benign prostatic hyperplasia)    CAD (coronary artery disease)    Cancer (HCC)    skin cancer   [place]) [place]   Dysrhythmia    pt. states it can be fast at times   GERD (gastroesophageal reflux disease)    Glaucoma    HOH (hard of hearing)    Hypercholesterolemia    Hypertension    Impaired fasting glucose    Low back pain    [place] in situ [place]   left chin, MOHs   MI ([place]ocardial infarction) [place]   SCC (squamous  cell carcinoma) 07/03/2014   in situ, behind left ear, cx3, cautery, [id number] SCC (squamous cell carcinoma) 07/03/2014   well diff, left forearm, biopsy, cx1, cautery   SCC (squamous cell carcinoma) 07/20/2017   in situ, left upper arm, cx3, 46fu11fSCC (squamous cell carcinoma) 01/10/2019   in situ, left post shoulder, cx3, 46fu 62fCC (squamous cell carcinoma) 11/22/2001   left forearm distal, left forearm, cx3, 46fu  84fC (squamous cell carcinoma) 10/09/2003   [place], left ear post, clear per st, right cheek clear   SCC (squamous cell carcinoma) 03/30/2004   in situ, left upper arm, cx3, 46fu   31f (squamous cell carcinoma) 03/08/2005   in situ, right cheek, mid upper forehead, cx3, 46fu   S56f(squamous cell carcinoma) 06/08/2006   in situ, left shoulder, cx3, 46fu   SC24fsquamous cell carcinoma) 05/05/2010   right inner wrist, biopsy   SCC (squamous cell carcinoma) 09/13/2013   in situ, right crown scalp, front scalp, biopsy   Thrush     Surgical History: Past Surgical History:  Procedure Laterality Date   BACK SURGERY     x 3   CARDIAC CATHETERIZATION     angioplasty   CATARACT EXTRACTION W/PHACO  03/20/2012   Procedure: CATARACT EXTRACTION PHACO AND INTRAOCULAR LENS PLACEMENT (IOC);  [place]:  Williams Che, MD;  Location: [facility];  Service: Ophthalmology;  Laterality: Right;  CDE:  8.45   CATARACT EXTRACTION W/PHACO Left 04/02/2013   Procedure: CATARACT EXTRACTION PHACO AND INTRAOCULAR LENS PLACEMENT (IOC);  Surgeon: Williams Che, MD;  Location: [facility];  Service: Ophthalmology;  Laterality: Left;  CDE:  6.50   CHOLECYSTECTOMY  2000   COLONOSCOPY  2009   repeat 5 years   ESOPHAGOGASTRODUODENOSCOPY     HERNIA REPAIR Left    inguinal   INGUINAL HERNIA REPAIR Right 03/28/2020   Procedure: Right Inguinal Herniorrhaphy with Mesh;  Surgeon: Aviva Signs, MD;  Location: [facility];  Service: General;  Laterality: Right;   LAPAROSCOPIC PARTIAL COLECTOMY N/A 06/11/2013   Procedure:  LAPAROSCOPIC HAND ASSISTED PARTIAL COLECTOMY;  Surgeon: Jamesetta So, MD;  Location: [facility];  Service: General;  Laterality: N/A;   NASAL ENDOSCOPY WITH EPISTAXIS CONTROL Bilateral 02/11/2020   Procedure: NASAL ENDOSCOPY WITH EPISTAXIS CONTROL;  Surgeon: Leta Baptist, MD;  Location: [facility];  Service: ENT;  Laterality: Bilateral;   right eye detached retina Bilateral    SPINAL FUSION  [id number]   YAG LASER APPLICATION Left [id number]   Procedure: YAG LASER APPLICATION;  Surgeon: Williams Che, MD;  Location: [facility];  Service: Ophthalmology;  Laterality: Left;    Home Medications:  Allergies as of 01/28/2021       Reactions   Beta Adrenergic Blockers Diarrhea, Other (See Comments)   Does not recall an allergy   Lasix [furosemide] Rash   Cefzil [cefprozil] Nausea Only   Patient does recall allergy   Dexamethasone Swelling   Gabapentin Swelling   Neomycin Other (See Comments)   Tetracyclines & Related Itching   Ciprofloxacin Nausea And Vomiting, Rash, Other (See Comments)   Body aches   Methocarbamol Swelling, Rash   Penicillins Swelling, Rash   Has patient had a PCN reaction causing immediate rash, facial/tongue/throat swelling, SOB or lightheadedness with hypotension: yes Has patient had a PCN reaction causing severe rash involving mucus membranes or skin necrosis: unknown Has patient had a PCN reaction that required hospitalization: no Has patient had a PCN reaction occurring within the last 10 years: No If all of the above answers are "NO", then may proceed with Cephalosporin use.        Medication List        Accurate as of January 28, 2021 10:20 AM. If you have any questions, ask your nurse or doctor.          albuterol 108 (90 Base) MCG/ACT inhaler Commonly known as: VENTOLIN HFA 2 puffs as needed   albuterol (2.5 MG/3ML) 0.083% nebulizer solution Commonly known as: PROVENTIL Take 3 mLs (2.5 mg total) by nebulization every 6 (six) hours as needed  for wheezing or shortness of breath.   alfuzosin 10 MG 24 hr tablet Commonly known as: UROXATRAL Take 1 tablet (10 mg total) by mouth daily with breakfast.   cetirizine 10 MG tablet Commonly known as: ZYRTEC Take 10 mg by mouth daily.   Combigan 0.2-0.5 % ophthalmic solution Generic drug: brimonidine-timolol Apply 1 drop to eye 2 (two) times daily.   esomeprazole 40 MG capsule Commonly known as: NEXIUM Take 40 mg by mouth daily.   fluticasone 220 MCG/ACT inhaler Commonly known as: FLOVENT HFA Inhale 2 puffs into the lungs daily. What changed: when to take this   hydroxypropyl methylcellulose / hypromellose 2.5 % ophthalmic solution Commonly known as: ISOPTO TEARS / GONIOVISC Place 1 drop into  both eyes 3 (three) times daily as needed for dry eyes.   LORazepam 0.5 MG tablet Commonly known as: ATIVAN Take 1 tablet (0.5 mg total) by mouth at bedtime.   losartan 25 MG tablet Commonly known as: COZAAR Take 1 tablet (25 mg total) by mouth daily.   lovastatin 40 MG tablet Commonly known as: MEVACOR Take 1 tablet (40 mg total) by mouth daily.   nitroGLYCERIN 0.4 MG SL tablet Commonly known as: NITROSTAT Place 1 tablet (0.4 mg total) under the tongue every 5 (five) minutes as needed for chest pain.   pseudoephedrine-guaifenesin 60-600 MG 12 hr tablet Commonly known as: MUCINEX D Take 1 tablet by mouth 2 (two) times daily as needed for congestion.   sodium chloride 0.65 % Soln nasal spray Commonly known as: OCEAN Place 1 spray into both nostrils as needed for congestion.        Allergies:  Allergies  Allergen Reactions   Beta Adrenergic Blockers Diarrhea and Other (See Comments)    Does not recall an allergy   Lasix [Furosemide] Rash   Cefzil [Cefprozil] Nausea Only    Patient does recall allergy   Dexamethasone Swelling   Gabapentin Swelling   Neomycin Other (See Comments)   Tetracyclines & Related Itching   Ciprofloxacin Nausea And Vomiting, Rash and Other  (See Comments)    Body aches    Methocarbamol Swelling and Rash   Penicillins Swelling and Rash    Has patient had a PCN reaction causing immediate rash, facial/tongue/throat swelling, SOB or lightheadedness with hypotension: yes Has patient had a PCN reaction causing severe rash involving mucus membranes or skin necrosis: unknown Has patient had a PCN reaction that required hospitalization: no Has patient had a PCN reaction occurring within the last 10 years: No If all of the above answers are "NO", then may proceed with Cephalosporin use.     Family History: Family History  Problem Relation Age of Onset   Hypertension Mother    COPD Father    Cancer Brother        brain    Social History:  reports that he quit smoking about 17 years ago. His smoking use included cigarettes. He has a 35.00 pack-year smoking history. He has never used smokeless tobacco. He reports that he does not drink alcohol and does not use drugs.  ROS: All other review of systems were reviewed and are negative except what is noted above in HPI   Laboratory Data: Lab Results  Component Value Date   WBC 6.7 09/05/2020   HGB 13.4 09/05/2020   HCT 41.3 09/05/2020   MCV 85 09/05/2020   PLT 214 09/05/2020    Lab Results  Component Value Date   CREATININE 0.82 11/05/2020    Lab Results  Component Value Date   PSA 1.80 07/26/2014   PSA 1.42 03/26/2013    No results found for: TESTOSTERONE  No results found for: HGBA1C  Urinalysis    Component Value Date/Time   COLORURINE STRAW (A) 12/28/2019 1541   APPEARANCEUR Clear 04/30/2020 1058   LABSPEC 1.005 12/28/2019 1541   PHURINE 8.0 12/28/2019 1541   GLUCOSEU Negative 04/30/2020 1058   HGBUR NEGATIVE 12/28/2019 1541   BILIRUBINUR Negative 04/30/2020 1058   KETONESUR NEGATIVE 12/28/2019 1541   PROTEINUR Negative 04/30/2020 1058   PROTEINUR NEGATIVE 12/28/2019 1541   UROBILINOGEN 2.0 03/05/2016 1544   UROBILINOGEN 0.2 12/04/2014 0240    NITRITE Negative 04/30/2020 1058   NITRITE NEGATIVE 12/28/2019 1541   LEUKOCYTESUR Negative  04/30/2020 [place] 12/28/2019 1541    Lab Results  Component Value Date   LABMICR See below: 04/30/2020   WBCUA None seen 04/30/2020   LABEPIT None seen 04/30/2020   BACTERIA None seen 04/30/2020    Pertinent Imaging:  Results for orders placed during the hospital encounter of 11/11/14  DG Abd 1 View  Narrative CLINICAL DATA:  Difficulty urinating with burning and pelvic pressure for 6 days.  EXAM: ABDOMEN - 1 VIEW  COMPARISON:  None.  FINDINGS: The bowel gas pattern is nonobstructive. Prominent stool burden in the transverse colon is noted. No unexpected abdominal calcification is seen. The patient is status post lower lumbar fusion.  IMPRESSION: No acute abnormality.   Electronically Signed By: Inge Rise M.D. On: 11/11/2014 17:46  Results for orders placed in visit on 12/06/14  US Venous Img Lower Bilateral  Narrative CLINICAL DATA:  One month post L5-S1 fusion, presenting with bilateral lower extremity edema and elevated D-dimer.  EXAM: BILATERAL LOWER EXTREMITY VENOUS DOPPLER ULTRASOUND  TECHNIQUE: Gray-scale sonography with graded compression, as well as color Doppler and duplex ultrasound were performed to evaluate the lower extremity deep venous systems from the level of the common femoral vein and including the common femoral, femoral, profunda femoral, popliteal and calf veins including the posterior tibial, peroneal and gastrocnemius veins when visible. The superficial great saphenous vein was also interrogated. Spectral Doppler was utilized to evaluate flow at rest and with distal augmentation maneuvers in the common femoral, femoral and popliteal veins.  COMPARISON:  None.  FINDINGS: RIGHT LOWER EXTREMITY  Common Femoral Vein: No evidence of thrombus. Normal compressibility, respiratory phasicity and response to  augmentation.  Saphenofemoral Junction: No evidence of thrombus. Normal compressibility and flow on color Doppler imaging.  Profunda Femoral Vein: No evidence of thrombus. Normal compressibility and flow on color Doppler imaging.  Femoral Vein: No evidence of thrombus. Normal compressibility, respiratory phasicity and response to augmentation.  Popliteal Vein: No evidence of thrombus. Normal compressibility, respiratory phasicity and response to augmentation.  Calf Veins: No evidence of thrombus. Normal compressibility and flow on color Doppler imaging.  Superficial Great Saphenous Vein: No evidence of thrombus. Normal compressibility and flow on color Doppler imaging.  Venous Reflux:  Not evaluated.  Other Findings:  None.  LEFT LOWER EXTREMITY  Common Femoral Vein: No evidence of thrombus. Normal compressibility, respiratory phasicity and response to augmentation.  Saphenofemoral Junction: No evidence of thrombus. Normal compressibility and flow on color Doppler imaging.  Profunda Femoral Vein: No evidence of thrombus. Normal compressibility and flow on color Doppler imaging.  Femoral Vein: No evidence of thrombus. Normal compressibility, respiratory phasicity and response to augmentation.  Popliteal Vein: No evidence of thrombus. Normal compressibility, respiratory phasicity and response to augmentation.  Calf Veins: No evidence of thrombus. Normal compressibility and flow on color Doppler imaging.  Superficial Great Saphenous Vein: No evidence of thrombus. Normal compressibility and flow on color Doppler imaging.  Venous Reflux:  Not evaluated.  Other Findings:  None.  IMPRESSION: No evidence of DVT involving the right or left lower extremity.   Electronically Signed By: Evangeline Dakin M.D. On: 12/06/2014 17:15  No results found for this or any previous visit.  No results found for this or any previous visit.  No results found for this or any  previous visit.  No results found for this or any previous visit.  No results found for this or any previous visit.  No results found for this or any  previous visit.   Assessment & Plan:    1. BPH associated with nocturia -Continue uroxatral '10mg'$  daily  2. Nocturia -Continue uroxatral '10mg'$  daily   No follow-ups on file.  Nicolette Bang, MD  [facility] [place]

## 2021-01-30 ENCOUNTER — Encounter (HOSPITAL_COMMUNITY): Payer: Self-pay

## 2021-01-30 ENCOUNTER — Ambulatory Visit (HOSPITAL_COMMUNITY): Payer: Medicare Other

## 2021-02-11 ENCOUNTER — Other Ambulatory Visit: Payer: Self-pay

## 2021-02-11 ENCOUNTER — Ambulatory Visit (HOSPITAL_COMMUNITY)
Admission: RE | Admit: 2021-02-11 | Discharge: 2021-02-11 | Disposition: A | Discharge status: Home / Self Care | Payer: Medicare Other | Source: Ambulatory Visit | Attending: Cardiovascular Disease | Admitting: Cardiovascular Disease

## 2021-02-11 DIAGNOSIS — I714 Abdominal aortic aneurysm, without rupture, unspecified: Secondary | ICD-10-CM

## 2021-02-17 ENCOUNTER — Other Ambulatory Visit: Payer: Self-pay | Admitting: Family Medicine

## 2021-02-17 DIAGNOSIS — G4700 Insomnia, unspecified: Secondary | ICD-10-CM

## 2021-02-17 NOTE — Telephone Encounter (Signed)
Sent my chart message 10/4

## 2021-02-19 ENCOUNTER — Other Ambulatory Visit: Payer: Self-pay

## 2021-02-19 ENCOUNTER — Ambulatory Visit (INDEPENDENT_AMBULATORY_CARE_PROVIDER_SITE_OTHER): Payer: Medicare Other | Admitting: Podiatry

## 2021-02-19 ENCOUNTER — Encounter: Payer: Self-pay | Admitting: Podiatry

## 2021-02-19 DIAGNOSIS — L600 Ingrowing nail: Secondary | ICD-10-CM

## 2021-02-19 NOTE — Patient Instructions (Signed)

## 2021-02-22 NOTE — Progress Notes (Signed)
Subjective:   Patient ID: [name] [name], male   DOB: 80 y.o.   MRN: [id number]   HPI Patient presents stating the ingrown on my left big toe is really bothering me and the trimmings are not making a big difference.  Patient states that it gets sore and makes it hard for him to wear shoe gear comfortably and states that he would like something more permanent done to try to help with the right also hurting some but not to the same degree   ROS      Objective:  Physical Exam  Neurovascular status was found to be intact muscle strength is found to be within normal limits and I did noted good digital perfusion.  The lateral border of the left hallux is quite incurvated and sore but I did not note that there was any drainage or erythema associated with it     Assessment:  Chronic ingrown toenail deformity that is not responded to treatment or soaks and is sore and make shoe gear difficult     Plan:  H&P reviewed condition and recommended removal of the nail border.  I explained procedure risk and patient wants surgery understanding risk and today went ahead and I infiltrated the left hallux 60 mg like Marcaine mixture sterile prepped and using sterile instrumentation I remove the lateral border exposed matrix applied phenol 3 applications 30 seconds followed by alcohol lavage sterile dressing and I gave instructions on soaks.  Patient is encouraged to leave dressing on 24 hours but take it off earlier if throbbing were to occur and will call with any issues questions were to arise

## 2021-02-25 ENCOUNTER — Other Ambulatory Visit: Payer: Medicare Other

## 2021-02-25 NOTE — Telephone Encounter (Signed)
Second my chart message and phone call 10/12

## 2021-03-02 ENCOUNTER — Telehealth: Payer: Self-pay | Admitting: Pulmonary Disease

## 2021-03-02 NOTE — Telephone Encounter (Signed)
AO please advise if you are ok with taking over the refills for the pts albuterol nebulizer medication.  Thanks

## 2021-03-03 MED ORDER — ALBUTEROL SULFATE (2.5 MG/3ML) 0.083% IN NEBU: 2.5000 mg | INHALATION_SOLUTION | Freq: Four times a day (QID) | RESPIRATORY_TRACT | 2 refills | Status: DC | PRN

## 2021-03-03 NOTE — Telephone Encounter (Signed)
I have called the pt and he is aware that AO is ok with taking over this rx for the nebuliZer meds.  I have sent the rx to the pharmacy to have on hold.  Nothing further is needed.

## 2021-03-03 NOTE — Telephone Encounter (Signed)
That is fine with me, we can refill them as needed

## 2021-03-04 DIAGNOSIS — H401111 Primary open-angle glaucoma, right eye, mild stage: Secondary | ICD-10-CM | POA: Diagnosis not present

## 2021-03-04 DIAGNOSIS — Z961 Presence of intraocular lens: Secondary | ICD-10-CM | POA: Diagnosis not present

## 2021-03-04 DIAGNOSIS — H401122 Primary open-angle glaucoma, left eye, moderate stage: Secondary | ICD-10-CM | POA: Diagnosis not present

## 2021-03-04 DIAGNOSIS — H0102B Squamous blepharitis left eye, upper and lower eyelids: Secondary | ICD-10-CM | POA: Diagnosis not present

## 2021-03-04 DIAGNOSIS — H0102A Squamous blepharitis right eye, upper and lower eyelids: Secondary | ICD-10-CM | POA: Diagnosis not present

## 2021-03-04 DIAGNOSIS — H04123 Dry eye syndrome of bilateral lacrimal glands: Secondary | ICD-10-CM | POA: Diagnosis not present

## 2021-03-04 DIAGNOSIS — H1045 Other chronic allergic conjunctivitis: Secondary | ICD-10-CM | POA: Diagnosis not present

## 2021-03-04 DIAGNOSIS — H3581 Retinal edema: Secondary | ICD-10-CM | POA: Diagnosis not present

## 2021-03-04 DIAGNOSIS — Z9889 Other specified postprocedural states: Secondary | ICD-10-CM | POA: Diagnosis not present

## 2021-03-04 DIAGNOSIS — H43392 Other vitreous opacities, left eye: Secondary | ICD-10-CM | POA: Diagnosis not present

## 2021-03-05 DIAGNOSIS — L853 Xerosis cutis: Secondary | ICD-10-CM | POA: Diagnosis not present

## 2021-03-05 DIAGNOSIS — C44319 Basal cell carcinoma of skin of other parts of face: Secondary | ICD-10-CM | POA: Diagnosis not present

## 2021-03-05 DIAGNOSIS — L708 Other acne: Secondary | ICD-10-CM | POA: Diagnosis not present

## 2021-03-12 ENCOUNTER — Telehealth: Payer: Self-pay | Admitting: Pulmonary Disease

## 2021-03-12 NOTE — Telephone Encounter (Signed)
Spoke with the pt  He is c/o cough with yellow sputum for the past wk  He also reports having more SOB than usual despite taking all meds as directed  He denies fever or aches  He has taken at home covid test and this was neg  OV with TP at 11 am tomorrow

## 2021-03-13 ENCOUNTER — Encounter: Payer: Self-pay | Admitting: Adult Health

## 2021-03-13 ENCOUNTER — Ambulatory Visit (INDEPENDENT_AMBULATORY_CARE_PROVIDER_SITE_OTHER): Payer: Medicare Other | Admitting: Adult Health

## 2021-03-13 ENCOUNTER — Other Ambulatory Visit: Payer: Self-pay

## 2021-03-13 DIAGNOSIS — J441 Chronic obstructive pulmonary disease with (acute) exacerbation: Secondary | ICD-10-CM

## 2021-03-13 DIAGNOSIS — I2510 Atherosclerotic heart disease of native coronary artery without angina pectoris: Secondary | ICD-10-CM | POA: Diagnosis not present

## 2021-03-13 MED ORDER — AZITHROMYCIN 250 MG PO TABS: ORAL_TABLET | ORAL | 0 refills | Status: AC

## 2021-03-13 MED ORDER — PREDNISONE 10 MG PO TABS: ORAL_TABLET | ORAL | 0 refills | Status: DC

## 2021-03-13 NOTE — Progress Notes (Signed)
@Patient  ID: [name] [name], male    DOB: [birth year], 80 y.o.   MRN: [id number]  Chief Complaint  Patient presents with   Acute Visit    Referring provider: Erven Colla, DO  HPI: 80 year old male former smoker followed for COPD with chronic bronchitis and emphysema  TEST/EVENTS :  pulmonary function testing done November 05, 2019 that showed severe airflow obstruction with FEV1 at 47%, ratio 61, FVC 56%, no significant bronchodilator response, mid flow obstruction with significant reversibility and normal DLCO.  03/13/2021 Acute OV : COPD  Patient presents for an acute office visit.  Patient complains over the last 2 weeks he has had increased cough, congestion, shortness of breath.  Patient says has been taken his albuterol inhaler with some relief.  He denies any hemoptysis, fever, chest pain.  Took 2 home COVID test that were both negative.  Patient remains on Flovent twice daily.  Patient says overall has been doing well up until the last 2 weeks.  Is not taking any over-the-counter cold medicines. Has been tried on Dulera and Anoro in the past.   Allergies  Allergen Reactions   Beta Adrenergic Blockers Diarrhea and Other (See Comments)    Does not recall an allergy   Lasix [Furosemide] Rash   Cefzil [Cefprozil] Nausea Only    Patient does recall allergy   Dexamethasone Swelling   Gabapentin Swelling   Neomycin Other (See Comments)   Tetracyclines & Related Itching   Ciprofloxacin Nausea And Vomiting, Rash and Other (See Comments)    Body aches    Methocarbamol Swelling and Rash   Penicillins Swelling and Rash    Has patient had a PCN reaction causing immediate rash, facial/tongue/throat swelling, SOB or lightheadedness with hypotension: yes Has patient had a PCN reaction causing severe rash involving mucus membranes or skin necrosis: unknown Has patient had a PCN reaction that required hospitalization: no Has patient had a PCN reaction occurring within the last 10  years: No If all of the above answers are "NO", then may proceed with Cephalosporin use.     Immunization History  Administered Date(s) Administered   Influenza,inj,Quad PF,6+ Mos 03/22/2014, 04/21/2015, 02/13/2019   Influenza-Unspecified 02/18/2017, 03/23/2018, 04/07/2020   Moderna Sars-Covid-2 Vaccination 06/20/2019, 07/18/2019, 04/14/2020   Pneumococcal Conjugate-13 09/24/2016   Pneumococcal Polysaccharide-23 05/08/2013   Pneumococcal-Unspecified 10/24/2006   Zoster Recombinat (Shingrix) 08/26/2016, 02/05/2017    Past Medical History:  Diagnosis Date   AAA (abdominal aortic aneurysm)    needs yearly ultrasound   Allergy    Anemia    Arthritis    Asthma    BCC (basal cell carcinoma) 08/18/1989   left shoulder blad, upper right arm, left arm beyond elbow, c&d   BCC (basal cell carcinoma) 01/31/1992   Posterior neck, curetx3, 14fu   BCC (basal cell carcinoma) 11/22/2001   mid forehead, cx3, excision, right forearm, cx3, 14fu   BCC (basal cell carcinoma) 10/09/2003   mid forehead, MOHs   BCC (basal cell carcinoma) 08/15/2008   upper left back, biopsy   BPH (benign prostatic hyperplasia)    CAD (coronary artery disease)    Cancer (HCC)    skin cancer   COPD (chronic obstructive pulmonary disease) ([place])    Dysrhythmia    pt. states it can be fast at times   GERD (gastroesophageal reflux disease)    Glaucoma    HOH (hard of hearing)    Hypercholesterolemia    Hypertension    Impaired fasting glucose    Low  back pain    Melanoma in situ ([place]) 10/09/2003   left chin, MOHs   MI (myocardial infarction) ([place]) 1999   SCC (squamous cell carcinoma) 07/03/2014   in situ, behind left ear, cx3, cautery, 93fu   SCC (squamous cell carcinoma) 07/03/2014   well diff, left forearm, biopsy, cx1, cautery   SCC (squamous cell carcinoma) 07/20/2017   in situ, left upper arm, cx3, 39fu   SCC (squamous cell carcinoma) 01/10/2019   in situ, left post shoulder, cx3, 21fu   SCC (squamous  cell carcinoma) 11/22/2001   left forearm distal, left forearm, cx3, 41fu   SCC (squamous cell carcinoma) 10/09/2003   [place], left ear post, clear per st, right cheek clear   SCC (squamous cell carcinoma) 03/30/2004   in situ, left upper arm, cx3, 83fu   SCC (squamous cell carcinoma) 03/08/2005   in situ, right cheek, mid upper forehead, cx3, 48fu   SCC (squamous cell carcinoma) 06/08/2006   in situ, left shoulder, cx3, 35fu   SCC (squamous cell carcinoma) 05/05/2010   right inner wrist, biopsy   SCC (squamous cell carcinoma) 09/13/2013   in situ, right crown scalp, front scalp, biopsy   Thrush     Tobacco History: Social History   Tobacco Use  Smoking Status Former   Packs/day: 1.00   Years: 35.00   Pack years: 35.00   Types: Cigarettes   Quit date: 03/28/2003   Years since quitting: 17.9  Smokeless Tobacco Never   Counseling given: Not Answered   Outpatient Medications Prior to Visit  Medication Sig Dispense Refill   albuterol (PROVENTIL) (2.5 MG/3ML) 0.083% nebulizer solution Take 3 mLs (2.5 mg total) by nebulization every 6 (six) hours as needed for wheezing or shortness of breath. 100 mL 2   albuterol (VENTOLIN HFA) 108 (90 Base) MCG/ACT inhaler 2 puffs as needed     alfuzosin (UROXATRAL) 10 MG 24 hr tablet Take 1 tablet (10 mg total) by mouth daily with breakfast. 90 tablet 3   cetirizine (ZYRTEC) 10 MG tablet Take 10 mg by mouth daily.     COMBIGAN 0.2-0.5 % ophthalmic solution Apply 1 drop to eye 2 (two) times daily.     esomeprazole (NEXIUM) 40 MG capsule Take 40 mg by mouth daily.     fluticasone (FLOVENT HFA) 220 MCG/ACT inhaler Inhale 2 puffs into the lungs daily. (Patient taking differently: Inhale 2 puffs into the lungs in the morning and at bedtime.) 12 g 5   guaiFENesin (MUCINEX) 600 MG 12 hr tablet Take 1,200 mg by mouth daily.     hydroxypropyl methylcellulose / hypromellose (ISOPTO TEARS / GONIOVISC) 2.5 % ophthalmic solution Place 1 drop into both eyes 3  (three) times daily as needed for dry eyes.     LORazepam (ATIVAN) 0.5 MG tablet TAKE 1 TABLET BY MOUTH ONCE DAILY AT BEDTIME. 30 tablet 0   losartan (COZAAR) 25 MG tablet Take 1 tablet (25 mg total) by mouth daily. 90 tablet 1   lovastatin (MEVACOR) 40 MG tablet Take 1 tablet (40 mg total) by mouth daily. 90 tablet 1   nitroGLYCERIN (NITROSTAT) 0.4 MG SL tablet Place 1 tablet (0.4 mg total) under the tongue every 5 (five) minutes as needed for chest pain. 25 tablet 5   pseudoephedrine-guaifenesin (MUCINEX D) 60-600 MG 12 hr tablet Take 1 tablet by mouth 2 (two) times daily as needed for congestion.     sodium chloride (OCEAN) 0.65 % SOLN nasal spray Place 1 spray into both nostrils as  needed for congestion.     No facility-administered medications prior to visit.     Review of Systems:   Constitutional:   No  weight loss, night sweats,  Fevers, chills,  +fatigue, or  lassitude.  HEENT:   No headaches,  Difficulty swallowing,  Tooth/dental problems, or  Sore throat,                No sneezing, itching, ear ache, nasal congestion, post nasal drip,   CV:  No chest pain,  Orthopnea, PND, swelling in lower extremities, anasarca, dizziness, palpitations, syncope.   GI  No heartburn, indigestion, abdominal pain, nausea, vomiting, diarrhea, change in bowel habits, loss of appetite, bloody stools.   Resp:  No chest wall deformity  Skin: no rash or lesions.  GU: no dysuria, change in color of urine, no urgency or frequency.  No flank pain, no hematuria   MS:  No joint pain or swelling.  No decreased range of motion.  No back pain.    Physical Exam  BP 106/60 (BP Location: Left Arm, Patient Position: Sitting, Cuff Size: Normal)   Pulse 83   Temp 98.1 F (36.7 C) (Oral)   Ht 6\' 1"  (1.854 m)   Wt 199 lb 3.2 oz (90.4 kg)   SpO2 99%   BMI 26.28 kg/m   GEN: A/Ox3; pleasant , NAD, well nourished    HEENT:  [place]/AT,  NOSE-clear, THROAT-clear, no lesions, no postnasal drip or exudate  noted.   NECK:  Supple w/ fair ROM; no JVD; normal carotid impulses w/o bruits; no thyromegaly or nodules palpated; no lymphadenopathy.    RESP few trace rhonchi  no accessory muscle use, no dullness to percussion  CARD:  RRR, no m/r/g, no peripheral edema, pulses intact, no cyanosis or clubbing.  GI:   Soft & nt; nml bowel sounds; no organomegaly or masses detected.   Musco: Warm bil, no deformities or joint swelling noted.   Neuro: alert, no focal deficits noted.    Skin: Warm, no lesions or rashes    Lab Results:        ProBNP No results found for: PROBNP  Imaging: No results found.    PFT Results Latest Ref Rng & Units 11/05/2019  FVC-Pre L 2.80  FVC-Predicted Pre % 56  FVC-Post L 2.82  FVC-Predicted Post % 56  Pre FEV1/FVC % % 56  Post FEV1/FCV % % 61  FEV1-Pre L 1.58  FEV1-Predicted Pre % 43  FEV1-Post L 1.72  DLCO uncorrected ml/min/mmHg 32.71  DLCO UNC% % 114  DLCO corrected ml/min/mmHg 32.71  DLCO COR %Predicted % 114  DLVA Predicted % 136    No results found for: NITRICOXIDE      Assessment & Plan:   COPD exacerbation ([place]) Acute COPD exacerbation  Plan  Patient Instructions  Begin Zpack take as directed.  Prednisone taper over next week  Mucinex DM Twice daily  As needed  cough/congestion  Continue on Flovent 2 puffs Twice daily  , rinse after use .  Follow up with Dr. Ander Slade in 3 months and As needed   Please contact office for sooner follow up if symptoms do not improve or worsen or seek emergency care       I spent  30  minutes dedicated to the care of this patient on the date of this encounter to include pre-visit review of records, face-to-face time with the patient discussing conditions above, post visit ordering of testing, clinical documentation with the electronic health record,  making appropriate referrals as documented, and communicating necessary findings to members of the patients care team.    Rexene Edison,  NP 03/13/2021

## 2021-03-13 NOTE — Patient Instructions (Addendum)
Begin Zpack take as directed.  Prednisone taper over next week  Mucinex DM Twice daily  As needed  cough/congestion  Continue on Flovent 2 puffs Twice daily  , rinse after use .  Follow up with Dr. Ander Slade in 3 months and As needed   Please contact office for sooner follow up if symptoms do not improve or worsen or seek emergency care

## 2021-03-13 NOTE — Assessment & Plan Note (Signed)
Acute COPD exacerbation  Plan  Patient Instructions  Begin Zpack take as directed.  Prednisone taper over next week  Mucinex DM Twice daily  As needed  cough/congestion  Continue on Flovent 2 puffs Twice daily  , rinse after use .  Follow up with Dr. Ander Slade in 3 months and As needed   Please contact office for sooner follow up if symptoms do not improve or worsen or seek emergency care

## 2021-03-20 DIAGNOSIS — Z23 Encounter for immunization: Secondary | ICD-10-CM | POA: Diagnosis not present

## 2021-04-01 ENCOUNTER — Telehealth: Payer: Self-pay | Admitting: Pulmonary Disease

## 2021-04-01 MED ORDER — FLUTICASONE PROPIONATE HFA 220 MCG/ACT IN AERO: 2.0000 | INHALATION_SPRAY | Freq: Two times a day (BID) | RESPIRATORY_TRACT | 5 refills | Status: DC

## 2021-04-01 NOTE — Telephone Encounter (Signed)
Refill has been sent to the pharmacy per pts request.  

## 2021-04-03 DIAGNOSIS — Z23 Encounter for immunization: Secondary | ICD-10-CM | POA: Diagnosis not present

## 2021-04-13 DIAGNOSIS — C44319 Basal cell carcinoma of skin of other parts of face: Secondary | ICD-10-CM | POA: Diagnosis not present

## 2021-04-13 DIAGNOSIS — Z85828 Personal history of other malignant neoplasm of skin: Secondary | ICD-10-CM | POA: Diagnosis not present

## 2021-04-13 DIAGNOSIS — L718 Other rosacea: Secondary | ICD-10-CM | POA: Diagnosis not present

## 2021-04-13 DIAGNOSIS — Z08 Encounter for follow-up examination after completed treatment for malignant neoplasm: Secondary | ICD-10-CM | POA: Diagnosis not present

## 2021-04-17 DIAGNOSIS — J019 Acute sinusitis, unspecified: Secondary | ICD-10-CM | POA: Diagnosis not present

## 2021-04-23 ENCOUNTER — Ambulatory Visit (INDEPENDENT_AMBULATORY_CARE_PROVIDER_SITE_OTHER): Payer: Medicare Other | Admitting: Family Medicine

## 2021-04-23 ENCOUNTER — Other Ambulatory Visit: Payer: Self-pay

## 2021-04-23 VITALS — BP 130/84 | HR 74 | Temp 98.3°F | Ht 73.0 in | Wt 199.8 lb

## 2021-04-23 DIAGNOSIS — E782 Mixed hyperlipidemia: Secondary | ICD-10-CM | POA: Diagnosis not present

## 2021-04-23 DIAGNOSIS — R739 Hyperglycemia, unspecified: Secondary | ICD-10-CM | POA: Diagnosis not present

## 2021-04-23 DIAGNOSIS — I2510 Atherosclerotic heart disease of native coronary artery without angina pectoris: Secondary | ICD-10-CM | POA: Diagnosis not present

## 2021-04-23 DIAGNOSIS — I714 Abdominal aortic aneurysm, without rupture, unspecified: Secondary | ICD-10-CM | POA: Diagnosis not present

## 2021-04-23 DIAGNOSIS — I10 Essential (primary) hypertension: Secondary | ICD-10-CM | POA: Diagnosis not present

## 2021-04-23 DIAGNOSIS — J449 Chronic obstructive pulmonary disease, unspecified: Secondary | ICD-10-CM | POA: Insufficient documentation

## 2021-04-23 DIAGNOSIS — R040 Epistaxis: Secondary | ICD-10-CM

## 2021-04-23 DIAGNOSIS — N400 Enlarged prostate without lower urinary tract symptoms: Secondary | ICD-10-CM | POA: Insufficient documentation

## 2021-04-23 NOTE — Progress Notes (Signed)
Subjective:  Patient ID: [name] [name], male    DOB: [birth year]  Age: 80 y.o. MRN: [id number]  CC: Chief Complaint  Patient presents with   Hypertension   Hyperlipidemia    Follow up Patient having nasal congestion with nose bleed.    HPI:  80 year old male with hypertension, COPD, hyperlipidemia, CAD, AAA presents for follow-up.  Hypertension Patient's blood pressure is at goal.  He is compliant with losartan.  However side effects.  No concerns regarding this at this time.  Frequent nosebleeds Patient states that he has a prior history of frequent nosebleeds which resulted in hospitalization previously. For the past few days he has had nosebleeds daily.  Resolved with Afrin and placing cotton balls in his nostrils. Patient is concerned given recurrence and the fact that he has had to see ENT previously for cauterization. He would like to discuss interventions today.  Hyperlipidemia Most recent lipid panel revealed an LDL of 78.  He is goal is less than 70 given CAD.  Needs laboratory studies.  He is currently on lovastatin and tolerating well.  AAA Last CT was in July.  He has follow-up in February with vascular.   Patient Active Problem List   Diagnosis Date Noted   COPD (chronic obstructive pulmonary disease) ([place]) 04/23/2021   BPH (benign prostatic hyperplasia) 04/23/2021   CAD (coronary artery disease) 04/23/2021   Frequent nosebleeds 04/23/2021   HOH (hard of hearing)    HTN (hypertension) 09/16/2016   Lumbar spondylosis 11/05/2014   GERD (gastroesophageal reflux disease) 01/19/2012   Mixed hyperlipidemia 04/15/2008   AAA (abdominal aortic aneurysm) 04/15/2008    Social Hx   Social History   Socioeconomic History   Marital status: Married    Spouse name: Not on file   Number of children: Not on file   Years of education: Not on file   Highest education level: Not on file  Occupational History   Not on file  Tobacco Use   Smoking status: Former     Packs/day: 1.00    Years: 35.00    Pack years: 35.00    Types: Cigarettes    Quit date: 03/28/2003    Years since quitting: 18.0   Smokeless tobacco: Never  Vaping Use   Vaping Use: Never used  Substance and Sexual Activity   Alcohol use: No    Alcohol/week: 0.0 standard drinks   Drug use: No   Sexual activity: Not Currently    Birth control/protection: None  Other Topics Concern   Not on file  Social History Narrative   Not on file   Social Determinants of Health   Financial Resource Strain: Not on file  Food Insecurity: Not on file  Transportation Needs: Not on file  Physical Activity: Not on file  Stress: Not on file  Social Connections: Not on file    Review of Systems  HENT:  Positive for nosebleeds.   Respiratory:  Positive for shortness of breath.     Objective:  BP 130/84   Pulse 74   Temp 98.3 F (36.8 C) (Oral)   Ht 6' 1"  (1.854 m)   Wt 199 lb 12.8 oz (90.6 kg)   SpO2 96%   BMI 26.36 kg/m   BP/Weight 04/23/2021 03/13/2021 8/93/7342  Systolic BP 876 811 572  Diastolic BP 84 60 70  Wt. (Lbs) 199.8 199.2 200.4  BMI 26.36 26.28 26.44    Physical Exam Vitals and nursing note reviewed.  Constitutional:      Appearance:  Normal appearance.  HENT:     Head: Normocephalic and atraumatic.     Nose: Congestion present.  Eyes:     General:        Right eye: No discharge.        Left eye: No discharge.     Conjunctiva/sclera: Conjunctivae normal.  Cardiovascular:     Rate and Rhythm: Normal rate and regular rhythm.  Pulmonary:     Effort: Pulmonary effort is normal.     Breath sounds: Normal breath sounds. No wheezing.  Abdominal:     General: There is no distension.     Palpations: Abdomen is soft.     Tenderness: There is no abdominal tenderness.  Neurological:     Mental Status: He is alert.  Psychiatric:        Mood and Affect: Mood normal.        Behavior: Behavior normal.    Lab Results  Component Value Date   WBC 6.7 09/05/2020    HGB 13.4 09/05/2020   HCT 41.3 09/05/2020   PLT 214 09/05/2020   GLUCOSE 107 (H) 11/05/2020   CHOL 129 09/05/2020   TRIG 70 09/05/2020   HDL 37 (L) 09/05/2020   LDLCALC 78 09/05/2020   ALT 11 09/05/2020   AST 14 09/05/2020   NA 135 11/05/2020   K 4.4 11/05/2020   CL 101 11/05/2020   CREATININE 0.82 11/05/2020   BUN 13 11/05/2020   CO2 28 11/05/2020   TSH 1.230 02/14/2019   PSA 1.80 07/26/2014     Assessment & Plan:   Problem List Items Addressed This Visit       Cardiovascular and Mediastinum   AAA (abdominal aortic aneurysm)    Advised close follow-up with vascular.  Has an appointment in February.      HTN (hypertension) - Primary    At goal.  Continue losartan.      Relevant Orders   CMP14+EGFR     Other   Mixed hyperlipidemia    Labs today for further evaluation.  May need increase or change in therapy.  Goal less than 70.      Relevant Orders   Lipid panel   Frequent nosebleeds    Advised to continue compression and use of Afrin at the onset of nosebleed.  Placing urgent referral to ENT.      Relevant Orders   Ambulatory referral to ENT   Other Visit Diagnoses     Hyperglycemia       Relevant Orders   Hemoglobin A1c       Follow-up:  6 months.  [place]

## 2021-04-23 NOTE — Patient Instructions (Signed)
Nasal saline twice daily.  Afrin and pressure for nosebleeds. I have placed the referral.  Appt with vascular 06/17/21.   Labs today.  Call with concerns.  Follow up in 6 months or sooner if needed.  Take care  Dr. Lacinda Axon

## 2021-04-23 NOTE — Assessment & Plan Note (Signed)
At goal.  Continue losartan. 

## 2021-04-23 NOTE — Assessment & Plan Note (Signed)
Labs today for further evaluation.  May need increase or change in therapy.  Goal less than 70.

## 2021-04-23 NOTE — Assessment & Plan Note (Signed)
Advised close follow-up with vascular.  Has an appointment in February.

## 2021-04-23 NOTE — Assessment & Plan Note (Signed)
Advised to continue compression and use of Afrin at the onset of nosebleed.  Placing urgent referral to ENT.

## 2021-04-24 LAB — CMP14+EGFR
ALT: 14 IU/L (ref 0–44)
AST: 18 IU/L (ref 0–40)
Albumin/Globulin Ratio: 1.5 (ref 1.2–2.2)
Albumin: 4.4 g/dL (ref 3.7–4.7)
Alkaline Phosphatase: 83 IU/L (ref 44–121)
BUN/Creatinine Ratio: 13 (ref 10–24)
BUN: 12 mg/dL (ref 8–27)
Bilirubin Total: 0.5 mg/dL (ref 0.0–1.2)
CO2: 26 mmol/L (ref 20–29)
Calcium: 9.2 mg/dL (ref 8.6–10.2)
Chloride: 102 mmol/L (ref 96–106)
Creatinine, Ser: 0.92 mg/dL (ref 0.76–1.27)
Globulin, Total: 2.9 g/dL (ref 1.5–4.5)
Glucose: 101 mg/dL — ABNORMAL HIGH (ref 70–99)
Potassium: 4.7 mmol/L (ref 3.5–5.2)
Sodium: 142 mmol/L (ref 134–144)
Total Protein: 7.3 g/dL (ref 6.0–8.5)
eGFR: 85 mL/min/{1.73_m2} (ref >59)

## 2021-04-24 LAB — LIPID PANEL
Chol/HDL Ratio: 3.2 ratio (ref 0.0–5.0)
Cholesterol, Total: 124 mg/dL (ref 100–199)
HDL: 39 mg/dL — ABNORMAL LOW (ref >39)
LDL Chol Calc (NIH): 65 mg/dL (ref 0–99)
Triglycerides: 109 mg/dL (ref 0–149)
VLDL Cholesterol Cal: 20 mg/dL (ref 5–40)

## 2021-04-24 LAB — HEMOGLOBIN A1C
Est. average glucose Bld gHb Est-mCnc: 128 mg/dL
Hgb A1c MFr Bld: 6.1 % — ABNORMAL HIGH (ref 4.8–5.6)

## 2021-04-28 DIAGNOSIS — R040 Epistaxis: Secondary | ICD-10-CM | POA: Diagnosis not present

## 2021-04-29 DIAGNOSIS — I10 Essential (primary) hypertension: Secondary | ICD-10-CM | POA: Diagnosis not present

## 2021-04-29 DIAGNOSIS — Z6826 Body mass index (BMI) 26.0-26.9, adult: Secondary | ICD-10-CM | POA: Diagnosis not present

## 2021-04-29 DIAGNOSIS — M5136 Other intervertebral disc degeneration, lumbar region: Secondary | ICD-10-CM | POA: Diagnosis not present

## 2021-04-29 DIAGNOSIS — M4316 Spondylolisthesis, lumbar region: Secondary | ICD-10-CM | POA: Diagnosis not present

## 2021-04-30 ENCOUNTER — Other Ambulatory Visit: Payer: Self-pay | Admitting: Neurosurgery

## 2021-04-30 DIAGNOSIS — M5136 Other intervertebral disc degeneration, lumbar region: Secondary | ICD-10-CM

## 2021-05-01 ENCOUNTER — Telehealth: Payer: Self-pay | Admitting: Pulmonary Disease

## 2021-05-01 MED ORDER — ALBUTEROL SULFATE (2.5 MG/3ML) 0.083% IN NEBU: 2.5000 mg | INHALATION_SOLUTION | Freq: Four times a day (QID) | RESPIRATORY_TRACT | 2 refills | Status: DC | PRN

## 2021-05-01 NOTE — Progress Notes (Signed)
Patient ID: AGRON SWINEY, male   DOB: [birth year], 80 y.o.   MRN: [id number]      80 y.o. history of AAA, CAD pancreatitis PCI RCA 1999 with low risk myovue 2018. Clinically stable Intolerant to beta blockers with diarrhea . AAA 4.6 cm by [place] 05/12/20 but now 5.1 cm on US/CT July 2022 Has seen Dr Donnetta Hutching ? Need for EVAR   Wife passed a few  years ago of Pancreatic Cancer and only lasted 6 weeks form diagnosis Has daughter in [place]    02/11/20 had recurrent right posterior epistaxis requiring endoscopic control and cauterization   Had right hernia surgery with Dr Arnoldo Morale 03/28/20  Still with some pain/swelling  Had idiopathic pancreatitis this year Not a drinker has had GB removed Seen by Dr Paulita Fujita no etiology noted  No angina or cardiac complaints Former smoker quit in 2014 Doing cardiopulmonary rehab 2x/ week Has seen Dr Melvyn Novas in past CXR 05/26/20 NAD With Emphysema   He is active with some dyspnea from COPD    ROS: Denies fever, malais, weight loss, blurry vision, decreased visual acuity, cough, sputum, SOB, hemoptysis, pleuritic pain, palpitaitons, heartburn, abdominal pain, melena, lower extremity edema, claudication, or rash.  All other systems reviewed and negative  General: BP 100/68 (BP Location: Left Arm, Patient Position: Sitting, Cuff Size: Normal)    Pulse 76    Ht 6\' 1"  (1.854 m)    Wt 199 lb 4 oz (90.4 kg)    SpO2 98%    BMI 26.29 kg/m   Affect appropriate Healthy:  appears stated age HEENT: hard of hearing Aides in place  Neck supple with no adenopathy JVP normal no bruits no thyromegaly Lungs Mild exp wheezing and good diaphragmatic motion Heart:  S1/S2 no murmur, no rub, gallop or click PMI normal Abdomen: benighn, BS positve, no tenderness, AAA palpable not tender  no bruit.  No HSM or HJR Post right hernia surgery repair but persistent  Umbilical hernia  Distal pulses intact with no bruits No edema Neuro non-focal Skin warm and dry No muscular  weakness   Current Outpatient Medications  Medication Sig Dispense Refill   albuterol (PROVENTIL) (2.5 MG/3ML) 0.083% nebulizer solution Take 3 mLs (2.5 mg total) by nebulization every 6 (six) hours as needed for wheezing or shortness of breath. 100 mL 2   albuterol (VENTOLIN HFA) 108 (90 Base) MCG/ACT inhaler 2 puffs as needed     alfuzosin (UROXATRAL) 10 MG 24 hr tablet Take 1 tablet (10 mg total) by mouth daily with breakfast. 90 tablet 3   cetirizine (ZYRTEC) 10 MG tablet Take 10 mg by mouth daily.     COMBIGAN 0.2-0.5 % ophthalmic solution Apply 1 drop to eye 2 (two) times daily.     esomeprazole (NEXIUM) 40 MG capsule Take 40 mg by mouth daily.     fluticasone (FLOVENT HFA) 220 MCG/ACT inhaler Inhale 2 puffs into the lungs in the morning and at bedtime. 12 g 5   hydroxypropyl methylcellulose / hypromellose (ISOPTO TEARS / GONIOVISC) 2.5 % ophthalmic solution Place 1 drop into both eyes 3 (three) times daily as needed for dry eyes.     LORazepam (ATIVAN) 0.5 MG tablet TAKE 1 TABLET BY MOUTH ONCE DAILY AT BEDTIME. 30 tablet 0   losartan (COZAAR) 25 MG tablet Take 1 tablet (25 mg total) by mouth daily. 90 tablet 1   lovastatin (MEVACOR) 40 MG tablet Take 1 tablet (40 mg total) by mouth daily. 90 tablet 1   nitroGLYCERIN (  NITROSTAT) 0.4 MG SL tablet Place 1 tablet (0.4 mg total) under the tongue every 5 (five) minutes as needed for chest pain. 25 tablet 5   oxymetazoline (AFRIN) 0.05 % nasal spray Place 1 spray into both nostrils 2 (two) times daily.     No current facility-administered medications for this visit.    Allergies  Beta adrenergic blockers, Lasix [furosemide], Cefzil [cefprozil], Dexamethasone, Gabapentin, Neomycin, Tetracyclines & related, Ciprofloxacin, Methocarbamol, and Penicillins  Electrocardiogram:  04/07/18  SR PAC;s otherwise normal   Assessment and Plan  CAD:  Distant PCI RCA 1999. Low risk myovue April 2018 Clinically stable Intolerant to beta blockers continue  medical Rx  AAA: 5.1 cm by [place] 02/11/21 similar to CTA cone 11/27/20 F/U with Dr Early [facility] to discuss stent graft   HTN:  Well controlled.  Continue current medications and low sodium Dash type diet.    GERD:  Continue carafate and nexium f/u GI  Asthma:  On inhalers no active wheezing   PAC/SVT:  Stable unable to take beta blockers  Anxiety/Depression:   Has not started on SSRI   ENT:  ARB held hearing worse using Aides no further epistaxis   HLD:  On mevacor 40 mg daily LDL 67 at goal   Hernia:  F/u Arnoldo Morale seems healed but still with pain/swelling    F/U [facility] January F/U with me in a year   Jenkins Rouge

## 2021-05-01 NOTE — Telephone Encounter (Signed)
Albuterol neb solution has been sent to preferred pharmacy.  Patient is aware and voiced his understanding.  Nothing further needed at this time.

## 2021-05-13 ENCOUNTER — Encounter: Payer: Self-pay | Admitting: Cardiovascular Disease

## 2021-05-13 ENCOUNTER — Ambulatory Visit (INDEPENDENT_AMBULATORY_CARE_PROVIDER_SITE_OTHER): Payer: Medicare Other | Admitting: Cardiovascular Disease

## 2021-05-13 ENCOUNTER — Other Ambulatory Visit: Payer: Self-pay

## 2021-05-13 VITALS — BP 100/68 | HR 76 | Ht 73.0 in | Wt 199.2 lb

## 2021-05-13 DIAGNOSIS — I2510 Atherosclerotic heart disease of native coronary artery without angina pectoris: Secondary | ICD-10-CM | POA: Diagnosis not present

## 2021-05-13 DIAGNOSIS — E782 Mixed hyperlipidemia: Secondary | ICD-10-CM | POA: Diagnosis not present

## 2021-05-13 DIAGNOSIS — I714 Abdominal aortic aneurysm, without rupture, unspecified: Secondary | ICD-10-CM

## 2021-05-13 DIAGNOSIS — I10 Essential (primary) hypertension: Secondary | ICD-10-CM | POA: Diagnosis not present

## 2021-05-13 NOTE — Patient Instructions (Signed)
Medication Instructions:  Your physician recommends that you continue on your current medications as directed. Please refer to the Current Medication list given to you today.  *If you need a refill on your cardiac medications before your next appointment, please call your pharmacy*   Lab Work: None If you have labs (blood work) drawn today and your tests are completely normal, you will receive your results only by: [place] (if you have MyChart) OR A paper copy in the mail If you have any lab test that is abnormal or we need to change your treatment, we will call you to review the results.   Testing/Procedures: None   Follow-Up: At [facility], you and your health needs are our priority.  As part of our continuing mission to provide you with exceptional heart care, we have created designated Provider Care Teams.  These Care Teams include your primary Cardiologist (physician) and Advanced Practice Providers (APPs -  Physician Assistants and Nurse Practitioners) who all work together to provide you with the care you need, when you need it.  We recommend signing up for the patient portal called "MyChart".  Sign up information is provided on this After Visit Summary.  MyChart is used to connect with patients for Virtual Visits (Telemedicine).  Patients are able to view lab/test results, encounter notes, upcoming appointments, etc.  Non-urgent messages can be sent to your provider as well.   To learn more about what you can do with MyChart, go to [email or web address].    Your next appointment:   6 month(s)  The format for your next appointment:   In Person  Provider:   Jenkins Rouge, MD    Other Instructions

## 2021-05-14 ENCOUNTER — Ambulatory Visit (HOSPITAL_COMMUNITY): Payer: Medicare Other

## 2021-05-14 ENCOUNTER — Encounter (HOSPITAL_COMMUNITY): Payer: Self-pay

## 2021-05-15 ENCOUNTER — Ambulatory Visit (HOSPITAL_COMMUNITY): Payer: Medicare Other | Admitting: Physical Therapy

## 2021-05-18 ENCOUNTER — Other Ambulatory Visit: Payer: Self-pay

## 2021-05-18 DIAGNOSIS — I714 Abdominal aortic aneurysm, without rupture, unspecified: Secondary | ICD-10-CM

## 2021-05-21 DIAGNOSIS — L570 Actinic keratosis: Secondary | ICD-10-CM | POA: Diagnosis not present

## 2021-05-21 DIAGNOSIS — X32XXXD Exposure to sunlight, subsequent encounter: Secondary | ICD-10-CM | POA: Diagnosis not present

## 2021-05-21 DIAGNOSIS — C4441 Basal cell carcinoma of skin of scalp and neck: Secondary | ICD-10-CM | POA: Diagnosis not present

## 2021-05-21 DIAGNOSIS — I714 Abdominal aortic aneurysm, without rupture, unspecified: Secondary | ICD-10-CM | POA: Diagnosis not present

## 2021-05-22 ENCOUNTER — Ambulatory Visit (HOSPITAL_COMMUNITY): Payer: Medicare Other

## 2021-05-22 LAB — BUN+CREAT
BUN/Creatinine Ratio: 14 (ref 10–24)
BUN: 12 mg/dL (ref 8–27)
Creatinine, Ser: 0.86 mg/dL (ref 0.76–1.27)
eGFR: 88 mL/min/{1.73_m2} (ref >59)

## 2021-05-25 ENCOUNTER — Ambulatory Visit (HOSPITAL_COMMUNITY)
Admission: RE | Admit: 2021-05-25 | Discharge: 2021-05-25 | Disposition: A | Discharge status: Home / Self Care | Payer: Medicare Other | Source: Ambulatory Visit | Attending: Neurosurgery | Admitting: Neurosurgery

## 2021-05-25 ENCOUNTER — Other Ambulatory Visit: Payer: Self-pay

## 2021-05-25 DIAGNOSIS — M5136 Other intervertebral disc degeneration, lumbar region: Secondary | ICD-10-CM | POA: Insufficient documentation

## 2021-05-25 DIAGNOSIS — M48061 Spinal stenosis, lumbar region without neurogenic claudication: Secondary | ICD-10-CM | POA: Diagnosis not present

## 2021-05-27 DIAGNOSIS — H43811 Vitreous degeneration, right eye: Secondary | ICD-10-CM | POA: Diagnosis not present

## 2021-05-27 DIAGNOSIS — H35373 Puckering of macula, bilateral: Secondary | ICD-10-CM | POA: Diagnosis not present

## 2021-05-27 DIAGNOSIS — H31092 Other chorioretinal scars, left eye: Secondary | ICD-10-CM | POA: Diagnosis not present

## 2021-05-27 DIAGNOSIS — M5136 Other intervertebral disc degeneration, lumbar region: Secondary | ICD-10-CM | POA: Diagnosis not present

## 2021-05-28 ENCOUNTER — Other Ambulatory Visit: Payer: Self-pay

## 2021-05-28 ENCOUNTER — Encounter (HOSPITAL_COMMUNITY): Payer: Self-pay | Admitting: Physical Therapy

## 2021-05-28 ENCOUNTER — Ambulatory Visit (HOSPITAL_COMMUNITY): Payer: Medicare Other | Attending: Family Medicine | Admitting: Physical Therapy

## 2021-05-28 DIAGNOSIS — M6281 Muscle weakness (generalized): Secondary | ICD-10-CM | POA: Diagnosis not present

## 2021-05-28 DIAGNOSIS — M25512 Pain in left shoulder: Secondary | ICD-10-CM | POA: Diagnosis not present

## 2021-05-28 DIAGNOSIS — M545 Low back pain, unspecified: Secondary | ICD-10-CM | POA: Insufficient documentation

## 2021-05-28 DIAGNOSIS — G8929 Other chronic pain: Secondary | ICD-10-CM | POA: Diagnosis not present

## 2021-05-28 NOTE — Patient Instructions (Signed)
Access Code: [email or web address] URL: [email or web address] Date: 05/28/2021 Prepared by: Josue Hector  Exercises Supine Bridge - 2-3 x daily - 7 x weekly - 2 sets - 10 reps - 5 second hold Supine Transversus Abdominis Bracing - Hands on Stomach - 2-3 x daily - 7 x weekly - 2 sets - 10 reps - 5 second hold

## 2021-05-28 NOTE — Therapy (Signed)
[address] [place], [place], [address] Phone: [phone]   Fax:  [phone]  Physical Therapy Evaluation  Patient Details  Name: [name] [name] MRN: [id number] Date of Birth: 06-09-1940 Referring Provider (PT): Consuella Lose MD   Encounter Date: 05/28/2021   PT End of Session - 05/28/21 1105     Visit Number 1    Number of Visits 12    Date for PT Re-Evaluation 07/09/21    Authorization Type Medicare A/ [employer] 2ndary    Progress Note Due on Visit 10    PT Start Time 1030    PT Stop Time 1115    PT Time Calculation (min) 45 min    Activity Tolerance Patient tolerated treatment well    Behavior During Therapy [facility] for tasks assessed/performed             Past Medical History:  Diagnosis Date   AAA (abdominal aortic aneurysm)    needs yearly ultrasound   Allergy    Anemia    Arthritis    Asthma    BCC (basal cell carcinoma) 08/18/1989   left shoulder blad, upper right arm, left arm beyond elbow, c&d   BCC (basal cell carcinoma) 01/31/1992   Posterior neck, curetx3, 69fu   BCC (basal cell carcinoma) 11/22/2001   mid forehead, cx3, excision, right forearm, cx3, 41fu   BCC (basal cell carcinoma) 10/09/2003   mid forehead, MOHs   BCC (basal cell carcinoma) 08/15/2008   upper left back, biopsy   BPH (benign prostatic hyperplasia)    CAD (coronary artery disease)    Cancer ([place])    skin cancer   COPD (chronic obstructive pulmonary disease) ([place])    Dysrhythmia    pt. states it can be fast at times   GERD (gastroesophageal reflux disease)    Glaucoma    HOH (hard of hearing)    Hypercholesterolemia    Hypertension    Impaired fasting glucose    Low back pain    Melanoma in situ ([place]) 10/09/2003   left chin, MOHs   MI (myocardial infarction) ([place]) 1999   SCC (squamous cell carcinoma) 07/03/2014   in situ, behind left ear, cx3, cautery, 50fu   SCC (squamous cell carcinoma) 07/03/2014   well diff, left  forearm, biopsy, cx1, cautery   SCC (squamous cell carcinoma) 07/20/2017   in situ, left upper arm, cx3, 4fu   SCC (squamous cell carcinoma) 01/10/2019   in situ, left post shoulder, cx3, 80fu   SCC (squamous cell carcinoma) 11/22/2001   left forearm distal, left forearm, cx3, 40fu   SCC (squamous cell carcinoma) 10/09/2003   Bowens, left ear post, clear per st, right cheek clear   SCC (squamous cell carcinoma) 03/30/2004   in situ, left upper arm, cx3, 51fu   SCC (squamous cell carcinoma) 03/08/2005   in situ, right cheek, mid upper forehead, cx3, 93fu   SCC (squamous cell carcinoma) 06/08/2006   in situ, left shoulder, cx3, 53fu   SCC (squamous cell carcinoma) 05/05/2010   right inner wrist, biopsy   SCC (squamous cell carcinoma) 09/13/2013   in situ, right crown scalp, front scalp, biopsy   Thrush     Past Surgical History:  Procedure Laterality Date   BACK SURGERY     x 3   CARDIAC CATHETERIZATION     angioplasty   CATARACT EXTRACTION W/PHACO  03/20/2012   Procedure: CATARACT EXTRACTION PHACO AND INTRAOCULAR LENS PLACEMENT ([place]);  Surgeon: Debe Coder  Iona Hansen, MD;  Location: [facility];  Service: Ophthalmology;  Laterality: Right;  CDE:  8.45   CATARACT EXTRACTION W/PHACO Left 04/02/2013   Procedure: CATARACT EXTRACTION PHACO AND INTRAOCULAR LENS PLACEMENT ([facility]);  Surgeon: Williams Che, MD;  Location: [facility];  Service: Ophthalmology;  Laterality: Left;  CDE:  6.50   CHOLECYSTECTOMY  2000   COLONOSCOPY  2009   repeat 5 years   ESOPHAGOGASTRODUODENOSCOPY     HERNIA REPAIR Left    inguinal   INGUINAL HERNIA REPAIR Right 03/28/2020   Procedure: Right Inguinal Herniorrhaphy with Mesh;  Surgeon: Aviva Signs, MD;  Location: [facility];  Service: General;  Laterality: Right;   LAPAROSCOPIC PARTIAL COLECTOMY N/A 06/11/2013   Procedure: LAPAROSCOPIC HAND ASSISTED PARTIAL COLECTOMY;  Surgeon: Jamesetta So, MD;  Location: [facility];  Service: General;  Laterality: N/A;   NASAL ENDOSCOPY WITH  EPISTAXIS CONTROL Bilateral 02/11/2020   Procedure: NASAL ENDOSCOPY WITH EPISTAXIS CONTROL;  Surgeon: Leta Baptist, MD;  Location: [facility];  Service: ENT;  Laterality: Bilateral;   right eye detached retina Bilateral    SPINAL FUSION  0923   YAG LASER APPLICATION Left 30/11/6224   Procedure: YAG LASER APPLICATION;  Surgeon: Williams Che, MD;  Location: [facility];  Service: Ophthalmology;  Laterality: Left;    There were no vitals filed for this visit.    Subjective Assessment - 05/28/21 1040     Subjective Patient presents to therapy with complaint of chronic LBP and BLE weakness. He reports history of 4 lumbar surgeries, including several laminectomies and lumbar fusion in 2016. HE notes progressive weakening in LEs and increased difficulty with his balance.    Pertinent History AAA, hernia repairs, 4 lumbar spine surgeries (laminectomy and fusions) 2016    Limitations Lifting;Standing;Walking;House hold activities    How long can you stand comfortably? up to an hour    Diagnostic tests MRI    Patient Stated Goals Get back to feeling comfortable with core muscle strength    Currently in Pain? No/denies                [facility] PT Assessment - 05/28/21 0001       Assessment   Medical Diagnosis LBP/ LE weakness    Referring Provider (PT) Consuella Lose MD    Onset Date/Surgical Date --   Chronic   Prior Therapy Yes      Precautions   Precautions Fall      Restrictions   Weight Bearing Restrictions No      Balance Screen   Has the patient fallen in the past 6 months Yes    How many times? 1    Has the patient had a decrease in activity level because of a fear of falling?  No    Is the patient reluctant to leave their home because of a fear of falling?  No      Home Ecologist residence      Prior Function   Level of Independence Independent      Cognition   Overall Cognitive Status Within Functional Limits for tasks  assessed      Observation/Other Assessments   Focus on Therapeutic Outcomes (FOTO)  61% function      ROM / Strength   AROM / PROM / Strength AROM;Strength      AROM   AROM Assessment Site Lumbar    Lumbar Flexion 50% limited    Lumbar Extension 50% limited  Lumbar - Right Side Bend 50% limited    Lumbar - Left Side Bend 50% limited      Strength   Strength Assessment Site Hip;Knee;Ankle    Right/Left Hip Right;Left    Right Hip Flexion 4+/5    Right Hip Extension 3-/5    Right Hip ABduction 4+/5    Left Hip Flexion 4+/5    Left Hip Extension 3-/5    Left Hip ABduction 3+/5    Right/Left Knee Right;Left    Right Knee Extension 5/5    Left Knee Extension 4+/5    Right/Left Ankle Right;Left    Right Ankle Dorsiflexion 4/5    Left Ankle Dorsiflexion 4-/5      Palpation   Palpation comment No noted TTP in lumbar      Transfers   Five time sit to stand comments  10.8 sec with no UEs      Balance   Balance Assessed Yes      Static Standing Balance   Static Standing Balance -  Activities  Tandam Stance - Right Leg;Tandam Stance - Left Leg    Static Standing - Comment/# of Minutes 12 sec, 30 sec                        Objective measurements completed on examination: See above findings.       [facility] Adult PT Treatment/Exercise - 05/28/21 0001       Exercises   Exercises Lumbar      Lumbar Exercises: Supine   Ab Set 5 reps;5 seconds    Bridge 10 reps                     PT Education - 05/28/21 1042     Education Details on evaluation findings, POC and HEP    Person(s) Educated Patient    Methods Explanation;Handout    Comprehension Verbalized understanding              PT Short Term Goals - 05/28/21 1111       PT SHORT TERM GOAL #1   Title Patient will be independent with initial HEP and self-management strategies to improve functional outcomes    Time 3    Period Weeks    Status New    Target Date 06/18/21                PT Long Term Goals - 05/28/21 1112       PT LONG TERM GOAL #1   Title Patient will be able to ambulate at least 350 feet during 2MWT with LRAD to demonstrate improved ability to perform functional mobility and associated tasks.    Time 6    Period Weeks    Status New    Target Date 07/09/21      PT LONG TERM GOAL #2   Title Patient will improve FOTO score by at least 5 points to indicate improvement in functional outcomes    Time 6    Period Weeks    Status New    Target Date 07/09/21      PT LONG TERM GOAL #3   Title Patient will have equal to or > 4/5 MMT throughout BLE (except LT ankle DF)  to improve ability to perform functional mobility, stair ambulation and ADLs.    Time 6    Period Weeks    Status New    Target Date 07/09/21      PT  LONG TERM GOAL #4   Title Patient will be able to maintain tandem stance >30 seconds on BLEs to improve stability and reduce risk for falls    Time 6    Period Weeks    Status New    Target Date 07/09/21                    Plan - 05/28/21 1106     Clinical Impression Statement Patient is a 81 y.o. male who presents to physical therapy with complaint of LBP and BLE weakness. Patient demonstrates decreased strength, ROM restriction and balance deficits which are likely contributing to symptoms of pain and are negatively impacting patient ability to perform ADLs and functional mobility tasks. Patient will benefit from skilled physical therapy services to address these deficits to reduce pain, improve level of function with ADLs, functional mobility tasks, and reduce risk for falls.    Personal Factors and Comorbidities Comorbidity 3+;Time since onset of injury/illness/exacerbation    Examination-Activity Limitations Lift;Stand;Locomotion Level;Transfers;Carry;Stairs    Examination-Participation Restrictions Laundry;Shop;Community Activity;Cleaning;Yard Work;Meal Prep    Stability/Clinical Decision Making  Stable/Uncomplicated    Clinical Decision Making Low    Rehab Potential Good    PT Frequency 2x / week    PT Duration 6 weeks    PT Treatment/Interventions ADLs/Self Care Home Management;Biofeedback;Traction;Moist Heat;Stair training;Gait training;Neuromuscular re-education;Balance training;DME Instruction;Iontophoresis 4mg /ml Dexamethasone;Scar mobilization;Visual/perceptual remediation/compensation;Passive range of motion;Dry needling;Manual techniques;Functional mobility training;Ultrasound;Parrafin;Fluidtherapy;Cryotherapy;Electrical Stimulation;Contrast Bath;Therapeutic exercise;Splinting;Taping;Compression bandaging;Energy conservation;Patient/family education;Therapeutic activities;Orthotic Fit/Training;Vasopneumatic Device;Joint Manipulations;Spinal Manipulations    PT Next Visit Plan Review HEP and progress core and LE strengtheign as tolerated. Table exercise> standing> gait and balance    PT Home Exercise Plan Eval: ab brace, bridge    Consulted and Agree with Plan of Care Patient             Patient will benefit from skilled therapeutic intervention in order to improve the following deficits and impairments:  Pain, Improper body mechanics, Decreased mobility, Decreased activity tolerance, Decreased range of motion, Decreased strength, Decreased endurance, Hypomobility, Decreased balance  Visit Diagnosis: Low back pain, unspecified back pain laterality, unspecified chronicity, unspecified whether sciatica present  Muscle weakness (generalized)     Problem List Patient Active Problem List   Diagnosis Date Noted   COPD (chronic obstructive pulmonary disease) ([place]) 04/23/2021   BPH (benign prostatic hyperplasia) 04/23/2021   CAD (coronary artery disease) 04/23/2021   Frequent nosebleeds 04/23/2021   HOH (hard of hearing)    HTN (hypertension) 09/16/2016   Lumbar spondylosis 11/05/2014   GERD (gastroesophageal reflux disease) 01/19/2012   Mixed hyperlipidemia 04/15/2008    AAA (abdominal aortic aneurysm) 04/15/2008   11:17 AM, 05/28/21 Josue Hector PT DPT  Physical Therapist with [facility]  [phone] [place], [place], [address] Phone: [phone]   Fax:  [phone]  Name: [name] [name] MRN: [id number] Date of Birth: 10/12/1940

## 2021-06-01 ENCOUNTER — Ambulatory Visit (INDEPENDENT_AMBULATORY_CARE_PROVIDER_SITE_OTHER): Payer: Medicare Other | Admitting: Pulmonary Disease

## 2021-06-01 ENCOUNTER — Other Ambulatory Visit: Payer: Self-pay

## 2021-06-01 ENCOUNTER — Encounter: Payer: Self-pay | Admitting: Pulmonary Disease

## 2021-06-01 VITALS — BP 106/64 | HR 72 | Temp 97.6°F | Ht 73.0 in | Wt 202.0 lb

## 2021-06-01 DIAGNOSIS — J441 Chronic obstructive pulmonary disease with (acute) exacerbation: Secondary | ICD-10-CM

## 2021-06-01 MED ORDER — AZITHROMYCIN 250 MG PO TABS: 250.0000 mg | ORAL_TABLET | Freq: Every day | ORAL | 0 refills | Status: DC

## 2021-06-01 MED ORDER — PREDNISONE 10 MG PO TABS: ORAL_TABLET | ORAL | 0 refills | Status: DC

## 2021-06-01 NOTE — Progress Notes (Signed)
History of Present Illness [name] [name] is a 81 y.o. male former  smoker ( quit 2004 with a 35 pack year smoking history) with COPD with chronic bronchitis and emphysema.  Maintenance medication  is Flovent Rescue is Albuterol  treated for mild exacerbation during last visit  -Appears to be having an exacerbation at present with increased cough and increased congestion -No significant increase in albuterol use   did receive pulmonary rehab last year  Last seen January 31 Pt. Presents for follow up after  flare of bronchitis with  yellow secretions. He was seen in the office 05/26/2020 and was treated with a z pack and Mucinex. I asked him to use his flutter valve 3-4 times daily to help with his chest congestion. As he was not wheezing we did not treat with prednisone. CXR done 1/10 showed COPD with chronic bronchitis and emphysema, but no acute issues.   He presents today stating he is about 75%  better. He does still have a cough but this is much better. Secretions are less in volume. They are thick and they remain yellow in color. He has stopped using his Mucinex and Flutter valve as he was feeling better. We have discussed continuing this until his secretions are thinner. He denies any fever, chest pain, orthopnea or hemoptysis.    Test Results: CXR 05/26/2020 Emphysematous changes. No pneumothorax or pleural effusion. No focal consolidation. Left hemidiaphragm eventration, unchanged. Stable cardiomediastinal silhouette. No acute osseous abnormality. No focal consolidation.  Emphysema.  CBC Latest Ref Rng & Units 09/05/2020 03/18/2020 12/28/2019  WBC 3.4 - 10.8 x10E3/uL 6.7 10.3 6.9  Hemoglobin 13.0 - 17.7 g/dL 13.4 13.4 13.5  Hematocrit 37.5 - 51.0 % 41.3 42.9 42.7  Platelets 150 - 450 x10E3/uL 214 214 231    BMP Latest Ref Rng & Units 05/21/2021 04/23/2021 11/05/2020  Glucose 70 - 99 mg/dL - 101(H) 107(H)  BUN 8 - 27 mg/dL 12 12 13   Creatinine 0.76 - 1.27 mg/dL 0.86 0.92 0.82   BUN/Creat Ratio 10 - 24 14 13  -  Sodium 134 - 144 mmol/L - 142 135  Potassium 3.5 - 5.2 mmol/L - 4.7 4.4  Chloride 96 - 106 mmol/L - 102 101  CO2 20 - 29 mmol/L - 26 28  Calcium 8.6 - 10.2 mg/dL - 9.2 8.9    BNP    Component Value Date/Time   BNP 44.0 09/16/2016 0235    ProBNP No results found for: PROBNP  PFT    Component Value Date/Time   FEV1PRE 1.58 11/05/2019 1342   FEV1POST 1.72 11/05/2019 1342   FVCPRE 2.80 11/05/2019 1342   FVCPOST 2.82 11/05/2019 1342   DLCOUNC 32.71 11/05/2019 1342   PREFEV1FVCRT 56 11/05/2019 1342   PSTFEV1FVCRT 61 11/05/2019 1342    MR LUMBAR SPINE WO CONTRAST  Result Date: 05/25/2021 CLINICAL DATA:  Chronic low back pain, radiating to left leg EXAM: MRI LUMBAR SPINE WITHOUT CONTRAST TECHNIQUE: Multiplanar, multisequence MR imaging of the lumbar spine was performed. No intravenous contrast was administered. COMPARISON:  12/05/2014 FINDINGS: Segmentation:  Standard. Alignment: Levocurvature of the lumbar spine. Trace retrolisthesis L2 on L3. 8 mm retrolisthesis L3 on L4, unchanged. 4 mm retrolisthesis L4 on L5, unchanged. Vertebrae: No acute fracture or suspicious osseous lesions. Redemonstrated L5-S1 posterior fusion, with evidence of osseous fusion across the disc space. Prior left hemilaminectomy at L4-L5. Prior decompression at L5-S1. Conus medullaris and cauda equina: Conus extends to the T12-L1. Level. Conus and cauda equina appear normal. Paraspinal and  other soft tissues: Atrophy of the inferior spinous musculature. Disc levels: T12-L1: Seen only on the sagittal images. No significant disc bulge, spinal canal stenosis, or neural foraminal narrowing. L1-L2: Disc bulge with superimposed left paracentral disc protrusion with central annular fissure. Mild facet arthropathy. No spinal canal stenosis or neural foraminal narrowing. L2-L3: Trace retrolisthesis with disc osteophyte complex. Moderate facet arthropathy. Narrowing of the lateral recesses. Mild  spinal canal stenosis, which has progressed slightly from the prior exam. No neural foraminal narrowing. L3-L4: Retrolisthesis. Mild disc bulge. Left hemilaminectomy. Moderate to severe facet arthropathy. No spinal canal stenosis. Narrowing of the right-greater-than-left lateral recess. Moderate to severe bilateral neural foraminal narrowing, overall unchanged from prior exam. L4-L5: Retrolisthesis with moderate disc bulge. Severe facet arthropathy. Left hemilaminectomy. No spinal canal stenosis. Narrowing of the lateral recesses. Moderate bilateral neural foraminal narrowing, overall unchanged. L5-S1: Status post fusion and decompression. No spinal canal stenosis. No definite neural foraminal narrowing. IMPRESSION: 1. L2-L3 mild spinal canal stenosis. Narrowing of the lateral recesses at this level could affect the descending L3 nerves. 2. L3-L4 moderate to severe bilateral neural foraminal narrowing. Narrowing of the right-greater-than-left lateral recess at this level could affect the descending L4 nerves. 3. L4-L5 moderate bilateral neural foraminal narrowing. Narrowing of the lateral recesses at this level may affect the descending L5 nerves. Electronically Signed   By: Merilyn Baba M.D.   On: 05/25/2021 17:49     Past medical hx Past Medical History:  Diagnosis Date   AAA (abdominal aortic aneurysm)    needs yearly ultrasound   Allergy    Anemia    Arthritis    Asthma    BCC (basal cell carcinoma) 08/18/1989   left shoulder blad, upper right arm, left arm beyond elbow, c&d   BCC (basal cell carcinoma) 01/31/1992   Posterior neck, curetx3, 80fu   BCC (basal cell carcinoma) 11/22/2001   mid forehead, cx3, excision, right forearm, cx3, 88fu   BCC (basal cell carcinoma) 10/09/2003   mid forehead, MOHs   BCC (basal cell carcinoma) 08/15/2008   upper left back, biopsy   BPH (benign prostatic hyperplasia)    CAD (coronary artery disease)    Cancer ([facility])    skin cancer   COPD (chronic  obstructive pulmonary disease) ([place])    Dysrhythmia    pt. states it can be fast at times   GERD (gastroesophageal reflux disease)    Glaucoma    HOH (hard of hearing)    Hypercholesterolemia    Hypertension    Impaired fasting glucose    Low back pain    Melanoma in situ ([place]) 10/09/2003   left chin, MOHs   MI (myocardial infarction) ([place]) 1999   SCC (squamous cell carcinoma) 07/03/2014   in situ, behind left ear, cx3, cautery, 45fu   SCC (squamous cell carcinoma) 07/03/2014   well diff, left forearm, biopsy, cx1, cautery   SCC (squamous cell carcinoma) 07/20/2017   in situ, left upper arm, cx3, 64fu   SCC (squamous cell carcinoma) 01/10/2019   in situ, left post shoulder, cx3, 77fu   SCC (squamous cell carcinoma) 11/22/2001   left forearm distal, left forearm, cx3, 72fu   SCC (squamous cell carcinoma) 10/09/2003   Bow[place], left ear post, clear per st, right cheek clear   SCC (squamous cell carcinoma) 03/30/2004   in situ, left upper arm, cx3, 55fu   SCC (squamous cell carcinoma) 03/08/2005   in situ, right cheek, mid upper forehead, cx3, 19fu   SCC (squamous cell  carcinoma) 06/08/2006   in situ, left shoulder, cx3, 82fu   SCC (squamous cell carcinoma) 05/05/2010   right inner wrist, biopsy   SCC (squamous cell carcinoma) 09/13/2013   in situ, right crown scalp, front scalp, biopsy   Thrush      Social History   Tobacco Use   Smoking status: Former    Packs/day: 1.00    Years: 35.00    Pack years: 35.00    Types: Cigarettes    Quit date: 03/28/2003    Years since quitting: 18.1   Smokeless tobacco: Never  Vaping Use   Vaping Use: Never used  Substance Use Topics   Alcohol use: No    Alcohol/week: 0.0 standard drinks   Drug use: No    [name] [name] reports that he quit smoking about 18 years ago. His smoking use included cigarettes. He has a 35.00 pack-year smoking history. He has never used smokeless tobacco. He reports that he does not drink alcohol and does not use  drugs.  Tobacco Cessation: Former smoker , Quit 2004 with a 35 pack year smoking history  Past surgical hx, Family hx, Social hx all reviewed.  Current Outpatient Medications on File Prior to Visit  Medication Sig   albuterol (PROVENTIL) (2.5 MG/3ML) 0.083% nebulizer solution Take 3 mLs (2.5 mg total) by nebulization every 6 (six) hours as needed for wheezing or shortness of breath.   albuterol (VENTOLIN HFA) 108 (90 Base) MCG/ACT inhaler 2 puffs as needed   alfuzosin (UROXATRAL) 10 MG 24 hr tablet Take 1 tablet (10 mg total) by mouth daily with breakfast.   cetirizine (ZYRTEC) 10 MG tablet Take 10 mg by mouth daily.   COMBIGAN 0.2-0.5 % ophthalmic solution Apply 1 drop to eye 2 (two) times daily.   esomeprazole (NEXIUM) 40 MG capsule Take 40 mg by mouth daily.   fluticasone (FLOVENT HFA) 220 MCG/ACT inhaler Inhale 2 puffs into the lungs in the morning and at bedtime.   hydroxypropyl methylcellulose / hypromellose (ISOPTO TEARS / GONIOVISC) 2.5 % ophthalmic solution Place 1 drop into both eyes 3 (three) times daily as needed for dry eyes.   LORazepam (ATIVAN) 0.5 MG tablet TAKE 1 TABLET BY MOUTH ONCE DAILY AT BEDTIME.   losartan (COZAAR) 25 MG tablet Take 1 tablet (25 mg total) by mouth daily.   lovastatin (MEVACOR) 40 MG tablet Take 1 tablet (40 mg total) by mouth daily.   nitroGLYCERIN (NITROSTAT) 0.4 MG SL tablet Place 1 tablet (0.4 mg total) under the tongue every 5 (five) minutes as needed for chest pain.   oxymetazoline (AFRIN) 0.05 % nasal spray Place 1 spray into both nostrils 2 (two) times daily.   No current facility-administered medications on file prior to visit.     Allergies  Allergen Reactions   Beta Adrenergic Blockers Diarrhea and Other (See Comments)    Does not recall an allergy   Lasix [Furosemide] Rash   Cefzil [Cefprozil] Nausea Only    Patient does recall allergy   Ciprofloxacin Nausea And Vomiting, Rash and Other (See Comments)    Body aches     Dexamethasone Swelling   Gabapentin Swelling   Methocarbamol Swelling and Rash   Neomycin Other (See Comments)   Penicillins Swelling and Rash    Has patient had a PCN reaction causing immediate rash, facial/tongue/throat swelling, SOB or lightheadedness with hypotension: yes Has patient had a PCN reaction causing severe rash involving mucus membranes or skin necrosis: unknown Has patient had a PCN reaction that required hospitalization:  no Has patient had a PCN reaction occurring within the last 10 years: No If all of the above answers are "NO", then may proceed with Cephalosporin use.    Tetracyclines & Related Itching    Review Of Systems:  Constitutional:   No  weight loss, night sweats,  Fevers, chills, fatigue, or  lassitude.  HEENT:   No headaches,  Difficulty swallowing,  Tooth/dental problems, or  Sore throat,                No sneezing, itching, ear ache, nasal congestion, post nasal drip,   CV:  No chest pain,  Orthopnea, PND, swelling in lower extremities, anasarca, dizziness, palpitations, syncope.   GI  No heartburn, indigestion, abdominal pain, nausea, vomiting, diarrhea, change in bowel habits, loss of appetite, bloody stools.   Resp: No shortness of breath with exertion or at rest.  Slight increase over baseline  mucus, + productive cough,  No non-productive cough,  No coughing up of blood.  No change in color of mucus.  No wheezing.  No chest wall deformity, secretions are thick  Skin: no rash or lesions.  GU: no dysuria, change in color of urine, no urgency or frequency.  No flank pain, no hematuria   MS:  No joint pain or swelling.  No decreased range of motion.  No back pain.  Psych:  No change in mood or affect. No depression or anxiety.  No memory loss.   Vital Signs BP 106/64 (BP Location: Left Arm, Cuff Size: Normal)    Pulse 72    SpO2 96%    Physical Exam:  Appearance - well kempt  ENMT -moist oral mucosa  neck -supple, no JVD or  thyromegaly Respiratory -clear breath sounds CV - s1s2 Lymph -no adenopathy MSK -normal tone   Assessment/Plan COPD -Appears to have a mild exacerbation -We will continue bronchodilators, continue Flovent   Prescription for azithromycin and 7-day course of steroids  Follow-up in 6 months  [name] [name] Clearence Ped, MD 06/01/2021  11:47 AM

## 2021-06-01 NOTE — Patient Instructions (Signed)
I will see you in about 6 months  Prescription for prednisone and Z-Pak sent to pharmacy  Continue Flovent  Call us with significant concerns

## 2021-06-02 DIAGNOSIS — H401111 Primary open-angle glaucoma, right eye, mild stage: Secondary | ICD-10-CM | POA: Diagnosis not present

## 2021-06-02 DIAGNOSIS — H401122 Primary open-angle glaucoma, left eye, moderate stage: Secondary | ICD-10-CM | POA: Diagnosis not present

## 2021-06-03 ENCOUNTER — Other Ambulatory Visit: Payer: Self-pay

## 2021-06-03 ENCOUNTER — Ambulatory Visit (HOSPITAL_COMMUNITY): Payer: Medicare Other | Admitting: Physical Therapy

## 2021-06-03 ENCOUNTER — Encounter (HOSPITAL_COMMUNITY): Payer: Self-pay | Admitting: Physical Therapy

## 2021-06-03 DIAGNOSIS — M6281 Muscle weakness (generalized): Secondary | ICD-10-CM

## 2021-06-03 DIAGNOSIS — M25512 Pain in left shoulder: Secondary | ICD-10-CM | POA: Diagnosis not present

## 2021-06-03 DIAGNOSIS — M545 Low back pain, unspecified: Secondary | ICD-10-CM

## 2021-06-03 DIAGNOSIS — G8929 Other chronic pain: Secondary | ICD-10-CM | POA: Diagnosis not present

## 2021-06-03 NOTE — Therapy (Signed)
[place], [place], [address] Phone: [phone]   Fax:  [phone]  Physical Therapy Treatment  Patient Details  Name: [name] [name] MRN: [id number] Date of Birth: [birth year] Referring Provider (PT): Consuella Lose MD   Encounter Date: 06/03/2021   PT End of Session - 06/03/21 1035     Visit Number 2    Number of Visits 12    Date for PT Re-Evaluation 07/09/21    Authorization Type Medicare A/ [employer] 2ndary    Progress Note Due on Visit 10    PT Start Time 1032    PT Stop Time 1118    PT Time Calculation (min) 46 min    Activity Tolerance Patient tolerated treatment well    Behavior During Therapy [facility] for tasks assessed/performed             Past Medical History:  Diagnosis Date   AAA (abdominal aortic aneurysm)    needs yearly ultrasound   Allergy    Anemia    Arthritis    Asthma    BCC (basal cell carcinoma) 08/18/1989   left shoulder blad, upper right arm, left arm beyond elbow, c&d   BCC (basal cell carcinoma) 01/31/1992   Posterior neck, curetx3, 29fu   BCC (basal cell carcinoma) 11/22/2001   mid forehead, cx3, excision, right forearm, cx3, 20fu   BCC (basal cell carcinoma) 10/09/2003   mid forehead, MOHs   BCC (basal cell carcinoma) 08/15/2008   upper left back, biopsy   BPH (benign prostatic hyperplasia)    CAD (coronary artery disease)    Cancer ([place])    skin cancer   COPD (chronic obstructive pulmonary disease) ([place])    Dysrhythmia    pt. states it can be fast at times   GERD (gastroesophageal reflux disease)    Glaucoma    HOH (hard of hearing)    Hypercholesterolemia    Hypertension    Impaired fasting glucose    Low back pain    Melanoma in situ ([place]) 10/09/2003   left chin, MOHs   MI (myocardial infarction) ([place]) 1999   SCC (squamous cell carcinoma) 07/03/2014   in situ, behind left ear, cx3, cautery, 11fu   SCC (squamous cell carcinoma) 07/03/2014   well diff, left  forearm, biopsy, cx1, cautery   SCC (squamous cell carcinoma) 07/20/2017   in situ, left upper arm, cx3, 51fu   SCC (squamous cell carcinoma) 01/10/2019   in situ, left post shoulder, cx3, 68fu   SCC (squamous cell carcinoma) 11/22/2001   left forearm distal, left forearm, cx3, 72fu   SCC (squamous cell carcinoma) 10/09/2003   [place], left ear post, clear per st, right cheek clear   SCC (squamous cell carcinoma) 03/30/2004   in situ, left upper arm, cx3, 43fu   SCC (squamous cell carcinoma) 03/08/2005   in situ, right cheek, mid upper forehead, cx3, 34fu   SCC (squamous cell carcinoma) 06/08/2006   in situ, left shoulder, cx3, 58fu   SCC (squamous cell carcinoma) 05/05/2010   right inner wrist, biopsy   SCC (squamous cell carcinoma) 09/13/2013   in situ, right crown scalp, front scalp, biopsy   Thrush     Past Surgical History:  Procedure Laterality Date   BACK SURGERY     x 3   CARDIAC CATHETERIZATION     angioplasty   CATARACT EXTRACTION W/PHACO  03/20/2012   Procedure: CATARACT EXTRACTION PHACO AND INTRAOCULAR LENS PLACEMENT ([place]);  Surgeon: Debe Coder  Iona Hansen, MD;  [facility]: [facility];  Service: Ophthalmology;  Laterality: Right;  CDE:  8.45   CATARACT EXTRACTION W/PHACO Left 04/02/2013   Procedure: CATARACT EXTRACTION PHACO AND INTRAOCULAR LENS PLACEMENT ([facility]);  Surgeon: Williams Che, MD;  Location: [facility];  Service: Ophthalmology;  Laterality: Left;  CDE:  6.50   CHOLECYSTECTOMY  2000   COLONOSCOPY  2009   repeat 5 years   ESOPHAGOGASTRODUODENOSCOPY     HERNIA REPAIR Left    inguinal   INGUINAL HERNIA REPAIR Right 03/28/2020   Procedure: Right Inguinal Herniorrhaphy with Mesh;  Surgeon: Aviva Signs, MD;  Location: [facility];  Service: General;  Laterality: Right;   LAPAROSCOPIC PARTIAL COLECTOMY N/A 06/11/2013   Procedure: LAPAROSCOPIC HAND ASSISTED PARTIAL COLECTOMY;  Surgeon: Jamesetta So, MD;  Location: [facility];  Service: General;  Laterality: N/A;   NASAL ENDOSCOPY WITH  EPISTAXIS CONTROL Bilateral 02/11/2020   Procedure: NASAL ENDOSCOPY WITH EPISTAXIS CONTROL;  Surgeon: Leta Baptist, MD;  Location: [place];  Service: ENT;  Laterality: Bilateral;   right eye detached retina Bilateral    SPINAL FUSION  3818   YAG LASER APPLICATION Left 29/93/7169   Procedure: YAG LASER APPLICATION;  Surgeon: Williams Che, MD;  Location: [facility];  Service: Ophthalmology;  Laterality: Left;    There were no vitals filed for this visit.   Subjective Assessment - 06/03/21 1034     Subjective Doing well today. No pain. Has been compliant with HEP. Noting some burning in groin area initially but has since improved.    Pertinent History AAA, hernia repairs, 4 lumbar spine surgeries (laminectomy and fusions) 2016    Limitations Lifting;Standing;Walking;House hold activities    How long can you stand comfortably? up to an hour    Diagnostic tests MRI    Patient Stated Goals Get back to feeling comfortable with core muscle strength    Currently in Pain? No/denies                               [facility] Adult PT Treatment/Exercise - 06/03/21 0001       Lumbar Exercises: Standing   Heel Raises 20 reps    Heel Raises Limitations TR x 10 (very difficult) modifed to seated TR x 20    Other Standing Lumbar Exercises tandem stance 2 x 30"      Lumbar Exercises: Seated   Sit to Stand 20 reps      Lumbar Exercises: Supine   Ab Set 15 reps;5 seconds    Bent Knee Raise 20 reps    Bridge 15 reps      Lumbar Exercises: Sidelying   Hip Abduction Both;20 reps   2 sets of 10                      PT Short Term Goals - 05/28/21 1111       PT SHORT TERM GOAL #1   Title Patient will be independent with initial HEP and self-management strategies to improve functional outcomes    Time 3    Period Weeks    Status New    Target Date 06/18/21               PT Long Term Goals - 05/28/21 1112       PT LONG TERM GOAL #1   Title  Patient will be able to ambulate at least 350 feet during 2MWT  with LRAD to demonstrate improved ability to perform functional mobility and associated tasks.    Time 6    Period Weeks    Status New    Target Date 07/09/21      PT LONG TERM GOAL #2   Title Patient will improve FOTO score by at least 5 points to indicate improvement in functional outcomes    Time 6    Period Weeks    Status New    Target Date 07/09/21      PT LONG TERM GOAL #3   Title Patient will have equal to or > 4/5 MMT throughout BLE (except LT ankle DF)  to improve ability to perform functional mobility, stair ambulation and ADLs.    Time 6    Period Weeks    Status New    Target Date 07/09/21      PT LONG TERM GOAL #4   Title Patient will be able to maintain tandem stance >30 seconds on BLEs to improve stability and reduce risk for falls    Time 6    Period Weeks    Status New    Target Date 07/09/21                   Plan - 06/03/21 1118     Clinical Impression Statement Patient tolerated session well today. Reviewed therapy goals and HEP. Initiated ther ex. Progressed hip and core strengthening. Patient challenged with standing toe raises due to ankle weakness. Modified to seated position with better return. Patient cued on stance with tandem stance, and leg position with side lying hip abduction. Issued updated HEP handout. Patient will continue to benefit from skilled therapy services to reduce deficits and improve functional ability.    Personal Factors and Comorbidities Comorbidity 3+;Time since onset of injury/illness/exacerbation    Examination-Activity Limitations Lift;Stand;Locomotion Level;Transfers;Carry;Stairs    Examination-Participation Restrictions Laundry;Shop;Community Activity;Cleaning;Yard Work;Meal Prep    Stability/Clinical Decision Making Stable/Uncomplicated    Rehab Potential Good    PT Frequency 2x / week    PT Duration 6 weeks    PT Treatment/Interventions ADLs/Self Care  Home Management;Biofeedback;Traction;Moist Heat;Stair training;Gait training;Neuromuscular re-education;Balance training;DME Instruction;Iontophoresis 4mg /ml Dexamethasone;Scar mobilization;Visual/perceptual remediation/compensation;Passive range of motion;Dry needling;Manual techniques;Functional mobility training;Ultrasound;Parrafin;Fluidtherapy;Cryotherapy;Electrical Stimulation;Contrast Bath;Therapeutic exercise;Splinting;Taping;Compression bandaging;Energy conservation;Patient/family education;Therapeutic activities;Orthotic Fit/Training;Vasopneumatic Device;Joint Manipulations;Spinal Manipulations    PT Next Visit Plan Progress core and LE strengtheign as tolerated.  Standing> gait and balance    PT Home Exercise Plan Eval: ab brace, bridge 1/18 ab brace, sidelying hip abduction, seated TR    Consulted and Agree with Plan of Care Patient             Patient will benefit from skilled therapeutic intervention in order to improve the following deficits and impairments:  Pain, Improper body mechanics, Decreased mobility, Decreased activity tolerance, Decreased range of motion, Decreased strength, Decreased endurance, Hypomobility, Decreased balance  Visit Diagnosis: Low back pain, unspecified back pain laterality, unspecified chronicity, unspecified whether sciatica present  Muscle weakness (generalized)     Problem List Patient Active Problem List   Diagnosis Date Noted   COPD (chronic obstructive pulmonary disease) ([place]) 04/23/2021   BPH (benign prostatic hyperplasia) 04/23/2021   CAD (coronary artery disease) 04/23/2021   Frequent nosebleeds 04/23/2021   HOH (hard of hearing)    HTN (hypertension) 09/16/2016   Lumbar spondylosis 11/05/2014   GERD (gastroesophageal reflux disease) 01/19/2012   Mixed hyperlipidemia 04/15/2008   AAA (abdominal aortic aneurysm) 04/15/2008   11:24 AM, 06/03/21 Josue Hector PT DPT  Physical Therapist with [facility]   [phone] [place] [address] [place], [place], [address] Phone: [phone]   Fax:  [phone]  Name: [name] [name] MRN: [id number] Date of Birth: 1940/06/17

## 2021-06-03 NOTE — Patient Instructions (Signed)
Access Code: [email or web address] URL: [email or web address] Date: 06/03/2021 Prepared by: Josue Hector  Exercises Supine March - 2-3 x daily - 7 x weekly - 2 sets - 10 reps Sidelying Hip Abduction - 2-3 x daily - 7 x weekly - 2 sets - 10 reps Seated Heel Toe Raises - 2-3 x daily - 7 x weekly - 2 sets - 10 reps

## 2021-06-09 ENCOUNTER — Other Ambulatory Visit: Payer: Self-pay

## 2021-06-09 ENCOUNTER — Ambulatory Visit (HOSPITAL_COMMUNITY): Payer: Medicare Other | Admitting: Physical Therapy

## 2021-06-09 DIAGNOSIS — M545 Low back pain, unspecified: Secondary | ICD-10-CM | POA: Diagnosis not present

## 2021-06-09 DIAGNOSIS — G8929 Other chronic pain: Secondary | ICD-10-CM

## 2021-06-09 DIAGNOSIS — M6281 Muscle weakness (generalized): Secondary | ICD-10-CM | POA: Diagnosis not present

## 2021-06-09 DIAGNOSIS — M25512 Pain in left shoulder: Secondary | ICD-10-CM | POA: Diagnosis not present

## 2021-06-09 NOTE — Therapy (Signed)
[place], [place], [address] Phone: [phone]   Fax:  [phone]  Physical Therapy Treatment  Patient Details  Name: [name] [name] MRN: [id number] Date of Birth: 06-13-1940 Referring Provider (PT): Consuella Lose MD   Encounter Date: 06/09/2021   PT End of Session - 06/09/21 1256     Visit Number 3    Number of Visits 12    Date for PT Re-Evaluation 07/09/21    Authorization Type Medicare A/ [employer] 2ndary    Progress Note Due on Visit 10    PT Start Time 1050    PT Stop Time 1129    PT Time Calculation (min) 39 min    Activity Tolerance Patient tolerated treatment well    Behavior During Therapy [facility] for tasks assessed/performed             Past Medical History:  Diagnosis Date   AAA (abdominal aortic aneurysm)    needs yearly ultrasound   Allergy    Anemia    Arthritis    Asthma    BCC (basal cell carcinoma) 08/18/1989   left shoulder blad, upper right arm, left arm beyond elbow, c&d   BCC (basal cell carcinoma) 01/31/1992   Posterior neck, curetx3, 35fu   BCC (basal cell carcinoma) 11/22/2001   mid forehead, cx3, excision, right forearm, cx3, 37fu   BCC (basal cell carcinoma) 10/09/2003   mid forehead, MOHs   BCC (basal cell carcinoma) 08/15/2008   upper left back, biopsy   BPH (benign prostatic hyperplasia)    CAD (coronary artery disease)    Cancer ([place])    skin cancer   COPD (chronic obstructive pulmonary disease) ([place])    Dysrhythmia    pt. states it can be fast at times   GERD (gastroesophageal reflux disease)    Glaucoma    HOH (hard of hearing)    Hypercholesterolemia    Hypertension    Impaired fasting glucose    Low back pain    Melanoma in situ ([place]) 10/09/2003   left chin, MOHs   MI (myocardial infarction) ([place]) 1999   SCC (squamous cell carcinoma) 07/03/2014   in situ, behind left ear, cx3, cautery, 54fu   SCC (squamous cell carcinoma) 07/03/2014   well diff, left  forearm, biopsy, cx1, cautery   SCC (squamous cell carcinoma) 07/20/2017   in situ, left upper arm, cx3, 39fu   SCC (squamous cell carcinoma) 01/10/2019   in situ, left post shoulder, cx3, 88fu   SCC (squamous cell carcinoma) 11/22/2001   left forearm distal, left forearm, cx3, 36fu   SCC (squamous cell carcinoma) 10/09/2003   [place], left ear post, clear per st, right cheek clear   SCC (squamous cell carcinoma) 03/30/2004   in situ, left upper arm, cx3, 82fu   SCC (squamous cell carcinoma) 03/08/2005   in situ, right cheek, mid upper forehead, cx3, 58fu   SCC (squamous cell carcinoma) 06/08/2006   in situ, left shoulder, cx3, 20fu   SCC (squamous cell carcinoma) 05/05/2010   right inner wrist, biopsy   SCC (squamous cell carcinoma) 09/13/2013   in situ, right crown scalp, front scalp, biopsy   Thrush     Past Surgical History:  Procedure Laterality Date   BACK SURGERY     x 3   CARDIAC CATHETERIZATION     angioplasty   CATARACT EXTRACTION W/PHACO  03/20/2012   Procedure: CATARACT EXTRACTION PHACO AND INTRAOCULAR LENS PLACEMENT ([place]);  Surgeon: Debe Coder  Iona Hansen, MD;  Location: [facility];  Service: Ophthalmology;  Laterality: Right;  CDE:  8.45   CATARACT EXTRACTION W/PHACO Left 04/02/2013   Procedure: CATARACT EXTRACTION PHACO AND INTRAOCULAR LENS PLACEMENT ([facility]);  Surgeon: Williams Che, MD;  Location: [facility];  Service: Ophthalmology;  Laterality: Left;  CDE:  6.50   CHOLECYSTECTOMY  2000   COLONOSCOPY  2009   repeat 5 years   ESOPHAGOGASTRODUODENOSCOPY     HERNIA REPAIR Left    inguinal   INGUINAL HERNIA REPAIR Right 03/28/2020   Procedure: Right Inguinal Herniorrhaphy with Mesh;  Surgeon: Aviva Signs, MD;  Location: [facility];  Service: General;  Laterality: Right;   LAPAROSCOPIC PARTIAL COLECTOMY N/A 06/11/2013   Procedure: LAPAROSCOPIC HAND ASSISTED PARTIAL COLECTOMY;  Surgeon: Jamesetta So, MD;  Location: [facility];  Service: General;  Laterality: N/A;   NASAL ENDOSCOPY WITH  EPISTAXIS CONTROL Bilateral 02/11/2020   Procedure: NASAL ENDOSCOPY WITH EPISTAXIS CONTROL;  Surgeon: Leta Baptist, MD;  Location: [place];  Service: ENT;  Laterality: Bilateral;   right eye detached retina Bilateral    SPINAL FUSION  7425   YAG LASER APPLICATION Left 95/63/8756   Procedure: YAG LASER APPLICATION;  Surgeon: Williams Che, MD;  Location: [facility];  Service: Ophthalmology;  Laterality: Left;    There were no vitals filed for this visit.   Subjective Assessment - 06/09/21 1052     Subjective pt states his knees are sore from doing too much at home.  No other issues or complaints    Currently in Pain? No/denies                               [facility] Adult PT Treatment/Exercise - 06/09/21 0001       Lumbar Exercises: Standing   Heel Raises 20 reps    Other Standing Lumbar Exercises tandem stance 2 x 30"    Other Standing Lumbar Exercises rockerboard A/P and Rt/Lt 2 minutes      Lumbar Exercises: Seated   Sit to Stand 20 reps      Lumbar Exercises: Supine   Ab Set 15 reps;5 seconds    Bent Knee Raise 20 reps    Bridge 15 reps    Straight Leg Raise 10 reps                       PT Short Term Goals - 05/28/21 1111       PT SHORT TERM GOAL #1   Title Patient will be independent with initial HEP and self-management strategies to improve functional outcomes    Time 3    Period Weeks    Status New    Target Date 06/18/21               PT Long Term Goals - 05/28/21 1112       PT LONG TERM GOAL #1   Title Patient will be able to ambulate at least 350 feet during 2MWT with LRAD to demonstrate improved ability to perform functional mobility and associated tasks.    Time 6    Period Weeks    Status New    Target Date 07/09/21      PT LONG TERM GOAL #2   Title Patient will improve FOTO score by at least 5 points to indicate improvement in functional outcomes    Time 6    Period Weeks  Status New    Target  Date 07/09/21      PT LONG TERM GOAL #3   Title Patient will have equal to or > 4/5 MMT throughout BLE (except LT ankle DF)  to improve ability to perform functional mobility, stair ambulation and ADLs.    Time 6    Period Weeks    Status New    Target Date 07/09/21      PT LONG TERM GOAL #4   Title Patient will be able to maintain tandem stance >30 seconds on BLEs to improve stability and reduce risk for falls    Time 6    Period Weeks    Status New    Target Date 07/09/21                   Plan - 06/09/21 1254     Clinical Impression Statement Continued with focus on improving standing stability and LE strength.  Began with activites in parallel bars with tandem stance being most challenging to maintain.  Pt required tactile cues from therapist to stabilize when completing rocking board, especially with lateral movements.  No fatigue or pain voiced during session. Pt will continue to benefit from exercise progression to increase strength and stabilization.    Personal Factors and Comorbidities Comorbidity 3+;Time since onset of injury/illness/exacerbation    Examination-Activity Limitations Lift;Stand;Locomotion Level;Transfers;Carry;Stairs    Examination-Participation Restrictions Laundry;Shop;Community Activity;Cleaning;Yard Work;Meal Prep    Stability/Clinical Decision Making Stable/Uncomplicated    Rehab Potential Good    PT Frequency 2x / week    PT Duration 6 weeks    PT Treatment/Interventions ADLs/Self Care Home Management;Biofeedback;Traction;Moist Heat;Stair training;Gait training;Neuromuscular re-education;Balance training;DME Instruction;Iontophoresis 4mg /ml Dexamethasone;Scar mobilization;Visual/perceptual remediation/compensation;Passive range of motion;Dry needling;Manual techniques;Functional mobility training;Ultrasound;Parrafin;Fluidtherapy;Cryotherapy;Electrical Stimulation;Contrast Bath;Therapeutic exercise;Splinting;Taping;Compression bandaging;Energy  conservation;Patient/family education;Therapeutic activities;Orthotic Fit/Training;Vasopneumatic Device;Joint Manipulations;Spinal Manipulations    PT Next Visit Plan Progress core and LE strengthening as tolerated.    PT Home Exercise Plan Eval: ab brace, bridge 1/18 ab brace, sidelying hip abduction, seated TR    Consulted and Agree with Plan of Care Patient             Patient will benefit from skilled therapeutic intervention in order to improve the following deficits and impairments:  Pain, Improper body mechanics, Decreased mobility, Decreased activity tolerance, Decreased range of motion, Decreased strength, Decreased endurance, Hypomobility, Decreased balance  Visit Diagnosis: Low back pain, unspecified back pain laterality, unspecified chronicity, unspecified whether sciatica present  Muscle weakness (generalized)  Chronic left shoulder pain     Problem List Patient Active Problem List   Diagnosis Date Noted   COPD (chronic obstructive pulmonary disease) ([place]) 04/23/2021   BPH (benign prostatic hyperplasia) 04/23/2021   CAD (coronary artery disease) 04/23/2021   Frequent nosebleeds 04/23/2021   HOH (hard of hearing)    HTN (hypertension) 09/16/2016   Lumbar spondylosis 11/05/2014   GERD (gastroesophageal reflux disease) 01/19/2012   Mixed hyperlipidemia 04/15/2008   AAA (abdominal aortic aneurysm) 04/15/2008   Teena Irani, PTA/CLT, WTA [phone]  Teena Irani, PTA 06/09/2021, 12:58 PM  [place], [place], [address] Phone: [phone]   Fax:  [phone]  Name: [name] [name] MRN: [id number] Date of Birth: Dec 31, 1940

## 2021-06-10 ENCOUNTER — Encounter (HOSPITAL_COMMUNITY): Payer: Self-pay | Admitting: Radiology

## 2021-06-10 ENCOUNTER — Ambulatory Visit (HOSPITAL_COMMUNITY)
Admission: RE | Admit: 2021-06-10 | Discharge: 2021-06-10 | Disposition: A | Discharge status: Home / Self Care | Payer: Medicare Other | Source: Ambulatory Visit | Attending: Vascular Surgery | Admitting: Vascular Surgery

## 2021-06-10 DIAGNOSIS — I7143 Infrarenal abdominal aortic aneurysm, without rupture: Secondary | ICD-10-CM | POA: Diagnosis not present

## 2021-06-10 DIAGNOSIS — I7409 Other arterial embolism and thrombosis of abdominal aorta: Secondary | ICD-10-CM | POA: Diagnosis not present

## 2021-06-10 DIAGNOSIS — I714 Abdominal aortic aneurysm, without rupture, unspecified: Secondary | ICD-10-CM | POA: Insufficient documentation

## 2021-06-10 DIAGNOSIS — I701 Atherosclerosis of renal artery: Secondary | ICD-10-CM | POA: Diagnosis not present

## 2021-06-10 DIAGNOSIS — K551 Chronic vascular disorders of intestine: Secondary | ICD-10-CM | POA: Diagnosis not present

## 2021-06-10 MED ORDER — IOHEXOL 350 MG/ML SOLN
100.0000 mL | Freq: Once | INTRAVENOUS | Status: AC | PRN
  Administered 2021-06-10: 10:00:00 100 mL via INTRAVENOUS

## 2021-06-11 ENCOUNTER — Other Ambulatory Visit: Payer: Self-pay

## 2021-06-11 ENCOUNTER — Ambulatory Visit (HOSPITAL_COMMUNITY): Payer: Medicare Other | Admitting: Physical Therapy

## 2021-06-11 DIAGNOSIS — M6281 Muscle weakness (generalized): Secondary | ICD-10-CM

## 2021-06-11 DIAGNOSIS — M545 Low back pain, unspecified: Secondary | ICD-10-CM | POA: Diagnosis not present

## 2021-06-11 DIAGNOSIS — M25512 Pain in left shoulder: Secondary | ICD-10-CM | POA: Diagnosis not present

## 2021-06-11 DIAGNOSIS — G8929 Other chronic pain: Secondary | ICD-10-CM | POA: Diagnosis not present

## 2021-06-11 NOTE — Therapy (Signed)
[place], [place], [address] Phone: [phone]   Fax:  [phone]  Physical Therapy Treatment  Patient Details  Name: [name] [name] MRN: [id number] Date of Birth: Nov 25, 1940 Referring Provider (PT): Consuella Lose MD   Encounter Date: 06/11/2021   PT End of Session - 06/11/21 1038     Visit Number 4    Number of Visits 12    Date for PT Re-Evaluation 07/09/21    Authorization Type Medicare A/ [employer] 2ndary    Progress Note Due on Visit 10    PT Start Time 1032    PT Stop Time 1112    PT Time Calculation (min) 40 min    Activity Tolerance Patient tolerated treatment well    Behavior During Therapy [facility] for tasks assessed/performed             Past Medical History:  Diagnosis Date   AAA (abdominal aortic aneurysm)    needs yearly ultrasound   Allergy    Anemia    Arthritis    Asthma    BCC (basal cell carcinoma) 08/18/1989   left shoulder blad, upper right arm, left arm beyond elbow, c&d   BCC (basal cell carcinoma) 01/31/1992   Posterior neck, curetx3, 84fu   BCC (basal cell carcinoma) 11/22/2001   mid forehead, cx3, excision, right forearm, cx3, 71fu   BCC (basal cell carcinoma) 10/09/2003   mid forehead, MOHs   BCC (basal cell carcinoma) 08/15/2008   upper left back, biopsy   BPH (benign prostatic hyperplasia)    CAD (coronary artery disease)    Cancer ([place])    skin cancer   COPD (chronic obstructive pulmonary disease) ([place])    Dysrhythmia    pt. states it can be fast at times   GERD (gastroesophageal reflux disease)    Glaucoma    HOH (hard of hearing)    Hypercholesterolemia    Hypertension    Impaired fasting glucose    Low back pain    Melanoma in situ ([place]) 10/09/2003   left chin, MOHs   MI (myocardial infarction) ([place]) 1999   SCC (squamous cell carcinoma) 07/03/2014   in situ, behind left ear, cx3, cautery, 22fu   SCC (squamous cell carcinoma) 07/03/2014   well diff, left  forearm, biopsy, cx1, cautery   SCC (squamous cell carcinoma) 07/20/2017   in situ, left upper arm, cx3, 6fu   SCC (squamous cell carcinoma) 01/10/2019   in situ, left post shoulder, cx3, 65fu   SCC (squamous cell carcinoma) 11/22/2001   left forearm distal, left forearm, cx3, 6fu   SCC (squamous cell carcinoma) 10/09/2003   [place], left ear post, clear per st, right cheek clear   SCC (squamous cell carcinoma) 03/30/2004   in situ, left upper arm, cx3, 71fu   SCC (squamous cell carcinoma) 03/08/2005   in situ, right cheek, mid upper forehead, cx3, 47fu   SCC (squamous cell carcinoma) 06/08/2006   in situ, left shoulder, cx3, 78fu   SCC (squamous cell carcinoma) 05/05/2010   right inner wrist, biopsy   SCC (squamous cell carcinoma) 09/13/2013   in situ, right crown scalp, front scalp, biopsy   Thrush     Past Surgical History:  Procedure Laterality Date   BACK SURGERY     x 3   CARDIAC CATHETERIZATION     angioplasty   CATARACT EXTRACTION W/PHACO  03/20/2012   Procedure: CATARACT EXTRACTION PHACO AND INTRAOCULAR LENS PLACEMENT ([place]);  Surgeon: Debe Coder  Iona Hansen, MD;  [facility];  Service: Ophthalmology;  Laterality: Right;  CDE:  8.45   CATARACT EXTRACTION W/PHACO Left 04/02/2013   Procedure: CATARACT EXTRACTION PHACO AND INTRAOCULAR LENS PLACEMENT ([facility]);  Surgeon: Williams Che, MD;  Location: [facility];  Service: Ophthalmology;  Laterality: Left;  CDE:  6.50   CHOLECYSTECTOMY  2000   COLONOSCOPY  2009   repeat 5 years   ESOPHAGOGASTRODUODENOSCOPY     HERNIA REPAIR Left    inguinal   INGUINAL HERNIA REPAIR Right 03/28/2020   Procedure: Right Inguinal Herniorrhaphy with Mesh;  Surgeon: Aviva Signs, MD;  Location: [facility];  Service: General;  Laterality: Right;   LAPAROSCOPIC PARTIAL COLECTOMY N/A 06/11/2013   Procedure: LAPAROSCOPIC HAND ASSISTED PARTIAL COLECTOMY;  Surgeon: Jamesetta So, MD;  Location: [facility];  Service: General;  Laterality: N/A;   NASAL ENDOSCOPY WITH  EPISTAXIS CONTROL Bilateral 02/11/2020   Procedure: NASAL ENDOSCOPY WITH EPISTAXIS CONTROL;  Surgeon: Leta Baptist, MD;  Location: [facility];  Service: ENT;  Laterality: Bilateral;   right eye detached retina Bilateral    SPINAL FUSION  8756   YAG LASER APPLICATION Left 43/32/9518   Procedure: YAG LASER APPLICATION;  Surgeon: Williams Che, MD;  Location: [facility];  Service: Ophthalmology;  Laterality: Left;    There were no vitals filed for this visit.   Subjective Assessment - 06/11/21 1037     Subjective Doing well. Compliant with HEP. A little stiff and sore today but no pain currently.    Currently in Pain? No/denies                               [facility] Adult PT Treatment/Exercise - 06/11/21 0001       Lumbar Exercises: Stretches   Lower Trunk Rotation 5 reps;10 seconds      Lumbar Exercises: Standing   Heel Raises 20 reps    Other Standing Lumbar Exercises tandem stance 2 x 30", 4 inch step taps x20    Other Standing Lumbar Exercises FWD step ups on 4 inch step x15 each      Lumbar Exercises: Seated   Sit to Stand 20 reps    Other Seated Lumbar Exercises lumbar flexion 5 x 10" (improved lumbar stiffness)      Lumbar Exercises: Supine   Ab Set 10 reps;5 seconds    Bent Knee Raise 20 reps    Bridge 20 reps    Straight Leg Raise 10 reps    Straight Leg Raises Limitations with ab brace                       PT Short Term Goals - 05/28/21 1111       PT SHORT TERM GOAL #1   Title Patient will be independent with initial HEP and self-management strategies to improve functional outcomes    Time 3    Period Weeks    Status New    Target Date 06/18/21               PT Long Term Goals - 05/28/21 1112       PT LONG TERM GOAL #1   Title Patient will be able to ambulate at least 350 feet during 2MWT with LRAD to demonstrate improved ability to perform functional mobility and associated tasks.    Time 6    Period Weeks     Status New  Target Date 07/09/21      PT LONG TERM GOAL #2   Title Patient will improve FOTO score by at least 5 points to indicate improvement in functional outcomes    Time 6    Period Weeks    Status New    Target Date 07/09/21      PT LONG TERM GOAL #3   Title Patient will have equal to or > 4/5 MMT throughout BLE (except LT ankle DF)  to improve ability to perform functional mobility, stair ambulation and ADLs.    Time 6    Period Weeks    Status New    Target Date 07/09/21      PT LONG TERM GOAL #4   Title Patient will be able to maintain tandem stance >30 seconds on BLEs to improve stability and reduce risk for falls    Time 6    Period Weeks    Status New    Target Date 07/09/21                   Plan - 06/11/21 1108     Clinical Impression Statement Patient tolerated session well. Progressed LE strengthening in standing as well as standing balance. Patient well challenged with balance activity and requires cues for foot position for improved stability. Patient noting increased fatigue end of session. Patient will continue to benefit from skilled therapy services to reduce deficits and improve functional level.    Personal Factors and Comorbidities Comorbidity 3+;Time since onset of injury/illness/exacerbation    Examination-Activity Limitations Lift;Stand;Locomotion Level;Transfers;Carry;Stairs    Examination-Participation Restrictions Laundry;Shop;Community Activity;Cleaning;Yard Work;Meal Prep    Stability/Clinical Decision Making Stable/Uncomplicated    Rehab Potential Good    PT Frequency 2x / week    PT Duration 6 weeks    PT Treatment/Interventions ADLs/Self Care Home Management;Biofeedback;Traction;Moist Heat;Stair training;Gait training;Neuromuscular re-education;Balance training;DME Instruction;Iontophoresis 4mg /ml Dexamethasone;Scar mobilization;Visual/perceptual remediation/compensation;Passive range of motion;Dry needling;Manual  techniques;Functional mobility training;Ultrasound;Parrafin;Fluidtherapy;Cryotherapy;Electrical Stimulation;Contrast Bath;Therapeutic exercise;Splinting;Taping;Compression bandaging;Energy conservation;Patient/family education;Therapeutic activities;Orthotic Fit/Training;Vasopneumatic Device;Joint Manipulations;Spinal Manipulations    PT Next Visit Plan Progress core and LE strengthening as tolerated.    PT Home Exercise Plan Eval: ab brace, bridge 1/18 ab brace, sidelying hip abduction, seated TR 1/26 tandem stance, sidestepping, sit to stand    Consulted and Agree with Plan of Care Patient             Patient will benefit from skilled therapeutic intervention in order to improve the following deficits and impairments:  Pain, Improper body mechanics, Decreased mobility, Decreased activity tolerance, Decreased range of motion, Decreased strength, Decreased endurance, Hypomobility, Decreased balance  Visit Diagnosis: Low back pain, unspecified back pain laterality, unspecified chronicity, unspecified whether sciatica present  Muscle weakness (generalized)     Problem List Patient Active Problem List   Diagnosis Date Noted   COPD (chronic obstructive pulmonary disease) ([place]) 04/23/2021   BPH (benign prostatic hyperplasia) 04/23/2021   CAD (coronary artery disease) 04/23/2021   Frequent nosebleeds 04/23/2021   HOH (hard of hearing)    HTN (hypertension) 09/16/2016   Lumbar spondylosis 11/05/2014   GERD (gastroesophageal reflux disease) 01/19/2012   Mixed hyperlipidemia 04/15/2008   AAA (abdominal aortic aneurysm) 04/15/2008   11:12 AM, 06/11/21 Josue Hector PT DPT  Physical Therapist with [facility]  [phone] [place] [address] [place], [place], [address] Phone: [phone]   Fax:  [phone]  Name: [name] [name] MRN: [id number] Date of Birth: 20-Apr-1941

## 2021-06-11 NOTE — Patient Instructions (Signed)
Access Code: [email or web address] URL: [email or web address] Date: 06/11/2021 Prepared by: Josue Hector  Exercises Sit to Stand Without Arm Support - 2-3 x daily - 7 x weekly - 2 sets - 10 reps Side Stepping with Counter Support - 2-3 x daily - 7 x weekly - 1 sets - 10 reps Standing Tandem Balance with Counter Support - 2-3 x daily - 7 x weekly - 1 sets - 3 reps - 30 second hold

## 2021-06-17 ENCOUNTER — Ambulatory Visit: Payer: Medicare Other | Admitting: Vascular Surgery

## 2021-06-17 ENCOUNTER — Encounter (HOSPITAL_COMMUNITY): Payer: Medicare Other | Admitting: Physical Therapy

## 2021-06-18 ENCOUNTER — Other Ambulatory Visit: Payer: Self-pay | Admitting: Family Medicine

## 2021-06-18 DIAGNOSIS — G4700 Insomnia, unspecified: Secondary | ICD-10-CM

## 2021-06-19 ENCOUNTER — Encounter (HOSPITAL_COMMUNITY): Payer: Medicare Other | Admitting: Physical Therapy

## 2021-06-22 DIAGNOSIS — Z08 Encounter for follow-up examination after completed treatment for malignant neoplasm: Secondary | ICD-10-CM | POA: Diagnosis not present

## 2021-06-22 DIAGNOSIS — Z85828 Personal history of other malignant neoplasm of skin: Secondary | ICD-10-CM | POA: Diagnosis not present

## 2021-06-22 DIAGNOSIS — X32XXXD Exposure to sunlight, subsequent encounter: Secondary | ICD-10-CM | POA: Diagnosis not present

## 2021-06-22 DIAGNOSIS — L218 Other seborrheic dermatitis: Secondary | ICD-10-CM | POA: Diagnosis not present

## 2021-06-22 DIAGNOSIS — L570 Actinic keratosis: Secondary | ICD-10-CM | POA: Diagnosis not present

## 2021-06-23 ENCOUNTER — Other Ambulatory Visit: Payer: Self-pay

## 2021-06-23 ENCOUNTER — Ambulatory Visit (HOSPITAL_COMMUNITY): Payer: Medicare Other | Attending: Family Medicine | Admitting: Physical Therapy

## 2021-06-23 DIAGNOSIS — M25512 Pain in left shoulder: Secondary | ICD-10-CM | POA: Diagnosis not present

## 2021-06-23 DIAGNOSIS — M6281 Muscle weakness (generalized): Secondary | ICD-10-CM | POA: Diagnosis not present

## 2021-06-23 DIAGNOSIS — G8929 Other chronic pain: Secondary | ICD-10-CM | POA: Insufficient documentation

## 2021-06-23 DIAGNOSIS — M545 Low back pain, unspecified: Secondary | ICD-10-CM | POA: Insufficient documentation

## 2021-06-23 NOTE — Therapy (Signed)
[place], [place], [address] Phone: [phone]   Fax:  [phone]  Physical Therapy Treatment  Patient Details  Name: ELDAR ROBITAILLE MRN: [id number] Date of Birth: 1940/07/02 Referring Provider (PT): Consuella Lose MD   Encounter Date: 06/23/2021   PT End of Session - 06/23/21 1134     Visit Number 5    Number of Visits 12    Date for PT Re-Evaluation 07/09/21    Authorization Type Medicare A/ AARP 2ndary    Progress Note Due on Visit 10    PT Start Time 1040    PT Stop Time 1125    PT Time Calculation (min) 45 min    Activity Tolerance Patient tolerated treatment well    Behavior During Therapy [facility] for tasks assessed/performed             Past Medical History:  Diagnosis Date   AAA (abdominal aortic aneurysm)    needs yearly ultrasound   Allergy    Anemia    Arthritis    Asthma    BCC (basal cell carcinoma) 08/18/1989   left shoulder blad, upper right arm, left arm beyond elbow, c&d   BCC (basal cell carcinoma) 01/31/1992   Posterior neck, curetx3, 46fu   BCC (basal cell carcinoma) 11/22/2001   mid forehead, cx3, excision, right forearm, cx3, 53fu   BCC (basal cell carcinoma) 10/09/2003   mid forehead, MOHs   BCC (basal cell carcinoma) 08/15/2008   upper left back, biopsy   BPH (benign prostatic hyperplasia)    CAD (coronary artery disease)    Cancer ([place])    skin cancer   COPD (chronic obstructive pulmonary disease) ([place])    Dysrhythmia    pt. states it can be fast at times   GERD (gastroesophageal reflux disease)    Glaucoma    HOH (hard of hearing)    Hypercholesterolemia    Hypertension    Impaired fasting glucose    Low back pain    Melanoma in situ ([place]) 10/09/2003   left chin, MOHs   MI (myocardial infarction) ([place]) 1999   SCC (squamous cell carcinoma) 07/03/2014   in situ, behind left ear, cx3, cautery, 53fu   SCC (squamous cell carcinoma) 07/03/2014   well diff, left  forearm, biopsy, cx1, cautery   SCC (squamous cell carcinoma) 07/20/2017   in situ, left upper arm, cx3, 58fu   SCC (squamous cell carcinoma) 01/10/2019   in situ, left post shoulder, cx3, 56fu   SCC (squamous cell carcinoma) 11/22/2001   left forearm distal, left forearm, cx3, 100fu   SCC (squamous cell carcinoma) 10/09/2003   [place], left ear post, clear per st, right cheek clear   SCC (squamous cell carcinoma) 03/30/2004   in situ, left upper arm, cx3, 20fu   SCC (squamous cell carcinoma) 03/08/2005   in situ, right cheek, mid upper forehead, cx3, 52fu   SCC (squamous cell carcinoma) 06/08/2006   in situ, left shoulder, cx3, 91fu   SCC (squamous cell carcinoma) 05/05/2010   right inner wrist, biopsy   SCC (squamous cell carcinoma) 09/13/2013   in situ, right crown scalp, front scalp, biopsy   Thrush     Past Surgical History:  Procedure Laterality Date   BACK SURGERY     x 3   CARDIAC CATHETERIZATION     angioplasty   CATARACT EXTRACTION W/PHACO  03/20/2012   Procedure: CATARACT EXTRACTION PHACO AND INTRAOCULAR LENS PLACEMENT ([place]);  Surgeon: Debe Coder  Iona Hansen, MD;  Location: [facility];  Service: Ophthalmology;  Laterality: Right;  CDE:  8.45   CATARACT EXTRACTION W/PHACO Left 04/02/2013   Procedure: CATARACT EXTRACTION PHACO AND INTRAOCULAR LENS PLACEMENT ([facility]);  Surgeon: Williams Che, MD;  Location: [facility];  Service: Ophthalmology;  Laterality: Left;  CDE:  6.50   CHOLECYSTECTOMY  2000   COLONOSCOPY  2009   repeat 5 years   ESOPHAGOGASTRODUODENOSCOPY     HERNIA REPAIR Left    inguinal   INGUINAL HERNIA REPAIR Right 03/28/2020   Procedure: Right Inguinal Herniorrhaphy with Mesh;  Surgeon: Aviva Signs, MD;  Location: [facility];  Service: General;  Laterality: Right;   LAPAROSCOPIC PARTIAL COLECTOMY N/A 06/11/2013   Procedure: LAPAROSCOPIC HAND ASSISTED PARTIAL COLECTOMY;  Surgeon: Jamesetta So, MD;  Location: [facility];  Service: General;  Laterality: N/A;   NASAL ENDOSCOPY WITH  EPISTAXIS CONTROL Bilateral 02/11/2020   Procedure: NASAL ENDOSCOPY WITH EPISTAXIS CONTROL;  Surgeon: Leta Baptist, MD;  Location: [place];  Service: ENT;  Laterality: Bilateral;   right eye detached retina Bilateral    SPINAL FUSION  2778   YAG LASER APPLICATION Left 24/23/5361   Procedure: YAG LASER APPLICATION;  Surgeon: Williams Che, MD;  Location: [facility];  Service: Ophthalmology;  Laterality: Left;    There were no vitals filed for this visit.   Subjective Assessment - 06/23/21 1053     Subjective pt states he was "under the weather" last week and didnt do much    Currently in Pain? No/denies                               [facility] Adult PT Treatment/Exercise - 06/23/21 0001       Lumbar Exercises: Standing   Heel Raises 20 reps    Forward Lunge 10 reps    Forward Lunge Limitations 2 sets onto 6" step no UE assist.    Other Standing Lumbar Exercises tandem stance 2 x 30", 4 inch step taps x20, hip abduction and extension 2X10 each    Other Standing Lumbar Exercises FWD step ups on 6 inch step 2 x10 each      Lumbar Exercises: Seated   Sit to Stand 20 reps    Sit to Stand Limitations 2 X10                       PT Short Term Goals - 05/28/21 1111       PT SHORT TERM GOAL #1   Title Patient will be independent with initial HEP and self-management strategies to improve functional outcomes    Time 3    Period Weeks    Status New    Target Date 06/18/21               PT Long Term Goals - 05/28/21 1112       PT LONG TERM GOAL #1   Title Patient will be able to ambulate at least 350 feet during 2MWT with LRAD to demonstrate improved ability to perform functional mobility and associated tasks.    Time 6    Period Weeks    Status New    Target Date 07/09/21      PT LONG TERM GOAL #2   Title Patient will improve FOTO score by at least 5 points to indicate improvement in functional outcomes    Time 6    Period  Weeks    Status New    Target Date 07/09/21      PT LONG TERM GOAL #3   Title Patient will have equal to or > 4/5 MMT throughout BLE (except LT ankle DF)  to improve ability to perform functional mobility, stair ambulation and ADLs.    Time 6    Period Weeks    Status New    Target Date 07/09/21      PT LONG TERM GOAL #4   Title Patient will be able to maintain tandem stance >30 seconds on BLEs to improve stability and reduce risk for falls    Time 6    Period Weeks    Status New    Target Date 07/09/21                   Plan - 06/23/21 1134     Clinical Impression Statement PT with respiratory issues last week and admits to not doing any exercises and canceling both of his therapy appointments.  States his LT foot/ankle feels insecure at times.  Resumed therex today with ability to self correct LOB. Added standing hip strengthening and lunges to work on LE stabilization and strength.   Noted shortness of breath at times and need for rest breaks. Encouraged to resume HEP.    Personal Factors and Comorbidities Comorbidity 3+;Time since onset of injury/illness/exacerbation    Examination-Activity Limitations Lift;Stand;Locomotion Level;Transfers;Carry;Stairs    Examination-Participation Restrictions Laundry;Shop;Community Activity;Cleaning;Yard Work;Meal Prep    Stability/Clinical Decision Making Stable/Uncomplicated    Rehab Potential Good    PT Frequency 2x / week    PT Duration 6 weeks    PT Treatment/Interventions ADLs/Self Care Home Management;Biofeedback;Traction;Moist Heat;Stair training;Gait training;Neuromuscular re-education;Balance training;DME Instruction;Iontophoresis 4mg /ml Dexamethasone;Scar mobilization;Visual/perceptual remediation/compensation;Passive range of motion;Dry needling;Manual techniques;Functional mobility training;Ultrasound;Parrafin;Fluidtherapy;Cryotherapy;Electrical Stimulation;Contrast Bath;Therapeutic exercise;Splinting;Taping;Compression  bandaging;Energy conservation;Patient/family education;Therapeutic activities;Orthotic Fit/Training;Vasopneumatic Device;Joint Manipulations;Spinal Manipulations    PT Next Visit Plan Progress core and LE strengthening as tolerated.    PT Home Exercise Plan Eval: ab brace, bridge 1/18 ab brace, sidelying hip abduction, seated TR 1/26 tandem stance, sidestepping, sit to stand    Consulted and Agree with Plan of Care Patient             Patient will benefit from skilled therapeutic intervention in order to improve the following deficits and impairments:  Pain, Improper body mechanics, Decreased mobility, Decreased activity tolerance, Decreased range of motion, Decreased strength, Decreased endurance, Hypomobility, Decreased balance  Visit Diagnosis: Low back pain, unspecified back pain laterality, unspecified chronicity, unspecified whether sciatica present  Muscle weakness (generalized)     Problem List Patient Active Problem List   Diagnosis Date Noted   COPD (chronic obstructive pulmonary disease) ([place]) 04/23/2021   BPH (benign prostatic hyperplasia) 04/23/2021   CAD (coronary artery disease) 04/23/2021   Frequent nosebleeds 04/23/2021   HOH (hard of hearing)    HTN (hypertension) 09/16/2016   Lumbar spondylosis 11/05/2014   GERD (gastroesophageal reflux disease) 01/19/2012   Mixed hyperlipidemia 04/15/2008   AAA (abdominal aortic aneurysm) 04/15/2008   Teena Irani, PTA/CLT, WTA [phone]  Teena Irani, PTA 06/23/2021, 11:35 AM  [place] [address] [place], [place], [address] Phone: [phone]   Fax:  [phone]  Name: VISHWA DAIS MRN: [id number] Date of Birth: Nov 07, 1940

## 2021-06-24 ENCOUNTER — Ambulatory Visit (INDEPENDENT_AMBULATORY_CARE_PROVIDER_SITE_OTHER): Payer: Medicare Other | Admitting: Vascular Surgery

## 2021-06-24 ENCOUNTER — Encounter: Payer: Self-pay | Admitting: Vascular Surgery

## 2021-06-24 VITALS — BP 121/76 | HR 73 | Temp 97.9°F | Resp 15 | Ht 73.0 in | Wt 201.8 lb

## 2021-06-24 DIAGNOSIS — I714 Abdominal aortic aneurysm, without rupture, unspecified: Secondary | ICD-10-CM

## 2021-06-24 NOTE — Progress Notes (Signed)
[facility] [place]  Patient name: [name] [name] MRN: [id number] DOB: 1940-10-04 Sex: male  REASON FOR VISIT: Follow-up known infrarenal abdominal aortic aneurysm  HPI: [name] [name] is a 81 y.o. male here today for follow-up.  He continues to be in stable health.  He does have COPD but reports that this is stable for him.  He has no symptoms referable to his aneurysm.  Has had no new cardiac difficulty.  Past Medical History:  Diagnosis Date   AAA (abdominal aortic aneurysm)    needs yearly ultrasound   Allergy    Anemia    Arthritis    Asthma    BCC (basal cell carcinoma) 08/18/1989   left shoulder blad, upper right arm, left arm beyond elbow, c&d   BCC (basal cell carcinoma) 01/31/1992   Posterior neck, curetx3, 72fu   BCC (basal cell carcinoma) 11/22/2001   mid forehead, cx3, excision, right forearm, cx3, 44fu   BCC (basal cell carcinoma) 10/09/2003   mid forehead, MOHs   BCC (basal cell carcinoma) 08/15/2008   upper left back, biopsy   BPH (benign prostatic hyperplasia)    CAD (coronary artery disease)    Cancer (HCC)    skin cancer   COPD (chronic obstructive pulmonary disease) ([place])    Dysrhythmia    pt. states it can be fast at times   GERD (gastroesophageal reflux disease)    Glaucoma    HOH (hard of hearing)    Hypercholesterolemia    Hypertension    Impaired fasting glucose    Low back pain    Melanoma in situ ([place]) 10/09/2003   left chin, MOHs   MI (myocardial infarction) ([place]) 1999   SCC (squamous cell carcinoma) 07/03/2014   in situ, behind left ear, cx3, cautery, 20fu   SCC (squamous cell carcinoma) 07/03/2014   well diff, left forearm, biopsy, cx1, cautery   SCC (squamous cell carcinoma) 07/20/2017   in situ, left upper arm, cx3, 42fu   SCC (squamous cell carcinoma) 01/10/2019   in situ, left post shoulder, cx3, 38fu   SCC (squamous cell carcinoma) 11/22/2001   left forearm distal, left  forearm, cx3, 77fu   SCC (squamous cell carcinoma) 10/09/2003   [place], left ear post, clear per st, right cheek clear   SCC (squamous cell carcinoma) 03/30/2004   in situ, left upper arm, cx3, 25fu   SCC (squamous cell carcinoma) 03/08/2005   in situ, right cheek, mid upper forehead, cx3, 78fu   SCC (squamous cell carcinoma) 06/08/2006   in situ, left shoulder, cx3, 13fu   SCC (squamous cell carcinoma) 05/05/2010   right inner wrist, biopsy   SCC (squamous cell carcinoma) 09/13/2013   in situ, right crown scalp, front scalp, biopsy   Thrush     Family History  Problem Relation Age of Onset   Hypertension Mother    COPD Father    Cancer Brother        brain    SOCIAL HISTORY: Social History   Tobacco Use   Smoking status: Former    Packs/day: 1.00    Years: 35.00    Pack years: 35.00    Types: Cigarettes    Quit date: 03/28/2003    Years since quitting: 18.2   Smokeless tobacco: Never  Substance Use Topics   Alcohol use: No    Alcohol/week: 0.0 standard drinks    Allergies  Allergen Reactions   Beta Adrenergic Blockers Diarrhea and Other (See Comments)  Does not recall an allergy   Lasix [Furosemide] Rash   Cefzil [Cefprozil] Nausea Only    Patient does recall allergy   Ciprofloxacin Nausea And Vomiting, Rash and Other (See Comments)    Body aches    Dexamethasone Swelling   Gabapentin Swelling   Methocarbamol Swelling and Rash   Neomycin Other (See Comments)   Penicillins Swelling and Rash    Has patient had a PCN reaction causing immediate rash, facial/tongue/throat swelling, SOB or lightheadedness with hypotension: yes Has patient had a PCN reaction causing severe rash involving mucus membranes or skin necrosis: unknown Has patient had a PCN reaction that required hospitalization: no Has patient had a PCN reaction occurring within the last 10 years: No If all of the above answers are "NO", then may proceed with Cephalosporin use.    Tetracyclines &  Related Itching    Current Outpatient Medications  Medication Sig Dispense Refill   albuterol (PROVENTIL) (2.5 MG/3ML) 0.083% nebulizer solution Take 3 mLs (2.5 mg total) by nebulization every 6 (six) hours as needed for wheezing or shortness of breath. 100 mL 2   albuterol (VENTOLIN HFA) 108 (90 Base) MCG/ACT inhaler 2 puffs as needed     alfuzosin (UROXATRAL) 10 MG 24 hr tablet Take 1 tablet (10 mg total) by mouth daily with breakfast. 90 tablet 3   azithromycin (ZITHROMAX) 250 MG tablet Take 1 tablet (250 mg total) by mouth daily. Take 2 tablets on day 1 and 1 tablet daily for 4 days 6 tablet 0   cetirizine (ZYRTEC) 10 MG tablet Take 10 mg by mouth daily.     COMBIGAN 0.2-0.5 % ophthalmic solution Apply 1 drop to eye 2 (two) times daily.     esomeprazole (NEXIUM) 40 MG capsule Take 40 mg by mouth daily.     fluticasone (FLOVENT HFA) 220 MCG/ACT inhaler Inhale 2 puffs into the lungs in the morning and at bedtime. 12 g 5   hydroxypropyl methylcellulose / hypromellose (ISOPTO TEARS / GONIOVISC) 2.5 % ophthalmic solution Place 1 drop into both eyes 3 (three) times daily as needed for dry eyes.     LORazepam (ATIVAN) 0.5 MG tablet TAKE 1 TABLET BY MOUTH ONCE DAILY AT BEDTIME. 30 tablet 3   losartan (COZAAR) 25 MG tablet Take 1 tablet (25 mg total) by mouth daily. 90 tablet 1   lovastatin (MEVACOR) 40 MG tablet Take 1 tablet (40 mg total) by mouth daily. 90 tablet 1   nitroGLYCERIN (NITROSTAT) 0.4 MG SL tablet Place 1 tablet (0.4 mg total) under the tongue every 5 (five) minutes as needed for chest pain. 25 tablet 5   oxymetazoline (AFRIN) 0.05 % nasal spray Place 1 spray into both nostrils 2 (two) times daily.     predniSONE (DELTASONE) 10 MG tablet Take 1 tablet daily for up to 7 days 7 tablet 0   No current facility-administered medications for this visit.    REVIEW OF SYSTEMS:  [X]  denotes positive finding, [ ]  denotes negative finding Cardiac  Comments:  Chest pain or chest pressure:     Shortness of breath upon exertion: x   Short of breath when lying flat:    Irregular heart rhythm:        Vascular    Pain in calf, thigh, or hip brought on by ambulation:    Pain in feet at night that wakes you up from your sleep:     Blood clot in your veins:    Leg swelling:  PHYSICAL EXAM: Vitals:   06/24/21 1109  BP: 121/76  Pulse: 73  Resp: 15  Temp: 97.9 F (36.6 C)  TempSrc: Temporal  SpO2: 96%  Weight: 201 lb 12.8 oz (91.5 kg)  Height: 6\' 1"  (1.854 m)    GENERAL: The patient is a well-nourished male, in no acute distress. The vital signs are documented above. CARDIOVASCULAR: 2+ radial and 2+ femoral pulses bilaterally.  I do not palpate an abdominal aortic aneurysm. PULMONARY: There is good air exchange  MUSCULOSKELETAL: There are no major deformities or cyanosis. NEUROLOGIC: No focal weakness or paresthesias are detected. SKIN: There are no ulcers or rashes noted. PSYCHIATRIC: The patient has a normal affect.  DATA:  CT scan from 06/10/2021 was reviewed with the patient.  This shows no change in his maximal aneurysm size of 5.1 cm.  His last scan was on 11/27/2020.  He does have a relatively long infrarenal aortic neck.  He does have mural thrombus in his infrarenal aorta.  CT scan on 06/30/2017 revealed maximal diameter at that time of 4.0 cm  MEDICAL ISSUES: Had long discussion with the patient regarding his aortic aneurysm.  I discussed symptoms of leaking aneurysm and his need to report immediately to the emergency room via 911.  Also explained that is encouraging that he has had no growth by CT scan in 6 months.  I would recommend ultrasound of his aorta in 6 months for continued follow-up.  I discussed open and stent graft repair of abdominal aortic aneurysm explained that his infrarenal mural thrombus was concerning but probably would not preclude him from aneurysm stent graft repair.  He would be at increased risk of open repair due to his pulmonary  status.  We will see him again in 6 months with ultrasound    Rosetta Posner, MD [facility] [phone]  Note: Portions of this report may have been transcribed using voice recognition software.  Every effort has been made to ensure accuracy; however, inadvertent computerized transcription errors may still be present.

## 2021-06-25 ENCOUNTER — Other Ambulatory Visit: Payer: Self-pay

## 2021-06-25 ENCOUNTER — Encounter (HOSPITAL_COMMUNITY): Payer: Self-pay | Admitting: Physical Therapy

## 2021-06-25 ENCOUNTER — Ambulatory Visit (HOSPITAL_COMMUNITY): Payer: Medicare Other | Admitting: Physical Therapy

## 2021-06-25 DIAGNOSIS — M25512 Pain in left shoulder: Secondary | ICD-10-CM | POA: Diagnosis not present

## 2021-06-25 DIAGNOSIS — M6281 Muscle weakness (generalized): Secondary | ICD-10-CM

## 2021-06-25 DIAGNOSIS — G8929 Other chronic pain: Secondary | ICD-10-CM | POA: Diagnosis not present

## 2021-06-25 DIAGNOSIS — M545 Low back pain, unspecified: Secondary | ICD-10-CM

## 2021-06-25 NOTE — Patient Instructions (Signed)
Access Code: [email or web address] URL: [email or web address] Date: 06/25/2021 Prepared by: Josue Hector  Exercises Standing Hip Abduction with Counter Support - 2-3 x daily - 7 x weekly - 2 sets - 10 reps

## 2021-06-25 NOTE — Therapy (Signed)
[place], [place], [address] Phone: [phone]   Fax:  [phone]  Physical Therapy Treatment  Patient Details  Name: [name] [name] MRN: [id number] Date of Birth: [birth year] Referring Provider (PT): Consuella Lose MD   Encounter Date: 06/25/2021   PT End of Session - 06/25/21 1115     Visit Number 6    Number of Visits 12    Date for PT Re-Evaluation 07/09/21    Authorization Type Medicare A/ [employer] 2ndary    Progress Note Due on Visit 10    PT Start Time 1116    PT Stop Time 1200    PT Time Calculation (min) 44 min    Activity Tolerance Patient tolerated treatment well    Behavior During Therapy [facility] for tasks assessed/performed             Past Medical History:  Diagnosis Date   AAA (abdominal aortic aneurysm)    needs yearly ultrasound   Allergy    Anemia    Arthritis    Asthma    BCC (basal cell carcinoma) 08/18/1989   left shoulder blad, upper right arm, left arm beyond elbow, c&d   BCC (basal cell carcinoma) 01/31/1992   Posterior neck, curetx3, 64fu   BCC (basal cell carcinoma) 11/22/2001   mid forehead, cx3, excision, right forearm, cx3, 39fu   BCC (basal cell carcinoma) 10/09/2003   mid forehead, MOHs   BCC (basal cell carcinoma) 08/15/2008   upper left back, biopsy   BPH (benign prostatic hyperplasia)    CAD (coronary artery disease)    Cancer ([place])    skin cancer   COPD (chronic obstructive pulmonary disease) ([place])    Dysrhythmia    pt. states it can be fast at times   GERD (gastroesophageal reflux disease)    Glaucoma    HOH (hard of hearing)    Hypercholesterolemia    Hypertension    Impaired fasting glucose    Low back pain    Melanoma in situ ([place]) 10/09/2003   left chin, MOHs   MI (myocardial infarction) ([place]) 1999   SCC (squamous cell carcinoma) 07/03/2014   in situ, behind left ear, cx3, cautery, 20fu   SCC (squamous cell carcinoma) 07/03/2014   well diff, left  forearm, biopsy, cx1, cautery   SCC (squamous cell carcinoma) 07/20/2017   in situ, left upper arm, cx3, 16fu   SCC (squamous cell carcinoma) 01/10/2019   in situ, left post shoulder, cx3, 67fu   SCC (squamous cell carcinoma) 11/22/2001   left forearm distal, left forearm, cx3, 75fu   SCC (squamous cell carcinoma) 10/09/2003   [place], left ear post, clear per st, right cheek clear   SCC (squamous cell carcinoma) 03/30/2004   in situ, left upper arm, cx3, 9fu   SCC (squamous cell carcinoma) 03/08/2005   in situ, right cheek, mid upper forehead, cx3, 29fu   SCC (squamous cell carcinoma) 06/08/2006   in situ, left shoulder, cx3, 74fu   SCC (squamous cell carcinoma) 05/05/2010   right inner wrist, biopsy   SCC (squamous cell carcinoma) 09/13/2013   in situ, right crown scalp, front scalp, biopsy   Thrush     Past Surgical History:  Procedure Laterality Date   BACK SURGERY     x 3   CARDIAC CATHETERIZATION     angioplasty   CATARACT EXTRACTION W/PHACO  03/20/2012   Procedure: CATARACT EXTRACTION PHACO AND INTRAOCULAR LENS PLACEMENT ([place]);  Surgeon: Debe Coder  Iona Hansen, MD;  Location: [facility];  Service: Ophthalmology;  Laterality: Right;  CDE:  8.45   CATARACT EXTRACTION W/PHACO Left 04/02/2013   Procedure: CATARACT EXTRACTION PHACO AND INTRAOCULAR LENS PLACEMENT ([facility]);  Surgeon: Williams Che, MD;  Location: [facility];  Service: Ophthalmology;  Laterality: Left;  CDE:  6.50   CHOLECYSTECTOMY  2000   COLONOSCOPY  2009   repeat 5 years   ESOPHAGOGASTRODUODENOSCOPY     HERNIA REPAIR Left    inguinal   INGUINAL HERNIA REPAIR Right 03/28/2020   Procedure: Right Inguinal Herniorrhaphy with Mesh;  Surgeon: Aviva Signs, MD;  Location: [facility];  Service: General;  Laterality: Right;   LAPAROSCOPIC PARTIAL COLECTOMY N/A 06/11/2013   Procedure: LAPAROSCOPIC HAND ASSISTED PARTIAL COLECTOMY;  Surgeon: Jamesetta So, MD;  Location: [facility];  Service: General;  Laterality: N/A;   NASAL ENDOSCOPY WITH  EPISTAXIS CONTROL Bilateral 02/11/2020   Procedure: NASAL ENDOSCOPY WITH EPISTAXIS CONTROL;  Surgeon: Leta Baptist, MD;  Location: [place];  Service: ENT;  Laterality: Bilateral;   right eye detached retina Bilateral    SPINAL FUSION  1829   YAG LASER APPLICATION Left 93/71/6967   Procedure: YAG LASER APPLICATION;  Surgeon: Williams Che, MD;  Location: [facility];  Service: Ophthalmology;  Laterality: Left;    There were no vitals filed for this visit.   Subjective Assessment - 06/25/21 1121     Subjective Doing ok. Feeling better than last week. Has some pain in LT hip today.    Currently in Pain? Yes    Pain Score 4     Pain Location Hip    Pain Orientation Left    Pain Descriptors / Indicators Sharp    Pain Type Acute pain                               [facility] Adult PT Treatment/Exercise - 06/25/21 0001       Exercises   Exercises Knee/Hip      Lumbar Exercises: Standing   Heel Raises --   30 reps     Lumbar Exercises: Seated   Sit to Stand 20 reps    Sit to Stand Limitations 2 X10      Knee/Hip Exercises: Standing   Hip Flexion 3 sets;10 reps    Hip Flexion Limitations 3#    Hip Abduction 3 sets;10 reps    Abduction Limitations 3#    Hip Extension 3 sets;10 reps    Extension Limitations 3#    Forward Step Up Both;1 set;15 reps;Hand Hold: 2;Step Height: 6"    Other Standing Knee Exercises tandem stance 3 x 30"    Other Standing Knee Exercises Lumbar extension in standing x5, do difference on LLE weakness                       PT Short Term Goals - 05/28/21 1111       PT SHORT TERM GOAL #1   Title Patient will be independent with initial HEP and self-management strategies to improve functional outcomes    Time 3    Period Weeks    Status New    Target Date 06/18/21               PT Long Term Goals - 05/28/21 1112       PT LONG TERM GOAL #1   Title Patient will be able to ambulate at  least 350 feet  during 2MWT with LRAD to demonstrate improved ability to perform functional mobility and associated tasks.    Time 6    Period Weeks    Status New    Target Date 07/09/21      PT LONG TERM GOAL #2   Title Patient will improve FOTO score by at least 5 points to indicate improvement in functional outcomes    Time 6    Period Weeks    Status New    Target Date 07/09/21      PT LONG TERM GOAL #3   Title Patient will have equal to or > 4/5 MMT throughout BLE (except LT ankle DF)  to improve ability to perform functional mobility, stair ambulation and ADLs.    Time 6    Period Weeks    Status New    Target Date 07/09/21      PT LONG TERM GOAL #4   Title Patient will be able to maintain tandem stance >30 seconds on BLEs to improve stability and reduce risk for falls    Time 6    Period Weeks    Status New    Target Date 07/09/21                   Plan - 06/25/21 1553     Clinical Impression Statement Patient tolerated session well today. Progressed LE strength with added ankle weights to standing hip exercise. Patient with good tolerance. Patient continues to be limited by decreased balance impacted by ongoing LT hip weakness. Trialed lumbar extension in standing in attempt to reduce suspected radicular component with no effect. Issued updated HEP handout. Patient will continue to benefit from skilled therapy services to reduce deficits and improve functional ability.    Personal Factors and Comorbidities Comorbidity 3+;Time since onset of injury/illness/exacerbation    Examination-Activity Limitations Lift;Stand;Locomotion Level;Transfers;Carry;Stairs    Examination-Participation Restrictions Laundry;Shop;Community Activity;Cleaning;Yard Work;Meal Prep    Stability/Clinical Decision Making Stable/Uncomplicated    Rehab Potential Good    PT Frequency 2x / week    PT Duration 6 weeks    PT Treatment/Interventions ADLs/Self Care Home Management;Biofeedback;Traction;Moist  Heat;Stair training;Gait training;Neuromuscular re-education;Balance training;DME Instruction;Iontophoresis 4mg /ml Dexamethasone;Scar mobilization;Visual/perceptual remediation/compensation;Passive range of motion;Dry needling;Manual techniques;Functional mobility training;Ultrasound;Parrafin;Fluidtherapy;Cryotherapy;Electrical Stimulation;Contrast Bath;Therapeutic exercise;Splinting;Taping;Compression bandaging;Energy conservation;Patient/family education;Therapeutic activities;Orthotic Fit/Training;Vasopneumatic Device;Joint Manipulations;Spinal Manipulations    PT Next Visit Plan Progress core and LE strengthening as tolerated.    PT Home Exercise Plan Eval: ab brace, bridge 1/18 ab brace, sidelying hip abduction, seated TR 1/26 tandem stance, sidestepping, sit to stand 2/9 standing hip abduction    Consulted and Agree with Plan of Care Patient             Patient will benefit from skilled therapeutic intervention in order to improve the following deficits and impairments:  Pain, Improper body mechanics, Decreased mobility, Decreased activity tolerance, Decreased range of motion, Decreased strength, Decreased endurance, Hypomobility, Decreased balance  Visit Diagnosis: Low back pain, unspecified back pain laterality, unspecified chronicity, unspecified whether sciatica present  Muscle weakness (generalized)     Problem List Patient Active Problem List   Diagnosis Date Noted   COPD (chronic obstructive pulmonary disease) ([place]) 04/23/2021   BPH (benign prostatic hyperplasia) 04/23/2021   CAD (coronary artery disease) 04/23/2021   Frequent nosebleeds 04/23/2021   HOH (hard of hearing)    HTN (hypertension) 09/16/2016   Lumbar spondylosis 11/05/2014   GERD (gastroesophageal reflux disease) 01/19/2012   Mixed hyperlipidemia 04/15/2008   AAA (abdominal aortic aneurysm)  04/15/2008   3:57 PM, 06/25/21 Josue Hector PT DPT  Physical Therapist with [facility]   [phone] [place], [place], [address] Phone: [phone]   Fax:  [phone]  Name: [name] [name] MRN: [id number] Date of Birth: 01-16-1941

## 2021-06-30 ENCOUNTER — Other Ambulatory Visit: Payer: Self-pay | Admitting: *Deleted

## 2021-06-30 ENCOUNTER — Encounter (HOSPITAL_COMMUNITY): Payer: Medicare Other | Admitting: Physical Therapy

## 2021-06-30 ENCOUNTER — Telehealth (HOSPITAL_COMMUNITY): Payer: Self-pay | Admitting: Physical Therapy

## 2021-06-30 DIAGNOSIS — I714 Abdominal aortic aneurysm, without rupture, unspecified: Secondary | ICD-10-CM

## 2021-06-30 NOTE — Telephone Encounter (Signed)
Called patient about missed visit. Patient thought his appt today was at 11:45 instead of 11:15. He would like to cancel at this time and return at next scheduled visit 07/02/21.   11:41 AM, 06/30/21 Josue Hector PT DPT  Physical Therapist with [facility]  [phone]

## 2021-07-02 ENCOUNTER — Ambulatory Visit (HOSPITAL_COMMUNITY): Payer: Medicare Other | Admitting: Physical Therapy

## 2021-07-02 ENCOUNTER — Other Ambulatory Visit: Payer: Self-pay

## 2021-07-02 DIAGNOSIS — M6281 Muscle weakness (generalized): Secondary | ICD-10-CM | POA: Diagnosis not present

## 2021-07-02 DIAGNOSIS — M545 Low back pain, unspecified: Secondary | ICD-10-CM | POA: Diagnosis not present

## 2021-07-02 DIAGNOSIS — G8929 Other chronic pain: Secondary | ICD-10-CM | POA: Diagnosis not present

## 2021-07-02 DIAGNOSIS — M25512 Pain in left shoulder: Secondary | ICD-10-CM | POA: Diagnosis not present

## 2021-07-02 NOTE — Therapy (Signed)
[place], [place], [address] Phone: [phone]   Fax:  [phone]  Physical Therapy Treatment  Patient Details  Name: [name] [name] MRN: [id number] Date of Birth: [birth year] Referring Provider (PT): Consuella Lose MD   Encounter Date: 07/02/2021   PT End of Session - 07/02/21 1129     Visit Number 7    Number of Visits 12    Date for PT Re-Evaluation 07/09/21    Authorization Type Medicare A/ [employer] 2ndary    Progress Note Due on Visit 10    PT Start Time 1050    PT Stop Time 1129    PT Time Calculation (min) 39 min    Activity Tolerance Patient tolerated treatment well    Behavior During Therapy [facility] for tasks assessed/performed             Past Medical History:  Diagnosis Date   AAA (abdominal aortic aneurysm)    needs yearly ultrasound   Allergy    Anemia    Arthritis    Asthma    BCC (basal cell carcinoma) 08/18/1989   left shoulder blad, upper right arm, left arm beyond elbow, c&d   BCC (basal cell carcinoma) 01/31/1992   Posterior neck, curetx3, 62fu   BCC (basal cell carcinoma) 11/22/2001   mid forehead, cx3, excision, right forearm, cx3, 91fu   BCC (basal cell carcinoma) 10/09/2003   mid forehead, MOHs   BCC (basal cell carcinoma) 08/15/2008   upper left back, biopsy   BPH (benign prostatic hyperplasia)    CAD (coronary artery disease)    Cancer ([place])    skin cancer   COPD (chronic obstructive pulmonary disease) ([place])    Dysrhythmia    pt. states it can be fast at times   GERD (gastroesophageal reflux disease)    Glaucoma    HOH (hard of hearing)    Hypercholesterolemia    Hypertension    Impaired fasting glucose    Low back pain    Melanoma in situ ([place]) 10/09/2003   left chin, MOHs   MI (myocardial infarction) ([place]) 1999   SCC (squamous cell carcinoma) 07/03/2014   in situ, behind left ear, cx3, cautery, 23fu   SCC (squamous cell carcinoma) 07/03/2014   well diff, left  forearm, biopsy, cx1, cautery   SCC (squamous cell carcinoma) 07/20/2017   in situ, left upper arm, cx3, 77fu   SCC (squamous cell carcinoma) 01/10/2019   in situ, left post shoulder, cx3, 60fu   SCC (squamous cell carcinoma) 11/22/2001   left forearm distal, left forearm, cx3, 36fu   SCC (squamous cell carcinoma) 10/09/2003   [place], left ear post, clear per st, right cheek clear   SCC (squamous cell carcinoma) 03/30/2004   in situ, left upper arm, cx3, 96fu   SCC (squamous cell carcinoma) 03/08/2005   in situ, right cheek, mid upper forehead, cx3, 79fu   SCC (squamous cell carcinoma) 06/08/2006   in situ, left shoulder, cx3, 86fu   SCC (squamous cell carcinoma) 05/05/2010   right inner wrist, biopsy   SCC (squamous cell carcinoma) 09/13/2013   in situ, right crown scalp, front scalp, biopsy   Thrush     Past Surgical History:  Procedure Laterality Date   BACK SURGERY     x 3   CARDIAC CATHETERIZATION     angioplasty   CATARACT EXTRACTION W/PHACO  03/20/2012   Procedure: CATARACT EXTRACTION PHACO AND INTRAOCULAR LENS PLACEMENT ([place]);  Surgeon: Debe Coder  Iona Hansen, MD;  [facility]: [facility];  Service: Ophthalmology;  Laterality: Right;  CDE:  8.45   CATARACT EXTRACTION W/PHACO Left 04/02/2013   Procedure: CATARACT EXTRACTION PHACO AND INTRAOCULAR LENS PLACEMENT ([facility]);  Surgeon: Williams Che, MD;  Location: [facility];  Service: Ophthalmology;  Laterality: Left;  CDE:  6.50   CHOLECYSTECTOMY  2000   COLONOSCOPY  2009   repeat 5 years   ESOPHAGOGASTRODUODENOSCOPY     HERNIA REPAIR Left    inguinal   INGUINAL HERNIA REPAIR Right 03/28/2020   Procedure: Right Inguinal Herniorrhaphy with Mesh;  Surgeon: Aviva Signs, MD;  Location: [facility];  Service: General;  Laterality: Right;   LAPAROSCOPIC PARTIAL COLECTOMY N/A 06/11/2013   Procedure: LAPAROSCOPIC HAND ASSISTED PARTIAL COLECTOMY;  Surgeon: Jamesetta So, MD;  Location: [facility];  Service: General;  Laterality: N/A;   NASAL ENDOSCOPY WITH  EPISTAXIS CONTROL Bilateral 02/11/2020   Procedure: NASAL ENDOSCOPY WITH EPISTAXIS CONTROL;  Surgeon: Leta Baptist, MD;  Location: [place];  Service: ENT;  Laterality: Bilateral;   right eye detached retina Bilateral    SPINAL FUSION  4696   YAG LASER APPLICATION Left 29/52/8413   Procedure: YAG LASER APPLICATION;  Surgeon: Williams Che, MD;  Location: [facility];  Service: Ophthalmology;  Laterality: Left;    There were no vitals filed for this visit.   Subjective Assessment - 07/02/21 1052     Subjective Pt states that he is having some pain in his back; not sure why.    Pertinent History AAA, hernia repairs, 4 lumbar spine surgeries (laminectomy and fusions) 2016    Limitations Lifting;Standing;Walking;House hold activities    How long can you stand comfortably? up to an hour    Diagnostic tests MRI    Patient Stated Goals Get back to feeling comfortable with core muscle strength    Currently in Pain? Yes    Pain Score 2     Pain Location Back    Pain Descriptors / Indicators --   weak and stiff.   Pain Type Acute pain    Pain Onset In the past 7 days    Pain Frequency Intermittent                               [facility] Adult PT Treatment/Exercise - 07/02/21 0001       Exercises   Exercises Lumbar      Lumbar Exercises: Stretches   Active Hamstring Stretch Right;Left;2 reps;30 seconds    Single Knee to Chest Stretch Left;Right;2 reps;30 seconds    Lower Trunk Rotation 5 reps;10 seconds    Other Lumbar Stretch Exercise hip excursion x 10      Lumbar Exercises: Standing   Scapular Retraction Strengthening;Both;10 reps    Theraband Level (Scapular Retraction) Level 3 (Green)    Row Strengthening;Both;10 reps    Theraband Level (Row) Level 3 (Green)    Shoulder Extension Strengthening;10 reps    Shoulder ADduction Both;10 reps;Theraband    Theraband Level (Shoulder Adduction) Level 3 (Green)    Other Standing Lumbar Exercises paloff x 10     Other Standing Lumbar Exercises wall arch x `0      Lumbar Exercises: Supine   AB Set Limitations 5    Dead Bug 10 reps    Bridge with [employer] 10 reps                 Balance Exercises -  07/02/21 0001       Balance Exercises: Standing   Tandem Stance Eyes open;3 reps    Sidestepping 3 reps    Other Standing Exercises hip extension x 10                  PT Short Term Goals - 05/28/21 1111       PT SHORT TERM GOAL #1   Title Patient will be independent with initial HEP and self-management strategies to improve functional outcomes    Time 3    Period Weeks    Status New    Target Date 06/18/21               PT Long Term Goals - 05/28/21 1112       PT LONG TERM GOAL #1   Title Patient will be able to ambulate at least 350 feet during 2MWT with LRAD to demonstrate improved ability to perform functional mobility and associated tasks.    Time 6    Period Weeks    Status New    Target Date 07/09/21      PT LONG TERM GOAL #2   Title Patient will improve FOTO score by at least 5 points to indicate improvement in functional outcomes    Time 6    Period Weeks    Status New    Target Date 07/09/21      PT LONG TERM GOAL #3   Title Patient will have equal to or > 4/5 MMT throughout BLE (except LT ankle DF)  to improve ability to perform functional mobility, stair ambulation and ADLs.    Time 6    Period Weeks    Status New    Target Date 07/09/21      PT LONG TERM GOAL #4   Title Patient will be able to maintain tandem stance >30 seconds on BLEs to improve stability and reduce risk for falls    Time 6    Period Weeks    Status New    Target Date 07/09/21                   Plan - 07/02/21 1105     Clinical Impression Statement Added dead bug, postural  theraband, side stepping  as well as standing extension to program with good tolerance.  Due to COPD pt needs head raised for supine exercises.    Personal Factors and  Comorbidities Comorbidity 3+;Time since onset of injury/illness/exacerbation    Examination-Activity Limitations Lift;Stand;Locomotion Level;Transfers;Carry;Stairs    Examination-Participation Restrictions Laundry;Shop;Community Activity;Cleaning;Yard Work;Meal Prep    Stability/Clinical Decision Making Stable/Uncomplicated    Rehab Potential Good    PT Frequency 2x / week    PT Duration 6 weeks    PT Treatment/Interventions ADLs/Self Care Home Management;Biofeedback;Traction;Moist Heat;Stair training;Gait training;Neuromuscular re-education;Balance training;DME Instruction;Iontophoresis 4mg /ml Dexamethasone;Scar mobilization;Visual/perceptual remediation/compensation;Passive range of motion;Dry needling;Manual techniques;Functional mobility training;Ultrasound;Parrafin;Fluidtherapy;Cryotherapy;Electrical Stimulation;Contrast Bath;Therapeutic exercise;Splinting;Taping;Compression bandaging;Energy conservation;Patient/family education;Therapeutic activities;Orthotic Fit/Training;Vasopneumatic Device;Joint Manipulations;Spinal Manipulations    PT Next Visit Plan Progress core and LE strengthening as tolerated.    PT Home Exercise Plan Eval: ab brace, bridge 1/18 ab brace, sidelying hip abduction, seated TR 1/26 tandem stance, sidestepping, sit to stand 2/9 standing hip abduction    Consulted and Agree with Plan of Care Patient             Patient will benefit from skilled therapeutic intervention in order to improve the following deficits and impairments:  Pain, Improper body mechanics, Decreased mobility, Decreased activity tolerance,  Decreased range of motion, Decreased strength, Decreased endurance, Hypomobility, Decreased balance  Visit Diagnosis: Low back pain, unspecified back pain laterality, unspecified chronicity, unspecified whether sciatica present  Muscle weakness (generalized)     Problem List Patient Active Problem List   Diagnosis Date Noted   COPD (chronic obstructive  pulmonary disease) ([place]) 04/23/2021   BPH (benign prostatic hyperplasia) 04/23/2021   CAD (coronary artery disease) 04/23/2021   Frequent nosebleeds 04/23/2021   HOH (hard of hearing)    HTN (hypertension) 09/16/2016   Lumbar spondylosis 11/05/2014   GERD (gastroesophageal reflux disease) 01/19/2012   Mixed hyperlipidemia 04/15/2008   AAA (abdominal aortic aneurysm) 04/15/2008   Rayetta Humphrey, PT CLT [phone]  07/02/2021, 11:32 AM  [place] [address] [place], [place], [address] Phone: [phone]   Fax:  [phone]  Name: [name] [name] MRN: [id number] Date of Birth: 23-Feb-1941

## 2021-07-07 ENCOUNTER — Other Ambulatory Visit: Payer: Self-pay

## 2021-07-07 ENCOUNTER — Ambulatory Visit (HOSPITAL_COMMUNITY): Payer: Medicare Other | Admitting: Physical Therapy

## 2021-07-07 DIAGNOSIS — M25512 Pain in left shoulder: Secondary | ICD-10-CM

## 2021-07-07 DIAGNOSIS — M6281 Muscle weakness (generalized): Secondary | ICD-10-CM

## 2021-07-07 DIAGNOSIS — G8929 Other chronic pain: Secondary | ICD-10-CM

## 2021-07-07 DIAGNOSIS — M545 Low back pain, unspecified: Secondary | ICD-10-CM

## 2021-07-07 NOTE — Therapy (Signed)
[place], [place], [address] Phone: [phone]   Fax:  [phone]  Physical Therapy Treatment  Patient Details  Name: [name] [name] MRN: [id number] Date of Birth: 1941-02-15 Referring Provider (PT): Consuella Lose MD   Encounter Date: 07/07/2021   PT End of Session - 07/07/21 1319     Visit Number 8    Number of Visits 12    Date for PT Re-Evaluation 07/09/21    Authorization Type Medicare A/ AARP 2ndary    Progress Note Due on Visit 10    PT Start Time 1134    PT Stop Time 1206    PT Time Calculation (min) 32 min    Activity Tolerance Patient tolerated treatment well    Behavior During Therapy [facility] for tasks assessed/performed             Past Medical History:  Diagnosis Date   AAA (abdominal aortic aneurysm)    needs yearly ultrasound   Allergy    Anemia    Arthritis    Asthma    BCC (basal cell carcinoma) 08/18/1989   left shoulder blad, upper right arm, left arm beyond elbow, c&d   BCC (basal cell carcinoma) 01/31/1992   Posterior neck, curetx3, 53fu   BCC (basal cell carcinoma) 11/22/2001   mid forehead, cx3, excision, right forearm, cx3, 107fu   BCC (basal cell carcinoma) 10/09/2003   mid forehead, MOHs   BCC (basal cell carcinoma) 08/15/2008   upper left back, biopsy   BPH (benign prostatic hyperplasia)    CAD (coronary artery disease)    Cancer ([place])    skin cancer   COPD (chronic obstructive pulmonary disease) ([place])    Dysrhythmia    pt. states it can be fast at times   GERD (gastroesophageal reflux disease)    Glaucoma    HOH (hard of hearing)    Hypercholesterolemia    Hypertension    Impaired fasting glucose    Low back pain    Melanoma in situ ([place]) 10/09/2003   left chin, MOHs   MI (myocardial infarction) ([place]) 1999   SCC (squamous cell carcinoma) 07/03/2014   in situ, behind left ear, cx3, cautery, 65fu   SCC (squamous cell carcinoma) 07/03/2014   well diff, left  forearm, biopsy, cx1, cautery   SCC (squamous cell carcinoma) 07/20/2017   in situ, left upper arm, cx3, 55fu   SCC (squamous cell carcinoma) 01/10/2019   in situ, left post shoulder, cx3, 77fu   SCC (squamous cell carcinoma) 11/22/2001   left forearm distal, left forearm, cx3, 50fu   SCC (squamous cell carcinoma) 10/09/2003   [place], left ear post, clear per st, right cheek clear   SCC (squamous cell carcinoma) 03/30/2004   in situ, left upper arm, cx3, 69fu   SCC (squamous cell carcinoma) 03/08/2005   in situ, right cheek, mid upper forehead, cx3, 74fu   SCC (squamous cell carcinoma) 06/08/2006   in situ, left shoulder, cx3, 67fu   SCC (squamous cell carcinoma) 05/05/2010   right inner wrist, biopsy   SCC (squamous cell carcinoma) 09/13/2013   in situ, right crown scalp, front scalp, biopsy   Thrush     Past Surgical History:  Procedure Laterality Date   BACK SURGERY     x 3   CARDIAC CATHETERIZATION     angioplasty   CATARACT EXTRACTION W/PHACO  03/20/2012   Procedure: CATARACT EXTRACTION PHACO AND INTRAOCULAR LENS PLACEMENT ([place]);  Surgeon: Debe Coder  Iona Hansen, MD;  Location: [facility];  Service: Ophthalmology;  Laterality: Right;  CDE:  8.45   CATARACT EXTRACTION W/PHACO Left 04/02/2013   Procedure: CATARACT EXTRACTION PHACO AND INTRAOCULAR LENS PLACEMENT ([facility]);  Surgeon: Williams Che, MD;  Location: [facility];  Service: Ophthalmology;  Laterality: Left;  CDE:  6.50   CHOLECYSTECTOMY  2000   COLONOSCOPY  2009   repeat 5 years   ESOPHAGOGASTRODUODENOSCOPY     HERNIA REPAIR Left    inguinal   INGUINAL HERNIA REPAIR Right 03/28/2020   Procedure: Right Inguinal Herniorrhaphy with Mesh;  Surgeon: Aviva Signs, MD;  Location: [facility];  Service: General;  Laterality: Right;   LAPAROSCOPIC PARTIAL COLECTOMY N/A 06/11/2013   Procedure: LAPAROSCOPIC HAND ASSISTED PARTIAL COLECTOMY;  Surgeon: Jamesetta So, MD;  Location: [facility];  Service: General;  Laterality: N/A;   NASAL ENDOSCOPY WITH  EPISTAXIS CONTROL Bilateral 02/11/2020   Procedure: NASAL ENDOSCOPY WITH EPISTAXIS CONTROL;  Surgeon: Leta Baptist, MD;  Location: [place];  Service: ENT;  Laterality: Bilateral;   right eye detached retina Bilateral    SPINAL FUSION  8315   YAG LASER APPLICATION Left 17/61/6073   Procedure: YAG LASER APPLICATION;  Surgeon: Williams Che, MD;  Location: [facility];  Service: Ophthalmology;  Laterality: Left;    There were no vitals filed for this visit.   Subjective Assessment - 07/07/21 1136     Subjective pt states no pain just stiffness.    Currently in Pain? No/denies                               [facility] Adult PT Treatment/Exercise - 07/07/21 0001       Lumbar Exercises: Stretches   Other Lumbar Stretch Exercise hip excursion x 10      Lumbar Exercises: Standing   Forward Lunge 10 reps    Forward Lunge Limitations 2 sets onto 6" step no UE assist.    Scapular Retraction Strengthening;Both;20 reps    Theraband Level (Scapular Retraction) Level 3 (Green)    Scapular Retraction Limitations 2 sets of 10 reps    Row Strengthening;Both;20 reps    Theraband Level (Row) Level 3 (Green)    Row Limitations 2 sets of 10 reps    Shoulder Extension Strengthening;20 reps    Theraband Level (Shoulder Extension) Level 3 (Green)    Shoulder Extension Limitations 2 sets 10 reps    Other Standing Lumbar Exercises paloff GTB 2x10 NBOS    Other Standing Lumbar Exercises Arms up facing  wall, reach up toe raise 10X2                       PT Short Term Goals - 05/28/21 1111       PT SHORT TERM GOAL #1   Title Patient will be independent with initial HEP and self-management strategies to improve functional outcomes    Time 3    Period Weeks    Status New    Target Date 06/18/21               PT Long Term Goals - 05/28/21 1112       PT LONG TERM GOAL #1   Title Patient will be able to ambulate at least 350 feet during 2MWT with LRAD  to demonstrate improved ability to perform functional mobility and associated tasks.    Time 6    Period Weeks  Status New    Target Date 07/09/21      PT LONG TERM GOAL #2   Title Patient will improve FOTO score by at least 5 points to indicate improvement in functional outcomes    Time 6    Period Weeks    Status New    Target Date 07/09/21      PT LONG TERM GOAL #3   Title Patient will have equal to or > 4/5 MMT throughout BLE (except LT ankle DF)  to improve ability to perform functional mobility, stair ambulation and ADLs.    Time 6    Period Weeks    Status New    Target Date 07/09/21      PT LONG TERM GOAL #4   Title Patient will be able to maintain tandem stance >30 seconds on BLEs to improve stability and reduce risk for falls    Time 6    Period Weeks    Status New    Target Date 07/09/21                   Plan - 07/07/21 1351     Clinical Impression Statement Patient reports his balance is most challenging for him.  Continued to progress LE strength with added balance challenges as well.  Pt required intermittent HHA with activities. Patient with good tolerance overall with short seated rest breaks needed due to fatigue/shortness of breath.   Patient will continue to benefit from skilled therapy services to reduce deficits and improve functional ability.    Personal Factors and Comorbidities Comorbidity 3+;Time since onset of injury/illness/exacerbation    Examination-Activity Limitations Lift;Stand;Locomotion Level;Transfers;Carry;Stairs    Examination-Participation Restrictions Laundry;Shop;Community Activity;Cleaning;Yard Work;Meal Prep    Stability/Clinical Decision Making Stable/Uncomplicated    Rehab Potential Good    PT Frequency 2x / week    PT Duration 6 weeks    PT Treatment/Interventions ADLs/Self Care Home Management;Biofeedback;Traction;Moist Heat;Stair training;Gait training;Neuromuscular re-education;Balance training;DME  Instruction;Iontophoresis 4mg /ml Dexamethasone;Scar mobilization;Visual/perceptual remediation/compensation;Passive range of motion;Dry needling;Manual techniques;Functional mobility training;Ultrasound;Parrafin;Fluidtherapy;Cryotherapy;Electrical Stimulation;Contrast Bath;Therapeutic exercise;Splinting;Taping;Compression bandaging;Energy conservation;Patient/family education;Therapeutic activities;Orthotic Fit/Training;Vasopneumatic Device;Joint Manipulations;Spinal Manipulations    PT Next Visit Plan Progress core and LE strengthening as tolerated.  Add tandem stance and vectors next session    PT Home Exercise Plan Eval: ab brace, bridge 1/18 ab brace, sidelying hip abduction, seated TR 1/26 tandem stance, sidestepping, sit to stand 2/9 standing hip abduction    Consulted and Agree with Plan of Care Patient             Patient will benefit from skilled therapeutic intervention in order to improve the following deficits and impairments:  Pain, Improper body mechanics, Decreased mobility, Decreased activity tolerance, Decreased range of motion, Decreased strength, Decreased endurance, Hypomobility, Decreased balance  Visit Diagnosis: Low back pain, unspecified back pain laterality, unspecified chronicity, unspecified whether sciatica present  Muscle weakness (generalized)  Chronic left shoulder pain     Problem List Patient Active Problem List   Diagnosis Date Noted   COPD (chronic obstructive pulmonary disease) ([place]) 04/23/2021   BPH (benign prostatic hyperplasia) 04/23/2021   CAD (coronary artery disease) 04/23/2021   Frequent nosebleeds 04/23/2021   HOH (hard of hearing)    HTN (hypertension) 09/16/2016   Lumbar spondylosis 11/05/2014   GERD (gastroesophageal reflux disease) 01/19/2012   Mixed hyperlipidemia 04/15/2008   AAA (abdominal aortic aneurysm) 04/15/2008   Teena Irani, PTA/CLT, WTA [phone]  Teena Irani, PTA 07/07/2021, 1:52 PM  [place]  [address] [place], [place], [address] Phone: [phone]   Fax:  [phone]  Name: [name] [name] MRN: [id number] Date of Birth: [birth year]

## 2021-07-09 ENCOUNTER — Other Ambulatory Visit: Payer: Self-pay

## 2021-07-09 ENCOUNTER — Ambulatory Visit (HOSPITAL_COMMUNITY): Payer: Medicare Other | Admitting: Physical Therapy

## 2021-07-09 ENCOUNTER — Telehealth: Payer: Self-pay | Admitting: Family Medicine

## 2021-07-09 DIAGNOSIS — M25512 Pain in left shoulder: Secondary | ICD-10-CM | POA: Diagnosis not present

## 2021-07-09 DIAGNOSIS — M545 Low back pain, unspecified: Secondary | ICD-10-CM | POA: Diagnosis not present

## 2021-07-09 DIAGNOSIS — M6281 Muscle weakness (generalized): Secondary | ICD-10-CM

## 2021-07-09 DIAGNOSIS — E785 Hyperlipidemia, unspecified: Secondary | ICD-10-CM

## 2021-07-09 DIAGNOSIS — G8929 Other chronic pain: Secondary | ICD-10-CM | POA: Diagnosis not present

## 2021-07-09 MED ORDER — LOVASTATIN 40 MG PO TABS: 40.0000 mg | ORAL_TABLET | Freq: Every day | ORAL | 0 refills | Status: DC

## 2021-07-09 NOTE — Therapy (Signed)
[place], [place], [address] Phone: [phone]   Fax:  [phone]  Physical Therapy Treatment  Patient Details  Name: [name] [name] MRN: [id number] Date of Birth: 1941-03-23 Referring Provider (PT): Consuella Lose MD  PHYSICAL THERAPY DISCHARGE SUMMARY  Visits from Start of Care: 9  Current functional level related to goals / functional outcomes: 9   Remaining deficits: See below    Education / Equipment: The importance of walking and stretching; possible balance therapy   Patient agrees to discharge. Patient goals were partially met. Patient is being discharged due to being pleased with the current functional level.  Encounter Date: 07/09/2021   PT End of Session - 07/09/21 1057     Visit Number 9    Number of Visits 9    Date for PT Re-Evaluation 07/09/21    Authorization Type Medicare A/ [employer] 2ndary    Progress Note Due on Visit 9    PT Start Time 1050    PT Stop Time 1128    PT Time Calculation (min) 38 min    Activity Tolerance Patient tolerated treatment well    Behavior During Therapy WFL for tasks assessed/performed             Past Medical History:  Diagnosis Date   AAA (abdominal aortic aneurysm)    needs yearly ultrasound   Allergy    Anemia    Arthritis    Asthma    BCC (basal cell carcinoma) 08/18/1989   left shoulder blad, upper right arm, left arm beyond elbow, c&d   BCC (basal cell carcinoma) 01/31/1992   Posterior neck, curetx3, 82f   BCC (basal cell carcinoma) 11/22/2001   mid forehead, cx3, excision, right forearm, cx3, 561f  BCC (basal cell carcinoma) 10/09/2003   mid forehead, MOHs   BCC (basal cell carcinoma) 08/15/2008   upper left back, biopsy   BPH (benign prostatic hyperplasia)    CAD (coronary artery disease)    Cancer (HCC)    skin cancer   COPD (chronic obstructive pulmonary disease) ([facility]   Dysrhythmia    pt. states it can be fast at times    GERD (gastroesophageal reflux disease)    Glaucoma    HOH (hard of hearing)    Hypercholesterolemia    Hypertension    Impaired fasting glucose    Low back pain    [place] in situ ([facility]2005   left chin, MOHs   MI [place]ocardial infarction) ([facility]   SCC (squamous cell carcinoma) 07/03/2014   in situ, behind left ear, cx3, cautery, 70f58f SCC (squamous cell carcinoma) 07/03/2014   well diff, left forearm, biopsy, cx1, cautery   SCC (squamous cell carcinoma) 07/20/2017   in situ, left upper arm, cx3, 70fu43fSCC (squamous cell carcinoma) 01/10/2019   in situ, left post shoulder, cx3, 70fu 97fCC (squamous cell carcinoma) 11/22/2001   left forearm distal, left forearm, cx3, 70fu  48fC (squamous cell carcinoma) 10/09/2003   [place], left ear post, clear per st, right cheek clear   SCC (squamous cell carcinoma) 03/30/2004   in situ, left upper arm, cx3, 70fu   17f (squamous cell carcinoma) 03/08/2005   in situ, right cheek, mid upper forehead, cx3, 70fu   S52f(squamous cell carcinoma) 06/08/2006   in situ, left shoulder, cx3, 70fu   SC37fsquamous cell carcinoma) 05/05/2010   right inner wrist, biopsy   SCC (squamous cell  carcinoma) 09/13/2013   in situ, right crown scalp, front scalp, biopsy   Thrush     Past Surgical History:  Procedure Laterality Date   BACK SURGERY     x 3   CARDIAC CATHETERIZATION     angioplasty   CATARACT EXTRACTION W/PHACO  03/20/2012   Procedure: CATARACT EXTRACTION PHACO AND INTRAOCULAR LENS PLACEMENT ([place]);  Surgeon: Williams Che, MD;  Location: [facility];  Service: Ophthalmology;  Laterality: Right;  CDE:  8.45   CATARACT EXTRACTION W/PHACO Left 04/02/2013   Procedure: CATARACT EXTRACTION PHACO AND INTRAOCULAR LENS PLACEMENT ([facility]);  Surgeon: Williams Che, MD;  Location: [facility];  Service: Ophthalmology;  Laterality: Left;  CDE:  6.50   CHOLECYSTECTOMY  2000   COLONOSCOPY  2009   repeat 5 years   ESOPHAGOGASTRODUODENOSCOPY     HERNIA REPAIR Left     inguinal   INGUINAL HERNIA REPAIR Right 03/28/2020   Procedure: Right Inguinal Herniorrhaphy with Mesh;  Surgeon: Aviva Signs, MD;  Location: [facility];  Service: General;  Laterality: Right;   LAPAROSCOPIC PARTIAL COLECTOMY N/A 06/11/2013   Procedure: LAPAROSCOPIC HAND ASSISTED PARTIAL COLECTOMY;  Surgeon: Jamesetta So, MD;  Location: [facility];  Service: General;  Laterality: N/A;   NASAL ENDOSCOPY WITH EPISTAXIS CONTROL Bilateral 02/11/2020   Procedure: NASAL ENDOSCOPY WITH EPISTAXIS CONTROL;  Surgeon: Leta Baptist, MD;  Location: [facility];  Service: ENT;  Laterality: Bilateral;   right eye detached retina Bilateral    SPINAL FUSION  3825   YAG LASER APPLICATION Left 05/39/7673   Procedure: YAG LASER APPLICATION;  Surgeon: Williams Che, MD;  Location: [facility];  Service: Ophthalmology;  Laterality: Left;    There were no vitals filed for this visit.   Subjective Assessment - 07/09/21 1126     Subjective Pt states that he is at least 70% better and feels he could be even better but he does not do his exercises on a regular basis.    Pertinent History AAA, hernia repairs, 4 lumbar spine surgeries (laminectomy and fusions) 2016    How long can you stand comfortably? up to an hour    Diagnostic tests MRI    Patient Stated Goals Get back to feeling comfortable with core muscle strength    Currently in Pain? No/denies                [facility] PT Assessment - 07/09/21 0001       Assessment   Medical Diagnosis LBP/ LE weakness    Referring Provider (PT) Consuella Lose MD    Onset Date/Surgical Date --   Chronic   Prior Therapy Yes      Precautions   Precautions Fall      Restrictions   Weight Bearing Restrictions No      [place] residence      Prior Function   Level of Independence Independent      Cognition   Overall Cognitive Status Within Functional Limits for tasks assessed      Observation/Other Assessments    Focus on Therapeutic Outcomes (FOTO)  61%, now 66%  function      AROM   Lumbar Flexion 20% limited was 50% limited    Lumbar Extension 30% limited was 50% limited    Lumbar - Right Side Bend 10% limited was 50% limited    Lumbar - Left Side Bend no limitation was 50% limited      Strength  Right Hip Flexion 4+/5   was 4+   Right Hip Extension 4/5   was 3-   Right Hip ABduction 4+/5    Left Hip Flexion 4+/5    Left Hip Extension 4/5   was 3-   Left Hip ABduction 5/5   was 3+   Right Knee Extension 5/5    Left Knee Extension 5/5   was 4+   Right Ankle Dorsiflexion 4/5    Left Ankle Dorsiflexion 3/5      Palpation   Palpation comment No noted TTP in lumbar      Transfers   Five time sit to stand comments  10.8 sec with no UEs      Balance   Balance Assessed Yes      Static Standing Balance   Static Standing Balance -  Activities  Tandam Stance - Right Leg;Tandam Stance - Left Leg                                    PT Education - 07/09/21 1125     Education Details explained the importance of walking for not only back but general health.  Added ankle dorsiflexion with theraband to pt HEP, explained to pt that he might do well if he came back for balance.    Person(s) Educated Patient    Methods Explanation;Demonstration              PT Short Term Goals - 07/09/21 1058       PT SHORT TERM GOAL #1   Title Patient will be independent with initial HEP and self-management strategies to improve functional outcomes    Time 3    Period Weeks    Status Achieved    Target Date 06/18/21               PT Long Term Goals - 07/09/21 1058       PT LONG TERM GOAL #1   Title Patient will be able to ambulate at least 350 feet during 2MWT with LRAD to demonstrate improved ability to perform functional mobility and associated tasks.    Baseline now 464 ft in 2 Minutes    Time 6    Period Weeks    Status Achieved    Target Date 07/09/21       PT LONG TERM GOAL #2   Title Patient will improve FOTO score by at least 5 points to indicate improvement in functional outcomes    Time 6    Period Weeks    Status Achieved    Target Date 07/09/21      PT LONG TERM GOAL #3   Title Patient will have equal to or > 4/5 MMT throughout BLE (except LT ankle DF)  to improve ability to perform functional mobility, stair ambulation and ADLs.    Baseline all but ankle    Time 6    Period Weeks    Status Partially Met    Target Date 07/09/21      PT LONG TERM GOAL #4   Title Patient will be able to maintain tandem stance >30 seconds on BLEs to improve stability and reduce risk for falls    Time 6    Period Weeks    Status Not Met    Target Date 07/09/21                   Plan -  07/09/21 1128     Clinical Impression Statement Mr. Riviello was reassessed he has done very well with therapy and feels that he can continue on his own at this time but is very interested in coming back for balance rehab.  Please see reassessment for progress.    Personal Factors and Comorbidities Comorbidity 3+;Time since onset of injury/illness/exacerbation    Examination-Activity Limitations Lift;Stand;Locomotion Level;Transfers;Carry;Stairs    Examination-Participation Restrictions Laundry;Shop;Community Activity;Cleaning;Yard Work;Meal Prep    Stability/Clinical Decision Making Stable/Uncomplicated    Rehab Potential Good    PT Frequency 2x / week    PT Duration 6 weeks    PT Treatment/Interventions ADLs/Self Care Home Management;Biofeedback;Traction;Moist Heat;Stair training;Gait training;Neuromuscular re-education;Balance training;DME Instruction;Iontophoresis 52m/ml Dexamethasone;Scar mobilization;Visual/perceptual remediation/compensation;Passive range of motion;Dry needling;Manual techniques;Functional mobility training;Ultrasound;Parrafin;Fluidtherapy;Cryotherapy;Electrical Stimulation;Contrast Bath;Therapeutic exercise;Splinting;Taping;Compression  bandaging;Energy conservation;Patient/family education;Therapeutic activities;Orthotic Fit/Training;Vasopneumatic Device;Joint Manipulations;Spinal Manipulations    PT Next Visit Plan Discharge pt from back therapy.  Pt may return for balance.    PT Home Exercise Plan Eval: ab brace, bridge 1/18 ab brace, sidelying hip abduction, seated TR 1/26 tandem stance, sidestepping, sit to stand 2/9 standing hip abduction; 2/23:  tband ankle dorsiflexion    Consulted and Agree with Plan of Care Patient             Patient will benefit from skilled therapeutic intervention in order to improve the following deficits and impairments:  Pain, Improper body mechanics, Decreased mobility, Decreased activity tolerance, Decreased range of motion, Decreased strength, Decreased endurance, Hypomobility, Decreased balance  Visit Diagnosis: Low back pain, unspecified back pain laterality, unspecified chronicity, unspecified whether sciatica present  Muscle weakness (generalized)     Problem List Patient Active Problem List   Diagnosis Date Noted   COPD (chronic obstructive pulmonary disease) ([place] 04/23/2021   BPH (benign prostatic hyperplasia) 04/23/2021   CAD (coronary artery disease) 04/23/2021   Frequent nosebleeds 04/23/2021   HOH (hard of hearing)    HTN (hypertension) 09/16/2016   Lumbar spondylosis 11/05/2014   GERD (gastroesophageal reflux disease) 01/19/2012   Mixed hyperlipidemia 04/15/2008   AAA (abdominal aortic aneurysm) 04/15/2008   CRayetta Humphrey PT CLT [phone] 07/09/2021, 11:32 AM  [address] [address]Phone: [phone]  Fax:  [phone] Name: [name] LEFEBERMRN: [id number] of Birth: 4January 31, 1942

## 2021-07-09 NOTE — Telephone Encounter (Signed)
Prescription sent electronically to pharmacy  Left message to return call 

## 2021-07-09 NOTE — Telephone Encounter (Signed)
Patient is requesting refill on lovastatin 40 mg called into [facility]

## 2021-07-10 NOTE — Telephone Encounter (Signed)
Patient notified 07/09/21

## 2021-07-13 ENCOUNTER — Other Ambulatory Visit: Payer: Self-pay

## 2021-07-13 ENCOUNTER — Ambulatory Visit (INDEPENDENT_AMBULATORY_CARE_PROVIDER_SITE_OTHER): Payer: Medicare Other | Admitting: Family Medicine

## 2021-07-13 VITALS — BP 144/73 | HR 92 | Temp 98.4°F | Ht 73.0 in | Wt 202.8 lb

## 2021-07-13 DIAGNOSIS — J45901 Unspecified asthma with (acute) exacerbation: Secondary | ICD-10-CM | POA: Insufficient documentation

## 2021-07-13 DIAGNOSIS — J441 Chronic obstructive pulmonary disease with (acute) exacerbation: Secondary | ICD-10-CM | POA: Diagnosis not present

## 2021-07-13 MED ORDER — PREDNISONE 50 MG PO TABS: ORAL_TABLET | ORAL | 0 refills | Status: DC

## 2021-07-13 MED ORDER — AZITHROMYCIN 250 MG PO TABS: ORAL_TABLET | ORAL | 0 refills | Status: DC

## 2021-07-13 NOTE — Progress Notes (Signed)
Subjective:  Patient ID: [name] [name], male    DOB: 21-Sep-1940  Age: 81 y.o. MRN: [id number]  CC: Chief Complaint  Patient presents with   Cough    Dry, hacky cough.  Has tested twice for COVID both were negative.   Chills   Nasal Congestion    HPI:  81 year old male with COPD presents for evaluation of the above.  Patient reports 1 week history of symptoms. Reports cough (productive), chills and congestion. No documented fever.  Has used albuterol, mucinex and alka seltzer without improvement. COVID testing negative.   Patient Active Problem List   Diagnosis Date Noted   COPD (chronic obstructive pulmonary disease) ([place]) 04/23/2021   BPH (benign prostatic hyperplasia) 04/23/2021   CAD (coronary artery disease) 04/23/2021   Frequent nosebleeds 04/23/2021   HOH (hard of hearing)    HTN (hypertension) 09/16/2016   Lumbar spondylosis 11/05/2014   GERD (gastroesophageal reflux disease) 01/19/2012   Mixed hyperlipidemia 04/15/2008   AAA (abdominal aortic aneurysm) 04/15/2008    Social Hx   Social History   Socioeconomic History   Marital status: Married    Spouse name: Not on file   Number of children: Not on file   Years of education: Not on file   Highest education level: Not on file  Occupational History   Not on file  Tobacco Use   Smoking status: Former    Packs/day: 1.00    Years: 35.00    Pack years: 35.00    Types: Cigarettes    Quit date: 03/28/2003    Years since quitting: 18.3   Smokeless tobacco: Never  Vaping Use   Vaping Use: Never used  Substance and Sexual Activity   Alcohol use: No    Alcohol/week: 0.0 standard drinks   Drug use: No   Sexual activity: Not Currently    Birth control/protection: None  Other Topics Concern   Not on file  Social History Narrative   Not on file   Social Determinants of Health   Financial Resource Strain: Not on file  Food Insecurity: Not on file  Transportation Needs: Not on file  Physical Activity: Not  on file  Stress: Not on file  Social Connections: Not on file    Review of Systems Per HPI  Objective:  BP (!) 144/73    Pulse 92    Temp 98.4 F (36.9 C) (Oral)    Ht 6\' 1"  (1.854 m)    Wt 202 lb 12.8 oz (92 kg)    SpO2 95%    BMI 26.76 kg/m   BP/Weight 07/13/2021 06/24/2021 08/23/8117  Systolic BP 147 829 562  Diastolic BP 73 76 64  Wt. (Lbs) 202.8 201.8 202  BMI 26.76 26.62 26.65    Physical Exam Vitals and nursing note reviewed.  Constitutional:      General: He is not in acute distress.    Appearance: Normal appearance. He is not ill-appearing.  HENT:     Head: Normocephalic and atraumatic.  Eyes:     General:        Right eye: No discharge.        Left eye: No discharge.     Conjunctiva/sclera: Conjunctivae normal.  Cardiovascular:     Rate and Rhythm: Normal rate and regular rhythm.  Pulmonary:     Effort: Pulmonary effort is normal.     Breath sounds: Normal breath sounds. No wheezing, rhonchi or rales.  Neurological:     Mental Status: He is  alert.  Psychiatric:        Mood and Affect: Mood normal.        Behavior: Behavior normal.    Lab Results  Component Value Date   WBC 6.7 09/05/2020   HGB 13.4 09/05/2020   HCT 41.3 09/05/2020   PLT 214 09/05/2020   GLUCOSE 101 (H) 04/23/2021   CHOL 124 04/23/2021   TRIG 109 04/23/2021   HDL 39 (L) 04/23/2021   LDLCALC 65 04/23/2021   ALT 14 04/23/2021   AST 18 04/23/2021   NA 142 04/23/2021   K 4.7 04/23/2021   CL 102 04/23/2021   CREATININE 0.86 05/21/2021   BUN 12 05/21/2021   CO2 26 04/23/2021   TSH 1.230 02/14/2019   PSA 1.80 07/26/2014   HGBA1C 6.1 (H) 04/23/2021     Assessment & Plan:   Problem List Items Addressed This Visit   None Visit Diagnoses     COPD exacerbation ([place])    -  Primary   Relevant Medications   azithromycin (ZITHROMAX) 250 MG tablet   predniSONE (DELTASONE) 50 MG tablet       Meds ordered this encounter  Medications   azithromycin (ZITHROMAX) 250 MG tablet     Sig: 2 tablets on day 1, then 1 tablet daily on days 2-5.    Dispense:  6 tablet    Refill:  0   predniSONE (DELTASONE) 50 MG tablet    Sig: 1 tablet daily x 5 days    Dispense:  5 tablet    Refill:  [place]

## 2021-07-13 NOTE — Patient Instructions (Addendum)
Medication as prescribed. ° °Call with concerns. ° °Take care ° °Dr Samay Delcarlo °

## 2021-07-13 NOTE — Assessment & Plan Note (Signed)
COPD with acute exacerbation.  Treating with prednisone and azithromycin.

## 2021-07-14 ENCOUNTER — Telehealth: Payer: Self-pay | Admitting: Family Medicine

## 2021-07-14 ENCOUNTER — Other Ambulatory Visit: Payer: Self-pay | Admitting: Family Medicine

## 2021-07-14 MED ORDER — BENZONATATE 200 MG PO CAPS: 200.0000 mg | ORAL_CAPSULE | Freq: Three times a day (TID) | ORAL | 0 refills | Status: DC | PRN

## 2021-07-14 NOTE — Telephone Encounter (Signed)
Pt contacted and verbalized understanding.  

## 2021-07-14 NOTE — Telephone Encounter (Signed)
Please advise. Thank you

## 2021-07-14 NOTE — Telephone Encounter (Signed)
Patient was seen yesterday and still has bad cough. He states up all night with cough and would like something called in nothing over the counter helped. [employer]

## 2021-07-21 ENCOUNTER — Other Ambulatory Visit: Payer: Self-pay | Admitting: Family Medicine

## 2021-07-21 DIAGNOSIS — I10 Essential (primary) hypertension: Secondary | ICD-10-CM

## 2021-07-22 DIAGNOSIS — H02132 Senile ectropion of right lower eyelid: Secondary | ICD-10-CM | POA: Diagnosis not present

## 2021-07-23 ENCOUNTER — Ambulatory Visit (INDEPENDENT_AMBULATORY_CARE_PROVIDER_SITE_OTHER): Payer: Medicare Other | Admitting: Family Medicine

## 2021-07-23 ENCOUNTER — Other Ambulatory Visit: Payer: Self-pay

## 2021-07-23 VITALS — BP 132/86 | HR 88 | Temp 98.4°F | Ht 73.0 in | Wt 203.8 lb

## 2021-07-23 DIAGNOSIS — R300 Dysuria: Secondary | ICD-10-CM | POA: Diagnosis not present

## 2021-07-23 DIAGNOSIS — N410 Acute prostatitis: Secondary | ICD-10-CM | POA: Diagnosis not present

## 2021-07-23 LAB — POCT URINALYSIS DIPSTICK
Spec Grav, UA: 1.015 (ref 1.010–1.025)
pH, UA: 6 (ref 5.0–8.0)

## 2021-07-23 MED ORDER — SULFAMETHOXAZOLE-TRIMETHOPRIM 800-160 MG PO TABS: 1.0000 | ORAL_TABLET | Freq: Two times a day (BID) | ORAL | 0 refills | Status: AC

## 2021-07-23 NOTE — Patient Instructions (Addendum)
Antibiotic as prescribed to cover for prostatitis. ? ?Call with concerns/worsening etc. ? ?Take care ? ?Dr. Lacinda Axon  ? ?

## 2021-07-24 DIAGNOSIS — N410 Acute prostatitis: Secondary | ICD-10-CM | POA: Insufficient documentation

## 2021-07-24 NOTE — Assessment & Plan Note (Signed)
Urinalysis clear today.  Suspected early prostatitis.  Treating with Bactrim.  Awaiting urine culture. ?

## 2021-07-24 NOTE — Progress Notes (Signed)
? ?Subjective:  ?Patient ID: [name] [name], male    DOB: Sep 21, 1940  Age: 81 y.o. MRN: [id number] ? ?CC: ?Chief Complaint  ?Patient presents with  ? Dysuria  ?  Patient states the burning with urination started yesterday and he has had frequency for the past few days. Patient states he was started on Zpack last week for respiratory illness  ? ? ?HPI: ? ?81 year old male presents for evaluation of the above. ? ?Patient reports urinary symptoms for the past few days.  Has recently had chills as well.  No documented fever.  He reports urinary frequency.  He reports some mild dysuria.  Recently on antibiotics for respiratory infection/COPD exacerbation.  Reports a prior history of prostatitis.  Patient reports that he follows with urology.  He is currently on Uroxatrol. ? ?Patient Active Problem List  ? Diagnosis Date Noted  ? Acute prostatitis 07/24/2021  ? COPD (chronic obstructive pulmonary disease) ([place]) 04/23/2021  ? BPH (benign prostatic hyperplasia) 04/23/2021  ? CAD (coronary artery disease) 04/23/2021  ? Frequent nosebleeds 04/23/2021  ? HTN (hypertension) 09/16/2016  ? Lumbar spondylosis 11/05/2014  ? GERD (gastroesophageal reflux disease) 01/19/2012  ? Mixed hyperlipidemia 04/15/2008  ? AAA (abdominal aortic aneurysm) 04/15/2008  ? ? ?Social Hx   ?Social History  ? ?Socioeconomic History  ? Marital status: Married  ?  Spouse name: Not on file  ? Number of children: Not on file  ? Years of education: Not on file  ? Highest education level: Not on file  ?Occupational History  ? Not on file  ?Tobacco Use  ? Smoking status: Former  ?  Packs/day: 1.00  ?  Years: 35.00  ?  Pack years: 35.00  ?  Types: Cigarettes  ?  Quit date: 03/28/2003  ?  Years since quitting: 18.3  ? Smokeless tobacco: Never  ?Vaping Use  ? Vaping Use: Never used  ?Substance and Sexual Activity  ? Alcohol use: No  ?  Alcohol/week: 0.0 standard drinks  ? Drug use: No  ? Sexual activity: Not Currently  ?  Birth control/protection: None  ?Other  Topics Concern  ? Not on file  ?Social History Narrative  ? Not on file  ? ?Social Determinants of Health  ? ?Financial Resource Strain: Not on file  ?Food Insecurity: Not on file  ?Transportation Needs: Not on file  ?Physical Activity: Not on file  ?Stress: Not on file  ?Social Connections: Not on file  ? ? ?Review of Systems ?Per HPI ? ?Objective:  ?BP 132/86   Pulse 88   Temp 98.4 ?F (36.9 ?C) (Oral)   Ht '6\' 1"'$  (1.854 m)   Wt 203 lb 12.8 oz (92.4 kg)   BMI 26.89 kg/m?  ? ?BP/Weight 07/23/2021 07/13/2021 06/24/2021  ?Systolic BP 478 295 621  ?Diastolic BP 86 73 76  ?Wt. (Lbs) 203.8 202.8 201.8  ?BMI 26.89 26.76 26.62  ? ? ?Physical Exam ?Vitals and nursing note reviewed.  ?Constitutional:   ?   General: He is not in acute distress. ?   Appearance: Normal appearance. He is not ill-appearing.  ?HENT:  ?   Head: Normocephalic and atraumatic.  ?Cardiovascular:  ?   Rate and Rhythm: Normal rate and regular rhythm.  ?Pulmonary:  ?   Effort: Pulmonary effort is normal.  ?   Breath sounds: Normal breath sounds. No wheezing or rales.  ?Abdominal:  ?   General: There is no distension.  ?   Palpations: Abdomen is soft.  ?  Tenderness: There is no abdominal tenderness.  ?Neurological:  ?   Mental Status: He is alert.  ?Psychiatric:     ?   Mood and Affect: Mood normal.     ?   Behavior: Behavior normal.  ? ? ?Lab Results  ?Component Value Date  ? WBC 6.7 09/05/2020  ? HGB 13.4 09/05/2020  ? HCT 41.3 09/05/2020  ? PLT 214 09/05/2020  ? GLUCOSE 101 (H) 04/23/2021  ? CHOL 124 04/23/2021  ? TRIG 109 04/23/2021  ? HDL 39 (L) 04/23/2021  ? LDLCALC 65 04/23/2021  ? ALT 14 04/23/2021  ? AST 18 04/23/2021  ? NA 142 04/23/2021  ? K 4.7 04/23/2021  ? CL 102 04/23/2021  ? CREATININE 0.86 05/21/2021  ? BUN 12 05/21/2021  ? CO2 26 04/23/2021  ? TSH 1.230 02/14/2019  ? PSA 1.80 07/26/2014  ? HGBA1C 6.1 (H) 04/23/2021  ? ? ? ?Assessment & Plan:  ? ?Problem List Items Addressed This Visit   ? ?  ? Genitourinary  ? Acute prostatitis - Primary   ?  Urinalysis clear today.  Suspected early prostatitis.  Treating with Bactrim.  Awaiting urine culture. ?  ?  ? ?Other Visit Diagnoses   ? ? Dysuria      ? Relevant Orders  ? POCT urinalysis dipstick (Completed)  ? Urine Culture  ? ?  ? ? ?Meds ordered this encounter  ?Medications  ? sulfamethoxazole-trimethoprim (BACTRIM DS) 800-160 MG tablet  ?  Sig: Take 1 tablet by mouth 2 (two) times daily for 14 days.  ?  Dispense:  28 tablet  ?  Refill:  0  ? ? ?Thersa Salt DO ?[place] ? ?

## 2021-07-25 LAB — URINE CULTURE

## 2021-07-25 LAB — SPECIMEN STATUS REPORT

## 2021-08-06 ENCOUNTER — Other Ambulatory Visit: Payer: Self-pay

## 2021-08-06 ENCOUNTER — Encounter: Payer: Self-pay | Admitting: Nurse Practitioner

## 2021-08-06 ENCOUNTER — Ambulatory Visit (INDEPENDENT_AMBULATORY_CARE_PROVIDER_SITE_OTHER): Payer: Medicare Other | Admitting: Nurse Practitioner

## 2021-08-06 VITALS — BP 108/62 | HR 75 | Temp 97.4°F | Ht 73.0 in | Wt 203.4 lb

## 2021-08-06 DIAGNOSIS — J45901 Unspecified asthma with (acute) exacerbation: Secondary | ICD-10-CM | POA: Diagnosis not present

## 2021-08-06 DIAGNOSIS — D721 Eosinophilia, unspecified: Secondary | ICD-10-CM | POA: Diagnosis not present

## 2021-08-06 DIAGNOSIS — Z720 Tobacco use: Secondary | ICD-10-CM | POA: Insufficient documentation

## 2021-08-06 DIAGNOSIS — J441 Chronic obstructive pulmonary disease with (acute) exacerbation: Secondary | ICD-10-CM | POA: Diagnosis not present

## 2021-08-06 DIAGNOSIS — K219 Gastro-esophageal reflux disease without esophagitis: Secondary | ICD-10-CM | POA: Diagnosis not present

## 2021-08-06 DIAGNOSIS — N410 Acute prostatitis: Secondary | ICD-10-CM

## 2021-08-06 DIAGNOSIS — J302 Other seasonal allergic rhinitis: Secondary | ICD-10-CM

## 2021-08-06 LAB — CBC WITH DIFFERENTIAL/PLATELET
Basophils Absolute: 0.1 10*3/uL (ref 0.0–0.1)
Basophils Relative: 0.7 % (ref 0.0–3.0)
Eosinophils Absolute: 0.3 10*3/uL (ref 0.0–0.7)
Eosinophils Relative: 3.4 % (ref 0.0–5.0)
HCT: 39.6 % (ref 39.0–52.0)
Hemoglobin: 12.9 g/dL — ABNORMAL LOW (ref 13.0–17.0)
Lymphocytes Relative: 17.7 % (ref 12.0–46.0)
Lymphs Abs: 1.5 10*3/uL (ref 0.7–4.0)
MCHC: 32.5 g/dL (ref 30.0–36.0)
MCV: 85.2 fl (ref 78.0–100.0)
Monocytes Absolute: 0.8 10*3/uL (ref 0.1–1.0)
Monocytes Relative: 9.2 % (ref 3.0–12.0)
Neutro Abs: 5.7 10*3/uL (ref 1.4–7.7)
Neutrophils Relative %: 69 % (ref 43.0–77.0)
Platelets: 265 10*3/uL (ref 150.0–400.0)
RBC: 4.65 Mil/uL (ref 4.22–5.81)
RDW: 15 % (ref 11.5–15.5)
WBC: 8.3 10*3/uL (ref 4.0–10.5)

## 2021-08-06 LAB — POCT EXHALED NITRIC OXIDE: FeNO level (ppb): 31

## 2021-08-06 MED ORDER — ADVAIR HFA 230-21 MCG/ACT IN AERO: 2.0000 | INHALATION_SPRAY | Freq: Two times a day (BID) | RESPIRATORY_TRACT | 5 refills | Status: DC

## 2021-08-06 MED ORDER — PREDNISONE 10 MG PO TABS: ORAL_TABLET | ORAL | 0 refills | Status: DC

## 2021-08-06 MED ORDER — MONTELUKAST SODIUM 10 MG PO TABS: 10.0000 mg | ORAL_TABLET | Freq: Every day | ORAL | 5 refills | Status: DC

## 2021-08-06 NOTE — Assessment & Plan Note (Addendum)
Worsening SOB over the past 6-7 weeks. Slight improvement with previous prednisone burst end of February but then symptoms returned. No desaturations on walking oximetry today. Has allergy type symptoms, midflow reversibility on PFTs, and a hx of mild eosinophilia. FeNO checked today and was slightly elevated at 31 ppb. Step up to ICS/LABA therapy advised. Check CBC with diff, IgE, and A1AT today. Prednisone taper. Completed beginning of this month and now on Bactrim with significant improvement in cough; will hold off on imaging today. Advised if productive cough returns or symptoms worsen, to notify so we can obtain CXR.  ? ?Patient Instructions  ?Stop Flovent. Start Advair 230 mcg 2 puffs Twice daily. Brush tongue and rinse mouth well afterwards  ?Continue Albuterol inhaler 2 puffs or 3 mL neb every 6 hours as needed for shortness of breath or wheezing. Notify if symptoms persist despite rescue inhaler/neb use. ?Continue Zyrtec 10 mg daily for allergies  ?Continue Nexium 40 mg daily  ? ?Labs - CBC with diff, IgE, A1AT level  ? ?Prednisone taper. 4 tabs for 2 days, then 3 tabs for 2 days, 2 tabs for 2 days, then 1 tab for 2 days, then stop. Take in AM with food. ? ?Follow up in 2 weeks with Dr. Ander Slade or Alanson Aly. If symptoms do not improve or worsen, please contact office for sooner follow up or seek emergency care. ? ? ?

## 2021-08-06 NOTE — Patient Instructions (Addendum)
Stop Flovent. Start Advair 230 mcg 2 puffs Twice daily. Brush tongue and rinse mouth well afterwards  ?Continue Albuterol inhaler 2 puffs or 3 mL neb every 6 hours as needed for shortness of breath or wheezing. Notify if symptoms persist despite rescue inhaler/neb use. ?Continue Zyrtec 10 mg daily for allergies  ?Continue Nexium 40 mg daily  ? ?Labs - CBC with diff, IgE, A1AT level  ? ?Prednisone taper. 4 tabs for 2 days, then 3 tabs for 2 days, 2 tabs for 2 days, then 1 tab for 2 days, then stop. Take in AM with food. ? ?Follow up in 2 weeks with Dr. Ander Slade or Alanson Aly. If symptoms do not improve or worsen, please contact office for sooner follow up or seek emergency care. ?

## 2021-08-06 NOTE — Assessment & Plan Note (Signed)
35 pack year hx; quit almost 20 years ago. No hemoptysis, anorexia or weight loss. If respiratory symptoms persist, may consider CT chest imaging.  ?

## 2021-08-06 NOTE — Progress Notes (Signed)
? ?'@Patient'$  ID: [name] [name], male    DOB: 15-Mar-1941, 81 y.o.   MRN: [id number] ? ?Chief Complaint  ?Patient presents with  ? Follow-up  ?  SOB at rest and during exertion  ? ? ?Referring provider: ?Coral Spikes, DO ? ?HPI: ?81 year old male, former smoker (35 pack year hx) followed for COPD. He is a patient of Dr. Judson Roch and last seen in office 06/01/2021. Past medical history significant for AAA, HTN, CAD, GERD, BPH, HLD.  ? ?TEST/EVENTS:  ?11/05/2019 PFTs: FVC 2.82 (56), FEV1 1.72 (47), ratio 61, DLCO 114%. Severe obstructive airway disease. No significant BD; did have some midflow reversibility  ? ?06/01/2021: OV with Dr. Ander Slade. Tx for AECOPD with z pack and prednisone burst. Maintained on Flovent  ? ?07/13/2021: OV with PCP Dr. Lacinda Axon. Treated for AECOPD with z pack and prednisone burst.  ? ?08/06/2021: Today - acute ?Patient presents today for acute visit. He reports over the last 6-7 weeks he has increased shortness of breath with exertion and occasionally at rest. He was treated at the end of February for AECOPD by his PCP and had some improvement with prednisone but then finished it and his symptoms returned. He feels like his activity tolerance is just not what it used to be. He does usually have a productive cough with yellow to brown sputum; however, he was started on z pack for AECOPD and then 14 days of bactrim for acute prostatitis on 3/9 and reports that his cough has significantly improved and he is not coughing much up at all now. He has not noticed any wheezing, leg swelling, orthopnea, PND. He denies any hemoptysis, weight loss or anorexia. He was tried on Anoro in the past but was unable to tolerate it due to his glaucoma. He was then tried on [facility] but felt like it didn't make much of a difference and went back to his Flovent. He now feels like the flovent doesn't work as well as it used to and he has had to use his nebulizer more often. He also reports some increased allergy symptoms and  feels like his zyrtec isn't as effective as it used to be.  ? ?FeNO 31 ppb  ? ?Allergies  ?Allergen Reactions  ? Beta Adrenergic Blockers Diarrhea and Other (See Comments)  ?  Does not recall an allergy  ? Lasix [Furosemide] Rash  ? Cefzil [Cefprozil] Nausea Only  ?  Patient does recall allergy  ? Ciprofloxacin Nausea And Vomiting, Rash and Other (See Comments)  ?  Body aches ?  ? Dexamethasone Swelling  ? Gabapentin Swelling  ? Methocarbamol Swelling and Rash  ? Neomycin Other (See Comments)  ? Penicillins Swelling and Rash  ?  Has patient had a PCN reaction causing immediate rash, facial/tongue/throat swelling, SOB or lightheadedness with hypotension: yes ?Has patient had a PCN reaction causing severe rash involving mucus membranes or skin necrosis: unknown ?Has patient had a PCN reaction that required hospitalization: no ?Has patient had a PCN reaction occurring within the last 10 years: No ?If all of the above answers are "NO", then may proceed with Cephalosporin use. ?  ? Tetracyclines & Related Itching  ? ? ?Immunization History  ?Administered Date(s) Administered  ? Influenza,inj,Quad PF,6+ Mos 03/22/2014, 04/21/2015, 02/13/2019  ? Influenza-Unspecified 02/18/2017, 03/23/2018, 04/07/2020, 02/15/2021  ? Moderna Sars-Covid-2 Vaccination 06/20/2019, 07/18/2019, 04/14/2020, 04/20/2021  ? Pneumococcal Conjugate-13 09/24/2016  ? Pneumococcal Polysaccharide-23 05/08/2013  ? Pneumococcal-Unspecified 10/24/2006  ? Zoster Recombinat (Shingrix) 08/26/2016, 02/05/2017  ? ? ?  Past Medical History:  ?Diagnosis Date  ? AAA (abdominal aortic aneurysm)   ? needs yearly ultrasound  ? Allergy   ? Anemia   ? Arthritis   ? Asthma   ? BCC (basal cell carcinoma) 08/18/1989  ? left shoulder blad, upper right arm, left arm beyond elbow, c&d  ? BCC (basal cell carcinoma) 01/31/1992  ? Posterior neck, curetx3, 59f  ? BCC (basal cell carcinoma) 11/22/2001  ? mid forehead, cx3, excision, right forearm, cx3, 554f ? BCC (basal cell  carcinoma) 10/09/2003  ? mid forehead, MOHs  ? BCC (basal cell carcinoma) 08/15/2008  ? upper left back, biopsy  ? BPH (benign prostatic hyperplasia)   ? CAD (coronary artery disease)   ? [facility]  ? skin cancer  ? [facility] [place]) [place]  ? Dysrhythmia   ? pt. states it can be fast at times  ? GERD (gastroesophageal reflux disease)   ? Glaucoma   ? HOH (hard of hearing)   ? Hypercholesterolemia   ? Hypertension   ? Impaired fasting glucose   ? Low back pain   ? Melanoma in situ ([facility]  ? left chin, MOHs  ? MI ([facility]ocardial infarction) [facility]  ? SCC (squamous cell carcinoma) 07/03/2014  ? in situ, behind left ear, cx3, cautery, 61f49f? SCC (squamous cell carcinoma) 07/03/2014  ? well diff, left forearm, biopsy, cx1, cautery  ? SCC (squamous cell carcinoma) 07/20/2017  ? in situ, left upper arm, cx3, 61fu1f SCC (squamous cell carcinoma) 01/10/2019  ? in situ, left post shoulder, cx3, 61fu 79fSCC (squamous cell carcinoma) 11/22/2001  ? left forearm distal, left forearm, cx3, 61fu  46fCC (squamous cell carcinoma) 10/09/2003  ? [place], left ear post, clear per st, right cheek clear  ? SCC (squamous cell carcinoma) 03/30/2004  ? in situ, left upper arm, cx3, 61fu  ?26fC (squamous cell carcinoma) 03/08/2005  ? in situ, right cheek, mid upper forehead, cx3, 61fu  ? 68f (squamous cell carcinoma) 06/08/2006  ? in situ, left shoulder, cx3, 61fu  ? S68f(squamous cell carcinoma) 05/05/2010  ? right inner wrist, biopsy  ? SCC (squamous cell carcinoma) 09/13/2013  ? in situ, right crown scalp, front scalp, biopsy  ? Thrush   ? ? ?Tobacco History: ?Social History  ? ?Tobacco Use  ?Smoking Status Former  ? Packs/day: 1.00  ? Years: 35.00  ? Pack years: 35.00  ? Types: Cigarettes  ? Quit date: 03/28/2003  ? Years since quitting: 18.3  ?Smokeless Tobacco Never  ? ?Counseling given: Not Answered ? ? ?Outpatient Medications Prior to Visit  ?Medication Sig Dispense Refill  ? albuterol (PROVENTIL)  (2.5 MG/3ML) 0.083% nebulizer solution Take 3 mLs (2.5 mg total) by nebulization every 6 (six) hours as needed for wheezing or shortness of breath. 100 mL 2  ? albuterol (VENTOLIN HFA) 108 (90 Base) MCG/ACT inhaler 2 puffs as needed    ? alfuzosin (UROXATRAL) 10 MG 24 hr tablet Take 1 tablet (10 mg total) by mouth daily with breakfast. 90 tablet 3  ? benzonatate (TESSALON) 200 MG capsule Take 1 capsule (200 mg total) by mouth 3 (three) times daily as needed for cough. (Patient not taking: Reported on 08/06/2021) 30 capsule 0  ? cetirizine (ZYRTEC) 10 MG tablet Take 10 mg by mouth daily.    ? COMBIGAN 0.2-0.5 % ophthalmic solution Apply 1 drop to eye 2 (two) times daily.    ?  esomeprazole (NEXIUM) 40 MG capsule Take 40 mg by mouth daily.    ? hydroxypropyl methylcellulose / hypromellose (ISOPTO TEARS / GONIOVISC) 2.5 % ophthalmic solution Place 1 drop into both eyes 3 (three) times daily as needed for dry eyes.    ? LORazepam (ATIVAN) 0.5 MG tablet TAKE 1 TABLET BY MOUTH ONCE DAILY AT BEDTIME. 30 tablet 3  ? losartan (COZAAR) 25 MG tablet TAKE ONE TABLET BY MOUTH ONCE DAILY. 90 tablet 1  ? lovastatin (MEVACOR) 40 MG tablet Take 1 tablet (40 mg total) by mouth daily. 90 tablet 0  ? nitroGLYCERIN (NITROSTAT) 0.4 MG SL tablet Place 1 tablet (0.4 mg total) under the tongue every 5 (five) minutes as needed for chest pain. 25 tablet 5  ? oxymetazoline (AFRIN) 0.05 % nasal spray Place 1 spray into both nostrils 2 (two) times daily.    ? sulfamethoxazole-trimethoprim (BACTRIM DS) 800-160 MG tablet Take 1 tablet by mouth 2 (two) times daily for 14 days. 28 tablet 0  ? fluticasone (FLOVENT HFA) 220 MCG/ACT inhaler Inhale 2 puffs into the lungs in the morning and at bedtime. 12 g 5  ? ?No facility-administered medications prior to visit.  ? ? ? ?Review of Systems:  ? ?Constitutional: No weight loss or gain, night sweats, fevers, chills, fatigue, or lassitude. ?HEENT: No headaches, difficulty swallowing, tooth/dental problems, or  sore throat. + sneezing, itchy eyes, nasal congestion ?CV:  No chest pain, orthopnea, PND, swelling in lower extremities, anasarca, dizziness, palpitations, syncope ?Resp: +shortness of breath with exertion, occa

## 2021-08-06 NOTE — Progress Notes (Signed)
Please notify patient that his eosinophils were mildly elevated to 300. I have sent an order for singulair 10 mg At bedtime to his pharmacy. This could be contributing to his allergy symptoms and triggering the flares in his AECOPD. Have him notify of any mood changes. Usually tolerated well by patients but can cause more vivid dreams. Thanks.

## 2021-08-06 NOTE — Assessment & Plan Note (Signed)
Well-controlled on current PPI regimen. ?

## 2021-08-06 NOTE — Assessment & Plan Note (Signed)
Hx of eosinophilia and elevated FeNO today. Check IgE and repeat CBC with diff. Continue trigger prevention with Zyrtec. Will add singulair if elevated eosinophils present.  ?

## 2021-08-06 NOTE — Assessment & Plan Note (Signed)
Currently on 14 day course of bactrim, which has resolved productive cough. Feeling better from this standpoint. Follow up with PCP as scheduled.  ?

## 2021-08-06 NOTE — Addendum Note (Signed)
Addended by: Clayton Bibles on: 08/06/2021 04:46 PM ? ? Modules accepted: Orders ? ?

## 2021-08-07 LAB — IGE: IgE (Immunoglobulin E), Serum: 97 kU/L (ref <=114)

## 2021-08-07 LAB — ALPHA-1-ANTITRYPSIN: A-1 Antitrypsin, Ser: 138 mg/dL (ref 83–199)

## 2021-08-07 NOTE — Progress Notes (Signed)
Patient was called and notified.

## 2021-08-09 IMAGING — CT CT ABD-PELV W/O CM
2 of 4 series · 15 of 46 positions shown, 17 images · non-contrast
Comparison: 11/13/2019, 11/11/2019

CLINICAL DATA: Sudden onset right inguinal pain since 4 p.m.,
history of prior umbilical and bilateral inguinal hernias

EXAM:
CT ABDOMEN AND PELVIS WITHOUT CONTRAST
TECHNIQUE: Multidetector CT imaging of the abdomen and pelvis was performed
following the standard protocol without IV contrast.

[Series 2: axial st · axial · 0.84mm/px · z∈[+970,+1370]mm · 12 of 90 slices shown, 14 images]
[im 5/90  soft-tissue]
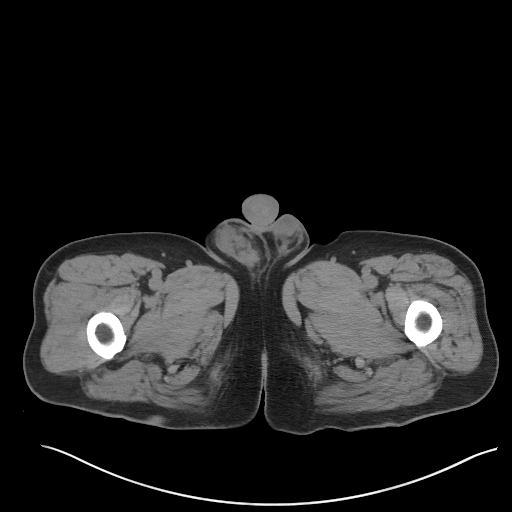
[im 5/90  bone]
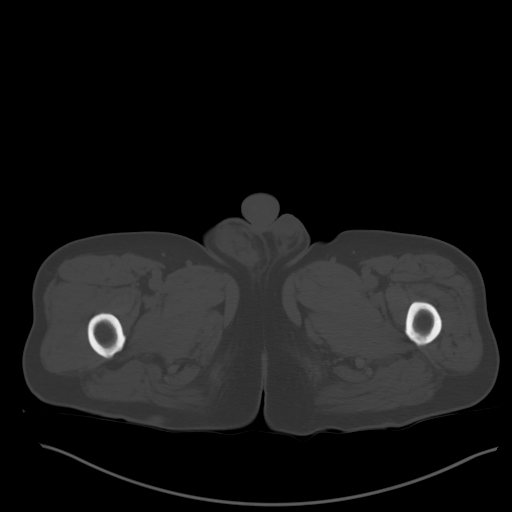
[im 15/90  soft-tissue]
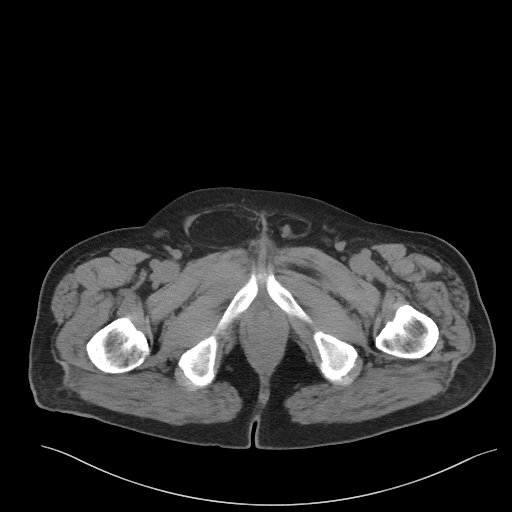
[im 20/90  soft-tissue]
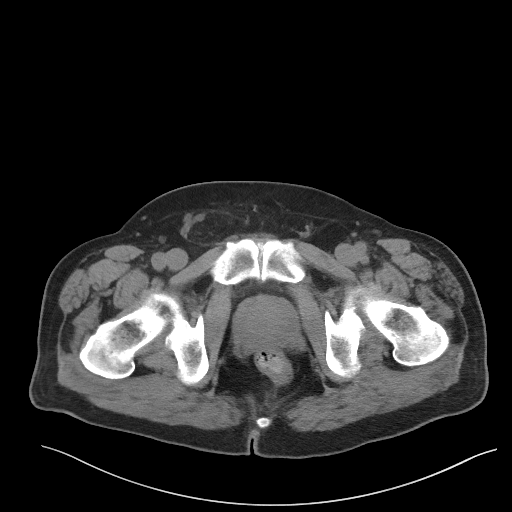
[im 25/90  soft-tissue]
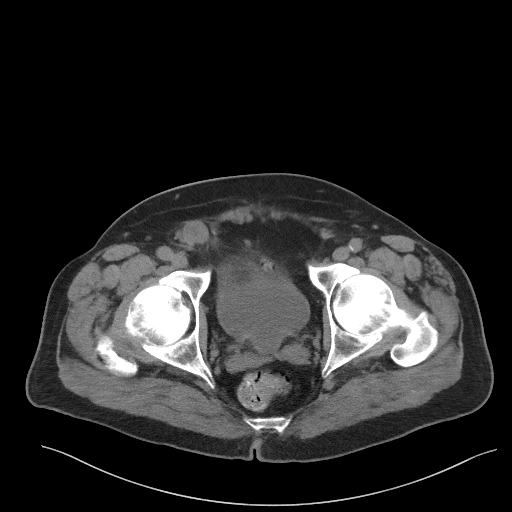
[im 35/90  soft-tissue]
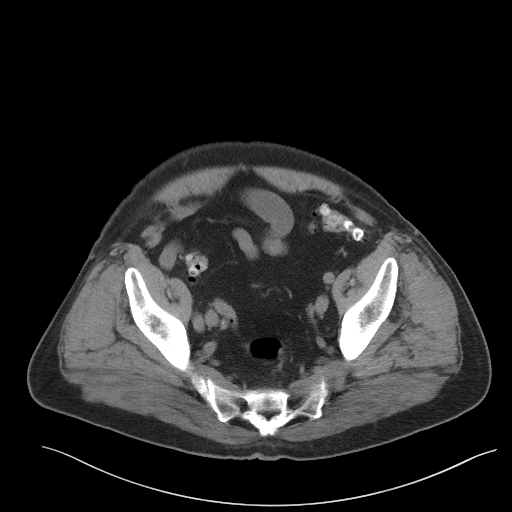
[im 40/90  soft-tissue]
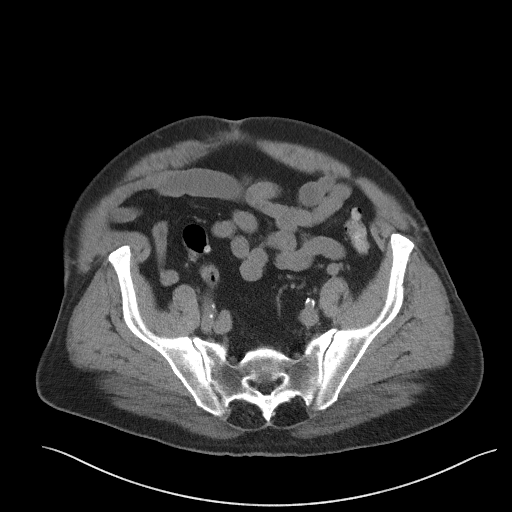
[im 50/90  soft-tissue]
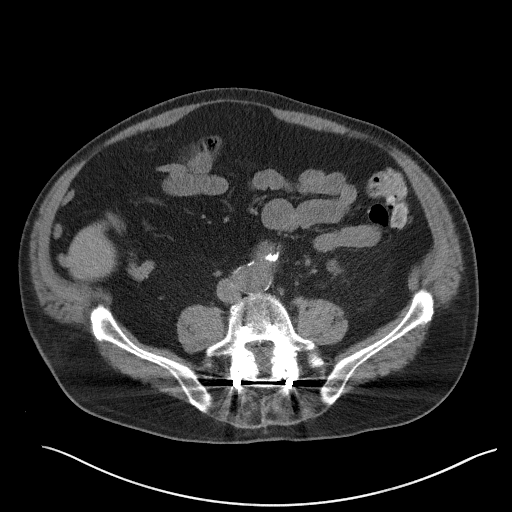
[im 55/90  soft-tissue]
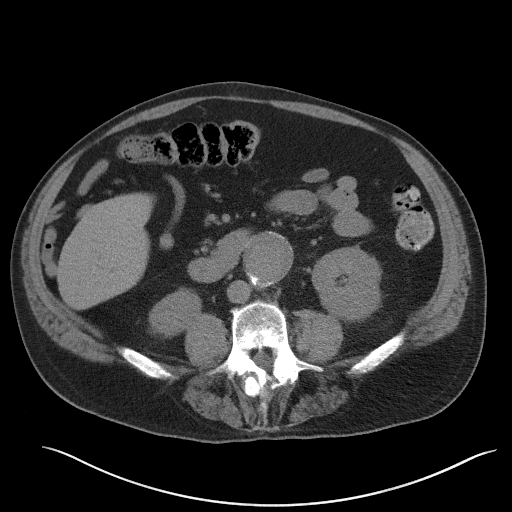
[im 65/90  soft-tissue]
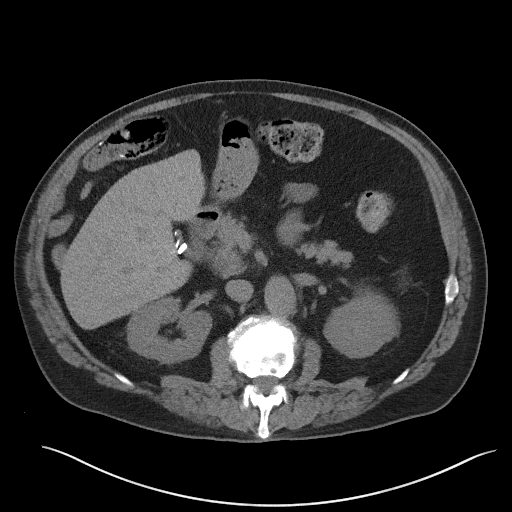
[im 65/90  bone]
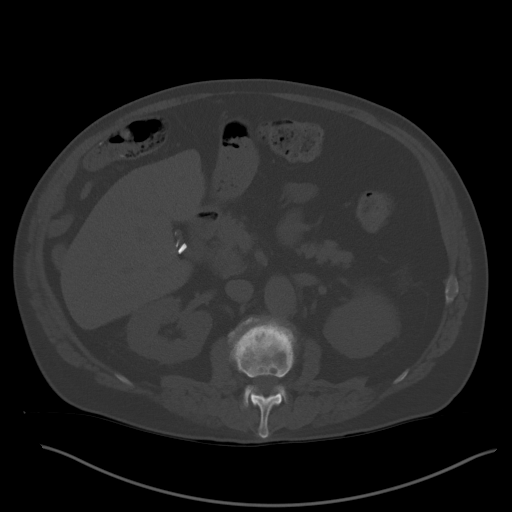
[im 70/90  soft-tissue]
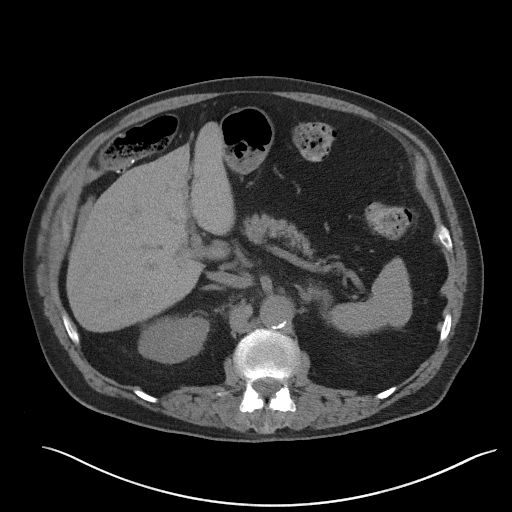
[im 75/90  soft-tissue]
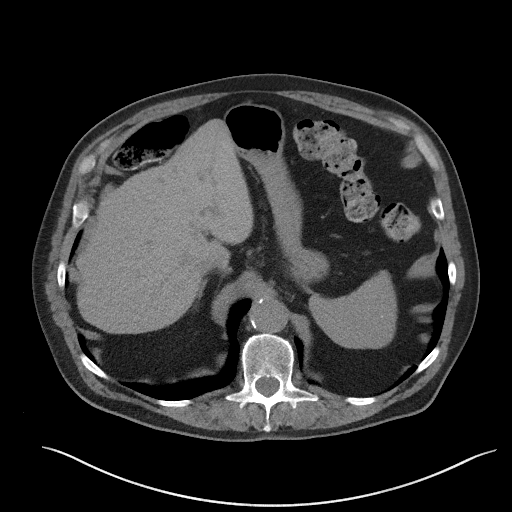
[im 85/90  soft-tissue]
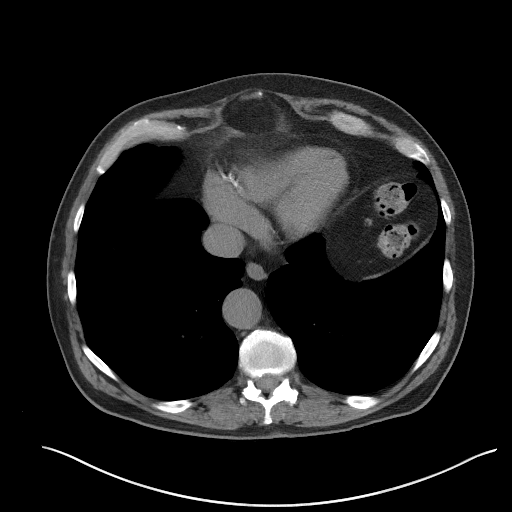

[Series 5: coronal st · coronal · 0.78mm/px · 3 of 104 slices shown]
[im 35/104  soft-tissue]
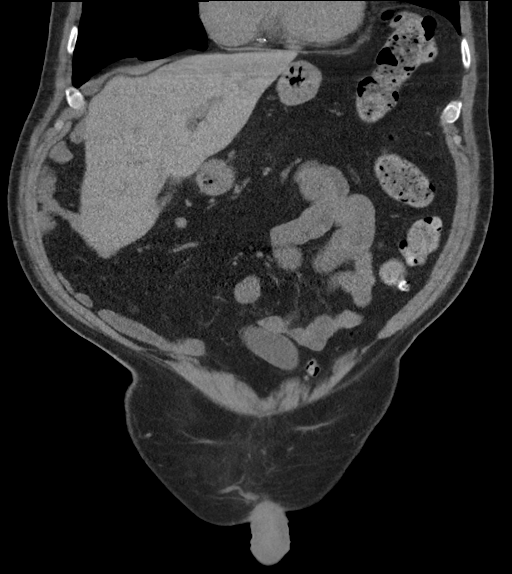
[im 46/104  soft-tissue]
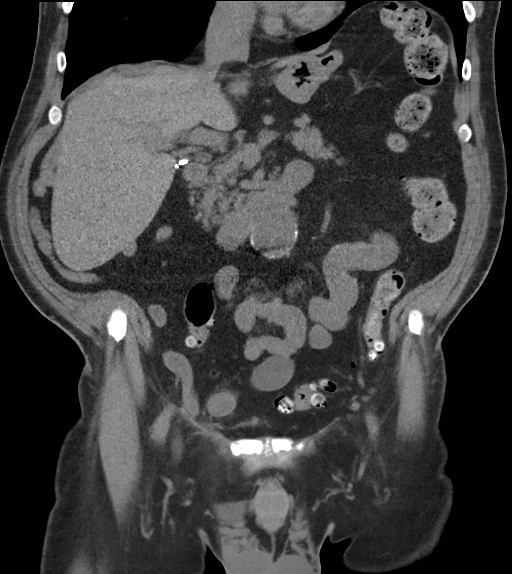
[im 58/104  soft-tissue]
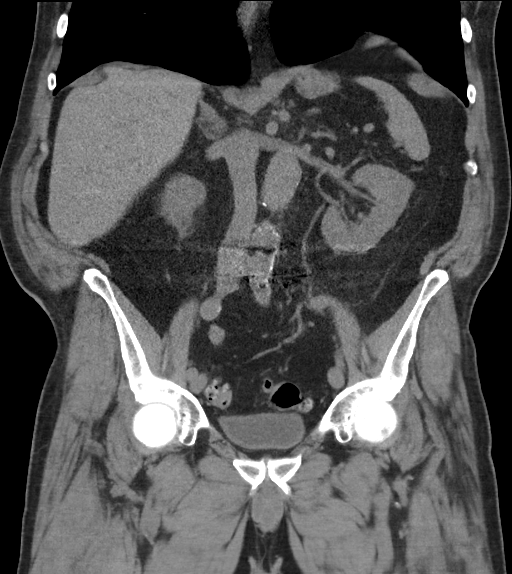

[15 of 46 positions shown; findings below may reference images not displayed]

FINDINGS: Lower chest: There is mild right lower lobe bronchial wall
thickening with patchy tree in bud ground-glass airspace disease,
favor inflammation, chronic indolent infection, or less likely
aspiration. No effusion or pneumothorax.

Hepatobiliary: No focal liver abnormality is seen. Status post
cholecystectomy. No biliary dilatation.

Pancreas: Unremarkable. No pancreatic ductal dilatation or
surrounding inflammatory changes.

Spleen: Normal in size without focal abnormality.

Adrenals/Urinary Tract: Punctate less than 2 mm nonobstructing
calculi lower pole right kidney. Stable upper pole right renal cyst.
Left kidney is unremarkable. The adrenals and bladder are normal.

Stomach/Bowel: There is extensive diverticulosis of the distal
colon, with no evidence of diverticulitis. Postsurgical changes are
seen from proximal colectomy and ileocolic anastomosis in the right
upper quadrant. No bowel obstruction or ileus. No bowel wall
thickening or inflammatory change.

Vascular/Lymphatic: Stable 4.9 cm infrarenal abdominal aortic
aneurysm. Evaluation of the aortic lumen is limited by the lack of
IV contrast. Stable diffuse aortic atherosclerosis. No pathologic
adenopathy.

Reproductive: Stable mild enlargement of the prostate.

Other: There is a right inguinal hernia. A small portion of distal
small bowel extends into the proximal aspect of the hernia sac, with
no evidence of obstruction or incarceration.

No free fluid or free gas.

Musculoskeletal: No acute or destructive bony lesions. Stable
postsurgical changes of the lower lumbar spine. Stable postsurgical
changes at L5/S1. Stable spondylosis at L2/L3 and L3/L4.
Reconstructed images demonstrate no additional findings.
IMPRESSION: 1. Right inguinal hernia, containing a small portion of distal small
bowel within the proximal aspect of the hernia sac. No evidence of
bowel incarceration or obstruction.
2. Punctate nonobstructing right renal calculi.
3. Stable 4.9 cm infrarenal abdominal aortic aneurysm. Recommend
follow-up CT/MR every 6 months and vascular consultation. This
recommendation follows ACR consensus guidelines: White Paper of the
ACR Incidental Findings Committee II on Vascular Findings. [HOSPITAL] 8316; [DATE].
4. Mild right lower lobe bronchial wall thickening with scattered
tree-in-bud ground-glass opacities, consistent with inflammatory
change, indolent infection such as URBIZO, or less likely aspiration.

## 2021-08-21 ENCOUNTER — Ambulatory Visit: Payer: Medicare Other | Admitting: Pulmonary Disease

## 2021-08-25 ENCOUNTER — Ambulatory Visit (INDEPENDENT_AMBULATORY_CARE_PROVIDER_SITE_OTHER): Payer: Medicare Other | Admitting: Family Medicine

## 2021-08-25 ENCOUNTER — Ambulatory Visit: Payer: Medicare Other | Admitting: Pulmonary Disease

## 2021-08-25 VITALS — BP 108/72 | HR 64 | Temp 97.5°F | Ht 73.0 in | Wt 206.6 lb

## 2021-08-25 DIAGNOSIS — R600 Localized edema: Secondary | ICD-10-CM | POA: Diagnosis not present

## 2021-08-25 NOTE — Progress Notes (Signed)
? ?Subjective:  ?Patient ID: [name] [name], male    DOB: 04-29-1941  Age: 81 y.o. MRN: [id number] ? ?CC: ?Chief Complaint  ?Patient presents with  ? feet and ankles swelling  ?  Past couple of day. Last night feet and ankles were really swollen. They have been tingling through the night  ? ? ?HPI: ? ?81 year old male presents for evaluation the above. ? ?Patient reports that he has noticed over the past few days that his shoes have been tight.  Last night noted significant edema of his lower extremities.  He reports that he has never had this before.  He is quite concerned.  He has baseline shortness of breath due to COPD.  He states that he has been fatigued as of late.  He has had some intermittent lightheadedness.  Patient states that he thought it best that he come in for evaluation.  No chest pain.  No relieving factors.  No other associated symptoms.   ? ?Patient Active Problem List  ? Diagnosis Date Noted  ? Lower extremity edema 08/25/2021  ? Seasonal allergies 08/06/2021  ? COPD (chronic obstructive pulmonary disease) ([place]) 04/23/2021  ? BPH (benign prostatic hyperplasia) 04/23/2021  ? CAD (coronary artery disease) 04/23/2021  ? Frequent nosebleeds 04/23/2021  ? HTN (hypertension) 09/16/2016  ? Lumbar spondylosis 11/05/2014  ? GERD (gastroesophageal reflux disease) 01/19/2012  ? Mixed hyperlipidemia 04/15/2008  ? AAA (abdominal aortic aneurysm) ([place]) 04/15/2008  ? ? ?Social Hx   ?Social History  ? ?Socioeconomic History  ? Marital status: Married  ?  Spouse name: Not on file  ? Number of children: Not on file  ? Years of education: Not on file  ? Highest education level: Not on file  ?Occupational History  ? Not on file  ?Tobacco Use  ? Smoking status: Former  ?  Packs/day: 1.00  ?  Years: 35.00  ?  Pack years: 35.00  ?  Types: Cigarettes  ?  Quit date: 03/28/2003  ?  Years since quitting: 18.4  ? Smokeless tobacco: Never  ?Vaping Use  ? Vaping Use: Never used  ?Substance and Sexual Activity  ? Alcohol use:  No  ?  Alcohol/week: 0.0 standard drinks  ? Drug use: No  ? Sexual activity: Not Currently  ?  Birth control/protection: None  ?Other Topics Concern  ? Not on file  ?Social History Narrative  ? Not on file  ? ?Social Determinants of Health  ? ?Financial Resource Strain: Not on file  ?Food Insecurity: Not on file  ?Transportation Needs: Not on file  ?Physical Activity: Not on file  ?Stress: Not on file  ?Social Connections: Not on file  ? ? ?Review of Systems ?Per HPI ? ?Objective:  ?BP 108/72   Pulse 64   Temp (!) 97.5 ?F (36.4 ?C) (Oral)   Ht 6' 1"  (1.854 m)   Wt 206 lb 9.6 oz (93.7 kg)   SpO2 98%   BMI 27.26 kg/m?  ? ? ?  08/25/2021  ?  9:22 AM 08/06/2021  ? 10:21 AM 07/23/2021  ? 11:07 AM  ?BP/Weight  ?Systolic BP 144 315 400  ?Diastolic BP 72 62 86  ?Wt. (Lbs) 206.6 203.4 203.8  ?BMI 27.26 kg/m2 26.84 kg/m2 26.89 kg/m2  ? ? ?Physical Exam ?Vitals and nursing note reviewed.  ?Constitutional:   ?   General: He is not in acute distress. ?   Appearance: Normal appearance. He is not ill-appearing.  ?HENT:  ?   Head: Normocephalic and  atraumatic.  ?Eyes:  ?   General:     ?   Right eye: No discharge.     ?   Left eye: No discharge.  ?   Conjunctiva/sclera: Conjunctivae normal.  ?Cardiovascular:  ?   Rate and Rhythm: Normal rate and regular rhythm.  ?   Comments: 1-2+ pitting lower extremity edema. ?Pulmonary:  ?   Effort: Pulmonary effort is normal.  ?   Breath sounds: Rales present.  ?Neurological:  ?   Mental Status: He is alert.  ?Psychiatric:     ?   Mood and Affect: Mood normal.     ?   Behavior: Behavior normal.  ? ? ?Lab Results  ?Component Value Date  ? WBC 8.3 08/06/2021  ? HGB 12.9 (L) 08/06/2021  ? HCT 39.6 08/06/2021  ? PLT 265.0 08/06/2021  ? GLUCOSE 101 (H) 04/23/2021  ? CHOL 124 04/23/2021  ? TRIG 109 04/23/2021  ? HDL 39 (L) 04/23/2021  ? LDLCALC 65 04/23/2021  ? ALT 14 04/23/2021  ? AST 18 04/23/2021  ? NA 142 04/23/2021  ? K 4.7 04/23/2021  ? CL 102 04/23/2021  ? CREATININE 0.86 05/21/2021  ? BUN  12 05/21/2021  ? CO2 26 04/23/2021  ? TSH 1.230 02/14/2019  ? PSA 1.80 07/26/2014  ? HGBA1C 6.1 (H) 04/23/2021  ? ? ? ?Assessment & Plan:  ? ?Problem List Items Addressed This Visit   ? ?  ? Other  ? Lower extremity edema - Primary  ?  Likely related to venous stasis.  Labs today including BNP for further evaluation.  Advised elevation of lower extremities. ?  ?  ? Relevant Orders  ? CMP14+EGFR  ? Brain natriuretic peptide  ? ?Thersa Salt DO ?[place] ? ?

## 2021-08-25 NOTE — Patient Instructions (Signed)
Labs today. ? ?Elevated legs. ? ?Watch sugar and salt intake. ? ?We will call with results. ? ?Take care ? ?Dr. Lacinda Axon  ?

## 2021-08-25 NOTE — Assessment & Plan Note (Signed)
Likely related to venous stasis.  Labs today including BNP for further evaluation.  Advised elevation of lower extremities. ?

## 2021-08-26 LAB — CMP14+EGFR
ALT: 14 IU/L (ref 0–44)
AST: 16 IU/L (ref 0–40)
Albumin/Globulin Ratio: 1.7 (ref 1.2–2.2)
Albumin: 4.2 g/dL (ref 3.7–4.7)
Alkaline Phosphatase: 73 IU/L (ref 44–121)
BUN/Creatinine Ratio: 13 (ref 10–24)
BUN: 12 mg/dL (ref 8–27)
Bilirubin Total: 0.5 mg/dL (ref 0.0–1.2)
CO2: 27 mmol/L (ref 20–29)
Calcium: 9.4 mg/dL (ref 8.6–10.2)
Chloride: 103 mmol/L (ref 96–106)
Creatinine, Ser: 0.91 mg/dL (ref 0.76–1.27)
Globulin, Total: 2.5 g/dL (ref 1.5–4.5)
Glucose: 108 mg/dL — ABNORMAL HIGH (ref 70–99)
Potassium: 4.4 mmol/L (ref 3.5–5.2)
Sodium: 145 mmol/L — ABNORMAL HIGH (ref 134–144)
Total Protein: 6.7 g/dL (ref 6.0–8.5)
eGFR: 86 mL/min/{1.73_m2} (ref >59)

## 2021-08-26 LAB — BRAIN NATRIURETIC PEPTIDE: BNP: 76.4 pg/mL (ref 0.0–100.0)

## 2021-08-27 ENCOUNTER — Ambulatory Visit (INDEPENDENT_AMBULATORY_CARE_PROVIDER_SITE_OTHER): Payer: Medicare Other | Admitting: Family Medicine

## 2021-08-27 DIAGNOSIS — H6121 Impacted cerumen, right ear: Secondary | ICD-10-CM

## 2021-08-28 DIAGNOSIS — H6120 Impacted cerumen, unspecified ear: Secondary | ICD-10-CM | POA: Insufficient documentation

## 2021-08-28 NOTE — Assessment & Plan Note (Signed)
Successful lavage today.  ?Supportive care. ?

## 2021-08-28 NOTE — Progress Notes (Signed)
? ?Subjective:  ?Patient ID: [name] [name], male    DOB: [birth year]  Age: 81 y.o. MRN: [id number] ? ?CC: ?Chief Complaint  ?Patient presents with  ? Cerumen Impaction  ?  Patient says he went to the doctor concerning his hearing aids and not being able to hear good. He said the doctor told him that both ears were impacted, right ear worse then left ear.  ? ? ?HPI: ? ?81 year old male presents for evaluation of the above. ? ?Patient states that he went to the audiologist yesterday.  He was concerned about his hearing aids as he has not been hearing as well.  He states that he was told that he had a lot of cerumen and that he should see a doctor for removal.  He is here today for examination and cerumen removal. ? ?Patient Active Problem List  ? Diagnosis Date Noted  ? Cerumen impaction 08/28/2021  ? Lower extremity edema 08/25/2021  ? Seasonal allergies 08/06/2021  ? COPD (chronic obstructive pulmonary disease) ([place]) 04/23/2021  ? BPH (benign prostatic hyperplasia) 04/23/2021  ? CAD (coronary artery disease) 04/23/2021  ? Frequent nosebleeds 04/23/2021  ? HTN (hypertension) 09/16/2016  ? Lumbar spondylosis 11/05/2014  ? GERD (gastroesophageal reflux disease) 01/19/2012  ? Mixed hyperlipidemia 04/15/2008  ? AAA (abdominal aortic aneurysm) ([place]) 04/15/2008  ? ? ?Social Hx   ?Social History  ? ?Socioeconomic History  ? Marital status: Married  ?  Spouse name: Not on file  ? Number of children: Not on file  ? Years of education: Not on file  ? Highest education level: Not on file  ?Occupational History  ? Not on file  ?Tobacco Use  ? Smoking status: Former  ?  Packs/day: 1.00  ?  Years: 35.00  ?  Pack years: 35.00  ?  Types: Cigarettes  ?  Quit date: 03/28/2003  ?  Years since quitting: 18.4  ? Smokeless tobacco: Never  ?Vaping Use  ? Vaping Use: Never used  ?Substance and Sexual Activity  ? Alcohol use: No  ?  Alcohol/week: 0.0 standard drinks  ? Drug use: No  ? Sexual activity: Not Currently  ?  Birth  control/protection: None  ?Other Topics Concern  ? Not on file  ?Social History Narrative  ? Not on file  ? ?Social Determinants of Health  ? ?Financial Resource Strain: Not on file  ?Food Insecurity: Not on file  ?Transportation Needs: Not on file  ?Physical Activity: Not on file  ?Stress: Not on file  ?Social Connections: Not on file  ? ? ?Review of Systems ?Per HPI ? ?Objective:  ?BP 130/70   Pulse 73   Temp 97.7 ?F (36.5 ?C) (Oral)   Ht '6\' 1"'$  (1.854 m)   Wt 204 lb 9.6 oz (92.8 kg)   SpO2 95%   BMI 26.99 kg/m?  ? ? ?  08/27/2021  ? 10:29 AM 08/25/2021  ?  9:22 AM 08/06/2021  ? 10:21 AM  ?BP/Weight  ?Systolic BP 474 259 563  ?Diastolic BP 70 72 62  ?Wt. (Lbs) 204.6 206.6 203.4  ?BMI 26.99 kg/m2 27.26 kg/m2 26.84 kg/m2  ? ? ?Physical Exam ?Vitals and nursing note reviewed.  ?Constitutional:   ?   General: He is not in acute distress. ?   Appearance: Normal appearance.  ?HENT:  ?   Head: Normocephalic and atraumatic.  ?   Ears:  ?   Comments: Left ear -minimal cerumen.  Normal TM. ?Right TM with partial cerumen impaction. ?Pulmonary:  ?  Effort: Pulmonary effort is normal. No respiratory distress.  ?Neurological:  ?   Mental Status: He is alert.  ?Psychiatric:     ?   Mood and Affect: Mood normal.     ?   Behavior: Behavior normal.  ? ? ?Lab Results  ?Component Value Date  ? WBC 8.3 08/06/2021  ? HGB 12.9 (L) 08/06/2021  ? HCT 39.6 08/06/2021  ? PLT 265.0 08/06/2021  ? GLUCOSE 108 (H) 08/25/2021  ? CHOL 124 04/23/2021  ? TRIG 109 04/23/2021  ? HDL 39 (L) 04/23/2021  ? LDLCALC 65 04/23/2021  ? ALT 14 08/25/2021  ? AST 16 08/25/2021  ? NA 145 (H) 08/25/2021  ? K 4.4 08/25/2021  ? CL 103 08/25/2021  ? CREATININE 0.91 08/25/2021  ? BUN 12 08/25/2021  ? CO2 27 08/25/2021  ? TSH 1.230 02/14/2019  ? PSA 1.80 07/26/2014  ? HGBA1C 6.1 (H) 04/23/2021  ? ? ? ?Assessment & Plan:  ? ?Problem List Items Addressed This Visit   ? ?  ? Nervous and Auditory  ? Cerumen impaction  ?  Successful lavage today.  ?Supportive care. ?  ?   ? ?Thersa Salt DO ?[place] ? ?

## 2021-08-31 DIAGNOSIS — R809 Proteinuria, unspecified: Secondary | ICD-10-CM | POA: Diagnosis not present

## 2021-08-31 DIAGNOSIS — I2510 Atherosclerotic heart disease of native coronary artery without angina pectoris: Secondary | ICD-10-CM | POA: Diagnosis not present

## 2021-08-31 DIAGNOSIS — N182 Chronic kidney disease, stage 2 (mild): Secondary | ICD-10-CM | POA: Diagnosis not present

## 2021-08-31 DIAGNOSIS — J449 Chronic obstructive pulmonary disease, unspecified: Secondary | ICD-10-CM | POA: Diagnosis not present

## 2021-08-31 DIAGNOSIS — I129 Hypertensive chronic kidney disease with stage 1 through stage 4 chronic kidney disease, or unspecified chronic kidney disease: Secondary | ICD-10-CM | POA: Diagnosis not present

## 2021-08-31 DIAGNOSIS — E785 Hyperlipidemia, unspecified: Secondary | ICD-10-CM | POA: Diagnosis not present

## 2021-09-03 DIAGNOSIS — H01135 Eczematous dermatitis of left lower eyelid: Secondary | ICD-10-CM | POA: Diagnosis not present

## 2021-09-03 DIAGNOSIS — H01134 Eczematous dermatitis of left upper eyelid: Secondary | ICD-10-CM | POA: Diagnosis not present

## 2021-09-03 DIAGNOSIS — H10502 Unspecified blepharoconjunctivitis, left eye: Secondary | ICD-10-CM | POA: Diagnosis not present

## 2021-09-03 DIAGNOSIS — H02132 Senile ectropion of right lower eyelid: Secondary | ICD-10-CM | POA: Diagnosis not present

## 2021-09-04 DIAGNOSIS — R809 Proteinuria, unspecified: Secondary | ICD-10-CM | POA: Diagnosis not present

## 2021-09-14 ENCOUNTER — Encounter: Payer: Self-pay | Admitting: Pulmonary Disease

## 2021-09-14 ENCOUNTER — Ambulatory Visit (INDEPENDENT_AMBULATORY_CARE_PROVIDER_SITE_OTHER): Payer: Medicare Other | Admitting: Pulmonary Disease

## 2021-09-14 VITALS — BP 120/76 | HR 65 | Temp 97.6°F | Ht 73.0 in | Wt 206.0 lb

## 2021-09-14 DIAGNOSIS — J449 Chronic obstructive pulmonary disease, unspecified: Secondary | ICD-10-CM

## 2021-09-14 MED ORDER — ALBUTEROL SULFATE (2.5 MG/3ML) 0.083% IN NEBU: 2.5000 mg | INHALATION_SOLUTION | Freq: Four times a day (QID) | RESPIRATORY_TRACT | 5 refills | Status: DC | PRN

## 2021-09-14 NOTE — Patient Instructions (Signed)
I will see you back in about 3 to 4 months ? ?Continue using Advair ? ?Continue graded exercises as tolerated ? ?Call with significant concerns ? ? ?

## 2021-09-14 NOTE — Progress Notes (Signed)
? ?History of Present Illness ?[name] [name] is a 81 y.o. male former  smoker ( quit 2004 with a 35 pack year smoking history) with COPD with chronic bronchitis and emphysema.  ?Maintenance medication  is Advair 230 ?Rescue is Albuterol ? treated for mild exacerbation during last visit ? ?Recent treatment for an exacerbation with antibiotics ?Was recently switched from [place] to Advair 230 ? ?Appears to be tolerating this well ? ?Symptoms are better ? ?Activity level seems to be maintained ? ?Pulmonary rehab in 2022 ? ?Last seen by [facility] March 2023-exacerbation ?-Overall better ? ?Pt. Presents for follow up after  flare of bronchitis with  yellow secretions. He was seen in the office 05/26/2020 and was treated with a z pack and Mucinex. I asked him to use his flutter valve 3-4 times daily to help with his chest congestion. As he was not wheezing we did not treat with prednisone. CXR done 1/10 showed COPD with chronic bronchitis and emphysema, but no acute issues.  ? ?Test Results: ?CXR 05/26/2020 ?Emphysematous changes. No pneumothorax or pleural effusion. No focal ?consolidation. Left hemidiaphragm eventration, unchanged. Stable ?cardiomediastinal silhouette. No acute osseous abnormality. ?No focal consolidation.  Emphysema. ? ? ?  Latest Ref Rng & Units 08/06/2021  ? 11:01 AM 09/05/2020  ?  9:20 AM 03/18/2020  ?  5:08 PM  ?CBC  ?WBC 4.0 - 10.5 K/uL 8.3   6.7   10.3    ?Hemoglobin 13.0 - 17.0 g/dL 12.9   13.4   13.4    ?Hematocrit 39.0 - 52.0 % 39.6   41.3   42.9    ?Platelets 150.0 - 400.0 K/uL 265.0   214   214    ? ? ? ?  Latest Ref Rng & Units 08/25/2021  ?  9:59 AM 05/21/2021  ? 12:03 PM 04/23/2021  ? 11:32 AM  ?BMP  ?Glucose 70 - 99 mg/dL 108    101    ?BUN 8 - 27 mg/dL '12   12   12    '$ ?Creatinine 0.76 - 1.27 mg/dL 0.91   0.86   0.92    ?BUN/Creat Ratio 10 - '24 13   14   13    '$ ?Sodium 134 - 144 mmol/L 145    142    ?Potassium 3.5 - 5.2 mmol/L 4.4    4.7    ?Chloride 96 - 106 mmol/L 103    102    ?CO2 20 - 29 mmol/L 27     26    ?Calcium 8.6 - 10.2 mg/dL 9.4    9.2    ? ? ?BNP ?   ?Component Value Date/Time  ? BNP 76.4 08/25/2021 0959  ? BNP 44.0 09/16/2016 0235  ? ? ?ProBNP ?No results found for: PROBNP ? ?PFT ?   ?Component Value Date/Time  ? FEV1PRE 1.58 11/05/2019 1342  ? FEV1POST 1.72 11/05/2019 1342  ? FVCPRE 2.80 11/05/2019 1342  ? FVCPOST 2.82 11/05/2019 1342  ? DLCOUNC 32.71 11/05/2019 1342  ? PREFEV1FVCRT 56 11/05/2019 1342  ? PSTFEV1FVCRT 61 11/05/2019 1342  ? ? ?No results found. ? ? ?Past medical hx ?Past Medical History:  ?Diagnosis Date  ? AAA (abdominal aortic aneurysm) ([place])   ? needs yearly ultrasound  ? Allergy   ? Anemia   ? Arthritis   ? Asthma   ? BCC (basal cell carcinoma) 08/18/1989  ? left shoulder blad, upper right arm, left arm beyond elbow, c&d  ? BCC (basal cell carcinoma) 01/31/1992  ?  Posterior neck, curetx3, 70f  ? BCC (basal cell carcinoma) 11/22/2001  ? mid forehead, cx3, excision, right forearm, cx3, 532f ? BCC (basal cell carcinoma) 10/09/2003  ? mid forehead, MOHs  ? BCC (basal cell carcinoma) 08/15/2008  ? upper left back, biopsy  ? BPH (benign prostatic hyperplasia)   ? CAD (coronary artery disease)   ? [facility]  ? skin cancer  ? COPD (chronic obstructive pulmonary disease) ([place]  ? Dysrhythmia   ? pt. states it can be fast at times  ? GERD (gastroesophageal reflux disease)   ? Glaucoma   ? HOH (hard of hearing)   ? Hypercholesterolemia   ? Hypertension   ? Impaired fasting glucose   ? Low back pain   ? Melanoma in situ ([facility]2005  ? left chin, MOHs  ? MI (myocardial infarction) ([place]  ? SCC (squamous cell carcinoma) 07/03/2014  ? in situ, behind left ear, cx3, cautery, 42f78f? SCC (squamous cell carcinoma) 07/03/2014  ? well diff, left forearm, biopsy, cx1, cautery  ? SCC (squamous cell carcinoma) 07/20/2017  ? in situ, left upper arm, cx3, 42fu39f SCC (squamous cell carcinoma) 01/10/2019  ? in situ, left post shoulder, cx3, 42fu 36fSCC (squamous cell carcinoma) 11/22/2001  ?  left forearm distal, left forearm, cx3, 42fu  74fCC (squamous cell carcinoma) 10/09/2003  ? [place], left ear post, clear per st, right cheek clear  ? SCC (squamous cell carcinoma) 03/30/2004  ? in situ, left upper arm, cx3, 42fu  ?4fC (squamous cell carcinoma) 03/08/2005  ? in situ, right cheek, mid upper forehead, cx3, 42fu  ? 53f (squamous cell carcinoma) 06/08/2006  ? in situ, left shoulder, cx3, 42fu  ? S2f(squamous cell carcinoma) 05/05/2010  ? right inner wrist, biopsy  ? SCC (squamous cell carcinoma) 09/13/2013  ? in situ, right crown scalp, front scalp, biopsy  ? Thrush   ?  ? ?Social History  ? ?Tobacco Use  ? Smoking status: Former  ?  Packs/day: 1.00  ?  Years: 35.00  ?  Pack years: 35.00  ?  Types: Cigarettes  ?  Quit date: 03/28/2003  ?  Years since quitting: 18.4  ? Smokeless tobacco: Never  ?Vaping Use  ? Vaping Use: Never used  ?Substance Use Topics  ? Alcohol use: No  ?  Alcohol/week: 0.0 standard drinks  ? Drug use: No  ? ? ?Mr.Kallstrom repoMonacothat he quit smoking about 18 years ago. His smoking use included cigarettes. He has a 35.00 pack-year smoking history. He has never used smokeless tobacco. He reports that he does not drink alcohol and does not use drugs. ? ?Tobacco Cessation: ?Former smoker , Quit 2004 with a 35 pack year smoking history ? ?Past surgical hx, Family hx, Social hx all reviewed. ? ?Current Outpatient Medications on File Prior to Visit  ?Medication Sig  ? albuterol (VENTOLIN HFA) 108 (90 Base) MCG/ACT inhaler 2 puffs as needed  ? alfuzosin (UROXATRAL) 10 MG 24 hr tablet Take 1 tablet (10 mg total) by mouth daily with breakfast.  ? cetirizine (ZYRTEC) 10 MG tablet Take 10 mg by mouth daily.  ? COMBIGAN 0.2-0.5 % ophthalmic solution Apply 1 drop to eye 2 (two) times daily.  ? desonide (DESOWEN) 0.05 % cream Apply topically 2 (two) times daily.  ? esomeprazole (NEXIUM) 40 MG capsule Take 40 mg by mouth daily.  ? fluticasone-salmeterol (ADVAIR HFA) 230-21 MCG/ACT inhaler Inhale 2 puffs  into  the lungs 2 (two) times daily.  ? hydroxypropyl methylcellulose / hypromellose (ISOPTO TEARS / GONIOVISC) 2.5 % ophthalmic solution Place 1 drop into both eyes 3 (three) times daily as needed for dry eyes.  ? LORazepam (ATIVAN) 0.5 MG tablet TAKE 1 TABLET BY MOUTH ONCE DAILY AT BEDTIME.  ? losartan (COZAAR) 25 MG tablet TAKE ONE TABLET BY MOUTH ONCE DAILY.  ? lovastatin (MEVACOR) 40 MG tablet Take 1 tablet (40 mg total) by mouth daily.  ? metroNIDAZOLE (METROGEL) 0.75 % gel Apply topically.  ? montelukast (SINGULAIR) 10 MG tablet Take 1 tablet (10 mg total) by mouth at bedtime.  ? nitroGLYCERIN (NITROSTAT) 0.4 MG SL tablet Place 1 tablet (0.4 mg total) under the tongue every 5 (five) minutes as needed for chest pain.  ? oxymetazoline (AFRIN) 0.05 % nasal spray Place 1 spray into both nostrils 2 (two) times daily.  ? ?No current facility-administered medications on file prior to visit.  ?  ? ?Allergies  ?Allergen Reactions  ? Beta Adrenergic Blockers Diarrhea and Other (See Comments)  ?  Does not recall an allergy  ? Lasix [Furosemide] Rash  ? Cefzil [Cefprozil] Nausea Only  ?  Patient does recall allergy  ? Ciprofloxacin Nausea And Vomiting, Rash and Other (See Comments)  ?  Body aches ?  ? Dexamethasone Swelling  ? Gabapentin Swelling  ? Methocarbamol Swelling and Rash  ? Neomycin Other (See Comments)  ? Penicillins Swelling and Rash  ?  Has patient had a PCN reaction causing immediate rash, facial/tongue/throat swelling, SOB or lightheadedness with hypotension: yes ?Has patient had a PCN reaction causing severe rash involving mucus membranes or skin necrosis: unknown ?Has patient had a PCN reaction that required hospitalization: no ?Has patient had a PCN reaction occurring within the last 10 years: No ?If all of the above answers are "NO", then may proceed with Cephalosporin use. ?  ? Tetracyclines & Related Itching  ? ? ?Review Of Systems: ? ?No weight loss, no fever, no chills ?No visual complaints ?No  chest pains or chest discomfort ?No shortness of breath at rest, no wheezing, does have a cough with sputum production ?No skin rash ?No genitourinary symptoms ? ?Vital Signs ?BP 120/76 (BP Location: Left

## 2021-09-16 DIAGNOSIS — H10502 Unspecified blepharoconjunctivitis, left eye: Secondary | ICD-10-CM | POA: Diagnosis not present

## 2021-09-22 ENCOUNTER — Ambulatory Visit (INDEPENDENT_AMBULATORY_CARE_PROVIDER_SITE_OTHER): Payer: Medicare Other

## 2021-09-22 ENCOUNTER — Ambulatory Visit (INDEPENDENT_AMBULATORY_CARE_PROVIDER_SITE_OTHER): Payer: Medicare Other | Admitting: Orthopedic Surgery

## 2021-09-22 ENCOUNTER — Other Ambulatory Visit: Payer: Self-pay | Admitting: Orthopedic Surgery

## 2021-09-22 ENCOUNTER — Encounter: Payer: Self-pay | Admitting: Orthopedic Surgery

## 2021-09-22 VITALS — BP 132/96 | HR 71 | Ht 73.0 in | Wt 207.0 lb

## 2021-09-22 DIAGNOSIS — M25561 Pain in right knee: Secondary | ICD-10-CM | POA: Diagnosis not present

## 2021-09-22 DIAGNOSIS — M1711 Unilateral primary osteoarthritis, right knee: Secondary | ICD-10-CM | POA: Diagnosis not present

## 2021-09-22 DIAGNOSIS — G8929 Other chronic pain: Secondary | ICD-10-CM

## 2021-09-22 NOTE — Patient Instructions (Signed)

## 2021-09-22 NOTE — Progress Notes (Signed)
New Patient Visit ? ?Assessment: ?[name] [name] is a 81 y.o. male with the following: ?1. Arthritis of right knee ? ?Plan: ?[name] [name] has advanced degenerative changes in the right knee.  Typically, ibuprofen can improve his symptoms.  However, medications have not been effective this time.  He is interested in a steroid injection.  This was completed in clinic today.  Follow-up as needed. ? ?Procedure note injection Right knee joint ?  ?Verbal consent was obtained to inject the right knee joint  ?Timeout was completed to confirm the site of injection.  The skin was prepped with alcohol and ethyl chloride was sprayed at the injection site.  ?A 21-gauge needle was used to inject 40 mg of Depo-Medrol and 1% lidocaine (3 cc) into the right knee using an anterolateral approach.  ?There were no complications. A sterile bandage was applied. ? ? ?Follow-up: ?Return if symptoms worsen or fail to improve. ? ?Subjective: ? ?Chief Complaint  ?Patient presents with  ? New Patient (Initial Visit)  ?  Previously seen by Dr.Keeling  ? Knee Pain  ?  RT/ painful x 2 weeks//burning pain medial knee  ? ? ?History of Present Illness: ?[name] [name] is a 81 y.o. male who presents for evaluation of the pain.  He has had pain in the medial aspect of the right knee off and on for many years.  In the past, he would take ibuprofen for a few days, and his symptoms were improved.  However, this time his pain has been ongoing for 2 weeks, and ibuprofen has not been effective.  He states he is taking the max doses which, and does not want to cause further issue.  He has not had a steroid injection in the right knee.  No prior injuries. ? ? ?Review of Systems: ?No fevers or chills ?No numbness or tingling ?No chest pain ?No shortness of breath ?No bowel or bladder dysfunction ?No GI distress ?No headaches ? ? ?Medical History: ? ?Past Medical History:  ?Diagnosis Date  ? AAA (abdominal aortic aneurysm) ([place])   ? needs yearly ultrasound  ?  Allergy   ? Anemia   ? Arthritis   ? Asthma   ? BCC (basal cell carcinoma) 08/18/1989  ? left shoulder blad, upper right arm, left arm beyond elbow, c&d  ? BCC (basal cell carcinoma) 01/31/1992  ? Posterior neck, curetx3, 296f  ? BCC (basal cell carcinoma) 11/22/2001  ? mid forehead, cx3, excision, right forearm, cx3, 537f ? BCC (basal cell carcinoma) 10/09/2003  ? mid forehead, MOHs  ? BCC (basal cell carcinoma) 08/15/2008  ? upper left back, biopsy  ? BPH (benign prostatic hyperplasia)   ? CAD (coronary artery disease)   ? [facility]  ? skin cancer  ? COPD (chronic obstructive pulmonary disease) ([place] [place]  ? Dysrhythmia   ? pt. states it can be fast at times  ? GERD (gastroesophageal reflux disease)   ? Glaucoma   ? HOH (hard of hearing)   ? Hypercholesterolemia   ? Hypertension   ? Impaired fasting glucose   ? Low back pain   ? Melanoma in situ ([facility]/25/2005  ? left chin, MOHs  ? MI (myocardial infarction) ([facility]  ? SCC (squamous cell carcinoma) 07/03/2014  ? in situ, behind left ear, cx3, cautery, 96f33f? SCC (squamous cell carcinoma) 07/03/2014  ? well diff, left forearm, biopsy, cx1, cautery  ? SCC (squamous cell carcinoma) 07/20/2017  ? in situ,  left upper arm, cx3, 18f  ? SCC (squamous cell carcinoma) 01/10/2019  ? in situ, left post shoulder, cx3, 51f ? SCC (squamous cell carcinoma) 11/22/2001  ? left forearm distal, left forearm, cx3, 73f13f? SCC (squamous cell carcinoma) 10/09/2003  ? [place], left ear post, clear per st, right cheek clear  ? SCC (squamous cell carcinoma) 03/30/2004  ? in situ, left upper arm, cx3, 73fu66f SCC (squamous cell carcinoma) 03/08/2005  ? in situ, right cheek, mid upper forehead, cx3, 73fu 51fSCC (squamous cell carcinoma) 06/08/2006  ? in situ, left shoulder, cx3, 73fu  70fCC (squamous cell carcinoma) 05/05/2010  ? right inner wrist, biopsy  ? SCC (squamous cell carcinoma) 09/13/2013  ? in situ, right crown scalp, front scalp, biopsy  ? Thrush   ? ? ?Past Surgical History:   ?Procedure Laterality Date  ? BACK SURGERY    ? x 3  ? CARDIAC CATHETERIZATION    ? angioplasty  ? CATARACT EXTRACTION W/PHACO  03/20/2012  ? Procedure: CATARACT EXTRACTION PHACO AND INTRAOCULAR LENS PLACEMENT (IOC);  Surgeon: CarrolWilliams Che Location: [facility];  Service: Ophthalmology;  Laterality: Right;  CDE:  8.45  ? CATARACT EXTRACTION W/PHACO Left 04/02/2013  ? Procedure: CATARACT EXTRACTION PHACO AND INTRAOCULAR LENS PLACEMENT (IOC);  Surgeon: CarrolWilliams Che Location: [facility];  Service: Ophthalmology;  Laterality: Left;  CDE:  6.50  ? CHOLECYSTECTOMY  2000  ? COLONOSCOPY  2009  ? repeat 5 years  ? ESOPHAGOGASTRODUODENOSCOPY    ? HERNIA REPAIR Left   ? inguinal  ? INGUINAL HERNIA REPAIR Right 03/28/2020  ? Procedure: Right Inguinal Herniorrhaphy with Mesh;  Surgeon: JenkinAviva Signs Location: [facility];  Service: General;  Laterality: Right;  ? LAPAROSCOPIC PARTIAL COLECTOMY N/A 06/11/2013  ? Procedure: LAPAROSCOPIC HAND ASSISTED PARTIAL COLECTOMY;  Surgeon: Neria Procter AJamesetta So Location: [facility];  Service: General;  Laterality: N/A;  ? NASAL ENDOSCOPY WITH EPISTAXIS CONTROL Bilateral 02/11/2020  ? Procedure: NASAL ENDOSCOPY WITH EPISTAXIS CONTROL;  Surgeon: Teoh, Leta Baptist [facility]: [facility];  Laterality: Bilateral;  ? right eye detached retina Bilateral   ? SPINAL FUSION  2016  ? YAG LASER APPLICATION Left 05/06/72/41/9379ocedure: YAG LASER APPLICATION;  Surgeon: CarrolWilliams Che Location: [facility];  Service: Ophthalmology;  Laterality: Left;  ? ? ?Family History  ?Problem Relation Age of Onset  ? Hypertension Mother   ? COPD Father   ? Cancer Brother   ?     brain  ? ?Social History  ? ?Tobacco Use  ? Smoking status: Former  ?  Packs/day: 1.00  ?  Years: 35.00  ?  Pack years: 35.00  ?  Types: Cigarettes  ?  Quit date: 03/28/2003  ?  Years since quitting: 18.5  ? Smokeless tobacco: Never  ?Vaping Use  ? Vaping Use: Never used  ?Substance Use Topics  ? Alcohol use:  No  ?  Alcohol/week: 0.0 standard drinks  ? Drug use: No  ? ? ?Allergies  ?Allergen Reactions  ? Beta Adrenergic Blockers Diarrhea and Other (See Comments)  ?  Does not recall an allergy  ? Lasix [Furosemide] Rash  ? Cefzil [Cefprozil] Nausea Only  ?  Patient does recall allergy  ? Ciprofloxacin Nausea And Vomiting, Rash and Other (See Comments)  ?  Body aches ?  ? Dexamethasone Swelling  ? Gabapentin Swelling  ? Methocarbamol Swelling  and Rash  ? Neomycin Other (See Comments)  ? Penicillins Swelling and Rash  ?  Has patient had a PCN reaction causing immediate rash, facial/tongue/throat swelling, SOB or lightheadedness with hypotension: yes ?Has patient had a PCN reaction causing severe rash involving mucus membranes or skin necrosis: unknown ?Has patient had a PCN reaction that required hospitalization: no ?Has patient had a PCN reaction occurring within the last 10 years: No ?If all of the above answers are "NO", then may proceed with Cephalosporin use. ?  ? Tetracyclines & Related Itching  ? ? ?Current Meds  ?Medication Sig  ? albuterol (PROVENTIL) (2.5 MG/3ML) 0.083% nebulizer solution Take 3 mLs (2.5 mg total) by nebulization every 6 (six) hours as needed for wheezing or shortness of breath.  ? albuterol (VENTOLIN HFA) 108 (90 Base) MCG/ACT inhaler 2 puffs as needed  ? alfuzosin (UROXATRAL) 10 MG 24 hr tablet Take 1 tablet (10 mg total) by mouth daily with breakfast.  ? cetirizine (ZYRTEC) 10 MG tablet Take 10 mg by mouth daily.  ? COMBIGAN 0.2-0.5 % ophthalmic solution Apply 1 drop to eye 2 (two) times daily.  ? desonide (DESOWEN) 0.05 % cream Apply topically 2 (two) times daily.  ? esomeprazole (NEXIUM) 40 MG capsule Take 40 mg by mouth daily.  ? fluticasone-salmeterol (ADVAIR HFA) 230-21 MCG/ACT inhaler Inhale 2 puffs into the lungs 2 (two) times daily.  ? hydroxypropyl methylcellulose / hypromellose (ISOPTO TEARS / GONIOVISC) 2.5 % ophthalmic solution Place 1 drop into both eyes 3 (three) times daily as  needed for dry eyes.  ? LORazepam (ATIVAN) 0.5 MG tablet TAKE 1 TABLET BY MOUTH ONCE DAILY AT BEDTIME.  ? losartan (COZAAR) 25 MG tablet TAKE ONE TABLET BY MOUTH ONCE DAILY.  ? lovastatin (MEVACOR) 40 MG tabl

## 2021-09-23 ENCOUNTER — Telehealth: Payer: Self-pay | Admitting: Pulmonary Disease

## 2021-09-24 NOTE — Telephone Encounter (Signed)
Called patient but he did not answer and his VM is not setup. Will call back later today.  ?

## 2021-10-02 ENCOUNTER — Telehealth: Payer: Self-pay | Admitting: Orthopedic Surgery

## 2021-10-02 NOTE — Telephone Encounter (Signed)
Patient called with question regarding his ongoing right knee pain, last seen on 09/22/21. Patient is aware of his appointment next Friday, 10/09/21 although states the pain is worsening at night. States ibuprofen is not helping. Asking if he may take the prednisone prescription he still has (not prescribed by Dr Amedeo Kinsman), or may something be prescribed until he is seen for his appointment?  If so, pharmacy is [facility].

## 2021-10-05 ENCOUNTER — Other Ambulatory Visit: Payer: Self-pay | Admitting: Family Medicine

## 2021-10-05 DIAGNOSIS — E785 Hyperlipidemia, unspecified: Secondary | ICD-10-CM

## 2021-10-06 NOTE — Telephone Encounter (Signed)
Pt states he started the prednisone Saturday and it helped for a few days but now the pain is back. Pt states, he's also done the rubs as well with no relief. Pt is ok to wait until his appointment to discuss further options.

## 2021-10-08 ENCOUNTER — Other Ambulatory Visit: Payer: Self-pay | Admitting: Pulmonary Disease

## 2021-10-08 ENCOUNTER — Telehealth: Payer: Self-pay | Admitting: Pulmonary Disease

## 2021-10-08 MED ORDER — PREDNISONE 20 MG PO TABS: 20.0000 mg | ORAL_TABLET | Freq: Every day | ORAL | 0 refills | Status: AC

## 2021-10-08 MED ORDER — AZITHROMYCIN 250 MG PO TABS: 250.0000 mg | ORAL_TABLET | Freq: Every day | ORAL | 0 refills | Status: DC

## 2021-10-08 NOTE — Telephone Encounter (Signed)
Per Dr Jenetta Downer:  Prescription for azithromycin sent into pharmacy plus prednisone 20 mg daily for 7 days   Rxs sent and pt made aware

## 2021-10-08 NOTE — Telephone Encounter (Signed)
Spoke with the pt  He is c/o increased cough and chest congestion over the past 3-4 days  He is producing some dark yellow to brown sputum  He states not wheezing but feels slight increase in SOB  He denies any fevers or aches  He is compliant with advair and takes albuterol inhaler and neb daily  Asking for Zpack  Please advise thanks!

## 2021-10-08 NOTE — Telephone Encounter (Signed)
Called patient but he did not answer. Left message for him to call us back.  

## 2021-10-08 NOTE — Progress Notes (Signed)
Prescription for azithromycin sent into pharmacy plus prednisone 20 mg daily for 7 days

## 2021-10-09 ENCOUNTER — Encounter: Payer: Self-pay | Admitting: Orthopedic Surgery

## 2021-10-09 ENCOUNTER — Ambulatory Visit (INDEPENDENT_AMBULATORY_CARE_PROVIDER_SITE_OTHER): Payer: Medicare Other | Admitting: Orthopedic Surgery

## 2021-10-09 DIAGNOSIS — M1711 Unilateral primary osteoarthritis, right knee: Secondary | ICD-10-CM | POA: Diagnosis not present

## 2021-10-09 MED ORDER — TRAMADOL HCL 50 MG PO TABS: 50.0000 mg | ORAL_TABLET | Freq: Four times a day (QID) | ORAL | 0 refills | Status: DC | PRN

## 2021-10-09 NOTE — Progress Notes (Signed)
Return Patient Visit  Assessment: [name] [name] is a 81 y.o. male with the following: 1. Arthritis of right knee  Plan: [name] [name] has advanced degenerative changes in the right knee.  Steroid injection provided 30% relief of symptoms.  Continues to have pain.  Ibuprofen is not sufficient.  He does not want to keep taking medications.  We discussed possibilities, but he cannot consider TKA because he recently had an injection.  In addition, he has some medical issues that may prevent him from having surgery at [facility].  Will discuss with Dr. Aline Brochure with possible referral for further discussion regarding TKA.  In the meantime, he would like to try some PT.    Follow-up: Return if symptoms worsen or fail to improve.  Subjective:  Chief Complaint  Patient presents with   Knee Pain    Follow up after injection Pt states knee may be about 30% better. Had prednisone at home and started taking that on 5/20 Takes on average about '2400mg'$  of Ibuprofen a day    History of Present Illness: [name] [name] is a 81 y.o. male who returns for evaluation of right knee pain.  He received a right knee steroid injection a few weeks ago.  He reports some improvements in pain.  Approximately 30% better.  He was taking some Prednisone he had at home, but this did not help much.  He is not tapering off the prednisone.  He has been taking prescription strength ibuprofen on a regular basis, but does not want to continue.  Review of Systems: No fevers or chills No numbness or tingling No chest pain No shortness of breath No bowel or bladder dysfunction No GI distress No headaches   Objective: There were no vitals taken for this visit.  Physical Exam:  General: Elderly male., Alert and oriented., and No acute distress. Gait: Right sided antalgic gait.  Right knee with mild varus alignment overall.  Tenderness to palpation over the medial joint line.  No effusion.  Range of motion from 3-120  degrees without discomfort.  No increased laxity varus or valgus stress.  Negative Lachman.  Toes are warm and well-perfused.  IMAGING: I personally ordered and reviewed the following images  X-rays of the right knee were obtained in clinic today.  He has a varus alignment overall.  No acute injuries are noted.  Complete loss of joint space within the medial compartment.  There are associated osteophytes.  Loss of joint space within the patellofemoral compartment.  Impression: Moderate to severe right knee arthritis with varus alignment.  New Medications:  Meds ordered this encounter  Medications   traMADol (ULTRAM) 50 MG tablet    Sig: Take 1 tablet (50 mg total) by mouth every 6 (six) hours as needed.    Dispense:  15 tablet    Refill:  0      Mordecai Rasmussen, MD  10/09/2021 10:42 PM

## 2021-10-14 ENCOUNTER — Telehealth: Payer: Self-pay

## 2021-10-14 ENCOUNTER — Other Ambulatory Visit (HOSPITAL_COMMUNITY): Payer: Self-pay

## 2021-10-14 NOTE — Telephone Encounter (Signed)
Patient Advocate Encounter   Received notification via fax that prior authorization for Albuterol Sulfate 0.083% Nebulizer Solution is required. Per patient's plan. This medication is covered under the patient's Medicare Part B coverage. Please send to a DME supplier.

## 2021-10-15 NOTE — Progress Notes (Signed)
Patient ID: [name] [name], male   DOB: 07/19/1940, 81 y.o.   MRN: [id number]      81 y.o. history of AAA, CAD pancreatitis PCI RCA 1999 with low risk myovue 2018. Clinically stable Intolerant to beta blockers with diarrhea . AAA 4.6 cm by [place] 05/12/20 but now 5.1 cm on US/CT July 2022 Has seen Dr Donnetta Hutching ? Need for EVAR   Wife passed a few  years ago of Pancreatic Cancer and only lasted 6 weeks form diagnosis Has daughter in [place]    02/11/20 had recurrent right posterior epistaxis requiring endoscopic control and cauterization   Had right hernia surgery with Dr Arnoldo Morale 03/28/20  Still with some pain/swelling  Had idiopathic pancreatitis this year Not a drinker has had GB removed Seen by Dr Paulita Fujita no etiology noted  No angina or cardiac complaints Former smoker quit in 2014 Doing cardiopulmonary rehab 2x/ week Has seen Dr Melvyn Novas in past CXR 05/26/20 NAD With Emphysema   He is active with some dyspnea from COPD Age and pulmonary status make him a poor candidate for open AAA repair AAA 5.1 cm on CTA 06/11/21 stable Has seen Dr Donnetta Hutching 06/2021 indicated stability and observation as plan with repeat imaging in 6 months   Seeing Dr Amedeo Kinsman for right knee arthritis  Sees [facility] for pulmonary on Advair   No abdominal pain or issues with AAA    ROS: Denies fever, malais, weight loss, blurry vision, decreased visual acuity, cough, sputum, SOB, hemoptysis, pleuritic pain, palpitaitons, heartburn, abdominal pain, melena, lower extremity edema, claudication, or rash.  All other systems reviewed and negative  General: BP 118/76   Pulse 74   Ht '6\' 1"'$  (1.854 m)   Wt 204 lb (92.5 kg)   SpO2 99%   BMI 26.91 kg/m   Affect appropriate Healthy:  appears stated age 51: hard of hearing Aides in place  Neck supple with no adenopathy JVP normal no bruits no thyromegaly Lungs Mild exp wheezing and good diaphragmatic motion Heart:  S1/S2 no murmur, no rub, gallop or click PMI normal Abdomen: benighn, BS  positve, no tenderness, AAA palpable not tender  no bruit.  No HSM or HJR Post right hernia surgery repair but persistent  Umbilical hernia  Distal pulses intact with no bruits No edema Neuro non-focal Skin warm and dry No muscular weakness   Current Outpatient Medications  Medication Sig Dispense Refill   albuterol (PROVENTIL) (2.5 MG/3ML) 0.083% nebulizer solution Take 3 mLs (2.5 mg total) by nebulization every 6 (six) hours as needed for wheezing or shortness of breath. 100 mL 5   albuterol (VENTOLIN HFA) 108 (90 Base) MCG/ACT inhaler 2 puffs as needed     alfuzosin (UROXATRAL) 10 MG 24 hr tablet Take 1 tablet (10 mg total) by mouth daily with breakfast. 90 tablet 3   azithromycin (ZITHROMAX) 250 MG tablet Take 1 tablet (250 mg total) by mouth daily. 2 tablets on day one and daily for next four days 6 tablet 0   cetirizine (ZYRTEC) 10 MG tablet Take 10 mg by mouth daily.     COMBIGAN 0.2-0.5 % ophthalmic solution Apply 1 drop to eye 2 (two) times daily.     desonide (DESOWEN) 0.05 % cream Apply topically 2 (two) times daily.     esomeprazole (NEXIUM) 40 MG capsule Take 40 mg by mouth daily.     fluticasone-salmeterol (ADVAIR HFA) 230-21 MCG/ACT inhaler Inhale 2 puffs into the lungs 2 (two) times daily. 12 g 5   hydroxypropyl  methylcellulose / hypromellose (ISOPTO TEARS / GONIOVISC) 2.5 % ophthalmic solution Place 1 drop into both eyes 3 (three) times daily as needed for dry eyes.     LORazepam (ATIVAN) 0.5 MG tablet TAKE 1 TABLET BY MOUTH ONCE DAILY AT BEDTIME. 30 tablet 3   losartan (COZAAR) 25 MG tablet TAKE ONE TABLET BY MOUTH ONCE DAILY. 90 tablet 1   lovastatin (MEVACOR) 40 MG tablet TAKE 1 TABLET BY MOUTH DAILY. 90 tablet 0   metroNIDAZOLE (METROGEL) 0.75 % gel Apply topically.     montelukast (SINGULAIR) 10 MG tablet Take 1 tablet (10 mg total) by mouth at bedtime. 30 tablet 5   nitroGLYCERIN (NITROSTAT) 0.4 MG SL tablet Place 1 tablet (0.4 mg total) under the tongue every 5  (five) minutes as needed for chest pain. 25 tablet 5   oxymetazoline (AFRIN) 0.05 % nasal spray Place 1 spray into both nostrils 2 (two) times daily.     traMADol (ULTRAM) 50 MG tablet Take 1 tablet (50 mg total) by mouth every 6 (six) hours as needed. 15 tablet 0   No current facility-administered medications for this visit.    Allergies  Beta adrenergic blockers, Lasix [furosemide], Cefzil [cefprozil], Ciprofloxacin, Dexamethasone, Gabapentin, Methocarbamol, Neomycin, Penicillins, and Tetracyclines & related  Electrocardiogram:  04/07/18  SR PAC;s otherwise normal   Assessment and Plan  CAD:  Distant PCI RCA 1999. Low risk myovue April 2018 Clinically stable Intolerant to beta blockers continue medical Rx  AAA: 5.1 cm by CTA 06/10/21 with thrombus burden fu Dr early    HTN:  Well controlled.  Continue current medications and low sodium Dash type diet.    GERD:  Continue carafate and nexium f/u GI  Asthma:  On inhalers no active wheezing   PAC/SVT:  Stable unable to take beta blockers  Anxiety/Depression:   Has not started on SSRI   ENT:  ARB held hearing worse using Aides no further epistaxis   HLD:  On mevacor 40 mg daily LDL 67 at goal   Hernia:  F/u Arnoldo Morale post surgery 2021 improved     F/U [facility] January F/U with me in a year   Jenkins Rouge

## 2021-10-15 NOTE — Telephone Encounter (Signed)
I left a message for the patient to call back to verify where he would like the medication sent from a medical supply company.

## 2021-10-20 NOTE — Telephone Encounter (Signed)
Closing encounter. Patient will call when he is out of his medication.

## 2021-10-21 ENCOUNTER — Ambulatory Visit (INDEPENDENT_AMBULATORY_CARE_PROVIDER_SITE_OTHER): Payer: Medicare Other | Admitting: Cardiovascular Disease

## 2021-10-21 ENCOUNTER — Encounter: Payer: Self-pay | Admitting: Cardiovascular Disease

## 2021-10-21 ENCOUNTER — Telehealth: Payer: Self-pay | Admitting: Pulmonary Disease

## 2021-10-21 VITALS — BP 118/76 | HR 74 | Ht 73.0 in | Wt 204.0 lb

## 2021-10-21 DIAGNOSIS — E782 Mixed hyperlipidemia: Secondary | ICD-10-CM | POA: Diagnosis not present

## 2021-10-21 DIAGNOSIS — I2510 Atherosclerotic heart disease of native coronary artery without angina pectoris: Secondary | ICD-10-CM

## 2021-10-21 DIAGNOSIS — I10 Essential (primary) hypertension: Secondary | ICD-10-CM | POA: Diagnosis not present

## 2021-10-21 DIAGNOSIS — I714 Abdominal aortic aneurysm, without rupture, unspecified: Secondary | ICD-10-CM | POA: Diagnosis not present

## 2021-10-21 MED ORDER — ALBUTEROL SULFATE (2.5 MG/3ML) 0.083% IN NEBU: 2.5000 mg | INHALATION_SOLUTION | Freq: Four times a day (QID) | RESPIRATORY_TRACT | 5 refills | Status: DC | PRN

## 2021-10-21 NOTE — Telephone Encounter (Signed)
Called patient and informed him that new rx for medication was sent in. Patient verbalized understanding. Nothing further needed

## 2021-10-21 NOTE — Patient Instructions (Signed)
Medication Instructions:  Your physician recommends that you continue on your current medications as directed. Please refer to the Current Medication list given to you today.  *If you need a refill on your cardiac medications before your next appointment, please call your pharmacy*   Lab Work: NONE   If you have labs (blood work) drawn today and your tests are completely normal, you will receive your results only by: MyChart Message (if you have MyChart) OR A paper copy in the mail If you have any lab test that is abnormal or we need to change your treatment, we will call you to review the results.   Testing/Procedures: NONE    Follow-Up: At [facility], you and your health needs are our priority.  As part of our continuing mission to provide you with exceptional heart care, we have created designated Provider Care Teams.  These Care Teams include your primary Cardiologist (physician) and Advanced Practice Providers (APPs -  Physician Assistants and Nurse Practitioners) who all work together to provide you with the care you need, when you need it.  We recommend signing up for the patient portal called "MyChart".  Sign up information is provided on this After Visit Summary.  MyChart is used to connect with patients for Virtual Visits (Telemedicine).  Patients are able to view lab/test results, encounter notes, upcoming appointments, etc.  Non-urgent messages can be sent to your provider as well.   To learn more about what you can do with MyChart, go to [email or web address].    Your next appointment:   6 month(s)  The format for your next appointment:   In Person  Provider:   Peter Nishan, MD    Other Instructions Thank you for choosing [facility]!    Important Information About Sugar       

## 2021-10-22 ENCOUNTER — Ambulatory Visit: Payer: Self-pay | Admitting: Family Medicine

## 2021-10-27 ENCOUNTER — Other Ambulatory Visit: Payer: Self-pay | Admitting: Family Medicine

## 2021-10-27 DIAGNOSIS — G4700 Insomnia, unspecified: Secondary | ICD-10-CM

## 2021-10-28 ENCOUNTER — Ambulatory Visit (INDEPENDENT_AMBULATORY_CARE_PROVIDER_SITE_OTHER): Payer: Medicare Other | Admitting: Family Medicine

## 2021-10-28 ENCOUNTER — Encounter: Payer: Self-pay | Admitting: Family Medicine

## 2021-10-28 DIAGNOSIS — J449 Chronic obstructive pulmonary disease, unspecified: Secondary | ICD-10-CM | POA: Diagnosis not present

## 2021-10-28 DIAGNOSIS — E782 Mixed hyperlipidemia: Secondary | ICD-10-CM

## 2021-10-28 DIAGNOSIS — K219 Gastro-esophageal reflux disease without esophagitis: Secondary | ICD-10-CM | POA: Diagnosis not present

## 2021-10-28 DIAGNOSIS — I714 Abdominal aortic aneurysm, without rupture, unspecified: Secondary | ICD-10-CM | POA: Diagnosis not present

## 2021-10-28 DIAGNOSIS — I2510 Atherosclerotic heart disease of native coronary artery without angina pectoris: Secondary | ICD-10-CM

## 2021-10-28 NOTE — Assessment & Plan Note (Signed)
Stable.  Continue Nexium. 

## 2021-10-28 NOTE — Assessment & Plan Note (Signed)
At goal.  Continue lovastatin.

## 2021-10-28 NOTE — Assessment & Plan Note (Signed)
Stable.  Continue Advair and albuterol.

## 2021-10-28 NOTE — Assessment & Plan Note (Signed)
Follows closely with vascular. 

## 2021-10-28 NOTE — Patient Instructions (Signed)
Continue your meds.  Call with concerns.  Follow up in 6 months.  Take care  Dr. Lacinda Axon

## 2021-10-28 NOTE — Progress Notes (Signed)
Subjective:  Patient ID: [name] [name], male    DOB: 1940-11-21  Age: 81 y.o. MRN: [id number]  CC: Chief Complaint  Patient presents with   Hypertension    Pt states doing well; no issues or concerns at this time    HPI:  81 year old male with the below mentioned past medical history presents for follow-up.  Patient states that he recently mowed the grass and is now having more difficulty with his allergies.  He is otherwise doing well.  Breathing is stable.  He uses Advair and albuterol.  Hypertension is stable on losartan.  Patient follows closely with vascular regarding his AAA.  Patient's LDL has been at goal on lovastatin.  Tolerating well.  GERD stable on Nexium.  Patient Active Problem List   Diagnosis Date Noted   Seasonal allergies 08/06/2021   COPD (chronic obstructive pulmonary disease) ([place]) 04/23/2021   BPH (benign prostatic hyperplasia) 04/23/2021   CAD (coronary artery disease) 04/23/2021   HTN (hypertension) 09/16/2016   Lumbar spondylosis 11/05/2014   GERD (gastroesophageal reflux disease) 01/19/2012   Mixed hyperlipidemia 04/15/2008   AAA (abdominal aortic aneurysm) ([place]) 04/15/2008    Social Hx   Social History   Socioeconomic History   Marital status: Married    Spouse name: Not on file   Number of children: Not on file   Years of education: Not on file   Highest education level: Not on file  Occupational History   Not on file  Tobacco Use   Smoking status: Former    Packs/day: 1.00    Years: 35.00    Total pack years: 35.00    Types: Cigarettes    Quit date: 03/28/2003    Years since quitting: 18.6   Smokeless tobacco: Never  Vaping Use   Vaping Use: Never used  Substance and Sexual Activity   Alcohol use: No    Alcohol/week: 0.0 standard drinks of alcohol   Drug use: No   Sexual activity: Not Currently    Birth control/protection: None  Other Topics Concern   Not on file  Social History Narrative   Not on file   Social  Determinants of Health   Financial Resource Strain: Not on file  Food Insecurity: Not on file  Transportation Needs: Not on file  Physical Activity: Not on file  Stress: Not on file  Social Connections: Not on file    Review of Systems  HENT:  Positive for congestion.   Respiratory:  Positive for shortness of breath.    Objective:  BP 132/74   Pulse 70   Temp 97.6 F (36.4 C)   Wt 205 lb 12.8 oz (93.4 kg)   SpO2 98%   BMI 27.15 kg/m      10/28/2021    9:49 AM 10/21/2021   10:43 AM 09/22/2021    9:00 AM  BP/Weight  Systolic BP 825 053 976  Diastolic BP 74 76 96  Wt. (Lbs) 205.8 204 207  BMI 27.15 kg/m2 26.91 kg/m2 27.31 kg/m2    Physical Exam Vitals and nursing note reviewed.  Constitutional:      General: He is not in acute distress.    Appearance: Normal appearance.  HENT:     Head: Normocephalic and atraumatic.  Eyes:     General:        Right eye: No discharge.        Left eye: No discharge.     Conjunctiva/sclera: Conjunctivae normal.  Cardiovascular:     Rate and  Rhythm: Normal rate and regular rhythm.  Pulmonary:     Effort: Pulmonary effort is normal.     Breath sounds: Normal breath sounds. No wheezing, rhonchi or rales.  Neurological:     Mental Status: He is alert.  Psychiatric:        Mood and Affect: Mood normal.        Behavior: Behavior normal.     Lab Results  Component Value Date   WBC 8.3 08/06/2021   HGB 12.9 (L) 08/06/2021   HCT 39.6 08/06/2021   PLT 265.0 08/06/2021   GLUCOSE 108 (H) 08/25/2021   CHOL 124 04/23/2021   TRIG 109 04/23/2021   HDL 39 (L) 04/23/2021   LDLCALC 65 04/23/2021   ALT 14 08/25/2021   AST 16 08/25/2021   NA 145 (H) 08/25/2021   K 4.4 08/25/2021   CL 103 08/25/2021   CREATININE 0.91 08/25/2021   BUN 12 08/25/2021   CO2 27 08/25/2021   TSH 1.230 02/14/2019   PSA 1.80 07/26/2014   HGBA1C 6.1 (H) 04/23/2021     Assessment & Plan:   Problem List Items Addressed This Visit       Cardiovascular  and Mediastinum   AAA (abdominal aortic aneurysm) ([place])    Follows closely with vascular.      CAD (coronary artery disease)    Stable.  No symptoms currently.        Respiratory   COPD (chronic obstructive pulmonary disease) (H[facility])    Stable.  Continue Advair and albuterol.        Digestive   GERD (gastroesophageal reflux disease)    Stable.  Continue Nexium.        Other   Mixed hyperlipidemia    At goal.  Continue lovastatin.     \ Follow-up:  Return in about 6 months (around 04/29/2022).  [place]

## 2021-10-28 NOTE — Assessment & Plan Note (Signed)
Stable.  No symptoms currently.

## 2021-11-09 ENCOUNTER — Telehealth: Payer: Self-pay | Admitting: Pulmonary Disease

## 2021-11-09 NOTE — Telephone Encounter (Signed)
Called [facility] and spoke with pharmacist. He stated that the pharmacy is receiving a rejection notice because a PA is needed.   PA team, can you all start a PA on his albuterol solution? Thanks!

## 2021-11-10 ENCOUNTER — Ambulatory Visit (HOSPITAL_COMMUNITY): Payer: Medicare Other | Attending: Orthopedic Surgery | Admitting: Physical Therapy

## 2021-11-10 ENCOUNTER — Encounter (HOSPITAL_COMMUNITY): Payer: Self-pay | Admitting: Physical Therapy

## 2021-11-10 ENCOUNTER — Other Ambulatory Visit: Payer: Self-pay | Admitting: Pulmonary Disease

## 2021-11-10 ENCOUNTER — Other Ambulatory Visit (HOSPITAL_COMMUNITY): Payer: Self-pay

## 2021-11-10 DIAGNOSIS — R2689 Other abnormalities of gait and mobility: Secondary | ICD-10-CM

## 2021-11-10 DIAGNOSIS — M1711 Unilateral primary osteoarthritis, right knee: Secondary | ICD-10-CM | POA: Diagnosis not present

## 2021-11-10 DIAGNOSIS — M25561 Pain in right knee: Secondary | ICD-10-CM | POA: Diagnosis not present

## 2021-11-10 DIAGNOSIS — R262 Difficulty in walking, not elsewhere classified: Secondary | ICD-10-CM | POA: Insufficient documentation

## 2021-11-10 DIAGNOSIS — R269 Unspecified abnormalities of gait and mobility: Secondary | ICD-10-CM | POA: Diagnosis not present

## 2021-11-10 DIAGNOSIS — M6281 Muscle weakness (generalized): Secondary | ICD-10-CM | POA: Insufficient documentation

## 2021-11-10 MED ORDER — ALBUTEROL SULFATE (2.5 MG/3ML) 0.083% IN NEBU: 2.5000 mg | INHALATION_SOLUTION | Freq: Four times a day (QID) | RESPIRATORY_TRACT | 5 refills | Status: DC | PRN

## 2021-11-11 NOTE — Telephone Encounter (Signed)
Prior [name] [name] was done for pt's meds since pt last called the office. Nothing further needed.

## 2021-11-12 ENCOUNTER — Ambulatory Visit (HOSPITAL_COMMUNITY): Payer: Medicare Other | Attending: Orthopedic Surgery | Admitting: Physical Therapy

## 2021-11-12 ENCOUNTER — Encounter (HOSPITAL_COMMUNITY): Payer: Self-pay | Admitting: Physical Therapy

## 2021-11-12 DIAGNOSIS — M25561 Pain in right knee: Secondary | ICD-10-CM | POA: Insufficient documentation

## 2021-11-12 DIAGNOSIS — R2689 Other abnormalities of gait and mobility: Secondary | ICD-10-CM | POA: Diagnosis not present

## 2021-11-12 NOTE — Therapy (Signed)
OUTPATIENT PHYSICAL THERAPY TREATMENT NOTE   Patient Name: [name] [name] MRN: [id number] DOB:[birth year], 81 y.o., male Today's Date: 11/12/2021  PCP: Thersa Salt DP REFERRING PROVIDER: Mordecai Rasmussen, MD   END OF SESSION:   PT End of Session - 11/12/21 1350     Visit Number 2    Number of Visits 12    Date for PT Re-Evaluation 12/22/21    Authorization Type Medicare A/ AARP 2ndary    Progress Note Due on Visit 10    PT Start Time 1347    PT Stop Time 1427    PT Time Calculation (min) 40 min    Activity Tolerance Patient tolerated treatment well    Behavior During Therapy WFL for tasks assessed/performed             Past Medical History:  Diagnosis Date   AAA (abdominal aortic aneurysm) ([place])    needs yearly ultrasound   Allergy    Anemia    Arthritis    Asthma    BCC (basal cell carcinoma) 08/18/1989   left shoulder blad, upper right arm, left arm beyond elbow, c&d   BCC (basal cell carcinoma) 01/31/1992   Posterior neck, curetx3, 97f   BCC (basal cell carcinoma) 11/22/2001   mid forehead, cx3, excision, right forearm, cx3, 546f  BCC (basal cell carcinoma) 10/09/2003   mid forehead, MOHs   BCC (basal cell carcinoma) 08/15/2008   upper left back, biopsy   BPH (benign prostatic hyperplasia)    CAD (coronary artery disease)    Cancer (HCC)    skin cancer   COPD (chronic obstructive pulmonary disease) ([facility]   Dysrhythmia    pt. states it can be fast at times   GERD (gastroesophageal reflux disease)    Glaucoma    HOH (hard of hearing)    Hypercholesterolemia    Hypertension    Impaired fasting glucose    Low back pain    Melanoma in situ ([facility] [facility]/25/2005   left chin, MOHs   MI (myocardial infarction) ([facility]   SCC (squamous cell carcinoma) 07/03/2014   in situ, behind left ear, cx3, cautery, 27f9f SCC (squamous cell carcinoma) 07/03/2014   well diff, left forearm, biopsy, cx1, cautery   SCC (squamous cell carcinoma) 07/20/2017   in situ, left upper  arm, cx3, 27fu57fSCC (squamous cell carcinoma) 01/10/2019   in situ, left post shoulder, cx3, 27fu 89fCC (squamous cell carcinoma) 11/22/2001   left forearm distal, left forearm, cx3, 27fu  90fC (squamous cell carcinoma) 10/09/2003   [place], left ear post, clear per st, right cheek clear   SCC (squamous cell carcinoma) 03/30/2004   in situ, left upper arm, cx3, 27fu   41f (squamous cell carcinoma) 03/08/2005   in situ, right cheek, mid upper forehead, cx3, 27fu   S62f(squamous cell carcinoma) 06/08/2006   in situ, left shoulder, cx3, 27fu   SC627fsquamous cell carcinoma) 05/05/2010   right inner wrist, biopsy   SCC (squamous cell carcinoma) 09/13/2013   in situ, right crown scalp, front scalp, biopsy   Thrush    Past Surgical History:  Procedure Laterality Date   BACK SURGERY     x 3   CARDIAC CATHETERIZATION     angioplasty   CATARACT EXTRACTION W/PHACO  03/20/2012   Procedure: CATARACT EXTRACTION PHACO AND INTRAOCULAR LENS PLACEMENT (IOC);  SuDouglason: Carroll FWilliams Checation: [facility];  Service: Ophthalmology;  Laterality: Right;  CDE:  8.45   CATARACT EXTRACTION W/PHACO Left 04/02/2013   Procedure: CATARACT EXTRACTION PHACO AND INTRAOCULAR LENS PLACEMENT (IOC);  Surgeon: Williams Che, MD;  Location: [facility];  Service: Ophthalmology;  Laterality: Left;  CDE:  6.50   CHOLECYSTECTOMY  2000   COLONOSCOPY  2009   repeat 5 years   ESOPHAGOGASTRODUODENOSCOPY     HERNIA REPAIR Left    inguinal   INGUINAL HERNIA REPAIR Right 03/28/2020   Procedure: Right Inguinal Herniorrhaphy with Mesh;  Surgeon: Aviva Signs, MD;  Location: [facility];  Service: General;  Laterality: Right;   LAPAROSCOPIC PARTIAL COLECTOMY N/A 06/11/2013   Procedure: LAPAROSCOPIC HAND ASSISTED PARTIAL COLECTOMY;  Surgeon: Jamesetta So, MD;  Location: [facility];  Service: General;  Laterality: N/A;   NASAL ENDOSCOPY WITH EPISTAXIS CONTROL Bilateral 02/11/2020   Procedure: NASAL ENDOSCOPY WITH EPISTAXIS CONTROL;  Surgeon:  Leta Baptist, MD;  Location: [place];  Service: ENT;  Laterality: Bilateral;   right eye detached retina Bilateral    SPINAL FUSION  6962   YAG LASER APPLICATION Left 95/28/4132   Procedure: YAG LASER APPLICATION;  Surgeon: Williams Che, MD;  Location: [facility];  Service: Ophthalmology;  Laterality: Left;   Patient Active Problem List   Diagnosis Date Noted   Seasonal allergies 08/06/2021   COPD (chronic obstructive pulmonary disease) ([place]) 04/23/2021   BPH (benign prostatic hyperplasia) 04/23/2021   CAD (coronary artery disease) 04/23/2021   HTN (hypertension) 09/16/2016   Lumbar spondylosis 11/05/2014   GERD (gastroesophageal reflux disease) 01/19/2012   Mixed hyperlipidemia 04/15/2008   AAA (abdominal aortic aneurysm) ([place]) 04/15/2008    REFERRING DIAG: M17.11 (ICD-10-CM) - Arthritis of right knee   THERAPY DIAG:  Right knee pain, unspecified chronicity  Other abnormalities of gait and mobility  Rationale for Evaluation and Treatment Rehabilitation  PERTINENT HISTORY: Arthritis, AAA, LBP   PRECAUTIONS: None   SUBJECTIVE: Patient reports knee is sore today. Reports compliance with HEP, but makes knee sore. Has not taken anything for pain so far today.   PAIN:  Are you having pain? Yes: NPRS scale: 7/10 Pain location: Medial and anterior RT knee joint  Pain description: sharp, aching, burning Aggravating factors: WB, movement, prolonged stillness Relieving factors: asper cream, rest   OBJECTIVE:    DIAGNOSTIC FINDINGS: Impression: Moderate to severe right knee arthritis with varus alignment.    PATIENT SURVEYS:  FOTO 47% function    COGNITION:           Overall cognitive status: Within functional limits for tasks assessed                          SENSATION: WFL   EDEMA:  Minimal      LOWER EXTREMITY ROM:   Active ROM Right eval Left eval  Hip flexion      Hip extension      Hip abduction      Hip adduction      Hip internal rotation       Hip external rotation      Knee flexion 116 130  Knee extension -8 0  Ankle dorsiflexion      Ankle plantarflexion      Ankle inversion      Ankle eversion       (Blank rows = not tested)   LOWER EXTREMITY MMT:   MMT Right eval Left eval  Hip flexion 4 5  Hip extension 4 4+  Hip abduction  4 4+  Hip adduction      Hip internal rotation      Hip external rotation      Knee flexion      Knee extension 4 5  Ankle dorsiflexion 5 5  Ankle plantarflexion      Ankle inversion      Ankle eversion       (Blank rows = not tested)     GAIT: Antalgic, decreased stride on RT, no AD        TODAY'S TREATMENT: 11/12/21 Goal review Manual: RT patellar mobs grade II in all planes for pain   Supine:  Quad set 15 x 5"  SLR 2 x 10 Supine hamstring isometric 10 x 5"  Sidleying hip abduction 2 x10   Standing HR x 15  Standing TR x15  11/10/21 Eval Quad set SLR      PATIENT EDUCATION:  Education details: on eval findings, POC and HEP  Person educated: Patient Education method: Explanation Education comprehension: verbalized understanding     HOME EXERCISE PROGRAM: Access Code: [email or web address] URL: [email or web address] Date: 11/12/2021 Prepared by: Josue Hector  Exercises - Sidelying Hip Abduction  - 1-2 x daily - 7 x weekly - 2 sets - 10 reps - Heel Raises with Counter Support  - 1-2 x daily - 7 x weekly - 2 sets - 10 reps - Supine Isometric Hamstring Set  - 1-2 x daily - 7 x weekly - 1 sets - 10 reps - 5 second hold - Toe Raise With Back Against Wall  - 1-2 x daily - 7 x weekly - 1 sets - 10 reps  Access Code: [email or web address] URL: [email or web address] Date: 11/10/2021 Prepared by: Josue Hector   Exercises - Supine Quad Set  - 2-3 x daily - 7 x weekly - 2 sets - 10 reps - 5 second hold - Active Straight Leg Raise with Quad Set  - 2-3 x daily - 7 x weekly - 2 sets - 10 reps   ASSESSMENT:   CLINICAL IMPRESSION: Reviewed therapy goals  and HEP. Patient noting increased knee pain during treatment. Difficulty performing quad set and SLR. Cued patient to perform at 40% of max volition for decreased symptoms. Also performed patellar mobs in attempt to reduce pain symptoms. Progressed home HEP and issued printout. Patient will continue to benefit from skilled therapy services to reduce remaining deficits and improve functional ability.     OBJECTIVE IMPAIRMENTS Abnormal gait, decreased balance, decreased mobility, difficulty walking, decreased ROM, decreased strength, increased fascial restrictions, impaired flexibility, improper body mechanics, and pain.    ACTIVITY LIMITATIONS bending, standing, squatting, stairs, transfers, and locomotion level   PARTICIPATION LIMITATIONS: meal prep, cleaning, driving, shopping, community activity, and yard work   [place] Age and Time since onset of injury/illness/exacerbation are also affecting patient's functional outcome.    REHAB POTENTIAL: Good   CLINICAL DECISION MAKING: Stable/uncomplicated   EVALUATION COMPLEXITY: Low     GOALS: SHORT TERM GOALS: Target date: 11/24/2021   Patient will be independent with initial HEP and self-management strategies to improve functional outcomes Baseline:  Goal status: INITIAL    LONG TERM GOALS: Target date: 12/22/2021   Patient will be independent with advanced HEP and self-management strategies to improve functional outcomes Baseline:  Goal status: INITIAL   2.  Patient will improve FOTO score to predicted value to indicate improvement in functional outcomes Baseline: 47% function  Goal status: INITIAL   3.  Patient will have RT knee AROM -5 to  120 degrees to improve functional mobility and facilitate squatting to pick up items from floor. Baseline: See ROM Goal status: INITIAL   4. Patient will have equal to or > 4+/5 MMT throughout BLE to improve ability to perform functional mobility, stair ambulation and ADLs.  Baseline: See  MMT Goal status: INITIAL   5. Patient will be able to ambulate at least 325 feet during 2MWT with LRAD to demonstrate improved ability to perform functional mobility and associated tasks. Baseline:  Goal status: INITIAL       PLAN: PT FREQUENCY: 1-2x/week   PT DURATION: 6 weeks   PLANNED INTERVENTIONS: Therapeutic exercises, Therapeutic activity, Neuromuscular re-education, Balance training, Gait training, Patient/Family education, Joint manipulation, Joint mobilization, Stair training, Aquatic Therapy, Dry Needling, Electrical stimulation, Spinal manipulation, Spinal mobilization, Cryotherapy, Moist heat, scar mobilization, Taping, Traction, Ultrasound, Biofeedback, Ionotophoresis '4mg'$ /ml Dexamethasone, and Manual therapy.       PLAN FOR NEXT SESSION: Progress glute and quad strength as tolerated. Improve knee mobility as able.    2:27 PM, 11/12/21 Josue Hector PT DPT  Physical Therapist with [facility]  [phone]

## 2021-11-13 ENCOUNTER — Telehealth: Payer: Self-pay | Admitting: Pulmonary Disease

## 2021-11-13 DIAGNOSIS — J449 Chronic obstructive pulmonary disease, unspecified: Secondary | ICD-10-CM

## 2021-11-13 NOTE — Telephone Encounter (Signed)
Neb kit was sent to DME for pt. Rx for pt's Albuterol sol was sent to pharmacy for pt 6/27. Attempted to call pt to let him know this info but unable to reach. Per DPR, it is okay to leave detailed message so detailed message was left for pt letting him know the above info about neb sol and neb kit. Nothing further needed.

## 2021-11-16 ENCOUNTER — Telehealth: Payer: Self-pay | Admitting: Pulmonary Disease

## 2021-11-16 ENCOUNTER — Telehealth (HOSPITAL_COMMUNITY): Payer: Self-pay

## 2021-11-16 ENCOUNTER — Ambulatory Visit (HOSPITAL_COMMUNITY): Payer: Medicare Other

## 2021-11-16 ENCOUNTER — Other Ambulatory Visit: Payer: Self-pay | Admitting: Urology

## 2021-11-16 MED ORDER — ALBUTEROL SULFATE (2.5 MG/3ML) 0.083% IN NEBU: 2.5000 mg | INHALATION_SOLUTION | Freq: Four times a day (QID) | RESPIRATORY_TRACT | 5 refills | Status: DC | PRN

## 2021-11-16 NOTE — Telephone Encounter (Signed)
Called and spoke with patient. He stated that [facility] received the updated prescription from 11/10/21 but it was for a 14 day supply. He would like to have a 30 day supply [facility] to [facility]. I advised him that I would go ahead and correct the prescription and cancel the 14 day prescription. He verbalized understanding and appreciation.   Nothing further needed at time of call.

## 2021-11-16 NOTE — Telephone Encounter (Signed)
Patient's knee is inflamed and swallon he doesn't want to come to PT today.

## 2021-11-18 ENCOUNTER — Other Ambulatory Visit (HOSPITAL_COMMUNITY): Payer: Self-pay

## 2021-11-18 ENCOUNTER — Encounter (HOSPITAL_COMMUNITY): Payer: Medicare Other | Admitting: Physical Therapy

## 2021-11-20 ENCOUNTER — Other Ambulatory Visit (HOSPITAL_COMMUNITY): Payer: Self-pay

## 2021-11-20 ENCOUNTER — Encounter (HOSPITAL_COMMUNITY): Payer: Medicare Other

## 2021-11-23 ENCOUNTER — Encounter (HOSPITAL_COMMUNITY): Payer: Medicare Other | Admitting: Physical Therapy

## 2021-11-25 ENCOUNTER — Telehealth: Payer: Self-pay | Admitting: Orthopedic Surgery

## 2021-11-25 ENCOUNTER — Ambulatory Visit (INDEPENDENT_AMBULATORY_CARE_PROVIDER_SITE_OTHER): Payer: Medicare Other | Admitting: Orthopedic Surgery

## 2021-11-25 ENCOUNTER — Encounter: Payer: Self-pay | Admitting: Orthopedic Surgery

## 2021-11-25 VITALS — Ht 73.0 in | Wt 205.0 lb

## 2021-11-25 DIAGNOSIS — M1711 Unilateral primary osteoarthritis, right knee: Secondary | ICD-10-CM | POA: Diagnosis not present

## 2021-11-25 MED ORDER — TRAMADOL HCL 50 MG PO TABS: 50.0000 mg | ORAL_TABLET | Freq: Four times a day (QID) | ORAL | 0 refills | Status: DC | PRN

## 2021-11-25 NOTE — Telephone Encounter (Signed)
I called [name] and discussed with him the potential for having knee replacement  He does have COPD he does have an aortic aneurysm scheduled for ultrasound in August  We discussed hyaluronic acid injection.  Any of the providers in the office can give the injection as soon as we get approval.  I think he is a decent candidate for injection based on his medical history.  His disease is grade 3 disease so he may or may not get good results from the injection with hyaluronic acid

## 2021-11-25 NOTE — Progress Notes (Signed)
Return Patient Visit  Assessment: [name] [name] is a 81 y.o. male with the following: 1. Arthritis of right knee  Plan: [name] [name] has advanced degenerative changes in the right knee.  He has had an injection within the last 2 months, with limited improvement in his symptoms.  Medications are not providing sufficient relief.  He remains adamant against proceeding with knee replacement at this time.  We discussed the plan to proceed with hyaluronic acid injections, and we will get prior authorization.  In addition, I would like for him to meet with Dr. Aline Brochure so that he can discuss total knee replacement in full detail.  I provided him with a refill for tramadol.  Otherwise medications as needed.  Once we have authorization, we will work to coordinate a visit for the HA injection.  Follow-up: Return for After authorization for HA injection .  Subjective:  Chief Complaint  Patient presents with   Knee Pain    Rt knee, wants to discuss options     History of Present Illness: [name] [name] is a 81 y.o. male who returns for evaluation of right knee pain.  He has had limited improvement in his right knee pain following injection.  He has been taking tramadol more consistently, with limited improvement in his symptoms.  He is also taking ibuprofen.  He does note that the ibuprofen is upsetting his stomach.  He did not tolerate physical therapy, feels as though it made it worse.  He is aware that he has bad arthritis, but would like to avoid surgery if possible.  . Review of Systems: No fevers or chills No numbness or tingling No chest pain No shortness of breath No bowel or bladder dysfunction No GI distress No headaches   Objective: Ht '6\' 1"'$  (1.854 m)   Wt 205 lb (93 kg)   BMI 27.05 kg/m   Physical Exam:  General: Elderly male., Alert and oriented., and No acute distress. Gait: Right sided antalgic gait.  Right knee with mild varus alignment overall.  Tenderness to  palpation over the medial joint line.  No effusion.  Range of motion from 3-120 degrees without discomfort.  No increased laxity varus or valgus stress.  Negative Lachman.  Toes are warm and well-perfused.  IMAGING: I personally ordered and reviewed the following images  No new imaging obtained today.  New Medications:  Meds ordered this encounter  Medications   traMADol (ULTRAM) 50 MG tablet    Sig: Take 1 tablet (50 mg total) by mouth every 6 (six) hours as needed.    Dispense:  30 tablet    Refill:  0      Mordecai Rasmussen, MD  11/25/2021 3:18 PM

## 2021-11-25 NOTE — Patient Instructions (Signed)
Work on authorization for HA injection  Will have him see Dr. Aline Brochure to discuss TKA, and possibly get HA injection

## 2021-11-26 ENCOUNTER — Encounter (HOSPITAL_COMMUNITY): Payer: Medicare Other | Admitting: Physical Therapy

## 2021-11-27 ENCOUNTER — Other Ambulatory Visit: Payer: Self-pay | Admitting: Orthopedic Surgery

## 2021-11-27 ENCOUNTER — Encounter (HOSPITAL_COMMUNITY): Payer: Medicare Other

## 2021-11-27 DIAGNOSIS — M1711 Unilateral primary osteoarthritis, right knee: Secondary | ICD-10-CM

## 2021-11-30 ENCOUNTER — Telehealth: Payer: Self-pay

## 2021-11-30 ENCOUNTER — Encounter (HOSPITAL_COMMUNITY): Payer: Medicare Other | Admitting: Physical Therapy

## 2021-11-30 NOTE — Telephone Encounter (Signed)
Pt called stating that he was being followed by Dr Early for an AAA and had an upcoming [place] on 12/30/21. He has since developed a knee problem and [facility] recommends a gel injection for pain. They told him that if it doesn't work, it would delay knee surgery for 3 months. Pt asked if the [place] in August indicated a need for surgery, would the gel injection in the knee affect the timing of that possible surgery.  Spoke with PA, reviewed pt's chart, returned pt's call, two identifiers used. Informed pt that the provider confirmed that the gel injection would not delay any vascular surgery, if needed. Confirmed understanding.

## 2021-12-02 ENCOUNTER — Telehealth: Payer: Self-pay | Admitting: Pulmonary Disease

## 2021-12-02 ENCOUNTER — Other Ambulatory Visit: Payer: Self-pay | Admitting: Pulmonary Disease

## 2021-12-02 ENCOUNTER — Encounter (HOSPITAL_COMMUNITY): Payer: Medicare Other | Admitting: Physical Therapy

## 2021-12-02 MED ORDER — AZITHROMYCIN 250 MG PO TABS: 250.0000 mg | ORAL_TABLET | Freq: Every day | ORAL | 0 refills | Status: DC

## 2021-12-02 NOTE — Telephone Encounter (Signed)
Called patient and he states that he is having breathing issues. Patient states he is taking mucinex but for the past few days he has been coughing up dark mucus. No fever noted. Pt states that voice is going in and out due to the coughing. But he would like a zpak if that's ok with Dr Jenetta Downer.  Dr Jenetta Downer please advise sir

## 2021-12-02 NOTE — Progress Notes (Signed)
Prescription for azithromycin sent into pharmacy

## 2021-12-02 NOTE — Telephone Encounter (Signed)
Called and spoke to patient and notified him that medication was sent in. Nothing further needed

## 2021-12-02 NOTE — Telephone Encounter (Signed)
Azithromycin sent in

## 2021-12-03 DIAGNOSIS — H3581 Retinal edema: Secondary | ICD-10-CM | POA: Diagnosis not present

## 2021-12-03 DIAGNOSIS — Z9889 Other specified postprocedural states: Secondary | ICD-10-CM | POA: Diagnosis not present

## 2021-12-03 DIAGNOSIS — H1045 Other chronic allergic conjunctivitis: Secondary | ICD-10-CM | POA: Diagnosis not present

## 2021-12-03 DIAGNOSIS — H02132 Senile ectropion of right lower eyelid: Secondary | ICD-10-CM | POA: Diagnosis not present

## 2021-12-03 DIAGNOSIS — H01134 Eczematous dermatitis of left upper eyelid: Secondary | ICD-10-CM | POA: Diagnosis not present

## 2021-12-03 DIAGNOSIS — H401122 Primary open-angle glaucoma, left eye, moderate stage: Secondary | ICD-10-CM | POA: Diagnosis not present

## 2021-12-03 DIAGNOSIS — H04123 Dry eye syndrome of bilateral lacrimal glands: Secondary | ICD-10-CM | POA: Diagnosis not present

## 2021-12-03 DIAGNOSIS — H43392 Other vitreous opacities, left eye: Secondary | ICD-10-CM | POA: Diagnosis not present

## 2021-12-03 DIAGNOSIS — Z961 Presence of intraocular lens: Secondary | ICD-10-CM | POA: Diagnosis not present

## 2021-12-03 DIAGNOSIS — H01135 Eczematous dermatitis of left lower eyelid: Secondary | ICD-10-CM | POA: Diagnosis not present

## 2021-12-03 DIAGNOSIS — H0102A Squamous blepharitis right eye, upper and lower eyelids: Secondary | ICD-10-CM | POA: Diagnosis not present

## 2021-12-03 DIAGNOSIS — H0102B Squamous blepharitis left eye, upper and lower eyelids: Secondary | ICD-10-CM | POA: Diagnosis not present

## 2021-12-04 ENCOUNTER — Encounter (HOSPITAL_COMMUNITY): Payer: Medicare Other

## 2021-12-04 ENCOUNTER — Encounter: Payer: Self-pay | Admitting: Family Medicine

## 2021-12-04 ENCOUNTER — Ambulatory Visit (INDEPENDENT_AMBULATORY_CARE_PROVIDER_SITE_OTHER): Payer: Medicare Other | Admitting: Family Medicine

## 2021-12-04 DIAGNOSIS — M1711 Unilateral primary osteoarthritis, right knee: Secondary | ICD-10-CM

## 2021-12-04 DIAGNOSIS — I2510 Atherosclerotic heart disease of native coronary artery without angina pectoris: Secondary | ICD-10-CM

## 2021-12-04 DIAGNOSIS — R109 Unspecified abdominal pain: Secondary | ICD-10-CM

## 2021-12-04 MED ORDER — HYDROCODONE-ACETAMINOPHEN 5-325 MG PO TABS: 1.0000 | ORAL_TABLET | Freq: Three times a day (TID) | ORAL | 0 refills | Status: DC | PRN

## 2021-12-04 MED ORDER — AZITHROMYCIN 250 MG PO TABS: 250.0000 mg | ORAL_TABLET | Freq: Every day | ORAL | 0 refills | Status: DC

## 2021-12-04 NOTE — Patient Instructions (Signed)
Stop the tramadol.  Use the ibuprofen sparingly.  Pain medication as prescribed.  Take care  Dr. Lacinda Axon

## 2021-12-06 DIAGNOSIS — R109 Unspecified abdominal pain: Secondary | ICD-10-CM | POA: Insufficient documentation

## 2021-12-06 DIAGNOSIS — M1711 Unilateral primary osteoarthritis, right knee: Secondary | ICD-10-CM | POA: Insufficient documentation

## 2021-12-06 NOTE — Progress Notes (Signed)
Subjective:  Patient ID: [name] [name], male    DOB: July 30, 1940  Age: 81 y.o. MRN: [id number]  CC: Chief Complaint  Patient presents with   Abdominal Pain    Pt having intermittent pain and burning in right side abdomen. Pt states 3 nights it was burning in a line. Other nights it was like a cramp. If he rolls to either side it goes away. Pt has been taking Tramadol for the past 8 night along with 600 mg Ibu.     HPI:  81 year old male with CAD, AAA (Currently 5.1 cm in diameter), COPD, HTN, GERD presents for evaluation of the above.   Patient has severe OA of the right knee. Recently started on Tramadol for the pain. Patient reports symptoms over the past week. Reports right sided abdominal pain. Located in a line extending from the RUQ downward. Describes it as a burning sensation. Associated nausea. He has been taking Ibuprofen regularly as well.  He believes he is experiencing a medication side effect. No fever. No reports of vomiting, bloody stool.   Of note, patient is status post cholecystectomy.   Patient Active Problem List   Diagnosis Date Noted   Right sided abdominal pain 12/06/2021   Osteoarthritis of right knee 12/06/2021   Seasonal allergies 08/06/2021   COPD (chronic obstructive pulmonary disease) ([place]) 04/23/2021   BPH (benign prostatic hyperplasia) 04/23/2021   CAD (coronary artery disease) 04/23/2021   HTN (hypertension) 09/16/2016   Lumbar spondylosis 11/05/2014   GERD (gastroesophageal reflux disease) 01/19/2012   Mixed hyperlipidemia 04/15/2008   AAA (abdominal aortic aneurysm) ([place]) 04/15/2008    Social Hx   Social History   Socioeconomic History   Marital status: Married    Spouse name: Not on file   Number of children: Not on file   Years of education: Not on file   Highest education level: Not on file  Occupational History   Not on file  Tobacco Use   Smoking status: Former    Packs/day: 1.00    Years: 35.00    Total pack years: 35.00     Types: Cigarettes    Quit date: 03/28/2003    Years since quitting: 18.7   Smokeless tobacco: Never  Vaping Use   Vaping Use: Never used  Substance and Sexual Activity   Alcohol use: No    Alcohol/week: 0.0 standard drinks of alcohol   Drug use: No   Sexual activity: Not Currently    Birth control/protection: None  Other Topics Concern   Not on file  Social History Narrative   Not on file   Social Determinants of Health   Financial Resource Strain: Not on file  Food Insecurity: Not on file  Transportation Needs: Not on file  Physical Activity: Not on file  Stress: Not on file  Social Connections: Not on file    Review of Systems Per HPI  Objective:  BP 127/79   Pulse 69   Temp (!) 97.4 F (36.3 C)   Wt 203 lb 3.2 oz (92.2 kg)   SpO2 96%   BMI 26.81 kg/m      12/04/2021   11:04 AM 11/25/2021   11:36 AM 10/28/2021    9:49 AM  BP/Weight  Systolic BP 518  841  Diastolic BP 79  74  Wt. (Lbs) 203.2 205 205.8  BMI 26.81 kg/m2 27.05 kg/m2 27.15 kg/m2    Physical Exam Constitutional:      General: He is not in acute distress.  Appearance: He is well-developed. He is not ill-appearing.  HENT:     Head: Normocephalic and atraumatic.  Cardiovascular:     Rate and Rhythm: Normal rate and regular rhythm.  Pulmonary:     Effort: Pulmonary effort is normal.     Breath sounds: Normal breath sounds. No wheezing, rhonchi or rales.  Abdominal:     General: There is no distension.     Palpations: Abdomen is soft.     Tenderness: There is no abdominal tenderness.  Neurological:     Mental Status: He is alert.  Psychiatric:        Mood and Affect: Mood normal.        Behavior: Behavior normal.     Lab Results  Component Value Date   WBC 8.3 08/06/2021   HGB 12.9 (L) 08/06/2021   HCT 39.6 08/06/2021   PLT 265.0 08/06/2021   GLUCOSE 108 (H) 08/25/2021   CHOL 124 04/23/2021   TRIG 109 04/23/2021   HDL 39 (L) 04/23/2021   LDLCALC 65 04/23/2021   ALT 14  08/25/2021   AST 16 08/25/2021   NA 145 (H) 08/25/2021   K 4.4 08/25/2021   CL 103 08/25/2021   CREATININE 0.91 08/25/2021   BUN 12 08/25/2021   CO2 27 08/25/2021   TSH 1.230 02/14/2019   PSA 1.80 07/26/2014   HGBA1C 6.1 (H) 04/23/2021     Assessment & Plan:   Problem List Items Addressed This Visit       Musculoskeletal and Integument   Osteoarthritis of right knee    Tramadol stop.  PRN Hydrocodone. Will likely need knee replacement. Concern regarding his surgical risk given AAA and COPD.      Relevant Medications   HYDROcodone-acetaminophen (NORCO/VICODIN) 5-325 MG tablet     Other   Right sided abdominal pain    Abdominal exam benign. Likely from tramadol and/or ongoing use of NSAID's. Stopping Tramadol. Will monitor closely.       Meds ordered this encounter  Medications   DISCONTD: azithromycin (ZITHROMAX) 250 MG tablet    Sig: Take 1 tablet (250 mg total) by mouth daily. Take 2 tablets the first day and then 1 tablet daily for 4 days    Dispense:  6 tablet    Refill:  0   HYDROcodone-acetaminophen (NORCO/VICODIN) 5-325 MG tablet    Sig: Take 1-2 tablets by mouth every 8 (eight) hours as needed for moderate pain or severe pain.    Dispense:  30 tablet    Refill:  0   [place] Controlled substance database reviewed.  [place]

## 2021-12-06 NOTE — Assessment & Plan Note (Signed)
Tramadol stop.  PRN Hydrocodone. Will likely need knee replacement. Concern regarding his surgical risk given AAA and COPD.

## 2021-12-06 NOTE — Assessment & Plan Note (Signed)
Abdominal exam benign. Likely from tramadol and/or ongoing use of NSAID's. Stopping Tramadol. Will monitor closely.

## 2021-12-14 DIAGNOSIS — C44319 Basal cell carcinoma of skin of other parts of face: Secondary | ICD-10-CM | POA: Diagnosis not present

## 2021-12-14 DIAGNOSIS — L720 Epidermal cyst: Secondary | ICD-10-CM | POA: Diagnosis not present

## 2021-12-14 DIAGNOSIS — X32XXXD Exposure to sunlight, subsequent encounter: Secondary | ICD-10-CM | POA: Diagnosis not present

## 2021-12-14 DIAGNOSIS — L570 Actinic keratosis: Secondary | ICD-10-CM | POA: Diagnosis not present

## 2021-12-15 ENCOUNTER — Encounter: Payer: Self-pay | Admitting: Orthopedic Surgery

## 2021-12-15 ENCOUNTER — Ambulatory Visit (INDEPENDENT_AMBULATORY_CARE_PROVIDER_SITE_OTHER): Payer: Medicare Other | Admitting: Orthopedic Surgery

## 2021-12-15 DIAGNOSIS — M1711 Unilateral primary osteoarthritis, right knee: Secondary | ICD-10-CM

## 2021-12-15 NOTE — Patient Instructions (Signed)

## 2021-12-22 ENCOUNTER — Encounter: Payer: Self-pay | Admitting: Orthopaedic Surgery

## 2021-12-22 ENCOUNTER — Ambulatory Visit (INDEPENDENT_AMBULATORY_CARE_PROVIDER_SITE_OTHER): Payer: Medicare Other | Admitting: Orthopaedic Surgery

## 2021-12-22 DIAGNOSIS — M1711 Unilateral primary osteoarthritis, right knee: Secondary | ICD-10-CM

## 2021-12-22 NOTE — Progress Notes (Signed)
This patient is diagnosed with osteoarthritis of the knee(s).    Radiographs show evidence of joint space narrowing, osteophytes, subchondral sclerosis and/or subchondral cysts.  This patient has knee pain which interferes with functional and activities of daily living.    This patient has experienced inadequate response, adverse effects and/or intolerance with conservative treatments such as acetaminophen, NSAIDS, topical creams, physical therapy or regular exercise, knee bracing and/or weight loss.   This patient has experienced inadequate response or has a contraindication to intra articular steroid injections for at least 3 months.   This patient is not scheduled to have a total knee replacement within 6 months of starting treatment with viscosupplementation.  PROCEDURE NOTE:  Orthovisc injection #  2 of 3   Injection 1 vial of Orthovisc into right  knee  There were no vitals taken for this visit.  The right knee exam: there was no synovitis or infection   The knee was prepped sterilely  Ethyl chloride was used to anesthetize the skin A 20 g needle was used to inject the knee with 1 vial of Orthovisc A sterile dressing was placed  There were no complications  Follow up one week  Call if any problem.  Precautions discussed.  Electronically Signed Sanjuana Kava, MD 8/8/202310:42 AM

## 2021-12-23 ENCOUNTER — Ambulatory Visit: Payer: Medicare Other | Admitting: Vascular Surgery

## 2021-12-23 ENCOUNTER — Other Ambulatory Visit: Payer: Medicare Other

## 2021-12-25 ENCOUNTER — Telehealth: Payer: Self-pay | Admitting: Pulmonary Disease

## 2021-12-25 MED ORDER — ALBUTEROL SULFATE HFA 108 (90 BASE) MCG/ACT IN AERS: 1.0000 | INHALATION_SPRAY | Freq: Four times a day (QID) | RESPIRATORY_TRACT | 5 refills | Status: DC | PRN

## 2021-12-25 NOTE — Telephone Encounter (Signed)
Patient states needs refill for Albuterol inhaler. Pharmacy is [facility] [place].

## 2021-12-25 NOTE — Telephone Encounter (Signed)
I left a VM for the patient to clarify if he was needing the albuterol nebulizer or the albuterol inhaler. Waiting on a call back.

## 2021-12-25 NOTE — Telephone Encounter (Signed)
Rx for albuterol inhaler has been sent to preferred pharmacy for pt. Called and spoke with pt letting him know this had been done and he verbalized understanding. Nothing further needed.

## 2021-12-29 ENCOUNTER — Ambulatory Visit (INDEPENDENT_AMBULATORY_CARE_PROVIDER_SITE_OTHER): Payer: Medicare Other | Admitting: Orthopedic Surgery

## 2021-12-29 DIAGNOSIS — M1711 Unilateral primary osteoarthritis, right knee: Secondary | ICD-10-CM | POA: Diagnosis not present

## 2021-12-29 NOTE — Progress Notes (Signed)
Return Patient Visit  Assessment: [name] [name] is a 81 y.o. male with the following: 1. Arthritis of right knee  Plan: [name] [name] has advanced degenerative changes in the right knee.  We completed his his third hyaluronic acid injection in clinic today.  No issues.  We will follow-up as needed.  Procedure note injection Right knee joint   Verbal consent was obtained to inject the right knee joint  Timeout was completed to confirm the site of injection.  The skin was prepped with alcohol and ethyl chloride was sprayed at the injection site.  A 21-gauge needle was used to inject Orthovisc hyaluronic acid into the right knee using an anterolateral approach.  There were no complications. A sterile bandage was applied.  Follow-up: Return if symptoms worsen or fail to improve.  Subjective:  Chief Complaint  Patient presents with   Knee Pain    R/ still hurting but not as often and not as much. #3 OrthoVisc injection    History of Present Illness: [name] [name] is a 81 y.o. male who returns for evaluation of right knee pain.  He notes some improvement in his symptoms, compared to prior to his HA injections.  No issues with the injection last week with Dr. Luna Glasgow.  . Review of Systems: No fevers or chills No numbness or tingling No chest pain No shortness of breath No bowel or bladder dysfunction No GI distress No headaches   Objective: There were no vitals taken for this visit.  Physical Exam:  General: Elderly male., Alert and oriented., and No acute distress. Gait: Right sided antalgic gait.  Right knee with mild varus alignment overall.  Tenderness to palpation over the medial joint line.  No effusion.  Range of motion from 3-120 degrees without discomfort.  No increased laxity varus or valgus stress.  Negative Lachman.  Toes are warm and well-perfused.  IMAGING: I personally ordered and reviewed the following images  No new imaging obtained today.  New  Medications:  No orders of the defined types were placed in this encounter.     Mordecai Rasmussen, MD  12/29/2021 11:54 AM

## 2021-12-30 ENCOUNTER — Encounter (HOSPITAL_COMMUNITY): Payer: Self-pay

## 2021-12-30 ENCOUNTER — Encounter: Payer: Self-pay | Admitting: Vascular Surgery

## 2021-12-30 ENCOUNTER — Ambulatory Visit (INDEPENDENT_AMBULATORY_CARE_PROVIDER_SITE_OTHER): Payer: Medicare Other

## 2021-12-30 ENCOUNTER — Ambulatory Visit (INDEPENDENT_AMBULATORY_CARE_PROVIDER_SITE_OTHER): Payer: Medicare Other | Admitting: Vascular Surgery

## 2021-12-30 ENCOUNTER — Encounter: Payer: Self-pay | Admitting: Family Medicine

## 2021-12-30 ENCOUNTER — Ambulatory Visit (INDEPENDENT_AMBULATORY_CARE_PROVIDER_SITE_OTHER): Payer: Medicare Other | Admitting: Family Medicine

## 2021-12-30 ENCOUNTER — Ambulatory Visit (HOSPITAL_COMMUNITY)
Admission: RE | Admit: 2021-12-30 | Discharge: 2021-12-30 | Disposition: A | Discharge status: Home / Self Care | Payer: Medicare Other | Source: Ambulatory Visit | Attending: Family Medicine | Admitting: Family Medicine

## 2021-12-30 VITALS — BP 154/85 | HR 85 | Ht 73.0 in | Wt 204.3 lb

## 2021-12-30 VITALS — BP 146/91 | HR 73 | Temp 98.1°F | Resp 14 | Ht 73.0 in | Wt 203.6 lb

## 2021-12-30 DIAGNOSIS — I714 Abdominal aortic aneurysm, without rupture, unspecified: Secondary | ICD-10-CM

## 2021-12-30 DIAGNOSIS — I2510 Atherosclerotic heart disease of native coronary artery without angina pectoris: Secondary | ICD-10-CM | POA: Diagnosis not present

## 2021-12-30 DIAGNOSIS — R1031 Right lower quadrant pain: Secondary | ICD-10-CM | POA: Diagnosis not present

## 2021-12-30 DIAGNOSIS — R109 Unspecified abdominal pain: Secondary | ICD-10-CM | POA: Diagnosis not present

## 2021-12-30 DIAGNOSIS — I700 Atherosclerosis of aorta: Secondary | ICD-10-CM | POA: Diagnosis not present

## 2021-12-30 MED ORDER — IOHEXOL 300 MG/ML  SOLN
100.0000 mL | Freq: Once | INTRAMUSCULAR | Status: AC | PRN
  Administered 2021-12-30: 100 mL via INTRAVENOUS

## 2021-12-30 NOTE — Assessment & Plan Note (Signed)
CT obtained today for further evaluation. No findings to account for the RLQ. No evidence of diverticulitis. Non obstructive right nephrolithiasis. Recommended supportive and symptomatic care.

## 2021-12-30 NOTE — Progress Notes (Signed)
[facility] [place]  Patient name: [name] [name] MRN: [id number] DOB: 08-27-1940 Sex: male  REASON FOR VISIT: Follow-up known abdominal aortic aneurysm  HPI: [name] [name] is a 81 y.o. male here today for follow-up.  He remains in good health.  He has no new medical difficulties.  No symptoms referable to his aneurysm.  Past Medical History:  Diagnosis Date   AAA (abdominal aortic aneurysm) ([place])    needs yearly ultrasound   Allergy    Anemia    Arthritis    Asthma    BCC (basal cell carcinoma) 08/18/1989   left shoulder blad, upper right arm, left arm beyond elbow, c&d   BCC (basal cell carcinoma) 01/31/1992   Posterior neck, curetx3, 50f   BCC (basal cell carcinoma) 11/22/2001   mid forehead, cx3, excision, right forearm, cx3, 579f  BCC (basal cell carcinoma) 10/09/2003   mid forehead, MOHs   BCC (basal cell carcinoma) 08/15/2008   upper left back, biopsy   BPH (benign prostatic hyperplasia)    CAD (coronary artery disease)    Cancer (HCC)    skin cancer   COPD [place]ch[place] disease) [place]   Dysrhythmia    pt. states it can be fast at times   GERD (gastroesophageal reflux disease)    Glaucoma    HOH (hard of hearing)    Hypercholesterolemia    Hypertension    Impaired fasting glucose    Low back pain    Melanoma in situ ([facility]05/25/2005   left chin, MOHs   MI (myocardial infarction) ([facility]   SCC (squamous cell carcinoma) 07/03/2014   in situ, behind left ear, cx3, cautery, 25f29f SCC (squamous cell carcinoma) 07/03/2014   well diff, left forearm, biopsy, cx1, cautery   SCC (squamous cell carcinoma) 07/20/2017   in situ, left upper arm, cx3, 25fu49fSCC (squamous cell carcinoma) 01/10/2019   in situ, left post shoulder, cx3, 25fu 58fCC (squamous cell carcinoma) 11/22/2001   left forearm distal, left forearm, cx3, 25fu  103fC (squamous cell carcinoma) 10/09/2003   [place], left ear post,  clear per st, right cheek clear   SCC (squamous cell carcinoma) 03/30/2004   in situ, left upper arm, cx3, 25fu   43f (squamous cell carcinoma) 03/08/2005   in situ, right cheek, mid upper forehead, cx3, 25fu   S3f(squamous cell carcinoma) 06/08/2006   in situ, left shoulder, cx3, 25fu   SC56fsquamous cell carcinoma) 05/05/2010   right inner wrist, biopsy   SCC (squamous cell carcinoma) 09/13/2013   in situ, right crown scalp, front scalp, biopsy   Thrush     Family History  Problem Relation Age of Onset   Hypertension Mother    COPD Father    Cancer Brother        brain    SOCIAL HISTORY: Social History   Tobacco Use   Smoking status: Former    Packs/day: 1.00    Years: 35.00    Total pack years: 35.00    Types: Cigarettes    Quit date: 03/28/2003    Years since quitting: 18.7   Smokeless tobacco: Never  Substance Use Topics   Alcohol use: No    Alcohol/week: 0.0 standard drinks of alcohol    Allergies  Allergen Reactions   Beta Adrenergic Blockers Diarrhea and Other (See Comments)    Does not recall an allergy   Lasix [Furosemide] Rash  Cefzil [Cefprozil] Nausea Only    Patient does recall allergy   Ciprofloxacin Nausea And Vomiting, Rash and Other (See Comments)    Body aches    Dexamethasone Swelling   Gabapentin Swelling   Methocarbamol Swelling and Rash   Neomycin Other (See Comments)   Penicillins Swelling and Rash    Has patient had a PCN reaction causing immediate rash, facial/tongue/throat swelling, SOB or lightheadedness with hypotension: yes Has patient had a PCN reaction causing severe rash involving mucus membranes or skin necrosis: unknown Has patient had a PCN reaction that required hospitalization: no Has patient had a PCN reaction occurring within the last 10 years: No If all of the above answers are "NO", then may proceed with Cephalosporin use.    Tetracyclines & Related Itching    Current Outpatient Medications  Medication Sig  Dispense Refill   albuterol (PROVENTIL) (2.5 MG/3ML) 0.083% nebulizer solution Take 3 mLs (2.5 mg total) by nebulization every 6 (six) hours as needed for wheezing or shortness of breath. 360 mL 5   albuterol (VENTOLIN HFA) 108 (90 Base) MCG/ACT inhaler Inhale 1-2 puffs into the lungs every 6 (six) hours as needed for wheezing or shortness of breath. 18 g 5   alfuzosin (UROXATRAL) 10 MG 24 hr tablet TAKE 1 TABLET BY MOUTH ONCE DAILY WITH BREAKFAST. 90 tablet 0   cetirizine (ZYRTEC) 10 MG tablet Take 10 mg by mouth daily.     COMBIGAN 0.2-0.5 % ophthalmic solution Apply 1 drop to eye 2 (two) times daily.     desonide (DESOWEN) 0.05 % cream Apply topically 2 (two) times daily.     esomeprazole (NEXIUM) 40 MG capsule Take 40 mg by mouth daily.     fluticasone-salmeterol (ADVAIR HFA) 230-21 MCG/ACT inhaler Inhale 2 puffs into the lungs 2 (two) times daily. 12 g 5   hydroxypropyl methylcellulose / hypromellose (ISOPTO TEARS / GONIOVISC) 2.5 % ophthalmic solution Place 1 drop into both eyes 3 (three) times daily as needed for dry eyes.     LORazepam (ATIVAN) 0.5 MG tablet TAKE 1 TABLET BY MOUTH ONCE DAILY AT BEDTIME. 30 tablet 3   losartan (COZAAR) 25 MG tablet TAKE ONE TABLET BY MOUTH ONCE DAILY. 90 tablet 1   lovastatin (MEVACOR) 40 MG tablet TAKE 1 TABLET BY MOUTH DAILY. 90 tablet 0   metroNIDAZOLE (METROGEL) 0.75 % gel Apply topically.     montelukast (SINGULAIR) 10 MG tablet Take 1 tablet (10 mg total) by mouth at bedtime. 30 tablet 5   nitroGLYCERIN (NITROSTAT) 0.4 MG SL tablet Place 1 tablet (0.4 mg total) under the tongue every 5 (five) minutes as needed for chest pain. 25 tablet 5   oxymetazoline (AFRIN) 0.05 % nasal spray Place 1 spray into both nostrils 2 (two) times daily.     HYDROcodone-acetaminophen (NORCO/VICODIN) 5-325 MG tablet Take 1-2 tablets by mouth every 8 (eight) hours as needed for moderate pain or severe pain. (Patient not taking: Reported on 12/30/2021) 30 tablet 0   No current  facility-administered medications for this visit.    REVIEW OF SYSTEMS:  '[X]'$  denotes positive finding, '[ ]'$  denotes negative finding Cardiac  Comments:  Chest pain or chest pressure:    Shortness of breath upon exertion:    Short of breath when lying flat:    Irregular heart rhythm:        Vascular    Pain in calf, thigh, or hip brought on by ambulation:    Pain in feet at night that wakes you  up from your sleep:     Blood clot in your veins:    Leg swelling:           PHYSICAL EXAM: Vitals:   12/30/21 0919  BP: (!) 146/91  Pulse: 73  Resp: 14  Temp: 98.1 F (36.7 C)  TempSrc: Temporal  SpO2: 96%  Weight: 203 lb 9.6 oz (92.4 kg)  Height: '6\' 1"'$  (1.854 m)    GENERAL: The patient is a well-nourished male, in no acute distress. The vital signs are documented above. CARDIOVASCULAR: Abdomen soft and nontender.  I do not palpate an aneurysm.  Palpable femoral popliteal pulses with no evidence of peripheral aneurysm. PULMONARY: There is good air exchange  MUSCULOSKELETAL: There are no major deformities or cyanosis. NEUROLOGIC: No focal weakness or paresthesias are detected. SKIN: There are no ulcers or rashes noted. PSYCHIATRIC: The patient has a normal affect.  DATA:  Ultrasound today shows no change in his aneurysm size.  Maximal diameter is 5.1 cm.  Unchanged from CT scan 06/24/2021.  MEDICAL ISSUES: I recommended repeat ultrasound at 40-monthintervals.  He understands that threshold for repair would typically be around 5.5 cm.  I reviewed symptoms of leaking aneurysm with the patient he knows to present immediately to the emergency room should this occur    TRosetta Posner MD [facility]([phone] Note: Portions of this report may have been transcribed using voice recognition software.  Every effort has been made to ensure accuracy; however, inadvertent computerized transcription errors may still be present.

## 2021-12-30 NOTE — Progress Notes (Signed)
Subjective:  Patient ID: [name] [name], male    DOB: June 21, 1940  Age: 81 y.o. MRN: [id number]  CC: Chief Complaint  Patient presents with   Abdominal Pain    Sharp aching pain on lower right side of abdomen since yesterday and had about 3 episodes of it today.     HPI:  81 year old male with a history of the below mentioned medical problems presents for evaluation of abdominal pain.  Abdominal pain RLQ pain; started yesterday. Intermittent, sharp pain. Severe at times.  No fever. No other associated symptoms. No known relieving factors.  Currently not having any pain.  Patient Active Problem List   Diagnosis Date Noted   Right lower quadrant abdominal pain 12/30/2021   Osteoarthritis of right knee 12/06/2021   Seasonal allergies 08/06/2021   COPD (chronic obstructive pulmonary disease) ([place]) 04/23/2021   BPH (benign prostatic hyperplasia) 04/23/2021   CAD (coronary artery disease) 04/23/2021   HTN (hypertension) 09/16/2016   Lumbar spondylosis 11/05/2014   GERD (gastroesophageal reflux disease) 01/19/2012   Mixed hyperlipidemia 04/15/2008   AAA (abdominal aortic aneurysm) ([place]) 04/15/2008    Social Hx   Social History   Socioeconomic History   Marital status: Married    Spouse name: Not on file   Number of children: Not on file   Years of education: Not on file   Highest education level: Not on file  Occupational History   Not on file  Tobacco Use   Smoking status: Former    Packs/day: 1.00    Years: 35.00    Total pack years: 35.00    Types: Cigarettes    Quit date: 03/28/2003    Years since quitting: 18.7   Smokeless tobacco: Never  Vaping Use   Vaping Use: Never used  Substance and Sexual Activity   Alcohol use: No    Alcohol/week: 0.0 standard drinks of alcohol   Drug use: No   Sexual activity: Not Currently    Birth control/protection: None  Other Topics Concern   Not on file  Social History Narrative   Not on file   Social Determinants of  Health   Financial Resource Strain: Not on file  Food Insecurity: Not on file  Transportation Needs: Not on file  Physical Activity: Not on file  Stress: Not on file  Social Connections: Not on file    Review of Systems Per HPI  Objective:  BP (!) 154/85   Pulse 85   Ht '6\' 1"'$  (1.854 m)   Wt 204 lb 4.8 oz (92.7 kg)   SpO2 93%   BMI 26.95 kg/m      12/30/2021    4:01 PM 12/30/2021    9:19 AM 12/04/2021   11:04 AM  BP/Weight  Systolic BP 774 128 786  Diastolic BP 85 91 79  Wt. (Lbs) 204.3 203.6 203.2  BMI 26.95 kg/m2 26.86 kg/m2 26.81 kg/m2    Physical Exam Vitals and nursing note reviewed.  Constitutional:      General: He is not in acute distress.    Appearance: He is well-developed.  HENT:     Head: Normocephalic and atraumatic.  Eyes:     General:        Right eye: No discharge.        Left eye: No discharge.     Conjunctiva/sclera: Conjunctivae normal.  Cardiovascular:     Rate and Rhythm: Normal rate and regular rhythm.  Pulmonary:     Effort: Pulmonary effort is normal.  No respiratory distress.  Abdominal:     General: There is no distension.     Palpations: Abdomen is soft.     Hernia: There is no hernia in the left inguinal area or right inguinal area.     Comments: No tenderness on exam.  Genitourinary:    Testes: Normal.  Neurological:     Mental Status: He is alert.     Lab Results  Component Value Date   WBC 8.3 08/06/2021   HGB 12.9 (L) 08/06/2021   HCT 39.6 08/06/2021   PLT 265.0 08/06/2021   GLUCOSE 108 (H) 08/25/2021   CHOL 124 04/23/2021   TRIG 109 04/23/2021   HDL 39 (L) 04/23/2021   LDLCALC 65 04/23/2021   ALT 14 08/25/2021   AST 16 08/25/2021   NA 145 (H) 08/25/2021   K 4.4 08/25/2021   CL 103 08/25/2021   CREATININE 0.91 08/25/2021   BUN 12 08/25/2021   CO2 27 08/25/2021   TSH 1.230 02/14/2019   PSA 1.80 07/26/2014   HGBA1C 6.1 (H) 04/23/2021     Assessment & Plan:   Problem List Items Addressed This Visit        Other   Right lower quadrant abdominal pain - Primary    CT obtained today for further evaluation. No findings to account for the RLQ. No evidence of diverticulitis. Non obstructive right nephrolithiasis. Recommended supportive and symptomatic care.      Relevant Orders   CT Abdomen Pelvis W Contrast (Completed)   Thersa Salt DO [place]

## 2022-01-05 ENCOUNTER — Ambulatory Visit (INDEPENDENT_AMBULATORY_CARE_PROVIDER_SITE_OTHER): Payer: Medicare Other | Admitting: Pulmonary Disease

## 2022-01-05 ENCOUNTER — Encounter: Payer: Self-pay | Admitting: Pulmonary Disease

## 2022-01-05 VITALS — BP 110/70 | HR 65 | Temp 97.9°F | Ht 73.0 in | Wt 204.8 lb

## 2022-01-05 DIAGNOSIS — J449 Chronic obstructive pulmonary disease, unspecified: Secondary | ICD-10-CM | POA: Diagnosis not present

## 2022-01-05 NOTE — Patient Instructions (Signed)
Continue Advair on a regular basis  Prescription for spacer device for inhaler  Follow-up in 6 months  Call with significant concerns

## 2022-01-05 NOTE — Progress Notes (Signed)
History of Present Illness [name] [name] is a 81 y.o. male former  smoker ( quit 2004 with a 35 pack year smoking history) with COPD with chronic bronchitis and emphysema.  Maintenance medication  is Advair 230 Rescue is Albuterol  treated for mild exacerbation during last visit  Last exacerbation-required a Z-Pak   Appears to be doing well Activity level is maintaining  Denies any chest pains or chest discomfort  Triggers for shortness of breath include weather changes, exposure to dust  Pulmonary rehab in 2022  Last seen by APP March 2023-exacerbation -Overall better  Test Results: CXR 05/26/2020 Emphysematous changes. No pneumothorax or pleural effusion. No focal consolidation. Left hemidiaphragm eventration, unchanged. Stable cardiomediastinal silhouette. No acute osseous abnormality. No focal consolidation.  Emphysema.     Latest Ref Rng & Units 08/06/2021   11:01 AM 09/05/2020    9:20 AM 03/18/2020    5:08 PM  CBC  WBC 4.0 - 10.5 K/uL 8.3  6.7  10.3   Hemoglobin 13.0 - 17.0 g/dL 12.9  13.4  13.4   Hematocrit 39.0 - 52.0 % 39.6  41.3  42.9   Platelets 150.0 - 400.0 K/uL 265.0  214  214        Latest Ref Rng & Units 08/25/2021    9:59 AM 05/21/2021   12:03 PM 04/23/2021   11:32 AM  BMP  Glucose 70 - 99 mg/dL 108   101   BUN 8 - 27 mg/dL '12  12  12   '$ Creatinine 0.76 - 1.27 mg/dL 0.91  0.86  0.92   BUN/Creat Ratio 10 - '24 13  14  13   '$ Sodium 134 - 144 mmol/L 145   142   Potassium 3.5 - 5.2 mmol/L 4.4   4.7   Chloride 96 - 106 mmol/L 103   102   CO2 20 - 29 mmol/L 27   26   Calcium 8.6 - 10.2 mg/dL 9.4   9.2     BNP    Component Value Date/Time   BNP 76.4 08/25/2021 0959   BNP 44.0 09/16/2016 0235    ProBNP No results found for: "PROBNP"  PFT    Component Value Date/Time   FEV1PRE 1.58 11/05/2019 1342   FEV1POST 1.72 11/05/2019 1342   FVCPRE 2.80 11/05/2019 1342   FVCPOST 2.82 11/05/2019 1342   DLCOUNC 32.71 11/05/2019 1342   PREFEV1FVCRT 56  11/05/2019 1342   PSTFEV1FVCRT 61 11/05/2019 1342    CT Abdomen Pelvis W Contrast  Result Date: 12/30/2021 CLINICAL DATA:  Right lower quadrant abdominal pain and bloating EXAM: CT ABDOMEN AND PELVIS WITH CONTRAST TECHNIQUE: Multidetector CT imaging of the abdomen and pelvis was performed using the standard protocol following bolus administration of intravenous contrast. RADIATION DOSE REDUCTION: This exam was performed according to the departmental dose-optimization program which includes automated exposure control, adjustment of the mA and/or kV according to patient size and/or use of iterative reconstruction technique. CONTRAST:  169m OMNIPAQUE IOHEXOL 300 MG/ML SOLN additional enteric contrast COMPARISON:  06/10/2021 FINDINGS: Lower chest: No acute abnormality.  Coronary artery calcifications. Hepatobiliary: No focal liver abnormality is seen. Status post cholecystectomy. Postoperative biliary dilatation. Pancreas: Unremarkable. No pancreatic ductal dilatation or surrounding inflammatory changes. Spleen: Normal in size without significant abnormality. Adrenals/Urinary Tract: Adrenal glands are unremarkable. Punctuate nonobstructive calculi in the inferior pole of the right kidney. No left-sided calculi, ureteral calculi, or hydronephrosis. Bladder is unremarkable. Stomach/Bowel: Stomach is within normal limits. Status post partial right hemicolectomy and reanastomosis. No  evidence of bowel wall thickening, distention, or inflammatory changes. Descending and sigmoid diverticulosis. Vascular/Lymphatic: Aortic atherosclerosis. Unchanged aneurysm of the infrarenal abdominal aorta with eccentric mural thrombus, measuring up to 5.4 x 4.6 cm. No enlarged abdominal or pelvic lymph nodes. Reproductive: Mild Prostatomegaly. Other: Right inguinal hernia repair.  No ascites. Musculoskeletal: No acute or significant osseous findings. IMPRESSION: 1. No acute CT findings of the abdomen or pelvis to explain right lower  quadrant abdominal pain. 2. Status post partial right hemicolectomy and reanastomosis. 3. Descending and sigmoid diverticulosis without evidence of acute diverticulitis. 4. Nonobstructive right nephrolithiasis. 5. Unchanged aneurysm of the infrarenal abdominal aorta with eccentric mural thrombus, measuring up to 5.4 x 4.6 cm. Recommend follow-up CT/MR every 6 months and vascular consultation. This recommendation follows ACR consensus guidelines: White Paper of the ACR Incidental Findings Committee II on Vascular Findings. J Am Coll Radiol 2013; 10:789-794. 6. Coronary artery disease. Aortic Atherosclerosis (ICD10-I70.0). Electronically Signed   By: Delanna Ahmadi M.D.   On: 12/30/2021 19:25   VAS [place] AAA DUPLEX  Result Date: 12/30/2021 Patient Name:  [name] [name]  Date of Exam:   12/30/2021 Medical Rec #: [id number]      Accession #:    [id number] Date of Birth: Jan 26, 1941      Patient Gender: M Patient Age:   12 years Exam Location:  [facility] Procedure:      VAS [place] AAA DUPLEX Referring Phys: TODD EARLY --------------------------------------------------------------------------------  Indications: Follow up exam for known AAA. Risk Factors: Hypertension, hyperlipidemia, past history of smoking, coronary               artery disease. Limitations: Air/bowel gas.  Comparison Study: 02/27/20 at [facility] in [place]: Distal aorta 4.4 x 4.4 cm Performing Technologist: Ralene Cork RVT  Examination Guidelines: A complete evaluation includes B-mode imaging, spectral Doppler, color Doppler, and power Doppler as needed of all accessible portions of each vessel. Bilateral testing is considered an integral part of a complete examination. Limited examinations for reoccurring indications may be performed as noted.  Abdominal Aorta Findings: +-----------+-------+----------+----------+--------+--------+--------+ Location   AP (cm)Trans (cm)PSV (cm/s)WaveformThrombusComments  +-----------+-------+----------+----------+--------+--------+--------+ Proximal   3.00   2.71      45                                 +-----------+-------+----------+----------+--------+--------+--------+ Mid        3.22   3.24      53                                 +-----------+-------+----------+----------+--------+--------+--------+ Distal     5.01   4.61      41                                 +-----------+-------+----------+----------+--------+--------+--------+ RT CIA Prox1.9    1.6       51                                 +-----------+-------+----------+----------+--------+--------+--------+ LT CIA Prox1.9    1.8       61                                 +-----------+-------+----------+----------+--------+--------+--------+  Summary: Abdominal Aorta: There is evidence of abnormal dilatation of the distal Abdominal aorta. There is evidence of abnormal dilation of the Right Common Iliac artery and Left Common Iliac artery. The largest aortic diameter has increased compared to prior exam. Previous diameter measurement was 4.4 cm obtained on 02/27/20 at [facility] in [place].  *See table(s) above for measurements and observations.  Electronically signed by Curt Jews MD on 12/30/2021 at 12:52:12 PM.    Final      Past medical hx Past Medical History:  Diagnosis Date   AAA (abdominal aortic aneurysm) ([place])    needs yearly ultrasound   Allergy    Anemia    Arthritis    Asthma    BCC (basal cell carcinoma) 08/18/1989   left shoulder blad, upper right arm, left arm beyond elbow, c&d   BCC (basal cell carcinoma) 01/31/1992   Posterior neck, curetx3, 76f   BCC (basal cell carcinoma) 11/22/2001   mid forehead, cx3, excision, right forearm, cx3, 520f  BCC (basal cell carcinoma) 10/09/2003   mid forehead, MOHs   BCC (basal cell carcinoma) 08/15/2008   upper left back, biopsy   BPH (benign prostatic hyperplasia)    CAD (coronary artery disease)    Cancer (HCC)     skin cancer   COPD (chronic obstructive [place] disease) ([place]   Dysrhythmia    pt. states it can be fast at times   GERD (gastroesophageal reflux disease)    Glaucoma    HOH (hard of hearing)    Hypercholesterolemia    Hypertension    Impaired fasting glucose    Low back pain    Melanoma in situ ([place]/25/2005   left chin, MOHs   MI ([place]ocardial infarction) ([place]   SCC (squamous cell carcinoma) 07/03/2014   in situ, behind left ear, cx3, cautery, [id number] SCC (squamous cell carcinoma) 07/03/2014   well diff, left forearm, biopsy, cx1, cautery   SCC (squamous cell carcinoma) 07/20/2017   in situ, left upper arm, cx3, [id number] (squamous cell carcinoma) 01/10/2019   in situ, left post shoulder, cx3, 49fu 349fCC (squamous cell carcinoma) 11/22/2001   left forearm distal, left forearm, cx3, 49fu  78fC (squamous cell carcinoma) 10/09/2003   [place], left ear post, clear per st, right cheek clear   SCC (squamous cell carcinoma) 03/30/2004   in situ, left upper arm, cx3, 49fu   51f (squamous cell carcinoma) 03/08/2005   in situ, right cheek, mid upper forehead, cx3, 49fu   S63f(squamous cell carcinoma) 06/08/2006   in situ, left shoulder, cx3, 49fu   SC349fsquamous cell carcinoma) 05/05/2010   right inner wrist, biopsy   SCC (squamous cell carcinoma) 09/13/2013   in situ, right crown scalp, front scalp, biopsy   [place]      Social History   Tobacco Use   Smoking status: Former    Packs/day: 1.00    Years: 35.00    Total pack years: 35.00    Types: Cigarettes    Quit date: 03/28/2003    Years since quitting: 18.7   Smokeless tobacco: Never  Vaping Use   Vaping Use: Never used  Substance Use Topics   Alcohol use: No    Alcohol/week: 0.0 standard drinks of alcohol   Drug use: No    Mr.Luu reports that he quit smoking about 18 years ago. His smoking use included cigarettes. He has a 35.00 pack-year smoking history. He has never used smokeless  tobacco. He reports that he  does not drink alcohol and does not use drugs.  Tobacco Cessation: Former smoker , Quit 2004 with a 35 pack year smoking history  Past surgical hx, Family hx, Social hx all reviewed.  Current Outpatient Medications on File Prior to Visit  Medication Sig   albuterol (PROVENTIL) (2.5 MG/3ML) 0.083% nebulizer solution Take 3 mLs (2.5 mg total) by nebulization every 6 (six) hours as needed for wheezing or shortness of breath.   albuterol (VENTOLIN HFA) 108 (90 Base) MCG/ACT inhaler Inhale 1-2 puffs into the lungs every 6 (six) hours as needed for wheezing or shortness of breath.   alfuzosin (UROXATRAL) 10 MG 24 hr tablet TAKE 1 TABLET BY MOUTH ONCE DAILY WITH BREAKFAST.   cetirizine (ZYRTEC) 10 MG tablet Take 10 mg by mouth daily.   COMBIGAN 0.2-0.5 % ophthalmic solution Apply 1 drop to eye 2 (two) times daily.   desonide (DESOWEN) 0.05 % cream Apply topically 2 (two) times daily.   esomeprazole (NEXIUM) 40 MG capsule Take 40 mg by mouth daily.   fluticasone-salmeterol (ADVAIR HFA) 230-21 MCG/ACT inhaler Inhale 2 puffs into the lungs 2 (two) times daily.   hydroxypropyl methylcellulose / hypromellose (ISOPTO TEARS / GONIOVISC) 2.5 % ophthalmic solution Place 1 drop into both eyes 3 (three) times daily as needed for dry eyes.   LORazepam (ATIVAN) 0.5 MG tablet TAKE 1 TABLET BY MOUTH ONCE DAILY AT BEDTIME.   losartan (COZAAR) 25 MG tablet TAKE ONE TABLET BY MOUTH ONCE DAILY.   lovastatin (MEVACOR) 40 MG tablet TAKE 1 TABLET BY MOUTH DAILY.   metroNIDAZOLE (METROGEL) 0.75 % gel Apply topically.   montelukast (SINGULAIR) 10 MG tablet Take 1 tablet (10 mg total) by mouth at bedtime.   nitroGLYCERIN (NITROSTAT) 0.4 MG SL tablet Place 1 tablet (0.4 mg total) under the tongue every 5 (five) minutes as needed for chest pain.   oxymetazoline (AFRIN) 0.05 % nasal spray Place 1 spray into both nostrils 2 (two) times daily.   No current facility-administered medications on file prior to visit.      Allergies  Allergen Reactions   Beta Adrenergic Blockers Diarrhea and Other (See Comments)    Does not recall an allergy   Lasix [Furosemide] Rash   Cefzil [Cefprozil] Nausea Only    Patient does recall allergy   Ciprofloxacin Nausea And Vomiting, Rash and Other (See Comments)    Body aches    Dexamethasone Swelling   Gabapentin Swelling   Methocarbamol Swelling and Rash   Neomycin Other (See Comments)   Penicillins Swelling and Rash    Has patient had a PCN reaction causing immediate rash, facial/tongue/throat swelling, SOB or lightheadedness with hypotension: yes Has patient had a PCN reaction causing severe rash involving mucus membranes or skin necrosis: unknown Has patient had a PCN reaction that required hospitalization: no Has patient had a PCN reaction occurring within the last 10 years: No If all of the above answers are "NO", then may proceed with Cephalosporin use.    Tetracyclines & Related Itching    Review Of Systems:  No weight loss, no fever, no chills No visual complaints No chest pains or chest discomfort No shortness of breath at rest, no wheezing, does have a cough with sputum production No skin rash No genitourinary symptoms  Vital Signs BP 110/70 (BP Location: Left Arm)   Pulse 65   Temp 97.9 F (36.6 C)   Ht '6\' 1"'$  (1.854 m)   Wt 204 lb 12.8 oz (92.9  kg)   SpO2 97%   BMI 27.02 kg/m    Physical Exam:  Appearance -well-kempt ENMT -moist oral mucosa neck -supple, no JVD or thyromegaly Respiratory -clear breath sounds bilaterally  CV -S1-S2 appreciated Lymph -no adenopathy MSK -normal tone  Pulmonary function test from 11/05/2019 reveals moderately severe obstructive disease with normal diffusing capacity  Alpha-1 antitrypsin level of 138 IgE of 97  Assessment/Plan  Chronic obstructive pulmonary disease -Stable symptoms at present -Triggers include exposure to dust, weather changes -Symptoms appear well controlled with Advair  230  Use of albuterol as needed Plan -Continue Advair 230  Albuterol use as needed  Prescription for spacer device  Graded activities as tolerated  Follow-up in 6 months   Follow-up in 3 to 4 months Margot Oriordan Clearence Ped, MD 01/05/2022  11:34 AM

## 2022-01-06 ENCOUNTER — Other Ambulatory Visit: Payer: Self-pay | Admitting: Family Medicine

## 2022-01-06 DIAGNOSIS — E785 Hyperlipidemia, unspecified: Secondary | ICD-10-CM

## 2022-01-06 DIAGNOSIS — I10 Essential (primary) hypertension: Secondary | ICD-10-CM

## 2022-01-08 ENCOUNTER — Ambulatory Visit: Payer: Medicare Other | Admitting: Family Medicine

## 2022-01-12 ENCOUNTER — Ambulatory Visit (INDEPENDENT_AMBULATORY_CARE_PROVIDER_SITE_OTHER): Payer: Medicare Other | Admitting: Family Medicine

## 2022-01-12 DIAGNOSIS — M7022 Olecranon bursitis, left elbow: Secondary | ICD-10-CM

## 2022-01-12 DIAGNOSIS — M7020 Olecranon bursitis, unspecified elbow: Secondary | ICD-10-CM | POA: Insufficient documentation

## 2022-01-12 DIAGNOSIS — I2510 Atherosclerotic heart disease of native coronary artery without angina pectoris: Secondary | ICD-10-CM

## 2022-01-12 NOTE — Progress Notes (Signed)
Subjective:  Patient ID: [name] [name], male    DOB: [birth year]  Age: 81 y.o. MRN: [id number]  CC: Chief Complaint  Patient presents with   left elbow swelling and pain     X 1 week , recent skin biopsy removal 4 weeks ago    HPI:  81 year old male with extensive past medical history presents for evaluation of the above.  Patient reports ongoing swelling and redness of the left elbow.  Started last week and then subsequently improved.  However, worsened over the weekend which prompted his evaluation today.  Today he states that it seems to be much improved again.  He has some swelling at the olecranon.  No significant erythema at this time.  No fever.  Of note, patient recently had dermatology remove some areas at the elbow 4 weeks ago.  Patient Active Problem List   Diagnosis Date Noted   Olecranon bursitis 01/12/2022   Right lower quadrant abdominal pain 12/30/2021   Osteoarthritis of right knee 12/06/2021   Seasonal allergies 08/06/2021   COPD (chronic obstructive pulmonary disease) ([place]) 04/23/2021   BPH (benign prostatic hyperplasia) 04/23/2021   CAD (coronary artery disease) 04/23/2021   HTN (hypertension) 09/16/2016   Lumbar spondylosis 11/05/2014   GERD (gastroesophageal reflux disease) 01/19/2012   Mixed hyperlipidemia 04/15/2008   AAA (abdominal aortic aneurysm) ([place]) 04/15/2008    Social Hx   Social History   Socioeconomic History   Marital status: Married    Spouse name: Not on file   Number of children: Not on file   Years of education: Not on file   Highest education level: Not on file  Occupational History   Not on file  Tobacco Use   Smoking status: Former    Packs/day: 1.00    Years: 35.00    Total pack years: 35.00    Types: Cigarettes    Quit date: 03/28/2003    Years since quitting: 18.8   Smokeless tobacco: Never  Vaping Use   Vaping Use: Never used  Substance and Sexual Activity   Alcohol use: No    Alcohol/week: 0.0 standard drinks of  alcohol   Drug use: No   Sexual activity: Not Currently    Birth control/protection: None  Other Topics Concern   Not on file  Social History Narrative   Not on file   Social Determinants of Health   Financial Resource Strain: Not on file  Food Insecurity: Not on file  Transportation Needs: Not on file  Physical Activity: Not on file  Stress: Not on file  Social Connections: Not on file    Review of Systems Per HPI  Objective:  BP 118/76   Pulse 75   Temp 98.6 F (37 C)   Wt 205 lb (93 kg)   SpO2 99%   BMI 27.05 kg/m      01/12/2022   10:43 AM 01/05/2022   11:30 AM 12/30/2021    4:01 PM  BP/Weight  Systolic BP 509 326 712  Diastolic BP 76 70 85  Wt. (Lbs) 205 204.8 204.3  BMI 27.05 kg/m2 27.02 kg/m2 26.95 kg/m2    Physical Exam Vitals and nursing note reviewed.  Constitutional:      General: He is not in acute distress.    Appearance: Normal appearance.  HENT:     Head: Normocephalic and atraumatic.  Pulmonary:     Effort: Pulmonary effort is normal. No respiratory distress.  Musculoskeletal:     Comments: Left elbow with mild swelling  over the olecranon.  Nontender to palpation.  No significant surrounding erythema.  Neurological:     Mental Status: He is alert.  Psychiatric:        Mood and Affect: Mood normal.        Behavior: Behavior normal.     Lab Results  Component Value Date   WBC 8.3 08/06/2021   HGB 12.9 (L) 08/06/2021   HCT 39.6 08/06/2021   PLT 265.0 08/06/2021   GLUCOSE 108 (H) 08/25/2021   CHOL 124 04/23/2021   TRIG 109 04/23/2021   HDL 39 (L) 04/23/2021   LDLCALC 65 04/23/2021   ALT 14 08/25/2021   AST 16 08/25/2021   NA 145 (H) 08/25/2021   K 4.4 08/25/2021   CL 103 08/25/2021   CREATININE 0.91 08/25/2021   BUN 12 08/25/2021   CO2 27 08/25/2021   TSH 1.230 02/14/2019   PSA 1.80 07/26/2014   HGBA1C 6.1 (H) 04/23/2021     Assessment & Plan:   Problem List Items Addressed This Visit       Musculoskeletal and  Integument   Olecranon bursitis    Patient's exam consistent with olecranon bursitis.  Discussed treatment with oral medication versus watchful waiting given improvement.  Patient elects for the latter.  If worsens, he will let me know.      [place]

## 2022-01-12 NOTE — Assessment & Plan Note (Signed)
Patient's exam consistent with olecranon bursitis.  Discussed treatment with oral medication versus watchful waiting given improvement.  Patient elects for the latter.  If worsens, he will let me know.

## 2022-01-14 ENCOUNTER — Other Ambulatory Visit: Payer: Self-pay

## 2022-01-14 DIAGNOSIS — I714 Abdominal aortic aneurysm, without rupture, unspecified: Secondary | ICD-10-CM

## 2022-01-20 ENCOUNTER — Ambulatory Visit: Payer: Medicare Other | Admitting: General Surgery

## 2022-01-25 ENCOUNTER — Other Ambulatory Visit (HOSPITAL_COMMUNITY): Payer: Self-pay

## 2022-01-26 ENCOUNTER — Other Ambulatory Visit (HOSPITAL_COMMUNITY): Payer: Self-pay

## 2022-01-28 ENCOUNTER — Other Ambulatory Visit (HOSPITAL_COMMUNITY): Payer: Self-pay

## 2022-01-28 ENCOUNTER — Telehealth: Payer: Self-pay

## 2022-01-28 NOTE — Telephone Encounter (Signed)
Patient Advocate Encounter  Received a fax from [facility] regarding Prior Authorization for Albuterol Sulfate (2.5 MG/3ML)0.083% nebulizer solution.   Authorization has been DENIED due to The information received does not support approval under your Medicare Part D benefit; however,  coverage or payment may be available under your Part A or Part B benefit. The Social Security Act states  that a drug prescribed to a Part D eligible individual cannot be considered a covered Part D drug if  payment for the drug is or would be available under Medicare Part A or Part B. Medicare Part B covers  the requested drug when used in the home, or a home-type setting, with a nebulizer. Because the  information we have shows that you will be using the requested drug in a nebulizer in your home, it  cannot be covered under your Medicare Part D benefit.

## 2022-01-28 NOTE — Telephone Encounter (Signed)
Patient Advocate Encounter   Received notification from [facility] that prior authorization is required for Albuterol Sulfate (2.5 MG/3ML)0.083% nebulizer solution  Submitted: 01-28-2022 [place]

## 2022-01-29 ENCOUNTER — Encounter: Payer: Self-pay | Admitting: Urology

## 2022-01-29 ENCOUNTER — Ambulatory Visit (INDEPENDENT_AMBULATORY_CARE_PROVIDER_SITE_OTHER): Payer: Medicare Other | Admitting: Urology

## 2022-01-29 VITALS — BP 127/76 | HR 76

## 2022-01-29 DIAGNOSIS — I2510 Atherosclerotic heart disease of native coronary artery without angina pectoris: Secondary | ICD-10-CM

## 2022-01-29 DIAGNOSIS — R351 Nocturia: Secondary | ICD-10-CM | POA: Diagnosis not present

## 2022-01-29 DIAGNOSIS — N401 Enlarged prostate with lower urinary tract symptoms: Secondary | ICD-10-CM

## 2022-01-29 LAB — URINALYSIS, ROUTINE W REFLEX MICROSCOPIC
Bilirubin, UA: NEGATIVE
Glucose, UA: NEGATIVE
Leukocytes,UA: NEGATIVE
Nitrite, UA: NEGATIVE
Protein,UA: NEGATIVE
RBC, UA: NEGATIVE
Specific Gravity, UA: 1.02 (ref 1.005–1.030)
Urobilinogen, Ur: 1 mg/dL (ref 0.2–1.0)
pH, UA: 6 (ref 5.0–7.5)

## 2022-01-29 MED ORDER — ALFUZOSIN HCL ER 10 MG PO TB24
10.0000 mg | ORAL_TABLET | Freq: Every day | ORAL | 3 refills | Status: DC
Start: 2022-01-29 — End: 2023-01-28

## 2022-01-29 NOTE — Progress Notes (Signed)
01/29/2022 10:24 AM   [name] [name] [birth year] [id number]  Referring provider: Erven Colla, DO [place],  [place] [address]  Followup BPH and nocturia   HPI: [name] is a 81yo here for followup for BPh with nocturia. IPSS 9 QOL 3. Nocturia 2x on uroxatral '10mg'$ . Urine stream strong. No straining to urinate. No dysuria or hematuria. No other complaints today   PMH: Past Medical History:  Diagnosis Date   AAA (abdominal aortic aneurysm) ([place])    needs yearly ultrasound   Allergy    Anemia    Arthritis    Asthma    BCC (basal cell carcinoma) 08/18/1989   left shoulder blad, upper right arm, left arm beyond elbow, c&d   BCC (basal cell carcinoma) 01/31/1992   Posterior neck, curetx3, 23f   BCC (basal cell carcinoma) 11/22/2001   mid forehead, cx3, excision, right forearm, cx3, 519f  BCC (basal cell carcinoma) 10/09/2003   mid forehead, MOHs   BCC (basal cell carcinoma) 08/15/2008   upper left back, biopsy   BPH (benign prostatic hyperplasia)    CAD (coronary artery disease)    Cancer (HCC)    skin cancer   [facility]D [place]ch[facility] [place] disease) ([facility]   Dysrhythmia    pt. states it can be fast at times   GERD (gastroesophageal reflux disease)    Glaucoma    HOH (hard of hearing)    Hypercholesterolemia    Hypertension    Impaired fasting glucose    Low back pain    Melanoma in situ ([facility]/25/2005   left chin, MOHs   MI (myocardial infarction) ([facility] [place]   SCC (squamous cell carcinoma) 07/03/2014   in situ, behind left ear, cx3, cautery, [id number] SCC (squamous cell carcinoma) 07/03/2014   well diff, left forearm, biopsy, cx1, cautery   SCC (squamous cell carcinoma) 07/20/2017   in situ, left upper arm, cx3, 23fu26fSCC (squamous cell carcinoma) 01/10/2019   in situ, left post shoulder, cx3, 23fu 46fCC (squamous cell carcinoma) 11/22/2001   left forearm distal, left forearm, cx3, 23fu  54fC (squamous cell carcinoma) 10/09/2003   [place], left  ear post, clear per st, right cheek clear   SCC (squamous cell carcinoma) 03/30/2004   in situ, left upper arm, cx3, 23fu   20f (squamous cell carcinoma) 03/08/2005   in situ, right cheek, mid upper forehead, cx3, 23fu   S56f(squamous cell carcinoma) 06/08/2006   in situ, left shoulder, cx3, 23fu   SC89fsquamous cell carcinoma) 05/05/2010   right inner wrist, biopsy   SCC (squamous cell carcinoma) 09/13/2013   in situ, right crown scalp, front scalp, biopsy   Thrush     Surgical History: Past Surgical History:  Procedure Laterality Date   BACK SURGERY     x 3   CARDIAC CATHETERIZATION     angioplasty   CATARACT EXTRACTION W/PHACO  03/20/2012   Procedure: CATARACT EXTRACTION PHACO AND INTRAOCULAR LENS PLACEMENT (IOC);  [place]: [name] [name] FWilliams [place]: [facility];  Service: Ophthalmology;  Laterality: Right;  CDE:  8.45   CATARACT EXTRACTION W/PHACO Left 04/02/2013   Procedure: CATARACT EXTRACTION PHACO AND INTRAOCULAR LENS PLACEMENT (IOC);  Surgeon: [name] [name] FWilliams Checation: [facility];  Service: Ophthalmology;  Laterality: Left;  CDE:  6.50   CHOLECYSTECTOMY  2000   COLONOSCOPY  2009   repeat 5 years   ESOPHAGOGASTRODUODENOSCOPY     HERNIA REPAIR  Left    inguinal   INGUINAL HERNIA REPAIR Right 03/28/2020   Procedure: Right Inguinal Herniorrhaphy with Mesh;  Surgeon: [name] Signs, MD;  Location: [facility];  Service: General;  Laterality: Right;   LAPAROSCOPIC PARTIAL COLECTOMY N/A 06/11/2013   Procedure: LAPAROSCOPIC HAND ASSISTED PARTIAL COLECTOMY;  Surgeon: [name] So, MD;  Location: [facility];  Service: General;  Laterality: N/A;   NASAL ENDOSCOPY WITH EPISTAXIS CONTROL Bilateral 02/11/2020   Procedure: NASAL ENDOSCOPY WITH EPISTAXIS CONTROL;  Surgeon: [name] Baptist, MD;  Location: [facility] [facility];  Service: ENT;  Laterality: Bilateral;   right eye detached retina Bilateral    SPINAL FUSION  2505   YAG LASER APPLICATION Left 39/76/7341   Procedure: YAG LASER  APPLICATION;  Surgeon: [name] [name] Che, MD;  Location: [facility];  Service: Ophthalmology;  Laterality: Left;    Home Medications:  Allergies as of 01/29/2022       Reactions   Beta Adrenergic Blockers Diarrhea, Other (See Comments)   Does not recall an allergy   Lasix [furosemide] Rash   Cefzil [cefprozil] Nausea Only   Patient does recall allergy   Ciprofloxacin Nausea And Vomiting, Rash, Other (See Comments)   Body aches   Dexamethasone Swelling   Gabapentin Swelling   Methocarbamol Swelling, Rash   Neomycin Other (See Comments)   Penicillins Swelling, Rash   Has patient had a PCN reaction causing immediate rash, facial/tongue/throat swelling, SOB or lightheadedness with hypotension: yes Has patient had a PCN reaction causing severe rash involving mucus membranes or skin necrosis: unknown Has patient had a PCN reaction that required hospitalization: no Has patient had a PCN reaction occurring within the last 10 years: No If all of the above answers are "NO", then may proceed with Cephalosporin use.   Tetracyclines & Related Itching        Medication List        Accurate as of January 29, 2022 10:24 AM. If you have any questions, ask your nurse or doctor.          Advair HFA 230-21 MCG/ACT inhaler Generic drug: fluticasone-salmeterol Inhale 2 puffs into the lungs 2 (two) times daily.   albuterol (2.5 MG/3ML) 0.083% nebulizer solution Commonly known as: PROVENTIL Take 3 mLs (2.5 mg total) by nebulization every 6 (six) hours as needed for wheezing or shortness of breath.   albuterol 108 (90 Base) MCG/ACT inhaler Commonly known as: VENTOLIN HFA Inhale 1-2 puffs into the lungs every 6 (six) hours as needed for wheezing or shortness of breath.   alfuzosin 10 MG 24 hr tablet Commonly known as: UROXATRAL TAKE 1 TABLET BY MOUTH ONCE DAILY WITH BREAKFAST.   cetirizine 10 MG tablet Commonly known as: ZYRTEC Take 10 mg by mouth daily.   Combigan 0.2-0.5 % ophthalmic  solution Generic drug: brimonidine-timolol Apply 1 drop to eye 2 (two) times daily.   desonide 0.05 % cream Commonly known as: DESOWEN Apply topically 2 (two) times daily.   esomeprazole 40 MG capsule Commonly known as: NEXIUM Take 40 mg by mouth daily.   hydroxypropyl methylcellulose / hypromellose 2.5 % ophthalmic solution Commonly known as: ISOPTO TEARS / GONIOVISC Place 1 drop into both eyes 3 (three) times daily as needed for dry eyes.   LORazepam 0.5 MG tablet Commonly known as: ATIVAN TAKE 1 TABLET BY MOUTH ONCE DAILY AT BEDTIME.   losartan 25 MG tablet Commonly known as: COZAAR TAKE ONE TABLET BY MOUTH ONCE DAILY.   lovastatin 40 MG tablet Commonly known as:  MEVACOR TAKE 1 TABLET BY MOUTH DAILY.   metroNIDAZOLE 0.75 % gel Commonly known as: METROGEL Apply topically.   montelukast 10 MG tablet Commonly known as: SINGULAIR Take 1 tablet (10 mg total) by mouth at bedtime.   nitroGLYCERIN 0.4 MG SL tablet Commonly known as: NITROSTAT Place 1 tablet (0.4 mg total) under the tongue every 5 (five) minutes as needed for chest pain.   oxymetazoline 0.05 % nasal spray Commonly known as: AFRIN Place 1 spray into both nostrils 2 (two) times daily.        Allergies:  Allergies  Allergen Reactions   Beta Adrenergic Blockers Diarrhea and Other (See Comments)    Does not recall an allergy   Lasix [Furosemide] Rash   Cefzil [Cefprozil] Nausea Only    Patient does recall allergy   Ciprofloxacin Nausea And Vomiting, Rash and Other (See Comments)    Body aches    Dexamethasone Swelling   Gabapentin Swelling   Methocarbamol Swelling and Rash   Neomycin Other (See Comments)   Penicillins Swelling and Rash    Has patient had a PCN reaction causing immediate rash, facial/tongue/throat swelling, SOB or lightheadedness with hypotension: yes Has patient had a PCN reaction causing severe rash involving mucus membranes or skin necrosis: unknown Has patient had a PCN  reaction that required hospitalization: no Has patient had a PCN reaction occurring within the last 10 years: No If all of the above answers are "NO", then may proceed with Cephalosporin use.    Tetracyclines & Related Itching    Family History: Family History  Problem Relation Age of Onset   Hypertension Mother    COPD Father    Cancer Brother        brain    Social History:  reports that he quit smoking about 18 years ago. His smoking use included cigarettes. He has a 35.00 pack-year smoking history. He has never used smokeless tobacco. He reports that he does not drink alcohol and does not use drugs.  ROS: All other review of systems were reviewed and are negative except what is noted above in HPI  Physical Exam: BP 127/76   Pulse 76   Constitutional:  Alert and oriented, No acute distress. HEENT: [place] AT, moist mucus membranes.  Trachea midline, no masses. Cardiovascular: No clubbing, cyanosis, or edema. Respiratory: Normal respiratory effort, no increased work of breathing. GI: Abdomen is soft, nontender, nondistended, no abdominal masses GU: No CVA tenderness.  Lymph: No cervical or inguinal lymphadenopathy. Skin: No rashes, bruises or suspicious lesions. Neurologic: Grossly intact, no focal deficits, moving all 4 extremities. Psychiatric: Normal mood and affect.  Laboratory Data: Lab Results  Component Value Date   WBC 8.3 08/06/2021   HGB 12.9 (L) 08/06/2021   HCT 39.6 08/06/2021   MCV 85.2 08/06/2021   PLT 265.0 08/06/2021    Lab Results  Component Value Date   CREATININE 0.91 08/25/2021    Lab Results  Component Value Date   PSA 1.80 07/26/2014   PSA 1.42 03/26/2013    No results found for: "TESTOSTERONE"  Lab Results  Component Value Date   HGBA1C 6.1 (H) 04/23/2021    Urinalysis    Component Value Date/Time   COLORURINE STRAW (A) 12/28/2019 1541   APPEARANCEUR Clear 04/30/2020 1058   LABSPEC 1.005 12/28/2019 1541   PHURINE 8.0 12/28/2019  1541   GLUCOSEU Negative 04/30/2020 1058   HGBUR NEGATIVE 12/28/2019 1541   BILIRUBINUR Negative 04/30/2020 [place] 12/28/2019 1541  PROTEINUR Negative 04/30/2020 1058   PROTEINUR NEGATIVE 12/28/2019 1541   UROBILINOGEN 2.0 03/05/2016 1544   UROBILINOGEN 0.2 12/04/2014 0240   NITRITE Negative 04/30/2020 1058   NITRITE NEGATIVE 12/28/2019 1541   LEUKOCYTESUR Negative 04/30/2020 1058   LEUKOCYTESUR NEGATIVE 12/28/2019 1541    Lab Results  Component Value Date   LABMICR See below: 04/30/2020   WBCUA None seen 04/30/2020   LABEPIT None seen 04/30/2020   BACTERIA None seen 04/30/2020    Pertinent Imaging:  Results for orders placed during the hospital encounter of 11/11/14  DG Abd 1 View  Narrative CLINICAL DATA:  Difficulty urinating with burning and pelvic pressure for 6 days.  EXAM: ABDOMEN - 1 VIEW  COMPARISON:  None.  FINDINGS: The bowel gas pattern is nonobstructive. Prominent stool burden in the transverse colon is noted. No unexpected abdominal calcification is seen. The patient is status post lower lumbar fusion.  IMPRESSION: No acute abnormality.   Electronically Signed By: Inge Rise M.D. On: 11/11/2014 17:46  Results for orders placed in visit on 12/06/14  US Venous Img Lower Bilateral  Narrative CLINICAL DATA:  One month post L5-S1 fusion, presenting with bilateral lower extremity edema and elevated D-dimer.  EXAM: BILATERAL LOWER EXTREMITY VENOUS DOPPLER ULTRASOUND  TECHNIQUE: Gray-scale sonography with graded compression, as well as color Doppler and duplex ultrasound were performed to evaluate the lower extremity deep venous systems from the level of the common femoral vein and including the common femoral, femoral, profunda femoral, popliteal and calf veins including the posterior tibial, peroneal and gastrocnemius veins when visible. The superficial great saphenous vein was also interrogated. Spectral Doppler  was utilized to evaluate flow at rest and with distal augmentation maneuvers in the common femoral, femoral and popliteal veins.  COMPARISON:  None.  FINDINGS: RIGHT LOWER EXTREMITY  Common Femoral Vein: No evidence of thrombus. Normal compressibility, respiratory phasicity and response to augmentation.  Saphenofemoral Junction: No evidence of thrombus. Normal compressibility and flow on color Doppler imaging.  Profunda Femoral Vein: No evidence of thrombus. Normal compressibility and flow on color Doppler imaging.  Femoral Vein: No evidence of thrombus. Normal compressibility, respiratory phasicity and response to augmentation.  Popliteal Vein: No evidence of thrombus. Normal compressibility, respiratory phasicity and response to augmentation.  Calf Veins: No evidence of thrombus. Normal compressibility and flow on color Doppler imaging.  Superficial Great Saphenous Vein: No evidence of thrombus. Normal compressibility and flow on color Doppler imaging.  Venous Reflux:  Not evaluated.  Other Findings:  None.  LEFT LOWER EXTREMITY  Common Femoral Vein: No evidence of thrombus. Normal compressibility, respiratory phasicity and response to augmentation.  Saphenofemoral Junction: No evidence of thrombus. Normal compressibility and flow on color Doppler imaging.  Profunda Femoral Vein: No evidence of thrombus. Normal compressibility and flow on color Doppler imaging.  Femoral Vein: No evidence of thrombus. Normal compressibility, respiratory phasicity and response to augmentation.  Popliteal Vein: No evidence of thrombus. Normal compressibility, respiratory phasicity and response to augmentation.  Calf Veins: No evidence of thrombus. Normal compressibility and flow on color Doppler imaging.  Superficial Great Saphenous Vein: No evidence of thrombus. Normal compressibility and flow on color Doppler imaging.  Venous Reflux:  Not evaluated.  Other Findings:   None.  IMPRESSION: No evidence of DVT involving the right or left lower extremity.   Electronically Signed By: Evangeline Dakin M.D. On: 12/06/2014 17:15  No results found for this or any previous visit.  No results found for this or any previous visit.  No results found for this or any previous visit.  No results found for this or any previous visit.  No results found for this or any previous visit.  No results found for this or any previous visit.   Assessment & Plan:    1. BPH associated with nocturia Continue uroxatral '10mg'$  - Urinalysis, Routine w reflex microscopic  2. Nocturia Continue uroxatral '10mg'$    No follow-ups on file.  Nicolette Bang, MD  [facility] [place]

## 2022-01-29 NOTE — Patient Instructions (Signed)

## 2022-02-03 ENCOUNTER — Ambulatory Visit (INDEPENDENT_AMBULATORY_CARE_PROVIDER_SITE_OTHER): Payer: Medicare Other | Admitting: Family Medicine

## 2022-02-03 ENCOUNTER — Other Ambulatory Visit (HOSPITAL_COMMUNITY)
Admission: RE | Admit: 2022-02-03 | Discharge: 2022-02-03 | Disposition: A | Discharge status: Home / Self Care | Payer: Medicare Other | Source: Ambulatory Visit | Attending: Family Medicine | Admitting: Family Medicine

## 2022-02-03 VITALS — BP 118/76 | HR 63 | Temp 97.8°F | Ht 73.0 in | Wt 205.0 lb

## 2022-02-03 DIAGNOSIS — Z85828 Personal history of other malignant neoplasm of skin: Secondary | ICD-10-CM | POA: Diagnosis not present

## 2022-02-03 DIAGNOSIS — C44311 Basal cell carcinoma of skin of nose: Secondary | ICD-10-CM | POA: Diagnosis not present

## 2022-02-03 DIAGNOSIS — M7022 Olecranon bursitis, left elbow: Secondary | ICD-10-CM | POA: Diagnosis not present

## 2022-02-03 DIAGNOSIS — X32XXXD Exposure to sunlight, subsequent encounter: Secondary | ICD-10-CM | POA: Diagnosis not present

## 2022-02-03 DIAGNOSIS — I2510 Atherosclerotic heart disease of native coronary artery without angina pectoris: Secondary | ICD-10-CM | POA: Diagnosis not present

## 2022-02-03 DIAGNOSIS — Z08 Encounter for follow-up examination after completed treatment for malignant neoplasm: Secondary | ICD-10-CM | POA: Diagnosis not present

## 2022-02-03 DIAGNOSIS — L570 Actinic keratosis: Secondary | ICD-10-CM | POA: Diagnosis not present

## 2022-02-03 LAB — SYNOVIAL CELL COUNT + DIFF, W/ CRYSTALS
Crystals, Fluid: NONE SEEN
Eosinophils-Synovial: 0 % (ref 0–1)
Lymphocytes-Synovial Fld: 80 % — ABNORMAL HIGH (ref 0–20)
Monocyte-Macrophage-Synovial Fluid: 13 % — ABNORMAL LOW (ref 50–90)
Neutrophil, Synovial: 6 % (ref 0–25)
Other Cells-SYN: 1
WBC, Synovial: 310 /mm3 — ABNORMAL HIGH (ref 0–200)

## 2022-02-03 MED ORDER — PREDNISONE 10 MG PO TABS: ORAL_TABLET | ORAL | 0 refills | Status: DC

## 2022-02-03 NOTE — Assessment & Plan Note (Signed)
Worsening. Aspiration today. Sent for cell count, crystals, culture. No crystals noted. Cell count with increased WBC's. Gram stain with not evidence of organisms and no WBC's seen.  Awaiting culture. Placing on Prednisone.

## 2022-02-03 NOTE — Patient Instructions (Signed)
Rest, elevate, ice.  Prednisone as prescribed.  I will be in touch.

## 2022-02-03 NOTE — Progress Notes (Signed)
Subjective:  Patient ID: [name] [name], male    DOB: Oct 02, 1940  Age: 81 y.o. MRN: [id number]  CC: Chief Complaint  Patient presents with   left elbow swelling     Follow up , redness and kott palpable on leff elbow x 2 months    HPI: 81 year old male presents for evaluation of the above.  This has been an ongoing issue.  Recently seen on 8/29.  Seem to be improving at that time.  Now worsening.  No more swelling of the left olecranon.  Arm area.  2 months now.  No recent fall, trauma, injury.  He does note that he is a lot of pressure on his elbows when he is at the counter at home. No relieving factors. No other complaints.  Patient Active Problem List   Diagnosis Date Noted   Olecranon bursitis 01/12/2022   Right lower quadrant abdominal pain 12/30/2021   Osteoarthritis of right knee 12/06/2021   Seasonal allergies 08/06/2021   COPD (chronic obstructive pulmonary disease) ([place]) 04/23/2021   BPH (benign prostatic hyperplasia) 04/23/2021   CAD (coronary artery disease) 04/23/2021   HTN (hypertension) 09/16/2016   Lumbar spondylosis 11/05/2014   GERD (gastroesophageal reflux disease) 01/19/2012   Mixed hyperlipidemia 04/15/2008   AAA (abdominal aortic aneurysm) ([place]) 04/15/2008    Social Hx   Social History   Socioeconomic History   Marital status: Married    Spouse name: Not on file   Number of children: Not on file   Years of education: Not on file   Highest education level: Not on file  Occupational History   Not on file  Tobacco Use   Smoking status: Former    Packs/day: 1.00    Years: 35.00    Total pack years: 35.00    Types: Cigarettes    Quit date: 03/28/2003    Years since quitting: 18.8   Smokeless tobacco: Never  Vaping Use   Vaping Use: Never used  Substance and Sexual Activity   Alcohol use: No    Alcohol/week: 0.0 standard drinks of alcohol   Drug use: No   Sexual activity: Not Currently    Birth control/protection: None  Other Topics Concern    Not on file  Social History Narrative   Not on file   Social Determinants of Health   Financial Resource Strain: Not on file  Food Insecurity: Not on file  Transportation Needs: Not on file  Physical Activity: Not on file  Stress: Not on file  Social Connections: Not on file    Review of Systems Per HPI  Objective:  BP 118/76   Pulse 63   Temp 97.8 F (36.6 C)   Ht '6\' 1"'$  (1.854 m)   Wt 205 lb (93 kg)   SpO2 97%   BMI 27.05 kg/m      02/03/2022    8:25 AM 01/29/2022   10:10 AM 01/12/2022   10:43 AM  BP/Weight  Systolic BP 858 850 277  Diastolic BP 76 76 76  Wt. (Lbs) 205  205  BMI 27.05 kg/m2  27.05 kg/m2    Physical Exam Vitals and nursing note reviewed.  Constitutional:      General: He is not in acute distress.    Appearance: Normal appearance.  HENT:     Head: Normocephalic and atraumatic.  Pulmonary:     Effort: Pulmonary effort is normal. No respiratory distress.  Musculoskeletal:     Comments: Left elbow - swelling and mild erythema  noted over the olecranon.   Neurological:     Mental Status: He is alert.    Lab Results  Component Value Date   WBC 8.3 08/06/2021   HGB 12.9 (L) 08/06/2021   HCT 39.6 08/06/2021   PLT 265.0 08/06/2021   GLUCOSE 108 (H) 08/25/2021   CHOL 124 04/23/2021   TRIG 109 04/23/2021   HDL 39 (L) 04/23/2021   LDLCALC 65 04/23/2021   ALT 14 08/25/2021   AST 16 08/25/2021   NA 145 (H) 08/25/2021   K 4.4 08/25/2021   CL 103 08/25/2021   CREATININE 0.91 08/25/2021   BUN 12 08/25/2021   CO2 27 08/25/2021   TSH 1.230 02/14/2019   PSA 1.80 07/26/2014   HGBA1C 6.1 (H) 04/23/2021   Procedure: Fluid aspiration - Left elbow Risks/benefits discussed. Verbal consent given. Area cleansed with Alcohol and betadine.  Cold spray (Pain Ease) used for anesthesia. 18 gauge needed inserted over the olecranon and fluid aspirated. Hazy, serosanguinous fluid aspirated. No significant blood loss. Compression dressing  applied. Patient tolerated procedure well.  Fluid sent to lab for analysis.    Assessment & Plan:   Problem List Items Addressed This Visit       Musculoskeletal and Integument   Olecranon bursitis - Primary    Worsening. Aspiration today. Sent for cell count, crystals, culture. No crystals noted. Cell count with increased WBC's. Gram stain with not evidence of organisms and no WBC's seen.  Awaiting culture. Placing on Prednisone.       Relevant Orders   Cell count + diff,  w/ cryst-synvl fld   Body fluid culture    Meds ordered this encounter  Medications   predniSONE (DELTASONE) 10 MG tablet    Sig: 50 mg daily x 2 days, then 40 mg daily x 2 days, then 30 mg daily x 2 days, then 20 mg daily x 2 days, then 10 mg daily x 2 days.    Dispense:  30 tablet    Refill:  0    Follow-up:  Pending results.  [place]

## 2022-02-06 LAB — BODY FLUID CULTURE W GRAM STAIN
Culture: NO GROWTH
Gram Stain: NONE SEEN

## 2022-02-09 DIAGNOSIS — H903 Sensorineural hearing loss, bilateral: Secondary | ICD-10-CM | POA: Insufficient documentation

## 2022-02-09 DIAGNOSIS — R490 Dysphonia: Secondary | ICD-10-CM | POA: Diagnosis not present

## 2022-02-09 DIAGNOSIS — R040 Epistaxis: Secondary | ICD-10-CM | POA: Diagnosis not present

## 2022-02-16 ENCOUNTER — Telehealth: Payer: Self-pay | Admitting: Pulmonary Disease

## 2022-02-16 DIAGNOSIS — J441 Chronic obstructive pulmonary disease with (acute) exacerbation: Secondary | ICD-10-CM

## 2022-02-17 MED ORDER — FLUTICASONE-SALMETEROL 230-21 MCG/ACT IN AERO: 2.0000 | INHALATION_SPRAY | Freq: Two times a day (BID) | RESPIRATORY_TRACT | 5 refills | Status: DC

## 2022-02-17 MED ORDER — SPACER/AERO-HOLDING CHAMBERS DEVI: 0 refills | Status: AC

## 2022-02-17 NOTE — Telephone Encounter (Signed)
Spacer and Advair HFA sent to pharm  Pt aware  Nothing further needed

## 2022-02-17 NOTE — Telephone Encounter (Signed)
Patient called again. Patient states he is completely out of Advair. Patient uses [facility]  Please call patient when script is in

## 2022-02-19 ENCOUNTER — Ambulatory Visit (INDEPENDENT_AMBULATORY_CARE_PROVIDER_SITE_OTHER): Payer: Medicare Other | Admitting: Family Medicine

## 2022-02-19 VITALS — BP 121/74 | Ht 73.0 in | Wt 206.2 lb

## 2022-02-19 DIAGNOSIS — B349 Viral infection, unspecified: Secondary | ICD-10-CM | POA: Diagnosis not present

## 2022-02-19 DIAGNOSIS — I2510 Atherosclerotic heart disease of native coronary artery without angina pectoris: Secondary | ICD-10-CM

## 2022-02-19 DIAGNOSIS — R600 Localized edema: Secondary | ICD-10-CM | POA: Diagnosis not present

## 2022-02-19 LAB — POCT URINALYSIS DIPSTICK
Spec Grav, UA: 1.01 (ref 1.010–1.025)
pH, UA: 6 (ref 5.0–8.0)

## 2022-02-19 MED ORDER — NYSTATIN 100000 UNIT/ML MT SUSP: 5.0000 mL | Freq: Four times a day (QID) | OROMUCOSAL | 0 refills | Status: AC

## 2022-02-19 NOTE — Patient Instructions (Signed)
Rest.  Stay hydrated.  Use the medication for thrush if needed.  If symptoms persist over the weekend, let me know.  Take care  Dr. Lacinda Axon

## 2022-02-21 DIAGNOSIS — B349 Viral infection, unspecified: Secondary | ICD-10-CM | POA: Insufficient documentation

## 2022-02-21 NOTE — Assessment & Plan Note (Signed)
Overall he is well appearing.  Likely viral illness.  Nystatin to be used as directed for thrush if oral burning continues.  Supportive care.

## 2022-02-21 NOTE — Progress Notes (Signed)
Subjective:  Patient ID: [name] [name], male    DOB: 18-Dec-1940  Age: 81 y.o. MRN: [id number]  CC: Chief Complaint  Patient presents with   no energy    Ankles swelling- breathing seems off since being on prednisone    HPI:  81 year old male with the below mentioned medical problems presents with multiple complaints.  Not feeling well x 1 week. Reports congestion, SOB, oral burning sensation and "white stuff" on the tongue, loose stools, chills, urinary frequency. Also reports swelling in the ankles. Responds to elevation. No sick contacts. No documented fever. No relieving factors. Recent finished course of prednisone for Olecranon bursitis.   Patient Active Problem List   Diagnosis Date Noted   Viral illness 02/21/2022   Olecranon bursitis 01/12/2022   Osteoarthritis of right knee 12/06/2021   Seasonal allergies 08/06/2021   COPD (chronic obstructive pulmonary disease) ([place]) 04/23/2021   BPH (benign prostatic hyperplasia) 04/23/2021   CAD (coronary artery disease) 04/23/2021   HTN (hypertension) 09/16/2016   Lumbar spondylosis 11/05/2014   GERD (gastroesophageal reflux disease) 01/19/2012   Mixed hyperlipidemia 04/15/2008   AAA (abdominal aortic aneurysm) ([place]) 04/15/2008    Social Hx   Social History   Socioeconomic History   Marital status: Married    Spouse name: Not on file   Number of children: Not on file   Years of education: Not on file   Highest education level: Not on file  Occupational History   Not on file  Tobacco Use   Smoking status: Former    Packs/day: 1.00    Years: 35.00    Total pack years: 35.00    Types: Cigarettes    Quit date: 03/28/2003    Years since quitting: 18.9   Smokeless tobacco: Never  Vaping Use   Vaping Use: Never used  Substance and Sexual Activity   Alcohol use: No    Alcohol/week: 0.0 standard drinks of alcohol   Drug use: No   Sexual activity: Not Currently    Birth control/protection: None  Other Topics Concern    Not on file  Social History Narrative   Not on file   Social Determinants of Health   Financial Resource Strain: Not on file  Food Insecurity: Not on file  Transportation Needs: Not on file  Physical Activity: Not on file  Stress: Not on file  Social Connections: Not on file    Review of Systems Per HPI  Objective:  BP 121/74   Ht '6\' 1"'$  (1.854 m)   Wt 206 lb 3.2 oz (93.5 kg)   SpO2 96%   BMI 27.20 kg/m      02/19/2022    3:38 PM 02/03/2022    8:25 AM 01/29/2022   10:10 AM  BP/Weight  Systolic BP 703 500 938  Diastolic BP 74 76 76  Wt. (Lbs) 206.2 205   BMI 27.2 kg/m2 27.05 kg/m2     Physical Exam Vitals and nursing note reviewed.  Constitutional:      General: He is not in acute distress.    Appearance: Normal appearance.  HENT:     Head: Normocephalic and atraumatic.     Mouth/Throat:     Pharynx: Oropharynx is clear.  Cardiovascular:     Rate and Rhythm: Normal rate and regular rhythm.  Pulmonary:     Effort: Pulmonary effort is normal.     Breath sounds: Normal breath sounds. No wheezing or rales.  Abdominal:     Palpations: Abdomen is soft.  Tenderness: There is no abdominal tenderness.  Musculoskeletal:     Comments: Trace lower extremity edema bilaterally.   Neurological:     Mental Status: He is alert.  Psychiatric:        Mood and Affect: Mood normal.        Behavior: Behavior normal.     Lab Results  Component Value Date   WBC 8.3 08/06/2021   HGB 12.9 (L) 08/06/2021   HCT 39.6 08/06/2021   PLT 265.0 08/06/2021   GLUCOSE 108 (H) 08/25/2021   CHOL 124 04/23/2021   TRIG 109 04/23/2021   HDL 39 (L) 04/23/2021   LDLCALC 65 04/23/2021   ALT 14 08/25/2021   AST 16 08/25/2021   NA 145 (H) 08/25/2021   K 4.4 08/25/2021   CL 103 08/25/2021   CREATININE 0.91 08/25/2021   BUN 12 08/25/2021   CO2 27 08/25/2021   TSH 1.230 02/14/2019   PSA 1.80 07/26/2014   HGBA1C 6.1 (H) 04/23/2021     Assessment & Plan:   Problem List Items  Addressed This Visit       Other   Viral illness - Primary    Overall he is well appearing.  Likely viral illness.  Nystatin to be used as directed for thrush if oral burning continues.  Supportive care.      Relevant Medications   nystatin (MYCOSTATIN) 100000 UNIT/ML suspension   Other Visit Diagnoses     Pedal edema       Relevant Orders   POCT urinalysis dipstick (Completed)       Meds ordered this encounter  Medications   nystatin (MYCOSTATIN) 100000 UNIT/ML suspension    Sig: Take 5 mLs (500,000 Units total) by mouth 4 (four) times daily for 7 days. Swish and swallow    Dispense:  140 mL    Refill:  0    Follow-up:  PRN  [place]

## 2022-02-26 ENCOUNTER — Telehealth: Payer: Self-pay

## 2022-02-26 NOTE — Telephone Encounter (Signed)
Caller name:Xavius Jeani Hawking   On DPR? :Yes  Call back [phone]  Provider they see: Lacinda Axon   Reason for call:Pt is calling he seen Dr Lacinda Axon 10/06 said he is feeling just a little better but still not feeling good. Dr Lacinda Axon told him if he was not improving to call him back and he would call a antibiotic in for him.  Pt uses [facility]

## 2022-02-26 NOTE — Telephone Encounter (Signed)
Please advise. Thank you

## 2022-02-27 ENCOUNTER — Other Ambulatory Visit: Payer: Self-pay | Admitting: Family Medicine

## 2022-02-27 ENCOUNTER — Encounter: Payer: Self-pay | Admitting: Family Medicine

## 2022-02-27 MED ORDER — AZITHROMYCIN 250 MG PO TABS: ORAL_TABLET | ORAL | 0 refills | Status: AC

## 2022-03-03 ENCOUNTER — Ambulatory Visit (INDEPENDENT_AMBULATORY_CARE_PROVIDER_SITE_OTHER): Payer: Medicare Other | Admitting: Family Medicine

## 2022-03-03 ENCOUNTER — Ambulatory Visit (HOSPITAL_COMMUNITY)
Admission: RE | Admit: 2022-03-03 | Discharge: 2022-03-03 | Disposition: A | Discharge status: Home / Self Care | Payer: Medicare Other | Source: Ambulatory Visit | Attending: Family Medicine | Admitting: Family Medicine

## 2022-03-03 ENCOUNTER — Encounter: Payer: Self-pay | Admitting: Family Medicine

## 2022-03-03 ENCOUNTER — Other Ambulatory Visit (HOSPITAL_COMMUNITY)
Admission: RE | Admit: 2022-03-03 | Discharge: 2022-03-03 | Disposition: A | Discharge status: Home / Self Care | Payer: Medicare Other | Source: Ambulatory Visit | Attending: Family Medicine | Admitting: Family Medicine

## 2022-03-03 VITALS — BP 110/78 | HR 91 | Temp 98.4°F | Wt 203.8 lb

## 2022-03-03 DIAGNOSIS — R1031 Right lower quadrant pain: Secondary | ICD-10-CM | POA: Diagnosis not present

## 2022-03-03 DIAGNOSIS — I2510 Atherosclerotic heart disease of native coronary artery without angina pectoris: Secondary | ICD-10-CM | POA: Diagnosis not present

## 2022-03-03 LAB — CBC
HCT: 41.3 % (ref 39.0–52.0)
Hemoglobin: 13.2 g/dL (ref 13.0–17.0)
MCH: 28.3 pg (ref 26.0–34.0)
MCHC: 32 g/dL (ref 30.0–36.0)
MCV: 88.4 fL (ref 80.0–100.0)
Platelets: 229 10*3/uL (ref 150–400)
RBC: 4.67 MIL/uL (ref 4.22–5.81)
RDW: 13.7 % (ref 11.5–15.5)
WBC: 7.8 10*3/uL (ref 4.0–10.5)
nRBC: 0 % (ref 0.0–0.2)

## 2022-03-03 LAB — COMPREHENSIVE METABOLIC PANEL
ALT: 16 U/L (ref 0–44)
AST: 19 U/L (ref 15–41)
Albumin: 3.6 g/dL (ref 3.5–5.0)
Alkaline Phosphatase: 63 U/L (ref 38–126)
Anion gap: 5 (ref 5–15)
BUN: 13 mg/dL (ref 8–23)
CO2: 29 mmol/L (ref 22–32)
Calcium: 8.9 mg/dL (ref 8.9–10.3)
Chloride: 105 mmol/L (ref 98–111)
Creatinine, Ser: 0.88 mg/dL (ref 0.61–1.24)
GFR, Estimated: >60 mL/min (ref >60)
Glucose, Bld: 99 mg/dL (ref 70–99)
Potassium: 4.1 mmol/L (ref 3.5–5.1)
Sodium: 139 mmol/L (ref 135–145)
Total Bilirubin: 0.7 mg/dL (ref 0.3–1.2)
Total Protein: 7.3 g/dL (ref 6.5–8.1)

## 2022-03-03 LAB — LIPASE, BLOOD: Lipase: 44 U/L (ref 11–51)

## 2022-03-03 LAB — POCT I-STAT CREATININE: Creatinine, Ser: 0.9 mg/dL (ref 0.61–1.24)

## 2022-03-03 MED ORDER — IOHEXOL 300 MG/ML  SOLN
100.0000 mL | Freq: Once | INTRAMUSCULAR | Status: AC | PRN
  Administered 2022-03-03: 100 mL via INTRAVENOUS

## 2022-03-03 NOTE — Patient Instructions (Signed)
I will call with lab results and CT scan results.  Take care  Dr. Lacinda Axon

## 2022-03-03 NOTE — Progress Notes (Addendum)
Subjective:  Patient ID: [name] [name], male    DOB: [birth year]  Age: 81 y.o. MRN: [id number]  CC: Chief Complaint  Patient presents with   Abdominal Pain    Right sided lower abd area, chills, loose bowels. Some stomach upset. Began in the last 2 weeks.     HPI:  81 year old male with multiple medical problems presents for evaluation the above.  Patient has had previous issues with right lower quadrant pain with an unremarkable work-up.  He has been doing fairly well recently.  However, over the past 2 weeks he has had ongoing issues.  He has been experiencing intermittent right lower quadrant pain.  He has had loose stools.  Also reports nausea and decreased appetite.  Feels bloated.  He is also had chills.  No fever.  He has a history of diverticulitis.  Has had hemicolectomy previously for a polyp that was unable to be removed endoscopically.  Additionally has had right inguinal hernia repair.  No relieving factors.  No other complaints.  Patient Active Problem List   Diagnosis Date Noted   Right lower quadrant abdominal pain 03/03/2022   Viral illness 02/21/2022   Olecranon bursitis 01/12/2022   Osteoarthritis of right knee 12/06/2021   Seasonal allergies 08/06/2021   COPD (chronic obstructive pulmonary disease) ([place]) 04/23/2021   BPH (benign prostatic hyperplasia) 04/23/2021   CAD (coronary artery disease) 04/23/2021   HTN (hypertension) 09/16/2016   Lumbar spondylosis 11/05/2014   GERD (gastroesophageal reflux disease) 01/19/2012   Mixed hyperlipidemia 04/15/2008   AAA (abdominal aortic aneurysm) ([place]) 04/15/2008    Social Hx   Social History   Socioeconomic History   Marital status: Married    Spouse name: Not on file   Number of children: Not on file   Years of education: Not on file   Highest education level: Not on file  Occupational History   Not on file  Tobacco Use   Smoking status: Former    Packs/day: 1.00    Years: 35.00    Total pack years: 35.00     Types: Cigarettes    Quit date: 03/28/2003    Years since quitting: 18.9   Smokeless tobacco: Never  Vaping Use   Vaping Use: Never used  Substance and Sexual Activity   Alcohol use: No    Alcohol/week: 0.0 standard drinks of alcohol   Drug use: No   Sexual activity: Not Currently    Birth control/protection: None  Other Topics Concern   Not on file  Social History Narrative   Not on file   Social Determinants of Health   Financial Resource Strain: Not on file  Food Insecurity: Not on file  Transportation Needs: Not on file  Physical Activity: Not on file  Stress: Not on file  Social Connections: Not on file    Review of Systems Per HPI  Objective:  BP 110/78   Pulse 91   Temp 98.4 F (36.9 C)   Wt 203 lb 12.8 oz (92.4 kg)   SpO2 98%   BMI 26.89 kg/m      03/03/2022   11:26 AM 02/19/2022    3:38 PM 02/03/2022    8:25 AM  BP/Weight  Systolic BP 616 073 710  Diastolic BP 78 74 76  Wt. (Lbs) 203.8 206.2 205  BMI 26.89 kg/m2 27.2 kg/m2 27.05 kg/m2    Physical Exam Vitals and nursing note reviewed.  Constitutional:      General: He is not in acute distress.  Appearance: Normal appearance.  HENT:     Head: Normocephalic and atraumatic.  Cardiovascular:     Rate and Rhythm: Normal rate and regular rhythm.  Pulmonary:     Effort: Pulmonary effort is normal.     Breath sounds: Normal breath sounds. No wheezing or rales.  Abdominal:     General: There is no distension.     Palpations: Abdomen is soft.     Comments: No significant tenderness on exam.  Neurological:     Mental Status: He is alert.  Psychiatric:        Mood and Affect: Mood normal.        Behavior: Behavior normal.     Lab Results  Component Value Date   WBC 8.3 08/06/2021   HGB 12.9 (L) 08/06/2021   HCT 39.6 08/06/2021   PLT 265.0 08/06/2021   GLUCOSE 108 (H) 08/25/2021   CHOL 124 04/23/2021   TRIG 109 04/23/2021   HDL 39 (L) 04/23/2021   LDLCALC 65 04/23/2021   ALT 14  08/25/2021   AST 16 08/25/2021   NA 145 (H) 08/25/2021   K 4.4 08/25/2021   CL 103 08/25/2021   CREATININE 0.91 08/25/2021   BUN 12 08/25/2021   CO2 27 08/25/2021   TSH 1.230 02/14/2019   PSA 1.80 07/26/2014   HGBA1C 6.1 (H) 04/23/2021     Assessment & Plan:   Problem List Items Addressed This Visit       Other   Right lower quadrant abdominal pain - Primary    Uncertain etiology at this time.  Previous CT scan reviewed today.  Previous labs reviewed. I have ordered stat labs today for further evaluation.  Also, arranging CT scan for further evaluation given his ongoing issues and lack of improvement as well as his prior history of diverticulitis as well as colon mass which required hemicolectomy.      Relevant Orders   CBC   Lipase   Comprehensive metabolic panel   CT Abdomen Pelvis W Contrast   Follow-up: Pending work-up  [place]

## 2022-03-03 NOTE — Assessment & Plan Note (Addendum)
Uncertain etiology at this time.  Previous CT scan reviewed today.  Previous labs reviewed. I have ordered stat labs today for further evaluation.  Also, arranging CT scan for further evaluation given his ongoing issues and lack of improvement as well as his prior history of diverticulitis as well as colon mass which required hemicolectomy.

## 2022-03-11 DIAGNOSIS — B9689 Other specified bacterial agents as the cause of diseases classified elsewhere: Secondary | ICD-10-CM | POA: Diagnosis not present

## 2022-03-11 DIAGNOSIS — X32XXXD Exposure to sunlight, subsequent encounter: Secondary | ICD-10-CM | POA: Diagnosis not present

## 2022-03-11 DIAGNOSIS — Z85820 Personal history of malignant melanoma of skin: Secondary | ICD-10-CM | POA: Diagnosis not present

## 2022-03-11 DIAGNOSIS — Z85828 Personal history of other malignant neoplasm of skin: Secondary | ICD-10-CM | POA: Diagnosis not present

## 2022-03-11 DIAGNOSIS — L02828 Furuncle of other sites: Secondary | ICD-10-CM | POA: Diagnosis not present

## 2022-03-11 DIAGNOSIS — Z08 Encounter for follow-up examination after completed treatment for malignant neoplasm: Secondary | ICD-10-CM | POA: Diagnosis not present

## 2022-03-11 DIAGNOSIS — L570 Actinic keratosis: Secondary | ICD-10-CM | POA: Diagnosis not present

## 2022-03-11 DIAGNOSIS — Z1283 Encounter for screening for malignant neoplasm of skin: Secondary | ICD-10-CM | POA: Diagnosis not present

## 2022-03-12 ENCOUNTER — Telehealth: Payer: Self-pay | Admitting: Family Medicine

## 2022-03-12 NOTE — Telephone Encounter (Signed)
Left message for patient to call back and schedule Medicare Annual Wellness Visit (AWV) .  Please offer to do virtually or by telephone.  Last AWV: 02/13/2019  Please schedule at anytime with [facility].  30 minute appointment for Virtual or phone  Any questions, please contact me at [phone]

## 2022-03-13 ENCOUNTER — Other Ambulatory Visit: Payer: Self-pay | Admitting: Family Medicine

## 2022-03-13 DIAGNOSIS — G4700 Insomnia, unspecified: Secondary | ICD-10-CM

## 2022-03-17 DIAGNOSIS — K589 Irritable bowel syndrome without diarrhea: Secondary | ICD-10-CM | POA: Insufficient documentation

## 2022-03-17 DIAGNOSIS — M5030 Other cervical disc degeneration, unspecified cervical region: Secondary | ICD-10-CM | POA: Insufficient documentation

## 2022-03-17 DIAGNOSIS — M51369 Other intervertebral disc degeneration, lumbar region without mention of lumbar back pain or lower extremity pain: Secondary | ICD-10-CM | POA: Insufficient documentation

## 2022-03-17 DIAGNOSIS — M1990 Unspecified osteoarthritis, unspecified site: Secondary | ICD-10-CM | POA: Insufficient documentation

## 2022-03-17 DIAGNOSIS — M5136 Other intervertebral disc degeneration, lumbar region: Secondary | ICD-10-CM | POA: Insufficient documentation

## 2022-03-17 DIAGNOSIS — L409 Psoriasis, unspecified: Secondary | ICD-10-CM | POA: Insufficient documentation

## 2022-03-18 ENCOUNTER — Ambulatory Visit (INDEPENDENT_AMBULATORY_CARE_PROVIDER_SITE_OTHER): Payer: Medicare Other

## 2022-03-18 VITALS — Wt 203.0 lb

## 2022-03-18 DIAGNOSIS — Z0000 Encounter for general adult medical examination without abnormal findings: Secondary | ICD-10-CM

## 2022-03-18 NOTE — Progress Notes (Signed)
Virtual Visit via Telephone Note  I connected with  [name] [name] on 03/18/22 at 10:00 AM EDT by telephone and verified that I am speaking with the correct person using two identifiers.  Location: Patient: home Provider: [facility] Persons participating in the virtual visit: patient/Nurse Health Advisor   I discussed the limitations, risks, security and privacy concerns of performing an evaluation and management service by telephone and the availability of in person appointments. The patient expressed understanding and agreed to proceed.  Interactive audio and video telecommunications were attempted between this nurse and patient, however failed, due to patient having technical difficulties OR patient did not have access to video capability.  We continued and completed visit with audio only.  Some vital signs may be absent or patient reported.   [name] David, LPN  Subjective:   [name] [name] is a 81 y.o. male who presents for Medicare Annual/Subsequent preventive examination.  Review of Systems     Cardiac Risk Factors include: advanced age (>26mn, >>31women);hypertension;male gender     Objective:    There were no vitals filed for this visit. There is no height or weight on file to calculate BMI.     03/18/2022   10:24 AM 05/28/2021   10:43 AM 12/10/2020    1:16 PM 03/28/2020    9:12 AM 03/25/2020    3:27 PM 03/18/2020    4:54 PM 02/11/2020    8:30 AM  Advanced Directives  Does Patient Have a Medical Advance Directive? No Yes Yes Yes Yes No Yes  Type of AScientist, physiologicalof [place] will Living will;Healthcare Power of Attorney Living will;Healthcare Power of AVanlue Does patient want to make changes to medical advance directive?  No - Patient declined  No - Patient declined   No - Patient declined  Copy of [place] Chart?    No - copy requested No - copy requested  No - copy requested  Would  patient like information on creating a medical advance directive? No - Patient declined          Current Medications (verified) Outpatient Encounter Medications as of 03/18/2022  Medication Sig   albuterol (PROVENTIL) (2.5 MG/3ML) 0.083% nebulizer solution Take 3 mLs (2.5 mg total) by nebulization every 6 (six) hours as needed for wheezing or shortness of breath.   albuterol (VENTOLIN HFA) 108 (90 Base) MCG/ACT inhaler Inhale 1-2 puffs into the lungs every 6 (six) hours as needed for wheezing or shortness of breath.   alfuzosin (UROXATRAL) 10 MG 24 hr tablet Take 1 tablet (10 mg total) by mouth daily with breakfast.   cetirizine (ZYRTEC) 10 MG tablet Take 10 mg by mouth daily.   COMBIGAN 0.2-0.5 % ophthalmic solution Apply 1 drop to eye 2 (two) times daily.   desonide (DESOWEN) 0.05 % cream Apply topically 2 (two) times daily.   esomeprazole (NEXIUM) 20 MG capsule Take by mouth.   esomeprazole (NEXIUM) 40 MG capsule Take 40 mg by mouth daily.   fluticasone-salmeterol (ADVAIR HFA) 230-21 MCG/ACT inhaler Inhale 2 puffs into the lungs 2 (two) times daily.   hydroxypropyl methylcellulose / hypromellose (ISOPTO TEARS / GONIOVISC) 2.5 % ophthalmic solution Apply to eye.   ibuprofen (ADVIL) 200 MG tablet 3 tablets with food or milk as needed Orally Three times a day   LORazepam (ATIVAN) 0.5 MG tablet TAKE 1 TABLET BY MOUTH ONCE DAILY AT BEDTIME.   losartan (COZAAR) 25 MG  tablet 1 tablet Orally Once a day for 30 day(s)   lovastatin (MEVACOR) 40 MG tablet TAKE 1 TABLET BY MOUTH DAILY.   metroNIDAZOLE (METROGEL) 0.75 % gel Apply topically.   mupirocin ointment (BACTROBAN) 2 % Apply topically 3 (three) times daily.   nitroGLYCERIN (NITROSTAT) 0.4 MG SL tablet Place 1 tablet (0.4 mg total) under the tongue every 5 (five) minutes as needed for chest pain.   oxymetazoline (AFRIN) 0.05 % nasal spray Place 1 spray into both nostrils 2 (two) times daily.   Spacer/Aero-Holding [name] Use as directed    [DISCONTINUED] LORazepam (ATIVAN) 0.5 MG tablet Take by mouth.   [DISCONTINUED] lovastatin (MEVACOR) 40 MG tablet Take by mouth.   hydroxypropyl methylcellulose / hypromellose (ISOPTO TEARS / GONIOVISC) 2.5 % ophthalmic solution Place 1 drop into both eyes 3 (three) times daily as needed for dry eyes.   latanoprost (XALATAN) 0.005 % ophthalmic solution 1 drop into affected eye in the evening Ophthalmic Once a day (Patient not taking: Reported on 03/18/2022)   losartan (COZAAR) 25 MG tablet TAKE ONE TABLET BY MOUTH ONCE DAILY.   montelukast (SINGULAIR) 10 MG tablet Take 1 tablet (10 mg total) by mouth at bedtime. (Patient not taking: Reported on 03/18/2022)   predniSONE (DELTASONE) 10 MG tablet 50 mg daily x 2 days, then 40 mg daily x 2 days, then 30 mg daily x 2 days, then 20 mg daily x 2 days, then 10 mg daily x 2 days. (Patient not taking: Reported on 03/18/2022)   [DISCONTINUED] cetirizine (ZYRTEC) 10 MG tablet Take 1 tablet by mouth daily.   [DISCONTINUED] sucralfate (CARAFATE) 1 g tablet 1 tablet on an empty stomach Orally Twice a day for 30 day(s) (Patient not taking: Reported on 03/18/2022)   No facility-administered encounter medications on file as of 03/18/2022.    Allergies (verified) Beta adrenergic blockers, Lasix [furosemide], Penicillin g, Penicillin g sodium, Cefzil [cefprozil], Ciprofloxacin, Dexamethasone, Gabapentin, Methocarbamol, Neomycin, Penicillins, and Tetracyclines & related   History: Past Medical History:  Diagnosis Date   AAA (abdominal aortic aneurysm) ([place])    needs yearly ultrasound   Allergy    Anemia    Arthritis    Asthma    BCC (basal cell carcinoma) 08/18/1989   left shoulder blad, upper right arm, left arm beyond elbow, c&d   BCC (basal cell carcinoma) 01/31/1992   Posterior neck, curetx3, 12f   BCC (basal cell carcinoma) 11/22/2001   mid forehead, cx3, excision, right forearm, cx3, 588f  BCC (basal cell carcinoma) 10/09/2003   mid forehead, MOHs   BCC  (basal cell carcinoma) 08/15/2008   upper left back, biopsy   BPH (benign prostatic hyperplasia)    CAD (coronary artery disease)    Cancer (HCC)    skin cancer   COPD (chronic obstructive pulmonary disease) ([place]   Dysrhythmia    pt. states it can be fast at times   GERD (gastroesophageal reflux disease)    Glaucoma    HOH (hard of hearing)    Hypercholesterolemia    Hypertension    Impaired fasting glucose    Low back pain    Melanoma in situ ([place]/25[place]2005   left chin, MOHs   MI (myocardial infarction) ([place]   SCC (squamous cell carcinoma) 07/03/2014   in situ, behind left ear, cx3, cautery, 6f52f SCC (squamous cell carcinoma) 07/03/2014   well diff, left forearm, biopsy, cx1, cautery   SCC (squamous cell carcinoma) 07/20/2017   in situ, left  upper arm, cx3, 63f   SCC (squamous cell carcinoma) 01/10/2019   in situ, left post shoulder, cx3, 511f  SCC (squamous cell carcinoma) 11/22/2001   left forearm distal, left forearm, cx3, 55f52f SCC (squamous cell carcinoma) 10/09/2003   [place], left ear post, clear per st, right cheek clear   SCC (squamous cell carcinoma) 03/30/2004   in situ, left upper arm, cx3, 55fu78fSCC (squamous cell carcinoma) 03/08/2005   in situ, right cheek, mid upper forehead, cx3, 55fu 68fCC (squamous cell carcinoma) 06/08/2006   in situ, left shoulder, cx3, 55fu  6fC (squamous cell carcinoma) 05/05/2010   right inner wrist, biopsy   SCC (squamous cell carcinoma) 09/13/2013   in situ, right crown scalp, front scalp, biopsy   Thrush    Past Surgical History:  Procedure Laterality Date   BACK SURGERY     x 3   CARDIAC CATHETERIZATION     angioplasty   CATARACT EXTRACTION W/PHACO  03/20/2012   Procedure: CATARACT EXTRACTION PHACO AND INTRAOCULAR LENS PLACEMENT (IOC); [place] Carrol[place] Che Location: [facility];  Service: Ophthalmology;  Laterality: Right;  CDE:  8.45   CATARACT EXTRACTION W/PHACO Left 04/02/2013   Procedure: CATARACT  EXTRACTION PHACO AND INTRAOCULAR LENS PLACEMENT (IOC);  Surgeon: CarrolWilliams Che Location: [facility];  Service: Ophthalmology;  Laterality: Left;  CDE:  6.50   CHOLECYSTECTOMY  2000   COLONOSCOPY  2009   repeat 5 years   ESOPHAGOGASTRODUODENOSCOPY     HERNIA REPAIR Left    inguinal   INGUINAL HERNIA REPAIR Right 03/28/2020   Procedure: Right Inguinal Herniorrhaphy with Mesh;  Surgeon: JenkinAviva Signs Location: [facility];  Service: General;  Laterality: Right;   LAPAROSCOPIC PARTIAL COLECTOMY N/A 06/11/2013   Procedure: LAPAROSCOPIC HAND ASSISTED PARTIAL COLECTOMY;  Surgeon: Mark AJamesetta So Location: [facility];  Service: General;  Laterality: N/A;   NASAL ENDOSCOPY WITH EPISTAXIS CONTROL Bilateral 02/11/2020   Procedure: NASAL ENDOSCOPY WITH EPISTAXIS CONTROL;  Surgeon: Teoh, Leta Baptist [facility];  Laterality: Bilateral;   right eye detached retina Bilateral    SPINAL FUSION  2016  4854 LASER APPLICATION Left 05/06/61/70/3500cedure: YAG LASER APPLICATION;  Surgeon: CarrolWilliams Che Location: [facility];  Service: Ophthalmology;  Laterality: Left;   Family History  Problem Relation Age of Onset   Hypertension Mother    COPD Father    Cancer Brother        brain   Social History   Socioeconomic History   Marital status: Married    Spouse name: Not on file   Number of children: Not on file   Years of education: Not on file   Highest education level: Not on file  Occupational History   Not on file  Tobacco Use   Smoking status: Former    Packs/day: 1.00    Years: 35.00    Total pack years: 35.00    Types: Cigarettes    Quit date: 03/28/2003    Years since quitting: 18.9   Smokeless tobacco: Never  Vaping Use   Vaping Use: Never used  Substance and Sexual Activity   Alcohol use: No    Alcohol/week: 0.0 standard drinks of alcohol   Drug use: No   Sexual activity: Not Currently    Birth control/protection: None  Other Topics  Concern   Not on file  Social  History Narrative   Not on file   Social Determinants of Health   Financial Resource Strain: Low Risk  (03/18/2022)   Overall Financial Resource Strain (CARDIA)    Difficulty of Paying Living Expenses: Not very hard  Food Insecurity: No Food Insecurity (03/18/2022)   Hunger Vital Sign    Worried About Running Out of Food in the Last Year: Never true    Ran Out of Food in the Last Year: Never true  Transportation Needs: No Transportation Needs (03/18/2022)   PRAPARE - Hydrologist (Medical): No    Lack of Transportation (Non-Medical): No  Physical Activity: Insufficiently Active (03/18/2022)   Exercise Vital Sign    Days of Exercise per Week: 2 days    Minutes of Exercise per Session: 20 min  Stress: No Stress Concern Present (03/18/2022)   [place]    Feeling of Stress : Not at all  Social Connections: Socially Isolated (03/18/2022)   Social Connection and Isolation Panel [NHANES]    Frequency of Communication with Friends and Family: More than three times a week    Frequency of Social Gatherings with Friends and Family: Three times a week    Attends Religious Services: Never    Active Member of Clubs or Organizations: No    Attends Archivist Meetings: Never    Marital Status: Widowed    Tobacco Counseling Counseling given: Not Answered   Clinical Intake:  Pre-visit preparation completed: Yes  Pain : No/denies pain     Diabetes: No  How often do you need to have someone help you when you read instructions, pamphlets, or other written materials from your doctor or pharmacy?: 1 - Never  Diabetic?no  Interpreter Needed?: No  Information entered by :: Kirke Shaggy, LPN   Activities of Daily Living    03/18/2022   10:24 AM  In your present state of health, do you have any difficulty performing the following activities:  Hearing? 1   Vision? 0  Difficulty concentrating or making decisions? 0  Walking or climbing stairs? 0  Dressing or bathing? 0  Doing errands, shopping? 0  Preparing Food and eating ? N  Using the Toilet? N  In the past six months, have you accidently leaked urine? N  Do you have problems with loss of bowel control? N  Managing your Medications? N  Managing your Finances? N  Housekeeping or managing your Housekeeping? N    Patient Care Team: Coral Spikes, DO as PCP - General (Family Medicine) Josue Hector, MD as PCP - Cardiology (Cardiology) Lavonna Monarch, MD (Inactive) as Consulting Physician (Dermatology)  Indicate any recent Medical Services you may have received from other than [facility] providers in the past year (date may be approximate).     Assessment:   This is a routine wellness examination for [name] [name].  Hearing/Vision screen Hearing Screening - Comments:: Wears aids Vision Screening - Comments:: Wears glasses- Dr.Groat  Dietary issues and exercise activities discussed: Current Exercise Habits: Home exercise routine, Type of exercise: walking, Time (Minutes): 20, Frequency (Times/Week): 2, Weekly Exercise (Minutes/Week): 40, Intensity: Mild   Goals Addressed             This Visit's Progress    DIET - EAT MORE FRUITS AND VEGETABLES         Depression Screen    03/18/2022   10:22 AM 03/03/2022   11:25 AM 12/30/2021    4:04  PM 12/22/2020    7:41 AM 10/23/2020   10:14 AM 08/08/2020   12:47 PM 06/25/2020   10:42 AM  PHQ 2/9 Scores  PHQ - 2 Score 0 1 0 0 0 3 0  PHQ- 9 Score 0   4  6     Fall Risk    03/18/2022   10:24 AM 03/03/2022   11:25 AM 12/30/2021    4:04 PM 10/28/2021    9:47 AM 10/23/2020   10:14 AM  Fall Risk   Falls in the past year? 0 0 0 0 0  Number falls in past yr: 0 0  0   Injury with Fall? 0 0 0 0   Risk for fall due to : No Fall Risks No Fall Risks No Fall Risks No Fall Risks   Follow up Falls prevention discussed;Falls evaluation completed Falls  evaluation completed Falls evaluation completed Falls evaluation completed Falls evaluation completed    FALL RISK PREVENTION PERTAINING TO THE HOME:  Any stairs in or around the home? Yes  If so, are there any without handrails? No  Home free of loose throw rugs in walkways, pet beds, electrical cords, etc? Yes  Adequate lighting in your home to reduce risk of falls? Yes   ASSISTIVE DEVICES UTILIZED TO PREVENT FALLS:  Life alert? No  Use of a cane, walker or w/c? No  Grab bars in the bathroom? No  Shower chair or bench in shower? Yes  Elevated toilet seat or a handicapped toilet? No   Cognitive Function:        03/18/2022   10:25 AM  6CIT Screen  What Year? 0 points  What month? 0 points  What time? 0 points  Count back from 20 0 points  Months in reverse 0 points  Repeat phrase 0 points  Total Score 0 points    Immunizations Immunization History  Administered Date(s) Administered   Influenza,inj,Quad PF,6+ Mos 03/22/2014, 04/21/2015, 02/13/2019   Influenza-Unspecified 02/18/2017, 03/23/2018, 04/07/2020, 02/15/2021   Moderna Sars-Covid-2 Vaccination 06/20/2019, 07/18/2019, 04/14/2020, 04/20/2021   Pneumococcal Conjugate-13 09/24/2016   Pneumococcal Polysaccharide-23 05/08/2013   Pneumococcal-Unspecified 10/24/2006   Zoster Recombinat (Shingrix) 08/26/2016, 02/05/2017    TDAP status: Due, Education has been provided regarding the importance of this vaccine. Advised may receive this vaccine at local pharmacy or Health Dept. Aware to provide a copy of the vaccination record if obtained from local pharmacy or Health Dept. Verbalized acceptance and understanding.  Flu Vaccine status: Up to date  Pneumococcal vaccine status: Up to date  Covid-19 vaccine status: Completed vaccines  Qualifies for Shingles Vaccine? Yes   Zostavax completed No   Shingrix Completed?: Yes  Screening Tests Health Maintenance  Topic Date Due   TETANUS/TDAP  Never done   COVID-19  Vaccine (5 - Moderna risk series) 06/15/2021   INFLUENZA VACCINE  12/15/2021   Medicare Annual Wellness (AWV)  03/19/2023   Pneumonia Vaccine 70+ Years old  Completed   Zoster Vaccines- Shingrix  Completed   HPV VACCINES  Aged Out    Health Maintenance  Health Maintenance Due  Topic Date Due   TETANUS/TDAP  Never done   COVID-19 Vaccine (5 - Moderna risk series) 06/15/2021   INFLUENZA VACCINE  12/15/2021    Colorectal cancer screening: No longer required.   Lung Cancer Screening: (Low Dose CT Chest recommended if Age 49-80 years, 30 pack-year currently smoking OR have quit w/in 15years.) does not qualify.   Additional Screening:  Hepatitis C Screening: does not qualify;  Completed no  Vision Screening: Recommended annual ophthalmology exams for early detection of glaucoma and other disorders of the eye. Is the patient up to date with their annual eye exam?  Yes  Who is the provider or what is the name of the office in which the patient attends annual eye exams? Dr.Groat If pt is not established with a provider, would they like to be referred to a provider to establish care? No .   Dental Screening: Recommended annual dental exams for proper oral hygiene  Community Resource Referral / Chronic Care Management: CRR required this visit?  No   CCM required this visit?  No      Plan:     I have personally reviewed and noted the following in the patient's chart:   Medical and social history Use of alcohol, tobacco or illicit drugs  Current medications and supplements including opioid prescriptions. Patient is not currently taking opioid prescriptions. Functional ability and status Nutritional status Physical activity Advanced directives List of other physicians Hospitalizations, surgeries, and ER visits in previous 12 months Vitals Screenings to include cognitive, depression, and falls Referrals and appointments  In addition, I have reviewed and discussed with patient  certain preventive protocols, quality metrics, and best practice recommendations. A written personalized care plan for preventive services as well as general preventive health recommendations were provided to patient.     [name] David, LPN   [id number]   Nurse Notes: none

## 2022-03-18 NOTE — Patient Instructions (Signed)
[name] [name] , Thank you for taking time to come for your Medicare Wellness Visit. I appreciate your ongoing commitment to your health goals. Please review the following plan we discussed and let me know if I can assist you in the future.   Screening recommendations/referrals: Colonoscopy: aged out Recommended yearly ophthalmology/optometry visit for glaucoma screening and checkup Recommended yearly dental visit for hygiene and checkup  Vaccinations: Influenza vaccine: 02/15/21 Pneumococcal vaccine: 09/24/16 Tdap vaccine: n/d Shingles vaccine: Shingrix 08/26/16, 02/05/17   Covid-19: 06/20/19, 07/18/19, 04/14/20, 04/20/21  Advanced directives: no  Conditions/risks identified: none  Next appointment: Follow up in one year for your annual wellness visit. 03/29/23 @ 3:40 pm by phone  Preventive Care 65 Years and Older, Male Preventive care refers to lifestyle choices and visits with your health care provider that can promote health and wellness. What does preventive care include? A yearly physical exam. This is also called an annual well check. Dental exams once or twice a year. Routine eye exams. Ask your health care provider how often you should have your eyes checked. Personal lifestyle choices, including: Daily care of your teeth and gums. Regular physical activity. Eating a healthy diet. Avoiding tobacco and drug use. Limiting alcohol use. Practicing safe sex. Taking low doses of aspirin every day. Taking vitamin and mineral supplements as recommended by your health care provider. What happens during an annual well check? The services and screenings done by your health care provider during your annual well check will depend on your age, overall health, lifestyle risk factors, and family history of disease. Counseling  Your health care provider may ask you questions about your: Alcohol use. Tobacco use. Drug use. Emotional well-being. Home and relationship well-being. Sexual  activity. Eating habits. History of falls. Memory and ability to understand (cognition). Work and work Statistician. Screening  You may have the following tests or measurements: Height, weight, and BMI. Blood pressure. Lipid and cholesterol levels. These may be checked every 5 years, or more frequently if you are over 90 years old. Skin check. Lung cancer screening. You may have this screening every year starting at age 83 if you have a 30-pack-year history of smoking and currently smoke or have quit within the past 15 years. Fecal occult blood test (FOBT) of the stool. You may have this test every year starting at age 65. Flexible sigmoidoscopy or colonoscopy. You may have a sigmoidoscopy every 5 years or a colonoscopy every 10 years starting at age 103. Prostate cancer screening. Recommendations will vary depending on your family history and other risks. Hepatitis C blood test. Hepatitis B blood test. Sexually transmitted disease (STD) testing. Diabetes screening. This is done by checking your blood sugar (glucose) after you have not eaten for a while (fasting). You may have this done every 1-3 years. Abdominal aortic aneurysm (AAA) screening. You may need this if you are a current or former smoker. Osteoporosis. You may be screened starting at age 76 if you are at high risk. Talk with your health care provider about your test results, treatment options, and if necessary, the need for more tests. Vaccines  Your health care provider may recommend certain vaccines, such as: Influenza vaccine. This is recommended every year. Tetanus, diphtheria, and acellular pertussis (Tdap, Td) vaccine. You may need a Td booster every 10 years. Zoster vaccine. You may need this after age 35. Pneumococcal 13-valent conjugate (PCV13) vaccine. One dose is recommended after age 70. Pneumococcal polysaccharide (PPSV23) vaccine. One dose is recommended after age 82. Talk  to your health care provider about which  screenings and vaccines you need and how often you need them. This information is not intended to replace advice given to you by your health care provider. Make sure you discuss any questions you have with your health care provider. Document Released: 05/30/2015 Document Revised: 01/21/2016 Document Reviewed: 03/04/2015 Elsevier Interactive Patient Education  2017 [place] Prevention in the Home Falls can cause injuries. They can happen to people of all ages. There are many things you can do to make your home safe and to help prevent falls. What can I do on the outside of my home? Regularly fix the edges of walkways and driveways and fix any cracks. Remove anything that might make you trip as you walk through a door, such as a raised step or threshold. Trim any bushes or trees on the path to your home. Use bright outdoor lighting. Clear any walking paths of anything that might make someone trip, such as rocks or tools. Regularly check to see if handrails are loose or broken. Make sure that both sides of any steps have handrails. Any raised decks and porches should have guardrails on the edges. Have any leaves, snow, or ice cleared regularly. Use sand or salt on walking paths during winter. Clean up any spills in your garage right away. This includes oil or grease spills. What can I do in the bathroom? Use night lights. Install grab bars by the toilet and in the tub and shower. Do not use towel bars as grab bars. Use non-skid mats or decals in the tub or shower. If you need to sit down in the shower, use a plastic, non-slip stool. Keep the floor dry. Clean up any water that spills on the floor as soon as it happens. Remove soap buildup in the tub or shower regularly. Attach bath mats securely with double-sided non-slip rug tape. Do not have throw rugs and other things on the floor that can make you trip. What can I do in the bedroom? Use night lights. Make sure that you have a  light by your bed that is easy to reach. Do not use any sheets or blankets that are too big for your bed. They should not hang down onto the floor. Have a firm chair that has side arms. You can use this for support while you get dressed. Do not have throw rugs and other things on the floor that can make you trip. What can I do in the kitchen? Clean up any spills right away. Avoid walking on wet floors. Keep items that you use a lot in easy-to-reach places. If you need to reach something above you, use a strong step stool that has a grab bar. Keep electrical cords out of the way. Do not use floor polish or wax that makes floors slippery. If you must use wax, use non-skid floor wax. Do not have throw rugs and other things on the floor that can make you trip. What can I do with my stairs? Do not leave any items on the stairs. Make sure that there are handrails on both sides of the stairs and use them. Fix handrails that are broken or loose. Make sure that handrails are as long as the stairways. Check any carpeting to make sure that it is firmly attached to the stairs. Fix any carpet that is loose or worn. Avoid having throw rugs at the top or bottom of the stairs. If you do have throw rugs,  attach them to the floor with carpet tape. Make sure that you have a light switch at the top of the stairs and the bottom of the stairs. If you do not have them, ask someone to add them for you. What else can I do to help prevent falls? Wear shoes that: Do not have high heels. Have rubber bottoms. Are comfortable and fit you well. Are closed at the toe. Do not wear sandals. If you use a stepladder: Make sure that it is fully opened. Do not climb a closed stepladder. Make sure that both sides of the stepladder are locked into place. Ask someone to hold it for you, if possible. Clearly mark and make sure that you can see: Any grab bars or handrails. First and last steps. Where the edge of each step  is. Use tools that help you move around (mobility aids) if they are needed. These include: Canes. Walkers. Scooters. Crutches. Turn on the lights when you go into a dark area. Replace any light bulbs as soon as they burn out. Set up your furniture so you have a clear path. Avoid moving your furniture around. If any of your floors are uneven, fix them. If there are any pets around you, be aware of where they are. Review your medicines with your doctor. Some medicines can make you feel dizzy. This can increase your chance of falling. Ask your doctor what other things that you can do to help prevent falls. This information is not intended to replace advice given to you by your health care provider. Make sure you discuss any questions you have with your health care provider. Document Released: 02/27/2009 Document Revised: 10/09/2015 Document Reviewed: 06/07/2014 Elsevier Interactive Patient Education  2017 [employer].

## 2022-03-25 DIAGNOSIS — Z23 Encounter for immunization: Secondary | ICD-10-CM | POA: Diagnosis not present

## 2022-03-30 ENCOUNTER — Ambulatory Visit (INDEPENDENT_AMBULATORY_CARE_PROVIDER_SITE_OTHER): Payer: Medicare Other | Admitting: Family Medicine

## 2022-03-30 ENCOUNTER — Encounter: Payer: Self-pay | Admitting: Family Medicine

## 2022-03-30 VITALS — BP 139/75 | HR 78 | Temp 97.6°F | Wt 208.2 lb

## 2022-03-30 DIAGNOSIS — I2510 Atherosclerotic heart disease of native coronary artery without angina pectoris: Secondary | ICD-10-CM | POA: Diagnosis not present

## 2022-03-30 DIAGNOSIS — R600 Localized edema: Secondary | ICD-10-CM

## 2022-03-30 NOTE — Patient Instructions (Signed)
I will call with [place] results.  Take care  Dr. Lacinda Axon

## 2022-03-31 ENCOUNTER — Ambulatory Visit (HOSPITAL_COMMUNITY)
Admission: RE | Admit: 2022-03-31 | Discharge: 2022-03-31 | Disposition: A | Discharge status: Home / Self Care | Payer: Medicare Other | Source: Ambulatory Visit | Attending: Family Medicine | Admitting: Family Medicine

## 2022-03-31 DIAGNOSIS — R600 Localized edema: Secondary | ICD-10-CM | POA: Insufficient documentation

## 2022-03-31 NOTE — Progress Notes (Signed)
Subjective:  Patient ID: [name] [name], male    DOB: Jun 03, 1940  Age: 81 y.o. MRN: [id number]  CC: Chief Complaint  Patient presents with   Leg Swelling    Left leg began having pain about 2 hours ago along with intermittent tightness. Pt states he has swelling in feet and ankles that did go down at night but here recently it has not.     HPI:  81 year old male with an extensive past medical history presents for evaluation of the above.  Patient has had ongoing swelling in his lower extremities.  Improves with elevation.  Over the past 1.5 weeks, he has noticed persistent swelling in the lower extremities despite elevation.  He has, as of today, had some pain in the left calf.  No recent fall, trauma, injury.  Shortness of breath is at baseline.  No relieving factors.  Patient Active Problem List   Diagnosis Date Noted   Bilateral lower extremity edema 03/31/2022   DDD (degenerative disc disease), cervical 03/17/2022   Degeneration of lumbar intervertebral disc 03/17/2022   Irritable bowel syndrome 03/17/2022   Psoriasis 03/17/2022   Inflammatory arthritis 03/17/2022   Osteoarthritis 03/17/2022   Right lower quadrant abdominal pain 03/03/2022   Viral illness 02/21/2022   Bilateral sensorineural hearing loss 02/09/2022   Olecranon bursitis 01/12/2022   Osteoarthritis of right knee 12/06/2021   Seasonal allergies 08/06/2021   COPD (chronic obstructive pulmonary disease) ([place]) 04/23/2021   BPH (benign prostatic hyperplasia) 04/23/2021   CAD (coronary artery disease) 04/23/2021   Hypertension 09/16/2016   Lumbar spondylosis 11/05/2014   GERD (gastroesophageal reflux disease) 01/19/2012   Hyperlipidemia 04/15/2008   AAA (abdominal aortic aneurysm) ([place]) 04/15/2008   Old myocardial infarction 1999    Social Hx   Social History   Socioeconomic History   Marital status: Married    Spouse name: Not on file   Number of children: Not on file   Years of education: Not on file    Highest education level: Not on file  Occupational History   Not on file  Tobacco Use   Smoking status: Former    Packs/day: 1.00    Years: 35.00    Total pack years: 35.00    Types: Cigarettes    Quit date: 03/28/2003    Years since quitting: 19.0   Smokeless tobacco: Never  Vaping Use   Vaping Use: Never used  Substance and Sexual Activity   Alcohol use: No    Alcohol/week: 0.0 standard drinks of alcohol   Drug use: No   Sexual activity: Not Currently    Birth control/protection: None  Other Topics Concern   Not on file  Social History Narrative   Not on file   Social Determinants of Health   Financial Resource Strain: Low Risk  (03/18/2022)   Overall Financial Resource Strain (CARDIA)    Difficulty of Paying Living Expenses: Not very hard  Food Insecurity: No Food Insecurity (03/18/2022)   Hunger Vital Sign    Worried About Running Out of Food in the Last Year: Never true    Ran Out of Food in the Last Year: Never true  Transportation Needs: No Transportation Needs (03/18/2022)   PRAPARE - Hydrologist (Medical): No    Lack of Transportation (Non-Medical): No  Physical Activity: Insufficiently Active (03/18/2022)   Exercise Vital Sign    Days of Exercise per Week: 2 days    Minutes of Exercise per Session: 20 min  Stress:  No Stress Concern Present (03/18/2022)   [place]    Feeling of Stress : Not at all  Social Connections: Socially Isolated (03/18/2022)   Social Connection and Isolation Panel [NHANES]    Frequency of Communication with Friends and Family: More than three times a week    Frequency of Social Gatherings with Friends and Family: Three times a week    Attends Religious Services: Never    Active Member of Clubs or Organizations: No    Attends Archivist Meetings: Never    Marital Status: Widowed    Review of Systems Per HPI  Objective:  BP  139/75   Pulse 78   Temp 97.6 F (36.4 C)   Wt 208 lb 3.2 oz (94.4 kg)   SpO2 96%   BMI 27.47 kg/m      03/30/2022    3:55 PM 03/18/2022   10:30 AM 03/03/2022   11:26 AM  BP/Weight  Systolic BP 683  419  Diastolic BP 75  78  Wt. (Lbs) 208.2 203 203.8  BMI 27.47 kg/m2 26.78 kg/m2 26.89 kg/m2    Physical Exam Vitals and nursing note reviewed.  Constitutional:      Appearance: Normal appearance.  HENT:     Head: Normocephalic and atraumatic.  Cardiovascular:     Rate and Rhythm: Normal rate and regular rhythm.  Pulmonary:     Effort: Pulmonary effort is normal.     Breath sounds: Normal breath sounds. No wheezing, rhonchi or rales.  Musculoskeletal:     Comments: Bilateral pitting foot and ankle edema.  Varicosities noted.  No tenderness of the left calf.  Negative Homans' sign.  Neurological:     Mental Status: He is alert.  Psychiatric:        Mood and Affect: Mood normal.        Behavior: Behavior normal.     Lab Results  Component Value Date   WBC 7.8 03/03/2022   HGB 13.2 03/03/2022   HCT 41.3 03/03/2022   PLT 229 03/03/2022   GLUCOSE 99 03/03/2022   CHOL 124 04/23/2021   TRIG 109 04/23/2021   HDL 39 (L) 04/23/2021   LDLCALC 65 04/23/2021   ALT 16 03/03/2022   AST 19 03/03/2022   NA 139 03/03/2022   K 4.1 03/03/2022   CL 105 03/03/2022   CREATININE 0.90 03/03/2022   BUN 13 03/03/2022   CO2 29 03/03/2022   TSH 1.230 02/14/2019   PSA 1.80 07/26/2014   HGBA1C 6.1 (H) 04/23/2021     Assessment & Plan:   Problem List Items Addressed This Visit       Other   Bilateral lower extremity edema - Primary    I believe that this is from venous stasis.  However, given recent change we will proceed with lower extremity ultrasound to ensure no underlying DVT.      Relevant Orders   US Venous Img Lower Bilateral    Follow-up:  Pending work up.   [place]

## 2022-03-31 NOTE — Assessment & Plan Note (Signed)
I believe that this is from venous stasis.  However, given recent change we will proceed with lower extremity ultrasound to ensure no underlying DVT.

## 2022-04-01 DIAGNOSIS — M25561 Pain in right knee: Secondary | ICD-10-CM | POA: Diagnosis not present

## 2022-04-01 DIAGNOSIS — M1711 Unilateral primary osteoarthritis, right knee: Secondary | ICD-10-CM | POA: Diagnosis not present

## 2022-04-05 ENCOUNTER — Other Ambulatory Visit: Payer: Self-pay | Admitting: Family Medicine

## 2022-04-05 DIAGNOSIS — E785 Hyperlipidemia, unspecified: Secondary | ICD-10-CM

## 2022-04-06 ENCOUNTER — Ambulatory Visit (INDEPENDENT_AMBULATORY_CARE_PROVIDER_SITE_OTHER): Payer: Medicare Other | Admitting: Family Medicine

## 2022-04-06 VITALS — BP 125/77 | HR 77 | Temp 97.7°F | Ht 73.0 in | Wt 208.4 lb

## 2022-04-06 DIAGNOSIS — R21 Rash and other nonspecific skin eruption: Secondary | ICD-10-CM | POA: Diagnosis not present

## 2022-04-06 DIAGNOSIS — I2510 Atherosclerotic heart disease of native coronary artery without angina pectoris: Secondary | ICD-10-CM

## 2022-04-06 MED ORDER — PREDNISONE 10 MG (21) PO TBPK: ORAL_TABLET | ORAL | 0 refills | Status: DC

## 2022-04-06 NOTE — Progress Notes (Signed)
Subjective:  Patient ID: [name] [name], male    DOB: [birth year]  Age: 81 y.o. MRN: [id number]  CC: Chief Complaint  Patient presents with   Urticaria    Patient c/o of hives on back an shoulders. Started last Friday.    HPI:  81 year old male with the below mentioned PMH presents for evaluation of rash.  Patient reports that he has had a rash since Friday.  Patient reports an itchy rash on his back and trunk.  No new changes or exposures.  No new medications.  He is unsure of any inciting factor.  He has been using Benadryl without relief.  Continues daily antihistamine.  No other complaints or concerns at this time.  Patient Active Problem List   Diagnosis Date Noted   Rash 04/06/2022   Bilateral lower extremity edema 03/31/2022   Irritable bowel syndrome 03/17/2022   Right lower quadrant abdominal pain 03/03/2022   Olecranon bursitis 01/12/2022   Osteoarthritis of right knee 12/06/2021   Seasonal allergies 08/06/2021   COPD (chronic obstructive pulmonary disease) ([place]) 04/23/2021   BPH (benign prostatic hyperplasia) 04/23/2021   CAD (coronary artery disease) 04/23/2021   Hypertension 09/16/2016   Lumbar spondylosis 11/05/2014   GERD (gastroesophageal reflux disease) 01/19/2012   Hyperlipidemia 04/15/2008   AAA (abdominal aortic aneurysm) ([place]) 04/15/2008    Social Hx   Social History   Socioeconomic History   Marital status: Married    Spouse name: Not on file   Number of children: Not on file   Years of education: Not on file   Highest education level: Not on file  Occupational History   Not on file  Tobacco Use   Smoking status: Former    Packs/day: 1.00    Years: 35.00    Total pack years: 35.00    Types: Cigarettes    Quit date: 03/28/2003    Years since quitting: 19.0   Smokeless tobacco: Never  Vaping Use   Vaping Use: Never used  Substance and Sexual Activity   Alcohol use: No    Alcohol/week: 0.0 standard drinks of alcohol   Drug use: No    Sexual activity: Not Currently    Birth control/protection: None  Other Topics Concern   Not on file  Social History Narrative   Not on file   Social Determinants of Health   Financial Resource Strain: Low Risk  (03/18/2022)   Overall Financial Resource Strain (CARDIA)    Difficulty of Paying Living Expenses: Not very hard  Food Insecurity: No Food Insecurity (03/18/2022)   Hunger Vital Sign    Worried About Running Out of Food in the Last Year: Never true    Ran Out of Food in the Last Year: Never true  Transportation Needs: No Transportation Needs (03/18/2022)   PRAPARE - Hydrologist (Medical): No    Lack of Transportation (Non-Medical): No  Physical Activity: Insufficiently Active (03/18/2022)   Exercise Vital Sign    Days of Exercise per Week: 2 days    Minutes of Exercise per Session: 20 min  Stress: No Stress Concern Present (03/18/2022)   [place]    Feeling of Stress : Not at all  Social Connections: Socially Isolated (03/18/2022)   Social Connection and Isolation Panel [NHANES]    Frequency of Communication with Friends and Family: More than three times a week    Frequency of Social Gatherings with Friends and Family: Three  times a week    Attends Religious Services: Never    Active Member of Clubs or Organizations: No    Attends Archivist Meetings: Never    Marital Status: Widowed    Review of Systems Per HPI  Objective:  BP 125/77   Pulse 77   Temp 97.7 F (36.5 C) (Oral)   Ht '6\' 1"'$  (1.854 m)   Wt 208 lb 6.4 oz (94.5 kg)   SpO2 96%   BMI 27.50 kg/m      04/06/2022   11:28 AM 03/30/2022    3:55 PM 03/18/2022   10:30 AM  BP/Weight  Systolic BP 427 062   Diastolic BP 77 75   Wt. (Lbs) 208.4 208.2 203  BMI 27.5 kg/m2 27.47 kg/m2 26.78 kg/m2    Physical Exam Constitutional:      General: He is not in acute distress.    Appearance: Normal  appearance.  HENT:     Head: Normocephalic and atraumatic.  Pulmonary:     Effort: Pulmonary effort is normal. No respiratory distress.  Musculoskeletal:     Comments: Raised, erythematous rash noted on the back as well as the chest and abdomen.  Neurological:     Mental Status: He is alert.  Psychiatric:        Mood and Affect: Mood normal.        Behavior: Behavior normal.     Lab Results  Component Value Date   WBC 7.8 03/03/2022   HGB 13.2 03/03/2022   HCT 41.3 03/03/2022   PLT 229 03/03/2022   GLUCOSE 99 03/03/2022   CHOL 124 04/23/2021   TRIG 109 04/23/2021   HDL 39 (L) 04/23/2021   LDLCALC 65 04/23/2021   ALT 16 03/03/2022   AST 19 03/03/2022   NA 139 03/03/2022   K 4.1 03/03/2022   CL 105 03/03/2022   CREATININE 0.90 03/03/2022   BUN 13 03/03/2022   CO2 29 03/03/2022   TSH 1.230 02/14/2019   PSA 1.80 07/26/2014   HGBA1C 6.1 (H) 04/23/2021     Assessment & Plan:   Problem List Items Addressed This Visit       Musculoskeletal and Integument   Rash - Primary    Concern for allergic or irritant dermatitis.  Treating with prednisone.  If fails to improve or worsens, will follow-up with dermatology.       Meds ordered this encounter  Medications   predniSONE (STERAPRED UNI-PAK 21 TAB) 10 MG (21) TBPK tablet    Sig: 6 tablets on day 1; decrease by 1 tablet daily until gone.    Dispense:  21 tablet    Refill:  0    Follow-up:  Return if symptoms worsen or fail to improve.  [place]

## 2022-04-06 NOTE — Assessment & Plan Note (Signed)
Concern for allergic or irritant dermatitis.  Treating with prednisone.  If fails to improve or worsens, will follow-up with dermatology.

## 2022-04-06 NOTE — Patient Instructions (Signed)
Medication as prescribed.  If continues to persist, see Dermatology.  Take care  Dr. Lacinda Axon

## 2022-04-09 ENCOUNTER — Telehealth: Payer: Self-pay | Admitting: Pulmonary Disease

## 2022-04-09 DIAGNOSIS — J449 Chronic obstructive pulmonary disease, unspecified: Secondary | ICD-10-CM

## 2022-04-09 NOTE — Telephone Encounter (Signed)
Order for pt to receive a new nebulizer from [facility] in [place]. Called and spoke with pt letting him know this had been done and he verbalized understanding. Nothing further needed.

## 2022-04-09 NOTE — Telephone Encounter (Signed)
Called and spoke with pt who states his nebulizer machine has broken and stated that he needs to have an order sent to [facility] in [place] as where he normally gets this from, they are out.  With Dr. Jenetta Downer not being in the office and since pt has seen Katie before, sending this to her to see is she is okay with [place] ordering pt a new nebulizer under her.

## 2022-04-09 NOTE — Telephone Encounter (Signed)
Fine to order new neb machine. Thanks.

## 2022-04-13 ENCOUNTER — Other Ambulatory Visit: Payer: Self-pay | Admitting: Family Medicine

## 2022-04-13 DIAGNOSIS — G4700 Insomnia, unspecified: Secondary | ICD-10-CM

## 2022-04-13 DIAGNOSIS — I10 Essential (primary) hypertension: Secondary | ICD-10-CM

## 2022-04-14 DIAGNOSIS — M170 Bilateral primary osteoarthritis of knee: Secondary | ICD-10-CM | POA: Diagnosis not present

## 2022-04-14 DIAGNOSIS — R269 Unspecified abnormalities of gait and mobility: Secondary | ICD-10-CM | POA: Diagnosis not present

## 2022-04-15 DIAGNOSIS — H5051 Esophoria: Secondary | ICD-10-CM | POA: Diagnosis not present

## 2022-04-15 DIAGNOSIS — H3581 Retinal edema: Secondary | ICD-10-CM | POA: Diagnosis not present

## 2022-04-15 DIAGNOSIS — H532 Diplopia: Secondary | ICD-10-CM | POA: Diagnosis not present

## 2022-04-22 ENCOUNTER — Encounter: Payer: Self-pay | Admitting: Family Medicine

## 2022-04-22 ENCOUNTER — Other Ambulatory Visit (HOSPITAL_COMMUNITY)
Admission: RE | Admit: 2022-04-22 | Discharge: 2022-04-22 | Disposition: A | Discharge status: Home / Self Care | Payer: Medicare Other | Source: Ambulatory Visit | Attending: Family Medicine | Admitting: Family Medicine

## 2022-04-22 ENCOUNTER — Ambulatory Visit (INDEPENDENT_AMBULATORY_CARE_PROVIDER_SITE_OTHER): Payer: Medicare Other | Admitting: Family Medicine

## 2022-04-22 ENCOUNTER — Ambulatory Visit (HOSPITAL_COMMUNITY)
Admission: RE | Admit: 2022-04-22 | Discharge: 2022-04-22 | Disposition: A | Discharge status: Home / Self Care | Payer: Medicare Other | Source: Ambulatory Visit | Attending: Family Medicine | Admitting: Family Medicine

## 2022-04-22 VITALS — BP 94/65 | HR 86 | Wt 203.6 lb

## 2022-04-22 DIAGNOSIS — R5383 Other fatigue: Secondary | ICD-10-CM | POA: Diagnosis not present

## 2022-04-22 DIAGNOSIS — H532 Diplopia: Secondary | ICD-10-CM | POA: Insufficient documentation

## 2022-04-22 DIAGNOSIS — I2510 Atherosclerotic heart disease of native coronary artery without angina pectoris: Secondary | ICD-10-CM | POA: Diagnosis not present

## 2022-04-22 DIAGNOSIS — I10 Essential (primary) hypertension: Secondary | ICD-10-CM

## 2022-04-22 LAB — CBC
HCT: 43.1 % (ref 39.0–52.0)
Hemoglobin: 13.5 g/dL (ref 13.0–17.0)
MCH: 27.4 pg (ref 26.0–34.0)
MCHC: 31.3 g/dL (ref 30.0–36.0)
MCV: 87.4 fL (ref 80.0–100.0)
Platelets: 213 10*3/uL (ref 150–400)
RBC: 4.93 MIL/uL (ref 4.22–5.81)
RDW: 13.5 % (ref 11.5–15.5)
WBC: 8.2 10*3/uL (ref 4.0–10.5)
nRBC: 0 % (ref 0.0–0.2)

## 2022-04-22 LAB — BASIC METABOLIC PANEL
Anion gap: 6 (ref 5–15)
BUN: 14 mg/dL (ref 8–23)
CO2: 28 mmol/L (ref 22–32)
Calcium: 9.2 mg/dL (ref 8.9–10.3)
Chloride: 103 mmol/L (ref 98–111)
Creatinine, Ser: 0.92 mg/dL (ref 0.61–1.24)
GFR, Estimated: >60 mL/min (ref >60)
Glucose, Bld: 102 mg/dL — ABNORMAL HIGH (ref 70–99)
Potassium: 4.5 mmol/L (ref 3.5–5.1)
Sodium: 137 mmol/L (ref 135–145)

## 2022-04-22 NOTE — Progress Notes (Signed)
Subjective:  Patient ID: [name] [name], male    DOB: 05/28/1940  Age: 81 y.o. MRN: [id number]  CC: Chief Complaint  Patient presents with   Dizziness    Pt states he doesn't feel right. Lightheaded, dizzy, eyes not coordinated (double vision). Sleeping more; even during the day(not normal for pt) Seen eye dr last week-didn't help much. Vision still not better. Going on about 2-2.5 weeks    HPI:  81 year old male with an extensive past medical history as outlined below presents with the above complaint.  Patient reports over the past 2-2.5 weeks he has had issues with his vision.  He reports double vision.  He states that he is also had ongoing lightheadedness/dizziness and feeling fatigued and having a decrease in energy.  He saw his ophthalmologist and it was suggested that he see a neurologist.  There were no comments about abnormal findings to suggest the culprit of his double vision.  Patient's blood pressure has been running low as well.  This is unusual for him.  He is on losartan.  Patient overall does not feel well and is very concerned.  Patient Active Problem List   Diagnosis Date Noted   Diplopia 04/22/2022   Fatigue 04/22/2022   Bilateral lower extremity edema 03/31/2022   Irritable bowel syndrome 03/17/2022   Olecranon bursitis 01/12/2022   Osteoarthritis of right knee 12/06/2021   Seasonal allergies 08/06/2021   COPD (chronic obstructive pulmonary disease) ([place]) 04/23/2021   BPH (benign prostatic hyperplasia) 04/23/2021   CAD (coronary artery disease) 04/23/2021   Hypertension 09/16/2016   Lumbar spondylosis 11/05/2014   GERD (gastroesophageal reflux disease) 01/19/2012   Hyperlipidemia 04/15/2008   AAA (abdominal aortic aneurysm) ([place]) 04/15/2008    Social Hx   Social History   Socioeconomic History   Marital status: Married    Spouse name: Not on file   Number of children: Not on file   Years of education: Not on file   Highest education level: Not on file   Occupational History   Not on file  Tobacco Use   Smoking status: Former    Packs/day: 1.00    Years: 35.00    Total pack years: 35.00    Types: Cigarettes    Quit date: 03/28/2003    Years since quitting: 19.0   Smokeless tobacco: Never  Vaping Use   Vaping Use: Never used  Substance and Sexual Activity   Alcohol use: No    Alcohol/week: 0.0 standard drinks of alcohol   Drug use: No   Sexual activity: Not Currently    Birth control/protection: None  Other Topics Concern   Not on file  Social History Narrative   Not on file   Social Determinants of Health   Financial Resource Strain: Low Risk  (03/18/2022)   Overall Financial Resource Strain (CARDIA)    Difficulty of Paying Living Expenses: Not very hard  Food Insecurity: No Food Insecurity (03/18/2022)   Hunger Vital Sign    Worried About Running Out of Food in the Last Year: Never true    Ran Out of Food in the Last Year: Never true  Transportation Needs: No Transportation Needs (03/18/2022)   PRAPARE - Hydrologist (Medical): No    Lack of Transportation (Non-Medical): No  Physical Activity: Insufficiently Active (03/18/2022)   Exercise Vital Sign    Days of Exercise per Week: 2 days    Minutes of Exercise per Session: 20 min  Stress: No Stress Concern  Present (03/18/2022)   [place]    Feeling of Stress : Not at all  Social Connections: Socially Isolated (03/18/2022)   Social Connection and Isolation Panel [NHANES]    Frequency of Communication with Friends and Family: More than three times a week    Frequency of Social Gatherings with Friends and Family: Three times a week    Attends Religious Services: Never    Active Member of Clubs or Organizations: No    Attends Archivist Meetings: Never    Marital Status: Widowed    Review of Systems Per HPI  Objective:  BP 94/65   Pulse 86   Wt 203 lb 9.6 oz  (92.4 kg)   SpO2 96%   BMI 26.86 kg/m      04/22/2022   10:12 AM 04/06/2022   11:28 AM 03/30/2022    3:55 PM  BP/Weight  Systolic BP 94 659 935  Diastolic BP 65 77 75  Wt. (Lbs) 203.6 208.4 208.2  BMI 26.86 kg/m2 27.5 kg/m2 27.47 kg/m2    Physical Exam Vitals and nursing note reviewed.  Constitutional:      General: He is not in acute distress.    Appearance: Normal appearance.  HENT:     Head: Normocephalic and atraumatic.  Eyes:     Comments: Anisocoria noted with the left pupil being larger than the right.  Pupils are sluggish.  Cardiovascular:     Rate and Rhythm: Normal rate and regular rhythm.  Pulmonary:     Effort: Pulmonary effort is normal.     Breath sounds: Normal breath sounds.  Neurological:     Mental Status: He is alert.  Psychiatric:        Mood and Affect: Mood normal.        Behavior: Behavior normal.     Lab Results  Component Value Date   WBC 8.2 04/22/2022   HGB 13.5 04/22/2022   HCT 43.1 04/22/2022   PLT 213 04/22/2022   GLUCOSE 102 (H) 04/22/2022   CHOL 124 04/23/2021   TRIG 109 04/23/2021   HDL 39 (L) 04/23/2021   LDLCALC 65 04/23/2021   ALT 16 03/03/2022   AST 19 03/03/2022   NA 137 04/22/2022   K 4.5 04/22/2022   CL 103 04/22/2022   CREATININE 0.92 04/22/2022   BUN 14 04/22/2022   CO2 28 04/22/2022   TSH 1.230 02/14/2019   PSA 1.80 07/26/2014   HGBA1C 6.1 (H) 04/23/2021     Assessment & Plan:   Problem List Items Addressed This Visit       Cardiovascular and Mediastinum   Hypertension    BP low today.  Stopping losartan.  Labs today were unremarkable.      Relevant Orders   Basic Metabolic Panel     Other   Diplopia - Primary    Etiology and prognosis unclear at this time.  MRI of the brain was obtained today for further evaluation and was unremarkable.  Referring to ophthalmology.      Relevant Orders   MR Brain Wo Contrast (Completed)   Ambulatory referral to Ophthalmology   Fatigue    I believe that  hypertension may be playing a role.  Stopping losartan.      Relevant Orders   CBC   Follow-up:  Return if symptoms worsen or fail to improve.  [place]

## 2022-04-22 NOTE — Assessment & Plan Note (Signed)
Etiology and prognosis unclear at this time.  MRI of the brain was obtained today for further evaluation and was unremarkable.  Referring to ophthalmology.

## 2022-04-22 NOTE — Assessment & Plan Note (Signed)
BP low today.  Stopping losartan.  Labs today were unremarkable.

## 2022-04-22 NOTE — Patient Instructions (Signed)
Labs at the hospital.  Stop Losartan.  Arranging MRI.  We will call with results.

## 2022-04-22 NOTE — Assessment & Plan Note (Signed)
I believe that hypertension may be playing a role.  Stopping losartan.

## 2022-04-27 DIAGNOSIS — H5000 Unspecified esotropia: Secondary | ICD-10-CM | POA: Diagnosis not present

## 2022-04-27 DIAGNOSIS — H532 Diplopia: Secondary | ICD-10-CM | POA: Diagnosis not present

## 2022-04-27 DIAGNOSIS — H5022 Vertical strabismus, left eye: Secondary | ICD-10-CM | POA: Diagnosis not present

## 2022-04-27 LAB — HM DIABETES EYE EXAM

## 2022-04-29 ENCOUNTER — Ambulatory Visit: Payer: Medicare Other | Admitting: Family Medicine

## 2022-05-03 NOTE — Progress Notes (Signed)
Patient ID: [name] [name], male   DOB: 1940/11/13, 81 y.o.   MRN: [id number]      81 y.o. history of AAA, CAD pancreatitis PCI RCA 1999 with low risk myovue 2018. Clinically stable Intolerant to beta blockers with diarrhea . AAA 4.6 cm by [place] 05/12/20 but now 5.1 cm by [place] 12/30/21 Has seen Dr Donnetta Hutching and threshold for EVAR 5.5 cm   Wife passed a few  years ago of Pancreatic Cancer and only lasted 6 weeks form diagnosis Has daughter in [place]    02/11/20 had recurrent right posterior epistaxis requiring endoscopic control and cauterization   Had right hernia surgery with Dr Arnoldo Morale 03/28/20  Still with some pain/swelling  Had idiopathic pancreatitis 2022  Not a drinker has had GB removed Seen by Dr Paulita Fujita no etiology noted  No angina or cardiac complaints Former smoker quit in 2014 Doing cardiopulmonary rehab 2x/ week Has seen Dr Melvyn Novas in past CXR 05/26/20 NAD With Emphysema   He is active with some dyspnea from COPD Age and pulmonary status make him a poor candidate for open AAA repair AAA 5.1 cm on CTA 06/11/21 stable Has seen Dr Donnetta Hutching 06/2021 indicated stability and observation as plan with repeat imaging in 6 months   Seeing Dr Amedeo Kinsman for right knee arthritis  Sees [facility] for pulmonary on Advair   No abdominal pain or issues with AAA   He has had double vision for a few months MRI negative sees [facility] for ophthalmology and no definite Diagnosis yet  ARB d/c due to low BP   Some exertional dyspnea Likely from weight gain and COPD but discussed need for f/u myovue as last one was 5 years ago with known CAD and possible need for vascular surgery in near future    ROS: Denies fever, malais, weight loss, blurry vision, decreased visual acuity, cough, sputum, SOB, hemoptysis, pleuritic pain, palpitaitons, heartburn, abdominal pain, melena, lower extremity edema, claudication, or rash.  All other systems reviewed and negative  General: BP 110/72   Pulse 69   Ht '6\' 1"'$  (1.854 m)   Wt 204 lb 9.6  oz (92.8 kg)   SpO2 95%   BMI 26.99 kg/m   Affect appropriate Healthy:  appears stated age 81: hard of hearing Aides in place  Neck supple with no adenopathy JVP normal no bruits no thyromegaly Lungs Mild exp wheezing and good diaphragmatic motion Heart:  S1/S2 no murmur, no rub, gallop or click PMI normal Abdomen: benighn, BS positve, no tenderness, AAA palpable not tender  no bruit.  No HSM or HJR Post right hernia surgery repair but persistent  Umbilical hernia  Distal pulses intact with no bruits No edema Neuro non-focal Skin warm and dry No muscular weakness   Current Outpatient Medications  Medication Sig Dispense Refill   albuterol (PROVENTIL) (2.5 MG/3ML) 0.083% nebulizer solution Take 3 mLs (2.5 mg total) by nebulization every 6 (six) hours as needed for wheezing or shortness of breath. 360 mL 5   albuterol (VENTOLIN HFA) 108 (90 Base) MCG/ACT inhaler Inhale 1-2 puffs into the lungs every 6 (six) hours as needed for wheezing or shortness of breath. 18 g 5   alfuzosin (UROXATRAL) 10 MG 24 hr tablet Take 1 tablet (10 mg total) by mouth daily with breakfast. 90 tablet 3   cetirizine (ZYRTEC) 10 MG tablet Take 10 mg by mouth daily.     COMBIGAN 0.2-0.5 % ophthalmic solution Apply 1 drop to eye 2 (two) times daily.  desonide (DESOWEN) 0.05 % cream Apply topically 2 (two) times daily.     esomeprazole (NEXIUM) 20 MG capsule Take by mouth.     fluorometholone (FML) 0.1 % ophthalmic suspension Place 1 drop into the right eye 4 (four) times daily.     fluticasone-salmeterol (ADVAIR HFA) 230-21 MCG/ACT inhaler Inhale 2 puffs into the lungs 2 (two) times daily. 12 g 5   hydroxypropyl methylcellulose / hypromellose (ISOPTO TEARS / GONIOVISC) 2.5 % ophthalmic solution Place 1 drop into both eyes 3 (three) times daily as needed for dry eyes.     hydroxypropyl methylcellulose / hypromellose (ISOPTO TEARS / GONIOVISC) 2.5 % ophthalmic solution Apply to eye.     latanoprost  (XALATAN) 0.005 % ophthalmic solution      LORazepam (ATIVAN) 0.5 MG tablet TAKE 1 TABLET BY MOUTH ONCE DAILY AT BEDTIME. 30 tablet 0   lovastatin (MEVACOR) 40 MG tablet TAKE 1 TABLET BY MOUTH DAILY. 90 tablet 0   metroNIDAZOLE (METROGEL) 0.75 % gel Apply topically.     montelukast (SINGULAIR) 10 MG tablet Take 1 tablet (10 mg total) by mouth at bedtime. 30 tablet 5   mupirocin ointment (BACTROBAN) 2 % Apply topically 3 (three) times daily.     nitroGLYCERIN (NITROSTAT) 0.4 MG SL tablet Place 1 tablet (0.4 mg total) under the tongue every 5 (five) minutes as needed for chest pain. 25 tablet 5   oxymetazoline (AFRIN) 0.05 % nasal spray Place 1 spray into both nostrils 2 (two) times daily.     Spacer/Aero-Holding [name] Use as directed 1 each 0   sulfamethoxazole-trimethoprim (BACTRIM DS) 800-160 MG tablet Take 1 tablet by mouth 2 (two) times daily. 14 tablet 0   No current facility-administered medications for this visit.    Allergies  Beta adrenergic blockers, Lasix [furosemide], Penicillin g, Penicillin g sodium, Cefzil [cefprozil], Ciprofloxacin, Dexamethasone, Gabapentin, Methocarbamol, Neomycin, Penicillins, and Tetracyclines & related  Electrocardiogram:  04/07/18  SR PAC;s otherwise normal 05/12/2022 SR rate 69 normal   Assessment and Plan  CAD:  Distant PCI RCA 1999. Low risk myovue April 2018 Clinically stable Intolerant to beta blockers continue medical Rx Lexiscan myovue ordered see above   AAA: 5.1 cm by CTA 06/10/21 with thrombus burden fu Dr Donnetta Hutching [place] 12/30/21 5.01 cm with right iliac 1.9 cm Threshold for surgery per Dr Early 5.5 cm    HTN:  Well controlled.  Continue current medications and low sodium Dash type diet.    GERD:  Continue carafate and nexium f/u GI  Asthma/Emphysema :  On inhalers no active wheezing F/U [facility]   PAC/SVT:  Stable unable to take beta blockers  Anxiety/Depression:   Has not started on SSRI   ENT:  ARB held hearing worse using Aides no  further epistaxis Double vision w/u ongoing MRI negative f/u primary and Dr Katy Fitch   HLD:  On mevacor 40 mg daily LDL 67 at goal   Hernia:  F/u Arnoldo Morale post surgery 2021 improved   Lexiscan Myovue  F/U [facility] January F/U with me in 6 months  Jenkins [place]

## 2022-05-05 ENCOUNTER — Telehealth: Payer: Medicare Other | Admitting: Physician Assistant

## 2022-05-05 DIAGNOSIS — J019 Acute sinusitis, unspecified: Secondary | ICD-10-CM | POA: Diagnosis not present

## 2022-05-05 DIAGNOSIS — B9689 Other specified bacterial agents as the cause of diseases classified elsewhere: Secondary | ICD-10-CM | POA: Diagnosis not present

## 2022-05-05 MED ORDER — SULFAMETHOXAZOLE-TRIMETHOPRIM 800-160 MG PO TABS: 1.0000 | ORAL_TABLET | Freq: Two times a day (BID) | ORAL | 0 refills | Status: DC

## 2022-05-05 NOTE — Progress Notes (Signed)
I have spent 5 minutes in review of e-visit questionnaire, review and updating patient chart, medical decision making and response to patient.   Avaneesh Pepitone Cody Awad Gladd, PA-C    

## 2022-05-05 NOTE — Progress Notes (Signed)
E-Visit for Sinus Problems  We are sorry that you are not feeling well.  Here is how we plan to help!  Based on what you have shared with me it looks like you have sinusitis.  Sinusitis is inflammation and infection in the sinus cavities of the head.  Based on your presentation I believe you most likely have Acute Bacterial Sinusitis.  This is an infection caused by bacteria and is treated with antibiotics. Giving your medication allergies and intolerances, I have prescribed Bactrim one tablet twice daily for 7 days. You may use an oral decongestant such as Mucinex D or if you have glaucoma or high blood pressure use plain Mucinex. Saline nasal spray help and can safely be used as often as needed for congestion.  If you develop worsening sinus pain, fever or notice severe headache and vision changes, or if symptoms are not better after completion of antibiotic, please schedule an appointment with a health care provider.    Sinus infections are not as easily transmitted as other respiratory infection, however we still recommend that you avoid close contact with loved ones, especially the very young and elderly.  Remember to wash your hands thoroughly throughout the day as this is the number one way to prevent the spread of infection!  Home Care: Only take medications as instructed by your medical team. Complete the entire course of an antibiotic. Do not take these medications with alcohol. A steam or ultrasonic humidifier can help congestion.  You can place a towel over your head and breathe in the steam from hot water coming from a faucet. Avoid close contacts especially the very young and the elderly. Cover your mouth when you cough or sneeze. Always remember to wash your hands.  Get Help Right Away If: You develop worsening fever or sinus pain. You develop a severe head ache or visual changes. Your symptoms persist after you have completed your treatment plan.  Make sure you Understand these  instructions. Will watch your condition. Will get help right away if you are not doing well or get worse.  Thank you for choosing an e-visit.  Your e-visit answers were reviewed by a board certified advanced clinical practitioner to complete your personal care plan. Depending upon the condition, your plan could have included both over the counter or prescription medications.  Please review your pharmacy choice. Make sure the pharmacy is open so you can pick up prescription now. If there is a problem, you may contact your provider through [employer] and have the prescription routed to another pharmacy.  Your safety is important to [place]. If you have drug allergies check your prescription carefully.   For the next 24 hours you can use MyChart to ask questions about today's visit, request a non-urgent call back, or ask for a work or school excuse. You will get an email in the next two days asking about your experience. I hope that your e-visit has been valuable and will speed your recovery.

## 2022-05-12 ENCOUNTER — Ambulatory Visit: Payer: Medicare Other | Attending: Cardiovascular Disease | Admitting: Cardiovascular Disease

## 2022-05-12 ENCOUNTER — Encounter: Payer: Self-pay | Admitting: Cardiovascular Disease

## 2022-05-12 VITALS — BP 110/72 | HR 69 | Ht 73.0 in | Wt 204.6 lb

## 2022-05-12 DIAGNOSIS — I2510 Atherosclerotic heart disease of native coronary artery without angina pectoris: Secondary | ICD-10-CM | POA: Diagnosis not present

## 2022-05-12 DIAGNOSIS — H532 Diplopia: Secondary | ICD-10-CM | POA: Diagnosis not present

## 2022-05-12 DIAGNOSIS — R079 Chest pain, unspecified: Secondary | ICD-10-CM | POA: Insufficient documentation

## 2022-05-12 DIAGNOSIS — R0609 Other forms of dyspnea: Secondary | ICD-10-CM | POA: Insufficient documentation

## 2022-05-12 DIAGNOSIS — I714 Abdominal aortic aneurysm, without rupture, unspecified: Secondary | ICD-10-CM | POA: Insufficient documentation

## 2022-05-12 DIAGNOSIS — I10 Essential (primary) hypertension: Secondary | ICD-10-CM | POA: Diagnosis not present

## 2022-05-12 NOTE — Patient Instructions (Addendum)
Medication Instructions:  Your physician recommends that you continue on your current medications as directed. Please refer to the Current Medication list given to you today.  Labwork: None  Testing/Procedures: Your physician has requested that you have a lexiscan myoview. For further information please visit [email or web address]. Please follow instruction sheet, as given.   Follow-Up: Follow up with Dr. Johnsie Cancel in 6 months.   Any Other Special Instructions Will Be Listed Below (If Applicable).     If you need a refill on your cardiac medications before your next appointment, please call your pharmacy.

## 2022-05-17 ENCOUNTER — Other Ambulatory Visit: Payer: Self-pay | Admitting: Family Medicine

## 2022-05-17 DIAGNOSIS — G4700 Insomnia, unspecified: Secondary | ICD-10-CM

## 2022-05-24 ENCOUNTER — Ambulatory Visit (HOSPITAL_COMMUNITY)
Admission: RE | Admit: 2022-05-24 | Discharge: 2022-05-24 | Disposition: A | Discharge status: Home / Self Care | Payer: Medicare Other | Source: Ambulatory Visit | Attending: Cardiovascular Disease | Admitting: Cardiovascular Disease

## 2022-05-24 DIAGNOSIS — R079 Chest pain, unspecified: Secondary | ICD-10-CM

## 2022-05-24 MED ORDER — TECHNETIUM TC 99M TETROFOSMIN IV KIT
10.0000 | PACK | Freq: Once | INTRAVENOUS | Status: AC | PRN
  Administered 2022-05-24: 9.7 via INTRAVENOUS

## 2022-05-24 MED ORDER — REGADENOSON 0.4 MG/5ML IV SOLN
INTRAVENOUS | Status: AC
  Administered 2022-05-24: 0.4 mg via INTRAVENOUS
  Filled 2022-05-24: qty 5

## 2022-05-24 MED ORDER — TECHNETIUM TC 99M TETROFOSMIN IV KIT
30.0000 | PACK | Freq: Once | INTRAVENOUS | Status: AC | PRN
  Administered 2022-05-24: 32 via INTRAVENOUS

## 2022-05-24 MED ORDER — SODIUM CHLORIDE FLUSH 0.9 % IV SOLN
INTRAVENOUS | Status: AC
  Administered 2022-05-24: 10 mL via INTRAVENOUS
  Filled 2022-05-24: qty 10

## 2022-05-25 LAB — NM MYOCAR MULTI W/SPECT W/WALL MOTION / EF
LV dias vol: 103 mL (ref 62–150)
LV sys vol: 54 mL
Nuc Stress EF: 47 %
Peak HR: 93 {beats}/min
RATE: 0.4
Rest HR: 71 {beats}/min
Rest Nuclear Isotope Dose: 9.7 mCi
SDS: 1
SRS: 0
SSS: 1
ST Depression (mm): 0 mm
Stress Nuclear Isotope Dose: 32 mCi
TID: 1.08

## 2022-05-26 ENCOUNTER — Telehealth: Payer: Self-pay

## 2022-05-26 NOTE — Telephone Encounter (Signed)
-----   Message from Josue Hector, MD sent at 05/25/2022  4:57 PM EST ----- Low risk myovue old MI no ischemia continue medical rx

## 2022-05-26 NOTE — Telephone Encounter (Signed)
Patient notified and verbalized understanding. Patient had no questions or concerns at this time.  

## 2022-05-27 DIAGNOSIS — Z1283 Encounter for screening for malignant neoplasm of skin: Secondary | ICD-10-CM | POA: Diagnosis not present

## 2022-05-27 DIAGNOSIS — L0212 Furuncle of neck: Secondary | ICD-10-CM | POA: Diagnosis not present

## 2022-05-27 DIAGNOSIS — B9689 Other specified bacterial agents as the cause of diseases classified elsewhere: Secondary | ICD-10-CM | POA: Diagnosis not present

## 2022-05-27 DIAGNOSIS — X32XXXD Exposure to sunlight, subsequent encounter: Secondary | ICD-10-CM | POA: Diagnosis not present

## 2022-05-27 DIAGNOSIS — L0202 Furuncle of face: Secondary | ICD-10-CM | POA: Diagnosis not present

## 2022-05-27 DIAGNOSIS — L080 Pyoderma: Secondary | ICD-10-CM | POA: Diagnosis not present

## 2022-05-27 DIAGNOSIS — L570 Actinic keratosis: Secondary | ICD-10-CM | POA: Diagnosis not present

## 2022-06-02 ENCOUNTER — Encounter: Payer: Self-pay | Admitting: *Deleted

## 2022-06-03 DIAGNOSIS — L308 Other specified dermatitis: Secondary | ICD-10-CM | POA: Diagnosis not present

## 2022-06-23 DIAGNOSIS — R490 Dysphonia: Secondary | ICD-10-CM | POA: Diagnosis not present

## 2022-06-23 DIAGNOSIS — R040 Epistaxis: Secondary | ICD-10-CM | POA: Diagnosis not present

## 2022-06-29 ENCOUNTER — Ambulatory Visit (INDEPENDENT_AMBULATORY_CARE_PROVIDER_SITE_OTHER): Payer: Medicare Other | Admitting: Family Medicine

## 2022-06-29 VITALS — BP 110/82 | HR 68 | Temp 97.7°F | Ht 73.0 in | Wt 209.0 lb

## 2022-06-29 DIAGNOSIS — R600 Localized edema: Secondary | ICD-10-CM | POA: Diagnosis not present

## 2022-06-29 NOTE — Progress Notes (Signed)
Subjective:  Patient ID: [name] [name], male    DOB: 01/05/1941  Age: 82 y.o. MRN: [id number]  CC: Chief Complaint  Patient presents with   swelling of legs and feet    Significantly worse fri, sat, sun along with fatigue and chills, has COPD no other symptoms reported. States had a reaction to bactrim ds last time used    HPI:  82 year old male presents for evaluation of the above.  Patient reports that on Friday he started to not feel well.  He has had chills and fatigue.  He has had no documented fever.  No reports of respiratory symptoms.  He states that he has had ongoing lower extremity edema particularly on the left side.  He states that this was quite concerning for him.  He feels better today but is still concerned.  Patient Active Problem List   Diagnosis Date Noted   Lower extremity edema 06/29/2022   Diplopia 04/22/2022   Fatigue 04/22/2022   Irritable bowel syndrome 03/17/2022   Olecranon bursitis 01/12/2022   Osteoarthritis of right knee 12/06/2021   Seasonal allergies 08/06/2021   COPD (chronic obstructive pulmonary disease) ([place]) 04/23/2021   BPH (benign prostatic hyperplasia) 04/23/2021   CAD (coronary artery disease) 04/23/2021   Hypertension 09/16/2016   Lumbar spondylosis 11/05/2014   GERD (gastroesophageal reflux disease) 01/19/2012   Hyperlipidemia 04/15/2008   AAA (abdominal aortic aneurysm) ([place]) 04/15/2008    Social Hx   Social History   Socioeconomic History   Marital status: Widowed    Spouse name: Not on file   Number of children: Not on file   Years of education: Not on file   Highest education level: Not on file  Occupational History   Not on file  Tobacco Use   Smoking status: Former    Packs/day: 1.00    Years: 35.00    Total pack years: 35.00    Types: Cigarettes    Quit date: 03/28/2003    Years since quitting: 19.2   Smokeless tobacco: Never  Vaping Use   Vaping Use: Never used  Substance and Sexual Activity   Alcohol use:  No    Alcohol/week: 0.0 standard drinks of alcohol   Drug use: No   Sexual activity: Not Currently    Birth control/protection: None  Other Topics Concern   Not on file  Social History Narrative   Not on file   Social Determinants of Health   Financial Resource Strain: Low Risk  (03/18/2022)   Overall Financial Resource Strain (CARDIA)    Difficulty of Paying Living Expenses: Not very hard  Food Insecurity: No Food Insecurity (03/18/2022)   Hunger Vital Sign    Worried About Running Out of Food in the Last Year: Never true    [place] in the Last Year: Never true  Transportation Needs: No Transportation Needs (03/18/2022)   PRAPARE - Hydrologist (Medical): No    Lack of Transportation (Non-Medical): No  Physical Activity: Insufficiently Active (03/18/2022)   Exercise Vital Sign    Days of Exercise per Week: 2 days    Minutes of Exercise per Session: 20 min  Stress: No Stress Concern Present (03/18/2022)   [place]    Feeling of Stress : Not at all  Social Connections: Socially Isolated (03/18/2022)   Social Connection and Isolation Panel [NHANES]    Frequency of Communication with Friends and Family: More than three times  a week    Frequency of Social Gatherings with Friends and Family: Three times a week    Attends Religious Services: Never    Active Member of Clubs or Organizations: No    Attends Archivist Meetings: Never    Marital Status: Widowed    Review of Systems Per HPI  Objective:  BP 110/82   Pulse 68   Temp 97.7 F (36.5 C)   Ht 6' 1"$  (1.854 m)   Wt 209 lb (94.8 kg)   SpO2 100%   BMI 27.57 kg/m      06/29/2022    4:12 PM 05/12/2022   10:54 AM 04/22/2022   10:12 AM  BP/Weight  Systolic BP A999333 [id number] 94  Diastolic BP 82 72 65  Wt. (Lbs) 209 204.6 203.6  BMI 27.57 kg/m2 26.99 kg/m2 26.86 kg/m2    Physical Exam Vitals and nursing note  reviewed.  Constitutional:      General: He is not in acute distress.    Appearance: Normal appearance.  HENT:     Head: Normocephalic and atraumatic.  Eyes:     General:        Right eye: No discharge.        Left eye: No discharge.     Conjunctiva/sclera: Conjunctivae normal.  Cardiovascular:     Rate and Rhythm: Normal rate and regular rhythm.  Pulmonary:     Effort: Pulmonary effort is normal.     Breath sounds: Rales present.  Musculoskeletal:     Comments: Bilateral lower extremity edema, left greater than right.  Neurological:     Mental Status: He is alert.  Psychiatric:        Mood and Affect: Mood normal.        Behavior: Behavior normal.     Lab Results  Component Value Date   WBC 8.2 04/22/2022   HGB 13.5 04/22/2022   HCT 43.1 04/22/2022   PLT 213 04/22/2022   GLUCOSE 102 (H) 04/22/2022   CHOL 124 04/23/2021   TRIG 109 04/23/2021   HDL 39 (L) 04/23/2021   LDLCALC 65 04/23/2021   ALT 16 03/03/2022   AST 19 03/03/2022   NA 137 04/22/2022   K 4.5 04/22/2022   CL 103 04/22/2022   CREATININE 0.92 04/22/2022   BUN 14 04/22/2022   CO2 28 04/22/2022   TSH 1.230 02/14/2019   PSA 1.80 07/26/2014   HGBA1C 6.1 (H) 04/23/2021     Assessment & Plan:   Problem List Items Addressed This Visit       Other   Lower extremity edema - Primary    Patient is afebrile and overall well-appearing.  He does have lower extremity edema.  This is chronic.  I believe that this is all secondary to venous insufficiency.  He said prior workup before.  Labs for further evaluation today.  Advised elevation and compression.  Supportive care.      Relevant Orders   CMP14+EGFR   Magnesium   Brain natriuretic peptide    Follow-up:  Return if symptoms worsen or fail to improve.  [place]

## 2022-06-29 NOTE — Patient Instructions (Signed)
Labs today.  Compression stockings.  Elevated legs and stay as active as you can.  Take care  Dr. Lacinda Axon

## 2022-06-29 NOTE — Assessment & Plan Note (Signed)
Patient is afebrile and overall well-appearing.  He does have lower extremity edema.  This is chronic.  I believe that this is all secondary to venous insufficiency.  He said prior workup before.  Labs for further evaluation today.  Advised elevation and compression.  Supportive care.

## 2022-06-30 LAB — CMP14+EGFR
ALT: 16 IU/L (ref 0–44)
AST: 18 IU/L (ref 0–40)
Albumin/Globulin Ratio: 1.5 (ref 1.2–2.2)
Albumin: 4.3 g/dL (ref 3.8–4.8)
Alkaline Phosphatase: 72 IU/L (ref 44–121)
BUN/Creatinine Ratio: 13 (ref 10–24)
BUN: 12 mg/dL (ref 8–27)
Bilirubin Total: 0.7 mg/dL (ref 0.0–1.2)
CO2: 25 mmol/L (ref 20–29)
Calcium: 9.1 mg/dL (ref 8.6–10.2)
Chloride: 99 mmol/L (ref 96–106)
Creatinine, Ser: 0.91 mg/dL (ref 0.76–1.27)
Globulin, Total: 2.8 g/dL (ref 1.5–4.5)
Glucose: 95 mg/dL (ref 70–99)
Potassium: 4.6 mmol/L (ref 3.5–5.2)
Sodium: 137 mmol/L (ref 134–144)
Total Protein: 7.1 g/dL (ref 6.0–8.5)
eGFR: 85 mL/min/{1.73_m2} (ref >59)

## 2022-06-30 LAB — MAGNESIUM: Magnesium: 1.9 mg/dL (ref 1.6–2.3)

## 2022-06-30 LAB — BRAIN NATRIURETIC PEPTIDE: BNP: 59.4 pg/mL (ref 0.0–100.0)

## 2022-07-01 DIAGNOSIS — H5022 Vertical strabismus, left eye: Secondary | ICD-10-CM | POA: Diagnosis not present

## 2022-07-01 DIAGNOSIS — H3581 Retinal edema: Secondary | ICD-10-CM | POA: Diagnosis not present

## 2022-07-01 DIAGNOSIS — H532 Diplopia: Secondary | ICD-10-CM | POA: Diagnosis not present

## 2022-07-01 DIAGNOSIS — H401122 Primary open-angle glaucoma, left eye, moderate stage: Secondary | ICD-10-CM | POA: Diagnosis not present

## 2022-07-01 DIAGNOSIS — H5000 Unspecified esotropia: Secondary | ICD-10-CM | POA: Diagnosis not present

## 2022-07-06 ENCOUNTER — Other Ambulatory Visit: Payer: Self-pay | Admitting: Family Medicine

## 2022-07-06 DIAGNOSIS — E785 Hyperlipidemia, unspecified: Secondary | ICD-10-CM

## 2022-07-08 ENCOUNTER — Telehealth: Payer: Self-pay | Admitting: Pulmonary Disease

## 2022-07-08 MED ORDER — PREDNISONE 20 MG PO TABS: 20.0000 mg | ORAL_TABLET | Freq: Every day | ORAL | 0 refills | Status: DC

## 2022-07-08 MED ORDER — AZITHROMYCIN 250 MG PO TABS: ORAL_TABLET | ORAL | 0 refills | Status: DC

## 2022-07-08 NOTE — Telephone Encounter (Signed)
See other phone note dated 07/08/22

## 2022-07-08 NOTE — Telephone Encounter (Signed)
Pharm: [facility]  Dr. Ander Slade said he could call in if not feeling well and he would call in a Zpac. Pls call pt @ [phone]

## 2022-07-08 NOTE — Telephone Encounter (Signed)
Spoke with pt who is c/o increased chest congestion and dry cough. Pt states currently taking Advair and Albuterol and is taking Mucinex. Pt was scheduled for OV with Dr. Ander Slade on 06/2722. Pt is requesting Z-Pak. Dr. Babs Bertin please advise.

## 2022-07-08 NOTE — Telephone Encounter (Signed)
See other note  Dr Jenetta Downer called in zpack and pred  Called the pt and left detailed msg that this was sent

## 2022-07-08 NOTE — Telephone Encounter (Signed)
Called in azithromycin and prednisone

## 2022-07-13 ENCOUNTER — Ambulatory Visit (INDEPENDENT_AMBULATORY_CARE_PROVIDER_SITE_OTHER): Payer: Medicare Other | Admitting: Pulmonary Disease

## 2022-07-13 ENCOUNTER — Encounter: Payer: Self-pay | Admitting: Pulmonary Disease

## 2022-07-13 VITALS — BP 124/82 | HR 66 | Ht 73.0 in | Wt 207.6 lb

## 2022-07-13 DIAGNOSIS — J441 Chronic obstructive pulmonary disease with (acute) exacerbation: Secondary | ICD-10-CM

## 2022-07-13 DIAGNOSIS — J449 Chronic obstructive pulmonary disease, unspecified: Secondary | ICD-10-CM

## 2022-07-13 NOTE — Patient Instructions (Signed)
Continue Advair  You can get a spacer device online  Regular exercises as tolerated  I will see you in 4 to 5 months  Call us with any significant concerns

## 2022-07-13 NOTE — Progress Notes (Signed)
History of Present Illness [name] [name] is a 82 y.o. male former  smoker ( quit 2004 with a 35 pack year smoking history) with COPD with chronic bronchitis and emphysema.  Maintenance medication  is Advair 230 Rescue is Albuterol  Recently had a course of azithromycin and steroids for an exacerbation  Comes in today feeling a little bit better He still does have an occasional cough Breathing is stabilizing from recent exacerbation  He is compliant with Advair Uses albuterol as needed  Activity level remains about the same Has not been exercising regularly  Denies any significant chest pain or chest discomfort  Triggers for shortness of breath include weather changes, exposure to dust/fumes  Did participate in pulmonary rehab in 2022  Test Results: CXR 05/26/2020 Emphysematous changes. No pneumothorax or pleural effusion. No focal consolidation. Left hemidiaphragm eventration, unchanged. Stable cardiomediastinal silhouette. No acute osseous abnormality. No focal consolidation.  Emphysema.     Latest Ref Rng & Units 04/22/2022   11:57 AM 03/03/2022   12:30 PM 08/06/2021   11:01 AM  CBC  WBC 4.0 - 10.5 K/uL 8.2  7.8  8.3   Hemoglobin 13.0 - 17.0 g/dL 13.5  13.2  12.9   Hematocrit 39.0 - 52.0 % 43.1  41.3  39.6   Platelets 150 - 400 K/uL 213  229  265.0        Latest Ref Rng & Units 06/29/2022    4:54 PM 04/22/2022   11:57 AM 03/03/2022    1:00 PM  BMP  Glucose 70 - 99 mg/dL 95  102    BUN 8 - 27 mg/dL 12  14    Creatinine 0.76 - 1.27 mg/dL 0.91  0.92  0.90   BUN/Creat Ratio 10 - 24 13     Sodium 134 - 144 mmol/L 137  137    Potassium 3.5 - 5.2 mmol/L 4.6  4.5    Chloride 96 - 106 mmol/L 99  103    CO2 20 - 29 mmol/L 25  28    Calcium 8.6 - 10.2 mg/dL 9.1  9.2      BNP    Component Value Date/Time   BNP 59.4 06/29/2022 1654   BNP 44.0 09/16/2016 0235    ProBNP No results found for: "PROBNP"  PFT    Component Value Date/Time   FEV1PRE 1.58 11/05/2019  1342   FEV1POST 1.72 11/05/2019 1342   FVCPRE 2.80 11/05/2019 1342   FVCPOST 2.82 11/05/2019 1342   DLCOUNC 32.71 11/05/2019 1342   PREFEV1FVCRT 56 11/05/2019 1342   PSTFEV1FVCRT 61 11/05/2019 1342    No results found.   Past medical hx Past Medical History:  Diagnosis Date   AAA (abdominal aortic aneurysm) ([place])    needs yearly ultrasound   Allergy    Anemia    Arthritis    Asthma    BCC (basal cell carcinoma) 08/18/1989   left shoulder blad, upper right arm, left arm beyond elbow, c&d   BCC (basal cell carcinoma) 01/31/1992   Posterior neck, curetx3, 60f   BCC (basal cell carcinoma) 11/22/2001   mid forehead, cx3, excision, right forearm, cx3, 53f  BCC (basal cell carcinoma) 10/09/2003   mid forehead, MOHs   BCC (basal cell carcinoma) 08/15/2008   upper left back, biopsy   BPH (benign prostatic hyperplasia)    CAD (coronary artery disease)    Cancer (HCC)    skin cancer   COPD [place]chronic obstructive pulmonary disease) ([facility]   Dysrhythmia  pt. states it can be fast at times   GERD (gastroesophageal reflux disease)    Glaucoma    HOH (hard of hearing)    Hypercholesterolemia    Hypertension    Impaired fasting glucose    Low back pain    Melanoma in situ ([place]) 10/09/2003   left chin, MOHs   MI (myocardial infarction) ([place]) 1999   SCC (squamous cell carcinoma) 07/03/2014   in situ, behind left ear, cx3, cautery, 30f   SCC (squamous cell carcinoma) 07/03/2014   well diff, left forearm, biopsy, cx1, cautery   SCC (squamous cell carcinoma) 07/20/2017   in situ, left upper arm, cx3, 549f  SCC (squamous cell carcinoma) 01/10/2019   in situ, left post shoulder, cx3, 33f46f SCC (squamous cell carcinoma) 11/22/2001   left forearm distal, left forearm, cx3, [id number] (squamous cell carcinoma) 10/09/2003   [place], left ear post, clear per st, right cheek clear   SCC (squamous cell carcinoma) 03/30/2004   in situ, left upper arm, cx3, 33fu 7fCC (squamous cell  carcinoma) 03/08/2005   in situ, right cheek, mid upper forehead, cx3, 33fu  33fC (squamous cell carcinoma) 06/08/2006   in situ, left shoulder, cx3, 33fu   46f (squamous cell carcinoma) 05/05/2010   right inner wrist, biopsy   SCC (squamous cell carcinoma) 09/13/2013   in situ, right crown scalp, front scalp, biopsy   Thrush      Social History   Tobacco Use   Smoking status: Former    Packs/day: 1.00    Years: 35.00    Total pack years: 35.00    Types: Cigarettes    Quit date: 03/28/2003    Years since quitting: 19.3   Smokeless tobacco: Never  Vaping Use   Vaping Use: Never used  Substance Use Topics   Alcohol use: No    Alcohol/week: 0.0 standard drinks of alcohol   Drug use: No    [name] [name] reports that he quit smoking about 19 years ago. His smoking use included cigarettes. He has a 35.00 pack-year smoking history. He has never used smokeless tobacco. He reports that he does not drink alcohol and does not use drugs.  Tobacco Cessation: Former smoker , Quit 2004 with a 35 pack year smoking history  Past surgical hx, Family hx, Social hx all reviewed.  Current Outpatient Medications on File Prior to Visit  Medication Sig   albuterol (PROVENTIL) (2.5 MG/3ML) 0.083% nebulizer solution Take 3 mLs (2.5 mg total) by nebulization every 6 (six) hours as needed for wheezing or shortness of breath.   albuterol (VENTOLIN HFA) 108 (90 Base) MCG/ACT inhaler Inhale 1-2 puffs into the lungs every 6 (six) hours as needed for wheezing or shortness of breath.   alfuzosin (UROXATRAL) 10 MG 24 hr tablet Take 1 tablet (10 mg total) by mouth daily with breakfast.   azithromycin (ZITHROMAX Z-PAK) 250 MG tablet Take 2 tablets day 1 and then 1 daily for 4 days   cetirizine (ZYRTEC) 10 MG tablet Take 10 mg by mouth daily.   COMBIGAN 0.2-0.5 % ophthalmic solution Apply 1 drop to eye 2 (two) times daily.   desonide (DESOWEN) 0.05 % cream Apply topically 2 (two) times daily.   esomeprazole (NEXIUM)  20 MG capsule Take by mouth.   fluorometholone (FML) 0.1 % ophthalmic suspension Place 1 drop into the right eye 4 (four) times daily.   fluticasone-salmeterol (ADVAIR HFA) 230-21 MCG/ACT inhaler Inhale 2 puffs into the lungs  2 (two) times daily.   hydroxypropyl methylcellulose / hypromellose (ISOPTO TEARS / GONIOVISC) 2.5 % ophthalmic solution Place 1 drop into both eyes 3 (three) times daily as needed for dry eyes.   hydroxypropyl methylcellulose / hypromellose (ISOPTO TEARS / GONIOVISC) 2.5 % ophthalmic solution Apply to eye.   LORazepam (ATIVAN) 0.5 MG tablet Take 1 tablet (0.5 mg total) by mouth at bedtime as needed for anxiety.   lovastatin (MEVACOR) 40 MG tablet TAKE 1 TABLET BY MOUTH DAILY.   metroNIDAZOLE (METROGEL) 0.75 % gel Apply topically.   mupirocin ointment (BACTROBAN) 2 % Apply topically 3 (three) times daily.   nitroGLYCERIN (NITROSTAT) 0.4 MG SL tablet Place 1 tablet (0.4 mg total) under the tongue every 5 (five) minutes as needed for chest pain.   oxymetazoline (AFRIN) 0.05 % nasal spray Place 1 spray into both nostrils 2 (two) times daily.   Spacer/Aero-Holding [name] Use as directed   No current facility-administered medications on file prior to visit.     Allergies  Allergen Reactions   Bactrim [Sulfamethoxazole-Trimethoprim] Hives   Other Anaphylaxis and Other (See Comments)   Beta Adrenergic Blockers Diarrhea and Other (See Comments)    Does not recall an allergy   Lasix [Furosemide] Rash   Penicillin G     Other reaction(s): Unknown   Penicillin G Sodium     Other reaction(s): Unknown   Cefzil [Cefprozil] Nausea Only    Patient does recall allergy   Ciprofloxacin Nausea And Vomiting, Rash and Other (See Comments)    Body aches    Dexamethasone Swelling   Gabapentin Swelling   Methocarbamol Swelling and Rash   Neomycin Other (See Comments)    Other reaction(s): Unknown   Penicillins Swelling and Rash    Has patient had a PCN reaction causing  immediate rash, facial/tongue/throat swelling, SOB or lightheadedness with hypotension: yes Has patient had a PCN reaction causing severe rash involving mucus membranes or skin necrosis: unknown Has patient had a PCN reaction that required hospitalization: no Has patient had a PCN reaction occurring within the last 10 years: No If all of the above answers are "NO", then may proceed with Cephalosporin use.    Sulfamethoxazole Rash   Tetracyclines & Related Itching    Review Of Systems:  No weight loss, no fever, no chills No visual complaints No chest pains or chest discomfort No shortness of breath at rest, no wheezing, does have a cough with sputum production No skin rash No genitourinary symptoms  Vital Signs BP 124/82 (BP Location: Left Arm, Patient Position: Sitting, Cuff Size: Normal)   Pulse 66   Ht '6\' 1"'$  (1.854 m)   Wt 207 lb 9.6 oz (94.2 kg)   SpO2 98%   BMI 27.39 kg/m    Physical Exam: Appearance - well kempt  ENMT -moist oral mucosa Neck -no masses, no JVD Respiratory -decreased air movement but clear CV -S1-S2 appreciated with no murmur Ext - no cyanosis, clubbing, or joint inflammation noted Skin - no rashes, lesions, or ulcers Neuro - oriented to person, place, and time   Pulmonary function test from 11/05/2019 reveals moderately severe obstructive disease with normal diffusing capacity  Alpha-1 antitrypsin level of 138 IgE of 97  Assessment/Plan  Chronic obstructive pulmonary disease with recent exacerbation -Appears to be stabilizing from recent exacerbation -Remains on Advair that he uses with a spacer device -Albuterol use as needed  Encourage graded activities as tolerated  Encourage regular exercises  Escalating treatment was considered and will consider  Trelegy or Judithann Sauger however he does have significant prostate enlargement and history of glaucoma, will hold off on anticholinergic at present  Encouraged to give [place] a call with any  significant concerns  Follow-up in 4 to 5 months  Dakarri Kessinger Clearence Ped, MD 07/13/2022  10:44 AM

## 2022-07-14 ENCOUNTER — Ambulatory Visit: Payer: Medicare Other | Admitting: Vascular Surgery

## 2022-07-14 ENCOUNTER — Other Ambulatory Visit: Payer: Medicare Other

## 2022-07-15 ENCOUNTER — Encounter: Payer: Self-pay | Admitting: Radiology

## 2022-07-20 ENCOUNTER — Other Ambulatory Visit: Payer: Self-pay

## 2022-07-20 DIAGNOSIS — I714 Abdominal aortic aneurysm, without rupture, unspecified: Secondary | ICD-10-CM

## 2022-07-21 ENCOUNTER — Ambulatory Visit (INDEPENDENT_AMBULATORY_CARE_PROVIDER_SITE_OTHER): Payer: Medicare Other

## 2022-07-21 ENCOUNTER — Ambulatory Visit (INDEPENDENT_AMBULATORY_CARE_PROVIDER_SITE_OTHER): Payer: Medicare Other | Admitting: Vascular Surgery

## 2022-07-21 ENCOUNTER — Encounter: Payer: Self-pay | Admitting: Vascular Surgery

## 2022-07-21 VITALS — BP 141/101 | HR 71 | Temp 98.1°F | Ht 73.0 in | Wt 204.8 lb

## 2022-07-21 DIAGNOSIS — I7143 Infrarenal abdominal aortic aneurysm, without rupture: Secondary | ICD-10-CM | POA: Diagnosis not present

## 2022-07-21 DIAGNOSIS — I714 Abdominal aortic aneurysm, without rupture, unspecified: Secondary | ICD-10-CM | POA: Diagnosis not present

## 2022-07-21 NOTE — Progress Notes (Signed)
[facility] [place]  Patient name: [name] [name] MRN: [id number] DOB: [birth year] Sex: male  REASON FOR VISIT: Follow-up known infrarenal abdominal aortic aneurysm  HPI: [name] [name] is a 82 y.o. male here today for follow-up.  He continues to be quite active with no major medical difficulties.  He has no symptoms referable to his aneurysm.  Past Medical History:  Diagnosis Date   AAA (abdominal aortic aneurysm) ([place])    needs yearly ultrasound   Allergy    Anemia    Arthritis    Asthma    BCC (basal cell carcinoma) 08/18/1989   left shoulder blad, upper right arm, left arm beyond elbow, c&d   BCC (basal cell carcinoma) 01/31/1992   Posterior neck, curetx3, 99f   BCC (basal cell carcinoma) 11/22/2001   mid forehead, cx3, excision, right forearm, cx3, 547f  BCC (basal cell carcinoma) 10/09/2003   mid forehead, MOHs   BCC (basal cell carcinoma) 08/15/2008   upper left back, biopsy   BPH (benign prostatic hyperplasia)    CAD (coronary artery disease)    Cancer (HCC)    skin cancer   COPD [place]ch[place] disease) ([facility]   Dysrhythmia    pt. states it can be fast at times   GERD (gastroesophageal reflux disease)    Glaucoma    HOH (hard of hearing)    Hypercholesterolemia    Hypertension    Impaired fasting glucose    Low back pain    [place] in [place]   left chin, MOHs   [place] ([place]ocardial infarction) [place]   SCC (squamous cell carcinoma) 07/03/2014   in situ, behind left ear, cx3, cautery, [id number] SCC (squamous cell carcinoma) 07/03/2014   well diff, left forearm, biopsy, cx1, cautery   SCC (squamous cell carcinoma) 07/20/2017   in situ, left upper arm, cx3, [id number] (squamous cell carcinoma) 01/10/2019   in situ, left post shoulder, cx3, 37fu 30fCC (squamous cell carcinoma) 11/22/2001   left forearm distal, left forearm, cx3, 37fu  60fC (squamous cell carcinoma) 10/09/2003    Bowens, left ear post, clear per st, right cheek clear   SCC (squamous cell carcinoma) 03/30/2004   in situ, left upper arm, cx3, 37fu   27f (squamous cell carcinoma) 03/08/2005   in situ, right cheek, mid upper forehead, cx3, 37fu   S72f(squamous cell carcinoma) 06/08/2006   in situ, left shoulder, cx3, 37fu   SC637fsquamous cell carcinoma) 05/05/2010   right inner wrist, biopsy   SCC (squamous cell carcinoma) 09/13/2013   in situ, right crown scalp, front scalp, biopsy   Thrush     Family History  Problem Relation Age of Onset   Hypertension Mother    COPD Father    Cancer Brother        brain    SOCIAL HISTORY: Social History   Tobacco Use   Smoking status: Former    Packs/day: 1.00    Years: 35.00    Total pack years: 35.00    Types: Cigarettes    Quit date: 03/28/2003    Years since quitting: 19.3   Smokeless tobacco: Never  Substance Use Topics   Alcohol use: No    Alcohol/week: 0.0 standard drinks of alcohol    Allergies  Allergen Reactions   Bactrim [Sulfamethoxazole-Trimethoprim] Hives   Other Anaphylaxis and Other (See Comments)   Beta Adrenergic Blockers Diarrhea and Other (See Comments)  Does not recall an allergy   Lasix [Furosemide] Rash   Penicillin G     Other reaction(s): Unknown   Penicillin G Sodium     Other reaction(s): Unknown   Cefzil [Cefprozil] Nausea Only    Patient does recall allergy   Ciprofloxacin Nausea And Vomiting, Rash and Other (See Comments)    Body aches    Dexamethasone Swelling   Gabapentin Swelling   Methocarbamol Swelling and Rash   Neomycin Other (See Comments)    Other reaction(s): Unknown   Penicillins Swelling and Rash    Has patient had a PCN reaction causing immediate rash, facial/tongue/throat swelling, SOB or lightheadedness with hypotension: yes Has patient had a PCN reaction causing severe rash involving mucus membranes or skin necrosis: unknown Has patient had a PCN reaction that required hospitalization:  no Has patient had a PCN reaction occurring within the last 10 years: No If all of the above answers are "NO", then may proceed with Cephalosporin use.    Sulfamethoxazole Rash   Tetracyclines & Related Itching    Current Outpatient Medications  Medication Sig Dispense Refill   albuterol (PROVENTIL) (2.5 MG/3ML) 0.083% nebulizer solution Take 3 mLs (2.5 mg total) by nebulization every 6 (six) hours as needed for wheezing or shortness of breath. 360 mL 5   albuterol (VENTOLIN HFA) 108 (90 Base) MCG/ACT inhaler Inhale 1-2 puffs into the lungs every 6 (six) hours as needed for wheezing or shortness of breath. 18 g 5   alfuzosin (UROXATRAL) 10 MG 24 hr tablet Take 1 tablet (10 mg total) by mouth daily with breakfast. 90 tablet 3   azithromycin (ZITHROMAX Z-PAK) 250 MG tablet Take 2 tablets day 1 and then 1 daily for 4 days 6 each 0   cetirizine (ZYRTEC) 10 MG tablet Take 10 mg by mouth daily.     COMBIGAN 0.2-0.5 % ophthalmic solution Apply 1 drop to eye 2 (two) times daily.     desonide (DESOWEN) 0.05 % cream Apply topically 2 (two) times daily.     esomeprazole (NEXIUM) 20 MG capsule Take by mouth.     fluorometholone (FML) 0.1 % ophthalmic suspension Place 1 drop into the right eye 4 (four) times daily.     fluticasone-salmeterol (ADVAIR HFA) 230-21 MCG/ACT inhaler Inhale 2 puffs into the lungs 2 (two) times daily. 12 g 5   hydroxypropyl methylcellulose / hypromellose (ISOPTO TEARS / GONIOVISC) 2.5 % ophthalmic solution Place 1 drop into both eyes 3 (three) times daily as needed for dry eyes.     hydroxypropyl methylcellulose / hypromellose (ISOPTO TEARS / GONIOVISC) 2.5 % ophthalmic solution Apply to eye.     LORazepam (ATIVAN) 0.5 MG tablet Take 1 tablet (0.5 mg total) by mouth at bedtime as needed for anxiety. 30 tablet 3   lovastatin (MEVACOR) 40 MG tablet TAKE 1 TABLET BY MOUTH DAILY. 90 tablet 0   metroNIDAZOLE (METROGEL) 0.75 % gel Apply topically.     mupirocin ointment (BACTROBAN) 2  % Apply topically 3 (three) times daily.     nitroGLYCERIN (NITROSTAT) 0.4 MG SL tablet Place 1 tablet (0.4 mg total) under the tongue every 5 (five) minutes as needed for chest pain. 25 tablet 5   oxymetazoline (AFRIN) 0.05 % nasal spray Place 1 spray into both nostrils 2 (two) times daily.     Spacer/Aero-Holding [name] Use as directed 1 each 0   No current facility-administered medications for this visit.    REVIEW OF SYSTEMS:  '[X]'$  denotes positive finding, '[ ]'$   denotes negative finding Cardiac  Comments:  Chest pain or chest pressure:    Shortness of breath upon exertion:    Short of breath when lying flat:    Irregular heart rhythm:        Vascular    Pain in calf, thigh, or hip brought on by ambulation:    Pain in feet at night that wakes you up from your sleep:     Blood clot in your veins:    Leg swelling:           PHYSICAL EXAM: Vitals:   07/21/22 0945  BP: (!) 141/101  Pulse: 71  Temp: 98.1 F (36.7 C)  SpO2: 96%  Weight: 204 lb 12.8 oz (92.9 kg)  Height: '6\' 1"'$  (1.854 m)    GENERAL: The patient is a well-nourished male, in no acute distress. The vital signs are documented above. CARDIOVASCULAR: I do not palpate an aneurysm.  He has 2+ radial 2+ popliteal and 2+ dorsalis pedis pulses without evidence of peripheral artery aneurysms. PULMONARY: There is good air exchange  MUSCULOSKELETAL: There are no major deformities or cyanosis. NEUROLOGIC: No focal weakness or paresthesias are detected. SKIN: There are no ulcers or rashes noted. PSYCHIATRIC: The patient has a normal affect.  DATA:  Duplex today shows a maximal diameter of 5.4 cm.  MEDICAL ISSUES: Had a long discussion with the patient regarding his slow aneurysm growth by ultrasound.  He had undergone CT scan in February 2023 with a maximal diameter of 5.1 cm.  Most recent ultrasound in August 2023 was 5.1 cm as well.  It appears that he may have had a 3 mm increase in size since that last study.  I  explained that he is "on the fence" regarding timing for aneurysm repair.  Explained that his 90-monthinterval risk for rupture is extremely low.  I have recommended that we see him again in 6 months with CT angiogram for evaluation of size and also determination of treatment options.  He understands symptoms of leaking aneurysm and knows to report immediately to the emergency room should this occur    TRosetta Posner MD [facility]Harrington [facility] HospitalV[facility]Post [facility]([phone] Note: Portions of this report may have been transcribed using voice recognition software.  Every effort has been made to ensure accuracy; however, inadvertent computerized transcription errors may still be present.

## 2022-07-29 ENCOUNTER — Ambulatory Visit: Payer: Medicare Other | Admitting: Family Medicine

## 2022-08-30 DIAGNOSIS — I2510 Atherosclerotic heart disease of native coronary artery without angina pectoris: Secondary | ICD-10-CM | POA: Diagnosis not present

## 2022-08-30 DIAGNOSIS — N182 Chronic kidney disease, stage 2 (mild): Secondary | ICD-10-CM | POA: Diagnosis not present

## 2022-08-30 DIAGNOSIS — I129 Hypertensive chronic kidney disease with stage 1 through stage 4 chronic kidney disease, or unspecified chronic kidney disease: Secondary | ICD-10-CM | POA: Diagnosis not present

## 2022-08-30 DIAGNOSIS — R809 Proteinuria, unspecified: Secondary | ICD-10-CM | POA: Diagnosis not present

## 2022-08-30 DIAGNOSIS — E785 Hyperlipidemia, unspecified: Secondary | ICD-10-CM | POA: Diagnosis not present

## 2022-08-30 DIAGNOSIS — J449 Chronic obstructive pulmonary disease, unspecified: Secondary | ICD-10-CM | POA: Diagnosis not present

## 2022-08-31 LAB — LAB REPORT - SCANNED
Albumin, Urine POC: <3
Creatinine, POC: 68.6 mg/dL
EGFR: 83
Microalb Creat Ratio: <4

## 2022-09-08 ENCOUNTER — Telehealth: Payer: Self-pay | Admitting: Pulmonary Disease

## 2022-09-08 ENCOUNTER — Other Ambulatory Visit: Payer: Self-pay

## 2022-09-08 DIAGNOSIS — J449 Chronic obstructive pulmonary disease, unspecified: Secondary | ICD-10-CM

## 2022-09-08 MED ORDER — ALBUTEROL SULFATE (2.5 MG/3ML) 0.083% IN NEBU: 2.5000 mg | INHALATION_SOLUTION | Freq: Four times a day (QID) | RESPIRATORY_TRACT | 5 refills | Status: DC | PRN

## 2022-09-08 MED ORDER — ALBUTEROL SULFATE HFA 108 (90 BASE) MCG/ACT IN AERS: 1.0000 | INHALATION_SPRAY | Freq: Four times a day (QID) | RESPIRATORY_TRACT | 5 refills | Status: DC | PRN

## 2022-09-08 NOTE — Telephone Encounter (Signed)
albuterol (VENTOLIN HFA) 108 (90 Base) MCG/ACT inhaler need set to Pharmacy and albuterol (PROVENTIL) (2.5 MG/3ML) 0.083% nebulizer solution needs to be sent to [facility] in [place] he is currently out

## 2022-09-08 NOTE — Telephone Encounter (Signed)
Refills for inhaler and neb solution has been sent to pharmacy. Pt is aware. NFN

## 2022-09-09 NOTE — Telephone Encounter (Signed)
Patient states needs Nebulizer machine kit. Pharmacy is [facility]. Patient phone number is [phone] and [phone].

## 2022-09-10 ENCOUNTER — Other Ambulatory Visit: Payer: Self-pay

## 2022-09-10 DIAGNOSIS — J449 Chronic obstructive pulmonary disease, unspecified: Secondary | ICD-10-CM

## 2022-09-10 MED ORDER — ALBUTEROL SULFATE (2.5 MG/3ML) 0.083% IN NEBU: 2.5000 mg | INHALATION_SOLUTION | Freq: Four times a day (QID) | RESPIRATORY_TRACT | 5 refills | Status: DC | PRN

## 2022-09-10 NOTE — Telephone Encounter (Signed)
Pt. Calling about getting Nebulizer kit over to pharmacy but no one has called him back can we please advise

## 2022-09-10 NOTE — Telephone Encounter (Signed)
Nebulizer kit has been sent to [facility]. Nebulizer medication has been sent to correct pharmacy. Pt is aware. NFN

## 2022-09-11 ENCOUNTER — Other Ambulatory Visit: Payer: Self-pay | Admitting: Family Medicine

## 2022-09-11 DIAGNOSIS — G4700 Insomnia, unspecified: Secondary | ICD-10-CM

## 2022-09-14 ENCOUNTER — Telehealth: Payer: Self-pay | Admitting: Pulmonary Disease

## 2022-09-14 ENCOUNTER — Telehealth: Payer: Self-pay | Admitting: Family Medicine

## 2022-09-14 NOTE — Telephone Encounter (Signed)
Pls see last closed encounter. PT states [facility] filled Albuterol Solution, not the Nebulizer kit (hardware).  I read to PT order we placed and assured him no error on our part. Pharm states they have no order for kits.  Pls call PT to advise.- TY   [phone]

## 2022-09-14 NOTE — Telephone Encounter (Signed)
Called and spoke with [facility]  Pt needing new order for neb supplies  Order placed  Nothing further needed

## 2022-09-14 NOTE — Telephone Encounter (Signed)
Refill on  LORazepam (ATIVAN) 0.5 MG tablet  [facility]

## 2022-09-14 NOTE — Telephone Encounter (Signed)
Mandy from [employer] on fax sent for [employer]. Mandy phone number is [phone]. Fax number is [phone].

## 2022-09-15 ENCOUNTER — Other Ambulatory Visit: Payer: Self-pay | Admitting: Family Medicine

## 2022-09-15 DIAGNOSIS — G4700 Insomnia, unspecified: Secondary | ICD-10-CM

## 2022-09-15 MED ORDER — LORAZEPAM 0.5 MG PO TABS
0.5000 mg | ORAL_TABLET | Freq: Every evening | ORAL | 3 refills | Status: DC | PRN
Start: 2022-09-15 — End: 2023-01-14

## 2022-09-16 ENCOUNTER — Telehealth: Payer: Self-pay | Admitting: Pulmonary Disease

## 2022-09-16 DIAGNOSIS — J441 Chronic obstructive pulmonary disease with (acute) exacerbation: Secondary | ICD-10-CM

## 2022-09-16 NOTE — Telephone Encounter (Signed)
PT almost out of Advair. Phamr has fax'd [place] and we have not responded.  PHARM: [facility].   PT # is [phone]

## 2022-09-17 MED ORDER — FLUTICASONE-SALMETEROL 230-21 MCG/ACT IN AERO
2.0000 | INHALATION_SPRAY | Freq: Two times a day (BID) | RESPIRATORY_TRACT | 5 refills | Status: DC
Start: 2022-09-17 — End: 2023-03-31

## 2022-09-17 NOTE — Telephone Encounter (Signed)
Cook, Jayce G, DO     Refilled.     

## 2022-09-17 NOTE — Telephone Encounter (Signed)
Order was placed 4/30 for pt to receive nebulizer supplies from [facility].  Due to pt stating that [facility] told him that they did not have an order, routing to [facility] for review on this.

## 2022-09-17 NOTE — Telephone Encounter (Signed)
Pt called the office about his Advair. Verified pharmacy that the Rx needed to be sent to and have sent it in for pt.nothing further needed.

## 2022-09-17 NOTE — Telephone Encounter (Signed)
Synetta Fail put on the order that patient picked up supplies yesterday she called [facility]

## 2022-09-29 DIAGNOSIS — L218 Other seborrheic dermatitis: Secondary | ICD-10-CM | POA: Diagnosis not present

## 2022-09-29 DIAGNOSIS — L570 Actinic keratosis: Secondary | ICD-10-CM | POA: Diagnosis not present

## 2022-09-29 DIAGNOSIS — X32XXXD Exposure to sunlight, subsequent encounter: Secondary | ICD-10-CM | POA: Diagnosis not present

## 2022-09-29 DIAGNOSIS — C44311 Basal cell carcinoma of skin of nose: Secondary | ICD-10-CM | POA: Diagnosis not present

## 2022-10-04 ENCOUNTER — Other Ambulatory Visit: Payer: Self-pay | Admitting: Family Medicine

## 2022-10-04 DIAGNOSIS — E785 Hyperlipidemia, unspecified: Secondary | ICD-10-CM

## 2022-10-07 NOTE — Telephone Encounter (Signed)
Pt is out of med

## 2022-10-15 ENCOUNTER — Ambulatory Visit (INDEPENDENT_AMBULATORY_CARE_PROVIDER_SITE_OTHER): Payer: Medicare Other | Admitting: Family Medicine

## 2022-10-15 VITALS — BP 128/77 | HR 65 | Temp 97.6°F | Ht 73.0 in | Wt 204.0 lb

## 2022-10-15 DIAGNOSIS — R1031 Right lower quadrant pain: Secondary | ICD-10-CM

## 2022-10-15 NOTE — Patient Instructions (Signed)
I am going to reach out to Dr. Lovell Sheehan and get his input.  I will be in touch.  Take care  Dr. Adriana Simas

## 2022-10-15 NOTE — Assessment & Plan Note (Signed)
Etiology unclear at this time.  I have reached out to patient's former surgeon and he agrees to see the patient for further evaluation.  Placing referral today.

## 2022-10-15 NOTE — Progress Notes (Signed)
Subjective:  Patient ID: [name] [name], male    DOB: 12-Aug-1940  Age: 82 y.o. MRN: [id number]  CC: Chief Complaint  Patient presents with   right lower groin pain    Last weeks episode had upset stomach and loose bowels- on and off For a year - hernia repair 3 yrs ago. Marlex mesh - for hernia repair-by Dr Lovell Sheehan hx of IBS     HPI:  82 year old male presents for evaluation of recurrent right lower quadrant abdominal pain/inguinal pain.  I have seen the patient previously for this.  He has had labs and CT imaging which have been unremarkable.  Pain is intermittent.  He states that last week he had quite significant pain.  He also had some loose stools.  He states that he improved with watching his diet and taking ibuprofen.  He is not having any pain at this time.  He has had prior partial due to polyp that could not be removed endoscopically.  He is also had prior right inguinal hernia repair.  He also has a history of IBS.  He denies any pain with walking.  He does note that his pain seems to be worse at night.  No fevers.  No other complaints or concerns at this time.  Patient Active Problem List   Diagnosis Date Noted   Irritable bowel syndrome 03/17/2022   Recurrent right lower quadrant abdominal pain 03/03/2022   Olecranon bursitis 01/12/2022   Osteoarthritis of right knee 12/06/2021   Seasonal allergies 08/06/2021   COPD (chronic obstructive pulmonary disease) (HCC) 04/23/2021   BPH (benign prostatic hyperplasia) 04/23/2021   CAD (coronary artery disease) 04/23/2021   Hypertension 09/16/2016   Lumbar spondylosis 11/05/2014   GERD (gastroesophageal reflux disease) 01/19/2012   Hyperlipidemia 04/15/2008   AAA (abdominal aortic aneurysm) (HCC) 04/15/2008    Social Hx   Social History   Socioeconomic History   Marital status: Widowed    Spouse name: Not on file   Number of children: Not on file   Years of education: Not on file   Highest education level: Not on file   Occupational History   Not on file  Tobacco Use   Smoking status: Former    Packs/day: 1.00    Years: 35.00    Additional pack years: 0.00    Total pack years: 35.00    Types: Cigarettes    Quit date: 03/28/2003    Years since quitting: 19.5   Smokeless tobacco: Never  Vaping Use   Vaping Use: Never used  Substance and Sexual Activity   Alcohol use: No    Alcohol/week: 0.0 standard drinks of alcohol   Drug use: No   Sexual activity: Not Currently    Birth control/protection: None  Other Topics Concern   Not on file  Social History Narrative   Not on file   Social Determinants of Health   Financial Resource Strain: Low Risk  (03/18/2022)   Overall Financial Resource Strain (CARDIA)    Difficulty of Paying Living Expenses: Not very hard  Food Insecurity: No Food Insecurity (03/18/2022)   Hunger Vital Sign    Worried About Running Out of Food in the Last Year: Never true    Ran Out of Food in the Last Year: Never true  Transportation Needs: No Transportation Needs (03/18/2022)   PRAPARE - Administrator, Civil Service (Medical): No    Lack of Transportation (Non-Medical): No  Physical Activity: Insufficiently Active (03/18/2022)   Exercise Vital  Sign    Days of Exercise per Week: 2 days    Minutes of Exercise per Session: 20 min  Stress: No Stress Concern Present (03/18/2022)   [employer] of Occupational Health - Occupational Stress Questionnaire    Feeling of Stress : Not at all  Social Connections: Socially Isolated (03/18/2022)   Social Connection and Isolation Panel [NHANES]    Frequency of Communication with Friends and Family: More than three times a week    Frequency of Social Gatherings with Friends and Family: Three times a week    Attends Religious Services: Never    Active Member of Clubs or Organizations: No    Attends Banker Meetings: Never    Marital Status: Widowed    Review of Systems Per HPI  Objective:  BP 128/77    Pulse 65   Temp 97.6 F (36.4 C)   Ht 6\' 1"  (1.854 m)   Wt 204 lb (92.5 kg)   SpO2 97%   BMI 26.91 kg/m      10/15/2022    9:29 AM 07/21/2022    9:45 AM 07/13/2022    9:58 AM  BP/Weight  Systolic BP 128 141 124  Diastolic BP 77 101 82  Wt. (Lbs) 204 204.8 207.6  BMI 26.91 kg/m2 27.02 kg/m2 27.39 kg/m2    Physical Exam Vitals and nursing note reviewed.  Constitutional:      Appearance: Normal appearance.  HENT:     Head: Normocephalic and atraumatic.  Cardiovascular:     Rate and Rhythm: Normal rate and regular rhythm.  Pulmonary:     Effort: Pulmonary effort is normal.     Breath sounds: Normal breath sounds.  Abdominal:     General: There is no distension.     Palpations: Abdomen is soft.     Comments: No significant tenderness on exam.  Genitourinary:    Comments: No inguinal hernia on exam. Neurological:     Mental Status: He is alert.  Psychiatric:        Mood and Affect: Mood normal.        Behavior: Behavior normal.     Lab Results  Component Value Date   WBC 8.2 04/22/2022   HGB 13.5 04/22/2022   HCT 43.1 04/22/2022   PLT 213 04/22/2022   GLUCOSE 95 06/29/2022   CHOL 124 04/23/2021   TRIG 109 04/23/2021   HDL 39 (L) 04/23/2021   LDLCALC 65 04/23/2021   ALT 16 06/29/2022   AST 18 06/29/2022   NA 137 06/29/2022   K 4.6 06/29/2022   CL 99 06/29/2022   CREATININE 0.91 06/29/2022   BUN 12 06/29/2022   CO2 25 06/29/2022   TSH 1.230 02/14/2019   PSA 1.80 07/26/2014   HGBA1C 6.1 (H) 04/23/2021     Assessment & Plan:   Problem List Items Addressed This Visit       Other   Recurrent right lower quadrant abdominal pain - Primary    Etiology unclear at this time.  I have reached out to patient's former surgeon and he agrees to see the patient for further evaluation.  Placing referral today.      Relevant Orders   Ambulatory referral to General Surgery   Follow-up: Pending general surgery evaluation  Everlene Other DO [facility]

## 2022-10-18 ENCOUNTER — Telehealth: Payer: Self-pay | Admitting: Pulmonary Disease

## 2022-10-18 MED ORDER — AZITHROMYCIN 250 MG PO TABS: ORAL_TABLET | ORAL | 0 refills | Status: DC

## 2022-10-18 NOTE — Telephone Encounter (Signed)
Last visit July 13, 2022 with Dr. Wynona Neat May send in Z-Pak No. 1 take as directed Mucinex DM twice daily as needed for cough and congestion Continue on current maintenance regimen.  Patient will need office visit with chest x-ray if symptoms or not improving.   Please contact office for sooner follow up if symptoms do not improve or worsen or seek emergency care

## 2022-10-18 NOTE — Telephone Encounter (Signed)
Routing to provider of day Tammy due to Dr. Val Eagle not being avail until 6/5.

## 2022-10-18 NOTE — Telephone Encounter (Signed)
Spoke with patient. He complains of chest congestion, productive cough with dark phlegm, SOB-states this is normal. Symptoms started about a week ago No fever Patient has tried mucinex.  Pharmacy [facility] in [place]   Patient is requesting a zpak.  Dr. Val Eagle please advise

## 2022-10-18 NOTE — Telephone Encounter (Signed)
Spoke with patient. Advised antibiotic has been sent to pharmacy and advised he will need OV if not improving. He verbalized understanding. NFN

## 2022-10-19 DIAGNOSIS — H10403 Unspecified chronic conjunctivitis, bilateral: Secondary | ICD-10-CM | POA: Diagnosis not present

## 2022-11-01 NOTE — Progress Notes (Signed)
Patient ID: [name] [name], male   DOB: 1941/02/10, 82 y.o.   MRN: [id number]      82 y.o. history of AAA, CAD pancreatitis PCI RCA 1999 with low risk myovue 2018. Clinically stable Intolerant to beta blockers with diarrhea . AAA 4.6 cm by [place] 05/12/20 but now 5.1 cm by [place] 12/30/21 Has seen Dr Arbie Cookey and threshold for EVAR 5.5 cm   Wife passed a few  years ago of Pancreatic Cancer and only lasted 6 weeks form diagnosis Has daughter in [place]    Had idiopathic pancreatitis 2022  Not a drinker has had GB removed Seen by Dr Dulce Sellar no etiology noted  No angina or cardiac complaints Former smoker quit in 2014 Doing cardiopulmonary rehab 2x/ week Has seen Dr Davonna Belling in past   He is active with some dyspnea from COPD Age and pulmonary status make him a poor candidate for open AAA repair AAA 5.3 cm on CTA 10/18;23  Dr Arbie Cookey "on fence" about impending need for intervention and suggested seeing him again in August  No abdominal pain or issues with AAA  Myovue 05/24/22 with fixed inferior defect no ischemia EF 45%  He needs f/u with [facility] now that Dr Arbie Cookey is retired Duplex in March [facility] office 5.4 cm with 3 mm increase compared to CT  ROS: Denies fever, malais, weight loss, blurry vision, decreased visual acuity, cough, sputum, SOB, hemoptysis, pleuritic pain, palpitaitons, heartburn, abdominal pain, melena, lower extremity edema, claudication, or rash.  All other systems reviewed and negative  General: Ht 6\' 1"  (1.854 m)   Wt 206 lb 9.6 oz (93.7 kg)   BMI 27.26 kg/m   Affect appropriate Healthy:  appears stated age HEENT: hard of hearing Aides in place  Neck supple with no adenopathy JVP normal no bruits no thyromegaly Lungs Mild exp wheezing and good diaphragmatic motion Heart:  S1/S2 no murmur, no rub, gallop or click PMI normal Abdomen: benighn, BS positve, no tenderness, AAA palpable not tender  no bruit.  No HSM or HJR Post right hernia surgery repair but persistent  Umbilical hernia   Distal pulses intact with no bruits No edema Neuro non-focal Skin warm and dry No muscular weakness   Current Outpatient Medications  Medication Sig Dispense Refill   albuterol (PROVENTIL) (2.5 MG/3ML) 0.083% nebulizer solution Take 3 mLs (2.5 mg total) by nebulization every 6 (six) hours as needed for wheezing or shortness of breath. 360 mL 5   albuterol (VENTOLIN HFA) 108 (90 Base) MCG/ACT inhaler Inhale 1-2 puffs into the lungs every 6 (six) hours as needed for wheezing or shortness of breath. 18 g 5   alfuzosin (UROXATRAL) 10 MG 24 hr tablet Take 1 tablet (10 mg total) by mouth daily with breakfast. 90 tablet 3   cetirizine (ZYRTEC) 10 MG tablet Take 10 mg by mouth daily.     COMBIGAN 0.2-0.5 % ophthalmic solution Apply 1 drop to eye 2 (two) times daily.     desonide (DESOWEN) 0.05 % cream Apply topically 2 (two) times daily.     esomeprazole (NEXIUM) 20 MG capsule Take 20 mg by mouth daily.     fluticasone-salmeterol (ADVAIR HFA) 230-21 MCG/ACT inhaler Inhale 2 puffs into the lungs 2 (two) times daily. 12 g 5   hydroxypropyl methylcellulose / hypromellose (ISOPTO TEARS / GONIOVISC) 2.5 % ophthalmic solution Place 1 drop into both eyes 3 (three) times daily as needed for dry eyes.     LORazepam (ATIVAN) 0.5 MG tablet Take 1 tablet (0.5  mg total) by mouth at bedtime as needed for anxiety. 30 tablet 3   lovastatin (MEVACOR) 40 MG tablet TAKE 1 TABLET BY MOUTH DAILY. (Patient taking differently: Take 40 mg by mouth at bedtime.) 90 tablet 0   metroNIDAZOLE (METROGEL) 0.75 % gel Apply topically.     mupirocin ointment (BACTROBAN) 2 % Apply topically 3 (three) times daily.     nitroGLYCERIN (NITROSTAT) 0.4 MG SL tablet Place 1 tablet (0.4 mg total) under the tongue every 5 (five) minutes as needed for chest pain. (Patient taking differently: Place 0.4 mg under the tongue every 5 (five) minutes x 3 doses as needed for chest pain (if no relief after 3rd dose, proceed to ED or call 911).) 25  tablet 5   oxymetazoline (AFRIN) 0.05 % nasal spray Place 1 spray into both nostrils 2 (two) times daily.     Spacer/Aero-Holding Rudean Curt Use as directed 1 each 0   No current facility-administered medications for this visit.    Allergies  Bactrim [sulfamethoxazole-trimethoprim], Other, Beta adrenergic blockers, Lasix [furosemide], Penicillin g, Penicillin g sodium, Cefzil [cefprozil], Ciprofloxacin, Dexamethasone, Gabapentin, Methocarbamol, Neomycin, Penicillins, Sulfamethoxazole, and Tetracyclines & related  Electrocardiogram:  04/07/18  SR PAC;s otherwise normal 11/12/2022 SR rate 69 normal   Assessment and Plan  CAD:  Distant PCI RCA 1999. Myovue 05/2022 fixed old IMI no ischemia low risk  Clinically stable Intolerant to beta blockers continue medical Rx   AAA: 5.4 cm with some increase in last 6 months will refer to Dr Ulla Gallo to consider stent graft as opposed to observation at this point   HTN:  Well controlled.  Continue current medications and low sodium Dash type diet.    GERD:  Continue carafate and nexium f/u GI  Asthma/Emphysema :  On inhalers no active wheezing F/U [place]   PAC/SVT:  Stable unable to take beta blockers  Anxiety/Depression:   Has not started on SSRI   ENT:  ARB held hearing worse using Aides no further epistaxis Double vision w/u ongoing MRI negative f/u primary and Dr Dione Booze   HLD:  On mevacor 40 mg daily LDL 67 at goal   Hernia:  F/u Lovell Sheehan post surgery 2021 improved   Refer [facility] for stent graft consideration    F/U with me in 6 months  Charlton Haws

## 2022-11-02 ENCOUNTER — Ambulatory Visit (INDEPENDENT_AMBULATORY_CARE_PROVIDER_SITE_OTHER): Payer: Medicare Other | Admitting: General Surgery

## 2022-11-02 VITALS — BP 121/85 | HR 74 | Temp 98.0°F | Resp 14 | Ht 73.0 in | Wt 205.0 lb

## 2022-11-02 DIAGNOSIS — K432 Incisional hernia without obstruction or gangrene: Secondary | ICD-10-CM

## 2022-11-02 NOTE — Progress Notes (Signed)
[name] [name]; [id number]; 08/16/1940   HPI Patient is an 82 year old white male who was referred by care by Dr. Adriana Simas for evaluation and treatment of right groin pain.  He is status post a right inguinal herniorrhaphy with mesh in 2021.  He states that intermittently he will develop pain in the right groin which does not radiate.  He has not noticed a lump in particular.  He was having pain in the right groin for many weeks, but recently has not had any pain in the right groin area for 2 weeks.  Certain coughing or twisting can cause some recurrence of the pain.  The pain does not radiate down his leg or towards the right testicle.  He denies any nausea or vomiting.  He also has a swelling above his umbilicus at his surgical scar site.  Again, is made worse with straining. Past Medical History:  Diagnosis Date   AAA (abdominal aortic aneurysm) (HCC)    needs yearly ultrasound   Allergy    Anemia    Arthritis    Asthma    BCC (basal cell carcinoma) 08/18/1989   left shoulder blad, upper right arm, left arm beyond elbow, c&d   BCC (basal cell carcinoma) 01/31/1992   Posterior neck, curetx3, 57fu   BCC (basal cell carcinoma) 11/22/2001   mid forehead, cx3, excision, right forearm, cx3, 70fu   BCC (basal cell carcinoma) 10/09/2003   mid forehead, MOHs   BCC (basal cell carcinoma) 08/15/2008   upper left back, biopsy   BPH (benign prostatic hyperplasia)    CAD (coronary artery disease)    Cancer (HCC)    skin cancer   COPD (chronic obstructive pulmonary disease) (HCC)    Dysrhythmia    pt. states it can be fast at times   GERD (gastroesophageal reflux disease)    Glaucoma    HOH (hard of hearing)    Hypercholesterolemia    Hypertension    Impaired fasting glucose    Low back pain    Melanoma in situ (HCC) 10/09/2003   left chin, MOHs   MI (myocardial infarction) (HCC) 1999   SCC (squamous cell carcinoma) 07/03/2014   in situ, behind left ear, cx3, cautery, 61fu   SCC (squamous cell  carcinoma) 07/03/2014   well diff, left forearm, biopsy, cx1, cautery   SCC (squamous cell carcinoma) 07/20/2017   in situ, left upper arm, cx3, 28fu   SCC (squamous cell carcinoma) 01/10/2019   in situ, left post shoulder, cx3, 92fu   SCC (squamous cell carcinoma) 11/22/2001   left forearm distal, left forearm, cx3, 59fu   SCC (squamous cell carcinoma) 10/09/2003   [place], left ear post, clear per st, right cheek clear   SCC (squamous cell carcinoma) 03/30/2004   in situ, left upper arm, cx3, 64fu   SCC (squamous cell carcinoma) 03/08/2005   in situ, right cheek, mid upper forehead, cx3, 76fu   SCC (squamous cell carcinoma) 06/08/2006   in situ, left shoulder, cx3, 32fu   SCC (squamous cell carcinoma) 05/05/2010   right inner wrist, biopsy   SCC (squamous cell carcinoma) 09/13/2013   in situ, right crown scalp, front scalp, biopsy   Thrush     Past Surgical History:  Procedure Laterality Date   BACK SURGERY     x 3   CARDIAC CATHETERIZATION     angioplasty   CATARACT EXTRACTION W/PHACO  03/20/2012   Procedure: CATARACT EXTRACTION PHACO AND INTRAOCULAR LENS PLACEMENT (IOC);  Surgeon: Susa Simmonds, MD;  Location: [facility];  Service: Ophthalmology;  Laterality: Right;  CDE:  8.45   CATARACT EXTRACTION W/PHACO Left 04/02/2013   Procedure: CATARACT EXTRACTION PHACO AND INTRAOCULAR LENS PLACEMENT (IOC);  Surgeon: Susa Simmonds, MD;  Location: [facility];  Service: Ophthalmology;  Laterality: Left;  CDE:  6.50   CHOLECYSTECTOMY  2000   COLONOSCOPY  2009   repeat 5 years   ESOPHAGOGASTRODUODENOSCOPY     HERNIA REPAIR Left    inguinal   INGUINAL HERNIA REPAIR Right 03/28/2020   Procedure: Right Inguinal Herniorrhaphy with Mesh;  Surgeon: Franky Macho, MD;  Location: [facility];  Service: General;  Laterality: Right;   LAPAROSCOPIC PARTIAL COLECTOMY N/A 06/11/2013   Procedure: LAPAROSCOPIC HAND ASSISTED PARTIAL COLECTOMY;  Surgeon: Dalia Heading, MD;  Location: [facility];  Service: General;   Laterality: N/A;   NASAL ENDOSCOPY WITH EPISTAXIS CONTROL Bilateral 02/11/2020   Procedure: NASAL ENDOSCOPY WITH EPISTAXIS CONTROL;  Surgeon: Newman Pies, MD;  Location: [facility];  Service: ENT;  Laterality: Bilateral;   right eye detached retina Bilateral    SPINAL FUSION  2016   YAG LASER APPLICATION Left 05/06/2014   Procedure: YAG LASER APPLICATION;  Surgeon: Susa Simmonds, MD;  Location: [facility];  Service: Ophthalmology;  Laterality: Left;    Family History  Problem Relation Age of Onset   Hypertension Mother    COPD Father    Cancer Brother        brain    Current Outpatient Medications on File Prior to Visit  Medication Sig Dispense Refill   albuterol (PROVENTIL) (2.5 MG/3ML) 0.083% nebulizer solution Take 3 mLs (2.5 mg total) by nebulization every 6 (six) hours as needed for wheezing or shortness of breath. 360 mL 5   albuterol (VENTOLIN HFA) 108 (90 Base) MCG/ACT inhaler Inhale 1-2 puffs into the lungs every 6 (six) hours as needed for wheezing or shortness of breath. 18 g 5   alfuzosin (UROXATRAL) 10 MG 24 hr tablet Take 1 tablet (10 mg total) by mouth daily with breakfast. 90 tablet 3   cetirizine (ZYRTEC) 10 MG tablet Take 10 mg by mouth daily.     COMBIGAN 0.2-0.5 % ophthalmic solution Apply 1 drop to eye 2 (two) times daily.     desonide (DESOWEN) 0.05 % cream Apply topically 2 (two) times daily.     esomeprazole (NEXIUM) 20 MG capsule Take by mouth.     fluticasone-salmeterol (ADVAIR HFA) 230-21 MCG/ACT inhaler Inhale 2 puffs into the lungs 2 (two) times daily. 12 g 5   hydroxypropyl methylcellulose / hypromellose (ISOPTO TEARS / GONIOVISC) 2.5 % ophthalmic solution Place 1 drop into both eyes 3 (three) times daily as needed for dry eyes.     hydroxypropyl methylcellulose / hypromellose (ISOPTO TEARS / GONIOVISC) 2.5 % ophthalmic solution Apply to eye.     LORazepam (ATIVAN) 0.5 MG tablet Take 1 tablet (0.5 mg total) by mouth at bedtime as needed for  anxiety. 30 tablet 3   lovastatin (MEVACOR) 40 MG tablet TAKE 1 TABLET BY MOUTH DAILY. 90 tablet 0   metroNIDAZOLE (METROGEL) 0.75 % gel Apply topically.     mupirocin ointment (BACTROBAN) 2 % Apply topically 3 (three) times daily.     nitroGLYCERIN (NITROSTAT) 0.4 MG SL tablet Place 1 tablet (0.4 mg total) under the tongue every 5 (five) minutes as needed for chest pain. 25 tablet 5   oxymetazoline (AFRIN) 0.05 % nasal spray Place 1 spray into both nostrils 2 (two) times daily.  Spacer/Aero-Holding Rudean Curt Use as directed 1 each 0   No current facility-administered medications on file prior to visit.    Allergies  Allergen Reactions   Bactrim [Sulfamethoxazole-Trimethoprim] Hives   Other Anaphylaxis and Other (See Comments)   Beta Adrenergic Blockers Diarrhea and Other (See Comments)    Does not recall an allergy   Lasix [Furosemide] Rash   Penicillin G     Other reaction(s): Unknown   Penicillin G Sodium     Other reaction(s): Unknown   Cefzil [Cefprozil] Nausea Only    Patient does recall allergy   Ciprofloxacin Nausea And Vomiting, Rash and Other (See Comments)    Body aches    Dexamethasone Swelling   Gabapentin Swelling   Methocarbamol Swelling and Rash   Neomycin Other (See Comments)    Other reaction(s): Unknown   Penicillins Swelling and Rash    Has patient had a PCN reaction causing immediate rash, facial/tongue/throat swelling, SOB or lightheadedness with hypotension: yes Has patient had a PCN reaction causing severe rash involving mucus membranes or skin necrosis: unknown Has patient had a PCN reaction that required hospitalization: no Has patient had a PCN reaction occurring within the last 10 years: No If all of the above answers are "NO", then may proceed with Cephalosporin use.    Sulfamethoxazole Rash   Tetracyclines & Related Itching    Social History   Substance and Sexual Activity  Alcohol Use No   Alcohol/week: 0.0 standard drinks of  alcohol    Social History   Tobacco Use  Smoking Status Former   Packs/day: 1.00   Years: 35.00   Additional pack years: 0.00   Total pack years: 35.00   Types: Cigarettes   Quit date: 03/28/2003   Years since quitting: 19.6  Smokeless Tobacco Never    Review of Systems  Constitutional: Negative.   HENT: Negative.    Eyes: Negative.   Respiratory: Negative.    Cardiovascular: Negative.   Gastrointestinal:  Positive for abdominal pain.  Genitourinary: Negative.   Musculoskeletal: Negative.   Skin: Negative.   Neurological: Negative.   Endo/Heme/Allergies: Negative.   Psychiatric/Behavioral: Negative.      Objective   Vitals:   11/02/22 1400  BP: 121/85  Pulse: 74  Resp: 14  Temp: 98 F (36.7 C)  SpO2: 96%    Physical Exam Vitals reviewed.  Constitutional:      Appearance: Normal appearance. He is normal weight. He is not ill-appearing.  HENT:     Head: Normocephalic.  Cardiovascular:     Rate and Rhythm: Normal rate and regular rhythm.     Heart sounds: Normal heart sounds. No murmur heard.    No friction rub. No gallop.  Pulmonary:     Effort: Pulmonary effort is normal. No respiratory distress.     Breath sounds: Normal breath sounds. No stridor. No wheezing, rhonchi or rales.  Abdominal:     General: Bowel sounds are normal. There is no distension.     Palpations: Abdomen is soft. There is no mass.     Tenderness: There is no abdominal tenderness. There is no guarding or rebound.     Hernia: A hernia is present.     Comments: A 5 cm reducible supraumbilical midline hernia is present.  I could not appreciate a hernia in the right groin region.  No point tenderness was noted over the internal ring.  No pubic tubercle tenderness was noted.  Skin:    General: Skin is warm and dry.  Neurological:     Mental Status: He is alert and oriented to person, place, and time.   A CT scan of the abdomen from 2023 was reviewed.  Assessment  Incisional hernia,  asymptomatic Right groin pain of unknown etiology, probable muscular strain.  No recurrent hernia present. Plan  I told the patient that I would get the incisional hernia fixed only if he becomes symptomatic.  I cannot explain the patient's right groin pain.  He does not have a recurrent hernia and it does not appear to be in the nerve distribution.  He understands this.  He will monitor his groin pain.  He should avoid anything that aggravates it.  Follow-up here as needed.

## 2022-11-12 ENCOUNTER — Encounter: Payer: Self-pay | Admitting: Cardiovascular Disease

## 2022-11-12 ENCOUNTER — Ambulatory Visit: Payer: Medicare Other | Attending: Cardiovascular Disease | Admitting: Cardiovascular Disease

## 2022-11-12 VITALS — BP 120/78 | HR 70 | Ht 73.0 in | Wt 206.6 lb

## 2022-11-12 DIAGNOSIS — E782 Mixed hyperlipidemia: Secondary | ICD-10-CM | POA: Insufficient documentation

## 2022-11-12 DIAGNOSIS — I10 Essential (primary) hypertension: Secondary | ICD-10-CM | POA: Diagnosis not present

## 2022-11-12 DIAGNOSIS — I2510 Atherosclerotic heart disease of native coronary artery without angina pectoris: Secondary | ICD-10-CM | POA: Diagnosis not present

## 2022-11-12 DIAGNOSIS — I714 Abdominal aortic aneurysm, without rupture, unspecified: Secondary | ICD-10-CM | POA: Insufficient documentation

## 2022-11-12 NOTE — Patient Instructions (Signed)
Medication Instructions:  Your physician recommends that you continue on your current medications as directed. Please refer to the Current Medication list given to you today.   Labwork: None today  Testing/Procedures: None today  Follow-Up: 6 months Dr.Nishan  Any Other Special Instructions Will Be Listed Below (If Applicable).    You have been referred to [facility]   If you need a refill on your cardiac medications before your next appointment, please call your pharmacy.

## 2022-11-16 DIAGNOSIS — H401122 Primary open-angle glaucoma, left eye, moderate stage: Secondary | ICD-10-CM | POA: Diagnosis not present

## 2022-11-16 DIAGNOSIS — H10403 Unspecified chronic conjunctivitis, bilateral: Secondary | ICD-10-CM | POA: Diagnosis not present

## 2022-11-25 DIAGNOSIS — Z08 Encounter for follow-up examination after completed treatment for malignant neoplasm: Secondary | ICD-10-CM | POA: Diagnosis not present

## 2022-11-25 DIAGNOSIS — Z85828 Personal history of other malignant neoplasm of skin: Secondary | ICD-10-CM | POA: Diagnosis not present

## 2022-12-06 ENCOUNTER — Telehealth: Payer: Self-pay

## 2022-12-06 DIAGNOSIS — R1031 Right lower quadrant pain: Secondary | ICD-10-CM

## 2022-12-06 NOTE — Telephone Encounter (Signed)
Pt needs referral to [facility], Dr Allene Pyo seen him about lower abdominal pain and want him to see Dr Lovell Sheehan first if he could not find the problem then he would refer him to gastro dr.   Leta Speller- [phone]

## 2022-12-07 NOTE — Telephone Encounter (Signed)
Tommie Sams, DO     Please place referral to gi.

## 2022-12-13 NOTE — Progress Notes (Unsigned)
Patient name: [name] [name] MRN: [id number] DOB: Jan 30, 1941 Sex: male  REASON FOR CONSULT: AAA  HPI: [name] [name] is a 82 y.o. male, with history of coronary artery disease status post MI, COPD, hypertension, hyperlipidemia that presents for evaluation of abdominal aortic aneurysm.  He was last seen by Dr. Arbie Cookey on 07/21/2022 with a 5.43 cm abdominal aortic aneurysm.  Dr. Arbie Cookey discussed with him that his size for repair was only "on the fence".  He recommended follow-up in 6 months with CTA.  Past Medical History:  Diagnosis Date   AAA (abdominal aortic aneurysm) (HCC)    needs yearly ultrasound   Allergy    Anemia    Arthritis    Asthma    BCC (basal cell carcinoma) 08/18/1989   left shoulder blad, upper right arm, left arm beyond elbow, c&d   BCC (basal cell carcinoma) 01/31/1992   Posterior neck, curetx3, 81fu   BCC (basal cell carcinoma) 11/22/2001   mid forehead, cx3, excision, right forearm, cx3, 1fu   BCC (basal cell carcinoma) 10/09/2003   mid forehead, MOHs   BCC (basal cell carcinoma) 08/15/2008   upper left back, biopsy   BPH (benign prostatic hyperplasia)    CAD (coronary artery disease)    Cancer (HCC)    skin cancer   COPD (chronic obstructive pulmonary disease) (HCC)    Dysrhythmia    pt. states it can be fast at times   GERD (gastroesophageal reflux disease)    Glaucoma    HOH (hard of hearing)    Hypercholesterolemia    Hypertension    Impaired fasting glucose    Low back pain    Melanoma in situ (HCC) 10/09/2003   left chin, MOHs   MI (myocardial infarction) (HCC) 1999   SCC (squamous cell carcinoma) 07/03/2014   in situ, behind left ear, cx3, cautery, 67fu   SCC (squamous cell carcinoma) 07/03/2014   well diff, left forearm, biopsy, cx1, cautery   SCC (squamous cell carcinoma) 07/20/2017   in situ, left upper arm, cx3, 72fu   SCC (squamous cell carcinoma) 01/10/2019   in situ, left post shoulder, cx3, 79fu   SCC (squamous cell carcinoma) 11/22/2001    left forearm distal, left forearm, cx3, 3fu   SCC (squamous cell carcinoma) 10/09/2003   [place], left ear post, clear per st, right cheek clear   SCC (squamous cell carcinoma) 03/30/2004   in situ, left upper arm, cx3, 36fu   SCC (squamous cell carcinoma) 03/08/2005   in situ, right cheek, mid upper forehead, cx3, 2fu   SCC (squamous cell carcinoma) 06/08/2006   in situ, left shoulder, cx3, 47fu   SCC (squamous cell carcinoma) 05/05/2010   right inner wrist, biopsy   SCC (squamous cell carcinoma) 09/13/2013   in situ, right crown scalp, front scalp, biopsy   Thrush     Past Surgical History:  Procedure Laterality Date   BACK SURGERY     x 3   CARDIAC CATHETERIZATION     angioplasty   CATARACT EXTRACTION W/PHACO  03/20/2012   Procedure: CATARACT EXTRACTION PHACO AND INTRAOCULAR LENS PLACEMENT (IOC);  Surgeon: Susa Simmonds, MD;  Location: [facility];  Service: Ophthalmology;  Laterality: Right;  CDE:  8.45   CATARACT EXTRACTION W/PHACO Left 04/02/2013   Procedure: CATARACT EXTRACTION PHACO AND INTRAOCULAR LENS PLACEMENT (IOC);  Surgeon: Susa Simmonds, MD;  Location: [facility];  Service: Ophthalmology;  Laterality: Left;  CDE:  6.50   CHOLECYSTECTOMY  2000  COLONOSCOPY  2009   repeat 5 years   ESOPHAGOGASTRODUODENOSCOPY     HERNIA REPAIR Left    inguinal   INGUINAL HERNIA REPAIR Right 03/28/2020   Procedure: Right Inguinal Herniorrhaphy with Mesh;  Surgeon: Franky Macho, MD;  Location: [facility];  Service: General;  Laterality: Right;   LAPAROSCOPIC PARTIAL COLECTOMY N/A 06/11/2013   Procedure: LAPAROSCOPIC HAND ASSISTED PARTIAL COLECTOMY;  Surgeon: Dalia Heading, MD;  Location: [facility];  Service: General;  Laterality: N/A;   NASAL ENDOSCOPY WITH EPISTAXIS CONTROL Bilateral 02/11/2020   Procedure: NASAL ENDOSCOPY WITH EPISTAXIS CONTROL;  Surgeon: Newman Pies, MD;  Location: [facility];  Service: ENT;  Laterality: Bilateral;   right eye detached retina Bilateral    SPINAL  FUSION  2016   YAG LASER APPLICATION Left 05/06/2014   Procedure: YAG LASER APPLICATION;  Surgeon: Susa Simmonds, MD;  Location: [facility];  Service: Ophthalmology;  Laterality: Left;    Family History  Problem Relation Age of Onset   Hypertension Mother    COPD Father    Cancer Brother        brain    SOCIAL HISTORY: Social History   Socioeconomic History   Marital status: Widowed    Spouse name: Not on file   Number of children: Not on file   Years of education: Not on file   Highest education level: Not on file  Occupational History   Not on file  Tobacco Use   Smoking status: Former    Current packs/day: 0.00    Average packs/day: 1 pack/day for 35.0 years (35.0 ttl pk-yrs)    Types: Cigarettes    Start date: 03/27/1968    Quit date: 03/28/2003    Years since quitting: 19.7   Smokeless tobacco: Never  Vaping Use   Vaping status: Never Used  Substance and Sexual Activity   Alcohol use: No    Alcohol/week: 0.0 standard drinks of alcohol   Drug use: No   Sexual activity: Not Currently    Birth control/protection: None  Other Topics Concern   Not on file  Social History Narrative   Not on file   Social Determinants of Health   Financial Resource Strain: Low Risk  (03/18/2022)   Overall Financial Resource Strain (CARDIA)    Difficulty of Paying Living Expenses: Not very hard  Food Insecurity: No Food Insecurity (03/18/2022)   Hunger Vital Sign    Worried About Running Out of Food in the Last Year: Never true    Ran Out of Food in the Last Year: Never true  Transportation Needs: No Transportation Needs (03/18/2022)   PRAPARE - Administrator, Civil Service (Medical): No    Lack of Transportation (Non-Medical): No  Physical Activity: Insufficiently Active (03/18/2022)   Exercise Vital Sign    Days of Exercise per Week: 2 days    Minutes of Exercise per Session: 20 min  Stress: No Stress Concern Present (03/18/2022)   [employer] of Occupational  Health - Occupational Stress Questionnaire    Feeling of Stress : Not at all  Social Connections: Socially Isolated (03/18/2022)   Social Connection and Isolation Panel [NHANES]    Frequency of Communication with Friends and Family: More than three times a week    Frequency of Social Gatherings with Friends and Family: Three times a week    Attends Religious Services: Never    Active Member of Clubs or Organizations: No    Attends Banker Meetings:  Never    Marital Status: Widowed  Intimate Partner Violence: Not At Risk (03/18/2022)   Humiliation, Afraid, Rape, and Kick questionnaire    Fear of Current or Ex-Partner: No    Emotionally Abused: No    Physically Abused: No    Sexually Abused: No    Allergies  Allergen Reactions   Bactrim [Sulfamethoxazole-Trimethoprim] Hives   Other Anaphylaxis and Other (See Comments)   Beta Adrenergic Blockers Diarrhea and Other (See Comments)    Does not recall an allergy   Lasix [Furosemide] Rash   Penicillin G     Other reaction(s): Unknown   Penicillin G Sodium     Other reaction(s): Unknown   Cefzil [Cefprozil] Nausea Only    Patient does recall allergy   Ciprofloxacin Nausea And Vomiting, Rash and Other (See Comments)    Body aches    Dexamethasone Swelling   Gabapentin Swelling   Methocarbamol Swelling and Rash   Neomycin Other (See Comments)    Other reaction(s): Unknown   Penicillins Swelling and Rash    Has patient had a PCN reaction causing immediate rash, facial/tongue/throat swelling, SOB or lightheadedness with hypotension: yes Has patient had a PCN reaction causing severe rash involving mucus membranes or skin necrosis: unknown Has patient had a PCN reaction that required hospitalization: no Has patient had a PCN reaction occurring within the last 10 years: No If all of the above answers are "NO", then may proceed with Cephalosporin use.    Sulfamethoxazole Rash   Tetracyclines & Related Itching    Current  Outpatient Medications  Medication Sig Dispense Refill   albuterol (PROVENTIL) (2.5 MG/3ML) 0.083% nebulizer solution Take 3 mLs (2.5 mg total) by nebulization every 6 (six) hours as needed for wheezing or shortness of breath. 360 mL 5   albuterol (VENTOLIN HFA) 108 (90 Base) MCG/ACT inhaler Inhale 1-2 puffs into the lungs every 6 (six) hours as needed for wheezing or shortness of breath. 18 g 5   alfuzosin (UROXATRAL) 10 MG 24 hr tablet Take 1 tablet (10 mg total) by mouth daily with breakfast. 90 tablet 3   cetirizine (ZYRTEC) 10 MG tablet Take 10 mg by mouth daily.     COMBIGAN 0.2-0.5 % ophthalmic solution Apply 1 drop to eye 2 (two) times daily.     desonide (DESOWEN) 0.05 % cream Apply topically 2 (two) times daily.     esomeprazole (NEXIUM) 20 MG capsule Take 20 mg by mouth daily.     fluticasone-salmeterol (ADVAIR HFA) 230-21 MCG/ACT inhaler Inhale 2 puffs into the lungs 2 (two) times daily. 12 g 5   hydroxypropyl methylcellulose / hypromellose (ISOPTO TEARS / GONIOVISC) 2.5 % ophthalmic solution Place 1 drop into both eyes 3 (three) times daily as needed for dry eyes.     LORazepam (ATIVAN) 0.5 MG tablet Take 1 tablet (0.5 mg total) by mouth at bedtime as needed for anxiety. 30 tablet 3   lovastatin (MEVACOR) 40 MG tablet TAKE 1 TABLET BY MOUTH DAILY. (Patient taking differently: Take 40 mg by mouth at bedtime.) 90 tablet 0   metroNIDAZOLE (METROGEL) 0.75 % gel Apply topically.     mupirocin ointment (BACTROBAN) 2 % Apply topically 3 (three) times daily.     nitroGLYCERIN (NITROSTAT) 0.4 MG SL tablet Place 1 tablet (0.4 mg total) under the tongue every 5 (five) minutes as needed for chest pain. (Patient taking differently: Place 0.4 mg under the tongue every 5 (five) minutes x 3 doses as needed for chest pain (if no  relief after 3rd dose, proceed to ED or call 911).) 25 tablet 5   oxymetazoline (AFRIN) 0.05 % nasal spray Place 1 spray into both nostrils 2 (two) times daily.      Spacer/Aero-Holding Rudean Curt Use as directed 1 each 0   No current facility-administered medications for this visit.    REVIEW OF SYSTEMS:  [X]  denotes positive finding, [ ]  denotes negative finding Cardiac  Comments:  Chest pain or chest pressure: ***   Shortness of breath upon exertion:    Short of breath when lying flat:    Irregular heart rhythm:        Vascular    Pain in calf, thigh, or hip brought on by ambulation:    Pain in feet at night that wakes you up from your sleep:     Blood clot in your veins:    Leg swelling:         Pulmonary    Oxygen at home:    Productive cough:     Wheezing:         Neurologic    Sudden weakness in arms or legs:     Sudden numbness in arms or legs:     Sudden onset of difficulty speaking or slurred speech:    Temporary loss of vision in one eye:     Problems with dizziness:         Gastrointestinal    Blood in stool:     Vomited blood:         Genitourinary    Burning when urinating:     Blood in urine:        Psychiatric    Major depression:         Hematologic    Bleeding problems:    Problems with blood clotting too easily:        Skin    Rashes or ulcers:        Constitutional    Fever or chills:      PHYSICAL EXAM: There were no vitals filed for this visit.  GENERAL: The patient is a well-nourished male, in no acute distress. The vital signs are documented above. CARDIAC: There is a regular rate and rhythm.  VASCULAR: *** PULMONARY: There is good air exchange bilaterally without wheezing or rales. ABDOMEN: Soft and non-tender with normal pitched bowel sounds.  MUSCULOSKELETAL: There are no major deformities or cyanosis. NEUROLOGIC: No focal weakness or paresthesias are detected. SKIN: There are no ulcers or rashes noted. PSYCHIATRIC: The patient has a normal affect.  DATA:   ***  Assessment/Plan:  ***   Cephus Shelling, MD [facility] Office:  [phone]

## 2022-12-14 ENCOUNTER — Ambulatory Visit (INDEPENDENT_AMBULATORY_CARE_PROVIDER_SITE_OTHER): Payer: Medicare Other | Admitting: Vascular Surgery

## 2022-12-14 ENCOUNTER — Encounter: Payer: Self-pay | Admitting: Vascular Surgery

## 2022-12-14 VITALS — BP 149/94 | HR 66 | Temp 97.6°F | Resp 18 | Ht 73.0 in | Wt 209.0 lb

## 2022-12-14 DIAGNOSIS — I714 Abdominal aortic aneurysm, without rupture, unspecified: Secondary | ICD-10-CM | POA: Diagnosis not present

## 2022-12-15 ENCOUNTER — Other Ambulatory Visit: Payer: Self-pay

## 2022-12-15 DIAGNOSIS — I714 Abdominal aortic aneurysm, without rupture, unspecified: Secondary | ICD-10-CM

## 2022-12-16 ENCOUNTER — Telehealth: Payer: Self-pay

## 2022-12-16 NOTE — Telephone Encounter (Signed)
Pt called to let us know he is having steroid injection in his knee next week and is waiting to get scheduled for CT scan. He wanted to confirm this was okay-no further questions/concerns.

## 2022-12-21 ENCOUNTER — Ambulatory Visit (HOSPITAL_COMMUNITY)
Admission: RE | Admit: 2022-12-21 | Discharge: 2022-12-21 | Disposition: A | Discharge status: Home / Self Care | Payer: Medicare Other | Source: Ambulatory Visit | Attending: Gastroenterology | Admitting: Gastroenterology

## 2022-12-21 ENCOUNTER — Encounter (INDEPENDENT_AMBULATORY_CARE_PROVIDER_SITE_OTHER): Payer: Self-pay | Admitting: Gastroenterology

## 2022-12-21 ENCOUNTER — Ambulatory Visit (INDEPENDENT_AMBULATORY_CARE_PROVIDER_SITE_OTHER): Payer: Medicare Other | Admitting: Gastroenterology

## 2022-12-21 VITALS — BP 150/92 | HR 79 | Temp 97.5°F | Ht 73.0 in | Wt 208.6 lb

## 2022-12-21 DIAGNOSIS — Z8719 Personal history of other diseases of the digestive system: Secondary | ICD-10-CM | POA: Insufficient documentation

## 2022-12-21 DIAGNOSIS — R1031 Right lower quadrant pain: Secondary | ICD-10-CM | POA: Diagnosis not present

## 2022-12-21 DIAGNOSIS — Z860100 Personal history of colon polyps, unspecified: Secondary | ICD-10-CM | POA: Insufficient documentation

## 2022-12-21 DIAGNOSIS — K5900 Constipation, unspecified: Secondary | ICD-10-CM | POA: Diagnosis not present

## 2022-12-21 DIAGNOSIS — K5904 Chronic idiopathic constipation: Secondary | ICD-10-CM | POA: Insufficient documentation

## 2022-12-21 HISTORY — DX: Personal history of other diseases of the digestive system: Z87.19

## 2022-12-21 MED ORDER — POLYETHYLENE GLYCOL 3350 17 G PO PACK
17.0000 g | PACK | Freq: Two times a day (BID) | ORAL | 0 refills | Status: AC
Start: 2022-12-21 — End: 2023-03-21

## 2022-12-21 MED ORDER — PSYLLIUM 58.6 % PO PACK
1.0000 | PACK | Freq: Two times a day (BID) | ORAL | 2 refills | Status: AC
Start: 2022-12-21 — End: 2023-03-21

## 2022-12-21 NOTE — Patient Instructions (Addendum)
It was very nice to meet you today, as dicussed with will plan for the following :  1) Xray  2) metamucil and miralax

## 2022-12-21 NOTE — Progress Notes (Signed)
[name] [name] , M.D. Gastroenterology & Hepatology [facility] Gastroenterology [address] [place], [place] [address] Primary Care Physician: [name] Sams, DO [address] [name] [address]  Chief Complaint: Abdominal pain  History of Present Illness: [name] [name] is a 82 y.o. male AAA, coronary artery disease , hyperlipidemia ,GERD, hypertension presents for evaluation of abdominal pain  Patient reports that for the past few years he has recurrent right lower quadrant abdominal pain and the frequency is increasing recently.  Reports associated right lower quadrant nonradiating worsens with constipation and sometimes worsens with movement.  Denies any blood in the stool.  Patient reports Bristol stool scale 1-2 every day which is followed liquid stools.  Patient reports having right lower quadrant and inguinal hernia repair and right lower quadrant bowel resection many years ago. The patient denies having any nausea, vomiting, fever, chills, hematochezia, melena, hematemesis, , diarrhea, jaundice, pruritus or weight loss.  Patient at one time was diagnosed with acute pancreatitis even though he does not drink alcohol  Last EGD: none  Last Colonoscopy:2016  Report not available   FHx: neg for any gastrointestinal/liver disease, no malignancies Social: neg smoking, alcohol or illicit drug use Surgical: Right bowel resection and inguinal hernia repair  Past Medical History: Past Medical History:  Diagnosis Date   AAA (abdominal aortic aneurysm) (HCC)    needs yearly ultrasound   Allergy    Anemia    Arthritis    Asthma    BCC (basal cell carcinoma) 08/18/1989   left shoulder blad, upper right arm, left arm beyond elbow, c&d   BCC (basal cell carcinoma) 01/31/1992   Posterior neck, curetx3, 65fu   BCC (basal cell carcinoma) 11/22/2001   mid forehead, cx3, excision, right forearm, cx3, 62fu   BCC (basal cell carcinoma) 10/09/2003    mid forehead, MOHs   BCC (basal cell carcinoma) 08/15/2008   upper left back, biopsy   BPH (benign prostatic hyperplasia)    CAD (coronary artery disease)    Cancer (HCC)    skin cancer   COPD (chronic obstructive pulmonary disease) (HCC)    Dysrhythmia    pt. states it can be fast at times   GERD (gastroesophageal reflux disease)    Glaucoma    HOH (hard of hearing)    Hypercholesterolemia    Hypertension    Impaired fasting glucose    Low back pain    Melanoma in situ (HCC) 10/09/2003   left chin, MOHs   MI (myocardial infarction) (HCC) 1999   SCC (squamous cell carcinoma) 07/03/2014   in situ, behind left ear, cx3, cautery, 15fu   SCC (squamous cell carcinoma) 07/03/2014   well diff, left forearm, biopsy, cx1, cautery   SCC (squamous cell carcinoma) 07/20/2017   in situ, left upper arm, cx3, 22fu   SCC (squamous cell carcinoma) 01/10/2019   in situ, left post shoulder, cx3, 63fu   SCC (squamous cell carcinoma) 11/22/2001   left forearm distal, left forearm, cx3, 95fu   SCC (squamous cell carcinoma) 10/09/2003   Bowens, left ear post, clear per st, right cheek clear   SCC (squamous cell carcinoma) 03/30/2004   in situ, left upper arm, cx3, 16fu   SCC (squamous cell carcinoma) 03/08/2005   in situ, right cheek, mid upper forehead, cx3, 108fu   SCC (squamous cell carcinoma) 06/08/2006   in situ, left shoulder, cx3, 88fu   SCC (squamous cell carcinoma) 05/05/2010   right inner wrist, biopsy  SCC (squamous cell carcinoma) 09/13/2013   in situ, right crown scalp, front scalp, biopsy   Thrush     Past Surgical History: Past Surgical History:  Procedure Laterality Date   BACK SURGERY     x 3   CARDIAC CATHETERIZATION     angioplasty   CATARACT EXTRACTION W/PHACO  03/20/2012   Procedure: CATARACT EXTRACTION PHACO AND INTRAOCULAR LENS PLACEMENT (IOC);  Surgeon: Susa Simmonds, MD;  Location: [facility];  Service: Ophthalmology;  Laterality: Right;  CDE:  8.45   CATARACT  EXTRACTION W/PHACO Left 04/02/2013   Procedure: CATARACT EXTRACTION PHACO AND INTRAOCULAR LENS PLACEMENT (IOC);  Surgeon: Susa Simmonds, MD;  Location: [facility];  Service: Ophthalmology;  Laterality: Left;  CDE:  6.50   CHOLECYSTECTOMY  2000   COLONOSCOPY  2009   repeat 5 years   ESOPHAGOGASTRODUODENOSCOPY     HERNIA REPAIR Left    inguinal   INGUINAL HERNIA REPAIR Right 03/28/2020   Procedure: Right Inguinal Herniorrhaphy with Mesh;  Surgeon: Franky Macho, MD;  Location: [facility];  Service: General;  Laterality: Right;   LAPAROSCOPIC PARTIAL COLECTOMY N/A 06/11/2013   Procedure: LAPAROSCOPIC HAND ASSISTED PARTIAL COLECTOMY;  Surgeon: Dalia Heading, MD;  Location: [facility];  Service: General;  Laterality: N/A;   NASAL ENDOSCOPY WITH EPISTAXIS CONTROL Bilateral 02/11/2020   Procedure: NASAL ENDOSCOPY WITH EPISTAXIS CONTROL;  Surgeon: Newman Pies, MD;  Location: [facility];  Service: ENT;  Laterality: Bilateral;   right eye detached retina Bilateral    SPINAL FUSION  2016   YAG LASER APPLICATION Left 05/06/2014   Procedure: YAG LASER APPLICATION;  Surgeon: Susa Simmonds, MD;  Location: [facility];  Service: Ophthalmology;  Laterality: Left;    Family History: Family History  Problem Relation Age of Onset   Hypertension Mother    COPD Father    Cancer Brother        brain    Social History: Social History   Tobacco Use  Smoking Status Former   Current packs/day: 0.00   Average packs/day: 1 pack/day for 35.0 years (35.0 ttl pk-yrs)   Types: Cigarettes   Start date: 03/27/1968   Quit date: 03/28/2003   Years since quitting: 19.7  Smokeless Tobacco Never   Social History   Substance and Sexual Activity  Alcohol Use No   Alcohol/week: 0.0 standard drinks of alcohol   Social History   Substance and Sexual Activity  Drug Use No    Allergies: Allergies  Allergen Reactions   Bactrim [Sulfamethoxazole-Trimethoprim] Hives   Other Anaphylaxis and Other (See  Comments)   Beta Adrenergic Blockers Diarrhea and Other (See Comments)    Does not recall an allergy   Lasix [Furosemide] Rash   Penicillin G     Other reaction(s): Unknown   Penicillin G Sodium     Other reaction(s): Unknown   Cefzil [Cefprozil] Nausea Only    Patient does recall allergy   Ciprofloxacin Nausea And Vomiting, Rash and Other (See Comments)    Body aches    Dexamethasone Swelling   Gabapentin Swelling   Methocarbamol Swelling and Rash   Neomycin Other (See Comments)    Other reaction(s): Unknown   Penicillins Swelling and Rash    Has patient had a PCN reaction causing immediate rash, facial/tongue/throat swelling, SOB or lightheadedness with hypotension: yes Has patient had a PCN reaction causing severe rash involving mucus membranes or skin necrosis: unknown Has patient had a PCN reaction that required hospitalization: no Has patient  had a PCN reaction occurring within the last 10 years: No If all of the above answers are "NO", then may proceed with Cephalosporin use.    Sulfamethoxazole Rash   Tetracyclines & Related Itching    Medications: Current Outpatient Medications  Medication Sig Dispense Refill   albuterol (PROVENTIL) (2.5 MG/3ML) 0.083% nebulizer solution Take 3 mLs (2.5 mg total) by nebulization every 6 (six) hours as needed for wheezing or shortness of breath. 360 mL 5   albuterol (VENTOLIN HFA) 108 (90 Base) MCG/ACT inhaler Inhale 1-2 puffs into the lungs every 6 (six) hours as needed for wheezing or shortness of breath. 18 g 5   alfuzosin (UROXATRAL) 10 MG 24 hr tablet Take 1 tablet (10 mg total) by mouth daily with breakfast. 90 tablet 3   cetirizine (ZYRTEC) 10 MG tablet Take 10 mg by mouth daily.     COMBIGAN 0.2-0.5 % ophthalmic solution Apply 1 drop to eye 2 (two) times daily.     desonide (DESOWEN) 0.05 % cream Apply topically 2 (two) times daily.     esomeprazole (NEXIUM) 20 MG capsule Take 20 mg by mouth daily.     fluticasone-salmeterol  (ADVAIR HFA) 230-21 MCG/ACT inhaler Inhale 2 puffs into the lungs 2 (two) times daily. 12 g 5   hydroxypropyl methylcellulose / hypromellose (ISOPTO TEARS / GONIOVISC) 2.5 % ophthalmic solution Place 1 drop into both eyes 3 (three) times daily as needed for dry eyes.     LORazepam (ATIVAN) 0.5 MG tablet Take 1 tablet (0.5 mg total) by mouth at bedtime as needed for anxiety. 30 tablet 3   lovastatin (MEVACOR) 40 MG tablet TAKE 1 TABLET BY MOUTH DAILY. (Patient taking differently: Take 40 mg by mouth at bedtime.) 90 tablet 0   nitroGLYCERIN (NITROSTAT) 0.4 MG SL tablet Place 1 tablet (0.4 mg total) under the tongue every 5 (five) minutes as needed for chest pain. (Patient taking differently: Place 0.4 mg under the tongue every 5 (five) minutes x 3 doses as needed for chest pain (if no relief after 3rd dose, proceed to ED or call 911).) 25 tablet 5   oxymetazoline (AFRIN) 0.05 % nasal spray Place 1 spray into both nostrils 2 (two) times daily.     polyethylene glycol (MIRALAX / GLYCOLAX) 17 g packet Take 17 g by mouth 2 (two) times daily. 180 packet 0   psyllium (METAMUCIL) 58.6 % packet Take 1 packet by mouth 2 (two) times daily. 60 packet 2   Spacer/Aero-Holding Rudean Curt Use as directed 1 each 0   No current facility-administered medications for this visit.    Review of Systems: GENERAL: negative for malaise, night sweats HEENT: No changes in hearing or vision, no nose bleeds or other nasal problems. NECK: Negative for lumps, goiter, pain and significant neck swelling RESPIRATORY: Negative for cough, wheezing CARDIOVASCULAR: Negative for chest pain, leg swelling, palpitations, orthopnea GI: SEE HPI MUSCULOSKELETAL: Negative for joint pain or swelling, back pain, and muscle pain. SKIN: Negative for lesions, rash HEMATOLOGY Negative for prolonged bleeding, bruising easily, and swollen nodes. ENDOCRINE: Negative for cold or heat intolerance, polyuria, polydipsia and goiter. NEURO: negative  for tremor, gait imbalance, syncope and seizures. The remainder of the review of systems is noncontributory.   Physical Exam: BP (!) 150/92 (BP Location: Left Arm, Patient Position: Sitting, Cuff Size: Normal)   Pulse 79   Temp (!) 97.5 F (36.4 C) (Temporal)   Ht 6\' 1"  (1.854 m)   Wt 208 lb 9.6 oz (94.6 kg)  BMI 27.52 kg/m  GENERAL: The patient is AO x3, in no acute distress. HEENT: Head is normocephalic and atraumatic. EOMI are intact. Mouth is well hydrated and without lesions. NECK: Supple. No masses LUNGS: Clear to auscultation. No presence of rhonchi/wheezing/rales. Adequate chest expansion HEART: RRR, normal s1 and s2. ABDOMEN: Soft, nontender, no guarding, no peritoneal signs, and nondistended. BS +. No masses. EXTREMITIES: Without any cyanosis, clubbing, rash, lesions or edema. NEUROLOGIC: AOx3, no focal motor deficit. SKIN: no jaundice, no rashes   Imaging/Labs: as above  I personally reviewed and interpreted the available labs, imaging and endoscopic files.  1. No acute abnormality identified in the abdomen or pelvis. 2. Colonic diverticulosis without evidence of acute diverticulitis. 3. Nonobstructing right nephrolithiasis. 4. Unchanged 5.1 cm infrarenal aortic aneurysm. Recommend follow-up every 6 months and vascular consultation. Reference: J Am Coll Radiol 2013;10:789-794. 5.  Aortic Atherosclerosis (ICD10-I70.0).  February 2024 no other enzymes  Repeat CT angio abdomen is ordered  Impression and Plan: [name] [name] is a 82 y.o. male AAA, coronary artery disease , hyperlipidemia ,GERD, hypertension presents for evaluation of abdominal pain  #Right lower quadrant abdominal pain  #Constipation   Patient has chronic right lower quadrant abdominal pain.  This could be from adhesions from previous surgery such as inguinal hernia repair and bowel resection in 2015  Pain increases with movement and constipation.  There could be a component of constipation  leading to abdominal pain and cramps Patient has Bristol stool scale 1-2 followed by diarrhea  Although patient reports last colonoscopy 2015 had a polyp that she had surgically removed, no colonoscopy performed after 2016  Recs: Adequate fluid intake , 8 glass of water daily High fiber diet : Dates, prunes , pears, Kiwi  Miralax BID followed by Daily  Metamucil BID  -Discussed the risk benefit and indication of colonoscopy and given patient comorbidities (abdominal aortic aneurysm and coronary artery disease) and advanced age would like to hold onto endoscopy evaluation at this time.  Patient verbalizes understanding  -If pain continues after controlling constipation per patient develops any red flags such as anemia or hematochezia will consider colonoscopy at that time   #History of Acute pancreatitis Patient does not have history of gallstones and does not drink alcohol/pain diagnosed with acute pancreatitis in 2020  He is getting every 6 month CT abdomen which will be adequate to evaluate pancreas and should get serial hemoglobin A1c  #Hypertension  The patient was found to have elevated blood pressure when vital signs were checked in the office. The blood pressure was rechecked by the nursing staff and it was found be persistently elevated >140/90 mmHg. I personally advised to the patient to follow up closely with PCP for hypertension control.    All questions were answered.      [name] Lawman, MD Gastroenterology and Hepatology [facility] Gastroenterology   This chart has been completed using [facility] Dictation software, and while attempts have been made to ensure accuracy , certain words and phrases may not be transcribed as intended

## 2022-12-22 ENCOUNTER — Encounter: Payer: Self-pay | Admitting: Orthopedic Surgery

## 2022-12-22 ENCOUNTER — Ambulatory Visit (INDEPENDENT_AMBULATORY_CARE_PROVIDER_SITE_OTHER): Payer: Medicare Other | Admitting: Orthopedic Surgery

## 2022-12-22 DIAGNOSIS — M1711 Unilateral primary osteoarthritis, right knee: Secondary | ICD-10-CM | POA: Diagnosis not present

## 2022-12-22 MED ORDER — METHYLPREDNISOLONE ACETATE 40 MG/ML IJ SUSP
40.0000 mg | Freq: Once | INTRAMUSCULAR | Status: AC
  Administered 2022-12-22: 40 mg via INTRA_ARTICULAR

## 2022-12-22 NOTE — Progress Notes (Signed)
Return patient Visit  Assessment: [name] [name] is a 82 y.o. male with the following: 1. Arthritis of right knee  Plan: [name] [name] has advanced degenerative changes in the right knee.  He has had good results following previous injections.  Most recently had a hyaluronic acid injection.  Over the past 3-4 months, he notes progressive worsening of pain in the right knee.  He is interested in a steroid injection today.  This was completed without issues.   Procedure note injection Right knee joint   Verbal consent was obtained to inject the right knee joint  Timeout was completed to confirm the site of injection.  The skin was prepped with alcohol and ethyl chloride was sprayed at the injection site.  A 21-gauge needle was used to inject 40 mg of Depo-Medrol and 1% lidocaine (3 cc) into the right knee using an anterolateral approach.  There were no complications. A sterile bandage was applied.   Follow-up: Return if symptoms worsen or fail to improve.  Subjective:  Chief Complaint  Patient presents with   Injections    Right knee     History of Present Illness: [name] [name] is a 82 y.o. male who returns for evaluation of right knee pain.  I have seen him several times for advanced arthritis in the right knee.  Most recent visit was approximately 1 year ago.  At that time, he completed a series of HA injections.  He had good improvement in the symptoms for several months.  Over the past 3-4 months, his pain has gradually returned.  He is interested in another injection today.   Review of Systems: No fevers or chills No numbness or tingling No chest pain No shortness of breath No bowel or bladder dysfunction No GI distress No headaches   Objective: There were no vitals taken for this visit.  Physical Exam:  General: Elderly male., Alert and oriented., and No acute distress. Gait: Right sided antalgic gait.  Right knee with mild varus alignment overall.  Tenderness  to palpation over the medial joint line.  No effusion.  Range of motion from 3-120 degrees without discomfort.  No increased laxity varus or valgus stress.  Negative Lachman.  Toes are warm and well-perfused.  IMAGING: No new imaging obtained today   New Medications:  Meds ordered this encounter  Medications   methylPREDNISolone acetate (DEPO-MEDROL) injection 40 mg      [name] Barre, MD  12/22/2022 11:48 AM

## 2022-12-22 NOTE — Patient Instructions (Signed)

## 2022-12-28 ENCOUNTER — Ambulatory Visit (HOSPITAL_COMMUNITY)
Admission: RE | Admit: 2022-12-28 | Discharge: 2022-12-28 | Disposition: A | Discharge status: Home / Self Care | Payer: Medicare Other | Source: Ambulatory Visit | Attending: Vascular Surgery | Admitting: Vascular Surgery

## 2022-12-28 DIAGNOSIS — I701 Atherosclerosis of renal artery: Secondary | ICD-10-CM | POA: Diagnosis not present

## 2022-12-28 DIAGNOSIS — I7143 Infrarenal abdominal aortic aneurysm, without rupture: Secondary | ICD-10-CM | POA: Diagnosis not present

## 2022-12-28 DIAGNOSIS — I714 Abdominal aortic aneurysm, without rupture, unspecified: Secondary | ICD-10-CM | POA: Diagnosis not present

## 2022-12-28 DIAGNOSIS — N281 Cyst of kidney, acquired: Secondary | ICD-10-CM | POA: Diagnosis not present

## 2022-12-28 DIAGNOSIS — N289 Disorder of kidney and ureter, unspecified: Secondary | ICD-10-CM | POA: Diagnosis not present

## 2022-12-28 MED ORDER — IOHEXOL 350 MG/ML SOLN
100.0000 mL | Freq: Once | INTRAVENOUS | Status: AC | PRN
  Administered 2022-12-28: 100 mL via INTRAVENOUS

## 2022-12-28 NOTE — Progress Notes (Signed)
Hi [name] [name]   Miralax and Metamucil should not be causing dizziness.  If he is having a lot of diarrhea than he should stop miralax  If Dizziness continue advice to go to the ED.

## 2022-12-29 NOTE — Progress Notes (Signed)
HI [name] [name]   Can you please tell patient that Abdominal xray showed some stool throughout the colon which could also cause abdominal pain   As discussed earlier continue taking Miralax once daily to twice daily depending if he is having diarrhea   Metamucil twice daily  And for abdominal pain its important to follow up the results of the CT Abdomen done yesterday  For now  - Start IBGard 1 tablet every 8-12 hours as needed

## 2022-12-29 NOTE — Progress Notes (Signed)
Discussed with patient per Dr. Tasia Catchings - Abdominal xray showed some stool throughout the colon which could also cause abdominal pain   As discussed earlier continue taking Miralax once daily to twice daily depending if he is having diarrhea   Metamucil twice daily   And for abdominal pain its important to follow up the results of the CT Abdomen done yesterday   For now  - Start IBGard 1 tablet every 8-12 hours as needed

## 2023-01-03 NOTE — Progress Notes (Unsigned)
Patient name: [name] [name] MRN: [id number] DOB: Apr 22, 1941 Sex: male  REASON FOR CONSULT: F/U after CTA  HPI: [name] [name] is a 82 y.o. male, with history of coronary artery disease status post MI, COPD, hypertension, hyperlipidemia that presents for follow-up after CTA for evaluation of AAA.  He was previously seen by Dr. Arbie Cookey on 07/21/2022 with a 5.43 cm abdominal aortic aneurysm.  Dr. Arbie Cookey discussed with him that his size for repair was "on the fence".  He recommended follow-up in 6 months with CTA.  I was contacted by the patient's cardiologist to see him in evaluation by Dr. Eden Emms.  I sent him for CTA.  States he had a previous colon resection for a polyp that was unresectable.  Having a lot of right groin pain from a previous open hernia repair.  Past Medical History:  Diagnosis Date   AAA (abdominal aortic aneurysm) (HCC)    needs yearly ultrasound   Allergy    Anemia    Arthritis    Asthma    BCC (basal cell carcinoma) 08/18/1989   left shoulder blad, upper right arm, left arm beyond elbow, c&d   BCC (basal cell carcinoma) 01/31/1992   Posterior neck, curetx3, 5fu   BCC (basal cell carcinoma) 11/22/2001   mid forehead, cx3, excision, right forearm, cx3, 14fu   BCC (basal cell carcinoma) 10/09/2003   mid forehead, MOHs   BCC (basal cell carcinoma) 08/15/2008   upper left back, biopsy   BPH (benign prostatic hyperplasia)    CAD (coronary artery disease)    Cancer (HCC)    skin cancer   COPD (chronic obstructive pulmonary disease) (HCC)    Dysrhythmia    pt. states it can be fast at times   GERD (gastroesophageal reflux disease)    Glaucoma    HOH (hard of hearing)    Hypercholesterolemia    Hypertension    Impaired fasting glucose    Low back pain    Melanoma in situ (HCC) 10/09/2003   left chin, MOHs   MI (myocardial infarction) (HCC) 1999   SCC (squamous cell carcinoma) 07/03/2014   in situ, behind left ear, cx3, cautery, 86fu   SCC (squamous cell carcinoma)  07/03/2014   well diff, left forearm, biopsy, cx1, cautery   SCC (squamous cell carcinoma) 07/20/2017   in situ, left upper arm, cx3, 73fu   SCC (squamous cell carcinoma) 01/10/2019   in situ, left post shoulder, cx3, 80fu   SCC (squamous cell carcinoma) 11/22/2001   left forearm distal, left forearm, cx3, 64fu   SCC (squamous cell carcinoma) 10/09/2003   [place], left ear post, clear per st, right cheek clear   SCC (squamous cell carcinoma) 03/30/2004   in situ, left upper arm, cx3, 30fu   SCC (squamous cell carcinoma) 03/08/2005   in situ, right cheek, mid upper forehead, cx3, 41fu   SCC (squamous cell carcinoma) 06/08/2006   in situ, left shoulder, cx3, 69fu   SCC (squamous cell carcinoma) 05/05/2010   right inner wrist, biopsy   SCC (squamous cell carcinoma) 09/13/2013   in situ, right crown scalp, front scalp, biopsy   Thrush     Past Surgical History:  Procedure Laterality Date   BACK SURGERY     x 3   CARDIAC CATHETERIZATION     angioplasty   CATARACT EXTRACTION W/PHACO  03/20/2012   Procedure: CATARACT EXTRACTION PHACO AND INTRAOCULAR LENS PLACEMENT (IOC);  Surgeon: Susa Simmonds, MD;  Location: [facility];  Service:  Ophthalmology;  Laterality: Right;  CDE:  8.45   CATARACT EXTRACTION W/PHACO Left 04/02/2013   Procedure: CATARACT EXTRACTION PHACO AND INTRAOCULAR LENS PLACEMENT (IOC);  Surgeon: Susa Simmonds, MD;  Location: [facility];  Service: Ophthalmology;  Laterality: Left;  CDE:  6.50   CHOLECYSTECTOMY  2000   COLONOSCOPY  2009   repeat 5 years   ESOPHAGOGASTRODUODENOSCOPY     HERNIA REPAIR Left    inguinal   INGUINAL HERNIA REPAIR Right 03/28/2020   Procedure: Right Inguinal Herniorrhaphy with Mesh;  Surgeon: Franky Macho, MD;  Location: [facility];  Service: General;  Laterality: Right;   LAPAROSCOPIC PARTIAL COLECTOMY N/A 06/11/2013   Procedure: LAPAROSCOPIC HAND ASSISTED PARTIAL COLECTOMY;  Surgeon: Dalia Heading, MD;  Location: [facility];  Service: General;  Laterality:  N/A;   NASAL ENDOSCOPY WITH EPISTAXIS CONTROL Bilateral 02/11/2020   Procedure: NASAL ENDOSCOPY WITH EPISTAXIS CONTROL;  Surgeon: Newman Pies, MD;  Location: [facility];  Service: ENT;  Laterality: Bilateral;   right eye detached retina Bilateral    SPINAL FUSION  2016   YAG LASER APPLICATION Left 05/06/2014   Procedure: YAG LASER APPLICATION;  Surgeon: Susa Simmonds, MD;  Location: [facility];  Service: Ophthalmology;  Laterality: Left;    Family History  Problem Relation Age of Onset   Hypertension Mother    COPD Father    Cancer Brother        brain    SOCIAL HISTORY: Social History   Socioeconomic History   Marital status: Widowed    Spouse name: Not on file   Number of children: Not on file   Years of education: Not on file   Highest education level: Not on file  Occupational History   Not on file  Tobacco Use   Smoking status: Former    Current packs/day: 0.00    Average packs/day: 1 pack/day for 35.0 years (35.0 ttl pk-yrs)    Types: Cigarettes    Start date: 03/27/1968    Quit date: 03/28/2003    Years since quitting: 19.7   Smokeless tobacco: Never  Vaping Use   Vaping status: Never Used  Substance and Sexual Activity   Alcohol use: No    Alcohol/week: 0.0 standard drinks of alcohol   Drug use: No   Sexual activity: Not Currently    Birth control/protection: None  Other Topics Concern   Not on file  Social History Narrative   Not on file   Social Determinants of Health   Financial Resource Strain: Low Risk  (03/18/2022)   Overall Financial Resource Strain (CARDIA)    Difficulty of Paying Living Expenses: Not very hard  Food Insecurity: No Food Insecurity (03/18/2022)   Hunger Vital Sign    Worried About Running Out of Food in the Last Year: Never true    Ran Out of Food in the Last Year: Never true  Transportation Needs: No Transportation Needs (03/18/2022)   PRAPARE - Administrator, Civil Service (Medical): No    Lack of  Transportation (Non-Medical): No  Physical Activity: Insufficiently Active (03/18/2022)   Exercise Vital Sign    Days of Exercise per Week: 2 days    Minutes of Exercise per Session: 20 min  Stress: No Stress Concern Present (03/18/2022)   [employer] of Occupational Health - Occupational Stress Questionnaire    Feeling of Stress : Not at all  Social Connections: Socially Isolated (03/18/2022)   Social Connection and Isolation Panel [NHANES]  Frequency of Communication with Friends and Family: More than three times a week    Frequency of Social Gatherings with Friends and Family: Three times a week    Attends Religious Services: Never    Active Member of Clubs or Organizations: No    Attends Banker Meetings: Never    Marital Status: Widowed  Intimate Partner Violence: Not At Risk (03/18/2022)   Humiliation, Afraid, Rape, and Kick questionnaire    Fear of Current or Ex-Partner: No    Emotionally Abused: No    Physically Abused: No    Sexually Abused: No    Allergies  Allergen Reactions   Bactrim [Sulfamethoxazole-Trimethoprim] Hives   Other Anaphylaxis and Other (See Comments)   Beta Adrenergic Blockers Diarrhea and Other (See Comments)    Does not recall an allergy   Lasix [Furosemide] Rash   Penicillin G     Other reaction(s): Unknown   Penicillin G Sodium     Other reaction(s): Unknown   Cefzil [Cefprozil] Nausea Only    Patient does recall allergy   Ciprofloxacin Nausea And Vomiting, Rash and Other (See Comments)    Body aches    Dexamethasone Swelling   Gabapentin Swelling   Methocarbamol Swelling and Rash   Neomycin Other (See Comments)    Other reaction(s): Unknown   Penicillins Swelling and Rash    Has patient had a PCN reaction causing immediate rash, facial/tongue/throat swelling, SOB or lightheadedness with hypotension: yes Has patient had a PCN reaction causing severe rash involving mucus membranes or skin necrosis: unknown Has patient had  a PCN reaction that required hospitalization: no Has patient had a PCN reaction occurring within the last 10 years: No If all of the above answers are "NO", then may proceed with Cephalosporin use.    Sulfamethoxazole Rash   Tetracyclines & Related Itching    Current Outpatient Medications  Medication Sig Dispense Refill   albuterol (PROVENTIL) (2.5 MG/3ML) 0.083% nebulizer solution Take 3 mLs (2.5 mg total) by nebulization every 6 (six) hours as needed for wheezing or shortness of breath. 360 mL 5   albuterol (VENTOLIN HFA) 108 (90 Base) MCG/ACT inhaler Inhale 1-2 puffs into the lungs every 6 (six) hours as needed for wheezing or shortness of breath. 18 g 5   alfuzosin (UROXATRAL) 10 MG 24 hr tablet Take 1 tablet (10 mg total) by mouth daily with breakfast. 90 tablet 3   cetirizine (ZYRTEC) 10 MG tablet Take 10 mg by mouth daily.     COMBIGAN 0.2-0.5 % ophthalmic solution Apply 1 drop to eye 2 (two) times daily.     desonide (DESOWEN) 0.05 % cream Apply topically 2 (two) times daily.     esomeprazole (NEXIUM) 20 MG capsule Take 20 mg by mouth daily.     fluticasone-salmeterol (ADVAIR HFA) 230-21 MCG/ACT inhaler Inhale 2 puffs into the lungs 2 (two) times daily. 12 g 5   hydroxypropyl methylcellulose / hypromellose (ISOPTO TEARS / GONIOVISC) 2.5 % ophthalmic solution Place 1 drop into both eyes 3 (three) times daily as needed for dry eyes.     LORazepam (ATIVAN) 0.5 MG tablet Take 1 tablet (0.5 mg total) by mouth at bedtime as needed for anxiety. 30 tablet 3   lovastatin (MEVACOR) 40 MG tablet TAKE 1 TABLET BY MOUTH DAILY. (Patient taking differently: Take 40 mg by mouth at bedtime.) 90 tablet 0   nitroGLYCERIN (NITROSTAT) 0.4 MG SL tablet Place 1 tablet (0.4 mg total) under the tongue every 5 (five) minutes as  needed for chest pain. (Patient taking differently: Place 0.4 mg under the tongue every 5 (five) minutes x 3 doses as needed for chest pain (if no relief after 3rd dose, proceed to ED or  call 911).) 25 tablet 5   oxymetazoline (AFRIN) 0.05 % nasal spray Place 1 spray into both nostrils 2 (two) times daily.     polyethylene glycol (MIRALAX / GLYCOLAX) 17 g packet Take 17 g by mouth 2 (two) times daily. 180 packet 0   psyllium (METAMUCIL) 58.6 % packet Take 1 packet by mouth 2 (two) times daily. 60 packet 2   Spacer/Aero-Holding Rudean Curt Use as directed 1 each 0   No current facility-administered medications for this visit.    REVIEW OF SYSTEMS:  [X]  denotes positive finding, [ ]  denotes negative finding Cardiac  Comments:  Chest pain or chest pressure:    Shortness of breath upon exertion:    Short of breath when lying flat:    Irregular heart rhythm:        Vascular    Pain in calf, thigh, or hip brought on by ambulation:    Pain in feet at night that wakes you up from your sleep:     Blood clot in your veins:    Leg swelling:         Pulmonary    Oxygen at home:    Productive cough:     Wheezing:         Neurologic    Sudden weakness in arms or legs:     Sudden numbness in arms or legs:     Sudden onset of difficulty speaking or slurred speech:    Temporary loss of vision in one eye:     Problems with dizziness:         Gastrointestinal    Blood in stool:     Vomited blood:         Genitourinary    Burning when urinating:     Blood in urine:        Psychiatric    Major depression:         Hematologic    Bleeding problems:    Problems with blood clotting too easily:        Skin    Rashes or ulcers:        Constitutional    Fever or chills:      PHYSICAL EXAM: There were no vitals filed for this visit.  GENERAL: The patient is a well-nourished male, in no acute distress. The vital signs are documented above. CARDIAC: There is a regular rate and rhythm.  VASCULAR:  Bilateral femoral pulses palpable Left DP palpable No palpable right pedal pulses PULMONARY: No respiratory distress. ABDOMEN: Soft and non-tender. MUSCULOSKELETAL:  There are no major deformities or cyanosis. NEUROLOGIC: No focal weakness or paresthesias are detected. SKIN: There are no ulcers or rashes noted. PSYCHIATRIC: The patient has a normal affect.  DATA:   CTA reviewed 12/28/22: 5.1 cm AAA by my measurement    AAA duplex 07/21/22 shows 5.43 cm abdominal aortic aneurysm  Assessment/Plan:  82 y.o. male, with history of coronary artery disease status post MI, COPD, hypertension, hyperlipidemia that presents for follow-up after CTA for evaluation of AAA.  He was last seen by Dr. Arbie Cookey on 07/21/2022 with a 5.43 cm abdominal aortic aneurysm.  Dr. Arbie Cookey discussed with him that his size for repair was "on the fence".  He recommended follow-up in 6 months with CTA.  Cephus Shelling, MD [facility] Office: [phone]

## 2023-01-04 ENCOUNTER — Encounter: Payer: Self-pay | Admitting: Vascular Surgery

## 2023-01-04 ENCOUNTER — Ambulatory Visit: Payer: Medicare Other | Admitting: Vascular Surgery

## 2023-01-04 ENCOUNTER — Other Ambulatory Visit: Payer: Self-pay | Admitting: Family Medicine

## 2023-01-04 VITALS — BP 131/86 | HR 78 | Temp 97.7°F | Ht 73.0 in | Wt 206.0 lb

## 2023-01-04 DIAGNOSIS — E785 Hyperlipidemia, unspecified: Secondary | ICD-10-CM

## 2023-01-04 DIAGNOSIS — I714 Abdominal aortic aneurysm, without rupture, unspecified: Secondary | ICD-10-CM

## 2023-01-12 ENCOUNTER — Other Ambulatory Visit: Payer: Self-pay | Admitting: Family Medicine

## 2023-01-12 DIAGNOSIS — G4700 Insomnia, unspecified: Secondary | ICD-10-CM

## 2023-01-13 ENCOUNTER — Telehealth: Payer: Self-pay | Admitting: Family Medicine

## 2023-01-13 NOTE — Telephone Encounter (Signed)
Refill on LORazepam (ATIVAN) 0.5 MG tablet  send to [facility]

## 2023-01-14 ENCOUNTER — Encounter: Payer: Self-pay | Admitting: Family Medicine

## 2023-01-14 ENCOUNTER — Other Ambulatory Visit: Payer: Self-pay | Admitting: Family Medicine

## 2023-01-14 DIAGNOSIS — G4700 Insomnia, unspecified: Secondary | ICD-10-CM

## 2023-01-14 MED ORDER — LORAZEPAM 0.5 MG PO TABS
0.5000 mg | ORAL_TABLET | Freq: Every evening | ORAL | 3 refills | Status: DC | PRN
Start: 2023-01-14 — End: 2023-05-02

## 2023-01-25 ENCOUNTER — Other Ambulatory Visit: Payer: Self-pay

## 2023-01-25 DIAGNOSIS — I714 Abdominal aortic aneurysm, without rupture, unspecified: Secondary | ICD-10-CM

## 2023-01-26 ENCOUNTER — Telehealth: Payer: Self-pay | Admitting: Pulmonary Disease

## 2023-01-26 ENCOUNTER — Ambulatory Visit: Payer: Medicare Other | Admitting: Family Medicine

## 2023-01-26 NOTE — Telephone Encounter (Signed)
Patient states having symptoms of shortness of breath, cough and mucus. Pharmacy is [facility]. Patient phone number is [phone].

## 2023-01-27 ENCOUNTER — Ambulatory Visit (INDEPENDENT_AMBULATORY_CARE_PROVIDER_SITE_OTHER): Payer: Medicare Other | Admitting: Family Medicine

## 2023-01-27 ENCOUNTER — Encounter: Payer: Self-pay | Admitting: Family Medicine

## 2023-01-27 VITALS — BP 129/76 | HR 72 | Temp 97.9°F | Wt 207.4 lb

## 2023-01-27 DIAGNOSIS — I10 Essential (primary) hypertension: Secondary | ICD-10-CM

## 2023-01-27 DIAGNOSIS — Z23 Encounter for immunization: Secondary | ICD-10-CM

## 2023-01-27 DIAGNOSIS — R1031 Right lower quadrant pain: Secondary | ICD-10-CM

## 2023-01-27 NOTE — Patient Instructions (Signed)
Follow up with GI.  I would hold off on Colonoscopy.  COVID vaccine at the pharmacy.  Follow up in 3 months.

## 2023-01-28 ENCOUNTER — Encounter: Payer: Self-pay | Admitting: Urology

## 2023-01-28 ENCOUNTER — Other Ambulatory Visit: Payer: Self-pay | Admitting: Pulmonary Disease

## 2023-01-28 ENCOUNTER — Ambulatory Visit (INDEPENDENT_AMBULATORY_CARE_PROVIDER_SITE_OTHER): Payer: Medicare Other | Admitting: Urology

## 2023-01-28 VITALS — BP 134/76 | HR 80

## 2023-01-28 DIAGNOSIS — N401 Enlarged prostate with lower urinary tract symptoms: Secondary | ICD-10-CM

## 2023-01-28 DIAGNOSIS — R351 Nocturia: Secondary | ICD-10-CM

## 2023-01-28 LAB — URINALYSIS, ROUTINE W REFLEX MICROSCOPIC
Bilirubin, UA: NEGATIVE
Glucose, UA: NEGATIVE
Ketones, UA: NEGATIVE
Leukocytes,UA: NEGATIVE
Nitrite, UA: NEGATIVE
Protein,UA: NEGATIVE
RBC, UA: NEGATIVE
Specific Gravity, UA: 1.01 (ref 1.005–1.030)
Urobilinogen, Ur: 1 mg/dL (ref 0.2–1.0)
pH, UA: 6.5 (ref 5.0–7.5)

## 2023-01-28 LAB — BLADDER SCAN AMB NON-IMAGING: Scan Result: 82

## 2023-01-28 MED ORDER — AZITHROMYCIN 250 MG PO TABS: ORAL_TABLET | ORAL | 0 refills | Status: DC

## 2023-01-28 MED ORDER — ALFUZOSIN HCL ER 10 MG PO TB24: 10.0000 mg | ORAL_TABLET | Freq: Every day | ORAL | 3 refills | Status: DC

## 2023-01-28 NOTE — Progress Notes (Unsigned)
post void residual=82

## 2023-01-28 NOTE — Assessment & Plan Note (Signed)
No red flags.  Advised to follow-up with GI.  Patient can consider colonoscopy although I doubt that it will be fruitful.  Supportive care.

## 2023-01-28 NOTE — Telephone Encounter (Signed)
Called and spoke with pt who states he feels like he has been coughing but has been having a hard time trying to get the phlegm up. Has been taking mucinex. When he is able to get any phlegm up, he said it is dark in color. Pt also has been SOB. Denies any fever. States he usually gets like this around this time of the year.  Pt is requesting to have zpak sent to the pharmacy to see if it would help with his symptoms.  Dr. Val Eagle, please advise.

## 2023-01-28 NOTE — Progress Notes (Unsigned)
01/28/2023 10:28 AM   [name] [name] June 19, 1940 [id number]  Referring provider: Tommie Sams, DO [address] [place],  [place] [address]  Followup BPH   HPI: [name] [name] is a 82yo here for followup for BPH with nocturia. IPSS 14 QOL 3 on uroxatral 10mg . Nocturia 2-3x. No straining to urinate. Urine stream strong. NO hematuria or dysuria   PMH: Past Medical History:  Diagnosis Date   AAA (abdominal aortic aneurysm) (HCC)    needs yearly ultrasound   Allergy    Anemia    Arthritis    Asthma    BCC (basal cell carcinoma) 08/18/1989   left shoulder blad, upper right arm, left arm beyond elbow, c&d   BCC (basal cell carcinoma) 01/31/1992   Posterior neck, curetx3, 56fu   BCC (basal cell carcinoma) 11/22/2001   mid forehead, cx3, excision, right forearm, cx3, 31fu   BCC (basal cell carcinoma) 10/09/2003   mid forehead, MOHs   BCC (basal cell carcinoma) 08/15/2008   upper left back, biopsy   BPH (benign prostatic hyperplasia)    CAD (coronary artery disease)    Cancer (HCC)    skin cancer   COPD (chronic obstructive pulmonary disease) (HCC)    Dysrhythmia    pt. states it can be fast at times   GERD (gastroesophageal reflux disease)    Glaucoma    HOH (hard of hearing)    Hypercholesterolemia    Hypertension    Impaired fasting glucose    Low back pain    Melanoma in situ (HCC) 10/09/2003   left chin, MOHs   MI (myocardial infarction) (HCC) 1999   SCC (squamous cell carcinoma) 07/03/2014   in situ, behind left ear, cx3, cautery, 45fu   SCC (squamous cell carcinoma) 07/03/2014   well diff, left forearm, biopsy, cx1, cautery   SCC (squamous cell carcinoma) 07/20/2017   in situ, left upper arm, cx3, 2fu   SCC (squamous cell carcinoma) 01/10/2019   in situ, left post shoulder, cx3, 12fu   SCC (squamous cell carcinoma) 11/22/2001   left forearm distal, left forearm, cx3, 68fu   SCC (squamous cell carcinoma) 10/09/2003   [place], left ear post, clear per st, right cheek  clear   SCC (squamous cell carcinoma) 03/30/2004   in situ, left upper arm, cx3, 11fu   SCC (squamous cell carcinoma) 03/08/2005   in situ, right cheek, mid upper forehead, cx3, 7fu   SCC (squamous cell carcinoma) 06/08/2006   in situ, left shoulder, cx3, 62fu   SCC (squamous cell carcinoma) 05/05/2010   right inner wrist, biopsy   SCC (squamous cell carcinoma) 09/13/2013   in situ, right crown scalp, front scalp, biopsy   Thrush     Surgical History: Past Surgical History:  Procedure Laterality Date   BACK SURGERY     x 3   CARDIAC CATHETERIZATION     angioplasty   CATARACT EXTRACTION W/PHACO  03/20/2012   Procedure: CATARACT EXTRACTION PHACO AND INTRAOCULAR LENS PLACEMENT (IOC);  Surgeon: Susa Simmonds, MD;  Location: [facility];  Service: Ophthalmology;  Laterality: Right;  CDE:  8.45   CATARACT EXTRACTION W/PHACO Left 04/02/2013   Procedure: CATARACT EXTRACTION PHACO AND INTRAOCULAR LENS PLACEMENT (IOC);  Surgeon: Susa Simmonds, MD;  Location: [facility];  Service: Ophthalmology;  Laterality: Left;  CDE:  6.50   CHOLECYSTECTOMY  2000   COLONOSCOPY  2009   repeat 5 years   ESOPHAGOGASTRODUODENOSCOPY     HERNIA REPAIR Left  inguinal   INGUINAL HERNIA REPAIR Right 03/28/2020   Procedure: Right Inguinal Herniorrhaphy with Mesh;  Surgeon: Franky Macho, MD;  Location: [facility];  Service: General;  Laterality: Right;   LAPAROSCOPIC PARTIAL COLECTOMY N/A 06/11/2013   Procedure: LAPAROSCOPIC HAND ASSISTED PARTIAL COLECTOMY;  Surgeon: Dalia Heading, MD;  Location: [facility];  Service: General;  Laterality: N/A;   NASAL ENDOSCOPY WITH EPISTAXIS CONTROL Bilateral 02/11/2020   Procedure: NASAL ENDOSCOPY WITH EPISTAXIS CONTROL;  Surgeon: Newman Pies, MD;  Location: [facility];  Service: ENT;  Laterality: Bilateral;   right eye detached retina Bilateral    SPINAL FUSION  2016   YAG LASER APPLICATION Left 05/06/2014   Procedure: YAG LASER APPLICATION;  Surgeon: Susa Simmonds, MD;   Location: [facility];  Service: Ophthalmology;  Laterality: Left;    Home Medications:  Allergies as of 01/28/2023       Reactions   Bactrim [sulfamethoxazole-trimethoprim] Hives   Other Anaphylaxis, Other (See Comments)   Beta Adrenergic Blockers Diarrhea, Other (See Comments)   Does not recall an allergy   Lasix [furosemide] Rash   Penicillin G    Other reaction(s): Unknown   Penicillin G Sodium    Other reaction(s): Unknown   Cefzil [cefprozil] Nausea Only   Patient does recall allergy   Ciprofloxacin Nausea And Vomiting, Rash, Other (See Comments)   Body aches   Dexamethasone Swelling   Gabapentin Swelling   Methocarbamol Swelling, Rash   Neomycin Other (See Comments)   Other reaction(s): Unknown   Penicillins Swelling, Rash   Has patient had a PCN reaction causing immediate rash, facial/tongue/throat swelling, SOB or lightheadedness with hypotension: yes Has patient had a PCN reaction causing severe rash involving mucus membranes or skin necrosis: unknown Has patient had a PCN reaction that required hospitalization: no Has patient had a PCN reaction occurring within the last 10 years: No If all of the above answers are "NO", then may proceed with Cephalosporin use.   Sulfamethoxazole Rash   Tetracyclines & Related Itching        Medication List        Accurate as of January 28, 2023 10:28 AM. If you have any questions, ask your nurse or doctor.          albuterol 108 (90 Base) MCG/ACT inhaler Commonly known as: VENTOLIN HFA Inhale 1-2 puffs into the lungs every 6 (six) hours as needed for wheezing or shortness of breath.   albuterol (2.5 MG/3ML) 0.083% nebulizer solution Commonly known as: PROVENTIL Take 3 mLs (2.5 mg total) by nebulization every 6 (six) hours as needed for wheezing or shortness of breath.   alfuzosin 10 MG 24 hr tablet Commonly known as: UROXATRAL Take 1 tablet (10 mg total) by mouth daily with breakfast.   cetirizine 10 MG  tablet Commonly known as: ZYRTEC Take 10 mg by mouth daily.   Combigan 0.2-0.5 % ophthalmic solution Generic drug: brimonidine-timolol Apply 1 drop to eye 2 (two) times daily.   desonide 0.05 % cream Commonly known as: DESOWEN Apply topically 2 (two) times daily.   esomeprazole 20 MG capsule Commonly known as: NEXIUM Take 20 mg by mouth daily.   fluticasone-salmeterol 230-21 MCG/ACT inhaler Commonly known as: Advair HFA Inhale 2 puffs into the lungs 2 (two) times daily.   hydroxypropyl methylcellulose / hypromellose 2.5 % ophthalmic solution Commonly known as: ISOPTO TEARS / GONIOVISC Place 1 drop into both eyes 3 (three) times daily as needed for dry eyes.   LORazepam 0.5  MG tablet Commonly known as: ATIVAN Take 1 tablet (0.5 mg total) by mouth at bedtime as needed for anxiety.   lovastatin 40 MG tablet Commonly known as: MEVACOR TAKE 1 TABLET BY MOUTH DAILY.   nitroGLYCERIN 0.4 MG SL tablet Commonly known as: NITROSTAT Place 1 tablet (0.4 mg total) under the tongue every 5 (five) minutes as needed for chest pain. What changed:  when to take this reasons to take this   oxymetazoline 0.05 % nasal spray Commonly known as: AFRIN Place 1 spray into both nostrils 2 (two) times daily.   polyethylene glycol 17 g packet Commonly known as: MIRALAX / GLYCOLAX Take 17 g by mouth 2 (two) times daily.   psyllium 58.6 % packet Commonly known as: METAMUCIL Take 1 packet by mouth 2 (two) times daily.   Spacer/Aero-Holding [employer] Use as directed        Allergies:  Allergies  Allergen Reactions   Bactrim [Sulfamethoxazole-Trimethoprim] Hives   Other Anaphylaxis and Other (See Comments)   Beta Adrenergic Blockers Diarrhea and Other (See Comments)    Does not recall an allergy   Lasix [Furosemide] Rash   Penicillin G     Other reaction(s): Unknown   Penicillin G Sodium     Other reaction(s): Unknown   Cefzil [Cefprozil] Nausea Only    Patient does recall  allergy   Ciprofloxacin Nausea And Vomiting, Rash and Other (See Comments)    Body aches    Dexamethasone Swelling   Gabapentin Swelling   Methocarbamol Swelling and Rash   Neomycin Other (See Comments)    Other reaction(s): Unknown   Penicillins Swelling and Rash    Has patient had a PCN reaction causing immediate rash, facial/tongue/throat swelling, SOB or lightheadedness with hypotension: yes Has patient had a PCN reaction causing severe rash involving mucus membranes or skin necrosis: unknown Has patient had a PCN reaction that required hospitalization: no Has patient had a PCN reaction occurring within the last 10 years: No If all of the above answers are "NO", then may proceed with Cephalosporin use.    Sulfamethoxazole Rash   Tetracyclines & Related Itching    Family History: Family History  Problem Relation Age of Onset   Hypertension Mother    COPD Father    Cancer Brother        brain    Social History:  reports that he quit smoking about 19 years ago. His smoking use included cigarettes. He started smoking about 54 years ago. He has a 35 pack-year smoking history. He has never used smokeless tobacco. He reports that he does not drink alcohol and does not use drugs.  ROS: All other review of systems were reviewed and are negative except what is noted above in HPI  Physical Exam: BP 134/76   Pulse 80   Constitutional:  Alert and oriented, No acute distress. HEENT: [place] AT, moist mucus membranes.  Trachea midline, no masses. Cardiovascular: No clubbing, cyanosis, or edema. Respiratory: Normal respiratory effort, no increased work of breathing. GI: Abdomen is soft, nontender, nondistended, no abdominal masses GU: No CVA tenderness.  Lymph: No cervical or inguinal lymphadenopathy. Skin: No rashes, bruises or suspicious lesions. Neurologic: Grossly intact, no focal deficits, moving all 4 extremities. Psychiatric: Normal mood and affect.  Laboratory Data: Lab  Results  Component Value Date   WBC 8.2 04/22/2022   HGB 13.5 04/22/2022   HCT 43.1 04/22/2022   MCV 87.4 04/22/2022   PLT 213 04/22/2022    Lab Results  Component Value Date   CREATININE 0.91 06/29/2022    Lab Results  Component Value Date   PSA 1.80 07/26/2014   PSA 1.42 03/26/2013    No results found for: "TESTOSTERONE"  Lab Results  Component Value Date   HGBA1C 6.1 (H) 04/23/2021    Urinalysis    Component Value Date/Time   COLORURINE STRAW (A) 12/28/2019 1541   APPEARANCEUR Clear 01/29/2022 1020   LABSPEC 1.005 12/28/2019 1541   PHURINE 8.0 12/28/2019 1541   GLUCOSEU Negative 01/29/2022 1020   HGBUR NEGATIVE 12/28/2019 1541   BILIRUBINUR Negative 01/29/2022 1020   KETONESUR NEGATIVE 12/28/2019 1541   PROTEINUR Negative 01/29/2022 1020   PROTEINUR NEGATIVE 12/28/2019 1541   UROBILINOGEN 2.0 03/05/2016 1544   UROBILINOGEN 0.2 12/04/2014 0240   NITRITE Negative 01/29/2022 1020   NITRITE NEGATIVE 12/28/2019 1541   LEUKOCYTESUR Negative 01/29/2022 1020   LEUKOCYTESUR NEGATIVE 12/28/2019 1541    Lab Results  Component Value Date   LABMICR Comment 01/29/2022   WBCUA None seen 04/30/2020   LABEPIT None seen 04/30/2020   BACTERIA None seen 04/30/2020    Pertinent Imaging:  Results for orders placed during the hospital encounter of 11/11/14  DG Abd 1 View  Narrative CLINICAL DATA:  Difficulty urinating with burning and pelvic pressure for 6 days.  EXAM: ABDOMEN - 1 VIEW  COMPARISON:  None.  FINDINGS: The bowel gas pattern is nonobstructive. Prominent stool burden in the transverse colon is noted. No unexpected abdominal calcification is seen. The patient is status post lower lumbar fusion.  IMPRESSION: No acute abnormality.   Electronically Signed By: Drusilla Kanner M.D. On: 11/11/2014 17:46  Results for orders placed in visit on 03/30/22  US Venous Img Lower Bilateral  Narrative CLINICAL DATA:  Bilateral lower extremity  edema  EXAM: BILATERAL LOWER EXTREMITY VENOUS DOPPLER ULTRASOUND  TECHNIQUE: Gray-scale sonography with graded compression, as well as color Doppler and duplex ultrasound were performed to evaluate the lower extremity deep venous systems from the level of the common femoral vein and including the common femoral, femoral, profunda femoral, popliteal and calf veins including the posterior tibial, peroneal and gastrocnemius veins when visible. The superficial great saphenous vein was also interrogated. Spectral Doppler was utilized to evaluate flow at rest and with distal augmentation maneuvers in the common femoral, femoral and popliteal veins.  COMPARISON:  None Available.  FINDINGS: RIGHT LOWER EXTREMITY  Common Femoral Vein: No evidence of thrombus. Normal compressibility, respiratory phasicity and response to augmentation.  Saphenofemoral Junction: No evidence of thrombus. Normal compressibility and flow on color Doppler imaging.  Profunda Femoral Vein: No evidence of thrombus. Normal compressibility and flow on color Doppler imaging.  Femoral Vein: No evidence of thrombus. Normal compressibility, respiratory phasicity and response to augmentation.  Popliteal Vein: No evidence of thrombus. Normal compressibility, respiratory phasicity and response to augmentation.  Calf Veins: No evidence of thrombus. Normal compressibility and flow on color Doppler imaging.  Superficial Great Saphenous Vein: No evidence of thrombus. Normal compressibility.  Venous Reflux:  None.  Other Findings:  None.  LEFT LOWER EXTREMITY  Common Femoral Vein: No evidence of thrombus. Normal compressibility, respiratory phasicity and response to augmentation.  Saphenofemoral Junction: No evidence of thrombus. Normal compressibility and flow on color Doppler imaging.  Profunda Femoral Vein: No evidence of thrombus. Normal compressibility and flow on color Doppler imaging.  Femoral Vein: No  evidence of thrombus. Normal compressibility, respiratory phasicity and response to augmentation.  Popliteal Vein: No evidence of thrombus. Normal compressibility, respiratory phasicity  and response to augmentation.  Calf Veins: No evidence of thrombus. Normal compressibility and flow on color Doppler imaging.  Superficial Great Saphenous Vein: No evidence of thrombus. Normal compressibility.  Venous Reflux:  None.  Other Findings:  None.  IMPRESSION: No evidence of deep venous thrombosis in either lower extremity.   Electronically Signed By: Malachy Moan M.D. On: 03/31/2022 09:45  No results found for this or any previous visit.  No results found for this or any previous visit.  No results found for this or any previous visit.  No valid procedures specified. No results found for this or any previous visit.  No results found for this or any previous visit.   Assessment & Plan:    1. BPH associated with nocturia Continue uroxatral 10mg  QHS - Urinalysis, Routine w reflex microscopic - BLADDER SCAN AMB NON-IMAGING  2. Nocturia Continue uroxatral 10mg  qhs   No follow-ups on file.  Wilkie Aye, MD  [facility] [place]

## 2023-01-28 NOTE — Progress Notes (Signed)
Subjective:  Patient ID: [name] [name], male    DOB: 14-Jun-1940  Age: 82 y.o. MRN: [id number]  CC: Discuss recent CT; continued abdominal pain   HPI:  82 year old male with the below mentioned medical problems presents for evaluation of the above.  Patient states that he continues to have intermittent right lower quadrant pain.  He has had an extensive evaluation for this including labs, imaging, referral to GI as well as referral to general surgery.  He is still concerned about this given that the pain occurs often.  General surgery felt like his right lower quadrant/right groin pain was muscular.  GI felt that adhesions from previous surgery with a component of constipation is the most likely culprit.  No concerning features.  Prior imaging has been essentially unremarkable.  Patient had recent CT imaging regarding his aneurysm.  We reviewed this today.  Needs follow-up with vascular surgery.  Blood pressure is at goal.   Patient Active Problem List   Diagnosis Date Noted   Personal history of colonic polyps 12/21/2022   History of acute pancreatitis 12/21/2022   Chronic idiopathic constipation 12/21/2022   Osteoarthritis of knees, bilateral 04/14/2022   Irritable bowel syndrome 03/17/2022   Recurrent right lower quadrant abdominal pain 03/03/2022   Seasonal allergies 08/06/2021   COPD (chronic obstructive pulmonary disease) (HCC) 04/23/2021   BPH (benign prostatic hyperplasia) 04/23/2021   CAD (coronary artery disease) 04/23/2021   Hypertension 09/16/2016   Lumbar spondylosis 11/05/2014   GERD (gastroesophageal reflux disease) 01/19/2012   Hyperlipidemia 04/15/2008   AAA (abdominal aortic aneurysm) (HCC) 04/15/2008    Social Hx   Social History   Socioeconomic History   Marital status: Widowed    Spouse name: Not on file   Number of children: Not on file   Years of education: Not on file   Highest education level: Not on file  Occupational History   Not on file   Tobacco Use   Smoking status: Former    Current packs/day: 0.00    Average packs/day: 1 pack/day for 35.0 years (35.0 ttl pk-yrs)    Types: Cigarettes    Start date: 03/27/1968    Quit date: 03/28/2003    Years since quitting: 19.8   Smokeless tobacco: Never  Vaping Use   Vaping status: Never Used  Substance and Sexual Activity   Alcohol use: No    Alcohol/week: 0.0 standard drinks of alcohol   Drug use: No   Sexual activity: Not Currently    Birth control/protection: None  Other Topics Concern   Not on file  Social History Narrative   Not on file   Social Determinants of Health   Financial Resource Strain: Low Risk  (03/18/2022)   Overall Financial Resource Strain (CARDIA)    Difficulty of Paying Living Expenses: Not very hard  Food Insecurity: No Food Insecurity (03/18/2022)   Hunger Vital Sign    Worried About Running Out of Food in the Last Year: Never true    Ran Out of Food in the Last Year: Never true  Transportation Needs: No Transportation Needs (03/18/2022)   PRAPARE - Administrator, Civil Service (Medical): No    Lack of Transportation (Non-Medical): No  Physical Activity: Insufficiently Active (03/18/2022)   Exercise Vital Sign    Days of Exercise per Week: 2 days    Minutes of Exercise per Session: 20 min  Stress: No Stress Concern Present (03/18/2022)   [employer] of Occupational Health - Occupational Stress Questionnaire  Feeling of Stress : Not at all  Social Connections: Socially Isolated (03/18/2022)   Social Connection and Isolation Panel [NHANES]    Frequency of Communication with Friends and Family: More than three times a week    Frequency of Social Gatherings with Friends and Family: Three times a week    Attends Religious Services: Never    Active Member of Clubs or Organizations: No    Attends Banker Meetings: Never    Marital Status: Widowed    Review of Systems Per HPI  Objective:  BP 129/76   Pulse 72    Temp 97.9 F (36.6 C)   Wt 207 lb 6.4 oz (94.1 kg)   SpO2 98%   BMI 27.36 kg/m      01/27/2023    2:44 PM 01/04/2023    8:31 AM 12/21/2022   10:29 AM  BP/Weight  Systolic BP 129 131 150  Diastolic BP 76 86 92  Wt. (Lbs) 207.4 206   BMI 27.36 kg/m2 27.18 kg/m2     Physical Exam Vitals and nursing note reviewed.  Constitutional:      General: He is not in acute distress.    Appearance: Normal appearance.  HENT:     Head: Normocephalic and atraumatic.  Cardiovascular:     Rate and Rhythm: Normal rate and regular rhythm.  Pulmonary:     Effort: Pulmonary effort is normal.     Breath sounds: No wheezing or rales.  Abdominal:     General: There is no distension.     Palpations: Abdomen is soft.     Tenderness: There is no abdominal tenderness.  Neurological:     Mental Status: He is alert.     Lab Results  Component Value Date   WBC 8.2 04/22/2022   HGB 13.5 04/22/2022   HCT 43.1 04/22/2022   PLT 213 04/22/2022   GLUCOSE 95 06/29/2022   CHOL 124 04/23/2021   TRIG 109 04/23/2021   HDL 39 (L) 04/23/2021   LDLCALC 65 04/23/2021   ALT 16 06/29/2022   AST 18 06/29/2022   NA 137 06/29/2022   K 4.6 06/29/2022   CL 99 06/29/2022   CREATININE 0.91 06/29/2022   BUN 12 06/29/2022   CO2 25 06/29/2022   TSH 1.230 02/14/2019   PSA 1.80 07/26/2014   HGBA1C 6.1 (H) 04/23/2021     Assessment & Plan:   Problem List Items Addressed This Visit       Cardiovascular and Mediastinum   Hypertension    Blood pressure well-controlled.        Other   Recurrent right lower quadrant abdominal pain - Primary    No red flags.  Advised to follow-up with GI.  Patient can consider colonoscopy although I doubt that it will be fruitful.  Supportive care.      Other Visit Diagnoses     Need for vaccination       Relevant Orders   Flu Vaccine Trivalent High Dose (Fluad) (Completed)       Follow-up:  Return in about 3 months (around 04/28/2023).  Everlene Other  DO [facility]

## 2023-01-28 NOTE — Assessment & Plan Note (Signed)
Blood pressure well controlled

## 2023-01-28 NOTE — Telephone Encounter (Signed)
Azithromycin sent into pharmacy

## 2023-01-28 NOTE — Telephone Encounter (Signed)
Atc no answer, lvmm pt if he had any more questions or concerns

## 2023-01-28 NOTE — Patient Instructions (Signed)

## 2023-02-02 ENCOUNTER — Encounter (INDEPENDENT_AMBULATORY_CARE_PROVIDER_SITE_OTHER): Payer: Self-pay | Admitting: Gastroenterology

## 2023-02-02 ENCOUNTER — Ambulatory Visit (INDEPENDENT_AMBULATORY_CARE_PROVIDER_SITE_OTHER): Payer: Medicare Other | Admitting: Gastroenterology

## 2023-02-02 VITALS — BP 119/78 | HR 74 | Temp 97.9°F | Ht 73.0 in | Wt 208.0 lb

## 2023-02-02 DIAGNOSIS — K5909 Other constipation: Secondary | ICD-10-CM

## 2023-02-02 DIAGNOSIS — Z8719 Personal history of other diseases of the digestive system: Secondary | ICD-10-CM

## 2023-02-02 DIAGNOSIS — Z86010 Personal history of colonic polyps: Secondary | ICD-10-CM | POA: Diagnosis not present

## 2023-02-02 DIAGNOSIS — R1031 Right lower quadrant pain: Secondary | ICD-10-CM

## 2023-02-02 DIAGNOSIS — K219 Gastro-esophageal reflux disease without esophagitis: Secondary | ICD-10-CM

## 2023-02-02 DIAGNOSIS — I714 Abdominal aortic aneurysm, without rupture, unspecified: Secondary | ICD-10-CM

## 2023-02-02 DIAGNOSIS — I771 Stricture of artery: Secondary | ICD-10-CM | POA: Insufficient documentation

## 2023-02-02 DIAGNOSIS — G8929 Other chronic pain: Secondary | ICD-10-CM | POA: Insufficient documentation

## 2023-02-02 DIAGNOSIS — K5904 Chronic idiopathic constipation: Secondary | ICD-10-CM

## 2023-02-02 NOTE — Patient Instructions (Signed)
It was very nice to meet you today, as dicussed with will plan for the following :  1) Ensure adequate fluid intake: Aim for 8 glasses of water daily. Follow a high fiber diet: Include foods such as dates, prunes, pears, and kiwi. Take Miralax twice a day for the first week, then reduce to once daily thereafter. Use Metamucil twice a day.  2) Follow up with vascular surgeon

## 2023-02-02 NOTE — Progress Notes (Signed)
[name] , M.D. Gastroenterology & Hepatology [facility] [address] [place], [place] [address] Primary Care Physician: Tommie Sams, DO [address] [place] [address]  Chief Complaint: Abdominal pain  History of Present Illness: [name] [name] is a 82 y.o. male AAA, coronary artery disease , hyperlipidemia ,GERD, hypertension presents for evaluation of abdominal pain  02/02/2023  Patient reports that after taking MiraLAX and Metamucil he is having more regular bowel movements daily before and his abdominal pain is much better.  Although he still feels abdominal pain located in the right lower quadrant worsens with movement such as bending and leaning forward.  Sometimes also the pain is postprandial but is relieved with a good bowel movement -------------------------------- History  Patient reports that for the past few years he has recurrent right lower quadrant abdominal pain   Reports associated right lower quadrant nonradiating worsens with constipation and sometimes worsens with movement.  Denies any blood in the stool.  Patient reports Bristol stool scale 1-2 every day which is followed liquid stools.  Patient reports having right lower quadrant and inguinal hernia repair and right lower quadrant bowel resection many years ago. The patient denies having any nausea, vomiting, fever, chills, hematochezia, melena, hematemesis, , diarrhea, jaundice, pruritus or weight loss.Patient at one time was diagnosed with acute pancreatitis even though he does not drink alcohol  Last EGD: none  Last Colonoscopy:2016  Report not available   FHx: neg for any gastrointestinal/liver disease, no malignancies Social: neg smoking, alcohol or illicit drug use Surgical: Right bowel resection and inguinal hernia repair  Past Medical History: Past Medical History:  Diagnosis Date   AAA (abdominal aortic aneurysm) (HCC)    needs yearly  ultrasound   Allergy    Anemia    Arthritis    Asthma    BCC (basal cell carcinoma) 08/18/1989   left shoulder blad, upper right arm, left arm beyond elbow, c&d   BCC (basal cell carcinoma) 01/31/1992   Posterior neck, curetx3, 21fu   BCC (basal cell carcinoma) 11/22/2001   mid forehead, cx3, excision, right forearm, cx3, 18fu   BCC (basal cell carcinoma) 10/09/2003   mid forehead, MOHs   BCC (basal cell carcinoma) 08/15/2008   upper left back, biopsy   BPH (benign prostatic hyperplasia)    CAD (coronary artery disease)    Cancer (HCC)    skin cancer   COPD (chronic obstructive pulmonary disease) (HCC)    Dysrhythmia    pt. states it can be fast at times   GERD (gastroesophageal reflux disease)    Glaucoma    HOH (hard of hearing)    Hypercholesterolemia    Hypertension    Impaired fasting glucose    Low back pain    Melanoma in situ (HCC) 10/09/2003   left chin, MOHs   MI (myocardial infarction) (HCC) 1999   SCC (squamous cell carcinoma) 07/03/2014   in situ, behind left ear, cx3, cautery, 13fu   SCC (squamous cell carcinoma) 07/03/2014   well diff, left forearm, biopsy, cx1, cautery   SCC (squamous cell carcinoma) 07/20/2017   in situ, left upper arm, cx3, 39fu   SCC (squamous cell carcinoma) 01/10/2019   in situ, left post shoulder, cx3, 3fu   SCC (squamous cell carcinoma) 11/22/2001   left forearm distal, left forearm, cx3, 64fu   SCC (squamous cell carcinoma) 10/09/2003   Bowens, left ear post, clear per st, right cheek clear   SCC (  squamous cell carcinoma) 03/30/2004   in situ, left upper arm, cx3, 54fu   SCC (squamous cell carcinoma) 03/08/2005   in situ, right cheek, mid upper forehead, cx3, 52fu   SCC (squamous cell carcinoma) 06/08/2006   in situ, left shoulder, cx3, 24fu   SCC (squamous cell carcinoma) 05/05/2010   right inner wrist, biopsy   SCC (squamous cell carcinoma) 09/13/2013   in situ, right crown scalp, front scalp, biopsy   Thrush     Past  Surgical History: Past Surgical History:  Procedure Laterality Date   BACK SURGERY     x 3   CARDIAC CATHETERIZATION     angioplasty   CATARACT EXTRACTION W/PHACO  03/20/2012   Procedure: CATARACT EXTRACTION PHACO AND INTRAOCULAR LENS PLACEMENT (IOC);  Surgeon: Susa Simmonds, MD;  Location: [facility];  Service: Ophthalmology;  Laterality: Right;  CDE:  8.45   CATARACT EXTRACTION W/PHACO Left 04/02/2013   Procedure: CATARACT EXTRACTION PHACO AND INTRAOCULAR LENS PLACEMENT (IOC);  Surgeon: Susa Simmonds, MD;  Location: [facility];  Service: Ophthalmology;  Laterality: Left;  CDE:  6.50   CHOLECYSTECTOMY  2000   COLONOSCOPY  2009   repeat 5 years   ESOPHAGOGASTRODUODENOSCOPY     HERNIA REPAIR Left    inguinal   INGUINAL HERNIA REPAIR Right 03/28/2020   Procedure: Right Inguinal Herniorrhaphy with Mesh;  Surgeon: Franky Macho, MD;  Location: [facility];  Service: General;  Laterality: Right;   LAPAROSCOPIC PARTIAL COLECTOMY N/A 06/11/2013   Procedure: LAPAROSCOPIC HAND ASSISTED PARTIAL COLECTOMY;  Surgeon: Dalia Heading, MD;  Location: [facility];  Service: General;  Laterality: N/A;   NASAL ENDOSCOPY WITH EPISTAXIS CONTROL Bilateral 02/11/2020   Procedure: NASAL ENDOSCOPY WITH EPISTAXIS CONTROL;  Surgeon: Newman Pies, MD;  Location: [facility];  Service: ENT;  Laterality: Bilateral;   right eye detached retina Bilateral    SPINAL FUSION  2016   YAG LASER APPLICATION Left 05/06/2014   Procedure: YAG LASER APPLICATION;  Surgeon: Susa Simmonds, MD;  Location: [facility];  Service: Ophthalmology;  Laterality: Left;    Family History: Family History  Problem Relation Age of Onset   Hypertension Mother    COPD Father    Cancer Brother        brain    Social History: Social History   Tobacco Use  Smoking Status Former   Current packs/day: 0.00   Average packs/day: 1 pack/day for 35.0 years (35.0 ttl pk-yrs)   Types: Cigarettes   Start date: 03/27/1968   Quit date: 03/28/2003    Years since quitting: 19.8  Smokeless Tobacco Never   Social History   Substance and Sexual Activity  Alcohol Use No   Alcohol/week: 0.0 standard drinks of alcohol   Social History   Substance and Sexual Activity  Drug Use No    Allergies: Allergies  Allergen Reactions   Bactrim [Sulfamethoxazole-Trimethoprim] Hives   Other Anaphylaxis and Other (See Comments)   Beta Adrenergic Blockers Diarrhea and Other (See Comments)    Does not recall an allergy   Lasix [Furosemide] Rash   Penicillin G     Other reaction(s): Unknown   Penicillin G Sodium     Other reaction(s): Unknown   Cefzil [Cefprozil] Nausea Only    Patient does recall allergy   Ciprofloxacin Nausea And Vomiting, Rash and Other (See Comments)    Body aches    Dexamethasone Swelling   Gabapentin Swelling   Methocarbamol Swelling and Rash   Neomycin Other (See  Comments)    Other reaction(s): Unknown   Penicillins Swelling and Rash    Has patient had a PCN reaction causing immediate rash, facial/tongue/throat swelling, SOB or lightheadedness with hypotension: yes Has patient had a PCN reaction causing severe rash involving mucus membranes or skin necrosis: unknown Has patient had a PCN reaction that required hospitalization: no Has patient had a PCN reaction occurring within the last 10 years: No If all of the above answers are "NO", then may proceed with Cephalosporin use.    Sulfamethoxazole Rash   Tetracyclines & Related Itching    Medications: Current Outpatient Medications  Medication Sig Dispense Refill   albuterol (PROVENTIL) (2.5 MG/3ML) 0.083% nebulizer solution Take 3 mLs (2.5 mg total) by nebulization every 6 (six) hours as needed for wheezing or shortness of breath. 360 mL 5   albuterol (VENTOLIN HFA) 108 (90 Base) MCG/ACT inhaler Inhale 1-2 puffs into the lungs every 6 (six) hours as needed for wheezing or shortness of breath. 18 g 5   alfuzosin (UROXATRAL) 10 MG 24 hr tablet Take 1 tablet (10 mg  total) by mouth daily with breakfast. 90 tablet 3   COMBIGAN 0.2-0.5 % ophthalmic solution Apply 1 drop to eye 2 (two) times daily.     desonide (DESOWEN) 0.05 % cream Apply topically 2 (two) times daily.     esomeprazole (NEXIUM) 20 MG capsule Take 20 mg by mouth daily.     fluticasone-salmeterol (ADVAIR HFA) 230-21 MCG/ACT inhaler Inhale 2 puffs into the lungs 2 (two) times daily. 12 g 5   hydroxypropyl methylcellulose / hypromellose (ISOPTO TEARS / GONIOVISC) 2.5 % ophthalmic solution Place 1 drop into both eyes 3 (three) times daily as needed for dry eyes.     loratadine (CLARITIN) 10 MG tablet Take 10 mg by mouth daily.     LORazepam (ATIVAN) 0.5 MG tablet Take 1 tablet (0.5 mg total) by mouth at bedtime as needed for anxiety. 30 tablet 3   lovastatin (MEVACOR) 40 MG tablet TAKE 1 TABLET BY MOUTH DAILY. 90 tablet 0   nitroGLYCERIN (NITROSTAT) 0.4 MG SL tablet Place 1 tablet (0.4 mg total) under the tongue every 5 (five) minutes as needed for chest pain. (Patient taking differently: Place 0.4 mg under the tongue every 5 (five) minutes x 3 doses as needed for chest pain (if no relief after 3rd dose, proceed to ED or call 911).) 25 tablet 5   oxymetazoline (AFRIN) 0.05 % nasal spray Place 1 spray into both nostrils 2 (two) times daily.     polyethylene glycol (MIRALAX / GLYCOLAX) 17 g packet Take 17 g by mouth 2 (two) times daily. 180 packet 0   psyllium (METAMUCIL) 58.6 % packet Take 1 packet by mouth 2 (two) times daily. 60 packet 2   Spacer/Aero-Holding Rudean Curt Use as directed 1 each 0   No current facility-administered medications for this visit.    Review of Systems: GENERAL: negative for malaise, night sweats HEENT: No changes in hearing or vision, no nose bleeds or other nasal problems. NECK: Negative for lumps, goiter, pain and significant neck swelling RESPIRATORY: Negative for cough, wheezing CARDIOVASCULAR: Negative for chest pain, leg swelling, palpitations, orthopnea GI:  SEE HPI MUSCULOSKELETAL: Negative for joint pain or swelling, back pain, and muscle pain. SKIN: Negative for lesions, rash HEMATOLOGY Negative for prolonged bleeding, bruising easily, and swollen nodes. ENDOCRINE: Negative for cold or heat intolerance, polyuria, polydipsia and goiter. NEURO: negative for tremor, gait imbalance, syncope and seizures. The remainder of the review  of systems is noncontributory.   Physical Exam: BP 119/78 (BP Location: Left Arm, Patient Position: Sitting, Cuff Size: Large)   Pulse 74   Temp 97.9 F (36.6 C) (Oral)   Ht 6\' 1"  (1.854 m)   Wt 208 lb (94.3 kg)   BMI 27.44 kg/m  GENERAL: The patient is AO x3, in no acute distress. HEENT: Head is normocephalic and atraumatic. EOMI are intact. Mouth is well hydrated and without lesions. NECK: Supple. No masses LUNGS: Clear to auscultation. No presence of rhonchi/wheezing/rales. Adequate chest expansion HEART: RRR, normal s1 and s2. ABDOMEN: Soft, nontender, no guarding, no peritoneal signs, and nondistended. BS +. No masses. EXTREMITIES: Without any cyanosis, clubbing, rash, lesions or edema. NEUROLOGIC: AOx3, no focal motor deficit. SKIN: no jaundice, no rashes   Imaging/Labs: as above  I personally reviewed and interpreted the available labs, imaging and endoscopic files.  CT abdomen and Pelvis 12/28/2022  Infrarenal abdominal aortic aneurysm with the greatest diameter on the parasagittal images estimated 5.2 cm. Circumferential plaque/thrombus at the level of the aneurysm sac.   Aortic atherosclerosis with associated mild/moderate bilateral CIA disease. Aortic Atherosclerosis (ICD10-I70.0).   Mesenteric arterial disease, including compression of the celiac artery from the diaphragm crus, mild SMA disease, and occluded IMA.   Mild bilateral renal arterial disease.  February 2024 no other enzymes   Impression and Plan: [name] [name] is a 82 y.o. male AAA, coronary artery disease ,  hyperlipidemia ,GERD, hypertension presents for evaluation of abdominal pain  #Right lower quadrant abdominal pain  #Constipation   Patient has chronic right lower quadrant abdominal pain.  This likely  be from adhesions from previous surgery such as inguinal hernia repair and bowel resection in 2015  Pain increases with movement ( positional)  and constipation.  There could be a component of constipation leading to abdominal pain and bit better control of constipation pain has improved  Also given recent CT scan demonstrating celiac artery, SMA and occluded IMA this is also contributed by vascular compromise as pain is also postprandial  In conclusion patient abdominal pain is chronic and is likely due to its bowel adhesions with chronic constipation in setting of significant vascular disease  patient reports last colonoscopy 2015 had a polyp that she had surgically removed, no colonoscopy performed after 2016  Recs: Adequate fluid intake , 8 glass of water daily High fiber diet : Dates, prunes , pears, Kiwi  Miralax BID followed by Daily  Metamucil BID  -Discussed the risk benefit and indication of colonoscopy and given patient comorbidities (abdominal aortic aneurysm and coronary artery disease) and advanced age would like to hold onto endoscopy evaluation at this time.  Patient verbalizes understanding  -If patient develops any red flags such as anemia or hematochezia will consider colonoscopy at that time  -Recommend patient continue to follow with vascular surgeon for significant vascular disease  #History of Acute pancreatitis Patient does not have history of gallstones and does not drink alcohol/pain diagnosed with acute pancreatitis in 2020  He is getting every 6 month CT abdomen which will be adequate to evaluate pancreas and should get serial hemoglobin A1c  All questions were answered.      [name], MD Gastroenterology and Hepatology [facility]   This chart has been completed using [name] Dictation software, and while attempts have been made to ensure accuracy , certain words and phrases may not be transcribed as intended

## 2023-02-21 ENCOUNTER — Ambulatory Visit (INDEPENDENT_AMBULATORY_CARE_PROVIDER_SITE_OTHER): Payer: Medicare Other | Admitting: Family Medicine

## 2023-02-21 DIAGNOSIS — J441 Chronic obstructive pulmonary disease with (acute) exacerbation: Secondary | ICD-10-CM

## 2023-02-21 MED ORDER — AZITHROMYCIN 250 MG PO TABS: ORAL_TABLET | ORAL | 0 refills | Status: AC

## 2023-02-21 MED ORDER — NYSTATIN 100000 UNIT/ML MT SUSP: 5.0000 mL | Freq: Four times a day (QID) | OROMUCOSAL | 0 refills | Status: DC | PRN

## 2023-02-21 MED ORDER — PREDNISONE 50 MG PO TABS: 50.0000 mg | ORAL_TABLET | Freq: Every day | ORAL | 0 refills | Status: AC

## 2023-02-21 NOTE — Progress Notes (Signed)
Subjective:  Patient ID: [name] [name], male    DOB: 1940/12/15  Age: 82 y.o. MRN: [id number]  CC: Respiratory symptoms  HPI:  82 year old with an extensive past medical history including COPD presents with respiratory symptoms.  Patient reports he has had worsening symptoms over the past 4 days.  He reports productive cough and congestion.  He also reports anterior neck pain and sore throat.  Reports that he feels like his mouth and throat are dry.  He has had some recent bumps on the tongue which she has been treating at home.  No fever.  Had recent negative home COVID testing.  Patient Active Problem List   Diagnosis Date Noted   Celiac artery stenosis (HCC) 02/02/2023   History of colonic polyps 12/21/2022   History of acute pancreatitis 12/21/2022   Chronic idiopathic constipation 12/21/2022   Osteoarthritis of knees, bilateral 04/14/2022   Irritable bowel syndrome 03/17/2022   Recurrent right lower quadrant abdominal pain 03/03/2022   Seasonal allergies 08/06/2021   COPD (chronic obstructive pulmonary disease) (HCC) 04/23/2021   BPH (benign prostatic hyperplasia) 04/23/2021   CAD (coronary artery disease) 04/23/2021   Hypertension 09/16/2016   Lumbar spondylosis 11/05/2014   GERD (gastroesophageal reflux disease) 01/19/2012   Hyperlipidemia 04/15/2008   AAA (abdominal aortic aneurysm) (HCC) 04/15/2008    Social Hx   Social History   Socioeconomic History   Marital status: Widowed    Spouse name: Not on file   Number of children: Not on file   Years of education: Not on file   Highest education level: Not on file  Occupational History   Not on file  Tobacco Use   Smoking status: Former    Current packs/day: 0.00    Average packs/day: 1 pack/day for 35.0 years (35.0 ttl pk-yrs)    Types: Cigarettes    Start date: 03/27/1968    Quit date: 03/28/2003    Years since quitting: 19.9   Smokeless tobacco: Never  Vaping Use   Vaping status: Never Used  Substance  and Sexual Activity   Alcohol use: No    Alcohol/week: 0.0 standard drinks of alcohol   Drug use: No   Sexual activity: Not Currently    Birth control/protection: None  Other Topics Concern   Not on file  Social History Narrative   Not on file   Social Determinants of Health   Financial Resource Strain: Low Risk  (03/18/2022)   Overall Financial Resource Strain (CARDIA)    Difficulty of Paying Living Expenses: Not very hard  Food Insecurity: No Food Insecurity (03/18/2022)   Hunger Vital Sign    Worried About Running Out of Food in the Last Year: Never true    Ran Out of Food in the Last Year: Never true  Transportation Needs: No Transportation Needs (03/18/2022)   PRAPARE - Administrator, Civil Service (Medical): No    Lack of Transportation (Non-Medical): No  Physical Activity: Insufficiently Active (03/18/2022)   Exercise Vital Sign    Days of Exercise per Week: 2 days    Minutes of Exercise per Session: 20 min  Stress: No Stress Concern Present (03/18/2022)   [employer] of Occupational Health - Occupational Stress Questionnaire    Feeling of Stress : Not at all  Social Connections: Socially Isolated (03/18/2022)   Social Connection and Isolation Panel [NHANES]    Frequency of Communication with Friends and Family: More than three times a week    Frequency of Social Gatherings with  Friends and Family: Three times a week    Attends Religious Services: Never    Active Member of Clubs or Organizations: No    Attends Banker Meetings: Never    Marital Status: Widowed    Review of Systems Per HPI  Objective:  There were no vitals taken for this visit.     02/02/2023    9:06 AM 01/28/2023   10:14 AM 01/27/2023    2:44 PM  BP/Weight  Systolic BP 119 134 129  Diastolic BP 78 76 76  Wt. (Lbs) 208  207.4  BMI 27.44 kg/m2  27.36 kg/m2    Physical Exam Vitals and nursing note reviewed.  Constitutional:      General: He is not in acute  distress.    Appearance: Normal appearance.  HENT:     Head: Normocephalic and atraumatic.  Cardiovascular:     Rate and Rhythm: Normal rate and regular rhythm.  Pulmonary:     Effort: Pulmonary effort is normal.     Breath sounds: Normal breath sounds. No wheezing, rhonchi or rales.  Neurological:     Mental Status: He is alert.  Psychiatric:        Mood and Affect: Mood normal.        Behavior: Behavior normal.     Lab Results  Component Value Date   WBC 8.2 04/22/2022   HGB 13.5 04/22/2022   HCT 43.1 04/22/2022   PLT 213 04/22/2022   GLUCOSE 95 06/29/2022   CHOL 124 04/23/2021   TRIG 109 04/23/2021   HDL 39 (L) 04/23/2021   LDLCALC 65 04/23/2021   ALT 16 06/29/2022   AST 18 06/29/2022   NA 137 06/29/2022   K 4.6 06/29/2022   CL 99 06/29/2022   CREATININE 0.91 06/29/2022   BUN 12 06/29/2022   CO2 25 06/29/2022   TSH 1.230 02/14/2019   PSA 1.80 07/26/2014   HGBA1C 6.1 (H) 04/23/2021     Assessment & Plan:   COPD exacerbation: Treating with Prednisone and Azithromycin. Magic mouthwash if needed for thrush.  Meds ordered this encounter  Medications   azithromycin (ZITHROMAX) 250 MG tablet    Sig: Take 2 tablets on day 1, then 1 tablet daily on days 2 through 5    Dispense:  6 tablet    Refill:  0   predniSONE (DELTASONE) 50 MG tablet    Sig: Take 1 tablet (50 mg total) by mouth daily for 5 days.    Dispense:  5 tablet    Refill:  0    Follow-up:  Return if symptoms worsen or fail to improve.  Everlene Other DO [facility]

## 2023-02-24 DIAGNOSIS — L820 Inflamed seborrheic keratosis: Secondary | ICD-10-CM | POA: Diagnosis not present

## 2023-02-24 DIAGNOSIS — L570 Actinic keratosis: Secondary | ICD-10-CM | POA: Diagnosis not present

## 2023-02-24 DIAGNOSIS — Z1283 Encounter for screening for malignant neoplasm of skin: Secondary | ICD-10-CM | POA: Diagnosis not present

## 2023-02-24 DIAGNOSIS — D225 Melanocytic nevi of trunk: Secondary | ICD-10-CM | POA: Diagnosis not present

## 2023-02-24 DIAGNOSIS — Z85820 Personal history of malignant melanoma of skin: Secondary | ICD-10-CM | POA: Diagnosis not present

## 2023-02-24 DIAGNOSIS — Z08 Encounter for follow-up examination after completed treatment for malignant neoplasm: Secondary | ICD-10-CM | POA: Diagnosis not present

## 2023-02-24 DIAGNOSIS — X32XXXD Exposure to sunlight, subsequent encounter: Secondary | ICD-10-CM | POA: Diagnosis not present

## 2023-03-02 ENCOUNTER — Ambulatory Visit: Payer: Medicare Other | Admitting: Family Medicine

## 2023-03-02 VITALS — BP 116/74 | HR 79 | Temp 98.1°F | Wt 205.4 lb

## 2023-03-02 DIAGNOSIS — R0981 Nasal congestion: Secondary | ICD-10-CM | POA: Diagnosis not present

## 2023-03-02 DIAGNOSIS — R300 Dysuria: Secondary | ICD-10-CM | POA: Diagnosis not present

## 2023-03-02 LAB — POCT URINALYSIS DIP (CLINITEK)
Glucose, UA: NEGATIVE mg/dL
Ketones, POC UA: NEGATIVE mg/dL
Leukocytes, UA: NEGATIVE
Nitrite, UA: NEGATIVE
POC PROTEIN,UA: NEGATIVE
Spec Grav, UA: 1.015 (ref 1.010–1.025)
Urobilinogen, UA: 1 U/dL
pH, UA: 7.5 (ref 5.0–8.0)

## 2023-03-02 MED ORDER — IPRATROPIUM BROMIDE 0.06 % NA SOLN: 2.0000 | Freq: Four times a day (QID) | NASAL | 0 refills | Status: DC | PRN

## 2023-03-02 NOTE — Assessment & Plan Note (Signed)
No evidence of bacterial infection. Atrovent as prescribed.

## 2023-03-02 NOTE — Assessment & Plan Note (Signed)
UA without evidence of infection. Sending culture. Advised to increase fluid intake; urine concentrated here today.

## 2023-03-02 NOTE — Patient Instructions (Signed)
Increase water intake.  Sending culture.  Medication as directed.  If you continue to have respiratory issues, recommend seeing Pulmonologist.

## 2023-03-02 NOTE — Progress Notes (Signed)
Subjective:  Patient ID: [name] [name], male    DOB: [birth year]  Age: 82 y.o. MRN: [id number]  CC:  Concern for UTI  HPI:  82 year old male presents for evaluation of the above.   4-5 days of symptoms. Reports dysuria and nocturia.  Has had some incontinence as well. Urine is dark and has a strong odor. No abdominal pain. No fever.  Patient also reports continued respiratory symptoms - nasal congestion, dry nasal passages. Recently treated with Azithromycin. Has a pulmonologist.  Patient Active Problem List   Diagnosis Date Noted   Dysuria 03/02/2023   Nasal congestion 03/02/2023   Celiac artery stenosis (HCC) 02/02/2023   History of colonic polyps 12/21/2022   History of acute pancreatitis 12/21/2022   Chronic idiopathic constipation 12/21/2022   Osteoarthritis of knees, bilateral 04/14/2022   Irritable bowel syndrome 03/17/2022   Recurrent right lower quadrant abdominal pain 03/03/2022   Seasonal allergies 08/06/2021   COPD (chronic obstructive pulmonary disease) (HCC) 04/23/2021   BPH (benign prostatic hyperplasia) 04/23/2021   CAD (coronary artery disease) 04/23/2021   Hypertension 09/16/2016   Lumbar spondylosis 11/05/2014   GERD (gastroesophageal reflux disease) 01/19/2012   Hyperlipidemia 04/15/2008   AAA (abdominal aortic aneurysm) (HCC) 04/15/2008    Social Hx   Social History   Socioeconomic History   Marital status: Widowed    Spouse name: Not on file   Number of children: Not on file   Years of education: Not on file   Highest education level: Not on file  Occupational History   Not on file  Tobacco Use   Smoking status: Former    Current packs/day: 0.00    Average packs/day: 1 pack/day for 35.0 years (35.0 ttl pk-yrs)    Types: Cigarettes    Start date: 03/27/1968    Quit date: 03/28/2003    Years since quitting: 19.9   Smokeless tobacco: Never  Vaping Use   Vaping status: Never Used  Substance and Sexual Activity   Alcohol use: No     Alcohol/week: 0.0 standard drinks of alcohol   Drug use: No   Sexual activity: Not Currently    Birth control/protection: None  Other Topics Concern   Not on file  Social History Narrative   Not on file   Social Determinants of Health   Financial Resource Strain: Low Risk  (03/18/2022)   Overall Financial Resource Strain (CARDIA)    Difficulty of Paying Living Expenses: Not very hard  Food Insecurity: No Food Insecurity (03/18/2022)   Hunger Vital Sign    Worried About Running Out of Food in the Last Year: Never true    Ran Out of Food in the Last Year: Never true  Transportation Needs: No Transportation Needs (03/18/2022)   PRAPARE - Administrator, Civil Service (Medical): No    Lack of Transportation (Non-Medical): No  Physical Activity: Insufficiently Active (03/18/2022)   Exercise Vital Sign    Days of Exercise per Week: 2 days    Minutes of Exercise per Session: 20 min  Stress: No Stress Concern Present (03/18/2022)   [employer] of Occupational Health - Occupational Stress Questionnaire    Feeling of Stress : Not at all  Social Connections: Socially Isolated (03/18/2022)   Social Connection and Isolation Panel [NHANES]    Frequency of Communication with Friends and Family: More than three times a week    Frequency of Social Gatherings with Friends and Family: Three times a week    Attends  Religious Services: Never    Active Member of Clubs or Organizations: No    Attends Banker Meetings: Never    Marital Status: Widowed    Review of Systems Per HPI  Objective:  BP 116/74   Pulse 79   Temp 98.1 F (36.7 C) (Oral)   Wt 205 lb 6.4 oz (93.2 kg)   SpO2 97%   BMI 27.10 kg/m      03/02/2023   10:11 AM 02/02/2023    9:06 AM 01/28/2023   10:14 AM  BP/Weight  Systolic BP 116 119 134  Diastolic BP 74 78 76  Wt. (Lbs) 205.4 208   BMI 27.1 kg/m2 27.44 kg/m2     Physical Exam Constitutional:      General: He is not in acute distress.     Appearance: Normal appearance.  HENT:     Head: Normocephalic and atraumatic.     Nose: No rhinorrhea.     Mouth/Throat:     Pharynx: Oropharynx is clear.  Cardiovascular:     Rate and Rhythm: Normal rate and regular rhythm.  Pulmonary:     Effort: Pulmonary effort is normal.     Breath sounds: Normal breath sounds. No wheezing or rales.  Neurological:     Mental Status: He is alert.     Lab Results  Component Value Date   WBC 8.2 04/22/2022   HGB 13.5 04/22/2022   HCT 43.1 04/22/2022   PLT 213 04/22/2022   GLUCOSE 95 06/29/2022   CHOL 124 04/23/2021   TRIG 109 04/23/2021   HDL 39 (L) 04/23/2021   LDLCALC 65 04/23/2021   ALT 16 06/29/2022   AST 18 06/29/2022   NA 137 06/29/2022   K 4.6 06/29/2022   CL 99 06/29/2022   CREATININE 0.91 06/29/2022   BUN 12 06/29/2022   CO2 25 06/29/2022   TSH 1.230 02/14/2019   PSA 1.80 07/26/2014   HGBA1C 6.1 (H) 04/23/2021     Assessment & Plan:   Problem List Items Addressed This Visit       Other   Dysuria - Primary    UA without evidence of infection. Sending culture. Advised to increase fluid intake; urine concentrated here today.      Relevant Orders   POCT URINALYSIS DIP (CLINITEK) (Completed)   Urine Culture   Nasal congestion    No evidence of bacterial infection. Atrovent as prescribed.        Meds ordered this encounter  Medications   ipratropium (ATROVENT) 0.06 % nasal spray    Sig: Place 2 sprays into both nostrils 4 (four) times daily as needed for rhinitis.    Dispense:  15 mL    Refill:  0    Follow-up:  Return if symptoms worsen or fail to improve.  Everlene Other DO [facility]

## 2023-03-05 ENCOUNTER — Other Ambulatory Visit: Payer: Self-pay | Admitting: Family Medicine

## 2023-03-05 LAB — URINE CULTURE

## 2023-03-05 LAB — SPECIMEN STATUS REPORT

## 2023-03-05 MED ORDER — NITROFURANTOIN MONOHYD MACRO 100 MG PO CAPS: 100.0000 mg | ORAL_CAPSULE | Freq: Two times a day (BID) | ORAL | 0 refills | Status: DC

## 2023-03-16 DIAGNOSIS — H04123 Dry eye syndrome of bilateral lacrimal glands: Secondary | ICD-10-CM | POA: Diagnosis not present

## 2023-03-16 DIAGNOSIS — H401122 Primary open-angle glaucoma, left eye, moderate stage: Secondary | ICD-10-CM | POA: Diagnosis not present

## 2023-03-16 DIAGNOSIS — Z961 Presence of intraocular lens: Secondary | ICD-10-CM | POA: Diagnosis not present

## 2023-03-19 ENCOUNTER — Emergency Department (HOSPITAL_COMMUNITY): Payer: Medicare Other

## 2023-03-19 ENCOUNTER — Encounter (HOSPITAL_COMMUNITY): Payer: Self-pay

## 2023-03-19 ENCOUNTER — Other Ambulatory Visit: Payer: Self-pay

## 2023-03-19 ENCOUNTER — Emergency Department (HOSPITAL_COMMUNITY)
Admission: EM | Admit: 2023-03-19 | Discharge: 2023-03-20 | Disposition: A | Discharge status: Home / Self Care | Payer: Medicare Other

## 2023-03-19 DIAGNOSIS — I2510 Atherosclerotic heart disease of native coronary artery without angina pectoris: Secondary | ICD-10-CM | POA: Diagnosis not present

## 2023-03-19 DIAGNOSIS — Z20822 Contact with and (suspected) exposure to covid-19: Secondary | ICD-10-CM | POA: Insufficient documentation

## 2023-03-19 DIAGNOSIS — R0600 Dyspnea, unspecified: Secondary | ICD-10-CM | POA: Diagnosis not present

## 2023-03-19 DIAGNOSIS — J441 Chronic obstructive pulmonary disease with (acute) exacerbation: Secondary | ICD-10-CM | POA: Diagnosis not present

## 2023-03-19 DIAGNOSIS — Z7951 Long term (current) use of inhaled steroids: Secondary | ICD-10-CM | POA: Insufficient documentation

## 2023-03-19 DIAGNOSIS — R0602 Shortness of breath: Secondary | ICD-10-CM | POA: Diagnosis present

## 2023-03-19 LAB — BASIC METABOLIC PANEL
Anion gap: 7 (ref 5–15)
BUN: 11 mg/dL (ref 8–23)
CO2: 29 mmol/L (ref 22–32)
Calcium: 8.8 mg/dL — ABNORMAL LOW (ref 8.9–10.3)
Chloride: 100 mmol/L (ref 98–111)
Creatinine, Ser: 0.92 mg/dL (ref 0.61–1.24)
GFR, Estimated: >60 mL/min (ref >60)
Glucose, Bld: 145 mg/dL — ABNORMAL HIGH (ref 70–99)
Potassium: 3.7 mmol/L (ref 3.5–5.1)
Sodium: 136 mmol/L (ref 135–145)

## 2023-03-19 LAB — RESP PANEL BY RT-PCR (RSV, FLU A&B, COVID)  RVPGX2
Influenza A by PCR: NEGATIVE
Influenza B by PCR: NEGATIVE
Resp Syncytial Virus by PCR: NEGATIVE
SARS Coronavirus 2 by RT PCR: NEGATIVE

## 2023-03-19 LAB — CBC
HCT: 41.4 % (ref 39.0–52.0)
Hemoglobin: 13.5 g/dL (ref 13.0–17.0)
MCH: 28.7 pg (ref 26.0–34.0)
MCHC: 32.6 g/dL (ref 30.0–36.0)
MCV: 88.1 fL (ref 80.0–100.0)
Platelets: 244 10*3/uL (ref 150–400)
RBC: 4.7 MIL/uL (ref 4.22–5.81)
RDW: 14 % (ref 11.5–15.5)
WBC: 9 10*3/uL (ref 4.0–10.5)
nRBC: 0 % (ref 0.0–0.2)

## 2023-03-19 LAB — TROPONIN I (HIGH SENSITIVITY)
Troponin I (High Sensitivity): 6 ng/L (ref <18)
Troponin I (High Sensitivity): 6 ng/L (ref <18)

## 2023-03-19 MED ORDER — PREDNISONE 50 MG PO TABS
60.0000 mg | ORAL_TABLET | Freq: Once | ORAL | Status: AC
  Administered 2023-03-19: 60 mg via ORAL
  Filled 2023-03-19: qty 1

## 2023-03-19 MED ORDER — IPRATROPIUM-ALBUTEROL 0.5-2.5 (3) MG/3ML IN SOLN
3.0000 mL | Freq: Once | RESPIRATORY_TRACT | Status: AC
  Administered 2023-03-19: 3 mL via RESPIRATORY_TRACT
  Filled 2023-03-19: qty 3

## 2023-03-19 MED ORDER — ALBUTEROL SULFATE HFA 108 (90 BASE) MCG/ACT IN AERS: 2.0000 | INHALATION_SPRAY | RESPIRATORY_TRACT | 0 refills | Status: DC | PRN

## 2023-03-19 MED ORDER — PREDNISONE 50 MG PO TABS: ORAL_TABLET | ORAL | 0 refills | Status: DC

## 2023-03-19 MED ORDER — AZITHROMYCIN 250 MG PO TABS: 250.0000 mg | ORAL_TABLET | Freq: Every day | ORAL | 0 refills | Status: DC

## 2023-03-19 MED ORDER — AZITHROMYCIN 250 MG PO TABS
500.0000 mg | ORAL_TABLET | Freq: Once | ORAL | Status: AC
  Administered 2023-03-20: 500 mg via ORAL
  Filled 2023-03-19: qty 2

## 2023-03-19 NOTE — ED Triage Notes (Signed)
Pt reports he has been feeling short of breath and has had some fluttering in his chest and just overall feels very tired.

## 2023-03-19 NOTE — ED Provider Notes (Signed)
[place] EMERGENCY DEPARTMENT AT [facility] Provider Note   CSN: [id number] Arrival date & time: 03/19/23  2017     History  Chief Complaint  Patient presents with   Shortness of Breath    [name] [name] is a 82 y.o. male.  82 year old male with past medical history of COPD and coronary artery disease presenting to the emergency department today with cough and shortness of breath.  The patient states this been going now for the past few days.  It was acutely worse today.  The patient states that he was feeling lightheaded today.  He was having difficulty catching his breath.  He came to the ER at that time for further evaluation.  The patient reports that he has had a cough that has been minimally productive.  He does have a history of COPD.  Denies any fevers.  He states he did have some chills this afternoon.   Shortness of Breath Associated symptoms: cough        Home Medications Prior to Admission medications   Medication Sig Start Date End Date Taking? Authorizing Provider  albuterol (VENTOLIN HFA) 108 (90 Base) MCG/ACT inhaler Inhale 2 puffs into the lungs every 4 (four) hours as needed for wheezing or shortness of breath. 03/19/23  Yes Durwin Glaze, MD  azithromycin (ZITHROMAX) 250 MG tablet Take 1 tablet (250 mg total) by mouth daily. Take 1 tablet by mouth daily (first dose administered in ER) 03/19/23  Yes Durwin Glaze, MD  predniSONE (DELTASONE) 50 MG tablet Take 1 tablet by mouth daily 03/19/23  Yes Durwin Glaze, MD  albuterol (PROVENTIL) (2.5 MG/3ML) 0.083% nebulizer solution Take 3 mLs (2.5 mg total) by nebulization every 6 (six) hours as needed for wheezing or shortness of breath. 09/10/22   Olalere, Adewale A, MD  albuterol (VENTOLIN HFA) 108 (90 Base) MCG/ACT inhaler Inhale 1-2 puffs into the lungs every 6 (six) hours as needed for wheezing or shortness of breath. 09/08/22   Tomma Lightning, MD  alfuzosin (UROXATRAL) 10 MG 24 hr tablet Take 1 tablet (10  mg total) by mouth daily with breakfast. 01/28/23   McKenzie, Mardene Celeste, MD  COMBIGAN 0.2-0.5 % ophthalmic solution Apply 1 drop to eye 2 (two) times daily. 10/08/20   [provider]  desonide (DESOWEN) 0.05 % cream Apply topically 2 (two) times daily. 06/22/21   [provider]  esomeprazole (NEXIUM) 20 MG capsule Take 20 mg by mouth daily.    [provider]  fluticasone-salmeterol (ADVAIR HFA) 230-21 MCG/ACT inhaler Inhale 2 puffs into the lungs 2 (two) times daily. 09/17/22   Olalere, Minna Antis, MD  hydroxypropyl methylcellulose / hypromellose (ISOPTO TEARS / GONIOVISC) 2.5 % ophthalmic solution Place 1 drop into both eyes 3 (three) times daily as needed for dry eyes.    [provider]  ipratropium (ATROVENT) 0.06 % nasal spray Place 2 sprays into both nostrils 4 (four) times daily as needed for rhinitis. 03/02/23   Tommie Sams, DO  loratadine (CLARITIN) 10 MG tablet Take 10 mg by mouth daily.    [provider]  LORazepam (ATIVAN) 0.5 MG tablet Take 1 tablet (0.5 mg total) by mouth at bedtime as needed for anxiety. 01/14/23   Tommie Sams, DO  lovastatin (MEVACOR) 40 MG tablet TAKE 1 TABLET BY MOUTH DAILY. 01/04/23   Tommie Sams, DO  magic mouthwash (nystatin, diphenhydrAMINE, alum & mag hydroxide) suspension mixture Swish and swallow 5 mLs 4 (four) times  daily as needed for mouth pain. 02/21/23   Tommie Sams, DO  nitrofurantoin, macrocrystal-monohydrate, (MACROBID) 100 MG capsule Take 1 capsule (100 mg total) by mouth 2 (two) times daily. 03/05/23   Tommie Sams, DO  nitroGLYCERIN (NITROSTAT) 0.4 MG SL tablet Place 1 tablet (0.4 mg total) under the tongue every 5 (five) minutes as needed for chest pain. Patient taking differently: Place 0.4 mg under the tongue every 5 (five) minutes x 3 doses as needed for chest pain (if no relief after 3rd dose, proceed to ED or call 911). 02/26/19   Wendall Stade, MD  oxymetazoline (AFRIN) 0.05 % nasal spray Place  1 spray into both nostrils 2 (two) times daily.    [provider]  polyethylene glycol (MIRALAX / GLYCOLAX) 17 g packet Take 17 g by mouth 2 (two) times daily. 12/21/22 03/21/23  Franky Macho, MD  psyllium (METAMUCIL) 58.6 % packet Take 1 packet by mouth 2 (two) times daily. 12/21/22 03/21/23  Franky Macho, MD  Spacer/Aero-Holding Rudean Curt Use as directed 02/17/22   Tomma Lightning, MD      Allergies    Bactrim [sulfamethoxazole-trimethoprim], Other, Beta adrenergic blockers, Lasix [furosemide], Penicillin g, Penicillin g sodium, Ciprofloxacin, Dexamethasone, Gabapentin, Methocarbamol, Neomycin, Penicillins, Sulfamethoxazole, and Tetracyclines & related    Review of Systems   Review of Systems  Respiratory:  Positive for cough and shortness of breath.   All other systems reviewed and are negative.   Physical Exam Updated Vital Signs BP (!) 152/98 (BP Location: Right Arm)   Pulse 93   Temp 98 F (36.7 C)   Resp 18   Ht 6\' 1"  (1.854 m)   Wt 93.2 kg   SpO2 95%   BMI 27.11 kg/m  Physical Exam Vitals and nursing note reviewed.   Gen: NAD Eyes: PERRL, EOMI HEENT: no oropharyngeal swelling Neck: trachea midline Resp: Managed with scattered wheezes Card: RRR, no murmurs, rubs, or gallops Abd: nontender, nondistended Extremities: no calf tenderness, no edema Vascular: 2+ radial pulses bilaterally, 2+ DP pulses bilaterally Skin: no rashes Psyc: acting appropriately   ED Results / Procedures / Treatments   Labs (all labs ordered are listed, but only abnormal results are displayed) Labs Reviewed  BASIC METABOLIC PANEL - Abnormal; Notable for the following components:      Result Value   Glucose, Bld 145 (*)    Calcium 8.8 (*)    All other components within normal limits  RESP PANEL BY RT-PCR (RSV, FLU A&B, COVID)  RVPGX2  CBC  TROPONIN I (HIGH SENSITIVITY)  TROPONIN I (HIGH SENSITIVITY)    EKG EKG Interpretation Date/Time:  Saturday March 19 2023 21:18:14 EDT Ventricular Rate:  91 PR Interval:    QRS Duration:  114 QT Interval:  372 QTC Calculation: 458 R Axis:   56  Text Interpretation: Incomplete analysis due to missing data in precordial lead(s) Atrial fibrillation Paired ventricular premature complexes Incomplete left bundle branch block Missing lead(s): V2 Confirmed by Beckey Downing [phone]) on 03/19/2023 11:31:01 PM  Radiology DG Chest Port 1 View  Result Date: 03/19/2023 CLINICAL DATA:  Dyspnea EXAM: PORTABLE CHEST 1 VIEW COMPARISON:  05/26/2020 FINDINGS: Stable eventration of the left hemidiaphragm. Unchanged mild left-sided volume loss. Lungs are clear. No pneumothorax or pleural effusion. Cardiac size within normal limits. Pulmonary vascularity is normal. No acute bone abnormality. IMPRESSION: 1. No active disease. Electronically Signed   By: Helyn Numbers M.D.   On: 03/19/2023 21:17    Procedures  Procedures    Medications Ordered in ED Medications  azithromycin (ZITHROMAX) tablet 500 mg (has no administration in time range)  ipratropium-albuterol (DUONEB) 0.5-2.5 (3) MG/3ML nebulizer solution 3 mL (3 mLs Nebulization Given 03/19/23 2135)  ipratropium-albuterol (DUONEB) 0.5-2.5 (3) MG/3ML nebulizer solution 3 mL (3 mLs Nebulization Given 03/19/23 2130)  ipratropium-albuterol (DUONEB) 0.5-2.5 (3) MG/3ML nebulizer solution 3 mL (3 mLs Nebulization Given 03/19/23 2120)  predniSONE (DELTASONE) tablet 60 mg (60 mg Oral Given 03/19/23 2111)    ED Course/ Medical Decision Making/ A&P                                 Medical Decision Making 82 year old male with past medical history of coronary artery disease and COPD presenting to the emergency department today with cough and shortness of breath as well as a feelings of lightheadedness earlier today.  I will further evaluate the patient here with basic labs well as an EKG, chest x-ray, and troponin for further evaluation for ACS, pulmonary edema, pulmonary infiltrates,  pneumothorax.  I will give the patient DuoNeb's here as well as steroids as he does appear to be wheezing here on exam.  Also obtain a COVID and flu swab on the patient here.  I will reevaluate for ultimate disposition.  The patient's EKG is difficult to interpret due to a lot of baseline artifact.  This does appear to be in a regular rhythm with a PVC noted with nonspecific ST-T changes.  Unclear if this is atrial fibrillation or MAT given his history of COPD.  A repeat EKG is ordered.  The patient's troponin here is negative.  His labs are reassuring here.  His chest x-ray is clear.  I will give the patient antibiotics and steroids and he will be treated for COPD exacerbation if he does not appear to be in new onset atrial fibrillation.  The patient second EKG interpreted by me does appear to show a sinus rhythm with few PACs and PVCs.  There are nonspecific ST-T changes.  The patient is stable for discharge.  Amount and/or Complexity of Data Reviewed Labs: ordered. Radiology: ordered.  Risk Prescription drug management.           Final Clinical Impression(s) / ED Diagnoses Final diagnoses:  COPD exacerbation (HCC)    Rx / DC Orders ED Discharge Orders          Ordered    predniSONE (DELTASONE) 50 MG tablet        03/19/23 2345    azithromycin (ZITHROMAX) 250 MG tablet  Daily        03/19/23 2345    albuterol (VENTOLIN HFA) 108 (90 Base) MCG/ACT inhaler  Every 4 hours PRN        03/19/23 2345              Durwin Glaze, MD 03/19/23 2346

## 2023-03-19 NOTE — Discharge Instructions (Signed)
Your workup today was reassuring.  Please take the medications as prescribed and use 2 puffs of the albuterol every 4 hours for the next 24 to 48 hours.  Please follow-up with your doctor.  Return to the ER for worsening symptoms.

## 2023-03-20 DIAGNOSIS — J441 Chronic obstructive pulmonary disease with (acute) exacerbation: Secondary | ICD-10-CM | POA: Diagnosis not present

## 2023-03-24 ENCOUNTER — Ambulatory Visit (INDEPENDENT_AMBULATORY_CARE_PROVIDER_SITE_OTHER): Payer: Medicare Other | Admitting: Gastroenterology

## 2023-03-28 ENCOUNTER — Telehealth: Payer: Self-pay | Admitting: Pulmonary Disease

## 2023-03-28 DIAGNOSIS — J441 Chronic obstructive pulmonary disease with (acute) exacerbation: Secondary | ICD-10-CM

## 2023-03-28 NOTE — Telephone Encounter (Signed)
Patient would like to switch his pulmonary care from Dr. Wynona Neat to Dr [name].  He does not trust his driving anymore and needs care closer to home

## 2023-03-28 NOTE — Telephone Encounter (Signed)
fluticasone-salmeterol (ADVAIR HFA) 230-21 MCG/ACT inhaler  [address] - [place], [place] - [address] [place]

## 2023-03-28 NOTE — Telephone Encounter (Signed)
Fine with me but needs to bring all meds x new pt slot   Will need one of the NPs to see him if any sleep medicine issues

## 2023-03-29 ENCOUNTER — Ambulatory Visit: Payer: Medicare Other | Admitting: Family Medicine

## 2023-03-29 ENCOUNTER — Encounter: Payer: Self-pay | Admitting: Family Medicine

## 2023-03-29 VITALS — BP 114/74 | HR 92 | Temp 97.8°F | Ht 73.0 in | Wt 206.0 lb

## 2023-03-29 DIAGNOSIS — R300 Dysuria: Secondary | ICD-10-CM

## 2023-03-29 DIAGNOSIS — J449 Chronic obstructive pulmonary disease, unspecified: Secondary | ICD-10-CM | POA: Diagnosis not present

## 2023-03-29 LAB — POCT URINALYSIS DIP (CLINITEK)
Bilirubin, UA: NEGATIVE
Blood, UA: NEGATIVE
Glucose, UA: NEGATIVE mg/dL
Ketones, POC UA: NEGATIVE mg/dL
Leukocytes, UA: NEGATIVE
Nitrite, UA: NEGATIVE
Spec Grav, UA: 1.01 (ref 1.010–1.025)
Urobilinogen, UA: 0.2 U/dL
pH, UA: 7 (ref 5.0–8.0)

## 2023-03-29 MED ORDER — BREZTRI AEROSPHERE 160-9-4.8 MCG/ACT IN AERO: 2.0000 | INHALATION_SPRAY | Freq: Two times a day (BID) | RESPIRATORY_TRACT | 11 refills | Status: DC

## 2023-03-29 MED ORDER — NITROFURANTOIN MONOHYD MACRO 100 MG PO CAPS: 100.0000 mg | ORAL_CAPSULE | Freq: Two times a day (BID) | ORAL | 0 refills | Status: DC

## 2023-03-29 NOTE — Patient Instructions (Signed)
Antibiotic while awaiting culture.  I sent in [place] for COPD.  Follow up in with pulmonology.

## 2023-03-30 ENCOUNTER — Encounter: Payer: Self-pay | Admitting: Family Medicine

## 2023-03-30 NOTE — Progress Notes (Signed)
Subjective:  Patient ID: [name] [name], male    DOB: 24-May-1940  Age: 82 y.o. MRN: [id number]  CC:   Chief Complaint  Patient presents with   urinary frequency and urgency     Got up about 6 times to urinate last night Chills and body aches    Cough    And nasal congestion completed medication , trouble breathing had hospital visit a week ago    HPI:  82 year old male presents for evaluation of the above.  Patient continues to have frequent COPD exacerbations.  Recently seen by me on 10/7 for COPD exacerbation and recently treated on 11/2.  Needs to see his pulmonologist.  States that he has difficulty getting in.  He is currently on Advair.  Will discuss change in therapy to [facility] : [facility]..  Patient reports that he is concerned about recurrence of UTI.  He states that last night he had dysuria and urinary urgency.  He states he has had chills.  He is felt achy.  No documented fever.  Recently had Enterococcus UTI.  Patient Active Problem List   Diagnosis Date Noted   Dysuria 03/02/2023   Celiac artery stenosis (HCC) 02/02/2023   Chronic idiopathic constipation 12/21/2022   Osteoarthritis of knees, bilateral 04/14/2022   Irritable bowel syndrome 03/17/2022   Recurrent right lower quadrant abdominal pain 03/03/2022   Seasonal allergies 08/06/2021   COPD (chronic obstructive pulmonary disease) (HCC) 04/23/2021   BPH (benign prostatic hyperplasia) 04/23/2021   CAD (coronary artery disease) 04/23/2021   Hypertension 09/16/2016   Lumbar spondylosis 11/05/2014   GERD (gastroesophageal reflux disease) 01/19/2012   Hyperlipidemia 04/15/2008   AAA (abdominal aortic aneurysm) (HCC) 04/15/2008    Social Hx   Social History   Socioeconomic History   Marital status: Widowed    Spouse name: Not on file   Number of children: Not on file   Years of education: Not on file   Highest education level: Not on file  Occupational History   Not on file  Tobacco Use   Smoking status: Former     Current packs/day: 0.00    Average packs/day: 1 pack/day for 35.0 years (35.0 ttl pk-yrs)    Types: Cigarettes    Start date: 03/27/1968    Quit date: 03/28/2003    Years since quitting: 20.0   Smokeless tobacco: Never  Vaping Use   Vaping status: Never Used  Substance and Sexual Activity   Alcohol use: No    Alcohol/week: 0.0 standard drinks of alcohol   Drug use: No   Sexual activity: Not Currently    Birth control/protection: None  Other Topics Concern   Not on file  Social History Narrative   Not on file   Social Determinants of Health   Financial Resource Strain: Low Risk  (03/18/2022)   Overall Financial Resource Strain (CARDIA)    Difficulty of Paying Living Expenses: Not very hard  Food Insecurity: No Food Insecurity (03/18/2022)   Hunger Vital Sign    Worried About Running Out of Food in the Last Year: Never true    Ran Out of Food in the Last Year: Never true  Transportation Needs: No Transportation Needs (03/18/2022)   PRAPARE - Administrator, Civil Service (Medical): No    Lack of Transportation (Non-Medical): No  Physical Activity: Insufficiently Active (03/18/2022)   Exercise Vital Sign    Days of Exercise per Week: 2 days    Minutes of Exercise per Session: 20 min  Stress: No Stress Concern Present (03/18/2022)   [employer] of Occupational Health - Occupational Stress Questionnaire    Feeling of Stress : Not at all  Social Connections: Socially Isolated (03/18/2022)   Social Connection and Isolation Panel [NHANES]    Frequency of Communication with Friends and Family: More than three times a week    Frequency of Social Gatherings with Friends and Family: Three times a week    Attends Religious Services: Never    Active Member of Clubs or Organizations: No    Attends Banker Meetings: Never    Marital Status: Widowed    Review of Systems Per HPI  Objective:  BP 114/74   Pulse 92   Temp 97.8 F (36.6 C)   Ht 6\' 1"   (1.854 m)   Wt 206 lb (93.4 kg)   SpO2 96%   BMI 27.18 kg/m      03/29/2023    3:07 PM 03/19/2023   11:45 PM 03/19/2023    8:29 PM  BP/Weight  Systolic BP 114 148   Diastolic BP 74 94   Wt. (Lbs) 206  205.47  BMI 27.18 kg/m2  27.11 kg/m2    Physical Exam Vitals and nursing note reviewed.  Constitutional:      General: He is not in acute distress.    Appearance: Normal appearance.  HENT:     Head: Normocephalic and atraumatic.  Cardiovascular:     Rate and Rhythm: Normal rate and regular rhythm.  Pulmonary:     Effort: Pulmonary effort is normal.     Breath sounds: Normal breath sounds. No wheezing, rhonchi or rales.  Abdominal:     General: There is no distension.     Palpations: Abdomen is soft.     Tenderness: There is no abdominal tenderness.  Neurological:     Mental Status: He is alert.     Lab Results  Component Value Date   WBC 9.0 03/19/2023   HGB 13.5 03/19/2023   HCT 41.4 03/19/2023   PLT 244 03/19/2023   GLUCOSE 145 (H) 03/19/2023   CHOL 124 04/23/2021   TRIG 109 04/23/2021   HDL 39 (L) 04/23/2021   LDLCALC 65 04/23/2021   ALT 16 06/29/2022   AST 18 06/29/2022   NA 136 03/19/2023   K 3.7 03/19/2023   CL 100 03/19/2023   CREATININE 0.92 03/19/2023   BUN 11 03/19/2023   CO2 29 03/19/2023   TSH 1.230 02/14/2019   PSA 1.80 07/26/2014   HGBA1C 6.1 (H) 04/23/2021     Assessment & Plan:   Problem List Items Addressed This Visit       Respiratory   COPD (chronic obstructive pulmonary disease) (HCC) - Primary    Uncontrolled.  Recurrent exacerbations.  Starting [employer].      Relevant Medications   Budeson-Glycopyrrol-Formoterol (BREZTRI AEROSPHERE) 160-9-4.8 MCG/ACT AERO     Other   Dysuria    UA negative here today.  However, his urinalysis was negative for pyuria the last time urine culture grew Enterococcus.  Placing on Macrobid while awaiting culture.      Relevant Orders   POCT URINALYSIS DIP (CLINITEK) (Completed)   Urine  Culture    Meds ordered this encounter  Medications   nitrofurantoin, macrocrystal-monohydrate, (MACROBID) 100 MG capsule    Sig: Take 1 capsule (100 mg total) by mouth 2 (two) times daily.    Dispense:  14 capsule    Refill:  0   Budeson-Glycopyrrol-Formoterol (BREZTRI AEROSPHERE) 160-9-4.8 MCG/ACT AERO  Sig: Inhale 2 puffs into the lungs 2 (two) times daily.    Dispense:  10.7 g    Refill:  11    Marshelle Bilger DO [facility]

## 2023-03-30 NOTE — Assessment & Plan Note (Signed)
UA negative here today.  However, his urinalysis was negative for pyuria the last time urine culture grew Enterococcus.  Placing on Macrobid while awaiting culture.

## 2023-03-30 NOTE — Assessment & Plan Note (Addendum)
Uncontrolled.  Recurrent exacerbations.  Starting [employer].

## 2023-03-31 ENCOUNTER — Telehealth: Payer: Self-pay | Admitting: Family Medicine

## 2023-03-31 DIAGNOSIS — E785 Hyperlipidemia, unspecified: Secondary | ICD-10-CM

## 2023-03-31 LAB — URINE CULTURE

## 2023-03-31 LAB — SPECIMEN STATUS REPORT

## 2023-03-31 MED ORDER — FLUTICASONE-SALMETEROL 230-21 MCG/ACT IN AERO: 2.0000 | INHALATION_SPRAY | Freq: Two times a day (BID) | RESPIRATORY_TRACT | 5 refills | Status: DC

## 2023-03-31 NOTE — Telephone Encounter (Signed)
Refill on  lovastatin (MEVACOR) 40 MG tablet  [facility]

## 2023-03-31 NOTE — Telephone Encounter (Signed)
Patient checking on message for Advair inhaler. Patient almost out of medication. Patient phone number is [phone] and [phone].

## 2023-03-31 NOTE — Telephone Encounter (Signed)
Advair refilled  Called pt and left detailed msg that this was done

## 2023-03-31 NOTE — Telephone Encounter (Signed)
Dr Val Eagle are you ok with switch?

## 2023-04-01 MED ORDER — LOVASTATIN 40 MG PO TABS: 40.0000 mg | ORAL_TABLET | Freq: Every day | ORAL | 0 refills | Status: DC

## 2023-04-04 ENCOUNTER — Other Ambulatory Visit: Payer: Self-pay | Admitting: Family Medicine

## 2023-04-04 MED ORDER — TRELEGY ELLIPTA 100-62.5-25 MCG/ACT IN AEPB: 1.0000 | INHALATION_SPRAY | Freq: Every day | RESPIRATORY_TRACT | 11 refills | Status: DC

## 2023-04-07 NOTE — Telephone Encounter (Signed)
Switch is okay with me

## 2023-04-07 NOTE — Telephone Encounter (Signed)
Please schedule new pt visit with Dr.Wert he needs to bring all his meds. If he has any sleep medicine issues, he will need to see one of the NPs. Both physicians have agreed on the switch.

## 2023-04-11 ENCOUNTER — Ambulatory Visit (INDEPENDENT_AMBULATORY_CARE_PROVIDER_SITE_OTHER): Payer: Medicare Other | Admitting: Family Medicine

## 2023-04-11 VITALS — BP 145/92 | HR 74 | Temp 97.9°F | Ht 73.0 in | Wt 207.0 lb

## 2023-04-11 DIAGNOSIS — J320 Chronic maxillary sinusitis: Secondary | ICD-10-CM | POA: Insufficient documentation

## 2023-04-11 DIAGNOSIS — J0100 Acute maxillary sinusitis, unspecified: Secondary | ICD-10-CM | POA: Diagnosis not present

## 2023-04-11 MED ORDER — DOXYCYCLINE HYCLATE 100 MG PO TABS: 100.0000 mg | ORAL_TABLET | Freq: Two times a day (BID) | ORAL | 0 refills | Status: DC

## 2023-04-11 NOTE — Assessment & Plan Note (Signed)
We had a lengthy discussion today about treatment options especially in the setting of his allergies/intolerance.  Starting on doxycycline.

## 2023-04-11 NOTE — Progress Notes (Signed)
Subjective:  Patient ID: [name] [name], male    DOB: Jul 15, 1940  Age: 82 y.o. MRN: [id number]  CC:  ? Sinus infection   HPI:  82 year old male presents for evaluation of the above.  Patient reports ongoing/persistent tenderness to the left maxillary sinus.  Recently had a dental issue last week.  This has resolved.  However, he has had no issues with the teeth near the maxillary sinus.  He reports that the area is very tender.  He has been congested.  He has been taking over-the-counter decongestants without relief.  Patient is concerned that he has sinusitis.  Patient Active Problem List   Diagnosis Date Noted   Maxillary sinusitis 04/11/2023   Celiac artery stenosis (HCC) 02/02/2023   Chronic idiopathic constipation 12/21/2022   Osteoarthritis of knees, bilateral 04/14/2022   Irritable bowel syndrome 03/17/2022   Recurrent right lower quadrant abdominal pain 03/03/2022   Seasonal allergies 08/06/2021   COPD (chronic obstructive pulmonary disease) (HCC) 04/23/2021   BPH (benign prostatic hyperplasia) 04/23/2021   CAD (coronary artery disease) 04/23/2021   Hypertension 09/16/2016   Lumbar spondylosis 11/05/2014   GERD (gastroesophageal reflux disease) 01/19/2012   Hyperlipidemia 04/15/2008   AAA (abdominal aortic aneurysm) (HCC) 04/15/2008    Social Hx   Social History   Socioeconomic History   Marital status: Widowed    Spouse name: Not on file   Number of children: Not on file   Years of education: Not on file   Highest education level: Not on file  Occupational History   Not on file  Tobacco Use   Smoking status: Former    Current packs/day: 0.00    Average packs/day: 1 pack/day for 35.0 years (35.0 ttl pk-yrs)    Types: Cigarettes    Start date: 03/27/1968    Quit date: 03/28/2003    Years since quitting: 20.0   Smokeless tobacco: Never  Vaping Use   Vaping status: Never Used  Substance and Sexual Activity   Alcohol use: No    Alcohol/week: 0.0 standard  drinks of alcohol   Drug use: No   Sexual activity: Not Currently    Birth control/protection: None  Other Topics Concern   Not on file  Social History Narrative   Not on file   Social Determinants of Health   Financial Resource Strain: Low Risk  (03/18/2022)   Overall Financial Resource Strain (CARDIA)    Difficulty of Paying Living Expenses: Not very hard  Food Insecurity: No Food Insecurity (03/18/2022)   Hunger Vital Sign    Worried About Running Out of Food in the Last Year: Never true    Ran Out of Food in the Last Year: Never true  Transportation Needs: No Transportation Needs (03/18/2022)   PRAPARE - Administrator, Civil Service (Medical): No    Lack of Transportation (Non-Medical): No  Physical Activity: Insufficiently Active (03/18/2022)   Exercise Vital Sign    Days of Exercise per Week: 2 days    Minutes of Exercise per Session: 20 min  Stress: No Stress Concern Present (03/18/2022)   [employer] of Occupational Health - Occupational Stress Questionnaire    Feeling of Stress : Not at all  Social Connections: Socially Isolated (03/18/2022)   Social Connection and Isolation Panel [NHANES]    Frequency of Communication with Friends and Family: More than three times a week    Frequency of Social Gatherings with Friends and Family: Three times a week    Attends  Religious Services: Never    Active Member of Clubs or Organizations: No    Attends Banker Meetings: Never    Marital Status: Widowed    Review of Systems Per HPI  Objective:  BP (!) 145/92   Pulse 74   Temp 97.9 F (36.6 C)   Ht 6\' 1"  (1.854 m)   Wt 207 lb (93.9 kg)   SpO2 97%   BMI 27.31 kg/m      04/11/2023    2:36 PM 03/29/2023    3:07 PM 03/19/2023   11:45 PM  BP/Weight  Systolic BP 145 114 148  Diastolic BP 92 74 94  Wt. (Lbs) 207 206   BMI 27.31 kg/m2 27.18 kg/m2     Physical Exam Vitals and nursing note reviewed.  Constitutional:      General: He is not  in acute distress.    Appearance: Normal appearance.  HENT:     Head: Normocephalic and atraumatic.     Nose:     Comments: Exquisite tenderness over the left maxillary sinus. Eyes:     General:        Right eye: No discharge.        Left eye: No discharge.     Conjunctiva/sclera: Conjunctivae normal.  Cardiovascular:     Rate and Rhythm: Normal rate and regular rhythm.  Pulmonary:     Effort: Pulmonary effort is normal.     Breath sounds: No wheezing or rales.  Neurological:     Mental Status: He is alert.     Lab Results  Component Value Date   WBC 9.0 03/19/2023   HGB 13.5 03/19/2023   HCT 41.4 03/19/2023   PLT 244 03/19/2023   GLUCOSE 145 (H) 03/19/2023   CHOL 124 04/23/2021   TRIG 109 04/23/2021   HDL 39 (L) 04/23/2021   LDLCALC 65 04/23/2021   ALT 16 06/29/2022   AST 18 06/29/2022   NA 136 03/19/2023   K 3.7 03/19/2023   CL 100 03/19/2023   CREATININE 0.92 03/19/2023   BUN 11 03/19/2023   CO2 29 03/19/2023   TSH 1.230 02/14/2019   PSA 1.80 07/26/2014   HGBA1C 6.1 (H) 04/23/2021     Assessment & Plan:   Problem List Items Addressed This Visit       Respiratory   Maxillary sinusitis - Primary    We had a lengthy discussion today about treatment options especially in the setting of his allergies/intolerance.  Starting on doxycycline.      Relevant Medications   doxycycline (VIBRA-TABS) 100 MG tablet    Meds ordered this encounter  Medications   doxycycline (VIBRA-TABS) 100 MG tablet    Sig: Take 1 tablet (100 mg total) by mouth 2 (two) times daily. Take with food and water.    Dispense:  14 tablet    Refill:  0    Has intolerance to tetracycline according to the chart. Patient unsure. We have discussed and he is willing to try.    Follow-up:  Return if symptoms worsen or fail to improve.  Everlene Other DO [facility]

## 2023-04-16 ENCOUNTER — Ambulatory Visit
Admission: EM | Admit: 2023-04-16 | Discharge: 2023-04-16 | Disposition: A | Discharge status: Home / Self Care | Payer: Medicare Other | Attending: Family Medicine | Admitting: Family Medicine

## 2023-04-16 DIAGNOSIS — M79601 Pain in right arm: Secondary | ICD-10-CM | POA: Diagnosis not present

## 2023-04-16 MED ORDER — CYCLOBENZAPRINE HCL 5 MG PO TABS: 5.0000 mg | ORAL_TABLET | Freq: Three times a day (TID) | ORAL | 0 refills | Status: DC | PRN

## 2023-04-16 MED ORDER — PREDNISONE 20 MG PO TABS: 40.0000 mg | ORAL_TABLET | Freq: Every day | ORAL | 0 refills | Status: DC

## 2023-04-16 NOTE — Discharge Instructions (Signed)
I suspect your pain to be related to muscular spasms or tendinitis.  I have sent in a course of prednisone as the over-the-counter pain relievers are not helping as well as a muscle relaxer to take 3 times daily as needed.  Continue your good home care with heat, massage, gentle stretches and follow-up with your primary care provider soon as possible.  Go to the emergency department if significantly worsening at any point.

## 2023-04-16 NOTE — ED Triage Notes (Signed)
Pt reports he has right upper arm discomfort x 4 days.    Took otc pain relief but no relief. Denies injury

## 2023-04-18 ENCOUNTER — Emergency Department (HOSPITAL_COMMUNITY): Payer: Medicare Other

## 2023-04-18 ENCOUNTER — Ambulatory Visit: Payer: Self-pay | Admitting: Family Medicine

## 2023-04-18 ENCOUNTER — Emergency Department (HOSPITAL_COMMUNITY)
Admission: EM | Admit: 2023-04-18 | Discharge: 2023-04-18 | Disposition: A | Discharge status: Home / Self Care | Payer: Medicare Other | Attending: Emergency Medicine | Admitting: Emergency Medicine

## 2023-04-18 ENCOUNTER — Encounter (HOSPITAL_COMMUNITY): Payer: Self-pay | Admitting: *Deleted

## 2023-04-18 ENCOUNTER — Ambulatory Visit: Payer: Medicare Other | Admitting: Family Medicine

## 2023-04-18 ENCOUNTER — Other Ambulatory Visit: Payer: Self-pay

## 2023-04-18 DIAGNOSIS — M47812 Spondylosis without myelopathy or radiculopathy, cervical region: Secondary | ICD-10-CM | POA: Diagnosis not present

## 2023-04-18 DIAGNOSIS — M4802 Spinal stenosis, cervical region: Secondary | ICD-10-CM | POA: Diagnosis not present

## 2023-04-18 DIAGNOSIS — M19011 Primary osteoarthritis, right shoulder: Secondary | ICD-10-CM | POA: Diagnosis not present

## 2023-04-18 DIAGNOSIS — M25511 Pain in right shoulder: Secondary | ICD-10-CM | POA: Insufficient documentation

## 2023-04-18 DIAGNOSIS — M542 Cervicalgia: Secondary | ICD-10-CM | POA: Diagnosis not present

## 2023-04-18 DIAGNOSIS — M79601 Pain in right arm: Secondary | ICD-10-CM | POA: Insufficient documentation

## 2023-04-18 MED ORDER — OXYCODONE-ACETAMINOPHEN 5-325 MG PO TABS: 1.0000 | ORAL_TABLET | Freq: Four times a day (QID) | ORAL | 0 refills | Status: DC | PRN

## 2023-04-18 NOTE — ED Provider Notes (Signed)
[name] AT [facility] Provider Note   CSN: [id number] Arrival date & time: 04/18/23  1306     History {Add pertinent medical, surgical, social history, OB history to HPI:1} Chief Complaint  Patient presents with   Arm Pain    right    [name] [name] is a 82 y.o. male.  Patient complains of pain in his right arm and right shoulder for a number days.  Sometimes is worse when the arm is hanging.  He has a history of surgery to his cervical spine   Arm Pain       Home Medications Prior to Admission medications   Medication Sig Start Date End Date Taking? Authorizing Provider  Fluticasone-Umeclidin-Vilant (TRELEGY ELLIPTA) 100-62.5-25 MCG/ACT AEPB Inhale 1 puff into the lungs daily. 04/04/23   Tommie Sams, DO  oxyCODONE-acetaminophen (PERCOCET/ROXICET) 5-325 MG tablet Take 1 tablet by mouth every 6 (six) hours as needed for severe pain (pain score 7-10). 04/18/23  Yes Bethann Berkshire, MD  albuterol (PROVENTIL) (2.5 MG/3ML) 0.083% nebulizer solution Take 3 mLs (2.5 mg total) by nebulization every 6 (six) hours as needed for wheezing or shortness of breath. 09/10/22   Olalere, Adewale A, MD  albuterol (VENTOLIN HFA) 108 (90 Base) MCG/ACT inhaler Inhale 1-2 puffs into the lungs every 6 (six) hours as needed for wheezing or shortness of breath. 09/08/22   Tomma Lightning, MD  albuterol (VENTOLIN HFA) 108 (90 Base) MCG/ACT inhaler Inhale 2 puffs into the lungs every 4 (four) hours as needed for wheezing or shortness of breath. 03/19/23   Durwin Glaze, MD  alfuzosin (UROXATRAL) 10 MG 24 hr tablet Take 1 tablet (10 mg total) by mouth daily with breakfast. 01/28/23   McKenzie, Mardene Celeste, MD  COMBIGAN 0.2-0.5 % ophthalmic solution Apply 1 drop to eye 2 (two) times daily. 10/08/20   [provider]  cyclobenzaprine (FLEXERIL) 5 MG tablet Take 1 tablet (5 mg total) by mouth 3 (three) times daily as needed for muscle spasms. Do not drink alcohol or drive  while taking this medication.  May cause drowsiness. 04/16/23   Particia Nearing, PA-C  doxycycline (VIBRA-TABS) 100 MG tablet Take 1 tablet (100 mg total) by mouth 2 (two) times daily. Take with food and water. 04/11/23   Tommie Sams, DO  esomeprazole (NEXIUM) 20 MG capsule Take 20 mg by mouth daily.    [provider]  fluticasone-salmeterol (ADVAIR HFA) 230-21 MCG/ACT inhaler Inhale 2 puffs into the lungs 2 (two) times daily. 03/31/23   Olalere, Onnie Boer A, MD  ipratropium (ATROVENT) 0.06 % nasal spray Place 2 sprays into both nostrils 4 (four) times daily as needed for rhinitis. 03/02/23   Tommie Sams, DO  loratadine (CLARITIN) 10 MG tablet Take 10 mg by mouth daily.    [provider]  LORazepam (ATIVAN) 0.5 MG tablet Take 1 tablet (0.5 mg total) by mouth at bedtime as needed for anxiety. 01/14/23   Tommie Sams, DO  lovastatin (MEVACOR) 40 MG tablet Take 1 tablet (40 mg total) by mouth daily. 04/01/23   Tommie Sams, DO  nitrofurantoin, macrocrystal-monohydrate, (MACROBID) 100 MG capsule Take 1 capsule (100 mg total) by mouth 2 (two) times daily. 03/29/23   Tommie Sams, DO  nitroGLYCERIN (NITROSTAT) 0.4 MG SL tablet Place 1 tablet (0.4 mg total) under the tongue every 5 (five) minutes as needed for chest pain. Patient taking differently: Place 0.4 mg under the tongue every 5 (five) minutes  x 3 doses as needed for chest pain (if no relief after 3rd dose, proceed to ED or call 911). 02/26/19   Wendall Stade, MD  oxymetazoline (AFRIN) 0.05 % nasal spray Place 1 spray into both nostrils 2 (two) times daily.    [provider]  predniSONE (DELTASONE) 20 MG tablet Take 2 tablets (40 mg total) by mouth daily with breakfast. 04/16/23   Particia Nearing, PA-C  Spacer/Aero-Holding Deretha Emory DEVI Use as directed 02/17/22   Tomma Lightning, MD      Allergies    Bactrim [sulfamethoxazole-trimethoprim], Other, Beta adrenergic blockers, Lasix [furosemide],  Penicillin g, Penicillin g sodium, Ciprofloxacin, Dexamethasone, Gabapentin, Methocarbamol, Neomycin, Penicillins, Sulfamethoxazole, and Tetracyclines & related    Review of Systems   Review of Systems  Physical Exam Updated Vital Signs BP (!) 154/94   Pulse 77   Temp 98 F (36.7 C) (Oral)   Resp (!) 22   Ht 6\' 1"  (1.854 m)   Wt 91.2 kg   SpO2 98%   BMI 26.52 kg/m  Physical Exam  ED Results / Procedures / Treatments   Labs (all labs ordered are listed, but only abnormal results are displayed) Labs Reviewed - No data to display  EKG None  Radiology No results found.  Procedures Procedures  {Document cardiac monitor, telemetry assessment procedure when appropriate:1}  Medications Ordered in ED Medications - No data to display  ED Course/ Medical Decision Making/ A&P   {   Click here for ABCD2, HEART and other calculatorsREFRESH Note before signing :1}                              Medical Decision Making Amount and/or Complexity of Data Reviewed Radiology: ordered.  Risk Prescription drug management.   Patient with pain in his right arm most likely musculoskeletal.  He is started on some Percocet and referred to orthopedics  {Document critical care time when appropriate:1} {Document review of labs and clinical decision tools ie heart score, Chads2Vasc2 etc:1}  {Document your independent review of radiology images, and any outside records:1} {Document your discussion with family members, caretakers, and with consultants:1} {Document social determinants of health affecting pt's care:1} {Document your decision making why or why not admission, treatments were needed:1} Final Clinical Impression(s) / ED Diagnoses Final diagnoses:  Right arm pain    Rx / DC Orders ED Discharge Orders          Ordered    oxyCODONE-acetaminophen (PERCOCET/ROXICET) 5-325 MG tablet  Every 6 hours PRN        04/18/23 2020

## 2023-04-18 NOTE — ED Notes (Signed)
Pt in waiting area with nad. A/o.

## 2023-04-18 NOTE — Discharge Instructions (Addendum)
Follow-up with Dr. Romeo Apple or one of his colleagues within the next week for recheck

## 2023-04-18 NOTE — Telephone Encounter (Addendum)
Copied from CRM [id number]. Topic: Clinical - Red Word Triage >> Apr 18, 2023  8:00 AM Donita Brooks wrote: Red Word that prompted transfer to Nurse Triage: pt is experinceingallergic reaction  bliss on his lip, mouth is sore, may have blisters inside of his mouth. pain in right arm, thoart is irrated   Chief Complaint: Allergic reaction Symptoms: Lips and tongue swollen, blisters to lips and inside mouth 6 or 7/10 pain, hives on arms, right arm pain 10/10, "extremely dry in mouth and throat," "lightheadedness" intermittently, some numbness to fingers Frequency: Symptoms started 2-3 days after starting doxycycline Pertinent Negatives: Patient denies more trouble breathing than usual, chest pain Disposition: [x] ED /[] Urgent Care (no appt availability in office) / [] Appointment(In office/virtual)/ []  [facility]/ [] Home Care/ [] Refused Recommended Disposition /[] [facility]/ []  Follow-up with PCP Additional Notes: Pt reporting that he and doc decided he could try antibx that pt was previously intolerant to, last taken doxycycline decades ago, for sinus infection. Pt reporting extensive hx of allergic reactions and intolerances to antibiotic meds. Pt reporting that he started having symptoms of what he thought was allergic reaction 2-3 days after taking doxycycline, "kept taking it, minor" reaction. Pt reporting he was seen in urgent care on Saturday, was prescribed flexeril and steroid, been taking as prescribed, urgent care told him to see Dr. Adriana Simas first thing Monday morning. Pt reporting hives on arms, lip swelling and blisters, tongue swelling, "extremely dry in mouth and throat," "voice has gotten worse," still swollen, confirms symptoms not better or worse since urgent care, confirms no longer taking doxycycline. Pt confirms no more trouble breathing than usual with his COPD. Pt reporting no epi pen prescribed or used. Advised ED, pt refused, requesting to see Dr. Adriana Simas. Pt also reporting  pain in right arm between shoulder and elbow that is 10/10 pain, been taking ibuprofen 3200 mg/day but "not touching" the pain. Advised pt go to ED, pt refused, only wanting appt with Dr. Adriana Simas. Scheduled first available appt with Dr. Adriana Simas to ensure pt was examined, placed pt on wait list. Advised ED in meantime especially if any worsening of symptoms. Pt verbalized that he would go to ED if he felt needed for symptoms.  Reason for Disposition  Swollen tongue  Answer Assessment - Initial Assessment Questions 1. ONSET: "When did the swelling start?" (e.g., minutes, hours, days)     Started 2-3 days after starting doxycycline for sinus infection 2. SEVERITY: "How swollen is it?"     Patient unable to determine, but confirms swelling 3. ITCHING: "Is there any itching?" If Yes, ask: "How much?"   (Scale 1-10; mild, moderate or severe)     Not constant but itching "all over" intermittently 4. PAIN: "Is the swelling painful to touch?" If Yes, ask: "How painful is it?"   (Scale 1-10; mild, moderate or severe)     Blisters 6 or 7/10 pain, right arm pain 10/10 "may be unrelated" but patient suspects they are related 5. CAUSE: "What do you think is causing the lip swelling?"     Allergic reaction to doxycycline 6. RECURRENT SYMPTOM: "Have you had lip swelling before?" If Yes, ask: "When was the last time?" "What happened that time?"     Summer 2024 reacted to Bactrim with hives 7. OTHER SYMPTOMS: "Do you have any other symptoms?" (e.g., toothache)     Hives on arms, lip and tongue swelling, blisters to lips and inside mouth, dry mouth  Protocols used: Lip Swelling-A-AH, Hives-A-AH

## 2023-04-18 NOTE — ED Triage Notes (Addendum)
Pt with right arm pain and allergic reaction to doxycycline.  Right arm pain since Tuesday night, progressively worse.  Pt seen at [facility] on Saturday, placed on flexeril and prednisone.  Pain is about the same even with taking prednisone and flexeril.  Pt with swelling to lips off and on since starting Doxycycline on Thursday for a sinus infection.  Pt with itching,  no new rashes.  Pt denies new SOB, pt with hx of COPD.

## 2023-04-19 NOTE — ED Provider Notes (Signed)
[facility]    CSN: [id number] Arrival date & time: 04/16/23  1331      History   Chief Complaint No chief complaint on file.   HPI [name] [name] is a 82 y.o. male.   Patient presenting today with 4-day history of severe right upper arm pain.  States the pain is constant but worse with movement.  Denies any known injury to the area, bruising, swelling, redness, discoloration, numbness or tingling to the arm, loss of range of motion.  Trying over-the-counter pain relievers with no relief.  No past history of similar issues.  Does have a history of arthritis.    Past Medical History:  Diagnosis Date   AAA (abdominal aortic aneurysm) (HCC)    needs yearly ultrasound   Allergy    Anemia    Arthritis    Asthma    BCC (basal cell carcinoma) 08/18/1989   left shoulder blad, upper right arm, left arm beyond elbow, c&d   BCC (basal cell carcinoma) 01/31/1992   Posterior neck, curetx3, 66fu   BCC (basal cell carcinoma) 11/22/2001   mid forehead, cx3, excision, right forearm, cx3, 94fu   BCC (basal cell carcinoma) 10/09/2003   mid forehead, MOHs   BCC (basal cell carcinoma) 08/15/2008   upper left back, biopsy   BPH (benign prostatic hyperplasia)    CAD (coronary artery disease)    Cancer (HCC)    skin cancer   COPD (chronic obstructive pulmonary disease) (HCC)    Dysrhythmia    pt. states it can be fast at times   GERD (gastroesophageal reflux disease)    Glaucoma    History of acute pancreatitis 12/21/2022   HOH (hard of hearing)    Hypercholesterolemia    Hypertension    Impaired fasting glucose    Low back pain    Melanoma in situ (HCC) 10/09/2003   left chin, MOHs   MI (myocardial infarction) (HCC) 1999   SCC (squamous cell carcinoma) 07/03/2014   in situ, behind left ear, cx3, cautery, 55fu   SCC (squamous cell carcinoma) 07/03/2014   well diff, left forearm, biopsy, cx1, cautery   SCC (squamous cell carcinoma) 07/20/2017   in situ, left upper arm,  cx3, 74fu   SCC (squamous cell carcinoma) 01/10/2019   in situ, left post shoulder, cx3, 50fu   SCC (squamous cell carcinoma) 11/22/2001   left forearm distal, left forearm, cx3, 65fu   SCC (squamous cell carcinoma) 10/09/2003   [place], left ear post, clear per st, right cheek clear   SCC (squamous cell carcinoma) 03/30/2004   in situ, left upper arm, cx3, 61fu   SCC (squamous cell carcinoma) 03/08/2005   in situ, right cheek, mid upper forehead, cx3, 34fu   SCC (squamous cell carcinoma) 06/08/2006   in situ, left shoulder, cx3, 70fu   SCC (squamous cell carcinoma) 05/05/2010   right inner wrist, biopsy   SCC (squamous cell carcinoma) 09/13/2013   in situ, right crown scalp, front scalp, biopsy   Thrush     Patient Active Problem List   Diagnosis Date Noted   Maxillary sinusitis 04/11/2023   Celiac artery stenosis (HCC) 02/02/2023   Chronic idiopathic constipation 12/21/2022   Osteoarthritis of knees, bilateral 04/14/2022   Irritable bowel syndrome 03/17/2022   Recurrent right lower quadrant abdominal pain 03/03/2022   Seasonal allergies 08/06/2021   COPD (chronic obstructive pulmonary disease) (HCC) 04/23/2021   BPH (benign prostatic hyperplasia) 04/23/2021   CAD (coronary artery disease) 04/23/2021   Hypertension  09/16/2016   Lumbar spondylosis 11/05/2014   GERD (gastroesophageal reflux disease) 01/19/2012   Hyperlipidemia 04/15/2008   AAA (abdominal aortic aneurysm) (HCC) 04/15/2008    Past Surgical History:  Procedure Laterality Date   BACK SURGERY     x 3   CARDIAC CATHETERIZATION     angioplasty   CATARACT EXTRACTION W/PHACO  03/20/2012   Procedure: CATARACT EXTRACTION PHACO AND INTRAOCULAR LENS PLACEMENT (IOC);  Surgeon: Susa Simmonds, MD;  Location: [facility];  Service: Ophthalmology;  Laterality: Right;  CDE:  8.45   CATARACT EXTRACTION W/PHACO Left 04/02/2013   Procedure: CATARACT EXTRACTION PHACO AND INTRAOCULAR LENS PLACEMENT (IOC);  Surgeon: Susa Simmonds, MD;   Location: [facility];  Service: Ophthalmology;  Laterality: Left;  CDE:  6.50   CHOLECYSTECTOMY  2000   COLONOSCOPY  2009   repeat 5 years   ESOPHAGOGASTRODUODENOSCOPY     HERNIA REPAIR Left    inguinal   INGUINAL HERNIA REPAIR Right 03/28/2020   Procedure: Right Inguinal Herniorrhaphy with Mesh;  Surgeon: Franky Macho, MD;  Location: [facility];  Service: General;  Laterality: Right;   LAPAROSCOPIC PARTIAL COLECTOMY N/A 06/11/2013   Procedure: LAPAROSCOPIC HAND ASSISTED PARTIAL COLECTOMY;  Surgeon: Dalia Heading, MD;  Location: [facility];  Service: General;  Laterality: N/A;   NASAL ENDOSCOPY WITH EPISTAXIS CONTROL Bilateral 02/11/2020   Procedure: NASAL ENDOSCOPY WITH EPISTAXIS CONTROL;  Surgeon: Newman Pies, MD;  Location: [facility];  Service: ENT;  Laterality: Bilateral;   right eye detached retina Bilateral    SPINAL FUSION  2016   YAG LASER APPLICATION Left 05/06/2014   Procedure: YAG LASER APPLICATION;  Surgeon: Susa Simmonds, MD;  Location: [facility];  Service: Ophthalmology;  Laterality: Left;       Home Medications    Prior to Admission medications   Medication Sig Start Date End Date Taking? Authorizing Provider  cyclobenzaprine (FLEXERIL) 5 MG tablet Take 1 tablet (5 mg total) by mouth 3 (three) times daily as needed for muscle spasms. Do not drink alcohol or drive while taking this medication.  May cause drowsiness. 04/16/23  Yes Particia Nearing, PA-C  Fluticasone-Umeclidin-Vilant (TRELEGY ELLIPTA) 100-62.5-25 MCG/ACT AEPB Inhale 1 puff into the lungs daily. 04/04/23   Tommie Sams, DO  predniSONE (DELTASONE) 20 MG tablet Take 2 tablets (40 mg total) by mouth daily with breakfast. 04/16/23  Yes Particia Nearing, PA-C  albuterol (PROVENTIL) (2.5 MG/3ML) 0.083% nebulizer solution Take 3 mLs (2.5 mg total) by nebulization every 6 (six) hours as needed for wheezing or shortness of breath. 09/10/22   Olalere, Adewale A, MD  albuterol (VENTOLIN HFA) 108 (90 Base)  MCG/ACT inhaler Inhale 1-2 puffs into the lungs every 6 (six) hours as needed for wheezing or shortness of breath. 09/08/22   Tomma Lightning, MD  albuterol (VENTOLIN HFA) 108 (90 Base) MCG/ACT inhaler Inhale 2 puffs into the lungs every 4 (four) hours as needed for wheezing or shortness of breath. 03/19/23   Durwin Glaze, MD  alfuzosin (UROXATRAL) 10 MG 24 hr tablet Take 1 tablet (10 mg total) by mouth daily with breakfast. 01/28/23   McKenzie, Mardene Celeste, MD  COMBIGAN 0.2-0.5 % ophthalmic solution Apply 1 drop to eye 2 (two) times daily. 10/08/20   [provider]  doxycycline (VIBRA-TABS) 100 MG tablet Take 1 tablet (100 mg total) by mouth 2 (two) times daily. Take with food and water. 04/11/23   Tommie Sams, DO  esomeprazole (NEXIUM) 20 MG capsule  Take 20 mg by mouth daily.    [provider]  fluticasone-salmeterol (ADVAIR HFA) 230-21 MCG/ACT inhaler Inhale 2 puffs into the lungs 2 (two) times daily. 03/31/23   Olalere, Onnie Boer A, MD  ipratropium (ATROVENT) 0.06 % nasal spray Place 2 sprays into both nostrils 4 (four) times daily as needed for rhinitis. 03/02/23   Tommie Sams, DO  loratadine (CLARITIN) 10 MG tablet Take 10 mg by mouth daily.    [provider]  LORazepam (ATIVAN) 0.5 MG tablet Take 1 tablet (0.5 mg total) by mouth at bedtime as needed for anxiety. 01/14/23   Tommie Sams, DO  lovastatin (MEVACOR) 40 MG tablet Take 1 tablet (40 mg total) by mouth daily. 04/01/23   Tommie Sams, DO  nitrofurantoin, macrocrystal-monohydrate, (MACROBID) 100 MG capsule Take 1 capsule (100 mg total) by mouth 2 (two) times daily. 03/29/23   Tommie Sams, DO  nitroGLYCERIN (NITROSTAT) 0.4 MG SL tablet Place 1 tablet (0.4 mg total) under the tongue every 5 (five) minutes as needed for chest pain. Patient taking differently: Place 0.4 mg under the tongue every 5 (five) minutes x 3 doses as needed for chest pain (if no relief after 3rd dose, proceed to ED or call 911). 02/26/19    Wendall Stade, MD  oxyCODONE-acetaminophen (PERCOCET/ROXICET) 5-325 MG tablet Take 1 tablet by mouth every 6 (six) hours as needed for severe pain (pain score 7-10). 04/18/23   Bethann Berkshire, MD  oxymetazoline (AFRIN) 0.05 % nasal spray Place 1 spray into both nostrils 2 (two) times daily.    [provider]  Spacer/Aero-Holding Rudean Curt Use as directed 02/17/22   Tomma Lightning, MD    Family History Family History  Problem Relation Age of Onset   Hypertension Mother    COPD Father    Cancer Brother        brain    Social History Social History   Tobacco Use   Smoking status: Former    Current packs/day: 0.00    Average packs/day: 1 pack/day for 35.0 years (35.0 ttl pk-yrs)    Types: Cigarettes    Start date: 03/27/1968    Quit date: 03/28/2003    Years since quitting: 20.0   Smokeless tobacco: Never  Vaping Use   Vaping status: Never Used  Substance Use Topics   Alcohol use: No    Alcohol/week: 0.0 standard drinks of alcohol   Drug use: No     Allergies   Bactrim [sulfamethoxazole-trimethoprim], Other, Beta adrenergic blockers, Lasix [furosemide], Penicillin g, Penicillin g sodium, Ciprofloxacin, Dexamethasone, Gabapentin, Methocarbamol, Neomycin, Penicillins, Sulfamethoxazole, and Tetracyclines & related   Review of Systems Review of Systems Per HPI  Physical Exam Triage Vital Signs ED Triage Vitals [04/16/23 1353]  Encounter Vitals Group     BP (!) 150/87     Systolic BP Percentile      Diastolic BP Percentile      Pulse Rate 77     Resp 16     Temp 97.6 F (36.4 C)     Temp Source Oral     SpO2 93 %     Weight      Height      Head Circumference      Peak Flow      Pain Score 8     Pain Loc      Pain Education      Exclude from Growth Chart    No data found.  Updated Vital Signs  BP (!) 150/87 (BP Location: Left Arm)   Pulse 77   Temp 97.6 F (36.4 C) (Oral)   Resp 16   SpO2 93%   Visual Acuity Right Eye Distance:    Left Eye Distance:   Bilateral Distance:    Right Eye Near:   Left Eye Near:    Bilateral Near:     Physical Exam Vitals and nursing note reviewed.  Constitutional:      Appearance: Normal appearance.  HENT:     Head: Atraumatic.  Eyes:     Extraocular Movements: Extraocular movements intact.     Conjunctiva/sclera: Conjunctivae normal.  Cardiovascular:     Rate and Rhythm: Normal rate and regular rhythm.  Pulmonary:     Effort: Pulmonary effort is normal.     Breath sounds: Normal breath sounds.  Musculoskeletal:        General: Tenderness present. No swelling, deformity or signs of injury. Normal range of motion.     Cervical back: Normal range of motion and neck supple.     Comments: Tender to palpation in the area of the right bicep and tricep, no appreciable edema, masses on palpation of this area.  Grip strength full and equal bilateral hands.  Range of motion intact to the right upper extremity  Skin:    General: Skin is warm and dry.     Findings: No bruising or erythema.  Neurological:     General: No focal deficit present.     Mental Status: He is oriented to person, place, and time.     Comments: B/l UEs neurovascularly intact  Psychiatric:        Mood and Affect: Mood normal.        Thought Content: Thought content normal.        Judgment: Judgment normal.      UC Treatments / Results  Labs (all labs ordered are listed, but only abnormal results are displayed) Labs Reviewed - No data to display  EKG   Radiology DG Humerus Right  Result Date: 04/18/2023 CLINICAL DATA:  Right arm pain. EXAM: RIGHT HUMERUS - 2+ VIEW COMPARISON:  None Available. FINDINGS: Degenerative changes in the glenohumeral joint. No evidence of acute fracture or dislocation. No focal bone lesion or bone destruction. Soft tissues are unremarkable. IMPRESSION: Degenerative changes of the right shoulder. No acute bony abnormalities. Electronically Signed   By: Burman Nieves M.D.    On: 04/18/2023 21:06   DG Cervical Spine Complete  Result Date: 04/18/2023 CLINICAL DATA:  Pain.  Right arm pain since Tuesday. EXAM: CERVICAL SPINE - COMPLETE 4+ VIEW COMPARISON:  None Available. FINDINGS: Normal alignment of the cervical spine and facet joints. No vertebral compression deformities. No focal bone lesion or bone destruction. Degenerative changes with disc space narrowing and endplate osteophyte formation throughout. Degenerative changes in the facet joints. Bilateral apical pleural thickening. IMPRESSION: Normal alignment. Moderate diffuse degenerative changes. No acute displaced fractures identified. Electronically Signed   By: Burman Nieves M.D.   On: 04/18/2023 21:05   DG Shoulder Right  Result Date: 04/18/2023 CLINICAL DATA:  Pain.  Right arm pain since Tuesday night. EXAM: RIGHT SHOULDER - 2+ VIEW COMPARISON:  None Available. FINDINGS: Degenerative changes in the acromioclavicular and glenohumeral joints. No evidence of acute fracture or dislocation of the right shoulder. No focal bone lesion or bone destruction. Soft tissues are unremarkable. IMPRESSION: Degenerative changes in the right shoulder. No acute bony abnormalities. Electronically Signed   By: Marisa Cyphers.D.  On: 04/18/2023 21:04    Procedures Procedures (including critical care time)  Medications Ordered in UC Medications - No data to display  Initial Impression / Assessment and Plan / UC Course  I have reviewed the triage vital signs and the nursing notes.  Pertinent labs & imaging results that were available during my care of the patient were reviewed by me and considered in my medical decision making (see chart for details).     Exam very reassuring today, low suspicion for bony injury so x-ray imaging deferred today with shared decision making.  Also low suspicion for DVT, neurovascularly intact with no edema, discoloration.  Suspect muscular versus tendinitis.  Treat with prednisone, Flexeril,  heat, massage, rest.  Return for worsening symptoms.  Final Clinical Impressions(s) / UC Diagnoses   Final diagnoses:  Right arm pain     Discharge Instructions      I suspect your pain to be related to muscular spasms or tendinitis.  I have sent in a course of prednisone as the over-the-counter pain relievers are not helping as well as a muscle relaxer to take 3 times daily as needed.  Continue your good home care with heat, massage, gentle stretches and follow-up with your primary care provider soon as possible.  Go to the emergency department if significantly worsening at any point.    ED Prescriptions     Medication Sig Dispense Auth. Provider   predniSONE (DELTASONE) 20 MG tablet Take 2 tablets (40 mg total) by mouth daily with breakfast. 10 tablet Particia Nearing, PA-C   cyclobenzaprine (FLEXERIL) 5 MG tablet Take 1 tablet (5 mg total) by mouth 3 (three) times daily as needed for muscle spasms. Do not drink alcohol or drive while taking this medication.  May cause drowsiness. 15 tablet Particia Nearing, [place]      PDMP not reviewed this encounter.   Particia Nearing, [place] 04/19/23 1606

## 2023-04-20 ENCOUNTER — Ambulatory Visit (INDEPENDENT_AMBULATORY_CARE_PROVIDER_SITE_OTHER): Payer: Medicare Other | Admitting: Orthopedic Surgery

## 2023-04-20 ENCOUNTER — Encounter: Payer: Self-pay | Admitting: Orthopedic Surgery

## 2023-04-20 VITALS — BP 154/91 | HR 85 | Ht 73.0 in | Wt 211.0 lb

## 2023-04-20 DIAGNOSIS — M25511 Pain in right shoulder: Secondary | ICD-10-CM

## 2023-04-20 NOTE — Patient Instructions (Signed)

## 2023-04-20 NOTE — Progress Notes (Signed)
Orthopaedic Clinic Return  Assessment: QUENTRELL RODEMAN is a 82 y.o. male with the following: Right arm pain; radicular pain, versus tendinitis   Plan: Acute onset of right shoulder pain, without specific injury.  Radiographs are negative.  He does have a history of similar type pains, years ago.  At the time, the pain was bilateral.  On physical exam, he has decent range of motion, is a decent strength in the right shoulder.  Tenderness in the area of the deltoid tuberosity.  No recreation of pain or radicular pain with range of motion of the cervical spine.  Unclear what is causing his symptoms.  Could attempt a steroid injection in, which could improve his symptoms.  This was completed in clinic today.  Would like see him back in 2 weeks for repeat evaluation.  Procedure note injection - Right shoulder    Verbal consent was obtained to inject the right shoulder, subacromial space Timeout was completed to confirm the site of injection.   The skin was prepped with alcohol and ethyl chloride was sprayed at the injection site.  A 21-gauge needle was used to inject 40 mg of Depo-Medrol and 1% lidocaine (4 cc) into the subacromial space of the right shoulder using a posterolateral approach.  There were no complications.  A sterile bandage was applied.    Follow-up: Return in about 2 weeks (around 05/04/2023).   Subjective:  Chief Complaint  Patient presents with   Arm Pain    R upper arm pain for 1 wks getting worse. States the pain does move around but worse down the back of arm to his elbow.     History of Present Illness: ABAD EDGE is a 82 y.o. male who returns to clinic for evaluation of right shoulder pain.  He is right-hand dominant.  He said pain in the lateral aspect of the upper arm, for the past week.  No specific injury.  He has had this pain in the past, but the pains at that time were bilateral.  Gradually resolved, but has improved.  No prior injury to his right shoulder.   No injury or pain in the neck.  No numbness or tingling.  Muscle relaxers and NSAIDs are not been effective.  She has tried some prednisone without sustained relief.  Review of Systems: No fevers or chills No numbness or tingling No chest pain No shortness of breath No bowel or bladder dysfunction No GI distress No headaches   Objective: BP (!) 154/91   Pulse 85   Ht 6\' 1"  (1.854 m)   Wt 211 lb (95.7 kg)   BMI 27.84 kg/m   Physical Exam:  Alert and oriented, no acute distress  No deformity.  No swelling.  No bruising.  Tenderness around the deltoid tuberosity.  Restrictions forward flexion, abduction and external rotation due to pain.  He has good strength overall.  Fingers are warm and well-perfused.  IMAGING: I personally ordered and reviewed the following images:   X-rays from the emergency department were negative for acute injury.  Oliver Barre, MD 04/20/2023 2:23 PM

## 2023-04-26 ENCOUNTER — Encounter: Payer: Self-pay | Admitting: Orthopedic Surgery

## 2023-04-26 ENCOUNTER — Ambulatory Visit (INDEPENDENT_AMBULATORY_CARE_PROVIDER_SITE_OTHER): Payer: Medicare Other | Admitting: Orthopedic Surgery

## 2023-04-26 DIAGNOSIS — M5412 Radiculopathy, cervical region: Secondary | ICD-10-CM

## 2023-04-26 DIAGNOSIS — M25511 Pain in right shoulder: Secondary | ICD-10-CM

## 2023-04-26 MED ORDER — PREDNISONE 10 MG (21) PO TBPK: ORAL_TABLET | ORAL | 0 refills | Status: DC

## 2023-04-26 NOTE — Progress Notes (Signed)
Orthopaedic Clinic Return  Assessment: [name] [name] is a 82 y.o. male with the following: Right arm radicular pain   Plan: Mr. Rieken has aching pains rating into the right arm.  Recent subacromial steroid injection has not improved his symptoms.  He does deny pain in his neck, but has aching pains into the posterior right shoulder, into the upper arm and occasionally into the forearm.  He notes decrease in dexterity of his right hand.  He states his balance is poor.  Occasional numbness and tingling in the right hand.  I have provided him with some prednisone.  Given his constellation of symptoms, I would recommend a cervical spine MRI.  Order will be placed.  I will see him in follow-up.  He if he has issues before then, he will contact clinic.   Follow-up: Return for After MRI.   Subjective:  Chief Complaint  Patient presents with   Shoulder Pain    R shoulder pain no better but also noticing new radiation and loss of ROM since injection.     History of Present Illness: [name] [name] is a 82 y.o. male who returns to clinic for evaluation of right arm pain.  I saw him in clinic a week ago.  At that time, he had pain in the upper arm, with occasional pains radiating into the forearm.  He had some tenderness to palpation.  We completed a right shoulder steroid injection, which has not improved his symptoms.  In fact, his pain is worse.  He notes a deep aching type pain in the right arm, with more radiating pains into the forearm.  In addition, he is having pain into the right shoulder blade.  No pain in his neck.  He states the pain will wake him up at night.  He has had to take some oxycodone, which was supplied by the emergency department.    Review of Systems: No fevers or chills + numbness or tingling No chest pain No shortness of breath No bowel or bladder dysfunction No GI distress No headaches   Objective: There were no vitals taken for this visit.  Physical  Exam:  Alert and oriented, no acute distress  Restricted shoulder and neck range of motion.  Mild tenderness to palpation within the trapezius.  No specific tenderness in the upper arm, compared to last week.  Mild weakness in grip strength.  Negative Hoffmann's bilaterally.  IMAGING: I personally ordered and reviewed the following images:  Cervical spine x-rays were previously obtained.  Diffuse degenerative changes overall.  Oliver Barre, MD 04/26/2023 1:09 PM

## 2023-04-28 ENCOUNTER — Ambulatory Visit (INDEPENDENT_AMBULATORY_CARE_PROVIDER_SITE_OTHER): Payer: Medicare Other | Admitting: Family Medicine

## 2023-04-28 DIAGNOSIS — I10 Essential (primary) hypertension: Secondary | ICD-10-CM | POA: Diagnosis not present

## 2023-04-28 DIAGNOSIS — E785 Hyperlipidemia, unspecified: Secondary | ICD-10-CM

## 2023-04-28 DIAGNOSIS — J449 Chronic obstructive pulmonary disease, unspecified: Secondary | ICD-10-CM | POA: Diagnosis not present

## 2023-04-28 NOTE — Assessment & Plan Note (Signed)
Lipid panel today.  Continue statin. 

## 2023-04-28 NOTE — Assessment & Plan Note (Signed)
Stable

## 2023-04-28 NOTE — Assessment & Plan Note (Signed)
Stable at this time.  Continue Advair.

## 2023-04-28 NOTE — Patient Instructions (Signed)
Continue your medications. ? ?Lab today. ? ?Follow up in 6 months. ? ?Take care ? ?Dr. Adriana Simas  ?

## 2023-04-28 NOTE — Progress Notes (Signed)
Subjective:  Patient ID: [name] [name], male    DOB: [birth year]  Age: 82 y.o. MRN: [id number]  CC: Follow-up   HPI:  82 year old male with an extensive past medical history including abdominal aortic aneurysm, CAD, hypertension, COPD, GERD, osteoarthritis, BPH, hyperlipidemia presents for follow-up.  Recent antibiotic use with doxycycline caused adverse side effects (lip swelling and oral lesions).  Definitely cannot tolerate doxycycline in the future.  He states that he still having maxillary sinus tenderness to palpation but overall is doing okay.  COPD stable.  He is on Advair.  Has follow-up scheduled with pulmonology.  Has recently seen orthopedics regarding right arm pain/shoulder pain.  Concern for cervical radiculopathy.  Currently on corticosteroids.  MRI to be done on Monday.  Hyperlipidemia has been at goal.  Needs lipid panel.  Currently on lovastatin.  Hypertension stable.  Patient Active Problem List   Diagnosis Date Noted   Celiac artery stenosis (HCC) 02/02/2023   Chronic idiopathic constipation 12/21/2022   Osteoarthritis of knees, bilateral 04/14/2022   Irritable bowel syndrome 03/17/2022   Recurrent right lower quadrant abdominal pain 03/03/2022   Seasonal allergies 08/06/2021   COPD (chronic obstructive pulmonary disease) (HCC) 04/23/2021   BPH (benign prostatic hyperplasia) 04/23/2021   CAD (coronary artery disease) 04/23/2021   Hypertension 09/16/2016   Lumbar spondylosis 11/05/2014   GERD (gastroesophageal reflux disease) 01/19/2012   Hyperlipidemia 04/15/2008   AAA (abdominal aortic aneurysm) (HCC) 04/15/2008    Social Hx   Social History   Socioeconomic History   Marital status: Widowed    Spouse name: Not on file   Number of children: Not on file   Years of education: Not on file   Highest education level: Not on file  Occupational History   Not on file  Tobacco Use   Smoking status: Former    Current packs/day: 0.00    Average  packs/day: 1 pack/day for 35.0 years (35.0 ttl pk-yrs)    Types: Cigarettes    Start date: 03/27/1968    Quit date: 03/28/2003    Years since quitting: 20.0   Smokeless tobacco: Never  Vaping Use   Vaping status: Never Used  Substance and Sexual Activity   Alcohol use: No    Alcohol/week: 0.0 standard drinks of alcohol   Drug use: No   Sexual activity: Not Currently    Birth control/protection: None  Other Topics Concern   Not on file  Social History Narrative   Not on file   Social Drivers of Health   Financial Resource Strain: Low Risk  (03/18/2022)   Overall Financial Resource Strain (CARDIA)    Difficulty of Paying Living Expenses: Not very hard  Food Insecurity: No Food Insecurity (03/18/2022)   Hunger Vital Sign    Worried About Running Out of Food in the Last Year: Never true    Ran Out of Food in the Last Year: Never true  Transportation Needs: No Transportation Needs (03/18/2022)   PRAPARE - Administrator, Civil Service (Medical): No    Lack of Transportation (Non-Medical): No  Physical Activity: Insufficiently Active (03/18/2022)   Exercise Vital Sign    Days of Exercise per Week: 2 days    Minutes of Exercise per Session: 20 min  Stress: No Stress Concern Present (03/18/2022)   [employer] of Occupational Health - Occupational Stress Questionnaire    Feeling of Stress : Not at all  Social Connections: Socially Isolated (03/18/2022)   Social Connection and Isolation Panel [NHANES]  Frequency of Communication with Friends and Family: More than three times a week    Frequency of Social Gatherings with Friends and Family: Three times a week    Attends Religious Services: Never    Active Member of Clubs or Organizations: No    Attends Banker Meetings: Never    Marital Status: Widowed    Review of Systems Per HPI  Objective:  BP 132/80   Pulse 80   Temp 97.7 F (36.5 C)   Ht 6\' 1"  (1.854 m)   Wt 208 lb 3.2 oz (94.4 kg)   SpO2  97%   BMI 27.47 kg/m      04/28/2023    2:16 PM 04/28/2023    1:46 PM 04/20/2023   11:07 AM  BP/Weight  Systolic BP 132 153 154  Diastolic BP 80 90 91  Wt. (Lbs)  208.2 211  BMI  27.47 kg/m2 27.84 kg/m2    Physical Exam Vitals and nursing note reviewed.  Constitutional:      General: He is not in acute distress.    Appearance: Normal appearance.  Cardiovascular:     Rate and Rhythm: Normal rate and regular rhythm.  Pulmonary:     Effort: Pulmonary effort is normal.     Breath sounds: Normal breath sounds. No wheezing, rhonchi or rales.  Neurological:     Mental Status: He is alert.  Psychiatric:        Mood and Affect: Mood normal.        Behavior: Behavior normal.     Lab Results  Component Value Date   WBC 9.0 03/19/2023   HGB 13.5 03/19/2023   HCT 41.4 03/19/2023   PLT 244 03/19/2023   GLUCOSE 145 (H) 03/19/2023   CHOL 124 04/23/2021   TRIG 109 04/23/2021   HDL 39 (L) 04/23/2021   LDLCALC 65 04/23/2021   ALT 16 06/29/2022   AST 18 06/29/2022   NA 136 03/19/2023   K 3.7 03/19/2023   CL 100 03/19/2023   CREATININE 0.92 03/19/2023   BUN 11 03/19/2023   CO2 29 03/19/2023   TSH 1.230 02/14/2019   PSA 1.80 07/26/2014   HGBA1C 6.1 (H) 04/23/2021     Assessment & Plan:   Problem List Items Addressed This Visit       Cardiovascular and Mediastinum   Hypertension   Stable.        Respiratory   COPD (chronic obstructive pulmonary disease) (HCC)   Stable at this time.  Continue Advair.        Other   Hyperlipidemia   Lipid panel today.  Continue statin.      Relevant Orders   Lipid panel    Follow-up: 6 months  Yalissa Fink Adriana Simas DO [facility]

## 2023-04-29 LAB — LIPID PANEL
Chol/HDL Ratio: 2.7 {ratio} (ref 0.0–5.0)
Cholesterol, Total: 167 mg/dL (ref 100–199)
HDL: 62 mg/dL (ref >39)
LDL Chol Calc (NIH): 87 mg/dL (ref 0–99)
Triglycerides: 99 mg/dL (ref 0–149)
VLDL Cholesterol Cal: 18 mg/dL (ref 5–40)

## 2023-05-01 ENCOUNTER — Other Ambulatory Visit: Payer: Self-pay | Admitting: Family Medicine

## 2023-05-01 DIAGNOSIS — G4700 Insomnia, unspecified: Secondary | ICD-10-CM

## 2023-05-02 ENCOUNTER — Ambulatory Visit (HOSPITAL_COMMUNITY)
Admission: RE | Admit: 2023-05-02 | Discharge: 2023-05-02 | Disposition: A | Discharge status: Home / Self Care | Payer: Medicare Other | Source: Ambulatory Visit | Attending: Orthopedic Surgery | Admitting: Orthopedic Surgery

## 2023-05-02 DIAGNOSIS — M5412 Radiculopathy, cervical region: Secondary | ICD-10-CM | POA: Insufficient documentation

## 2023-05-02 DIAGNOSIS — M25511 Pain in right shoulder: Secondary | ICD-10-CM | POA: Diagnosis not present

## 2023-05-02 DIAGNOSIS — M4722 Other spondylosis with radiculopathy, cervical region: Secondary | ICD-10-CM | POA: Diagnosis not present

## 2023-05-02 DIAGNOSIS — M4721 Other spondylosis with radiculopathy, occipito-atlanto-axial region: Secondary | ICD-10-CM | POA: Diagnosis not present

## 2023-05-02 DIAGNOSIS — M4723 Other spondylosis with radiculopathy, cervicothoracic region: Secondary | ICD-10-CM | POA: Diagnosis not present

## 2023-05-02 DIAGNOSIS — M4802 Spinal stenosis, cervical region: Secondary | ICD-10-CM | POA: Diagnosis not present

## 2023-05-04 ENCOUNTER — Ambulatory Visit: Payer: Medicare Other | Admitting: Orthopedic Surgery

## 2023-05-04 NOTE — Progress Notes (Unsigned)
[name] [name], male    DOB: 1941-02-22    MRN: [id number]   Brief patient profile:  89  yowm  quit smoking 2004  with hx  bad asthma as child former [place] pt self-referred back to pulmonary clinic in [facility]  05/05/2023  for copd  with GOLD 3 (barely) criteria but nl DLCO   11/05/19      History of Present Illness  05/05/2023  Pulmonary/ 1st office eval/ [name] / [place] [facility]  advair 250 Chief Complaint  Patient presents with   Establish Care    Switch from [facility]   Shortness of Breath  Dyspnea:  pushing mower x / walks a bit slower than others Cough: dark mucus x one day /also hoarse  Sleep: bed is flat and 2 pillows s resp cc  SABA hfa use: 2-3 x per week but neb twice daily as maint  02 NWG:NFAO   Prednisone helps / on it at initial eval due to R shouder pain  No obvious day to day or daytime pattern/variability or assoc  mucus plugs or hemoptysis or cp or chest tightness, subjective wheeze or overt sinus or hb symptoms.    Also denies any obvious fluctuation of symptoms with weather or environmental changes or other aggravating or alleviating factors except as outlined above   No unusual exposure hx or h/o childhood pna  or knowledge of premature birth.  Current Allergies, Complete Past Medical History, Past Surgical History, Family History, and Social History were reviewed in [employer] record.  ROS  The following are not active complaints unless bolded Hoarseness, sore throat, dysphagia, dental problems, itching, sneezing,  nasal congestion or discharge of excess mucus or purulent secretions, ear ache,   fever, chills, sweats, unintended wt loss or wt gain, classically pleuritic or exertional cp,  orthopnea pnd or arm/hand swelling  or leg swelling, presyncope, palpitations, abdominal pain, anorexia, nausea, vomiting, diarrhea  or change in bowel habits or change in bladder habits, change in stools or change in urine, dysuria, hematuria,   rash, arthralgias, visual complaints, headache, numbness, weakness or ataxia or problems with walking or coordination,  change in mood or  memory.            Outpatient Medications Prior to Visit  Medication Sig Dispense Refill   acetaminophen (TYLENOL) 500 MG tablet Take 500 mg by mouth every 6 (six) hours as needed.     albuterol (PROVENTIL) (2.5 MG/3ML) 0.083% nebulizer solution Take 3 mLs (2.5 mg total) by nebulization every 6 (six) hours as needed for wheezing or shortness of breath. 360 mL 5   albuterol (VENTOLIN HFA) 108 (90 Base) MCG/ACT inhaler Inhale 2 puffs into the lungs every 4 (four) hours as needed for wheezing or shortness of breath. 18 each 0   alfuzosin (UROXATRAL) 10 MG 24 hr tablet Take 1 tablet (10 mg total) by mouth daily with breakfast. 90 tablet 3   cetirizine (ZYRTEC) 10 MG tablet Take 10 mg by mouth daily.     COMBIGAN 0.2-0.5 % ophthalmic solution Apply 1 drop to eye 2 (two) times daily.     esomeprazole (NEXIUM) 20 MG capsule Take 20 mg by mouth daily.     guaiFENesin (MUCINEX) 600 MG 12 hr tablet Take by mouth 2 (two) times daily.     LORazepam (ATIVAN) 0.5 MG tablet TAKE 1 TABLET BY MOUTH ONCE DAILY AT BEDTIME AS NEEDED 30 tablet 3   lovastatin (MEVACOR) 40 MG tablet Take 1 tablet (40  mg total) by mouth daily. 90 tablet 0   oxymetazoline (AFRIN) 0.05 % nasal spray Place 1 spray into both nostrils 2 (two) times daily.     predniSONE (DELTASONE) 10 MG tablet Take 10 mg by mouth as directed.     Spacer/Aero-Holding Rudean Curt Use as directed 1 each 0   fluticasone-salmeterol (ADVAIR HFA) 230-21 MCG/ACT inhaler Inhale 2 puffs into the lungs 2 (two) times daily. 12 g 5   cyclobenzaprine (FLEXERIL) 5 MG tablet Take 1 tablet (5 mg total) by mouth 3 (three) times daily as needed for muscle spasms. Do not drink alcohol or drive while taking this medication.  May cause drowsiness. 15 tablet 0   loratadine (CLARITIN) 10 MG tablet Take 10 mg by mouth daily.      oxyCODONE-acetaminophen (PERCOCET/ROXICET) 5-325 MG tablet Take 1 tablet by mouth every 6 (six) hours as needed for severe pain (pain score 7-10). 20 tablet 0   predniSONE (STERAPRED UNI-PAK 21 TAB) 10 MG (21) TBPK tablet 10 mg DS 12 as directed 48 tablet 0   No facility-administered medications prior to visit.    Past Medical History:  Diagnosis Date   AAA (abdominal aortic aneurysm) (HCC)    needs yearly ultrasound   Allergy    Anemia    Arthritis    Asthma    BCC (basal cell carcinoma) 08/18/1989   left shoulder blad, upper right arm, left arm beyond elbow, c&d   BCC (basal cell carcinoma) 01/31/1992   Posterior neck, curetx3, 25fu   BCC (basal cell carcinoma) 11/22/2001   mid forehead, cx3, excision, right forearm, cx3, 66fu   BCC (basal cell carcinoma) 10/09/2003   mid forehead, MOHs   BCC (basal cell carcinoma) 08/15/2008   upper left back, biopsy   BPH (benign prostatic hyperplasia)    CAD (coronary artery disease)    Cancer (HCC)    skin cancer   COPD (chronic obstructive pulmonary disease) (HCC)    Dysrhythmia    pt. states it can be fast at times   GERD (gastroesophageal reflux disease)    Glaucoma    History of acute pancreatitis 12/21/2022   HOH (hard of hearing)    Hypercholesterolemia    Hypertension    Impaired fasting glucose    Low back pain    Melanoma in situ (HCC) 10/09/2003   left chin, MOHs   MI (myocardial infarction) (HCC) 1999   SCC (squamous cell carcinoma) 07/03/2014   in situ, behind left ear, cx3, cautery, 49fu   SCC (squamous cell carcinoma) 07/03/2014   well diff, left forearm, biopsy, cx1, cautery   SCC (squamous cell carcinoma) 07/20/2017   in situ, left upper arm, cx3, 41fu   SCC (squamous cell carcinoma) 01/10/2019   in situ, left post shoulder, cx3, 22fu   SCC (squamous cell carcinoma) 11/22/2001   left forearm distal, left forearm, cx3, 58fu   SCC (squamous cell carcinoma) 10/09/2003   [place], left ear post, clear per st, right cheek  clear   SCC (squamous cell carcinoma) 03/30/2004   in situ, left upper arm, cx3, 7fu   SCC (squamous cell carcinoma) 03/08/2005   in situ, right cheek, mid upper forehead, cx3, 65fu   SCC (squamous cell carcinoma) 06/08/2006   in situ, left shoulder, cx3, 63fu   SCC (squamous cell carcinoma) 05/05/2010   right inner wrist, biopsy   SCC (squamous cell carcinoma) 09/13/2013   in situ, right crown scalp, front scalp, biopsy   Thrush  Objective:     BP 125/85   Pulse 87   Ht 6\' 1"  (1.854 m)   Wt 205 lb (93 kg)   SpO2 96%   BMI 27.05 kg/m   SpO2: 96 % RA  Pleasant hoarse amb wm nad    HEENT : Oropharynx  clear   Nasal turbinates nl    NECK :  without  apparent JVD/ palpable Nodes/TM    LUNGS: no acc muscle use,  Min barrel  contour chest wall with bilateral  slightly decreased bs s audible wheeze and  without cough on insp or exp maneuvers and min  Hyperresonant  to  percussion bilaterally    CV:  RRR  no s3 or murmur or increase in P2, and no edema   ABD:  soft and nontender with pos end  insp Hoover's  in the supine position.  No bruits or organomegaly appreciated   MS:  Nl gait/ ext warm without deformities Or obvious joint restrictions  calf tenderness, cyanosis or clubbing     SKIN: warm and dry without lesions    NEURO:  alert, approp, nl sensorium with  no motor or cerebellar deficits apparent.            Assessment   Chronic asthma, severe persistent, uncomplicated Quit smoking 16109 -  asthma onset as child  - PFT's  11/05/19  FEV1 1.72 (47 % ) ratio 0.61  p 8 % improvement from saba p saba  prior to study with DLCO  32.71 (114 %)   and FV curve classically concave    - 05/05/2023  After extensive coaching inhaler device,  effectiveness =    75% vs baseline 25 % try d/c advair (hoaresness) and breyna 160 2bid (trained on breztri to use one bid sample)   If this is asthma and not copd (which I strongly suspect by cxr and nl dlco) then he is way over  using saba with typical advair voice changes and very poor baseline hfa so should do along better on hfa lama/ics  and approp saba  Alos rx zpak prn purulent sputum  F/u 3 m, sooner if needed          Each maintenance medication was reviewed in detail including emphasizing most importantly the difference between maintenance and prns and under what circumstances the prns are to be triggered using an action plan format where appropriate.  Total time for H and P, chart review, counseling, reviewing hfa/ neb device(s) and generating customized AVS unique to this office visit / same day charting > 40 min pt new to me          Sandrea Hughs, MD 05/05/2023

## 2023-05-05 ENCOUNTER — Ambulatory Visit: Payer: Medicare Other | Admitting: Internal Medicine

## 2023-05-05 ENCOUNTER — Encounter: Payer: Self-pay | Admitting: Internal Medicine

## 2023-05-05 VITALS — BP 125/85 | HR 87 | Ht 73.0 in | Wt 205.0 lb

## 2023-05-05 DIAGNOSIS — J4550 Severe persistent asthma, uncomplicated: Secondary | ICD-10-CM

## 2023-05-05 DIAGNOSIS — J449 Chronic obstructive pulmonary disease, unspecified: Secondary | ICD-10-CM | POA: Insufficient documentation

## 2023-05-05 MED ORDER — AZITHROMYCIN 250 MG PO TABS: ORAL_TABLET | ORAL | 5 refills | Status: DC

## 2023-05-05 MED ORDER — BUDESONIDE-FORMOTEROL FUMARATE 160-4.5 MCG/ACT IN AERO: INHALATION_SPRAY | RESPIRATORY_TRACT | 12 refills | Status: DC

## 2023-05-05 NOTE — Assessment & Plan Note (Addendum)
Quit smoking 62130 -  asthma onset as child  - PFT's  11/05/19  FEV1 1.72 (47 % ) ratio 0.61  p 8 % improvement from saba p saba  prior to study with DLCO  32.71 (114 %)   and FV curve classically concave    - 05/05/2023  After extensive coaching inhaler device,  effectiveness =    75% vs baseline 25 % try d/c advair (hoaresness) and breyna 160 2bid (trained on breztri to use one bid sample)   If this is asthma and not copd (which I strongly suspect by cxr and nl dlco) then he is way over using saba with typical advair voice changes and very poor baseline hfa so should do along better on hfa lama/ics  and approp saba  Alos rx zpak prn purulent sputum  F/u 3 m, sooner if needed          Each maintenance medication was reviewed in detail including emphasizing most importantly the difference between maintenance and prns and under what circumstances the prns are to be triggered using an action plan format where appropriate.  Total time for H and P, chart review, counseling, reviewing hfa/ neb device(s) and generating customized AVS unique to this office visit / same day charting > 40 min pt new to me

## 2023-05-05 NOTE — Patient Instructions (Addendum)
If mucus stays nasty > Take Zpak (refillable)   Stop Advair   Plan A = Automatic = Always=    Breyna 160  (or breztri x one) x 2 puffs 1st thing in am and 12 hours later   Work on inhaler technique:  relax and gently blow all the way out then take a nice smooth full deep breath back in, triggering the inhaler at same time you start breathing in.  Hold breath in for at least  5 seconds if you can. Blow out breyna and symbicort thru nose. Rinse and gargle with water when done.  If mouth or throat bother you at all,  try brushing teeth/gums/tongue with arm and hammer toothpaste/ make a slurry and gargle and spit out.   >>>  Remember how golfers warm up by taking practice swings - do this with an empty inhaler   Plan B = Backup (to supplement plan A, not to replace it) Only use your albuterol inhaler as a rescue medication to be used if you can't catch your breath by resting or doing a relaxed purse lip breathing pattern.  - The less you use it, the better it will work when you need it. - Ok to use the inhaler up to 2 puffs  every 4 hours if you must but call for appointment if use goes up over your usual need - Don't leave home without it !!  (think of it like the spare tire for your car)    Plan C = Crisis (instead of Plan B but only if Plan B stops working) - only use your albuterol nebulizer if you first try Plan B and it fails to help > ok to use the nebulizer up to every 4 hours but if start needing it regularly call for immediate appointment      Please schedule a follow up visit in 3 months but call sooner if needed

## 2023-05-09 ENCOUNTER — Telehealth: Payer: Self-pay | Admitting: Internal Medicine

## 2023-05-09 DIAGNOSIS — J441 Chronic obstructive pulmonary disease with (acute) exacerbation: Secondary | ICD-10-CM

## 2023-05-09 MED ORDER — NYSTATIN 100000 UNIT/ML MT SUSP: OROMUCOSAL | 0 refills | Status: DC

## 2023-05-09 MED ORDER — FLUTICASONE-SALMETEROL 230-21 MCG/ACT IN AERO: 2.0000 | INHALATION_SPRAY | Freq: Two times a day (BID) | RESPIRATORY_TRACT | Status: DC

## 2023-05-09 NOTE — Telephone Encounter (Signed)
Rx sent to pharmacy. Patient aware. Nothing further needed at this time.

## 2023-05-09 NOTE — Telephone Encounter (Signed)
Ok to send in Nystatin s/s QID for thrush symptoms

## 2023-05-09 NOTE — Telephone Encounter (Signed)
Patient was here on Friday 05/05/23 to see Dr. [name].  His Albuterol inhaler was discontinued and he was started on Breyna----patient states this inhaler has irritated his mouth and he wants to start using the Albuterol again.  He is also wondering if Dr. [name] can call him in something for his mouth irritation.  Patient call back is [phone]----[facility]  [phone]

## 2023-05-09 NOTE — Telephone Encounter (Signed)
Spoke with patient and he states he has developed possible thrush from switching inhalers, Wert took him off Advair and gave him Breztri sample. Patient would like to go back to Advair as he did not have this issue with that inhaler; he does not need refills on this rx. Patient reports white patches on tongue, and sensitivity/irritation to tongue and roof of mouth.   Please advise on rx for thrush. Thank you!

## 2023-05-12 ENCOUNTER — Telehealth: Payer: Medicare Other | Admitting: Physician Assistant

## 2023-05-12 DIAGNOSIS — R300 Dysuria: Secondary | ICD-10-CM

## 2023-05-12 DIAGNOSIS — M549 Dorsalgia, unspecified: Secondary | ICD-10-CM

## 2023-05-12 DIAGNOSIS — R6883 Chills (without fever): Secondary | ICD-10-CM

## 2023-05-12 NOTE — Progress Notes (Signed)
Based on what you shared with me, I feel your condition warrants further evaluation as soon as possible at an Emergency department.    NOTE: There will be NO CHARGE for this eVisit   If you are having a true medical emergency please call 911.      Emergency Department-[facility]  Get Driving Directions  [phone]  [address]  [place], [place] [address]  Open 24/7/365      [place] [facility] [address]  Get Driving Directions  [address]  [place], [place] [address]  Open 24/7/365    Emergency Department- [facility]  Get Driving Directions  [phone]  [address]  [place], [place] [address]  Open 24/7/365      [facility]  Get Driving Directions  [phone]  [address]  [place], [place] [address]  Open 24/7/365    [place]  Emergency Department- [facility]  Get Driving Directions  [phone]  [address]  [place], [place] [address]  Open 24/7/365    [place]  Emergency Department- [facility] [place]  Get Driving Directions  [address]  [place], [place] [address]  Open 24/7/365    [place]  Emergency Department- [facility]  Get Driving Directions  [phone]  [address]  [place], [place] [address]  Open 24/7/365    

## 2023-05-13 ENCOUNTER — Encounter: Payer: Self-pay | Admitting: Orthopedic Surgery

## 2023-05-13 ENCOUNTER — Ambulatory Visit (INDEPENDENT_AMBULATORY_CARE_PROVIDER_SITE_OTHER): Payer: Medicare Other | Admitting: Orthopedic Surgery

## 2023-05-13 VITALS — Ht 73.0 in | Wt 205.5 lb

## 2023-05-13 DIAGNOSIS — M4802 Spinal stenosis, cervical region: Secondary | ICD-10-CM

## 2023-05-13 NOTE — Patient Instructions (Addendum)
We will refer you to Dr. Conchita Paris at [facility]  [phone]  If you have issues getting scheduled, please contact the clinic at the above number.

## 2023-05-13 NOTE — Progress Notes (Signed)
Orthopaedic Clinic Return  Assessment: [name] [name] is a 82 y.o. male with the following: Right arm radicular pain   Plan: Mr. Veneman continues to have some radiating pains into the right arm.  More concerning is the recent loss of function and dexterity in the right hand.  He notes some weakness.  Even mundane tasks are more difficult for him.  We reviewed the MRI today which demonstrates some central, as well as foraminal stenosis at multiple levels.  He also reports issues with his balance, handwriting, as well as small objects such as buttons.  He has previously had a lumbar spine fusion, which was completed by Dr. Conchita Paris.  I have recommended evaluation by spine surgeon, and he would like to return to see his prior surgery.  We will place referral.  He can contact their clinic in a couple of days, in order to schedule an appointment.   Follow-up: Return if symptoms worsen or fail to improve.   Subjective:  Chief Complaint  Patient presents with   Shoulder Pain    R/ shoulder is better. Here to go over MRI. My hand doesn't seem to work right.    History of Present Illness: [name] [name] is a 82 y.o. male who returns to clinic for evaluation of right arm pain.  I have seen him several times for this complaint.  He continues to have some pain.  Recently, he notes that he has had some weakness in the right hand.  He states he is unable to use his fork, as he has done previously.  He notes a loss of dexterity, and some weakness in the right arm.  He has obtained an MRI, and is here to discuss the findings.   Review of Systems: No fevers or chills + numbness or tingling No chest pain No shortness of breath No bowel or bladder dysfunction No GI distress No headaches   Objective: Ht 6\' 1"  (1.854 m)   Wt 205 lb 8 oz (93.2 kg)   BMI 27.11 kg/m   Physical Exam:  Alert and oriented, no acute distress  Weakness in triceps strength testing.  Mild weakness in grip strength.    Otherwise, strength is roughly the same.   Negative Hoffmann's bilaterally.  IMAGING: I personally ordered and reviewed the following images:  Cervical spine MRI  IMPRESSION: 1. Multilevel degenerative changes of the cervical spine, most pronounced at C4-C5 where there is moderate spinal canal narrowing and moderate to severe bilateral neuroforaminal narrowing (left greater than right). 2. Additional degenerative changes as above, including severe right neural foraminal narrowing at C6-C7.     Oliver Barre, MD 05/13/2023 11:58 AM

## 2023-05-13 NOTE — Progress Notes (Deleted)
Patient ID: [name] [name], male   DOB: February 13, 1941, 82 y.o.   MRN: [id number]      82 y.o. history of AAA, CAD pancreatitis PCI RCA 1999 with low risk myovue 2018. Clinically stable Intolerant to beta blockers with diarrhea . AAA 4.6 cm by [place] 05/12/20 but now 5.1 cm by [place] 12/30/21 Has seen Dr Arbie Cookey and threshold for EVAR 5.5 cm   Wife passed a few  years ago of Pancreatic Cancer and only lasted 6 weeks form diagnosis Has daughter in [place]    Had idiopathic pancreatitis 2022  Not a drinker has had GB removed Seen by Dr Dulce Sellar no etiology noted  No angina or cardiac complaints Former smoker quit in 2014 Doing cardiopulmonary rehab 2x/ week Has seen Dr Davonna Belling in past Gold stage 3 COPD Dr [name] feels he has more asthma than COPD Using B2 inhaler and advair   He is active with some dyspnea from COPD Age and pulmonary status make him a poor candidate for open AAA repair AAA 5.3 cm on CTA 10/18;23  Dr Arbie Cookey "on fence" about impending need for intervention and suggested seeing him again in August  Seen by Dr Chestine Spore 01/04/23 measured 5.1 cm on CT and again observation recommended   No abdominal pain or issues with AAA  Myovue 05/24/22 with fixed inferior defect no ischemia EF 45%  Some right arm radicular pain with cervical stenosis and prior lumbar fusion Dr Conchita Paris.   ***  ROS: Denies fever, malais, weight loss, blurry vision, decreased visual acuity, cough, sputum, SOB, hemoptysis, pleuritic pain, palpitaitons, heartburn, abdominal pain, melena, lower extremity edema, claudication, or rash.  All other systems reviewed and negative  General: There were no vitals taken for this visit.  Affect appropriate Healthy:  appears stated age HEENT: hard of hearing Aides in place  Neck supple with no adenopathy JVP normal no bruits no thyromegaly Lungs Mild exp wheezing and good diaphragmatic motion Heart:  S1/S2 no murmur, no rub, gallop or click PMI normal Abdomen: benighn, BS positve, no  tenderness, AAA palpable not tender  Prior colon surgery for polyp and right hernia repair  no bruit.  No HSM or HJR Post right hernia surgery repair but persistent  Umbilical hernia  Distal pulses intact with no bruits No edema Neuro non-focal Skin warm and dry No muscular weakness   Current Outpatient Medications  Medication Sig Dispense Refill   acetaminophen (TYLENOL) 500 MG tablet Take 500 mg by mouth every 6 (six) hours as needed.     albuterol (PROVENTIL) (2.5 MG/3ML) 0.083% nebulizer solution Take 3 mLs (2.5 mg total) by nebulization every 6 (six) hours as needed for wheezing or shortness of breath. 360 mL 5   albuterol (VENTOLIN HFA) 108 (90 Base) MCG/ACT inhaler Inhale 2 puffs into the lungs every 4 (four) hours as needed for wheezing or shortness of breath. 18 each 0   alfuzosin (UROXATRAL) 10 MG 24 hr tablet Take 1 tablet (10 mg total) by mouth daily with breakfast. 90 tablet 3   azithromycin (ZITHROMAX) 250 MG tablet Take 2 on day one then 1 daily x 4 days 6 tablet 5   cetirizine (ZYRTEC) 10 MG tablet Take 10 mg by mouth daily.     COMBIGAN 0.2-0.5 % ophthalmic solution Apply 1 drop to eye 2 (two) times daily.     esomeprazole (NEXIUM) 20 MG capsule Take 20 mg by mouth daily.     fluticasone-salmeterol (ADVAIR HFA) 230-21 MCG/ACT inhaler Inhale 2 puffs into the  lungs 2 (two) times daily.     guaiFENesin (MUCINEX) 600 MG 12 hr tablet Take by mouth 2 (two) times daily.     LORazepam (ATIVAN) 0.5 MG tablet TAKE 1 TABLET BY MOUTH ONCE DAILY AT BEDTIME AS NEEDED 30 tablet 3   lovastatin (MEVACOR) 40 MG tablet Take 1 tablet (40 mg total) by mouth daily. 90 tablet 0   oxymetazoline (AFRIN) 0.05 % nasal spray Place 1 spray into both nostrils 2 (two) times daily.     Spacer/Aero-Holding Rudean Curt Use as directed 1 each 0   No current facility-administered medications for this visit.    Allergies  Bactrim [sulfamethoxazole-trimethoprim], Other, Beta adrenergic blockers, Lasix  [furosemide], Doxycycline, Penicillin g, Penicillin g sodium, Ciprofloxacin, Dexamethasone, Gabapentin, Methocarbamol, Neomycin, Penicillins, Sulfamethoxazole, and Tetracyclines & related  Electrocardiogram:  04/07/18  SR PAC;s otherwise normal 05/13/2023 SR rate 69 normal   Assessment and Plan  CAD:  Distant PCI RCA 1999. Myovue 05/2022 fixed old IMI no ischemia low risk  Clinically stable Intolerant to beta blockers continue medical Rx   AAA:  f/u Dr Chestine Spore 5.1 cm by recent CTA 01/01/23 Appears that f/u abdominal US ordered by [facility]   HTN:  Well controlled.  Continue current medications and low sodium Dash type diet.    GERD:  Continue carafate and nexium f/u GI  Asthma/Emphysema :  On inhalers no active wheezing F/U [facility]   PAC/SVT:  Stable unable to take beta blockers  Anxiety/Depression:   Has not started on SSRI   ENT:  ARB held hearing worse using Aides no further epistaxis Double vision w/u ongoing MRI negative f/u primary and Dr Dione Booze   HLD:  On mevacor 40 mg daily LDL 67 at goal   Hernia:  F/u Lovell Sheehan post surgery 2021 improved   Neuro:  right arm weakness and pain Cervical spine dx. Has prior lumbar fusion referral to spine surgeon made by Dr Dallas Schimke    F/U with me in 6 months  Charlton Haws

## 2023-05-17 ENCOUNTER — Ambulatory Visit (INDEPENDENT_AMBULATORY_CARE_PROVIDER_SITE_OTHER): Payer: Medicare Other | Admitting: Family Medicine

## 2023-05-17 DIAGNOSIS — R3915 Urgency of urination: Secondary | ICD-10-CM

## 2023-05-17 DIAGNOSIS — N401 Enlarged prostate with lower urinary tract symptoms: Secondary | ICD-10-CM | POA: Diagnosis not present

## 2023-05-17 LAB — POCT URINALYSIS DIPSTICK
Spec Grav, UA: 1.015 (ref 1.010–1.025)
pH, UA: 6 (ref 5.0–8.0)

## 2023-05-17 MED ORDER — MIRABEGRON ER 25 MG PO TB24: 25.0000 mg | ORAL_TABLET | Freq: Every day | ORAL | 1 refills | Status: DC

## 2023-05-17 NOTE — Assessment & Plan Note (Signed)
UA clear here today.  Sending culture given prior history of UTI.  I believe his symptoms are predominantly due to BPH and overactive bladder.  Trial of Myrbetriq.  Has upcoming follow-up with urology.

## 2023-05-17 NOTE — Progress Notes (Signed)
 Subjective:  Patient ID: [name] [name], male    DOB: [birth year]  Age: 82 y.o. MRN: [id number]  CC:   Chief Complaint  Patient presents with   Dysuria    congestion    HPI:  82 year old male with an extensive past medical history presents for evaluation of urinary symptoms.  This is a recurrent issue.  He is a prior UTI.  Patient states that he has had recent urinary symptoms as of 12/23.  Reports urinary urgency and frequency particularly at night.  He states that he has had several bouts of incontinence.  Has been getting up multiple times at night.  He states that on Friday he started taking a leftover prescription of Macrobid .  He states that this seemed to help.  He has had chills.  No fever.  No abdominal pain.  He has had some low back pain.  He has underlying BPH.  He is on alfuzosin  for this.  Patient Active Problem List   Diagnosis Date Noted   Benign prostatic hyperplasia (BPH) with urinary urgency 05/17/2023   Chronic asthma, severe persistent, uncomplicated 05/05/2023   Celiac artery stenosis (HCC) 02/02/2023   Chronic idiopathic constipation 12/21/2022   Osteoarthritis of knees, bilateral 04/14/2022   Irritable bowel syndrome 03/17/2022   Recurrent right lower quadrant abdominal pain 03/03/2022   Seasonal allergies 08/06/2021   COPD (chronic obstructive pulmonary disease) (HCC) 04/23/2021   CAD (coronary artery disease) 04/23/2021   Hypertension 09/16/2016   Lumbar spondylosis 11/05/2014   GERD (gastroesophageal reflux disease) 01/19/2012   Hyperlipidemia 04/15/2008   AAA (abdominal aortic aneurysm) (HCC) 04/15/2008    Social Hx   Social History   Socioeconomic History   Marital status: Widowed    Spouse name: Not on file   Number of children: Not on file   Years of education: Not on file   Highest education level: Not on file  Occupational History   Not on file  Tobacco Use   Smoking status: Former    Current packs/day: 0.00    Average packs/day: 1  pack/day for 35.0 years (35.0 ttl pk-yrs)    Types: Cigarettes    Start date: 03/27/1968    Quit date: 03/28/2003    Years since quitting: 20.1   Smokeless tobacco: Never  Vaping Use   Vaping status: Never Used  Substance and Sexual Activity   Alcohol use: No    Alcohol/week: 0.0 standard drinks of alcohol   Drug use: No   Sexual activity: Not Currently    Birth control/protection: None  Other Topics Concern   Not on file  Social History Narrative   Not on file   Social Drivers of Health   Financial Resource Strain: Low Risk  (03/18/2022)   Overall Financial Resource Strain (CARDIA)    Difficulty of Paying Living Expenses: Not very hard  Food Insecurity: No Food Insecurity (03/18/2022)   Hunger Vital Sign    Worried About Running Out of Food in the Last Year: Never true    Ran Out of Food in the Last Year: Never true  Transportation Needs: No Transportation Needs (03/18/2022)   PRAPARE - Administrator, Civil Service (Medical): No    Lack of Transportation (Non-Medical): No  Physical Activity: Insufficiently Active (03/18/2022)   Exercise Vital Sign    Days of Exercise per Week: 2 days    Minutes of Exercise per Session: 20 min  Stress: No Stress Concern Present (03/18/2022)   [employer] of Occupational Health -  Occupational Stress Questionnaire    Feeling of Stress : Not at all  Social Connections: Socially Isolated (03/18/2022)   Social Connection and Isolation Panel [NHANES]    Frequency of Communication with Friends and Family: More than three times a week    Frequency of Social Gatherings with Friends and Family: Three times a week    Attends Religious Services: Never    Active Member of Clubs or Organizations: No    Attends Banker Meetings: Never    Marital Status: Widowed    Review of Systems Per HPI  Objective:  BP 122/82   Ht 6' 1 (1.854 m)   Wt 208 lb 9.6 oz (94.6 kg)   BMI 27.52 kg/m      05/17/2023    9:08 AM  05/13/2023   11:04 AM 05/05/2023   10:54 AM  BP/Weight  Systolic BP 122  874  Diastolic BP 82  85  Wt. (Lbs) 208.6 205.5 205  BMI 27.52 kg/m2 27.11 kg/m2 27.05 kg/m2    Physical Exam Vitals and nursing note reviewed.  Constitutional:      General: He is not in acute distress.    Appearance: Normal appearance.  HENT:     Head: Normocephalic and atraumatic.  Cardiovascular:     Rate and Rhythm: Normal rate and regular rhythm.  Pulmonary:     Effort: Pulmonary effort is normal.     Breath sounds: Normal breath sounds. No wheezing or rales.  Abdominal:     Tenderness: There is no abdominal tenderness.  Neurological:     Mental Status: He is alert.     Lab Results  Component Value Date   WBC 9.0 03/19/2023   HGB 13.5 03/19/2023   HCT 41.4 03/19/2023   PLT 244 03/19/2023   GLUCOSE 145 (H) 03/19/2023   CHOL 167 04/28/2023   TRIG 99 04/28/2023   HDL 62 04/28/2023   LDLCALC 87 04/28/2023   ALT 16 06/29/2022   AST 18 06/29/2022   NA 136 03/19/2023   K 3.7 03/19/2023   CL 100 03/19/2023   CREATININE 0.92 03/19/2023   BUN 11 03/19/2023   CO2 29 03/19/2023   TSH 1.230 02/14/2019   PSA 1.80 07/26/2014   HGBA1C 6.1 (H) 04/23/2021     Assessment & Plan:   Problem List Items Addressed This Visit       Other   Benign prostatic hyperplasia (BPH) with urinary urgency   UA clear here today.  Sending culture given prior history of UTI.  I believe his symptoms are predominantly due to BPH and overactive bladder.  Trial of Myrbetriq .  Has upcoming follow-up with urology.      Relevant Orders   POCT urinalysis dipstick (Completed)   Urine Culture    Meds ordered this encounter  Medications   mirabegron  ER (MYRBETRIQ ) 25 MG TB24 tablet    Sig: Take 1 tablet (25 mg total) by mouth daily. May increase to 50 mg once daily after 4 weeks if needed.    Dispense:  90 tablet    Refill:  1    Follow-up:  Return in about 1 month (around 06/17/2023).  Jacqulyn Ahle  DO [facility]

## 2023-05-17 NOTE — Patient Instructions (Addendum)
Urine was clear. Awaiting culture.  Adding Myrbetriq.  Follow up in 1 month

## 2023-05-19 ENCOUNTER — Ambulatory Visit: Payer: Self-pay | Admitting: Family Medicine

## 2023-05-19 ENCOUNTER — Ambulatory Visit: Payer: Medicare Other | Admitting: Cardiovascular Disease

## 2023-05-19 LAB — URINE CULTURE

## 2023-05-19 LAB — SPECIMEN STATUS REPORT

## 2023-05-19 NOTE — Telephone Encounter (Signed)
 Everlene Other G, DO     Discontinue medication. Recommend reaching out to Urology.

## 2023-05-19 NOTE — Telephone Encounter (Signed)
 Copied from CRM [id number]. Topic: Clinical - Red Word Triage >> May 19, 2023 10:40 AM Graeme ORN wrote: Red Word that prompted transfer to Nurse Triage: Allergic reaction to recently prescribed medication. Breakout in hives  Chief Complaint: allegeric reactions to medication Symptoms: hives and itching Frequency: started Tuesday night is gone now since pt stopped medication and took benedryl Pertinent Negatives: Patient denies hives or itching now Disposition: [] ED /[] Urgent Care (no appt availability in office) / [] Appointment(In office/virtual)/ []  [facility]/ [] Home Care/ [] Refused Recommended Disposition /[] [place] [facility]/ [x]  Follow-up with PCP Additional Notes: Started taking new medication myrbetriq  on past Tuesday afternoon and Tuesday night broke out in hives and was itching bad - pt discontinued medication, took benadryl and it stopped itching and hives.Patient wanted to make PCP aware and see if PCP wanted to prescribe something else.  Pt wanted a call back    Reason for Disposition  [1] Hives from food reaction AND [2] diagnosis already confirmed  Answer Assessment - Initial Assessment Questions 1. APPEARANCE: What does the rash look like?      Red, flat hives  2. LOCATION: Where is the rash located?      Chest, back, shoulders, neck  3. NUMBER: How many hives are there?      Very many 4. SIZE: How big are the hives? (inches, cm, compare to coins) Do they all look the same or is there lots of variation in shape and size?      unsure 5. ONSET: When did the hives begin? (Hours or days ago)      Started taking new medication myrbetriq  on past Tuesday afternoon and Tuesday night patient had broke out in hives and was itching bad - pt discontinued medication, then took benadryl and pt is no longer itching and hives have pretty much all gone  6. ITCHING: Does it itch? If Yes, ask: How bad is the itch?    - MILD: doesn't interfere with normal  activities   - MODERATE-SEVERE: interferes with work, school, sleep, or other activities      moderate 7. RECURRENT PROBLEM: Have you had hives before? If Yes, ask: When was the last time? and What happened that time?      no 8. TRIGGERS: Were you exposed to any new food, plant, cosmetic product or animal just before the hives began?     Medication  9. OTHER SYMPTOMS: Do you have any other symptoms? (e.g., fever, tongue swelling, difficulty breathing, abdomen pain)     no 10. PREGNANCY: Is there any chance you are pregnant? When was your last menstrual period?       no  Protocols used: Hives-A-AH

## 2023-05-19 NOTE — Telephone Encounter (Signed)
 Patient notified and stated he stopped medication and had office visit scheduled 05/27/23 with urology.

## 2023-05-20 ENCOUNTER — Telehealth: Payer: Self-pay

## 2023-05-20 ENCOUNTER — Ambulatory Visit: Payer: Medicare Other | Admitting: Cardiovascular Disease

## 2023-05-20 NOTE — Telephone Encounter (Signed)
 Pt needs a PA done though cover my meds  [id number] Monda Jul [birth year] 2041  Budesonide-Formoterol 160-4.5 MCG/ACT Aerosol

## 2023-05-24 ENCOUNTER — Other Ambulatory Visit (HOSPITAL_COMMUNITY): Payer: Self-pay

## 2023-05-24 ENCOUNTER — Telehealth: Payer: Self-pay

## 2023-05-24 DIAGNOSIS — R399 Unspecified symptoms and signs involving the genitourinary system: Secondary | ICD-10-CM | POA: Insufficient documentation

## 2023-05-24 DIAGNOSIS — N401 Enlarged prostate with lower urinary tract symptoms: Secondary | ICD-10-CM | POA: Insufficient documentation

## 2023-05-24 NOTE — Progress Notes (Signed)
 Name: [name] [name] DOB: 02-11-1941 MRN: [id number]  History of Present Illness: [name] [name] is a 83 y.o. male who presents today at [facility] [place].  - GU history: 1. BPH with LUTS (urgency, frequency, nocturia x2-3). - Taking Uroxatral  10 mg nightly. - PSAs were normal in the past.  At last visit with Dr. Sherrilee on 01/28/2023: Doing well. PVR = 82 ml.  Since last visit: > 03/02/2023: Seen by PCP for UTI symptoms. Urine culture positive for Enterococcus faecalis; treated with Nitrofurantoin .   > 03/29/2023: UA & urine culture negative.  > 05/17/2023:  - Seen by PCP for LUTS including urinary urgency, frequency, nocturia, and several bouts of incontinence. Has been getting up multiple times at night. - UA negative.  - Prescribed Myrbetriq  25 mg daily.  Today: He reports that Myrbetriq  cause rash so that was discontinued and added to his allergy list.   He reports that he was treated for UTI 3 times in the past few months by his PCP.   Today he reports occasional mild dysuria at onset of voiding along with increased urinary urgency, nocturia, and occasional urge incontinence. Voiding 2-3x/day and 3x/night on average. Denies gross hematuria, hesitancy, straining to void, or sensations of incomplete emptying. He reports somewhat weak urinary stream.   He denies fevers but reports chronic chills intermittently. Reports persistent fatigue for the past several months. Denies weakness.  Denies pain with sitting or defecation. Denies constipation.   He reports fairly significant caffeine intake   Fall Screening: Do you usually have a device to assist in your mobility? No    Medications: Current Outpatient Medications  Medication Sig Dispense Refill   acetaminophen  (TYLENOL ) 500 MG tablet Take 500 mg by mouth every 6 (six) hours as needed.     albuterol  (PROVENTIL ) (2.5 MG/3ML) 0.083% nebulizer solution Take 3 mLs (2.5 mg total) by nebulization every 6 (six)  hours as needed for wheezing or shortness of breath. 360 mL 5   albuterol  (VENTOLIN  HFA) 108 (90 Base) MCG/ACT inhaler Inhale 2 puffs into the lungs every 4 (four) hours as needed for wheezing or shortness of breath. 18 each 0   azithromycin  (ZITHROMAX ) 250 MG tablet Take 2 on day one then 1 daily x 4 days 6 tablet 5   cetirizine (ZYRTEC) 10 MG tablet Take 10 mg by mouth daily.     COMBIGAN  0.2-0.5 % ophthalmic solution Apply 1 drop to eye 2 (two) times daily.     esomeprazole  (NEXIUM ) 20 MG capsule Take 20 mg by mouth daily.     fluticasone -salmeterol (ADVAIR  HFA) 230-21 MCG/ACT inhaler Inhale 2 puffs into the lungs 2 (two) times daily.     guaiFENesin  (MUCINEX ) 600 MG 12 hr tablet Take by mouth 2 (two) times daily.     LORazepam  (ATIVAN ) 0.5 MG tablet TAKE 1 TABLET BY MOUTH ONCE DAILY AT BEDTIME AS NEEDED 30 tablet 3   lovastatin  (MEVACOR ) 40 MG tablet Take 1 tablet (40 mg total) by mouth daily. 90 tablet 0   oxymetazoline  (AFRIN) 0.05 % nasal spray Place 1 spray into both nostrils 2 (two) times daily.     silodosin  (RAPAFLO ) 8 MG CAPS capsule Take 1 capsule (8 mg total) by mouth daily with breakfast. 30 capsule 2   Spacer/Aero-Holding Raguel FRENCH Use as directed 1 each 0   No current facility-administered medications for this visit.    Allergies: Allergies  Allergen Reactions   Bactrim  [Sulfamethoxazole -Trimethoprim ] Hives   Other Anaphylaxis and Other (See  Comments)   Beta Adrenergic Blockers Diarrhea and Other (See Comments)    Does not recall an allergy   Lasix  [Furosemide ] Rash   Doxycycline  Swelling    Lips swelling and skin peeling around mouth   Myrbetriq  [Mirabegron ] Hives and Itching   Penicillin G     Other reaction(s): Unknown   Penicillin G Sodium     Other reaction(s): Unknown   Ciprofloxacin  Nausea And Vomiting, Rash and Other (See Comments)    Body aches    Dexamethasone  Swelling   Gabapentin  Swelling   Methocarbamol Swelling and Rash   Neomycin Other (See  Comments)    Other reaction(s): Unknown   Penicillins Swelling and Rash    Has patient had a PCN reaction causing immediate rash, facial/tongue/throat swelling, SOB or lightheadedness with hypotension: yes Has patient had a PCN reaction causing severe rash involving mucus membranes or skin necrosis: unknown Has patient had a PCN reaction that required hospitalization: no Has patient had a PCN reaction occurring within the last 10 years: No If all of the above answers are NO, then may proceed with Cephalosporin use.    Sulfamethoxazole  Rash   Tetracyclines & Related Itching    Past Medical History:  Diagnosis Date   AAA (abdominal aortic aneurysm) (HCC)    needs yearly ultrasound   Allergy    Anemia    Arthritis    Asthma    BCC (basal cell carcinoma) 08/18/1989   left shoulder blad, upper right arm, left arm beyond elbow, c&d   BCC (basal cell carcinoma) 01/31/1992   Posterior neck, curetx3, 32fu   BCC (basal cell carcinoma) 11/22/2001   mid forehead, cx3, excision, right forearm, cx3, 28fu   BCC (basal cell carcinoma) 10/09/2003   mid forehead, MOHs   BCC (basal cell carcinoma) 08/15/2008   upper left back, biopsy   BPH (benign prostatic hyperplasia)    CAD (coronary artery disease)    Cancer (HCC)    skin cancer   COPD (chronic obstructive pulmonary disease) (HCC)    Dysrhythmia    pt. states it can be fast at times   GERD (gastroesophageal reflux disease)    Glaucoma    History of acute pancreatitis 12/21/2022   HOH (hard of hearing)    Hypercholesterolemia    Hypertension    Impaired fasting glucose    Low back pain    Melanoma in situ (HCC) 10/09/2003   left chin, MOHs   MI (myocardial infarction) (HCC) 1999   SCC (squamous cell carcinoma) 07/03/2014   in situ, behind left ear, cx3, cautery, 5fu   SCC (squamous cell carcinoma) 07/03/2014   well diff, left forearm, biopsy, cx1, cautery   SCC (squamous cell carcinoma) 07/20/2017   in situ, left upper arm, cx3,  29fu   SCC (squamous cell carcinoma) 01/10/2019   in situ, left post shoulder, cx3, 59fu   SCC (squamous cell carcinoma) 11/22/2001   left forearm distal, left forearm, cx3, 14fu   SCC (squamous cell carcinoma) 10/09/2003   [place], left ear post, clear per st, right cheek clear   SCC (squamous cell carcinoma) 03/30/2004   in situ, left upper arm, cx3, 57fu   SCC (squamous cell carcinoma) 03/08/2005   in situ, right cheek, mid upper forehead, cx3, 76fu   SCC (squamous cell carcinoma) 06/08/2006   in situ, left shoulder, cx3, 65fu   SCC (squamous cell carcinoma) 05/05/2010   right inner wrist, biopsy   SCC (squamous cell carcinoma) 09/13/2013   in situ, right  crown scalp, front scalp, biopsy   Thrush    Past Surgical History:  Procedure Laterality Date   BACK SURGERY     x 3   CARDIAC CATHETERIZATION     angioplasty   CATARACT EXTRACTION W/PHACO  03/20/2012   Procedure: CATARACT EXTRACTION PHACO AND INTRAOCULAR LENS PLACEMENT (IOC);  Surgeon: Dow JULIANNA Burke, MD;  Location: [facility];  Service: Ophthalmology;  Laterality: Right;  CDE:  8.45   CATARACT EXTRACTION W/PHACO Left 04/02/2013   Procedure: CATARACT EXTRACTION PHACO AND INTRAOCULAR LENS PLACEMENT (IOC);  Surgeon: Dow JULIANNA Burke, MD;  Location: [facility];  Service: Ophthalmology;  Laterality: Left;  CDE:  6.50   CHOLECYSTECTOMY  2000   COLONOSCOPY  2009   repeat 5 years   ESOPHAGOGASTRODUODENOSCOPY     HERNIA REPAIR Left    inguinal   INGUINAL HERNIA REPAIR Right 03/28/2020   Procedure: Right Inguinal Herniorrhaphy with Mesh;  Surgeon: Mavis Anes, MD;  Location: [facility];  Service: General;  Laterality: Right;   LAPAROSCOPIC PARTIAL COLECTOMY N/A 06/11/2013   Procedure: LAPAROSCOPIC HAND ASSISTED PARTIAL COLECTOMY;  Surgeon: Anes DELENA Mavis, MD;  Location: [facility];  Service: General;  Laterality: N/A;   NASAL ENDOSCOPY WITH EPISTAXIS CONTROL Bilateral 02/11/2020   Procedure: NASAL ENDOSCOPY WITH EPISTAXIS CONTROL;  Surgeon: Karis Clunes,  MD;  Location: [facility];  Service: ENT;  Laterality: Bilateral;   right eye detached retina Bilateral    SPINAL FUSION  2016   YAG LASER APPLICATION Left 05/06/2014   Procedure: YAG LASER APPLICATION;  Surgeon: Dow JULIANNA Burke, MD;  Location: [facility];  Service: Ophthalmology;  Laterality: Left;   Family History  Problem Relation Age of Onset   Hypertension Mother    COPD Father    Cancer Brother        brain   Social History   Socioeconomic History   Marital status: Widowed    Spouse name: Not on file   Number of children: Not on file   Years of education: Not on file   Highest education level: Not on file  Occupational History   Not on file  Tobacco Use   Smoking status: Former    Current packs/day: 0.00    Average packs/day: 1 pack/day for 35.0 years (35.0 ttl pk-yrs)    Types: Cigarettes    Start date: 03/27/1968    Quit date: 03/28/2003    Years since quitting: 20.1   Smokeless tobacco: Never  Vaping Use   Vaping status: Never Used  Substance and Sexual Activity   Alcohol use: No    Alcohol/week: 0.0 standard drinks of alcohol   Drug use: No   Sexual activity: Not Currently    Birth control/protection: None  Other Topics Concern   Not on file  Social History Narrative   Not on file   Social Drivers of Health   Financial Resource Strain: Low Risk  (03/18/2022)   Overall Financial Resource Strain (CARDIA)    Difficulty of Paying Living Expenses: Not very hard  Food Insecurity: No Food Insecurity (03/18/2022)   Hunger Vital Sign    Worried About Running Out of Food in the Last Year: Never true    Ran Out of Food in the Last Year: Never true  Transportation Needs: No Transportation Needs (03/18/2022)   PRAPARE - Administrator, Civil Service (Medical): No    Lack of Transportation (Non-Medical): No  Physical Activity: Insufficiently Active (03/18/2022)   Exercise Vital Sign  Days of Exercise per Week: 2 days    Minutes of Exercise  per Session: 20 min  Stress: No Stress Concern Present (03/18/2022)   [employer] of Occupational Health - Occupational Stress Questionnaire    Feeling of Stress : Not at all  Social Connections: Socially Isolated (03/18/2022)   Social Connection and Isolation Panel [NHANES]    Frequency of Communication with Friends and Family: More than three times a week    Frequency of Social Gatherings with Friends and Family: Three times a week    Attends Religious Services: Never    Active Member of Clubs or Organizations: No    Attends Banker Meetings: Never    Marital Status: Widowed  Intimate Partner Violence: Not At Risk (03/18/2022)   Humiliation, Afraid, Rape, and Kick questionnaire    Fear of Current or Ex-Partner: No    Emotionally Abused: No    Physically Abused: No    Sexually Abused: No    Review of Systems Constitutional: Patient denies any unintentional weight loss or change in strength lntegumentary: Patient denies any rashes or pruritus Cardiovascular: Patient denies chest pain or syncope Respiratory: Patient denies shortness of breath Gastrointestinal: Patient denies nausea, vomiting, constipation, or diarrhea Musculoskeletal: Patient denies muscle cramps or weakness Neurologic: Patient denies convulsions or seizures Allergic/Immunologic: Patient denies recent allergic reaction(s) Hematologic/Lymphatic: Patient denies bleeding tendencies Endocrine: Patient denies heat/cold intolerance  GU: As per HPI.  OBJECTIVE Vitals:   05/27/23 1218 05/27/23 1222  BP:  137/83  Pulse:  76  Temp: 97.8 F (36.6 C) 97.8 F (36.6 C)   There is no height or weight on file to calculate BMI.  Physical Examination Constitutional: No obvious distress; patient is non-toxic appearing  Cardiovascular: No visible lower extremity edema.  Respiratory: The patient does not have audible wheezing/stridor; respirations do not appear labored  Gastrointestinal: Abdomen  non-distended Musculoskeletal: Normal ROM of UEs  Skin: No obvious rashes/open sores  Neurologic: CN 2-12 grossly intact Psychiatric: Answered questions appropriately with normal affect  Hematologic/Lymphatic/Immunologic: No obvious bruises or sites of spontaneous bleeding  GU: Patient declined pelvic exam / DRE  UA: negative  PVR: <150 ml  ASSESSMENT BPH associated with nocturia - Plan: Urinalysis, Routine w reflex microscopic, BLADDER SCAN AMB NON-IMAGING, silodosin  (RAPAFLO ) 8 MG CAPS capsule  Lower urinary tract symptoms (LUTS) - Plan: Urinalysis, Routine w reflex microscopic, BLADDER SCAN AMB NON-IMAGING, silodosin  (RAPAFLO ) 8 MG CAPS capsule  We reviewed history in detail. No acute findings today - no evidence of UTI or retention; low suspicion for acute bacterial prostatitis. We discussed that his LUTS are likely due to his BPH and caffeine intake. Advised to decrease caffeine intake and switch from Uroxatral  to Rapaflo . Will plan for follow up in 4-6 weeks with Dr. Sherrilee or sooner if needed.   Advised to f/u with PCP for chronic fatigue / chills.  Pt verbalized understanding and agreement. All questions were answered.  PLAN Advised the following: 1. Decrease caffeine intake. 2. Discontinue Uroxatral . Start Rapaflo  8 mg daily.  3. Return in about 4 weeks (around 06/24/2023) for f/u with Dr. Sherrilee.  Orders Placed This Encounter  Procedures   Urinalysis, Routine w reflex microscopic   BLADDER SCAN AMB NON-IMAGING    It has been explained that the patient is to follow regularly with their PCP in addition to all other providers involved in their care and to follow instructions provided by these respective offices. Patient advised to contact urology clinic if any urologic-pertaining questions, concerns,  new symptoms or problems arise in the interim period.  There are no Patient Instructions on file for this visit.  Electronically signed by:  Lauraine JAYSON Oz, FNP    05/27/23    1:14 PM

## 2023-05-24 NOTE — Telephone Encounter (Signed)
 Pharmacy Patient Advocate Encounter   Received notification from Pt Calls Messages that prior authorization for Budesonide -Formoterol  Fumarate 160-4.5MCG/ACT aerosol is required/requested.   Insurance verification completed.   The patient is insured through [employer].   Per test claim:  Breyna  ** is preferred by the insurance.  If suggested medication is appropriate, Please send in a new RX and discontinue this one. If not, please advise as to why it's not appropriate so that we may request a Prior Authorization. Please note, some preferred medications may still require a PA   **Breyna  has co-pay of $183.21. Patient has deductible.

## 2023-05-24 NOTE — Telephone Encounter (Signed)
 PA request has been Cancelled. New Encounter created for follow up. For additional info see Pharmacy Prior Auth telephone encounter from 05-24-2023.

## 2023-05-26 ENCOUNTER — Ambulatory Visit: Payer: Medicare Other | Admitting: Primary Care

## 2023-05-27 ENCOUNTER — Ambulatory Visit (INDEPENDENT_AMBULATORY_CARE_PROVIDER_SITE_OTHER): Payer: Medicare Other | Admitting: Urology

## 2023-05-27 ENCOUNTER — Encounter: Payer: Self-pay | Admitting: Urology

## 2023-05-27 VITALS — BP 137/83 | HR 76 | Temp 97.8°F

## 2023-05-27 DIAGNOSIS — R351 Nocturia: Secondary | ICD-10-CM | POA: Diagnosis not present

## 2023-05-27 DIAGNOSIS — N401 Enlarged prostate with lower urinary tract symptoms: Secondary | ICD-10-CM

## 2023-05-27 DIAGNOSIS — R399 Unspecified symptoms and signs involving the genitourinary system: Secondary | ICD-10-CM | POA: Diagnosis not present

## 2023-05-27 LAB — URINALYSIS, ROUTINE W REFLEX MICROSCOPIC
Bilirubin, UA: NEGATIVE
Glucose, UA: NEGATIVE
Ketones, UA: NEGATIVE
Leukocytes,UA: NEGATIVE
Nitrite, UA: NEGATIVE
Protein,UA: NEGATIVE
RBC, UA: NEGATIVE
Specific Gravity, UA: 1.015 (ref 1.005–1.030)
Urobilinogen, Ur: 0.2 mg/dL (ref 0.2–1.0)
pH, UA: 7.5 (ref 5.0–7.5)

## 2023-05-27 MED ORDER — SILODOSIN 8 MG PO CAPS: 8.0000 mg | ORAL_CAPSULE | Freq: Every day | ORAL | 2 refills | Status: DC

## 2023-05-27 NOTE — Progress Notes (Signed)
PVR=133

## 2023-05-28 NOTE — Telephone Encounter (Signed)
 Routing to Dr. [name] for him to be able to view.

## 2023-05-29 NOTE — Telephone Encounter (Signed)
 Breyna  160 is same med  / same instructions

## 2023-05-30 ENCOUNTER — Telehealth: Payer: Self-pay

## 2023-05-30 ENCOUNTER — Other Ambulatory Visit: Payer: Self-pay

## 2023-05-30 ENCOUNTER — Telehealth: Payer: Self-pay | Admitting: Urology

## 2023-05-30 DIAGNOSIS — N401 Enlarged prostate with lower urinary tract symptoms: Secondary | ICD-10-CM

## 2023-05-30 MED ORDER — ALFUZOSIN HCL ER 10 MG PO TB24: 10.0000 mg | ORAL_TABLET | Freq: Every day | ORAL | 0 refills | Status: DC

## 2023-05-30 NOTE — Telephone Encounter (Signed)
 Called Pt to verify what medication he wanted to discontinue. Wants to stop taking Rapaflo to continue Alfuzosin until his next appt with MD McKenzie per verbal from MD okay to continue Alfuzosin and stop Rapaflo

## 2023-05-30 NOTE — Telephone Encounter (Signed)
 Left message with answering service Wants to know if he should discontinue previous medication since he was given a new medication

## 2023-05-30 NOTE — Telephone Encounter (Signed)
 Called and spoke with pt about the prior auth that was performed and recommendations per Dr. Darlean.  Looked back at prior message from 12/23 and saw where pt said he wanted to go back on the Advair  due to that working well for him. Stated to pt that if he wanted to stay on the Advair  that that was fine and he could further discuss inhalers with Dr. Darlean 3/14. Nothing further needed.

## 2023-05-30 NOTE — Telephone Encounter (Signed)
 PA not completed due Pt wanted to stay on Alfuzosin see previous telephone encounter

## 2023-06-02 ENCOUNTER — Ambulatory Visit: Payer: Medicare Other | Admitting: Urology

## 2023-06-06 DIAGNOSIS — M5412 Radiculopathy, cervical region: Secondary | ICD-10-CM | POA: Diagnosis not present

## 2023-06-06 DIAGNOSIS — Z6828 Body mass index (BMI) 28.0-28.9, adult: Secondary | ICD-10-CM | POA: Diagnosis not present

## 2023-06-08 ENCOUNTER — Ambulatory Visit: Payer: Medicare Other | Admitting: Orthopedic Surgery

## 2023-06-08 ENCOUNTER — Encounter: Payer: Self-pay | Admitting: Orthopedic Surgery

## 2023-06-08 VITALS — BP 140/94 | HR 87 | Ht 73.0 in | Wt 211.0 lb

## 2023-06-08 DIAGNOSIS — M1711 Unilateral primary osteoarthritis, right knee: Secondary | ICD-10-CM | POA: Diagnosis not present

## 2023-06-08 NOTE — Progress Notes (Signed)
Return patient Visit  Assessment: [name] [name] is a 83 y.o. male with the following: 1. Arthritis of right knee  Plan: [name] [name] has advanced degenerative changes in the right knee.  He has had both steroid and hyaluronic acid injections.  Last steroid injection was approximately 5 months ago.  He would like to proceed with another injection.  This was completed in clinic today.  We did discuss proceeding with hyaluronic acid injections once again.  This was completed in 2023.  He had a good response.  If his knee pain returns, we can work to gain authorization before scheduling these injections.   Procedure note injection Right knee joint   Verbal consent was obtained to inject the right knee joint  Timeout was completed to confirm the site of injection.  The skin was prepped with alcohol and ethyl chloride was sprayed at the injection site.  A 21-gauge needle was used to inject 40 mg of Depo-Medrol and 1% lidocaine (3 cc) into the right knee using an anterolateral approach.  There were no complications. A sterile bandage was applied.   Follow-up: Return if symptoms worsen or fail to improve.  Subjective:  Chief Complaint  Patient presents with   Knee Pain    R knee injection    History of Present Illness: [name] [name] is a 83 y.o. male who returns for evaluation of right knee pain.  I last saw him in clinic for his right knee, greater than 5 months ago.  He had an injection at that time.  He was doing quite well until recently.  He is starting to have a lot of pain in the right knee.  He would like to proceed with another injection.  Review of Systems: No fevers or chills No numbness or tingling No chest pain No shortness of breath No bowel or bladder dysfunction No GI distress No headaches   Objective: BP (!) 140/94   Pulse 87   Ht 6\' 1"  (1.854 m)   Wt 211 lb (95.7 kg)   BMI 27.84 kg/m   Physical Exam:  General: Elderly male., Alert and oriented., and No  acute distress. Gait: Right sided antalgic gait.  Right knee with mild varus alignment overall.  Tenderness to palpation over the medial joint line.  No effusion.  Range of motion from 3-120 degrees without discomfort.  No increased laxity varus or valgus stress.  Negative Lachman.  Toes are warm and well-perfused.  IMAGING: No new imaging obtained today   New Medications:  No orders of the defined types were placed in this encounter.     Oliver Barre, MD  06/08/2023 11:16 AM

## 2023-06-08 NOTE — Patient Instructions (Signed)

## 2023-06-16 ENCOUNTER — Other Ambulatory Visit: Payer: Self-pay | Admitting: Family Medicine

## 2023-06-16 ENCOUNTER — Ambulatory Visit: Payer: Self-pay

## 2023-06-16 ENCOUNTER — Ambulatory Visit: Payer: Self-pay | Admitting: Family Medicine

## 2023-06-16 MED ORDER — NYSTATIN 100000 UNIT/ML MT SUSP: 5.0000 mL | Freq: Four times a day (QID) | OROMUCOSAL | 0 refills | Status: DC

## 2023-06-16 NOTE — Telephone Encounter (Signed)
Chief Complaint: mouth pain Symptoms: mouth pain and white patches Frequency: onset 2-3 days ago Pertinent Negatives: Patient denies fever, difficulty breathing, chemo/radiation, difficulty swallowing Disposition: [] ED /[x] Urgent Care (no appt availability in office) / [] Appointment(In office/virtual)/ []  [facility]/ [] Home Care/ [] Refused Recommended Disposition /[] [facility]/ []  Follow-up with PCP Additional Notes: Pt reports mouth pain ("burning up") around his gums with white patches to his tongue and the roof of his mouth. Pt reports recently finishing a 7-day course of clindamycin. Pt believes he has oral thrush. Pt denies difficulty breathing, fever, or chemo/radiation. Pt states that his mouth is quite dry, but that he is able to swallow. Pt has no other symptoms. Per protocol, RN attempted to schedule appt within 3 days. No availability in office. RN scheduled pt for [facility] today at 1600. Pt agreeable to plan and states he will be able to get there safely. RN advised pt to call back if he worsens. Pt verbalized understanding.   Copied from CRM [id number]. Topic: Clinical - Red Word Triage >> Jun 16, 2023 12:12 PM Geroge Baseman wrote: Red Word that prompted transfer to Nurse Triage: possible oral thrush wanting to speak with a nurse asap Reason for Disposition  [1] White patches that stick to tongue or inner cheek AND [2] can be wiped off  Answer Assessment - Initial Assessment Questions 1. SYMPTOM: "What's the main symptom you're concerned about?" (e.g., chapped lips, dry mouth, lump, sores)     "Mouth is burning up around my gums, real white. My tongue is white, a white film. I finished last Sunday a 7-day course of clindamycin. Roof of mouth is also white." 2. ONSET: "When did the pain start?"     "It started 2-3 days ago." 3. PAIN: "Is there any pain?" If Yes, ask: "How bad is it?" (Scale: 1-10; mild, moderate, severe)   - MILD (1-3):  doesn't interfere with  eating or normal activities   - MODERATE (4-7): interferes with eating some solids and normal activities   - SEVERE (8-10):  excruciating pain, interferes with most normal activities   - SEVERE DYSPHAGIA: can't swallow liquids, drooling     5-6/10 "at times", my mouth is extremely dry. "Drinking a Pepsi makes it burn." Liquids stay down. Pt states there is no much saliva in his mouth because his mouth is so dry. 4. CAUSE: "What do you think is causing the symptoms?"     Pt thinks he has oral thrush. 5. OTHER SYMPTOMS: "Do you have any other symptoms?" (e.g., fever, sore throat, toothache, swelling)     Hx of COPD. No other symptoms.  Answer Assessment - Initial Assessment Questions 1. ONSET: "When did the mouth start hurting?" (e.g., hours or days ago)      2-3 days ago 2. SEVERITY: "How bad is the pain?" (Scale 1-10; mild, moderate or severe)   - MILD (1-3):  doesn't interfere with eating or normal activities   - MODERATE (4-7): interferes with eating some solids and normal activities   - SEVERE (8-10):  excruciating pain, interferes with most normal activities   - SEVERE DYSPHAGIA: can't swallow liquids, drooling     5-6/10 "at times" 3. SORES: "Are there any sores or ulcers in the mouth?" If Yes, ask: "What part of the mouth are the sores in?"     White patches to the tongue and roof of mouth 4. FEVER: "Do you have a fever?" If Yes, ask: "What is your temperature, how was it  measured, and when did it start?"     No 5. CAUSE: "What do you think is causing the mouth pain?"     Pt believes he has oral thrush 6. OTHER SYMPTOMS: "Do you have any other symptoms?" (e.g., difficulty breathing)     None  Protocols used: Mouth Symptoms-A-AH, Mouth Pain-A-AH

## 2023-06-20 ENCOUNTER — Ambulatory Visit: Payer: Medicare Other | Admitting: Family Medicine

## 2023-06-20 DIAGNOSIS — C44311 Basal cell carcinoma of skin of nose: Secondary | ICD-10-CM | POA: Diagnosis not present

## 2023-06-20 DIAGNOSIS — D2362 Other benign neoplasm of skin of left upper limb, including shoulder: Secondary | ICD-10-CM | POA: Diagnosis not present

## 2023-06-24 ENCOUNTER — Ambulatory Visit (HOSPITAL_COMMUNITY): Payer: Medicare Other

## 2023-06-27 ENCOUNTER — Telehealth: Payer: Self-pay

## 2023-06-27 ENCOUNTER — Ambulatory Visit: Payer: Medicare Other | Admitting: Urology

## 2023-06-27 VITALS — BP 147/71 | HR 101

## 2023-06-27 DIAGNOSIS — N401 Enlarged prostate with lower urinary tract symptoms: Secondary | ICD-10-CM | POA: Diagnosis not present

## 2023-06-27 DIAGNOSIS — N400 Enlarged prostate without lower urinary tract symptoms: Secondary | ICD-10-CM

## 2023-06-27 DIAGNOSIS — R351 Nocturia: Secondary | ICD-10-CM | POA: Diagnosis not present

## 2023-06-27 LAB — URINALYSIS, ROUTINE W REFLEX MICROSCOPIC
Bilirubin, UA: NEGATIVE
Glucose, UA: NEGATIVE
Ketones, UA: NEGATIVE
Leukocytes,UA: NEGATIVE
Nitrite, UA: NEGATIVE
Protein,UA: NEGATIVE
RBC, UA: NEGATIVE
Specific Gravity, UA: 1.01 (ref 1.005–1.030)
Urobilinogen, Ur: 0.2 mg/dL (ref 0.2–1.0)
pH, UA: 6.5 (ref 5.0–7.5)

## 2023-06-27 LAB — BLADDER SCAN AMB NON-IMAGING: Scan Result: 1

## 2023-06-27 MED ORDER — CEFPODOXIME PROXETIL 100 MG PO TABS: 100.0000 mg | ORAL_TABLET | Freq: Two times a day (BID) | ORAL | 0 refills | Status: DC

## 2023-06-27 NOTE — Telephone Encounter (Signed)
 Pharmacy called to verify that Pt could receive the Rx Vantin  per MD McKenzie Pt is able to have it

## 2023-06-27 NOTE — Progress Notes (Signed)
 post void residual=1

## 2023-06-27 NOTE — Progress Notes (Signed)
06/27/2023 11:20 AM   [name] [name] 07-16-1940 [id number]  Referring provider: Tommie Sams, DO [address] [place],  [place] [address]  Difficulty urinating.   HPI: [name] is a 82yo here for followup for BPH and nocturia. In Oct 2024 he was diagnosed with a UTI and treated with macrobid. Symptoms improved  and then returned in 03/2023. Urine culture negative. He then developed worsening LUTS in 04/2023 and he self treated with macrobid and the LUTS improved. Since then he has noted worsening urinary urgency, frequency, and occasional dysuria. IPSS 18 QOL 4 on uroxatral. He never started the rapaflo.    PMH: Past Medical History:  Diagnosis Date   AAA (abdominal aortic aneurysm) (HCC)    needs yearly ultrasound   Allergy    Anemia    Arthritis    Asthma    BCC (basal cell carcinoma) 08/18/1989   left shoulder blad, upper right arm, left arm beyond elbow, c&d   BCC (basal cell carcinoma) 01/31/1992   Posterior neck, curetx3, 48fu   BCC (basal cell carcinoma) 11/22/2001   mid forehead, cx3, excision, right forearm, cx3, 51fu   BCC (basal cell carcinoma) 10/09/2003   mid forehead, MOHs   BCC (basal cell carcinoma) 08/15/2008   upper left back, biopsy   BPH (benign prostatic hyperplasia)    CAD (coronary artery disease)    Cancer (HCC)    skin cancer   COPD (chronic obstructive pulmonary disease) (HCC)    Dysrhythmia    pt. states it can be fast at times   GERD (gastroesophageal reflux disease)    Glaucoma    History of acute pancreatitis 12/21/2022   HOH (hard of hearing)    Hypercholesterolemia    Hypertension    Impaired fasting glucose    Low back pain    Melanoma in situ (HCC) 10/09/2003   left chin, MOHs   MI (myocardial infarction) (HCC) 1999   SCC (squamous cell carcinoma) 07/03/2014   in situ, behind left ear, cx3, cautery, 36fu   SCC (squamous cell carcinoma) 07/03/2014   well diff, left forearm, biopsy, cx1, cautery   SCC (squamous cell carcinoma)  07/20/2017   in situ, left upper arm, cx3, 106fu   SCC (squamous cell carcinoma) 01/10/2019   in situ, left post shoulder, cx3, 74fu   SCC (squamous cell carcinoma) 11/22/2001   left forearm distal, left forearm, cx3, 82fu   SCC (squamous cell carcinoma) 10/09/2003   [place], left ear post, clear per st, right cheek clear   SCC (squamous cell carcinoma) 03/30/2004   in situ, left upper arm, cx3, 80fu   SCC (squamous cell carcinoma) 03/08/2005   in situ, right cheek, mid upper forehead, cx3, 37fu   SCC (squamous cell carcinoma) 06/08/2006   in situ, left shoulder, cx3, 96fu   SCC (squamous cell carcinoma) 05/05/2010   right inner wrist, biopsy   SCC (squamous cell carcinoma) 09/13/2013   in situ, right crown scalp, front scalp, biopsy   Thrush     Surgical History: Past Surgical History:  Procedure Laterality Date   BACK SURGERY     x 3   CARDIAC CATHETERIZATION     angioplasty   CATARACT EXTRACTION W/PHACO  03/20/2012   Procedure: CATARACT EXTRACTION PHACO AND INTRAOCULAR LENS PLACEMENT (IOC);  Surgeon: Susa Simmonds, MD;  Location: [facility];  Service: Ophthalmology;  Laterality: Right;  CDE:  8.45   CATARACT EXTRACTION W/PHACO Left 04/02/2013   Procedure: CATARACT EXTRACTION PHACO AND  INTRAOCULAR LENS PLACEMENT (IOC);  Surgeon: Susa Simmonds, MD;  Location: [facility];  Service: Ophthalmology;  Laterality: Left;  CDE:  6.50   CHOLECYSTECTOMY  2000   COLONOSCOPY  2009   repeat 5 years   ESOPHAGOGASTRODUODENOSCOPY     HERNIA REPAIR Left    inguinal   INGUINAL HERNIA REPAIR Right 03/28/2020   Procedure: Right Inguinal Herniorrhaphy with Mesh;  Surgeon: Franky Macho, MD;  Location: [facility];  Service: General;  Laterality: Right;   LAPAROSCOPIC PARTIAL COLECTOMY N/A 06/11/2013   Procedure: LAPAROSCOPIC HAND ASSISTED PARTIAL COLECTOMY;  Surgeon: Dalia Heading, MD;  Location: [facility];  Service: General;  Laterality: N/A;   NASAL ENDOSCOPY WITH EPISTAXIS CONTROL Bilateral 02/11/2020    Procedure: NASAL ENDOSCOPY WITH EPISTAXIS CONTROL;  Surgeon: Newman Pies, MD;  Location: [facility];  Service: ENT;  Laterality: Bilateral;   right eye detached retina Bilateral    SPINAL FUSION  2016   YAG LASER APPLICATION Left 05/06/2014   Procedure: YAG LASER APPLICATION;  Surgeon: Susa Simmonds, MD;  Location: [facility];  Service: Ophthalmology;  Laterality: Left;    Home Medications:  Allergies as of 06/27/2023       Reactions   Bactrim [sulfamethoxazole-trimethoprim] Hives   Other Anaphylaxis, Other (See Comments)   Beta Adrenergic Blockers Diarrhea, Other (See Comments)   Does not recall an allergy   Lasix [furosemide] Rash   Doxycycline Swelling   Lips swelling and skin peeling around mouth   Myrbetriq [mirabegron] Hives, Itching   Penicillin G    Other reaction(s): Unknown   Penicillin G Sodium    Other reaction(s): Unknown   Ciprofloxacin Nausea And Vomiting, Rash, Other (See Comments)   Body aches   Dexamethasone Swelling   Gabapentin Swelling   Methocarbamol Swelling, Rash   Neomycin Other (See Comments)   Other reaction(s): Unknown   Penicillins Swelling, Rash   Has patient had a PCN reaction causing immediate rash, facial/tongue/throat swelling, SOB or lightheadedness with hypotension: yes Has patient had a PCN reaction causing severe rash involving mucus membranes or skin necrosis: unknown Has patient had a PCN reaction that required hospitalization: no Has patient had a PCN reaction occurring within the last 10 years: No If all of the above answers are "NO", then may proceed with Cephalosporin use.   Sulfamethoxazole Rash   Tetracyclines & Related Itching        Medication List        Accurate as of June 27, 2023 11:20 AM. If you have any questions, ask your nurse or doctor.          acetaminophen 500 MG tablet Commonly known as: TYLENOL Take 500 mg by mouth every 6 (six) hours as needed.   albuterol (2.5 MG/3ML) 0.083% nebulizer  solution Commonly known as: PROVENTIL Take 3 mLs (2.5 mg total) by nebulization every 6 (six) hours as needed for wheezing or shortness of breath.   albuterol 108 (90 Base) MCG/ACT inhaler Commonly known as: VENTOLIN HFA Inhale 2 puffs into the lungs every 4 (four) hours as needed for wheezing or shortness of breath.   alfuzosin 10 MG 24 hr tablet Commonly known as: UROXATRAL Take 1 tablet (10 mg total) by mouth daily with breakfast.   azithromycin 250 MG tablet Commonly known as: ZITHROMAX Take 2 on day one then 1 daily x 4 days   cetirizine 10 MG tablet Commonly known as: ZYRTEC Take 10 mg by mouth daily.   Combigan 0.2-0.5 % ophthalmic solution  Generic drug: brimonidine-timolol Apply 1 drop to eye 2 (two) times daily.   esomeprazole 20 MG capsule Commonly known as: NEXIUM Take 20 mg by mouth daily.   fluticasone-salmeterol 230-21 MCG/ACT inhaler Commonly known as: Advair HFA Inhale 2 puffs into the lungs 2 (two) times daily.   guaiFENesin 600 MG 12 hr tablet Commonly known as: MUCINEX Take by mouth 2 (two) times daily.   LORazepam 0.5 MG tablet Commonly known as: ATIVAN TAKE 1 TABLET BY MOUTH ONCE DAILY AT BEDTIME AS NEEDED   lovastatin 40 MG tablet Commonly known as: MEVACOR Take 1 tablet (40 mg total) by mouth daily.   nystatin 100000 UNIT/ML suspension Commonly known as: MYCOSTATIN Take 5 mLs (500,000 Units total) by mouth 4 (four) times daily. Swish and swallow.   oxymetazoline 0.05 % nasal spray Commonly known as: AFRIN Place 1 spray into both nostrils 2 (two) times daily.   Spacer/Aero-Holding [employer] Use as directed        Allergies:  Allergies  Allergen Reactions   Bactrim [Sulfamethoxazole-Trimethoprim] Hives   Other Anaphylaxis and Other (See Comments)   Beta Adrenergic Blockers Diarrhea and Other (See Comments)    Does not recall an allergy   Lasix [Furosemide] Rash   Doxycycline Swelling    Lips swelling and skin peeling around  mouth   Myrbetriq [Mirabegron] Hives and Itching   Penicillin G     Other reaction(s): Unknown   Penicillin G Sodium     Other reaction(s): Unknown   Ciprofloxacin Nausea And Vomiting, Rash and Other (See Comments)    Body aches    Dexamethasone Swelling   Gabapentin Swelling   Methocarbamol Swelling and Rash   Neomycin Other (See Comments)    Other reaction(s): Unknown   Penicillins Swelling and Rash    Has patient had a PCN reaction causing immediate rash, facial/tongue/throat swelling, SOB or lightheadedness with hypotension: yes Has patient had a PCN reaction causing severe rash involving mucus membranes or skin necrosis: unknown Has patient had a PCN reaction that required hospitalization: no Has patient had a PCN reaction occurring within the last 10 years: No If all of the above answers are "NO", then may proceed with Cephalosporin use.    Sulfamethoxazole Rash   Tetracyclines & Related Itching    Family History: Family History  Problem Relation Age of Onset   Hypertension Mother    COPD Father    Cancer Brother        brain    Social History:  reports that he quit smoking about 20 years ago. His smoking use included cigarettes. He started smoking about 55 years ago. He has a 35 pack-year smoking history. He has never used smokeless tobacco. He reports that he does not drink alcohol and does not use drugs.  ROS: All other review of systems were reviewed and are negative except what is noted above in HPI  Physical Exam: BP (!) 147/71   Pulse (!) 101   Constitutional:  Alert and oriented, No acute distress. HEENT: [place] AT, moist mucus membranes.  Trachea midline, no masses. Cardiovascular: No clubbing, cyanosis, or edema. Respiratory: Normal respiratory effort, no increased work of breathing. GI: Abdomen is soft, nontender, nondistended, no abdominal masses GU: No CVA tenderness.  Lymph: No cervical or inguinal lymphadenopathy. Skin: No rashes, bruises or  suspicious lesions. Neurologic: Grossly intact, no focal deficits, moving all 4 extremities. Psychiatric: Normal mood and affect.  Laboratory Data: Lab Results  Component Value Date   WBC 9.0 03/19/2023  HGB 13.5 03/19/2023   HCT 41.4 03/19/2023   MCV 88.1 03/19/2023   PLT 244 03/19/2023    Lab Results  Component Value Date   CREATININE 0.92 03/19/2023    Lab Results  Component Value Date   PSA 1.80 07/26/2014   PSA 1.42 03/26/2013    No results found for: "TESTOSTERONE"  Lab Results  Component Value Date   HGBA1C 6.1 (H) 04/23/2021    Urinalysis    Component Value Date/Time   COLORURINE STRAW (A) 12/28/2019 1541   APPEARANCEUR Clear 05/27/2023 1155   LABSPEC 1.005 12/28/2019 1541   PHURINE 8.0 12/28/2019 1541   GLUCOSEU Negative 05/27/2023 1155   HGBUR NEGATIVE 12/28/2019 1541   BILIRUBINUR Negative 05/27/2023 1155   KETONESUR negative 03/29/2023 1512   KETONESUR NEGATIVE 12/28/2019 1541   PROTEINUR Negative 05/27/2023 1155   PROTEINUR NEGATIVE 12/28/2019 1541   UROBILINOGEN 0.2 03/29/2023 1512   UROBILINOGEN 0.2 12/04/2014 0240   NITRITE Negative 05/27/2023 1155   NITRITE NEGATIVE 12/28/2019 1541   LEUKOCYTESUR Negative 05/27/2023 1155   LEUKOCYTESUR NEGATIVE 12/28/2019 1541    Lab Results  Component Value Date   LABMICR Comment 05/27/2023   WBCUA None seen 04/30/2020   LABEPIT None seen 04/30/2020   BACTERIA None seen 04/30/2020    Pertinent Imaging:  Results for orders placed during the hospital encounter of 11/11/14  DG Abd 1 View  Narrative CLINICAL DATA:  Difficulty urinating with burning and pelvic pressure for 6 days.  EXAM: ABDOMEN - 1 VIEW  COMPARISON:  None.  FINDINGS: The bowel gas pattern is nonobstructive. Prominent stool burden in the transverse colon is noted. No unexpected abdominal calcification is seen. The patient is status post lower lumbar fusion.  IMPRESSION: No acute abnormality.   Electronically  Signed By: Drusilla Kanner M.D. On: 11/11/2014 17:46  Results for orders placed in visit on 03/30/22  US Venous Img Lower Bilateral  Narrative CLINICAL DATA:  Bilateral lower extremity edema  EXAM: BILATERAL LOWER EXTREMITY VENOUS DOPPLER ULTRASOUND  TECHNIQUE: Gray-scale sonography with graded compression, as well as color Doppler and duplex ultrasound were performed to evaluate the lower extremity deep venous systems from the level of the common femoral vein and including the common femoral, femoral, profunda femoral, popliteal and calf veins including the posterior tibial, peroneal and gastrocnemius veins when visible. The superficial great saphenous vein was also interrogated. Spectral Doppler was utilized to evaluate flow at rest and with distal augmentation maneuvers in the common femoral, femoral and popliteal veins.  COMPARISON:  None Available.  FINDINGS: RIGHT LOWER EXTREMITY  Common Femoral Vein: No evidence of thrombus. Normal compressibility, respiratory phasicity and response to augmentation.  Saphenofemoral Junction: No evidence of thrombus. Normal compressibility and flow on color Doppler imaging.  Profunda Femoral Vein: No evidence of thrombus. Normal compressibility and flow on color Doppler imaging.  Femoral Vein: No evidence of thrombus. Normal compressibility, respiratory phasicity and response to augmentation.  Popliteal Vein: No evidence of thrombus. Normal compressibility, respiratory phasicity and response to augmentation.  Calf Veins: No evidence of thrombus. Normal compressibility and flow on color Doppler imaging.  Superficial Great Saphenous Vein: No evidence of thrombus. Normal compressibility.  Venous Reflux:  None.  Other Findings:  None.  LEFT LOWER EXTREMITY  Common Femoral Vein: No evidence of thrombus. Normal compressibility, respiratory phasicity and response to augmentation.  Saphenofemoral Junction: No evidence of  thrombus. Normal compressibility and flow on color Doppler imaging.  Profunda Femoral Vein: No evidence of thrombus. Normal compressibility and flow  on color Doppler imaging.  Femoral Vein: No evidence of thrombus. Normal compressibility, respiratory phasicity and response to augmentation.  Popliteal Vein: No evidence of thrombus. Normal compressibility, respiratory phasicity and response to augmentation.  Calf Veins: No evidence of thrombus. Normal compressibility and flow on color Doppler imaging.  Superficial Great Saphenous Vein: No evidence of thrombus. Normal compressibility.  Venous Reflux:  None.  Other Findings:  None.  IMPRESSION: No evidence of deep venous thrombosis in either lower extremity.   Electronically Signed By: Malachy Moan M.D. On: 03/31/2022 09:45  No results found for this or any previous visit.  No results found for this or any previous visit.  No results found for this or any previous visit.  No results found for this or any previous visit.  No results found for this or any previous visit.  No results found for this or any previous visit.   Assessment & Plan:    1. Chronic prostatitis Vantin 100mg  BID for 28 days  - BLADDER SCAN AMB NON-IMAGING - Urinalysis, Routine w reflex microscopic  2. BPH associated with nocturia Rapaflo 8mg  daily  3. Nocturia Rapaflo 8mg  daily   No follow-ups on file.  Wilkie Aye, MD  [facility] [place]

## 2023-06-30 ENCOUNTER — Other Ambulatory Visit: Payer: Self-pay | Admitting: Family Medicine

## 2023-06-30 ENCOUNTER — Ambulatory Visit: Payer: Medicare Other | Admitting: Family Medicine

## 2023-06-30 DIAGNOSIS — E785 Hyperlipidemia, unspecified: Secondary | ICD-10-CM

## 2023-07-05 ENCOUNTER — Encounter: Payer: Self-pay | Admitting: Urology

## 2023-07-05 NOTE — Patient Instructions (Signed)
 Prostatitis  Prostatitis is swelling of the prostate gland, also called the prostate. This gland is about 1.5 inches wide and 1 inch high, and it helps to make a fluid called semen. The prostate is below a man's bladder, in front of the butt (rectum). There are different types of prostatitis. What are the causes? One type of prostatitis is caused by an infection from germs (bacteria). Another type is not caused by germs. It may be caused by: Things having to do with the nervous system. This system includes thebrain, spinal cord, and nerves. An autoimmune response. This happens when the body's disease-fighting system attacks healthy tissue in the body by mistake. Psychological factors. These have to do with how the mind works. The causes of other types of prostatitis are normally not known. What are the signs or symptoms? Symptoms of this condition depend on the type of prostatitis you have. If your condition is caused by germs: You may feel pain or burning when you pee (urinate). You may pee often and all of a sudden. You may have problems starting to pee. You may have trouble emptying your bladder when you pee. You may have fever or chills. You may feel pain in your muscles, joints, low back, or lower belly. If you have other types of prostatitis: You may pee often or all of a sudden. You may have trouble starting to pee. You may have a weak flow when you pee. You may leak pee after using the bathroom. You may have other problems, such as: Abnormal fluid coming from the penis. Pain in the testicles or penis. Pain between the butt and the testicles. Pain when fluid comes out of the penis during sex. How is this treated? Treatment for this condition depends on the type of prostatitis. Treatment may include: Medicines. These may treat pain or swelling, or they may help relax muscles. Exercises to help you move better or get stronger (physical therapy). Heat therapy. Techniques to help  you control some of the ways that your body works. Exercises to help you relax. Antibiotic medicine, if your condition is caused by germs. Warm water baths (sitz baths) to relax muscles. Follow these instructions at home: Medicines Take over-the-counter and prescription medicines only as told by your doctor. If you were prescribed an antibiotic medicine, take it as told by your doctor. Do not stop using the antibiotic even if you start to feel better. Managing pain and swelling  Take sitz baths as told by your doctor. For a sitz bath, sit in warm water that is deep enough to cover your hips and butt. If told, put heat on the painful area. Do this as often as told by your doctor. Use the heat source that your doctor recommends, such as a moist heat pack or a heating pad. Place a towel between your skin and the heat source. Leave the heat on for 20-30 minutes. Take off the heat if your skin turns bright red. This is very important if you are unable to feel pain, heat, or cold. You may have a greater risk of getting burned. General instructions Do exercises as told by your doctor, if your doctor prescribed them. Keep all follow-up visits as told by your doctor. This is important. Where to find more information [employer] of Diabetes and Digestive and Kidney Diseases: [employer] Contact a doctor if: Your symptoms get worse. You have a fever. Get help right away if: You have chills. You feel light-headed. You feel like you may  faint. You cannot pee. You have blood or clumps of blood (blood clots) in your pee. Summary Prostatitis is swelling of the prostate gland. There are different types of prostatitis. Treatment depends on the type that you have. Take over-the-counter and prescription medicines only as told by your doctor. Get help right away of you have chills, feel light-headed, or feel like you may faint. Also get help right away if you cannot pee or you have  blood or clumps of blood in your pee. This information is not intended to replace advice given to you by your health care provider. Make sure you discuss any questions you have with your health care provider. Document Revised: 03/18/2022 Document Reviewed: 03/18/2022 Elsevier Patient Education  2024 [employer].

## 2023-07-06 NOTE — Therapy (Incomplete)
OUTPATIENT PHYSICAL THERAPY CERVICAL EVALUATION   Patient Name: [name] [name] MRN: [id number] DOB:1940-10-28, 83 y.o., male Today's Date: 07/06/2023  END OF SESSION:   Past Medical History:  Diagnosis Date   AAA (abdominal aortic aneurysm) (HCC)    needs yearly ultrasound   Allergy    Anemia    Arthritis    Asthma    BCC (basal cell carcinoma) 08/18/1989   left shoulder blad, upper right arm, left arm beyond elbow, c&d   BCC (basal cell carcinoma) 01/31/1992   Posterior neck, curetx3, 74fu   BCC (basal cell carcinoma) 11/22/2001   mid forehead, cx3, excision, right forearm, cx3, 60fu   BCC (basal cell carcinoma) 10/09/2003   mid forehead, MOHs   BCC (basal cell carcinoma) 08/15/2008   upper left back, biopsy   BPH (benign prostatic hyperplasia)    CAD (coronary artery disease)    Cancer (HCC)    skin cancer   COPD (chronic obstructive pulmonary disease) (HCC)    Dysrhythmia    pt. states it can be fast at times   GERD (gastroesophageal reflux disease)    Glaucoma    History of acute pancreatitis 12/21/2022   HOH (hard of hearing)    Hypercholesterolemia    Hypertension    Impaired fasting glucose    Low back pain    Melanoma in situ (HCC) 10/09/2003   left chin, MOHs   MI (myocardial infarction) (HCC) 1999   SCC (squamous cell carcinoma) 07/03/2014   in situ, behind left ear, cx3, cautery, 86fu   SCC (squamous cell carcinoma) 07/03/2014   well diff, left forearm, biopsy, cx1, cautery   SCC (squamous cell carcinoma) 07/20/2017   in situ, left upper arm, cx3, 33fu   SCC (squamous cell carcinoma) 01/10/2019   in situ, left post shoulder, cx3, 89fu   SCC (squamous cell carcinoma) 11/22/2001   left forearm distal, left forearm, cx3, 44fu   SCC (squamous cell carcinoma) 10/09/2003   [place], left ear post, clear per st, right cheek clear   SCC (squamous cell carcinoma) 03/30/2004   in situ, left upper arm, cx3, 97fu   SCC (squamous cell carcinoma) 03/08/2005   in situ,  right cheek, mid upper forehead, cx3, 39fu   SCC (squamous cell carcinoma) 06/08/2006   in situ, left shoulder, cx3, 76fu   SCC (squamous cell carcinoma) 05/05/2010   right inner wrist, biopsy   SCC (squamous cell carcinoma) 09/13/2013   in situ, right crown scalp, front scalp, biopsy   Thrush    Past Surgical History:  Procedure Laterality Date   BACK SURGERY     x 3   CARDIAC CATHETERIZATION     angioplasty   CATARACT EXTRACTION W/PHACO  03/20/2012   Procedure: CATARACT EXTRACTION PHACO AND INTRAOCULAR LENS PLACEMENT (IOC);  Surgeon: Susa Simmonds, MD;  Location: [facility];  Service: Ophthalmology;  Laterality: Right;  CDE:  8.45   CATARACT EXTRACTION W/PHACO Left 04/02/2013   Procedure: CATARACT EXTRACTION PHACO AND INTRAOCULAR LENS PLACEMENT (IOC);  Surgeon: Susa Simmonds, MD;  Location: [facility];  Service: Ophthalmology;  Laterality: Left;  CDE:  6.50   CHOLECYSTECTOMY  2000   COLONOSCOPY  2009   repeat 5 years   ESOPHAGOGASTRODUODENOSCOPY     HERNIA REPAIR Left    inguinal   INGUINAL HERNIA REPAIR Right 03/28/2020   Procedure: Right Inguinal Herniorrhaphy with Mesh;  Surgeon: Franky Macho, MD;  Location: [facility];  Service: General;  Laterality: Right;   LAPAROSCOPIC PARTIAL COLECTOMY N/A  06/11/2013   Procedure: LAPAROSCOPIC HAND ASSISTED PARTIAL COLECTOMY;  Surgeon: Dalia Heading, MD;  Location: [facility];  Service: General;  Laterality: N/A;   NASAL ENDOSCOPY WITH EPISTAXIS CONTROL Bilateral 02/11/2020   Procedure: NASAL ENDOSCOPY WITH EPISTAXIS CONTROL;  Surgeon: Newman Pies, MD;  Location: [facility];  Service: ENT;  Laterality: Bilateral;   right eye detached retina Bilateral    SPINAL FUSION  2016   YAG LASER APPLICATION Left 05/06/2014   Procedure: YAG LASER APPLICATION;  Surgeon: Susa Simmonds, MD;  Location: [facility];  Service: Ophthalmology;  Laterality: Left;   Patient Active Problem List   Diagnosis Date Noted   BPH associated with nocturia 05/24/2023    Lower urinary tract symptoms (LUTS) 05/24/2023   Benign prostatic hyperplasia (BPH) with urinary urgency 05/17/2023   Chronic asthma, severe persistent, uncomplicated 05/05/2023   Celiac artery stenosis (HCC) 02/02/2023   Chronic idiopathic constipation 12/21/2022   Osteoarthritis of knees, bilateral 04/14/2022   Irritable bowel syndrome 03/17/2022   Recurrent right lower quadrant abdominal pain 03/03/2022   Seasonal allergies 08/06/2021   COPD (chronic obstructive pulmonary disease) (HCC) 04/23/2021   CAD (coronary artery disease) 04/23/2021   Hypertension 09/16/2016   Lumbar spondylosis 11/05/2014   GERD (gastroesophageal reflux disease) 01/19/2012   Hyperlipidemia 04/15/2008   AAA (abdominal aortic aneurysm) (HCC) 04/15/2008    PCP: Tommie Sams, DO  REFERRING PROVIDER: Lisbeth Renshaw, MD  REFERRING DIAG: [phone] (ICD-10-CM) - Cervical radiculopathy  THERAPY DIAG:  No diagnosis found.  Rationale for Evaluation and Treatment: Rehabilitation  ONSET DATE: ***  SUBJECTIVE:                                                                                                                                                                                                         SUBJECTIVE STATEMENT: *** Hand dominance: {MISC; OT HAND DOMINANCE:[id number]}  PERTINENT HISTORY:  ***  PAIN:  Are you having pain? {OPRCPAIN:27236}  PRECAUTIONS: {Therapy precautions:24002}  RED FLAGS: {PT Red Flags:29287}     WEIGHT BEARING RESTRICTIONS: {Yes ***/No:24003}  FALLS:  Has patient fallen in last 6 months? {fallsyesno:27318}  LIVING ENVIRONMENT: Lives with: {OPRC lives with:25569::"lives with their family"} Lives in: {Lives in:25570} Stairs: {opstairs:27293} Has following equipment at home: {Assistive devices:23999}  OCCUPATION: ***  PLOF: {PLOF:24004}  PATIENT GOALS: ***  NEXT MD VISIT: ***  OBJECTIVE:  Note: Objective measures were completed at Evaluation unless  otherwise noted.  DIAGNOSTIC FINDINGS:  ***  PATIENT SURVEYS:  {rehab surveys:24030}  COGNITION: Overall cognitive status: {cognition:24006}  SENSATION: {sensation:27233}  POSTURE: {  posture:25561}  PALPATION: ***   CERVICAL ROM:   {AROM/PROM:27142} ROM A/PROM (deg) eval  Flexion   Extension   Right lateral flexion   Left lateral flexion   Right rotation   Left rotation    (Blank rows = not tested)  UPPER EXTREMITY ROM:  {AROM/PROM:27142} ROM Right eval Left eval  Shoulder flexion    Shoulder extension    Shoulder abduction    Shoulder adduction    Shoulder extension    Shoulder internal rotation    Shoulder external rotation    Elbow flexion    Elbow extension    Wrist flexion    Wrist extension    Wrist ulnar deviation    Wrist radial deviation    Wrist pronation    Wrist supination     (Blank rows = not tested)  UPPER EXTREMITY MMT:  MMT Right eval Left eval  Shoulder flexion    Shoulder extension    Shoulder abduction    Shoulder adduction    Shoulder extension    Shoulder internal rotation    Shoulder external rotation    Middle trapezius    Lower trapezius    Elbow flexion    Elbow extension    Wrist flexion    Wrist extension    Wrist ulnar deviation    Wrist radial deviation    Wrist pronation    Wrist supination    Grip strength     (Blank rows = not tested)  CERVICAL SPECIAL TESTS:  {Cervical special tests:25246}  FUNCTIONAL TESTS:  {Functional tests:24029}  TREATMENT DATE: 07/08/23 physical therapy evaluation and HEP instruction                                                                                                                                 PATIENT EDUCATION:  Education details: Patient educated on exam findings, POC, scope of PT, HEP, and ***. Person educated: Patient Education method: Explanation, Demonstration, and Handouts Education comprehension: verbalized understanding, returned demonstration,  verbal cues required, and tactile cues required HOME EXERCISE PROGRAM: ***  ASSESSMENT:  CLINICAL IMPRESSION: Patient is a 83 y.o. male who was seen today for physical therapy evaluation and treatment for M54.12 (ICD-10-CM) - Cervical radiculopathy.   OBJECTIVE IMPAIRMENTS: {opptimpairments:25111}.   ACTIVITY LIMITATIONS: {activitylimitations:27494}  PARTICIPATION LIMITATIONS: {participationrestrictions:25113}  PERSONAL FACTORS: {Personal factors:25162} are also affecting patient's functional outcome.   REHAB POTENTIAL: Good  CLINICAL DECISION MAKING: Evolving/moderate complexity  EVALUATION COMPLEXITY: Moderate   GOALS: Goals reviewed with patient? No  SHORT TERM GOALS: Target date: ***  *** Baseline:  Goal status: INITIAL  2.  *** Baseline:  Goal status: INITIAL  3.  *** Baseline:  Goal status: INITIAL  4.  *** Baseline:  Goal status: INITIAL  5.  *** Baseline:  Goal status: INITIAL  6.  *** Baseline:  Goal status: INITIAL  LONG TERM GOALS: Target date: ***  *** Baseline:  Goal status: INITIAL  2.  ***  Baseline:  Goal status: INITIAL  3.  *** Baseline:  Goal status: INITIAL  4.  *** Baseline:  Goal status: INITIAL  5.  *** Baseline:  Goal status: INITIAL  6.  *** Baseline:  Goal status: INITIAL   PLAN:  PT FREQUENCY: {rehab frequency:25116}  PT DURATION: {rehab duration:25117}  PLANNED INTERVENTIONS: 97164- PT Re-evaluation, 97110-Therapeutic exercises, 97530- Therapeutic activity, 97112- Neuromuscular re-education, 97535- Self Care, 19147- Manual therapy, [id number]- Gait training, [id number]- Orthotic Fit/training, [phone]- Canalith repositioning, [id number]- Aquatic Therapy, [phone]- Splinting, Patient/Family education, Balance training, Stair training, Taping, Dry Needling, Joint mobilization, Joint manipulation, Spinal manipulation, Spinal mobilization, Scar mobilization, and DME instructions.   PLAN FOR NEXT SESSION: Review HEP and  goals   2:53 PM, 07/07/23 Kristion Holifield [place] MPT [facility] [place] [phone]

## 2023-07-07 ENCOUNTER — Emergency Department (HOSPITAL_COMMUNITY): Payer: Medicare Other

## 2023-07-07 ENCOUNTER — Encounter (HOSPITAL_COMMUNITY): Payer: Self-pay | Admitting: Emergency Medicine

## 2023-07-07 ENCOUNTER — Other Ambulatory Visit: Payer: Self-pay

## 2023-07-07 ENCOUNTER — Emergency Department (HOSPITAL_COMMUNITY)
Admission: EM | Admit: 2023-07-07 | Discharge: 2023-07-07 | Disposition: A | Discharge status: Home / Self Care | Payer: Medicare Other | Attending: Emergency Medicine | Admitting: Emergency Medicine

## 2023-07-07 DIAGNOSIS — K469 Unspecified abdominal hernia without obstruction or gangrene: Secondary | ICD-10-CM | POA: Diagnosis not present

## 2023-07-07 DIAGNOSIS — B356 Tinea cruris: Secondary | ICD-10-CM

## 2023-07-07 DIAGNOSIS — K5750 Diverticulosis of both small and large intestine without perforation or abscess without bleeding: Secondary | ICD-10-CM | POA: Diagnosis not present

## 2023-07-07 DIAGNOSIS — I7143 Infrarenal abdominal aortic aneurysm, without rupture: Secondary | ICD-10-CM | POA: Diagnosis not present

## 2023-07-07 DIAGNOSIS — Z79899 Other long term (current) drug therapy: Secondary | ICD-10-CM | POA: Diagnosis not present

## 2023-07-07 DIAGNOSIS — N200 Calculus of kidney: Secondary | ICD-10-CM | POA: Diagnosis not present

## 2023-07-07 DIAGNOSIS — I700 Atherosclerosis of aorta: Secondary | ICD-10-CM | POA: Diagnosis not present

## 2023-07-07 DIAGNOSIS — R319 Hematuria, unspecified: Secondary | ICD-10-CM

## 2023-07-07 DIAGNOSIS — R58 Hemorrhage, not elsewhere classified: Secondary | ICD-10-CM | POA: Diagnosis not present

## 2023-07-07 DIAGNOSIS — N281 Cyst of kidney, acquired: Secondary | ICD-10-CM | POA: Diagnosis not present

## 2023-07-07 LAB — URINALYSIS, ROUTINE W REFLEX MICROSCOPIC
Bilirubin Urine: NEGATIVE
Glucose, UA: NEGATIVE mg/dL
Hgb urine dipstick: NEGATIVE
Ketones, ur: NEGATIVE mg/dL
Leukocytes,Ua: NEGATIVE
Nitrite: NEGATIVE
Protein, ur: NEGATIVE mg/dL
Specific Gravity, Urine: 1.012 (ref 1.005–1.030)
pH: 7 (ref 5.0–8.0)

## 2023-07-07 LAB — CBC WITH DIFFERENTIAL/PLATELET
Abs Immature Granulocytes: 0.03 10*3/uL (ref 0.00–0.07)
Basophils Absolute: 0.1 10*3/uL (ref 0.0–0.1)
Basophils Relative: 1 %
Eosinophils Absolute: 0.2 10*3/uL (ref 0.0–0.5)
Eosinophils Relative: 3 %
HCT: 44.8 % (ref 39.0–52.0)
Hemoglobin: 14.3 g/dL (ref 13.0–17.0)
Immature Granulocytes: 0 %
Lymphocytes Relative: 15 %
Lymphs Abs: 1.1 10*3/uL (ref 0.7–4.0)
MCH: 28.7 pg (ref 26.0–34.0)
MCHC: 31.9 g/dL (ref 30.0–36.0)
MCV: 90 fL (ref 80.0–100.0)
Monocytes Absolute: 0.7 10*3/uL (ref 0.1–1.0)
Monocytes Relative: 9 %
Neutro Abs: 5.2 10*3/uL (ref 1.7–7.7)
Neutrophils Relative %: 72 %
Platelets: 232 10*3/uL (ref 150–400)
RBC: 4.98 MIL/uL (ref 4.22–5.81)
RDW: 14.2 % (ref 11.5–15.5)
WBC: 7.3 10*3/uL (ref 4.0–10.5)
nRBC: 0 % (ref 0.0–0.2)

## 2023-07-07 LAB — COMPREHENSIVE METABOLIC PANEL
ALT: 17 U/L (ref 0–44)
AST: 22 U/L (ref 15–41)
Albumin: 3.8 g/dL (ref 3.5–5.0)
Alkaline Phosphatase: 56 U/L (ref 38–126)
Anion gap: 8 (ref 5–15)
BUN: 10 mg/dL (ref 8–23)
CO2: 31 mmol/L (ref 22–32)
Calcium: 9.5 mg/dL (ref 8.9–10.3)
Chloride: 102 mmol/L (ref 98–111)
Creatinine, Ser: 0.86 mg/dL (ref 0.61–1.24)
GFR, Estimated: >60 mL/min (ref >60)
Glucose, Bld: 129 mg/dL — ABNORMAL HIGH (ref 70–99)
Potassium: 4.1 mmol/L (ref 3.5–5.1)
Sodium: 141 mmol/L (ref 135–145)
Total Bilirubin: 0.9 mg/dL (ref 0.0–1.2)
Total Protein: 7.4 g/dL (ref 6.5–8.1)

## 2023-07-07 LAB — PROTIME-INR
INR: 1 (ref 0.8–1.2)
Prothrombin Time: 13.4 s (ref 11.4–15.2)

## 2023-07-07 MED ORDER — IOHEXOL 300 MG/ML  SOLN
100.0000 mL | Freq: Once | INTRAMUSCULAR | Status: AC | PRN
  Administered 2023-07-07: 100 mL via INTRAVENOUS

## 2023-07-07 MED ORDER — IPRATROPIUM-ALBUTEROL 0.5-2.5 (3) MG/3ML IN SOLN
3.0000 mL | Freq: Once | RESPIRATORY_TRACT | Status: AC
  Administered 2023-07-07: 3 mL via RESPIRATORY_TRACT
  Filled 2023-07-07: qty 3

## 2023-07-07 MED ORDER — CLOTRIMAZOLE 1 % EX CREA
TOPICAL_CREAM | CUTANEOUS | 0 refills | Status: DC
Start: 2023-07-07 — End: 2023-10-27

## 2023-07-07 NOTE — ED Provider Triage Note (Signed)
Emergency Medicine Provider Triage Evaluation Note  [name] [name] , a 83 y.o. male  was evaluated in triage.  Pt complains of dizziness, hematuria, dysuria.  Patient notes that he is currently on treatment for prostatitis at this time with Cipro and notes that he is currently on his 11th day.  Review of Systems  Positive: Hematuria, dizziness Negative: Abdominal pain, flank pain  Physical Exam  BP 128/84 (BP Location: Right Arm)   Pulse 84   Temp 97.6 F (36.4 C) (Oral)   Resp 16   Ht 6\' 1"  (1.854 m)   Wt 93 kg   SpO2 97%   BMI 27.05 kg/m  Gen:   Awake, no distress   Resp:  Normal effort  MSK:   Moves extremities without difficulty  Other:  Nontender palpation throughout abdomen  Medical Decision Making  Medically screening exam initiated at 10:24 AM.  Appropriate orders placed.  [name] [name] was informed that the remainder of the evaluation will be completed by another provider, this initial triage assessment does not replace that evaluation, and the importance of remaining in the ED until their evaluation is complete.  Lab work and urinalysis has been ordered.  Patient has benign abdominal exam at this point.  Awaiting bed in the back.   Lelon Perla, PA-C 07/07/23 1734

## 2023-07-07 NOTE — ED Provider Notes (Signed)
[facility] AT [facility] Provider Note   CSN: [id number] Arrival date & time: 07/07/23  7829     History  Chief Complaint  Patient presents with   Hematuria    Currently has an infected prostate    [name] [name] is a 83 y.o. male.  Patient is a 83 year old male who presents to Emergency Department with a chief complaint of hematuria which began this morning.  Patient notes that he is currently followed by urology and notes that he is undergoing treatment for prostatitis at this point.  He notes that he is currently on ciprofloxacin for this and has been compliant with it and notes that he has taken approximately 11 days.  Patient notes that he has had no associate abdominal pain, flank pain.  He does admit to subjective fevers this morning as well as associated lightheadedness.  He denies any associated diarrhea, constipation.  He notes that he does have a history of an aortic aneurysm which she is due to have followed up on next week.   Hematuria       Home Medications Prior to Admission medications   Medication Sig Start Date End Date Taking? Authorizing Provider  acetaminophen (TYLENOL) 500 MG tablet Take 500 mg by mouth every 6 (six) hours as needed.    [provider]  albuterol (PROVENTIL) (2.5 MG/3ML) 0.083% nebulizer solution Take 3 mLs (2.5 mg total) by nebulization every 6 (six) hours as needed for wheezing or shortness of breath. 09/10/22   Olalere, Adewale A, MD  albuterol (VENTOLIN HFA) 108 (90 Base) MCG/ACT inhaler Inhale 2 puffs into the lungs every 4 (four) hours as needed for wheezing or shortness of breath. 03/19/23   Durwin Glaze, MD  alfuzosin (UROXATRAL) 10 MG 24 hr tablet Take 1 tablet (10 mg total) by mouth daily with breakfast. 05/30/23   McKenzie, Mardene Celeste, MD  azithromycin (ZITHROMAX) 250 MG tablet Take 2 on day one then 1 daily x 4 days 05/05/23   Nyoka Cowden, MD  cefpodoxime (VANTIN) 100 MG tablet Take 1 tablet  (100 mg total) by mouth 2 (two) times daily. 06/27/23   McKenzie, Mardene Celeste, MD  cetirizine (ZYRTEC) 10 MG tablet Take 10 mg by mouth daily.    [provider]  COMBIGAN 0.2-0.5 % ophthalmic solution Apply 1 drop to eye 2 (two) times daily. 10/08/20   [provider]  esomeprazole (NEXIUM) 20 MG capsule Take 20 mg by mouth daily.    [provider]  fluticasone-salmeterol (ADVAIR HFA) 230-21 MCG/ACT inhaler Inhale 2 puffs into the lungs 2 (two) times daily. 05/09/23   Nyoka Cowden, MD  guaiFENesin (MUCINEX) 600 MG 12 hr tablet Take by mouth 2 (two) times daily.    [provider]  LORazepam (ATIVAN) 0.5 MG tablet TAKE 1 TABLET BY MOUTH ONCE DAILY AT BEDTIME AS NEEDED 05/02/23   Everlene Other G, DO  lovastatin (MEVACOR) 40 MG tablet Take 1 tablet (40 mg total) by mouth daily. 06/30/23   Tommie Sams, DO  nystatin (MYCOSTATIN) 100000 UNIT/ML suspension Take 5 mLs (500,000 Units total) by mouth 4 (four) times daily. Swish and swallow. 06/16/23   Tommie Sams, DO  oxymetazoline (AFRIN) 0.05 % nasal spray Place 1 spray into both nostrils 2 (two) times daily.    [provider]  Spacer/Aero-Holding Rudean Curt Use as directed 02/17/22   Tomma Lightning, MD      Allergies    Bactrim [sulfamethoxazole-trimethoprim],  Other, Beta adrenergic blockers, Lasix [furosemide], Doxycycline, Myrbetriq [mirabegron], Penicillin g, Penicillin g sodium, Ciprofloxacin, Dexamethasone, Gabapentin, Methocarbamol, Neomycin, Penicillins, Sulfamethoxazole, and Tetracyclines & related    Review of Systems   Review of Systems  Genitourinary:  Positive for hematuria.  All other systems reviewed and are negative.   Physical Exam Updated Vital Signs BP 128/84 (BP Location: Right Arm)   Pulse 84   Temp 97.6 F (36.4 C) (Oral)   Resp 16   Ht 6\' 1"  (1.854 m)   Wt 93 kg   SpO2 97%   BMI 27.05 kg/m  Physical Exam Vitals reviewed.  Constitutional:      Appearance: Normal  appearance.  HENT:     Head: Normocephalic and atraumatic.     Nose: Nose normal.     Mouth/Throat:     Mouth: Mucous membranes are moist.  Eyes:     Extraocular Movements: Extraocular movements intact.     Conjunctiva/sclera: Conjunctivae normal.     Pupils: Pupils are equal, round, and reactive to light.  Cardiovascular:     Rate and Rhythm: Normal rate and regular rhythm.     Pulses: Normal pulses.     Heart sounds: Normal heart sounds.  Pulmonary:     Effort: Pulmonary effort is normal. No respiratory distress.     Breath sounds: Normal breath sounds. No stridor. No wheezing, rhonchi or rales.  Abdominal:     General: Abdomen is flat. Bowel sounds are normal. There is no distension.     Palpations: Abdomen is soft.     Tenderness: There is no abdominal tenderness. There is no right CVA tenderness, left CVA tenderness or guarding.     Hernia: A hernia is present.  Musculoskeletal:        General: Normal range of motion.     Cervical back: Normal range of motion and neck supple.  Skin:    General: Skin is warm and dry.  Neurological:     General: No focal deficit present.     Mental Status: He is alert and oriented to person, place, and time. Mental status is at baseline.  Psychiatric:        Mood and Affect: Mood normal.        Behavior: Behavior normal.        Thought Content: Thought content normal.        Judgment: Judgment normal.     ED Results / Procedures / Treatments   Labs (all labs ordered are listed, but only abnormal results are displayed) Labs Reviewed  CBC WITH DIFFERENTIAL/PLATELET  URINALYSIS, ROUTINE W REFLEX MICROSCOPIC  COMPREHENSIVE METABOLIC PANEL  PROTIME-INR    EKG None  Radiology No results found.  Procedures Procedures    Medications Ordered in ED Medications - No data to display  ED Course/ Medical Decision Making/ A&P                                 Medical Decision Making This patient presents to the ED for concern of  hematuria differential diagnosis includes ureteral stone, malignancy, urinary tract infection, prostatitis    Additional history obtained:  Additional history obtained from medical records External records from outside source obtained and reviewed including urology notes   Lab Tests:  I Ordered, and personally interpreted labs.  The pertinent results include: No hematuria, no anemia, no leukocytosis   Imaging Studies ordered:  I ordered imaging studies including CT scan  of the abdomen and pelvis I independently visualized and interpreted imaging which showed no ureteral stone, mild enlargement of his aortic aneurysm I agree with the radiologist interpretation   Medicines ordered and prescription drug management:  I ordered medication including clotrimazole for tinea cruris Reevaluation of the patient after these medicines showed that the patient improved I have reviewed the patients home medicines and have made adjustments as needed   Problem List / ED Course:  Patient is doing well at this time and is stable for discharge home.  Discussed with patient CT scan of the abdomen and pelvis demonstrated no signs of ureteral stone.  He does have a mild enlargement of his aortic aneurysm but is asymptomatic in this regard at this time with no abdominal pain, tenderness, numbness or paresthesias to lower extremities.  He already has follow-up with his vascular surgeon next week.  Patient also has follow-up with his urologist tomorrow.  Urinalysis is unremarkable at this point with no demonstrated hematuria and he is urinating at the bedside with no further gross hematuria.  He has no indication for urinary tract infection or pyelonephritis at this time.  The importance of continued close follow-up with vascular surgery and urology was discussed.  Do not suspect any further management is warranted at this time.  Physical exam findings are concerning for possible tinea cruris and will treat with  clotrimazole at this point.  He has no indication for Fournier's gangrene or cellulitis of the perineum.  Strict return precautions were discussed for any new or worsening symptoms.  Patient voiced understanding and had no additional questions.   Social Determinants of Health: None       Amount and/or Complexity of Data Reviewed Labs: ordered. Radiology: ordered.  Risk Prescription drug management.           Final Clinical Impression(s) / ED Diagnoses Final diagnoses:  None    Rx / DC Orders ED Discharge Orders     None         Lelon Perla, PA-C 07/07/23 1506    Pricilla Loveless, MD 07/08/23 [phone]

## 2023-07-07 NOTE — ED Triage Notes (Signed)
Pt currently being treated for an infected prostate with cipro. This morning when urinating pt reported bright red blood in urine. Pt urinated again some time later with no blood, but there was blood in his underwear. Pt denies pain during urination, but has experienced burning. Pt reports his scrotum has been itching and his penis looks swollen.

## 2023-07-07 NOTE — Discharge Instructions (Signed)
Follow-up closely with urology and vascular surgery as scheduled.  Return to emergency department immediately for any new or worsening symptoms.

## 2023-07-08 ENCOUNTER — Ambulatory Visit: Payer: Medicare Other | Admitting: Urology

## 2023-07-08 ENCOUNTER — Ambulatory Visit (HOSPITAL_COMMUNITY): Payer: Medicare Other

## 2023-07-08 VITALS — BP 138/89 | HR 82

## 2023-07-08 DIAGNOSIS — R310 Gross hematuria: Secondary | ICD-10-CM

## 2023-07-08 DIAGNOSIS — R399 Unspecified symptoms and signs involving the genitourinary system: Secondary | ICD-10-CM

## 2023-07-08 DIAGNOSIS — N401 Enlarged prostate with lower urinary tract symptoms: Secondary | ICD-10-CM

## 2023-07-08 DIAGNOSIS — R351 Nocturia: Secondary | ICD-10-CM | POA: Diagnosis not present

## 2023-07-08 LAB — BLADDER SCAN AMB NON-IMAGING: Scan Result: 92

## 2023-07-08 MED ORDER — FINASTERIDE 5 MG PO TABS: 5.0000 mg | ORAL_TABLET | Freq: Every day | ORAL | 3 refills | Status: DC

## 2023-07-08 NOTE — Progress Notes (Addendum)
Bladder Scan completed today.  Patient cannot void prior to the bladder scan. Bladder scan result: 92 ml  Performed By: Guss Bunde, CMA  Additional notes-  MD to see after

## 2023-07-08 NOTE — Progress Notes (Signed)
 07/08/2023 10:29 AM   [name] [name] October 25, 1940 [id number]  Referring provider: Tommie Sams, DO [address] [name],  [place] [address]  Gross hematuria   HPI: [name] is a 83yo here for evaluation of gross hematuria. He was seen in the ER with gross hematuria. CT negative. UA shows blood. No culture was sent. He is currently being treated with vantin for prostatitis. Gross hematuria resolved after 2 episodes. No significant LUTS.   PMH: Past Medical History:  Diagnosis Date   AAA (abdominal aortic aneurysm) (HCC)    needs yearly ultrasound   Allergy    Anemia    Arthritis    Asthma    BCC (basal cell carcinoma) 08/18/1989   left shoulder blad, upper right arm, left arm beyond elbow, c&d   BCC (basal cell carcinoma) 01/31/1992   Posterior neck, curetx3, 33fu   BCC (basal cell carcinoma) 11/22/2001   mid forehead, cx3, excision, right forearm, cx3, 68fu   BCC (basal cell carcinoma) 10/09/2003   mid forehead, MOHs   BCC (basal cell carcinoma) 08/15/2008   upper left back, biopsy   BPH (benign prostatic hyperplasia)    CAD (coronary artery disease)    Cancer (HCC)    skin cancer   COPD (chronic obstructive pulmonary disease) (HCC)    Dysrhythmia    pt. states it can be fast at times   GERD (gastroesophageal reflux disease)    Glaucoma    History of acute pancreatitis 12/21/2022   HOH (hard of hearing)    Hypercholesterolemia    Hypertension    Impaired fasting glucose    Low back pain    Melanoma in situ (HCC) 10/09/2003   left chin, MOHs   MI (myocardial infarction) (HCC) 1999   SCC (squamous cell carcinoma) 07/03/2014   in situ, behind left ear, cx3, cautery, 19fu   SCC (squamous cell carcinoma) 07/03/2014   well diff, left forearm, biopsy, cx1, cautery   SCC (squamous cell carcinoma) 07/20/2017   in situ, left upper arm, cx3, 85fu   SCC (squamous cell carcinoma) 01/10/2019   in situ, left post shoulder, cx3, 93fu   SCC (squamous cell carcinoma)  11/22/2001   left forearm distal, left forearm, cx3, 103fu   SCC (squamous cell carcinoma) 10/09/2003   [place], left ear post, clear per st, right cheek clear   SCC (squamous cell carcinoma) 03/30/2004   in situ, left upper arm, cx3, 49fu   SCC (squamous cell carcinoma) 03/08/2005   in situ, right cheek, mid upper forehead, cx3, 44fu   SCC (squamous cell carcinoma) 06/08/2006   in situ, left shoulder, cx3, 65fu   SCC (squamous cell carcinoma) 05/05/2010   right inner wrist, biopsy   SCC (squamous cell carcinoma) 09/13/2013   in situ, right crown scalp, front scalp, biopsy   Thrush     Surgical History: Past Surgical History:  Procedure Laterality Date   BACK SURGERY     x 3   CARDIAC CATHETERIZATION     angioplasty   CATARACT EXTRACTION W/PHACO  03/20/2012   Procedure: CATARACT EXTRACTION PHACO AND INTRAOCULAR LENS PLACEMENT (IOC);  Surgeon: [name] Simmonds, MD;  Location: [facility];  Service: Ophthalmology;  Laterality: Right;  CDE:  8.45   CATARACT EXTRACTION W/PHACO Left 04/02/2013   Procedure: CATARACT EXTRACTION PHACO AND INTRAOCULAR LENS PLACEMENT (IOC);  Surgeon: [name] Simmonds, MD;  Location: [facility];  Service: Ophthalmology;  Laterality: Left;  CDE:  6.50   CHOLECYSTECTOMY  2000  COLONOSCOPY  2009   repeat 5 years   ESOPHAGOGASTRODUODENOSCOPY     HERNIA REPAIR Left    inguinal   INGUINAL HERNIA REPAIR Right 03/28/2020   Procedure: Right Inguinal Herniorrhaphy with Mesh;  Surgeon: [name] Macho, MD;  Location: [facility];  Service: General;  Laterality: Right;   LAPAROSCOPIC PARTIAL COLECTOMY N/A 06/11/2013   Procedure: LAPAROSCOPIC HAND ASSISTED PARTIAL COLECTOMY;  Surgeon: [name] Heading, MD;  Location: [facility];  Service: General;  Laterality: N/A;   NASAL ENDOSCOPY WITH EPISTAXIS CONTROL Bilateral 02/11/2020   Procedure: NASAL ENDOSCOPY WITH EPISTAXIS CONTROL;  Surgeon: [name] Pies, MD;  Location: [facility];  Service: ENT;  Laterality: Bilateral;   right eye  detached retina Bilateral    SPINAL FUSION  2016   YAG LASER APPLICATION Left 05/06/2014   Procedure: YAG LASER APPLICATION;  Surgeon: [name] Simmonds, MD;  Location: [facility];  Service: Ophthalmology;  Laterality: Left;    Home Medications:  Allergies as of 07/08/2023       Reactions   Bactrim [sulfamethoxazole-trimethoprim] Hives   Other Anaphylaxis, Other (See Comments)   Beta Adrenergic Blockers Diarrhea, Other (See Comments)   Does not recall an allergy   Lasix [furosemide] Rash   Doxycycline Swelling   Lips swelling and skin peeling around mouth   Myrbetriq [mirabegron] Hives, Itching   Penicillin G    Other reaction(s): Unknown   Penicillin G Sodium    Other reaction(s): Unknown   Ciprofloxacin Nausea And Vomiting, Rash, Other (See Comments)   Body aches   Dexamethasone Swelling   Gabapentin Swelling   Methocarbamol Swelling, Rash   Neomycin Other (See Comments)   Other reaction(s): Unknown   Penicillins Swelling, Rash   Has patient had a PCN reaction causing immediate rash, facial/tongue/throat swelling, SOB or lightheadedness with hypotension: yes Has patient had a PCN reaction causing severe rash involving mucus membranes or skin necrosis: unknown Has patient had a PCN reaction that required hospitalization: no Has patient had a PCN reaction occurring within the last 10 years: No If all of the above answers are "NO", then may proceed with Cephalosporin use.   Sulfamethoxazole Rash   Tetracyclines & Related Itching        Medication List        Accurate as of July 08, 2023 10:29 AM. If you have any questions, ask your nurse or doctor.          acetaminophen 500 MG tablet Commonly known as: TYLENOL Take 500 mg by mouth every 6 (six) hours as needed.   albuterol (2.5 MG/3ML) 0.083% nebulizer solution Commonly known as: PROVENTIL Take 3 mLs (2.5 mg total) by nebulization every 6 (six) hours as needed for wheezing or shortness of breath.   albuterol  108 (90 Base) MCG/ACT inhaler Commonly known as: VENTOLIN HFA Inhale 2 puffs into the lungs every 4 (four) hours as needed for wheezing or shortness of breath.   alfuzosin 10 MG 24 hr tablet Commonly known as: UROXATRAL Take 1 tablet (10 mg total) by mouth daily with breakfast.   azithromycin 250 MG tablet Commonly known as: ZITHROMAX Take 2 on day one then 1 daily x 4 days   cefpodoxime 100 MG tablet Commonly known as: VANTIN Take 1 tablet (100 mg total) by mouth 2 (two) times daily.   cetirizine 10 MG tablet Commonly known as: ZYRTEC Take 10 mg by mouth daily.   clotrimazole 1 % cream Commonly known as: LOTRIMIN Apply to affected area 2 times  daily   Combigan 0.2-0.5 % ophthalmic solution Generic drug: brimonidine-timolol Apply 1 drop to eye 2 (two) times daily.   esomeprazole 20 MG capsule Commonly known as: NEXIUM Take 20 mg by mouth daily.   fluticasone-salmeterol 230-21 MCG/ACT inhaler Commonly known as: Advair HFA Inhale 2 puffs into the lungs 2 (two) times daily.   guaiFENesin 600 MG 12 hr tablet Commonly known as: MUCINEX Take by mouth 2 (two) times daily.   LORazepam 0.5 MG tablet Commonly known as: ATIVAN TAKE 1 TABLET BY MOUTH ONCE DAILY AT BEDTIME AS NEEDED   lovastatin 40 MG tablet Commonly known as: MEVACOR Take 1 tablet (40 mg total) by mouth daily.   nystatin 100000 UNIT/ML suspension Commonly known as: MYCOSTATIN Take 5 mLs (500,000 Units total) by mouth 4 (four) times daily. Swish and swallow.   oxymetazoline 0.05 % nasal spray Commonly known as: AFRIN Place 1 spray into both nostrils 2 (two) times daily.   Spacer/Aero-Holding [employer] Use as directed        Allergies:  Allergies  Allergen Reactions   Bactrim [Sulfamethoxazole-Trimethoprim] Hives   Other Anaphylaxis and Other (See Comments)   Beta Adrenergic Blockers Diarrhea and Other (See Comments)    Does not recall an allergy   Lasix [Furosemide] Rash   Doxycycline  Swelling    Lips swelling and skin peeling around mouth   Myrbetriq [Mirabegron] Hives and Itching   Penicillin G     Other reaction(s): Unknown   Penicillin G Sodium     Other reaction(s): Unknown   Ciprofloxacin Nausea And Vomiting, Rash and Other (See Comments)    Body aches    Dexamethasone Swelling   Gabapentin Swelling   Methocarbamol Swelling and Rash   Neomycin Other (See Comments)    Other reaction(s): Unknown   Penicillins Swelling and Rash    Has patient had a PCN reaction causing immediate rash, facial/tongue/throat swelling, SOB or lightheadedness with hypotension: yes Has patient had a PCN reaction causing severe rash involving mucus membranes or skin necrosis: unknown Has patient had a PCN reaction that required hospitalization: no Has patient had a PCN reaction occurring within the last 10 years: No If all of the above answers are "NO", then may proceed with Cephalosporin use.    Sulfamethoxazole Rash   Tetracyclines & Related Itching    Family History: Family History  Problem Relation Age of Onset   Hypertension Mother    COPD Father    Cancer Brother        brain    Social History:  reports that he quit smoking about 20 years ago. His smoking use included cigarettes. He started smoking about 55 years ago. He has a 35 pack-year smoking history. He has never used smokeless tobacco. He reports that he does not drink alcohol and does not use drugs.  ROS: All other review of systems were reviewed and are negative except what is noted above in HPI  Physical Exam: BP 138/89   Pulse 82   Constitutional:  Alert and oriented, No acute distress. HEENT: [place] AT, moist mucus membranes.  Trachea midline, no masses. Cardiovascular: No clubbing, cyanosis, or edema. Respiratory: Normal respiratory effort, no increased work of breathing. GI: Abdomen is soft, nontender, nondistended, no abdominal masses GU: No CVA tenderness.  Lymph: No cervical or inguinal  lymphadenopathy. Skin: No rashes, bruises or suspicious lesions. Neurologic: Grossly intact, no focal deficits, moving all 4 extremities. Psychiatric: Normal mood and affect.  Laboratory Data: Lab Results  Component Value  Date   WBC 7.3 07/07/2023   HGB 14.3 07/07/2023   HCT 44.8 07/07/2023   MCV 90.0 07/07/2023   PLT 232 07/07/2023    Lab Results  Component Value Date   CREATININE 0.86 07/07/2023    Lab Results  Component Value Date   PSA 1.80 07/26/2014   PSA 1.42 03/26/2013    No results found for: "TESTOSTERONE"  Lab Results  Component Value Date   HGBA1C 6.1 (H) 04/23/2021    Urinalysis    Component Value Date/Time   COLORURINE YELLOW 07/07/2023 1345   APPEARANCEUR CLEAR 07/07/2023 1345   APPEARANCEUR Clear 06/27/2023 1104   LABSPEC 1.012 07/07/2023 1345   PHURINE 7.0 07/07/2023 1345   GLUCOSEU NEGATIVE 07/07/2023 1345   HGBUR NEGATIVE 07/07/2023 1345   BILIRUBINUR NEGATIVE 07/07/2023 1345   BILIRUBINUR Negative 06/27/2023 1104   KETONESUR NEGATIVE 07/07/2023 1345   PROTEINUR NEGATIVE 07/07/2023 1345   UROBILINOGEN 0.2 03/29/2023 1512   UROBILINOGEN 0.2 12/04/2014 0240   NITRITE NEGATIVE 07/07/2023 1345   LEUKOCYTESUR NEGATIVE 07/07/2023 1345    Lab Results  Component Value Date   LABMICR Comment 06/27/2023   WBCUA None seen 04/30/2020   LABEPIT None seen 04/30/2020   BACTERIA None seen 04/30/2020    Pertinent Imaging:  Results for orders placed during the hospital encounter of 11/11/14  DG Abd 1 View  Narrative CLINICAL DATA:  Difficulty urinating with burning and pelvic pressure for 6 days.  EXAM: ABDOMEN - 1 VIEW  COMPARISON:  None.  FINDINGS: The bowel gas pattern is nonobstructive. Prominent stool burden in the transverse colon is noted. No unexpected abdominal calcification is seen. The patient is status post lower lumbar fusion.  IMPRESSION: No acute abnormality.   Electronically Signed By: Drusilla Kanner M.D. On:  11/11/2014 17:46  Results for orders placed in visit on 03/30/22  US Venous Img Lower Bilateral  Narrative CLINICAL DATA:  Bilateral lower extremity edema  EXAM: BILATERAL LOWER EXTREMITY VENOUS DOPPLER ULTRASOUND  TECHNIQUE: Gray-scale sonography with graded compression, as well as color Doppler and duplex ultrasound were performed to evaluate the lower extremity deep venous systems from the level of the common femoral vein and including the common femoral, femoral, profunda femoral, popliteal and calf veins including the posterior tibial, peroneal and gastrocnemius veins when visible. The superficial great saphenous vein was also interrogated. Spectral Doppler was utilized to evaluate flow at rest and with distal augmentation maneuvers in the common femoral, femoral and popliteal veins.  COMPARISON:  None Available.  FINDINGS: RIGHT LOWER EXTREMITY  Common Femoral Vein: No evidence of thrombus. Normal compressibility, respiratory phasicity and response to augmentation.  Saphenofemoral Junction: No evidence of thrombus. Normal compressibility and flow on color Doppler imaging.  Profunda Femoral Vein: No evidence of thrombus. Normal compressibility and flow on color Doppler imaging.  Femoral Vein: No evidence of thrombus. Normal compressibility, respiratory phasicity and response to augmentation.  Popliteal Vein: No evidence of thrombus. Normal compressibility, respiratory phasicity and response to augmentation.  Calf Veins: No evidence of thrombus. Normal compressibility and flow on color Doppler imaging.  Superficial Great Saphenous Vein: No evidence of thrombus. Normal compressibility.  Venous Reflux:  None.  Other Findings:  None.  LEFT LOWER EXTREMITY  Common Femoral Vein: No evidence of thrombus. Normal compressibility, respiratory phasicity and response to augmentation.  Saphenofemoral Junction: No evidence of thrombus. Normal compressibility and flow  on color Doppler imaging.  Profunda Femoral Vein: No evidence of thrombus. Normal compressibility and flow on color Doppler imaging.  Femoral Vein: No evidence of thrombus. Normal compressibility, respiratory phasicity and response to augmentation.  Popliteal Vein: No evidence of thrombus. Normal compressibility, respiratory phasicity and response to augmentation.  Calf Veins: No evidence of thrombus. Normal compressibility and flow on color Doppler imaging.  Superficial Great Saphenous Vein: No evidence of thrombus. Normal compressibility.  Venous Reflux:  None.  Other Findings:  None.  IMPRESSION: No evidence of deep venous thrombosis in either lower extremity.   Electronically Signed By: Malachy Moan M.D. On: 03/31/2022 09:45  No results found for this or any previous visit.  No results found for this or any previous visit.  No results found for this or any previous visit.  No results found for this or any previous visit.  No results found for this or any previous visit.  No results found for this or any previous visit.   Assessment & Plan:    1. Lower urinary tract symptoms (LUTS) (Primary) Continue vantin 200mg  BID for 21 days - BLADDER SCAN AMB NON-IMAGING  2. BPH associated with nocturia -start finasteride 5mg  daily  3. Gross hematuria Finasteride 5mg  daily   No follow-ups on file.  Wilkie Aye, MD  [facility] [place]

## 2023-07-11 NOTE — Progress Notes (Unsigned)
 Patient name: [name] [name] MRN: [id number] DOB: 12-13-1940 Sex: male  REASON FOR CONSULT: 71-month follow-up for 5.1 cm AAA  HPI: [name] [name] is a 83 y.o. male, with history of coronary artery disease status post MI, COPD, hypertension, hyperlipidemia that presents for 47-month follow-up of 5.1 cm AAA.  He was previously seen by Dr. Arbie Cookey on 07/21/2022 with a 5.43 cm abdominal aortic aneurysm.  Dr. Arbie Cookey discussed with him that his size for repair was "on the fence".  He recommended follow-up in 6 months with CTA.  I last saw him after CTA on 12/28/2022 showing a 5.1 cm AAA.  Today reports no new abdominal or back pain.  Did have a CT abdomen pelvis on 07/07/2023 for further workup of hematuria showing his aneurysm had increased to 6 cm.  Past Medical History:  Diagnosis Date   AAA (abdominal aortic aneurysm) (HCC)    needs yearly ultrasound   Allergy    Anemia    Arthritis    Asthma    BCC (basal cell carcinoma) 08/18/1989   left shoulder blad, upper right arm, left arm beyond elbow, c&d   BCC (basal cell carcinoma) 01/31/1992   Posterior neck, curetx3, 88fu   BCC (basal cell carcinoma) 11/22/2001   mid forehead, cx3, excision, right forearm, cx3, 55fu   BCC (basal cell carcinoma) 10/09/2003   mid forehead, MOHs   BCC (basal cell carcinoma) 08/15/2008   upper left back, biopsy   BPH (benign prostatic hyperplasia)    CAD (coronary artery disease)    Cancer (HCC)    skin cancer   COPD (chronic obstructive pulmonary disease) (HCC)    Dysrhythmia    pt. states it can be fast at times   GERD (gastroesophageal reflux disease)    Glaucoma    History of acute pancreatitis 12/21/2022   HOH (hard of hearing)    Hypercholesterolemia    Hypertension    Impaired fasting glucose    Low back pain    Melanoma in situ (HCC) 10/09/2003   left chin, MOHs   MI (myocardial infarction) (HCC) 1999   SCC (squamous cell carcinoma) 07/03/2014   in situ, behind left ear, cx3, cautery, 66fu   SCC  (squamous cell carcinoma) 07/03/2014   well diff, left forearm, biopsy, cx1, cautery   SCC (squamous cell carcinoma) 07/20/2017   in situ, left upper arm, cx3, 22fu   SCC (squamous cell carcinoma) 01/10/2019   in situ, left post shoulder, cx3, 43fu   SCC (squamous cell carcinoma) 11/22/2001   left forearm distal, left forearm, cx3, 96fu   SCC (squamous cell carcinoma) 10/09/2003   [place], left ear post, clear per st, right cheek clear   SCC (squamous cell carcinoma) 03/30/2004   in situ, left upper arm, cx3, 50fu   SCC (squamous cell carcinoma) 03/08/2005   in situ, right cheek, mid upper forehead, cx3, 32fu   SCC (squamous cell carcinoma) 06/08/2006   in situ, left shoulder, cx3, 58fu   SCC (squamous cell carcinoma) 05/05/2010   right inner wrist, biopsy   SCC (squamous cell carcinoma) 09/13/2013   in situ, right crown scalp, front scalp, biopsy   Thrush     Past Surgical History:  Procedure Laterality Date   BACK SURGERY     x 3   CARDIAC CATHETERIZATION     angioplasty   CATARACT EXTRACTION W/PHACO  03/20/2012   Procedure: CATARACT EXTRACTION PHACO AND INTRAOCULAR LENS PLACEMENT (IOC);  Surgeon: Susa Simmonds, MD;  Location:  [facility];  Service: Ophthalmology;  Laterality: Right;  CDE:  8.45   CATARACT EXTRACTION W/PHACO Left 04/02/2013   Procedure: CATARACT EXTRACTION PHACO AND INTRAOCULAR LENS PLACEMENT (IOC);  Surgeon: Susa Simmonds, MD;  Location: [facility];  Service: Ophthalmology;  Laterality: Left;  CDE:  6.50   CHOLECYSTECTOMY  2000   COLONOSCOPY  2009   repeat 5 years   ESOPHAGOGASTRODUODENOSCOPY     HERNIA REPAIR Left    inguinal   INGUINAL HERNIA REPAIR Right 03/28/2020   Procedure: Right Inguinal Herniorrhaphy with Mesh;  Surgeon: Franky Macho, MD;  Location: [facility];  Service: General;  Laterality: Right;   LAPAROSCOPIC PARTIAL COLECTOMY N/A 06/11/2013   Procedure: LAPAROSCOPIC HAND ASSISTED PARTIAL COLECTOMY;  Surgeon: Dalia Heading, MD;  Location: [facility];   Service: General;  Laterality: N/A;   NASAL ENDOSCOPY WITH EPISTAXIS CONTROL Bilateral 02/11/2020   Procedure: NASAL ENDOSCOPY WITH EPISTAXIS CONTROL;  Surgeon: Newman Pies, MD;  Location: [facility];  Service: ENT;  Laterality: Bilateral;   right eye detached retina Bilateral    SPINAL FUSION  2016   YAG LASER APPLICATION Left 05/06/2014   Procedure: YAG LASER APPLICATION;  Surgeon: Susa Simmonds, MD;  Location: [facility];  Service: Ophthalmology;  Laterality: Left;    Family History  Problem Relation Age of Onset   Hypertension Mother    COPD Father    Cancer Brother        brain    SOCIAL HISTORY: Social History   Socioeconomic History   Marital status: Widowed    Spouse name: Not on file   Number of children: Not on file   Years of education: Not on file   Highest education level: Not on file  Occupational History   Not on file  Tobacco Use   Smoking status: Former    Current packs/day: 0.00    Average packs/day: 1 pack/day for 35.0 years (35.0 ttl pk-yrs)    Types: Cigarettes    Start date: 03/27/1968    Quit date: 03/28/2003    Years since quitting: 20.3   Smokeless tobacco: Never  Vaping Use   Vaping status: Never Used  Substance and Sexual Activity   Alcohol use: No    Alcohol/week: 0.0 standard drinks of alcohol   Drug use: No   Sexual activity: Not Currently    Birth control/protection: None  Other Topics Concern   Not on file  Social History Narrative   Not on file   Social Drivers of Health   Financial Resource Strain: Low Risk  (03/18/2022)   Overall Financial Resource Strain (CARDIA)    Difficulty of Paying Living Expenses: Not very hard  Food Insecurity: No Food Insecurity (03/18/2022)   Hunger Vital Sign    Worried About Running Out of Food in the Last Year: Never true    Ran Out of Food in the Last Year: Never true  Transportation Needs: No Transportation Needs (03/18/2022)   PRAPARE - Administrator, Civil Service  (Medical): No    Lack of Transportation (Non-Medical): No  Physical Activity: Insufficiently Active (03/18/2022)   Exercise Vital Sign    Days of Exercise per Week: 2 days    Minutes of Exercise per Session: 20 min  Stress: No Stress Concern Present (03/18/2022)   [employer] of Occupational Health - Occupational Stress Questionnaire    Feeling of Stress : Not at all  Social Connections: Socially Isolated (03/18/2022)   Social Connection and Isolation Panel [  NHANES]    Frequency of Communication with Friends and Family: More than three times a week    Frequency of Social Gatherings with Friends and Family: Three times a week    Attends Religious Services: Never    Active Member of Clubs or Organizations: No    Attends Banker Meetings: Never    Marital Status: Widowed  Intimate Partner Violence: Not At Risk (03/18/2022)   Humiliation, Afraid, Rape, and Kick questionnaire    Fear of Current or Ex-Partner: No    Emotionally Abused: No    Physically Abused: No    Sexually Abused: No    Allergies  Allergen Reactions   Bactrim [Sulfamethoxazole-Trimethoprim] Hives   Other Anaphylaxis and Other (See Comments)   Beta Adrenergic Blockers Diarrhea and Other (See Comments)    Does not recall an allergy   Lasix [Furosemide] Rash   Doxycycline Swelling    Lips swelling and skin peeling around mouth   Myrbetriq [Mirabegron] Hives and Itching   Penicillin G     Other reaction(s): Unknown   Penicillin G Sodium     Other reaction(s): Unknown   Ciprofloxacin Nausea And Vomiting, Rash and Other (See Comments)    Body aches    Dexamethasone Swelling   Gabapentin Swelling   Methocarbamol Swelling and Rash   Neomycin Other (See Comments)    Other reaction(s): Unknown   Penicillins Swelling and Rash    Has patient had a PCN reaction causing immediate rash, facial/tongue/throat swelling, SOB or lightheadedness with hypotension: yes Has patient had a PCN reaction causing  severe rash involving mucus membranes or skin necrosis: unknown Has patient had a PCN reaction that required hospitalization: no Has patient had a PCN reaction occurring within the last 10 years: No If all of the above answers are "NO", then may proceed with Cephalosporin use.    Sulfamethoxazole Rash   Tetracyclines & Related Itching    Current Outpatient Medications  Medication Sig Dispense Refill   acetaminophen (TYLENOL) 500 MG tablet Take 500 mg by mouth every 6 (six) hours as needed.     albuterol (PROVENTIL) (2.5 MG/3ML) 0.083% nebulizer solution Take 3 mLs (2.5 mg total) by nebulization every 6 (six) hours as needed for wheezing or shortness of breath. 360 mL 5   albuterol (VENTOLIN HFA) 108 (90 Base) MCG/ACT inhaler Inhale 2 puffs into the lungs every 4 (four) hours as needed for wheezing or shortness of breath. 18 each 0   alfuzosin (UROXATRAL) 10 MG 24 hr tablet Take 1 tablet (10 mg total) by mouth daily with breakfast. 30 tablet 0   azithromycin (ZITHROMAX) 250 MG tablet Take 2 on day one then 1 daily x 4 days 6 tablet 5   cefpodoxime (VANTIN) 100 MG tablet Take 1 tablet (100 mg total) by mouth 2 (two) times daily. 56 tablet 0   cetirizine (ZYRTEC) 10 MG tablet Take 10 mg by mouth daily.     clotrimazole (LOTRIMIN) 1 % cream Apply to affected area 2 times daily 15 g 0   COMBIGAN 0.2-0.5 % ophthalmic solution Apply 1 drop to eye 2 (two) times daily.     esomeprazole (NEXIUM) 20 MG capsule Take 20 mg by mouth daily.     finasteride (PROSCAR) 5 MG tablet Take 1 tablet (5 mg total) by mouth daily. 90 tablet 3   fluticasone-salmeterol (ADVAIR HFA) 230-21 MCG/ACT inhaler Inhale 2 puffs into the lungs 2 (two) times daily.     guaiFENesin (MUCINEX) 600 MG 12 hr  tablet Take by mouth 2 (two) times daily.     LORazepam (ATIVAN) 0.5 MG tablet TAKE 1 TABLET BY MOUTH ONCE DAILY AT BEDTIME AS NEEDED 30 tablet 3   lovastatin (MEVACOR) 40 MG tablet Take 1 tablet (40 mg total) by mouth daily. 90  tablet 0   nystatin (MYCOSTATIN) 100000 UNIT/ML suspension Take 5 mLs (500,000 Units total) by mouth 4 (four) times daily. Swish and swallow. 140 mL 0   oxymetazoline (AFRIN) 0.05 % nasal spray Place 1 spray into both nostrils 2 (two) times daily.     Spacer/Aero-Holding Rudean Curt Use as directed 1 each 0   No current facility-administered medications for this visit.    REVIEW OF SYSTEMS:  [X]  denotes positive finding, [ ]  denotes negative finding Cardiac  Comments:  Chest pain or chest pressure:    Shortness of breath upon exertion:    Short of breath when lying flat:    Irregular heart rhythm:        Vascular    Pain in calf, thigh, or hip brought on by ambulation:    Pain in feet at night that wakes you up from your sleep:     Blood clot in your veins:    Leg swelling:         Pulmonary    Oxygen at home:    Productive cough:     Wheezing:         Neurologic    Sudden weakness in arms or legs:     Sudden numbness in arms or legs:     Sudden onset of difficulty speaking or slurred speech:    Temporary loss of vision in one eye:     Problems with dizziness:         Gastrointestinal    Blood in stool:     Vomited blood:         Genitourinary    Burning when urinating:     Blood in urine:        Psychiatric    Major depression:         Hematologic    Bleeding problems:    Problems with blood clotting too easily:        Skin    Rashes or ulcers:        Constitutional    Fever or chills:      PHYSICAL EXAM: There were no vitals filed for this visit.  GENERAL: The patient is a well-nourished male, in no acute distress. The vital signs are documented above. CARDIAC: There is a regular rate and rhythm.  VASCULAR:  Bilateral femoral pulses palpable Bilateral DP pulses palpable PULMONARY: No respiratory distress. ABDOMEN: Soft and non-tender. MUSCULOSKELETAL: There are no major deformities or cyanosis. NEUROLOGIC: No focal weakness or paresthesias are  detected. SKIN: There are no ulcers or rashes noted. PSYCHIATRIC: The patient has a normal affect.  DATA:   CT abdomen pelvis 07/07/2023 with 5.7 cm AAA by my measurement  CTA reviewed 12/28/22: 5.1 cm AAA by my measurement  Assessment/Plan:  83 y.o. male, with history of coronary artery disease status post MI, COPD, hypertension, hyperlipidemia that presents for 23-month follow-up of 5.1 cm AAA.  I discussed that his aneurysm has increased now measuring 5.7 cm in maximal diameter by my measurement  I discussed current guidelines are to recommend repair greater than 5.5 cm in men.  I would recommend repair.  Unfortunately he has significant amount of mural thrombus at the level of the renal  arteries they can make this technically difficult.  There is a neck that is quite long and I think the option would be using a traditional endograft that we will land short of the renal arteries and avoid the mural thrombus.  We should still get seal without getting close to the renal arteries with traditional endograft given long neck.  There would still be risk of embolization including into the renals causing renal insufficiency and/or dialysis and into the lower extremities and/or mesenteric arteries as we discussed today.  I discussed all the risk and benefits including risk of anesthesia including vessel injury sometimes requiring groin cutdown and open conversion as well as risk of MI and CVA.  Will get scheduled his convenience.  I discussed he has a 10 to 15% yearly rupture risk at current size.   Cephus Shelling, MD [facility] Office: [phone]

## 2023-07-12 ENCOUNTER — Encounter: Payer: Self-pay | Admitting: Urology

## 2023-07-12 ENCOUNTER — Encounter: Payer: Self-pay | Admitting: Vascular Surgery

## 2023-07-12 ENCOUNTER — Ambulatory Visit (HOSPITAL_COMMUNITY): Payer: Medicare Other

## 2023-07-12 ENCOUNTER — Ambulatory Visit (INDEPENDENT_AMBULATORY_CARE_PROVIDER_SITE_OTHER): Payer: Medicare Other | Admitting: Vascular Surgery

## 2023-07-12 VITALS — BP 156/94 | HR 68 | Temp 98.0°F | Resp 20 | Ht 73.0 in | Wt 211.4 lb

## 2023-07-12 DIAGNOSIS — I7143 Infrarenal abdominal aortic aneurysm, without rupture: Secondary | ICD-10-CM | POA: Diagnosis not present

## 2023-07-12 NOTE — Progress Notes (Signed)
 Patient ID: [name] [name], male   DOB: Dec 29, 1940, 83 y.o.   MRN: [id number]      83 y.o. history of AAA, CAD pancreatitis PCI RCA 1999 with low risk myovue 2018. Clinically stable Intolerant to beta blockers with diarrhea . AAA 4.6 cm by [place] 05/12/20 but now 5.1 cm by [place] 12/30/21 Has seen Dr Arbie Cookey and threshold for EVAR 5.5 cm   Wife passed a few  years ago of Pancreatic Cancer and only lasted 6 weeks form diagnosis Has daughter in [place]    Had idiopathic pancreatitis 83  Not a drinker has had GB removed Seen by Dr Dulce Sellar no etiology noted  No angina or cardiac complaints Former smoker quit in 2014 Doing cardiopulmonary rehab 2x/ week Has seen Dr Davonna Belling in past   He is active with some dyspnea from COPD Age and pulmonary status make him a poor candidate for open AAA repair AAA 5.3 cm on CTA 10/18;23  Dr Arbie Cookey "on fence" about impending need for intervention and suggested seeing him again in August  No abdominal pain or issues with AAA  Myovue 05/24/22 with fixed inferior defect no ischemia EF 45%  CT abdomen 07/07/23 another increase in size 4.7 x 6.0 cm. Seen by Dr Chestine Spore and mural thrombus near renals with risk of embolus but plans to schedule endo-graft repair 07/29/23  Needs to lose weight has small ventral hernia and repaired right inguinal hernia  ROS: Denies fever, malais, weight loss, blurry vision, decreased visual acuity, cough, sputum, SOB, hemoptysis, pleuritic pain, palpitaitons, heartburn, abdominal pain, melena, lower extremity edema, claudication, or rash.  All other systems reviewed and negative  General: BP 130/88   Pulse 86   Ht 6\' 1"  (1.854 m)   Wt 210 lb 3.2 oz (95.3 kg)   SpO2 95%   BMI 27.73 kg/m   Affect appropriate Healthy:  appears stated age HEENT: hard of hearing Aides in place  Neck supple with no adenopathy JVP normal no bruits no thyromegaly Lungs Mild exp wheezing and good diaphragmatic motion Heart:  S1/S2 no murmur, no rub, gallop or  click PMI normal Abdomen: benighn, BS positve, no tenderness, AAA palpable not tender  no bruit.  No HSM or HJR Post right hernia surgery repair but persistent  Umbilical hernia  Distal pulses intact with no bruits No edema Neuro non-focal Skin warm and dry No muscular weakness   Current Outpatient Medications  Medication Sig Dispense Refill   albuterol (PROVENTIL) (2.5 MG/3ML) 0.083% nebulizer solution Take 3 mLs (2.5 mg total) by nebulization every 6 (six) hours as needed for wheezing or shortness of breath. 360 mL 5   albuterol (VENTOLIN HFA) 108 (90 Base) MCG/ACT inhaler Inhale 2 puffs into the lungs every 4 (four) hours as needed for wheezing or shortness of breath. 18 each 0   alfuzosin (UROXATRAL) 10 MG 24 hr tablet Take 1 tablet (10 mg total) by mouth daily with breakfast. 30 tablet 0   cefpodoxime (VANTIN) 100 MG tablet Take 1 tablet (100 mg total) by mouth 2 (two) times daily. 56 tablet 0   cetirizine (ZYRTEC) 10 MG tablet Take 10 mg by mouth daily.     Chlorphen-Phenyleph-ASA (ALKA-SELTZER PLUS COLD PO) Take 2 tablets by mouth daily as needed (congestion).     clotrimazole (LOTRIMIN) 1 % cream Apply to affected area 2 times daily (Patient taking differently: Apply 1 Application topically 2 (two) times daily as needed (itching).) 15 g 0   COMBIGAN 0.2-0.5 % ophthalmic solution Apply  1 drop to eye 2 (two) times daily.     esomeprazole (NEXIUM) 20 MG capsule Take 40 mg by mouth daily.     finasteride (PROSCAR) 5 MG tablet Take 1 tablet (5 mg total) by mouth daily. 90 tablet 3   fluticasone-salmeterol (ADVAIR HFA) 230-21 MCG/ACT inhaler Inhale 2 puffs into the lungs 2 (two) times daily.     guaiFENesin (MUCINEX) 600 MG 12 hr tablet Take 600 mg by mouth 2 (two) times daily as needed for cough.     ibuprofen (ADVIL) 200 MG tablet Take 600 mg by mouth every 6 (six) hours as needed for moderate pain (pain score 4-6).     LORazepam (ATIVAN) 0.5 MG tablet TAKE 1 TABLET BY MOUTH ONCE DAILY  AT BEDTIME AS NEEDED 30 tablet 3   lovastatin (MEVACOR) 40 MG tablet Take 1 tablet (40 mg total) by mouth daily. 90 tablet 0   metroNIDAZOLE (METROGEL) 0.75 % gel Apply 1 Application topically daily as needed (rosacea).     oxymetazoline (AFRIN) 0.05 % nasal spray Place 1 spray into both nostrils 2 (two) times daily as needed for congestion.     pseudoephedrine-guaifenesin (MUCINEX D) 60-600 MG 12 hr tablet Take 1 tablet by mouth 2 (two) times daily as needed for congestion.     Spacer/Aero-Holding Rudean Curt Use as directed 1 each 0   triamcinolone cream (KENALOG) 0.1 % Apply 1 Application topically 2 (two) times daily as needed (itching).     No current facility-administered medications for this visit.    Allergies  Bactrim [sulfamethoxazole-trimethoprim], Beta adrenergic blockers, Lasix [furosemide], Doxycycline, Myrbetriq [mirabegron], Ciprofloxacin, Dexamethasone, Gabapentin, Methocarbamol, Neomycin, Penicillins, and Tetracyclines & related  Electrocardiogram:  04/07/18  SR PAC;s otherwise normal 07/21/2023 SR rate 69 normal   Assessment and Plan  CAD:  Distant PCI RCA 1999. Myovue 05/2022 fixed old IMI no ischemia low risk  Clinically stable Intolerant to beta blockers continue medical Rx   AAA: Now 6 cm plan per Dr Chestine Spore plan endo graft repair 07/29/23   HTN:  Well controlled.  Continue current medications and low sodium Dash type diet.    GERD:  Continue carafate and nexium f/u GI  Asthma/Emphysema :  On inhalers no active wheezing F/U [place]   PAC/SVT:  Stable unable to take beta blockers  Anxiety/Depression:   Has not started on SSRI   ENT:  ARB held hearing worse using Aides no further epistaxis Double vision w/u ongoing MRI negative f/u primary and Dr Dione Booze   HLD:  On mevacor 40 mg daily LDL 67 at goal   Hernia:  F/u Lovell Sheehan post surgery 2021 improved     F/U with me in 6 months  Charlton Haws

## 2023-07-12 NOTE — Patient Instructions (Signed)
 Blood in the Pee (Hematuria) in Adults: What to Know  Hematuria is blood in the pee. You may be able to see blood in the pee. In some cases, a health care provider may find blood with a test.  Blood in the pee can be caused by infections of the kidney, bladder, or the urethra. The urethra is the tube that drains pee from the bladder.  Other causes may include: Kidney stones. Infection of the prostate. Cancer. Too much calcium in the pee. Conditions that are passed from parent to child. Too much exercise. Infections can be treated with medicine. A kidney stone will usually leave your body when you pee. If infections or kidney stones didn't cause the blood in the urine, then more tests may be needed. It is very important to tell your provider about any blood in your pee, even if you have no pain or the blood stops with no treatment. Blood in the pee can be a sign of a very serious problem, such as cancer. Follow these instructions at home: Medicines Take your medicines only as told. If you were given antibiotics, take them as told. Do not stop taking them even if you start to feel better. Eating and drinking Drink more fluids as told. Aim to drink 3-4 quarts (2.8-3.8 L) a day. Avoid caffeine, tea, and carbonated drinks. These can bother the bladder. Avoid alcohol if a male because it may irritate the prostate. General instructions If you have been diagnosed with a kidney stone, strain your pee to catch the stone if told by your provider. Empty your bladder often. Avoid holding pee for a long time. If you're male, make sure that: You wipe from front to back after using the bathroom. You use each piece of toilet paper only once. You pee before and after sex. It's up to you to get the results of any tests. Ask when your results will be ready and how to get them. You may need to call or meet with your provider to get your results. Keep all follow-up visits. Your provider will need to know  about any changes or any new symptoms. Contact a health care provider if: Your symptoms don't get better after 3 days. Your symptoms get worse. You have back pain or belly pain. You have a fever or chills. You throw up or feel like you may throw up. You throw up every time you take medicine. Get help right away if: You pass blood clots in your pee. You pass out. These symptoms may be an emergency. Call 911 right away. Do not wait to see if the symptoms will go away. Do not drive yourself to the hospital. This information is not intended to replace advice given to you by your health care provider. Make sure you discuss any questions you have with your health care provider. Document Revised: 02/17/2023 Document Reviewed: 01/27/2023 Elsevier Patient Education  2024 [employer].

## 2023-07-13 ENCOUNTER — Other Ambulatory Visit: Payer: Self-pay

## 2023-07-13 DIAGNOSIS — D689 Coagulation defect, unspecified: Secondary | ICD-10-CM

## 2023-07-13 DIAGNOSIS — I714 Abdominal aortic aneurysm, without rupture, unspecified: Secondary | ICD-10-CM

## 2023-07-18 ENCOUNTER — Telehealth: Payer: Self-pay | Admitting: Internal Medicine

## 2023-07-18 MED ORDER — ALBUTEROL SULFATE (2.5 MG/3ML) 0.083% IN NEBU: 2.5000 mg | INHALATION_SOLUTION | Freq: Four times a day (QID) | RESPIRATORY_TRACT | 5 refills | Status: DC | PRN

## 2023-07-18 NOTE — Telephone Encounter (Signed)
 He wanted to add that he'd like someone to call him once filled because he will be recovering from surgery soon and does not want this to be missed. TY.

## 2023-07-18 NOTE — Telephone Encounter (Signed)
 Confirmed with patient he needs Alb Nebs. Sent to pharmacy. Patient aware. Nothing further needed at this time.

## 2023-07-18 NOTE — Telephone Encounter (Signed)
 PT recently tsf to [facility] office because it was closer. He was given a Breztri sample by Dr. [name] that did not work as well for him. Dr. [name] told him to go back on Albuterol and Advair. He has Medicare Part D and needs a Albuterol refill now but the Pharmacy said it has to be sent in Dr. Thurston Hole name, not Dr. Trena Platt name, in order for it to be refilled. Usually they give him 7-8 boxes of it.   Pharm is [facility] in [facility].

## 2023-07-19 ENCOUNTER — Telehealth: Payer: Self-pay

## 2023-07-19 ENCOUNTER — Other Ambulatory Visit (HOSPITAL_COMMUNITY): Payer: Self-pay

## 2023-07-19 NOTE — Telephone Encounter (Signed)
 Pharmacy Patient Advocate Encounter   Received notification from CoverMyMeds that prior authorization for Albuterol Sulfate (2.5 MG/3ML)0.083% nebulizer solution is required/requested.   Insurance verification completed.   The patient is insured through [employer].   Per test claim: PA required; PA submitted to above mentioned insurance via CoverMyMeds Key/confirmation #/EOC [id number] Status is pending

## 2023-07-20 NOTE — Telephone Encounter (Signed)
 Pharmacy Patient Advocate Encounter  Received notification from [employer] that Prior Authorization for Albuterol Sulfate (2.5 MG/3ML)0.083% nebulizer solution has been DENIED.  Full denial letter will be uploaded to the media tab. See denial reason below.  The information received does not support approval under your Medicare Part D benefit; however, coverage or payment may be available under your Part A or Part B benefit. The Social Security Act states that a drug prescribed to a Part D eligible individual cannot be considered a covered Part D drug if payment for the drug is or would be available under Medicare Part A or Part B. Medicare Part B covers the request drug when used in the home, or a home-type setting, with a nebulizer. Because the information we have shows that you will be using the requested drug in a nebulizer in your home, it cannot be covered under your Medicare Part D benefit. This request was denied under your Medicare Part D benefit; however, it may be covered under Medicare Part A or Part B. For more information, talk to your prescriber or call 1-800-MEDICARE. We hope this letter helps you understand why we denied your request.   PA #/Case ID/Reference #: [id number]

## 2023-07-21 ENCOUNTER — Telehealth: Payer: Self-pay

## 2023-07-21 ENCOUNTER — Telehealth: Payer: Self-pay | Admitting: Internal Medicine

## 2023-07-21 ENCOUNTER — Ambulatory Visit: Payer: Medicare Other | Attending: Cardiovascular Disease | Admitting: Cardiovascular Disease

## 2023-07-21 ENCOUNTER — Encounter (HOSPITAL_COMMUNITY): Payer: Self-pay

## 2023-07-21 ENCOUNTER — Encounter: Payer: Self-pay | Admitting: Cardiovascular Disease

## 2023-07-21 ENCOUNTER — Ambulatory Visit (HOSPITAL_COMMUNITY): Payer: Medicare Other

## 2023-07-21 VITALS — BP 130/88 | HR 86 | Ht 73.0 in | Wt 210.2 lb

## 2023-07-21 DIAGNOSIS — E782 Mixed hyperlipidemia: Secondary | ICD-10-CM

## 2023-07-21 DIAGNOSIS — I10 Essential (primary) hypertension: Secondary | ICD-10-CM

## 2023-07-21 DIAGNOSIS — I714 Abdominal aortic aneurysm, without rupture, unspecified: Secondary | ICD-10-CM | POA: Diagnosis not present

## 2023-07-21 DIAGNOSIS — I2510 Atherosclerotic heart disease of native coronary artery without angina pectoris: Secondary | ICD-10-CM | POA: Diagnosis not present

## 2023-07-21 NOTE — Telephone Encounter (Signed)
 Unless having new or worsening resp symptoms since last ov he's cleared for surgery from pulm perspective

## 2023-07-21 NOTE — Telephone Encounter (Signed)
 Called and lvm with Surgery Clinic Team advised them that this patient is clear for sx for next Friday if they needed any additional information they can call the office to let us know. Give verbal ok for sx  Per Dr [name].

## 2023-07-21 NOTE — Patient Instructions (Signed)
Medication Instructions:  Your physician recommends that you continue on your current medications as directed. Please refer to the Current Medication list given to you today.  *If you need a refill on your cardiac medications before your next appointment, please call your pharmacy*   Lab Work: NONE   If you have labs (blood work) drawn today and your tests are completely normal, you will receive your results only by: MyChart Message (if you have MyChart) OR A paper copy in the mail If you have any lab test that is abnormal or we need to change your treatment, we will call you to review the results.   Testing/Procedures: NONE    Follow-Up: At [facility], you and your health needs are our priority.  As part of our continuing mission to provide you with exceptional heart care, we have created designated Provider Care Teams.  These Care Teams include your primary Cardiologist (physician) and Advanced Practice Providers (APPs -  Physician Assistants and Nurse Practitioners) who all work together to provide you with the care you need, when you need it.  We recommend signing up for the patient portal called "MyChart".  Sign up information is provided on this After Visit Summary.  MyChart is used to connect with patients for Virtual Visits (Telemedicine).  Patients are able to view lab/test results, encounter notes, upcoming appointments, etc.  Non-urgent messages can be sent to your provider as well.   To learn more about what you can do with MyChart, go to [email or web address].    Your next appointment:   6 month(s)  Provider:   You may see Peter Nishan, MD or one of the following Advanced Practice Providers on your designated Care Team:   Brittany Strader, PA-C  Michele Lenze, PA-C     Other Instructions Thank you for choosing [facility]!    

## 2023-07-21 NOTE — Telephone Encounter (Signed)
 Did you need to see the patient for an appt

## 2023-07-21 NOTE — Telephone Encounter (Signed)
 Return call to patient. Patient state's he was informed through email from pre admission testing, to reach out to all of his physicians and make them aware that he will be having Insertion Endovascular stent graft aorta abdominal surgery next week. Patient is made aware provider will be informed. Patient verbalized understanding

## 2023-07-21 NOTE — Pre-Procedure Instructions (Signed)
 Surgical Instructions   Your procedure is scheduled on July 29, 2023. Report to [facility] Main Entrance "A" at 5:30 A.M., then check in with the Admitting office. Any questions or running late day of surgery: call [phone]  Questions prior to your surgery date: call [phone], Monday-Friday, 8am-4pm. If you experience any cold or flu symptoms such as cough, fever, chills, shortness of breath, etc. between now and your scheduled surgery, please notify us at the above number.     Remember:  Do not eat or drink after midnight the night before your surgery    Take these medicines the morning of surgery with A SIP OF WATER: alfuzosin (UROXATRAL)  cefpodoxime (VANTIN)  cetirizine (ZYRTEC)  COMBIGAN ophthalmic solution  esomeprazole (NEXIUM)  finasteride (PROSCAR)  fluticasone-salmeterol (ADVAIR HFA) inhaler  lovastatin (MEVACOR)    May take these medicines IF NEEDED: albuterol (PROVENTIL) nebulizer solution  albuterol (VENTOLIN HFA) inhaler - please bring inhaler with you morning of surgery oxymetazoline (AFRIN) nasal spray    One week prior to surgery, STOP taking any Aspirin (unless otherwise instructed by your surgeon) Aleve, Naproxen, Ibuprofen, Motrin, Advil, Goody's, BC's, all herbal medications, fish oil, and non-prescription vitamins. This includes your medication: Chlorphen-Phenyleph-ASA (ALKA-SELTZER PLUS COLD PO)                      Do NOT Smoke (Tobacco/Vaping) for 24 hours prior to your procedure.  If you use a CPAP at night, you may bring your mask/headgear for your overnight stay.   You will be asked to remove any contacts, glasses, piercing's, hearing aid's, dentures/partials prior to surgery. Please bring cases for these items if needed.    Patients discharged the day of surgery will not be allowed to drive home, and someone needs to stay with them for 24 hours.  SURGICAL WAITING ROOM VISITATION Patients may have no more than 2 support people in the  waiting area - these visitors may rotate.   Pre-op nurse will coordinate an appropriate time for 1 ADULT support person, who may not rotate, to accompany patient in pre-op.  Children under the age of 62 must have an adult with them who is not the patient and must remain in the main waiting area with an adult.  If the patient needs to stay at the hospital during part of their recovery, the visitor guidelines for inpatient rooms apply.  Please refer to the [facility] website for the visitor guidelines for any additional information.   If you received a COVID test during your pre-op visit  it is requested that you wear a mask when out in public, stay away from anyone that may not be feeling well and notify your surgeon if you develop symptoms. If you have been in contact with anyone that has tested positive in the last 10 days please notify you surgeon.      Pre-operative CHG Bathing Instructions   You can play a key role in reducing the risk of infection after surgery. Your skin needs to be as free of germs as possible. You can reduce the number of germs on your skin by washing with CHG (chlorhexidine gluconate) soap before surgery. CHG is an antiseptic soap that kills germs and continues to kill germs even after washing.   DO NOT use if you have an allergy to chlorhexidine/CHG or antibacterial soaps. If your skin becomes reddened or irritated, stop using the CHG and notify one of our RNs at [phone].  TAKE A SHOWER THE NIGHT BEFORE SURGERY AND THE DAY OF SURGERY    Please keep in mind the following:  DO NOT shave, including legs and underarms, 48 hours prior to surgery.   You may shave your face before/day of surgery.  Place clean sheets on your bed the night before surgery Use a clean washcloth (not used since being washed) for each shower. DO NOT sleep with pet's night before surgery.  CHG Shower Instructions:  Wash your face and private area with normal soap. If you  choose to wash your hair, wash first with your normal shampoo.  After you use shampoo/soap, rinse your hair and body thoroughly to remove shampoo/soap residue.  Turn the water OFF and apply half the bottle of CHG soap to a CLEAN washcloth.  Apply CHG soap ONLY FROM YOUR NECK DOWN TO YOUR TOES (washing for 3-5 minutes)  DO NOT use CHG soap on face, private areas, open wounds, or sores.  Pay special attention to the area where your surgery is being performed.  If you are having back surgery, having someone wash your back for you may be helpful. Wait 2 minutes after CHG soap is applied, then you may rinse off the CHG soap.  Pat dry with a clean towel  Put on clean pajamas    Additional instructions for the day of surgery: DO NOT APPLY any lotions, deodorants, cologne, or perfumes.   Do not wear jewelry or makeup Do not wear nail polish, gel polish, artificial nails, or any other type of covering on natural nails (fingers and toes) Do not bring valuables to the hospital. [facility] is not responsible for valuables/personal belongings. Put on clean/comfortable clothes.  Please brush your teeth.  Ask your nurse before applying any prescription medications to the skin.

## 2023-07-21 NOTE — Telephone Encounter (Signed)
 Per Rodney Booze at [facility], patient has been cleared by Dr. Shona Simpson for surgery (AAA).

## 2023-07-21 NOTE — Telephone Encounter (Signed)
 Patient states he is scheduled for AAA stenting next week by Dr.Christopher Chestine Spore. Patient and Dr. Dr. Ophelia Charter office  wants to make sure he is cleared to have this surgery---Paitent call back [phone]

## 2023-07-22 ENCOUNTER — Telehealth: Payer: Self-pay | Admitting: Cardiovascular Disease

## 2023-07-22 ENCOUNTER — Encounter (HOSPITAL_COMMUNITY)
Admission: RE | Admit: 2023-07-22 | Discharge: 2023-07-22 | Disposition: A | Discharge status: Home / Self Care | Source: Ambulatory Visit | Attending: Vascular Surgery | Admitting: Vascular Surgery

## 2023-07-22 ENCOUNTER — Encounter (HOSPITAL_COMMUNITY): Payer: Self-pay

## 2023-07-22 ENCOUNTER — Other Ambulatory Visit: Payer: Self-pay

## 2023-07-22 VITALS — BP 143/95 | HR 87 | Temp 98.0°F | Resp 18 | Ht 73.0 in | Wt 207.5 lb

## 2023-07-22 DIAGNOSIS — D689 Coagulation defect, unspecified: Secondary | ICD-10-CM | POA: Diagnosis not present

## 2023-07-22 DIAGNOSIS — E7800 Pure hypercholesterolemia, unspecified: Secondary | ICD-10-CM | POA: Diagnosis not present

## 2023-07-22 DIAGNOSIS — I10 Essential (primary) hypertension: Secondary | ICD-10-CM | POA: Insufficient documentation

## 2023-07-22 DIAGNOSIS — J4489 Other specified chronic obstructive pulmonary disease: Secondary | ICD-10-CM | POA: Insufficient documentation

## 2023-07-22 DIAGNOSIS — K219 Gastro-esophageal reflux disease without esophagitis: Secondary | ICD-10-CM | POA: Insufficient documentation

## 2023-07-22 DIAGNOSIS — Z87891 Personal history of nicotine dependence: Secondary | ICD-10-CM | POA: Diagnosis not present

## 2023-07-22 DIAGNOSIS — I2510 Atherosclerotic heart disease of native coronary artery without angina pectoris: Secondary | ICD-10-CM | POA: Diagnosis not present

## 2023-07-22 DIAGNOSIS — I252 Old myocardial infarction: Secondary | ICD-10-CM | POA: Diagnosis not present

## 2023-07-22 DIAGNOSIS — I7143 Infrarenal abdominal aortic aneurysm, without rupture: Secondary | ICD-10-CM | POA: Diagnosis not present

## 2023-07-22 DIAGNOSIS — Z01818 Encounter for other preprocedural examination: Secondary | ICD-10-CM

## 2023-07-22 DIAGNOSIS — Z01812 Encounter for preprocedural laboratory examination: Secondary | ICD-10-CM | POA: Diagnosis not present

## 2023-07-22 DIAGNOSIS — I714 Abdominal aortic aneurysm, without rupture, unspecified: Secondary | ICD-10-CM

## 2023-07-22 DIAGNOSIS — R7301 Impaired fasting glucose: Secondary | ICD-10-CM | POA: Insufficient documentation

## 2023-07-22 DIAGNOSIS — H409 Unspecified glaucoma: Secondary | ICD-10-CM | POA: Insufficient documentation

## 2023-07-22 HISTORY — DX: Peripheral vascular disease, unspecified: I73.9

## 2023-07-22 LAB — COMPREHENSIVE METABOLIC PANEL
ALT: 17 U/L (ref 0–44)
AST: 21 U/L (ref 15–41)
Albumin: 3.6 g/dL (ref 3.5–5.0)
Alkaline Phosphatase: 49 U/L (ref 38–126)
Anion gap: 9 (ref 5–15)
BUN: 12 mg/dL (ref 8–23)
CO2: 30 mmol/L (ref 22–32)
Calcium: 9.4 mg/dL (ref 8.9–10.3)
Chloride: 101 mmol/L (ref 98–111)
Creatinine, Ser: 0.93 mg/dL (ref 0.61–1.24)
GFR, Estimated: >60 mL/min (ref >60)
Glucose, Bld: 100 mg/dL — ABNORMAL HIGH (ref 70–99)
Potassium: 4.8 mmol/L (ref 3.5–5.1)
Sodium: 140 mmol/L (ref 135–145)
Total Bilirubin: 0.9 mg/dL (ref 0.0–1.2)
Total Protein: 6.9 g/dL (ref 6.5–8.1)

## 2023-07-22 LAB — TYPE AND SCREEN
ABO/RH(D): O POS
Antibody Screen: NEGATIVE

## 2023-07-22 LAB — URINALYSIS, ROUTINE W REFLEX MICROSCOPIC
Bilirubin Urine: NEGATIVE
Glucose, UA: NEGATIVE mg/dL
Hgb urine dipstick: NEGATIVE
Ketones, ur: NEGATIVE mg/dL
Leukocytes,Ua: NEGATIVE
Nitrite: NEGATIVE
Protein, ur: NEGATIVE mg/dL
Specific Gravity, Urine: 1.013 (ref 1.005–1.030)
pH: 7 (ref 5.0–8.0)

## 2023-07-22 LAB — CBC
HCT: 43.7 % (ref 39.0–52.0)
Hemoglobin: 13.8 g/dL (ref 13.0–17.0)
MCH: 28.1 pg (ref 26.0–34.0)
MCHC: 31.6 g/dL (ref 30.0–36.0)
MCV: 89 fL (ref 80.0–100.0)
Platelets: 202 10*3/uL (ref 150–400)
RBC: 4.91 MIL/uL (ref 4.22–5.81)
RDW: 13.7 % (ref 11.5–15.5)
WBC: 8.5 10*3/uL (ref 4.0–10.5)
nRBC: 0 % (ref 0.0–0.2)

## 2023-07-22 LAB — SURGICAL PCR SCREEN
MRSA, PCR: NEGATIVE
Staphylococcus aureus: NEGATIVE

## 2023-07-22 LAB — APTT: aPTT: 35 s (ref 24–36)

## 2023-07-22 LAB — PROTIME-INR
INR: 1.1 (ref 0.8–1.2)
Prothrombin Time: 14.2 s (ref 11.4–15.2)

## 2023-07-22 NOTE — Progress Notes (Addendum)
 PCP - Dr. Everlene Other Cardiologist - Dr. Charlton Haws; last office visit 07/21/2023 Pulmonologist: Dr. Sandrea Hughs, last office visit 05/05/2023  PPM/ICD - denies Device Orders - na Rep Notified - na  Chest x-ray - 03/19/2023 EKG - 03/21/2023 Stress Test - 05/24/2022 ECHO - 09/18/2010 Cardiac Cath - 1991  Sleep Study - denies CPAP - na  Non-diabetic  Blood Thinner Instructions: denies Aspirin Instructions:denies  ERAS Protcol -NPO  Anesthesia review: Yes. HTN, CAD, MI, COPD.  Was on Vantin for lower urinary tract symptoms, started 06/27/2023, completed 07/17/2023.  States most of the symptoms are resolved  Patient denies shortness of breath, fever, cough and chest pain at PAT appointment   All instructions explained to the patient, with a verbal understanding of the material. Patient agrees to go over the instructions while at home for a better understanding. Patient also instructed to self quarantine after being tested for COVID-19. The opportunity to ask questions was provided.

## 2023-07-22 NOTE — Telephone Encounter (Signed)
 Spoke with Lajoyce Corners and informed that no orders were placed during his visit on yesterday

## 2023-07-22 NOTE — Telephone Encounter (Signed)
 [name] [name] with [facility] states they received a fax but it didn't have any orders attached so she would like to clarify what is needed. Please advise.

## 2023-07-25 NOTE — Progress Notes (Signed)
 Anesthesia Chart Review:  Case: [id number] Date/Time: 07/29/23 0715   Procedure: INSERTION, ENDOVASCULAR STENT GRAFT, AORTA, ABDOMINAL   Anesthesia type: General   Pre-op diagnosis: INFRARENAL ABDOMINAL AORTIC ANEURYSM WITHOUT RUPTURE   Location: [facility] OR ROOM 16 / [facility] OR   Surgeons: Cephus Shelling, MD       DISCUSSION: Patient is an 83 year old male scheduled for the above procedure.  History includes former smoker (quit 03/28/03), HTN, hypercholesterolemia, COPD, asthma, AAA (6 cm 06/2023 CT), CAD (inferior MI, s/p PTCA 1999), dysrhythmia (occasional tachycardia), impaired fasting glucose, BPH, GERD, anemia, skin cancer (SCC, melanoma in situ, BCC), glaucoma, idiopathic pancreatitis (2022), spinal surgery (L5-S1 PLIF 11/05/14), right hemi-colectomy (for large tubulovillous adenoma 06/11/13), HOH (has hearing aids).   Last visit with cardiologist Dr. Eden Emms was on 07/21/23. Patient with remote inferior MI with PCI in 1999. Nuclear stress test on 05/24/22 was consistent with prior inferior infarct with mild peri-infarct ischemia, EF 45%. No additional testing ordered. CAD was felt clinically stable. Patient intolerant to b-blockers. He noted plans for EVAR for 6.0 cm AAA.    Last pulmonology visit with Dr. [name] was on 05/05/23 for follow-up Gold 3 COPD and asthma. He did do pulmonary rehab in 2022. Note suggests asthma may be the more predominant issue at that time. He recommended changing Advair to [facility], but patient desired to go back to Advair after he developed thrush on [place]. He was given a Zpack for purulent sputum. Current pulmonary related medications include albuterol HFA or nebulizer as needed, cetrizine 10 mg daily, Nexium 40 mg daily, Advair HFA 230-21 mg 2 puffs BID, Mucinex BID as needed, Afrin nasal spray as needed.  On 07/21/22, Dr. [name] wrote, "Unless having new or worsening resp symptoms since last ov he's cleared for surgery from pulm perspective".  Anesthesia team to evaluate on the  day of surgery.   VS: BP (!) 143/95   Pulse 87   Temp 36.7 C   Resp 18   Ht 6\' 1"  (1.854 m)   Wt 94.1 kg   SpO2 97%   BMI 27.38 kg/m    PROVIDERS: Tommie Sams, DO is PCP  Charlton Haws, MD is cardiologist Sherald Hess, MD is vascular surgeon Sandrea Hughs, MD is pulmonologist Wilkie Aye, MD is urologist   LABS: Labs reviewed: Acceptable for surgery. (all labs ordered are listed, but only abnormal results are displayed)  Labs Reviewed  COMPREHENSIVE METABOLIC PANEL - Abnormal; Notable for the following components:      Result Value   Glucose, Bld 100 (*)    All other components within normal limits  SURGICAL PCR SCREEN  CBC  PROTIME-INR  APTT  URINALYSIS, ROUTINE W REFLEX MICROSCOPIC  TYPE AND SCREEN    PFTs 11/05/19: "FEV1 1.72 (47 % ) ratio 0.61 p 8 % improvement from saba p saba prior to study with DLCO 32.71 (114 %) and FV curve classically concave"   IMAGES: CT Abd/pelvis 07/07/23: IMPRESSION: 1. No acute inflammatory process identified within the abdomen or pelvis. There are 2, 1-1.5 mm nonobstructing calculi in the right kidney. No other nephroureterolithiasis on either side. No obstructive uropathy. 2. Saccular aneurysmal dilation of infrarenal aorta measuring up to 4.7 x 6.0 cm. There is mild interval increase in the size when compared to the prior exam when it measured up to 4.6 x 5.8 cm. Vascular surgery consultation is recommended, if not previously performed. 3. Small grouping of tree-in-bud configuration nodules noted in the left lung lower lobe, nonspecific  but likely sequela of infection or inflammation. 4. Multiple other nonacute observations, as described above. - Aortic Atherosclerosis (ICD10-I70.0).   MRI C-spine 05/02/23: IMPRESSION: 1. Multilevel degenerative changes of the cervical spine, most pronounced at C4-C5 where there is moderate spinal canal narrowing and moderate to severe bilateral neuroforaminal narrowing  (left greater than right). 2. Additional degenerative changes as above, including severe right neural foraminal narrowing at C6-C7.   EKG: 03/19/23: Sinus rhythm Paired ventricular premature complexes Abnormal R-wave progression, early transition Borderline repolarization abnormality Minimal ST elevation, inferior leads Confirmed by Zadie Rhine ([id number]) on 03/20/2023 9:05:16 AM   CV: Nuclear stress test 05/24/22:   No ST deviation was noted during pharmacological stress.   There is a fixed medium defect with mild reduction in uptake in the inferior wall with abnormal regional wall function consistent with prior infarction with mild peri-infarct ischemia. The study is low risk.   Left ventricular function is mildly reduced at 45%. Consider Echocardiogram for accurate estimation of left ventricular systolic function.    Echo 09/18/10: Study conclusions: 1.  Left ventricle: The cavity size was normal.  Systolic function was normal.  Estimated ejection fraction was in the range of 55 to 60%.  Wall motion was normal; there was no regional wall motion abnormalities. 2.  Left atrium: The left atrium was mildly dilated.  Past Medical History:  Diagnosis Date   AAA (abdominal aortic aneurysm) (HCC)    needs yearly ultrasound   Allergy    Anemia    Arthritis    Asthma    BCC (basal cell carcinoma) 08/18/1989   left shoulder blad, upper right arm, left arm beyond elbow, c&d   BCC (basal cell carcinoma) 01/31/1992   Posterior neck, curetx3, 47fu   BCC (basal cell carcinoma) 11/22/2001   mid forehead, cx3, excision, right forearm, cx3, 3fu   BCC (basal cell carcinoma) 10/09/2003   mid forehead, MOHs   BCC (basal cell carcinoma) 08/15/2008   upper left back, biopsy   BPH (benign prostatic hyperplasia)    CAD (coronary artery disease)    Cancer (HCC)    skin cancer   COPD (chronic obstructive pulmonary disease) (HCC)    Dysrhythmia    pt. states it can be fast at times   GERD  (gastroesophageal reflux disease)    Glaucoma    History of acute pancreatitis 12/21/2022   HOH (hard of hearing)    Hypercholesterolemia    Hypertension    Impaired fasting glucose    Low back pain    Melanoma in situ (HCC) 10/09/2003   left chin, MOHs   MI (myocardial infarction) (HCC) 1999   Peripheral vascular disease (HCC)    AAA   SCC (squamous cell carcinoma) 07/03/2014   in situ, behind left ear, cx3, cautery, 71fu   SCC (squamous cell carcinoma) 07/03/2014   well diff, left forearm, biopsy, cx1, cautery   SCC (squamous cell carcinoma) 07/20/2017   in situ, left upper arm, cx3, 15fu   SCC (squamous cell carcinoma) 01/10/2019   in situ, left post shoulder, cx3, 30fu   SCC (squamous cell carcinoma) 11/22/2001   left forearm distal, left forearm, cx3, 68fu   SCC (squamous cell carcinoma) 10/09/2003   [place], left ear post, clear per st, right cheek clear   SCC (squamous cell carcinoma) 03/30/2004   in situ, left upper arm, cx3, 57fu   SCC (squamous cell carcinoma) 03/08/2005   in situ, right cheek, mid upper forehead, cx3, 66fu   SCC (squamous cell  carcinoma) 06/08/2006   in situ, left shoulder, cx3, 34fu   SCC (squamous cell carcinoma) 05/05/2010   right inner wrist, biopsy   SCC (squamous cell carcinoma) 09/13/2013   in situ, right crown scalp, front scalp, biopsy   Thrush     Past Surgical History:  Procedure Laterality Date   BACK SURGERY     x 3   CARDIAC CATHETERIZATION     angioplasty   CATARACT EXTRACTION W/PHACO  03/20/2012   Procedure: CATARACT EXTRACTION PHACO AND INTRAOCULAR LENS PLACEMENT (IOC);  Surgeon: Susa Simmonds, MD;  Location: [facility];  Service: Ophthalmology;  Laterality: Right;  CDE:  8.45   CATARACT EXTRACTION W/PHACO Left 04/02/2013   Procedure: CATARACT EXTRACTION PHACO AND INTRAOCULAR LENS PLACEMENT (IOC);  Surgeon: Susa Simmonds, MD;  Location: [facility];  Service: Ophthalmology;  Laterality: Left;  CDE:  6.50   CHOLECYSTECTOMY  2000    COLONOSCOPY  2009   repeat 5 years   ESOPHAGOGASTRODUODENOSCOPY     HERNIA REPAIR Left    inguinal   INGUINAL HERNIA REPAIR Right 03/28/2020   Procedure: Right Inguinal Herniorrhaphy with Mesh;  Surgeon: Franky Macho, MD;  Location: [facility];  Service: General;  Laterality: Right;   LAPAROSCOPIC PARTIAL COLECTOMY N/A 06/11/2013   Procedure: LAPAROSCOPIC HAND ASSISTED PARTIAL COLECTOMY;  Surgeon: Dalia Heading, MD;  Location: [facility];  Service: General;  Laterality: N/A;   NASAL ENDOSCOPY WITH EPISTAXIS CONTROL Bilateral 02/11/2020   Procedure: NASAL ENDOSCOPY WITH EPISTAXIS CONTROL;  Surgeon: Newman Pies, MD;  Location: [facility];  Service: ENT;  Laterality: Bilateral;   right eye detached retina Bilateral    SPINAL FUSION  2016   YAG LASER APPLICATION Left 05/06/2014   Procedure: YAG LASER APPLICATION;  Surgeon: Susa Simmonds, MD;  Location: [facility];  Service: Ophthalmology;  Laterality: Left;    MEDICATIONS:  albuterol (PROVENTIL) (2.5 MG/3ML) 0.083% nebulizer solution   albuterol (VENTOLIN HFA) 108 (90 Base) MCG/ACT inhaler   alfuzosin (UROXATRAL) 10 MG 24 hr tablet   cefpodoxime (VANTIN) 100 MG tablet   cetirizine (ZYRTEC) 10 MG tablet   Chlorphen-Phenyleph-ASA (ALKA-SELTZER PLUS COLD PO)   clotrimazole (LOTRIMIN) 1 % cream   COMBIGAN 0.2-0.5 % ophthalmic solution   esomeprazole (NEXIUM) 20 MG capsule   finasteride (PROSCAR) 5 MG tablet   fluticasone-salmeterol (ADVAIR HFA) 230-21 MCG/ACT inhaler   guaiFENesin (MUCINEX) 600 MG 12 hr tablet   ibuprofen (ADVIL) 200 MG tablet   LORazepam (ATIVAN) 0.5 MG tablet   lovastatin (MEVACOR) 40 MG tablet   metroNIDAZOLE (METROGEL) 0.75 % gel   oxymetazoline (AFRIN) 0.05 % nasal spray   pseudoephedrine-guaifenesin (MUCINEX D) 60-600 MG 12 hr tablet   Spacer/Aero-Holding Chambers DEVI   triamcinolone cream (KENALOG) 0.1 %   No current facility-administered medications for this encounter.   He is not currently on  cefpodoxime.    Shonna Chock, PA-C Surgical Short Stay/Anesthesiology [facility] Phone [phone] [facility] Phone [phone] 07/26/2023 3:34 PM

## 2023-07-26 ENCOUNTER — Other Ambulatory Visit: Payer: Self-pay

## 2023-07-26 ENCOUNTER — Telehealth: Payer: Self-pay | Admitting: *Deleted

## 2023-07-26 MED ORDER — ALBUTEROL SULFATE (2.5 MG/3ML) 0.083% IN NEBU: 2.5000 mg | INHALATION_SOLUTION | Freq: Four times a day (QID) | RESPIRATORY_TRACT | 5 refills | Status: AC | PRN

## 2023-07-26 NOTE — Anesthesia Preprocedure Evaluation (Signed)
 Anesthesia Evaluation  Patient identified by MRN, date of birth, ID band Patient awake    Reviewed: Allergy & Precautions, NPO status , Patient's Chart, lab work & pertinent test results  Airway Mallampati: III  TM Distance: >3 FB Neck ROM: Full    Dental  (+) Missing   Pulmonary asthma , COPD,  COPD inhaler, former smoker   Pulmonary exam normal breath sounds clear to auscultation       Cardiovascular hypertension, + CAD, + Past MI and + Peripheral Vascular Disease  Normal cardiovascular exam+ dysrhythmias  Rhythm:Regular Rate:Normal  Myocardial Perfusion   No ST deviation was noted during pharmacological stress.   There is a fixed medium defect with mild reduction in uptake in the inferior wall with abnormal regional wall function consistent with prior infarction with mild peri-infarct ischemia. The study is low risk.   Left ventricular function is mildly reduced at 45%. Consider Echocardiogram for accurate estimation of left ventricular systolic function.    Neuro/Psych negative neurological ROS  negative psych ROS   GI/Hepatic Neg liver ROS,GERD  Medicated and Controlled,,  Endo/Other  negative endocrine ROS    Renal/GU negative Renal ROS     Musculoskeletal  (+) Arthritis ,    Abdominal   Peds  Hematology negative hematology ROS (+)   Anesthesia Other Findings INFRARENAL ABDOMINAL AORTIC ANEURYSM WITHOUT RUPTURE  Reproductive/Obstetrics                              Anesthesia Physical Anesthesia Plan  ASA: 4  Anesthesia Plan: General   Post-op Pain Management:    Induction: Intravenous  PONV Risk Score and Plan: 2 and Ondansetron, Dexamethasone and Treatment may vary due to age or medical condition  Airway Management Planned: Oral ETT  Additional Equipment: Arterial line  Intra-op Plan:   Post-operative Plan: Extubation in OR  Informed Consent: I have reviewed the  patients History and Physical, chart, labs and discussed the procedure including the risks, benefits and alternatives for the proposed anesthesia with the patient or authorized representative who has indicated his/her understanding and acceptance.     Dental advisory given  Plan Discussed with: CRNA  Anesthesia Plan Comments: (PAT note written 07/26/2023 by Shonna Chock, PA-C. EVAR for 6.0 cm AAA.   )        Anesthesia Quick Evaluation

## 2023-07-26 NOTE — Telephone Encounter (Signed)
 Patient called and states he called our office on 07/18/2023 for an albuterol refill due to [facility] not working. However, there may have been an error in sending it through to the pharmacy.  Patient would like a 30 day supply of 2.5mg  sent to [facility] in [place] and states that his medicare part D should cover for it.   Patient would like a call back as soon as the prescription is sent.  [phone]

## 2023-07-27 ENCOUNTER — Telehealth: Payer: Self-pay

## 2023-07-27 NOTE — Telephone Encounter (Signed)
 Triage: -pt LM on triage line stating he has surgery schedule with Dr. Chestine Spore on Friday and his allergies are bothering him.  Wants to know if taking DayQuil is OK to take.  He states he had COPD and allergies especially when the weather is changing like it is right now. -returned call to patient and advised DayQuil is ok if it normally works for his allergies but if it does not, or he is worsening then he has been advised to see his PCP or urgent care.  He should also call us tomorrow if not feeling better considering his upcoming surgery.

## 2023-07-29 ENCOUNTER — Inpatient Hospital Stay (HOSPITAL_COMMUNITY)

## 2023-07-29 ENCOUNTER — Other Ambulatory Visit: Payer: Self-pay

## 2023-07-29 ENCOUNTER — Inpatient Hospital Stay (HOSPITAL_COMMUNITY)
Admission: RE | Admit: 2023-07-29 | Discharge: 2023-07-30 | DRG: 269 | Disposition: A | Discharge status: Home / Self Care | Payer: Medicare Other | Attending: Vascular Surgery | Admitting: Vascular Surgery

## 2023-07-29 ENCOUNTER — Encounter (HOSPITAL_COMMUNITY)
Admission: RE | Disposition: A | Discharge status: Home / Self Care | Payer: Self-pay | Source: Home / Self Care | Attending: Vascular Surgery

## 2023-07-29 ENCOUNTER — Encounter (HOSPITAL_COMMUNITY): Payer: Self-pay | Admitting: Vascular Surgery

## 2023-07-29 ENCOUNTER — Inpatient Hospital Stay (HOSPITAL_COMMUNITY): Payer: Self-pay | Admitting: Vascular Surgery

## 2023-07-29 ENCOUNTER — Ambulatory Visit: Payer: Medicare Other | Admitting: Internal Medicine

## 2023-07-29 DIAGNOSIS — H409 Unspecified glaucoma: Secondary | ICD-10-CM | POA: Diagnosis present

## 2023-07-29 DIAGNOSIS — Z8249 Family history of ischemic heart disease and other diseases of the circulatory system: Secondary | ICD-10-CM | POA: Diagnosis not present

## 2023-07-29 DIAGNOSIS — Z85828 Personal history of other malignant neoplasm of skin: Secondary | ICD-10-CM

## 2023-07-29 DIAGNOSIS — K219 Gastro-esophageal reflux disease without esophagitis: Secondary | ICD-10-CM | POA: Diagnosis present

## 2023-07-29 DIAGNOSIS — I739 Peripheral vascular disease, unspecified: Secondary | ICD-10-CM | POA: Diagnosis present

## 2023-07-29 DIAGNOSIS — Z9861 Coronary angioplasty status: Secondary | ICD-10-CM

## 2023-07-29 DIAGNOSIS — I513 Intracardiac thrombosis, not elsewhere classified: Secondary | ICD-10-CM | POA: Diagnosis present

## 2023-07-29 DIAGNOSIS — Z86006 Personal history of melanoma in-situ: Secondary | ICD-10-CM | POA: Diagnosis not present

## 2023-07-29 DIAGNOSIS — Z981 Arthrodesis status: Secondary | ICD-10-CM

## 2023-07-29 DIAGNOSIS — I252 Old myocardial infarction: Secondary | ICD-10-CM | POA: Diagnosis not present

## 2023-07-29 DIAGNOSIS — Z87891 Personal history of nicotine dependence: Secondary | ICD-10-CM

## 2023-07-29 DIAGNOSIS — Z808 Family history of malignant neoplasm of other organs or systems: Secondary | ICD-10-CM

## 2023-07-29 DIAGNOSIS — J4489 Other specified chronic obstructive pulmonary disease: Secondary | ICD-10-CM | POA: Diagnosis present

## 2023-07-29 DIAGNOSIS — H9190 Unspecified hearing loss, unspecified ear: Secondary | ICD-10-CM | POA: Diagnosis present

## 2023-07-29 DIAGNOSIS — Z79899 Other long term (current) drug therapy: Secondary | ICD-10-CM

## 2023-07-29 DIAGNOSIS — Z888 Allergy status to other drugs, medicaments and biological substances status: Secondary | ICD-10-CM

## 2023-07-29 DIAGNOSIS — Z7951 Long term (current) use of inhaled steroids: Secondary | ICD-10-CM | POA: Diagnosis not present

## 2023-07-29 DIAGNOSIS — I10 Essential (primary) hypertension: Secondary | ICD-10-CM | POA: Diagnosis not present

## 2023-07-29 DIAGNOSIS — I714 Abdominal aortic aneurysm, without rupture, unspecified: Principal | ICD-10-CM | POA: Diagnosis present

## 2023-07-29 DIAGNOSIS — M1990 Unspecified osteoarthritis, unspecified site: Secondary | ICD-10-CM | POA: Diagnosis present

## 2023-07-29 DIAGNOSIS — Z882 Allergy status to sulfonamides status: Secondary | ICD-10-CM

## 2023-07-29 DIAGNOSIS — I7141 Pararenal abdominal aortic aneurysm, without rupture: Principal | ICD-10-CM

## 2023-07-29 DIAGNOSIS — N400 Enlarged prostate without lower urinary tract symptoms: Secondary | ICD-10-CM | POA: Diagnosis present

## 2023-07-29 DIAGNOSIS — Z825 Family history of asthma and other chronic lower respiratory diseases: Secondary | ICD-10-CM | POA: Diagnosis not present

## 2023-07-29 DIAGNOSIS — I7143 Infrarenal abdominal aortic aneurysm, without rupture: Secondary | ICD-10-CM

## 2023-07-29 DIAGNOSIS — Z9049 Acquired absence of other specified parts of digestive tract: Secondary | ICD-10-CM | POA: Diagnosis not present

## 2023-07-29 DIAGNOSIS — D62 Acute posthemorrhagic anemia: Secondary | ICD-10-CM | POA: Diagnosis not present

## 2023-07-29 DIAGNOSIS — E7800 Pure hypercholesterolemia, unspecified: Secondary | ICD-10-CM | POA: Diagnosis present

## 2023-07-29 DIAGNOSIS — Z881 Allergy status to other antibiotic agents status: Secondary | ICD-10-CM

## 2023-07-29 DIAGNOSIS — I2510 Atherosclerotic heart disease of native coronary artery without angina pectoris: Secondary | ICD-10-CM | POA: Diagnosis not present

## 2023-07-29 DIAGNOSIS — J449 Chronic obstructive pulmonary disease, unspecified: Secondary | ICD-10-CM | POA: Diagnosis not present

## 2023-07-29 DIAGNOSIS — Z880 Allergy status to penicillin: Secondary | ICD-10-CM

## 2023-07-29 DIAGNOSIS — R9389 Abnormal findings on diagnostic imaging of other specified body structures: Secondary | ICD-10-CM | POA: Diagnosis not present

## 2023-07-29 DIAGNOSIS — Z7982 Long term (current) use of aspirin: Secondary | ICD-10-CM

## 2023-07-29 HISTORY — PX: REPAIR ILIAC ARTERY: SHX6216

## 2023-07-29 HISTORY — PX: ULTRASOUND GUIDANCE FOR VASCULAR ACCESS: SHX6516

## 2023-07-29 HISTORY — PX: ABDOMINAL AORTIC ENDOVASCULAR STENT GRAFT: SHX5707

## 2023-07-29 LAB — MAGNESIUM: Magnesium: 1.8 mg/dL (ref 1.7–2.4)

## 2023-07-29 LAB — BASIC METABOLIC PANEL
Anion gap: 9 (ref 5–15)
BUN: 11 mg/dL (ref 8–23)
CO2: 25 mmol/L (ref 22–32)
Calcium: 8.5 mg/dL — ABNORMAL LOW (ref 8.9–10.3)
Chloride: 105 mmol/L (ref 98–111)
Creatinine, Ser: 1.04 mg/dL (ref 0.61–1.24)
GFR, Estimated: >60 mL/min (ref >60)
Glucose, Bld: 117 mg/dL — ABNORMAL HIGH (ref 70–99)
Potassium: 4.3 mmol/L (ref 3.5–5.1)
Sodium: 139 mmol/L (ref 135–145)

## 2023-07-29 LAB — CBC
HCT: 37.1 % — ABNORMAL LOW (ref 39.0–52.0)
Hemoglobin: 12.1 g/dL — ABNORMAL LOW (ref 13.0–17.0)
MCH: 28.7 pg (ref 26.0–34.0)
MCHC: 32.6 g/dL (ref 30.0–36.0)
MCV: 87.9 fL (ref 80.0–100.0)
Platelets: 171 10*3/uL (ref 150–400)
RBC: 4.22 MIL/uL (ref 4.22–5.81)
RDW: 13.7 % (ref 11.5–15.5)
WBC: 11 10*3/uL — ABNORMAL HIGH (ref 4.0–10.5)
nRBC: 0 % (ref 0.0–0.2)

## 2023-07-29 LAB — APTT: aPTT: 36 s (ref 24–36)

## 2023-07-29 LAB — PROTIME-INR
INR: 1.1 (ref 0.8–1.2)
Prothrombin Time: 14.7 s (ref 11.4–15.2)

## 2023-07-29 SURGERY — INSERTION, ENDOVASCULAR STENT GRAFT, AORTA, ABDOMINAL
Anesthesia: General | Site: Groin | Laterality: Right

## 2023-07-29 MED ORDER — ACETAMINOPHEN 10 MG/ML IV SOLN
1000.0000 mg | Freq: Once | INTRAVENOUS | Status: DC | PRN
  Administered 2023-07-29: 1000 mg via INTRAVENOUS

## 2023-07-29 MED ORDER — HEPARIN SODIUM (PORCINE) 5000 UNIT/ML IJ SOLN
5000.0000 [IU] | Freq: Three times a day (TID) | INTRAMUSCULAR | Status: DC
  Administered 2023-07-30: 5000 [IU] via SUBCUTANEOUS
  Filled 2023-07-29: qty 1

## 2023-07-29 MED ORDER — HEPARIN 6000 UNIT IRRIGATION SOLUTION
Status: AC
  Filled 2023-07-29: qty 500

## 2023-07-29 MED ORDER — IPRATROPIUM-ALBUTEROL 0.5-2.5 (3) MG/3ML IN SOLN
3.0000 mL | Freq: Once | RESPIRATORY_TRACT | Status: AC
  Administered 2023-07-29: 3 mL via RESPIRATORY_TRACT

## 2023-07-29 MED ORDER — PROTAMINE SULFATE 10 MG/ML IV SOLN
INTRAVENOUS | Status: AC
  Filled 2023-07-29: qty 5

## 2023-07-29 MED ORDER — PHENOL 1.4 % MT LIQD: 1.0000 | OROMUCOSAL | Status: DC | PRN

## 2023-07-29 MED ORDER — CHLORHEXIDINE GLUCONATE 0.12 % MT SOLN
15.0000 mL | Freq: Once | OROMUCOSAL | Status: AC
  Administered 2023-07-29: 15 mL via OROMUCOSAL
  Filled 2023-07-29: qty 15

## 2023-07-29 MED ORDER — MENTHOL 3 MG MT LOZG
1.0000 | LOZENGE | OROMUCOSAL | Status: DC | PRN
Start: 2023-07-29 — End: 2023-07-30
  Administered 2023-07-29 (×2): 3 mg via ORAL
  Filled 2023-07-29: qty 9

## 2023-07-29 MED ORDER — LACTATED RINGERS IV SOLN: INTRAVENOUS | Status: DC | PRN

## 2023-07-29 MED ORDER — ONDANSETRON HCL 4 MG/2ML IJ SOLN
INTRAMUSCULAR | Status: DC | PRN
  Administered 2023-07-29: 4 mg via INTRAVENOUS

## 2023-07-29 MED ORDER — ALBUTEROL SULFATE (2.5 MG/3ML) 0.083% IN NEBU
2.5000 mg | INHALATION_SOLUTION | Freq: Four times a day (QID) | RESPIRATORY_TRACT | Status: DC | PRN
  Administered 2023-07-30: 2.5 mg via RESPIRATORY_TRACT
  Filled 2023-07-29: qty 3

## 2023-07-29 MED ORDER — BISACODYL 10 MG RE SUPP: 10.0000 mg | Freq: Every day | RECTAL | Status: DC | PRN

## 2023-07-29 MED ORDER — SUGAMMADEX SODIUM 200 MG/2ML IV SOLN
INTRAVENOUS | Status: DC | PRN
  Administered 2023-07-29: 200 mg via INTRAVENOUS

## 2023-07-29 MED ORDER — ONDANSETRON HCL 4 MG/2ML IJ SOLN
INTRAMUSCULAR | Status: AC
Start: 2023-07-29 — End: ?
  Filled 2023-07-29: qty 2

## 2023-07-29 MED ORDER — ROCURONIUM BROMIDE 10 MG/ML (PF) SYRINGE
PREFILLED_SYRINGE | INTRAVENOUS | Status: AC
  Filled 2023-07-29: qty 10

## 2023-07-29 MED ORDER — HEPARIN SODIUM (PORCINE) 1000 UNIT/ML IJ SOLN
INTRAMUSCULAR | Status: AC
  Filled 2023-07-29: qty 10

## 2023-07-29 MED ORDER — ACETAMINOPHEN 10 MG/ML IV SOLN
INTRAVENOUS | Status: AC
  Filled 2023-07-29: qty 100

## 2023-07-29 MED ORDER — ROCURONIUM BROMIDE 10 MG/ML (PF) SYRINGE
PREFILLED_SYRINGE | INTRAVENOUS | Status: DC | PRN
  Administered 2023-07-29: 70 mg via INTRAVENOUS

## 2023-07-29 MED ORDER — ORAL CARE MOUTH RINSE: 15.0000 mL | OROMUCOSAL | Status: DC | PRN

## 2023-07-29 MED ORDER — PHENYLEPHRINE 80 MCG/ML (10ML) SYRINGE FOR IV PUSH (FOR BLOOD PRESSURE SUPPORT)
PREFILLED_SYRINGE | INTRAVENOUS | Status: DC | PRN
Start: 2023-07-29 — End: 2023-07-29
  Administered 2023-07-29 (×3): 160 ug via INTRAVENOUS

## 2023-07-29 MED ORDER — BRIMONIDINE TARTRATE-TIMOLOL 0.2-0.5 % OP SOLN
1.0000 [drp] | Freq: Two times a day (BID) | OPHTHALMIC | Status: DC
  Filled 2023-07-29: qty 5

## 2023-07-29 MED ORDER — POTASSIUM CHLORIDE CRYS ER 20 MEQ PO TBCR
20.0000 meq | EXTENDED_RELEASE_TABLET | Freq: Every day | ORAL | Status: AC | PRN
  Administered 2023-07-30: 20 meq via ORAL
  Filled 2023-07-29: qty 1

## 2023-07-29 MED ORDER — CHLORHEXIDINE GLUCONATE CLOTH 2 % EX PADS: 6.0000 | MEDICATED_PAD | Freq: Once | CUTANEOUS | Status: DC

## 2023-07-29 MED ORDER — TRIAMCINOLONE ACETONIDE 0.1 % EX CREA
1.0000 | TOPICAL_CREAM | Freq: Two times a day (BID) | CUTANEOUS | Status: DC | PRN
  Filled 2023-07-29: qty 15

## 2023-07-29 MED ORDER — PHENYLEPHRINE HCL-NACL 20-0.9 MG/250ML-% IV SOLN
INTRAVENOUS | Status: DC | PRN
  Administered 2023-07-29: 25 ug/min via INTRAVENOUS

## 2023-07-29 MED ORDER — VANCOMYCIN HCL IN DEXTROSE 1-5 GM/200ML-% IV SOLN
1000.0000 mg | INTRAVENOUS | Status: AC
  Administered 2023-07-29: 1000 mg via INTRAVENOUS
  Filled 2023-07-29: qty 200

## 2023-07-29 MED ORDER — HEPARIN SODIUM (PORCINE) 1000 UNIT/ML IJ SOLN
INTRAMUSCULAR | Status: DC | PRN
  Administered 2023-07-29: 2000 [IU] via INTRAVENOUS
  Administered 2023-07-29: 9000 [IU] via INTRAVENOUS

## 2023-07-29 MED ORDER — ALBUTEROL SULFATE HFA 108 (90 BASE) MCG/ACT IN AERS: 2.0000 | INHALATION_SPRAY | RESPIRATORY_TRACT | Status: DC | PRN

## 2023-07-29 MED ORDER — MOMETASONE FURO-FORMOTEROL FUM 200-5 MCG/ACT IN AERO
2.0000 | INHALATION_SPRAY | Freq: Two times a day (BID) | RESPIRATORY_TRACT | Status: DC
  Administered 2023-07-29: 2 via RESPIRATORY_TRACT
  Filled 2023-07-29: qty 8.8

## 2023-07-29 MED ORDER — PROTAMINE SULFATE 10 MG/ML IV SOLN
INTRAVENOUS | Status: DC | PRN
  Administered 2023-07-29: 50 mg via INTRAVENOUS

## 2023-07-29 MED ORDER — FENTANYL CITRATE (PF) 100 MCG/2ML IJ SOLN
INTRAMUSCULAR | Status: AC
  Filled 2023-07-29: qty 2

## 2023-07-29 MED ORDER — SUGAMMADEX SODIUM 200 MG/2ML IV SOLN
INTRAVENOUS | Status: AC
  Filled 2023-07-29: qty 2

## 2023-07-29 MED ORDER — HEPARIN 6000 UNIT IRRIGATION SOLUTION
Status: DC | PRN
  Administered 2023-07-29: 1

## 2023-07-29 MED ORDER — ASPIRIN 81 MG PO TBEC
81.0000 mg | DELAYED_RELEASE_TABLET | Freq: Every day | ORAL | Status: DC
  Administered 2023-07-30: 81 mg via ORAL
  Filled 2023-07-29: qty 1

## 2023-07-29 MED ORDER — IPRATROPIUM-ALBUTEROL 0.5-2.5 (3) MG/3ML IN SOLN
RESPIRATORY_TRACT | Status: AC
Start: 2023-07-29 — End: 2023-07-29
  Filled 2023-07-29: qty 3

## 2023-07-29 MED ORDER — FINASTERIDE 5 MG PO TABS
5.0000 mg | ORAL_TABLET | Freq: Every day | ORAL | Status: DC
  Administered 2023-07-30: 5 mg via ORAL
  Filled 2023-07-29: qty 1

## 2023-07-29 MED ORDER — ACETAMINOPHEN 325 MG PO TABS
325.0000 mg | ORAL_TABLET | ORAL | Status: DC | PRN
  Filled 2023-07-29: qty 2

## 2023-07-29 MED ORDER — FENTANYL CITRATE (PF) 250 MCG/5ML IJ SOLN
INTRAMUSCULAR | Status: DC | PRN
  Administered 2023-07-29 (×2): 75 ug via INTRAVENOUS

## 2023-07-29 MED ORDER — SODIUM CHLORIDE 0.9 % IV SOLN: INTRAVENOUS | Status: DC

## 2023-07-29 MED ORDER — LIDOCAINE 2% (20 MG/ML) 5 ML SYRINGE
INTRAMUSCULAR | Status: DC | PRN
  Administered 2023-07-29: 100 mg via INTRAVENOUS

## 2023-07-29 MED ORDER — AMISULPRIDE (ANTIEMETIC) 5 MG/2ML IV SOLN: 10.0000 mg | Freq: Once | INTRAVENOUS | Status: DC | PRN

## 2023-07-29 MED ORDER — POLYETHYLENE GLYCOL 3350 17 G PO PACK: 17.0000 g | PACK | Freq: Every day | ORAL | Status: DC | PRN

## 2023-07-29 MED ORDER — IODIXANOL 320 MG/ML IV SOLN
INTRAVENOUS | Status: DC | PRN
  Administered 2023-07-29: 150 mL via INTRA_ARTERIAL

## 2023-07-29 MED ORDER — CLOTRIMAZOLE 1 % EX CREA
1.0000 | TOPICAL_CREAM | Freq: Two times a day (BID) | CUTANEOUS | Status: DC | PRN
  Filled 2023-07-29: qty 15

## 2023-07-29 MED ORDER — PROPOFOL 10 MG/ML IV BOLUS
INTRAVENOUS | Status: AC
  Filled 2023-07-29: qty 20

## 2023-07-29 MED ORDER — TIMOLOL MALEATE 0.5 % OP SOLN
1.0000 [drp] | Freq: Two times a day (BID) | OPHTHALMIC | Status: DC
  Administered 2023-07-29 – 2023-07-30 (×3): 1 [drp] via OPHTHALMIC
  Filled 2023-07-29: qty 5

## 2023-07-29 MED ORDER — SODIUM CHLORIDE 0.9 % IV SOLN: 500.0000 mL | Freq: Once | INTRAVENOUS | Status: DC | PRN

## 2023-07-29 MED ORDER — MORPHINE SULFATE (PF) 2 MG/ML IV SOLN: 2.0000 mg | INTRAVENOUS | Status: DC | PRN

## 2023-07-29 MED ORDER — ORAL CARE MOUTH RINSE: 15.0000 mL | Freq: Once | OROMUCOSAL | Status: AC

## 2023-07-29 MED ORDER — OXYCODONE-ACETAMINOPHEN 5-325 MG PO TABS
1.0000 | ORAL_TABLET | ORAL | Status: DC | PRN
  Administered 2023-07-29 – 2023-07-30 (×2): 2 via ORAL
  Filled 2023-07-29 (×2): qty 2

## 2023-07-29 MED ORDER — GUAIFENESIN-DM 100-10 MG/5ML PO SYRP: 15.0000 mL | ORAL_SOLUTION | ORAL | Status: DC | PRN

## 2023-07-29 MED ORDER — BRIMONIDINE TARTRATE 0.2 % OP SOLN
1.0000 [drp] | Freq: Two times a day (BID) | OPHTHALMIC | Status: DC
  Administered 2023-07-29 – 2023-07-30 (×3): 1 [drp] via OPHTHALMIC
  Filled 2023-07-29: qty 5

## 2023-07-29 MED ORDER — PHENYLEPHRINE 80 MCG/ML (10ML) SYRINGE FOR IV PUSH (FOR BLOOD PRESSURE SUPPORT)
PREFILLED_SYRINGE | INTRAVENOUS | Status: AC
Start: 2023-07-29 — End: ?
  Filled 2023-07-29: qty 10

## 2023-07-29 MED ORDER — VANCOMYCIN HCL IN DEXTROSE 1-5 GM/200ML-% IV SOLN
1000.0000 mg | Freq: Two times a day (BID) | INTRAVENOUS | Status: AC
  Administered 2023-07-29 – 2023-07-30 (×2): 1000 mg via INTRAVENOUS
  Filled 2023-07-29 (×2): qty 200

## 2023-07-29 MED ORDER — ALUM & MAG HYDROXIDE-SIMETH 200-200-20 MG/5ML PO SUSP: 15.0000 mL | ORAL | Status: DC | PRN

## 2023-07-29 MED ORDER — SODIUM CHLORIDE 0.9 % IV SOLN: INTRAVENOUS | Status: AC

## 2023-07-29 MED ORDER — ACETAMINOPHEN 650 MG RE SUPP: 325.0000 mg | RECTAL | Status: DC | PRN

## 2023-07-29 MED ORDER — ONDANSETRON HCL 4 MG/2ML IJ SOLN: 4.0000 mg | Freq: Four times a day (QID) | INTRAMUSCULAR | Status: DC | PRN

## 2023-07-29 MED ORDER — FENTANYL CITRATE (PF) 100 MCG/2ML IJ SOLN
25.0000 ug | INTRAMUSCULAR | Status: DC | PRN
  Administered 2023-07-29: 50 ug via INTRAVENOUS
  Administered 2023-07-29: 25 ug via INTRAVENOUS
  Administered 2023-07-29: 50 ug via INTRAVENOUS
  Administered 2023-07-29: 25 ug via INTRAVENOUS

## 2023-07-29 MED ORDER — ALFUZOSIN HCL ER 10 MG PO TB24
10.0000 mg | ORAL_TABLET | Freq: Every day | ORAL | Status: DC
  Administered 2023-07-30: 10 mg via ORAL
  Filled 2023-07-29: qty 1

## 2023-07-29 MED ORDER — LORATADINE 10 MG PO TABS
10.0000 mg | ORAL_TABLET | Freq: Every day | ORAL | Status: DC
Start: 2023-07-30 — End: 2023-07-30
  Administered 2023-07-30: 10 mg via ORAL
  Filled 2023-07-29: qty 1

## 2023-07-29 MED ORDER — PROPOFOL 10 MG/ML IV BOLUS
INTRAVENOUS | Status: DC | PRN
  Administered 2023-07-29: 130 mg via INTRAVENOUS

## 2023-07-29 MED ORDER — LIDOCAINE 2% (20 MG/ML) 5 ML SYRINGE
INTRAMUSCULAR | Status: AC
  Filled 2023-07-29: qty 5

## 2023-07-29 MED ORDER — LORAZEPAM 0.5 MG PO TABS
0.5000 mg | ORAL_TABLET | Freq: Every evening | ORAL | Status: DC | PRN
  Administered 2023-07-29: 0.5 mg via ORAL
  Filled 2023-07-29: qty 1

## 2023-07-29 MED ORDER — ONDANSETRON HCL 4 MG/2ML IJ SOLN: 4.0000 mg | Freq: Once | INTRAMUSCULAR | Status: DC | PRN

## 2023-07-29 MED ORDER — HYDRALAZINE HCL 20 MG/ML IJ SOLN: 5.0000 mg | INTRAMUSCULAR | Status: DC | PRN

## 2023-07-29 MED ORDER — PRAVASTATIN SODIUM 40 MG PO TABS: 40.0000 mg | ORAL_TABLET | Freq: Every day | ORAL | Status: DC

## 2023-07-29 MED ORDER — DOCUSATE SODIUM 100 MG PO CAPS
100.0000 mg | ORAL_CAPSULE | Freq: Every day | ORAL | Status: DC
  Administered 2023-07-30: 100 mg via ORAL
  Filled 2023-07-29: qty 1

## 2023-07-29 MED ORDER — FENTANYL CITRATE (PF) 250 MCG/5ML IJ SOLN
INTRAMUSCULAR | Status: AC
  Filled 2023-07-29: qty 5

## 2023-07-29 MED ORDER — MAGNESIUM SULFATE 2 GM/50ML IV SOLN: 2.0000 g | Freq: Every day | INTRAVENOUS | Status: DC | PRN

## 2023-07-29 MED ORDER — PANTOPRAZOLE SODIUM 40 MG PO TBEC
40.0000 mg | DELAYED_RELEASE_TABLET | Freq: Every day | ORAL | Status: DC
  Administered 2023-07-29 – 2023-07-30 (×2): 40 mg via ORAL
  Filled 2023-07-29 (×2): qty 1

## 2023-07-29 SURGICAL SUPPLY — 54 items
BAG COUNTER SPONGE SURGICOUNT (BAG) ×4 IMPLANT
CANISTER SUCT 3000ML PPV (MISCELLANEOUS) ×4 IMPLANT
CATH BEACON 5.038 65CM KMP-01 (CATHETERS) ×4 IMPLANT
CATH OMNI FLUSH .035X70CM (CATHETERS) ×4 IMPLANT
CATH VISIONS PV .035 IVUS (CATHETERS) IMPLANT
CLIP TI MEDIUM 24 (CLIP) IMPLANT
CLIP TI WIDE RED SMALL 24 (CLIP) IMPLANT
DERMABOND ADVANCED .7 DNX12 (GAUZE/BANDAGES/DRESSINGS) ×4 IMPLANT
DEVICE CLOSURE PERCLS PRGLD 6F (VASCULAR PRODUCTS) ×16 IMPLANT
DRSG TEGADERM 2-3/8X2-3/4 SM (GAUZE/BANDAGES/DRESSINGS) ×8 IMPLANT
ELECT REM PT RETURN 9FT ADLT (ELECTROSURGICAL) ×8 IMPLANT
ELECTRODE REM PT RTRN 9FT ADLT (ELECTROSURGICAL) ×8 IMPLANT
EXCLDR TRNK ENDO 32X14.5X14 18 (Endovascular Graft) ×4 IMPLANT
EXCLUDER TNK END 32X14.5X14 18 (Endovascular Graft) IMPLANT
GAUZE SPONGE 2X2 8PLY STRL LF (GAUZE/BANDAGES/DRESSINGS) ×4 IMPLANT
GLOVE BIO SURGEON STRL SZ7.5 (GLOVE) ×4 IMPLANT
GLOVE BIOGEL PI IND STRL 8 (GLOVE) ×4 IMPLANT
GOWN STRL REUS W/ TWL LRG LVL3 (GOWN DISPOSABLE) ×12 IMPLANT
GOWN STRL REUS W/ TWL XL LVL3 (GOWN DISPOSABLE) ×4 IMPLANT
GRAFT BALLN CATH 65CM (BALLOONS) ×4 IMPLANT
KIT BASIN OR (CUSTOM PROCEDURE TRAY) ×4 IMPLANT
KIT TURNOVER KIT B (KITS) ×4 IMPLANT
LEG CONTRALATERAL 16X14.5X10 (Vascular Products) IMPLANT
LOOP VASCLR MAXI BLUE 18IN ST (MISCELLANEOUS) IMPLANT
LOOP VESSEL MAXI BLUE 1X16 (MISCELLANEOUS) IMPLANT
LOOP VESSEL MINI RED (MISCELLANEOUS) IMPLANT
LOOPS VASCLR MAXI BLUE 18IN ST (MISCELLANEOUS) ×4 IMPLANT
NS IRRIG 1000ML POUR BTL (IV SOLUTION) ×4 IMPLANT
PACK ENDOVASCULAR (PACKS) ×4 IMPLANT
PAD ARMBOARD POSITIONER FOAM (MISCELLANEOUS) ×8 IMPLANT
PENCIL BUTTON HOLSTER BLD 10FT (ELECTRODE) IMPLANT
PERCLOSE PROGLIDE 6F (VASCULAR PRODUCTS) ×16 IMPLANT
SET MICROPUNCTURE 5F STIFF (MISCELLANEOUS) ×4 IMPLANT
SHEATH BRITE TIP 8FR 23CM (SHEATH) ×4 IMPLANT
SHEATH DRYSEAL FLEX 12FR 33CM (SHEATH) IMPLANT
SHEATH DRYSEAL FLEX 18FR 33CM (SHEATH) IMPLANT
SHEATH PINNACLE 6F 10CM (SHEATH) IMPLANT
SHEATH PINNACLE 8F 10CM (SHEATH) ×4 IMPLANT
SLEEVE ISOL F/PACE RF HD COVER (MISCELLANEOUS) IMPLANT
STOPCOCK MORSE 400PSI 3WAY (MISCELLANEOUS) ×4 IMPLANT
SUT MNCRL AB 4-0 PS2 18 (SUTURE) ×4 IMPLANT
SUT PROLENE 5 0 C 1 24 (SUTURE) IMPLANT
SUT PROLENE 6 0 BV (SUTURE) IMPLANT
SUT SILK 2-0 18XBRD TIE 12 (SUTURE) IMPLANT
SUT SILK 3-0 18XBRD TIE 12 (SUTURE) IMPLANT
SUT VIC AB 2-0 CT1 TAPERPNT 27 (SUTURE) IMPLANT
SUT VIC AB 3-0 SH 27X BRD (SUTURE) IMPLANT
SYR 20ML LL LF (SYRINGE) ×4 IMPLANT
TIE VASCULAR MAXI BLUE 18IN ST (MISCELLANEOUS) ×4 IMPLANT
TOWEL GREEN STERILE (TOWEL DISPOSABLE) ×4 IMPLANT
TRAY FOLEY MTR SLVR 16FR STAT (SET/KITS/TRAYS/PACK) ×4 IMPLANT
TUBING HIGH PRESSURE 120CM (CONNECTOR) ×4 IMPLANT
WIRE AMPLATZ SS-J .035X180CM (WIRE) ×8 IMPLANT
WIRE BENTSON .035X145CM (WIRE) ×8 IMPLANT

## 2023-07-29 NOTE — H&P (Signed)
 History and Physical Interval Note:  07/29/2023 7:30 AM  [name] [name]  has presented today for surgery, with the diagnosis of INFRARENAL ABDOMINAL AORTIC ANEURYSM WITHOUT RUPTURE.  The various methods of treatment have been discussed with the patient and family. After consideration of risks, benefits and other options for treatment, the patient has consented to  Procedure(s): INSERTION, ENDOVASCULAR STENT GRAFT, AORTA, ABDOMINAL (N/A) as a surgical intervention.  The patient's history has been reviewed, patient examined, no change in status, stable for surgery.  I have reviewed the patient's chart and labs.  Questions were answered to the patient's satisfaction.     Cephus Shelling     Patient name: [name] [name] MRN: [id number]        DOB: [birth year]          Sex: male   REASON FOR CONSULT: 76-month follow-up for 5.1 cm AAA   HPI: [name] [name] is a 83 y.o. male, with history of coronary artery disease status post MI, COPD, hypertension, hyperlipidemia that presents for 68-month follow-up of 5.1 cm AAA.  He was previously seen by Dr. Arbie Cookey on 07/21/2022 with a 5.43 cm abdominal aortic aneurysm.  Dr. Arbie Cookey discussed with him that his size for repair was "on the fence".  He recommended follow-up in 6 months with CTA.  I last saw him after CTA on 12/28/2022 showing a 5.1 cm AAA.   Today reports no new abdominal or back pain.  Did have a CT abdomen pelvis on 07/07/2023 for further workup of hematuria showing his aneurysm had increased to 6 cm.       Past Medical History:  Diagnosis Date   AAA (abdominal aortic aneurysm) (HCC)      needs yearly ultrasound   Allergy     Anemia     Arthritis     Asthma     BCC (basal cell carcinoma) 08/18/1989    left shoulder blad, upper right arm, left arm beyond elbow, c&d   BCC (basal cell carcinoma) 01/31/1992    Posterior neck, curetx3, 54fu   BCC (basal cell carcinoma) 11/22/2001    mid forehead, cx3, excision, right forearm, cx3, 72fu   BCC (basal  cell carcinoma) 10/09/2003    mid forehead, MOHs   BCC (basal cell carcinoma) 08/15/2008    upper left back, biopsy   BPH (benign prostatic hyperplasia)     CAD (coronary artery disease)     Cancer (HCC)      skin cancer   COPD (chronic obstructive pulmonary disease) (HCC)     Dysrhythmia      pt. states it can be fast at times   GERD (gastroesophageal reflux disease)     Glaucoma     History of acute pancreatitis 12/21/2022   HOH (hard of hearing)     Hypercholesterolemia     Hypertension     Impaired fasting glucose     Low back pain     Melanoma in situ (HCC) 10/09/2003    left chin, MOHs   MI (myocardial infarction) (HCC) 1999   SCC (squamous cell carcinoma) 07/03/2014    in situ, behind left ear, cx3, cautery, 87fu   SCC (squamous cell carcinoma) 07/03/2014    well diff, left forearm, biopsy, cx1, cautery   SCC (squamous cell carcinoma) 07/20/2017    in situ, left upper arm, cx3, 63fu   SCC (squamous cell carcinoma) 01/10/2019    in situ, left post shoulder, cx3, 47fu   SCC (squamous cell carcinoma)  11/22/2001    left forearm distal, left forearm, cx3, 30fu   SCC (squamous cell carcinoma) 10/09/2003    [place], left ear post, clear per st, right cheek clear   SCC (squamous cell carcinoma) 03/30/2004    in situ, left upper arm, cx3, 83fu   SCC (squamous cell carcinoma) 03/08/2005    in situ, right cheek, mid upper forehead, cx3, 29fu   SCC (squamous cell carcinoma) 06/08/2006    in situ, left shoulder, cx3, 59fu   SCC (squamous cell carcinoma) 05/05/2010    right inner wrist, biopsy   SCC (squamous cell carcinoma) 09/13/2013    in situ, right crown scalp, front scalp, biopsy   Thrush                 Past Surgical History:  Procedure Laterality Date   BACK SURGERY        x 3   CARDIAC CATHETERIZATION        angioplasty   CATARACT EXTRACTION W/PHACO   03/20/2012    Procedure: CATARACT EXTRACTION PHACO AND INTRAOCULAR LENS PLACEMENT (IOC);  Surgeon: Susa Simmonds,  MD;  Location: [facility];  Service: Ophthalmology;  Laterality: Right;  CDE:  8.45   CATARACT EXTRACTION W/PHACO Left 04/02/2013    Procedure: CATARACT EXTRACTION PHACO AND INTRAOCULAR LENS PLACEMENT (IOC);  Surgeon: Susa Simmonds, MD;  Location: [facility];  Service: Ophthalmology;  Laterality: Left;  CDE:  6.50   CHOLECYSTECTOMY   2000   COLONOSCOPY   2009    repeat 5 years   ESOPHAGOGASTRODUODENOSCOPY       HERNIA REPAIR Left      inguinal   INGUINAL HERNIA REPAIR Right 03/28/2020    Procedure: Right Inguinal Herniorrhaphy with Mesh;  Surgeon: Franky Macho, MD;  Location: [facility];  Service: General;  Laterality: Right;   LAPAROSCOPIC PARTIAL COLECTOMY N/A 06/11/2013    Procedure: LAPAROSCOPIC HAND ASSISTED PARTIAL COLECTOMY;  Surgeon: Dalia Heading, MD;  Location: [facility];  Service: General;  Laterality: N/A;   NASAL ENDOSCOPY WITH EPISTAXIS CONTROL Bilateral 02/11/2020    Procedure: NASAL ENDOSCOPY WITH EPISTAXIS CONTROL;  Surgeon: Newman Pies, MD;  Location: [facility];  Service: ENT;  Laterality: Bilateral;   right eye detached retina Bilateral     SPINAL FUSION   2016   YAG LASER APPLICATION Left 05/06/2014    Procedure: YAG LASER APPLICATION;  Surgeon: Susa Simmonds, MD;  Location: [facility];  Service: Ophthalmology;  Laterality: Left;               Family History  Problem Relation Age of Onset   Hypertension Mother     COPD Father     Cancer Brother          brain          SOCIAL HISTORY: Social History         Socioeconomic History   Marital status: Widowed      Spouse name: Not on file   Number of children: Not on file   Years of education: Not on file   Highest education level: Not on file  Occupational History   Not on file  Tobacco Use   Smoking status: Former      Current packs/day: 0.00      Average packs/day: 1 pack/day for 35.0 years (35.0 ttl pk-yrs)      Types: Cigarettes      Start date: 03/27/1968      Quit date: 03/28/2003  Years  since quitting: 20.3   Smokeless tobacco: Never  Vaping Use   Vaping status: Never Used  Substance and Sexual Activity   Alcohol use: No      Alcohol/week: 0.0 standard drinks of alcohol   Drug use: No   Sexual activity: Not Currently      Birth control/protection: None  Other Topics Concern   Not on file  Social History Narrative   Not on file    Social Drivers of Health        Financial Resource Strain: Low Risk  (03/18/2022)    Overall Financial Resource Strain (CARDIA)     Difficulty of Paying Living Expenses: Not very hard  Food Insecurity: No Food Insecurity (03/18/2022)    Hunger Vital Sign     Worried About Running Out of Food in the Last Year: Never true     Ran Out of Food in the Last Year: Never true  Transportation Needs: No Transportation Needs (03/18/2022)    PRAPARE - Therapist, art (Medical): No     Lack of Transportation (Non-Medical): No  Physical Activity: Insufficiently Active (03/18/2022)    Exercise Vital Sign     Days of Exercise per Week: 2 days     Minutes of Exercise per Session: 20 min  Stress: No Stress Concern Present (03/18/2022)    [employer] of Occupational Health - Occupational Stress Questionnaire     Feeling of Stress : Not at all  Social Connections: Socially Isolated (03/18/2022)    Social Connection and Isolation Panel [NHANES]     Frequency of Communication with Friends and Family: More than three times a week     Frequency of Social Gatherings with Friends and Family: Three times a week     Attends Religious Services: Never     Active Member of Clubs or Organizations: No     Attends Banker Meetings: Never     Marital Status: Widowed  Intimate Partner Violence: Not At Risk (03/18/2022)    Humiliation, Afraid, Rape, and Kick questionnaire     Fear of Current or Ex-Partner: No     Emotionally Abused: No     Physically Abused: No     Sexually Abused: No      Allergies       Allergies   Allergen Reactions   Bactrim [Sulfamethoxazole-Trimethoprim] Hives   Other Anaphylaxis and Other (See Comments)   Beta Adrenergic Blockers Diarrhea and Other (See Comments)      Does not recall an allergy   Lasix [Furosemide] Rash   Doxycycline Swelling      Lips swelling and skin peeling around mouth   Myrbetriq [Mirabegron] Hives and Itching   Penicillin G        Other reaction(s): Unknown   Penicillin G Sodium        Other reaction(s): Unknown   Ciprofloxacin Nausea And Vomiting, Rash and Other (See Comments)      Body aches     Dexamethasone Swelling   Gabapentin Swelling   Methocarbamol Swelling and Rash   Neomycin Other (See Comments)      Other reaction(s): Unknown   Penicillins Swelling and Rash      Has patient had a PCN reaction causing immediate rash, facial/tongue/throat swelling, SOB or lightheadedness with hypotension: yes Has patient had a PCN reaction causing severe rash involving mucus membranes or skin necrosis: unknown Has patient had a PCN reaction that required hospitalization: no Has patient had  a PCN reaction occurring within the last 10 years: No If all of the above answers are "NO", then may proceed with Cephalosporin use.     Sulfamethoxazole Rash   Tetracyclines & Related Itching              Current Outpatient Medications  Medication Sig Dispense Refill   acetaminophen (TYLENOL) 500 MG tablet Take 500 mg by mouth every 6 (six) hours as needed.       albuterol (PROVENTIL) (2.5 MG/3ML) 0.083% nebulizer solution Take 3 mLs (2.5 mg total) by nebulization every 6 (six) hours as needed for wheezing or shortness of breath. 360 mL 5   albuterol (VENTOLIN HFA) 108 (90 Base) MCG/ACT inhaler Inhale 2 puffs into the lungs every 4 (four) hours as needed for wheezing or shortness of breath. 18 each 0   alfuzosin (UROXATRAL) 10 MG 24 hr tablet Take 1 tablet (10 mg total) by mouth daily with breakfast. 30 tablet 0   azithromycin (ZITHROMAX) 250 MG tablet Take 2  on day one then 1 daily x 4 days 6 tablet 5   cefpodoxime (VANTIN) 100 MG tablet Take 1 tablet (100 mg total) by mouth 2 (two) times daily. 56 tablet 0   cetirizine (ZYRTEC) 10 MG tablet Take 10 mg by mouth daily.       clotrimazole (LOTRIMIN) 1 % cream Apply to affected area 2 times daily 15 g 0   COMBIGAN 0.2-0.5 % ophthalmic solution Apply 1 drop to eye 2 (two) times daily.       esomeprazole (NEXIUM) 20 MG capsule Take 20 mg by mouth daily.       finasteride (PROSCAR) 5 MG tablet Take 1 tablet (5 mg total) by mouth daily. 90 tablet 3   fluticasone-salmeterol (ADVAIR HFA) 230-21 MCG/ACT inhaler Inhale 2 puffs into the lungs 2 (two) times daily.       guaiFENesin (MUCINEX) 600 MG 12 hr tablet Take by mouth 2 (two) times daily.       LORazepam (ATIVAN) 0.5 MG tablet TAKE 1 TABLET BY MOUTH ONCE DAILY AT BEDTIME AS NEEDED 30 tablet 3   lovastatin (MEVACOR) 40 MG tablet Take 1 tablet (40 mg total) by mouth daily. 90 tablet 0   nystatin (MYCOSTATIN) 100000 UNIT/ML suspension Take 5 mLs (500,000 Units total) by mouth 4 (four) times daily. Swish and swallow. 140 mL 0   oxymetazoline (AFRIN) 0.05 % nasal spray Place 1 spray into both nostrils 2 (two) times daily.       Spacer/Aero-Holding Rudean Curt Use as directed 1 each 0      No current facility-administered medications for this visit.        REVIEW OF SYSTEMS:  [X]  denotes positive finding, [ ]  denotes negative finding Cardiac   Comments:  Chest pain or chest pressure:      Shortness of breath upon exertion:      Short of breath when lying flat:      Irregular heart rhythm:             Vascular      Pain in calf, thigh, or hip brought on by ambulation:      Pain in feet at night that wakes you up from your sleep:       Blood clot in your veins:      Leg swelling:              Pulmonary      Oxygen at home:  Productive cough:       Wheezing:              Neurologic      Sudden weakness in arms or legs:       Sudden  numbness in arms or legs:       Sudden onset of difficulty speaking or slurred speech:      Temporary loss of vision in one eye:       Problems with dizziness:              Gastrointestinal      Blood in stool:       Vomited blood:              Genitourinary      Burning when urinating:       Blood in urine:             Psychiatric      Major depression:              Hematologic      Bleeding problems:      Problems with blood clotting too easily:             Skin      Rashes or ulcers:             Constitutional      Fever or chills:          PHYSICAL EXAM: There were no vitals filed for this visit.   GENERAL: The patient is a well-nourished male, in no acute distress. The vital signs are documented above. CARDIAC: There is a regular rate and rhythm.  VASCULAR:  Bilateral femoral pulses palpable Bilateral DP pulses palpable PULMONARY: No respiratory distress. ABDOMEN: Soft and non-tender. MUSCULOSKELETAL: There are no major deformities or cyanosis. NEUROLOGIC: No focal weakness or paresthesias are detected. SKIN: There are no ulcers or rashes noted. PSYCHIATRIC: The patient has a normal affect.   DATA:    CT abdomen pelvis 07/07/2023 with 5.7 cm AAA by my measurement   CTA reviewed 12/28/22: 5.1 cm AAA by my measurement   Assessment/Plan:   83 y.o. male, with history of coronary artery disease status post MI, COPD, hypertension, hyperlipidemia that presents for 12-month follow-up of 5.1 cm AAA.  I discussed that his aneurysm has increased now measuring 5.7 cm in maximal diameter by my measurement  I discussed current guidelines are to recommend repair greater than 5.5 cm in men.  I would recommend repair.  Unfortunately he has significant amount of mural thrombus at the level of the renal arteries they can make this technically difficult.  There is a neck that is quite long and I think the option would be using a traditional endograft that we will land short of the  renal arteries and avoid the mural thrombus.  We should still get seal without getting close to the renal arteries with traditional endograft given long neck.  There would still be risk of embolization including into the renals causing renal insufficiency and/or dialysis and into the lower extremities and/or mesenteric arteries as we discussed today.  I discussed all the risk and benefits including risk of anesthesia including vessel injury sometimes requiring groin cutdown and open conversion as well as risk of MI and CVA.  Will get scheduled his convenience.  I discussed he has a 10 to 15% yearly rupture risk at current size.     Cephus Shelling, MD [facility] Office: [phone]

## 2023-07-29 NOTE — Discharge Instructions (Addendum)
°[facility]  ° °Discharge Instructions ° °Endovascular Aortic Aneurysm Repair ° °Please refer to the following instructions for your post-procedure care. Your surgeon or Physician Assistant will discuss any changes with you. ° °Activity ° °You are encouraged to walk as much as you can. You can slowly return to normal activities but must avoid strenuous activity and heavy lifting until your doctor tells you it's OK. Avoid activities such as vacuuming or swinging a gold club. It is normal to feel tired for several weeks after your surgery. Do not drive until your doctor gives the OK and you are no longer taking prescription pain medications. It is also normal to have difficulty with sleep habits, eating, and bowel movements after surgery. These will go away with time. ° °Bathing/Showering ° °Shower daily after you go home.  Do not soak in a bathtub, hot tub, or swim until the incision heals completely. ° °If you have incisions in your groin, wash the groin wounds with soap and water daily and pat dry. (No tub bath-only shower)  Then put a dry gauze or washcloth there to keep this area dry to help prevent wound infection daily and as needed.  Do not use Vaseline or neosporin on your incisions.  Only use soap and water on your incisions and then protect and keep dry. ° °Incision Care ° °Shower every day. Clean your incision with mild soap and water. Pat the area dry with a clean towel. You do not need a bandage unless otherwise instructed. Do not apply any ointments or creams to your incision. If you clothing is irritating, you may cover your incision with a dry gauze pad. ° °Wash the groin wound with soap and water daily and pat dry. (No tub bath-only shower)  Then put a dry gauze or washcloth there to keep this area dry daily and as needed.  Do not use Vaseline or neosporin on your incisions.  Only use soap and water on your incisions and then protect and keep dry. ° ° °Diet ° °Resume  your normal diet. There are no special food restrictions following this procedure. A low fat/low cholesterol diet is recommended for all patients with vascular disease. In order to heal from your surgery, it is CRITICAL to get adequate nutrition. Your body requires vitamins, minerals, and protein. Vegetables are the best source of vitamins and minerals. Vegetables also provide the perfect balance of protein. Processed food has little nutritional value, so try to avoid this. ° °Medications ° °Resume taking all of your medications unless your doctor or nurse practitioner tells you not to. If your incision is causing pain, you may take over-the-counter pain relievers such as acetaminophen (Tylenol). If you were prescribed a stronger pain medication, please be aware these medications can cause nausea and constipation. Prevent nausea by taking the medication with a snack or meal. Avoid constipation by drinking plenty of fluids and eating foods with a high amount of fiber, such as fruits, vegetables, and grains.  °Do not take Tylenol if you are taking prescription pain medications. ° ° °Follow up ° °Our office will schedule a follow-up appointment with a CT scan 3-4 weeks after your surgery. ° °Please call us immediately for any of the following conditions ° °• Severe or worsening pain in your legs or feet or in your abdomen back or chest. °• Increased pain, redness, drainage (pus) from your incision site. °• Increased abdominal pain, bloating, nausea, vomiting or persistent diarrhea. °• Fever of 101   degrees or higher. °• Swelling in your leg (s), °•  °Reduce your risk of vascular disease ° °•Stop smoking. If you would like help call [facility] at 1-800-QUIT-NOW (1-800-784-8669) or [facility] at [phone]. °•Manage your cholesterol °•Maintain a desired weight °•Control your diabetes °•Keep your blood pressure down ° °If you have questions, please call the office at [phone]. ° °

## 2023-07-29 NOTE — Anesthesia Procedure Notes (Signed)
 Procedure Name: Intubation Date/Time: 07/29/2023 7:52 AM  Performed by: Georgianne Fick D, CRNAPre-anesthesia Checklist: Patient identified, Emergency Drugs available, Suction available and Patient being monitored Patient Re-evaluated:Patient Re-evaluated prior to induction Oxygen Delivery Method: Circle System Utilized Preoxygenation: Pre-oxygenation with 100% oxygen Induction Type: IV induction Ventilation: Mask ventilation without difficulty Laryngoscope Size: Miller and 3 Grade View: Grade I Tube type: Oral Tube size: 7.5 mm Number of attempts: 1 Airway Equipment and Method: Stylet and Oral airway Placement Confirmation: ETT inserted through vocal cords under direct vision, positive ETCO2 and breath sounds checked- equal and bilateral Secured at: 23 cm Tube secured with: Tape Dental Injury: Teeth and Oropharynx as per pre-operative assessment

## 2023-07-29 NOTE — Op Note (Signed)
 Date: July 29, 2023  Preoperative diagnosis: 5.7 cm infrarenal abdominal aortic aneurysm  Postoperative diagnosis: Same  Procedure: 1.  Percutaneous access of bilateral common femoral arteries with Perclose closure for delivery of endograft (52841) 2.  Selective catheter placement of abdominal aorta with abdominal aortogram (32440) 3.  Intravascular ultrasound noncoronary of the suprarenal abdominal aorta and infrarenal abdominal aorta (37252/37253) 4.  Treatment of abdominal aortic aneurysm with stent graft repair using aortobiiliac endograft (10272) 5.  Cutdown on the right common femoral artery for primary repair after Perclose issue (53664)  Surgeon: Dr. Cephus Shelling, MD  Assistant: Dr. Tarry Kos, MD  Indications: 83 year old male seen with abdominal aortic aneurysm that has been undergoing surveillance.  This has now increased in size measuring 5.7 cm in maximal diameter.  He presents for stent graft repair after risk benefits discussed.  He does have significant mural thrombus of the infrarenal aorta adjacent to the renal so we discussed landing the endograft further below the renals to avoid thrombus.  An assistant was needed given the complexity of the case and also for stent graft repair and deployment and repair of the common femoral.  Findings: Percutaneous access of bilateral common femoral arteries with Perclose closure.  18 French Gore dry seal sheath was placed in the right common femoral artery and a 12 French Gore dry seal sheath was placed in the left common femoral artery.  Initially did aortogram to identify the lowest left renal.  We then performed IVUS to identify the mural thrombus in the infrarenal aorta below the renals and we deployed the endograft below this to avoid the thrombus.  The main body was a 32 mm x 14 mm x 14 cm deployed on the right.  On the left after cannulating the gate we extended with a 14 mm x 10 cm into the left common iliac with  preservation of the hypogastric.  Widely patent endograft on completion imaging.  No endoleak.  Exclusion of aneurysm.  After Perclose closure we performed bilateral common femoral angiograms.  There was a high-grade stenosis on the right requiring cutdown on the common femoral artery with removal of the Perclose closure devices and primary repair of the common femoral artery.  Anesthesia: General  Details: Patient was taken to the operating room after informed consent was obtained.  Placed on operative table in the supine position.  General endotracheal anesthesia was induced.  Both groins and abdominal wall were then prepped and draped standard sterile fashion.  Antibiotics were given and timeout performed.  I initially started in the right groin with ultrasound and evaluated the common femoral artery which was patent and this was accessed with micro access needle placed a microwire and made a stab incision here and dissected down with a hemostat.  I then placed a micro sheath, Bentson wire, dilated with an 8 French dilator and placed Perclose devices at 10:00 and 2:00 in the right common femoral artery and then placed a 8 French sheath.  Dr. Karin Lieu did the same steps in the left groin again accessing the left common femoral artery with ultrasound, placing a micro access needle and then a microwire and then a microsheath ultimately placing Bentson wire and 8 French dilator and Perclose closures 10:00 and 2:00.  We then gave 100 units/kg IV heparin.  ACT was checked to maintain greater than 250.  I went and exchanged for stiff Amplatz wires in both groins using KMP catheter that was placed in the thoracic aorta.  Upsized  to an 37 [place] Gore dry seal in the right common femoral artery and a 12 French Gore dry seal in the left common femoral artery.  The main body device was then deployed up the right groin with a 32 mm x 14 mm x 14 cm.  We then came up with a pigtail catheter from the left groin and got an  abdominal aortogram.  We identified the lowest left renal artery.  We also identified where there was mural thrombus in the infrarenal neck just below the renal arteries.  I then came with an IVUS catheter to confirm where the mural thrombus ended in the infrarenal aorta and this is where we deployed the main body of the endograft all the way down to the gate.  Dr. Karin Lieu then came from the left groin with a KMP catheter and a Bentson wire and cannulated the gate.  We confirmed with IVUS that we were indeed in the true lumen.  We then got a retrograde sheath shot on the left to identify the hypogastric and then extended on the left with a 14 mm x 10 cm limb into the common iliac with preservation of the hypogastric.  We then got a retrograde sheath shot on the right again identifying the hypogastric and finished deploying the main body of the device into the right common iliac artery.  We elected not to balloon the endograft given the significant mural thrombus proximally adjacent to the renals.  Final aortogram showed no evidence of endoleak with widely patent stent graft with preservation of the hypogastrics and filling of both renals.  Then exchanged for Bentson wires in both groins.  Tied down the Perclose closure devices initially in the right groin with good hemostasis.  We put a microsheath and retrograde sheath shot showed a high-grade common femoral stenosis likely from the Perclose closure devices stenosing the artery.  This would require cutdown on the right groin.  I then did the same steps in the left groin tying down the Perclose closure devices with good hemostasis and the micro sheath was placed in a left femoral angiogram showed widely patent artery and we had good hemostasis here.  The Perclose closure devices were cut from the left groin.  Turned our attention to the right groin and made a transverse incision over a short 6 French sheath that was placed in the artery.  Dissected down and got out  the common femoral artery dissected under the inguinal ligament for proximal control and placed a vessel loop and Hneley clamp.  I then dissected distal to the arteriotomy.  We then used baby profunda vascular clamp distal to the arteriotomy.  The sheath was removed and we cut out the Perclose closure devices from the anterior wall of the right common femoral that appeared to stenosed the artery.  The lumen was widely patent once we cut away the Perclose closure devices.  I primarily repaired the arteriotomy of the right common femoral artery with 6-0 Prolene in interrupted fashion and de-aired this prior to completion.  We had a great femoral pulse and good signal in the foot.  The right groin was irrigated out and closed in multiple layers of 2-0 Vicryl, 3-0 Vicryl, 4-0 Monocryl and Dermabond.  Taken to holding in stable condition.  Complication: None  Condition: Stable  Cephus Shelling, MD [facility] [place] Office: [phone]   Cephus Shelling

## 2023-07-29 NOTE — Anesthesia Procedure Notes (Signed)
 Arterial Line Insertion Start/End3/14/2025 6:55 AM Performed by: Leonides Grills, MD, Noah Delaine, CRNA, CRNA  Patient location: Pre-op. Preanesthetic checklist: patient identified, IV checked, site marked, risks and benefits discussed, surgical consent, monitors and equipment checked, pre-op evaluation, timeout performed and anesthesia consent Lidocaine 1% used for infiltration Right, radial was placed Catheter size: 20 G Hand hygiene performed  and maximum sterile barriers used   Attempts: 1 Procedure performed without using ultrasound guided technique. Following insertion, dressing applied. Post procedure assessment: normal and unchanged  Patient tolerated the procedure well with no immediate complications.

## 2023-07-29 NOTE — Transfer of Care (Signed)
 Immediate Anesthesia Transfer of Care Note  Patient: [name] [name]  Procedure(s) Performed: INSERTION, ENDOVASCULAR STENT GRAFT, AORTA, ABDOMINAL ULTRASOUND GUIDANCE, FOR VASCULAR ACCESS (Bilateral: Groin)  Patient Location: PACU  Anesthesia Type:General  Level of Consciousness: awake, alert , and oriented  Airway & Oxygen Therapy: Patient Spontanous Breathing and Patient connected to face mask oxygen  Post-op Assessment: Report given to RN and Post -op Vital signs reviewed and stable  Post vital signs: Reviewed and stable  Last Vitals:  Vitals Value Taken Time  BP 140/91 07/29/23 1006  Temp    Pulse 82 07/29/23 1009  Resp 20 07/29/23 1009  SpO2 97 % 07/29/23 1009  Vitals shown include unfiled device data.  Last Pain:  Vitals:   07/29/23 0620  TempSrc:   PainSc: 0-No pain         Complications: No notable events documented.

## 2023-07-30 LAB — BASIC METABOLIC PANEL
Anion gap: 6 (ref 5–15)
BUN: 10 mg/dL (ref 8–23)
CO2: 24 mmol/L (ref 22–32)
Calcium: 8.2 mg/dL — ABNORMAL LOW (ref 8.9–10.3)
Chloride: 104 mmol/L (ref 98–111)
Creatinine, Ser: 0.81 mg/dL (ref 0.61–1.24)
GFR, Estimated: >60 mL/min (ref >60)
Glucose, Bld: 111 mg/dL — ABNORMAL HIGH (ref 70–99)
Potassium: 3.6 mmol/L (ref 3.5–5.1)
Sodium: 134 mmol/L — ABNORMAL LOW (ref 135–145)

## 2023-07-30 LAB — CBC
HCT: 36.3 % — ABNORMAL LOW (ref 39.0–52.0)
Hemoglobin: 11.8 g/dL — ABNORMAL LOW (ref 13.0–17.0)
MCH: 28.9 pg (ref 26.0–34.0)
MCHC: 32.5 g/dL (ref 30.0–36.0)
MCV: 88.8 fL (ref 80.0–100.0)
Platelets: 169 10*3/uL (ref 150–400)
RBC: 4.09 MIL/uL — ABNORMAL LOW (ref 4.22–5.81)
RDW: 13.8 % (ref 11.5–15.5)
WBC: 9.5 10*3/uL (ref 4.0–10.5)
nRBC: 0 % (ref 0.0–0.2)

## 2023-07-30 MED ORDER — ASPIRIN 81 MG PO TBEC: 81.0000 mg | DELAYED_RELEASE_TABLET | Freq: Every day | ORAL | 12 refills | Status: DC

## 2023-07-30 MED ORDER — HYDROCODONE-ACETAMINOPHEN 5-325 MG PO TABS: 1.0000 | ORAL_TABLET | Freq: Four times a day (QID) | ORAL | 0 refills | Status: DC | PRN

## 2023-07-30 NOTE — Progress Notes (Addendum)
  Progress Note    07/30/2023 9:20 AM 1 Day Post-Op  Subjective:  sitting up in chair.  Has voided.  Needs to walk in hallway.    Afebrile HR 70's-90's  120's-130's systolic   Vitals:   07/30/23 7829 07/30/23 0726  BP:  124/73  Pulse: 71 76  Resp: 18 18  Temp:  97.9 F (36.6 C)  SpO2: 94% 93%    Physical Exam: General:  no distress Cardiac:  regular Lungs:  non labored Incisions:  right groin incision in tact; left groin without hematoma Extremities:  1+ palpable DP pulse right and 2+ left DP pulse.  Abdomen:  soft  CBC    Component Value Date/Time   WBC 9.5 07/30/2023 0345   RBC 4.09 (L) 07/30/2023 0345   HGB 11.8 (L) 07/30/2023 0345   HGB 13.4 09/05/2020 0920   HCT 36.3 (L) 07/30/2023 0345   HCT 41.3 09/05/2020 0920   PLT 169 07/30/2023 0345   PLT 214 09/05/2020 0920   MCV 88.8 07/30/2023 0345   MCV 85 09/05/2020 0920   MCH 28.9 07/30/2023 0345   MCHC 32.5 07/30/2023 0345   RDW 13.8 07/30/2023 0345   RDW 13.8 09/05/2020 0920   LYMPHSABS 1.1 07/07/2023 1101   LYMPHSABS 1.5 02/14/2019 0907   MONOABS 0.7 07/07/2023 1101   EOSABS 0.2 07/07/2023 1101   EOSABS 0.4 02/14/2019 0907   BASOSABS 0.1 07/07/2023 1101   BASOSABS 0.1 02/14/2019 0907    BMET    Component Value Date/Time   NA 134 (L) 07/30/2023 0345   NA 137 06/29/2022 1654   K 3.6 07/30/2023 0345   CL 104 07/30/2023 0345   CO2 24 07/30/2023 0345   GLUCOSE 111 (H) 07/30/2023 0345   BUN 10 07/30/2023 0345   BUN 12 06/29/2022 1654   CREATININE 0.81 07/30/2023 0345   CREATININE 0.94 11/29/2013 1219   CALCIUM 8.2 (L) 07/30/2023 0345   GFRNONAA >60 07/30/2023 0345   GFRAA >60 12/28/2019 1546    INR    Component Value Date/Time   INR 1.1 07/29/2023 1334     Intake/Output Summary (Last 24 hours) at 07/30/2023 0920 Last data filed at 07/30/2023 0700 Gross per 24 hour  Intake 2717.39 ml  Output 1300 ml  Net 1417.39 ml      Assessment/Plan:  83 y.o. male is s/p:  EVAR with cutdown on  right CFA for primary repair after perclose issue   1 Day Post-Op   -pt right groin incision looks fine.  Left groin soft. -1+ palpable right DP pulse and 2+ left DP pulse.   -renal function looks good. -acute blood loss anemia stable and pt is tolerating.  -pt needs to ambulate in hallway.  Pt has voided. -discussed groin wound care with pt.   -f/u with Dr. Chestine Spore in 4 weeks or so with CTA a/p and see him in [place].     Doreatha Massed, PA-C [facility] [phone] 07/30/2023 9:20 AM   VASCULAR STAFF ADDENDUM: I have independently interviewed and examined the patient. I agree with the above.   Rande Brunt. Lenell Antu, MD [facility] Phone Number: [phone] 07/30/2023 12:57 PM

## 2023-07-30 NOTE — Progress Notes (Signed)
 Patient has already ambulated this morning with RN, walked approximately 445feet.   Patient discharge to home with his son. Has all his personal belongings with him and a copy of the AVS after RN and patient reviewed it thoroughly together.

## 2023-07-30 NOTE — Progress Notes (Signed)
 Patient ambulated around the unit with RN with a walker; on room air. Patient states he normally uses a cane for long distance and does have a walker at home. Will discharge patient once surgeon rounds on patient.

## 2023-07-30 NOTE — Anesthesia Postprocedure Evaluation (Signed)
 Anesthesia Post Note  Patient: [name] [name]  Procedure(s) Performed: INSERTION, ENDOVASCULAR STENT GRAFT, AORTA, ABDOMINAL ULTRASOUND GUIDANCE, FOR VASCULAR ACCESS (Bilateral: Groin) IVUS (INTRAVASCULAR ULTRASOUND) REPAIR, ARTERY, FEMORAL (Right: Groin)     Patient location during evaluation: PACU Anesthesia Type: General Level of consciousness: awake Pain management: pain level controlled Vital Signs Assessment: post-procedure vital signs reviewed and stable Respiratory status: spontaneous breathing, nonlabored ventilation and respiratory function stable Cardiovascular status: blood pressure returned to baseline and stable Postop Assessment: no apparent nausea or vomiting Anesthetic complications: no   No notable events documented.  Last Vitals:  Vitals:   07/30/23 0703 07/30/23 0726  BP:  124/73  Pulse: 71 76  Resp: 18 18  Temp:  36.6 C  SpO2: 94% 93%    Last Pain:  Vitals:   07/30/23 0745  TempSrc:   PainSc: 0-No pain                 Tykee Heideman P Lurleen Soltero

## 2023-07-30 NOTE — Discharge Summary (Signed)
 [facility] Discharge Summary   [name] [name] 1940/06/11 83 y.o. male  MRN: [id number]  Admission Date: 07/29/2023  Discharge Date: 07/30/2023  Physician: Cephus Shelling, MD  Admission Diagnosis: AAA (abdominal aortic aneurysm) (HCC) [I71.40]   HPI:   This is a 83 y.o. male seen with abdominal aortic aneurysm that has been undergoing surveillance.  This has now increased in size measuring 5.7 cm in maximal diameter.  He presents for stent graft repair after risk benefits discussed.  He does have significant mural thrombus of the infrarenal aorta adjacent to the renal so we discussed landing the endograft further below the renals to avoid thrombus.   Hospital Course:  The patient was admitted to the hospital and taken to the operating room on 07/29/2023 and underwent: 1.  Percutaneous access of bilateral common femoral arteries with Perclose closure for delivery of endograft (64403) 2.  Selective catheter placement of abdominal aorta with abdominal aortogram (47425) 3.  Intravascular ultrasound noncoronary of the suprarenal abdominal aorta and infrarenal abdominal aorta (37252/37253) 4.  Treatment of abdominal aortic aneurysm with stent graft repair using aortobiiliac endograft (95638) 5.  Cutdown on the right common femoral artery for primary repair after Perclose issue (75643)    Findings: Percutaneous access of bilateral common femoral arteries with Perclose closure.  18 French Gore dry seal sheath was placed in the right common femoral artery and a 12 French Gore dry seal sheath was placed in the left common femoral artery.  Initially did aortogram to identify the lowest left renal.  We then performed IVUS to identify the mural thrombus in the infrarenal aorta below the renals and we deployed the endograft below this to avoid the thrombus.  The main body was a 32 mm x 14 mm x 14 cm deployed on the right.  On the left after cannulating the gate we extended with a 14 mm x 10 cm into  the left common iliac with preservation of the hypogastric.  Widely patent endograft on completion imaging.  No endoleak.  Exclusion of aneurysm.  After Perclose closure we performed bilateral common femoral angiograms.  There was a high-grade stenosis on the right requiring cutdown on the common femoral artery with removal of the Perclose closure devices and primary repair of the common femoral artery.   The pt tolerated the procedure well and was transported to the PACU in good condition.   By POD 1, pt was doing well.  He had voided. He had 1+ right DP pulse and 2+ left DP pulse.  Groin wound care discussed with pt.  He ambulated in the hallways.  His renal function looks good.  Right groin incision looks good.  He is discharged home and will f/u in Dr. Larence Penning clinic with CTA a/p.    CBC    Component Value Date/Time   WBC 9.5 07/30/2023 0345   RBC 4.09 (L) 07/30/2023 0345   HGB 11.8 (L) 07/30/2023 0345   HGB 13.4 09/05/2020 0920   HCT 36.3 (L) 07/30/2023 0345   HCT 41.3 09/05/2020 0920   PLT 169 07/30/2023 0345   PLT 214 09/05/2020 0920   MCV 88.8 07/30/2023 0345   MCV 85 09/05/2020 0920   MCH 28.9 07/30/2023 0345   MCHC 32.5 07/30/2023 0345   RDW 13.8 07/30/2023 0345   RDW 13.8 09/05/2020 0920   LYMPHSABS 1.1 07/07/2023 1101   LYMPHSABS 1.5 02/14/2019 0907   MONOABS 0.7 07/07/2023 1101   EOSABS 0.2 07/07/2023 1101   EOSABS 0.4 02/14/2019  6213   BASOSABS 0.1 07/07/2023 1101   BASOSABS 0.1 02/14/2019 0907    BMET    Component Value Date/Time   NA 134 (L) 07/30/2023 0345   NA 137 06/29/2022 1654   K 3.6 07/30/2023 0345   CL 104 07/30/2023 0345   CO2 24 07/30/2023 0345   GLUCOSE 111 (H) 07/30/2023 0345   BUN 10 07/30/2023 0345   BUN 12 06/29/2022 1654   CREATININE 0.81 07/30/2023 0345   CREATININE 0.94 11/29/2013 1219   CALCIUM 8.2 (L) 07/30/2023 0345   GFRNONAA >60 07/30/2023 0345   GFRAA >60 12/28/2019 1546       Discharge Instructions      Discharge patient   Complete by: As directed    Discharge after pt has walked in the hallways and been seen by Dr. Lenell Antu   Discharge disposition: 01-Home or Self Care   Discharge patient date: 07/30/2023       Discharge Diagnosis:  AAA (abdominal aortic aneurysm) (HCC) [I71.40]  Secondary Diagnosis: Patient Active Problem List   Diagnosis Date Noted   BPH associated with nocturia 05/24/2023   Lower urinary tract symptoms (LUTS) 05/24/2023   Benign prostatic hyperplasia (BPH) with urinary urgency 05/17/2023   Chronic asthma, severe persistent, uncomplicated 05/05/2023   Celiac artery stenosis (HCC) 02/02/2023   Chronic idiopathic constipation 12/21/2022   Osteoarthritis of knees, bilateral 04/14/2022   Irritable bowel syndrome 03/17/2022   Recurrent right lower quadrant abdominal pain 03/03/2022   Seasonal allergies 08/06/2021   COPD (chronic obstructive pulmonary disease) (HCC) 04/23/2021   CAD (coronary artery disease) 04/23/2021   Hypertension 09/16/2016   Lumbar spondylosis 11/05/2014   GERD (gastroesophageal reflux disease) 01/19/2012   Hyperlipidemia 04/15/2008   AAA (abdominal aortic aneurysm) (HCC) 04/15/2008   Past Medical History:  Diagnosis Date   AAA (abdominal aortic aneurysm) (HCC)    needs yearly ultrasound   Allergy    Anemia    Arthritis    Asthma    BCC (basal cell carcinoma) 08/18/1989   left shoulder blad, upper right arm, left arm beyond elbow, c&d   BCC (basal cell carcinoma) 01/31/1992   Posterior neck, curetx3, 75fu   BCC (basal cell carcinoma) 11/22/2001   mid forehead, cx3, excision, right forearm, cx3, 75fu   BCC (basal cell carcinoma) 10/09/2003   mid forehead, MOHs   BCC (basal cell carcinoma) 08/15/2008   upper left back, biopsy   BPH (benign prostatic hyperplasia)    CAD (coronary artery disease)    Cancer (HCC)    skin cancer   COPD (chronic obstructive pulmonary disease) (HCC)    Dysrhythmia    pt. states it can be fast at times    GERD (gastroesophageal reflux disease)    Glaucoma    History of acute pancreatitis 12/21/2022   HOH (hard of hearing)    Hypercholesterolemia    Hypertension    Impaired fasting glucose    Low back pain    Melanoma in situ (HCC) 10/09/2003   left chin, MOHs   MI (myocardial infarction) (HCC) 1999   Peripheral vascular disease (HCC)    AAA   SCC (squamous cell carcinoma) 07/03/2014   in situ, behind left ear, cx3, cautery, 29fu   SCC (squamous cell carcinoma) 07/03/2014   well diff, left forearm, biopsy, cx1, cautery   SCC (squamous cell carcinoma) 07/20/2017   in situ, left upper arm, cx3, 48fu   SCC (squamous cell carcinoma) 01/10/2019   in situ, left post shoulder, cx3, 16fu  SCC (squamous cell carcinoma) 11/22/2001   left forearm distal, left forearm, cx3, 10fu   SCC (squamous cell carcinoma) 10/09/2003   Bowens, left ear post, clear per st, right cheek clear   SCC (squamous cell carcinoma) 03/30/2004   in situ, left upper arm, cx3, 75fu   SCC (squamous cell carcinoma) 03/08/2005   in situ, right cheek, mid upper forehead, cx3, 30fu   SCC (squamous cell carcinoma) 06/08/2006   in situ, left shoulder, cx3, 49fu   SCC (squamous cell carcinoma) 05/05/2010   right inner wrist, biopsy   SCC (squamous cell carcinoma) 09/13/2013   in situ, right crown scalp, front scalp, biopsy   Thrush      Allergies as of 07/30/2023       Reactions   Bactrim [sulfamethoxazole-trimethoprim] Hives   Beta Adrenergic Blockers Diarrhea, Other (See Comments)   Lasix [furosemide] Rash   Doxycycline Swelling   Lips swelling and skin peeling around mouth   Myrbetriq [mirabegron] Hives, Itching   Ciprofloxacin Nausea And Vomiting, Rash, Other (See Comments)   Body aches   Dexamethasone Swelling   Gabapentin Swelling   Methocarbamol Swelling, Rash   Neomycin Swelling, Rash   Penicillins Swelling, Rash      Tetracyclines & Related Itching        Medication List     STOP taking these  medications    ibuprofen 200 MG tablet Commonly known as: ADVIL       TAKE these medications    albuterol 108 (90 Base) MCG/ACT inhaler Commonly known as: VENTOLIN HFA Inhale 2 puffs into the lungs every 4 (four) hours as needed for wheezing or shortness of breath.   albuterol (2.5 MG/3ML) 0.083% nebulizer solution Commonly known as: PROVENTIL Take 3 mLs (2.5 mg total) by nebulization every 6 (six) hours as needed for wheezing or shortness of breath.   alfuzosin 10 MG 24 hr tablet Commonly known as: UROXATRAL Take 1 tablet (10 mg total) by mouth daily with breakfast.   ALKA-SELTZER PLUS COLD PO Take 2 tablets by mouth daily as needed (congestion).   aspirin EC 81 MG tablet Take 1 tablet (81 mg total) by mouth daily at 6 (six) AM. Swallow whole. Start taking on: July 31, 2023   cefpodoxime 100 MG tablet Commonly known as: VANTIN Take 1 tablet (100 mg total) by mouth 2 (two) times daily.   cetirizine 10 MG tablet Commonly known as: ZYRTEC Take 10 mg by mouth daily.   clotrimazole 1 % cream Commonly known as: LOTRIMIN Apply to affected area 2 times daily What changed:  how much to take how to take this when to take this reasons to take this additional instructions   Combigan 0.2-0.5 % ophthalmic solution Generic drug: brimonidine-timolol Apply 1 drop to eye 2 (two) times daily.   esomeprazole 20 MG capsule Commonly known as: NEXIUM Take 40 mg by mouth daily.   finasteride 5 MG tablet Commonly known as: PROSCAR Take 1 tablet (5 mg total) by mouth daily.   fluticasone-salmeterol 230-21 MCG/ACT inhaler Commonly known as: Advair HFA Inhale 2 puffs into the lungs 2 (two) times daily.   guaiFENesin 600 MG 12 hr tablet Commonly known as: MUCINEX Take 600 mg by mouth 2 (two) times daily as needed for cough.   HYDROcodone-acetaminophen 5-325 MG tablet Commonly known as: NORCO/VICODIN Take 1 tablet by mouth every 6 (six) hours as needed for moderate pain (pain  score 4-6).   LORazepam 0.5 MG tablet Commonly known as: ATIVAN TAKE 1 TABLET  BY MOUTH ONCE DAILY AT BEDTIME AS NEEDED   lovastatin 40 MG tablet Commonly known as: MEVACOR Take 1 tablet (40 mg total) by mouth daily.   metroNIDAZOLE 0.75 % gel Commonly known as: METROGEL Apply 1 Application topically daily as needed (rosacea).   oxymetazoline 0.05 % nasal spray Commonly known as: AFRIN Place 1 spray into both nostrils 2 (two) times daily as needed for congestion.   pseudoephedrine-guaifenesin 60-600 MG 12 hr tablet Commonly known as: MUCINEX D Take 1 tablet by mouth 2 (two) times daily as needed for congestion.   Spacer/Aero-Holding [employer] Use as directed   triamcinolone cream 0.1 % Commonly known as: KENALOG Apply 1 Application topically 2 (two) times daily as needed (itching).        Discharge Instructions:  [facility]  Discharge Instructions Endovascular Aortic Aneurysm Repair  Please refer to the following instructions for your post-procedure care. Your surgeon or Physician Assistant will discuss any changes with you.  Activity  You are encouraged to walk as much as you can. You can slowly return to normal activities but must avoid strenuous activity and heavy lifting until your doctor tells you it's OK. Avoid activities such as vacuuming or swinging a gold club. It is normal to feel tired for several weeks after your surgery. Do not drive until your doctor gives the OK and you are no longer taking prescription pain medications. It is also normal to have difficulty with sleep habits, eating, and bowel movements after surgery. These will go away with time.  Bathing/Showering  You may shower after you go home. If you have an incision, do not soak in a bathtub, hot tub, or swim until the incision heals completely.  Incision Care  Shower every day. Clean your incision with mild soap and water. Pat the area dry with a clean  towel. You do not need a bandage unless otherwise instructed. Do not apply any ointments or creams to your incision. If you clothing is irritating, you may cover your incision with a dry gauze pad.  Diet  Resume your normal diet. There are no special food restrictions following this procedure. A low fat/low cholesterol diet is recommended for all patients with vascular disease. In order to heal from your surgery, it is CRITICAL to get adequate nutrition. Your body requires vitamins, minerals, and protein. Vegetables are the best source of vitamins and minerals. Vegetables also provide the perfect balance of protein. Processed food has little nutritional value, so try to avoid this.  Medications  Resume taking all of your medications unless your doctor or Physician Assistnat tells you not to. If your incision is causing pain, you may take over-the-counter pain relievers such as acetaminophen (Tylenol). If you were prescribed a stronger pain medication, please be aware these medications can cause nausea and constipation. Prevent nausea by taking the medication with a snack or meal. Avoid constipation by drinking plenty of fluids and eating foods with a high amount of fiber, such as fruits, vegetables, and grains.  Do not take Tylenol if you are taking prescription pain medications.   Follow up  Our office will schedule a follow-up appointment with a C.T. scan 3-4 weeks after your surgery.  Please call us immediately for any of the following conditions  Severe or worsening pain in your legs or feet or in your abdomen back or chest. Increased pain, redness, drainage (pus) from your incision sit. Increased abdominal pain, bloating, nausea, vomiting or persistent diarrhea. Fever of  101 degrees or higher. Swelling in your leg (s),  Reduce your risk of vascular disease  Stop smoking. If you would like help call [facility] at 1-800-QUIT-NOW ((505)877-1011) or [place] at 775-380-6789. Manage  your cholesterol Maintain a desired weight Control your diabetes Keep your blood pressure down  If you have questions, please call the office at [phone].   Prescriptions given: 1.  Norco#8 No Refill 2.  Asa 81mg  daily  Disposition: home  Patient's condition: is Good  Follow up: 1. Dr. Chestine Spore in 4 weeks in [place] with CTA protocol   Doreatha Massed, PA-C [facility] [phone] 07/30/2023  10:44 AM   - For VQI Registry use - Post-op:  Time to Extubation: [x]  In OR, [ ]  < 12 hrs, [ ]  12-24 hrs, [ ]  >=24 hrs Vasopressors Req. Post-op: No MI: No., [ ]  Troponin only, [ ]  EKG or Clinical New Arrhythmia: No CHF: No ICU Stay: 1 day in progressive Transfusion: No     If yes, n/a units given  Complications: Resp failure: No., [ ]  Pneumonia, [ ]  Ventilator Chg in renal function: No., [ ]  Inc. Cr > 0.5, [ ]  Temp. Dialysis,  [ ]  Permanent dialysis Leg ischemia: No., no Surgery needed, [ ]  Yes, Surgery needed,  [ ]  Amputation Bowel ischemia: No., [ ]  Medical Rx, [ ]  Surgical Rx Wound complication: No., [ ]  Superficial separation/infection, [ ]  Return to OR Return to OR: No  Return to OR for bleeding: No Stroke: No., [ ]  Minor, [ ]  Major  Discharge medications: Statin use:  Yes  ASA use:  Yes  Plavix use:  No  Beta blocker use:  No  ARB use:  No ACEI use:  No CCB use:  No

## 2023-08-01 ENCOUNTER — Encounter (HOSPITAL_COMMUNITY): Payer: Self-pay | Admitting: Vascular Surgery

## 2023-08-01 ENCOUNTER — Telehealth: Payer: Self-pay | Admitting: *Deleted

## 2023-08-01 NOTE — Transitions of Care (Post Inpatient/ED Visit) (Signed)
 08/01/2023  Name: [name] [name] MRN: [id number] DOB: 12-17-1940  Today's TOC FU Call Status: Today's TOC FU Call Status:: Successful TOC FU Call Completed TOC FU Call Complete Date: 08/01/23 Patient's Name and Date of Birth confirmed.  Transition Care Management Follow-up Telephone Call Date of Discharge: 07/30/23 Discharge Facility: [facility]) Type of Discharge: Inpatient Admission Primary Inpatient Discharge Diagnosis:: AAA   stent graft repair How have you been since you were released from the hospital?: Better (eating well, no issues with bowel/ bladder, reports " doing well") Any questions or concerns?: No  Items Reviewed: Did you receive and understand the discharge instructions provided?: Yes Any new allergies since your discharge?: No Dietary orders reviewed?: Yes Type of Diet Ordered:: low sodium , heart healthy Do you have support at home?: Yes People in Home: alone Name of Support/Comfort Primary Source: lives alone, adult children come over every day and assist, they have also spent the night as needed Patient declines enrollment in TOC 30 day program Reviewed signs/ symptoms of infection, reportable signs/ symptoms Reviewed safety precautions, importance of asking for assistance as needed  Medications Reviewed Today: Medications Reviewed Today     Reviewed by Audrie Gallus, RN (Registered Nurse) on 08/01/23 at 1203  Med List Status: <None>   Medication Order Taking? Sig Documenting Provider Last Dose Status Informant  albuterol (PROVENTIL) (2.5 MG/3ML) 0.083% nebulizer solution [id number] Yes Take 3 mLs (2.5 mg total) by nebulization every 6 (six) hours as needed for wheezing or shortness of breath. Nyoka Cowden, MD Taking Active   albuterol (VENTOLIN HFA) 108 (90 Base) MCG/ACT inhaler [id number] Yes Inhale 2 puffs into the lungs every 4 (four) hours as needed for wheezing or shortness of breath. Durwin Glaze, MD Taking Active Self  alfuzosin (UROXATRAL) 10  MG 24 hr tablet [id number] Yes Take 1 tablet (10 mg total) by mouth daily with breakfast. Malen Gauze, MD Taking Active Self  aspirin EC 81 MG tablet [id number] Yes Take 1 tablet (81 mg total) by mouth daily at 6 (six) AM. Swallow whole. Dara Lords, PA-C Taking Active   cefpodoxime (VANTIN) 100 MG tablet [id number] No Take 1 tablet (100 mg total) by mouth 2 (two) times daily.  Patient not taking: Reported on 08/01/2023   Malen Gauze, MD Not Taking Active Self  cetirizine (ZYRTEC) 10 MG tablet [id number] Yes Take 10 mg by mouth daily. [provider] Taking Active Self  Chlorphen-Phenyleph-ASA (ALKA-SELTZER PLUS COLD PO) [id number] No Take 2 tablets by mouth daily as needed (congestion).  Patient not taking: Reported on 08/01/2023   [provider] Not Taking Active Self  clotrimazole (LOTRIMIN) 1 % cream [id number] Yes Apply to affected area 2 times daily  Patient taking differently: Apply 1 Application topically 2 (two) times daily as needed (itching).   Lelon Perla, PA-C Taking Active Self  COMBIGAN 0.2-0.5 % ophthalmic solution [id number] Yes Apply 1 drop to eye 2 (two) times daily. [provider] Taking Active Self  esomeprazole (NEXIUM) 20 MG capsule [id number] Yes Take 40 mg by mouth daily. [provider] Taking Active Self  finasteride (PROSCAR) 5 MG tablet [id number] No Take 1 tablet (5 mg total) by mouth daily.  Patient not taking: Reported on 08/01/2023   Malen Gauze, MD Not Taking Active Self  fluticasone-salmeterol (ADVAIR [facility]) 230-21 MCG/ACT inhaler [id number] Yes Inhale 2 puffs into the lungs 2 (two) times daily. Nyoka Cowden, MD Taking Active Self  guaiFENesin [facility]) 600  MG 12 hr tablet [id number] No Take 600 mg by mouth 2 (two) times daily as needed for cough.  Patient not taking: Reported on 08/01/2023   [provider] Not Taking Active Self           Med Note Sherrie Mustache, [place] A   Tue Jul 19, 2023  3:56  PM) Pt alternates plain mucinex with the mucinex d  HYDROcodone-acetaminophen (NORCO/VICODIN) 5-325 MG tablet [id number] Yes Take 1 tablet by mouth every 6 (six) hours as needed for moderate pain (pain score 4-6). Dara Lords, PA-C Taking Active   LORazepam (ATIVAN) 0.5 MG tablet [id number] Yes TAKE 1 TABLET BY MOUTH ONCE DAILY AT BEDTIME AS NEEDED Tommie Sams, DO Taking Active Self  lovastatin (MEVACOR) 40 MG tablet [id number] Yes Take 1 tablet (40 mg total) by mouth daily. Tommie Sams, DO Taking Active Self  metroNIDAZOLE (METROGEL) 0.75 % gel [id number] Yes Apply 1 Application topically daily as needed (rosacea). [provider] Taking Active Self  oxymetazoline (AFRIN) 0.05 % nasal spray [id number] Yes Place 1 spray into both nostrils 2 (two) times daily as needed for congestion. [provider] Taking Active Self           Med Note Sherrie Mustache, Ronaldo Miyamoto A   Tue Jul 19, 2023  3:56 PM)    pseudoephedrine-guaifenesin (MUCINEX D) 60-600 MG 12 hr tablet [id number] No Take 1 tablet by mouth 2 (two) times daily as needed for congestion.  Patient not taking: Reported on 08/01/2023   [provider] Not Taking Active Self           Med Note Sherrie Mustache, [place] A   Tue Jul 19, 2023  3:56 PM) Pt alternates plain mucinex with the mucinex d  Spacer/Aero-Holding Rudean Curt [id number] Yes Use as directed Tomma Lightning, MD Taking Active Self  triamcinolone cream (KENALOG) 0.1 % [id number] Yes Apply 1 Application topically 2 (two) times daily as needed (itching). [provider] Taking Active Self            Home Care and Equipment/Supplies: Were Home Health Services Ordered?: No Any new equipment or medical supplies ordered?: No  Functional Questionnaire: Do you need assistance with bathing/showering or dressing?: No Do you need assistance with meal preparation?: No Do you need assistance with eating?: No Do you have difficulty maintaining continence: No Do you need  assistance with getting out of bed/getting out of a chair/moving?: No Do you have difficulty managing or taking your medications?: No  Follow up appointments reviewed: PCP Follow-up appointment confirmed?: No (pt states he will be going to specialist appointments, will not be seeing primary care provider any time soon, declines for appointment to be made) MD Provider Line Number:[phone] Given: No Specialist Hospital Follow-up appointment confirmed?: No Reason Specialist Follow-Up Not Confirmed: Patient has Specialist Provider Number and will Call for Appointment (pt reports he has the number for [facility] to make appointment for 4 weeks, if does not hear from anyone this week, will call next week to schedule, pt to see Dr. [name] pulmonary on 3/31 @ 330 pm) Do you need transportation to your follow-up appointment?: No Do you understand care options if your condition(s) worsen?: Yes-patient verbalized understanding  SDOH Interventions Today    Flowsheet Row Most Recent Value  SDOH Interventions   Food Insecurity Interventions Intervention Not Indicated  Housing Interventions Intervention Not Indicated  Transportation Interventions Intervention Not Indicated  Utilities Interventions Intervention Not Indicated       Raynelle Fanning  Kieth Brightly, BSN RN Care Manager/ Transition of Care [place] [phone]

## 2023-08-02 LAB — POCT ACTIVATED CLOTTING TIME
Activated Clotting Time: 222 s
Activated Clotting Time: 239 s
Activated Clotting Time: 245 s

## 2023-08-03 ENCOUNTER — Telehealth: Payer: Self-pay

## 2023-08-03 ENCOUNTER — Other Ambulatory Visit: Payer: Self-pay

## 2023-08-03 DIAGNOSIS — I7143 Infrarenal abdominal aortic aneurysm, without rupture: Secondary | ICD-10-CM

## 2023-08-03 NOTE — Telephone Encounter (Signed)
 Pt lvm stating that he feels like he is having similar symptoms to pas prostate infection wants to know if he needs another abx states he just finished his last abx

## 2023-08-04 DIAGNOSIS — I2510 Atherosclerotic heart disease of native coronary artery without angina pectoris: Secondary | ICD-10-CM | POA: Diagnosis not present

## 2023-08-04 DIAGNOSIS — Z7951 Long term (current) use of inhaled steroids: Secondary | ICD-10-CM | POA: Diagnosis not present

## 2023-08-04 DIAGNOSIS — I10 Essential (primary) hypertension: Secondary | ICD-10-CM | POA: Diagnosis not present

## 2023-08-04 DIAGNOSIS — K5904 Chronic idiopathic constipation: Secondary | ICD-10-CM | POA: Diagnosis not present

## 2023-08-04 DIAGNOSIS — Z85820 Personal history of malignant melanoma of skin: Secondary | ICD-10-CM | POA: Diagnosis not present

## 2023-08-04 DIAGNOSIS — M47816 Spondylosis without myelopathy or radiculopathy, lumbar region: Secondary | ICD-10-CM | POA: Diagnosis not present

## 2023-08-04 DIAGNOSIS — D649 Anemia, unspecified: Secondary | ICD-10-CM | POA: Diagnosis not present

## 2023-08-04 DIAGNOSIS — Z7982 Long term (current) use of aspirin: Secondary | ICD-10-CM | POA: Diagnosis not present

## 2023-08-04 DIAGNOSIS — J4550 Severe persistent asthma, uncomplicated: Secondary | ICD-10-CM | POA: Diagnosis not present

## 2023-08-04 DIAGNOSIS — N401 Enlarged prostate with lower urinary tract symptoms: Secondary | ICD-10-CM | POA: Diagnosis not present

## 2023-08-04 DIAGNOSIS — I252 Old myocardial infarction: Secondary | ICD-10-CM | POA: Diagnosis not present

## 2023-08-04 DIAGNOSIS — Z87891 Personal history of nicotine dependence: Secondary | ICD-10-CM | POA: Diagnosis not present

## 2023-08-04 DIAGNOSIS — M170 Bilateral primary osteoarthritis of knee: Secondary | ICD-10-CM | POA: Diagnosis not present

## 2023-08-04 DIAGNOSIS — Z48812 Encounter for surgical aftercare following surgery on the circulatory system: Secondary | ICD-10-CM | POA: Diagnosis not present

## 2023-08-04 DIAGNOSIS — E7800 Pure hypercholesterolemia, unspecified: Secondary | ICD-10-CM | POA: Diagnosis not present

## 2023-08-04 DIAGNOSIS — R351 Nocturia: Secondary | ICD-10-CM | POA: Diagnosis not present

## 2023-08-04 DIAGNOSIS — J4489 Other specified chronic obstructive pulmonary disease: Secondary | ICD-10-CM | POA: Diagnosis not present

## 2023-08-04 NOTE — Progress Notes (Signed)
 Name: [name] [name] DOB: [birth year] MRN: [id number]  History of Present Illness: [name] [name] is a 83 y.o. male who presents today for follow up visit at [facility] [place].  - GU history: 1. BPH with LUTS (urgency, frequency, nocturia x2-3). - Taking Uroxatral 10 mg nightly and Proscar (Finasteride) 5 mg daily. - Allergic to Myrbetriq (rash). - PSAs were normal in the past.  Recent history: > 06/27/2023: Seen by [name] [name].  > At some point between Urology visits patient reports that his PCP ([name] [name]) wrote an antibiotic, however unable to find record of that in EMR today.  > 07/08/2023: Seen by [name] [name]. - Follow up after ER visit for gross hematuria. CT negative. UA shows blood. No culture was sent. Being treated with Vantin for prostatitis.  - The plan was:  1. Continue Vantin 200 mg BID for 21 days. 2. Start Proscar (Finasteride) 5 mg daily.  Since last visit: > 07/29/2023:  - Underwent endovascular repair of 5.7 cm infrarenal abdominal aortic aneurysm by [name] [name].  - Patient reports that he stopped Vantin preop (on 07/17/2023).  Today: He reports that he had a catheter during his recent surgery; had dysuria for a few hours after it was removed on 07/30/2023. Starting on Monday 08/01/2023 he developed chills so he restarted the Vantin. Around midday today he started having dysuria along with increased urinary urgency, frequency, nocturia, and has persistent chills. Denies pelvic pain or changes in bowel function.   Medications: Current Outpatient Medications  Medication Sig Dispense Refill   alfuzosin (UROXATRAL) 10 MG 24 hr tablet Take 1 tablet (10 mg total) by mouth daily with breakfast. 30 tablet 0   finasteride (PROSCAR) 5 MG tablet Take 1 tablet (5 mg total) by mouth daily. 90 tablet 3   albuterol (PROVENTIL) (2.5 MG/3ML) 0.083% nebulizer solution Take 3 mLs (2.5 mg total) by nebulization every 6 (six) hours as needed for wheezing or shortness of breath. 360  mL 5   albuterol (VENTOLIN HFA) 108 (90 Base) MCG/ACT inhaler Inhale 2 puffs into the lungs every 4 (four) hours as needed for wheezing or shortness of breath. 18 each 0   aspirin EC 81 MG tablet Take 1 tablet (81 mg total) by mouth daily at 6 (six) AM. Swallow whole. 30 tablet 12   cefpodoxime (VANTIN) 100 MG tablet Take 1 tablet (100 mg total) by mouth 2 (two) times daily. 56 tablet 0   cetirizine (ZYRTEC) 10 MG tablet Take 10 mg by mouth daily.     Chlorphen-Phenyleph-ASA (ALKA-SELTZER PLUS COLD PO) Take 2 tablets by mouth daily as needed (congestion). (Patient not taking: Reported on 08/01/2023)     clotrimazole (LOTRIMIN) 1 % cream Apply to affected area 2 times daily (Patient taking differently: Apply 1 Application topically 2 (two) times daily as needed (itching).) 15 g 0   COMBIGAN 0.2-0.5 % ophthalmic solution Apply 1 drop to eye 2 (two) times daily.     esomeprazole (NEXIUM) 20 MG capsule Take 40 mg by mouth daily.     fluticasone-salmeterol (ADVAIR HFA) 230-21 MCG/ACT inhaler Inhale 2 puffs into the lungs 2 (two) times daily.     guaiFENesin (MUCINEX) 600 MG 12 hr tablet Take 600 mg by mouth 2 (two) times daily as needed for cough. (Patient not taking: Reported on 08/01/2023)     HYDROcodone-acetaminophen (NORCO/VICODIN) 5-325 MG tablet Take 1 tablet by mouth every 6 (six) hours as needed for moderate pain (pain score 4-6). 8 tablet 0  LORazepam (ATIVAN) 0.5 MG tablet TAKE 1 TABLET BY MOUTH ONCE DAILY AT BEDTIME AS NEEDED 30 tablet 3   lovastatin (MEVACOR) 40 MG tablet Take 1 tablet (40 mg total) by mouth daily. 90 tablet 0   metroNIDAZOLE (METROGEL) 0.75 % gel Apply 1 Application topically daily as needed (rosacea).     oxymetazoline (AFRIN) 0.05 % nasal spray Place 1 spray into both nostrils 2 (two) times daily as needed for congestion.     pseudoephedrine-guaifenesin (MUCINEX D) 60-600 MG 12 hr tablet Take 1 tablet by mouth 2 (two) times daily as needed for congestion. (Patient not  taking: Reported on 08/01/2023)     Spacer/Aero-Holding Rudean Curt Use as directed 1 each 0   triamcinolone cream (KENALOG) 0.1 % Apply 1 Application topically 2 (two) times daily as needed (itching).     No current facility-administered medications for this visit.    Allergies: Allergies  Allergen Reactions   Bactrim [Sulfamethoxazole-Trimethoprim] Hives   Beta Adrenergic Blockers Diarrhea and Other (See Comments)   Lasix [Furosemide] Rash   Doxycycline Swelling    Lips swelling and skin peeling around mouth   Myrbetriq [Mirabegron] Hives and Itching   Ciprofloxacin Nausea And Vomiting, Rash and Other (See Comments)    Body aches    Dexamethasone Swelling   Gabapentin Swelling   Methocarbamol Swelling and Rash   Neomycin Swelling and Rash   Penicillins Swelling and Rash        Tetracyclines & Related Itching    Past Medical History:  Diagnosis Date   AAA (abdominal aortic aneurysm) (HCC)    needs yearly ultrasound   Allergy    Anemia    Arthritis    Asthma    BCC (basal cell carcinoma) 08/18/1989   left shoulder blad, upper right arm, left arm beyond elbow, c&d   BCC (basal cell carcinoma) 01/31/1992   Posterior neck, curetx3, 38fu   BCC (basal cell carcinoma) 11/22/2001   mid forehead, cx3, excision, right forearm, cx3, 81fu   BCC (basal cell carcinoma) 10/09/2003   mid forehead, MOHs   BCC (basal cell carcinoma) 08/15/2008   upper left back, biopsy   BPH (benign prostatic hyperplasia)    CAD (coronary artery disease)    Cancer (HCC)    skin cancer   COPD (chronic obstructive pulmonary disease) (HCC)    Dysrhythmia    pt. states it can be fast at times   GERD (gastroesophageal reflux disease)    Glaucoma    History of acute pancreatitis 12/21/2022   HOH (hard of hearing)    Hypercholesterolemia    Hypertension    Impaired fasting glucose    Low back pain    Melanoma in situ (HCC) 10/09/2003   left chin, MOHs   MI (myocardial infarction) (HCC) 1999    Peripheral vascular disease (HCC)    AAA   SCC (squamous cell carcinoma) 07/03/2014   in situ, behind left ear, cx3, cautery, 82fu   SCC (squamous cell carcinoma) 07/03/2014   well diff, left forearm, biopsy, cx1, cautery   SCC (squamous cell carcinoma) 07/20/2017   in situ, left upper arm, cx3, 17fu   SCC (squamous cell carcinoma) 01/10/2019   in situ, left post shoulder, cx3, 14fu   SCC (squamous cell carcinoma) 11/22/2001   left forearm distal, left forearm, cx3, 23fu   SCC (squamous cell carcinoma) 10/09/2003   [place], left ear post, clear per st, right cheek clear   SCC (squamous cell carcinoma) 03/30/2004   in situ, left  upper arm, cx3, 47fu   SCC (squamous cell carcinoma) 03/08/2005   in situ, right cheek, mid upper forehead, cx3, 35fu   SCC (squamous cell carcinoma) 06/08/2006   in situ, left shoulder, cx3, 68fu   SCC (squamous cell carcinoma) 05/05/2010   right inner wrist, biopsy   SCC (squamous cell carcinoma) 09/13/2013   in situ, right crown scalp, front scalp, biopsy   Thrush    Past Surgical History:  Procedure Laterality Date   ABDOMINAL AORTIC ENDOVASCULAR STENT GRAFT N/A 07/29/2023   Procedure: INSERTION, ENDOVASCULAR STENT GRAFT, AORTA, ABDOMINAL;  Surgeon: Cephus Shelling, MD;  Location: [facility];  Service: Vascular;  Laterality: N/A;   BACK SURGERY     x 3   CARDIAC CATHETERIZATION     angioplasty   CATARACT EXTRACTION W/PHACO  03/20/2012   Procedure: CATARACT EXTRACTION PHACO AND INTRAOCULAR LENS PLACEMENT (IOC);  Surgeon: Susa Simmonds, MD;  Location: [facility];  Service: Ophthalmology;  Laterality: Right;  CDE:  8.45   CATARACT EXTRACTION W/PHACO Left 04/02/2013   Procedure: CATARACT EXTRACTION PHACO AND INTRAOCULAR LENS PLACEMENT (IOC);  Surgeon: Susa Simmonds, MD;  Location: [facility];  Service: Ophthalmology;  Laterality: Left;  CDE:  6.50   CHOLECYSTECTOMY  2000   COLONOSCOPY  2009   repeat 5 years   ESOPHAGOGASTRODUODENOSCOPY     HERNIA REPAIR Left     inguinal   INGUINAL HERNIA REPAIR Right 03/28/2020   Procedure: Right Inguinal Herniorrhaphy with Mesh;  Surgeon: Franky Macho, MD;  Location: [facility];  Service: General;  Laterality: Right;   LAPAROSCOPIC PARTIAL COLECTOMY N/A 06/11/2013   Procedure: LAPAROSCOPIC HAND ASSISTED PARTIAL COLECTOMY;  Surgeon: Dalia Heading, MD;  Location: [facility];  Service: General;  Laterality: N/A;   NASAL ENDOSCOPY WITH EPISTAXIS CONTROL Bilateral 02/11/2020   Procedure: NASAL ENDOSCOPY WITH EPISTAXIS CONTROL;  Surgeon: Newman Pies, MD;  Location: [facility];  Service: ENT;  Laterality: Bilateral;   REPAIR ILIAC ARTERY Right 07/29/2023   Procedure: REPAIR, ARTERY, FEMORAL;  Surgeon: Cephus Shelling, MD;  Location: [facility];  Service: Vascular;  Laterality: Right;   right eye detached retina Bilateral    SPINAL FUSION  2016   ULTRASOUND GUIDANCE FOR VASCULAR ACCESS Bilateral 07/29/2023   Procedure: ULTRASOUND GUIDANCE, FOR VASCULAR ACCESS;  Surgeon: Cephus Shelling, MD;  Location: [facility];  Service: Vascular;  Laterality: Bilateral;   YAG LASER APPLICATION Left 05/06/2014   Procedure: YAG LASER APPLICATION;  Surgeon: Susa Simmonds, MD;  Location: [facility];  Service: Ophthalmology;  Laterality: Left;   Family History  Problem Relation Age of Onset   Hypertension Mother    COPD Father    Cancer Brother        brain   Social History   Socioeconomic History   Marital status: Widowed    Spouse name: Not on file   Number of children: Not on file   Years of education: Not on file   Highest education level: Not on file  Occupational History   Not on file  Tobacco Use   Smoking status: Former    Current packs/day: 0.00    Average packs/day: 1 pack/day for 35.0 years (35.0 ttl pk-yrs)    Types: Cigarettes    Start date: 03/27/1968    Quit date: 03/28/2003    Years since quitting: 20.3   Smokeless tobacco: Never  Vaping Use   Vaping status: Never Used  Substance and Sexual Activity    Alcohol use: No  Alcohol/week: 0.0 standard drinks of alcohol   Drug use: No   Sexual activity: Not Currently    Birth control/protection: None  Other Topics Concern   Not on file  Social History Narrative   Not on file   Social Drivers of Health   Financial Resource Strain: Low Risk  (03/18/2022)   Overall Financial Resource Strain (CARDIA)    Difficulty of Paying Living Expenses: Not very hard  Food Insecurity: No Food Insecurity (08/01/2023)   Hunger Vital Sign    Worried About Running Out of Food in the Last Year: Never true    Ran Out of Food in the Last Year: Never true  Transportation Needs: No Transportation Needs (08/01/2023)   PRAPARE - Administrator, Civil Service (Medical): No    Lack of Transportation (Non-Medical): No  Physical Activity: Insufficiently Active (03/18/2022)   Exercise Vital Sign    Days of Exercise per Week: 2 days    Minutes of Exercise per Session: 20 min  Stress: No Stress Concern Present (03/18/2022)   [employer] of Occupational Health - Occupational Stress Questionnaire    Feeling of Stress : Not at all  Social Connections: Socially Isolated (07/30/2023)   Social Connection and Isolation Panel [NHANES]    Frequency of Communication with Friends and Family: More than three times a week    Frequency of Social Gatherings with Friends and Family: Three times a week    Attends Religious Services: Never    Active Member of Clubs or Organizations: No    Attends Banker Meetings: Never    Marital Status: Widowed  Intimate Partner Violence: Not At Risk (08/01/2023)   Humiliation, Afraid, Rape, and Kick questionnaire    Fear of Current or Ex-Partner: No    Emotionally Abused: No    Physically Abused: No    Sexually Abused: No    Review of Systems Constitutional: Patient denies any unintentional weight loss or change in strength lntegumentary: Patient denies any rashes or pruritus Cardiovascular: Patient denies chest  pain or syncope Respiratory: Patient denies shortness of breath Gastrointestinal: As per HPI Musculoskeletal: Patient denies muscle cramps or weakness Neurologic: Patient denies convulsions or seizures Allergic/Immunologic: Patient denies recent allergic reaction(s) Hematologic/Lymphatic: Patient denies bleeding tendencies Endocrine: Patient denies heat/cold intolerance  GU: As per HPI.  OBJECTIVE Vitals:   08/05/23 1339  BP: 129/74  Pulse: 87  Temp: (!) 97.5 F (36.4 C)   There is no height or weight on file to calculate BMI.  Physical Examination Constitutional: Patient is non-toxic appearing but appears mildly uncomfortable Cardiovascular: No visible lower extremity edema.  Respiratory: The patient does not have audible wheezing/stridor; respirations do not appear labored  Gastrointestinal: Abdomen non-distended Musculoskeletal: Normal ROM of UEs  Skin: No obvious rashes/open sores; mild pallor Neurologic: CN 2-12 grossly intact Psychiatric: Answered questions appropriately with normal affect  Hematologic/Lymphatic/Immunologic: No obvious bruises or sites of spontaneous bleeding  GU: Patient declined DRE  UA: negative  PVR: 32 ml  ASSESSMENT BPH associated with nocturia - Plan: BLADDER SCAN AMB NON-IMAGING, Urinalysis, Routine w reflex microscopic  Bacterial prostatitis - Plan: cefpodoxime (VANTIN) 100 MG tablet, MR Pelvis W Wo Contrast  We agreed to treat for suspected recurrent bacterial prostatitis. Antibiotic selection limited due to his multiple allergies including Doxycycline and sulfa drugs; Vantin refilled. Ordered MRI pelvis for further evaluation since he's been having this concern since November. We discussed recommendation to continue Uroxatrol (Alfuzosin) 10 mg daily and to start Proscar (Finasteride) 5  mg daily as previously prescribed by [name] [name]; rationale discussed. Will consult [name] [name] for any further recommendations. Will plan for follow up as  previously scheduled with [name] [name] on 08/24/2023 or sooner if needed. Pt verbalized understanding and agreement. All questions were answered.  PLAN Advised the following: 1. Cefpodoxime (Vantin) 100 mg twice per day for 14 days.  2. Start Proscar (Finasteride) 5 mg daily. 3. Continue Uroxatrol (Alfuzosin) 10 mg daily. 4. Pelvic MRI.  5. Return in 19 days (on 08/24/2023) for f/u with [name] [name], as previously scheduled.  Orders Placed This Encounter  Procedures   MR Pelvis W Wo Contrast    Prefers open MRI if available    Standing Status:   Future    Expiration Date:   08/04/2024    If indicated for the ordered procedure, I authorize the administration of contrast media per Radiology protocol:   Yes    What is the patient's sedation requirement?:   No Sedation    Does the patient have a pacemaker or implanted devices?:   No    Preferred imaging location?:   [facility] (table limit - 550lbs)   Urinalysis, Routine w reflex microscopic   BLADDER SCAN AMB NON-IMAGING   Total time spent caring for the patient today was over 30 minutes. This includes time spent on the date of the visit reviewing the patient's chart before the visit, time spent during the visit, and time spent after the visit on documentation. Over 50% of that time was spent in face-to-face time with this patient for direct counseling. E&M based on time and complexity of medical decision making.  It has been explained that the patient is to follow regularly with their PCP in addition to all other providers involved in their care and to follow instructions provided by these respective offices. Patient advised to contact urology clinic if any urologic-pertaining questions, concerns, new symptoms or problems arise in the interim period.  Patient Instructions  Plan: 1. Restart antibiotic Cefpodoxime (Vantin) 200 mg twice daily for 14 days. 2. Start Proscar (Finasteride) 5 mg daily (prescribed by [name] [name] on  07/08/2023).  3. Continue Uroxatrol (Alfuzosin) 10 mg daily. 4. Proceed with pelvic MRI.  5. Follow up with [name] [name].    Electronically signed by:  Donnita Falls, FNP   08/07/23    10:36 AM

## 2023-08-05 ENCOUNTER — Ambulatory Visit: Admitting: Urology

## 2023-08-05 ENCOUNTER — Encounter: Payer: Self-pay | Admitting: Urology

## 2023-08-05 VITALS — BP 129/74 | HR 87 | Temp 97.5°F

## 2023-08-05 DIAGNOSIS — B9689 Other specified bacterial agents as the cause of diseases classified elsewhere: Secondary | ICD-10-CM

## 2023-08-05 DIAGNOSIS — N401 Enlarged prostate with lower urinary tract symptoms: Secondary | ICD-10-CM | POA: Diagnosis not present

## 2023-08-05 DIAGNOSIS — R351 Nocturia: Secondary | ICD-10-CM | POA: Diagnosis not present

## 2023-08-05 LAB — URINALYSIS, ROUTINE W REFLEX MICROSCOPIC
Bilirubin, UA: NEGATIVE
Glucose, UA: NEGATIVE
Ketones, UA: NEGATIVE
Leukocytes,UA: NEGATIVE
Nitrite, UA: NEGATIVE
Protein,UA: NEGATIVE
RBC, UA: NEGATIVE
Specific Gravity, UA: 1.02 (ref 1.005–1.030)
Urobilinogen, Ur: 0.2 mg/dL (ref 0.2–1.0)
pH, UA: 7.5 (ref 5.0–7.5)

## 2023-08-05 LAB — BLADDER SCAN AMB NON-IMAGING: Scan Result: 32

## 2023-08-05 MED ORDER — CEFPODOXIME PROXETIL 100 MG PO TABS: 100.0000 mg | ORAL_TABLET | Freq: Two times a day (BID) | ORAL | 0 refills | Status: DC

## 2023-08-05 NOTE — Patient Instructions (Addendum)
 Plan: 1. Restart antibiotic Cefpodoxime (Vantin) 200 mg twice daily for 14 days. 2. Start Proscar (Finasteride) 5 mg daily (prescribed by Dr. Ronne Binning on 07/08/2023).  3. Continue Uroxatrol (Alfuzosin) 10 mg daily. 4. Proceed with pelvic MRI.  5. Follow up with Dr. Ronne Binning.

## 2023-08-08 ENCOUNTER — Telehealth: Payer: Self-pay

## 2023-08-08 DIAGNOSIS — Z48812 Encounter for surgical aftercare following surgery on the circulatory system: Secondary | ICD-10-CM | POA: Diagnosis not present

## 2023-08-08 DIAGNOSIS — I10 Essential (primary) hypertension: Secondary | ICD-10-CM | POA: Diagnosis not present

## 2023-08-08 DIAGNOSIS — E7800 Pure hypercholesterolemia, unspecified: Secondary | ICD-10-CM | POA: Diagnosis not present

## 2023-08-08 DIAGNOSIS — I252 Old myocardial infarction: Secondary | ICD-10-CM | POA: Diagnosis not present

## 2023-08-08 DIAGNOSIS — I2510 Atherosclerotic heart disease of native coronary artery without angina pectoris: Secondary | ICD-10-CM | POA: Diagnosis not present

## 2023-08-08 DIAGNOSIS — J4489 Other specified chronic obstructive pulmonary disease: Secondary | ICD-10-CM | POA: Diagnosis not present

## 2023-08-08 NOTE — Telephone Encounter (Signed)
 Returned pt's call regarding his post op questions. All questions answered. Pt is doing well and aware of his f/u appts.

## 2023-08-10 ENCOUNTER — Telehealth: Payer: Self-pay

## 2023-08-10 DIAGNOSIS — E7800 Pure hypercholesterolemia, unspecified: Secondary | ICD-10-CM | POA: Diagnosis not present

## 2023-08-10 DIAGNOSIS — I10 Essential (primary) hypertension: Secondary | ICD-10-CM | POA: Diagnosis not present

## 2023-08-10 DIAGNOSIS — Z48812 Encounter for surgical aftercare following surgery on the circulatory system: Secondary | ICD-10-CM | POA: Diagnosis not present

## 2023-08-10 DIAGNOSIS — I2510 Atherosclerotic heart disease of native coronary artery without angina pectoris: Secondary | ICD-10-CM | POA: Diagnosis not present

## 2023-08-10 DIAGNOSIS — J4489 Other specified chronic obstructive pulmonary disease: Secondary | ICD-10-CM | POA: Diagnosis not present

## 2023-08-10 DIAGNOSIS — I252 Old myocardial infarction: Secondary | ICD-10-CM | POA: Diagnosis not present

## 2023-08-10 NOTE — Telephone Encounter (Signed)
 Called Pt to relay message from NP as well as sent a mychart message. Per Pt  he had an abdominal aorta aneurysm and stent placement a week ago and wants to get clearance from cardio MD its okay to do an MRI Pt advise that I would notify MD and NP just as a FYI Pt states he will keep his f/u appt.

## 2023-08-10 NOTE — Telephone Encounter (Signed)
 Pt states that since he started taking finasteride he started having a burning sensation when he urinates once he stopped taking the finasteride the burning stopped Pt also states he was prescribed finasteride and Vantin 02/21 but never started taking his finasteride, once he completed his first cycle of Vantin he was prescribed it again for prostatitis on 03/21 and advised by NP to also take his his finasteride

## 2023-08-12 DIAGNOSIS — E7800 Pure hypercholesterolemia, unspecified: Secondary | ICD-10-CM | POA: Diagnosis not present

## 2023-08-12 DIAGNOSIS — J4489 Other specified chronic obstructive pulmonary disease: Secondary | ICD-10-CM | POA: Diagnosis not present

## 2023-08-12 DIAGNOSIS — Z48812 Encounter for surgical aftercare following surgery on the circulatory system: Secondary | ICD-10-CM | POA: Diagnosis not present

## 2023-08-12 DIAGNOSIS — I10 Essential (primary) hypertension: Secondary | ICD-10-CM | POA: Diagnosis not present

## 2023-08-12 DIAGNOSIS — I2510 Atherosclerotic heart disease of native coronary artery without angina pectoris: Secondary | ICD-10-CM | POA: Diagnosis not present

## 2023-08-12 DIAGNOSIS — I252 Old myocardial infarction: Secondary | ICD-10-CM | POA: Diagnosis not present

## 2023-08-12 NOTE — Progress Notes (Unsigned)
 [name] [name], male    DOB: 1940/10/17    MRN: [id number]   Brief patient profile:  72  yowm  quit smoking 2004  with hx  bad asthma as child former [place] pt self-referred back to pulmonary clinic in [facility]  05/05/2023  for copd  with GOLD 3 (barely) criteria but nl DLCO   11/05/19      History of Present Illness  05/05/2023  Pulmonary/ 1st office eval/ [name] / [name] [facility]  advair 240 Chief Complaint  Patient presents with   Establish Care    Switch from [facility]   Shortness of Breath  Dyspnea:  pushing mower x / walks a bit slower than others Cough: dark mucus x one day /also hoarse  Sleep: bed is flat and 2 pillows s resp cc  SABA hfa use: 2-3 x per week but neb twice daily as maint  02 WGN:FAOZ  Rec If mucus stays nasty > Take Zpak (refillable)  Stop Advair  Plan A = Automatic = Always=    Breyna 160  (or breztri x one) x 2 puffs 1st thing in am and 12 hours later  Work on inhaler technique:  >>>  Remember how golfers warm up by taking practice swings - do this with an empty inhaler  Plan B = Backup (to supplement plan A, not to replace it) Only use your albuterol inhaler as a rescue medication  Plan C = Crisis (instead of Plan B but only if Plan B stops working) - only use your albuterol nebulizer if you first try Plan B    08/15/2023  3 m f/u ov/[name] office/[name] re: copd / AB  maint on advair   No chief complaint on file.  Dyspnea:  improving since surgery  Cough: congestion esp in am  Sleeping: does fine flat 2 pillows s    resp cc  SABA use: still on neb twice daily     No obvious day to day or daytime variability or assoc excess/ purulent sputum or mucus plugs or hemoptysis or cp or chest tightness, subjective wheeze or overt sinus or hb symptoms.    Also denies any obvious fluctuation of symptoms with weather or environmental changes or other aggravating or alleviating factors except as outlined above   No unusual exposure hx or h/o  childhood pna/ asthma or knowledge of premature birth.  Current Allergies, Complete Past Medical History, Past Surgical History, Family History, and Social History were reviewed in [employer] record.  ROS  The following are not active complaints unless bolded Hoarseness, sore throat, dysphagia, dental problems, itching, sneezing,  nasal congestion or discharge of excess mucus or purulent secretions, ear ache,   fever, chills, sweats, unintended wt loss or wt gain, classically pleuritic or exertional cp,  orthopnea pnd or arm/hand swelling  or leg swelling, presyncope, palpitations, abdominal pain, anorexia, nausea, vomiting, diarrhea  or change in bowel habits or change in bladder habits, change in stools or change in urine, dysuria, hematuria,  rash, arthralgias, visual complaints, headache, numbness, weakness or ataxia or problems with walking or coordination,  change in mood or  memory.        Current Meds  Medication Sig   albuterol (PROVENTIL) (2.5 MG/3ML) 0.083% nebulizer solution Take 3 mLs (2.5 mg total) by nebulization every 6 (six) hours as needed for wheezing or shortness of breath.   albuterol (VENTOLIN HFA) 108 (90 Base) MCG/ACT inhaler Inhale 2 puffs into the lungs every 4 (four) hours  as needed for wheezing or shortness of breath.   alfuzosin (UROXATRAL) 10 MG 24 hr tablet Take 1 tablet (10 mg total) by mouth daily with breakfast.   aspirin EC 81 MG tablet Take 1 tablet (81 mg total) by mouth daily at 6 (six) AM. Swallow whole.   cefpodoxime (VANTIN) 100 MG tablet Take 1 tablet (100 mg total) by mouth 2 (two) times daily.   cetirizine (ZYRTEC) 10 MG tablet Take 10 mg by mouth daily.   clotrimazole (LOTRIMIN) 1 % cream Apply to affected area 2 times daily (Patient taking differently: Apply 1 Application topically 2 (two) times daily as needed (itching).)   COMBIGAN 0.2-0.5 % ophthalmic solution Apply 1 drop to eye 2 (two) times daily.   esomeprazole (NEXIUM) 20  MG capsule Take 40 mg by mouth daily.   finasteride (PROSCAR) 5 MG tablet Take 1 tablet (5 mg total) by mouth daily.   fluticasone-salmeterol (ADVAIR HFA) 230-21 MCG/ACT inhaler Inhale 2 puffs into the lungs 2 (two) times daily.   guaiFENesin (MUCINEX) 600 MG 12 hr tablet Take 600 mg by mouth 2 (two) times daily as needed for cough.   HYDROcodone-acetaminophen (NORCO/VICODIN) 5-325 MG tablet Take 1 tablet by mouth every 6 (six) hours as needed for moderate pain (pain score 4-6).   LORazepam (ATIVAN) 0.5 MG tablet TAKE 1 TABLET BY MOUTH ONCE DAILY AT BEDTIME AS NEEDED   lovastatin (MEVACOR) 40 MG tablet Take 1 tablet (40 mg total) by mouth daily.   metroNIDAZOLE (METROGEL) 0.75 % gel Apply 1 Application topically daily as needed (rosacea).   oxymetazoline (AFRIN) 0.05 % nasal spray Place 1 spray into both nostrils 2 (two) times daily as needed for congestion.   pseudoephedrine-guaifenesin (MUCINEX D) 60-600 MG 12 hr tablet Take 1 tablet by mouth 2 (two) times daily as needed for congestion.   Spacer/Aero-Holding Rudean Curt Use as directed   triamcinolone cream (KENALOG) 0.1 % Apply 1 Application topically 2 (two) times daily as needed (itching).          Past Medical History:  Diagnosis Date   AAA (abdominal aortic aneurysm) (HCC)    needs yearly ultrasound   Allergy    Anemia    Arthritis    Asthma    BCC (basal cell carcinoma) 08/18/1989   left shoulder blad, upper right arm, left arm beyond elbow, c&d   BCC (basal cell carcinoma) 01/31/1992   Posterior neck, curetx3, 23fu   BCC (basal cell carcinoma) 11/22/2001   mid forehead, cx3, excision, right forearm, cx3, 17fu   BCC (basal cell carcinoma) 10/09/2003   mid forehead, MOHs   BCC (basal cell carcinoma) 08/15/2008   upper left back, biopsy   BPH (benign prostatic hyperplasia)    CAD (coronary artery disease)    Cancer (HCC)    skin cancer   COPD (chronic obstructive pulmonary disease) (HCC)    Dysrhythmia    pt. states it  can be fast at times   GERD (gastroesophageal reflux disease)    Glaucoma    History of acute pancreatitis 12/21/2022   HOH (hard of hearing)    Hypercholesterolemia    Hypertension    Impaired fasting glucose    Low back pain    Melanoma in situ (HCC) 10/09/2003   left chin, MOHs   MI (myocardial infarction) (HCC) 1999   SCC (squamous cell carcinoma) 07/03/2014   in situ, behind left ear, cx3, cautery, 70fu   SCC (squamous cell carcinoma) 07/03/2014   well diff, left forearm,  biopsy, cx1, cautery   SCC (squamous cell carcinoma) 07/20/2017   in situ, left upper arm, cx3, 30fu   SCC (squamous cell carcinoma) 01/10/2019   in situ, left post shoulder, cx3, 29fu   SCC (squamous cell carcinoma) 11/22/2001   left forearm distal, left forearm, cx3, 84fu   SCC (squamous cell carcinoma) 10/09/2003   [place], left ear post, clear per st, right cheek clear   SCC (squamous cell carcinoma) 03/30/2004   in situ, left upper arm, cx3, 39fu   SCC (squamous cell carcinoma) 03/08/2005   in situ, right cheek, mid upper forehead, cx3, 7fu   SCC (squamous cell carcinoma) 06/08/2006   in situ, left shoulder, cx3, 57fu   SCC (squamous cell carcinoma) 05/05/2010   right inner wrist, biopsy   SCC (squamous cell carcinoma) 09/13/2013   in situ, right crown scalp, front scalp, biopsy   Thrush       Objective:    Wts  08/15/2023        ***   07/29/23 205 lb (93 kg)  07/22/23 207 lb 8 oz (94.1 kg)  07/21/23 210 lb 3.2 oz (95.3 kg)      Vital signs reviewed  08/15/2023  - Note at rest 02 sats  ***% on ***   General appearance:    ***      Min bar***        Assessment

## 2023-08-15 ENCOUNTER — Ambulatory Visit (INDEPENDENT_AMBULATORY_CARE_PROVIDER_SITE_OTHER): Payer: Medicare Other | Admitting: Internal Medicine

## 2023-08-15 ENCOUNTER — Encounter: Payer: Self-pay | Admitting: Internal Medicine

## 2023-08-15 VITALS — BP 132/77 | HR 83 | Ht 73.0 in | Wt 207.0 lb

## 2023-08-15 DIAGNOSIS — M5412 Radiculopathy, cervical region: Secondary | ICD-10-CM | POA: Insufficient documentation

## 2023-08-15 DIAGNOSIS — J4550 Severe persistent asthma, uncomplicated: Secondary | ICD-10-CM | POA: Diagnosis not present

## 2023-08-15 MED ORDER — FORMOTEROL FUMARATE 20 MCG/2ML IN NEBU: 20.0000 ug | INHALATION_SOLUTION | Freq: Two times a day (BID) | RESPIRATORY_TRACT | 11 refills | Status: DC

## 2023-08-15 MED ORDER — BUDESONIDE 0.25 MG/2ML IN SUSP: RESPIRATORY_TRACT | 12 refills | Status: DC

## 2023-08-15 NOTE — Patient Instructions (Addendum)
 Plan A = Automatic = Always=    new nebulizer solution first thing in am and 12 hours later   = formoterol and budesonide combined together is same nebulizer cup  Plan B = Backup (to supplement plan A, not to replace it) Only use your albuterol inhaler as a rescue medication to be used if you can't catch your breath by resting or doing a relaxed purse lip breathing pattern.  - The less you use it, the better it will work when you need it. - Ok to use the inhaler up to 2 puffs  every 4 hours if you must but call for appointment if use goes up over your usual need - Don't leave home without it !!  (think of it like the spare tire for your car)   Plan C = Crisis (instead of Plan B but only if Plan B stops working) - only use your albuterol nebulizer if you first try Plan B and it fails to help > ok to use the nebulizer up to every 4 hours but if start needing it regularly call for immediate appointment  Please schedule a follow up office visit in 6 weeks, call sooner if needed with all medications /inhalers/ solutions in hand so we can verify exactly what you are taking. This includes all medications from all doctors and over the counters

## 2023-08-16 NOTE — Assessment & Plan Note (Addendum)
 Quit smoking 2024 -  asthma onset as child  - PFT's  11/05/19  FEV1 1.72 (47 % ) ratio 0.61  p 8 % improvement from saba p saba  prior to study with DLCO  32.71 (114 %)   and FV curve classically concave    - 05/05/2023  After extensive coaching inhaler device,  effectiveness =    75% vs baseline 25 % try d/c advair (hoaresness) and breyna 160 2bid (trained on breztri to use one bid sample but could not tol) - 08/15/2023 changed to neb formoterol and bud 0.25 bid  His nl dlco supports dx of severe chronic asthma over copd but I'm sure there is some overlap here and should be doing better on hfa advair or breztri but can't wean him off his bid nebulizer so will try to meet his needs best with bid neb drug that actually works 12 h   Discussed in detail all the  indications, usual  risks and alternatives  relative to the benefits with patient who agrees to proceed with Rx as outlined.     F/u in 6 weeks, call sooner if needed      Each maintenance medication was reviewed in detail including emphasizing most importantly the difference between maintenance and prns and under what circumstances the prns are to be triggered using an action plan format where appropriate.  Total time for H and P, chart review, counseling, reviewing neb/hfa device(s) and generating customized AVS unique to this office visit / same day charting = 32 min for refractory respiratory  symptoms of uncertain etiology

## 2023-08-18 DIAGNOSIS — I2510 Atherosclerotic heart disease of native coronary artery without angina pectoris: Secondary | ICD-10-CM | POA: Diagnosis not present

## 2023-08-18 DIAGNOSIS — Z48812 Encounter for surgical aftercare following surgery on the circulatory system: Secondary | ICD-10-CM | POA: Diagnosis not present

## 2023-08-18 DIAGNOSIS — I10 Essential (primary) hypertension: Secondary | ICD-10-CM | POA: Diagnosis not present

## 2023-08-18 DIAGNOSIS — E7800 Pure hypercholesterolemia, unspecified: Secondary | ICD-10-CM | POA: Diagnosis not present

## 2023-08-18 DIAGNOSIS — I252 Old myocardial infarction: Secondary | ICD-10-CM | POA: Diagnosis not present

## 2023-08-18 DIAGNOSIS — J4489 Other specified chronic obstructive pulmonary disease: Secondary | ICD-10-CM | POA: Diagnosis not present

## 2023-08-24 ENCOUNTER — Ambulatory Visit: Payer: Medicare Other | Admitting: Urology

## 2023-08-24 VITALS — BP 130/73 | HR 69

## 2023-08-24 DIAGNOSIS — R351 Nocturia: Secondary | ICD-10-CM

## 2023-08-24 DIAGNOSIS — N401 Enlarged prostate with lower urinary tract symptoms: Secondary | ICD-10-CM | POA: Diagnosis not present

## 2023-08-24 DIAGNOSIS — Z87438 Personal history of other diseases of male genital organs: Secondary | ICD-10-CM | POA: Diagnosis not present

## 2023-08-24 DIAGNOSIS — N411 Chronic prostatitis: Secondary | ICD-10-CM

## 2023-08-24 MED ORDER — ALFUZOSIN HCL ER 10 MG PO TB24: 10.0000 mg | ORAL_TABLET | Freq: Every evening | ORAL | 11 refills | Status: DC

## 2023-08-24 MED ORDER — LIDOCAINE VISCOUS HCL 2 % MT SOLN: 5.0000 mL | Freq: Three times a day (TID) | OROMUCOSAL | 1 refills | Status: DC | PRN

## 2023-08-24 MED ORDER — FINASTERIDE 5 MG PO TABS: 5.0000 mg | ORAL_TABLET | Freq: Every day | ORAL | 3 refills | Status: DC

## 2023-08-24 NOTE — Progress Notes (Signed)
 08/24/2023 2:07 PM   Federico Hopkins 06-10-1940 [id number]  Referring provider: Cook, Jayce G, DO [address] [place],  [place] [address]  Followup dysuria   HPI: Mr Chevez is a 82yo here for followup for BPH and prostatitis. Since last visit the dysuria and chills resolved. He took 28 days of vantin . IPSS 17 QOL 3 on finasteride . He had AAA repair and after foley catheter was removed he developed dysuria. Vantin  was restarted and then dysuria resolved.   PMH: Past Medical History:  Diagnosis Date   AAA (abdominal aortic aneurysm) (HCC)    needs yearly ultrasound   Allergy    Anemia    Arthritis    Asthma    BCC (basal cell carcinoma) 08/18/1989   left shoulder blad, upper right arm, left arm beyond elbow, c&d   BCC (basal cell carcinoma) 01/31/1992   Posterior neck, curetx3, 32fu   BCC (basal cell carcinoma) 11/22/2001   mid forehead, cx3, excision, right forearm, cx3, 27fu   BCC (basal cell carcinoma) 10/09/2003   mid forehead, MOHs   BCC (basal cell carcinoma) 08/15/2008   upper left back, biopsy   BPH (benign prostatic hyperplasia)    CAD (coronary artery disease)    Cancer (HCC)    skin cancer   COPD (chronic obstructive pulmonary disease) (HCC)    Dysrhythmia    pt. states it can be fast at times   GERD (gastroesophageal reflux disease)    Glaucoma    History of acute pancreatitis 12/21/2022   HOH (hard of hearing)    Hypercholesterolemia    Hypertension    Impaired fasting glucose    Low back pain    Melanoma in situ (HCC) 10/09/2003   left chin, MOHs   MI (myocardial infarction) (HCC) 1999   Peripheral vascular disease (HCC)    AAA   SCC (squamous cell carcinoma) 07/03/2014   in situ, behind left ear, cx3, cautery, 32fu   SCC (squamous cell carcinoma) 07/03/2014   well diff, left forearm, biopsy, cx1, cautery   SCC (squamous cell carcinoma) 07/20/2017   in situ, left upper arm, cx3, 61fu   SCC (squamous cell carcinoma) 01/10/2019   in situ, left  post shoulder, cx3, 11fu   SCC (squamous cell carcinoma) 11/22/2001   left forearm distal, left forearm, cx3, 17fu   SCC (squamous cell carcinoma) 10/09/2003   [place], left ear post, clear per st, right cheek clear   SCC (squamous cell carcinoma) 03/30/2004   in situ, left upper arm, cx3, 76fu   SCC (squamous cell carcinoma) 03/08/2005   in situ, right cheek, mid upper forehead, cx3, 38fu   SCC (squamous cell carcinoma) 06/08/2006   in situ, left shoulder, cx3, 25fu   SCC (squamous cell carcinoma) 05/05/2010   right inner wrist, biopsy   SCC (squamous cell carcinoma) 09/13/2013   in situ, right crown scalp, front scalp, biopsy   Thrush     Surgical History: Past Surgical History:  Procedure Laterality Date   ABDOMINAL AORTIC ENDOVASCULAR STENT GRAFT N/A 07/29/2023   Procedure: INSERTION, ENDOVASCULAR STENT GRAFT, AORTA, ABDOMINAL;  Surgeon: Young Hensen, MD;  Location: [facility];  Service: Vascular;  Laterality: N/A;   BACK SURGERY     x 3   CARDIAC CATHETERIZATION     angioplasty   CATARACT EXTRACTION W/PHACO  03/20/2012   Procedure: CATARACT EXTRACTION PHACO AND INTRAOCULAR LENS PLACEMENT (IOC);  Surgeon: Clay Cummins, MD;  Location: [facility];  Service: Ophthalmology;  Laterality:  Right;  CDE:  8.45   CATARACT EXTRACTION W/PHACO Left 04/02/2013   Procedure: CATARACT EXTRACTION PHACO AND INTRAOCULAR LENS PLACEMENT (IOC);  Surgeon: Clay Cummins, MD;  Location: [facility];  Service: Ophthalmology;  Laterality: Left;  CDE:  6.50   CHOLECYSTECTOMY  2000   COLONOSCOPY  2009   repeat 5 years   ESOPHAGOGASTRODUODENOSCOPY     HERNIA REPAIR Left    inguinal   INGUINAL HERNIA REPAIR Right 03/28/2020   Procedure: Right Inguinal Herniorrhaphy with Mesh;  Surgeon: Alanda Allegra, MD;  Location: [facility];  Service: General;  Laterality: Right;   LAPAROSCOPIC PARTIAL COLECTOMY N/A 06/11/2013   Procedure: LAPAROSCOPIC HAND ASSISTED PARTIAL COLECTOMY;  Surgeon: Beau Bound, MD;  Location: [facility];  Service: General;  Laterality: N/A;   NASAL ENDOSCOPY WITH EPISTAXIS CONTROL Bilateral 02/11/2020   Procedure: NASAL ENDOSCOPY WITH EPISTAXIS CONTROL;  Surgeon: Reynold Caves, MD;  Location: [facility];  Service: ENT;  Laterality: Bilateral;   REPAIR ILIAC ARTERY Right 07/29/2023   Procedure: REPAIR, ARTERY, FEMORAL;  Surgeon: Young Hensen, MD;  Location: [facility];  Service: Vascular;  Laterality: Right;   right eye detached retina Bilateral    SPINAL FUSION  2016   ULTRASOUND GUIDANCE FOR VASCULAR ACCESS Bilateral 07/29/2023   Procedure: ULTRASOUND GUIDANCE, FOR VASCULAR ACCESS;  Surgeon: Young Hensen, MD;  Location: [facility];  Service: Vascular;  Laterality: Bilateral;   YAG LASER APPLICATION Left 05/06/2014   Procedure: YAG LASER APPLICATION;  Surgeon: Clay Cummins, MD;  Location: [facility];  Service: Ophthalmology;  Laterality: Left;    Home Medications:  Allergies as of 08/24/2023       Reactions   Bactrim  [sulfamethoxazole -trimethoprim ] Hives   Beta Adrenergic Blockers Diarrhea, Other (See Comments)   Lasix  [furosemide ] Rash   Doxycycline  Swelling   Lips swelling and skin peeling around mouth   Myrbetriq  [mirabegron ] Hives, Itching   Ciprofloxacin  Nausea And Vomiting, Rash, Other (See Comments)   Body aches   Dexamethasone  Swelling   Gabapentin  Swelling   Methocarbamol Swelling, Rash   Neomycin Swelling, Rash   Penicillins Swelling, Rash      Tetracyclines & Related Itching        Medication List        Accurate as of August 24, 2023  2:07 PM. If you have any questions, ask your nurse or doctor.          albuterol  108 (90 Base) MCG/ACT inhaler Commonly known as: VENTOLIN  HFA Inhale 2 puffs into the lungs every 4 (four) hours as needed for wheezing or shortness of breath.   albuterol  (2.5 MG/3ML) 0.083% nebulizer solution Commonly known as: PROVENTIL  Take 3 mLs (2.5 mg total) by nebulization every 6 (six) hours as needed for wheezing or  shortness of breath.   alfuzosin  10 MG 24 hr tablet Commonly known as: UROXATRAL  Take 1 tablet (10 mg total) by mouth daily with breakfast.   aspirin  EC 81 MG tablet Take 1 tablet (81 mg total) by mouth daily at 6 (six) AM. Swallow whole.   budesonide  0.25 MG/2ML nebulizer solution Commonly known as: Pulmicort  Use twice daily with performist   cefpodoxime  100 MG tablet Commonly known as: VANTIN  Take 1 tablet (100 mg total) by mouth 2 (two) times daily.   cetirizine 10 MG tablet Commonly known as: ZYRTEC Take 10 mg by mouth daily.   clotrimazole  1 % cream Commonly known as: LOTRIMIN  Apply to affected area 2 times daily What changed:  how  much to take how to take this when to take this reasons to take this additional instructions   Combigan  0.2-0.5 % ophthalmic solution Generic drug: brimonidine -timolol  Apply 1 drop to eye 2 (two) times daily.   esomeprazole 20 MG capsule Commonly known as: NEXIUM Take 40 mg by mouth daily.   finasteride  5 MG tablet Commonly known as: PROSCAR  Take 1 tablet (5 mg total) by mouth daily.   fluticasone -salmeterol 230-21 MCG/ACT inhaler Commonly known as: Advair  HFA Inhale 2 puffs into the lungs 2 (two) times daily.   formoterol  20 MCG/2ML nebulizer solution Commonly known as: PERFOROMIST  Take 2 mLs (20 mcg total) by nebulization 2 (two) times daily. Use in nebulizer twice daily perfectly regularly   guaiFENesin  600 MG 12 hr tablet Commonly known as: MUCINEX  Take 600 mg by mouth 2 (two) times daily as needed for cough.   HYDROcodone -acetaminophen  5-325 MG tablet Commonly known as: NORCO/VICODIN Take 1 tablet by mouth every 6 (six) hours as needed for moderate pain (pain score 4-6).   LORazepam  0.5 MG tablet Commonly known as: ATIVAN  TAKE 1 TABLET BY MOUTH ONCE DAILY AT BEDTIME AS NEEDED   lovastatin  40 MG tablet Commonly known as: MEVACOR  Take 1 tablet (40 mg total) by mouth daily.   metroNIDAZOLE  0.75 % gel Commonly known  as: METROGEL  Apply 1 Application topically daily as needed (rosacea).   oxymetazoline  0.05 % nasal spray Commonly known as: AFRIN Place 1 spray into both nostrils 2 (two) times daily as needed for congestion.   pseudoephedrine-guaifenesin  60-600 MG 12 hr tablet Commonly known as: MUCINEX  D Take 1 tablet by mouth 2 (two) times daily as needed for congestion.   Spacer/Aero-[employer]olding [employer] Use as directed   triamcinolone  cream 0.1 % Commonly known as: KENALOG  Apply 1 Application topically 2 (two) times daily as needed (itching).        Allergies:  Allergies  Allergen Reactions   Bactrim  [Sulfamethoxazole -Trimethoprim ] Hives   Beta Adrenergic Blockers Diarrhea and Other (See Comments)   Lasix  [Furosemide ] Rash   Doxycycline  Swelling    Lips swelling and skin peeling around mouth   Myrbetriq  [Mirabegron ] Hives and Itching   Ciprofloxacin  Nausea And Vomiting, Rash and Other (See Comments)    Body aches    Dexamethasone  Swelling   Gabapentin  Swelling   Methocarbamol Swelling and Rash   Neomycin Swelling and Rash   Penicillins Swelling and Rash        Tetracyclines & Related Itching    Family History: Family History  Problem Relation Age of Onset   Hypertension Mother    COPD Father    Cancer Brother        brain    Social History:  reports that he quit smoking about 20 years ago. His smoking use included cigarettes. He started smoking about 55 years ago. He has a 35 pack-year smoking history. He has never used smokeless tobacco. He reports that he does not drink alcohol and does not use drugs.  ROS: All other review of systems were reviewed and are negative except what is noted above in HPI  Physical Exam: BP 130/73   Pulse 69   Constitutional:  Alert and oriented, No acute distress. HEENT: [place] AT, moist mucus membranes.  Trachea midline, no masses. Cardiovascular: No clubbing, cyanosis, or edema. Respiratory: Normal respiratory effort, no increased work  of breathing. GI: Abdomen is soft, nontender, nondistended, no abdominal masses GU: No CVA tenderness.  Lymph: No cervical or inguinal lymphadenopathy. Skin: No rashes, bruises or suspicious  lesions. Neurologic: Grossly intact, no focal deficits, moving all 4 extremities. Psychiatric: Normal mood and affect.  Laboratory Data: Lab Results  Component Value Date   WBC 9.5 07/30/2023   HGB 11.8 (L) 07/30/2023   HCT 36.3 (L) 07/30/2023   MCV 88.8 07/30/2023   PLT 169 07/30/2023    Lab Results  Component Value Date   CREATININE 0.81 07/30/2023    Lab Results  Component Value Date   PSA 1.80 07/26/2014   PSA 1.42 03/26/2013    No results found for: "TESTOSTERONE"  Lab Results  Component Value Date   HGBA1C 6.1 (H) 04/23/2021    Urinalysis    Component Value Date/Time   COLORURINE YELLOW 07/22/2023 1443   APPEARANCEUR Clear 08/05/2023 1341   LABSPEC 1.013 07/22/2023 1443   PHURINE 7.0 07/22/2023 1443   GLUCOSEU Negative 08/05/2023 1341   HGBUR NEGATIVE 07/22/2023 1443   BILIRUBINUR Negative 08/05/2023 1341   KETONESUR NEGATIVE 07/22/2023 1443   PROTEINUR Negative 08/05/2023 1341   PROTEINUR NEGATIVE 07/22/2023 1443   UROBILINOGEN 0.2 03/29/2023 1512   UROBILINOGEN 0.2 12/04/2014 0240   NITRITE Negative 08/05/2023 1341   NITRITE NEGATIVE 07/22/2023 1443   LEUKOCYTESUR Negative 08/05/2023 1341   LEUKOCYTESUR NEGATIVE 07/22/2023 1443    Lab Results  Component Value Date   LABMICR Comment 08/05/2023   WBCUA None seen 04/30/2020   LABEPIT None seen 04/30/2020   BACTERIA None seen 04/30/2020    Pertinent Imaging:  Results for orders placed during the hospital encounter of 11/11/14  DG Abd 1 View  Narrative CLINICAL DATA:  Difficulty urinating with burning and pelvic pressure for 6 days.  EXAM: ABDOMEN - 1 VIEW  COMPARISON:  None.  FINDINGS: The bowel gas pattern is nonobstructive. Prominent stool burden in the transverse colon is noted. No  unexpected abdominal calcification is seen. The patient is status post lower lumbar fusion.  IMPRESSION: No acute abnormality.   Electronically Signed By: Etheleen Her M.D. On: 11/11/2014 17:46  Results for orders placed in visit on 03/30/22  US  Venous Img Lower Bilateral  Narrative CLINICAL DATA:  Bilateral lower extremity edema  EXAM: BILATERAL LOWER EXTREMITY VENOUS DOPPLER ULTRASOUND  TECHNIQUE: Gray-scale sonography with graded compression, as well as color Doppler and duplex ultrasound were performed to evaluate the lower extremity deep venous systems from the level of the common femoral vein and including the common femoral, femoral, profunda femoral, popliteal and calf veins including the posterior tibial, peroneal and gastrocnemius veins when visible. The superficial great saphenous vein was also interrogated. Spectral Doppler was utilized to evaluate flow at rest and with distal augmentation maneuvers in the common femoral, femoral and popliteal veins.  COMPARISON:  None Available.  FINDINGS: RIGHT LOWER EXTREMITY  Common Femoral Vein: No evidence of thrombus. Normal compressibility, respiratory phasicity and response to augmentation.  Saphenofemoral Junction: No evidence of thrombus. Normal compressibility and flow on color Doppler imaging.  Profunda Femoral Vein: No evidence of thrombus. Normal compressibility and flow on color Doppler imaging.  Femoral Vein: No evidence of thrombus. Normal compressibility, respiratory phasicity and response to augmentation.  Popliteal Vein: No evidence of thrombus. Normal compressibility, respiratory phasicity and response to augmentation.  Calf Veins: No evidence of thrombus. Normal compressibility and flow on color Doppler imaging.  Superficial Great Saphenous Vein: No evidence of thrombus. Normal compressibility.  Venous Reflux:  None.  Other Findings:  None.  LEFT LOWER EXTREMITY  Common Femoral  Vein: No evidence of thrombus. Normal compressibility, respiratory phasicity and response to augmentation.  Saphenofemoral Junction: No evidence of thrombus. Normal compressibility and flow on color Doppler imaging.  Profunda Femoral Vein: No evidence of thrombus. Normal compressibility and flow on color Doppler imaging.  Femoral Vein: No evidence of thrombus. Normal compressibility, respiratory phasicity and response to augmentation.  Popliteal Vein: No evidence of thrombus. Normal compressibility, respiratory phasicity and response to augmentation.  Calf Veins: No evidence of thrombus. Normal compressibility and flow on color Doppler imaging.  Superficial Great Saphenous Vein: No evidence of thrombus. Normal compressibility.  Venous Reflux:  None.  Other Findings:  None.  IMPRESSION: No evidence of deep venous thrombosis in either lower extremity.   Electronically Signed By: Fernando Hoyer M.D. On: 03/31/2022 09:45  No results found for this or any previous visit.  No results found for this or any previous visit.  No results found for this or any previous visit.  No results found for this or any previous visit.  No results found for this or any previous visit.  No results found for this or any previous visit.   Assessment & Plan:    1. BPH associated with nocturia (Primary) Uroxatral  10mg  at bedtime and finasteride  5mg  daily  2. Nocturia Uroxatral  10mg  daily and fiansteride 5mg  daily  3. Chronic prostatitis without hematuria resolved   No follow-ups on file.  Johnie Nailer, MD  [facility] [place]

## 2023-08-25 ENCOUNTER — Telehealth: Payer: Self-pay | Admitting: Internal Medicine

## 2023-08-25 DIAGNOSIS — J449 Chronic obstructive pulmonary disease, unspecified: Secondary | ICD-10-CM

## 2023-08-25 DIAGNOSIS — J4550 Severe persistent asthma, uncomplicated: Secondary | ICD-10-CM

## 2023-08-25 NOTE — Telephone Encounter (Signed)
 Copied from CRM [id number]. Topic: Clinical - Prescription Issue >> Aug 25, 2023 10:41 AM Nila Nephew wrote: Reason for CRM: Patient calling in to state that he got these medications (formoterol (PERFOROMIST) 20 MCG/2ML nebulizer solution,budesonide (PULMICORT) 0.25 MG/2ML nebulizer solution) in through [facility] but that he only got the medication and not the administration equipment. [facility] is requesting a prescription be written for the nebulizer equipment so that they may send him the administration equipment for these medicines.

## 2023-08-25 NOTE — Telephone Encounter (Signed)
 ORDER PLACED FOR NEB MACHINE TO BE SENT TO [facility]

## 2023-08-29 ENCOUNTER — Ambulatory Visit (HOSPITAL_COMMUNITY)
Admission: RE | Admit: 2023-08-29 | Discharge: 2023-08-29 | Disposition: A | Discharge status: Home / Self Care | Source: Ambulatory Visit | Attending: Surgery | Admitting: Surgery

## 2023-08-29 ENCOUNTER — Ambulatory Visit: Payer: Self-pay

## 2023-08-29 DIAGNOSIS — I7143 Infrarenal abdominal aortic aneurysm, without rupture: Secondary | ICD-10-CM | POA: Diagnosis not present

## 2023-08-29 DIAGNOSIS — N281 Cyst of kidney, acquired: Secondary | ICD-10-CM | POA: Diagnosis not present

## 2023-08-29 DIAGNOSIS — I701 Atherosclerosis of renal artery: Secondary | ICD-10-CM | POA: Diagnosis not present

## 2023-08-29 DIAGNOSIS — I714 Abdominal aortic aneurysm, without rupture, unspecified: Secondary | ICD-10-CM | POA: Diagnosis not present

## 2023-08-29 DIAGNOSIS — K5730 Diverticulosis of large intestine without perforation or abscess without bleeding: Secondary | ICD-10-CM | POA: Diagnosis not present

## 2023-08-29 MED ORDER — IOHEXOL 350 MG/ML SOLN
100.0000 mL | Freq: Once | INTRAVENOUS | Status: AC | PRN
  Administered 2023-08-29: 100 mL via INTRAVENOUS

## 2023-08-29 NOTE — Telephone Encounter (Signed)
 Chief Complaint: thrush Symptoms: white patches, burning pain in the mouth, intermittent abd pain Frequency: thrush symptoms since last Thursday Pertinent Negatives: Patient denies fever, CP, SOB, difficulty swallowing or breathing Disposition: [] ED /[] Urgent Care (no appt availability in office) / [x] Appointment(In office/virtual)/ []  [place] [facility]/ [] Home Care/ [] Refused Recommended Disposition /[] [facility]/ []  Follow-up with PCP Additional Notes: Pt reports signs of thrush. Pt states he took antibiotics for 30 days recently and developed signs of thrush last Thursday. Pt states his mouth looks like cottage cheese and that there are apparent blisters at the back of his tongue. Pt reports burning pain in his mouth but denies difficulty swallowing or breathing. Pt also endorses that he "doesn't feel well all over." Endorses joint pain with arthritis and intermittent abdominal pain. States he is taking a probiotic and had to take a laxative over the weekend but that he was able to have a BM after that. Denies CP or SOB. RN offered pt a same-day appt but pt states he has to have a CT scan performed today at the hospital and can't be seen today. RN scheduled pt for tomorrow at 1040. RN advised pt if he becomes unable to swallow or has difficulty breathing, CP, N/V, fever, or any worsening that he needs to go to the ED. Pt verbalized understanding.    Copied from CRM [id number]. Topic: Appointments - Appointment Scheduling >> Aug 29, 2023  8:05 AM Emylou G wrote: Thrust.. wants to f/u on abnormal blood tests.Marland Kitchen abdominal pain. Not feeling well.. [phone].. pls call before 1200 or after 130 Reason for Disposition  [1] White patches that stick to tongue or inner cheek AND [2] can be wiped off  Answer Assessment - Initial Assessment Questions 1. SYMPTOM: "What's the main symptom you're concerned about?" (e.g., chapped lips, dry mouth, lump, sores)     "Mouth looks like cottage  cheese", "burning", "blisters" 2. ONSET: "When did the thrush start?"     Last Thursday 3. PAIN: "Is there any pain?" If Yes, ask: "How bad is it?" (Scale: 1-10; mild, moderate, severe)   - MILD (1-3):  doesn't interfere with eating or normal activities   - MODERATE (4-7): interferes with eating some solids and normal activities   - SEVERE (8-10):  excruciating pain, interferes with most normal activities   - SEVERE DYSPHAGIA: can't swallow liquids, drooling     "The roof of my mouth even in the back of my throat and nostrils are real irritated, the back of my tongue looks like cottage cheese with blisters, states he is able to swallow 4. CAUSE: "What do you think is causing the symptoms?"     Thrush  5. OTHER SYMPTOMS: "Do you have any other symptoms?" (e.g., fever, sore throat, toothache, swelling)  Wants to follow-up on abnormal blood tests, states he "is not feeling well all over." Endorses joint pain from arthritis. Endorses intermittent stomach pain. Taking probiotic. Denies N/V/D. States he took a laxative over the weekend  d/t constipation - states he has had a BM. Pt has a CT scan this afternoon. No fever. Pt states he's had a chill off and on.    AAA stent repaired 3/14 - prior to that and before that pt states he had a "pretty good infection in my kidneys" and infection in  his prostate, states he took antibiotics for 30 days and thinks that why he has thrush. "Lidocaine sets me on fire but isn't doing any good"  Protocols used: Mouth Symptoms-A-AH

## 2023-08-30 ENCOUNTER — Encounter: Payer: Self-pay | Admitting: Cardiovascular Disease

## 2023-08-30 ENCOUNTER — Ambulatory Visit (INDEPENDENT_AMBULATORY_CARE_PROVIDER_SITE_OTHER): Admitting: Family Medicine

## 2023-08-30 ENCOUNTER — Ambulatory Visit: Payer: Self-pay

## 2023-08-30 VITALS — BP 118/72 | HR 88 | Temp 97.3°F | Ht 73.0 in | Wt 212.0 lb

## 2023-08-30 DIAGNOSIS — R5383 Other fatigue: Secondary | ICD-10-CM | POA: Diagnosis not present

## 2023-08-30 DIAGNOSIS — D649 Anemia, unspecified: Secondary | ICD-10-CM

## 2023-08-30 DIAGNOSIS — E785 Hyperlipidemia, unspecified: Secondary | ICD-10-CM | POA: Diagnosis not present

## 2023-08-30 DIAGNOSIS — R7303 Prediabetes: Secondary | ICD-10-CM

## 2023-08-30 DIAGNOSIS — B370 Candidal stomatitis: Secondary | ICD-10-CM

## 2023-08-30 DIAGNOSIS — I10 Essential (primary) hypertension: Secondary | ICD-10-CM

## 2023-08-30 MED ORDER — FLUCONAZOLE 200 MG PO TABS: 200.0000 mg | ORAL_TABLET | Freq: Every day | ORAL | 0 refills | Status: DC

## 2023-08-30 NOTE — Patient Instructions (Signed)
 Labs today.  Diflucan as prescribed.  Follow up in 1 month.

## 2023-08-30 NOTE — Telephone Encounter (Signed)
 Copied from CRM [id number]. Topic: Clinical - Red Word Triage >> Aug 30, 2023  9:33 AM Juliana Ocean wrote: Red Word that prompted transfer to Nurse Triage: pt put on med ormoterol (PERFOROMIST) 20 MCG/2ML nebulizer solution Pt cannot sleep.  Does not have productive cough  feels worse than before. Pt has a dry, hacking cough   Patient reports that he started taking Formoterol and budesonide last week and for the last 3-4 days he has been having increased difficulty sleeping at night. He states that he believes the medications are the reason for his difficulty sleeping and wanted advice on if he should continue to take them or not. Patient would like a call with recommendations for his treatment. Patient states he will be unavailable from 10 am until noon due to another appointment.     Reason for Disposition  [1] Caller has URGENT medicine question about med that PCP or specialist prescribed AND [2] triager unable to answer question  Answer Assessment - Initial Assessment Questions 1. NAME of MEDICINE: "What medicine(s) are you calling about?"     Formoterol and Budesonide  2. QUESTION: "What is your question?" (e.g., double dose of medicine, side effect)     Causing side effects  3. PRESCRIBER: "Who prescribed the medicine?" Reason: if prescribed by specialist, call should be referred to that group.     Dr. Waymond Hailey 4. SYMPTOMS: "Do you have any symptoms?" If Yes, ask: "What symptoms are you having?"  "How bad are the symptoms (e.g., mild, moderate, severe)     Difficulty sleeping, feeling worse than before starting medication  Protocols used: Medication Question Call-A-AH

## 2023-08-30 NOTE — Assessment & Plan Note (Signed)
 Etiology unclear at this time.  Patient is overall well-appearing.  He has multiple comorbidities and recently had a major surgery.  I believe that this is the primary culprit.  However, will obtain labs for further evaluation and monitor closely.

## 2023-08-30 NOTE — Assessment & Plan Note (Signed)
 Mild anemia noted on labs.  This is likely secondary to recent surgery.  Patient very concerned.  Reassessing with CBC and iron studies.

## 2023-08-30 NOTE — Progress Notes (Signed)
 Subjective:  Patient ID: [name] [name], male    DOB: 1941/01/04  Age: 83 y.o. MRN: [id number]  CC:   Chief Complaint  Patient presents with   thrush and yeast due to multiple abx use    Using magic mouth wash w/ lido- burning , feels down windpipe and lungs Recent ABD aneurysm repair , go over lab work abnormals Fatigue, no appetite    HPI:  83 year old male with CAD, abdominal aorta aneurysm status post recent repair, hypertension, COPD/asthma, GERD, IBS, chronic constipation, BPH, hyperlipidemia presents with multiple complaints.  Patient reports that he has been on several antibiotics over the past few months.  Most recently on antibiotic therapy by urology due to prostatitis.  Patient states that he has been experiencing what he believes to be is thrush.  He reports that he has had burning sensation in his mouth.  He is also had cottage cheeselike debris in his mouth and concern for "blisters" on the back of his tongue.  Patient is concerned that this is further down in his throat.  He was recently treated with Magic mouthwash by urology.  Patient recently had AAA repair.  He states that he is doing well regarding this.  Denies abdominal pain at this time.  Patient also reports that he overall does not feel well.  He states that he has little to no energy and no appetite.  However, he states that he has been eating high sugar foods as he feels like it is the only thing that he really wants to eat.  He is concerned about this raising his sugar.  Patient also concerned about his most recent labs which revealed mild hyponatremia with a sodium of 134 and mild anemia with a hemoglobin of 11.8.  Prior hemoglobin on 3/7 was normal at 13.8.  I suspect that this is related to recent surgery.  Patient Active Problem List   Diagnosis Date Noted   Anemia 08/30/2023   Thrush 08/30/2023   Fatigue 08/30/2023   Cervical radiculopathy 08/15/2023   BPH associated with nocturia 05/24/2023   Chronic  asthma, severe vs ACOS 05/05/2023   Celiac artery stenosis (HCC) 02/02/2023   Chronic idiopathic constipation 12/21/2022   Osteoarthritis of knees, bilateral 04/14/2022   Bilateral primary osteoarthritis of knee 04/14/2022   Irritable bowel syndrome 03/17/2022   Recurrent right lower quadrant abdominal pain 03/03/2022   Seasonal allergies 08/06/2021   COPD (chronic obstructive pulmonary disease) (HCC) 04/23/2021   CAD (coronary artery disease) 04/23/2021   Hypertension 09/16/2016   Lumbar spondylosis 11/05/2014   GERD (gastroesophageal reflux disease) 01/19/2012   Hyperlipidemia 04/15/2008   AAA (abdominal aortic aneurysm) (HCC) 04/15/2008    Social Hx   Social History   Socioeconomic History   Marital status: Widowed    Spouse name: Not on file   Number of children: Not on file   Years of education: Not on file   Highest education level: Not on file  Occupational History   Not on file  Tobacco Use   Smoking status: Former    Current packs/day: 0.00    Average packs/day: 1 pack/day for 35.0 years (35.0 ttl pk-yrs)    Types: Cigarettes    Start date: 03/27/1968    Quit date: 03/28/2003    Years since quitting: 20.4   Smokeless tobacco: Never  Vaping Use   Vaping status: Never Used  Substance and Sexual Activity   Alcohol use: No    Alcohol/week: 0.0 standard drinks of alcohol  Drug use: No   Sexual activity: Not Currently    Birth control/protection: None  Other Topics Concern   Not on file  Social History Narrative   Not on file   Social Drivers of Health   Financial Resource Strain: Low Risk  (03/18/2022)   Overall Financial Resource Strain (CARDIA)    Difficulty of Paying Living Expenses: Not very hard  Food Insecurity: No Food Insecurity (08/01/2023)   Hunger Vital Sign    Worried About Running Out of Food in the Last Year: Never true    Ran Out of Food in the Last Year: Never true  Transportation Needs: No Transportation Needs (08/01/2023)   PRAPARE -  Administrator, Civil Service (Medical): No    Lack of Transportation (Non-Medical): No  Physical Activity: Insufficiently Active (03/18/2022)   Exercise Vital Sign    Days of Exercise per Week: 2 days    Minutes of Exercise per Session: 20 min  Stress: No Stress Concern Present (03/18/2022)   [employer] of Occupational Health - Occupational Stress Questionnaire    Feeling of Stress : Not at all  Social Connections: Socially Isolated (07/30/2023)   Social Connection and Isolation Panel [NHANES]    Frequency of Communication with Friends and Family: More than three times a week    Frequency of Social Gatherings with Friends and Family: Three times a week    Attends Religious Services: Never    Active Member of Clubs or Organizations: No    Attends Banker Meetings: Never    Marital Status: Widowed    Review of Systems Per HPI  Objective:  BP 118/72   Pulse 88   Temp (!) 97.3 F (36.3 C)   Ht 6\' 1"  (1.854 m)   Wt 212 lb (96.2 kg)   SpO2 99%   BMI 27.97 kg/m      08/30/2023   10:48 AM 08/24/2023    1:41 PM 08/15/2023    3:25 PM  BP/Weight  Systolic BP 118 130 132  Diastolic BP 72 73 77  Wt. (Lbs) 212  207  BMI 27.97 kg/m2  27.31 kg/m2    Physical Exam Vitals and nursing note reviewed.  Constitutional:      General: He is not in acute distress.    Appearance: Normal appearance.  HENT:     Head: Normocephalic and atraumatic.     Mouth/Throat:     Comments: Mouth is clear.  There is no appreciable thrush at this time. Eyes:     General:        Right eye: No discharge.        Left eye: No discharge.     Conjunctiva/sclera: Conjunctivae normal.  Cardiovascular:     Rate and Rhythm: Normal rate and regular rhythm.  Pulmonary:     Effort: Pulmonary effort is normal.     Breath sounds: Normal breath sounds.  Abdominal:     Palpations: Abdomen is soft.     Tenderness: There is no abdominal tenderness.  Neurological:     Mental Status:  He is alert.  Psychiatric:     Comments: Anxious.     Lab Results  Component Value Date   WBC 9.5 07/30/2023   HGB 11.8 (L) 07/30/2023   HCT 36.3 (L) 07/30/2023   PLT 169 07/30/2023   GLUCOSE 111 (H) 07/30/2023   CHOL 167 04/28/2023   TRIG 99 04/28/2023   HDL 62 04/28/2023   LDLCALC 87 04/28/2023   ALT  17 07/22/2023   AST 21 07/22/2023   NA 134 (L) 07/30/2023   K 3.6 07/30/2023   CL 104 07/30/2023   CREATININE 0.81 07/30/2023   BUN 10 07/30/2023   CO2 24 07/30/2023   TSH 1.230 02/14/2019   PSA 1.80 07/26/2014   INR 1.1 07/29/2023   HGBA1C 6.1 (H) 04/23/2021     Assessment & Plan:  Anemia, unspecified type Assessment & Plan: Mild anemia noted on labs.  This is likely secondary to recent surgery.  Patient very concerned.  Reassessing with CBC and iron studies.  Orders: -     CBC -     Iron, TIBC and Ferritin Panel  Primary hypertension -     CMP14+EGFR  Prediabetes -     Hemoglobin A1c  Hyperlipidemia, unspecified hyperlipidemia type -     Lipid panel  Fatigue, unspecified type Assessment & Plan: Etiology unclear at this time.  Patient is overall well-appearing.  He has multiple comorbidities and recently had a major surgery.  I believe that this is the primary culprit.  However, will obtain labs for further evaluation and monitor closely.  Orders: -     TSH  Thrush Assessment & Plan: Patient's reported history is consistent with thrush.  There is no visible thrush on exam today.  Treating with Diflucan to ensure cure.   Other orders -     Fluconazole; Take 1 tablet (200 mg total) by mouth daily.  Dispense: 10 tablet; Refill: 0    Follow-up:  1 month  Galaxy Borden DO [facility]

## 2023-08-30 NOTE — Assessment & Plan Note (Signed)
 Patient's reported history is consistent with thrush.  There is no visible thrush on exam today.  Treating with Diflucan to ensure cure.

## 2023-08-30 NOTE — Telephone Encounter (Signed)
error 

## 2023-08-31 ENCOUNTER — Encounter: Payer: Self-pay | Admitting: Family Medicine

## 2023-08-31 LAB — CMP14+EGFR
ALT: 12 IU/L (ref 0–44)
AST: 16 IU/L (ref 0–40)
Albumin: 3.8 g/dL (ref 3.7–4.7)
Alkaline Phosphatase: 71 IU/L (ref 44–121)
BUN/Creatinine Ratio: 14 (ref 10–24)
BUN: 11 mg/dL (ref 8–27)
Bilirubin Total: 0.4 mg/dL (ref 0.0–1.2)
CO2: 25 mmol/L (ref 20–29)
Calcium: 8.7 mg/dL (ref 8.6–10.2)
Chloride: 101 mmol/L (ref 96–106)
Creatinine, Ser: 0.79 mg/dL (ref 0.76–1.27)
Globulin, Total: 2.7 g/dL (ref 1.5–4.5)
Glucose: 95 mg/dL (ref 70–99)
Potassium: 4.5 mmol/L (ref 3.5–5.2)
Sodium: 141 mmol/L (ref 134–144)
Total Protein: 6.5 g/dL (ref 6.0–8.5)
eGFR: 89 mL/min/{1.73_m2} (ref >59)

## 2023-08-31 LAB — IRON,TIBC AND FERRITIN PANEL
Ferritin: 17 ng/mL — ABNORMAL LOW (ref 30–400)
Iron Saturation: 13 % — ABNORMAL LOW (ref 15–55)
Iron: 38 ug/dL (ref 38–169)
Total Iron Binding Capacity: 291 ug/dL (ref 250–450)
UIBC: 253 ug/dL (ref 111–343)

## 2023-08-31 LAB — CBC
Hematocrit: 40.1 % (ref 37.5–51.0)
Hemoglobin: 12.9 g/dL — ABNORMAL LOW (ref 13.0–17.7)
MCH: 27.7 pg (ref 26.6–33.0)
MCHC: 32.2 g/dL (ref 31.5–35.7)
MCV: 86 fL (ref 79–97)
Platelets: 165 10*3/uL (ref 150–450)
RBC: 4.66 x10E6/uL (ref 4.14–5.80)
RDW: 12.7 % (ref 11.6–15.4)
WBC: 7.2 10*3/uL (ref 3.4–10.8)

## 2023-08-31 LAB — TSH: TSH: 0.961 u[IU]/mL (ref 0.450–4.500)

## 2023-08-31 LAB — LIPID PANEL
Chol/HDL Ratio: 3 ratio (ref 0.0–5.0)
Cholesterol, Total: 134 mg/dL (ref 100–199)
HDL: 45 mg/dL (ref >39)
LDL Chol Calc (NIH): 68 mg/dL (ref 0–99)
Triglycerides: 115 mg/dL (ref 0–149)
VLDL Cholesterol Cal: 21 mg/dL (ref 5–40)

## 2023-08-31 LAB — HEMOGLOBIN A1C
Est. average glucose Bld gHb Est-mCnc: 126 mg/dL
Hgb A1c MFr Bld: 6 % — ABNORMAL HIGH (ref 4.8–5.6)

## 2023-09-01 NOTE — Telephone Encounter (Signed)
 I called and spoke with the pt and notified of response from Tammy  He will go back to Advair for now  He still has some and denied needing new rx  I reminded him to rinse mouth after use  Routing to MW as [name] [name]

## 2023-09-01 NOTE — Telephone Encounter (Signed)
 Note reviewed August 15, 2023-was changed from Advair to nebulizer.  If unable to tolerate stop neb medicines with budesonide and formoterol and return back to Advair. Keep follow-up with Dr. Waymond Hailey  Will send to Dr. Waymond Hailey for FYI  Please contact office for sooner follow up if symptoms do not improve or worsen or seek emergency care

## 2023-09-01 NOTE — Telephone Encounter (Signed)
 The order for neb machine was placed 08/25/23- called and spoke with the pt and verified that he did receive the machine. There is another encounter regarding issue with side effects that has been routed to provider of the day for recommendations. Closing this encounter.

## 2023-09-04 ENCOUNTER — Encounter: Payer: Self-pay | Admitting: Urology

## 2023-09-04 NOTE — Patient Instructions (Signed)

## 2023-09-05 ENCOUNTER — Encounter: Admitting: Surgery

## 2023-09-05 ENCOUNTER — Other Ambulatory Visit: Payer: Self-pay | Admitting: Family Medicine

## 2023-09-05 DIAGNOSIS — G4700 Insomnia, unspecified: Secondary | ICD-10-CM

## 2023-09-06 ENCOUNTER — Ambulatory Visit (INDEPENDENT_AMBULATORY_CARE_PROVIDER_SITE_OTHER): Admitting: Vascular Surgery

## 2023-09-06 ENCOUNTER — Encounter: Payer: Self-pay | Admitting: Vascular Surgery

## 2023-09-06 VITALS — BP 156/101 | HR 71 | Temp 98.0°F | Resp 20 | Ht 73.0 in | Wt 207.0 lb

## 2023-09-06 DIAGNOSIS — I7143 Infrarenal abdominal aortic aneurysm, without rupture: Secondary | ICD-10-CM

## 2023-09-06 NOTE — Progress Notes (Signed)
 Patient name: [name] [name] MRN: [id number] DOB: 1941/04/25 Sex: male  REASON FOR CONSULT: Post-op EVAR  HPI: [name] [name] is a 83 y.o. male, that presents for postop check after repair of 5.7 cm infrarenal abdominal aortic aneurysm on 07/29/2023 with aortobiiliac stent graft.  We used IVUS and landed the graft below the mural thrombus at his renal arteries.  This did require cutdown on the right common femoral artery with primary repair.  He ultimately did well and was discharged home.  States his right groin is healed without issue.  No abdominal pain.  No lower extremity complaints.  Past Medical History:  Diagnosis Date   AAA (abdominal aortic aneurysm) (HCC)    needs yearly ultrasound   Allergy    Anemia    Arthritis    Asthma    BCC (basal cell carcinoma) 08/18/1989   left shoulder blad, upper right arm, left arm beyond elbow, c&d   BCC (basal cell carcinoma) 01/31/1992   Posterior neck, curetx3, 44fu   BCC (basal cell carcinoma) 11/22/2001   mid forehead, cx3, excision, right forearm, cx3, 12fu   BCC (basal cell carcinoma) 10/09/2003   mid forehead, MOHs   BCC (basal cell carcinoma) 08/15/2008   upper left back, biopsy   BPH (benign prostatic hyperplasia)    CAD (coronary artery disease)    Cancer (HCC)    skin cancer   COPD (chronic obstructive pulmonary disease) (HCC)    Dysrhythmia    pt. states it can be fast at times   GERD (gastroesophageal reflux disease)    Glaucoma    History of acute pancreatitis 12/21/2022   HOH (hard of hearing)    Hypercholesterolemia    Hypertension    Impaired fasting glucose    Low back pain    Melanoma in situ (HCC) 10/09/2003   left chin, MOHs   MI (myocardial infarction) (HCC) 1999   Peripheral vascular disease (HCC)    AAA   SCC (squamous cell carcinoma) 07/03/2014   in situ, behind left ear, cx3, cautery, 84fu   SCC (squamous cell carcinoma) 07/03/2014   well diff, left forearm, biopsy, cx1, cautery   SCC (squamous cell  carcinoma) 07/20/2017   in situ, left upper arm, cx3, 61fu   SCC (squamous cell carcinoma) 01/10/2019   in situ, left post shoulder, cx3, 64fu   SCC (squamous cell carcinoma) 11/22/2001   left forearm distal, left forearm, cx3, 53fu   SCC (squamous cell carcinoma) 10/09/2003   Bowens, left ear post, clear per st, right cheek clear   SCC (squamous cell carcinoma) 03/30/2004   in situ, left upper arm, cx3, 66fu   SCC (squamous cell carcinoma) 03/08/2005   in situ, right cheek, mid upper forehead, cx3, 52fu   SCC (squamous cell carcinoma) 06/08/2006   in situ, left shoulder, cx3, 12fu   SCC (squamous cell carcinoma) 05/05/2010   right inner wrist, biopsy   SCC (squamous cell carcinoma) 09/13/2013   in situ, right crown scalp, front scalp, biopsy   Thrush     Past Surgical History:  Procedure Laterality Date   ABDOMINAL AORTIC ENDOVASCULAR STENT GRAFT N/A 07/29/2023   Procedure: INSERTION, ENDOVASCULAR STENT GRAFT, AORTA, ABDOMINAL;  Surgeon: Young Hensen, MD;  Location: [facility] OR;  Service: Vascular;  Laterality: N/A;   BACK SURGERY     x 3   CARDIAC CATHETERIZATION     angioplasty   CATARACT EXTRACTION W/PHACO  03/20/2012   Procedure: CATARACT EXTRACTION PHACO AND INTRAOCULAR LENS  PLACEMENT (IOC);  Surgeon: Clay Cummins, MD;  Location: [facility];  Service: Ophthalmology;  Laterality: Right;  CDE:  8.45   CATARACT EXTRACTION W/PHACO Left 04/02/2013   Procedure: CATARACT EXTRACTION PHACO AND INTRAOCULAR LENS PLACEMENT (IOC);  Surgeon: Clay Cummins, MD;  Location: [facility];  Service: Ophthalmology;  Laterality: Left;  CDE:  6.50   CHOLECYSTECTOMY  2000   COLONOSCOPY  2009   repeat 5 years   ESOPHAGOGASTRODUODENOSCOPY     HERNIA REPAIR Left    inguinal   INGUINAL HERNIA REPAIR Right 03/28/2020   Procedure: Right Inguinal Herniorrhaphy with Mesh;  Surgeon: Alanda Allegra, MD;  Location: [facility];  Service: General;  Laterality: Right;   LAPAROSCOPIC PARTIAL COLECTOMY N/A 06/11/2013    Procedure: LAPAROSCOPIC HAND ASSISTED PARTIAL COLECTOMY;  Surgeon: Beau Bound, MD;  Location: [facility];  Service: General;  Laterality: N/A;   NASAL ENDOSCOPY WITH EPISTAXIS CONTROL Bilateral 02/11/2020   Procedure: NASAL ENDOSCOPY WITH EPISTAXIS CONTROL;  Surgeon: Reynold Caves, MD;  Location: [facility];  Service: ENT;  Laterality: Bilateral;   REPAIR ILIAC ARTERY Right 07/29/2023   Procedure: REPAIR, ARTERY, FEMORAL;  Surgeon: Young Hensen, MD;  Location: [facility] OR;  Service: Vascular;  Laterality: Right;   right eye detached retina Bilateral    SPINAL FUSION  2016   ULTRASOUND GUIDANCE FOR VASCULAR ACCESS Bilateral 07/29/2023   Procedure: ULTRASOUND GUIDANCE, FOR VASCULAR ACCESS;  Surgeon: Young Hensen, MD;  Location[facility];  Service: Vascular;  Laterality: Bilateral;   YAG LASER APPLICATION Left 05/06/2014   Procedure: YAG LASER APPLICATION;  Surgeon: Clay Cummins, MD;  Location: [facility];  Service: Ophthalmology;  Laterality: Left;    Family History  Problem Relation Age of Onset   Hypertension Mother    COPD Father    Cancer Brother        brain    SOCIAL HISTORY: Social History   Socioeconomic History   Marital status: Widowed    Spouse name: Not on file   Number of children: Not on file   Years of education: Not on file   Highest education level: Not on file  Occupational History   Not on file  Tobacco Use   Smoking status: Former    Current packs/day: 0.00    Average packs/day: 1 pack/day for 35.0 years (35.0 ttl pk-yrs)    Types: Cigarettes    Start date: 03/27/1968    Quit date: 03/28/2003    Years since quitting: 20.4   Smokeless tobacco: Never  Vaping Use   Vaping status: Never Used  Substance and Sexual Activity   Alcohol use: No    Alcohol/week: 0.0 standard drinks of alcohol   Drug use: No   Sexual activity: Not Currently    Birth control/protection: None  Other Topics Concern   Not on file  Social History Narrative   Not on  file   Social Drivers of Health   Financial Resource Strain: Low Risk  (03/18/2022)   Overall Financial Resource Strain (CARDIA)    Difficulty of Paying Living Expenses: Not very hard  Food Insecurity: No Food Insecurity (08/01/2023)   Hunger Vital Sign    Worried About Running Out of Food in the Last Year: Never true    Ran Out of Food in the Last Year: Never true  Transportation Needs: No Transportation Needs (08/01/2023)   PRAPARE - Administrator, Civil Service (Medical): No    Lack of Transportation (Non-Medical): No  Physical Activity: Insufficiently Active (03/18/2022)   Exercise Vital Sign    Days of Exercise per Week: 2 days    Minutes of Exercise per Session: 20 min  Stress: No Stress Concern Present (03/18/2022)   [employer] of Occupational Health - Occupational Stress Questionnaire    Feeling of Stress : Not at all  Social Connections: Socially Isolated (07/30/2023)   Social Connection and Isolation Panel [NHANES]    Frequency of Communication with Friends and Family: More than three times a week    Frequency of Social Gatherings with Friends and Family: Three times a week    Attends Religious Services: Never    Active Member of Clubs or Organizations: No    Attends Banker Meetings: Never    Marital Status: Widowed  Intimate Partner Violence: Not At Risk (08/01/2023)   Humiliation, Afraid, Rape, and Kick questionnaire    Fear of Current or Ex-Partner: No    Emotionally Abused: No    Physically Abused: No    Sexually Abused: No    Allergies  Allergen Reactions   Bactrim  [Sulfamethoxazole -Trimethoprim ] Hives   Beta Adrenergic Blockers Diarrhea and Other (See Comments)   Lasix  [Furosemide ] Rash   Doxycycline  Swelling    Lips swelling and skin peeling around mouth   Myrbetriq  [Mirabegron ] Hives and Itching   Ciprofloxacin  Nausea And Vomiting, Rash and Other (See Comments)    Body aches    Dexamethasone  Swelling   Gabapentin  Swelling    Methocarbamol Swelling and Rash   Neomycin Swelling and Rash   Penicillins Swelling and Rash        Tetracyclines & Related Itching    Current Outpatient Medications  Medication Sig Dispense Refill   albuterol  (PROVENTIL ) (2.5 MG/3ML) 0.083% nebulizer solution Take 3 mLs (2.5 mg total) by nebulization every 6 (six) hours as needed for wheezing or shortness of breath. 360 mL 5   albuterol  (VENTOLIN  HFA) 108 (90 Base) MCG/ACT inhaler Inhale 2 puffs into the lungs every 4 (four) hours as needed for wheezing or shortness of breath. 18 each 0   alfuzosin  (UROXATRAL ) 10 MG 24 hr tablet Take 1 tablet (10 mg total) by mouth at bedtime. 30 tablet 11   aspirin  EC 81 MG tablet Take 1 tablet (81 mg total) by mouth daily at 6 (six) AM. Swallow whole. 30 tablet 12   budesonide  (PULMICORT ) 0.25 MG/2ML nebulizer solution Use twice daily with performist 120 mL 12   cefpodoxime  (VANTIN ) 100 MG tablet Take 1 tablet (100 mg total) by mouth 2 (two) times daily. 56 tablet 0   cetirizine (ZYRTEC) 10 MG tablet Take 10 mg by mouth daily.     clotrimazole  (LOTRIMIN ) 1 % cream Apply to affected area 2 times daily (Patient taking differently: Apply 1 Application topically 2 (two) times daily as needed (itching).) 15 g 0   COMBIGAN  0.2-0.5 % ophthalmic solution Apply 1 drop to eye 2 (two) times daily.     esomeprazole (NEXIUM) 20 MG capsule Take 40 mg by mouth daily.     finasteride  (PROSCAR ) 5 MG tablet Take 1 tablet (5 mg total) by mouth daily. 90 tablet 3   fluconazole  (DIFLUCAN ) 200 MG tablet Take 1 tablet (200 mg total) by mouth daily. 10 tablet 0   fluticasone -salmeterol (ADVAIR  HFA) 230-21 MCG/ACT inhaler Inhale 2 puffs into the lungs 2 (two) times daily.     formoterol  (PERFOROMIST ) 20 MCG/2ML nebulizer solution Take 2 mLs (20 mcg total) by nebulization 2 (two) times daily. Use in nebulizer  twice daily perfectly regularly 120 mL 11   guaiFENesin  (MUCINEX ) 600 MG 12 hr tablet Take 600 mg by mouth 2 (two) times  daily as needed for cough.     HYDROcodone -acetaminophen  (NORCO/VICODIN) 5-325 MG tablet Take 1 tablet by mouth every 6 (six) hours as needed for moderate pain (pain score 4-6). 8 tablet 0   [START ON 09/15/2023] LORazepam  (ATIVAN ) 0.5 MG tablet Take 1 tablet (0.5 mg total) by mouth at bedtime as needed. 30 tablet 3   lovastatin  (MEVACOR ) 40 MG tablet Take 1 tablet (40 mg total) by mouth daily. 90 tablet 0   magic mouthwash (lidocaine , diphenhydrAMINE, alum & mag hydroxide) suspension Swish and spit 5 mLs 3 (three) times daily as needed for mouth pain. 360 mL 1   metroNIDAZOLE  (METROGEL ) 0.75 % gel Apply 1 Application topically daily as needed (rosacea).     oxymetazoline  (AFRIN) 0.05 % nasal spray Place 1 spray into both nostrils 2 (two) times daily as needed for congestion.     pseudoephedrine-guaifenesin  (MUCINEX  D) 60-600 MG 12 hr tablet Take 1 tablet by mouth 2 (two) times daily as needed for congestion.     Spacer/Aero-Holding Ismael Maria Use as directed 1 each 0   triamcinolone  cream (KENALOG ) 0.1 % Apply 1 Application topically 2 (two) times daily as needed (itching).     No current facility-administered medications for this visit.    REVIEW OF SYSTEMS:  [X]  denotes positive finding, [ ]  denotes negative finding Cardiac  Comments:  Chest pain or chest pressure:    Shortness of breath upon exertion:    Short of breath when lying flat:    Irregular heart rhythm:        Vascular    Pain in calf, thigh, or hip brought on by ambulation:    Pain in feet at night that wakes you up from your sleep:     Blood clot in your veins:    Leg swelling:         Pulmonary    Oxygen at home:    Productive cough:     Wheezing:         Neurologic    Sudden weakness in arms or legs:     Sudden numbness in arms or legs:     Sudden onset of difficulty speaking or slurred speech:    Temporary loss of vision in one eye:     Problems with dizziness:         Gastrointestinal    Blood in stool:      Vomited blood:         Genitourinary    Burning when urinating:     Blood in urine:        Psychiatric    Major depression:         Hematologic    Bleeding problems:    Problems with blood clotting too easily:        Skin    Rashes or ulcers:        Constitutional    Fever or chills:      PHYSICAL EXAM: Vitals:   09/06/23 1420  BP: (!) 156/101  Pulse: 71  Resp: 20  Temp: 98 F (36.7 C)  TempSrc: Temporal  SpO2: 95%  Weight: 207 lb (93.9 kg)  Height: 6\' 1"  (1.854 m)    GENERAL: The patient is a well-nourished male, in no acute distress. The vital signs are documented above. CARDIAC: There is a regular rate and rhythm.  VASCULAR:  Bilateral femoral pulses palpable Right groin incision well-healed Bilateral PT pulses palpable   DATA:   CTA reviewed 08/29/2023 with excellent position of stent graft below mural thrombus in the infrarenal aorta with exclusion of aneurysm and no obvious endoleak  Assessment/Plan:  83 y.o. male, that presents for postop check after repair of 5.7 cm infrarenal abdominal aortic aneurysm on 07/29/2023 with aortobiiliac stent graft.  We used IVUS and landed the graft below the mural thrombus at his renal arteries. This did require cutdown on the right common femoral artery with primary repair.  Very pleased with his results and I have reviewed his CTA from 08/29/2023.  This shows good position of the stent graft with appropriate exclusion of the aneurysm.  Awaiting final read from radiology but I will plan to see him in 9 months with EVAR duplex.  Right groin is healed.  He has palpable pedal pulses.   Young Hensen, MD [facility] Office: [phone]

## 2023-09-07 ENCOUNTER — Ambulatory Visit: Admitting: Orthopedic Surgery

## 2023-09-07 DIAGNOSIS — M1711 Unilateral primary osteoarthritis, right knee: Secondary | ICD-10-CM

## 2023-09-07 MED ORDER — METHYLPREDNISOLONE ACETATE 40 MG/ML IJ SUSP
40.0000 mg | Freq: Once | INTRAMUSCULAR | Status: AC
  Administered 2023-09-07: 40 mg via INTRA_ARTICULAR

## 2023-09-07 NOTE — Patient Instructions (Signed)

## 2023-09-07 NOTE — Progress Notes (Signed)
 Return patient Visit  Assessment: [name] [name] is a 83 y.o. male with the following: 1. Arthritis of right knee  Plan: DELONTE MUSICH has advanced degenerative changes in the right knee.  He had done very well following his most recent injection until a couple weeks ago.  He started to experience pain.  He is interested in another steroid injection today.   Procedure note injection Right knee joint   Verbal consent was obtained to inject the right knee joint  Timeout was completed to confirm the site of injection.  The skin was prepped with alcohol and ethyl chloride was sprayed at the injection site.  A 21-gauge needle was used to inject 40 mg of Depo-Medrol  and 1% lidocaine  (3 cc) into the right knee using an anterolateral approach.  There were no complications. A sterile bandage was applied.   Follow-up: Return if symptoms worsen or fail to improve.  Subjective:  Chief Complaint  Patient presents with   Right knee pain    Injection 3 months ago was helpful.  Pain returned 2-3 weeks ago    History of Present Illness: [name] [name] is a 83 y.o. male who returns for evaluation of right knee pain.  He has chronic knee pain.  He has known arthritis.  He has had hyaluronic acid, as well as steroid injections.  Most recent injection was greater than 3 months ago.  This provided relief of his symptoms until a couple weeks ago.   Review of Systems: No fevers or chills No numbness or tingling No chest pain No shortness of breath No bowel or bladder dysfunction No GI distress No headaches   Objective: There were no vitals taken for this visit.  Physical Exam:  General: Elderly male., Alert and oriented., and No acute distress. Gait: Right sided antalgic gait.  Right knee with mild varus alignment overall.  Tenderness to palpation over the medial joint line.  No effusion.  Range of motion from 3-120 degrees without discomfort.  No increased laxity varus or valgus stress.   Negative Lachman.  Toes are warm and well-perfused.  IMAGING: No new imaging obtained today   New Medications:  Meds ordered this encounter  Medications   methylPREDNISolone  acetate (DEPO-MEDROL ) injection 40 mg      Tonita Frater, MD  09/07/2023 9:29 AM

## 2023-09-12 DIAGNOSIS — L570 Actinic keratosis: Secondary | ICD-10-CM | POA: Diagnosis not present

## 2023-09-12 DIAGNOSIS — C44311 Basal cell carcinoma of skin of nose: Secondary | ICD-10-CM | POA: Diagnosis not present

## 2023-09-12 DIAGNOSIS — X32XXXD Exposure to sunlight, subsequent encounter: Secondary | ICD-10-CM | POA: Diagnosis not present

## 2023-09-12 DIAGNOSIS — L308 Other specified dermatitis: Secondary | ICD-10-CM | POA: Diagnosis not present

## 2023-09-12 DIAGNOSIS — Z08 Encounter for follow-up examination after completed treatment for malignant neoplasm: Secondary | ICD-10-CM | POA: Diagnosis not present

## 2023-09-12 DIAGNOSIS — Z85820 Personal history of malignant melanoma of skin: Secondary | ICD-10-CM | POA: Diagnosis not present

## 2023-09-19 ENCOUNTER — Ambulatory Visit: Admitting: Family Medicine

## 2023-09-19 VITALS — BP 157/101 | HR 73 | Temp 98.4°F | Ht 73.0 in | Wt 202.0 lb

## 2023-09-19 DIAGNOSIS — I10 Essential (primary) hypertension: Secondary | ICD-10-CM

## 2023-09-19 DIAGNOSIS — R42 Dizziness and giddiness: Secondary | ICD-10-CM

## 2023-09-19 MED ORDER — MECLIZINE HCL 25 MG PO TABS
25.0000 mg | ORAL_TABLET | Freq: Three times a day (TID) | ORAL | 0 refills | Status: DC | PRN
Start: 2023-09-19 — End: 2023-10-27

## 2023-09-19 MED ORDER — LOSARTAN POTASSIUM 25 MG PO TABS: 25.0000 mg | ORAL_TABLET | Freq: Every day | ORAL | 3 refills | Status: DC

## 2023-09-19 NOTE — Patient Instructions (Signed)
 Medications as prescribed.  Follow up in 2 weeks.

## 2023-09-20 DIAGNOSIS — R42 Dizziness and giddiness: Secondary | ICD-10-CM | POA: Insufficient documentation

## 2023-09-20 NOTE — Assessment & Plan Note (Signed)
 Worsening, uncontrolled.  Adding losartan .

## 2023-09-20 NOTE — Assessment & Plan Note (Signed)
Meclizine as directed

## 2023-09-20 NOTE — Progress Notes (Signed)
 Subjective:  Patient ID: [name] [name], male    DOB: Jan 19, 1941  Age: 83 y.o. MRN: [id number]  CC:   Chief Complaint  Patient presents with   Hypertension    160 to 150 systolic at home    Dizziness    Balance is off when getting up or bending over     HPI:  83 year old male presents for evaluation of the above.  Patient states that he continues to not feel well.  Just reports fatigue and lack of energy.  Blood pressure has been elevated at home.  He states that he has been experiencing dizziness and feeling off balance.  Worse with abrupt movements.  Patient Active Problem List   Diagnosis Date Noted   Dizziness 09/20/2023   Anemia 08/30/2023   Thrush 08/30/2023   Cervical radiculopathy 08/15/2023   BPH associated with nocturia 05/24/2023   Chronic asthma, severe vs ACOS 05/05/2023   Celiac artery stenosis (HCC) 02/02/2023   Chronic idiopathic constipation 12/21/2022   Osteoarthritis of knees, bilateral 04/14/2022   Bilateral primary osteoarthritis of knee 04/14/2022   Irritable bowel syndrome 03/17/2022   Recurrent right lower quadrant abdominal pain 03/03/2022   Seasonal allergies 08/06/2021   COPD (chronic obstructive pulmonary disease) (HCC) 04/23/2021   CAD (coronary artery disease) 04/23/2021   Hypertension 09/16/2016   Lumbar spondylosis 11/05/2014   GERD (gastroesophageal reflux disease) 01/19/2012   Hyperlipidemia 04/15/2008   AAA (abdominal aortic aneurysm) (HCC) 04/15/2008    Social Hx   Social History   Socioeconomic History   Marital status: Widowed    Spouse name: Not on file   Number of children: Not on file   Years of education: Not on file   Highest education level: Not on file  Occupational History   Not on file  Tobacco Use   Smoking status: Former    Current packs/day: 0.00    Average packs/day: 1 pack/day for 35.0 years (35.0 ttl pk-yrs)    Types: Cigarettes    Start date: 03/27/1968    Quit date: 03/28/2003    Years since quitting:  20.4   Smokeless tobacco: Never  Vaping Use   Vaping status: Never Used  Substance and Sexual Activity   Alcohol use: No    Alcohol/week: 0.0 standard drinks of alcohol   Drug use: No   Sexual activity: Not Currently    Birth control/protection: None  Other Topics Concern   Not on file  Social History Narrative   Not on file   Social Drivers of Health   Financial Resource Strain: Low Risk  (03/18/2022)   Overall Financial Resource Strain (CARDIA)    Difficulty of Paying Living Expenses: Not very hard  Food Insecurity: No Food Insecurity (08/01/2023)   Hunger Vital Sign    Worried About Running Out of Food in the Last Year: Never true    Ran Out of Food in the Last Year: Never true  Transportation Needs: No Transportation Needs (08/01/2023)   PRAPARE - Administrator, Civil Service (Medical): No    Lack of Transportation (Non-Medical): No  Physical Activity: Insufficiently Active (03/18/2022)   Exercise Vital Sign    Days of Exercise per Week: 2 days    Minutes of Exercise per Session: 20 min  Stress: No Stress Concern Present (03/18/2022)   [employer] of Occupational Health - Occupational Stress Questionnaire    Feeling of Stress : Not at all  Social Connections: Socially Isolated (07/30/2023)   Social Connection and Isolation  Panel [NHANES]    Frequency of Communication with Friends and Family: More than three times a week    Frequency of Social Gatherings with Friends and Family: Three times a week    Attends Religious Services: Never    Active Member of Clubs or Organizations: No    Attends Banker Meetings: Never    Marital Status: Widowed    Review of Systems Per HPI  Objective:  BP (!) 157/101   Pulse 73   Temp 98.4 F (36.9 C)   Ht 6\' 1"  (1.854 m)   Wt 202 lb (91.6 kg)   SpO2 96%   BMI 26.65 kg/m      09/19/2023    4:42 PM 09/19/2023    4:24 PM 09/06/2023    2:20 PM  BP/Weight  Systolic BP 157 165 156  Diastolic BP 101 97  101  Wt. (Lbs)  202 207  BMI  26.65 kg/m2 27.31 kg/m2    Physical Exam Vitals and nursing note reviewed.  Constitutional:      General: He is not in acute distress.    Appearance: Normal appearance.  HENT:     Head: Normocephalic and atraumatic.  Eyes:     General:        Right eye: No discharge.        Left eye: No discharge.     Conjunctiva/sclera: Conjunctivae normal.  Cardiovascular:     Rate and Rhythm: Normal rate and regular rhythm.  Pulmonary:     Effort: Pulmonary effort is normal. No respiratory distress.  Neurological:     Mental Status: He is alert.     Lab Results  Component Value Date   WBC 7.2 08/30/2023   HGB 12.9 (L) 08/30/2023   HCT 40.1 08/30/2023   PLT 165 08/30/2023   GLUCOSE 95 08/30/2023   CHOL 134 08/30/2023   TRIG 115 08/30/2023   HDL 45 08/30/2023   LDLCALC 68 08/30/2023   ALT 12 08/30/2023   AST 16 08/30/2023   NA 141 08/30/2023   K 4.5 08/30/2023   CL 101 08/30/2023   CREATININE 0.79 08/30/2023   BUN 11 08/30/2023   CO2 25 08/30/2023   TSH 0.961 08/30/2023   PSA 1.80 07/26/2014   INR 1.1 07/29/2023   HGBA1C 6.0 (H) 08/30/2023     Assessment & Plan:  Primary hypertension Assessment & Plan: Worsening, uncontrolled.  Adding losartan .  Orders: -     Losartan  Potassium; Take 1 tablet (25 mg total) by mouth daily.  Dispense: 90 tablet; Refill: 3  Dizziness Assessment & Plan: Meclizine  as directed.   Orders: -     Meclizine  HCl; Take 1 tablet (25 mg total) by mouth 3 (three) times daily as needed for dizziness.  Dispense: 30 tablet; Refill: 0    Follow-up:  2 weeks.  Kathleen Papa DO [facility]

## 2023-09-21 DIAGNOSIS — H5022 Vertical strabismus, left eye: Secondary | ICD-10-CM | POA: Diagnosis not present

## 2023-09-21 DIAGNOSIS — H5000 Unspecified esotropia: Secondary | ICD-10-CM | POA: Diagnosis not present

## 2023-09-21 DIAGNOSIS — H02132 Senile ectropion of right lower eyelid: Secondary | ICD-10-CM | POA: Diagnosis not present

## 2023-09-21 DIAGNOSIS — H401122 Primary open-angle glaucoma, left eye, moderate stage: Secondary | ICD-10-CM | POA: Diagnosis not present

## 2023-09-21 DIAGNOSIS — H04123 Dry eye syndrome of bilateral lacrimal glands: Secondary | ICD-10-CM | POA: Diagnosis not present

## 2023-09-21 DIAGNOSIS — H532 Diplopia: Secondary | ICD-10-CM | POA: Diagnosis not present

## 2023-09-21 DIAGNOSIS — H01135 Eczematous dermatitis of left lower eyelid: Secondary | ICD-10-CM | POA: Diagnosis not present

## 2023-09-21 DIAGNOSIS — H1045 Other chronic allergic conjunctivitis: Secondary | ICD-10-CM | POA: Diagnosis not present

## 2023-09-21 NOTE — Progress Notes (Signed)
 [name] [name], male    DOB: [birth year]    MRN: [id number]   Brief patient profile:  30  yowm  quit smoking 2004  with hx  bad asthma as child former [place] pt self-referred  to pulmonary clinic in [place]  05/05/2023  for copd  with GOLD 3 (barely) criteria but nl DLCO   11/05/19      History of Present Illness  05/05/2023  Pulmonary/ 1st office eval/ [place] Hailey / [place] Office  advair  240 Chief Complaint  Patient presents with   Establish Care    Switch from [facility]   Shortness of Breath  Dyspnea:  pushing mower x / walks a bit slower than others Cough: dark mucus x one day /also hoarse  Sleep: bed is flat and 2 pillows s resp cc  SABA hfa use: 2-3 x per week but neb twice daily as maint  02 use:none  Rec If mucus stays nasty > Take Zpak (refillable)  Stop Advair   Plan A = Automatic = Always=    Breyna  160  (or breztri  x one) x 2 puffs 1st thing in am and 12 hours later  Work on inhaler technique:  >>>  Remember how golfers warm up by taking practice swings - do this with an empty inhaler  Plan B = Backup (to supplement plan A, not to replace it) Only use your albuterol  inhaler as a rescue medication  Plan C = Crisis (instead of Plan B but only if Plan B stops working) - only use your albuterol  nebulizer if you first try Plan B    08/15/2023  3 m f/u ov/[place] office/Danyia Borunda re: copd / AB  maint on advair    Dyspnea:  improving since surgery (AAA 07/29/23) Cough: congestion esp in am felt worse on breztri  so back to advair  hfa 240 but min mucoid sputum production Sleeping: does fine flat 2 pillows s    resp cc  SABA use: still on neb twice daily  Rec Plan A = Automatic = Always=    new nebulizer solution first thing in am and 12 hours later   = formoterol  and budesonide  combined together is same nebulizer cup Plan B = Backup (to supplement plan A, not to replace it) Only use your albuterol  inhaler as a rescue medication  Plan C = Crisis (instead of Plan B but only  if Plan B stops working) - only use your albuterol  nebulizer if you first try Plan B  Please schedule a follow up office visit in 6 weeks, call sooner if needed with all medications /inhalers/ solutions in hand    09/22/2023  f/u ov/[place] office/[place] Soliman re: GOLD 3 copd/AB maint on performist /bud  - says albuterol  needed w/in a few hours of am performist to control sob that is related to minimal or no exertion  Chief Complaint  Patient presents with   Asthma  Dyspnea:  mb is x 75 ft flat and usually sob when gets back to house but doesn't have to stop  Cough: minimal / non productive  Sleeping: flat surface 2 pillows   resp cc  SABA use: still needs saba neb sev hours p performist  02: none    No obvious day to day or daytime variability or assoc excess/ purulent sputum or mucus plugs or hemoptysis or cp or chest tightness, subjective wheeze or overt  hb symptoms.    Also denies any obvious fluctuation of symptoms with weather or environmental changes or other aggravating or alleviating factors  except as outlined above   No unusual exposure hx or h/o childhood pna or knowledge of premature birth.  Current Allergies, Complete Past Medical History, Past Surgical History, Family History, and Social History were reviewed in [employer] record.  ROS  The following are not active complaints unless bolded Hoarseness, sore throat, dysphagia, dental problems, itching, sneezing,  nasal congestion or discharge of excess mucus or purulent secretions, ear ache,   fever, chills, sweats, unintended wt loss or wt gain, classically pleuritic or exertional cp,  orthopnea pnd or arm/hand swelling  or leg swelling, presyncope, palpitations, abdominal pain, anorexia, nausea, vomiting, diarrhea  or change in bowel habits or change in bladder habits, change in stools or change in urine, dysuria, hematuria,  rash, arthralgias, visual complaints, headache, numbness, weakness or ataxia or  problems with walking or coordination,  change in mood or  memory.        Current Meds  Medication Sig   albuterol  (PROVENTIL ) (2.5 MG/3ML) 0.083% nebulizer solution Take 3 mLs (2.5 mg total) by nebulization every 6 (six) hours as needed for wheezing or shortness of breath.   albuterol  (VENTOLIN  HFA) 108 (90 Base) MCG/ACT inhaler Inhale 2 puffs into the lungs every 4 (four) hours as needed for wheezing or shortness of breath.   alfuzosin  (UROXATRAL ) 10 MG 24 hr tablet Take 1 tablet (10 mg total) by mouth at bedtime.   aspirin  EC 81 MG tablet Take 1 tablet (81 mg total) by mouth daily at 6 (six) AM. Swallow whole.   budesonide  (PULMICORT ) 0.25 MG/2ML nebulizer solution Use twice daily with performist   cetirizine (ZYRTEC) 10 MG tablet Take 10 mg by mouth daily.   clotrimazole  (LOTRIMIN ) 1 % cream Apply to affected area 2 times daily (Patient taking differently: Apply 1 Application topically 2 (two) times daily as needed (itching).)   COMBIGAN  0.2-0.5 % ophthalmic solution Apply 1 drop to eye 2 (two) times daily.   esomeprazole  (NEXIUM ) 20 MG capsule Take 40 mg by mouth daily.   finasteride  (PROSCAR ) 5 MG tablet Take 1 tablet (5 mg total) by mouth daily.   fluticasone -salmeterol (ADVAIR  HFA) 230-21 MCG/ACT inhaler Inhale 2 puffs into the lungs 2 (two) times daily.   formoterol  (PERFOROMIST ) 20 MCG/2ML nebulizer solution Take 2 mLs (20 mcg total) by nebulization 2 (two) times daily. Use in nebulizer twice daily perfectly regularly   LORazepam  (ATIVAN ) 0.5 MG tablet Take 1 tablet (0.5 mg total) by mouth at bedtime as needed.   losartan  (COZAAR ) 25 MG tablet Take 1 tablet (25 mg total) by mouth daily.   lovastatin  (MEVACOR ) 40 MG tablet Take 1 tablet (40 mg total) by mouth daily.   meclizine  (ANTIVERT ) 25 MG tablet Take 1 tablet (25 mg total) by mouth 3 (three) times daily as needed for dizziness.   metroNIDAZOLE  (METROGEL ) 0.75 % gel Apply 1 Application topically daily as needed (rosacea).    oxymetazoline  (AFRIN) 0.05 % nasal spray Place 1 spray into both nostrils 2 (two) times daily as needed for congestion.   Spacer/Aero-Holding Ismael Maria Use as directed   triamcinolone  cream (KENALOG ) 0.1 % Apply 1 Application topically 2 (two) times daily as needed (itching).            Past Medical History:  Diagnosis Date   AAA (abdominal aortic aneurysm) (HCC)    needs yearly ultrasound   Allergy    Anemia    Arthritis    Asthma    BCC (basal cell carcinoma) 08/18/1989   left  shoulder blad, upper right arm, left arm beyond elbow, c&d   BCC (basal cell carcinoma) 01/31/1992   Posterior neck, curetx3, 23fu   BCC (basal cell carcinoma) 11/22/2001   mid forehead, cx3, excision, right forearm, cx3, 15fu   BCC (basal cell carcinoma) 10/09/2003   mid forehead, MOHs   BCC (basal cell carcinoma) 08/15/2008   upper left back, biopsy   BPH (benign prostatic hyperplasia)    CAD (coronary artery disease)    Cancer (HCC)    skin cancer   COPD (chronic obstructive pulmonary disease) (HCC)    Dysrhythmia    pt. states it can be fast at times   GERD (gastroesophageal reflux disease)    Glaucoma    History of acute pancreatitis 12/21/2022   HOH (hard of hearing)    Hypercholesterolemia    Hypertension    Impaired fasting glucose    Low back pain    Melanoma in situ (HCC) 10/09/2003   left chin, MOHs   MI (myocardial infarction) (HCC) 1999   SCC (squamous cell carcinoma) 07/03/2014   in situ, behind left ear, cx3, cautery, 31fu   SCC (squamous cell carcinoma) 07/03/2014   well diff, left forearm, biopsy, cx1, cautery   SCC (squamous cell carcinoma) 07/20/2017   in situ, left upper arm, cx3, 59fu   SCC (squamous cell carcinoma) 01/10/2019   in situ, left post shoulder, cx3, 63fu   SCC (squamous cell carcinoma) 11/22/2001   left forearm distal, left forearm, cx3, 46fu   SCC (squamous cell carcinoma) 10/09/2003   [place], left ear post, clear per st, right cheek clear   SCC (squamous  cell carcinoma) 03/30/2004   in situ, left upper arm, cx3, 49fu   SCC (squamous cell carcinoma) 03/08/2005   in situ, right cheek, mid upper forehead, cx3, 88fu   SCC (squamous cell carcinoma) 06/08/2006   in situ, left shoulder, cx3, 34fu   SCC (squamous cell carcinoma) 05/05/2010   right inner wrist, biopsy   SCC (squamous cell carcinoma) 09/13/2013   in situ, right crown scalp, front scalp, biopsy   Thrush       Objective:    Wts  09/22/2023         202  08/15/2023       207   07/29/23 205 lb (93 kg)  07/22/23 207 lb 8 oz (94.1 kg)  07/21/23 210 lb 3.2 oz (95.3 kg)      Vital signs reviewed  09/22/2023  - Note at rest 02 sats  92% on RA   General appearance:    somber amb wm/ classic voice fatigue   HEENT : Oropharynx  clear   Nasal turbinates nl    NECK :  without  apparent JVD/ palpable Nodes/TM    LUNGS: no acc muscle use,  Mild barrel  contour chest wall with bilateral  Distant bs s audible wheeze and  without cough on insp or exp maneuvers  and mild  Hyperresonant  to  percussion bilaterally     CV:  RRR  no s3 or murmur or increase in P2, and no edema   ABD:  soft and nontender with pos end  insp Hoover's  in the supine position.  No bruits or organomegaly appreciated   MS:  Nl gait/ ext warm without deformities Or obvious joint restrictions  calf tenderness, cyanosis or clubbing     SKIN: warm and dry without lesions    NEURO:  alert, approp, nl sensorium with  no motor or cerebellar  deficits apparent.      Assessment

## 2023-09-22 ENCOUNTER — Ambulatory Visit: Admitting: Internal Medicine

## 2023-09-22 ENCOUNTER — Encounter: Payer: Self-pay | Admitting: Internal Medicine

## 2023-09-22 VITALS — BP 149/80 | HR 90 | Ht 73.0 in | Wt 202.4 lb

## 2023-09-22 DIAGNOSIS — J449 Chronic obstructive pulmonary disease, unspecified: Secondary | ICD-10-CM | POA: Diagnosis not present

## 2023-09-22 MED ORDER — FAMOTIDINE 20 MG PO TABS: ORAL_TABLET | ORAL | 11 refills | Status: DC

## 2023-09-22 MED ORDER — IPRATROPIUM-ALBUTEROL 0.5-2.5 (3) MG/3ML IN SOLN: 3.0000 mL | Freq: Four times a day (QID) | RESPIRATORY_TRACT | 11 refills | Status: DC

## 2023-09-22 MED ORDER — ESOMEPRAZOLE MAGNESIUM 40 MG PO CPDR: DELAYED_RELEASE_CAPSULE | ORAL | 11 refills | Status: AC

## 2023-09-22 NOTE — Assessment & Plan Note (Addendum)
 Quit smoking 2024/ Alpha one level 138  -  asthma onset as child  - PFT's  11/05/19  FEV1 1.72 (47 % ) ratio 0.61  p 8 % improvement from saba p saba  prior to study with DLCO  32.71 (114 %)   and FV curve classically concave    - 05/05/2023  After extensive coaching inhaler device,  effectiveness =    75% vs baseline 25 % try d/c advair  (hoaresness) and breyna  160 2bid (trained on breztri  to use one bid sample but could not tol) - 08/15/2023 changed to neb formoterol  and bud 0.25 bid - 09/22/2023   Walked on RA  x  3  lap(s) =  approx 450  ft  @ mod pace, stopped due to end of study with lowest 02 sats 93% and no sob   09/22/2023   still requiring freq daytime saba neb so rec duoneb qid and bud 0.25 mg bid plus gerd rx x 4 weeks then return  to re eval doe to mailbox and perceived need for extra saba   Comment  Symptoms are  disproportionate to objective findings and not clear to what extent this is actually a pulmonary  problem but pt does appear to have difficult to sort out respiratory symptoms of unknown origin for which  DDX  = almost all start with A and  include Adherence, Ace Inhibitors, Acid Reflux, Active Sinus Disease, Alpha 1 Antitripsin deficiency, Anxiety/depression/ deconditioning  masquerading as Airways dz,  ABPA,  Allergy(esp in young), Aspiration (esp in elderly), Adverse effects of meds,  Active smoking or Vaping, A bunch of PE's/clot burden (a few small clots can't cause this syndrome unless there is already severe underlying pulm or vascular dz with poor reserve),  Anemia or thyroid  disorder, plus two Bs  = Bronchiectasis and Beta blocker use..and one C= CHF   BOLDED dxs of greatest concern here    Lab Results  Component Value Date   HGB 12.9 (L) 08/30/2023   HGB 11.8 (L) 07/30/2023   HGB 12.1 (L) 07/29/2023   HGB 13.8 07/22/2023   HGB 13.4 09/05/2020   HGB 14.8 02/14/2019     Lab Results  Component Value Date   TSH 0.961 08/30/2023     The part of his hx that it's  difficult to explain is why he still persceives he needs so much saba despite starting laba which he doesn't perceive as helping so rec change to duoneb qid for now with budesonide  0.25 and return in 4 weeks for recheck.  Add gerd rx empirically noting voice fatigue on exam may be a sign of it.   Chart review indicates he's not tried LAMA yet so stiolto might be a good choice as has used combivent (same device) in past.  Each maintenance medication was reviewed in detail including emphasizing most importantly the difference between maintenance and prns and under what circumstances the prns are to be triggered using an action plan format where appropriate.  Total time for H and P, chart review, counseling, reviewing neb device(s) , directly observing portions of ambulatory 02 saturation study/ and generating customized AVS unique to this office visit / same day charting = 45 m for chronic   refractory respiratory  symptoms of uncertain etiology

## 2023-09-22 NOTE — Patient Instructions (Addendum)
 Duoneb /budesonide  first thing in am and 12 hours later (combine it in the nebulizer)  Duoneb should be used around lunch time and bedtime by itself (without the budesonide )   Be sure you take you time you walks w/in a few hours of the nebulizer   Change nexium to 40 mg Take 30-60 min before first meal of the day and pepcid 20 mg after supper  GERD (REFLUX)  is an extremely common cause of respiratory symptoms just like yours , many times with no obvious heartburn at all.    It can be treated with medication, but also with lifestyle changes including elevation of the head of your bed (ideally with 6 -8inch blocks under the headboard of your bed),  Smoking cessation, avoidance of late meals, excessive alcohol, and avoid fatty foods, chocolate, peppermint, colas, red wine, and acidic juices such as orange juice.  NO MINT OR MENTHOL  PRODUCTS SO NO COUGH DROPS  USE SUGARLESS CANDY INSTEAD (Jolley ranchers or Stover's or Life Savers) or even ice chips will also do - the key is to swallow to prevent all throat clearing. NO OIL BASED VITAMINS - use powdered substitutes.  Avoid fish oil  when coughing.    Please schedule a follow up office visit in 4 weeks, sooner if needed

## 2023-09-23 ENCOUNTER — Telehealth: Payer: Self-pay

## 2023-09-23 NOTE — Telephone Encounter (Signed)
 The information received does not support approval under your Medicare Part D benefit; however, coverage or payment may be available under your Part A or Part B benefit. The Social Security Act states that a drug prescribed to a Part D eligible individual cannot be considered a covered Part D drug if payment for the drug is or would be available under Medicare Part A or Part B. Medicare Part B covers the requested drug when used in the home, or a home-type setting, with a nebulizer. Because the information we have shows that you will be using the requested drug in a nebulizer in your home, it cannot be covered under your Medicare Part D benefit. This request was denied under your Medicare Part D benefit; however, it may be covered under Medicare Part A or Part B. For more information, talk to your prescriber or call 1-800-MEDICARE. We hope this letter helps you understand why we denied your request.

## 2023-09-23 NOTE — Telephone Encounter (Signed)
*  Pulm  Pharmacy Patient Advocate Encounter   Received notification from CoverMyMeds that prior authorization for Ipratropium-Albuterol  0.5-2.5 (3)MG/3ML solution  is required/requested.   Insurance verification completed.   The patient is insured through [employer] .   Per test claim: PA required; PA submitted to above mentioned insurance via CoverMyMeds Key/confirmation #/EOC [id number] Status is pending

## 2023-09-28 ENCOUNTER — Other Ambulatory Visit: Payer: Self-pay

## 2023-09-28 ENCOUNTER — Telehealth: Payer: Self-pay

## 2023-09-28 MED ORDER — IPRATROPIUM-ALBUTEROL 0.5-2.5 (3) MG/3ML IN SOLN: 3.0000 mL | Freq: Four times a day (QID) | RESPIRATORY_TRACT | 11 refills | Status: DC

## 2023-09-28 NOTE — Telephone Encounter (Signed)
 Copied from CRM [id number]. Topic: Clinical - Medication Question >> Sep 28, 2023  9:35 AM [name] [name] B wrote: Reason for CRM: Patient called and has questions regarding most recent prescription. States he needs to know if it is going to be a permanent prescription. He has questions.   I called and spoke to pt. Pt was asking about his Ipratropium neb. Pt states his insurance rejected it by [employer]. Pt was okay to try a different pharmacy. Pt would like to try [facility]. I will send the neb RX to the preferred pharmacy. Pt verbalized understanding. NFN

## 2023-09-29 ENCOUNTER — Encounter: Payer: Self-pay | Admitting: Family Medicine

## 2023-09-29 ENCOUNTER — Ambulatory Visit: Admitting: Family Medicine

## 2023-09-29 VITALS — BP 132/82 | HR 72 | Temp 98.4°F | Ht 73.0 in | Wt 204.0 lb

## 2023-09-29 DIAGNOSIS — R42 Dizziness and giddiness: Secondary | ICD-10-CM

## 2023-09-29 DIAGNOSIS — I10 Essential (primary) hypertension: Secondary | ICD-10-CM

## 2023-09-29 DIAGNOSIS — G4700 Insomnia, unspecified: Secondary | ICD-10-CM | POA: Diagnosis not present

## 2023-09-29 MED ORDER — LORAZEPAM 0.5 MG PO TABS: 0.5000 mg | ORAL_TABLET | Freq: Every evening | ORAL | 3 refills | Status: DC | PRN

## 2023-09-29 NOTE — Patient Instructions (Signed)
 I reached out to [facility].  Referral placed.  Follow up in 3 months.

## 2023-09-29 NOTE — Assessment & Plan Note (Signed)
 Improved on Losartan . Continue.

## 2023-09-29 NOTE — Assessment & Plan Note (Addendum)
 Persistent. Likely multifactorial. Referring for Vestibular rehab. Discussed change from Alfuzosin  to Flomax . Patient wants me to reach out to Urology first.

## 2023-09-29 NOTE — Progress Notes (Signed)
 Subjective:  Patient ID: [name] [name], male    DOB: 1940/11/08  Age: 83 y.o. MRN: [id number]  CC:   Chief Complaint  Patient presents with   Follow-up    1 month f/u hypertension  and dizziness     HPI:  83 year old male presents for follow up.  BP improved after addition of Losartan .  He continues to have dizziness. Associated gait disturbance. Has continued fatigue as well. Also reports dry mouth.  Patient Active Problem List   Diagnosis Date Noted   Dizziness 09/20/2023   Anemia 08/30/2023   Cervical radiculopathy 08/15/2023   BPH associated with nocturia 05/24/2023   COPD GOLD 3/ AB 05/05/2023   Celiac artery stenosis (HCC) 02/02/2023   Chronic idiopathic constipation 12/21/2022   Osteoarthritis of knees, bilateral 04/14/2022   Bilateral primary osteoarthritis of knee 04/14/2022   Irritable bowel syndrome 03/17/2022   Recurrent right lower quadrant abdominal pain 03/03/2022   Seasonal allergies 08/06/2021   CAD (coronary artery disease) 04/23/2021   Hypertension 09/16/2016   Lumbar spondylosis 11/05/2014   GERD (gastroesophageal reflux disease) 01/19/2012   Hyperlipidemia 04/15/2008   AAA (abdominal aortic aneurysm) (HCC) 04/15/2008    Social Hx   Social History   Socioeconomic History   Marital status: Widowed    Spouse name: Not on file   Number of children: Not on file   Years of education: Not on file   Highest education level: Not on file  Occupational History   Not on file  Tobacco Use   Smoking status: Former    Current packs/day: 0.00    Average packs/day: 1 pack/day for 35.0 years (35.0 ttl pk-yrs)    Types: Cigarettes    Start date: 03/27/1968    Quit date: 03/28/2003    Years since quitting: 20.5   Smokeless tobacco: Never  Vaping Use   Vaping status: Never Used  Substance and Sexual Activity   Alcohol use: No    Alcohol/week: 0.0 standard drinks of alcohol   Drug use: No   Sexual activity: Not Currently    Birth control/protection:  None  Other Topics Concern   Not on file  Social History Narrative   Not on file   Social Drivers of Health   Financial Resource Strain: Low Risk  (03/18/2022)   Overall Financial Resource Strain (CARDIA)    Difficulty of Paying Living Expenses: Not very hard  Food Insecurity: No Food Insecurity (08/01/2023)   Hunger Vital Sign    Worried About Running Out of Food in the Last Year: Never true    Ran Out of Food in the Last Year: Never true  Transportation Needs: No Transportation Needs (08/01/2023)   PRAPARE - Administrator, Civil Service (Medical): No    Lack of Transportation (Non-Medical): No  Physical Activity: Insufficiently Active (03/18/2022)   Exercise Vital Sign    Days of Exercise per Week: 2 days    Minutes of Exercise per Session: 20 min  Stress: No Stress Concern Present (03/18/2022)   [employer] of Occupational Health - Occupational Stress Questionnaire    Feeling of Stress : Not at all  Social Connections: Socially Isolated (07/30/2023)   Social Connection and Isolation Panel [NHANES]    Frequency of Communication with Friends and Family: More than three times a week    Frequency of Social Gatherings with Friends and Family: Three times a week    Attends Religious Services: Never    Active Member of Clubs or  Organizations: No    Attends Banker Meetings: Never    Marital Status: Widowed    Review of Systems Per HPI  Objective:  BP 132/82   Pulse 72   Temp 98.4 F (36.9 C)   Ht 6\' 1"  (1.854 m)   Wt 204 lb (92.5 kg)   SpO2 98%   BMI 26.91 kg/m      09/29/2023   10:14 AM 09/22/2023   11:33 AM 09/19/2023    4:42 PM  BP/Weight  Systolic BP 132 149 157  Diastolic BP 82 80 101  Wt. (Lbs) 204 202.4   BMI 26.91 kg/m2 26.7 kg/m2     Physical Exam Vitals and nursing note reviewed.  Constitutional:      General: He is not in acute distress.    Appearance: Normal appearance.  HENT:     Head: Normocephalic and atraumatic.   Cardiovascular:     Rate and Rhythm: Normal rate and regular rhythm.  Pulmonary:     Effort: Pulmonary effort is normal.     Breath sounds: No wheezing.  Neurological:     Mental Status: He is alert.     Lab Results  Component Value Date   WBC 7.2 08/30/2023   HGB 12.9 (L) 08/30/2023   HCT 40.1 08/30/2023   PLT 165 08/30/2023   GLUCOSE 95 08/30/2023   CHOL 134 08/30/2023   TRIG 115 08/30/2023   HDL 45 08/30/2023   LDLCALC 68 08/30/2023   ALT 12 08/30/2023   AST 16 08/30/2023   NA 141 08/30/2023   K 4.5 08/30/2023   CL 101 08/30/2023   CREATININE 0.79 08/30/2023   BUN 11 08/30/2023   CO2 25 08/30/2023   TSH 0.961 08/30/2023   PSA 1.80 07/26/2014   INR 1.1 07/29/2023   HGBA1C 6.0 (H) 08/30/2023     Assessment & Plan:  Primary hypertension Assessment & Plan: Improved on Losartan . Continue.    Insomnia, unspecified type -     LORazepam ; Take 1 tablet (0.5 mg total) by mouth at bedtime as needed.  Dispense: 30 tablet; Refill: 3  Dizziness Assessment & Plan: Persistent. Likely multifactorial. Referring for Vestibular rehab. Discussed change from Alfuzosin  to Flomax . Patient wants me to reach out to Urology first.   Orders: -     Ambulatory referral to Physical Therapy    Follow-up:  3 months  Zanylah Hardie Debrah Fan DO [facility]

## 2023-09-30 ENCOUNTER — Other Ambulatory Visit: Payer: Self-pay | Admitting: Family Medicine

## 2023-10-03 ENCOUNTER — Other Ambulatory Visit: Payer: Self-pay | Admitting: Family Medicine

## 2023-10-03 ENCOUNTER — Telehealth: Payer: Self-pay

## 2023-10-03 ENCOUNTER — Ambulatory Visit: Admitting: Family Medicine

## 2023-10-03 DIAGNOSIS — I129 Hypertensive chronic kidney disease with stage 1 through stage 4 chronic kidney disease, or unspecified chronic kidney disease: Secondary | ICD-10-CM | POA: Diagnosis not present

## 2023-10-03 DIAGNOSIS — N182 Chronic kidney disease, stage 2 (mild): Secondary | ICD-10-CM | POA: Diagnosis not present

## 2023-10-03 DIAGNOSIS — J449 Chronic obstructive pulmonary disease, unspecified: Secondary | ICD-10-CM | POA: Diagnosis not present

## 2023-10-03 DIAGNOSIS — E785 Hyperlipidemia, unspecified: Secondary | ICD-10-CM | POA: Diagnosis not present

## 2023-10-03 DIAGNOSIS — R809 Proteinuria, unspecified: Secondary | ICD-10-CM | POA: Diagnosis not present

## 2023-10-03 DIAGNOSIS — I2510 Atherosclerotic heart disease of native coronary artery without angina pectoris: Secondary | ICD-10-CM | POA: Diagnosis not present

## 2023-10-03 DIAGNOSIS — I714 Abdominal aortic aneurysm, without rupture, unspecified: Secondary | ICD-10-CM | POA: Diagnosis not present

## 2023-10-03 LAB — LAB REPORT - SCANNED
Alb/Creat Ratio, Ur: 5 mg/g{creat}
Albumin, Urine POC: <3
Creatinine, POC: 66.2 mg/dL

## 2023-10-03 MED ORDER — TAMSULOSIN HCL 0.4 MG PO CAPS: 0.4000 mg | ORAL_CAPSULE | Freq: Every day | ORAL | 3 refills | Status: DC

## 2023-10-03 NOTE — Telephone Encounter (Signed)
 Communication  Reason for CRM: Pt was supposed to be put on Flomax , was told by pharmacy that they never received a prescription.            [address] - [place], [place] - [address]    [address] [place] [address]    Phone: [phone] Fax: [phone]

## 2023-10-04 ENCOUNTER — Telehealth: Payer: Self-pay | Admitting: *Deleted

## 2023-10-04 ENCOUNTER — Other Ambulatory Visit: Payer: Self-pay

## 2023-10-04 DIAGNOSIS — E785 Hyperlipidemia, unspecified: Secondary | ICD-10-CM

## 2023-10-04 MED ORDER — LOVASTATIN 40 MG PO TABS
40.0000 mg | ORAL_TABLET | Freq: Every day | ORAL | 0 refills | Status: DC
Start: 2023-10-04 — End: 2023-12-30

## 2023-10-04 NOTE — Telephone Encounter (Signed)
 Copied from CRM [id number]. Topic: Clinical - Prescription Issue >> Oct 04, 2023 10:07 AM Justina Oman C wrote: Reason for CRM: Patient [phone] states is confused and needs instruction on taking medications: budesonide  (PULMICORT ) 0.25 MG/2ML nebulizer solution, and albuterol  (PROVENTIL ) (2.5 MG/3ML) 0.083% nebulizer solution. Also, patient states there's medication that was not done correctly to [facility] pharmacy for four boxes and only received one box today. Patient does not want to provide anymore information/details, wants to speak with the Dr. Jacqui Mau nurse directly. Unable to reach CAL. Please advise and call back.  I called and spoke with the pt and went over instructions regarding nebs- he verbalized understanding and repeated back to me   Duoneb /budesonide  first thing in am and 12 hours later (combine it in the nebulizer)   Duoneb should be used around lunch time and bedtime by itself (without the budesonide )      He states that he only received a 7 day supply of duoneb from [facility] I called [facility], spoke with Lizzy and was advised they only sent this amount due to insurance- bc they thought he was still on perforromist  I advised that the perforomist  was d/c'ed  She will mail the rest of the duoneb to the pt  Pt aware  Nothing further needed

## 2023-10-06 DIAGNOSIS — H01134 Eczematous dermatitis of left upper eyelid: Secondary | ICD-10-CM | POA: Diagnosis not present

## 2023-10-06 DIAGNOSIS — H10502 Unspecified blepharoconjunctivitis, left eye: Secondary | ICD-10-CM | POA: Diagnosis not present

## 2023-10-06 DIAGNOSIS — H04123 Dry eye syndrome of bilateral lacrimal glands: Secondary | ICD-10-CM | POA: Diagnosis not present

## 2023-10-16 NOTE — Progress Notes (Signed)
 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 pt self-referred  to pulmonary clinic in [place]  05/05/2023  for copd  with GOLD 3 (barely) criteria but nl DLCO   11/05/19 and L HD eventration (ant portion) noted on 1st lateral view in 2011 with wt at 1st pulmonary eval in 07/2019 = 201 (baseline since 2012)    Onset in the 1980s  cough better p quit smoking    History of Present Illness  05/05/2023  Pulmonary/ 1st office eval/ [name] [name] / [place] [facility]  advair  240 Chief Complaint  Patient presents with   Establish Care    Switch from [facility]   Shortness of Breath  Dyspnea:  pushing mower x / walks a bit slower than others Cough: dark mucus x one day /also hoarse  Sleep: bed is flat and 2 pillows s resp cc  SABA hfa use: 2-3 x per week but neb twice daily as maint  02 use:none  Rec If mucus stays nasty > Take Zpak (refillable)  Stop Advair   Plan A = Automatic = Always=    Breyna  160  (or breztri  x one) x 2 puffs 1st thing in am and 12 hours later  Work on inhaler technique:  >>>  Remember how golfers warm up by taking practice swings - do this with an empty inhaler  Plan B = Backup (to supplement plan A, not to replace it) Only use your albuterol  inhaler as a rescue medication  Plan C = Crisis (instead of Plan B but only if Plan B stops working) - only use your albuterol  nebulizer if you first try Plan B    08/15/2023  3 m f/u ov/[place] office/[name] [name] re: copd / AB  maint on advair    Dyspnea:  improving since surgery (AAA 07/29/23) Cough: congestion esp in am felt worse on breztri  so back to advair  hfa 240 but min mucoid sputum production Sleeping: does fine flat 2 pillows s    resp cc  SABA use: still on neb twice daily  Rec Plan A = Automatic = Always=    new nebulizer solution first thing in am and 12 hours later   = formoterol   and budesonide  combined together is same nebulizer cup Plan B = Backup (to supplement plan A, not to replace it) Only use your albuterol  inhaler as a rescue medication  Plan C = Crisis (instead of Plan B but only if Plan B stops working) - only use your albuterol  nebulizer if you first try Plan B  Please schedule a follow up office visit in 6 weeks, call sooner if needed with all medications /inhalers/ solutions in hand    09/22/2023  f/u ov/[place] office/[name] [name] re: GOLD 3 copd/AB maint on performist /bud  - says albuterol  needed w/in a few hours of am performist to control sob that is related to minimal or no exertion  Chief Complaint  Patient presents with   Asthma  Dyspnea:  mb is x 75 ft flat and usually sob when gets back to house but doesn't have to stop  Cough: minimal / non productive  Sleeping: flat surface 2 pillows   resp cc  SABA use: still needs saba neb sev hours p performist  02: none  Rec Duoneb /budesonide  first thing in am and 12 hours later (combine  it in the nebulizer) Duoneb should be used around lunch time and bedtime by itself (without the budesonide )  Be sure you take you time you walks w/in a few hours of the nebulizer  Change nexium  to 40 mg Take 30-60 min before first meal of the day and pepcid  20 mg after supper GERD diet reviewed, bed blocks rec  Please schedule a follow up office visit in 4 weeks, sooner if needed      10/20/2023  f/u ov/ office/[name] [name] re: GOLD 3 copd/AB maint on duoneb /budesonide  "can't tell it's helping"  Chief Complaint  Patient presents with   Follow-up   COPD   Dyspnea:  mb and back is about the same / struggles uphill from the shed  Cough: less production - feels advair  did better helping him with his "congestion" and cough stuff up = white thin mucus  Sleeping: flat bed 3 pillows s   resp cc  SABA use: couple times a week on top of duonbeb "seems to help"  Really not pre or rechallenging though  02: none  Using mints  for dry mouth    No obvious day to day or daytime variability or assoc   purulent sputum or mucus plugs or hemoptysis or cp or chest tightness, subjective wheeze or overt sinus or hb symptoms.    Also denies any obvious fluctuation of symptoms with weather or environmental changes or other aggravating or alleviating factors except as outlined above   No unusual exposure hx or h/o childhood pna/ or knowledge of premature birth.  Current Allergies, Complete Past Medical History, Past Surgical History, Family History, and Social History were reviewed in [employer] record.  ROS  The following are not active complaints unless bolded Hoarseness, sore throat(dysphagia) , dysphagia, dental problems, itching, sneezing,  nasal congestion or discharge of excess mucus or purulent secretions, ear ache,   fever, chills, sweats, unintended wt loss or wt gain, classically pleuritic or exertional cp,  orthopnea pnd or arm/hand swelling  or leg swelling, presyncope, palpitations, abdominal pain, anorexia, nausea, vomiting, diarrhea  or change in bowel habits or change in bladder habits, change in stools or change in urine, dysuria, hematuria,  rash, arthralgias, visual complaints, headache, numbness, weakness or ataxia or problems with walking or coordination,  change in mood or  memory.        Current Meds  Medication Sig   albuterol  (PROVENTIL ) (2.5 MG/3ML) 0.083% nebulizer solution Take 3 mLs (2.5 mg total) by nebulization every 6 (six) hours as needed for wheezing or shortness of breath.   albuterol  (VENTOLIN  HFA) 108 (90 Base) MCG/ACT inhaler Inhale 2 puffs into the lungs every 4 (four) hours as needed for wheezing or shortness of breath.   aspirin  EC 81 MG tablet Take 1 tablet (81 mg total) by mouth daily at 6 (six) AM. Swallow whole.   budesonide  (PULMICORT ) 0.25 MG/2ML nebulizer solution Use twice daily with performist   cetirizine (ZYRTEC) 10 MG tablet Take 10 mg by mouth daily.    clotrimazole  (LOTRIMIN ) 1 % cream Apply to affected area 2 times daily (Patient taking differently: Apply 1 Application topically 2 (two) times daily as needed (itching).)   COMBIGAN  0.2-0.5 % ophthalmic solution Apply 1 drop to eye 2 (two) times daily.   esomeprazole  (NEXIUM ) 40 MG capsule Take 30-60 min before first meal of the day   famotidine  (PEPCID ) 20 MG tablet One after supper   finasteride  (PROSCAR ) 5 MG tablet Take 1 tablet (5 mg total)  by mouth daily.   ipratropium-albuterol  (DUONEB) 0.5-2.5 (3) MG/3ML SOLN Take 3 mLs by nebulization 4 (four) times daily.   LORazepam  (ATIVAN ) 0.5 MG tablet Take 1 tablet (0.5 mg total) by mouth at bedtime as needed.   losartan  (COZAAR ) 25 MG tablet Take 1 tablet (25 mg total) by mouth daily.   lovastatin  (MEVACOR ) 40 MG tablet Take 1 tablet (40 mg total) by mouth daily.   meclizine  (ANTIVERT ) 25 MG tablet Take 1 tablet (25 mg total) by mouth 3 (three) times daily as needed for dizziness.   metroNIDAZOLE  (METROGEL ) 0.75 % gel Apply 1 Application topically daily as needed (rosacea).   nystatin  (MYCOSTATIN ) 100000 UNIT/ML suspension Take 5 mLs (500,000 Units total) by mouth 4 (four) times daily. Swish and swallow.   oxymetazoline  (AFRIN) 0.05 % nasal spray Place 1 spray into both nostrils 2 (two) times daily as needed for congestion.   Spacer/Aero-Holding Ismael Maria Use as directed   tamsulosin  (FLOMAX ) 0.4 MG CAPS capsule Take 1 capsule (0.4 mg total) by mouth daily.   triamcinolone  cream (KENALOG ) 0.1 % Apply 1 Application topically 2 (two) times daily as needed (itching).           Past Medical History:  Diagnosis Date   AAA (abdominal aortic aneurysm) (HCC)    needs yearly ultrasound   Allergy    Anemia    Arthritis    Asthma    BCC (basal cell carcinoma) 08/18/1989   left shoulder blad, upper right arm, left arm beyond elbow, c&d   BCC (basal cell carcinoma) 01/31/1992   Posterior neck, curetx3, 62fu   BCC (basal cell carcinoma)  11/22/2001   mid forehead, cx3, excision, right forearm, cx3, 6fu   BCC (basal cell carcinoma) 10/09/2003   mid forehead, MOHs   BCC (basal cell carcinoma) 08/15/2008   upper left back, biopsy   BPH (benign prostatic hyperplasia)    CAD (coronary artery disease)    Cancer (HCC)    skin cancer   COPD (chronic obstructive pulmonary disease) (HCC)    Dysrhythmia    pt. states it can be fast at times   GERD (gastroesophageal reflux disease)    Glaucoma    History of acute pancreatitis 12/21/2022   HOH (hard of hearing)    Hypercholesterolemia    Hypertension    Impaired fasting glucose    Low back pain    Melanoma in situ (HCC) 10/09/2003   left chin, MOHs   MI (myocardial infarction) (HCC) 1999   SCC (squamous cell carcinoma) 07/03/2014   in situ, behind left ear, cx3, cautery, 29fu   SCC (squamous cell carcinoma) 07/03/2014   well diff, left forearm, biopsy, cx1, cautery   SCC (squamous cell carcinoma) 07/20/2017   in situ, left upper arm, cx3, 11fu   SCC (squamous cell carcinoma) 01/10/2019   in situ, left post shoulder, cx3, 41fu   SCC (squamous cell carcinoma) 11/22/2001   left forearm distal, left forearm, cx3, 4fu   SCC (squamous cell carcinoma) 10/09/2003   [place], left ear post, clear per st, right cheek clear   SCC (squamous cell carcinoma) 03/30/2004   in situ, left upper arm, cx3, 80fu   SCC (squamous cell carcinoma) 03/08/2005   in situ, right cheek, mid upper forehead, cx3, 8fu   SCC (squamous cell carcinoma) 06/08/2006   in situ, left shoulder, cx3, 26fu   SCC (squamous cell carcinoma) 05/05/2010   right inner wrist, biopsy   SCC (squamous cell carcinoma) 09/13/2013   in situ,  right crown scalp, front scalp, biopsy   Thrush       Objective:    Wts  10/20/2023         205  09/22/2023         202  08/15/2023       207   07/29/23 205 lb (93 kg)  07/22/23 207 lb 8 oz (94.1 kg)  07/21/23 210 lb 3.2 oz (95.3 kg)      Vital signs reviewed  10/20/2023  - Note at  rest 02 sats  96% on RA   General appearance:    amb slt hoarse wm nad   HEENT : Oropharynx  clear   Nasal turbinates nl    NECK :  without  apparent JVD/ palpable Nodes/TM    LUNGS: no acc muscle use,  Mild barrel  contour chest wall with bilateral  Distant bs s audible wheeze and  without cough on insp or exp maneuvers  and mild  Hyperresonant  to  percussion bilaterally     CV:  RRR  no s3 or murmur or increase in P2, and no edema   ABD:  quite obese soft and nontender with pos end  insp Hoover's  in the supine position.  No bruits or organomegaly appreciated   MS:  Nl gait/ ext warm without deformities Or obvious joint restrictions  calf tenderness, cyanosis or clubbing     SKIN: warm and dry without lesions    NEURO:  alert, approp, nl sensorium with  no motor or cerebellar deficits apparent.      BNP  10/20/2023  = 47   Assessment

## 2023-10-20 ENCOUNTER — Ambulatory Visit (INDEPENDENT_AMBULATORY_CARE_PROVIDER_SITE_OTHER): Admitting: Internal Medicine

## 2023-10-20 ENCOUNTER — Encounter: Payer: Self-pay | Admitting: Internal Medicine

## 2023-10-20 VITALS — BP 131/78 | HR 70 | Ht 73.0 in | Wt 205.0 lb

## 2023-10-20 DIAGNOSIS — Q791 Other congenital malformations of diaphragm: Secondary | ICD-10-CM | POA: Diagnosis not present

## 2023-10-20 DIAGNOSIS — R0609 Other forms of dyspnea: Secondary | ICD-10-CM | POA: Insufficient documentation

## 2023-10-20 DIAGNOSIS — J449 Chronic obstructive pulmonary disease, unspecified: Secondary | ICD-10-CM | POA: Diagnosis not present

## 2023-10-20 MED ORDER — FLUTICASONE-SALMETEROL 230-21 MCG/ACT IN AERO: INHALATION_SPRAY | RESPIRATORY_TRACT | 12 refills | Status: AC

## 2023-10-20 NOTE — Patient Instructions (Addendum)
 After a week of the same program you are on now CHANGE to  Plan A = Automatic = Always=    Advair  230 Take 2 puffs first thing in am and then another 2 puffs about 12 hours later.    Plan B = Backup (to supplement plan A, not to replace it) Only use your albuterol  inhaler as a rescue medication to be used if you can't catch your breath by resting or doing a relaxed purse lip breathing pattern.  - The less you use it, the better it will work when you need it. - Ok to use the inhaler up to 2 puffs  every 4 hours if you must but call for appointment if use goes up over your usual need - Don't leave home without it !!  (think of it like the spare tire for your car)    Plan C = Crisis (instead of Plan B but only if Plan B stops working) - only use your albuterol -ipatropium  nebulizer if you first try Plan B and it fails to help > ok to use the nebulizer up to every 4 hours but if start needing it regularly call for immediate appointment  Also  Ok to try albuterol  15 min before an activity (on alternating days between the inhaler the nebulizer and nothing)  that you know would usually make you short of breath (shed to house) and see if it makes any difference and if makes none then don't take albuterol  after activity unless you can't catch your breath as this means it's the resting that helps, not the albuterol .     NO MINTS   Please remember to go to the lab department   for your tests - we will call you with the results when they are available.     Please schedule a follow up office visit in 6 weeks, call sooner if needed with all respiratory medications /inhalers/ solutions in hand so we can verify exactly what you are taking

## 2023-10-21 LAB — BRAIN NATRIURETIC PEPTIDE: BNP: 47.4 pg/mL (ref 0.0–100.0)

## 2023-10-22 DIAGNOSIS — Q791 Other congenital malformations of diaphragm: Secondary | ICD-10-CM | POA: Insufficient documentation

## 2023-10-22 NOTE — Assessment & Plan Note (Signed)
 L HD eventration (ant portion) noted on 1st lateral view in 2011 with wt at 1st pulmonary eval in 07/2019 = 201 (baseline since 2012)    Probably of no consequence but note moderate centripetal obesity pattern is adding to the "load" on L  side of the diaphragm which is already at a disadvantage due to this likely congenital defect and may benefit from wt loss/ advised.  Each maintenance medication was reviewed in detail including emphasizing most importantly the difference between maintenance and prns and under what circumstances the prns are to be triggered using an action plan format where appropriate.  Total time for H and P, chart review, counseling, reviewing hfa/neb device(s) , directly observing portions of ambulatory 02 saturation study/ and generating customized AVS unique to this office visit / same day charting = 46 min for refractory respiratory  symptoms of uncertain etiology

## 2023-10-22 NOTE — Assessment & Plan Note (Addendum)
 Quit smoking 2004/ Alpha one level 138  -  asthma onset as child  - PFT's  11/05/19  FEV1 1.72 (47 % ) ratio 0.61  p 8 % improvement from saba p saba  prior to study with DLCO  32.71 (114 %)   and FV curve classically concave    - 05/05/2023  After extensive coaching inhaler device,  effectiveness =    75% vs baseline 25 % try d/c advair  (hoaresness) and breyna  160 2bid (trained on breztri  to use one bid sample but could not tol) - 08/15/2023 changed to neb formoterol  and bud 0.25 bid> still requiring freq daytime saba neb so rec duoneb qid and bud 0.25 mg bid plus gerd rx x 4 weeks then return   - 09/22/2023   Walked on RA  x  3  lap(s) =  approx 450  ft  @ mod pace, stopped due to end of study with lowest 02 sats 93% and no sob   -09/22/2023   still requiring freq daytime saba neb so rec duoneb qid and bud 0.25 mg bid plus gerd rx x 4 weeks then return  to re eval doe to mailbox and perceived need for extra saba  - 10/20/2023  After extensive coaching inhaler device,  effectiveness =    80% (short ti) > changed back to advair  240 at his request "it helps me cough stuff up"  - Allergy screen 10/20/2023 >  Eos 0. /  IgE   alpha one AT phenotype  - 10/20/2023  @ wt 205  Walked on RA  x  3  lap(s) =  approx 450  ft  @ mod  pace, stopped due to end of study  with lowest 02 sats 96% s sob/ c/o "dizzy" at end  - 10/20/2023  After extensive coaching inhaler device,  effectiveness =    75% (short ti)> changed from duoneb/bud to advair  230 at his request p one more week using the duoneb prior to walking to shed and back   DDX of  difficult airways management almost all start with A and  include Adherence, Ace Inhibitors, Acid Reflux, Active Sinus Disease, Alpha 1 Antitripsin deficiency, Anxiety masquerading as Airways dz,  ABPA,  Allergy(esp in young), Aspiration (esp in elderly), Adverse effects of meds,  Active smoking or vaping, A bunch of PE's (a small clot burden can't cause this syndrome unless there is already severe  underlying pulm or vascular dz with poor reserve) plus two Bs  = Bronchiectasis and Beta blocker use..and one C= CHF  Adherence is always the initial "prime suspect" and is a multilayered concern that requires a "trust but verify" approach in every patient - starting with knowing how to use medications, especially inhalers, correctly, keeping up with refills and understanding the fundamental difference between maintenance and prns vs those medications only taken for a very short course and then stopped and not refilled.  - see hfa teaching  - return with all meds in hand using a trust but verify approach to confirm accurate Medication  Reconciliation The principal here is that until we are certain that the  patients are doing what we've asked, it makes no sense to ask them to do more.   ? Acid (or non-acid) GERD > always difficult to exclude as up to 75% of pts in some series report no assoc GI/ Heartburn symptoms> rec continue max (24h)  acid suppression and diet restrictions/ reviewed  Rec:  NO MINTS  ? Allergy /asthma >  check labs/ note no  better on budesonide  neb but he insists advair  230 better so fine to rechallnege but: Re SABA :  I spent extra time with pt today reviewing appropriate use of albuterol  for prn use on exertion with the following points: 1) saba is for relief of sob that does not improve by walking a slower pace or resting but rather if the pt does not improve after trying this first. 2) If the pt is convinced, as many are, that saba helps recover from activity faster then it's easy to tell if this is the case by re-challenging : ie stop, take the inhaler, then p 5 minutes try the exact same activity (intensity of workload) that just caused the symptoms and see if they are substantially diminished or not after saba 3) if there is an activity that reproducibly causes the symptoms, try the saba 15 min before the activity on alternate days   If in fact the saba really does help,  then fine to continue to use it prn but advised may need to look closer at the maintenance regimen being used (was duoneb/bud now changing to advair  230)  to achieve better control of airways disease with exertion.   ? Anxiety/depression/ deconditioning  > usually at the bottom of this list of usual suspects but should be much higher on this pt's based on H and P and   may interfere with adherence and also interpretation of response or lack thereof to symptom management which can be quite subjective.   ? Alpha AT def > repeat level, this time with phenotype   CHF unlikely with bnp so low   F/u 6 weeks sooner if needed

## 2023-10-24 DIAGNOSIS — L718 Other rosacea: Secondary | ICD-10-CM | POA: Diagnosis not present

## 2023-10-24 DIAGNOSIS — L218 Other seborrheic dermatitis: Secondary | ICD-10-CM | POA: Diagnosis not present

## 2023-10-24 DIAGNOSIS — Z85828 Personal history of other malignant neoplasm of skin: Secondary | ICD-10-CM | POA: Diagnosis not present

## 2023-10-24 DIAGNOSIS — K120 Recurrent oral aphthae: Secondary | ICD-10-CM | POA: Diagnosis not present

## 2023-10-24 DIAGNOSIS — Z08 Encounter for follow-up examination after completed treatment for malignant neoplasm: Secondary | ICD-10-CM | POA: Diagnosis not present

## 2023-10-27 ENCOUNTER — Ambulatory Visit: Payer: Medicare Other | Admitting: Family Medicine

## 2023-10-27 ENCOUNTER — Encounter: Payer: Self-pay | Admitting: Family Medicine

## 2023-10-27 ENCOUNTER — Ambulatory Visit: Payer: Self-pay | Admitting: Internal Medicine

## 2023-10-27 VITALS — BP 125/70 | HR 70 | Temp 98.1°F | Ht 73.0 in | Wt 204.0 lb

## 2023-10-27 DIAGNOSIS — R002 Palpitations: Secondary | ICD-10-CM

## 2023-10-27 DIAGNOSIS — R42 Dizziness and giddiness: Secondary | ICD-10-CM

## 2023-10-27 LAB — IGE: IgE (Immunoglobulin E), Serum: 183 [IU]/mL (ref 6–495)

## 2023-10-27 LAB — ALPHA-1-ANTITRYPSIN PHENOTYP: A-1 Antitrypsin: 141 mg/dL (ref 101–187)

## 2023-10-27 LAB — CBC WITH DIFFERENTIAL/PLATELET
Basophils Absolute: 0.1 10*3/uL (ref 0.0–0.2)
Basos: 1 %
EOS (ABSOLUTE): 0.3 10*3/uL (ref 0.0–0.4)
Eos: 4 %
Hematocrit: 44.9 % (ref 37.5–51.0)
Hemoglobin: 14.4 g/dL (ref 13.0–17.7)
Immature Grans (Abs): 0 10*3/uL (ref 0.0–0.1)
Immature Granulocytes: 0 %
Lymphocytes Absolute: 1.4 10*3/uL (ref 0.7–3.1)
Lymphs: 18 %
MCH: 28.2 pg (ref 26.6–33.0)
MCHC: 32.1 g/dL (ref 31.5–35.7)
MCV: 88 fL (ref 79–97)
Monocytes Absolute: 0.6 10*3/uL (ref 0.1–0.9)
Monocytes: 8 %
Neutrophils Absolute: 5.4 10*3/uL (ref 1.4–7.0)
Neutrophils: 69 %
Platelets: 178 10*3/uL (ref 150–450)
RBC: 5.1 x10E6/uL (ref 4.14–5.80)
RDW: 14 % (ref 11.6–15.4)
WBC: 7.8 10*3/uL (ref 3.4–10.8)

## 2023-10-27 NOTE — Progress Notes (Signed)
Patient has reviewed results and recommendations via My Chart.

## 2023-10-27 NOTE — Patient Instructions (Signed)
 Hold the Losartan .  I am hopeful that rehab will help.  Follow up in 3 months.

## 2023-10-30 NOTE — Assessment & Plan Note (Signed)
 Holding losartan .  Hopefully vestibular rehab will help.

## 2023-10-30 NOTE — Progress Notes (Signed)
 Subjective:  Patient ID: [name] [name], male    DOB: 10-02-1940  Age: 83 y.o. MRN: [id number]  CC:   Chief Complaint  Patient presents with   light headed , dizzy    Follow up still occurring     HPI:  83 year old male with an extensive medical history presents for follow-up.  He continues to have lightheadedness and dizziness.  Change to Flomax  has not helped.  Meclizine  not really helping either.  Has upcoming appointment with vestibular rehab.  Some associated nausea.  Patient Active Problem List   Diagnosis Date Noted   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   DOE (dyspnea on exertion) 10/20/2023   Dizziness 09/20/2023   Anemia 08/30/2023   Cervical radiculopathy 08/15/2023   BPH associated with nocturia 05/24/2023   COPD GOLD 3/ AB 05/05/2023   Celiac artery stenosis (HCC) 02/02/2023   Chronic idiopathic constipation 12/21/2022   Bilateral primary osteoarthritis of knee 04/14/2022   Irritable bowel syndrome 03/17/2022   Recurrent right lower quadrant abdominal pain 03/03/2022   Seasonal allergies 08/06/2021   CAD (coronary artery disease) 04/23/2021   Hypertension 09/16/2016   Lumbar spondylosis 11/05/2014   GERD (gastroesophageal reflux disease) 01/19/2012   Hyperlipidemia 04/15/2008   AAA (abdominal aortic aneurysm) (HCC) 04/15/2008    Social Hx   Social History   Socioeconomic History   Marital status: Widowed    Spouse name: Not on file   Number of children: Not on file   Years of education: Not on file   Highest education level: Not on file  Occupational History   Not on file  Tobacco Use   Smoking status: Former    Current packs/day: 0.00    Average packs/day: 1 pack/day for 35.0 years (35.0 ttl pk-yrs)    Types: Cigarettes    Start date: 03/27/1968    Quit date: 03/28/2003    Years since quitting: 20.6   Smokeless tobacco: Never  Vaping Use   Vaping status: Never Used  Substance and Sexual Activity   Alcohol use: No     Alcohol/week: 0.0 standard drinks of alcohol   Drug use: No   Sexual activity: Not Currently    Birth control/protection: None  Other Topics Concern   Not on file  Social History Narrative   Not on file   Social Drivers of Health   Financial Resource Strain: Low Risk  (03/18/2022)   Overall Financial Resource Strain (CARDIA)    Difficulty of Paying Living Expenses: Not very hard  Food Insecurity: No Food Insecurity (08/01/2023)   Hunger Vital Sign    Worried About Running Out of Food in the Last Year: Never true    Ran Out of Food in the Last Year: Never true  Transportation Needs: No Transportation Needs (08/01/2023)   PRAPARE - Administrator, Civil Service (Medical): No    Lack of Transportation (Non-Medical): No  Physical Activity: Insufficiently Active (03/18/2022)   Exercise Vital Sign    Days of Exercise per Week: 2 days    Minutes of Exercise per Session: 20 min  Stress: No Stress Concern Present (03/18/2022)   [employer] of Occupational Health - Occupational Stress Questionnaire    Feeling of Stress : Not at all  Social Connections: Socially Isolated (07/30/2023)   Social Connection and Isolation Panel    Frequency of Communication with Friends and Family: More than three times a week    Frequency of Social Gatherings with Friends and Family: Three times a week  Attends Religious Services: Never    Active Member of Clubs or Organizations: No    Attends Banker Meetings: Never    Marital Status: Widowed    Review of Systems Per HPI  Objective:  BP 125/70   Pulse 70   Temp 98.1 F (36.7 C)   Ht 6' 1 (1.854 m)   Wt 204 lb (92.5 kg)   SpO2 97%   BMI 26.91 kg/m      10/27/2023   11:23 AM 10/20/2023   10:31 AM 09/29/2023   10:14 AM  BP/Weight  Systolic BP 125 131 132  Diastolic BP 70 78 82  Wt. (Lbs) 204 205 204  BMI 26.91 kg/m2 27.05 kg/m2 26.91 kg/m2    Physical Exam Vitals and nursing note reviewed.  Constitutional:       General: He is not in acute distress.    Appearance: Normal appearance.  HENT:     Head: Normocephalic and atraumatic.   Cardiovascular:     Rate and Rhythm: Normal rate.     Comments: Irregular. ? Ectopy. Pulmonary:     Effort: Pulmonary effort is normal.     Breath sounds: Normal breath sounds.   Neurological:     Mental Status: He is alert.     Lab Results  Component Value Date   WBC 7.8 10/20/2023   HGB 14.4 10/20/2023   HCT 44.9 10/20/2023   PLT 178 10/20/2023   GLUCOSE 95 08/30/2023   CHOL 134 08/30/2023   TRIG 115 08/30/2023   HDL 45 08/30/2023   LDLCALC 68 08/30/2023   ALT 12 08/30/2023   AST 16 08/30/2023   NA 141 08/30/2023   K 4.5 08/30/2023   CL 101 08/30/2023   CREATININE 0.79 08/30/2023   BUN 11 08/30/2023   CO2 25 08/30/2023   TSH 0.961 08/30/2023   PSA 1.80 07/26/2014   INR 1.1 07/29/2023   HGBA1C 6.0 (H) 08/30/2023   EKG: Normal sinus rhythm with frequent PACs.  Rate 78.  Assessment & Plan:  Palpitations -     EKG 12-Lead  Dizziness Assessment & Plan: Holding losartan .  Hopefully vestibular rehab will help.    Follow-up:  3 months  Abcde Oneil Debrah Fan DO [facility]

## 2023-11-01 ENCOUNTER — Encounter: Payer: Self-pay | Admitting: Family Medicine

## 2023-11-09 ENCOUNTER — Ambulatory Visit (HOSPITAL_COMMUNITY): Attending: Family Medicine

## 2023-11-09 ENCOUNTER — Other Ambulatory Visit: Payer: Self-pay

## 2023-11-09 ENCOUNTER — Encounter (HOSPITAL_COMMUNITY): Payer: Self-pay

## 2023-11-09 DIAGNOSIS — J392 Other diseases of pharynx: Secondary | ICD-10-CM | POA: Diagnosis not present

## 2023-11-09 DIAGNOSIS — R42 Dizziness and giddiness: Secondary | ICD-10-CM | POA: Diagnosis not present

## 2023-11-09 DIAGNOSIS — Z7409 Other reduced mobility: Secondary | ICD-10-CM | POA: Insufficient documentation

## 2023-11-09 DIAGNOSIS — R2689 Other abnormalities of gait and mobility: Secondary | ICD-10-CM | POA: Insufficient documentation

## 2023-11-09 DIAGNOSIS — R490 Dysphonia: Secondary | ICD-10-CM | POA: Diagnosis not present

## 2023-11-09 NOTE — Therapy (Signed)
 OUTPATIENT PHYSICAL THERAPY VESTIBULAR EVALUATION     Patient Name: [name] [name] MRN: [id number] DOB:[birth year], 83 y.o., male Today's Date: 11/10/2023  END OF SESSION:  PT End of Session - 11/09/23 1425     Visit Number 1    Date for PT Re-Evaluation 12/07/23    Authorization Type MEDICARE PART A AND B    Authorization Time Period no auth required    Progress Note Due on Visit 10    PT Start Time 1015 (P)     PT Stop Time 1100 (P)     PT Time Calculation (min) 45 min (P)     Activity Tolerance Patient tolerated treatment well (P)     Behavior During Therapy WFL for tasks assessed/performed (P)           Past Medical History:  Diagnosis Date   AAA (abdominal aortic aneurysm) (HCC)    needs yearly ultrasound   Allergy    Anemia    Arthritis    Asthma    BCC (basal cell carcinoma) 08/18/1989   left shoulder blad, upper right arm, left arm beyond elbow, c&d   BCC (basal cell carcinoma) 01/31/1992   Posterior neck, curetx3, 62fu   BCC (basal cell carcinoma) 11/22/2001   mid forehead, cx3, excision, right forearm, cx3, 11fu   BCC (basal cell carcinoma) 10/09/2003   mid forehead, MOHs   BCC (basal cell carcinoma) 08/15/2008   upper left back, biopsy   BPH (benign prostatic hyperplasia)    CAD (coronary artery disease)    Cancer (HCC)    skin cancer   COPD (chronic obstructive pulmonary disease) (HCC)    Dysrhythmia    pt. states it can be fast at times   GERD (gastroesophageal reflux disease)    Glaucoma    History of acute pancreatitis 12/21/2022   HOH (hard of hearing)    Hypercholesterolemia    Hypertension    Impaired fasting glucose    Low back pain    Melanoma in situ (HCC) 10/09/2003   left chin, MOHs   MI (myocardial infarction) (HCC) 1999   Peripheral vascular disease (HCC)    AAA   SCC (squamous cell carcinoma) 07/03/2014   in situ, behind left ear, cx3, cautery, 45fu   SCC (squamous cell carcinoma) 07/03/2014   well diff, left forearm, biopsy,  cx1, cautery   SCC (squamous cell carcinoma) 07/20/2017   in situ, left upper arm, cx3, 54fu   SCC (squamous cell carcinoma) 01/10/2019   in situ, left post shoulder, cx3, 34fu   SCC (squamous cell carcinoma) 11/22/2001   left forearm distal, left forearm, cx3, 55fu   SCC (squamous cell carcinoma) 10/09/2003   [place], left ear post, clear per st, right cheek clear   SCC (squamous cell carcinoma) 03/30/2004   in situ, left upper arm, cx3, 22fu   SCC (squamous cell carcinoma) 03/08/2005   in situ, right cheek, mid upper forehead, cx3, 53fu   SCC (squamous cell carcinoma) 06/08/2006   in situ, left shoulder, cx3, 39fu   SCC (squamous cell carcinoma) 05/05/2010   right inner wrist, biopsy   SCC (squamous cell carcinoma) 09/13/2013   in situ, right crown scalp, front scalp, biopsy   Thrush    Past Surgical History:  Procedure Laterality Date   ABDOMINAL AORTIC ENDOVASCULAR STENT GRAFT N/A 07/29/2023   Procedure: INSERTION, ENDOVASCULAR STENT GRAFT, AORTA, ABDOMINAL;  Surgeon: Gretta Lonni PARAS, MD;  Location: [facility];  Service: Vascular;  Laterality: N/A;   BACK SURGERY  x 3   CARDIAC CATHETERIZATION     angioplasty   CATARACT EXTRACTION W/PHACO  03/20/2012   Procedure: CATARACT EXTRACTION PHACO AND INTRAOCULAR LENS PLACEMENT (IOC);  Surgeon: Dow JULIANNA Burke, MD;  Location: [facility];  Service: Ophthalmology;  Laterality: Right;  CDE:  8.45   CATARACT EXTRACTION W/PHACO Left 04/02/2013   Procedure: CATARACT EXTRACTION PHACO AND INTRAOCULAR LENS PLACEMENT (IOC);  Surgeon: Dow JULIANNA Burke, MD;  Location: [facility];  Service: Ophthalmology;  Laterality: Left;  CDE:  6.50   CHOLECYSTECTOMY  2000   COLONOSCOPY  2009   repeat 5 years   ESOPHAGOGASTRODUODENOSCOPY     HERNIA REPAIR Left    inguinal   INGUINAL HERNIA REPAIR Right 03/28/2020   Procedure: Right Inguinal Herniorrhaphy with Mesh;  Surgeon: Mavis Anes, MD;  Location: [facility];  Service: General;  Laterality: Right;   LAPAROSCOPIC  PARTIAL COLECTOMY N/A 06/11/2013   Procedure: LAPAROSCOPIC HAND ASSISTED PARTIAL COLECTOMY;  Surgeon: Anes DELENA Mavis, MD;  Location: [facility];  Service: General;  Laterality: N/A;   NASAL ENDOSCOPY WITH EPISTAXIS CONTROL Bilateral 02/11/2020   Procedure: NASAL ENDOSCOPY WITH EPISTAXIS CONTROL;  Surgeon: Karis Clunes, MD;  Location: [facility];  Service: ENT;  Laterality: Bilateral;   REPAIR ILIAC ARTERY Right 07/29/2023   Procedure: REPAIR, ARTERY, FEMORAL;  Surgeon: Gretta Lonni PARAS, MD;  Location: [facility];  Service: Vascular;  Laterality: Right;   right eye detached retina Bilateral    SPINAL FUSION  2016   ULTRASOUND GUIDANCE FOR VASCULAR ACCESS Bilateral 07/29/2023   Procedure: ULTRASOUND GUIDANCE, FOR VASCULAR ACCESS;  Surgeon: Gretta Lonni PARAS, MD;  Location: [facility] OR;  Service: Vascular;  Laterality: Bilateral;   YAG LASER APPLICATION Left 05/06/2014   Procedure: YAG LASER APPLICATION;  Surgeon: Dow JULIANNA Burke, MD;  Location: [facility];  Service: Ophthalmology;  Laterality: Left;   Patient Active Problem List   Diagnosis Date Noted   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   DOE (dyspnea on exertion) 10/20/2023   Dizziness 09/20/2023   Anemia 08/30/2023   Cervical radiculopathy 08/15/2023   BPH associated with nocturia 05/24/2023   COPD GOLD 3/ AB 05/05/2023   Celiac artery stenosis (HCC) 02/02/2023   Chronic idiopathic constipation 12/21/2022   Bilateral primary osteoarthritis of knee 04/14/2022   Irritable bowel syndrome 03/17/2022   Recurrent right lower quadrant abdominal pain 03/03/2022   Seasonal allergies 08/06/2021   CAD (coronary artery disease) 04/23/2021   Hypertension 09/16/2016   Lumbar spondylosis 11/05/2014   GERD (gastroesophageal reflux disease) 01/19/2012   Hyperlipidemia 04/15/2008   AAA (abdominal aortic aneurysm) (HCC) 04/15/2008    PCP: Cook, Jayce G, DO REFERRING PROVIDER: Cook, Jayce G, DO  REFERRING DIAG: R42 (ICD-10-CM) -  Dizziness  THERAPY DIAG:  Dizziness and giddiness  Impaired functional mobility, balance, gait, and endurance  Other abnormalities of gait and mobility  ONSET DATE: 4-6 months  Rationale for Evaluation and Treatment: Rehabilitation  SUBJECTIVE:   SUBJECTIVE STATEMENT: Pt was experiencing poor balance, lightheadedness, and dizziness for a few months. Pt cut lezortan and flowmax and that helped with decreased dizziness reported. Pt states he still has balance issues, but dizziness has completely gone away for the last two weeks. Pt has been on something for dizziness for a week. 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 Pt also has hearing issues. Pt states he has been sedentary for about the last 5/6 years due to losing wife and then  having prostate issues, pt has since returned to walking until prostate issues and aortic procedure earlier this year. Pt has been getting shots in R knee every 3 months, for 3 years. Pt is wearing left knee brace upon presentation.  Pt accompanied by: self  PERTINENT HISTORY:  -4 back operations, last in 2015 -Has rare eye disease contracted in eye office from cross contamination -Prostate infection -Abdominal aortic aneurysm repair, March 2025 -Pt has had hernias in past  PAIN:  Are you having pain? No  PRECAUTIONS: Fall  RED FLAGS: Bowel or bladder incontinence: Yes: prostate issues, leaking   WEIGHT BEARING RESTRICTIONS: No  FALLS: Has patient fallen in last 6 months? Yes. Number of falls 1, going down last step to basement with load of laundry in hand  LIVING ENVIRONMENT: Lives with: lives alone Lives in: House/apartment Stairs: Yes: Internal: 15 steps; on right going up and External: 2 steps; none Has following equipment at home: Walking stick  PLOF: Independent  PATIENT GOALS: overall conditioning strengthening, balance, would like to get rid of dizziness  OBJECTIVE:  Note:  Objective measures were completed at Evaluation unless otherwise noted.  DIAGNOSTIC FINDINGS: NA  COGNITION: Overall cognitive status: Within functional limits for tasks assessed   SENSATION: WFL Pt states maybe some neuropathy in feet due to poor circulation and swell towards the end of the day   Cervical ROM:    Active A/PROM (deg) eval  Flexion   Extension   Right lateral flexion   Left lateral flexion   Right rotation   Left rotation   (Blank rows = not tested)    LOWER EXTREMITY MMT:   MMT Right eval Left eval  Hip flexion 4 3+  Hip abduction 4 4  Hip adduction 5 5  Hip internal rotation    Hip external rotation    Knee flexion 4 4-  Knee extension 4 4-  Ankle dorsiflexion 4- 3  Ankle plantarflexion    Ankle inversion    Ankle eversion    (Blank rows = not tested)   GAIT: Gait pattern: WFL, decreased arm swing- Right, decreased arm swing- Left, and decreased stride length Distance walked: 300 feet Assistive device utilized: None Level of assistance: Complete Independence Comments: Pt demonstrates difficulty walking in straight line, decreased arm swing, decreased eccentric control of left anterior tibialis.  FUNCTIONAL TESTS:  5 times sit to stand: 13.47 seconds 2 minute walk test: 300 feet SLS 11/09/23: R: .99 seconds L: 2.37 Seconds  PATIENT SURVEYS:  ABC scale: TBA   VESTIBULAR ASSESSMENT:  SYMPTOM BEHAVIOR:  Subjective history: pt denies room spinning  Non-Vestibular symptoms: changes in hearing and changes in vision  Type of dizziness: Diplopia, Imbalance (Disequilibrium), and Lightheadedness/Faint   Progression of symptoms: better  OCULOMOTOR EXAM:  Ocular Alignment: normal  Ocular ROM: No Limitations  Spontaneous Nystagmus: absent  Gaze-Induced Nystagmus: absent  Smooth Pursuits: intact  Saccades: dysmetria  Convergence/Divergence: 6 cm  Pts pupils do not respond correctly to light, difficulty with peripheral vision  reported   VESTIBULAR - OCULAR REFLEX:   Slow VOR: Normal  VOR Cancellation: Comment: TBA  Head-Impulse Test: tba     POSITIONAL TESTING: Other: deferred today due to pt being medicated and no dizziness in the last couple of weeks  FUNCTIONAL GAIT: Functional gait assessment: TBA  TREATMENT DATE:  11/09/2023   Evaluation: -ROM measured, Strength assessed, HEP prescribed, pt educated on prognosis, findings, and importance of HEP compliance if given.   Canalith Repositioning:  Comment: TBA   PATIENT EDUCATION: Education details: Pt was educated on findings of PT evaluation, prognosis, frequency of therapy visits and rationale, attendance policy, and HEP if given.   Person educated: Patient Education method: Explanation, Verbal cues, and Handouts Education comprehension: verbalized understanding, verbal cues required, and needs further education  HOME EXERCISE PROGRAM: Access Code: [email or web address] URL: [email or web address] Date: 11/10/2023 Prepared by: Lang Ada  Exercises - Heel Toe Raises with Counter Support  - 1 x daily - 7 x weekly - 3 sets - 10 reps - Standing Single Leg Stance with Counter Support  - 1 x daily - 7 x weekly - 3 sets - 10 reps  GOALS: Goals reviewed with patient? No  SHORT TERM GOALS: Target date: 11/23/23  Pt will be independent with home exercise program in order to improve balance and decrease dizziness symptoms in order to decrease fall risk and improve function at home and work. Baseline:  Goal status: INITIAL  2.  Pt will report a improvement of 25% in dizziness symptoms from the date of evaluation for improved quality of life. Baseline:  Goal status: INITIAL  LONG TERM GOALS: Target date: 12/07/23  Patient will have improved ABC score of 15 percent or greater in order to demonstrate improvements in  patient's balance during ADLs and functional activities.  Baseline: see objective Goal status: INITIAL  2.  Patient will demonstrate reduced falls risk as evidenced by FGA, 22/30. Baseline: see objective Goal status: INITIAL   4.  Patient will reduce perceived disability to low levels as indicated by <40 on Dizziness Handicap Inventory. Baseline: see objective Goal status: INITIAL  5.  Patient will report 50% or better improvement in their dizziness and imbalance symptoms overall in order for patient to be able to perform ADLs and resume prior activities. Baseline:  Goal status: INITIAL    ASSESSMENT:  CLINICAL IMPRESSION: Patient is a 84 y.o. male who was seen today for physical therapy evaluation and treatment for R42 (ICD-10-CM) - Dizziness.   Patient demonstrates improved dizziness for about a week and a half, plan to reasses dizziness next session with pt planning on ceasing medication for more informative evaluation. Pt demonstrates decreased LE strength, abnormal gait pattern, and impaired balance. Patient also demonstrates difficulty with ambulation during today's session with decreased stride length, varied gait path and decreased velocity noted. Patient also demonstrates inability to maintain SLS balance bilaterally greater than 2 seconds. Patient requires education of role of PT, POC, and importance of HEP compliance. Patient would benefit from skilled physical therapy for decreased dizziness, increased endurance with ambulation, increased LE strength, and balance for improved gait quality, return to higher level of function with ADLs, and progress towards therapy goals.   OBJECTIVE IMPAIRMENTS: Abnormal gait, decreased activity tolerance, decreased balance, decreased endurance, decreased mobility, difficulty walking, and decreased strength.   ACTIVITY LIMITATIONS: carrying, lifting, bending, squatting, and bed mobility  PARTICIPATION LIMITATIONS: driving, shopping, community  activity, and yard work  PERSONAL FACTORS: Age, Fitness, Past/current experiences, and Time since onset of injury/illness/exacerbation are also affecting patient's functional outcome.   REHAB POTENTIAL: Fair medications seem to have dizziness symptoms subsided at this date causing increased difficulty with pin pointing cause of dizziness  CLINICAL DECISION MAKING: Evolving/moderate complexity  EVALUATION COMPLEXITY: Moderate   PLAN:  PT FREQUENCY: 2x/week  PT DURATION: 4  weeks  PLANNED INTERVENTIONS: 97110-Therapeutic exercises, 97530- Therapeutic activity, 97112- Neuromuscular re-education, [phone]- Self Care, [id number]- Manual therapy, [phone]- Gait training, Patient/Family education, Balance training, Stair training, Vestibular training, and DME instructions  PLAN FOR NEXT SESSION: VOR assessment, ABC scale, FGA, review goals and HEP, progress HEP where necessary, progress balance and LE strengthening   Lang Ada, PT, DPT [facility] Office: [phone] 5:33 PM, 11/10/23

## 2023-11-14 DIAGNOSIS — H401122 Primary open-angle glaucoma, left eye, moderate stage: Secondary | ICD-10-CM | POA: Diagnosis not present

## 2023-11-14 DIAGNOSIS — H01134 Eczematous dermatitis of left upper eyelid: Secondary | ICD-10-CM | POA: Diagnosis not present

## 2023-11-16 ENCOUNTER — Encounter (HOSPITAL_COMMUNITY)

## 2023-11-17 ENCOUNTER — Telehealth: Payer: Self-pay | Admitting: *Deleted

## 2023-11-17 NOTE — Telephone Encounter (Signed)
 Copied from CRM [id number]. Topic: Clinical - Medication Question >> Nov 17, 2023 11:17 AM Devaughn RAMAN wrote: Reason for CRM: Patient called regarding the medication fluticasone -salmeterol (ADVAIR  HFA) 230-21 MCG/ACT inhaler. Patient stated the discharge note say 240, however the other says 230 patient said it was prescribed by Dr.Olalere. Patient would  to clarify.   Returned call to patient and advised per AVS Advair  230 2 puffs am then 2 puffs pm. NFN

## 2023-11-22 ENCOUNTER — Ambulatory Visit (HOSPITAL_COMMUNITY): Attending: Family Medicine

## 2023-11-22 ENCOUNTER — Encounter (HOSPITAL_COMMUNITY): Payer: Self-pay

## 2023-11-22 DIAGNOSIS — R2689 Other abnormalities of gait and mobility: Secondary | ICD-10-CM | POA: Insufficient documentation

## 2023-11-22 DIAGNOSIS — R42 Dizziness and giddiness: Secondary | ICD-10-CM | POA: Diagnosis not present

## 2023-11-22 DIAGNOSIS — Z7409 Other reduced mobility: Secondary | ICD-10-CM | POA: Diagnosis not present

## 2023-11-22 NOTE — Therapy (Addendum)
 OUTPATIENT PHYSICAL THERAPY VESTIBULAR/BALANCE TREATMENT     Patient Name: [name] [name] MRN: [id number] DOB:17-Jan-1941, 83 y.o., male Today's Date: 11/22/2023  END OF SESSION:  PT End of Session - 11/22/23 1256     Visit Number 2    Number of Visits 12    Date for PT Re-Evaluation 12/07/23    Authorization Type MEDICARE PART A AND B    Authorization Time Period no auth required    PT Start Time 1300    PT Stop Time 1340    PT Time Calculation (min) 40 min    Activity Tolerance Patient tolerated treatment well    Behavior During Therapy WFL for tasks assessed/performed           Past Medical History:  Diagnosis Date   AAA (abdominal aortic aneurysm) (HCC)    needs yearly ultrasound   Allergy    Anemia    Arthritis    Asthma    BCC (basal cell carcinoma) 08/18/1989   left shoulder blad, upper right arm, left arm beyond elbow, c&d   BCC (basal cell carcinoma) 01/31/1992   Posterior neck, curetx3, 45fu   BCC (basal cell carcinoma) 11/22/2001   mid forehead, cx3, excision, right forearm, cx3, 71fu   BCC (basal cell carcinoma) 10/09/2003   mid forehead, MOHs   BCC (basal cell carcinoma) 08/15/2008   upper left back, biopsy   BPH (benign prostatic hyperplasia)    CAD (coronary artery disease)    Cancer (HCC)    skin cancer   COPD (chronic obstructive pulmonary disease) (HCC)    Dysrhythmia    pt. states it can be fast at times   GERD (gastroesophageal reflux disease)    Glaucoma    History of acute pancreatitis 12/21/2022   HOH (hard of hearing)    Hypercholesterolemia    Hypertension    Impaired fasting glucose    Low back pain    Melanoma in situ (HCC) 10/09/2003   left chin, MOHs   MI (myocardial infarction) (HCC) 1999   Peripheral vascular disease (HCC)    AAA   SCC (squamous cell carcinoma) 07/03/2014   in situ, behind left ear, cx3, cautery, 40fu   SCC (squamous cell carcinoma) 07/03/2014   well diff, left forearm, biopsy, cx1, cautery   SCC (squamous  cell carcinoma) 07/20/2017   in situ, left upper arm, cx3, 98fu   SCC (squamous cell carcinoma) 01/10/2019   in situ, left post shoulder, cx3, 50fu   SCC (squamous cell carcinoma) 11/22/2001   left forearm distal, left forearm, cx3, 38fu   SCC (squamous cell carcinoma) 10/09/2003   [place], left ear post, clear per st, right cheek clear   SCC (squamous cell carcinoma) 03/30/2004   in situ, left upper arm, cx3, 42fu   SCC (squamous cell carcinoma) 03/08/2005   in situ, right cheek, mid upper forehead, cx3, 69fu   SCC (squamous cell carcinoma) 06/08/2006   in situ, left shoulder, cx3, 30fu   SCC (squamous cell carcinoma) 05/05/2010   right inner wrist, biopsy   SCC (squamous cell carcinoma) 09/13/2013   in situ, right crown scalp, front scalp, biopsy   Thrush    Past Surgical History:  Procedure Laterality Date   ABDOMINAL AORTIC ENDOVASCULAR STENT GRAFT N/A 07/29/2023   Procedure: INSERTION, ENDOVASCULAR STENT GRAFT, AORTA, ABDOMINAL;  Surgeon: Gretta Lonni PARAS, MD;  Location: [facility];  Service: Vascular;  Laterality: N/A;   BACK SURGERY     x 3   CARDIAC CATHETERIZATION  angioplasty   CATARACT EXTRACTION W/PHACO  03/20/2012   Procedure: CATARACT EXTRACTION PHACO AND INTRAOCULAR LENS PLACEMENT (IOC);  Surgeon: Dow JULIANNA Burke, MD;  Location: [facility];  Service: Ophthalmology;  Laterality: Right;  CDE:  8.45   CATARACT EXTRACTION W/PHACO Left 04/02/2013   Procedure: CATARACT EXTRACTION PHACO AND INTRAOCULAR LENS PLACEMENT (IOC);  Surgeon: Dow JULIANNA Burke, MD;  Location: [facility];  Service: Ophthalmology;  Laterality: Left;  CDE:  6.50   CHOLECYSTECTOMY  2000   COLONOSCOPY  2009   repeat 5 years   ESOPHAGOGASTRODUODENOSCOPY     HERNIA REPAIR Left    inguinal   INGUINAL HERNIA REPAIR Right 03/28/2020   Procedure: Right Inguinal Herniorrhaphy with Mesh;  Surgeon: Mavis Anes, MD;  Location: [facility];  Service: General;  Laterality: Right;   LAPAROSCOPIC PARTIAL COLECTOMY N/A 06/11/2013    Procedure: LAPAROSCOPIC HAND ASSISTED PARTIAL COLECTOMY;  Surgeon: Anes DELENA Mavis, MD;  Location: [facility];  Service: General;  Laterality: N/A;   NASAL ENDOSCOPY WITH EPISTAXIS CONTROL Bilateral 02/11/2020   Procedure: NASAL ENDOSCOPY WITH EPISTAXIS CONTROL;  Surgeon: Karis Clunes, MD;  Location: [facility];  Service: ENT;  Laterality: Bilateral;   REPAIR ILIAC ARTERY Right 07/29/2023   Procedure: REPAIR, ARTERY, FEMORAL;  Surgeon: Gretta Lonni PARAS, MD;  Location: [facility];  Service: Vascular;  Laterality: Right;   right eye detached retina Bilateral    SPINAL FUSION  2016   ULTRASOUND GUIDANCE FOR VASCULAR ACCESS Bilateral 07/29/2023   Procedure: ULTRASOUND GUIDANCE, FOR VASCULAR ACCESS;  Surgeon: Gretta Lonni PARAS, MD;  Location[facility];  Service: Vascular;  Laterality: Bilateral;   YAG LASER APPLICATION Left 05/06/2014   Procedure: YAG LASER APPLICATION;  Surgeon: Dow JULIANNA Burke, MD;  Location: [facility];  Service: Ophthalmology;  Laterality: Left;   Patient Active Problem List   Diagnosis Date Noted   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   DOE (dyspnea on exertion) 10/20/2023   Dizziness 09/20/2023   Anemia 08/30/2023   Cervical radiculopathy 08/15/2023   BPH associated with nocturia 05/24/2023   COPD GOLD 3/ AB 05/05/2023   Celiac artery stenosis (HCC) 02/02/2023   Chronic idiopathic constipation 12/21/2022   Bilateral primary osteoarthritis of knee 04/14/2022   Irritable bowel syndrome 03/17/2022   Recurrent right lower quadrant abdominal pain 03/03/2022   Seasonal allergies 08/06/2021   CAD (coronary artery disease) 04/23/2021   Hypertension 09/16/2016   Lumbar spondylosis 11/05/2014   GERD (gastroesophageal reflux disease) 01/19/2012   Hyperlipidemia 04/15/2008   AAA (abdominal aortic aneurysm) (HCC) 04/15/2008    PCP: Cook, Jayce G, DO REFERRING PROVIDER: Cook, Jayce G, DO  REFERRING DIAG: R42 (ICD-10-CM) - Dizziness  THERAPY DIAG:  Dizziness  and giddiness  Impaired functional mobility, balance, gait, and endurance  Other abnormalities of gait and mobility  ONSET DATE: 4-6 months  Rationale for Evaluation and Treatment: Rehabilitation  SUBJECTIVE:   SUBJECTIVE STATEMENT: Pt states he has had a few doctor appointments with ENT and ophthalmology, switched eye drops and medications and stopped Antivert . Pt states he still feels his lightheadedness and vertigo is a lot better, still feels a bit imbalance. Pt states he has done some of the HEP combined 4-5 times at least.  Pt was experiencing poor balance, lightheadedness, and dizziness for a few months. Pt cut lezortan and flowmax and that helped with decreased dizziness reported. Pt states he still has balance issues, but dizziness has completely gone away for the last two weeks. Pt has been on something for dizziness  for a week. 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 Pt also has hearing issues. Pt states he has been sedentary for about the last 5/6 years due to losing wife and then having prostate issues, pt has since returned to walking until prostate issues and aortic procedure earlier this year. Pt has been getting shots in R knee every 3 months, for 3 years. Pt is wearing left knee brace upon presentation.  Pt accompanied by: self  PERTINENT HISTORY:  -4 back operations, last in 2015 -Has rare eye disease contracted in eye office from cross contamination -Prostate infection -Abdominal aortic aneurysm repair, March 2025 -Pt has had hernias in past  PAIN:  Are you having pain? No  PRECAUTIONS: Fall  RED FLAGS: Bowel or bladder incontinence: Yes: prostate issues, leaking   WEIGHT BEARING RESTRICTIONS: No  FALLS: Has patient fallen in last 6 months? Yes. Number of falls 1, going down last step to basement with load of laundry in hand  LIVING ENVIRONMENT: Lives with: lives alone Lives in:  House/apartment Stairs: Yes: Internal: 15 steps; on right going up and External: 2 steps; none Has following equipment at home: Walking stick  PLOF: Independent  PATIENT GOALS: overall conditioning strengthening, balance, would like to get rid of dizziness  OBJECTIVE:  Note: Objective measures were completed at Evaluation unless otherwise noted.  DIAGNOSTIC FINDINGS: NA  COGNITION: Overall cognitive status: Within functional limits for tasks assessed   SENSATION: WFL Pt states maybe some neuropathy in feet due to poor circulation and swell towards the end of the day   Cervical ROM:    Active A/PROM (deg) eval  Flexion   Extension   Right lateral flexion   Left lateral flexion   Right rotation   Left rotation   (Blank rows = not tested)    LOWER EXTREMITY MMT:   MMT Right eval Left eval  Hip flexion 4 3+  Hip abduction 4 4  Hip adduction 5 5  Hip internal rotation    Hip external rotation    Knee flexion 4 4-  Knee extension 4 4-  Ankle dorsiflexion 4- 3  Ankle plantarflexion    Ankle inversion    Ankle eversion    (Blank rows = not tested)   GAIT: Gait pattern: WFL, decreased arm swing- Right, decreased arm swing- Left, and decreased stride length Distance walked: 300 feet Assistive device utilized: None Level of assistance: Complete Independence Comments: Pt demonstrates difficulty walking in straight line, decreased arm swing, decreased eccentric control of left anterior tibialis.  FUNCTIONAL TESTS:  5 times sit to stand: 13.47 seconds 2 minute walk test: 300 feet SLS 11/09/23: R: .99 seconds L: 2.37 Seconds  PATIENT SURVEYS:  ABC scale: TBA   VESTIBULAR ASSESSMENT:  SYMPTOM BEHAVIOR:  Subjective history: pt denies room spinning  Non-Vestibular symptoms: changes in hearing and changes in vision  Type of dizziness: Diplopia, Imbalance (Disequilibrium), and Lightheadedness/Faint   Progression of symptoms: better  OCULOMOTOR EXAM:  Ocular  Alignment: normal  Ocular ROM: No Limitations  Spontaneous Nystagmus: absent  Gaze-Induced Nystagmus: absent  Smooth Pursuits: intact  Saccades: dysmetria  Convergence/Divergence: 6 cm  Pts pupils do not respond correctly to light, difficulty with peripheral vision reported   VESTIBULAR - OCULAR REFLEX:   Slow VOR: Normal  VOR Cancellation: Comment: normal  Head-Impulse Test: tba     POSITIONAL TESTING: Other: deferred today due to pt being medicated and no dizziness in the  last couple of weeks  FUNCTIONAL GAIT:  11/22/23: Functional gait assessment: 22 / 30 = 73.3 % Functional Gait Assessment Summary 1. GAIT LEVEL SURFACE: Normal -- gait level surface (3)  (3 points) 2. CHANGE IN GAIT SPEED: Mild impairment -- change in gait speed (2)  (2 points) 3. GAIT WITH HORIZONTAL HEAD TURNS: Mild impairment -- gait with horizontal head turns (2)  (2 points) 4. GAIT WITH VERTICAL HEAD TURNS: Mild impairment -- gait with vertical head turns (2)  (2 points) 5. GAIT AND PIVOT TURN: Normal -- gait and pivot turn (3)  (3 points) 6. STEP OVER OBSTACLE: Mild impairment -- step over obstacle (2)  (2 points) 7. GAIT WITH NARROW BASE OF SUPPORT: Severe impairment -- gait with narrow base of support (0)  (0 points) 8. GAIT WITH EYES CLOSED: Mild impairment -- gait with eyes closed (2)  (2 points) 9. AMBULATING BACKWARDS: Normal -- ambulating backwards (3)  (3 points) 10. STEPS: Normal -- up and down steps (3)  (3 points) Functional Gait Assessment: 22/30=73.3 percent.                                                                                                                            TREATMENT DATE:  11/22/2023  VOR testing, FGA   Neuromuscular Re-education: -Speed step ups, bouts of 30 seconds, 9.5 reps, 12 reps -Aeromat walks, 3 laps, tandem/lateral stepping, pt cued for decreased UE support -Tidal tank STS, 2 sets of 10 reps, pt cued for eccentric control -Tidal tank  march, 2 laps on 20 foot line, pt cued for increased hip flexion    11/09/2023   Evaluation: -ROM measured, Strength assessed, HEP prescribed, pt educated on prognosis, findings, and importance of HEP compliance if given.   Canalith Repositioning:  Comment: TBA   PATIENT EDUCATION: Education details: Pt was educated on findings of PT evaluation, prognosis, frequency of therapy visits and rationale, attendance policy, and HEP if given.   Person educated: Patient Education method: Explanation, Verbal cues, and Handouts Education comprehension: verbalized understanding, verbal cues required, and needs further education  HOME EXERCISE PROGRAM: Access Code: [email or web address] URL: [email or web address] Date: 11/10/2023 Prepared by: Lang Ada  Exercises - Heel Toe Raises with Counter Support  - 1 x daily - 7 x weekly - 3 sets - 10 reps - Standing Single Leg Stance with Counter Support  - 1 x daily - 7 x weekly - 3 sets - 10 reps  GOALS: Goals reviewed with patient? No  SHORT TERM GOALS: Target date: 11/23/23  Pt will be independent with home exercise program in order to improve balance and decrease dizziness symptoms in order to decrease fall risk and improve function at home and work. Baseline:  Goal status: INITIAL  2.  Pt will report a improvement of 25% in dizziness symptoms from the date of evaluation for improved quality of life. Baseline:  Goal status: INITIAL  LONG TERM GOALS: Target date: 12/07/23  Patient will have  improved ABC score of 15 percent or greater in order to demonstrate improvements in patient's balance during ADLs and functional activities.  Baseline: see objective Goal status: INITIAL  2.  Patient will demonstrate reduced falls risk as evidenced by FGA, 22/30. Baseline: see objective Goal status: INITIAL   4.  Patient will reduce perceived disability to low levels as indicated by <40 on Dizziness Handicap Inventory. Baseline: see  objective Goal status: INITIAL  5.  Patient will report 50% or better improvement in their dizziness and imbalance symptoms overall in order for patient to be able to perform ADLs and resume prior activities. Baseline:  Goal status: INITIAL    ASSESSMENT:  CLINICAL IMPRESSION: Patient continues to demonstrate decreased LLE strength, decreased gait quality and balance. Patient also demonstrates negative testing for VOR and VOR cancellization on today's date. Pt reports increased difficulty breathing for the past couple of days, O2 saturation assessed and 96% level observed. Pts LLE seems to be the limiting factor for pts balance. Pt tests above the fall risk cutoff on FGA, 22/30. Patient able to progress dynamic balance and core activation exercises today with step up variations and tidal tank activities, good performance with verbal cueing. Patient would continue to benefit from skilled physical therapy for increased endurance with ambulation, increased LLE strength, and improved balance for improved quality of life, improved independence with gait training and continued progress towards therapy goals.   Patient is a 83 y.o. male who was seen today for physical therapy evaluation and treatment for R42 (ICD-10-CM) - Dizziness. Patient demonstrates improved dizziness for about a week and a half, plan to reasses dizziness next session with pt planning on ceasing medication for more informative evaluation. Pt demonstrates decreased LE strength, abnormal gait pattern, and impaired balance. Patient also demonstrates difficulty with ambulation during today's session with decreased stride length, varied gait path and decreased velocity noted. Patient also demonstrates inability to maintain SLS balance bilaterally greater than 2 seconds. Patient requires education of role of PT, POC, and importance of HEP compliance. Patient would benefit from skilled physical therapy for decreased dizziness, increased  endurance with ambulation, increased LE strength, and balance for improved gait quality, return to higher level of function with ADLs, and progress towards therapy goals.   OBJECTIVE IMPAIRMENTS: Abnormal gait, decreased activity tolerance, decreased balance, decreased endurance, decreased mobility, difficulty walking, and decreased strength.   ACTIVITY LIMITATIONS: carrying, lifting, bending, squatting, and bed mobility  PARTICIPATION LIMITATIONS: driving, shopping, community activity, and yard work  PERSONAL FACTORS: Age, Fitness, Past/current experiences, and Time since onset of injury/illness/exacerbation are also affecting patient's functional outcome.   REHAB POTENTIAL: Fair medications seem to have dizziness symptoms subsided at this date causing increased difficulty with pin pointing cause of dizziness  CLINICAL DECISION MAKING: Evolving/moderate complexity  EVALUATION COMPLEXITY: Moderate   PLAN:  PT FREQUENCY: 2x/week  PT DURATION: 4 weeks  PLANNED INTERVENTIONS: 97110-Therapeutic exercises, 97530- Therapeutic activity, 97112- Neuromuscular re-education, 97535- Self Care, [id number]- Manual therapy, [phone]- Gait training, Patient/Family education, Balance training, Stair training, Vestibular training, and DME instructions  PLAN FOR NEXT SESSION: review goals and HEP, progress HEP where necessary, progress balance and LE strengthening, recommend [employer] or local gym membership   Lang Ada, PT, DPT [facility] Office: [phone] 2:32 PM, 11/22/23

## 2023-11-24 ENCOUNTER — Encounter (HOSPITAL_COMMUNITY)

## 2023-11-29 ENCOUNTER — Encounter (HOSPITAL_COMMUNITY)

## 2023-11-29 ENCOUNTER — Encounter (HOSPITAL_COMMUNITY): Payer: Self-pay

## 2023-11-30 DIAGNOSIS — B0089 Other herpesviral infection: Secondary | ICD-10-CM | POA: Diagnosis not present

## 2023-12-01 ENCOUNTER — Encounter (HOSPITAL_COMMUNITY)

## 2023-12-02 ENCOUNTER — Ambulatory Visit (INDEPENDENT_AMBULATORY_CARE_PROVIDER_SITE_OTHER): Admitting: Urology

## 2023-12-02 ENCOUNTER — Encounter: Payer: Self-pay | Admitting: Urology

## 2023-12-02 VITALS — BP 144/90 | HR 88

## 2023-12-02 DIAGNOSIS — N411 Chronic prostatitis: Secondary | ICD-10-CM | POA: Diagnosis not present

## 2023-12-02 DIAGNOSIS — N401 Enlarged prostate with lower urinary tract symptoms: Secondary | ICD-10-CM

## 2023-12-02 DIAGNOSIS — R351 Nocturia: Secondary | ICD-10-CM | POA: Diagnosis not present

## 2023-12-02 MED ORDER — TAMSULOSIN HCL 0.4 MG PO CAPS: 0.4000 mg | ORAL_CAPSULE | Freq: Every day | ORAL | 3 refills | Status: DC

## 2023-12-02 NOTE — Progress Notes (Signed)
 12/02/2023 1:11 PM   [name] [name] December 18, 1940 [id number]  Referring provider: Cook, Jayce G, DO [address] [place],  [place] [address]  Followup BPH    HPI: Mr Vanderheiden is a 83yo here for followup for BPH with nocturia and chronic prostatitis. No dysuria or pelvic pain. He was switched to flomax  from uroxatral  due to low blood pressure. IPSS 10 QOl 1 on flomax  0.4mg  daily. No prostatitis symptoms.    PMH: Past Medical History:  Diagnosis Date   AAA (abdominal aortic aneurysm) (HCC)    needs yearly ultrasound   Allergy    Anemia    Arthritis    Asthma    BCC (basal cell carcinoma) 08/18/1989   left shoulder blad, upper right arm, left arm beyond elbow, c&d   BCC (basal cell carcinoma) 01/31/1992   Posterior neck, curetx3, 19fu   BCC (basal cell carcinoma) 11/22/2001   mid forehead, cx3, excision, right forearm, cx3, 67fu   BCC (basal cell carcinoma) 10/09/2003   mid forehead, MOHs   BCC (basal cell carcinoma) 08/15/2008   upper left back, biopsy   BPH (benign prostatic hyperplasia)    CAD (coronary artery disease)    Cancer (HCC)    skin cancer   COPD (chronic obstructive pulmonary disease) (HCC)    Dysrhythmia    pt. states it can be fast at times   GERD (gastroesophageal reflux disease)    Glaucoma    History of acute pancreatitis 12/21/2022   HOH (hard of hearing)    Hypercholesterolemia    Hypertension    Impaired fasting glucose    Low back pain    Melanoma in situ (HCC) 10/09/2003   left chin, MOHs   MI (myocardial infarction) (HCC) 1999   Peripheral vascular disease (HCC)    AAA   SCC (squamous cell carcinoma) 07/03/2014   in situ, behind left ear, cx3, cautery, 36fu   SCC (squamous cell carcinoma) 07/03/2014   well diff, left forearm, biopsy, cx1, cautery   SCC (squamous cell carcinoma) 07/20/2017   in situ, left upper arm, cx3, 61fu   SCC (squamous cell carcinoma) 01/10/2019   in situ, left post shoulder, cx3, 49fu   SCC (squamous cell carcinoma)  11/22/2001   left forearm distal, left forearm, cx3, 79fu   SCC (squamous cell carcinoma) 10/09/2003   [place], left ear post, clear per st, right cheek clear   SCC (squamous cell carcinoma) 03/30/2004   in situ, left upper arm, cx3, 42fu   SCC (squamous cell carcinoma) 03/08/2005   in situ, right cheek, mid upper forehead, cx3, 64fu   SCC (squamous cell carcinoma) 06/08/2006   in situ, left shoulder, cx3, 43fu   SCC (squamous cell carcinoma) 05/05/2010   right inner wrist, biopsy   SCC (squamous cell carcinoma) 09/13/2013   in situ, right crown scalp, front scalp, biopsy   Thrush     Surgical History: Past Surgical History:  Procedure Laterality Date   ABDOMINAL AORTIC ENDOVASCULAR STENT GRAFT N/A 07/29/2023   Procedure: INSERTION, ENDOVASCULAR STENT GRAFT, AORTA, ABDOMINAL;  Surgeon: Gretta Lonni PARAS, MD;  Location: [facility];  Service: Vascular;  Laterality: N/A;   BACK SURGERY     x 3   CARDIAC CATHETERIZATION     angioplasty   CATARACT EXTRACTION W/PHACO  03/20/2012   Procedure: CATARACT EXTRACTION PHACO AND INTRAOCULAR LENS PLACEMENT (IOC);  Surgeon: Dow JULIANNA Burke, MD;  Location: [facility];  Service: Ophthalmology;  Laterality: Right;  CDE:  8.45  CATARACT EXTRACTION W/PHACO Left 04/02/2013   Procedure: CATARACT EXTRACTION PHACO AND INTRAOCULAR LENS PLACEMENT (IOC);  Surgeon: Dow JULIANNA Burke, MD;  Location: [facility];  Service: Ophthalmology;  Laterality: Left;  CDE:  6.50   CHOLECYSTECTOMY  2000   COLONOSCOPY  2009   repeat 5 years   ESOPHAGOGASTRODUODENOSCOPY     HERNIA REPAIR Left    inguinal   INGUINAL HERNIA REPAIR Right 03/28/2020   Procedure: Right Inguinal Herniorrhaphy with Mesh;  Surgeon: Mavis Anes, MD;  Location: [facility];  Service: General;  Laterality: Right;   LAPAROSCOPIC PARTIAL COLECTOMY N/A 06/11/2013   Procedure: LAPAROSCOPIC HAND ASSISTED PARTIAL COLECTOMY;  Surgeon: Anes DELENA Mavis, MD;  Location: [facility];  Service: General;  Laterality: N/A;   NASAL  ENDOSCOPY WITH EPISTAXIS CONTROL Bilateral 02/11/2020   Procedure: NASAL ENDOSCOPY WITH EPISTAXIS CONTROL;  Surgeon: Karis Clunes, MD;  Location: [facility];  Service: ENT;  Laterality: Bilateral;   REPAIR ILIAC ARTERY Right 07/29/2023   Procedure: REPAIR, ARTERY, FEMORAL;  Surgeon: Gretta Lonni PARAS, MD;  Location: [facility];  Service: Vascular;  Laterality: Right;   right eye detached retina Bilateral    SPINAL FUSION  2016   ULTRASOUND GUIDANCE FOR VASCULAR ACCESS Bilateral 07/29/2023   Procedure: ULTRASOUND GUIDANCE, FOR VASCULAR ACCESS;  Surgeon: Gretta Lonni PARAS, MD;  Location: [facility];  Service: Vascular;  Laterality: Bilateral;   YAG LASER APPLICATION Left 05/06/2014   Procedure: YAG LASER APPLICATION;  Surgeon: Dow JULIANNA Burke, MD;  Location: [facility];  Service: Ophthalmology;  Laterality: Left;    Home Medications:  Allergies as of 12/02/2023       Reactions   Bactrim  [sulfamethoxazole -trimethoprim ] Hives   Beta Adrenergic Blockers Diarrhea, Other (See Comments)   Lasix  [furosemide ] Rash   Doxycycline  Swelling   Lips swelling and skin peeling around mouth   Myrbetriq  [mirabegron ] Hives, Itching   Ciprofloxacin  Nausea And Vomiting, Rash, Other (See Comments)   Body aches   Dexamethasone  Swelling   Gabapentin  Swelling   Methocarbamol Swelling, Rash   Neomycin Swelling, Rash   Penicillins Swelling, Rash      Tetracyclines & Related Itching        Medication List        Accurate as of December 02, 2023  1:11 PM. If you have any questions, ask your nurse or doctor.          albuterol  108 (90 Base) MCG/ACT inhaler Commonly known as: VENTOLIN  HFA Inhale 2 puffs into the lungs every 4 (four) hours as needed for wheezing or shortness of breath.   albuterol  (2.5 MG/3ML) 0.083% nebulizer solution Commonly known as: PROVENTIL  Take 3 mLs (2.5 mg total) by nebulization every 6 (six) hours as needed for wheezing or shortness of breath.   aspirin  EC 81 MG  tablet Take 1 tablet (81 mg total) by mouth daily at 6 (six) AM. Swallow whole.   cetirizine 10 MG tablet Commonly known as: ZYRTEC Take 10 mg by mouth daily.   Combigan  0.2-0.5 % ophthalmic solution Generic drug: brimonidine -timolol  Apply 1 drop to eye 2 (two) times daily.   esomeprazole  40 MG capsule Commonly known as: NexIUM  Take 30-60 min before first meal of the day   famotidine  20 MG tablet Commonly known as: Pepcid  One after supper   fluticasone -salmeterol 230-21 MCG/ACT inhaler Commonly known as: Advair  HFA Take 2 puffs first thing in am and then another 2 puffs about 12 hours later.   LORazepam  0.5 MG tablet Commonly known as: ATIVAN  Take  1 tablet (0.5 mg total) by mouth at bedtime as needed.   losartan  25 MG tablet Commonly known as: COZAAR  Take 1 tablet (25 mg total) by mouth daily.   lovastatin  40 MG tablet Commonly known as: MEVACOR  Take 1 tablet (40 mg total) by mouth daily.   metroNIDAZOLE  0.75 % gel Commonly known as: METROGEL  Apply 1 Application topically daily as needed (rosacea).   oxymetazoline  0.05 % nasal spray Commonly known as: AFRIN Place 1 spray into both nostrils 2 (two) times daily as needed for congestion.   Spacer/Aero-Holding [employer] Use as directed   tamsulosin  0.4 MG Caps capsule Commonly known as: FLOMAX  Take 1 capsule (0.4 mg total) by mouth daily.   triamcinolone  cream 0.1 % Commonly known as: KENALOG  Apply 1 Application topically 2 (two) times daily as needed (itching).        Allergies:  Allergies  Allergen Reactions   Bactrim  [Sulfamethoxazole -Trimethoprim ] Hives   Beta Adrenergic Blockers Diarrhea and Other (See Comments)   Lasix  [Furosemide ] Rash   Doxycycline  Swelling    Lips swelling and skin peeling around mouth   Myrbetriq  [Mirabegron ] Hives and Itching   Ciprofloxacin  Nausea And Vomiting, Rash and Other (See Comments)    Body aches    Dexamethasone  Swelling   Gabapentin  Swelling   Methocarbamol  Swelling and Rash   Neomycin Swelling and Rash   Penicillins Swelling and Rash        Tetracyclines & Related Itching    Family History: Family History  Problem Relation Age of Onset   Hypertension Mother    COPD Father    Cancer Brother        brain    Social History:  reports that he quit smoking about 20 years ago. His smoking use included cigarettes. He started smoking about 55 years ago. He has a 35 pack-year smoking history. He has never used smokeless tobacco. He reports that he does not drink alcohol and does not use drugs.  ROS: All other review of systems were reviewed and are negative except what is noted above in HPI  Physical Exam: BP (!) 144/90   Pulse 88   Constitutional:  Alert and oriented, No acute distress. HEENT: [employer] AT, moist mucus membranes.  Trachea midline, no masses. Cardiovascular: No clubbing, cyanosis, or edema. Respiratory: Normal respiratory effort, no increased work of breathing. GI: Abdomen is soft, nontender, nondistended, no abdominal masses GU: No CVA tenderness.  Lymph: No cervical or inguinal lymphadenopathy. Skin: No rashes, bruises or suspicious lesions. Neurologic: Grossly intact, no focal deficits, moving all 4 extremities. Psychiatric: Normal mood and affect.  Laboratory Data: Lab Results  Component Value Date   WBC 7.8 10/20/2023   HGB 14.4 10/20/2023   HCT 44.9 10/20/2023   MCV 88 10/20/2023   PLT 178 10/20/2023    Lab Results  Component Value Date   CREATININE 0.79 08/30/2023    Lab Results  Component Value Date   PSA 1.80 07/26/2014   PSA 1.42 03/26/2013    No results found for: TESTOSTERONE  Lab Results  Component Value Date   HGBA1C 6.0 (H) 08/30/2023    Urinalysis    Component Value Date/Time   COLORURINE YELLOW 07/22/2023 1443   APPEARANCEUR Clear 08/05/2023 1341   LABSPEC 1.013 07/22/2023 1443   PHURINE 7.0 07/22/2023 1443   GLUCOSEU Negative 08/05/2023 1341   HGBUR NEGATIVE 07/22/2023 1443    BILIRUBINUR Negative 08/05/2023 1341   KETONESUR NEGATIVE 07/22/2023 1443   PROTEINUR Negative 08/05/2023 1341  PROTEINUR NEGATIVE 07/22/2023 1443   UROBILINOGEN 0.2 03/29/2023 1512   UROBILINOGEN 0.2 12/04/2014 0240   NITRITE Negative 08/05/2023 1341   NITRITE NEGATIVE 07/22/2023 1443   LEUKOCYTESUR Negative 08/05/2023 1341   LEUKOCYTESUR NEGATIVE 07/22/2023 1443    Lab Results  Component Value Date   LABMICR Comment 08/05/2023   WBCUA None seen 04/30/2020   LABEPIT None seen 04/30/2020   BACTERIA None seen 04/30/2020    Pertinent Imaging:  Results for orders placed during the hospital encounter of 11/11/14  DG Abd 1 View  Narrative CLINICAL DATA:  Difficulty urinating with burning and pelvic pressure for 6 days.  EXAM: ABDOMEN - 1 VIEW  COMPARISON:  None.  FINDINGS: The bowel gas pattern is nonobstructive. Prominent stool burden in the transverse colon is noted. No unexpected abdominal calcification is seen. The patient is status post lower lumbar fusion.  IMPRESSION: No acute abnormality.   Electronically Signed By: Debby Prader M.D. On: 11/11/2014 17:46  Results for orders placed in visit on 03/30/22  US  Venous Img Lower Bilateral  Narrative CLINICAL DATA:  Bilateral lower extremity edema  EXAM: BILATERAL LOWER EXTREMITY VENOUS DOPPLER ULTRASOUND  TECHNIQUE: Gray-scale sonography with graded compression, as well as color Doppler and duplex ultrasound were performed to evaluate the lower extremity deep venous systems from the level of the common femoral vein and including the common femoral, femoral, profunda femoral, popliteal and calf veins including the posterior tibial, peroneal and gastrocnemius veins when visible. The superficial great saphenous vein was also interrogated. Spectral Doppler was utilized to evaluate flow at rest and with distal augmentation maneuvers in the common femoral, femoral and popliteal veins.  COMPARISON:  None  Available.  FINDINGS: RIGHT LOWER EXTREMITY  Common Femoral Vein: No evidence of thrombus. Normal compressibility, respiratory phasicity and response to augmentation.  Saphenofemoral Junction: No evidence of thrombus. Normal compressibility and flow on color Doppler imaging.  Profunda Femoral Vein: No evidence of thrombus. Normal compressibility and flow on color Doppler imaging.  Femoral Vein: No evidence of thrombus. Normal compressibility, respiratory phasicity and response to augmentation.  Popliteal Vein: No evidence of thrombus. Normal compressibility, respiratory phasicity and response to augmentation.  Calf Veins: No evidence of thrombus. Normal compressibility and flow on color Doppler imaging.  Superficial Great Saphenous Vein: No evidence of thrombus. Normal compressibility.  Venous Reflux:  None.  Other Findings:  None.  LEFT LOWER EXTREMITY  Common Femoral Vein: No evidence of thrombus. Normal compressibility, respiratory phasicity and response to augmentation.  Saphenofemoral Junction: No evidence of thrombus. Normal compressibility and flow on color Doppler imaging.  Profunda Femoral Vein: No evidence of thrombus. Normal compressibility and flow on color Doppler imaging.  Femoral Vein: No evidence of thrombus. Normal compressibility, respiratory phasicity and response to augmentation.  Popliteal Vein: No evidence of thrombus. Normal compressibility, respiratory phasicity and response to augmentation.  Calf Veins: No evidence of thrombus. Normal compressibility and flow on color Doppler imaging.  Superficial Great Saphenous Vein: No evidence of thrombus. Normal compressibility.  Venous Reflux:  None.  Other Findings:  None.  IMPRESSION: No evidence of deep venous thrombosis in either lower extremity.   Electronically Signed By: Wilkie Lent M.D. On: 03/31/2022 09:45  No results found for this or any previous visit.  No results  found for this or any previous visit.  No results found for this or any previous visit.  No results found for this or any previous visit.  No results found for this or any previous visit.  No results found for this or any previous visit.   Assessment & Plan:    1. BPH associated with nocturia (Primary) -continue flomax  0.4mg    2. Nocturia Continue flomax  0.4mg  daily  3. Chronic prostatitis without hematuria -resolved   No follow-ups on file.  Belvie Clara, MD  [facility] [place]

## 2023-12-02 NOTE — Patient Instructions (Signed)

## 2023-12-03 ENCOUNTER — Other Ambulatory Visit: Payer: Self-pay | Admitting: Pulmonary Disease

## 2023-12-07 ENCOUNTER — Other Ambulatory Visit: Payer: Self-pay | Admitting: Family Medicine

## 2023-12-07 ENCOUNTER — Telehealth: Payer: Self-pay | Admitting: Family Medicine

## 2023-12-07 DIAGNOSIS — J449 Chronic obstructive pulmonary disease, unspecified: Secondary | ICD-10-CM

## 2023-12-07 NOTE — Telephone Encounter (Signed)
 Spoke with patient to schedule his AWV  He asked about the status of his referral to a pulmonologist not located in [place], [place]  Thank you,  Corean,  AMB Clinical Support [facility] AWV Program Direct Dial ??[phone]

## 2023-12-08 ENCOUNTER — Ambulatory Visit: Admitting: Internal Medicine

## 2023-12-09 ENCOUNTER — Other Ambulatory Visit (HOSPITAL_COMMUNITY): Payer: Self-pay

## 2023-12-09 ENCOUNTER — Ambulatory Visit (INDEPENDENT_AMBULATORY_CARE_PROVIDER_SITE_OTHER): Admitting: Pulmonary Disease

## 2023-12-09 ENCOUNTER — Telehealth: Payer: Self-pay

## 2023-12-09 VITALS — BP 157/96 | HR 78 | Ht 73.0 in | Wt 211.0 lb

## 2023-12-09 DIAGNOSIS — J449 Chronic obstructive pulmonary disease, unspecified: Secondary | ICD-10-CM | POA: Diagnosis not present

## 2023-12-09 DIAGNOSIS — J441 Chronic obstructive pulmonary disease with (acute) exacerbation: Secondary | ICD-10-CM | POA: Diagnosis not present

## 2023-12-09 DIAGNOSIS — Z87891 Personal history of nicotine dependence: Secondary | ICD-10-CM

## 2023-12-09 DIAGNOSIS — Q791 Other congenital malformations of diaphragm: Secondary | ICD-10-CM

## 2023-12-09 DIAGNOSIS — J4550 Severe persistent asthma, uncomplicated: Secondary | ICD-10-CM

## 2023-12-09 DIAGNOSIS — R0609 Other forms of dyspnea: Secondary | ICD-10-CM | POA: Diagnosis not present

## 2023-12-09 MED ORDER — BREZTRI AEROSPHERE 160-9-4.8 MCG/ACT IN AERO
2.0000 | INHALATION_SPRAY | Freq: Two times a day (BID) | RESPIRATORY_TRACT | 3 refills | Status: DC
Start: 2023-12-09 — End: 2024-01-18

## 2023-12-09 NOTE — Patient Instructions (Addendum)
 Let us  try Breztri  again in place of Advair   If you feel it is not working as well as Advair  then you should go back to Advair   Continue using your inhalers  You may use your inhaler prior to activities that you have consistently noticed get you short of breath  Make sure you continue to stay active  Follow-up in 3 months  Call us  with significant concerns

## 2023-12-09 NOTE — Telephone Encounter (Signed)
*  Pulm  Pharmacy Patient Advocate Encounter   Received notification from CoverMyMeds that prior authorization for Breztri  Aerosphere 160-9-4.8MCG/ACT aerosol  is required/requested.   Insurance verification completed.   The patient is insured through [employer] .   Per test claim:  Trelegy Ellipta  is preferred by the insurance.  If suggested medication is appropriate, Please send in a new RX and discontinue this one. If not, please advise as to why it's not appropriate so that we may request a Prior Authorization. Please note, some preferred medications may still require a PA.  If the suggested medications have not been trialed and there are no contraindications to their use, the PA will not be submitted, as it will not be approved.  CMM Key: [id number]  Per test claim: Trelegy: $137.79

## 2023-12-09 NOTE — Progress Notes (Signed)
 [name] [name]    [id number]    Jul 21, 1940  Primary Care Physician:Cook, Jayce G, DO  Referring Physician: Cook, Jayce G, DO [address] [place],  [place] [address]  Chief complaint:   Patient being seen for COPD  HPI:  History of obstructive lung disease, currently on Advair  HFA Did try Breztri  at some point Has tried other inhalers as well  Shortness of breath on exertion  Subjectively is getting more short of breath than others  Quit smoking in 2004, 35-pack-year smoking history  Does have occasional cough, cough is mostly dry Albuterol  use as needed both an MDI and nebulizer  He does try to stay active  Triggers for his shortness of breath include weather changes, exposure to dust/fumes  Pulmonary rehab in 2022   Outpatient Encounter Medications as of 12/09/2023  Medication Sig   albuterol  (PROVENTIL ) (2.5 MG/3ML) 0.083% nebulizer solution Take 3 mLs (2.5 mg total) by nebulization every 6 (six) hours as needed for wheezing or shortness of breath.   albuterol  (VENTOLIN  HFA) 108 (90 Base) MCG/ACT inhaler Inhale 2 puffs into the lungs every 4 (four) hours as needed for wheezing or shortness of breath.   aspirin  EC 81 MG tablet Take 1 tablet (81 mg total) by mouth daily at 6 (six) AM. Swallow whole.   cetirizine (ZYRTEC) 10 MG tablet Take 10 mg by mouth daily.   COMBIGAN  0.2-0.5 % ophthalmic solution Apply 1 drop to eye 2 (two) times daily.   esomeprazole  (NEXIUM ) 40 MG capsule Take 30-60 min before first meal of the day   famotidine  (PEPCID ) 20 MG tablet One after supper   fluticasone -salmeterol (ADVAIR  HFA) 230-21 MCG/ACT inhaler Take 2 puffs first thing in am and then another 2 puffs about 12 hours later.   LORazepam  (ATIVAN ) 0.5 MG tablet Take 1 tablet (0.5 mg total) by mouth at bedtime as needed.   losartan  (COZAAR ) 25 MG tablet Take 1 tablet (25 mg total) by mouth daily.   lovastatin  (MEVACOR ) 40 MG tablet Take 1 tablet (40 mg total) by mouth daily.    metroNIDAZOLE  (METROGEL ) 0.75 % gel Apply 1 Application topically daily as needed (rosacea).   oxymetazoline  (AFRIN) 0.05 % nasal spray Place 1 spray into both nostrils 2 (two) times daily as needed for congestion.   Spacer/Aero-Holding Raguel FRENCH Use as directed   tamsulosin  (FLOMAX ) 0.4 MG CAPS capsule Take 1 capsule (0.4 mg total) by mouth daily.   triamcinolone  cream (KENALOG ) 0.1 % Apply 1 Application topically 2 (two) times daily as needed (itching).   No facility-administered encounter medications on file as of 12/09/2023.    Allergies as of 12/09/2023 - Review Complete 12/09/2023  Allergen Reaction Noted   Bactrim  [sulfamethoxazole -trimethoprim ] Hives 07/13/2022   Beta adrenergic blockers Diarrhea and Other (See Comments) 08/01/2012   Lasix  [furosemide ] Rash 02/20/2015   Doxycycline  Swelling 04/28/2023   Myrbetriq  [mirabegron ] Hives and Itching 05/27/2023   Ciprofloxacin  Nausea And Vomiting, Rash, and Other (See Comments) 11/04/2010   Dexamethasone  Swelling 03/27/2013   Gabapentin  Swelling 12/26/2014   Methocarbamol Swelling and Rash 03/27/2013   Neomycin Swelling and Rash 03/09/2012   Penicillins Swelling and Rash 11/04/2010   Tetracyclines & related Itching 11/04/2010    Past Medical History:  Diagnosis Date   AAA (abdominal aortic aneurysm) (HCC)    needs yearly ultrasound   Allergy    Anemia    Arthritis    Asthma    BCC (basal cell carcinoma) 08/18/1989  left shoulder blad, upper right arm, left arm beyond elbow, c&d   BCC (basal cell carcinoma) 01/31/1992   Posterior neck, curetx3, 61fu   BCC (basal cell carcinoma) 11/22/2001   mid forehead, cx3, excision, right forearm, cx3, 40fu   BCC (basal cell carcinoma) 10/09/2003   mid forehead, MOHs   BCC (basal cell carcinoma) 08/15/2008   upper left back, biopsy   BPH (benign prostatic hyperplasia)    CAD (coronary artery disease)    Cancer (HCC)    skin cancer   COPD (chronic obstructive pulmonary disease)  (HCC)    Dysrhythmia    pt. states it can be fast at times   GERD (gastroesophageal reflux disease)    Glaucoma    History of acute pancreatitis 12/21/2022   HOH (hard of hearing)    Hypercholesterolemia    Hypertension    Impaired fasting glucose    Low back pain    Melanoma in situ (HCC) 10/09/2003   left chin, MOHs   MI (myocardial infarction) (HCC) 1999   Peripheral vascular disease (HCC)    AAA   SCC (squamous cell carcinoma) 07/03/2014   in situ, behind left ear, cx3, cautery, 29fu   SCC (squamous cell carcinoma) 07/03/2014   well diff, left forearm, biopsy, cx1, cautery   SCC (squamous cell carcinoma) 07/20/2017   in situ, left upper arm, cx3, 80fu   SCC (squamous cell carcinoma) 01/10/2019   in situ, left post shoulder, cx3, 37fu   SCC (squamous cell carcinoma) 11/22/2001   left forearm distal, left forearm, cx3, 32fu   SCC (squamous cell carcinoma) 10/09/2003   [place], left ear post, clear per st, right cheek clear   SCC (squamous cell carcinoma) 03/30/2004   in situ, left upper arm, cx3, 12fu   SCC (squamous cell carcinoma) 03/08/2005   in situ, right cheek, mid upper forehead, cx3, 58fu   SCC (squamous cell carcinoma) 06/08/2006   in situ, left shoulder, cx3, 52fu   SCC (squamous cell carcinoma) 05/05/2010   right inner wrist, biopsy   SCC (squamous cell carcinoma) 09/13/2013   in situ, right crown scalp, front scalp, biopsy   Thrush     Past Surgical History:  Procedure Laterality Date   ABDOMINAL AORTIC ENDOVASCULAR STENT GRAFT N/A 07/29/2023   Procedure: INSERTION, ENDOVASCULAR STENT GRAFT, AORTA, ABDOMINAL;  Surgeon: Gretta Lonni PARAS, MD;  Location: [facility];  Service: Vascular;  Laterality: N/A;   BACK SURGERY     x 3   CARDIAC CATHETERIZATION     angioplasty   CATARACT EXTRACTION W/PHACO  03/20/2012   Procedure: CATARACT EXTRACTION PHACO AND INTRAOCULAR LENS PLACEMENT (IOC);  Surgeon: Dow JULIANNA Burke, MD;  Location: [facility];  Service: Ophthalmology;   Laterality: Right;  CDE:  8.45   CATARACT EXTRACTION W/PHACO Left 04/02/2013   Procedure: CATARACT EXTRACTION PHACO AND INTRAOCULAR LENS PLACEMENT (IOC);  Surgeon: Dow JULIANNA Burke, MD;  Location: [facility];  Service: Ophthalmology;  Laterality: Left;  CDE:  6.50   CHOLECYSTECTOMY  2000   COLONOSCOPY  2009   repeat 5 years   ESOPHAGOGASTRODUODENOSCOPY     HERNIA REPAIR Left    inguinal   INGUINAL HERNIA REPAIR Right 03/28/2020   Procedure: Right Inguinal Herniorrhaphy with Mesh;  Surgeon: Mavis Anes, MD;  Location: [facility];  Service: General;  Laterality: Right;   LAPAROSCOPIC PARTIAL COLECTOMY N/A 06/11/2013   Procedure: LAPAROSCOPIC HAND ASSISTED PARTIAL COLECTOMY;  Surgeon: Anes DELENA Mavis, MD;  Location: [facility];  Service: General;  Laterality: N/A;  NASAL ENDOSCOPY WITH EPISTAXIS CONTROL Bilateral 02/11/2020   Procedure: NASAL ENDOSCOPY WITH EPISTAXIS CONTROL;  Surgeon: Karis Clunes, MD;  Location: [facility];  Service: ENT;  Laterality: Bilateral;   REPAIR ILIAC ARTERY Right 07/29/2023   Procedure: REPAIR, ARTERY, FEMORAL;  Surgeon: Gretta Lonni PARAS, MD;  Location: [facility] OR;  Service: Vascular;  Laterality: Right;   right eye detached retina Bilateral    SPINAL FUSION  2016   ULTRASOUND GUIDANCE FOR VASCULAR ACCESS Bilateral 07/29/2023   Procedure: ULTRASOUND GUIDANCE, FOR VASCULAR ACCESS;  Surgeon: Gretta Lonni PARAS, MD;  Location: [facility] OR;  Service: Vascular;  Laterality: Bilateral;   YAG LASER APPLICATION Left 05/06/2014   Procedure: YAG LASER APPLICATION;  Surgeon: Dow JULIANNA Burke, MD;  Location: [facility];  Service: Ophthalmology;  Laterality: Left;    Family History  Problem Relation Age of Onset   Hypertension Mother    COPD Father    Cancer Brother        brain    Social History   Socioeconomic History   Marital status: Widowed    Spouse name: Not on file   Number of children: Not on file   Years of education: Not on file   Highest education level: Not on  file  Occupational History   Not on file  Tobacco Use   Smoking status: Former    Current packs/day: 0.00    Average packs/day: 1 pack/day for 35.0 years (35.0 ttl pk-yrs)    Types: Cigarettes    Start date: 03/27/1968    Quit date: 03/28/2003    Years since quitting: 20.7   Smokeless tobacco: Never  Vaping Use   Vaping status: Never Used  Substance and Sexual Activity   Alcohol use: No    Alcohol/week: 0.0 standard drinks of alcohol   Drug use: No   Sexual activity: Not Currently    Birth control/protection: None  Other Topics Concern   Not on file  Social History Narrative   Not on file   Social Drivers of Health   Financial Resource Strain: Low Risk  (03/18/2022)   Overall Financial Resource Strain (CARDIA)    Difficulty of Paying Living Expenses: Not very hard  Food Insecurity: No Food Insecurity (08/01/2023)   Hunger Vital Sign    Worried About Running Out of Food in the Last Year: Never true    Ran Out of Food in the Last Year: Never true  Transportation Needs: No Transportation Needs (08/01/2023)   PRAPARE - Administrator, Civil Service (Medical): No    Lack of Transportation (Non-Medical): No  Physical Activity: Insufficiently Active (03/18/2022)   Exercise Vital Sign    Days of Exercise per Week: 2 days    Minutes of Exercise per Session: 20 min  Stress: No Stress Concern Present (03/18/2022)   [employer] of Occupational Health - Occupational Stress Questionnaire    Feeling of Stress : Not at all  Social Connections: Socially Isolated (07/30/2023)   Social Connection and Isolation Panel    Frequency of Communication with Friends and Family: More than three times a week    Frequency of Social Gatherings with Friends and Family: Three times a week    Attends Religious Services: Never    Active Member of Clubs or Organizations: No    Attends Banker Meetings: Never    Marital Status: Widowed  Intimate Partner Violence: Not At Risk  (08/01/2023)   Humiliation, Afraid, Rape, and Kick questionnaire  Fear of Current or Ex-Partner: No    Emotionally Abused: No    Physically Abused: No    Sexually Abused: No    Review of Systems  Respiratory:  Positive for cough and shortness of breath.     Vitals:   12/09/23 1115  BP: (!) 157/96  Pulse: 78  SpO2: 96%     Physical Exam Constitutional:      Appearance: He is obese.  HENT:     Head: Normocephalic.     Mouth/Throat:     Mouth: Mucous membranes are moist.  Eyes:     General: No scleral icterus.    Pupils: Pupils are equal, round, and reactive to light.  Cardiovascular:     Rate and Rhythm: Normal rate and regular rhythm.     Heart sounds: No murmur heard.    No friction rub.  Pulmonary:     Effort: No respiratory distress.     Breath sounds: No stridor. No wheezing, rhonchi or rales.     Comments: Decreased air movement at the bases, no rales, no rhonchi Musculoskeletal:     Cervical back: No rigidity or tenderness.  Lymphadenopathy:     Cervical: No cervical adenopathy.  Neurological:     Mental Status: He is alert.  Psychiatric:        Mood and Affect: Mood normal.    Data Reviewed: PFT 11/05/2019-severe obstructive disease with FEV1 at 43%  Last CT chest is 11/19/2016 showing evidence of emphysema  Assessment:  Severe chronic obstructive pulmonary disease, Gold 3 COPD - Trial with Breztri  in place of Advair   Dyspnea on exertion  Encourage graded activity as tolerated  Regular exercise as tolerated  Hypertension  Cervical radiculopathy   Plan/Recommendations: Samples of Breztri  provided  Prescription for Breztri  provided  Graded activities as tolerated  Follow-up in about 3 months  Encouraged to call with significant concerns   Jennet Epley MD [facility] 12/09/2023, 11:33 AM  CC: Cook, Jayce G, DO

## 2023-12-13 ENCOUNTER — Ambulatory Visit: Payer: Self-pay

## 2023-12-13 ENCOUNTER — Encounter: Payer: Self-pay | Admitting: Orthopedic Surgery

## 2023-12-13 ENCOUNTER — Ambulatory Visit (INDEPENDENT_AMBULATORY_CARE_PROVIDER_SITE_OTHER): Admitting: Orthopedic Surgery

## 2023-12-13 DIAGNOSIS — M1711 Unilateral primary osteoarthritis, right knee: Secondary | ICD-10-CM

## 2023-12-13 MED ORDER — METHYLPREDNISOLONE ACETATE 40 MG/ML IJ SUSP
40.0000 mg | Freq: Once | INTRAMUSCULAR | Status: AC
  Administered 2023-12-13: 40 mg via INTRA_ARTICULAR

## 2023-12-13 NOTE — Patient Instructions (Signed)

## 2023-12-13 NOTE — Telephone Encounter (Signed)
 Trelegy is preferred-has patient tried and failed this preferred alternative, or does the patient have a contraindication to this alternative?

## 2023-12-13 NOTE — Telephone Encounter (Signed)
 FYI Only or Action Required?: FYI only for provider.  Patient was last seen in primary care on 10/27/2023 by Cook, Jayce G, DO.  Called Nurse Triage reporting Leg Swelling.  Symptoms began months ago but has worsened in the past 3 days .  Interventions attempted: Nothing.  Symptoms are: gradually worsening.  Triage Disposition: See Physician Within 24 Hours  Patient/caregiver understands and will follow disposition?: yes            Copied from CRM #[id number]. Topic: Clinical - Red Word Triage >> Dec 13, 2023  8:52 AM Precious C wrote: [facility] that prompted transfer to Nurse Triage: SWELLING  Patient called in requesting an appointment. Upon attempting to schedule, the decision tree denied scheduling. Patient reported swelling in both feet and ankles, but no pain is currently present, only the swelling. Reason for Disposition  [1] MODERATE leg swelling (e.g., swelling extends up to knees) AND [2] new-onset or getting worse    Feet and ankle swelling getting worse  Answer Assessment - Initial Assessment Questions 1. ONSET: When did the swelling start? (e.g., minutes, hours, days)     Months worse past 3 days  2. LOCATION: What part of the leg is swollen?  Are both legs swollen or just one leg?     Both feet and ankles  3. SEVERITY: How bad is the swelling? (e.g., localized; mild, moderate, severe)     Mild  4. REDNESS: Is there redness or signs of infection?     no 5. PAIN: Is the swelling painful to touch? If Yes, ask: How painful is it?   (Scale 1-10; mild, moderate or severe)     Yes  6. FEVER: Do you have a fever? If Yes, ask: What is it, how was it measured, and when did it start?      no 7. CAUSE: What do you think is causing the leg swelling?     Unsure  9. RECURRENT SYMPTOM: Have you had leg swelling before? If Yes, ask: When was the last time? What happened that time?     yes 10. OTHER SYMPTOMS: Do you have any other symptoms?  (e.g., chest pain, difficulty breathing)      COPD so has difficulty breathing  Protocols used: Leg Swelling and Edema-A-AH

## 2023-12-13 NOTE — Progress Notes (Signed)
 Return patient Visit  Assessment: [name] [name] is a 83 y.o. male with the following: 1. Arthritis of right knee  Plan: GLYNDON TURSI has advanced degenerative changes in the right knee.  Injections continue to provide relief.  He started to have pain in the right knee about a week ago.  He is interested in repeat injection.  This was completed in clinic today.   Procedure note injection Right knee joint   Verbal consent was obtained to inject the right knee joint  Timeout was completed to confirm the site of injection.  The skin was prepped with alcohol and ethyl chloride was sprayed at the injection site.  A 21-gauge needle was used to inject 40 mg of Depo-Medrol  and 1% lidocaine  (3 cc) into the right knee using an anterolateral approach.  There were no complications. A sterile bandage was applied.   Follow-up: Return if symptoms worsen or fail to improve.  Subjective:  Chief Complaint  Patient presents with   Knee Pain    Right     History of Present Illness: [name] [name] is a 83 y.o. male who returns for evaluation of right knee pain.  He has chronic knee pain.  He has known arthritis.  I saw him in clinic a little over 3 months ago.  At that time, his right knee was injected with steroid.  He had improvement in his symptoms to about a week ago.  He is interested in another injection.  He is also asking about hyaluronic acid injections again.  Review of Systems: No fevers or chills No numbness or tingling No chest pain No shortness of breath No bowel or bladder dysfunction No GI distress No headaches   Objective: There were no vitals taken for this visit.  Physical Exam:  General: Elderly male., Alert and oriented., and No acute distress. Gait: Right sided antalgic gait.  Right knee with mild varus alignment overall.  Tenderness to palpation over the medial joint line.  No effusion.  Range of motion from 3-120 degrees without discomfort.  No increased laxity  varus or valgus stress.  Negative Lachman.  Toes are warm and well-perfused.  IMAGING: No new imaging obtained today   New Medications:  No orders of the defined types were placed in this encounter.     Oneil DELENA Horde, MD  12/13/2023 4:01 PM

## 2023-12-14 ENCOUNTER — Ambulatory Visit (INDEPENDENT_AMBULATORY_CARE_PROVIDER_SITE_OTHER): Admitting: Family Medicine

## 2023-12-14 ENCOUNTER — Encounter (HOSPITAL_COMMUNITY)

## 2023-12-14 VITALS — BP 151/89 | HR 74 | Temp 98.4°F | Ht 73.0 in | Wt 212.0 lb

## 2023-12-14 DIAGNOSIS — R251 Tremor, unspecified: Secondary | ICD-10-CM

## 2023-12-14 DIAGNOSIS — R600 Localized edema: Secondary | ICD-10-CM | POA: Diagnosis not present

## 2023-12-14 NOTE — Patient Instructions (Addendum)
 Elevate legs. Consider compression. Consider diuretic.  Lab today.  Decrease Albuterol  use if you can.

## 2023-12-15 ENCOUNTER — Ambulatory Visit: Payer: Self-pay | Admitting: Family Medicine

## 2023-12-15 DIAGNOSIS — R251 Tremor, unspecified: Secondary | ICD-10-CM | POA: Insufficient documentation

## 2023-12-15 DIAGNOSIS — R600 Localized edema: Secondary | ICD-10-CM | POA: Insufficient documentation

## 2023-12-15 LAB — BRAIN NATRIURETIC PEPTIDE: BNP: 68.7 pg/mL (ref 0.0–100.0)

## 2023-12-15 NOTE — Progress Notes (Signed)
 Subjective:  Patient ID: [name] [name], male    DOB: 1940/08/06  Age: 83 y.o. MRN: [id number]  CC: Lower leg swelling   HPI:  83 year old male presents for evaluation of the above.  Patient reports that he is having ongoing swelling in his lower extremities.  Seems to be worsening.  He is unsure why.  Recent BMP last month was negative.  He has baseline shortness of breath secondary to COPD.  Patient also reports that he has times where he feels tremorous.  He is concerned that his medications may be causing this.  He does use albuterol  frequently.  Will discuss this today.  Patient Active Problem List   Diagnosis Date Noted   Lower extremity edema 12/15/2023   Tremulousness 12/15/2023   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   DOE (dyspnea on exertion) 10/20/2023   Cervical radiculopathy 08/15/2023   BPH associated with nocturia 05/24/2023   COPD GOLD 3/ AB 05/05/2023   Celiac artery stenosis (HCC) 02/02/2023   Chronic idiopathic constipation 12/21/2022   Bilateral primary osteoarthritis of knee 04/14/2022   Irritable bowel syndrome 03/17/2022   Recurrent right lower quadrant abdominal pain 03/03/2022   Seasonal allergies 08/06/2021   CAD (coronary artery disease) 04/23/2021   Hypertension 09/16/2016   Lumbar spondylosis 11/05/2014   GERD (gastroesophageal reflux disease) 01/19/2012   Hyperlipidemia 04/15/2008   AAA (abdominal aortic aneurysm) (HCC) 04/15/2008    Social Hx   Social History   Socioeconomic History   Marital status: Widowed    Spouse name: Not on file   Number of children: Not on file   Years of education: Not on file   Highest education level: Not on file  Occupational History   Not on file  Tobacco Use   Smoking status: Former    Current packs/day: 0.00    Average packs/day: 1 pack/day for 35.0 years (35.0 ttl pk-yrs)    Types: Cigarettes    Start date: 03/27/1968    Quit date: 03/28/2003    Years since quitting: 20.7    Smokeless tobacco: Never  Vaping Use   Vaping status: Never Used  Substance and Sexual Activity   Alcohol use: No    Alcohol/week: 0.0 standard drinks of alcohol   Drug use: No   Sexual activity: Not Currently    Birth control/protection: None  Other Topics Concern   Not on file  Social History Narrative   Not on file   Social Drivers of Health   Financial Resource Strain: Low Risk  (03/18/2022)   Overall Financial Resource Strain (CARDIA)    Difficulty of Paying Living Expenses: Not very hard  Food Insecurity: No Food Insecurity (08/01/2023)   Hunger Vital Sign    Worried About Running Out of Food in the Last Year: Never true    Ran Out of Food in the Last Year: Never true  Transportation Needs: No Transportation Needs (08/01/2023)   PRAPARE - Administrator, Civil Service (Medical): No    Lack of Transportation (Non-Medical): No  Physical Activity: Insufficiently Active (03/18/2022)   Exercise Vital Sign    Days of Exercise per Week: 2 days    Minutes of Exercise per Session: 20 min  Stress: No Stress Concern Present (03/18/2022)   [employer] of Occupational Health - Occupational Stress Questionnaire    Feeling of Stress : Not at all  Social Connections: Socially Isolated (07/30/2023)   Social Connection and Isolation Panel    Frequency of Communication with Friends and  Family: More than three times a week    Frequency of Social Gatherings with Friends and Family: Three times a week    Attends Religious Services: Never    Active Member of Clubs or Organizations: No    Attends Banker Meetings: Never    Marital Status: Widowed    Review of Systems Per HPI  Objective:  BP (!) 151/89   Pulse 74   Temp 98.4 F (36.9 C)   Ht 6' 1 (1.854 m)   Wt 212 lb (96.2 kg)   SpO2 98%   BMI 27.97 kg/m      12/14/2023   10:41 AM 12/09/2023   11:15 AM 12/02/2023   12:46 PM  BP/Weight  Systolic BP 151 157 144  Diastolic BP 89 96 90  Wt. (Lbs) 212  211   BMI 27.97 kg/m2 27.84 kg/m2     Physical Exam Vitals and nursing note reviewed.  Constitutional:      General: He is not in acute distress.    Appearance: Normal appearance.  HENT:     Head: Normocephalic and atraumatic.  Cardiovascular:     Rate and Rhythm: Normal rate and regular rhythm.     Comments: 1+ lower extremity edema. Pulmonary:     Effort: Pulmonary effort is normal.     Breath sounds: No wheezing.  Neurological:     Mental Status: He is alert.  Psychiatric:        Mood and Affect: Mood normal.        Behavior: Behavior normal.     Lab Results  Component Value Date   WBC 7.8 10/20/2023   HGB 14.4 10/20/2023   HCT 44.9 10/20/2023   PLT 178 10/20/2023   GLUCOSE 95 08/30/2023   CHOL 134 08/30/2023   TRIG 115 08/30/2023   HDL 45 08/30/2023   LDLCALC 68 08/30/2023   ALT 12 08/30/2023   AST 16 08/30/2023   NA 141 08/30/2023   K 4.5 08/30/2023   CL 101 08/30/2023   CREATININE 0.79 08/30/2023   BUN 11 08/30/2023   CO2 25 08/30/2023   TSH 0.961 08/30/2023   PSA 1.80 07/26/2014   INR 1.1 07/29/2023   HGBA1C 6.0 (H) 08/30/2023     Assessment & Plan:  Lower extremity edema Assessment & Plan: Multifactorial.  Varicosities and age contributing.  Losartan  could be playing a role as I have seen this cause lower extremity edema before.  Discussed changing to thiazide diuretic.  Advised compression and elevation.  Patient wants to wait on changing his medication at this time and monitor.  Obtaining BNP.  Orders: -     Brain natriuretic peptide  Tremulousness Assessment & Plan: Well-appearing on exam today.  Advised to cut down on albuterol  use and monitor.  If continues to persist will proceed with further workup and/or neurology referral.     Follow-up:  Return if symptoms worsen or fail to improve.  Jacqulyn Ahle DO [facility]

## 2023-12-15 NOTE — Assessment & Plan Note (Signed)
 Well-appearing on exam today.  Advised to cut down on albuterol  use and monitor.  If continues to persist will proceed with further workup and/or neurology referral.

## 2023-12-15 NOTE — Assessment & Plan Note (Signed)
 Multifactorial.  Varicosities and age contributing.  Losartan  could be playing a role as I have seen this cause lower extremity edema before.  Discussed changing to thiazide diuretic.  Advised compression and elevation.  Patient wants to wait on changing his medication at this time and monitor.  Obtaining BNP.

## 2023-12-16 ENCOUNTER — Encounter (HOSPITAL_COMMUNITY)

## 2023-12-20 ENCOUNTER — Telehealth: Payer: Self-pay

## 2023-12-20 NOTE — Telephone Encounter (Signed)
 Copied from CRM [id number]. Topic: Clinical - Medical Advice >> Dec 20, 2023  9:15 AM [name] wrote: Reason for CRM: Patient states that his Breztri  keeping him from sleeping night and causing nervousness, as well as excessive urination. Please call patient back and advise on if he should continue to take the medication.  Spoke with patient regarding prior message. Patient started Breztri  is keeping him up at night.Patient stated he takes Breztri  at 9 a and 8pm.Patient stated he is unsure if Breztri  is helping and not difference in his breathing. Patient stated he can wait until tomorrow Dr.Olalere is in the office.Advised patient to try and change the later time to before 8 Pm .   Dr.Olalere can you please advise

## 2023-12-22 ENCOUNTER — Telehealth: Payer: Self-pay

## 2023-12-22 NOTE — Telephone Encounter (Signed)
 Copied from CRM [id number]. Topic: General - Other >> Dec 22, 2023  1:38 PM Rilla B wrote: Reason for CRM: Patient spoke with Consuelo a couple days ago and has been waiting on a phone call back.  Please call patient at [phone] and update him on his Bretzi.

## 2023-12-23 ENCOUNTER — Telehealth (HOSPITAL_BASED_OUTPATIENT_CLINIC_OR_DEPARTMENT_OTHER): Payer: Self-pay

## 2023-12-23 ENCOUNTER — Telehealth: Payer: Self-pay

## 2023-12-23 NOTE — Telephone Encounter (Signed)
**Note De-identified  [place] Obfuscation** Please advise 

## 2023-12-23 NOTE — Telephone Encounter (Signed)
 Copied from CRM [id number]. Topic: Clinical - Prescription Issue >> Dec 23, 2023 10:40 AM [name] [name] wrote: Reason for CRM: Patient states that [facility] states they are not covering his medicines because office has not returned a fax regarding PA of Breztri  or Albuterol . Patient is request this be returned so he can get his medicine and would like to know why it was not done in the first place

## 2023-12-23 NOTE — Telephone Encounter (Signed)
 Spoke with patient regarding prior message. Patient is going to pick up samples of Trelegy 100 on Monday and try them.Patient's insurance does recommend Trelelgy Patient's voice was understanding. Nothing else further needed.

## 2023-12-23 NOTE — Telephone Encounter (Signed)
 Dr Neda, have you received a questionnaire?

## 2023-12-26 DIAGNOSIS — H5022 Vertical strabismus, left eye: Secondary | ICD-10-CM | POA: Diagnosis not present

## 2023-12-26 DIAGNOSIS — H532 Diplopia: Secondary | ICD-10-CM | POA: Diagnosis not present

## 2023-12-26 DIAGNOSIS — H04123 Dry eye syndrome of bilateral lacrimal glands: Secondary | ICD-10-CM | POA: Diagnosis not present

## 2023-12-26 DIAGNOSIS — H5000 Unspecified esotropia: Secondary | ICD-10-CM | POA: Diagnosis not present

## 2023-12-26 DIAGNOSIS — H02132 Senile ectropion of right lower eyelid: Secondary | ICD-10-CM | POA: Diagnosis not present

## 2023-12-26 DIAGNOSIS — H01135 Eczematous dermatitis of left lower eyelid: Secondary | ICD-10-CM | POA: Diagnosis not present

## 2023-12-26 DIAGNOSIS — H401122 Primary open-angle glaucoma, left eye, moderate stage: Secondary | ICD-10-CM | POA: Diagnosis not present

## 2023-12-27 NOTE — Telephone Encounter (Signed)
 Did not receive a questionnaire  Myself and Consuelo Blakes did look around for it

## 2023-12-27 NOTE — Telephone Encounter (Signed)
 Per other telephone note 12/23/23, the pt has already picked up samples of trlegy and will try this for now, it is approved.    Spoke with patient regarding prior message. Patient is going to pick up samples of Trelegy 100 on Monday and try them.Patient's insurance does recommend Trelelgy Patient's voice was understanding. Nothing else further needed.

## 2023-12-30 ENCOUNTER — Other Ambulatory Visit: Payer: Self-pay | Admitting: Family Medicine

## 2023-12-30 DIAGNOSIS — E785 Hyperlipidemia, unspecified: Secondary | ICD-10-CM

## 2024-01-06 ENCOUNTER — Encounter: Payer: Self-pay | Admitting: Radiology

## 2024-01-09 ENCOUNTER — Ambulatory Visit: Payer: Self-pay

## 2024-01-09 NOTE — Telephone Encounter (Signed)
 FYI Only or Action Required?: FYI only for provider.  Patient was last seen in primary care on 12/14/2023 by Cook, Jayce G, DO.  Called Nurse Triage reporting Cough.  Symptoms began several days ago.  Interventions attempted: OTC medications: Nyquil, Alka Seltzer cold.  Symptoms are: productive cough with yellow mucus, chest sore and SOB after coughing gradually worsening.  Triage Disposition: See Physician Within 24 Hours  Patient/caregiver understands and will follow disposition?: Yes              Copied from CRM [id number]. Topic: Clinical - Red Word Triage >> Jan 09, 2024  3:40 PM Avram MATSU wrote: Red Word that prompted transfer to Nurse Triage: chest respiratory infection    ----------------------------------------------------------------------- From previous Reason for Contact - Scheduling: Patient/patient representative is calling to schedule an appointment. Refer to attachments for appointment information. Reason for Disposition  [1] Known COPD or other severe lung disease (i.e., bronchiectasis, cystic fibrosis, lung surgery) AND [2] symptoms getting worse (i.e., increased sputum purulence or amount, increased breathing difficulty  Answer Assessment - Initial Assessment Questions 1. ONSET: When did the cough begin?      2 days ago.  2. SEVERITY: How bad is the cough today?      Not constant, comes and goes and he states he coughs a little bit every hour. Chest discomfort/sore after coughing.  3. SPUTUM: Describe the color of your sputum (e.g., none, dry cough; clear, white, yellow, green)     Yellowish phlegm  4. HEMOPTYSIS: Are you coughing up any blood? If Yes, ask: How much? (e.g., flecks, streaks, tablespoons, etc.)     No.  5. DIFFICULTY BREATHING: Are you having difficulty breathing? If Yes, ask: How bad is it? (e.g., mild, moderate, severe)      He states he has COPD and it affects his breathing but he states he is okay. He states his  breathing is more labored, he states he would describe it as irritation or something going on in his lungs. No wheezing or labored breathing noted and patient speaking in full sentences.  6. FEVER: Do you have a fever? If Yes, ask: What is your temperature, how was it measured, and when did it start?     No.  7. CARDIAC HISTORY: Do you have any history of heart disease? (e.g., heart attack, congestive heart failure)       AAA (abdominal aortic aneurysm) (HCC)  Hypertension  CAD (coronary artery disease)  Celiac artery stenosis (HCC)  8. LUNG HISTORY: Do you have any history of lung disease?  (e.g., pulmonary embolus, asthma, emphysema)     COPD.  9. PE RISK FACTORS: Do you have a history of blood clots? (or: recent major surgery, recent prolonged travel, bedridden)     No.  10. OTHER SYMPTOMS: Do you have any other symptoms? (e.g., runny nose, wheezing, chest pain)       No.  11. PREGNANCY: Is there any chance you are pregnant? When was your last menstrual period?       N/A.  12. TRAVEL: Have you traveled out of the country in the last month? (e.g., travel history, exposures)       No.  Protocols used: Cough - Acute Productive-A-AH

## 2024-01-09 NOTE — Telephone Encounter (Signed)
**Note De-identified  [place] Obfuscation** Please advise 

## 2024-01-10 ENCOUNTER — Encounter: Payer: Self-pay | Admitting: Family Medicine

## 2024-01-10 ENCOUNTER — Ambulatory Visit (INDEPENDENT_AMBULATORY_CARE_PROVIDER_SITE_OTHER): Admitting: Family Medicine

## 2024-01-10 ENCOUNTER — Ambulatory Visit: Payer: Self-pay

## 2024-01-10 VITALS — BP 126/79 | HR 85 | Temp 98.1°F | Ht 73.0 in | Wt 207.0 lb

## 2024-01-10 DIAGNOSIS — J441 Chronic obstructive pulmonary disease with (acute) exacerbation: Secondary | ICD-10-CM | POA: Diagnosis not present

## 2024-01-10 MED ORDER — PREDNISONE 50 MG PO TABS: 50.0000 mg | ORAL_TABLET | Freq: Every day | ORAL | 0 refills | Status: AC

## 2024-01-10 MED ORDER — AZITHROMYCIN 250 MG PO TABS: ORAL_TABLET | ORAL | 0 refills | Status: AC

## 2024-01-10 NOTE — Assessment & Plan Note (Signed)
 Treating with Azithromycin  and Prednisone . Has follow up with Pulmonology.

## 2024-01-10 NOTE — Telephone Encounter (Signed)
 FYI Only or Action Required?: Action required by provider: medication refill request, clinical question for provider, and update on patient condition.  Patient is followed in Pulmonology for COPD, last seen on 12/09/2023 by Neda Jennet LABOR, MD.  Called Nurse Triage reporting No chief complaint on file..  Symptoms began several months ago.  Interventions attempted: Maintenance inhaler and Nebulizer treatments.  Symptoms are: gradually worsening.  Triage Disposition: See PCP When Office is Open (Within 3 Days)  Patient/caregiver understands and will follow disposition?: Yes   Copied from CRM [id number]. Topic: Clinical - Red Word Triage >> Jan 10, 2024  9:25 AM Leila BROCKS wrote: Red Word that prompted transfer to Nurse Triage: Patient [phone] states for 3-4 days a respiratory infection and wants medication. Patient states irriation in lungs, coughing up phelgm darkish yellow, shortness of breath, wheezing, sore and pain chest from coughing. Patient denies a fever.   Also, patient was given Breztri  samples and the office has been trying to get coverage from insurance. Also, patient is still using Trelegy samples and noticed more difficulty in breathing. Patient sent a message on Mychart and has not heard a response. Patient see Dr. Neda, please advise.   [address] - [place], [place] - [address] [place] [address] Phone: [phone] Fax: [phone] Reason for Disposition  [1] MODERATE longstanding difficulty breathing (e.g., speaks in phrases, SOB even at rest, pulse 100-120) AND [2] SAME as normal  Answer Assessment - Initial Assessment Questions 1. RESPIRATORY STATUS: Describe your breathing? (e.g., wheezing, shortness of breath, unable to speak, severe coughing)      Dyspnea with exertion, worse than normal  2. ONSET: When did this breathing problem begin?      Two weeks or 14 Days ago  3. PATTERN Does the difficult breathing  come and go, or has it been constant since it started?      Intermittent  4. SEVERITY: How bad is your breathing? (e.g., mild, moderate, severe)      Mild to Moderate  5. RECURRENT SYMPTOM: Have you had difficulty breathing before? If Yes, ask: When was the last time? and What happened that time?      Yes, Hx of COPD  6. CARDIAC HISTORY: Do you have any history of heart disease? (e.g., heart attack, angina, bypass surgery, angioplasty)      CAD, Hypertension, AAA  7. LUNG HISTORY: Do you have any history of lung disease?  (e.g., pulmonary embolus, asthma, emphysema)     COPD  8. CAUSE: What do you think is causing the breathing problem?      Respiratory Infection  9. OTHER SYMPTOMS: Do you have any other symptoms? (e.g., chest pain, cough, dizziness, fever, runny nose)     Cough-Productive (Dark Yellow), Eye Tenderness/Sensitivity, Frequency  10. O2 SATURATION MONITOR:  Do you use an oxygen saturation monitor (pulse oximeter) at home? If Yes, ask: What is your reading (oxygen level) today? What is your usual oxygen saturation reading? (e.g., 95%)       Average 97-99%, no changes from notations.   12. TRAVEL: Have you traveled out of the country in the last month? (e.g., travel history, exposures)       No  The patient is also requesting that the paperwork for the approval of his Bretztri  Protocols used: Breathing Difficulty-A-AH

## 2024-01-10 NOTE — Telephone Encounter (Signed)
ATC x1 LVM for patient to call our office back regarding prior message.  

## 2024-01-10 NOTE — Progress Notes (Signed)
 Subjective:  Patient ID: [name] [name], male    DOB: [birth year]  Age: 83 y.o. MRN: [id number]  CC:   Chief Complaint  Patient presents with   URI    Copd .Since Saturday hard to breathe, cough is productive, irritated nasal passages, hoarseness  Lung doctor has him on trillegy ellipta     HPI:  83 year old male with COPD presents for evaluation of the above.  Symptoms since Saturday. Reports productive cough, SOB, congestion. No fever. No relieving factors. Currently on Trelegy.  Social Hx   Social History   Socioeconomic History   Marital status: Widowed    Spouse name: Not on file   Number of children: Not on file   Years of education: Not on file   Highest education level: Not on file  Occupational History   Not on file  Tobacco Use   Smoking status: Former    Current packs/day: 0.00    Average packs/day: 1 pack/day for 35.0 years (35.0 ttl pk-yrs)    Types: Cigarettes    Start date: 03/27/1968    Quit date: 03/28/2003    Years since quitting: 20.8   Smokeless tobacco: Never  Vaping Use   Vaping status: Never Used  Substance and Sexual Activity   Alcohol use: No    Alcohol/week: 0.0 standard drinks of alcohol   Drug use: No   Sexual activity: Not Currently    Birth control/protection: None  Other Topics Concern   Not on file  Social History Narrative   Not on file   Social Drivers of Health   Financial Resource Strain: Low Risk  (03/18/2022)   Overall Financial Resource Strain (CARDIA)    Difficulty of Paying Living Expenses: Not very hard  Food Insecurity: No Food Insecurity (08/01/2023)   Hunger Vital Sign    Worried About Running Out of Food in the Last Year: Never true    Ran Out of Food in the Last Year: Never true  Transportation Needs: No Transportation Needs (08/01/2023)   PRAPARE - Administrator, Civil Service (Medical): No    Lack of Transportation (Non-Medical): No  Physical Activity: Insufficiently Active (03/18/2022)   Exercise  Vital Sign    Days of Exercise per Week: 2 days    Minutes of Exercise per Session: 20 min  Stress: No Stress Concern Present (03/18/2022)   [employer] of Occupational Health - Occupational Stress Questionnaire    Feeling of Stress : Not at all  Social Connections: Socially Isolated (07/30/2023)   Social Connection and Isolation Panel    Frequency of Communication with Friends and Family: More than three times a week    Frequency of Social Gatherings with Friends and Family: Three times a week    Attends Religious Services: Never    Active Member of Clubs or Organizations: No    Attends Banker Meetings: Never    Marital Status: Widowed    Review of Systems Per HPI  Objective:  BP 126/79   Pulse 85   Temp 98.1 F (36.7 C)   Ht 6' 1 (1.854 m)   Wt 207 lb (93.9 kg)   SpO2 95%   BMI 27.31 kg/m      01/10/2024    4:06 PM 12/14/2023   10:41 AM 12/09/2023   11:15 AM  BP/Weight  Systolic BP 126 151 157  Diastolic BP 79 89 96  Wt. (Lbs) 207 212 211  BMI 27.31 kg/m2 27.97 kg/m2 27.84 kg/m2  Physical Exam Constitutional:      General: He is not in acute distress. HENT:     Head: Normocephalic and atraumatic.  Cardiovascular:     Rate and Rhythm: Normal rate and regular rhythm.  Pulmonary:     Effort: Pulmonary effort is normal.     Breath sounds: Rales present.  Neurological:     Mental Status: He is alert.  Psychiatric:        Mood and Affect: Mood normal.        Behavior: Behavior normal.     Lab Results  Component Value Date   WBC 7.8 10/20/2023   HGB 14.4 10/20/2023   HCT 44.9 10/20/2023   PLT 178 10/20/2023   GLUCOSE 95 08/30/2023   CHOL 134 08/30/2023   TRIG 115 08/30/2023   HDL 45 08/30/2023   LDLCALC 68 08/30/2023   ALT 12 08/30/2023   AST 16 08/30/2023   NA 141 08/30/2023   K 4.5 08/30/2023   CL 101 08/30/2023   CREATININE 0.79 08/30/2023   BUN 11 08/30/2023   CO2 25 08/30/2023   TSH 0.961 08/30/2023   PSA 1.80  07/26/2014   INR 1.1 07/29/2023   HGBA1C 6.0 (H) 08/30/2023     Assessment & Plan:  COPD exacerbation (HCC) Assessment & Plan: Treating with Azithromycin  and Prednisone . Has follow up with Pulmonology.  Orders: -     Azithromycin ; Take 2 tablets on day 1, then 1 tablet daily on days 2 through 5  Dispense: 6 tablet; Refill: 0 -     predniSONE ; Take 1 tablet (50 mg total) by mouth daily for 5 days.  Dispense: 5 tablet; Refill: 0    Follow-up:  Return if symptoms worsen or fail to improve.  Jacqulyn Ahle DO [facility]

## 2024-01-10 NOTE — Telephone Encounter (Signed)
 Spoke with patient,patient has visit with primary care this afternoon,will keep appointment to evaluate issue of breztri  and trelegy and the required paperwork for insurance,feels like trelegy is not working,will change acute visit to in person to evaluate symptoms and breztri ,verbalized understanding,will change appointment.NFN

## 2024-01-11 ENCOUNTER — Telehealth: Payer: Self-pay

## 2024-01-11 ENCOUNTER — Other Ambulatory Visit (HOSPITAL_COMMUNITY): Payer: Self-pay

## 2024-01-11 ENCOUNTER — Ambulatory Visit (INDEPENDENT_AMBULATORY_CARE_PROVIDER_SITE_OTHER): Admitting: Pulmonary Disease

## 2024-01-11 VITALS — BP 126/71 | HR 80 | Ht 73.0 in | Wt 208.0 lb

## 2024-01-11 DIAGNOSIS — J441 Chronic obstructive pulmonary disease with (acute) exacerbation: Secondary | ICD-10-CM

## 2024-01-11 NOTE — Progress Notes (Signed)
 [name] [name]    [id number]    05/06/1941  Primary Care Physician:Cook, Jayce G, DO  Referring Physician: Cook, Jayce G, DO [address] [place],  [place] [address]  Chief complaint:   Patient being seen for COPD  HPI:  History of obstructive lung disease, currently on Advair  HFA Did try Breztri  at some point where she had  Has not been tolerating Trelegy well Recent exacerbation requiring course of antibiotics and steroids Followed up with his primary care doctor on 01/10/2024  He felt a lot better when he was on Breztri  Has tried other inhalers as well  Shortness of breath on exertion  If states he definitely feels more short of breath while being on the Trelegy, feels his glaucoma is acting up as well, just does not feel right  Quit smoking in 2004, 35-pack-year smoking history  Does have occasional cough, cough is mostly dry Albuterol  use as needed both an MDI and nebulizer  He does try to stay active  Triggers for his shortness of breath include weather changes, exposure to dust/fumes  Pulmonary rehab in 2022   Outpatient Encounter Medications as of 01/11/2024  Medication Sig   albuterol  (PROVENTIL ) (2.5 MG/3ML) 0.083% nebulizer solution Take 3 mLs (2.5 mg total) by nebulization every 6 (six) hours as needed for wheezing or shortness of breath.   albuterol  (VENTOLIN  HFA) 108 (90 Base) MCG/ACT inhaler Inhale 2 puffs into the lungs every 4 (four) hours as needed for wheezing or shortness of breath.   aspirin  EC 81 MG tablet Take 1 tablet (81 mg total) by mouth daily at 6 (six) AM. Swallow whole.   azithromycin  (ZITHROMAX ) 250 MG tablet Take 2 tablets on day 1, then 1 tablet daily on days 2 through 5   budesonide -glycopyrrolate -formoterol  (BREZTRI  AEROSPHERE) 160-9-4.8 MCG/ACT AERO inhaler Inhale 2 puffs into the lungs in the morning and at bedtime.   cetirizine (ZYRTEC) 10 MG tablet Take 10 mg by mouth daily.   COMBIGAN  0.2-0.5 % ophthalmic solution  Apply 1 drop to eye 2 (two) times daily.   esomeprazole  (NEXIUM ) 40 MG capsule Take 30-60 min before first meal of the day   famotidine  (PEPCID ) 20 MG tablet One after supper   fluticasone -salmeterol (ADVAIR  HFA) 230-21 MCG/ACT inhaler Take 2 puffs first thing in am and then another 2 puffs about 12 hours later.   LORazepam  (ATIVAN ) 0.5 MG tablet Take 1 tablet (0.5 mg total) by mouth at bedtime as needed.   losartan  (COZAAR ) 25 MG tablet Take 1 tablet (25 mg total) by mouth daily.   lovastatin  (MEVACOR ) 40 MG tablet Take 1 tablet (40 mg total) by mouth daily.   metroNIDAZOLE  (METROGEL ) 0.75 % gel Apply 1 Application topically daily as needed (rosacea).   oxymetazoline  (AFRIN) 0.05 % nasal spray Place 1 spray into both nostrils 2 (two) times daily as needed for congestion.   predniSONE  (DELTASONE ) 50 MG tablet Take 1 tablet (50 mg total) by mouth daily for 5 days.   Spacer/Aero-Holding Raguel FRENCH Use as directed   tamsulosin  (FLOMAX ) 0.4 MG CAPS capsule Take 1 capsule (0.4 mg total) by mouth daily.   triamcinolone  cream (KENALOG ) 0.1 % Apply 1 Application topically 2 (two) times daily as needed (itching).   No facility-administered encounter medications on file as of 01/11/2024.    Allergies as of 01/11/2024 - Review Complete 01/11/2024  Allergen Reaction Noted   Bactrim  [sulfamethoxazole -trimethoprim ] Hives 07/13/2022   Beta adrenergic blockers Diarrhea and  Other (See Comments) 08/01/2012   Lasix  [furosemide ] Rash 02/20/2015   Doxycycline  Swelling 04/28/2023   Myrbetriq  [mirabegron ] Hives and Itching 05/27/2023   Ciprofloxacin  Nausea And Vomiting, Rash, and Other (See Comments) 11/04/2010   Dexamethasone  Swelling 03/27/2013   Gabapentin  Swelling 12/26/2014   Methocarbamol Swelling and Rash 03/27/2013   Neomycin Swelling and Rash 03/09/2012   Penicillins Swelling and Rash 11/04/2010   Tetracyclines & related Itching 11/04/2010    Past Medical History:  Diagnosis Date   AAA  (abdominal aortic aneurysm) (HCC)    needs yearly ultrasound   Allergy    Anemia    Arthritis    Asthma    BCC (basal cell carcinoma) 08/18/1989   left shoulder blad, upper right arm, left arm beyond elbow, c&d   BCC (basal cell carcinoma) 01/31/1992   Posterior neck, curetx3, 74fu   BCC (basal cell carcinoma) 11/22/2001   mid forehead, cx3, excision, right forearm, cx3, 64fu   BCC (basal cell carcinoma) 10/09/2003   mid forehead, MOHs   BCC (basal cell carcinoma) 08/15/2008   upper left back, biopsy   BPH (benign prostatic hyperplasia)    CAD (coronary artery disease)    Cancer (HCC)    skin cancer   COPD (chronic obstructive pulmonary disease) (HCC)    Dysrhythmia    pt. states it can be fast at times   GERD (gastroesophageal reflux disease)    Glaucoma    History of acute pancreatitis 12/21/2022   HOH (hard of hearing)    Hypercholesterolemia    Hypertension    Impaired fasting glucose    Low back pain    Melanoma in situ (HCC) 10/09/2003   left chin, MOHs   MI (myocardial infarction) (HCC) 1999   Peripheral vascular disease (HCC)    AAA   SCC (squamous cell carcinoma) 07/03/2014   in situ, behind left ear, cx3, cautery, 103fu   SCC (squamous cell carcinoma) 07/03/2014   well diff, left forearm, biopsy, cx1, cautery   SCC (squamous cell carcinoma) 07/20/2017   in situ, left upper arm, cx3, 30fu   SCC (squamous cell carcinoma) 01/10/2019   in situ, left post shoulder, cx3, 75fu   SCC (squamous cell carcinoma) 11/22/2001   left forearm distal, left forearm, cx3, 80fu   SCC (squamous cell carcinoma) 10/09/2003   Bowens, left ear post, clear per st, right cheek clear   SCC (squamous cell carcinoma) 03/30/2004   in situ, left upper arm, cx3, 65fu   SCC (squamous cell carcinoma) 03/08/2005   in situ, right cheek, mid upper forehead, cx3, 40fu   SCC (squamous cell carcinoma) 06/08/2006   in situ, left shoulder, cx3, 34fu   SCC (squamous cell carcinoma) 05/05/2010   right  inner wrist, biopsy   SCC (squamous cell carcinoma) 09/13/2013   in situ, right crown scalp, front scalp, biopsy   Thrush     Past Surgical History:  Procedure Laterality Date   ABDOMINAL AORTIC ENDOVASCULAR STENT GRAFT N/A 07/29/2023   Procedure: INSERTION, ENDOVASCULAR STENT GRAFT, AORTA, ABDOMINAL;  Surgeon: Gretta Lonni PARAS, MD;  Location: [facility];  Service: Vascular;  Laterality: N/A;   BACK SURGERY     x 3   CARDIAC CATHETERIZATION     angioplasty   CATARACT EXTRACTION W/PHACO  03/20/2012   Procedure: CATARACT EXTRACTION PHACO AND INTRAOCULAR LENS PLACEMENT (IOC);  Surgeon: Dow JULIANNA Burke, MD;  Location: [facility];  Service: Ophthalmology;  Laterality: Right;  CDE:  8.45   CATARACT EXTRACTION W/PHACO Left 04/02/2013  Procedure: CATARACT EXTRACTION PHACO AND INTRAOCULAR LENS PLACEMENT (IOC);  Surgeon: Dow JULIANNA Burke, MD;  Location: [facility];  Service: Ophthalmology;  Laterality: Left;  CDE:  6.50   CHOLECYSTECTOMY  2000   COLONOSCOPY  2009   repeat 5 years   ESOPHAGOGASTRODUODENOSCOPY     HERNIA REPAIR Left    inguinal   INGUINAL HERNIA REPAIR Right 03/28/2020   Procedure: Right Inguinal Herniorrhaphy with Mesh;  Surgeon: Mavis Anes, MD;  Location: [facility];  Service: General;  Laterality: Right;   LAPAROSCOPIC PARTIAL COLECTOMY N/A 06/11/2013   Procedure: LAPAROSCOPIC HAND ASSISTED PARTIAL COLECTOMY;  Surgeon: Anes DELENA Mavis, MD;  Location: [facility];  Service: General;  Laterality: N/A;   NASAL ENDOSCOPY WITH EPISTAXIS CONTROL Bilateral 02/11/2020   Procedure: NASAL ENDOSCOPY WITH EPISTAXIS CONTROL;  Surgeon: Karis Clunes, MD;  Location: [facility];  Service: ENT;  Laterality: Bilateral;   REPAIR ILIAC ARTERY Right 07/29/2023   Procedure: REPAIR, ARTERY, FEMORAL;  Surgeon: Gretta Lonni PARAS, MD;  Location: [facility];  Service: Vascular;  Laterality: Right;   right eye detached retina Bilateral    SPINAL FUSION  2016   ULTRASOUND GUIDANCE FOR VASCULAR ACCESS Bilateral  07/29/2023   Procedure: ULTRASOUND GUIDANCE, FOR VASCULAR ACCESS;  Surgeon: Gretta Lonni PARAS, MD;  Location: [facility] OR;  Service: Vascular;  Laterality: Bilateral;   YAG LASER APPLICATION Left 05/06/2014   Procedure: YAG LASER APPLICATION;  Surgeon: Dow JULIANNA Burke, MD;  Location: [facility];  Service: Ophthalmology;  Laterality: Left;    Family History  Problem Relation Age of Onset   Hypertension Mother    COPD Father    Cancer Brother        brain    Social History   Socioeconomic History   Marital status: Widowed    Spouse name: Not on file   Number of children: Not on file   Years of education: Not on file   Highest education level: Not on file  Occupational History   Not on file  Tobacco Use   Smoking status: Former    Current packs/day: 0.00    Average packs/day: 1 pack/day for 35.0 years (35.0 ttl pk-yrs)    Types: Cigarettes    Start date: 03/27/1968    Quit date: 03/28/2003    Years since quitting: 20.8   Smokeless tobacco: Never  Vaping Use   Vaping status: Never Used  Substance and Sexual Activity   Alcohol use: No    Alcohol/week: 0.0 standard drinks of alcohol   Drug use: No   Sexual activity: Not Currently    Birth control/protection: None  Other Topics Concern   Not on file  Social History Narrative   Not on file   Social Drivers of Health   Financial Resource Strain: Low Risk  (03/18/2022)   Overall Financial Resource Strain (CARDIA)    Difficulty of Paying Living Expenses: Not very hard  Food Insecurity: No Food Insecurity (08/01/2023)   Hunger Vital Sign    Worried About Running Out of Food in the Last Year: Never true    Ran Out of Food in the Last Year: Never true  Transportation Needs: No Transportation Needs (08/01/2023)   PRAPARE - Administrator, Civil Service (Medical): No    Lack of Transportation (Non-Medical): No  Physical Activity: Insufficiently Active (03/18/2022)   Exercise Vital Sign    Days of Exercise per Week: 2 days     Minutes of Exercise per Session: 20 min  Stress:  No Stress Concern Present (03/18/2022)   [employer] of Occupational Health - Occupational Stress Questionnaire    Feeling of Stress : Not at all  Social Connections: Socially Isolated (07/30/2023)   Social Connection and Isolation Panel    Frequency of Communication with Friends and Family: More than three times a week    Frequency of Social Gatherings with Friends and Family: Three times a week    Attends Religious Services: Never    Active Member of Clubs or Organizations: No    Attends Banker Meetings: Never    Marital Status: Widowed  Intimate Partner Violence: Not At Risk (08/01/2023)   Humiliation, Afraid, Rape, and Kick questionnaire    Fear of Current or Ex-Partner: No    Emotionally Abused: No    Physically Abused: No    Sexually Abused: No    Review of Systems  Respiratory:  Positive for cough and shortness of breath.     Vitals:   01/11/24 1108  BP: 126/71  Pulse: 80  SpO2: 96%     Physical Exam Constitutional:      Appearance: He is obese.  HENT:     Head: Normocephalic.     Mouth/Throat:     Mouth: Mucous membranes are moist.  Eyes:     General: No scleral icterus.    Pupils: Pupils are equal, round, and reactive to light.  Cardiovascular:     Rate and Rhythm: Normal rate and regular rhythm.     Heart sounds: No murmur heard.    No friction rub.  Pulmonary:     Effort: No respiratory distress.     Breath sounds: No stridor. Rhonchi present. No wheezing or rales.     Comments: Decreased air movement at the bases, no rales, no rhonchi Musculoskeletal:     Cervical back: No rigidity or tenderness.  Lymphadenopathy:     Cervical: No cervical adenopathy.  Neurological:     Mental Status: He is alert.  Psychiatric:        Mood and Affect: Mood normal.    Data Reviewed: PFT 11/05/2019-severe obstructive disease with FEV1 at 43%  Last CT chest is 11/19/2016 showing evidence of  emphysema  Records from his PCPs office reviewed  Assessment:  Severe chronic obstructive pulmonary disease Gold 3 COPD - He did try Breztri  which worked well for him but not covered by his insurance - Has been on Trelegy which he is not tolerating well with worsening symptoms  Dyspnea on exertion  Encourage graded activity as tolerated  Regular exercise as tolerated  Hypertension  Cervical radiculopathy  I think patient will be best served if we can get Breztri  approved, was tolerating medication better, felt it was functioning a lot better   Plan/Recommendations: Will try and get Breztri  broth prior authorized  Continue steroids, continue antibiotics  Graded activity as tolerated  Unfortunately if Breztri  still not approved, may have to consider going back to Advair   Follow-up in about 3 months  Encouraged to call with significant concerns   Jennet Epley MD [facility] 01/11/2024, 11:12 AM  CC: Cook, Jayce G, DO

## 2024-01-11 NOTE — Telephone Encounter (Signed)
 Concerns addressed at visit today 8/27

## 2024-01-11 NOTE — Patient Instructions (Signed)
 We will try and get Breztri  approved based on intolerance to Trelegy  Continue and complete your course of antibiotics and steroids  Follow-up in about 3 months  Hopefully we can get the Breztri  approved for you  If not, we may have to go back to using Advair 

## 2024-01-13 ENCOUNTER — Other Ambulatory Visit (HOSPITAL_COMMUNITY): Payer: Self-pay

## 2024-01-13 ENCOUNTER — Telehealth: Payer: Self-pay

## 2024-01-13 NOTE — Telephone Encounter (Signed)
 Pharmacy Patient Advocate Encounter   Received notification from Pt Calls Messages that prior authorization for Breztri  Aerosphere 160-9-4.8MCG/ACT aerosol is required/requested.   Insurance verification completed.   The patient is insured through [employer] .   Prior Authorization for Breztri  Aerosphere 160-9-4.8MCG/ACT aerosol has been APPROVED from 01-13-2024 to 01-12-2025   PA #/Case ID/Reference #: [id number]

## 2024-01-18 MED ORDER — BREZTRI AEROSPHERE 160-9-4.8 MCG/ACT IN AERO: 2.0000 | INHALATION_SPRAY | Freq: Two times a day (BID) | RESPIRATORY_TRACT | 3 refills | Status: DC

## 2024-01-18 NOTE — Telephone Encounter (Signed)
 Copied from CRM [id number]. Topic: Clinical - Medication Prior Auth >> Jan 17, 2024  1:47 PM [name] [name] wrote: Reason for CRM: Patient is calling in regard to prior authorization for Breztri  - states it has been approved and would like it sent to    [facility] [place], [place] - [address] [place], [place] [address] Phone: [phone]  Fax: [phone]  Rx sent to pharmacy & pt is aware.

## 2024-01-19 ENCOUNTER — Ambulatory Visit: Payer: Self-pay | Admitting: *Deleted

## 2024-01-19 NOTE — Telephone Encounter (Signed)
 Copied from CRM [id number]. Topic: Clinical - Red Word Triage >> Jan 19, 2024  8:01 AM Charlet HERO wrote: Red Word that prompted transfer to Nurse Triage: Patient is calling to get appt thinks he has uti states he has frequent urination, pain in lower right side of back and burning when urinating. Dr Bluford. Reason for Disposition  All other males with painful urination  Answer Assessment - Initial Assessment Questions 1. SEVERITY: How bad is the pain?  (e.g., Scale 1-10; mild, moderate, or severe)     I'm having urinary frequency and pain in right side of back.   3 days ago started with frequency especially during the night.    2. FREQUENCY: How many times have you had painful urination today?      Burning urination 3. PATTERN: Is pain present every time you urinate or just sometimes?      Yes 4. ONSET: When did the painful urination start?      3 days ago  5. FEVER: Do you have a fever? If Yes, ask: What is your temperature, how was it measured, and when did it start?     I can't tell.    6. PAST UTI: Have you had a urine infection before? If Yes, ask: When was the last time? and What happened that time?      I had one last winter 7. CAUSE: What do you think is causing the painful urination?      UTI 8. OTHER SYMPTOMS: Do you have any other symptoms? (e.g., flank pain, penis discharge, scrotal pain, blood in urine)     Right flank pain  Protocols used: Urination Pain - Male-A-AH FYI Only or Action Required?: FYI only for provider.  Patient was last seen in primary care on 01/10/2024 by Cook, Jayce G, DO.  Called Nurse Triage reporting Urinary Frequency.  Symptoms began several days ago.3 days ago frequency, burning, right flank pain  Interventions attempted: Nothing.  Symptoms are: gradually worsening.  Triage Disposition: See Physician Within 24 Hours  Patient/caregiver understands and will follow disposition?: Yes

## 2024-01-19 NOTE — Telephone Encounter (Signed)
 FYI

## 2024-01-20 ENCOUNTER — Telehealth: Payer: Self-pay

## 2024-01-20 ENCOUNTER — Ambulatory Visit (INDEPENDENT_AMBULATORY_CARE_PROVIDER_SITE_OTHER): Admitting: Family Medicine

## 2024-01-20 VITALS — BP 115/70 | Ht 73.0 in | Wt 208.4 lb

## 2024-01-20 DIAGNOSIS — R399 Unspecified symptoms and signs involving the genitourinary system: Secondary | ICD-10-CM | POA: Diagnosis not present

## 2024-01-20 DIAGNOSIS — R300 Dysuria: Secondary | ICD-10-CM

## 2024-01-20 LAB — POCT URINALYSIS DIPSTICK
Spec Grav, UA: 1.01 (ref 1.010–1.025)
pH, UA: 6.5 (ref 5.0–8.0)

## 2024-01-20 MED ORDER — TAMSULOSIN HCL 0.4 MG PO CAPS: 0.8000 mg | ORAL_CAPSULE | Freq: Every day | ORAL | Status: AC

## 2024-01-20 NOTE — Patient Instructions (Signed)
 Medication as prescribed.  Waiting to hear back from Urology.

## 2024-01-20 NOTE — Telephone Encounter (Signed)
 Copied from CRM [id number]. Topic: Clinical - Prescription Issue >> Jan 19, 2024 10:59 AM [name] [name] wrote: Reason for CRM: Patient called regarding medication. Patient stated he was prescribed budesonide -glycopyrrolate -formoterol  (BREZTRI  AEROSPHERE) 160-9-4.8 MCG/ACT AERO inhaler and [employer] approved but it is 966.00 and his copay is $300 and he decided not to get the medication due to the cost, patient stay he will stay with the fluticasone -salmeterol (ADVAIR  HFA) 230-21 MCG/ACT inhaler.  FYI Dr. Neda

## 2024-01-22 DIAGNOSIS — R399 Unspecified symptoms and signs involving the genitourinary system: Secondary | ICD-10-CM | POA: Insufficient documentation

## 2024-01-22 NOTE — Progress Notes (Signed)
 Subjective:  Patient ID: [name] [name], male    DOB: 1940/08/19  Age: 83 y.o. MRN: [id number]  CC:   Chief Complaint  Patient presents with   urinary symptoms    HPI:  83 year old presents with urinary symptoms.  Patient follows with urology.  He is on Flomax .  History of prostatitis.  He reports 4 to 5 days of frequency.  Reports back pain.  Also reports chills.  Some dysuria.  Patient concerned that he has urinary tract infection.  No relieving factors.  Patient Active Problem List   Diagnosis Date Noted   Lower urinary tract symptoms (LUTS) 01/22/2024   COPD exacerbation (HCC) 01/10/2024   Lower extremity edema 12/15/2023   Tremulousness 12/15/2023   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   DOE (dyspnea on exertion) 10/20/2023   Cervical radiculopathy 08/15/2023   BPH associated with nocturia 05/24/2023   COPD GOLD 3/ AB 05/05/2023   Celiac artery stenosis (HCC) 02/02/2023   Chronic idiopathic constipation 12/21/2022   Bilateral primary osteoarthritis of knee 04/14/2022   Irritable bowel syndrome 03/17/2022   Recurrent right lower quadrant abdominal pain 03/03/2022   Seasonal allergies 08/06/2021   CAD (coronary artery disease) 04/23/2021   Hypertension 09/16/2016   Lumbar spondylosis 11/05/2014   GERD (gastroesophageal reflux disease) 01/19/2012   Hyperlipidemia 04/15/2008   AAA (abdominal aortic aneurysm) (HCC) 04/15/2008    Social Hx   Social History   Socioeconomic History   Marital status: Widowed    Spouse name: Not on file   Number of children: Not on file   Years of education: Not on file   Highest education level: Not on file  Occupational History   Not on file  Tobacco Use   Smoking status: Former    Current packs/day: 0.00    Average packs/day: 1 pack/day for 35.0 years (35.0 ttl pk-yrs)    Types: Cigarettes    Start date: 03/27/1968    Quit date: 03/28/2003    Years since quitting: 20.8   Smokeless tobacco: Never  Vaping Use    Vaping status: Never Used  Substance and Sexual Activity   Alcohol use: No    Alcohol/week: 0.0 standard drinks of alcohol   Drug use: No   Sexual activity: Not Currently    Birth control/protection: None  Other Topics Concern   Not on file  Social History Narrative   Not on file   Social Drivers of Health   Financial Resource Strain: Low Risk  (03/18/2022)   Overall Financial Resource Strain (CARDIA)    Difficulty of Paying Living Expenses: Not very hard  Food Insecurity: No Food Insecurity (08/01/2023)   Hunger Vital Sign    Worried About Running Out of Food in the Last Year: Never true    Ran Out of Food in the Last Year: Never true  Transportation Needs: No Transportation Needs (08/01/2023)   PRAPARE - Administrator, Civil Service (Medical): No    Lack of Transportation (Non-Medical): No  Physical Activity: Insufficiently Active (03/18/2022)   Exercise Vital Sign    Days of Exercise per Week: 2 days    Minutes of Exercise per Session: 20 min  Stress: No Stress Concern Present (03/18/2022)   [employer] of Occupational Health - Occupational Stress Questionnaire    Feeling of Stress : Not at all  Social Connections: Socially Isolated (07/30/2023)   Social Connection and Isolation Panel    Frequency of Communication with Friends and Family: More than three times a  week    Frequency of Social Gatherings with Friends and Family: Three times a week    Attends Religious Services: Never    Active Member of Clubs or Organizations: No    Attends Banker Meetings: Never    Marital Status: Widowed    Review of Systems Per HPI  Objective:  BP 115/70   Ht 6' 1 (1.854 m)   Wt 208 lb 6.4 oz (94.5 kg)   BMI 27.50 kg/m      01/20/2024   10:52 AM 01/11/2024   11:08 AM 01/10/2024    4:06 PM  BP/Weight  Systolic BP 115 126 126  Diastolic BP 70 71 79  Wt. (Lbs) 208.4 208 207  BMI 27.5 kg/m2 27.44 kg/m2 27.31 kg/m2    Physical Exam Constitutional:       General: He is not in acute distress.    Appearance: Normal appearance.  HENT:     Head: Normocephalic and atraumatic.  Cardiovascular:     Rate and Rhythm: Normal rate and regular rhythm.  Pulmonary:     Effort: Pulmonary effort is normal.     Breath sounds: Normal breath sounds.  Abdominal:     General: There is no distension.     Palpations: Abdomen is soft.     Tenderness: There is no abdominal tenderness.  Neurological:     Mental Status: He is alert.     Lab Results  Component Value Date   WBC 7.8 10/20/2023   HGB 14.4 10/20/2023   HCT 44.9 10/20/2023   PLT 178 10/20/2023   GLUCOSE 95 08/30/2023   CHOL 134 08/30/2023   TRIG 115 08/30/2023   HDL 45 08/30/2023   LDLCALC 68 08/30/2023   ALT 12 08/30/2023   AST 16 08/30/2023   NA 141 08/30/2023   K 4.5 08/30/2023   CL 101 08/30/2023   CREATININE 0.79 08/30/2023   BUN 11 08/30/2023   CO2 25 08/30/2023   TSH 0.961 08/30/2023   PSA 1.80 07/26/2014   INR 1.1 07/29/2023   HGBA1C 6.0 (H) 08/30/2023     Assessment & Plan:  Lower urinary tract symptoms (LUTS) Assessment & Plan: Increasing Flomax . Will discuss possible antibiotic treatment with urology. Urine was negative today.  Orders: -     POCT urinalysis dipstick -     Tamsulosin  HCl; Take 2 capsules (0.8 mg total) by mouth daily.  Dispense: 180 capsule; Refill: 01    Follow-up:  Return if symptoms worsen or fail to improve.  Jacqulyn Ahle DO [facility]

## 2024-01-22 NOTE — Assessment & Plan Note (Signed)
 Increasing Flomax . Will discuss possible antibiotic treatment with urology. Urine was negative today.

## 2024-01-23 ENCOUNTER — Other Ambulatory Visit: Payer: Self-pay | Admitting: Pulmonary Disease

## 2024-01-24 ENCOUNTER — Telehealth: Payer: Self-pay | Admitting: Urology

## 2024-01-24 ENCOUNTER — Encounter: Payer: Self-pay | Admitting: Family Medicine

## 2024-01-24 NOTE — Telephone Encounter (Signed)
 Says Dr Bluford PCP sent [name] [name] a message that he needs to be see for ongoing problem with infection, frequent urination and leakage

## 2024-01-25 DIAGNOSIS — N390 Urinary tract infection, site not specified: Secondary | ICD-10-CM | POA: Diagnosis not present

## 2024-01-27 ENCOUNTER — Ambulatory Visit: Payer: Medicare Other | Admitting: Urology

## 2024-01-29 ENCOUNTER — Other Ambulatory Visit: Payer: Self-pay | Admitting: Family Medicine

## 2024-01-29 DIAGNOSIS — G4700 Insomnia, unspecified: Secondary | ICD-10-CM

## 2024-01-30 DIAGNOSIS — N400 Enlarged prostate without lower urinary tract symptoms: Secondary | ICD-10-CM | POA: Diagnosis not present

## 2024-01-30 DIAGNOSIS — E785 Hyperlipidemia, unspecified: Secondary | ICD-10-CM | POA: Diagnosis not present

## 2024-01-30 DIAGNOSIS — I714 Abdominal aortic aneurysm, without rupture, unspecified: Secondary | ICD-10-CM | POA: Diagnosis not present

## 2024-01-30 DIAGNOSIS — R809 Proteinuria, unspecified: Secondary | ICD-10-CM | POA: Diagnosis not present

## 2024-01-30 DIAGNOSIS — J449 Chronic obstructive pulmonary disease, unspecified: Secondary | ICD-10-CM | POA: Diagnosis not present

## 2024-01-30 DIAGNOSIS — I2510 Atherosclerotic heart disease of native coronary artery without angina pectoris: Secondary | ICD-10-CM | POA: Diagnosis not present

## 2024-01-30 DIAGNOSIS — I129 Hypertensive chronic kidney disease with stage 1 through stage 4 chronic kidney disease, or unspecified chronic kidney disease: Secondary | ICD-10-CM | POA: Diagnosis not present

## 2024-01-30 DIAGNOSIS — N182 Chronic kidney disease, stage 2 (mild): Secondary | ICD-10-CM | POA: Diagnosis not present

## 2024-01-30 NOTE — Telephone Encounter (Signed)
 FYI and advise

## 2024-01-30 NOTE — Telephone Encounter (Signed)
 Patient came to office and still hasn't heard from anyone about getting an appt

## 2024-01-31 ENCOUNTER — Other Ambulatory Visit (HOSPITAL_COMMUNITY): Payer: Self-pay | Admitting: Nephrology

## 2024-01-31 DIAGNOSIS — N182 Chronic kidney disease, stage 2 (mild): Secondary | ICD-10-CM

## 2024-01-31 DIAGNOSIS — N400 Enlarged prostate without lower urinary tract symptoms: Secondary | ICD-10-CM | POA: Diagnosis not present

## 2024-01-31 DIAGNOSIS — R809 Proteinuria, unspecified: Secondary | ICD-10-CM | POA: Diagnosis not present

## 2024-02-02 ENCOUNTER — Other Ambulatory Visit: Payer: Self-pay | Admitting: Family Medicine

## 2024-02-02 ENCOUNTER — Encounter (INDEPENDENT_AMBULATORY_CARE_PROVIDER_SITE_OTHER): Payer: Self-pay | Admitting: Gastroenterology

## 2024-02-02 ENCOUNTER — Ambulatory Visit (INDEPENDENT_AMBULATORY_CARE_PROVIDER_SITE_OTHER): Payer: Medicare Other | Admitting: Gastroenterology

## 2024-02-02 VITALS — BP 116/67 | HR 82 | Temp 97.2°F | Ht 73.0 in | Wt 209.0 lb

## 2024-02-02 DIAGNOSIS — G8929 Other chronic pain: Secondary | ICD-10-CM | POA: Insufficient documentation

## 2024-02-02 DIAGNOSIS — K5900 Constipation, unspecified: Secondary | ICD-10-CM | POA: Diagnosis not present

## 2024-02-02 DIAGNOSIS — K5904 Chronic idiopathic constipation: Secondary | ICD-10-CM

## 2024-02-02 MED ORDER — CEFUROXIME AXETIL 500 MG PO TABS: 500.0000 mg | ORAL_TABLET | Freq: Three times a day (TID) | ORAL | 0 refills | Status: AC

## 2024-02-02 NOTE — Patient Instructions (Signed)
 Increase water intake, aim for atleast 64 oz per day Increase fruits, veggies and whole grains, kiwi and prunes are especially good for constipation You can try restarting metamucil twice daily to help with more regular BMs  Follow up 1 year  It was a pleasure to see you today. I want to create trusting relationships with patients and provide genuine, compassionate, and quality care. I truly value your feedback! please be on the lookout for a survey regarding your visit with me today. I appreciate your input about our visit and your time in completing this!    Katty Fretwell L. Natash Berman, MSN, APRN, AGNP-C Adult-Gerontology Nurse Practitioner [facility] at [facility]

## 2024-02-02 NOTE — Progress Notes (Signed)
 Referring Provider: Cook, Jayce G, DO Primary Care Physician:  Cook, Jayce G, DO Primary GI Physician: DR. Ahmed   Chief Complaint  Patient presents with   Follow-up    Pt arrives for follow up. Pt states no questions/concerns at this time.    HPI:   [name] [name] is a 83 y.o. male with past medical history of AAA, coronary artery disease , hyperlipidemia ,GERD, hypertension   Patient presenting today for:  Follow up of abdominal pain and constipation   Last seen September 2024, at that time reported ongoing RLQ pain sometimes worsening with Constipation, having more regular BMs on miralax  and metamucil with improvement in RLQ Pain, though still with some episodes, worse with movement and sometimes improved with a BM   Recommended good water intake, high fiber diet, continue miralax  and metamucil, discussed updating colonoscopy which patient deferred due to advanced age and comorbidities  Present: Last labs in June with hgb 14.4, TSH in April 0.961  Had recent AAA stent placed  No abdominal pain at current. Not currently taking anything routinely for constipation. Having a BM daily to every other day. He denies rectal bleeding or melena. Takes MOM if feeling more constipated, using this maybe once a week or every other week.  Water intake is not great, per patient, maybe a few glasses per day. Mostly drinks tea and coffee. Some fruits and vegetables in his diet. Appetite may not be as good as it used to be. Weight is mostly stable. No nausea or vomiting.   CT Angio A/P w wo 08/2023 Postsurgical changes from stent graft placement within the infrarenal abdominal aorta. The aortic aneurysm is stable in size estimated at 6.0 x 4.7 cm. No evidence of endoleak. (No pancreatic abnormalities noted)  CT abdomen and Pelvis 12/28/2022 Infrarenal abdominal aortic aneurysm with the greatest diameter on the parasagittal images estimated 5.2 cm. Circumferential plaque/thrombus at the level of  the aneurysm sac. Aortic atherosclerosis with associated mild/moderate bilateral CIA disease. Aortic Atherosclerosis (ICD10-I70.0).  Last EGD: none  Last Colonoscopy:2016  Report not available   Filed Weights   02/02/24 1024  Weight: 209 lb (94.8 kg)     Past Medical History:  Diagnosis Date   AAA (abdominal aortic aneurysm) (HCC)    needs yearly ultrasound   Allergy    Anemia    Arthritis    Asthma    BCC (basal cell carcinoma) 08/18/1989   left shoulder blad, upper right arm, left arm beyond elbow, c&d   BCC (basal cell carcinoma) 01/31/1992   Posterior neck, curetx3, 19fu   BCC (basal cell carcinoma) 11/22/2001   mid forehead, cx3, excision, right forearm, cx3, 51fu   BCC (basal cell carcinoma) 10/09/2003   mid forehead, MOHs   BCC (basal cell carcinoma) 08/15/2008   upper left back, biopsy   BPH (benign prostatic hyperplasia)    CAD (coronary artery disease)    Cancer (HCC)    skin cancer   COPD (chronic obstructive pulmonary disease) (HCC)    Dysrhythmia    pt. states it can be fast at times   GERD (gastroesophageal reflux disease)    Glaucoma    History of acute pancreatitis 12/21/2022   HOH (hard of hearing)    Hypercholesterolemia    Hypertension    Impaired fasting glucose    Low back pain    Melanoma in situ (HCC) 10/09/2003   left chin, MOHs   MI (myocardial infarction) (HCC) 1999   Peripheral vascular disease (HCC)  AAA   SCC (squamous cell carcinoma) 07/03/2014   in situ, behind left ear, cx3, cautery, 21fu   SCC (squamous cell carcinoma) 07/03/2014   well diff, left forearm, biopsy, cx1, cautery   SCC (squamous cell carcinoma) 07/20/2017   in situ, left upper arm, cx3, 77fu   SCC (squamous cell carcinoma) 01/10/2019   in situ, left post shoulder, cx3, 80fu   SCC (squamous cell carcinoma) 11/22/2001   left forearm distal, left forearm, cx3, 62fu   SCC (squamous cell carcinoma) 10/09/2003   Bow[place], left ear post, clear per st, right cheek clear    SCC (squamous cell carcinoma) 03/30/2004   in situ, left upper arm, cx3, 33fu   SCC (squamous cell carcinoma) 03/08/2005   in situ, right cheek, mid upper forehead, cx3, 19fu   SCC (squamous cell carcinoma) 06/08/2006   in situ, left shoulder, cx3, 16fu   SCC (squamous cell carcinoma) 05/05/2010   right inner wrist, biopsy   SCC (squamous cell carcinoma) 09/13/2013   in situ, right crown scalp, front scalp, biopsy   Thrush     Past Surgical History:  Procedure Laterality Date   ABDOMINAL AORTIC ENDOVASCULAR STENT GRAFT N/A 07/29/2023   Procedure: INSERTION, ENDOVASCULAR STENT GRAFT, AORTA, ABDOMINAL;  Surgeon: Gretta Lonni PARAS, MD;  Location: [facility];  Service: Vascular;  Laterality: N/A;   BACK SURGERY     x 3   CARDIAC CATHETERIZATION     angioplasty   CATARACT EXTRACTION W/PHACO  03/20/2012   Procedure: CATARACT EXTRACTION PHACO AND INTRAOCULAR LENS PLACEMENT (IOC);  Surgeon: Dow JULIANNA Burke, MD;  Location: [facility];  Service: Ophthalmology;  Laterality: Right;  CDE:  8.45   CATARACT EXTRACTION W/PHACO Left 04/02/2013   Procedure: CATARACT EXTRACTION PHACO AND INTRAOCULAR LENS PLACEMENT (IOC);  Surgeon: Dow JULIANNA Burke, MD;  Location: [facility];  Service: Ophthalmology;  Laterality: Left;  CDE:  6.50   CHOLECYSTECTOMY  2000   COLONOSCOPY  2009   repeat 5 years   ESOPHAGOGASTRODUODENOSCOPY     HERNIA REPAIR Left    inguinal   INGUINAL HERNIA REPAIR Right 03/28/2020   Procedure: Right Inguinal Herniorrhaphy with Mesh;  Surgeon: Mavis Anes, MD;  Location: [facility];  Service: General;  Laterality: Right;   LAPAROSCOPIC PARTIAL COLECTOMY N/A 06/11/2013   Procedure: LAPAROSCOPIC HAND ASSISTED PARTIAL COLECTOMY;  Surgeon: Anes DELENA Mavis, MD;  Location: [facility];  Service: General;  Laterality: N/A;   NASAL ENDOSCOPY WITH EPISTAXIS CONTROL Bilateral 02/11/2020   Procedure: NASAL ENDOSCOPY WITH EPISTAXIS CONTROL;  Surgeon: Karis Clunes, MD;  Location:  SURGERY CENTER;  Service: ENT;   Laterality: Bilateral;   REPAIR ILIAC ARTERY Right 07/29/2023   Procedure: REPAIR, ARTERY, FEMORAL;  Surgeon: Gretta Lonni PARAS, MD;  Location: [facility];  Service: Vascular;  Laterality: Right;   right eye detached retina Bilateral    SPINAL FUSION  2016   ULTRASOUND GUIDANCE FOR VASCULAR ACCESS Bilateral 07/29/2023   Procedure: ULTRASOUND GUIDANCE, FOR VASCULAR ACCESS;  Surgeon: Gretta Lonni PARAS, MD;  Location: [facility];  Service: Vascular;  Laterality: Bilateral;   YAG LASER APPLICATION Left 05/06/2014   Procedure: YAG LASER APPLICATION;  Surgeon: Dow JULIANNA Burke, MD;  Location: [facility];  Service: Ophthalmology;  Laterality: Left;    Current Outpatient Medications  Medication Sig Dispense Refill   albuterol  (PROVENTIL ) (2.5 MG/3ML) 0.083% nebulizer solution Take 3 mLs (2.5 mg total) by nebulization every 6 (six) hours as needed for wheezing or shortness of breath. 360 mL 5   albuterol  (  VENTOLIN  HFA) 108 (90 Base) MCG/ACT inhaler Inhale 2 puffs into the lungs every 4 (four) hours as needed for wheezing or shortness of breath. 18 each 0   aspirin  EC 81 MG tablet Take 1 tablet (81 mg total) by mouth daily at 6 (six) AM. Swallow whole. 30 tablet 12   budesonide -glycopyrrolate -formoterol  (BREZTRI  AEROSPHERE) 160-9-4.8 MCG/ACT AERO inhaler Inhale 2 puffs into the lungs in the morning and at bedtime. 10.7 g 3   cetirizine (ZYRTEC) 10 MG tablet Take 10 mg by mouth daily.     COMBIGAN  0.2-0.5 % ophthalmic solution Apply 1 drop to eye 2 (two) times daily.     esomeprazole  (NEXIUM ) 40 MG capsule Take 30-60 min before first meal of the day 30 capsule 11   famotidine  (PEPCID ) 20 MG tablet One after supper 30 tablet 11   fluticasone -salmeterol (ADVAIR  HFA) 230-21 MCG/ACT inhaler Take 2 puffs first thing in am and then another 2 puffs about 12 hours later. 1 each 12   LORazepam  (ATIVAN ) 0.5 MG tablet Take 1 tablet (0.5 mg total) by mouth at bedtime as needed. 30 tablet 3   losartan  (COZAAR ) 25 MG tablet  Take 1 tablet (25 mg total) by mouth daily. 90 tablet 3   lovastatin  (MEVACOR ) 40 MG tablet Take 1 tablet (40 mg total) by mouth daily. 90 tablet 0   metroNIDAZOLE  (METROGEL ) 0.75 % gel Apply 1 Application topically daily as needed (rosacea).     oxymetazoline  (AFRIN) 0.05 % nasal spray Place 1 spray into both nostrils 2 (two) times daily as needed for congestion.     Spacer/Aero-Holding Raguel FRENCH Use as directed 1 each 0   tamsulosin  (FLOMAX ) 0.4 MG CAPS capsule Take 2 capsules (0.8 mg total) by mouth daily. 180 capsule 01   triamcinolone  cream (KENALOG ) 0.1 % Apply 1 Application topically 2 (two) times daily as needed (itching).     No current facility-administered medications for this visit.    Allergies as of 02/02/2024 - Review Complete 02/02/2024  Allergen Reaction Noted   Bactrim  [sulfamethoxazole -trimethoprim ] Hives 07/13/2022   Beta adrenergic blockers Diarrhea and Other (See Comments) 08/01/2012   Lasix  [furosemide ] Rash 02/20/2015   Doxycycline  Swelling 04/28/2023   Myrbetriq  [mirabegron ] Hives and Itching 05/27/2023   Ciprofloxacin  Nausea And Vomiting, Rash, and Other (See Comments) 11/04/2010   Dexamethasone  Swelling 03/27/2013   Gabapentin  Swelling 12/26/2014   Methocarbamol Swelling and Rash 03/27/2013   Neomycin Swelling and Rash 03/09/2012   Penicillins Swelling and Rash 11/04/2010   Tetracyclines & related Itching 11/04/2010    Social History   Socioeconomic History   Marital status: Widowed    Spouse name: Not on file   Number of children: Not on file   Years of education: Not on file   Highest education level: Not on file  Occupational History   Not on file  Tobacco Use   Smoking status: Former    Current packs/day: 0.00    Average packs/day: 1 pack/day for 35.0 years (35.0 ttl pk-yrs)    Types: Cigarettes    Start date: 03/27/1968    Quit date: 03/28/2003    Years since quitting: 20.8   Smokeless tobacco: Never  Vaping Use   Vaping status: Never  Used  Substance and Sexual Activity   Alcohol use: No    Alcohol/week: 0.0 standard drinks of alcohol   Drug use: No   Sexual activity: Not Currently    Birth control/protection: None  Other Topics Concern   Not on file  Social History  Narrative   Not on file   Social Drivers of Health   Financial Resource Strain: Low Risk  (03/18/2022)   Overall Financial Resource Strain (CARDIA)    Difficulty of Paying Living Expenses: Not very hard  Food Insecurity: No Food Insecurity (08/01/2023)   Hunger Vital Sign    Worried About Running Out of Food in the Last Year: Never true    Ran Out of Food in the Last Year: Never true  Transportation Needs: No Transportation Needs (08/01/2023)   PRAPARE - Administrator, Civil Service (Medical): No    Lack of Transportation (Non-Medical): No  Physical Activity: Insufficiently Active (03/18/2022)   Exercise Vital Sign    Days of Exercise per Week: 2 days    Minutes of Exercise per Session: 20 min  Stress: No Stress Concern Present (03/18/2022)   [employer] of Occupational Health - Occupational Stress Questionnaire    Feeling of Stress : Not at all  Social Connections: Socially Isolated (07/30/2023)   Social Connection and Isolation Panel    Frequency of Communication with Friends and Family: More than three times a week    Frequency of Social Gatherings with Friends and Family: Three times a week    Attends Religious Services: Never    Active Member of Clubs or Organizations: No    Attends Banker Meetings: Never    Marital Status: Widowed    Review of systems General: negative for malaise, night sweats, fever, chills, weight loss Neck: Negative for lumps, goiter, pain and significant neck swelling Resp: Negative for cough, wheezing, dyspnea at rest CV: Negative for chest pain, leg swelling, palpitations, orthopnea GI: denies melena, hematochezia, nausea, vomiting, diarrhea, dysphagia, odyonophagia, early satiety or  unintentional weight loss. +constipation  MSK: Negative for joint pain or swelling, back pain, and muscle pain. Derm: Negative for itching or rash Psych: Denies depression, anxiety, memory loss, confusion. No homicidal or suicidal ideation.  Heme: Negative for prolonged bleeding, bruising easily, and swollen nodes. Endocrine: Negative for cold or heat intolerance, polyuria, polydipsia and goiter. Neuro: negative for tremor, gait imbalance, syncope and seizures. The remainder of the review of systems is noncontributory.  Physical Exam: BP 116/67   Pulse 82   Temp (!) 97.2 F (36.2 C)   Ht 6' 1 (1.854 m)   Wt 209 lb (94.8 kg)   BMI 27.57 kg/m  General:   Alert and oriented. No distress noted. Pleasant and cooperative.  Head:  Normocephalic and atraumatic. Eyes:  Conjuctiva clear without scleral icterus. Mouth:  Oral mucosa pink and moist. Good dentition. No lesions. Heart: Normal rate and rhythm, s1 and s2 heart sounds present.  Lungs: Clear lung sounds in all lobes. Respirations equal and unlabored. Abdomen:  +BS, soft, non-tender and non-distended. No rebound or guarding. No HSM or masses noted. Derm: No palmar erythema or jaundice Msk:  Symmetrical without gross deformities. Normal posture. Extremities:  Without edema. Neurologic:  Alert and  oriented x4 Psych:  Alert and cooperative. Normal mood and affect.  Invalid input(s): 6 MONTHS   ASSESSMENT: [name] [name] is a 83 y.o. male presenting today for follow up of abdominal pain and constipation  No further abdominal pain. Still with some occasional constipation though mostly having a BM every day to every other day without rectal bleeding or melena. Using MOM when feeling more constipated. He did well on miralax  and metamucil in the past. Recommend increasing water intake, diet with plenty of fiber, fruits, veggies, whole grains  and may benefit from restarting metamucil BID.   PLAN:  -Increase water intake, aim for  atleast 64 oz per day -Increase fruits, veggies and whole grains, kiwi and prunes are especially good for constipation -start metamucil BID  All questions were answered, patient verbalized understanding and is in agreement with plan as outlined above.   Follow Up: 1 year   Oveda Dadamo L. Auther Lyerly, MSN, APRN, AGNP-C Adult-Gerontology Nurse Practitioner [facility]

## 2024-02-04 ENCOUNTER — Emergency Department (HOSPITAL_COMMUNITY)

## 2024-02-04 ENCOUNTER — Observation Stay (HOSPITAL_COMMUNITY)
Admission: EM | Admit: 2024-02-04 | Discharge: 2024-02-05 | Disposition: A | Discharge status: Home / Self Care | Attending: Internal Medicine | Admitting: Internal Medicine

## 2024-02-04 ENCOUNTER — Other Ambulatory Visit: Payer: Self-pay

## 2024-02-04 ENCOUNTER — Encounter (HOSPITAL_COMMUNITY): Payer: Self-pay

## 2024-02-04 DIAGNOSIS — K219 Gastro-esophageal reflux disease without esophagitis: Secondary | ICD-10-CM | POA: Diagnosis present

## 2024-02-04 DIAGNOSIS — E782 Mixed hyperlipidemia: Secondary | ICD-10-CM | POA: Diagnosis present

## 2024-02-04 DIAGNOSIS — R040 Epistaxis: Secondary | ICD-10-CM | POA: Diagnosis not present

## 2024-02-04 DIAGNOSIS — I4891 Unspecified atrial fibrillation: Principal | ICD-10-CM

## 2024-02-04 DIAGNOSIS — Z7982 Long term (current) use of aspirin: Secondary | ICD-10-CM | POA: Diagnosis not present

## 2024-02-04 DIAGNOSIS — N400 Enlarged prostate without lower urinary tract symptoms: Secondary | ICD-10-CM | POA: Diagnosis present

## 2024-02-04 DIAGNOSIS — J449 Chronic obstructive pulmonary disease, unspecified: Secondary | ICD-10-CM | POA: Diagnosis present

## 2024-02-04 DIAGNOSIS — J309 Allergic rhinitis, unspecified: Secondary | ICD-10-CM | POA: Diagnosis not present

## 2024-02-04 DIAGNOSIS — R0602 Shortness of breath: Secondary | ICD-10-CM | POA: Diagnosis not present

## 2024-02-04 DIAGNOSIS — I714 Abdominal aortic aneurysm, without rupture, unspecified: Secondary | ICD-10-CM | POA: Diagnosis present

## 2024-02-04 DIAGNOSIS — Z87891 Personal history of nicotine dependence: Secondary | ICD-10-CM | POA: Diagnosis not present

## 2024-02-04 DIAGNOSIS — I480 Paroxysmal atrial fibrillation: Secondary | ICD-10-CM | POA: Diagnosis not present

## 2024-02-04 DIAGNOSIS — R778 Other specified abnormalities of plasma proteins: Secondary | ICD-10-CM | POA: Diagnosis not present

## 2024-02-04 DIAGNOSIS — R7989 Other specified abnormal findings of blood chemistry: Secondary | ICD-10-CM | POA: Diagnosis not present

## 2024-02-04 DIAGNOSIS — I2510 Atherosclerotic heart disease of native coronary artery without angina pectoris: Secondary | ICD-10-CM | POA: Diagnosis not present

## 2024-02-04 DIAGNOSIS — I10 Essential (primary) hypertension: Secondary | ICD-10-CM | POA: Diagnosis present

## 2024-02-04 DIAGNOSIS — N401 Enlarged prostate with lower urinary tract symptoms: Secondary | ICD-10-CM | POA: Insufficient documentation

## 2024-02-04 DIAGNOSIS — E872 Acidosis, unspecified: Secondary | ICD-10-CM | POA: Insufficient documentation

## 2024-02-04 DIAGNOSIS — E860 Dehydration: Secondary | ICD-10-CM | POA: Insufficient documentation

## 2024-02-04 DIAGNOSIS — K449 Diaphragmatic hernia without obstruction or gangrene: Secondary | ICD-10-CM | POA: Diagnosis not present

## 2024-02-04 DIAGNOSIS — N179 Acute kidney failure, unspecified: Principal | ICD-10-CM | POA: Diagnosis present

## 2024-02-04 LAB — CBC
HCT: 43.5 % (ref 39.0–52.0)
Hemoglobin: 14.1 g/dL (ref 13.0–17.0)
MCH: 29.6 pg (ref 26.0–34.0)
MCHC: 32.4 g/dL (ref 30.0–36.0)
MCV: 91.4 fL (ref 80.0–100.0)
Platelets: 237 K/uL (ref 150–400)
RBC: 4.76 MIL/uL (ref 4.22–5.81)
RDW: 13.8 % (ref 11.5–15.5)
WBC: 10.3 K/uL (ref 4.0–10.5)
nRBC: 0 % (ref 0.0–0.2)

## 2024-02-04 LAB — LACTIC ACID, PLASMA
Lactic Acid, Venous: 2.7 mmol/L (ref 0.5–1.9)
Lactic Acid, Venous: 1.4 mmol/L (ref 0.5–1.9)

## 2024-02-04 LAB — TROPONIN I (HIGH SENSITIVITY)
Troponin I (High Sensitivity): 29 ng/L — ABNORMAL HIGH (ref <18)
Troponin I (High Sensitivity): 47 ng/L — ABNORMAL HIGH (ref <18)

## 2024-02-04 LAB — BASIC METABOLIC PANEL WITH GFR
Anion gap: 12 (ref 5–15)
BUN: 12 mg/dL (ref 8–23)
CO2: 25 mmol/L (ref 22–32)
Calcium: 9.1 mg/dL (ref 8.9–10.3)
Chloride: 103 mmol/L (ref 98–111)
Creatinine, Ser: 1.73 mg/dL — ABNORMAL HIGH (ref 0.61–1.24)
GFR, Estimated: 39 mL/min — ABNORMAL LOW (ref >60)
Glucose, Bld: 125 mg/dL — ABNORMAL HIGH (ref 70–99)
Potassium: 4 mmol/L (ref 3.5–5.1)
Sodium: 140 mmol/L (ref 135–145)

## 2024-02-04 LAB — PROTIME-INR
INR: 1 (ref 0.8–1.2)
Prothrombin Time: 13.6 s (ref 11.4–15.2)

## 2024-02-04 LAB — MAGNESIUM: Magnesium: 2 mg/dL (ref 1.7–2.4)

## 2024-02-04 LAB — BRAIN NATRIURETIC PEPTIDE: B Natriuretic Peptide: 49 pg/mL (ref 0.0–100.0)

## 2024-02-04 LAB — TSH: TSH: 1.449 u[IU]/mL (ref 0.350–4.500)

## 2024-02-04 MED ORDER — SODIUM CHLORIDE 0.9 % IV BOLUS
1000.0000 mL | Freq: Once | INTRAVENOUS | Status: AC
  Administered 2024-02-04: 1000 mL via INTRAVENOUS

## 2024-02-04 MED ORDER — LACTATED RINGERS IV SOLN: INTRAVENOUS | Status: AC

## 2024-02-04 MED ORDER — DILTIAZEM HCL-DEXTROSE 125-5 MG/125ML-% IV SOLN (PREMIX)
5.0000 mg/h | INTRAVENOUS | Status: DC
  Administered 2024-02-04: 5 mg/h via INTRAVENOUS
  Filled 2024-02-04: qty 125

## 2024-02-04 NOTE — ED Provider Notes (Signed)
 [place] EMERGENCY DEPARTMENT AT [facility] Provider Note   CSN: [id number] Arrival date & time: 02/04/24  8166     Patient presents with: Shortness of Breath   [name] [name] is a 83 y.o. male.  He is presenting from home with complaint of feeling weak short of breath fatigued over the last week or so.  Gets tired easily with any type of exertion.  No chest pain.  No fevers.  Has been troubled by prostate issues and has an elevated PSA and had recent infections.  He called his PCP about his breathing and weakness and started on cefuroxime  yesterday.  Took 3 doses without improvement.  Does have cardiac and vascular history but no history of A-fib.  {Add pertinent medical, surgical, social history, OB history to YEP:67052} The history is provided by the patient.  Shortness of Breath Severity:  Moderate Onset quality:  Gradual Duration:  1 week Timing:  Intermittent Progression:  Unchanged Chronicity:  New Relieved by:  Rest Worsened by:  Activity Ineffective treatments: antibiotic. Associated symptoms: diaphoresis   Associated symptoms: no abdominal pain, no chest pain, no cough, no fever, no hemoptysis, no sputum production and no vomiting   Risk factors: no tobacco use        Prior to Admission medications   Medication Sig Start Date End Date Taking? Authorizing Provider  albuterol  (PROVENTIL ) (2.5 MG/3ML) 0.083% nebulizer solution Take 3 mLs (2.5 mg total) by nebulization every 6 (six) hours as needed for wheezing or shortness of breath. 07/26/23   Darlean Ozell NOVAK, MD  albuterol  (VENTOLIN  HFA) 108 (90 Base) MCG/ACT inhaler Inhale 2 puffs into the lungs every 4 (four) hours as needed for wheezing or shortness of breath. 03/19/23   Ula Prentice SAUNDERS, MD  aspirin  EC 81 MG tablet Take 1 tablet (81 mg total) by mouth daily at 6 (six) AM. Swallow whole. 07/31/23   Rhyne, Samantha J, PA-C  budesonide -glycopyrrolate -formoterol  (BREZTRI  AEROSPHERE) 160-9-4.8 MCG/ACT AERO inhaler  Inhale 2 puffs into the lungs in the morning and at bedtime. 01/18/24   Neda Jennet LABOR, MD  cefUROXime  (CEFTIN ) 500 MG tablet Take 1 tablet (500 mg total) by mouth in the morning, at noon, and at bedtime for 14 days. 02/02/24 02/16/24  Cook, Jayce G, DO  cetirizine (ZYRTEC) 10 MG tablet Take 10 mg by mouth daily.    [provider]  COMBIGAN  0.2-0.5 % ophthalmic solution Apply 1 drop to eye 2 (two) times daily. 10/08/20   [provider]  esomeprazole  (NEXIUM ) 40 MG capsule Take 30-60 min before first meal of the day 09/22/23   Darlean Ozell NOVAK, MD  famotidine  (PEPCID ) 20 MG tablet One after supper 09/22/23   Darlean Ozell NOVAK, MD  fluticasone -salmeterol (ADVAIR  HFA) 230-21 MCG/ACT inhaler Take 2 puffs first thing in am and then another 2 puffs about 12 hours later. 10/20/23   Darlean Ozell NOVAK, MD  latanoprost  (XALATAN ) 0.005 % ophthalmic solution Place 1 drop into both eyes at bedtime.    [provider]  LORazepam  (ATIVAN ) 0.5 MG tablet Take 1 tablet (0.5 mg total) by mouth at bedtime as needed. 02/01/24   Cook, Jayce G, DO  losartan  (COZAAR ) 25 MG tablet Take 1 tablet (25 mg total) by mouth daily. 09/19/23   Cook, Jayce G, DO  lovastatin  (MEVACOR ) 40 MG tablet Take 1 tablet (40 mg total) by mouth daily. 12/30/23   Cook, Jayce G, DO  metroNIDAZOLE  (METROGEL ) 0.75 % gel Apply 1 Application topically daily as  needed (rosacea). 06/20/23   [provider]  oxymetazoline  (AFRIN) 0.05 % nasal spray Place 1 spray into both nostrils 2 (two) times daily as needed for congestion.    [provider]  Spacer/Aero-Holding Raguel FRENCH Use as directed 02/17/22   Neda Jennet LABOR, MD  tamsulosin  (FLOMAX ) 0.4 MG CAPS capsule Take 2 capsules (0.8 mg total) by mouth daily. 01/20/24   Cook, Jayce G, DO  triamcinolone  cream (KENALOG ) 0.1 % Apply 1 Application topically 2 (two) times daily as needed (itching). 04/21/23   [provider]    Allergies: Bactrim   [sulfamethoxazole -trimethoprim ], Beta adrenergic blockers, Lasix  [furosemide ], Doxycycline , Myrbetriq  [mirabegron ], Ciprofloxacin , Dexamethasone , Gabapentin , Methocarbamol, Neomycin, Penicillins, and Tetracyclines & related    Review of Systems  Constitutional:  Positive for diaphoresis and fatigue. Negative for fever.  Respiratory:  Positive for shortness of breath. Negative for cough, hemoptysis and sputum production.   Cardiovascular:  Negative for chest pain.  Gastrointestinal:  Negative for abdominal pain and vomiting.  Genitourinary:  Positive for difficulty urinating.    Updated Vital Signs BP (!) 88/74 (BP Location: Right Arm)   Pulse 97   Temp (!) 97.5 F (36.4 C) (Oral)   Resp (!) 24   Ht 6' 1 (1.854 m)   Wt 94.8 kg   SpO2 99%   BMI 27.57 kg/m   Physical Exam Vitals and nursing note reviewed.  Constitutional:      General: He is not in acute distress.    Appearance: He is well-developed.  HENT:     Head: Normocephalic and atraumatic.  Eyes:     Conjunctiva/sclera: Conjunctivae normal.  Cardiovascular:     Rate and Rhythm: Normal rate and regular rhythm.     Heart sounds: No murmur heard. Pulmonary:     Effort: Pulmonary effort is normal. No respiratory distress.     Breath sounds: Normal breath sounds.  Abdominal:     Palpations: Abdomen is soft.     Tenderness: There is no abdominal tenderness.  Musculoskeletal:        General: No swelling.     Cervical back: Neck supple.     Right lower leg: No tenderness. No edema.     Left lower leg: No tenderness. No edema.  Skin:    General: Skin is warm and dry.     Capillary Refill: Capillary refill takes less than 2 seconds.  Neurological:     General: No focal deficit present.     Mental Status: He is alert.     (all labs ordered are listed, but only abnormal results are displayed) Labs Reviewed  BASIC METABOLIC PANEL WITH GFR  CBC  BRAIN NATRIURETIC PEPTIDE  PROTIME-INR  MAGNESIUM   TSH  LACTIC ACID,  PLASMA  LACTIC ACID, PLASMA  TROPONIN I (HIGH SENSITIVITY)    EKG: EKG Interpretation Date/Time:  Saturday February 04 2024 18:54:39 EDT Ventricular Rate:  152 PR Interval:    QRS Duration:  80 QT Interval:  284 QTC Calculation: 451 R Axis:   39  Text Interpretation: Atrial fibrillation with rapid ventricular response Nonspecific ST abnormality Abnormal ECG When compared with ECG of 19-Mar-2023 23:37, increased rate and afib new from prior 11/24 Confirmed by Towana Sharper [phone]) on 02/04/2024 6:59:05 PM  Radiology: DG Chest 2 View Result Date: 02/04/2024 CLINICAL DATA:  Shortness of breath EXAM: CHEST - 2 VIEW COMPARISON:  Chest x-ray 07/29/2023 FINDINGS: The heart size and mediastinal contours are within normal limits. Both lungs are clear. Stable left diaphragmatic hernia. The visualized  skeletal structures are unremarkable. IMPRESSION: No active cardiopulmonary disease. Electronically Signed   By: Greig Pique M.D.   On: 02/04/2024 19:18    {Document cardiac monitor, telemetry assessment procedure when appropriate:32947} Procedures   Medications Ordered in the ED  sodium chloride  0.9 % bolus 1,000 mL (has no administration in time range)  sodium chloride  0.9 % bolus 1,000 mL (1,000 mLs Intravenous New Bag/Given 02/04/24 1919)    Clinical Course as of 02/04/24 2231  Sat Feb 04, 2024  1926 Chest x-ray interpreted by me as no acute infiltrate.  Awaiting radiology reading. [MB]  2057 Lactate elevated.  No infectious symptoms, question this is related to some hypoperfusion with his low blood pressure and elevated heart rate. [MB]  2230 I reviewed EKGs and labs with cardiology Dr. Duffy.  She does agree that he was in A-fib earlier.  With his AKI and troponin slightly bumped she felt he would benefit from admission to the hospital overnight with cardiac monitoring and hydration.  Recheck labs in the morning.  She said if everything normalizes he could probably follow-up outpatient  in cardiology clinic.  Would recommend starting up on apixaban.  I reviewed this with the patient and he is agreeable plan for admission. [MB]    Clinical Course User Index [MB] Towana Ozell BROCKS, MD   {Click here for ABCD2, HEART and other calculators REFRESH Note before signing:1}                              Medical Decision Making Amount and/or Complexity of Data Reviewed Labs: ordered. Radiology: ordered.  Risk Prescription drug management.   This patient complains of ***; this involves an extensive number of treatment Options and is a complaint that carries with it a high risk of complications and morbidity. The differential includes ***  I ordered, reviewed and interpreted labs, which included *** I ordered medication *** and reviewed PMP when indicated. I ordered imaging studies which included *** and I independently    visualized and interpreted imaging which showed *** Additional history obtained from *** Previous records obtained and reviewed *** I consulted *** and discussed lab and imaging findings and discussed disposition.  Cardiac monitoring reviewed, *** Social determinants considered, *** Critical Interventions: ***  After the interventions stated above, I reevaluated the patient and found *** Admission and further testing considered, ***   {Document critical care time when appropriate  Document review of labs and clinical decision tools ie CHADS2VASC2, etc  Document your independent review of radiology images and any outside records  Document your discussion with family members, caretakers and with consultants  Document social determinants of health affecting pt's care  Document your decision making why or why not admission, treatments were needed:32947:::1}   Final diagnoses:  None    ED Discharge Orders     None

## 2024-02-04 NOTE — Progress Notes (Signed)
 Discussed [name] [name] care at the request of Dr. Towana.  This was a telephone discussion I did not physically see [name] [name]. [name] [name] has a history of CAD with PCI to his RCA in 1999, previous intolerance to beta-blockers with symptoms of diarrhea, COPD, BPH, AAA (4.6 cm) who presents to the [facility] emergency department with lightheadedness, shortness of breath, and weakness.  On arrival, he was hypothermic with a temperature 36.4 C, hypotensive with a blood pressure of 88/72.  He was found to be in atrial fibrillation at a rate of 120s-150s (initial EKG rate of 152 bpm).  Labs show a creatinine of 1.73 (baseline 0.79), lactate of 2.7, troponin I 29  > 47. He was started on IV diltiazem  and self converted into normal sinus rhythm at a rate of 96 bpm. Once in sinus rhythm his lactate fell to 1.4.   Dr. Towana called to discuss management. Given his hemodynamic instability with atrial fibrillation with RVR instability is evidenced by a systolic blood pressure in the 80s, lactate 2.6, AKI, I recommended [name] [name] to be admitted for overnight observation. His troponins are likely due to demand as I do not see ischemia on his EKGs. If his labs remain stable and he does not return into atrial fibrillation overnight, he can be discharged with outpatient cardiology follow-up. I recommend initiation of apixaban 5 mg twice daily. [name] [name] has a history of recurrent episodes of epistaxis which this medication may worsen. Recommend discussion regarding risk/benefits. Hold off on the addition of a rate control agents for now given his hypotension. However, if his blood pressures normalized back to his baseline, then can discharge him on diltiazem  30 mg immediate release for heart rates above 120 bpm   Merlene Blood, MD MS  Cardiology [facility]

## 2024-02-04 NOTE — H&P (Signed)
 History and Physical    Patient: [name] [name] FMW:[id number] DOB: 1940-06-19 DOA: 02/04/2024 DOS: the patient was seen and examined on 02/05/2024 PCP: Cook, Jayce G, DO  Patient coming from: Home  Chief Complaint:  Chief Complaint  Patient presents with   Shortness of Breath   HPI: [name] [name] is a 83 y.o. male with medical history significant of CAD s/p PCI, BPH, COPD, AAA who presents to the emergency department due to 1 week onset of fatigue and shortness of breath and this worsens on exertion.  He called his PCPs office and was prescribed with cefuroxime  yesterday, he has taken 3 doses without any improvement.  Shortness of breath and weakness worsened today around 2 PM, he tried home inhaler and realized it was not functional, so EMS was activated.  On arrival of EMS team, he was noted to be hypotensive with a BP of 88/72 and was in atrial fibrillation heart rate of 120s to 150s.  Patient was sent to the ED for further evaluation and management.  ED Course: In the emergency department, temperature was 97.5 F, respiratory rate 24/min, pulse 97 bpm, BP 80/74, O2 sat was 99% on room air.  Workup in the ED showed normal CBC and BMP except for blood glucose of 125 and creatinine of 1.73 (baseline creatinine at 0.8-1.0), lactic acid 2.7 > 1.4, troponin 29 > 47, BNP 14, TSH 1.449, magnesium  2.0 Chest x-ray showed no active cardiopulmonary disease Patient was started on IV Cardizem  drip, IV NS 2 L was provided. Cardiologist (Dr. Duffy) was consulted and recommended observing patient overnight and to discharge with outpatient cardiology follow-up in the morning if patient does not return to atrial fibrillation overnight.  Apixaban 5 mg twice daily was recommended.  TR[facility] was asked to admit patient  Review of Systems: Review of systems as noted in the HPI. All other systems reviewed and are negative.   Past Medical History:  Diagnosis Date   AAA (abdominal aortic aneurysm) (HCC)    needs  yearly ultrasound   Allergy    Anemia    Arthritis    Asthma    BCC (basal cell carcinoma) 08/18/1989   left shoulder blad, upper right arm, left arm beyond elbow, c&d   BCC (basal cell carcinoma) 01/31/1992   Posterior neck, curetx3, 27fu   BCC (basal cell carcinoma) 11/22/2001   mid forehead, cx3, excision, right forearm, cx3, 55fu   BCC (basal cell carcinoma) 10/09/2003   mid forehead, MOHs   BCC (basal cell carcinoma) 08/15/2008   upper left back, biopsy   BPH (benign prostatic hyperplasia)    CAD (coronary artery disease)    Cancer (HCC)    skin cancer   COPD (chronic obstructive pulmonary disease) (HCC)    Dysrhythmia    pt. states it can be fast at times   GERD (gastroesophageal reflux disease)    Glaucoma    History of acute pancreatitis 12/21/2022   HOH (hard of hearing)    Hypercholesterolemia    Hypertension    Impaired fasting glucose    Low back pain    Melanoma in situ (HCC) 10/09/2003   left chin, MOHs   MI (myocardial infarction) (HCC) 1999   Peripheral vascular disease (HCC)    AAA   SCC (squamous cell carcinoma) 07/03/2014   in situ, behind left ear, cx3, cautery, 8fu   SCC (squamous cell carcinoma) 07/03/2014   well diff, left forearm, biopsy, cx1, cautery   SCC (squamous cell carcinoma) 07/20/2017  in situ, left upper arm, cx3, 74fu   SCC (squamous cell carcinoma) 01/10/2019   in situ, left post shoulder, cx3, 62fu   SCC (squamous cell carcinoma) 11/22/2001   left forearm distal, left forearm, cx3, 16fu   SCC (squamous cell carcinoma) 10/09/2003   [place], left ear post, clear per st, right cheek clear   SCC (squamous cell carcinoma) 03/30/2004   in situ, left upper arm, cx3, 76fu   SCC (squamous cell carcinoma) 03/08/2005   in situ, right cheek, mid upper forehead, cx3, 31fu   SCC (squamous cell carcinoma) 06/08/2006   in situ, left shoulder, cx3, 42fu   SCC (squamous cell carcinoma) 05/05/2010   right inner wrist, biopsy   SCC (squamous cell  carcinoma) 09/13/2013   in situ, right crown scalp, front scalp, biopsy   Thrush    Past Surgical History:  Procedure Laterality Date   ABDOMINAL AORTIC ENDOVASCULAR STENT GRAFT N/A 07/29/2023   Procedure: INSERTION, ENDOVASCULAR STENT GRAFT, AORTA, ABDOMINAL;  Surgeon: Gretta Lonni PARAS, MD;  Location: [facility] OR;  Service: Vascular;  Laterality: N/A;   BACK SURGERY     x 3   CARDIAC CATHETERIZATION     angioplasty   CATARACT EXTRACTION W/PHACO  03/20/2012   Procedure: CATARACT EXTRACTION PHACO AND INTRAOCULAR LENS PLACEMENT (IOC);  Surgeon: Dow JULIANNA Burke, MD;  Location: [facility];  Service: Ophthalmology;  Laterality: Right;  CDE:  8.45   CATARACT EXTRACTION W/PHACO Left 04/02/2013   Procedure: CATARACT EXTRACTION PHACO AND INTRAOCULAR LENS PLACEMENT (IOC);  Surgeon: Dow JULIANNA Burke, MD;  Location: [facility];  Service: Ophthalmology;  Laterality: Left;  CDE:  6.50   CHOLECYSTECTOMY  2000   COLONOSCOPY  2009   repeat 5 years   ESOPHAGOGASTRODUODENOSCOPY     HERNIA REPAIR Left    inguinal   INGUINAL HERNIA REPAIR Right 03/28/2020   Procedure: Right Inguinal Herniorrhaphy with Mesh;  Surgeon: Mavis Anes, MD;  Location: [facility];  Service: General;  Laterality: Right;   LAPAROSCOPIC PARTIAL COLECTOMY N/A 06/11/2013   Procedure: LAPAROSCOPIC HAND ASSISTED PARTIAL COLECTOMY;  Surgeon: Anes DELENA Mavis, MD;  Location: [facility];  Service: General;  Laterality: N/A;   NASAL ENDOSCOPY WITH EPISTAXIS CONTROL Bilateral 02/11/2020   Procedure: NASAL ENDOSCOPY WITH EPISTAXIS CONTROL;  Surgeon: Karis Clunes, MD;  Location: [facility];  Service: ENT;  Laterality: Bilateral;   REPAIR ILIAC ARTERY Right 07/29/2023   Procedure: REPAIR, ARTERY, FEMORAL;  Surgeon: Gretta Lonni PARAS, MD;  Location: [facility] OR;  Service: Vascular;  Laterality: Right;   right eye detached retina Bilateral    SPINAL FUSION  2016   ULTRASOUND GUIDANCE FOR VASCULAR ACCESS Bilateral 07/29/2023   Procedure: ULTRASOUND GUIDANCE,  FOR VASCULAR ACCESS;  Surgeon: Gretta Lonni PARAS, MD;  Location: [facility] OR;  Service: Vascular;  Laterality: Bilateral;   YAG LASER APPLICATION Left 05/06/2014   Procedure: YAG LASER APPLICATION;  Surgeon: Dow JULIANNA Burke, MD;  Location: [facility];  Service: Ophthalmology;  Laterality: Left;    Social History:  reports that he quit smoking about 20 years ago. His smoking use included cigarettes. He started smoking about 55 years ago. He has a 35 pack-year smoking history. He has never used smokeless tobacco. He reports that he does not drink alcohol and does not use drugs.   Allergies  Allergen Reactions   Bactrim  [Sulfamethoxazole -Trimethoprim ] Hives   Beta Adrenergic Blockers Diarrhea   Ciprofloxacin  Nausea And Vomiting, Rash and Other (See Comments)    Body aches    Doxycycline   Swelling    Lips swelling and skin peeling around mouth   Lasix  [Furosemide ] Rash   Dexamethasone  Swelling   Gabapentin  Swelling    Legs and feet swelling   Methocarbamol Swelling and Rash   Myrbetriq  [Mirabegron ] Hives and Itching   Neomycin Swelling and Rash   Penicillins Rash, Swelling and Dermatitis   Tetracyclines & Related Itching    Family History  Problem Relation Age of Onset   Hypertension Mother    COPD Father    Cancer Brother        brain     Prior to Admission medications   Medication Sig Start Date End Date Taking? Authorizing Provider  albuterol  (PROVENTIL ) (2.5 MG/3ML) 0.083% nebulizer solution Take 3 mLs (2.5 mg total) by nebulization every 6 (six) hours as needed for wheezing or shortness of breath. 07/26/23   Darlean Ozell NOVAK, MD  albuterol  (VENTOLIN  HFA) 108 (90 Base) MCG/ACT inhaler Inhale 2 puffs into the lungs every 4 (four) hours as needed for wheezing or shortness of breath. 03/19/23   Ula Prentice SAUNDERS, MD  aspirin  EC 81 MG tablet Take 1 tablet (81 mg total) by mouth daily at 6 (six) AM. Swallow whole. 07/31/23   Rhyne, Samantha J, PA-C  budesonide -glycopyrrolate -formoterol  (BREZTRI   AEROSPHERE) 160-9-4.8 MCG/ACT AERO inhaler Inhale 2 puffs into the lungs in the morning and at bedtime. 01/18/24   Olalere, Jennet LABOR, MD  cefUROXime  (CEFTIN ) 500 MG tablet Take 1 tablet (500 mg total) by mouth in the morning, at noon, and at bedtime for 14 days. 02/02/24 02/16/24  Cook, Jayce G, DO  cetirizine (ZYRTEC) 10 MG tablet Take 10 mg by mouth daily.    [provider]  COMBIGAN  0.2-0.5 % ophthalmic solution Apply 1 drop to eye 2 (two) times daily. 10/08/20   [provider]  esomeprazole  (NEXIUM ) 40 MG capsule Take 30-60 min before first meal of the day 09/22/23   Darlean Ozell NOVAK, MD  famotidine  (PEPCID ) 20 MG tablet One after supper 09/22/23   Darlean Ozell NOVAK, MD  fluticasone -salmeterol (ADVAIR  HFA) 230-21 MCG/ACT inhaler Take 2 puffs first thing in am and then another 2 puffs about 12 hours later. 10/20/23   Wert, Michael B, MD  latanoprost  (XALATAN ) 0.005 % ophthalmic solution Place 1 drop into both eyes at bedtime.    [provider]  LORazepam  (ATIVAN ) 0.5 MG tablet Take 1 tablet (0.5 mg total) by mouth at bedtime as needed. 02/01/24   Cook, Jayce G, DO  losartan  (COZAAR ) 25 MG tablet Take 1 tablet (25 mg total) by mouth daily. 09/19/23   Cook, Jayce G, DO  lovastatin  (MEVACOR ) 40 MG tablet Take 1 tablet (40 mg total) by mouth daily. 12/30/23   Cook, Jayce G, DO  metroNIDAZOLE  (METROGEL ) 0.75 % gel Apply 1 Application topically daily as needed (rosacea). 06/20/23   [provider]  oxymetazoline  (AFRIN) 0.05 % nasal spray Place 1 spray into both nostrils 2 (two) times daily as needed for congestion.    [provider]  Spacer/Aero-Holding Raguel FRENCH Use as directed 02/17/22   Neda Jennet LABOR, MD  tamsulosin  (FLOMAX ) 0.4 MG CAPS capsule Take 2 capsules (0.8 mg total) by mouth daily. 01/20/24   Cook, Jayce G, DO  triamcinolone  cream (KENALOG ) 0.1 % Apply 1 Application topically 2 (two) times daily as needed (itching). 04/21/23   [provider]     Physical Exam: BP (!) 164/98 (BP Location: Left Arm)   Pulse 90   Temp (!) 97.4 F (36.3  C) (Oral)   Resp 20   Ht 6' 1 (1.854 m)   Wt 94.8 kg   SpO2 95%   BMI 27.57 kg/m   General: 83 y.o. year-old male well developed well nourished in no acute distress.  Alert and oriented x3. HEENT: NCAT, EOMI, dry mucous membrane. Neck: Supple, trachea medial Cardiovascular: Regular rate and rhythm with no rubs or gallops.  No thyromegaly or JVD noted.  No lower extremity edema. 2/4 pulses in all 4 extremities. Respiratory: Clear to auscultation with no wheezes or rales. Good inspiratory effort. Abdomen: Soft, nontender nondistended with normal bowel sounds x4 quadrants. Muskuloskeletal: No cyanosis, clubbing or edema noted bilaterally Neuro: CN II-XII intact, strength 5/5 x 4, sensation, reflexes intact Skin: No ulcerative lesions noted or rashes Psychiatry: Judgement and insight appear normal. Mood is appropriate for condition and setting          Labs on Admission:  Basic Metabolic Panel: Recent Labs  Lab 02/04/24 1918  NA 140  K 4.0  CL 103  CO2 25  GLUCOSE 125*  BUN 12  CREATININE 1.73*  CALCIUM  9.1  MG 2.0   Liver Function Tests: No results for input(s): AST, ALT, ALKPHOS, BILITOT, PROT, ALBUMIN  in the last 168 hours. No results for input(s): LIPASE, AMYLASE in the last 168 hours. No results for input(s): AMMONIA in the last 168 hours. CBC: Recent Labs  Lab 02/04/24 1918  WBC 10.3  HGB 14.1  HCT 43.5  MCV 91.4  PLT 237   Cardiac Enzymes: No results for input(s): CKTOTAL, CKMB, CKMBINDEX, TROPONINI in the last 168 hours.  BNP (last 3 results) Recent Labs    10/20/23 1136 12/14/23 1334 02/04/24 1918  BNP 47.4 68.7 49.0    ProBNP (last 3 results) No results for input(s): PROBNP in the last 8760 hours.  CBG: No results for input(s): GLUCAP in the last 168 hours.  Radiological Exams on Admission: DG Chest 2 View Result  Date: 02/04/2024 CLINICAL DATA:  Shortness of breath EXAM: CHEST - 2 VIEW COMPARISON:  Chest x-ray 07/29/2023 FINDINGS: The heart size and mediastinal contours are within normal limits. Both lungs are clear. Stable left diaphragmatic hernia. The visualized skeletal structures are unremarkable. IMPRESSION: No active cardiopulmonary disease. Electronically Signed   By: Greig Pique M.D.   On: 02/04/2024 19:18    EKG: I independently viewed the EKG done and my findings are as followed: Initial EKG showed atrial fibrillation with RVR with nonspecific ST wave abnormality Subsequent EKG personally reviewed showed normal sinus rhythm at rate of 96 bpm with APCs   Assessment/Plan Present on Admission:  Acute kidney injury (HCC)  AAA (abdominal aortic aneurysm) (HCC)  GERD (gastroesophageal reflux disease)  Essential hypertension  Mixed hyperlipidemia  BPH (benign prostatic hyperplasia)  COPD (chronic obstructive pulmonary disease) (HCC)  Epistaxis  Principal Problem:   Acute kidney injury (HCC) Active Problems:   Mixed hyperlipidemia   AAA (abdominal aortic aneurysm) (HCC)   GERD (gastroesophageal reflux disease)   Essential hypertension   COPD (chronic obstructive pulmonary disease) (HCC)   BPH (benign prostatic hyperplasia)   Epistaxis   Dehydration   Lactic acidosis   Elevated troponin  Acute kidney injury Dehydration Creatinine of 1.73 (baseline creatinine at 0.8-1.0) Continue gentle hydration Renally adjust medications, avoid nephrotoxic agents/dehydration/hypotension  Lactic acidosis-resolved Lactic acid 2.7 > 1.4  Paroxysmal atrial fibrillation with RVR-resolved Patient was started on IV Cardizem  drip and a converted to normal sinus rhythm.  The drip was turned off. If patient's BP  is not in hypotensive range in the morning, consider starting patient on diltiazem  for HR > 120 bpm (per cardiologist recommendation) Patient has a history of epistaxis and endorsed nosebleed in  the last 2 nights.  Risk of bleeding with Eliquis (as recommended by cardiologist) was discussed with patient.  He will think about it overnight and decide on anticoagulation prior to discharge.  Elevated troponin possibly secondary to type II demand ischemia Troponin 29 > 47; he denies chest pain.  Continue to trend troponin  BPH  Continue Flomax   Essential hypertension BP meds will be held at this time due to soft BP  Mixed hyperlipidemia Continue pravastatin   GERD Continue PPI, Pepcid   COPD Continue Breztri   Allergic rhinitis/epistaxis Continue Afrin  History of AAA Stable, patient is s/p stent graft repair (03/25).  DVT prophylaxis: Heparin  subcu  Code Status: Full code  Family Communication: None at bedside  Consults: None  Severity of Illness: The appropriate patient status for this patient is OBSERVATION. Observation status is judged to be reasonable and necessary in order to provide the required intensity of service to ensure the patient's safety. The patient's presenting symptoms, physical exam findings, and initial radiographic and laboratory data in the context of their medical condition is felt to place them at decreased risk for further clinical deterioration. Furthermore, it is anticipated that the patient will be medically stable for discharge from the hospital within 2 midnights of admission.   Author: Amandeep Nesmith, DO 02/05/2024 12:23 AM  For on call review [email or web address].

## 2024-02-04 NOTE — ED Triage Notes (Signed)
 Pt has hx of COPD and has been dealing with prostate infection as well. Pt stated that around 1400 today, he began getting SOB, dizzy and weak. Pt stated that he has rescue inhaler at home but it did not work. 99% on RA in triage.

## 2024-02-04 NOTE — H&P (Incomplete)
 History and Physical    Patient: [name] [name] FMW:[id number] DOB: 03-06-1941 DOA: 02/04/2024 DOS: the patient was seen and examined on 02/04/2024 PCP: Cook, Jayce G, DO  Patient coming from: Home  Chief Complaint:  Chief Complaint  Patient presents with  . Shortness of Breath   HPI: [name] [name] is a 83 y.o. male with medical history significant of CAD s/p PCI, BPH, COPD, AAA who presents to the emergency department due to 1 week onset of fatigue and shortness of breath and this worsens on exertion.  He called his PCPs office and was prescribed with cefuroxime  yesterday, he has taken 3 doses without any improvement.  Shortness of breath and weakness worsened today around 2 PM, he tried home inhaler and realized it was not functional, so EMS was activated.  On arrival of EMS team, he was noted to be hypotensive with a BP of 88/72 and was in atrial fibrillation heart rate of 120s to 150s.  Patient was sent to the ED for further evaluation and management.  ED Course: In the emergency department, temperature was 97.5 F, respiratory rate 24/min, pulse 97 bpm, BP 80/74, O2 sat was 99% on room air.  Workup in the ED showed normal CBC and BMP except for blood glucose of 125 and creatinine of 1.73 (baseline creatinine at 0.8-1.0), lactic acid 2.7 > 1.4, troponin 29 > 47, BNP 14, TSH 1.449, magnesium  2.0 Chest x-ray showed no active cardiopulmonary disease Patient was started on IV Cardizem  drip, IV NS 2 L was provided. Cardiologist (Dr. Duffy) was consulted and recommended observing patient overnight and to discharge with outpatient cardiology follow-up in the morning if patient does not return to atrial fibrillation overnight.  Apixaban 5 mg twice daily was recommended.  TR[facility] was asked to admit patient  Review of Systems: Review of systems as noted in the HPI. All other systems reviewed and are negative.   Past Medical History:  Diagnosis Date  . AAA (abdominal aortic aneurysm) (HCC)    needs  yearly ultrasound  . Allergy   . Anemia   . Arthritis   . Asthma   . BCC (basal cell carcinoma) 08/18/1989   left shoulder blad, upper right arm, left arm beyond elbow, c&d  . BCC (basal cell carcinoma) 01/31/1992   Posterior neck, curetx3, 62fu  . BCC (basal cell carcinoma) 11/22/2001   mid forehead, cx3, excision, right forearm, cx3, 12fu  . BCC (basal cell carcinoma) 10/09/2003   mid forehead, MOHs  . BCC (basal cell carcinoma) 08/15/2008   upper left back, biopsy  . BPH (benign prostatic hyperplasia)   . CAD (coronary artery disease)   . Cancer (HCC)    skin cancer  . COPD (chronic obstructive pulmonary disease) (HCC)   . Dysrhythmia    pt. states it can be fast at times  . GERD (gastroesophageal reflux disease)   . Glaucoma   . History of acute pancreatitis 12/21/2022  . HOH (hard of hearing)   . Hypercholesterolemia   . Hypertension   . Impaired fasting glucose   . Low back pain   . Melanoma in situ (HCC) 10/09/2003   left chin, MOHs  . MI (myocardial infarction) (HCC) 1999  . Peripheral vascular disease (HCC)    AAA  . SCC (squamous cell carcinoma) 07/03/2014   in situ, behind left ear, cx3, cautery, 32fu  . SCC (squamous cell carcinoma) 07/03/2014   well diff, left forearm, biopsy, cx1, cautery  . SCC (squamous cell carcinoma) 07/20/2017  in situ, left upper arm, cx3, 53fu  . SCC (squamous cell carcinoma) 01/10/2019   in situ, left post shoulder, cx3, 54fu  . SCC (squamous cell carcinoma) 11/22/2001   left forearm distal, left forearm, cx3, 43fu  . SCC (squamous cell carcinoma) 10/09/2003   Bowens, left ear post, clear per st, right cheek clear  . SCC (squamous cell carcinoma) 03/30/2004   in situ, left upper arm, cx3, 96fu  . SCC (squamous cell carcinoma) 03/08/2005   in situ, right cheek, mid upper forehead, cx3, 22fu  . SCC (squamous cell carcinoma) 06/08/2006   in situ, left shoulder, cx3, 101fu  . SCC (squamous cell carcinoma) 05/05/2010   right inner wrist,  biopsy  . SCC (squamous cell carcinoma) 09/13/2013   in situ, right crown scalp, front scalp, biopsy  . Thrush    Past Surgical History:  Procedure Laterality Date  . ABDOMINAL AORTIC ENDOVASCULAR STENT GRAFT N/A 07/29/2023   Procedure: INSERTION, ENDOVASCULAR STENT GRAFT, AORTA, ABDOMINAL;  Surgeon: Gretta Lonni PARAS, MD;  Location: [facility];  Service: Vascular;  Laterality: N/A;  . BACK SURGERY     x 3  . CARDIAC CATHETERIZATION     angioplasty  . CATARACT EXTRACTION W/PHACO  03/20/2012   Procedure: CATARACT EXTRACTION PHACO AND INTRAOCULAR LENS PLACEMENT (IOC);  Surgeon: Dow JULIANNA Burke, MD;  Location: [facility];  Service: Ophthalmology;  Laterality: Right;  CDE:  8.45  . CATARACT EXTRACTION W/PHACO Left 04/02/2013   Procedure: CATARACT EXTRACTION PHACO AND INTRAOCULAR LENS PLACEMENT (IOC);  Surgeon: Dow JULIANNA Burke, MD;  Location: [facility];  Service: Ophthalmology;  Laterality: Left;  CDE:  6.50  . CHOLECYSTECTOMY  2000  . COLONOSCOPY  2009   repeat 5 years  . ESOPHAGOGASTRODUODENOSCOPY    . HERNIA REPAIR Left    inguinal  . INGUINAL HERNIA REPAIR Right 03/28/2020   Procedure: Right Inguinal Herniorrhaphy with Mesh;  Surgeon: Mavis Anes, MD;  Location: [facility];  Service: General;  Laterality: Right;  . LAPAROSCOPIC PARTIAL COLECTOMY N/A 06/11/2013   Procedure: LAPAROSCOPIC HAND ASSISTED PARTIAL COLECTOMY;  Surgeon: Anes DELENA Mavis, MD;  Location: [facility];  Service: General;  Laterality: N/A;  . NASAL ENDOSCOPY WITH EPISTAXIS CONTROL Bilateral 02/11/2020   Procedure: NASAL ENDOSCOPY WITH EPISTAXIS CONTROL;  Surgeon: Karis Clunes, MD;  Location: [facility];  Service: ENT;  Laterality: Bilateral;  . REPAIR ILIAC ARTERY Right 07/29/2023   Procedure: REPAIR, ARTERY, FEMORAL;  Surgeon: Gretta Lonni PARAS, MD;  Location: [facility] OR;  Service: Vascular;  Laterality: Right;  . right eye detached retina Bilateral   . SPINAL FUSION  2016  . ULTRASOUND GUIDANCE FOR VASCULAR ACCESS Bilateral  07/29/2023   Procedure: ULTRASOUND GUIDANCE, FOR VASCULAR ACCESS;  Surgeon: Gretta Lonni PARAS, MD;  Location: [facility];  Service: Vascular;  Laterality: Bilateral;  . YAG LASER APPLICATION Left 05/06/2014   Procedure: YAG LASER APPLICATION;  Surgeon: Dow JULIANNA Burke, MD;  Location: [facility];  Service: Ophthalmology;  Laterality: Left;    Social History:  reports that he quit smoking about 20 years ago. His smoking use included cigarettes. He started smoking about 55 years ago. He has a 35 pack-year smoking history. He has never used smokeless tobacco. He reports that he does not drink alcohol and does not use drugs.   Allergies  Allergen Reactions  . Bactrim  [Sulfamethoxazole -Trimethoprim ] Hives  . Beta Adrenergic Blockers Diarrhea  . Ciprofloxacin  Nausea And Vomiting, Rash and Other (See Comments)    Body aches   . Doxycycline   Swelling    Lips swelling and skin peeling around mouth  . Lasix  [Furosemide ] Rash  . Dexamethasone  Swelling  . Gabapentin  Swelling    Legs and feet swelling  . Methocarbamol Swelling and Rash  . Myrbetriq  [Mirabegron ] Hives and Itching  . Neomycin Swelling and Rash  . Penicillins Rash, Swelling and Dermatitis  . Tetracyclines & Related Itching    Family History  Problem Relation Age of Onset  . Hypertension Mother   . COPD Father   . Cancer Brother        brain    ***  Prior to Admission medications   Medication Sig Start Date End Date Taking? Authorizing Provider  albuterol  (PROVENTIL ) (2.5 MG/3ML) 0.083% nebulizer solution Take 3 mLs (2.5 mg total) by nebulization every 6 (six) hours as needed for wheezing or shortness of breath. 07/26/23   Darlean Ozell NOVAK, MD  albuterol  (VENTOLIN  HFA) 108 [phone] Base) MCG/ACT inhaler Inhale 2 puffs into the lungs every 4 (four) hours as needed for wheezing or shortness of breath. 03/19/23   Ula Prentice SAUNDERS, MD  aspirin  EC 81 MG tablet Take 1 tablet (81 mg total) by mouth daily at 6 (six) AM. Swallow whole. 07/31/23   Rhyne,  Samantha J, PA-C  budesonide -glycopyrrolate -formoterol  (BREZTRI  AEROSPHERE) 160-9-4.8 MCG/ACT AERO inhaler Inhale 2 puffs into the lungs in the morning and at bedtime. 01/18/24   Olalere, Jennet LABOR, MD  cefUROXime  (CEFTIN ) 500 MG tablet Take 1 tablet (500 mg total) by mouth in the morning, at noon, and at bedtime for 14 days. 02/02/24 02/16/24  Cook, Jayce G, DO  cetirizine (ZYRTEC) 10 MG tablet Take 10 mg by mouth daily.    [provider]  COMBIGAN  0.2-0.5 % ophthalmic solution Apply 1 drop to eye 2 (two) times daily. 10/08/20   [provider]  esomeprazole  (NEXIUM ) 40 MG capsule Take 30-60 min before first meal of the day 09/22/23   Darlean Ozell NOVAK, MD  famotidine  (PEPCID ) 20 MG tablet One after supper 09/22/23   Darlean Ozell NOVAK, MD  fluticasone -salmeterol (ADVAIR  HFA) 230-21 MCG/ACT inhaler Take 2 puffs first thing in am and then another 2 puffs about 12 hours later. 10/20/23   Darlean Ozell NOVAK, MD  latanoprost  (XALATAN ) 0.005 % ophthalmic solution Place 1 drop into both eyes at bedtime.    [provider]  LORazepam  (ATIVAN ) 0.5 MG tablet Take 1 tablet (0.5 mg total) by mouth at bedtime as needed. 02/01/24   Cook, Jayce G, DO  losartan  (COZAAR ) 25 MG tablet Take 1 tablet (25 mg total) by mouth daily. 09/19/23   Cook, Jayce G, DO  lovastatin  (MEVACOR ) 40 MG tablet Take 1 tablet (40 mg total) by mouth daily. 12/30/23   Cook, Jayce G, DO  metroNIDAZOLE  (METROGEL ) 0.75 % gel Apply 1 Application topically daily as needed (rosacea). 06/20/23   [provider]  oxymetazoline  (AFRIN) 0.05 % nasal spray Place 1 spray into both nostrils 2 (two) times daily as needed for congestion.    [provider]  Spacer/Aero-Holding Raguel FRENCH Use as directed 02/17/22   Neda Jennet LABOR, MD  tamsulosin  (FLOMAX ) 0.4 MG CAPS capsule Take 2 capsules (0.8 mg total) by mouth daily. 01/20/24   Cook, Jayce G, DO  triamcinolone  cream (KENALOG ) 0.1 % Apply 1 Application topically 2 (two) times  daily as needed (itching). 04/21/23   [provider]    Physical Exam: BP 126/71   Pulse 79   Temp (!) 97.5 F (36.4 C) (Oral)  Resp 20   Ht 6' 1 (1.854 m)   Wt 94.8 kg   SpO2 97%   BMI 27.57 kg/m   General: 83 y.o. year-old male well developed well nourished in no acute distress.  Alert and oriented x3. HEENT: NCAT, EOMI, dry mucous membrane. Neck: Supple, trachea medial Cardiovascular: Regular rate and rhythm with no rubs or gallops.  No thyromegaly or JVD noted.  No lower extremity edema. 2/4 pulses in all 4 extremities. Respiratory: Clear to auscultation with no wheezes or rales. Good inspiratory effort. Abdomen: Soft, nontender nondistended with normal bowel sounds x4 quadrants. Muskuloskeletal: No cyanosis, clubbing or edema noted bilaterally Neuro: CN II-XII intact, strength 5/5 x 4, sensation, reflexes intact Skin: No ulcerative lesions noted or rashes Psychiatry: Judgement and insight appear normal. Mood is appropriate for condition and setting          Labs on Admission:  Basic Metabolic Panel: Recent Labs  Lab 02/04/24 1918  NA 140  K 4.0  CL 103  CO2 25  GLUCOSE 125*  BUN 12  CREATININE 1.73*  CALCIUM  9.1  MG 2.0   Liver Function Tests: No results for input(s): AST, ALT, ALKPHOS, BILITOT, PROT, ALBUMIN  in the last 168 hours. No results for input(s): LIPASE, AMYLASE in the last 168 hours. No results for input(s): AMMONIA in the last 168 hours. CBC: Recent Labs  Lab 02/04/24 1918  WBC 10.3  HGB 14.1  HCT 43.5  MCV 91.4  PLT 237   Cardiac Enzymes: No results for input(s): CKTOTAL, CKMB, CKMBINDEX, TROPONINI in the last 168 hours.  BNP (last 3 results) Recent Labs    10/20/23 1136 12/14/23 1334 02/04/24 1918  BNP 47.4 68.7 49.0    ProBNP (last 3 results) No results for input(s): PROBNP in the last 8760 hours.  CBG: No results for input(s): GLUCAP in the last 168 hours.  Radiological Exams on  Admission: DG Chest 2 View Result Date: 02/04/2024 CLINICAL DATA:  Shortness of breath EXAM: CHEST - 2 VIEW COMPARISON:  Chest x-ray 07/29/2023 FINDINGS: The heart size and mediastinal contours are within normal limits. Both lungs are clear. Stable left diaphragmatic hernia. The visualized skeletal structures are unremarkable. IMPRESSION: No active cardiopulmonary disease. Electronically Signed   By: Greig Pique M.D.   On: 02/04/2024 19:18    EKG: I independently viewed the EKG done and my findings are as followed: Initial EKG showed atrial fibrillation with RVR with nonspecific ST wave abnormality Subsequent EKG personally reviewed showed normal sinus rhythm at rate of 96 bpm with APCs   Assessment/Plan Present on Admission: . Acute kidney injury (HCC)  Principal Problem:   Acute kidney injury (HCC)  Acute kidney injury Dehydration Creatinine of 1.73 (baseline creatinine at 0.8-1.0) Continue gentle hydration Renally adjust medications, avoid nephrotoxic agents/dehydration/hypotension  Lactic acidosis-resolved Lactic acid 2.7 > 1.4  Paroxysmal atrial fibrillation with RVR-resolved Patient was started on IV Cardizem  drip and a converted to normal sinus rhythm.  The drip was turned off. If patient's BP is not in hypotensive range in the morning, consider starting patient on diltiazem  for HR > 120 bpm (per cardiologist recommendation) Patient has a history of epistaxis and endorsed nosebleed in the last 2 nights.  Risk of bleeding with Eliquis (as recommended by cardiologist) was discussed with patient.  He will think about it overnight and decide on anticoagulation prior to discharge.  Elevated troponin possibly secondary to type II demand ischemia Troponin 29 > 47; he denies chest pain.  Continue to trend troponin  BPH AAA      DVT prophylaxis: ***   Code Status: ***   Family Communication: ***   Disposition Plan: ***   Consults called: ***   Admission status: ***      Posey Maier MD [facility] Pager [phone]  If 7PM-7AM, please contact night-coverage [email or web address] Password [email or web address]  02/04/2024, 11:10 PM       Review of Systems: {ROS_Text:26778} Past Medical History:  Diagnosis Date  . AAA (abdominal aortic aneurysm) (HCC)    needs yearly ultrasound  . Allergy   . Anemia   . Arthritis   . Asthma   . BCC (basal cell carcinoma) 08/18/1989   left shoulder blad, upper right arm, left arm beyond elbow, c&d  . BCC (basal cell carcinoma) 01/31/1992   Posterior neck, curetx3, 6fu  . BCC (basal cell carcinoma) 11/22/2001   mid forehead, cx3, excision, right forearm, cx3, 30fu  . BCC (basal cell carcinoma) 10/09/2003   mid forehead, MOHs  . BCC (basal cell carcinoma) 08/15/2008   upper left back, biopsy  . BPH (benign prostatic hyperplasia)   . CAD (coronary artery disease)   . Cancer (HCC)    skin cancer  . COPD (chronic obstructive pulmonary disease) (HCC)   . Dysrhythmia    pt. states it can be fast at times  . GERD (gastroesophageal reflux disease)   . Glaucoma   . History of acute pancreatitis 12/21/2022  . HOH (hard of hearing)   . Hypercholesterolemia   . Hypertension   . Impaired fasting glucose   . Low back pain   . Melanoma in situ (HCC) 10/09/2003   left chin, MOHs  . MI (myocardial infarction) (HCC) 1999  . Peripheral vascular disease (HCC)    AAA  . SCC (squamous cell carcinoma) 07/03/2014   in situ, behind left ear, cx3, cautery, 56fu  . SCC (squamous cell carcinoma) 07/03/2014   well diff, left forearm, biopsy, cx1, cautery  . SCC (squamous cell carcinoma) 07/20/2017   in situ, left upper arm, cx3, 72fu  . SCC (squamous cell carcinoma) 01/10/2019   in situ, left post shoulder, cx3, 71fu  . SCC (squamous cell carcinoma) 11/22/2001   left forearm distal, left forearm, cx3, 87fu  . SCC (squamous cell carcinoma) 10/09/2003   Bowens, left ear post, clear per st, right cheek clear  . SCC (squamous cell  carcinoma) 03/30/2004   in situ, left upper arm, cx3, 33fu  . SCC (squamous cell carcinoma) 03/08/2005   in situ, right cheek, mid upper forehead, cx3, 43fu  . SCC (squamous cell carcinoma) 06/08/2006   in situ, left shoulder, cx3, 68fu  . SCC (squamous cell carcinoma) 05/05/2010   right inner wrist, biopsy  . SCC (squamous cell carcinoma) 09/13/2013   in situ, right crown scalp, front scalp, biopsy  . Thrush    Past Surgical History:  Procedure Laterality Date  . ABDOMINAL AORTIC ENDOVASCULAR STENT GRAFT N/A 07/29/2023   Procedure: INSERTION, ENDOVASCULAR STENT GRAFT, AORTA, ABDOMINAL;  Surgeon: Gretta Lonni PARAS, MD;  Location: [facility] OR;  Service: Vascular;  Laterality: N/A;  . BACK SURGERY     x 3  . CARDIAC CATHETERIZATION     angioplasty  . CATARACT EXTRACTION W/PHACO  03/20/2012   Procedure: CATARACT EXTRACTION PHACO AND INTRAOCULAR LENS PLACEMENT (IOC);  Surgeon: Dow JULIANNA Burke, MD;  Location: [facility];  Service: Ophthalmology;  Laterality: Right;  CDE:  8.45  . CATARACT EXTRACTION W/PHACO Left 04/02/2013   Procedure: CATARACT EXTRACTION PHACO AND INTRAOCULAR LENS  PLACEMENT (IOC);  Surgeon: Dow JULIANNA Burke, MD;  Location: [facility];  Service: Ophthalmology;  Laterality: Left;  CDE:  6.50  . CHOLECYSTECTOMY  2000  . COLONOSCOPY  2009   repeat 5 years  . ESOPHAGOGASTRODUODENOSCOPY    . HERNIA REPAIR Left    inguinal  . INGUINAL HERNIA REPAIR Right 03/28/2020   Procedure: Right Inguinal Herniorrhaphy with Mesh;  Surgeon: Mavis Anes, MD;  Location: [facility];  Service: General;  Laterality: Right;  . LAPAROSCOPIC PARTIAL COLECTOMY N/A 06/11/2013   Procedure: LAPAROSCOPIC HAND ASSISTED PARTIAL COLECTOMY;  Surgeon: Anes DELENA Mavis, MD;  Location: [facility];  Service: General;  Laterality: N/A;  . NASAL ENDOSCOPY WITH EPISTAXIS CONTROL Bilateral 02/11/2020   Procedure: NASAL ENDOSCOPY WITH EPISTAXIS CONTROL;  Surgeon: Karis Clunes, MD;  Location: [facility];  Service: ENT;   Laterality: Bilateral;  . REPAIR ILIAC ARTERY Right 07/29/2023   Procedure: REPAIR, ARTERY, FEMORAL;  Surgeon: Gretta Lonni PARAS, MD;  Location: [facility] OR;  Service: Vascular;  Laterality: Right;  . right eye detached retina Bilateral   . SPINAL FUSION  2016  . ULTRASOUND GUIDANCE FOR VASCULAR ACCESS Bilateral 07/29/2023   Procedure: ULTRASOUND GUIDANCE, FOR VASCULAR ACCESS;  Surgeon: Gretta Lonni PARAS, MD;  Location: [facility] OR;  Service: Vascular;  Laterality: Bilateral;  . YAG LASER APPLICATION Left 05/06/2014   Procedure: YAG LASER APPLICATION;  Surgeon: Dow JULIANNA Burke, MD;  Location: [facility];  Service: Ophthalmology;  Laterality: Left;   Social History:  reports that he quit smoking about 20 years ago. His smoking use included cigarettes. He started smoking about 55 years ago. He has a 35 pack-year smoking history. He has never used smokeless tobacco. He reports that he does not drink alcohol and does not use drugs.  Allergies  Allergen Reactions  . Bactrim  [Sulfamethoxazole -Trimethoprim ] Hives  . Beta Adrenergic Blockers Diarrhea  . Ciprofloxacin  Nausea And Vomiting, Rash and Other (See Comments)    Body aches   . Doxycycline  Swelling    Lips swelling and skin peeling around mouth  . Lasix  [Furosemide ] Rash  . Dexamethasone  Swelling  . Gabapentin  Swelling    Legs and feet swelling  . Methocarbamol Swelling and Rash  . Myrbetriq  [Mirabegron ] Hives and Itching  . Neomycin Swelling and Rash  . Penicillins Rash, Swelling and Dermatitis  . Tetracyclines & Related Itching    Family History  Problem Relation Age of Onset  . Hypertension Mother   . COPD Father   . Cancer Brother        brain    Prior to Admission medications   Medication Sig Start Date End Date Taking? Authorizing Provider  albuterol  (PROVENTIL ) (2.5 MG/3ML) 0.083% nebulizer solution Take 3 mLs (2.5 mg total) by nebulization every 6 (six) hours as needed for wheezing or shortness of breath. 07/26/23   Darlean Ozell NOVAK, MD  albuterol  (VENTOLIN  HFA) 108 (90 Base) MCG/ACT inhaler Inhale 2 puffs into the lungs every 4 (four) hours as needed for wheezing or shortness of breath. 03/19/23   Ula Prentice SAUNDERS, MD  aspirin  EC 81 MG tablet Take 1 tablet (81 mg total) by mouth daily at 6 (six) AM. Swallow whole. 07/31/23   Rhyne, Samantha J, PA-C  budesonide -glycopyrrolate -formoterol  (BREZTRI  AEROSPHERE) 160-9-4.8 MCG/ACT AERO inhaler Inhale 2 puffs into the lungs in the morning and at bedtime. 01/18/24   Neda Jennet DELENA, MD  cefUROXime  (CEFTIN ) 500 MG tablet Take 1 tablet (500 mg total) by mouth in the morning, at noon, and at  bedtime for 14 days. 02/02/24 02/16/24  Cook, Jayce G, DO  cetirizine (ZYRTEC) 10 MG tablet Take 10 mg by mouth daily.    [provider]  COMBIGAN  0.2-0.5 % ophthalmic solution Apply 1 drop to eye 2 (two) times daily. 10/08/20   [provider]  esomeprazole  (NEXIUM ) 40 MG capsule Take 30-60 min before first meal of the day 09/22/23   Darlean Ozell NOVAK, MD  famotidine  (PEPCID ) 20 MG tablet One after supper 09/22/23   Darlean Ozell NOVAK, MD  fluticasone -salmeterol (ADVAIR  HFA) 230-21 MCG/ACT inhaler Take 2 puffs first thing in am and then another 2 puffs about 12 hours later. 10/20/23   Darlean Ozell NOVAK, MD  latanoprost  (XALATAN ) 0.005 % ophthalmic solution Place 1 drop into both eyes at bedtime.    [provider]  LORazepam  (ATIVAN ) 0.5 MG tablet Take 1 tablet (0.5 mg total) by mouth at bedtime as needed. 02/01/24   Cook, Jayce G, DO  losartan  (COZAAR ) 25 MG tablet Take 1 tablet (25 mg total) by mouth daily. 09/19/23   Cook, Jayce G, DO  lovastatin  (MEVACOR ) 40 MG tablet Take 1 tablet (40 mg total) by mouth daily. 12/30/23   Cook, Jayce G, DO  metroNIDAZOLE  (METROGEL ) 0.75 % gel Apply 1 Application topically daily as needed (rosacea). 06/20/23   [provider]  oxymetazoline  (AFRIN) 0.05 % nasal spray Place 1 spray into both nostrils 2 (two) times daily as needed for congestion.     [provider]  Spacer/Aero-Holding Raguel FRENCH Use as directed 02/17/22   Neda Jennet LABOR, MD  tamsulosin  (FLOMAX ) 0.4 MG CAPS capsule Take 2 capsules (0.8 mg total) by mouth daily. 01/20/24   Cook, Jayce G, DO  triamcinolone  cream (KENALOG ) 0.1 % Apply 1 Application topically 2 (two) times daily as needed (itching). 04/21/23   [provider]    Physical Exam: Vitals:   02/04/24 2145 02/04/24 2200 02/04/24 2215 02/04/24 2230  BP: 98/63 111/65 107/89 126/71  Pulse: 81 79 80 79  Resp: (!) 9 13 20 20   Temp:      TempSrc:      SpO2: 98% 97% 94% 97%  Weight:      Height:       *** Data Reviewed: {Tip this will not be part of the note when signed- Document your independent interpretation of telemetry tracing, EKG, lab, Radiology test or any other diagnostic tests. Add any new diagnostic test ordered today. (Optional):26781} {Results:26384}  Assessment and Plan: No notes have been filed under this hospital service. Service: Hospitalist     Advance Care Planning:   Code Status: Prior ***  Consults: ***  Family Communication: ***  Severity of Illness: {Observation/Inpatient:21159}  Author: Posey Maier, DO 02/04/2024 11:02 PM  For on call review [email or web address].

## 2024-02-05 ENCOUNTER — Observation Stay (HOSPITAL_BASED_OUTPATIENT_CLINIC_OR_DEPARTMENT_OTHER)

## 2024-02-05 DIAGNOSIS — N179 Acute kidney failure, unspecified: Secondary | ICD-10-CM | POA: Diagnosis not present

## 2024-02-05 DIAGNOSIS — I480 Paroxysmal atrial fibrillation: Secondary | ICD-10-CM | POA: Insufficient documentation

## 2024-02-05 DIAGNOSIS — E872 Acidosis, unspecified: Secondary | ICD-10-CM | POA: Insufficient documentation

## 2024-02-05 DIAGNOSIS — I4891 Unspecified atrial fibrillation: Secondary | ICD-10-CM

## 2024-02-05 DIAGNOSIS — E860 Dehydration: Secondary | ICD-10-CM | POA: Insufficient documentation

## 2024-02-05 DIAGNOSIS — R7989 Other specified abnormal findings of blood chemistry: Secondary | ICD-10-CM | POA: Insufficient documentation

## 2024-02-05 LAB — ECHOCARDIOGRAM COMPLETE
AR max vel: 2.82 cm2
AV Area VTI: 2.76 cm2
AV Area mean vel: 2.73 cm2
AV Mean grad: 5.3 mmHg
AV Peak grad: 9 mmHg
Ao pk vel: 1.5 m/s
Area-P 1/2: 3.22 cm2
Calc EF: 55.7 %
Height: 73 in
S' Lateral: 4.1 cm
Single Plane A2C EF: 59.3 %
Single Plane A4C EF: 54.3 %
Weight: 3344 [oz_av]

## 2024-02-05 LAB — CBC
HCT: 38.6 % — ABNORMAL LOW (ref 39.0–52.0)
Hemoglobin: 12.3 g/dL — ABNORMAL LOW (ref 13.0–17.0)
MCH: 29.4 pg (ref 26.0–34.0)
MCHC: 31.9 g/dL (ref 30.0–36.0)
MCV: 92.3 fL (ref 80.0–100.0)
Platelets: 203 K/uL (ref 150–400)
RBC: 4.18 MIL/uL — ABNORMAL LOW (ref 4.22–5.81)
RDW: 13.9 % (ref 11.5–15.5)
WBC: 8.4 K/uL (ref 4.0–10.5)
nRBC: 0 % (ref 0.0–0.2)

## 2024-02-05 LAB — URINALYSIS, W/ REFLEX TO CULTURE (INFECTION SUSPECTED)
Bacteria, UA: NONE SEEN
Bilirubin Urine: NEGATIVE
Glucose, UA: NEGATIVE mg/dL
Hgb urine dipstick: NEGATIVE
Ketones, ur: NEGATIVE mg/dL
Leukocytes,Ua: NEGATIVE
Nitrite: NEGATIVE
Protein, ur: NEGATIVE mg/dL
Specific Gravity, Urine: 1.015 (ref 1.005–1.030)
pH: 6 (ref 5.0–8.0)

## 2024-02-05 LAB — COMPREHENSIVE METABOLIC PANEL WITH GFR
ALT: 20 U/L (ref 0–44)
AST: 24 U/L (ref 15–41)
Albumin: 2.9 g/dL — ABNORMAL LOW (ref 3.5–5.0)
Alkaline Phosphatase: 50 U/L (ref 38–126)
Anion gap: 9 (ref 5–15)
BUN: 13 mg/dL (ref 8–23)
CO2: 26 mmol/L (ref 22–32)
Calcium: 8.4 mg/dL — ABNORMAL LOW (ref 8.9–10.3)
Chloride: 107 mmol/L (ref 98–111)
Creatinine, Ser: 1.09 mg/dL (ref 0.61–1.24)
GFR, Estimated: >60 mL/min (ref >60)
Glucose, Bld: 110 mg/dL — ABNORMAL HIGH (ref 70–99)
Potassium: 4.1 mmol/L (ref 3.5–5.1)
Sodium: 142 mmol/L (ref 135–145)
Total Bilirubin: 0.6 mg/dL (ref 0.0–1.2)
Total Protein: 5.8 g/dL — ABNORMAL LOW (ref 6.5–8.1)

## 2024-02-05 LAB — TROPONIN I (HIGH SENSITIVITY)
Troponin I (High Sensitivity): 74 ng/L — ABNORMAL HIGH (ref <18)
Troponin I (High Sensitivity): 72 ng/L — ABNORMAL HIGH (ref <18)

## 2024-02-05 LAB — PHOSPHORUS: Phosphorus: 3.4 mg/dL (ref 2.5–4.6)

## 2024-02-05 LAB — MAGNESIUM: Magnesium: 2 mg/dL (ref 1.7–2.4)

## 2024-02-05 MED ORDER — PRAVASTATIN SODIUM 40 MG PO TABS: 40.0000 mg | ORAL_TABLET | Freq: Every day | ORAL | Status: DC

## 2024-02-05 MED ORDER — ASPIRIN 81 MG PO CHEW
81.0000 mg | CHEWABLE_TABLET | Freq: Every day | ORAL | Status: DC
  Administered 2024-02-05: 81 mg via ORAL
  Filled 2024-02-05: qty 1

## 2024-02-05 MED ORDER — HEPARIN SODIUM (PORCINE) 5000 UNIT/ML IJ SOLN
5000.0000 [IU] | Freq: Three times a day (TID) | INTRAMUSCULAR | Status: DC
  Administered 2024-02-05: 5000 [IU] via SUBCUTANEOUS
  Filled 2024-02-05 (×2): qty 1

## 2024-02-05 MED ORDER — TAMSULOSIN HCL 0.4 MG PO CAPS
0.8000 mg | ORAL_CAPSULE | Freq: Every day | ORAL | Status: DC
Start: 2024-02-05 — End: 2024-02-05
  Administered 2024-02-05: 0.8 mg via ORAL
  Filled 2024-02-05: qty 2

## 2024-02-05 MED ORDER — METOPROLOL TARTRATE 25 MG PO TABS: 12.5000 mg | ORAL_TABLET | Freq: Two times a day (BID) | ORAL | 1 refills | Status: DC

## 2024-02-05 MED ORDER — PANTOPRAZOLE SODIUM 40 MG PO TBEC
80.0000 mg | DELAYED_RELEASE_TABLET | Freq: Every day | ORAL | Status: DC
  Administered 2024-02-05: 80 mg via ORAL
  Filled 2024-02-05: qty 2

## 2024-02-05 MED ORDER — ACETAMINOPHEN 650 MG RE SUPP: 650.0000 mg | Freq: Four times a day (QID) | RECTAL | Status: DC | PRN

## 2024-02-05 MED ORDER — ALBUTEROL SULFATE (2.5 MG/3ML) 0.083% IN NEBU
2.5000 mg | INHALATION_SOLUTION | RESPIRATORY_TRACT | Status: DC | PRN
  Administered 2024-02-05 (×2): 2.5 mg via RESPIRATORY_TRACT
  Filled 2024-02-05 (×2): qty 3

## 2024-02-05 MED ORDER — FAMOTIDINE 20 MG PO TABS
20.0000 mg | ORAL_TABLET | Freq: Every day | ORAL | Status: DC
Start: 2024-02-05 — End: 2024-02-05
  Administered 2024-02-05: 20 mg via ORAL
  Filled 2024-02-05: qty 1

## 2024-02-05 MED ORDER — ACETAMINOPHEN 325 MG PO TABS: 650.0000 mg | ORAL_TABLET | Freq: Four times a day (QID) | ORAL | Status: DC | PRN

## 2024-02-05 MED ORDER — OXYMETAZOLINE HCL 0.05 % NA SOLN: 1.0000 | Freq: Two times a day (BID) | NASAL | Status: DC | PRN

## 2024-02-05 MED ORDER — METOPROLOL TARTRATE 25 MG PO TABS
12.5000 mg | ORAL_TABLET | Freq: Two times a day (BID) | ORAL | Status: DC
  Administered 2024-02-05: 12.5 mg via ORAL
  Filled 2024-02-05: qty 1

## 2024-02-05 MED ORDER — LATANOPROST 0.005 % OP SOLN: 1.0000 [drp] | Freq: Every day | OPHTHALMIC | Status: DC

## 2024-02-05 MED ORDER — FLUTICASONE FUROATE-VILANTEROL 200-25 MCG/ACT IN AEPB: 1.0000 | INHALATION_SPRAY | Freq: Every day | RESPIRATORY_TRACT | Status: DC

## 2024-02-05 MED ORDER — ONDANSETRON HCL 4 MG/2ML IJ SOLN: 4.0000 mg | Freq: Four times a day (QID) | INTRAMUSCULAR | Status: DC | PRN

## 2024-02-05 MED ORDER — ONDANSETRON HCL 4 MG PO TABS: 4.0000 mg | ORAL_TABLET | Freq: Four times a day (QID) | ORAL | Status: DC | PRN

## 2024-02-05 NOTE — Progress Notes (Signed)
  Echocardiogram 2D Echocardiogram has been performed.  Devora Ellouise SAUNDERS 02/05/2024, 1:14 PM

## 2024-02-05 NOTE — Progress Notes (Addendum)
   02/05/24 1117  TOC Brief Assessment  Insurance and Status Reviewed  Patient has primary care physician Yes  Home environment has been reviewed Home  Prior level of function: Independent  Prior/Current Home Services No current home services  Social Drivers of Health Review SDOH reviewed no interventions necessary  Readmission risk has been reviewed Yes  Transition of care needs no transition of care needs at this time     Discharging home, no needs.  Transition of Care Department [facility]) has reviewed patient and no TOC needs have been identified at this time. We will continue to monitor patient advancement through interdisciplinary progression rounds. If new patient transition needs arise, please place a TOC consult.

## 2024-02-05 NOTE — Discharge Summary (Signed)
 Physician Discharge Summary   Patient: [name] [name] MRN: [id number] DOB: 1941-03-04  Admit date:     02/04/2024  Discharge date: 02/05/24  Discharge Physician: Alm Chavy Avera   PCP: Cook, Jayce G, DO   Recommendations at discharge:   Please follow up with primary care provider within 1-2 weeks  Please repeat BMP and CBC in one week   Hospital Course: 83 year old male with a history of coronary disease, COPD, AAA status post stent graft repair with aortobiiliac endograft 07/29/2023, BPH, hyperlipidemia, hypertension presenting with 1 week history of dyspnea on exertion, generalized weakness that worsened on 02/04/2024.  The patient states that he had been recently prescribed cefuroxime  by his PCP.  He took 3 doses without much improvement. He had denied any fevers, chills, chest pain, hemoptysis, nausea, vomiting, diarrhea, abdominal pain.  He has a chronic cough which she states has not changed.  He has chronic lower extremity edema which he states is about the same. Upon presentation to the emergency department, he was afebrile but noted to have hypotension with blood pressure of 88/74.  EKG showed atrial fibrillation with RVR.  The patient was started on a diltiazem  drip for short period of time after which she converted to sinus rhythm. He was noted to have a lactic acid of 2.7.  He was started on IV fluids.  This did improve to 1.4 BMP showed sodium 140, potassium 4.0, bicarbonate 25, serum creatinine 1.73. Troponin 29>> 47>> 74>> 72;  BNP 49 The patient was admitted for further evaluation and treatment secondary to his lab abnormalities and atrial fibrillation.  Assessment and Plan:  Paroxysmal atrial fibrillation with RVR -He has spontaneously converted back to sinus rhythm -He is willing to try restarting low-dose beta-blocker despite his remote intolerance (pt does not remember it was listed) -He wishes to defer anticoagulation for now until he is able to speak with his outpatient  cardiologist and primary care provider - Continue aspirin  for now - TSH 1.449 - 02/05/24 Echo EF 55-60%, no WMA, G1DD, trivial MR, no AS, low normal RVF - started metoprolol  12.5 mg bid   Lactic acidosis - Likely secondary to hypotension and decreased perfusion - Obtain UA-neg for pyuria - Blood cultures x 2--pt did not want to wait at least 24 hours for results - Chest x-ray without any infiltrates - He is afebrile and hemodynamically stable   AKI - Secondary to volume depletion - Baseline creatinine 0.8-1.0 - Presented with serum creatinine 1/73 - serum creatinine 1.09 on day of d/c   Increased troponin - No chest pain presently - Secondary to demand ischemia - 02/05/24 Echo EF 55-60%, no WMA, G1DD, trivial MR, no AS, low normal RVF     COPD - Stable on room air   Essential hypertension - Holding losartan  in the setting of AKI   AAA - Status post repair 07/29/2023 Dr. Gretta   Coronary artery disease - Status post PCI to RCA 1999 - No chest pain presently - Continue aspirin    BPH with LUTS - Continue tamsulosin  - Follow-up Dr. Sherrilee   Mixed hyperlipidemia - Continue statin          Consultants: none Procedures performed: none  Disposition: Home Diet recommendation:  Cardiac diet DISCHARGE MEDICATION: Allergies as of 02/05/2024       Reactions   Bactrim  [sulfamethoxazole -trimethoprim ] Hives   Beta Adrenergic Blockers Diarrhea   Ciprofloxacin  Nausea And Vomiting, Rash, Other (See Comments)   Body aches   Doxycycline  Swelling   Lips swelling  and skin peeling around mouth   Lasix  [furosemide ] Rash   Dexamethasone  Swelling   Gabapentin  Swelling   Legs and feet swelling   Methocarbamol Swelling, Rash   Myrbetriq  [mirabegron ] Hives, Itching   Neomycin Swelling, Rash   Penicillins Rash, Swelling, Dermatitis   Tetracyclines & Related Itching        Medication List     STOP taking these medications    losartan  25 MG tablet Commonly known as:  COZAAR        TAKE these medications    albuterol  108 (90 Base) MCG/ACT inhaler Commonly known as: VENTOLIN  HFA Inhale 2 puffs into the lungs every 4 (four) hours as needed for wheezing or shortness of breath.   albuterol  (2.5 MG/3ML) 0.083% nebulizer solution Commonly known as: PROVENTIL  Take 3 mLs (2.5 mg total) by nebulization every 6 (six) hours as needed for wheezing or shortness of breath.   aspirin  EC 81 MG tablet Take 1 tablet (81 mg total) by mouth daily at 6 (six) AM. Swallow whole.   Breztri  Aerosphere 160-9-4.8 MCG/ACT Aero inhaler Generic drug: budesonide -glycopyrrolate -formoterol  Inhale 2 puffs into the lungs in the morning and at bedtime.   cefUROXime  500 MG tablet Commonly known as: CEFTIN  Take 1 tablet (500 mg total) by mouth in the morning, at noon, and at bedtime for 14 days.   cetirizine 10 MG tablet Commonly known as: ZYRTEC Take 10 mg by mouth daily.   Combigan  0.2-0.5 % ophthalmic solution Generic drug: brimonidine -timolol  Apply 1 drop to eye 2 (two) times daily.   esomeprazole  40 MG capsule Commonly known as: NexIUM  Take 30-60 min before first meal of the day   famotidine  20 MG tablet Commonly known as: Pepcid  One after supper   fluticasone -salmeterol 230-21 MCG/ACT inhaler Commonly known as: Advair  HFA Take 2 puffs first thing in am and then another 2 puffs about 12 hours later.   latanoprost  0.005 % ophthalmic solution Commonly known as: XALATAN  Place 1 drop into both eyes at bedtime.   LORazepam  0.5 MG tablet Commonly known as: ATIVAN  Take 1 tablet (0.5 mg total) by mouth at bedtime as needed.   lovastatin  40 MG tablet Commonly known as: MEVACOR  Take 1 tablet (40 mg total) by mouth daily.   metoprolol  tartrate 25 MG tablet Commonly known as: LOPRESSOR  Take 0.5 tablets (12.5 mg total) by mouth 2 (two) times daily.   metroNIDAZOLE  0.75 % gel Commonly known as: METROGEL  Apply 1 Application topically daily as needed (rosacea).    oxymetazoline  0.05 % nasal spray Commonly known as: AFRIN Place 1 spray into both nostrils 2 (two) times daily as needed for congestion.   Spacer/Aero-Holding [employer] Use as directed   tamsulosin  0.4 MG Caps capsule Commonly known as: FLOMAX  Take 2 capsules (0.8 mg total) by mouth daily.   triamcinolone  cream 0.1 % Commonly known as: KENALOG  Apply 1 Application topically 2 (two) times daily as needed (itching).        Discharge Exam: Filed Weights   02/04/24 1848  Weight: 94.8 kg   HEENT:  [place]/AT, No thrush, no icterus CV:  RRR, no rub, no S3, no S4 Lung:  diminished BS, but CTA, no wheeze, no rhonchi Abd:  soft/+BS, NT Ext:  trace LE edema, no lymphangitis, no synovitis, no rash   Condition at discharge: stable  The results of significant diagnostics from this hospitalization (including imaging, microbiology, ancillary and laboratory) are listed below for reference.   Imaging Studies: ECHOCARDIOGRAM COMPLETE Result Date: 02/05/2024    ECHOCARDIOGRAM REPORT  Patient Name:   TECUMSEH YEAGLEY Date of Exam: 02/05/2024 Medical Rec #:  [id number]     Height:       73.0 in Accession #:    [id number]    Weight:       209.0 lb Date of Birth:  November 18, 1940     BSA:          2.192 m Patient Age:    83 years      BP:           119/59 mmHg Patient Gender: M             HR:           81 bpm. Exam Location:  [facility] Procedure: 2D Echo, Cardiac Doppler and Color Doppler (Both Spectral and Color            Flow Doppler were utilized during procedure). Indications:    I48.91* Unspeicified atrial fibrillation  History:        Patient has no prior history of Echocardiogram examinations.                 CAD, COPD, Signs/Symptoms:Shortness of Breath and Dyspnea; Risk                 Factors:Dyslipidemia and Hypertension. Descending aorta stent.                 AAA.  Sonographer:    Ellouise Mose RDCS Referring Phys: [phone] Agapito Hanway  Sonographer Comments: Technically difficult study due to poor echo  windows. Low parasternal. RN interrupted during pedoff to give reflux meds. IMPRESSIONS  1. Left ventricular ejection fraction, by estimation, is 55 to 60%. Left ventricular ejection fraction by 2D MOD biplane is 55.7 %. The left ventricle has normal function. The left ventricle has no regional wall motion abnormalities. Left ventricular diastolic parameters are consistent with Grade I diastolic dysfunction (impaired relaxation).  2. Right ventricular systolic function is low normal. The right ventricular size is normal. Tricuspid regurgitation signal is inadequate for assessing PA pressure.  3. Right atrial size was moderately dilated.  4. The mitral valve is degenerative. Trivial mitral valve regurgitation. No evidence of mitral stenosis.  5. Very difficult to visulalize aortic valve leaflets. . The aortic valve is calcified. Aortic valve regurgitation is trivial. Aortic valve sclerosis is present, with no evidence of aortic valve stenosis.  6. Aortic dilatation noted. There is mild dilatation of the aortic root, measuring 40 mm.  7. The inferior vena cava is normal in size with greater than 50% respiratory variability, suggesting right atrial pressure of 3 mmHg. Comparison(s): No prior Echocardiogram. FINDINGS  Left Ventricle: Left ventricular ejection fraction, by estimation, is 55 to 60%. Left ventricular ejection fraction by 2D MOD biplane is 55.7 %. The left ventricle has normal function. The left ventricle has no regional wall motion abnormalities. The left ventricular internal cavity size was normal in size. There is no left ventricular hypertrophy. Left ventricular diastolic parameters are consistent with Grade I diastolic dysfunction (impaired relaxation). Normal left ventricular filling pressure. Right Ventricle: The right ventricular size is normal. No increase in right ventricular wall thickness. Right ventricular systolic function is low normal. Tricuspid regurgitation signal is inadequate for  assessing PA pressure. Left Atrium: Left atrial size was normal in size. Right Atrium: Right atrial size was moderately dilated. Pericardium: There is no evidence of pericardial effusion. Mitral Valve: The mitral valve is degenerative in appearance. Trivial mitral valve regurgitation. No evidence of  mitral valve stenosis. Tricuspid Valve: The tricuspid valve is normal in structure. Tricuspid valve regurgitation is trivial. No evidence of tricuspid stenosis. Aortic Valve: Very difficult to visulalize aortic valve leaflets. The aortic valve is calcified. Aortic valve regurgitation is trivial. Aortic valve sclerosis is present, with no evidence of aortic valve stenosis. Aortic valve mean gradient measures 5.2 mmHg. Aortic valve peak gradient measures 9.0 mmHg. Aortic valve area, by VTI measures 2.76 cm. Pulmonic Valve: The pulmonic valve was normal in structure. Pulmonic valve regurgitation is not visualized. No evidence of pulmonic stenosis. Aorta: The aortic root and ascending aorta are structurally normal, with no evidence of dilitation and aortic dilatation noted. There is mild dilatation of the aortic root, measuring 40 mm. Venous: The inferior vena cava is normal in size with greater than 50% respiratory variability, suggesting right atrial pressure of 3 mmHg. IAS/Shunts: No atrial level shunt detected by color flow Doppler.  LEFT VENTRICLE PLAX 2D                        Biplane EF (MOD) LVIDd:         5.60 cm         LV Biplane EF:   Left LVIDs:         4.10 cm                          ventricular LV PW:         1.10 cm                          ejection LV IVS:        1.00 cm                          fraction by LVOT diam:     2.50 cm                          2D MOD LV SV:         81                               biplane is LV SV Index:   37                               55.7 %. LVOT Area:     4.91 cm                                Diastology                                LV e' medial:    9.36 cm/s LV Volumes  (MOD)               LV E/e' medial:  6.8 LV vol d, MOD    113.0 ml      LV e' lateral:   9.90 cm/s A2C:                           LV E/e' lateral: 6.5 LV vol d, MOD  81.1 ml A4C: LV vol s, MOD    46.0 ml A2C: LV vol s, MOD    37.1 ml A4C: LV SV MOD A2C:   67.0 ml LV SV MOD A4C:   81.1 ml LV SV MOD BP:    54.5 ml RIGHT VENTRICLE             IVC RV S prime:     10.90 cm/s  IVC diam: 2.10 cm TAPSE (M-mode): 2.2 cm LEFT ATRIUM             Index        RIGHT ATRIUM           Index LA diam:        3.80 cm 1.73 cm/m   RA Area:     21.50 cm LA Vol (A2C):   48.3 ml 22.03 ml/m  RA Volume:   68.00 ml  31.02 ml/m LA Vol (A4C):   26.1 ml 11.91 ml/m LA Biplane Vol: 36.1 ml 16.47 ml/m  AORTIC VALVE AV Area (Vmax):    2.82 cm AV Area (Vmean):   2.73 cm AV Area (VTI):     2.76 cm AV Vmax:           150.00 cm/s AV Vmean:          103.050 cm/s AV VTI:            0.294 m AV Peak Grad:      9.0 mmHg AV Mean Grad:      5.2 mmHg LVOT Vmax:         86.20 cm/s LVOT Vmean:        57.250 cm/s LVOT VTI:          0.165 m LVOT/AV VTI ratio: 0.56  AORTA Ao Root diam: 4.00 cm Ao Asc diam:  3.60 cm MITRAL VALVE MV Area (PHT): 3.22 cm    SHUNTS MV Decel Time: 236 msec    Systemic VTI:  0.17 m MV E velocity: 64.10 cm/s  Systemic Diam: 2.50 cm MV A velocity: 69.60 cm/s MV E/A ratio:  0.92 Vinie Maxcy MD Electronically signed by Vinie Maxcy MD Signature Date/Time: 02/05/2024/1:17:12 PM    Final    DG Chest 2 View Result Date: 02/04/2024 CLINICAL DATA:  Shortness of breath EXAM: CHEST - 2 VIEW COMPARISON:  Chest x-ray 07/29/2023 FINDINGS: The heart size and mediastinal contours are within normal limits. Both lungs are clear. Stable left diaphragmatic hernia. The visualized skeletal structures are unremarkable. IMPRESSION: No active cardiopulmonary disease. Electronically Signed   By: Greig Pique M.D.   On: 02/04/2024 19:18    Microbiology: Results for orders placed or performed during the hospital encounter of 02/04/24  Culture,  blood (Routine X 2) w Reflex to ID Panel     Status: None (Preliminary result)   Collection Time: 02/05/24  9:23 AM   Specimen: Left Antecubital; Blood  Result Value Ref Range Status   Specimen Description   Final    LEFT ANTECUBITAL BOTTLES DRAWN AEROBIC AND ANAEROBIC   Special Requests   Final    Blood Culture adequate volume Performed at [facility], [address], [place], [place] [address]    Culture PENDING  Incomplete   Report Status PENDING  Incomplete  Culture, blood (Routine X 2) w Reflex to ID Panel     Status: None (Preliminary result)   Collection Time: 02/05/24  9:23 AM   Specimen: BLOOD LEFT HAND  Result Value Ref Range Status  Specimen Description   Final    BLOOD LEFT HAND BOTTLES DRAWN AEROBIC AND ANAEROBIC   Special Requests   Final    Blood Culture adequate volume Performed at [facility], [address] [place], [place] [address]    Culture PENDING  Incomplete   Report Status PENDING  Incomplete   *Note: Due to a large number of results and/or encounters for the requested time period, some results have not been displayed. A complete set of results can be found in Results Review.    Labs: CBC: Recent Labs  Lab 02/04/24 1918 02/05/24 0309  WBC 10.3 8.4  HGB 14.1 12.3*  HCT 43.5 38.6*  MCV 91.4 92.3  PLT 237 203   Basic Metabolic Panel: Recent Labs  Lab 02/04/24 1918 02/05/24 0309  NA 140 142  K 4.0 4.1  CL 103 107  CO2 25 26  GLUCOSE 125* 110*  BUN 12 13  CREATININE 1.73* 1.09  CALCIUM  9.1 8.4*  MG 2.0 2.0  PHOS  --  3.4   Liver Function Tests: Recent Labs  Lab 02/05/24 0309  AST 24  ALT 20  ALKPHOS 50  BILITOT 0.6  PROT 5.8*  ALBUMIN  2.9*   CBG: No results for input(s): GLUCAP in the last 168 hours.  Discharge time spent: greater than 30 minutes.  Signed: Alm Schneider, MD [facility] 02/05/2024

## 2024-02-05 NOTE — Plan of Care (Signed)
  Problem: Clinical Measurements: Goal: Ability to maintain clinical measurements within normal limits will improve Outcome: Progressing Goal: Will remain free from infection Outcome: Progressing Goal: Diagnostic test results will improve Outcome: Progressing Goal: Respiratory complications will improve Outcome: Progressing Goal: Cardiovascular complication will be avoided Outcome: Progressing   Problem: Coping: Goal: Level of anxiety will decrease Outcome: Progressing   Problem: Elimination: Goal: Will not experience complications related to bowel motility Outcome: Progressing Goal: Will not experience complications related to urinary retention Outcome: Progressing   Problem: Pain Managment: Goal: General experience of comfort will improve and/or be controlled Outcome: Progressing   Problem: Safety: Goal: Ability to remain free from injury will improve Outcome: Progressing   Problem: Skin Integrity: Goal: Risk for impaired skin integrity will decrease Outcome: Progressing

## 2024-02-05 NOTE — Hospital Course (Addendum)
 83 year old male with a history of coronary disease, COPD, AAA status post stent graft repair with aortobiiliac endograft 07/29/2023, BPH, hyperlipidemia, hypertension presenting with 1 week history of dyspnea on exertion, generalized weakness that worsened on 02/04/2024.  The patient states that he had been recently prescribed cefuroxime  by his PCP.  He took 3 doses without much improvement. He had denied any fevers, chills, chest pain, hemoptysis, nausea, vomiting, diarrhea, abdominal pain.  He has a chronic cough which she states has not changed.  He has chronic lower extremity edema which he states is about the same. Upon presentation to the emergency department, he was afebrile but noted to have hypotension with blood pressure of 88/74.  EKG showed atrial fibrillation with RVR.  The patient was started on a diltiazem  drip for short period of time after which she converted to sinus rhythm. He was noted to have a lactic acid of 2.7.  He was started on IV fluids.  This did improve to 1.4 BMP showed sodium 140, potassium 4.0, bicarbonate 25, serum creatinine 1.73. Troponin 29>> 47>> 74>> 72;  BNP 49 The patient was admitted for further evaluation and treatment secondary to his lab abnormalities and atrial fibrillation.

## 2024-02-05 NOTE — Progress Notes (Signed)
 PROGRESS NOTE  [name] [name] FMW:[id number] DOB: 02-15-1941 DOA: 02/04/2024 PCP: Cook, Jayce G, DO  Brief History:  83 year old male with a history of coronary disease, COPD, AAA status post stent graft repair with aortobiiliac endograft 07/29/2023, BPH, hyperlipidemia, hypertension presenting with 1 week history of dyspnea on exertion, generalized weakness that worsened on 02/04/2024.  The patient states that he had been recently prescribed cefuroxime  by his PCP.  He took 3 doses without much improvement. He had denied any fevers, chills, chest pain, hemoptysis, nausea, vomiting, diarrhea, abdominal pain.  He has a chronic cough which she states has not changed.  He has chronic lower extremity edema which he states is about the same. Upon presentation to the emergency department, he was afebrile but noted to have hypotension with blood pressure of 88/74.  EKG showed atrial fibrillation with RVR.  The patient was started on a diltiazem  drip for short period of time after which she converted to sinus rhythm. He was noted to have a lactic acid of 2.7.  He was started on IV fluids.  This did improve to 1.4 BMP showed sodium 140, potassium 4.0, bicarbonate 25, serum creatinine 1.73. Troponin 29>> 47>> 74>> 72 The patient was admitted for further evaluation and treatment secondary to his lab abnormalities and atrial fibrillation.   Assessment/Plan: Paroxysmal atrial fibrillation with RVR -He has spontaneously converted back to sinus rhythm -He is willing to try restarting low-dose beta-blocker despite his remote intolerance -He wishes to defer anticoagulation for now until he is able to speak with his cardiologist and primary care provider - Continue aspirin  for now - TSH 1.449 - Echo  Lactic acidosis - Likely secondary to hypotension and decreased perfusion - Obtain UA - Blood cultures x 2 - Chest x-ray without any infiltrates - He is afebrile  AKI - Secondary to volume  depletion - Baseline creatinine 0.8-1.0 - Presented with serum creatinine 0.8-1.0  Increased troponin - No chest pain presently - Secondary to demand ischemia - Echocardiogram  COPD - Stable on room air  Essential hypertension - Holding losartan  in the setting of AKI  AAA - Status post repair 07/29/2023 Dr. Gretta  Coronary artery disease - Status post PCI to RCA 1999 - No chest pain presently - Continue aspirin   BPH with LUTS - Continue tamsulosin  - Follow-up Dr. Sherrilee  Mixed hyperlipidemia - Continue statin     Family Communication: no  Family at bedside  Consultants:  none  Code Status:  FULL   DVT Prophylaxis:  [place] Heparin     Procedures: As Listed in Progress Note Above  Antibiotics: None      Subjective: Patient denies fevers, chills, headache, chest pain, dyspnea, nausea, vomiting, diarrhea, abdominal pain, dysuria, hematuria, hematochezia, and melena.   Objective: Vitals:   02/04/24 2348 02/05/24 0009 02/05/24 0354 02/05/24 0600  BP: (!) 164/98   (!) 119/59  Pulse: 90   79  Resp: 20   20  Temp: (!) 97.4 F (36.3 C)   97.7 F (36.5 C)  TempSrc: Oral   Oral  SpO2: 95% 95% 99% 97%  Weight:      Height:        Intake/Output Summary (Last 24 hours) at 02/05/2024 0852 Last data filed at 02/05/2024 0654 Gross per 24 hour  Intake 2610.73 ml  Output 300 ml  Net 2310.73 ml   Weight change:  Exam:  General:  Pt is alert, follows commands appropriately, not in acute  distress HEENT: No icterus, No thrush, No neck mass, [place]/AT Cardiovascular: RRR, S1/S2, no rubs, no gallops Respiratory: Diminished breath sounds.  No wheezing. Abdomen: Soft/+BS, non tender, non distended, no guarding Extremities: trace LE edema, No lymphangitis, No petechiae, No rashes, no synovitis   Data Reviewed: I have personally reviewed following labs and imaging studies Basic Metabolic Panel: Recent Labs  Lab 02/04/24 1918 02/05/24 0309  NA 140 142  K 4.0  4.1  CL 103 107  CO2 25 26  GLUCOSE 125* 110*  BUN 12 13  CREATININE 1.73* 1.09  CALCIUM  9.1 8.4*  MG 2.0 2.0  PHOS  --  3.4   Liver Function Tests: Recent Labs  Lab 02/05/24 0309  AST 24  ALT 20  ALKPHOS 50  BILITOT 0.6  PROT 5.8*  ALBUMIN  2.9*   No results for input(s): LIPASE, AMYLASE in the last 168 hours. No results for input(s): AMMONIA in the last 168 hours. Coagulation Profile: Recent Labs  Lab 02/04/24 1918  INR 1.0   CBC: Recent Labs  Lab 02/04/24 1918 02/05/24 0309  WBC 10.3 8.4  HGB 14.1 12.3*  HCT 43.5 38.6*  MCV 91.4 92.3  PLT 237 203   Cardiac Enzymes: No results for input(s): CKTOTAL, CKMB, CKMBINDEX, TROPONINI in the last 168 hours. BNP: Invalid input(s): POCBNP CBG: No results for input(s): GLUCAP in the last 168 hours. HbA1C: No results for input(s): HGBA1C in the last 72 hours. Urine analysis:    Component Value Date/Time   COLORURINE YELLOW 07/22/2023 1443   APPEARANCEUR Clear 08/05/2023 1341   LABSPEC 1.013 07/22/2023 1443   PHURINE 7.0 07/22/2023 1443   GLUCOSEU Negative 08/05/2023 1341   HGBUR NEGATIVE 07/22/2023 1443   BILIRUBINUR Negative 08/05/2023 1341   KETONESUR NEGATIVE 07/22/2023 1443   PROTEINUR Negative 08/05/2023 1341   PROTEINUR NEGATIVE 07/22/2023 1443   UROBILINOGEN 0.2 03/29/2023 1512   UROBILINOGEN 0.2 12/04/2014 0240   NITRITE Negative 08/05/2023 1341   NITRITE NEGATIVE 07/22/2023 1443   LEUKOCYTESUR Negative 08/05/2023 1341   LEUKOCYTESUR NEGATIVE 07/22/2023 1443   Sepsis Labs: @LABRCNTIP (procalcitonin:4,lacticidven:4) )No results found for this or any previous visit (from the past 240 hours).   Scheduled Meds:  famotidine   20 mg Oral QHS   heparin   5,000 Units Subcutaneous Q8H   latanoprost   1 drop Both Eyes QHS   pantoprazole   80 mg Oral Q1200   pravastatin   40 mg Oral q1800   tamsulosin   0.8 mg Oral Daily   Continuous Infusions:  diltiazem  (CARDIZEM ) infusion Stopped  (02/04/24 2056)   lactated ringers  100 mL/hr at 02/05/24 0056    Procedures/Studies: DG Chest 2 View Result Date: 02/04/2024 CLINICAL DATA:  Shortness of breath EXAM: CHEST - 2 VIEW COMPARISON:  Chest x-ray 07/29/2023 FINDINGS: The heart size and mediastinal contours are within normal limits. Both lungs are clear. Stable left diaphragmatic hernia. The visualized skeletal structures are unremarkable. IMPRESSION: No active cardiopulmonary disease. Electronically Signed   By: Greig Pique M.D.   On: 02/04/2024 19:18    Alm Schneider, DO  [facility]  If 7PM-7AM, please contact night-coverage [email or web address] Password [email or web address] 02/05/2024, 8:52 AM   LOS: 0 days

## 2024-02-06 ENCOUNTER — Telehealth: Payer: Self-pay | Admitting: Cardiovascular Disease

## 2024-02-06 ENCOUNTER — Telehealth: Payer: Self-pay | Admitting: Urology

## 2024-02-06 DIAGNOSIS — I480 Paroxysmal atrial fibrillation: Secondary | ICD-10-CM

## 2024-02-06 NOTE — Telephone Encounter (Signed)
 Pt requesting a c/b to go over results from his ED visit.

## 2024-02-06 NOTE — Telephone Encounter (Signed)
 Return call to patient and made him aware to follow up with PCP as indicated in hospital discharge. Pt is also made aware to follow up with Nephrology for kidney damage. Pt state's he want Dr. Sherrilee involved and to review hospital labs.  Return call to patient Per Dr. Sherrilee pt's creatinine and kidney function are all normal.  Verbalized understanding

## 2024-02-06 NOTE — Telephone Encounter (Signed)
 Patient called into the office today with general questions/concerns regarding hospital stay. He wants McKenzie to look at hospital labs. States he has kidney issue. Patient may be reached at [phone] to discuss questions.

## 2024-02-07 ENCOUNTER — Ambulatory Visit (HOSPITAL_COMMUNITY)
Admission: RE | Admit: 2024-02-07 | Discharge: 2024-02-07 | Disposition: A | Discharge status: Home / Self Care | Source: Ambulatory Visit | Attending: Nephrology | Admitting: Nephrology

## 2024-02-07 ENCOUNTER — Ambulatory Visit: Admitting: Family Medicine

## 2024-02-07 ENCOUNTER — Encounter: Payer: Self-pay | Admitting: Family Medicine

## 2024-02-07 VITALS — BP 112/64 | HR 89 | Ht 73.0 in | Wt 209.0 lb

## 2024-02-07 DIAGNOSIS — N401 Enlarged prostate with lower urinary tract symptoms: Secondary | ICD-10-CM

## 2024-02-07 DIAGNOSIS — N182 Chronic kidney disease, stage 2 (mild): Secondary | ICD-10-CM | POA: Diagnosis not present

## 2024-02-07 DIAGNOSIS — R351 Nocturia: Secondary | ICD-10-CM | POA: Diagnosis not present

## 2024-02-07 DIAGNOSIS — I480 Paroxysmal atrial fibrillation: Secondary | ICD-10-CM

## 2024-02-07 DIAGNOSIS — N281 Cyst of kidney, acquired: Secondary | ICD-10-CM | POA: Diagnosis not present

## 2024-02-07 DIAGNOSIS — N189 Chronic kidney disease, unspecified: Secondary | ICD-10-CM | POA: Diagnosis not present

## 2024-02-07 NOTE — Patient Instructions (Addendum)
 Referral placed.  Renal function back to normal.  Follow up in 1 month.  Take care  Dr. Bluford

## 2024-02-07 NOTE — Telephone Encounter (Signed)
Please see telephone encounter from 9/22

## 2024-02-08 DIAGNOSIS — R040 Epistaxis: Secondary | ICD-10-CM | POA: Diagnosis not present

## 2024-02-08 NOTE — Progress Notes (Signed)
 History of Present Illness The patient is an 83 year old male who presents for evaluation of nosebleeds.  He was recently diagnosed with atrial fibrillation, which led to his hospitalization last weekend. During his hospital stay, the possibility of starting anticoagulant therapy was discussed but concern was raised due to his history of nosebleeds. He experienced a nosebleed last Friday at 2 AM and a lighter one the previous Wednesday at the same time. These episodes have occurred without blood thinners.   Nosebleeds have been predominantly right-sided, but this past week, he had one on the left side and another on the right. The bleeding was light and easily controlled. He has been dealing with nosebleeds for the past 9 years, which have been managed previously by Dr. Karis with multiple cauterizations. In 2021, he underwent a procedure under sedation that addressed both sides.  He has not bled as badly since then.   Physical Exam Nose: Anterior exam unremarkable.   Nasal/Sinus Endoscopy  Date/Time: 02/08/2024 10:45 AM  Performed by: Vaughan Alm Ricker, MD Authorized by: Vaughan Alm Ricker, MD  Local anesthesia used: no  Anesthesia: Local anesthesia used: no  Sedation: Patient sedated: no  Patient tolerance: patient tolerated the procedure well with no immediate complications Comments: Afrin with lidocaine  sprayed in both sides.  Fiberoptic laryngoscope used to evaluate both nasal passages.  A prominent red bump was visualized on the right lower mid-septum.  On the left, there is a prominent, pulsatile area in the region of the sphenopalatine artery.    Assessment & Plan 1. Epistaxis. Two potential sources of bleeding were identified on endoscopy.  Before starting anti-coagulation therapy, I recommended proceeding with nasal endoscopy with cautery of the right-sided site and clipping of the left sphenopalatine site.  Risks, benefits, and alternatives were discussed and he expressed  understanding and agreement.  Results

## 2024-02-08 NOTE — Assessment & Plan Note (Signed)
 Remains in sinus rhythm. Continue metoprolol . Holding off on anticoagulation at this time. Patient to reach out to ENT for an appt. Has cardiology follow up.

## 2024-02-08 NOTE — Progress Notes (Signed)
 Subjective:  Patient ID: [name] [name], male    DOB: 07-31-1940  Age: 83 y.o. MRN: [id number]  CC:   Chief Complaint  Patient presents with   Hospitalization Follow-up    HPI:  83 year old male with the below mentioned medical problems presents for hospital follow-up.  Patient presented with dyspnea on exertion and generalized weakness.  In the ER was hypertensive and was in A-fib with RVR.  He was subsequently started on diltiazem  and converted to sinus rhythm quickly.  Additionally, was found to have lactic acidosis, elevated troponin, and AKI.  AKI improved with IV fluids.  Lactic acidosis improved as well.  Patient discharged home on metoprolol .  Patient presents today for follow-up.  He was not placed on anticoagulation.  This was deferred to myself and cardiology.  Patient concerned about going on anticoagulation for A-fib given recurrent nosebleeds.  He has a relationship with ear nose and throat.  Advised him to reach out to ear nose and throat.    He states that overall he is doing okay.  Patient would like to see a different urologist.  He is requesting a referral.  Patient Active Problem List   Diagnosis Date Noted   Paroxysmal atrial fibrillation with RVR (HCC) 02/05/2024   Abdominal pain, chronic, right lower quadrant 02/02/2024   Lower urinary tract symptoms (LUTS) 01/22/2024   Lower extremity edema 12/15/2023   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   DOE (dyspnea on exertion) 10/20/2023   Cervical radiculopathy 08/15/2023   BPH associated with nocturia 05/24/2023   COPD GOLD 3/ AB 05/05/2023   Celiac artery stenosis 02/02/2023   Chronic idiopathic constipation 12/21/2022   Bilateral primary osteoarthritis of knee 04/14/2022   Irritable bowel syndrome 03/17/2022   Seasonal allergies 08/06/2021   BPH (benign prostatic hyperplasia) 04/23/2021   CAD (coronary artery disease) 04/23/2021   Epistaxis 04/23/2021   Essential hypertension 09/16/2016    Lumbar spondylosis 11/05/2014   GERD (gastroesophageal reflux disease) 01/19/2012   Mixed hyperlipidemia 04/15/2008   AAA (abdominal aortic aneurysm) 04/15/2008    Social Hx   Social History   Socioeconomic History   Marital status: Widowed    Spouse name: Not on file   Number of children: Not on file   Years of education: Not on file   Highest education level: Not on file  Occupational History   Not on file  Tobacco Use   Smoking status: Former    Current packs/day: 0.00    Average packs/day: 1 pack/day for 35.0 years (35.0 ttl pk-yrs)    Types: Cigarettes    Start date: 03/27/1968    Quit date: 03/28/2003    Years since quitting: 20.8   Smokeless tobacco: Never  Vaping Use   Vaping status: Never Used  Substance and Sexual Activity   Alcohol use: No    Alcohol/week: 0.0 standard drinks of alcohol   Drug use: No   Sexual activity: Not Currently    Birth control/protection: None  Other Topics Concern   Not on file  Social History Narrative   Not on file   Social Drivers of Health   Financial Resource Strain: Low Risk  (03/18/2022)   Overall Financial Resource Strain (CARDIA)    Difficulty of Paying Living Expenses: Not very hard  Food Insecurity: No Food Insecurity (02/05/2024)   Hunger Vital Sign    Worried About Running Out of Food in the Last Year: Never true    Ran Out of Food in the Last Year: Never true  Transportation Needs: No Transportation Needs (02/05/2024)   PRAPARE - Administrator, Civil Service (Medical): No    Lack of Transportation (Non-Medical): No  Physical Activity: Insufficiently Active (03/18/2022)   Exercise Vital Sign    Days of Exercise per Week: 2 days    Minutes of Exercise per Session: 20 min  Stress: No Stress Concern Present (03/18/2022)   [employer] of Occupational Health - Occupational Stress Questionnaire    Feeling of Stress : Not at all  Social Connections: Socially Isolated (02/05/2024)   Social Connection and  Isolation Panel    Frequency of Communication with Friends and Family: More than three times a week    Frequency of Social Gatherings with Friends and Family: Three times a week    Attends Religious Services: Never    Active Member of Clubs or Organizations: No    Attends Banker Meetings: Never    Marital Status: Widowed    Review of Systems Per HPI  Objective:  BP 112/64   Pulse 89   Ht 6' 1 (1.854 m)   Wt 209 lb (94.8 kg)   SpO2 97%   BMI 27.57 kg/m      02/07/2024    3:32 PM 02/05/2024    1:58 PM 02/05/2024    6:00 AM  BP/Weight  Systolic BP 112 146 119  Diastolic BP 64 85 59  Wt. (Lbs) 209    BMI 27.57 kg/m2      Physical Exam Vitals and nursing note reviewed.  Constitutional:      General: He is not in acute distress.    Appearance: Normal appearance.  HENT:     Head: Normocephalic and atraumatic.  Eyes:     General:        Right eye: No discharge.        Left eye: No discharge.     Conjunctiva/sclera: Conjunctivae normal.  Cardiovascular:     Rate and Rhythm: Normal rate and regular rhythm.  Pulmonary:     Effort: Pulmonary effort is normal. No respiratory distress.  Neurological:     Mental Status: He is alert.  Psychiatric:        Mood and Affect: Mood normal.        Behavior: Behavior normal.     Lab Results  Component Value Date   WBC 8.4 02/05/2024   HGB 12.3 (L) 02/05/2024   HCT 38.6 (L) 02/05/2024   PLT 203 02/05/2024   GLUCOSE 110 (H) 02/05/2024   CHOL 134 08/30/2023   TRIG 115 08/30/2023   HDL 45 08/30/2023   LDLCALC 68 08/30/2023   ALT 20 02/05/2024   AST 24 02/05/2024   NA 142 02/05/2024   K 4.1 02/05/2024   CL 107 02/05/2024   CREATININE 1.09 02/05/2024   BUN 13 02/05/2024   CO2 26 02/05/2024   TSH 1.449 02/04/2024   PSA 1.80 07/26/2014   INR 1.0 02/04/2024   HGBA1C 6.0 (H) 08/30/2023     Assessment & Plan:  Paroxysmal atrial fibrillation with RVR (HCC) Assessment & Plan: Remains in sinus rhythm.  Continue metoprolol . Holding off on anticoagulation at this time. Patient to reach out to ENT for an appt. Has cardiology follow up.   Benign prostatic hyperplasia with nocturia -     Ambulatory referral to Urology    Follow-up:  1 month  Santasia Rew Bluford DO [facility]

## 2024-02-09 ENCOUNTER — Other Ambulatory Visit: Payer: Self-pay | Admitting: Otolaryngology

## 2024-02-10 LAB — CULTURE, BLOOD (ROUTINE X 2)
Culture: NO GROWTH
Culture: NO GROWTH
Special Requests: ADEQUATE
Special Requests: ADEQUATE

## 2024-02-13 NOTE — Telephone Encounter (Signed)
 Left a message for patient to call office regarding testing results.

## 2024-02-14 ENCOUNTER — Other Ambulatory Visit: Payer: Self-pay

## 2024-02-14 ENCOUNTER — Encounter (HOSPITAL_COMMUNITY): Payer: Self-pay | Admitting: Otolaryngology

## 2024-02-14 NOTE — Progress Notes (Signed)
 Anesthesia Chart Review: Same day workup  83 year old male with pertinent history including former smoker (35 pack years, quit 2004) with associated COPD Gold 3, asthma, GERD on PPI and H2 blocker, HTN, CAD s/p inferior MI 1999 with PCI to RCA), AAA s/p EVAR 07/29/2023 (stable with no endoleak by CTA 08/29/23).  Nuclear stress test 05/24/2022 was low risk.  Last seen in pulmonology follow-up by Dr. Milagros on 01/11/2024 for history of severe COPD.  Discussed at that time that he was not having good symptom control on Trelegy; had been doing better on Breztri  but was not covered by insurance.  Patient had also just been seen by PCP Dr. Bluford on 01/10/2024 for COPD exacerbation, treated with azithromycin  and prednisone .  Fortunately, patient was subsequently able to get Breztri  approved by insurance  Recent admission 9/20 - 02/05/2024 after presenting with generalized weakness.  He was noted to be in A-fib with RVR and converted to sinus rhythm with diltiazem .  Echo showed LVEF 55 to 60%, normal wall motion, grade 1 DD, low normal RVEF, no significant valvular abnormalities.  Cardiology was consulted and he was recommended to start Eliquis 5 mg twice daily, however, he was noted to have history of recurrent epistaxis.  Final decision was deferred to outpatient management.  He was started on metoprolol  12.5 mg twice daily.  He had outpatient follow-up with PCP Dr. Jacqulyn Bluford on 02/07/2024 and was recommended to follow-up with ENT for management of recurrent epistaxis prior to starting anticoagulation.  Dr. Carlie recommended nasal endoscopy with cautery of right side site and clipping of the left sphenopalatine site prior to starting anticoagulation therapy.  CMP and CBC 02/05/2024 reviewed, hypoalbuminemia with albumin  2.9, mild anemia with hemoglobin 12.3, otherwise unremarkable.  EKG 02/04/2024: Sinus rhythm.  Rate 96. Atrial premature complexes in couplets. Probable anteroseptal infarct, old  TTE 02/05/2024: 1.  Left ventricular ejection fraction, by estimation, is 55 to 60%. Left  ventricular ejection fraction by 2D MOD biplane is 55.7 %. The left  ventricle has normal function. The left ventricle has no regional wall  motion abnormalities. Left ventricular  diastolic parameters are consistent with Grade I diastolic dysfunction  (impaired relaxation).   2. Right ventricular systolic function is low normal. The right  ventricular size is normal. Tricuspid regurgitation signal is inadequate  for assessing PA pressure.   3. Right atrial size was moderately dilated.   4. The mitral valve is degenerative. Trivial mitral valve regurgitation.  No evidence of mitral stenosis.   5. Very difficult to visulalize aortic valve leaflets. . The aortic valve  is calcified. Aortic valve regurgitation is trivial. Aortic valve  sclerosis is present, with no evidence of aortic valve stenosis.   6. Aortic dilatation noted. There is mild dilatation of the aortic root,  measuring 40 mm.   7. The inferior vena cava is normal in size with greater than 50%  respiratory variability, suggesting right atrial pressure of 3 mmHg.   Nuclear stress 05/24/2022:   No ST deviation was noted during pharmacological stress.   There is a fixed medium defect with mild reduction in uptake in the inferior wall with abnormal regional wall function consistent with prior infarction with mild peri-infarct ischemia. The study is low risk.   Left ventricular function is mildly reduced at 45%. Consider Echocardiogram for accurate estimation of left ventricular systolic function.      Lynwood Geofm RIGGERS [facility]/Anesthesiology Phone [phone] 02/14/2024 10:35 AM

## 2024-02-14 NOTE — Anesthesia Preprocedure Evaluation (Addendum)
 Anesthesia Evaluation  Patient identified by MRN, date of birth, ID band Patient awake    Reviewed: Allergy & Precautions, NPO status , Patient's Chart, lab work & pertinent test results, reviewed documented beta blocker date and time   Airway Mallampati: II  TM Distance: >3 FB Neck ROM: Full    Dental  (+) Teeth Intact, Dental Advisory Given, Caps,    Pulmonary asthma , COPD,  COPD inhaler, former smoker   Pulmonary exam normal breath sounds clear to auscultation       Cardiovascular hypertension, Pt. on home beta blockers (-) angina + CAD, + Past MI and + Peripheral Vascular Disease (AAA s/p ABDOMINAL AORTIC ENDOVASCULAR STENT GRAFT)  Normal cardiovascular exam+ dysrhythmias Atrial Fibrillation  Rhythm:Regular Rate:Normal  Echo 02/05/24:  1. Left ventricular ejection fraction, by estimation, is 55 to 60%. Left  ventricular ejection fraction by 2D MOD biplane is 55.7 %. The left  ventricle has normal function. The left ventricle has no regional wall  motion abnormalities. Left ventricular  diastolic parameters are consistent with Grade I diastolic dysfunction  (impaired relaxation).   2. Right ventricular systolic function is low normal. The right  ventricular size is normal. Tricuspid regurgitation signal is inadequate  for assessing PA pressure.   3. Right atrial size was moderately dilated.   4. The mitral valve is degenerative. Trivial mitral valve regurgitation.  No evidence of mitral stenosis.   5. Very difficult to visulalize aortic valve leaflets. . The aortic valve  is calcified. Aortic valve regurgitation is trivial. Aortic valve  sclerosis is present, with no evidence of aortic valve stenosis.   6. Aortic dilatation noted. There is mild dilatation of the aortic root,  measuring 40 mm.   7. The inferior vena cava is normal in size with greater than 50%  respiratory variability, suggesting right atrial pressure of 3  mmHg.     Neuro/Psych  Neuromuscular disease    GI/Hepatic Neg liver ROS,GERD  Medicated,,  Endo/Other  negative endocrine ROS    Renal/GU negative Renal ROS     Musculoskeletal  (+) Arthritis ,    Abdominal   Peds  Hematology  (+) Blood dyscrasia, anemia   Anesthesia Other Findings Recurrent epistaxis  Reproductive/Obstetrics                              Anesthesia Physical Anesthesia Plan  ASA: 3  Anesthesia Plan: General   Post-op Pain Management: Tylenol  PO (pre-op)*   Induction: Intravenous  PONV Risk Score and Plan: 3 and Dexamethasone , Ondansetron  and Treatment may vary due to age or medical condition  Airway Management Planned: Oral ETT  Additional Equipment:   Intra-op Plan:   Post-operative Plan: Extubation in OR  Informed Consent: I have reviewed the patients History and Physical, chart, labs and discussed the procedure including the risks, benefits and alternatives for the proposed anesthesia with the patient or authorized representative who has indicated his/her understanding and acceptance.     Dental advisory given  Plan Discussed with: CRNA  Anesthesia Plan Comments: (PAT note by Lynwood Hope, PA-C:  83 year old male with pertinent history including former smoker (35 pack years, quit 2004) with associated COPD Gold 3, asthma, GERD on PPI and H2 blocker, HTN, CAD s/p inferior MI 1999 with PCI to RCA), AAA s/p EVAR 07/29/2023 (stable with no endoleak by CTA 08/29/23).  Nuclear stress test 05/24/2022 was low risk.  Last seen in pulmonology follow-up by Dr. Milagros  on 01/11/2024 for history of severe COPD.  Discussed at that time that he was not having good symptom control on Trelegy; had been doing better on Breztri  but was not covered by insurance.  Patient had also just been seen by PCP Dr. Bluford on 01/10/2024 for COPD exacerbation, treated with azithromycin  and prednisone .  Fortunately, patient was subsequently able to get  Breztri  approved by insurance  Recent admission 9/20 - 02/05/2024 after presenting with generalized weakness.  He was noted to be in A-fib with RVR and converted to sinus rhythm with diltiazem .  Echo showed LVEF 55 to 60%, normal wall motion, grade 1 DD, low normal RVEF, no significant valvular abnormalities.  Cardiology was consulted and he was recommended to start Eliquis 5 mg twice daily, however, he was noted to have history of recurrent epistaxis.  Final decision was deferred to outpatient management.  He was started on metoprolol  12.5 mg twice daily.  He had outpatient follow-up with PCP Dr. Jacqulyn Bluford on 02/07/2024 and was recommended to follow-up with ENT for management of recurrent epistaxis prior to starting anticoagulation.  Dr. Carlie recommended nasal endoscopy with cautery of right side site and clipping of the left sphenopalatine site prior to starting anticoagulation therapy.  CMP and CBC 02/05/2024 reviewed, hypoalbuminemia with albumin  2.9, mild anemia with hemoglobin 12.3, otherwise unremarkable.  EKG 02/04/2024: Sinus rhythm.  Rate 96. Atrial premature complexes in couplets. Probable anteroseptal infarct, old  TTE 02/05/2024: 1. Left ventricular ejection fraction, by estimation, is 55 to 60%. Left  ventricular ejection fraction by 2D MOD biplane is 55.7 %. The left  ventricle has normal function. The left ventricle has no regional wall  motion abnormalities. Left ventricular  diastolic parameters are consistent with Grade I diastolic dysfunction  (impaired relaxation).  2. Right ventricular systolic function is low normal. The right  ventricular size is normal. Tricuspid regurgitation signal is inadequate  for assessing PA pressure.  3. Right atrial size was moderately dilated.  4. The mitral valve is degenerative. Trivial mitral valve regurgitation.  No evidence of mitral stenosis.  5. Very difficult to visulalize aortic valve leaflets. . The aortic valve  is calcified.  Aortic valve regurgitation is trivial. Aortic valve  sclerosis is present, with no evidence of aortic valve stenosis.  6. Aortic dilatation noted. There is mild dilatation of the aortic root,  measuring 40 mm.  7. The inferior vena cava is normal in size with greater than 50%  respiratory variability, suggesting right atrial pressure of 3 mmHg.   Nuclear stress 05/24/2022:   No ST deviation was noted during pharmacological stress.   There is a fixed medium defect with mild reduction in uptake in the inferior wall with abnormal regional wall function consistent with prior infarction with mild peri-infarct ischemia. The study is low risk.   Left ventricular function is mildly reduced at 45%. Consider Echocardiogram for accurate estimation of left ventricular systolic function.   )         Anesthesia Quick Evaluation

## 2024-02-14 NOTE — Progress Notes (Signed)
 SDW CALL  Patient was given pre-op instructions over the phone. The opportunity was given for the patient to ask questions. No further questions asked. Patient verbalized understanding of instructions given.   PCP - Cook, Jayce G, DO Cardiologist - Delford Maude BROCKS, MD  PPM/ICD -  Device Orders -  Rep Notified -   Chest x-ray - 02/04/24 EKG - 02/04/24 Stress Test - 05/24/22 ECHO - 02/05/24 Cardiac Cath - 03/20/12  Sleep Study - denies CPAP - no  Fasting Blood Sugar - na Checks Blood Sugar _____ times a day  Blood Thinner Instructions:na Aspirin  Instructions:follow Dr. Ilona instructions.   ERAS Protcol - clears until 0530 PRE-SURGERY Ensure or G2- no  COVID TEST- na   Anesthesia review: yes- new onset Afib,hx COPD,HTN,MI,AAA  Patient denies shortness of breath, fever, cough and chest pain over the phone call  Oral Hygiene is also important to reduce your risk of infection.  Remember - BRUSH YOUR TEETH THE MORNING OF SURGERY WITH YOUR REGULAR TOOTHPASTE

## 2024-02-15 ENCOUNTER — Ambulatory Visit (HOSPITAL_COMMUNITY)
Admission: RE | Admit: 2024-02-15 | Discharge: 2024-02-15 | Disposition: A | Discharge status: Home / Self Care | Attending: Otolaryngology | Admitting: Otolaryngology

## 2024-02-15 ENCOUNTER — Ambulatory Visit (HOSPITAL_COMMUNITY): Payer: Self-pay | Admitting: Physician Assistant

## 2024-02-15 ENCOUNTER — Encounter (HOSPITAL_COMMUNITY)
Admission: RE | Disposition: A | Discharge status: Home / Self Care | Payer: Self-pay | Source: Home / Self Care | Attending: Otolaryngology

## 2024-02-15 ENCOUNTER — Other Ambulatory Visit: Payer: Self-pay

## 2024-02-15 ENCOUNTER — Encounter (HOSPITAL_COMMUNITY): Payer: Self-pay | Admitting: Otolaryngology

## 2024-02-15 DIAGNOSIS — I2510 Atherosclerotic heart disease of native coronary artery without angina pectoris: Secondary | ICD-10-CM

## 2024-02-15 DIAGNOSIS — Z87891 Personal history of nicotine dependence: Secondary | ICD-10-CM | POA: Insufficient documentation

## 2024-02-15 DIAGNOSIS — I252 Old myocardial infarction: Secondary | ICD-10-CM | POA: Diagnosis not present

## 2024-02-15 DIAGNOSIS — I10 Essential (primary) hypertension: Secondary | ICD-10-CM

## 2024-02-15 DIAGNOSIS — Z95828 Presence of other vascular implants and grafts: Secondary | ICD-10-CM | POA: Diagnosis not present

## 2024-02-15 DIAGNOSIS — J324 Chronic pansinusitis: Secondary | ICD-10-CM | POA: Diagnosis not present

## 2024-02-15 DIAGNOSIS — J342 Deviated nasal septum: Secondary | ICD-10-CM | POA: Insufficient documentation

## 2024-02-15 DIAGNOSIS — K219 Gastro-esophageal reflux disease without esophagitis: Secondary | ICD-10-CM | POA: Insufficient documentation

## 2024-02-15 DIAGNOSIS — I4891 Unspecified atrial fibrillation: Secondary | ICD-10-CM | POA: Diagnosis not present

## 2024-02-15 DIAGNOSIS — I714 Abdominal aortic aneurysm, without rupture, unspecified: Secondary | ICD-10-CM | POA: Diagnosis not present

## 2024-02-15 DIAGNOSIS — Z79899 Other long term (current) drug therapy: Secondary | ICD-10-CM | POA: Insufficient documentation

## 2024-02-15 DIAGNOSIS — Z7951 Long term (current) use of inhaled steroids: Secondary | ICD-10-CM | POA: Diagnosis not present

## 2024-02-15 DIAGNOSIS — J4489 Other specified chronic obstructive pulmonary disease: Secondary | ICD-10-CM | POA: Diagnosis not present

## 2024-02-15 DIAGNOSIS — R040 Epistaxis: Secondary | ICD-10-CM | POA: Insufficient documentation

## 2024-02-15 HISTORY — PX: SEPTOPLASTY: SHX2393

## 2024-02-15 HISTORY — PX: NASAL SINUS SURGERY: SHX719

## 2024-02-15 HISTORY — PX: NASAL ENDOSCOPY WITH EPISTAXIS CONTROL: SHX5664

## 2024-02-15 LAB — CBC
HCT: 43.2 % (ref 39.0–52.0)
Hemoglobin: 13.9 g/dL (ref 13.0–17.0)
MCH: 29.2 pg (ref 26.0–34.0)
MCHC: 32.2 g/dL (ref 30.0–36.0)
MCV: 90.8 fL (ref 80.0–100.0)
Platelets: 186 K/uL (ref 150–400)
RBC: 4.76 MIL/uL (ref 4.22–5.81)
RDW: 13.8 % (ref 11.5–15.5)
WBC: 8 K/uL (ref 4.0–10.5)
nRBC: 0 % (ref 0.0–0.2)

## 2024-02-15 LAB — BASIC METABOLIC PANEL WITH GFR
Anion gap: 7 (ref 5–15)
BUN: 9 mg/dL (ref 8–23)
CO2: 28 mmol/L (ref 22–32)
Calcium: 8.9 mg/dL (ref 8.9–10.3)
Chloride: 101 mmol/L (ref 98–111)
Creatinine, Ser: 0.93 mg/dL (ref 0.61–1.24)
GFR, Estimated: >60 mL/min (ref >60)
Glucose, Bld: 111 mg/dL — ABNORMAL HIGH (ref 70–99)
Potassium: 3.7 mmol/L (ref 3.5–5.1)
Sodium: 136 mmol/L (ref 135–145)

## 2024-02-15 SURGERY — SINUS SURGERY, ENDOSCOPIC
Anesthesia: General | Site: Nose | Laterality: Right

## 2024-02-15 MED ORDER — ONDANSETRON HCL 4 MG/2ML IJ SOLN: 4.0000 mg | Freq: Once | INTRAMUSCULAR | Status: DC | PRN

## 2024-02-15 MED ORDER — HYDROCODONE-ACETAMINOPHEN 5-325 MG PO TABS: 1.0000 | ORAL_TABLET | Freq: Four times a day (QID) | ORAL | 0 refills | Status: DC | PRN

## 2024-02-15 MED ORDER — OXYMETAZOLINE HCL 0.05 % NA SOLN
NASAL | Status: AC
  Filled 2024-02-15: qty 30

## 2024-02-15 MED ORDER — ROCURONIUM BROMIDE 10 MG/ML (PF) SYRINGE
PREFILLED_SYRINGE | INTRAVENOUS | Status: DC | PRN
  Administered 2024-02-15: 50 mg via INTRAVENOUS

## 2024-02-15 MED ORDER — SODIUM CHLORIDE 0.9 % IR SOLN
Status: DC | PRN
  Administered 2024-02-15: 1000 mL

## 2024-02-15 MED ORDER — MUPIROCIN 2 % EX OINT
TOPICAL_OINTMENT | CUTANEOUS | Status: DC | PRN
  Administered 2024-02-15: 1 via NASAL

## 2024-02-15 MED ORDER — MUPIROCIN 2 % EX OINT
TOPICAL_OINTMENT | CUTANEOUS | Status: AC
  Filled 2024-02-15: qty 22

## 2024-02-15 MED ORDER — ACETAMINOPHEN 500 MG PO TABS
1000.0000 mg | ORAL_TABLET | Freq: Once | ORAL | Status: AC
  Administered 2024-02-15: 1000 mg via ORAL
  Filled 2024-02-15: qty 2

## 2024-02-15 MED ORDER — VANCOMYCIN HCL IN DEXTROSE 1-5 GM/200ML-% IV SOLN
1000.0000 mg | INTRAVENOUS | Status: AC
  Administered 2024-02-15: 1000 mg via INTRAVENOUS
  Filled 2024-02-15: qty 200

## 2024-02-15 MED ORDER — PROPOFOL 10 MG/ML IV BOLUS
INTRAVENOUS | Status: AC
  Filled 2024-02-15: qty 20

## 2024-02-15 MED ORDER — ROCURONIUM BROMIDE 10 MG/ML (PF) SYRINGE
PREFILLED_SYRINGE | INTRAVENOUS | Status: AC
  Filled 2024-02-15: qty 10

## 2024-02-15 MED ORDER — FENTANYL CITRATE (PF) 100 MCG/2ML IJ SOLN: 25.0000 ug | INTRAMUSCULAR | Status: DC | PRN

## 2024-02-15 MED ORDER — LIDOCAINE-EPINEPHRINE 1 %-1:100000 IJ SOLN
INTRAMUSCULAR | Status: DC | PRN
  Administered 2024-02-15: 3 mL

## 2024-02-15 MED ORDER — PHENYLEPHRINE 80 MCG/ML (10ML) SYRINGE FOR IV PUSH (FOR BLOOD PRESSURE SUPPORT)
PREFILLED_SYRINGE | INTRAVENOUS | Status: AC
Start: 2024-02-15 — End: 2024-02-15
  Filled 2024-02-15: qty 10

## 2024-02-15 MED ORDER — FENTANYL CITRATE (PF) 250 MCG/5ML IJ SOLN
INTRAMUSCULAR | Status: AC
  Filled 2024-02-15: qty 5

## 2024-02-15 MED ORDER — OXYMETAZOLINE HCL 0.05 % NA SOLN
NASAL | Status: DC | PRN
  Administered 2024-02-15: 1

## 2024-02-15 MED ORDER — PHENYLEPHRINE HCL-NACL 20-0.9 MG/250ML-% IV SOLN
INTRAVENOUS | Status: DC | PRN
  Administered 2024-02-15: 25 ug/min via INTRAVENOUS

## 2024-02-15 MED ORDER — EPHEDRINE SULFATE-NACL 50-0.9 MG/10ML-% IV SOSY
PREFILLED_SYRINGE | INTRAVENOUS | Status: DC | PRN
  Administered 2024-02-15 (×4): 5 mg via INTRAVENOUS

## 2024-02-15 MED ORDER — DEXAMETHASONE SODIUM PHOSPHATE 10 MG/ML IJ SOLN
INTRAMUSCULAR | Status: AC
  Filled 2024-02-15: qty 1

## 2024-02-15 MED ORDER — CHLORHEXIDINE GLUCONATE 0.12 % MT SOLN
15.0000 mL | Freq: Once | OROMUCOSAL | Status: AC
  Administered 2024-02-15: 15 mL via OROMUCOSAL
  Filled 2024-02-15: qty 15

## 2024-02-15 MED ORDER — ONDANSETRON HCL 4 MG/2ML IJ SOLN
INTRAMUSCULAR | Status: AC
  Filled 2024-02-15: qty 2

## 2024-02-15 MED ORDER — PROPOFOL 500 MG/50ML IV EMUL
INTRAVENOUS | Status: DC | PRN
  Administered 2024-02-15: 75 ug/kg/min via INTRAVENOUS

## 2024-02-15 MED ORDER — EPHEDRINE 5 MG/ML INJ
INTRAVENOUS | Status: AC
  Filled 2024-02-15: qty 5

## 2024-02-15 MED ORDER — LIDOCAINE-EPINEPHRINE 1 %-1:100000 IJ SOLN
INTRAMUSCULAR | Status: AC
  Filled 2024-02-15: qty 1

## 2024-02-15 MED ORDER — ORAL CARE MOUTH RINSE: 15.0000 mL | Freq: Once | OROMUCOSAL | Status: AC

## 2024-02-15 MED ORDER — SUGAMMADEX SODIUM 200 MG/2ML IV SOLN
INTRAVENOUS | Status: DC | PRN
  Administered 2024-02-15: 100 mg via INTRAVENOUS
  Administered 2024-02-15: 200 mg via INTRAVENOUS

## 2024-02-15 MED ORDER — LACTATED RINGERS IV SOLN: INTRAVENOUS | Status: DC

## 2024-02-15 MED ORDER — FENTANYL CITRATE (PF) 250 MCG/5ML IJ SOLN
INTRAMUSCULAR | Status: DC | PRN
  Administered 2024-02-15: 100 ug via INTRAVENOUS

## 2024-02-15 MED ORDER — ONDANSETRON HCL 4 MG/2ML IJ SOLN
INTRAMUSCULAR | Status: DC | PRN
  Administered 2024-02-15: 4 mg via INTRAVENOUS

## 2024-02-15 MED ORDER — PROPOFOL 10 MG/ML IV BOLUS
INTRAVENOUS | Status: DC | PRN
  Administered 2024-02-15: 100 mg via INTRAVENOUS
  Administered 2024-02-15 (×2): 30 mg via INTRAVENOUS

## 2024-02-15 MED ORDER — LIDOCAINE 2% (20 MG/ML) 5 ML SYRINGE
INTRAMUSCULAR | Status: AC
  Filled 2024-02-15: qty 5

## 2024-02-15 MED ORDER — LIDOCAINE 2% (20 MG/ML) 5 ML SYRINGE
INTRAMUSCULAR | Status: DC | PRN
  Administered 2024-02-15: 100 mg via INTRAVENOUS

## 2024-02-15 SURGICAL SUPPLY — 44 items
BLADE RAD40 ROTATE 4M 4 5PK (BLADE) IMPLANT
BLADE RAD60 ROTATE M4 4 5PK (BLADE) IMPLANT
BLADE TRICUT ROTATE M4 4 5PK (BLADE) ×3 IMPLANT
CANISTER SUCTION 3000ML PPV (SUCTIONS) ×3 IMPLANT
CLIP TI MEDIUM 6 (CLIP) IMPLANT
CLIP TI WIDE RED SMALL 6 (CLIP) IMPLANT
COAGULATOR SUCT SWTCH 10FR 6 (ELECTROSURGICAL) IMPLANT
COVER LIGHT HANDLE STERIS (MISCELLANEOUS) IMPLANT
DRAPE HALF SHEET 40X57 (DRAPES) IMPLANT
DRESSING MEROCEL 8CM (GAUZE/BANDAGES/DRESSINGS) IMPLANT
DRESSING NASAL POPE 10X1.5X2.5 (GAUZE/BANDAGES/DRESSINGS) IMPLANT
DRSG NASOPORE 8CM (GAUZE/BANDAGES/DRESSINGS) IMPLANT
DRSG TELFA 3X8 NADH STRL (GAUZE/BANDAGES/DRESSINGS) IMPLANT
ELECTRODE REM PT RTRN 9FT ADLT (ELECTROSURGICAL) IMPLANT
GLOVE BIO SURGEON STRL SZ7.5 (GLOVE) ×3 IMPLANT
GOWN STRL REUS W/ TWL LRG LVL3 (GOWN DISPOSABLE) ×6 IMPLANT
KIT BASIN OR (CUSTOM PROCEDURE TRAY) ×3 IMPLANT
KIT TURNOVER KIT B (KITS) ×3 IMPLANT
NDL HYPO 25GX1X1/2 BEV (NEEDLE) IMPLANT
NDL PRECISIONGLIDE 27X1.5 (NEEDLE) ×3 IMPLANT
NDL SPNL 22GX3.5 QUINCKE BK (NEEDLE) ×3 IMPLANT
NDL SPNL 25GX3.5 QUINCKE BL (NEEDLE) IMPLANT
NEEDLE HYPO 25GX1X1/2 BEV (NEEDLE) IMPLANT
NEEDLE PRECISIONGLIDE 27X1.5 (NEEDLE) ×3 IMPLANT
NEEDLE SPNL 22GX3.5 QUINCKE BK (NEEDLE) ×3 IMPLANT
NEEDLE SPNL 25GX3.5 QUINCKE BL (NEEDLE) IMPLANT
PAD ARMBOARD POSITIONER FOAM (MISCELLANEOUS) ×6 IMPLANT
PATTIES SURGICAL .5 X3 (DISPOSABLE) ×3 IMPLANT
POSITIONER HEAD DONUT 9IN (MISCELLANEOUS) IMPLANT
SHEATH ENDOSCRUB 0 DEG (SHEATH) ×3 IMPLANT
SHEATH ENDOSCRUB 30 DEG (SHEATH) ×3 IMPLANT
SOL ANTI FOG 6CC (MISCELLANEOUS) ×3 IMPLANT
SOLN 0.9% NACL 1000 ML (IV SOLUTION) ×3 IMPLANT
SOLN 0.9% NACL POUR BTL 1000ML (IV SOLUTION) ×3 IMPLANT
SOLN STERILE WATER 1000 ML (IV SOLUTION) ×3 IMPLANT
SOLN STERILE WATER BTL 1000 ML (IV SOLUTION) ×3 IMPLANT
SPLINT NASAL DOYLE BI-VL (GAUZE/BANDAGES/DRESSINGS) IMPLANT
SUT CHROMIC 2 0 SH (SUTURE) IMPLANT
SUT CHROMIC 4 0 RB 1X27 (SUTURE) IMPLANT
SWAB COLLECTION DEVICE MRSA (MISCELLANEOUS) IMPLANT
SYR 50ML SLIP (SYRINGE) IMPLANT
TOWEL GREEN STERILE FF (TOWEL DISPOSABLE) ×3 IMPLANT
TRAY ENT MC OR (CUSTOM PROCEDURE TRAY) ×3 IMPLANT
TUBE CONNECTING 12X1/4 (SUCTIONS) ×3 IMPLANT

## 2024-02-15 NOTE — Transfer of Care (Signed)
 Immediate Anesthesia Transfer of Care Note  Patient: [name] [name]  Procedure(s) Performed: BILATERAL NASAL ENDOSCOPY W/ CAUTERY AND LEFT SPHENOPALATINE CLIPPING (Bilateral: Nose) SEPTOPLASTY, NOSE (Left: Nose) CONTROL OF EPISTAXIS RIGHT SIDE (Right: Nose)  Patient Location: PACU  Anesthesia Type:General  Level of Consciousness: awake, alert , patient cooperative, and responds to stimulation  Airway & Oxygen Therapy: Patient Spontanous Breathing and Patient connected to face mask oxygen  Post-op Assessment: Report given to RN, Post -op Vital signs reviewed and stable, and Patient moving all extremities X 4  Post vital signs: Reviewed and stable  Last Vitals:  Vitals Value Taken Time  BP 129/86 02/15/24 09:55  Temp 36.5 C 02/15/24 09:53  Pulse 70 02/15/24 09:58  Resp 11 02/15/24 09:58  SpO2 94 % 02/15/24 09:58  Vitals shown include unfiled device data.  Last Pain:  Vitals:   02/15/24 0642  TempSrc: Oral         Complications: No notable events documented.

## 2024-02-15 NOTE — H&P (Signed)
 [name] [name] is an 83 y.o. male.   Chief Complaint: Epistaxis HPI: 83 year old male with recurring epistaxis, having previously underwent surgical intervention.  With continued recurrences, he presents for surgical management.  Past Medical History:  Diagnosis Date   AAA (abdominal aortic aneurysm)    needs yearly ultrasound   Allergy    Anemia    Arthritis    Asthma    BCC (basal cell carcinoma) 08/18/1989   left shoulder blad, upper right arm, left arm beyond elbow, c&d   BCC (basal cell carcinoma) 01/31/1992   Posterior neck, curetx3, 47fu   BCC (basal cell carcinoma) 11/22/2001   mid forehead, cx3, excision, right forearm, cx3, 47fu   BCC (basal cell carcinoma) 10/09/2003   mid forehead, MOHs   BCC (basal cell carcinoma) 08/15/2008   upper left back, biopsy   BPH (benign prostatic hyperplasia)    CAD (coronary artery disease)    Cancer (HCC)    skin cancer   COPD (chronic obstructive pulmonary disease) (HCC)    Dysrhythmia    pt. states it can be fast at times   GERD (gastroesophageal reflux disease)    Glaucoma    History of acute pancreatitis 12/21/2022   HOH (hard of hearing)    Hypercholesterolemia    Hypertension    Impaired fasting glucose    Low back pain    Melanoma in situ (HCC) 10/09/2003   left chin, MOHs   MI (myocardial infarction) (HCC) 1999   Peripheral vascular disease    AAA   SCC (squamous cell carcinoma) 07/03/2014   in situ, behind left ear, cx3, cautery, 47fu   SCC (squamous cell carcinoma) 07/03/2014   well diff, left forearm, biopsy, cx1, cautery   SCC (squamous cell carcinoma) 07/20/2017   in situ, left upper arm, cx3, 63fu   SCC (squamous cell carcinoma) 01/10/2019   in situ, left post shoulder, cx3, 28fu   SCC (squamous cell carcinoma) 11/22/2001   left forearm distal, left forearm, cx3, 45fu   SCC (squamous cell carcinoma) 10/09/2003   Bowens, left ear post, clear per st, right cheek clear   SCC (squamous cell carcinoma) 03/30/2004   in  situ, left upper arm, cx3, 66fu   SCC (squamous cell carcinoma) 03/08/2005   in situ, right cheek, mid upper forehead, cx3, 54fu   SCC (squamous cell carcinoma) 06/08/2006   in situ, left shoulder, cx3, 75fu   SCC (squamous cell carcinoma) 05/05/2010   right inner wrist, biopsy   SCC (squamous cell carcinoma) 09/13/2013   in situ, right crown scalp, front scalp, biopsy   Thrush     Past Surgical History:  Procedure Laterality Date   ABDOMINAL AORTIC ENDOVASCULAR STENT GRAFT N/A 07/29/2023   Procedure: INSERTION, ENDOVASCULAR STENT GRAFT, AORTA, ABDOMINAL;  Surgeon: Gretta Lonni PARAS, MD;  Location: [facility] OR;  Service: Vascular;  Laterality: N/A;   BACK SURGERY     x 3   CARDIAC CATHETERIZATION     angioplasty   CATARACT EXTRACTION W/PHACO  03/20/2012   Procedure: CATARACT EXTRACTION PHACO AND INTRAOCULAR LENS PLACEMENT (IOC);  Surgeon: Dow JULIANNA Burke, MD;  Location: [facility];  Service: Ophthalmology;  Laterality: Right;  CDE:  8.45   CATARACT EXTRACTION W/PHACO Left 04/02/2013   Procedure: CATARACT EXTRACTION PHACO AND INTRAOCULAR LENS PLACEMENT (IOC);  Surgeon: Dow JULIANNA Burke, MD;  Location: [facility];  Service: Ophthalmology;  Laterality: Left;  CDE:  6.50   CHOLECYSTECTOMY  2000   COLONOSCOPY  2009   repeat  5 years   ESOPHAGOGASTRODUODENOSCOPY     HERNIA REPAIR Left    inguinal   INGUINAL HERNIA REPAIR Right 03/28/2020   Procedure: Right Inguinal Herniorrhaphy with Mesh;  Surgeon: Mavis Anes, MD;  Location: [facility];  Service: General;  Laterality: Right;   LAPAROSCOPIC PARTIAL COLECTOMY N/A 06/11/2013   Procedure: LAPAROSCOPIC HAND ASSISTED PARTIAL COLECTOMY;  Surgeon: Anes DELENA Mavis, MD;  Location: [facility];  Service: General;  Laterality: N/A;   NASAL ENDOSCOPY WITH EPISTAXIS CONTROL Bilateral 02/11/2020   Procedure: NASAL ENDOSCOPY WITH EPISTAXIS CONTROL;  Surgeon: Karis Clunes, MD;  Location: [facility];  Service: ENT;  Laterality: Bilateral;   REPAIR ILIAC ARTERY Right  07/29/2023   Procedure: REPAIR, ARTERY, FEMORAL;  Surgeon: Gretta Lonni PARAS, MD;  Location: [facility] OR;  Service: Vascular;  Laterality: Right;   right eye detached retina Bilateral    SPINAL FUSION  2016   ULTRASOUND GUIDANCE FOR VASCULAR ACCESS Bilateral 07/29/2023   Procedure: ULTRASOUND GUIDANCE, FOR VASCULAR ACCESS;  Surgeon: Gretta Lonni PARAS, MD;  Location: [facility] OR;  Service: Vascular;  Laterality: Bilateral;   YAG LASER APPLICATION Left 05/06/2014   Procedure: YAG LASER APPLICATION;  Surgeon: Dow JULIANNA Burke, MD;  Location: [facility];  Service: Ophthalmology;  Laterality: Left;    Family History  Problem Relation Age of Onset   Hypertension Mother    COPD Father    Cancer Brother        brain   Social History:  reports that he quit smoking about 20 years ago. His smoking use included cigarettes. He started smoking about 55 years ago. He has a 35 pack-year smoking history. He has never used smokeless tobacco. He reports that he does not drink alcohol and does not use drugs.  Allergies:  Allergies  Allergen Reactions   Bactrim  [Sulfamethoxazole -Trimethoprim ] Hives   Beta Adrenergic Blockers Diarrhea    Currently taking   Ciprofloxacin  Nausea And Vomiting, Rash and Other (See Comments)    Body aches    Doxycycline  Swelling    Lips swelling and skin peeling around mouth   Lasix  [Furosemide ] Rash   Dexamethasone  Swelling   Gabapentin  Swelling    Legs and feet swelling   Methocarbamol Swelling and Rash   Myrbetriq  [Mirabegron ] Hives and Itching   Neomycin Swelling and Rash   Penicillins Rash, Swelling and Dermatitis   Tetracyclines & Related Itching    Medications Prior to Admission  Medication Sig Dispense Refill   albuterol  (PROVENTIL ) (2.5 MG/3ML) 0.083% nebulizer solution Take 3 mLs (2.5 mg total) by nebulization every 6 (six) hours as needed for wheezing or shortness of breath. 360 mL 5   albuterol  (VENTOLIN  HFA) 108 (90 Base) MCG/ACT inhaler Inhale 2 puffs into the  lungs every 4 (four) hours as needed for wheezing or shortness of breath. 18 each 0   aspirin  EC 81 MG tablet Take 1 tablet (81 mg total) by mouth daily at 6 (six) AM. Swallow whole. 30 tablet 12   cetirizine (ZYRTEC) 10 MG tablet Take 10 mg by mouth daily.     esomeprazole  (NEXIUM ) 40 MG capsule Take 30-60 min before first meal of the day 30 capsule 11   famotidine  (PEPCID ) 20 MG tablet One after supper (Patient taking differently: Take 20 mg by mouth daily before supper. One after supper) 30 tablet 11   fluticasone -salmeterol (ADVAIR  HFA) 230-21 MCG/ACT inhaler Take 2 puffs first thing in am and then another 2 puffs about 12 hours later. 1 each 12   latanoprost  (  XALATAN ) 0.005 % ophthalmic solution Place 1 drop into both eyes at bedtime.     LORazepam  (ATIVAN ) 0.5 MG tablet Take 1 tablet (0.5 mg total) by mouth at bedtime as needed. (Patient taking differently: Take 0.5 mg by mouth at bedtime.) 30 tablet 3   lovastatin  (MEVACOR ) 40 MG tablet Take 1 tablet (40 mg total) by mouth daily. 90 tablet 0   metoprolol  tartrate (LOPRESSOR ) 25 MG tablet Take 0.5 tablets (12.5 mg total) by mouth 2 (two) times daily. 60 tablet 1   metroNIDAZOLE  (METROGEL ) 0.75 % gel Apply 1 Application topically daily as needed (rosacea).     oxymetazoline  (AFRIN) 0.05 % nasal spray Place 1 spray into both nostrils 2 (two) times daily as needed (Nose bleed).     tamsulosin  (FLOMAX ) 0.4 MG CAPS capsule Take 2 capsules (0.8 mg total) by mouth daily. 180 capsule 01   triamcinolone  cream (KENALOG ) 0.1 % Apply 1 Application topically 2 (two) times daily as needed (itching).     cefUROXime  (CEFTIN ) 500 MG tablet Take 1 tablet (500 mg total) by mouth in the morning, at noon, and at bedtime for 14 days. (Patient not taking: Reported on 02/10/2024) 42 tablet 0   Spacer/Aero-Holding [facility] Use as directed 1 each 0    Results for orders placed or performed during the hospital encounter of 02/15/24 (from the past 48 hours)  Basic  metabolic panel per protocol     Status: Abnormal   Collection Time: 02/15/24  6:52 AM  Result Value Ref Range   Sodium 136 135 - 145 mmol/L   Potassium 3.7 3.5 - 5.1 mmol/L   Chloride 101 98 - 111 mmol/L   CO2 28 22 - 32 mmol/L   Glucose, Bld 111 (H) 70 - 99 mg/dL    Comment: Glucose reference range applies only to samples taken after fasting for at least 8 hours.   BUN 9 8 - 23 mg/dL   Creatinine, Ser 9.06 0.61 - 1.24 mg/dL   Calcium  8.9 8.9 - 10.3 mg/dL   GFR, Estimated >39 >39 mL/min    Comment: (NOTE) Calculated using the CKD-EPI Creatinine Equation (2021)    Anion gap 7 5 - 15    Comment: Performed at [facility], [address] [place], [place] [address]  CBC per protocol     Status: None   Collection Time: 02/15/24  6:52 AM  Result Value Ref Range   WBC 8.0 4.0 - 10.5 K/uL   RBC 4.76 4.22 - 5.81 MIL/uL   Hemoglobin 13.9 13.0 - 17.0 g/dL   HCT 56.7 60.9 - 47.9 %   MCV 90.8 80.0 - 100.0 fL   MCH 29.2 26.0 - 34.0 pg   MCHC 32.2 30.0 - 36.0 g/dL   RDW 86.1 88.4 - 84.4 %   Platelets 186 150 - 400 K/uL   nRBC 0.0 0.0 - 0.2 %    Comment: Performed at [facility], [address] [place], [place] [address]   *Note: Due to a large number of results and/or encounters for the requested time period, some results have not been displayed. A complete set of results can be found in Results Review.   No results found.  Review of Systems  All other systems reviewed and are negative.   Blood pressure (!) 145/88, pulse 69, temperature 98.2 F (36.8 C), temperature source Oral, resp. rate 17, height 6' 1 (1.854 m), weight 92.5 kg, SpO2 97%. Physical Exam Constitutional:  Appearance: Normal appearance. He is normal weight.  HENT:     Head: Normocephalic and atraumatic.     Right Ear: External ear normal.     Left Ear: External ear normal.     Nose: Nose normal.     Mouth/Throat:     Mouth: Mucous membranes are moist.     Pharynx: Oropharynx is clear.  Eyes:      Extraocular Movements: Extraocular movements intact.     Pupils: Pupils are equal, round, and reactive to light.  Cardiovascular:     Rate and Rhythm: Normal rate.  Pulmonary:     Effort: Pulmonary effort is normal.  Skin:    General: Skin is warm and dry.  Neurological:     General: No focal deficit present.     Mental Status: He is alert and oriented to person, place, and time.  Psychiatric:        Mood and Affect: Mood normal.        Behavior: Behavior normal.        Thought Content: Thought content normal.        Judgment: Judgment normal.      Assessment/Plan Recurrent epistaxis  To OR for endoscopic right nasal cautery and left sphenopalatine clipping.  Vaughan Ricker, MD 02/15/2024, 8:24 AM

## 2024-02-15 NOTE — Op Note (Signed)
 PREOPERATIVE DIAGNOSIS:  Recurrent epistaxis   POSTOPERATIVE DIAGNOSIS:  Recurrent epistaxis, septal deviation   PROCEDURE:  Nasal endoscopy with control of epistaxis, right.  Sphenopalatine ligation, left.  Septoplasty.   SURGEON:  Vaughan Ricker, MD   ANESTHESIA:  General endotracheal anesthesia   COMPLICATIONS:  None   INDICATIONS:  The patient is an 83 year old male with a history of recurrent epistaxis who has plans to start anti-coagulation due to atrial fibrillation.  He presents to the operating room for surgical management based on endoscopic findings in the office.   FINDINGS:  Small red bump on inferior right mid-septum cauterized along with a couple of other bleeding sites on the septum.  On the left, the sphenopalatine stump was exposed and ligated with Hemoclips.  The septum was markedly deviated to the left with bony spur, limiting access to posterior nasal passage and requiring septoplasty.   DESCRIPTION OF PROCEDURE:  The patient was identified in the holding room, informed consent having been obtained including discussion of risks, benefits and alternatives, the patient was brought to the operative suite and put the operative table in  supine position.  Anesthesia was induced and the patient was intubated by the anesthesia team without difficulty.  The eyes were taped closed.  Afrin-soaked pledgets were placed in both sides of the nose for a few minutes.  The right-sided pledgets were removed and the nasal passage was inspected with a straight telescope.  The red bump at the lower mid-septum was cauterized with electrocautery on a setting of 20.  A couple of additional bleeding spots were likewise cauterized.  Afrin-soaked pledgets were replaced.  On the left, pledgets were removed and the nasal passage was inspected.  View of the posterior nasal passage was very limited by septal deviation.  Thus, the septum was then injected with local anesthetic on both sides.  A left-sided  Killian incision was made and the mucoperichondral flap was elevated down the left side.  The inferior deviation was then removed by removing a portion of cartilage and then isolating the bony portion.  An osteotome was used to remove this portion.  With some further removal of posterior bone, the septum laid in the midline.  Soft tissues were redraped with a tear in the left-sided flap.  At this point, the left nasal passage was again inspected with the telescope.  The posterior middle meatus region was injected with local anesthetic.  The mucosa anterior to the sphenopalatine region was incised and elevated from the underlying bone, isolating the sphenopalatine tissues.  A small and then medium Hemoclip were then placed.  Pledgets were replaced.  Pledgets were then removed.  Doyle stents coated in mupirocin  ointment were then placed in both nasal passages.  The Killian incision was closed with 4-0 chromic in a simple, interrupted fashion.  Stents were placed in position and secured with a through-and-through vertical mattress suture using 2-0 Vicryl.  The throat were suctioned.   Drapes were removed and the patient was cleaned off.  The patient was returned to anesthesia for wakeup, extubated, and taken to the recovery room in stable condition.

## 2024-02-15 NOTE — Anesthesia Postprocedure Evaluation (Signed)
 Anesthesia Post Note  Patient: [name] [name]  Procedure(s) Performed: BILATERAL NASAL ENDOSCOPY W/ CAUTERY AND LEFT SPHENOPALATINE CLIPPING (Bilateral: Nose) SEPTOPLASTY, NOSE (Left: Nose) CONTROL OF EPISTAXIS RIGHT SIDE (Right: Nose)     Patient location during evaluation: PACU Anesthesia Type: General Level of consciousness: awake and alert Pain management: pain level controlled Vital Signs Assessment: post-procedure vital signs reviewed and stable Respiratory status: spontaneous breathing, nonlabored ventilation, respiratory function stable and patient connected to nasal cannula oxygen Cardiovascular status: blood pressure returned to baseline and stable Postop Assessment: no apparent nausea or vomiting Anesthetic complications: no   No notable events documented.  Last Vitals:  Vitals:   02/15/24 1023 02/15/24 1030  BP: 126/83 137/78  Pulse: 65 70  Resp: 13 15  Temp:  36.5 C  SpO2: 95% 92%    Last Pain:  Vitals:   02/15/24 1023  TempSrc:   PainSc: 0-No pain                 Garnette FORBES Skillern

## 2024-02-15 NOTE — Anesthesia Procedure Notes (Signed)
 Procedure Name: Intubation Date/Time: 02/15/2024 8:41 AM  Performed by: Jolynn Mage, CRNAPre-anesthesia Checklist: Patient identified, Patient being monitored, Timeout performed, Emergency Drugs available and Suction available Patient Re-evaluated:Patient Re-evaluated prior to induction Oxygen Delivery Method: Circle System Utilized Preoxygenation: Pre-oxygenation with 100% oxygen Induction Type: IV induction Ventilation: Mask ventilation without difficulty Laryngoscope Size: Miller and 2 Grade View: Grade I Tube type: Oral Tube size: 7.5 mm Number of attempts: 1 Airway Equipment and Method: Stylet Placement Confirmation: ETT inserted through vocal cords under direct vision, positive ETCO2 and breath sounds checked- equal and bilateral Secured at: 23 cm Tube secured with: Tape Dental Injury: Teeth and Oropharynx as per pre-operative assessment

## 2024-02-15 NOTE — Telephone Encounter (Signed)
 Left a message for patient to call office regarding testing results.

## 2024-02-16 ENCOUNTER — Encounter (HOSPITAL_COMMUNITY): Payer: Self-pay | Admitting: Otolaryngology

## 2024-02-16 NOTE — Telephone Encounter (Signed)
 Patient is returning call.

## 2024-02-16 NOTE — Telephone Encounter (Signed)
 The patient has been notified of the result and verbalized understanding.  All questions (if any) were answered. Rosina JAYSON Cornea, CMA 02/16/2024 1:05 PM

## 2024-02-18 ENCOUNTER — Encounter: Payer: Self-pay | Admitting: Cardiovascular Disease

## 2024-02-20 DIAGNOSIS — J342 Deviated nasal septum: Secondary | ICD-10-CM | POA: Diagnosis not present

## 2024-02-20 DIAGNOSIS — R040 Epistaxis: Secondary | ICD-10-CM | POA: Diagnosis not present

## 2024-02-22 DIAGNOSIS — I480 Paroxysmal atrial fibrillation: Secondary | ICD-10-CM | POA: Diagnosis not present

## 2024-02-23 NOTE — Progress Notes (Signed)
 Patient ID: [name] [name], male   DOB: 07/27/1940, 83 y.o.   MRN: [id number]      83 y.o. history of AAA, CAD pancreatitis PCI RCA 1999 with low risk myovue 2018. Clinically stable Intolerant to beta blockers with diarrhea . 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 And weakness Had been on Cefuroxime  by primary He had brief episode PAF converted with cardizem . He refused anticoagulation. Placed on lopressor  12.5 mg bid despite prior intolerance. Echo with EF 55-60% trivial MR and AV sclerosis. No source of infection found Troponin 72/74 no trend On d/c event monitor ordered but not done   Had recurrent epistaxis and had surgery 02/15/24  with ENT Dr Carlie with sphenopalatine ligation on right and left septoplasty.  No blood thinners till f/u with ENT. Wearing monitor now   ROS: Denies fever, malais, weight loss, blurry vision, decreased visual acuity, cough, sputum, SOB, hemoptysis, pleuritic pain, palpitaitons, heartburn, abdominal pain, melena, lower extremity edema, claudication, or rash.  All other systems reviewed and negative  General: BP (!) 150/82   Pulse 70   Ht 6' 1 (1.854 m)   Wt 211 lb 3.2 oz (95.8 kg)   SpO2 98%   BMI 27.86 kg/m   Affect appropriate Healthy:  appears stated age HEENT: hard of hearing Aides in place  Neck supple with no adenopathy JVP normal no bruits no thyromegaly Lungs Mild exp wheezing and good  diaphragmatic motion Heart:  S1/S2 no murmur, no rub, gallop or click PMI normal Abdomen: benighn, BS positve, no tenderness, AAA palpable not tender  no bruit.  No HSM or HJR Post right hernia surgery repair but persistent  Umbilical hernia  Distal pulses intact with no bruits No edema Neuro non-focal Skin warm and dry No muscular weakness   Current Outpatient Medications  Medication Sig Dispense Refill   albuterol  (PROVENTIL ) (2.5 MG/3ML) 0.083% nebulizer solution Take 3 mLs (2.5 mg total) by nebulization every 6 (six) hours as needed for wheezing or shortness of breath. 360 mL 5   albuterol  (VENTOLIN  HFA) 108 (90 Base) MCG/ACT inhaler Inhale 2 puffs into the lungs every 4 (four) hours as needed for wheezing or shortness of breath. 18 each 0   aspirin  EC 81 MG tablet Take 1 tablet (81 mg total) by mouth daily at 6 (six) AM. Swallow whole. 30 tablet 12   cetirizine (ZYRTEC) 10 MG tablet Take 10 mg by mouth daily.     esomeprazole  (NEXIUM ) 40 MG capsule Take 30-60 min before first meal of the day 30 capsule 11   famotidine  (PEPCID ) 20 MG tablet One after supper (Patient taking differently: Take 20 mg by mouth daily before supper. One after supper) 30 tablet 11   fluticasone -salmeterol (ADVAIR  HFA) 230-21 MCG/ACT inhaler Take 2 puffs first thing in am and then another 2 puffs about 12 hours later. 1 each 12   HYDROcodone -acetaminophen  (NORCO/VICODIN) 5-325 MG  tablet Take 1-2 tablets by mouth every 6 (six) hours as needed. 12 tablet 0   latanoprost  (XALATAN ) 0.005 % ophthalmic solution Place 1 drop into both eyes at bedtime.     LORazepam  (ATIVAN ) 0.5 MG tablet Take 1 tablet (0.5 mg total) by mouth at bedtime as needed. (Patient taking differently: Take 0.5 mg by mouth at bedtime.) 30 tablet 3   lovastatin  (MEVACOR ) 40 MG tablet Take 1 tablet (40 mg total) by mouth daily. 90 tablet 0   metoprolol  tartrate (LOPRESSOR ) 25 MG tablet Take 0.5 tablets (12.5 mg total) by mouth 2 (two) times daily.  60 tablet 1   metroNIDAZOLE  (METROGEL ) 0.75 % gel Apply 1 Application topically daily as needed (rosacea).     oxymetazoline  (AFRIN) 0.05 % nasal spray Place 1 spray into both nostrils 2 (two) times daily as needed (Nose bleed).     Spacer/Aero-Holding Raguel FRENCH Use as directed 1 each 0   tamsulosin  (FLOMAX ) 0.4 MG CAPS capsule Take 2 capsules (0.8 mg total) by mouth daily. 180 capsule 01   triamcinolone  cream (KENALOG ) 0.1 % Apply 1 Application topically 2 (two) times daily as needed (itching).     No current facility-administered medications for this visit.    Allergies  Bactrim  [sulfamethoxazole -trimethoprim ], Beta adrenergic blockers, Ciprofloxacin , Doxycycline , Lasix  [furosemide ], Dexamethasone , Gabapentin , Methocarbamol, Myrbetriq  [mirabegron ], Neomycin, Penicillins, and Tetracyclines & related  Electrocardiogram:  04/07/18  SR PAC;s otherwise normal 03/02/2024 SR rate 69 normal   Assessment and Plan  CAD:  Distant PCI RCA 1999. Myovue 05/2022 fixed old IMI no ischemia low risk  Clinically stable back on beta blocker see below   AAA: F/U Dr Gretta post endograft repair 07/2023   HTN:  Well controlled.  Continue current medications and low sodium Dash type diet.    GERD:  Continue carafate  and nexium  f/u GI  Asthma/Emphysema :  On inhalers no active wheezing F/U [place]   PAF/SVT:  recent occurrence 01/2024 with conversion cardizem . Patient refused anticoagulation and had epistasis with surgical intervention 02/15/24 Wearing monitor Dr Carlie does not [name] any blood thinner till f/u visit latter this month. Will see in 8 weeks and see what monitor shows   Anxiety/Depression:   Has not started on SSRI   ENT:  post surgical intervention above for epistaxis stents removed  hct 43.2 02/15/24   HLD:  On mevacor  40 mg daily LDL 67 at goal   Hernia:  F/u [place] post surgery 2021 improved   F/U 8 weeks Continue monitor F/U ENT Carlie Coy [facility]

## 2024-02-27 ENCOUNTER — Telehealth: Payer: Self-pay | Admitting: Cardiovascular Disease

## 2024-02-27 ENCOUNTER — Telehealth: Payer: Self-pay | Admitting: *Deleted

## 2024-02-27 NOTE — Telephone Encounter (Signed)
 Patient stated he received heart monitors from [facility] and [employer].  Patient wants to know which device he should use.

## 2024-02-27 NOTE — Telephone Encounter (Signed)
 I spoke with [name].  I had been contacted by Dr. Nishan to set the patient up for a monitor.   I had enrolled the patient with [employer] for a 30 day cardiac event monitor.  Patient has applied the monitor and started it.  He removed it Friday, when he received a monitor from another company, [employer]). Staff from our [place] office may have received the same message to set the patient up with a monitor. [name] had already applied and started the 30 day monitor from [employer].  His enrollment and subsequent charges for a monitor would begin once the monitor was applied and started.  I advised [name] to continue with the [employer] monitor so he would not be charged for two separate event monitors.   I will cancel the [employer].  He can return both monitors in their respective prepaid [employer] return packages once he completes his monitor.  Explained to [name] both monitor companies provide the same type of service.  His final report will be forwarded to Dr. Delford to review.

## 2024-02-27 NOTE — Telephone Encounter (Signed)
 See telephone note

## 2024-02-28 ENCOUNTER — Telehealth: Payer: Self-pay

## 2024-02-28 ENCOUNTER — Ambulatory Visit: Attending: Cardiovascular Disease

## 2024-02-28 DIAGNOSIS — I480 Paroxysmal atrial fibrillation: Secondary | ICD-10-CM

## 2024-02-28 NOTE — Telephone Encounter (Signed)
   Cardiac Monitor Alert  Date of alert:  02/28/2024   Patient Name: [name] [name]  DOB: May 27, 1940  MRN: [id number]   [facility] Cardiologist: Maude Emmer, MD  [facility] EP:  None    Monitor Information: Cardiac Event Monitor [[employer]]  Reason:  Paroxysmal atrial fibrillation Ordering provider:  Maude Emmer, MD   Alert Atrial Fibrillation/Flutter This is the 1st alert for this rhythm.  The patient has a hx of Atrial Fibrillation/Flutter.  The patient is not currently on anticoagulation.  Next Cardiology Appointment   Date: 03/02/24 Provider:  Maude Emmer, MD  Other: Spoke with DOD. DOD advised possible artifact and watchful waiting. Follow up with Nishan as scheduled.   Corean LOISE Ferri, RN  02/28/2024 9:19 AM

## 2024-03-01 DIAGNOSIS — D225 Melanocytic nevi of trunk: Secondary | ICD-10-CM | POA: Diagnosis not present

## 2024-03-01 DIAGNOSIS — L570 Actinic keratosis: Secondary | ICD-10-CM | POA: Diagnosis not present

## 2024-03-01 DIAGNOSIS — X32XXXD Exposure to sunlight, subsequent encounter: Secondary | ICD-10-CM | POA: Diagnosis not present

## 2024-03-01 DIAGNOSIS — Z85828 Personal history of other malignant neoplasm of skin: Secondary | ICD-10-CM | POA: Diagnosis not present

## 2024-03-01 DIAGNOSIS — Z08 Encounter for follow-up examination after completed treatment for malignant neoplasm: Secondary | ICD-10-CM | POA: Diagnosis not present

## 2024-03-01 DIAGNOSIS — Z1283 Encounter for screening for malignant neoplasm of skin: Secondary | ICD-10-CM | POA: Diagnosis not present

## 2024-03-02 ENCOUNTER — Encounter: Payer: Self-pay | Admitting: Cardiovascular Disease

## 2024-03-02 ENCOUNTER — Ambulatory Visit: Attending: Cardiovascular Disease | Admitting: Cardiovascular Disease

## 2024-03-02 ENCOUNTER — Ambulatory Visit

## 2024-03-02 VITALS — BP 150/82 | HR 70 | Ht 73.0 in | Wt 211.2 lb

## 2024-03-02 DIAGNOSIS — E782 Mixed hyperlipidemia: Secondary | ICD-10-CM | POA: Diagnosis not present

## 2024-03-02 DIAGNOSIS — I480 Paroxysmal atrial fibrillation: Secondary | ICD-10-CM | POA: Diagnosis not present

## 2024-03-02 DIAGNOSIS — I714 Abdominal aortic aneurysm, without rupture, unspecified: Secondary | ICD-10-CM | POA: Insufficient documentation

## 2024-03-02 DIAGNOSIS — R079 Chest pain, unspecified: Secondary | ICD-10-CM | POA: Insufficient documentation

## 2024-03-02 NOTE — Patient Instructions (Signed)
 Medication Instructions:   Your physician recommends that you continue on your current medications as directed. Please refer to the Current Medication list given to you today.  Labwork: None today  Testing/Procedures: None today  Follow-Up: Wednesday, December 10 th at 11 am with Dr.Nishan in [place] at [place] office  Any Other Special Instructions Will Be Listed Below (If Applicable).  If you need a refill on your cardiac medications before your next appointment, please call your pharmacy.

## 2024-03-05 DIAGNOSIS — R040 Epistaxis: Secondary | ICD-10-CM | POA: Diagnosis not present

## 2024-03-07 ENCOUNTER — Ambulatory Visit: Admitting: Pulmonary Disease

## 2024-03-08 ENCOUNTER — Encounter: Payer: Self-pay | Admitting: Family Medicine

## 2024-03-08 ENCOUNTER — Ambulatory Visit (INDEPENDENT_AMBULATORY_CARE_PROVIDER_SITE_OTHER): Admitting: Family Medicine

## 2024-03-08 VITALS — BP 122/74 | HR 81 | Temp 98.2°F | Ht 73.0 in | Wt 211.6 lb

## 2024-03-08 DIAGNOSIS — R040 Epistaxis: Secondary | ICD-10-CM | POA: Diagnosis not present

## 2024-03-08 DIAGNOSIS — J449 Chronic obstructive pulmonary disease, unspecified: Secondary | ICD-10-CM

## 2024-03-08 DIAGNOSIS — I480 Paroxysmal atrial fibrillation: Secondary | ICD-10-CM | POA: Diagnosis not present

## 2024-03-08 NOTE — Patient Instructions (Signed)
 Continue your medications.  Follow up in 6 months.  Take care  Dr. Adriana Simas

## 2024-03-11 DIAGNOSIS — I480 Paroxysmal atrial fibrillation: Secondary | ICD-10-CM | POA: Insufficient documentation

## 2024-03-11 MED ORDER — METOPROLOL TARTRATE 25 MG PO TABS: 12.5000 mg | ORAL_TABLET | Freq: Two times a day (BID) | ORAL | 3 refills | Status: DC

## 2024-03-11 NOTE — Assessment & Plan Note (Signed)
 No further nosebleeds since procedure.

## 2024-03-11 NOTE — Progress Notes (Signed)
 Subjective:  Patient ID: [name] [name], male    DOB: 25-May-1940  Age: 83 y.o. MRN: [id number]  CC:   Chief Complaint  Patient presents with   Follow-up    HPI:  83 year old male presents for follow up.  Has seen cardiology and ENT. No further nosebleeds. ENT reports that he can start anticoagulation once he follows up with ENT if no additional nosebleeds. He is currently wearing a monitor regarding SVT/Afib.  He states he is feeling well. No complaints at this time.  Patient Active Problem List   Diagnosis Date Noted   Paroxysmal A-fib (HCC) 03/11/2024   Abdominal pain, chronic, right lower quadrant 02/02/2024   Lower urinary tract symptoms (LUTS) 01/22/2024   Lower extremity edema 12/15/2023   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   DOE (dyspnea on exertion) 10/20/2023   Cervical radiculopathy 08/15/2023   BPH associated with nocturia 05/24/2023   COPD GOLD 3/ AB 05/05/2023   Celiac artery stenosis 02/02/2023   Chronic idiopathic constipation 12/21/2022   Bilateral primary osteoarthritis of knee 04/14/2022   Irritable bowel syndrome 03/17/2022   Seasonal allergies 08/06/2021   BPH (benign prostatic hyperplasia) 04/23/2021   CAD (coronary artery disease) 04/23/2021   Epistaxis 04/23/2021   Essential hypertension 09/16/2016   Lumbar spondylosis 11/05/2014   GERD (gastroesophageal reflux disease) 01/19/2012   Mixed hyperlipidemia 04/15/2008   AAA (abdominal aortic aneurysm) 04/15/2008    Social Hx   Social History   Socioeconomic History   Marital status: Widowed    Spouse name: Not on file   Number of children: Not on file   Years of education: Not on file   Highest education level: Not on file  Occupational History   Not on file  Tobacco Use   Smoking status: Former    Current packs/day: 0.00    Average packs/day: 1 pack/day for 35.0 years (35.0 ttl pk-yrs)    Types: Cigarettes    Start date: 03/27/1968    Quit date: 03/28/2003    Years  since quitting: 20.9   Smokeless tobacco: Never  Vaping Use   Vaping status: Never Used  Substance and Sexual Activity   Alcohol use: No    Alcohol/week: 0.0 standard drinks of alcohol   Drug use: No   Sexual activity: Not Currently    Birth control/protection: None  Other Topics Concern   Not on file  Social History Narrative   Not on file   Social Drivers of Health   Financial Resource Strain: Low Risk  (03/18/2022)   Overall Financial Resource Strain (CARDIA)    Difficulty of Paying Living Expenses: Not very hard  Food Insecurity: Low Risk  (03/05/2024)   Received from [facility]   Hunger Vital Sign    Within the past 12 months, you worried that your food would run out before you got money to buy more: Never true    Within the past 12 months, the food you bought just didn't last and you didn't have money to get more. : Never true  Transportation Needs: No Transportation Needs (03/05/2024)   Received from [facility]    In the past 12 months, has lack of reliable transportation kept you from medical appointments, meetings, work or from getting things needed for daily living? : No  Physical Activity: Insufficiently Active (03/18/2022)   Exercise Vital Sign    Days of Exercise per Week: 2 days    Minutes of Exercise per Session: 20 min  Stress: No Stress Concern Present (03/18/2022)   [employer] of Occupational Health - Occupational Stress Questionnaire    Feeling of Stress : Not at all  Social Connections: Socially Isolated (02/05/2024)   Social Connection and Isolation Panel    Frequency of Communication with Friends and Family: More than three times a week    Frequency of Social Gatherings with Friends and Family: Three times a week    Attends Religious Services: Never    Active Member of Clubs or Organizations: No    Attends Banker Meetings: Never    Marital Status: Widowed    Review of Systems Per HPI  Objective:  BP 122/74    Pulse 81   Temp 98.2 F (36.8 C) (Temporal)   Ht 6' 1 (1.854 m)   Wt 211 lb 9.6 oz (96 kg)   SpO2 100%   BMI 27.92 kg/m      03/08/2024    1:18 PM 03/02/2024    1:20 PM 02/15/2024   10:30 AM  BP/Weight  Systolic BP 122 150 137  Diastolic BP 74 82 78  Wt. (Lbs) 211.6 211.2   BMI 27.92 kg/m2 27.86 kg/m2     Physical Exam Vitals and nursing note reviewed.  Constitutional:      General: He is not in acute distress.    Appearance: Normal appearance.  HENT:     Head: Normocephalic and atraumatic.  Cardiovascular:     Rate and Rhythm: Normal rate and regular rhythm.  Pulmonary:     Effort: Pulmonary effort is normal.     Breath sounds: No wheezing or rales.  Neurological:     Mental Status: He is alert.     Lab Results  Component Value Date   WBC 8.0 02/15/2024   HGB 13.9 02/15/2024   HCT 43.2 02/15/2024   PLT 186 02/15/2024   GLUCOSE 111 (H) 02/15/2024   CHOL 134 08/30/2023   TRIG 115 08/30/2023   HDL 45 08/30/2023   LDLCALC 68 08/30/2023   ALT 20 02/05/2024   AST 24 02/05/2024   NA 136 02/15/2024   K 3.7 02/15/2024   CL 101 02/15/2024   CREATININE 0.93 02/15/2024   BUN 9 02/15/2024   CO2 28 02/15/2024   TSH 1.449 02/04/2024   PSA 1.80 07/26/2014   INR 1.0 02/04/2024   HGBA1C 6.0 (H) 08/30/2023     Assessment & Plan:  Paroxysmal A-fib (HCC) Assessment & Plan: In sinus rhythm currently. Defer anticoagulation to cardiology.  Continue Lopressor .  Orders: -     Metoprolol  Tartrate; Take 0.5 tablets (12.5 mg total) by mouth 2 (two) times daily.  Dispense: 180 tablet; Refill: 3  COPD GOLD 3/ AB Assessment & Plan: Stable. Currently.   Epistaxis Assessment & Plan: No further nosebleeds since procedure.    Follow-up:  6 months  Lyriq Finerty Bluford DO [facility]

## 2024-03-11 NOTE — Assessment & Plan Note (Signed)
 In sinus rhythm currently. Defer anticoagulation to cardiology.  Continue Lopressor .

## 2024-03-11 NOTE — Assessment & Plan Note (Signed)
 Stable. Currently.

## 2024-03-14 ENCOUNTER — Other Ambulatory Visit: Payer: Self-pay

## 2024-03-14 ENCOUNTER — Emergency Department (HOSPITAL_COMMUNITY)
Admission: EM | Admit: 2024-03-14 | Discharge: 2024-03-14 | Disposition: A | Discharge status: Home / Self Care | Attending: Emergency Medicine | Admitting: Emergency Medicine

## 2024-03-14 ENCOUNTER — Encounter (HOSPITAL_COMMUNITY): Payer: Self-pay

## 2024-03-14 ENCOUNTER — Emergency Department (HOSPITAL_COMMUNITY)

## 2024-03-14 DIAGNOSIS — R079 Chest pain, unspecified: Secondary | ICD-10-CM | POA: Diagnosis not present

## 2024-03-14 DIAGNOSIS — R9389 Abnormal findings on diagnostic imaging of other specified body structures: Secondary | ICD-10-CM | POA: Diagnosis not present

## 2024-03-14 DIAGNOSIS — I2510 Atherosclerotic heart disease of native coronary artery without angina pectoris: Secondary | ICD-10-CM | POA: Diagnosis not present

## 2024-03-14 DIAGNOSIS — Z7951 Long term (current) use of inhaled steroids: Secondary | ICD-10-CM | POA: Diagnosis not present

## 2024-03-14 DIAGNOSIS — R197 Diarrhea, unspecified: Secondary | ICD-10-CM | POA: Diagnosis not present

## 2024-03-14 DIAGNOSIS — Z7982 Long term (current) use of aspirin: Secondary | ICD-10-CM | POA: Diagnosis not present

## 2024-03-14 DIAGNOSIS — R42 Dizziness and giddiness: Secondary | ICD-10-CM | POA: Insufficient documentation

## 2024-03-14 DIAGNOSIS — Z87891 Personal history of nicotine dependence: Secondary | ICD-10-CM | POA: Diagnosis not present

## 2024-03-14 DIAGNOSIS — I10 Essential (primary) hypertension: Secondary | ICD-10-CM | POA: Insufficient documentation

## 2024-03-14 DIAGNOSIS — Z85828 Personal history of other malignant neoplasm of skin: Secondary | ICD-10-CM | POA: Insufficient documentation

## 2024-03-14 DIAGNOSIS — J449 Chronic obstructive pulmonary disease, unspecified: Secondary | ICD-10-CM | POA: Diagnosis not present

## 2024-03-14 DIAGNOSIS — J9811 Atelectasis: Secondary | ICD-10-CM | POA: Diagnosis not present

## 2024-03-14 DIAGNOSIS — J42 Unspecified chronic bronchitis: Secondary | ICD-10-CM | POA: Diagnosis not present

## 2024-03-14 DIAGNOSIS — Z79899 Other long term (current) drug therapy: Secondary | ICD-10-CM | POA: Insufficient documentation

## 2024-03-14 LAB — COMPREHENSIVE METABOLIC PANEL WITH GFR
ALT: 17 U/L (ref 0–44)
AST: 24 U/L (ref 15–41)
Albumin: 4.1 g/dL (ref 3.5–5.0)
Alkaline Phosphatase: 75 U/L (ref 38–126)
Anion gap: 8 (ref 5–15)
BUN: 9 mg/dL (ref 8–23)
CO2: 29 mmol/L (ref 22–32)
Calcium: 9 mg/dL (ref 8.9–10.3)
Chloride: 103 mmol/L (ref 98–111)
Creatinine, Ser: 0.84 mg/dL (ref 0.61–1.24)
GFR, Estimated: >60 mL/min (ref >60)
Glucose, Bld: 131 mg/dL — ABNORMAL HIGH (ref 70–99)
Potassium: 3.8 mmol/L (ref 3.5–5.1)
Sodium: 140 mmol/L (ref 135–145)
Total Bilirubin: 0.5 mg/dL (ref 0.0–1.2)
Total Protein: 7.1 g/dL (ref 6.5–8.1)

## 2024-03-14 LAB — CBC WITH DIFFERENTIAL/PLATELET
Abs Immature Granulocytes: 0.02 K/uL (ref 0.00–0.07)
Basophils Absolute: 0.1 K/uL (ref 0.0–0.1)
Basophils Relative: 1 %
Eosinophils Absolute: 0.4 K/uL (ref 0.0–0.5)
Eosinophils Relative: 5 %
HCT: 42.8 % (ref 39.0–52.0)
Hemoglobin: 13.5 g/dL (ref 13.0–17.0)
Immature Granulocytes: 0 %
Lymphocytes Relative: 19 %
Lymphs Abs: 1.7 K/uL (ref 0.7–4.0)
MCH: 28.6 pg (ref 26.0–34.0)
MCHC: 31.5 g/dL (ref 30.0–36.0)
MCV: 90.7 fL (ref 80.0–100.0)
Monocytes Absolute: 0.7 K/uL (ref 0.1–1.0)
Monocytes Relative: 8 %
Neutro Abs: 5.6 K/uL (ref 1.7–7.7)
Neutrophils Relative %: 67 %
Platelets: 190 K/uL (ref 150–400)
RBC: 4.72 MIL/uL (ref 4.22–5.81)
RDW: 13.5 % (ref 11.5–15.5)
WBC: 8.5 K/uL (ref 4.0–10.5)
nRBC: 0 % (ref 0.0–0.2)

## 2024-03-14 LAB — TROPONIN T, HIGH SENSITIVITY
Troponin T High Sensitivity: 19 ng/L (ref 0–19)
Troponin T High Sensitivity: 19 ng/L (ref 0–19)

## 2024-03-14 NOTE — ED Triage Notes (Signed)
 Pt arrived via POV c/o nausea, dizziness and reports recently being seen here for atrial fibrillation. Pt denies new Sob, denies Chest Pain.

## 2024-03-14 NOTE — Discharge Instructions (Signed)
 We evaluated you for your episode of lightheadedness and diarrhea.  Your testing in the emergency department is reassuring.  Your EKG shows that you are in a normal rhythm.  Follow-up closely with your primary doctor.  Please make sure you are drinking lots of fluids.  If needed, you can take Imodium for your loose stool.  Please take 2 mg after every loose stool up to 8 mg total per day.  Only take this as needed because it can also cause constipation.  Please return to the emergency department if you develop any new or worsening symptoms such as chest pain, shortness of breath, blood in your stool, abdominal pain, or any other new symptoms.

## 2024-03-14 NOTE — ED Provider Notes (Signed)
 [place] EMERGENCY DEPARTMENT AT [facility] Provider Note  CSN: [id number] Arrival date & time: 03/14/24 8151  Chief Complaint(s) Atrial Fibrillation  HPI [name] [name] is a 83 y.o. male history of atrial fibrillation not currently on anticoagulation, BPH, coronary disease, COPD, GERD, hypertension, hyperlipidemia presenting to the emergency department with episode of lightheadedness.  Patient for the past few days has been having diarrhea, nausea.  Reports today had episode when he was getting up where he felt lightheaded, denies loss of consciousness or near syncope.  Symptoms resolved.  Was recently treated for A-fib so he was concerned that this could be related to A-fib.  Denies any palpitations.  Denies any chest pain or shortness of breath.  Denies any abdominal pain.  Denies any fevers or chills.  Symptoms mild.   Past Medical History Past Medical History:  Diagnosis Date   AAA (abdominal aortic aneurysm)    needs yearly ultrasound   Allergy    Anemia    Arthritis    Asthma    BCC (basal cell carcinoma) 08/18/1989   left shoulder blad, upper right arm, left arm beyond elbow, c&d   BCC (basal cell carcinoma) 01/31/1992   Posterior neck, curetx3, 53fu   BCC (basal cell carcinoma) 11/22/2001   mid forehead, cx3, excision, right forearm, cx3, 33fu   BCC (basal cell carcinoma) 10/09/2003   mid forehead, MOHs   BCC (basal cell carcinoma) 08/15/2008   upper left back, biopsy   BPH (benign prostatic hyperplasia)    CAD (coronary artery disease)    Cancer (HCC)    skin cancer   COPD (chronic obstructive pulmonary disease) (HCC)    Dysrhythmia    pt. states it can be fast at times   GERD (gastroesophageal reflux disease)    Glaucoma    History of acute pancreatitis 12/21/2022   HOH (hard of hearing)    Hypercholesterolemia    Hypertension    Impaired fasting glucose    Low back pain    Melanoma in situ (HCC) 10/09/2003   left chin, MOHs   MI (myocardial  infarction) (HCC) 1999   Peripheral vascular disease    AAA   SCC (squamous cell carcinoma) 07/03/2014   in situ, behind left ear, cx3, cautery, 74fu   SCC (squamous cell carcinoma) 07/03/2014   well diff, left forearm, biopsy, cx1, cautery   SCC (squamous cell carcinoma) 07/20/2017   in situ, left upper arm, cx3, 87fu   SCC (squamous cell carcinoma) 01/10/2019   in situ, left post shoulder, cx3, 20fu   SCC (squamous cell carcinoma) 11/22/2001   left forearm distal, left forearm, cx3, 36fu   SCC (squamous cell carcinoma) 10/09/2003   Bowens, left ear post, clear per st, right cheek clear   SCC (squamous cell carcinoma) 03/30/2004   in situ, left upper arm, cx3, 45fu   SCC (squamous cell carcinoma) 03/08/2005   in situ, right cheek, mid upper forehead, cx3, 31fu   SCC (squamous cell carcinoma) 06/08/2006   in situ, left shoulder, cx3, 21fu   SCC (squamous cell carcinoma) 05/05/2010   right inner wrist, biopsy   SCC (squamous cell carcinoma) 09/13/2013   in situ, right crown scalp, front scalp, biopsy   Thrush    Patient Active Problem List   Diagnosis Date Noted   Paroxysmal A-fib (HCC) 03/11/2024   Abdominal pain, chronic, right lower quadrant 02/02/2024   Lower urinary tract symptoms (LUTS) 01/22/2024   Lower extremity edema 12/15/2023   Hemidiaphragmatic eventration L  anterior 10/22/2023   DOE (dyspnea on exertion) 10/20/2023   Cervical radiculopathy 08/15/2023   BPH associated with nocturia 05/24/2023   COPD GOLD 3/ AB 05/05/2023   Celiac artery stenosis 02/02/2023   Chronic idiopathic constipation 12/21/2022   Bilateral primary osteoarthritis of knee 04/14/2022   Irritable bowel syndrome 03/17/2022   Seasonal allergies 08/06/2021   BPH (benign prostatic hyperplasia) 04/23/2021   CAD (coronary artery disease) 04/23/2021   Epistaxis 04/23/2021   Essential hypertension 09/16/2016   Lumbar spondylosis 11/05/2014   GERD (gastroesophageal reflux disease) 01/19/2012   Mixed  hyperlipidemia 04/15/2008   AAA (abdominal aortic aneurysm) 04/15/2008   Home Medication(s) Prior to Admission medications   Medication Sig Start Date End Date Taking? Authorizing Provider  albuterol  (PROVENTIL ) (2.5 MG/3ML) 0.083% nebulizer solution Take 3 mLs (2.5 mg total) by nebulization every 6 (six) hours as needed for wheezing or shortness of breath. 07/26/23   Darlean Ozell NOVAK, MD  albuterol  (VENTOLIN  HFA) 108 (90 Base) MCG/ACT inhaler Inhale 2 puffs into the lungs every 4 (four) hours as needed for wheezing or shortness of breath. 03/19/23   Ula Prentice SAUNDERS, MD  aspirin  EC 81 MG tablet Take 1 tablet (81 mg total) by mouth daily at 6 (six) AM. Swallow whole. 07/31/23   Rhyne, Lucie PARAS, PA-C  cetirizine (ZYRTEC) 10 MG tablet Take 10 mg by mouth daily.    [provider]  esomeprazole  (NEXIUM ) 40 MG capsule Take 30-60 min before first meal of the day 09/22/23   Darlean Ozell NOVAK, MD  famotidine  (PEPCID ) 20 MG tablet One after supper Patient taking differently: Take 20 mg by mouth daily before supper. One after supper 09/22/23   Darlean Ozell NOVAK, MD  fluticasone -salmeterol (ADVAIR  HFA) 230-21 MCG/ACT inhaler Take 2 puffs first thing in am and then another 2 puffs about 12 hours later. 10/20/23   Darlean Ozell NOVAK, MD  latanoprost  (XALATAN ) 0.005 % ophthalmic solution Place 1 drop into both eyes at bedtime.    [provider]  LORazepam  (ATIVAN ) 0.5 MG tablet Take 1 tablet (0.5 mg total) by mouth at bedtime as needed. Patient taking differently: Take 0.5 mg by mouth at bedtime. 02/01/24   Cook, Jayce G, DO  lovastatin  (MEVACOR ) 40 MG tablet Take 1 tablet (40 mg total) by mouth daily. 12/30/23   Cook, Jayce G, DO  metoprolol  tartrate (LOPRESSOR ) 25 MG tablet Take 0.5 tablets (12.5 mg total) by mouth 2 (two) times daily. 03/11/24   Cook, Jayce G, DO  metroNIDAZOLE  (METROGEL ) 0.75 % gel Apply 1 Application topically daily as needed (rosacea). 06/20/23   [provider]  oxymetazoline   (AFRIN) 0.05 % nasal spray Place 1 spray into both nostrils 2 (two) times daily as needed (Nose bleed).    [provider]  Spacer/Aero-Holding Raguel FRENCH Use as directed 02/17/22   Neda Jennet LABOR, MD  tamsulosin  (FLOMAX ) 0.4 MG CAPS capsule Take 2 capsules (0.8 mg total) by mouth daily. 01/20/24   Cook, Jayce G, DO  triamcinolone  cream (KENALOG ) 0.1 % Apply 1 Application topically 2 (two) times daily as needed (itching). 04/21/23   [provider]  Past Surgical History Past Surgical History:  Procedure Laterality Date   ABDOMINAL AORTIC ENDOVASCULAR STENT GRAFT N/A 07/29/2023   Procedure: INSERTION, ENDOVASCULAR STENT GRAFT, AORTA, ABDOMINAL;  Surgeon: Gretta Lonni PARAS, MD;  Location: [facility] OR;  Service: Vascular;  Laterality: N/A;   BACK SURGERY     x 3   CARDIAC CATHETERIZATION     angioplasty   CATARACT EXTRACTION W/PHACO  03/20/2012   Procedure: CATARACT EXTRACTION PHACO AND INTRAOCULAR LENS PLACEMENT (IOC);  Surgeon: Dow JULIANNA Burke, MD;  Location: [facility] ORS;  Service: Ophthalmology;  Laterality: Right;  CDE:  8.45   CATARACT EXTRACTION W/PHACO Left 04/02/2013   Procedure: CATARACT EXTRACTION PHACO AND INTRAOCULAR LENS PLACEMENT (IOC);  Surgeon: Dow JULIANNA Burke, MD;  Location: [facility]S;  Service: Ophthalmology;  Laterality: Left;  CDE:  6.50   CHOLECYSTECTOMY  2000   COLONOSCOPY  2009   repeat 5 years   ESOPHAGOGASTRODUODENOSCOPY     HERNIA REPAIR Left    inguinal   INGUINAL HERNIA REPAIR Right 03/28/2020   Procedure: Right Inguinal Herniorrhaphy with Mesh;  Surgeon: Mavis Anes, MD;  Location: [facility];  Service: General;  Laterality: Right;   LAPAROSCOPIC PARTIAL COLECTOMY N/A 06/11/2013   Procedure: LAPAROSCOPIC HAND ASSISTED PARTIAL COLECTOMY;  Surgeon: Anes DELENA Mavis, MD;  Location: [facility];  Service: General;  Laterality: N/A;    NASAL ENDOSCOPY WITH EPISTAXIS CONTROL Bilateral 02/11/2020   Procedure: NASAL ENDOSCOPY WITH EPISTAXIS CONTROL;  Surgeon: Karis Clunes, MD;  Location: [facility];  Service: ENT;  Laterality: Bilateral;   NASAL ENDOSCOPY WITH EPISTAXIS CONTROL Right 02/15/2024   Procedure: CONTROL OF EPISTAXIS RIGHT SIDE;  Surgeon: Carlie Clark, MD;  Location: [facility] OR;  Service: ENT;  Laterality: Right;   NASAL SINUS SURGERY Bilateral 02/15/2024   Procedure: BILATERAL NASAL ENDOSCOPY W/ CAUTERY AND LEFT SPHENOPALATINE CLIPPING;  Surgeon: Carlie Clark, MD;  Location: [facility] OR;  Service: ENT;  Laterality: Bilateral;  BILATERAL NASAL ENDOSCOPY W/ CAUTERY AND LEFT SPHENOPALATINE CLIPPING   REPAIR ILIAC ARTERY Right 07/29/2023   Procedure: REPAIR, ARTERY, FEMORAL;  Surgeon: Gretta Lonni PARAS, MD;  Location: [facility] OR;  Service: Vascular;  Laterality: Right;   right eye detached retina Bilateral    SEPTOPLASTY Left 02/15/2024   Procedure: SEPTOPLASTY, NOSE;  Surgeon: Carlie Clark, MD;  Location: [facility] OR;  Service: ENT;  Laterality: Left;   SPINAL FUSION  2016   ULTRASOUND GUIDANCE FOR VASCULAR ACCESS Bilateral 07/29/2023   Procedure: ULTRASOUND GUIDANCE, FOR VASCULAR ACCESS;  Surgeon: Gretta Lonni PARAS, MD;  Location: [facility] OR;  Service: Vascular;  Laterality: Bilateral;   YAG LASER APPLICATION Left 05/06/2014   Procedure: YAG LASER APPLICATION;  Surgeon: Dow JULIANNA Burke, MD;  Location: [facility];  Service: Ophthalmology;  Laterality: Left;   Family History Family History  Problem Relation Age of Onset   Hypertension Mother    COPD Father    Cancer Brother        brain    Social History Social History   Tobacco Use   Smoking status: Former    Current packs/day: 0.00    Average packs/day: 1 pack/day for 35.0 years (35.0 ttl pk-yrs)    Types: Cigarettes    Start date: 03/27/1968    Quit date: 03/28/2003    Years since quitting: 20.9   Smokeless tobacco: Never  Vaping Use   Vaping status: Never Used   Substance Use Topics   Alcohol use: No    Alcohol/week: 0.0 standard drinks of alcohol   Drug use:  No   Allergies Bactrim  [sulfamethoxazole -trimethoprim ], Beta adrenergic blockers, Ciprofloxacin , Doxycycline , Lasix  [furosemide ], Dexamethasone , Gabapentin , Methocarbamol, Myrbetriq  [mirabegron ], Neomycin, Penicillins, and Tetracyclines & related  Review of Systems Review of Systems  All other systems reviewed and are negative.   Physical Exam Vital Signs  I have reviewed the triage vital signs BP (!) 155/69   Pulse 80   Temp 97.8 F (36.6 C) (Oral)   Resp 13   Ht 6' 1 (1.854 m)   Wt 96 kg   SpO2 93%   BMI 27.92 kg/m  Physical Exam Vitals and nursing note reviewed.  Constitutional:      General: He is not in acute distress.    Appearance: Normal appearance.  HENT:     Mouth/Throat:     Mouth: Mucous membranes are moist.  Eyes:     Conjunctiva/sclera: Conjunctivae normal.  Cardiovascular:     Rate and Rhythm: Normal rate and regular rhythm.  Pulmonary:     Effort: Pulmonary effort is normal. No respiratory distress.     Breath sounds: Normal breath sounds.  Abdominal:     General: Abdomen is flat.     Palpations: Abdomen is soft.     Tenderness: There is no abdominal tenderness.  Musculoskeletal:     Right lower leg: No edema.     Left lower leg: No edema.  Skin:    General: Skin is warm and dry.     Capillary Refill: Capillary refill takes less than 2 seconds.  Neurological:     Mental Status: He is alert and oriented to person, place, and time. Mental status is at baseline.  Psychiatric:        Mood and Affect: Mood normal.        Behavior: Behavior normal.     ED Results and Treatments Labs (all labs ordered are listed, but only abnormal results are displayed) Labs Reviewed  COMPREHENSIVE METABOLIC PANEL WITH GFR - Abnormal; Notable for the following components:      Result Value   Glucose, Bld 131 (*)    All other components within normal limits   CBC WITH DIFFERENTIAL/PLATELET  TROPONIN T, HIGH SENSITIVITY  TROPONIN T, HIGH SENSITIVITY                                                                                                                          Radiology DG Chest Port 1 View Result Date: 03/14/2024 CLINICAL DATA:  Chest pain EXAM: PORTABLE CHEST 1 VIEW COMPARISON:  02/04/2024 FINDINGS: Chronic elevation of left diaphragm. Subsegmental atelectasis at the left base. Diffuse bronchitic changes, chronic. Stable cardiomediastinal silhouette. No pleural effusion or pneumothorax IMPRESSION: Chronic elevation of left diaphragm with subsegmental atelectasis at the left base. Diffuse chronic bronchitic changes without superimposed acute airspace disease. Electronically Signed   By: Luke Bun M.D.   On: 03/14/2024 19:25    Pertinent labs & imaging results that were available during my care of the patient were reviewed by me and considered  in my medical decision making (see MDM for details).  Medications Ordered in ED Medications - No data to display                                                                                                                                   Procedures Procedures  (including critical care time)  Medical Decision Making / ED Course   MDM:  83 year old presenting to the emergency department after episode of lightheadedness.  Patient overall well-appearing, physical examination without focal finding.  Abdomen is soft and nontender.  Heart rate is regular.  Suspect possible mild dehydration.  Patient reports diarrheal illness symptoms.  Denies any blood in his stool or abdominal pain.  Abdomen is benign.  Low concern for dangerous cause of diarrhea such as C. difficile or invasive diarrhea.  No blood in the stool or mucus in the stool.  He reports that symptoms in the emergency department have resolved.  Patient was concerned about possible A-fib however on monitor and on EKG patient is in  normal sinus rhythm.  Triage obtain testing including troponin which is negative x 2.  Patient also not having any chest pain or symptoms suggestive of ACS.  Laboratory testing also otherwise reassuring without evidence of underlying toxic metabolic process, electrolyte derangement, dehydration.  At this time, patient feels asymptomatic, feels ready to go home.  Patient stable for discharge. Will discharge patient to home. All questions answered. Patient comfortable with plan of discharge. Return precautions discussed with patient and specified on the after visit summary.         Additional history obtained: -Additional history obtained from family -External records from outside source obtained and reviewed including: Chart review including previous notes, labs, imaging, consultation notes including prior notes    Lab Tests: -I ordered, reviewed, and interpreted labs.   The pertinent results include:   Labs Reviewed  COMPREHENSIVE METABOLIC PANEL WITH GFR - Abnormal; Notable for the following components:      Result Value   Glucose, Bld 131 (*)    All other components within normal limits  CBC WITH DIFFERENTIAL/PLATELET  TROPONIN T, HIGH SENSITIVITY  TROPONIN T, HIGH SENSITIVITY    Notable for normal troponin  EKG   EKG Interpretation Date/Time:  Wednesday March 14 2024 18:56:33 EDT Ventricular Rate:  93 PR Interval:  148 QRS Duration:  88 QT Interval:  370 QTC Calculation: 460 R Axis:   54  Text Interpretation: Sinus rhythm with Premature supraventricular complexes Nonspecific ST and T wave abnormality Prolonged QT No significant change since last tracing Confirmed by Francesca Fallow ([id number]) on 03/14/2024 11:08:49 PM         Imaging Studies ordered: I ordered imaging studies including CXR On my interpretation imaging demonstrates no acute process I independently visualized and interpreted imaging. I agree with the radiologist interpretation   Medicines ordered  and prescription drug management: No orders of the defined types were  placed in this encounter.   -I have reviewed the patients home medicines and have made adjustments as needed   Co morbidities that complicate the patient evaluation  Past Medical History:  Diagnosis Date   AAA (abdominal aortic aneurysm)    needs yearly ultrasound   Allergy    Anemia    Arthritis    Asthma    BCC (basal cell carcinoma) 08/18/1989   left shoulder blad, upper right arm, left arm beyond elbow, c&d   BCC (basal cell carcinoma) 01/31/1992   Posterior neck, curetx3, 68fu   BCC (basal cell carcinoma) 11/22/2001   mid forehead, cx3, excision, right forearm, cx3, 65fu   BCC (basal cell carcinoma) 10/09/2003   mid forehead, MOHs   BCC (basal cell carcinoma) 08/15/2008   upper left back, biopsy   BPH (benign prostatic hyperplasia)    CAD (coronary artery disease)    Cancer (HCC)    skin cancer   COPD (chronic obstructive pulmonary disease) (HCC)    Dysrhythmia    pt. states it can be fast at times   GERD (gastroesophageal reflux disease)    Glaucoma    History of acute pancreatitis 12/21/2022   HOH (hard of hearing)    Hypercholesterolemia    Hypertension    Impaired fasting glucose    Low back pain    Melanoma in situ (HCC) 10/09/2003   left chin, MOHs   MI (myocardial infarction) (HCC) 1999   Peripheral vascular disease    AAA   SCC (squamous cell carcinoma) 07/03/2014   in situ, behind left ear, cx3, cautery, 81fu   SCC (squamous cell carcinoma) 07/03/2014   well diff, left forearm, biopsy, cx1, cautery   SCC (squamous cell carcinoma) 07/20/2017   in situ, left upper arm, cx3, 57fu   SCC (squamous cell carcinoma) 01/10/2019   in situ, left post shoulder, cx3, 88fu   SCC (squamous cell carcinoma) 11/22/2001   left forearm distal, left forearm, cx3, 71fu   SCC (squamous cell carcinoma) 10/09/2003   Bowens, left ear post, clear per st, right cheek clear   SCC (squamous cell carcinoma)  03/30/2004   in situ, left upper arm, cx3, 36fu   SCC (squamous cell carcinoma) 03/08/2005   in situ, right cheek, mid upper forehead, cx3, 45fu   SCC (squamous cell carcinoma) 06/08/2006   in situ, left shoulder, cx3, 5fu   SCC (squamous cell carcinoma) 05/05/2010   right inner wrist, biopsy   SCC (squamous cell carcinoma) 09/13/2013   in situ, right crown scalp, front scalp, biopsy   Thrush       Dispostion: Disposition decision including need for hospitalization was considered, and patient discharged from emergency department.    Final Clinical Impression(s) / ED Diagnoses Final diagnoses:  Lightheadedness  Diarrhea, unspecified type     This chart was dictated using voice recognition software.  Despite best efforts to proofread,  errors can occur which can change the documentation meaning.    Francesca Elsie CROME, MD 03/14/24 2312

## 2024-03-16 ENCOUNTER — Ambulatory Visit: Payer: Self-pay | Admitting: Cardiovascular Disease

## 2024-03-19 ENCOUNTER — Encounter: Payer: Self-pay | Admitting: Radiology

## 2024-03-20 ENCOUNTER — Ambulatory Visit (INDEPENDENT_AMBULATORY_CARE_PROVIDER_SITE_OTHER): Admitting: Orthopedic Surgery

## 2024-03-20 ENCOUNTER — Encounter: Payer: Self-pay | Admitting: Orthopedic Surgery

## 2024-03-20 DIAGNOSIS — M1711 Unilateral primary osteoarthritis, right knee: Secondary | ICD-10-CM

## 2024-03-20 NOTE — Patient Instructions (Signed)

## 2024-03-20 NOTE — Progress Notes (Signed)
 Return patient Visit  Assessment: [name] [name] is a 83 y.o. male with the following: 1. Arthritis of right knee  Plan: [name] [name] has advanced degenerative changes in the right knee.  Injections have been successful.  He would like to proceed with another injection.  This was completed clinic today.  He will follow-up as needed.  Procedure note injection Right knee joint   Verbal consent was obtained to inject the right knee joint  Timeout was completed to confirm the site of injection.  The skin was prepped with alcohol and ethyl chloride was sprayed at the injection site.  A 21-gauge needle was used to inject 40 mg of Depo-Medrol  and 1% lidocaine  (3 cc) into the right knee using an anterolateral approach.  There were no complications. A sterile bandage was applied.   Follow-up: Return if symptoms worsen or fail to improve.  Subjective:  Chief Complaint  Patient presents with   Knee Pain    Right wants injection    History of Present Illness: [name] [name] is a 83 y.o. male who returns for evaluation of right knee pain.  I have seen him several times for right knee pain.  He has advanced degenerative changes.  Injections continue to help.  He would like another injection today.   Review of Systems: No fevers or chills No numbness or tingling No chest pain No shortness of breath No bowel or bladder dysfunction No GI distress No headaches   Objective: There were no vitals taken for this visit.  Physical Exam:  General: Elderly male., Alert and oriented., and No acute distress. Gait: Right sided antalgic gait.  Right knee with mild varus alignment overall.  Tenderness to palpation over the medial joint line.  No effusion.  Range of motion from 3-120 degrees without discomfort.  No increased laxity varus or valgus stress.  Negative Lachman.  Toes are warm and well-perfused.  IMAGING: No new imaging obtained today   New Medications:  No orders of the defined  types were placed in this encounter.     [name] DELENA Horde, MD  03/20/2024 2:13 PM

## 2024-03-23 ENCOUNTER — Ambulatory Visit: Payer: Self-pay | Admitting: Pulmonary Disease

## 2024-03-23 NOTE — Progress Notes (Signed)
 Send a message on mychart to patient

## 2024-03-23 NOTE — Telephone Encounter (Signed)
 Routing to [employer] Doc per protocol

## 2024-03-23 NOTE — Telephone Encounter (Signed)
 FYI Only or Action Required?: Action required by provider: requesting abx to [facility].  Patient is followed in Pulmonology for COPD, last seen on 01/11/2024 by Neda Jennet LABOR, MD.  Called Nurse Triage reporting Cough.  Symptoms began several days ago.  Interventions attempted: OTC medications: alkaseltzer, robitussin, Maintenance inhaler, and Nebulizer treatments.  Symptoms are: unchanged.  Triage Disposition: See Physician Within 24 Hours  Patient/caregiver understands and will follow disposition?: No, wishes to speak with PCP         Copied from CRM #[id number]. Topic: Clinical - Red Word Triage >> Mar 23, 2024  1:21 PM Whitney O wrote: [facility] that prompted transfer to Nurse Triage: started feeling real congested coughing up dark colored flem  Patient wants dr o to call in a zpack Reason for Disposition  [1] Known COPD or other severe lung disease (i.e., bronchiectasis, cystic fibrosis, lung surgery) AND [2] symptoms getting worse (i.e., increased sputum purulence or amount, increased breathing difficulty    Pt requesting [facility]-pack from [facility]. [facility] advised that [facility] has early closures on Friday but will attempt to accommodate request.  Triager will forward encounter for Dr Neda 's office to review and advise. Patient verbalized understanding and is expecting call back from office for next steps. [facility] also advised that if pt does not hear back from office, to follow disposition for further evaluation/treatment either at PCP or UC over the weekend.  Answer Assessment - Initial Assessment Questions E2C2 Pulmonary Triage - Initial Assessment Questions Chief Complaint (e.g., cough, sob, wheezing, fever, chills, sweat or additional symptoms) *Go to specific symptom protocol after initial questions. Productive dark cough - requesting zpack  How long have symptoms been present? A few days  Have you tested for COVID or Flu? Note: If not, ask patient if a home  test can be taken. If so, instruct patient to call back for positive results. No  MEDICINES:   Have you used any OTC meds to help with symptoms? Yes If yes, ask What medications? Alkaselzer robitussin  Have you used your inhalers/maintenance medication? Yes If yes, What medications? Albuterol  neb - uses 2-3x a day [facility] reviewed/reinforced albuterol  usage and SIG.   fluticasone -salmeterol (ADVAIR  HFA) - as prescribed   If inhaler, ask How many puffs and how often? Note: Review instructions on medication in the chart. See above  OXYGEN: Do you wear supplemental oxygen? No If yes, How many liters are you supposed to use? N/a  Do you monitor your oxygen levels? Yes If yes, What is your reading (oxygen level) today? 96  What is your usual oxygen saturation reading?  (Note: Pulmonary O2 sats should be 90% or greater) High 90s          1. ONSET: When did the cough begin?      See above 2. SEVERITY: How bad is the cough today?      worsening 3. SPUTUM: Describe the color of your sputum (e.g., none, dry cough; clear, white, yellow, green)     See above 4. HEMOPTYSIS: Are you coughing up any blood? If Yes, ask: How much? (e.g., flecks, streaks, tablespoons, etc.)     denies 5. DIFFICULTY BREATHING: Are you having difficulty breathing? If Yes, ask: How bad is it? (e.g., mild, moderate, severe)      Mild- above baseline.  [facility] does not appreciate audible SOB/wheezing during call. Pt is speaking in full sentences.  6. FEVER: Do you have a fever? If Yes, ask: What is your temperature, how was it  measured, and when did it start?     denies 7. CARDIAC HISTORY: Do you have any history of heart disease? (e.g., heart attack, congestive heart failure)      Hx of heart attack 8. LUNG HISTORY: Do you have any history of lung disease?  (e.g., pulmonary embolus, asthma, emphysema)     *No Answer* 9. PE RISK FACTORS: Do you have a  history of blood clots? (or: recent major surgery, recent prolonged travel, bedridden)     denies 10. OTHER SYMPTOMS: Do you have any other symptoms? (e.g., runny nose, wheezing, chest pain)       denies 11. PREGNANCY: Is there any chance you are pregnant? When was your last menstrual period?       N/a 12. TRAVEL: Have you traveled out of the country in the last month? (e.g., travel history, exposures)       N/a  Protocols used: Cough - Acute Productive-A-AH

## 2024-03-26 ENCOUNTER — Other Ambulatory Visit: Payer: Self-pay | Admitting: Family Medicine

## 2024-03-26 MED ORDER — AZITHROMYCIN 250 MG PO TABS: ORAL_TABLET | ORAL | 0 refills | Status: AC

## 2024-03-28 DIAGNOSIS — H1045 Other chronic allergic conjunctivitis: Secondary | ICD-10-CM | POA: Diagnosis not present

## 2024-03-28 DIAGNOSIS — H401122 Primary open-angle glaucoma, left eye, moderate stage: Secondary | ICD-10-CM | POA: Diagnosis not present

## 2024-03-28 DIAGNOSIS — H01134 Eczematous dermatitis of left upper eyelid: Secondary | ICD-10-CM | POA: Diagnosis not present

## 2024-03-28 DIAGNOSIS — H43392 Other vitreous opacities, left eye: Secondary | ICD-10-CM | POA: Diagnosis not present

## 2024-03-28 DIAGNOSIS — G43B0 Ophthalmoplegic migraine, not intractable: Secondary | ICD-10-CM | POA: Diagnosis not present

## 2024-03-28 DIAGNOSIS — H3581 Retinal edema: Secondary | ICD-10-CM | POA: Diagnosis not present

## 2024-03-28 DIAGNOSIS — H04123 Dry eye syndrome of bilateral lacrimal glands: Secondary | ICD-10-CM | POA: Diagnosis not present

## 2024-03-28 DIAGNOSIS — H01135 Eczematous dermatitis of left lower eyelid: Secondary | ICD-10-CM | POA: Diagnosis not present

## 2024-03-29 ENCOUNTER — Telehealth: Payer: Self-pay

## 2024-03-29 DIAGNOSIS — I480 Paroxysmal atrial fibrillation: Secondary | ICD-10-CM

## 2024-03-29 MED ORDER — APIXABAN 5 MG PO TABS: 5.0000 mg | ORAL_TABLET | Freq: Two times a day (BID) | ORAL | 11 refills | Status: AC

## 2024-03-29 MED ORDER — AMIODARONE HCL 200 MG PO TABS: 200.0000 mg | ORAL_TABLET | Freq: Every day | ORAL | 3 refills | Status: DC

## 2024-03-29 NOTE — Telephone Encounter (Signed)
-----   Message from Maude Emmer sent at 03/29/2024  1:34 PM EST ----- High PAF burden. Dr Carlie indicated ok to resume anticoagulaiton. Start eliquis 5 mg bid. Start amiodarone 200 mg daily. F/U with EP/afib clinic next available I believe he has f/u with me already

## 2024-03-30 ENCOUNTER — Other Ambulatory Visit: Payer: Self-pay

## 2024-04-02 ENCOUNTER — Other Ambulatory Visit: Payer: Self-pay | Admitting: Family Medicine

## 2024-04-02 DIAGNOSIS — E785 Hyperlipidemia, unspecified: Secondary | ICD-10-CM

## 2024-04-03 ENCOUNTER — Ambulatory Visit (HOSPITAL_COMMUNITY)
Admission: RE | Admit: 2024-04-03 | Discharge: 2024-04-03 | Disposition: A | Discharge status: Home / Self Care | Source: Ambulatory Visit | Attending: Physician Assistant | Admitting: Physician Assistant

## 2024-04-03 VITALS — BP 122/60 | HR 93 | Ht 73.0 in | Wt 207.0 lb

## 2024-04-03 DIAGNOSIS — D6869 Other thrombophilia: Secondary | ICD-10-CM | POA: Diagnosis not present

## 2024-04-03 DIAGNOSIS — I480 Paroxysmal atrial fibrillation: Secondary | ICD-10-CM | POA: Diagnosis not present

## 2024-04-03 DIAGNOSIS — I4891 Unspecified atrial fibrillation: Secondary | ICD-10-CM | POA: Diagnosis not present

## 2024-04-03 NOTE — Progress Notes (Signed)
 Primary Care Physician: Cook, Jayce G, DO Primary Cardiologist: Maude Emmer, MD Electrophysiologist: None  Referring Physician: Dr Emmer Manus [name] [name] is a 83 y.o. male with a history of AAA, CAD, HTN, HLD, emphysema, atrial fibrillation who presents for follow up in the [facility].  The patient was hospitalized 02/04/24 with dyspnea and weakness. On arrival of EMS team, he was noted to be hypotensive with a BP of 88/72 and was in atrial fibrillation heart rate of 120s to 150s. He was started on IV diltiazem  and converted to SR. He had issues recently with nosebleeds and was not started on anticoagulation at that time. He is s/p sphenopalatine ligation on right and left septoplasty on 02/15/24. He wore a cardiac monitor which showed 21% afib burden, started on Eliquis for stroke prevention. Amiodarone was prescribed for rhythm control but he has not started this yet.    Patient presents today for follow up for atrial fibrillation. He is in SR today with frequent ectopy and feels well. In hindsight, for the past several months he has had intermittent fatigue with exertion and dizziness, similar to but not as severe as the episode that took him to the ED. No bleeding issues since starting Eliquis.   Today, he denies symptoms of palpitations, chest pain, shortness of breath, orthopnea, PND, lower extremity edema, presyncope, syncope, snoring, daytime somnolence, bleeding, or neurologic sequela. The patient is tolerating medications without difficulties and is otherwise without complaint today.    Atrial Fibrillation Risk Factors:  he does not have symptoms or diagnosis of sleep apnea. he does not have a history of rheumatic fever. he does not have a history of alcohol use. The patient does not have a history of early familial atrial fibrillation or other arrhythmias.  Atrial Fibrillation Management history:  Previous antiarrhythmic drugs: none Previous  cardioversions: none Previous ablations: none Anticoagulation history: Eliquis  ROS- All systems are reviewed and negative except as per the HPI above.  Past Medical History:  Diagnosis Date   AAA (abdominal aortic aneurysm)    needs yearly ultrasound   Allergy    Anemia    Arthritis    Asthma    BCC (basal cell carcinoma) 08/18/1989   left shoulder blad, upper right arm, left arm beyond elbow, c&d   BCC (basal cell carcinoma) 01/31/1992   Posterior neck, curetx3, 51fu   BCC (basal cell carcinoma) 11/22/2001   mid forehead, cx3, excision, right forearm, cx3, 55fu   BCC (basal cell carcinoma) 10/09/2003   mid forehead, MOHs   BCC (basal cell carcinoma) 08/15/2008   upper left back, biopsy   BPH (benign prostatic hyperplasia)    CAD (coronary artery disease)    Cancer (HCC)    skin cancer   COPD (chronic obstructive pulmonary disease) (HCC)    Dysrhythmia    pt. states it can be fast at times   GERD (gastroesophageal reflux disease)    Glaucoma    History of acute pancreatitis 12/21/2022   HOH (hard of hearing)    Hypercholesterolemia    Hypertension    Impaired fasting glucose    Low back pain    Melanoma in situ (HCC) 10/09/2003   left chin, MOHs   MI (myocardial infarction) (HCC) 1999   Peripheral vascular disease    AAA   SCC (squamous cell carcinoma) 07/03/2014   in situ, behind left ear, cx3, cautery, 38fu   SCC (squamous cell carcinoma) 07/03/2014   well diff, left  forearm, biopsy, cx1, cautery   SCC (squamous cell carcinoma) 07/20/2017   in situ, left upper arm, cx3, 27fu   SCC (squamous cell carcinoma) 01/10/2019   in situ, left post shoulder, cx3, 50fu   SCC (squamous cell carcinoma) 11/22/2001   left forearm distal, left forearm, cx3, 21fu   SCC (squamous cell carcinoma) 10/09/2003   [place], left ear post, clear per st, right cheek clear   SCC (squamous cell carcinoma) 03/30/2004   in situ, left upper arm, cx3, 12fu   SCC (squamous cell carcinoma)  03/08/2005   in situ, right cheek, mid upper forehead, cx3, 66fu   SCC (squamous cell carcinoma) 06/08/2006   in situ, left shoulder, cx3, 54fu   SCC (squamous cell carcinoma) 05/05/2010   right inner wrist, biopsy   SCC (squamous cell carcinoma) 09/13/2013   in situ, right crown scalp, front scalp, biopsy   Thrush     Current Outpatient Medications  Medication Sig Dispense Refill   albuterol  (PROVENTIL ) (2.5 MG/3ML) 0.083% nebulizer solution Take 3 mLs (2.5 mg total) by nebulization every 6 (six) hours as needed for wheezing or shortness of breath. (Patient taking differently: Take 2.5 mg by nebulization 2 (two) times daily.) 360 mL 5   albuterol  (VENTOLIN  HFA) 108 (90 Base) MCG/ACT inhaler Inhale 2 puffs into the lungs every 4 (four) hours as needed for wheezing or shortness of breath. (Patient taking differently: Inhale 2 puffs into the lungs as needed for wheezing or shortness of breath.) 18 each 0   apixaban (ELIQUIS) 5 MG TABS tablet Take 1 tablet (5 mg total) by mouth 2 (two) times daily. 60 tablet 11   esomeprazole  (NEXIUM ) 40 MG capsule Take 30-60 min before first meal of the day 30 capsule 11   famotidine  (PEPCID ) 20 MG tablet One after supper (Patient taking differently: Take 20 mg by mouth daily. One after supper) 30 tablet 11   fluticasone -salmeterol (ADVAIR  HFA) 230-21 MCG/ACT inhaler Take 2 puffs first thing in am and then another 2 puffs about 12 hours later. 1 each 12   latanoprost  (XALATAN ) 0.005 % ophthalmic solution Place 1 drop into both eyes at bedtime.     loratadine  (CLARITIN ) 10 MG tablet Take 10 mg by mouth daily.     LORazepam  (ATIVAN ) 0.5 MG tablet Take 1 tablet (0.5 mg total) by mouth at bedtime as needed. 30 tablet 3   lovastatin  (MEVACOR ) 40 MG tablet Take 1 tablet (40 mg total) by mouth daily. 90 tablet 0   metoprolol  tartrate (LOPRESSOR ) 25 MG tablet Take 0.5 tablets (12.5 mg total) by mouth 2 (two) times daily. 180 tablet 3   metroNIDAZOLE  (METROGEL ) 0.75 % gel  Apply 1 Application topically daily as needed (rosacea). (Patient taking differently: Apply 1 Application topically as needed (rosacea).)     oxymetazoline  (AFRIN) 0.05 % nasal spray Place 1 spray into both nostrils 2 (two) times daily as needed (Nose bleed). (Patient taking differently: Place 1 spray into both nostrils as needed (Nose bleed).)     Spacer/Aero-Holding Raguel FRENCH Use as directed 1 each 0   tamsulosin  (FLOMAX ) 0.4 MG CAPS capsule Take 2 capsules (0.8 mg total) by mouth daily. 180 capsule 01   triamcinolone  cream (KENALOG ) 0.1 % Apply 1 Application topically 2 (two) times daily as needed (itching).     amiodarone (PACERONE) 200 MG tablet Take 1 tablet (200 mg total) by mouth daily. (Patient not taking: Reported on 04/03/2024) 90 tablet 3   No current facility-administered medications for this encounter.  Physical Exam: BP 122/60   Pulse 93   Ht 6' 1 (1.854 m)   Wt 93.9 kg   BMI 27.31 kg/m   GEN: Well nourished, well developed in no acute distress CARDIAC: Regular rate and rhythm with frequent ectopy, no murmurs, rubs, gallops RESPIRATORY:  Clear to auscultation without rales, wheezing or rhonchi  ABDOMEN: Soft, non-tender, non-distended EXTREMITIES:  No edema; No deformity   Wt Readings from Last 3 Encounters:  04/03/24 93.9 kg  03/14/24 96 kg  03/08/24 96 kg     EKG Interpretation Date/Time:  Tuesday April 03 2024 14:49:38 EST Ventricular Rate:  93 PR Interval:  144 QRS Duration:  84 QT Interval:  344 QTC Calculation: 427 R Axis:   27  Text Interpretation: Sinus rhythm with frequent Premature ventricular complexes and Premature atrial complexes ST & T wave abnormality, consider inferolateral ischemia Abnormal ECG When compared with ECG of 14-Mar-2024 18:56, Premature ventricular complexes are now Present Confirmed by Herberta Pickron ([id number]) on 04/03/2024 2:55:13 PM    Echo 02/05/24 demonstrated   1. Left ventricular ejection fraction, by estimation, is 55  to 60%. Left  ventricular ejection fraction by 2D MOD biplane is 55.7 %. The left  ventricle has normal function. The left ventricle has no regional wall  motion abnormalities. Left ventricular  diastolic parameters are consistent with Grade I diastolic dysfunction  (impaired relaxation).   2. Right ventricular systolic function is low normal. The right  ventricular size is normal. Tricuspid regurgitation signal is inadequate  for assessing PA pressure.   3. Right atrial size was moderately dilated.   4. The mitral valve is degenerative. Trivial mitral valve regurgitation.  No evidence of mitral stenosis.   5. Very difficult to visulalize aortic valve leaflets. . The aortic valve  is calcified. Aortic valve regurgitation is trivial. Aortic valve  sclerosis is present, with no evidence of aortic valve stenosis.   6. Aortic dilatation noted. There is mild dilatation of the aortic root,  measuring 40 mm.   7. The inferior vena cava is normal in size with greater than 50%  respiratory variability, suggesting right atrial pressure of 3 mmHg.    CHA2DS2-VASc Score = 4  The patient's score is based upon: CHF History: 0 HTN History: 1 Diabetes History: 0 Stroke History: 0 Vascular Disease History: 1 Age Score: 2 Gender Score: 0       ASSESSMENT AND PLAN: Paroxysmal Atrial Fibrillation (ICD10:  I48.0) The patient's CHA2DS2-VASc score is 4, indicating a 4.8% annual risk of stroke.   Recent monitor showed 21% afib burden, 8% PVCs. General education about afib provided and questions answered. We also discussed his stroke risk and the risks and benefits of anticoagulation. We also discussed rhythm control options. Long term, he is interested in ablation to avoid possible off target effects of AAD, will refer to EP.  Start amiodarone 200 mg daily for now.  Continue Eliquis 5 mg BID Continue Lopressor  12.5 mg BID  Secondary Hypercoagulable State (ICD10:  D68.69) The patient is at  significant risk for stroke/thromboembolism based upon his CHA2DS2-VASc Score of 4.  Continue Apixaban (Eliquis). No recent bleeding issues.   CAD No anginal symptoms Followed by Dr Delford  HTN Stable on current regimen   Follow up with Dr Delford as scheduled and then with EP to discuss ablation.     [facility] [address] [place], [place] [address] [phone]

## 2024-04-05 DIAGNOSIS — L570 Actinic keratosis: Secondary | ICD-10-CM | POA: Diagnosis not present

## 2024-04-05 DIAGNOSIS — L720 Epidermal cyst: Secondary | ICD-10-CM | POA: Diagnosis not present

## 2024-04-05 DIAGNOSIS — X32XXXD Exposure to sunlight, subsequent encounter: Secondary | ICD-10-CM | POA: Diagnosis not present

## 2024-04-06 DIAGNOSIS — Z23 Encounter for immunization: Secondary | ICD-10-CM | POA: Diagnosis not present

## 2024-04-09 ENCOUNTER — Encounter: Payer: Self-pay | Admitting: Cardiovascular Disease

## 2024-04-17 NOTE — Progress Notes (Unsigned)
 Electrophysiology Office Note:   Date:  04/18/2024  ID:  [name] [name], DOB [birth year], MRN [id number]  Primary Cardiologist: Maude Emmer, MD Primary Heart Failure: None Electrophysiologist: None      History of Present Illness:   [name] [name] is a 83 y.o. male with h/o AAA, coronary disease, hypertension, hyperlipidemia, COPD, atrial fibrillation seen today for  for Electrophysiology evaluation of atrial fibrillation at the request of [name] [name].    He was hospitalized 02/04/2024 with dyspnea and weakness.  He was noted to be hypotensive and in atrial fibrillation with heart rates of 120-150.  He was started on diltiazem  and converted to sinus rhythm.  Due to recent nosebleeds, he was not anticoagulated.  He is post sphenopalatine teen ligation on the right and left septoplasty on 02/15/2024.  He wore a cardiac monitor that showed a 21% atrial fibrillation burden and he has been started on Eliquis .  He was started on amiodarone  as well.  Discussed the use of AI scribe software for clinical note transcription with the patient, who gave verbal consent to proceed.  History of Present Illness [name] [name] is an 83 year old male with atrial fibrillation who presents for evaluation of his heart rhythm management. He was referred by Dr. Nishan for further evaluation of his atrial fibrillation.  In mid-September, he experienced dizziness and near blackouts during physical activities at home, leading to an emergency room visit where he was diagnosed with atrial fibrillation. He had been feeling unwell for a couple of weeks prior to this incident. Monitoring revealed he was in atrial fibrillation about 21% of the time.  He was initially prescribed metoprolol  and amiodarone . However, he discontinued amiodarone  three days ago due to exacerbation of dry eyes and glaucoma.  He has a history of coronary artery disease. He also has COPD and an enlarged prostate. He reports being overweight, not getting  enough exercise, and not eating properly.  He lives alone and has a daughter who is available to assist him during certain periods.    Review of systems complete and found to be negative unless listed in HPI.   EP Information / Studies Reviewed:    EKG is not ordered today. EKG from 04/05/2024 reviewed which showed atrial fibrillation       Risk Assessment/Calculations:    CHA2DS2-VASc Score = 4   This indicates a 4.8% annual risk of stroke. The patient's score is based upon: CHF History: 0 HTN History: 1 Diabetes History: 0 Stroke History: 0 Vascular Disease History: 1 Age Score: 2 Gender Score: 0            Physical Exam:   VS:  BP 130/80 (BP Location: Right Arm, Patient Position: Sitting, Cuff Size: Normal)   Pulse 75   Ht 6' 1 (1.854 m)   Wt 208 lb 9.6 oz (94.6 kg)   SpO2 98%   BMI 27.52 kg/m    Wt Readings from Last 3 Encounters:  04/18/24 208 lb 9.6 oz (94.6 kg)  04/03/24 207 lb (93.9 kg)  03/14/24 211 lb 9.6 oz (96 kg)     GEN: Well nourished, well developed in no acute distress NECK: No JVD; No carotid bruits CARDIAC: Regular rate and rhythm, no murmurs, rubs, gallops RESPIRATORY:  Clear to auscultation without rales, wheezing or rhonchi  ABDOMEN: Soft, non-tender, non-distended EXTREMITIES:  No edema; No deformity   ASSESSMENT AND PLAN:    1.  Paroxysmal atrial fibrillation: 21% burden on cardiac monitor.  On metoprolol .  He has stopped his amiodarone  as it was making him feel poorly, and he was concerned about eye issues.  He would prefer rhythm control.  He would like to avoid long-term antiarrhythmics.  Due to that, we [name] [name] plan for ablation.  Risks and benefits have been discussed.  He understands the risks and has agreed to the procedure.  In the interim, we [name] [name] start him on Multaq for maintenance of sinus rhythm.  Risk, benefits, and alternatives to EP study and radiofrequency/pulse field ablation for afib were also discussed in detail today.  These risks include but are not limited to stroke, bleeding, vascular damage, tamponade, perforation, damage to the esophagus, lungs, and other structures, pulmonary vein stenosis, worsening renal function, and death. The patient understands these risk and wishes to proceed.  We [name] [name] therefore proceed with catheter ablation at the next available time.  Carto, ICE, anesthesia are requested for the procedure.  This patient [name] [name] NOT require CT prior to ablation  2.  Secondary hypercoagulable state: On Eliquis   3.    4.  Hypertension: Well-controlled  5.  Coronary artery disease: PCI to the RCA in 1999.  Has fixed ischemia but low risk on most recent Myoview .  6.  AAA: Post endograft repair March 2025.  Follows with vascular surgery.  Follow up with EP Team as usual post procedure  Signed, [name] Truax Gladis Norton, MD

## 2024-04-18 ENCOUNTER — Ambulatory Visit: Attending: Cardiology | Admitting: Cardiology

## 2024-04-18 ENCOUNTER — Encounter: Payer: Self-pay | Admitting: Cardiology

## 2024-04-18 VITALS — BP 130/80 | HR 75 | Ht 73.0 in | Wt 208.6 lb

## 2024-04-18 DIAGNOSIS — I10 Essential (primary) hypertension: Secondary | ICD-10-CM | POA: Diagnosis not present

## 2024-04-18 DIAGNOSIS — I480 Paroxysmal atrial fibrillation: Secondary | ICD-10-CM | POA: Diagnosis not present

## 2024-04-18 DIAGNOSIS — I2510 Atherosclerotic heart disease of native coronary artery without angina pectoris: Secondary | ICD-10-CM | POA: Insufficient documentation

## 2024-04-18 DIAGNOSIS — D6869 Other thrombophilia: Secondary | ICD-10-CM | POA: Diagnosis not present

## 2024-04-18 MED ORDER — DRONEDARONE HCL 400 MG PO TABS: 400.0000 mg | ORAL_TABLET | Freq: Two times a day (BID) | ORAL | 6 refills | Status: DC

## 2024-04-18 NOTE — Patient Instructions (Addendum)
 Medication Instructions:  Your physician has recommended you make the following change in your medication:  STOP Amiodarone  2.  START Multaq 400 mg twice a day -- start this on 05/09/2024  *If you need a refill on your cardiac medications before your next appointment, please call your pharmacy*   Lab Work: Pre procedure labs -- we will call you to schedule:  BMP & CBC  If you have a lab test that is abnormal and we need to change your treatment, we will call you to review the results -- otherwise no news is good news.    Testing/Procedures: Your physician has recommended that you have an ablation. Catheter ablation is a medical procedure used to treat some cardiac arrhythmias (irregular heartbeats). During catheter ablation, a long, thin, flexible tube is put into a blood vessel in your groin (upper thigh), or neck. This tube is called an ablation catheter. It is then guided to your heart through the blood vessel. Radio frequency waves destroy small areas of heart tissue where abnormal heartbeats may cause an arrhythmia to start. Please review the information below on ablation and after care.  Your ablation is scheduled for 07/06/2024. Please arrive at [facility] at 5:30 am.  We will call/send instructions at a later date.   Follow-Up: At [facility], you and your health needs are our priority.  As part of our continuing mission to provide you with exceptional heart care, we have created designated Provider Care Teams.  These Care Teams include your primary Cardiologist (physician) and Advanced Practice Providers (APPs -  Physician Assistants and Nurse Practitioners) who all work together to provide you with the care you need, when you need it.  Your next appointment:   1 month(s) after your ablation  The format for your next appointment:   In Person  Provider:   AFib clinic   Thank you for choosing [facility]!!   Maeola Domino, RN [phone]    Other  Instructions  Dronedarone Tablets What is this medication? DRONEDARONE (droe NE da rone) treats a fast or irregular heartbeat (arrhythmia). It works by slowing down overactive electric signals in the heart, which stabilizes your heart rhythm. It belongs to a group of medications called antiarrhythmics. This medicine may be used for other purposes; ask your health care provider or pharmacist if you have questions. COMMON BRAND NAME(S): Multaq What should I tell my care team before I take this medication? They need to know if you have any of these conditions: Heart failure History of stroke Irregular heartbeat or rhythm other than atrial fibrillation (AFib) Liver disease Lung disease Low levels of magnesium  in the blood Low levels of potassium in the blood Other heart conditions, such as heart block Pacemaker Permanent atrial fibrillation (AFib) Slow heartbeat An unusual or allergic reaction to dronedarone, other medications, foods, dyes, or preservatives Pregnant or trying to get pregnant Breastfeeding How should I use this medication? Take this medication by mouth with a glass of water. Follow the directions on the prescription label. Take one tablet with the morning meal and one tablet with the evening meal. Do not take your medication more often than directed. Do not stop taking except on the advice of your care team. A special MedGuide will be given to you by the pharmacist with each prescription and refill. Be sure to read this information carefully each time. Talk to your care team about the use of this medication in children. Special care may be needed. Overdosage: If you  think you have taken too much of this medicine contact a poison control center or emergency room at once. NOTE: This medicine is only for you. Do not share this medicine with others. What if I miss a dose? If you miss a dose, take it as soon as you can. If it is almost time for your next dose, take only that  dose. Do not take double or extra doses. What may interact with this medication? Do not take this medication with any of the following: Adagrasib Arsenic trioxide Certain antibiotics, such as clarithromycin, erythromycin, pentamidine, telithromycin, troleandomycin Certain medications for depression, such as tricyclic antidepressants Certain medications for fungal infections, such as fluconazole , itraconazole, ketoconazole , posaconazole, voriconazole Certain medications for irregular heart beat, such as amiodarone , disopyramide, flecainide, ibutilide, quinidine, propafenone, sotalol Certain medications for malaria, such as chloroquine, halofantrine Cisapride Cyclosporine Droperidol Haloperidol Methadone Nefazodone Other medications that cause heart rhythm changes, such as degarelix, encorafenib, entrectinib, eribulin, goserelin, lapatinib Phenothiazines, such as chlorpromazine, mesoridazine, prochlorperazine, thioridazine Pimozide Ritonavir Ziprasidone This medication may also interact with the following: Certain medications for blood pressure, heart disease, or irregular heart beat, such as diltiazem , metoprolol , propranolol, verapamil Certain medications for cholesterol, such as atorvastatin, lovastatin , simvastatin Certain medications for seizures, such as carbamazepine, phenobarbital, phenytoin Dabigatran Digoxin Dofetilide Grapefruit juice Rifampin Sirolimus St. John's wort Tacrolimus Warfarin This list may not describe all possible interactions. Give your health care provider a list of all the medicines, herbs, non-prescription drugs, or dietary supplements you use. Also tell them if you smoke, drink alcohol, or use illegal drugs. Some items may interact with your medicine. What should I watch for while using this medication? Visit your care team for regular checks on your progress. Tell your care team if your symptoms do not start to get better or if they get worse. This  medication may affect your coordination, reaction time, or judgment. Do not drive or operate machinery until you know how this medication affects you. Sit up or stand slowly to reduce the risk of dizzy or fainting spells. Talk to your care team if you may be pregnant. Serious birth defects can occur if you take this medication during pregnancy and for 5 days after the last dose. You will need a negative pregnancy test before starting this medication. Contraception is recommended while taking this medication and for 5 days after the last dose. You care team can help you find the option that works for you. Do not breastfeed while taking this medication and for 5 days after the last dose. What side effects may I notice from receiving this medication? Side effects that you should report to your care team as soon as possible: Allergic reactions--skin rash, itching, hives, swelling of the face, lips, tongue, or throat Slow heartbeat--dizziness, feeling faint or lightheaded, confusion, trouble breathing, unusual weakness or fatigue Heart failure--shortness of breath, swelling of the ankles, feet, or hands, sudden weight gain, unusual weakness or fatigue Heart rhythm changes--fast or irregular heartbeat, dizziness, feeling faint or lightheaded, chest pain, trouble breathing Kidney injury--decrease in the amount of urine, swelling of the ankles, hands, or feet Liver injury--right upper belly pain, loss of appetite, nausea, light-colored stool, dark yellow or brown urine, yellowing skin or eyes, unusual weakness or fatigue Lung injury--shortness of breath or trouble breathing, cough, spitting up blood, chest pain, fever Side effects that usually do not require medical attention (report to your care team if they continue or are bothersome): Diarrhea Nausea Stomach pain Vomiting This list may not describe  all possible side effects. Call your doctor for medical advice about side effects. You may report side  effects to FDA at 1-800-FDA-1088. Where should I keep my medication? Keep out of the reach of children. Store at room temperature between 15 and 30 degrees C (59 and 86 degrees F). Throw away any unused medication after the expiration date. NOTE: This sheet is a summary. It may not cover all possible information. If you have questions about this medicine, talk to your doctor, pharmacist, or health care provider.  2024 Elsevier/Gold Standard (2022-03-10 00:00:00)    Cardiac Ablation Cardiac ablation is a procedure to destroy (ablate) some heart tissue that is sending bad signals. These bad signals cause problems in heart rhythm. The heart has many areas that make these signals. If there are problems in these areas, they can make the heart beat in a way that is not normal. Destroying some tissues can help make the heart rhythm normal. Tell your doctor about: Any allergies you have. All medicines you are taking. These include vitamins, herbs, eye drops, creams, and over-the-counter medicines. Any problems you or family members have had with medicines that make you fall asleep (anesthetics). Any blood disorders you have. Any surgeries you have had. Any medical conditions you have, such as kidney failure. Whether you are pregnant or may be pregnant. What are the risks? This is a safe procedure. But problems may occur, including: Infection. Bruising and bleeding. Bleeding into the chest. Stroke or blood clots. Damage to nearby areas of your body. Allergies to medicines or dyes. The need for a pacemaker if the normal system is damaged. Failure of the procedure to treat the problem. What happens before the procedure? Medicines Ask your doctor about: Changing or stopping your normal medicines. This is important. Taking aspirin  and ibuprofen . Do not take these medicines unless your doctor tells you to take them. Taking other medicines, vitamins, herbs, and supplements. General  instructions Follow instructions from your doctor about what you cannot eat or drink. Plan to have someone take you home from the hospital or clinic. If you will be going home right after the procedure, plan to have someone with you for 24 hours. Ask your doctor what steps will be taken to prevent infection. What happens during the procedure?  An IV tube will be put into one of your veins. You will be given a medicine to help you relax. The skin on your neck or groin will be numbed. A cut (incision) will be made in your neck or groin. A needle will be put through your cut and into a large vein. A tube (catheter) will be put into the needle. The tube will be moved to your heart. Dye may be put through the tube. This helps your doctor see your heart. Small devices (electrodes) on the tube will send out signals. A type of energy will be used to destroy some heart tissue. The tube will be taken out. Pressure will be held on your cut. This helps stop bleeding. A bandage will be put over your cut. The exact procedure may vary among doctors and hospitals. What happens after the procedure? You will be watched until you leave the hospital or clinic. This includes checking your heart rate, breathing rate, oxygen, and blood pressure. Your cut will be watched for bleeding. You will need to lie still for a few hours. Do not drive for 24 hours or as long as your doctor tells you. Summary Cardiac ablation is a procedure to  destroy some heart tissue. This is done to treat heart rhythm problems. Tell your doctor about any medical conditions you may have. Tell him or her about all medicines you are taking to treat them. This is a safe procedure. But problems may occur. These include infection, bruising, bleeding, and damage to nearby areas of your body. Follow what your doctor tells you about food and drink. You may also be told to change or stop some of your medicines. After the procedure, do not drive  for 24 hours or as long as your doctor tells you. This information is not intended to replace advice given to you by your health care provider. Make sure you discuss any questions you have with your health care provider. Document Revised: 07/24/2021 Document Reviewed: 04/05/2019 Elsevier Patient Education  2023 Elsevier Inc.   Cardiac Ablation, Care After  This sheet gives you information about how to care for yourself after your procedure. Your health care provider may also give you more specific instructions. If you have problems or questions, contact your health care provider. What can I expect after the procedure? After the procedure, it is common to have: Bruising around your puncture site. Tenderness around your puncture site. Skipped heartbeats. If you had an atrial fibrillation ablation, you may have atrial fibrillation during the first several months after your procedure.  Tiredness (fatigue).  Follow these instructions at home: Puncture site care  Follow instructions from your health care provider about how to take care of your puncture site. Make sure you: If present, leave stitches (sutures), skin glue, or adhesive strips in place. These skin closures may need to stay in place for up to 2 weeks. If adhesive strip edges start to loosen and curl up, you may trim the loose edges. Do not remove adhesive strips completely unless your health care provider tells you to do that. If a large square bandage is present, this may be removed 24 hours after surgery.  Check your puncture site every day for signs of infection. Check for: Redness, swelling, or pain. Fluid or blood. If your puncture site starts to bleed, lie down on your back, apply firm pressure to the area, and contact your health care provider. Warmth. Pus or a bad smell. A pea or marble sized lump/knot at the site is normal and can take up to three months to resolve.  Driving Do not drive for at least 4 days after your  procedure or however long your health care provider recommends. (Do not resume driving if you have previously been instructed not to drive for other health reasons.) Do not drive or use heavy machinery while taking prescription pain medicine. Activity Avoid activities that take a lot of effort for at least 7 days after your procedure. Do not lift anything that is heavier than 5 lb (4.5 kg) for one week.  No sexual activity for 1 week.  Return to your normal activities as told by your health care provider. Ask your health care provider what activities are safe for you. General instructions Take over-the-counter and prescription medicines only as told by your health care provider. Do not use any products that contain nicotine or tobacco, such as cigarettes and e-cigarettes. If you need help quitting, ask your health care provider. You may shower after 24 hours, but Do not take baths, swim, or use a hot tub for 1 week.  Do not drink alcohol for 24 hours after your procedure. Keep all follow-up visits as told by your health care  provider. This is important. Contact a health care provider if: You have redness, mild swelling, or pain around your puncture site. You have fluid or blood coming from your puncture site that stops after applying firm pressure to the area. Your puncture site feels warm to the touch. You have pus or a bad smell coming from your puncture site. You have a fever. You have chest pain or discomfort that spreads to your neck, jaw, or arm. You have chest pain that is worse with lying on your back or taking a deep breath. You are sweating a lot. You feel nauseous. You have a fast or irregular heartbeat. You have shortness of breath. You are dizzy or light-headed and feel the need to lie down. You have pain or numbness in the arm or leg closest to your puncture site. Get help right away if: Your puncture site suddenly swells. Your puncture site is bleeding and the bleeding  does not stop after applying firm pressure to the area. These symptoms may represent a serious problem that is an emergency. Do not wait to see if the symptoms will go away. Get medical help right away. Call your local emergency services (911 in the [place] Do not drive yourself to the hospital. Summary After the procedure, it is normal to have bruising and tenderness at the puncture site in your groin, neck, or forearm. Check your puncture site every day for signs of infection. Get help right away if your puncture site is bleeding and the bleeding does not stop after applying firm pressure to the area. This is a medical emergency. This information is not intended to replace advice given to you by your health care provider. Make sure you discuss any questions you have with your health care provider.

## 2024-04-18 NOTE — Progress Notes (Unsigned)
 Patient ID: [name] [name], male   DOB: [birth year], 83 y.o.   MRN: [id number]      83 y.o. history of AAA, CAD pancreatitis PCI RCA 1999 with low risk myovue 2018. Clinically stable Intolerant to beta blockers with diarrhea . 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 And weakness Had been on Cefuroxime  by primary He had brief episode PAF converted with cardizem . He refused anticoagulation. Placed on lopressor  12.5 mg bid despite prior intolerance. Echo with EF 55-60% trivial MR and AV sclerosis. No source of infection found Troponin 72/74 no trend On d/c event monitor ordered but not done   Had recurrent epistaxis and had surgery 02/15/24  with ENT Dr Carlie with sphenopalatine ligation on right and left septoplasty.  Monitor read 03/29/24 showed PAF burden of 21%. Amiodarone  d/c 04/16/24 due to dry eyes/glaucoma He has been on lopressor . CHADVASC 4. Seen by Dr Inocencio 04/18/24 and started on Multaq  and planned for ablation next available. Back on eliquis  in interim.   His insurance is changing and I encouraged him to fill the Multaq  before price goes up and start it 12/24 when amiodarone  worn out  ROS: Denies fever, malais, weight loss, blurry vision, decreased visual acuity, cough, sputum, SOB, hemoptysis, pleuritic pain, palpitaitons, heartburn, abdominal pain, melena, lower extremity edema, claudication, or rash.  All other systems reviewed and  negative  General: There were no vitals taken for this visit.  Affect appropriate Healthy:  appears stated age HEENT: hard of hearing Aides in place  Neck supple with no adenopathy JVP normal no bruits no thyromegaly Lungs Mild exp wheezing and good diaphragmatic motion Heart:  S1/S2 no murmur, no rub, gallop or click PMI normal Abdomen: benighn, BS positve, no tenderness, AAA palpable not tender  no bruit.  No HSM or HJR Post right hernia surgery repair but persistent  Umbilical hernia  Distal pulses intact with no bruits No edema Neuro non-focal Skin warm and dry No muscular weakness   Current Outpatient Medications  Medication Sig Dispense Refill   albuterol  (PROVENTIL ) (2.5 MG/3ML) 0.083% nebulizer solution Take 3 mLs (2.5 mg total) by nebulization every 6 (six) hours as needed for wheezing or shortness of breath. (Patient taking differently: Take 2.5 mg by nebulization 2 (two) times daily.) 360 mL 5   albuterol  (VENTOLIN  HFA) 108 (90 Base) MCG/ACT inhaler Inhale 2 puffs into the lungs every 4 (four) hours as needed for wheezing or shortness of breath. (Patient taking differently: Inhale 2 puffs into the lungs as needed for wheezing or shortness of breath.) 18 each 0   apixaban  (ELIQUIS ) 5 MG TABS tablet Take 1 tablet (5 mg total) by mouth 2 (two) times daily. 60 tablet 11   [START ON 05/09/2024] dronedarone  (MULTAQ ) 400 MG tablet Take 1 tablet (400 mg total) by mouth 2 (two) times daily with a meal. 60 tablet 6   esomeprazole  (NEXIUM ) 40 MG capsule  Take 30-60 min before first meal of the day 30 capsule 11   fluticasone -salmeterol (ADVAIR  HFA) 230-21 MCG/ACT inhaler Take 2 puffs first thing in am and then another 2 puffs about 12 hours later. 1 each 12   latanoprost  (XALATAN ) 0.005 % ophthalmic solution Place 1 drop into both eyes at bedtime.     loratadine  (CLARITIN ) 10 MG tablet Take 10 mg by mouth daily.     LORazepam  (ATIVAN ) 0.5 MG tablet Take 1 tablet (0.5 mg total) by  mouth at bedtime as needed. 30 tablet 3   lovastatin  (MEVACOR ) 40 MG tablet Take 1 tablet (40 mg total) by mouth daily. 90 tablet 0   metoprolol  tartrate (LOPRESSOR ) 25 MG tablet Take 0.5 tablets (12.5 mg total) by mouth 2 (two) times daily. 180 tablet 3   metroNIDAZOLE  (METROGEL ) 0.75 % gel Apply 1 Application topically daily as needed (rosacea). (Patient taking differently: Apply 1 Application topically as needed (rosacea).)     oxymetazoline  (AFRIN) 0.05 % nasal spray Place 1 spray into both nostrils 2 (two) times daily as needed (Nose bleed). (Patient taking differently: Place 1 spray into both nostrils as needed (Nose bleed).)     Spacer/Aero-Holding Raguel FRENCH Use as directed 1 each 0   tamsulosin  (FLOMAX ) 0.4 MG CAPS capsule Take 2 capsules (0.8 mg total) by mouth daily. 180 capsule 01   triamcinolone  cream (KENALOG ) 0.1 % Apply 1 Application topically 2 (two) times daily as needed (itching).     No current facility-administered medications for this visit.    Allergies  Bactrim  [sulfamethoxazole -trimethoprim ], Beta adrenergic blockers, Ciprofloxacin , Doxycycline , Lasix  [furosemide ], Dexamethasone , Gabapentin , Methocarbamol, Myrbetriq  [mirabegron ], Neomycin, Penicillins, and Tetracyclines & related  Electrocardiogram:  04/07/18  SR PAC;s otherwise normal 04/18/2024 SR rate 69 normal   Assessment and Plan  CAD:  Distant PCI RCA 1999. Myovue 05/2022 fixed old IMI no ischemia low risk  Clinically stable back on beta blocker see below   AAA: F/U Dr Gretta post endograft repair 07/2023   HTN:  Well controlled.  Continue current medications and low sodium Dash type diet.    GERD:  Continue carafate  and nexium  f/u GI  Asthma/Emphysema :  On inhalers no active wheezing F/U [place]   PAF/SVT:  recent occurrence 01/2024 with conversion cardizem . Patient refused anticoagulation and had epistasis with surgical intervention 02/15/24 Monitor with high PAF burden. Back on eliquis  Started on Multaq   with plans for ablation with Dr Inocencio   Anxiety/Depression:   Has not started on SSRI   ENT:  post surgical intervention above for epistaxis stents removed  hct 43.2 02/15/24   HLD:  On mevacor  40 mg daily LDL 67 at goal   Hernia:  F/u Mavis post surgery 2021 improved   F/U EP post ablation Continue Multaq   F/U with me in a year     [employer]

## 2024-04-20 ENCOUNTER — Telehealth: Payer: Self-pay | Admitting: Cardiovascular Disease

## 2024-04-20 ENCOUNTER — Encounter: Payer: Self-pay | Admitting: Cardiovascular Disease

## 2024-04-20 MED ORDER — ATENOLOL 50 MG PO TABS: 50.0000 mg | ORAL_TABLET | Freq: Every day | ORAL | 11 refills | Status: DC

## 2024-04-20 NOTE — Telephone Encounter (Signed)
 Pt c/o medication issue:  1. Name of Medication: metoprolol  tartrate (LOPRESSOR ) 25 MG tablet   2. How are you currently taking this medication (dosage and times per day)? As written   3. Are you having a reaction (difficulty breathing--STAT)? No   4. What is your medication issue? Pt states he is having Diarrhea and would like a c/b from a nurse please advise

## 2024-04-20 NOTE — Telephone Encounter (Signed)
 Spoke with patient in regards to metoprolol  and Eliquis . States he has been having diarrhea for last 7 days and has a history of having diarrhea with metoprolol . No other symptoms. BP 130's/80's, HR 80s. Will send message to pharmacy and Dr Delford for further recommendations. Pt verbalizes understanding.

## 2024-04-24 ENCOUNTER — Encounter: Payer: Self-pay | Admitting: Family Medicine

## 2024-04-25 ENCOUNTER — Encounter: Payer: Self-pay | Admitting: Cardiovascular Disease

## 2024-04-25 ENCOUNTER — Other Ambulatory Visit: Payer: Self-pay

## 2024-04-25 ENCOUNTER — Ambulatory Visit: Attending: Cardiovascular Disease | Admitting: Cardiovascular Disease

## 2024-04-25 VITALS — BP 97/59 | HR 54 | Ht 73.0 in | Wt 210.0 lb

## 2024-04-25 DIAGNOSIS — E782 Mixed hyperlipidemia: Secondary | ICD-10-CM | POA: Diagnosis present

## 2024-04-25 DIAGNOSIS — I480 Paroxysmal atrial fibrillation: Secondary | ICD-10-CM | POA: Insufficient documentation

## 2024-04-25 DIAGNOSIS — I2510 Atherosclerotic heart disease of native coronary artery without angina pectoris: Secondary | ICD-10-CM | POA: Insufficient documentation

## 2024-04-25 DIAGNOSIS — I10 Essential (primary) hypertension: Secondary | ICD-10-CM | POA: Diagnosis present

## 2024-04-25 MED ORDER — DRONEDARONE HCL 400 MG PO TABS: 400.0000 mg | ORAL_TABLET | Freq: Two times a day (BID) | ORAL | 3 refills | Status: AC

## 2024-04-25 NOTE — Patient Instructions (Addendum)
 Medication Instructions:   NO CHANGES  CONTINUE  TAKING MULTAQ  AS WITH ALL YOUR OTHER MEDICATIONS-  90 DAY SUPPLY SENT PHARAMCY  *If you need a refill on your cardiac medications before your next appointment, please call your pharmacy*   Lab Work:  NOT NEEDED  If you have labs (blood work) drawn today and your tests are completely normal, you will receive your results only by: MyChart Message (if you have MyChart) OR A paper copy in the mail If you have any lab test that is abnormal or we need to change your treatment, we will call you to review the results.   Testing/Procedures: NOT NEEDED   Follow-Up: At [facility], you and your health needs are our priority.  As part of our continuing mission to provide you with exceptional heart care, we have created designated Provider Care Teams.  These Care Teams include your primary Cardiologist (physician) and Advanced Practice Providers (APPs -  Physician Assistants and Nurse Practitioners) who all work together to provide you with the care you need, when you need it.  We recommend signing up for the patient portal called MyChart.  Sign up information is provided on this After Visit Summary.  MyChart is used to connect with patients for Virtual Visits (Telemedicine).  Patients are able to view lab/test results, encounter notes, upcoming appointments, etc.  Non-urgent messages can be sent to your provider as well.   To learn more about what you can do with MyChart, go to [email or web address].    Your next appointment:   12 month(s)  The format for your next appointment:   In Person  Provider:   Maude Emmer, MD

## 2024-04-26 DIAGNOSIS — D040 Carcinoma in situ of skin of lip: Secondary | ICD-10-CM | POA: Diagnosis not present

## 2024-04-27 ENCOUNTER — Telehealth: Payer: Self-pay

## 2024-04-27 ENCOUNTER — Encounter: Payer: Self-pay | Admitting: Pulmonary Disease

## 2024-04-27 ENCOUNTER — Other Ambulatory Visit (HOSPITAL_COMMUNITY): Payer: Self-pay

## 2024-04-27 ENCOUNTER — Ambulatory Visit: Admitting: Pulmonary Disease

## 2024-04-27 VITALS — BP 127/78 | HR 63 | Ht 73.0 in | Wt 210.0 lb

## 2024-04-27 DIAGNOSIS — J449 Chronic obstructive pulmonary disease, unspecified: Secondary | ICD-10-CM | POA: Diagnosis not present

## 2024-04-27 DIAGNOSIS — Z87891 Personal history of nicotine dependence: Secondary | ICD-10-CM

## 2024-04-27 DIAGNOSIS — I480 Paroxysmal atrial fibrillation: Secondary | ICD-10-CM

## 2024-04-27 DIAGNOSIS — I2510 Atherosclerotic heart disease of native coronary artery without angina pectoris: Secondary | ICD-10-CM

## 2024-04-27 DIAGNOSIS — J4550 Severe persistent asthma, uncomplicated: Secondary | ICD-10-CM

## 2024-04-27 DIAGNOSIS — Q791 Other congenital malformations of diaphragm: Secondary | ICD-10-CM

## 2024-04-27 NOTE — Progress Notes (Unsigned)
 RYKIN ROUTE    [id number]    Apr 29, 1941  Primary Care Physician:Cook, Jayce G, DO  Referring Physician: Cook, Jayce G, DO [address] [place],  [place] [address]  Chief complaint:  ***  Discussed the use of AI scribe software for clinical note transcription with the patient, who gave verbal consent to proceed.  History of Present Illness [name] [name] is an 83 year old male with atrial fibrillation and chronic lung disease who presents for medication management and follow-up.  He uses Advair  for chronic lung disease, which he finds tolerable but less effective than Trelegy. He previously used Trelegy but discontinued it due to insurance and cost issues. He experiences occasional chest congestion and has been prescribed a Z-Pak for sinus infections, using it cautiously due to potential medication interactions.  He has a history of atrial fibrillation and is on Eliquis , a blood thinner. He is scheduled for an ablation procedure at the end of February. He is concerned about medications that may prolong the QRS interval, particularly antibiotics like Z-Pak.  Two months ago, he was prescribed a Z-Pak for a sinus infection, with advice to use it cautiously due to his chronic lung condition.  His lung condition impacts his daily life, and he makes efforts to stay active and avoid situations that lead to shortness of breath.     Pets: Occupation: Exposures: Smoking history: Travel history: Relevant family history:  Outpatient Encounter Medications as of 04/27/2024  Medication Sig   albuterol  (PROVENTIL ) (2.5 MG/3ML) 0.083% nebulizer solution Take 3 mLs (2.5 mg total) by nebulization every 6 (six) hours as needed for wheezing or shortness of breath. (Patient taking differently: Take 2.5 mg by nebulization 2 (two) times daily.)   albuterol  (VENTOLIN  HFA) 108 (90 Base) MCG/ACT inhaler Inhale 2 puffs into the lungs every 4 (four) hours as needed for wheezing or shortness  of breath. (Patient taking differently: Inhale 2 puffs into the lungs as needed for wheezing or shortness of breath.)   apixaban  (ELIQUIS ) 5 MG TABS tablet Take 1 tablet (5 mg total) by mouth 2 (two) times daily.   atenolol  (TENORMIN ) 50 MG tablet Take 1 tablet (50 mg total) by mouth daily. Stop metoprolol    [START ON 05/09/2024] dronedarone  (MULTAQ ) 400 MG tablet Take 1 tablet (400 mg total) by mouth 2 (two) times daily with a meal.   esomeprazole  (NEXIUM ) 40 MG capsule Take 30-60 min before first meal of the day   fluticasone -salmeterol (ADVAIR  HFA) 230-21 MCG/ACT inhaler Take 2 puffs first thing in am and then another 2 puffs about 12 hours later.   latanoprost  (XALATAN ) 0.005 % ophthalmic solution Place 1 drop into both eyes at bedtime.   loratadine  (CLARITIN ) 10 MG tablet Take 10 mg by mouth daily.   LORazepam  (ATIVAN ) 0.5 MG tablet Take 1 tablet (0.5 mg total) by mouth at bedtime as needed.   lovastatin  (MEVACOR ) 40 MG tablet Take 1 tablet (40 mg total) by mouth daily.   metroNIDAZOLE  (METROGEL ) 0.75 % gel Apply 1 Application topically daily as needed (rosacea). (Patient taking differently: Apply 1 Application topically as needed (rosacea).)   oxymetazoline  (AFRIN) 0.05 % nasal spray Place 1 spray into both nostrils 2 (two) times daily as needed (Nose bleed). (Patient taking differently: Place 1 spray into both nostrils as needed (Nose bleed).)   Spacer/Aero-Holding Raguel FRENCH Use as directed   tamsulosin  (FLOMAX ) 0.4 MG CAPS capsule Take 2 capsules (0.8 mg total) by mouth  daily.   triamcinolone  cream (KENALOG ) 0.1 % Apply 1 Application topically 2 (two) times daily as needed (itching).   No facility-administered encounter medications on file as of 04/27/2024.    Allergies as of 04/27/2024 - Review Complete 04/27/2024  Allergen Reaction Noted   Bactrim  [sulfamethoxazole -trimethoprim ] Hives 07/13/2022   Beta adrenergic blockers Diarrhea 08/01/2012   Ciprofloxacin   Nausea And Vomiting, Rash, and Other (See Comments) 11/04/2010   Doxycycline  Swelling 04/28/2023   Lasix  [furosemide ] Rash 02/20/2015   Dexamethasone  Swelling 03/27/2013   Gabapentin  Swelling 12/26/2014   Methocarbamol Swelling and Rash 03/27/2013   Myrbetriq  [mirabegron ] Hives and Itching 05/27/2023   Neomycin Swelling and Rash 03/09/2012   Penicillins Rash, Swelling, and Dermatitis 11/04/2010   Tetracyclines & related Itching 11/04/2010    Past Medical History:  Diagnosis Date   AAA (abdominal aortic aneurysm)    needs yearly ultrasound   Allergy    Anemia    Arthritis    Asthma    BCC (basal cell carcinoma) 08/18/1989   left shoulder blad, upper right arm, left arm beyond elbow, c&d   BCC (basal cell carcinoma) 01/31/1992   Posterior neck, curetx3, 37fu   BCC (basal cell carcinoma) 11/22/2001   mid forehead, cx3, excision, right forearm, cx3, 57fu   BCC (basal cell carcinoma) 10/09/2003   mid forehead, MOHs   BCC (basal cell carcinoma) 08/15/2008   upper left back, biopsy   BPH (benign prostatic hyperplasia)    CAD (coronary artery disease)    Cancer (HCC)    skin cancer   COPD (chronic obstructive pulmonary disease) (HCC)    Dysrhythmia    pt. states it can be fast at times   GERD (gastroesophageal reflux disease)    Glaucoma    History of acute pancreatitis 12/21/2022   HOH (hard of hearing)    Hypercholesterolemia    Hypertension    Impaired fasting glucose    Low back pain    Melanoma in situ (HCC) 10/09/2003   left chin, MOHs   MI (myocardial infarction) (HCC) 1999   Peripheral vascular disease    AAA   SCC (squamous cell carcinoma) 07/03/2014   in situ, behind left ear, cx3, cautery, 22fu   SCC (squamous cell carcinoma) 07/03/2014   well diff, left forearm, biopsy, cx1, cautery   SCC (squamous cell carcinoma) 07/20/2017   in situ, left upper arm, cx3, 7fu   SCC (squamous cell carcinoma) 01/10/2019   in situ, left  post shoulder, cx3, 34fu   SCC (squamous cell carcinoma) 11/22/2001   left forearm distal, left forearm, cx3, 30fu   SCC (squamous cell carcinoma) 10/09/2003   Bowens, left ear post, clear per st, right cheek clear   SCC (squamous cell carcinoma) 03/30/2004   in situ, left upper arm, cx3, 6fu   SCC (squamous cell carcinoma) 03/08/2005   in situ, right cheek, mid upper forehead, cx3, 13fu   SCC (squamous cell carcinoma) 06/08/2006   in situ, left shoulder, cx3, 8fu   SCC (squamous cell carcinoma) 05/05/2010   right inner wrist, biopsy   SCC (squamous cell carcinoma) 09/13/2013   in situ, right crown scalp, front scalp, biopsy   Thrush     Past Surgical History:  Procedure Laterality Date   ABDOMINAL AORTIC ENDOVASCULAR STENT GRAFT N/A 07/29/2023   Procedure: INSERTION, ENDOVASCULAR STENT GRAFT, AORTA, ABDOMINAL;  Surgeon: Gretta Lonni PARAS, MD;  Location: [facility] OR;  Service: Vascular;  Laterality: N/A;   BACK SURGERY     x 3  CARDIAC CATHETERIZATION     angioplasty   CATARACT EXTRACTION W/PHACO  03/20/2012   Procedure: CATARACT EXTRACTION PHACO AND INTRAOCULAR LENS PLACEMENT (IOC);  Surgeon: Dow JULIANNA Burke, MD;  Location: [facility] ORS;  Service: Ophthalmology;  Laterality: Right;  CDE:  8.45   CATARACT EXTRACTION W/PHACO Left 04/02/2013   Procedure: CATARACT EXTRACTION PHACO AND INTRAOCULAR LENS PLACEMENT (IOC);  Surgeon: Dow JULIANNA Burke, MD;  Location: [facility]S;  Service: Ophthalmology;  Laterality: Left;  CDE:  6.50   CHOLECYSTECTOMY  2000   COLONOSCOPY  2009   repeat 5 years   ESOPHAGOGASTRODUODENOSCOPY     HERNIA REPAIR Left    inguinal   INGUINAL HERNIA REPAIR Right 03/28/2020   Procedure: Right Inguinal Herniorrhaphy with Mesh;  Surgeon: Mavis Anes, MD;  Location: [facility]S;  Service: General;  Laterality: Right;   LAPAROSCOPIC PARTIAL COLECTOMY N/A 06/11/2013   Procedure: LAPAROSCOPIC HAND ASSISTED PARTIAL COLECTOMY;  Surgeon: Anes DELENA Mavis, MD;  Location: [facility]  ORS;  Service: General;  Laterality: N/A;   NASAL ENDOSCOPY WITH EPISTAXIS CONTROL Bilateral 02/11/2020   Procedure: NASAL ENDOSCOPY WITH EPISTAXIS CONTROL;  Surgeon: Karis Clunes, MD;  Location: [facility];  Service: ENT;  Laterality: Bilateral;   NASAL ENDOSCOPY WITH EPISTAXIS CONTROL Right 02/15/2024   Procedure: CONTROL OF EPISTAXIS RIGHT SIDE;  Surgeon: Carlie Clark, MD;  Location: [facility] OR;  Service: ENT;  Laterality: Right;   NASAL SINUS SURGERY Bilateral 02/15/2024   Procedure: BILATERAL NASAL ENDOSCOPY W/ CAUTERY AND LEFT SPHENOPALATINE CLIPPING;  Surgeon: Carlie Clark, MD;  Location[facility] OR;  Service: ENT;  Laterality: Bilateral;  BILATERAL NASAL ENDOSCOPY W/ CAUTERY AND LEFT SPHENOPALATINE CLIPPING   REPAIR ILIAC ARTERY Right 07/29/2023   Procedure: REPAIR, ARTERY, FEMORAL;  Surgeon: Gretta Lonni PARAS, MD;  Location[facility] OR;  Service: Vascular;  Laterality: Right;   right eye detached retina Bilateral    SEPTOPLASTY Left 02/15/2024   Procedure: SEPTOPLASTY, NOSE;  Surgeon: Carlie Clark, MD;  Location[facility] OR;  Service: ENT;  Laterality: Left;   SPINAL FUSION  2016   ULTRASOUND GUIDANCE FOR VASCULAR ACCESS Bilateral 07/29/2023   Procedure: ULTRASOUND GUIDANCE, FOR VASCULAR ACCESS;  Surgeon: Gretta Lonni PARAS, MD;  Location: [facility] OR;  Service: Vascular;  Laterality: Bilateral;   YAG LASER APPLICATION Left 05/06/2014   Procedure: YAG LASER APPLICATION;  Surgeon: Dow JULIANNA Burke, MD;  Location: [facility]S;  Service: Ophthalmology;  Laterality: Left;    Family History  Problem Relation Age of Onset   Hypertension Mother    COPD Father    Cancer Brother        brain    Social History   Socioeconomic History   Marital status: Widowed    Spouse name: Not on file   Number of children: Not on file   Years of education: Not on file   Highest education level: Not on file  Occupational History   Not on file  Tobacco Use   Smoking status: Former    Current  packs/day: 0.00    Average packs/day: 1 pack/day for 35.0 years (35.0 ttl pk-yrs)    Types: Cigarettes    Start date: 03/27/1968    Quit date: 03/28/2003    Years since quitting: 21.0   Smokeless tobacco: Never  Vaping Use   Vaping status: Never Used  Substance and Sexual Activity   Alcohol use: No    Alcohol/week: 0.0 standard drinks of alcohol   Drug use: No   Sexual activity: Not Currently  Birth control/protection: None  Other Topics Concern   Not on file  Social History Narrative   Not on file   Social Drivers of Health   Tobacco Use: Medium Risk (04/27/2024)   Patient History    Smoking Tobacco Use: Former    Smokeless Tobacco Use: Never    Passive Exposure: Not on file  Financial Resource Strain: Low Risk (03/18/2022)   Overall Financial Resource Strain (CARDIA)    Difficulty of Paying Living Expenses: Not very hard  Food Insecurity: Low Risk (03/05/2024)   Received from [facility]   Epic    Within the past 12 months, you worried that your food would run out before you got money to buy more: Never true    Within the past 12 months, the food you bought just didn't last and you didn't have money to get more. : Never true  Transportation Needs: No Transportation Needs (03/05/2024)   Received from [facility]    In the past 12 months, has lack of reliable transportation kept you from medical appointments, meetings, work or from getting things needed for daily living? : No  Physical Activity: Insufficiently Active (03/18/2022)   Exercise Vital Sign    Days of Exercise per Week: 2 days    Minutes of Exercise per Session: 20 min  Stress: No Stress Concern Present (03/18/2022)   [employer] of Occupational Health - Occupational Stress Questionnaire    Feeling of Stress : Not at all  Social Connections: Socially Isolated (02/05/2024)   Social Connection and Isolation Panel    Frequency of Communication with Friends and Family:  More than three times a week    Frequency of Social Gatherings with Friends and Family: Three times a week    Attends Religious Services: Never    Active Member of Clubs or Organizations: No    Attends Banker Meetings: Never    Marital Status: Widowed  Intimate Partner Violence: Not At Risk (02/05/2024)   Epic    Fear of Current or Ex-Partner: No    Emotionally Abused: No    Physically Abused: No    Sexually Abused: No  Depression (PHQ2-9): Low Risk (03/08/2024)   Depression (PHQ2-9)    PHQ-2 Score: 3  Alcohol Screen: Low Risk (03/18/2022)   Alcohol Screen    Last Alcohol Screening Score (AUDIT): 0  Housing: Low Risk (03/05/2024)   Received from [facility]   Epic    What is your living situation today?: I have a steady place to live    Think about the place you live. Do you have problems with any of the following? Choose all that apply:: None/None on this list  Utilities: Low Risk (03/05/2024)   Received from [facility]   Utilities    In the past 12 months has the electric, gas, oil, or water company threatened to shut off services in your home? : No  Health Literacy: Not on file    Review of Systems  Vitals:   04/27/24 1044  BP: 127/78  Pulse: 63  SpO2: 97%     Physical Exam   Data Reviewed: ***    Assessment and Plan Assessment & Plan Chronic obstructive pulmonary disease COPD with current use of Advair , which is tolerable but less effective than Trelegy or Breztri . Insurance issues prevent access to preferred medications. Discussed potential use of Z-Pak for exacerbations, with caution due to risk of QRS prolongation, especially with concurrent use of Eliquis . Emphasized that most  exacerbations are viral and antibiotics are not always necessary. Encouraged maintaining activity to support overall respiratory health. - Continue Advair  as current treatment. - Use Z-Pak for exacerbations if necessary, with caution regarding QRS  prolongation. - Encouraged maintaining physical activity to support respiratory health. - Discussed potential health programs for medication affordability.     No orders of the defined types were placed in this encounter.     Jennet Epley MD [facility] 04/27/2024, 11:00 AM  CC: Cook, Jayce G, DO

## 2024-04-27 NOTE — Telephone Encounter (Signed)
 Pharmacy Patient Advocate Encounter  Insurance verification completed.   The patient is insured through [employer] test claim for MULTAQ . Currently a quantity of 60 is a 30 day supply and the co-pay is $198.12 . PT HAS COMMERCIAL INSURANCE, NOT ELIGIBLE FOR PAP.   This test claim was processed through [facility]- copay amounts may vary at other pharmacies due to pharmacy/plan contracts, or as the patient moves through the different stages of their insurance plan.

## 2024-04-27 NOTE — Patient Instructions (Addendum)
 I will see you back in about 6 months  Call us  with significant concerns  Continue using your Advair   Make sure you are exercising on a regular basis

## 2024-05-03 ENCOUNTER — Other Ambulatory Visit: Payer: Self-pay

## 2024-05-03 ENCOUNTER — Ambulatory Visit: Payer: Self-pay | Admitting: Pulmonary Disease

## 2024-05-03 DIAGNOSIS — I480 Paroxysmal atrial fibrillation: Secondary | ICD-10-CM

## 2024-05-03 NOTE — Telephone Encounter (Signed)
 Answer Assessment - Initial Assessment Questions E2C2 Pulmonary Triage - Initial Assessment Questions Chief Complaint (e.g., cough, sob, wheezing, fever, chills, sweat or additional symptoms) *Go to specific symptom protocol after initial questions. Productive cough, mild SOB  How long have symptoms been present? X 3 days.  Have you tested for COVID or Flu? Note: If not, ask patient if a home test can be taken. If so, instruct patient to call back for positive results. No  MEDICINES:   Have you used any OTC meds to help with symptoms? Yes If yes, ask What medications? Dayquil, Nyquil  Have you used your inhalers/maintenance medication? Yes If yes, What medications? Advair , albuterol  nebulizer and albuterol  inhaler.  If inhaler, ask How many puffs and how often? Note: Review instructions on medication in the chart. Advair  2 puffs twice daily, albuterol  nebulizer twice daily, albuterol  inhaler 2 puffs 2-3 uses over the past few days and states he doesn't use it daily.  OXYGEN: Do you wear supplemental oxygen? No If yes, How many liters are you supposed to use? N/A.  Do you monitor your oxygen levels? Yes If yes, What is your reading (oxygen level) today? 96%  What is your usual oxygen saturation reading?  (Note: Pulmonary O2 sats should be 90% or greater) He states 96% is baseline       Copied from CRM #[id number]. Topic: Clinical - Red Word Triage >> May 03, 2024  9:17 AM Russell PARAS wrote: Red Word that prompted transfer to Nurse Triage:   Symptoms started about 3 days ago Chest congestion and severe cough Dark colored phlegm Symptoms are getting worse   Request of Zpak sent to [facility] on file  Pt of Dr Neda Reason for Disposition  [1] MILD difficulty breathing (e.g., minimal/no SOB at rest, SOB with walking, pulse < 100) AND [2] NEW-onset or WORSE than normal  Answer Assessment - Initial Assessment Questions E2C2 Pulmonary Triage -  Initial Assessment Questions Chief Complaint (e.g., cough, sob, wheezing, fever, chills, sweat or additional symptoms) *Go to specific symptom protocol after initial questions. Productive cough, mild SOB  How long have symptoms been present? X 3 days.  Have you tested for COVID or Flu? Note: If not, ask patient if a home test can be taken. If so, instruct patient to call back for positive results. No  MEDICINES:   Have you used any OTC meds to help with symptoms? Yes If yes, ask What medications? Dayquil, Nyquil  Have you used your inhalers/maintenance medication? Yes If yes, What medications? Advair , albuterol  nebulizer and albuterol  inhaler.  If inhaler, ask How many puffs and how often? Note: Review instructions on medication in the chart. Advair  2 puffs twice daily, albuterol  nebulizer twice daily, albuterol  inhaler 2 puffs 2-3 uses over the past few days and states he doesn't use it daily.  OXYGEN: Do you wear supplemental oxygen? No If yes, How many liters are you supposed to use? N/A.  Do you monitor your oxygen levels? Yes If yes, What is your reading (oxygen level) today? 96%  What is your usual oxygen saturation reading?  (Note: Pulmonary O2 sats should be 90% or greater) He states 96% is baseline    1. RESPIRATORY STATUS: Describe your breathing? (e.g., wheezing, shortness of breath, unable to speak, severe coughing)      Cough, SOB.  2. ONSET: When did this breathing problem begin?      X 3 days.  3. PATTERN Does the difficult breathing come and go, or has it  been constant since it started?      Comes and goes, states he notices it with the cough.  4. SEVERITY: How bad is your breathing? (e.g., mild, moderate, severe)      Mild.  5. RECURRENT SYMPTOM: Have you had difficulty breathing before? If Yes, ask: When was the last time? and What happened that time?      Yes. He states he thinks back in September he had a similar  issue.  6. CARDIAC HISTORY: Do you have any history of heart disease? (e.g., heart attack, angina, bypass surgery, angioplasty)      AAA (abdominal aortic aneurysm) Essential hypertension CAD (coronary artery disease) Celiac artery stenosis, afib    7. LUNG HISTORY: Do you have any history of lung disease?  (e.g., pulmonary embolus, asthma, emphysema)     COPD.  8. CAUSE: What do you think is causing the breathing problem?      Patient states he was told in the past by his pulmonologist that when he starts to feel sick like this (cold, chest tight, breathing gets harder, increased phlegm) to call in and get a Zpack sent in for him.  9. OTHER SYMPTOMS: Do you have any other symptoms? (e.g., chest pain, cough, dizziness, fever, runny nose)     Chills, productive cough with dark yellow to brown mucous.  10. O2 SATURATION MONITOR:  Do you use an oxygen saturation monitor (pulse oximeter) at home? If Yes, ask: What is your reading (oxygen level) today? What is your usual oxygen saturation reading? (e.g., 95%)       96%  Protocols used: Breathing Difficulty-A-AH

## 2024-05-03 NOTE — Telephone Encounter (Signed)
 Dr. Neda -  Per Triage:  Action needed- patient states Dr Neda told him to call in for a Zpack for symptoms (cough, dark yellow to brown mucous, mild SOB, chills). Patient also requested refill on his albuterol  rescue inhaler.  Please advise Zpack request. Pt's albuterol  inhaler was prescribed by a different provide, please advise if it is okay to refill. Thank you!

## 2024-05-05 ENCOUNTER — Emergency Department (HOSPITAL_COMMUNITY)
Admission: EM | Admit: 2024-05-05 | Discharge: 2024-05-05 | Disposition: A | Discharge status: Home / Self Care | Attending: Emergency Medicine | Admitting: Emergency Medicine

## 2024-05-05 ENCOUNTER — Emergency Department (HOSPITAL_COMMUNITY)

## 2024-05-05 ENCOUNTER — Other Ambulatory Visit: Payer: Self-pay

## 2024-05-05 ENCOUNTER — Encounter (HOSPITAL_COMMUNITY): Payer: Self-pay | Admitting: Emergency Medicine

## 2024-05-05 DIAGNOSIS — K5730 Diverticulosis of large intestine without perforation or abscess without bleeding: Secondary | ICD-10-CM | POA: Diagnosis not present

## 2024-05-05 DIAGNOSIS — N5082 Scrotal pain: Secondary | ICD-10-CM | POA: Diagnosis not present

## 2024-05-05 DIAGNOSIS — R0602 Shortness of breath: Secondary | ICD-10-CM | POA: Insufficient documentation

## 2024-05-05 DIAGNOSIS — Z7901 Long term (current) use of anticoagulants: Secondary | ICD-10-CM | POA: Insufficient documentation

## 2024-05-05 DIAGNOSIS — N400 Enlarged prostate without lower urinary tract symptoms: Secondary | ICD-10-CM | POA: Diagnosis not present

## 2024-05-05 DIAGNOSIS — I482 Chronic atrial fibrillation, unspecified: Secondary | ICD-10-CM | POA: Insufficient documentation

## 2024-05-05 DIAGNOSIS — R300 Dysuria: Secondary | ICD-10-CM | POA: Diagnosis present

## 2024-05-05 DIAGNOSIS — I700 Atherosclerosis of aorta: Secondary | ICD-10-CM | POA: Diagnosis not present

## 2024-05-05 LAB — CBC WITH DIFFERENTIAL/PLATELET
Abs Immature Granulocytes: 0.01 K/uL (ref 0.00–0.07)
Basophils Absolute: 0.1 K/uL (ref 0.0–0.1)
Basophils Relative: 1 %
Eosinophils Absolute: 0.2 K/uL (ref 0.0–0.5)
Eosinophils Relative: 3 %
HCT: 40.5 % (ref 39.0–52.0)
Hemoglobin: 12.7 g/dL — ABNORMAL LOW (ref 13.0–17.0)
Immature Granulocytes: 0 %
Lymphocytes Relative: 19 %
Lymphs Abs: 1.4 K/uL (ref 0.7–4.0)
MCH: 27.9 pg (ref 26.0–34.0)
MCHC: 31.4 g/dL (ref 30.0–36.0)
MCV: 89 fL (ref 80.0–100.0)
Monocytes Absolute: 0.7 K/uL (ref 0.1–1.0)
Monocytes Relative: 10 %
Neutro Abs: 4.6 K/uL (ref 1.7–7.7)
Neutrophils Relative %: 67 %
Platelets: 164 K/uL (ref 150–400)
RBC: 4.55 MIL/uL (ref 4.22–5.81)
RDW: 13.9 % (ref 11.5–15.5)
WBC: 7 K/uL (ref 4.0–10.5)
nRBC: 0 % (ref 0.0–0.2)

## 2024-05-05 LAB — URINALYSIS, W/ REFLEX TO CULTURE (INFECTION SUSPECTED)
Bacteria, UA: NONE SEEN
Bilirubin Urine: NEGATIVE
Glucose, UA: NEGATIVE mg/dL
Hgb urine dipstick: NEGATIVE
Ketones, ur: NEGATIVE mg/dL
Leukocytes,Ua: NEGATIVE
Nitrite: NEGATIVE
Protein, ur: NEGATIVE mg/dL
Specific Gravity, Urine: 1.019 (ref 1.005–1.030)
pH: 7 (ref 5.0–8.0)

## 2024-05-05 LAB — PRO BRAIN NATRIURETIC PEPTIDE: Pro Brain Natriuretic Peptide: 225 pg/mL

## 2024-05-05 LAB — COMPREHENSIVE METABOLIC PANEL WITH GFR
ALT: 16 U/L (ref 0–44)
AST: 21 U/L (ref 15–41)
Albumin: 4 g/dL (ref 3.5–5.0)
Alkaline Phosphatase: 66 U/L (ref 38–126)
Anion gap: 12 (ref 5–15)
BUN: 10 mg/dL (ref 8–23)
CO2: 25 mmol/L (ref 22–32)
Calcium: 9 mg/dL (ref 8.9–10.3)
Chloride: 105 mmol/L (ref 98–111)
Creatinine, Ser: 0.91 mg/dL (ref 0.61–1.24)
GFR, Estimated: >60 mL/min
Glucose, Bld: 106 mg/dL — ABNORMAL HIGH (ref 70–99)
Potassium: 3.8 mmol/L (ref 3.5–5.1)
Sodium: 141 mmol/L (ref 135–145)
Total Bilirubin: 0.5 mg/dL (ref 0.0–1.2)
Total Protein: 7 g/dL (ref 6.5–8.1)

## 2024-05-05 LAB — RESP PANEL BY RT-PCR (RSV, FLU A&B, COVID)  RVPGX2
Influenza A by PCR: NEGATIVE
Influenza B by PCR: NEGATIVE
Resp Syncytial Virus by PCR: NEGATIVE
SARS Coronavirus 2 by RT PCR: NEGATIVE

## 2024-05-05 LAB — TROPONIN T, HIGH SENSITIVITY: Troponin T High Sensitivity: 19 ng/L (ref 0–19)

## 2024-05-05 MED ORDER — IOHEXOL 300 MG/ML  SOLN
100.0000 mL | Freq: Once | INTRAMUSCULAR | Status: AC | PRN
  Administered 2024-05-05: 100 mL via INTRAVENOUS

## 2024-05-05 MED ORDER — AZITHROMYCIN 250 MG PO TABS: 250.0000 mg | ORAL_TABLET | Freq: Every day | ORAL | 0 refills | Status: DC

## 2024-05-05 MED ORDER — AZITHROMYCIN 250 MG PO TABS
500.0000 mg | ORAL_TABLET | Freq: Once | ORAL | Status: AC
  Administered 2024-05-05: 500 mg via ORAL
  Filled 2024-05-05: qty 2

## 2024-05-05 NOTE — ED Triage Notes (Signed)
 Per [facility] pt coming from home- c/o penis swelling and redness x 2 days.

## 2024-05-05 NOTE — Discharge Instructions (Signed)
 You were seen in the emergency department for some discomfort in your scrotum and stinging when urinating.  You also had shortness of breath.  You had lab work chest x-ray urinalysis COVID and flu testing that did not show obvious explanation for your symptoms.  You have a lot of allergies to antibiotics so we are starting you on Zithromax  for possible prostate infection.  Will be important for you to follow-up with your primary care doctor and urology.  Return if any worsening or concerning symptoms.

## 2024-05-05 NOTE — ED Provider Notes (Signed)
 "  EMERGENCY DEPARTMENT AT [facility] Provider Note   CSN: [id number] Arrival date & time: 05/05/24  8283     Patient presents with: Groin Swelling (Redness)   [name] [name] is a 83 y.o. male.  He was fairly newly diagnosed with A-fib and is now on Eliquis  and atenolol .  Feels he is having a lot of side effects to this.  He has noticed he is more short of breath especially with exertion over the last few days.  Since last night he has noticed some redness and swelling of his scrotum and penis.  Maybe a little bit of dysuria.  Feels like his abdomen is maybe a little distended.  No fevers or chills.  No chest pain or abdominal pain nausea vomiting diarrhea.  {Add pertinent medical, surgical, social history, OB history to YEP:67052} The history is provided by the patient.  Dysuria Presenting symptoms: dysuria, scrotal pain and swelling   Context: during urination   Relieved by:  Nothing Associated symptoms: scrotal swelling   Associated symptoms: no fever, no genital lesions, no nausea, no urinary retention and no vomiting        Prior to Admission medications  Medication Sig Start Date End Date Taking? Authorizing Provider  albuterol  (PROVENTIL ) (2.5 MG/3ML) 0.083% nebulizer solution Take 3 mLs (2.5 mg total) by nebulization every 6 (six) hours as needed for wheezing or shortness of breath. Patient taking differently: Take 2.5 mg by nebulization 2 (two) times daily. 07/26/23   Darlean Ozell NOVAK, MD  albuterol  (VENTOLIN  HFA) 108 (90 Base) MCG/ACT inhaler Inhale 2 puffs into the lungs every 4 (four) hours as needed for wheezing or shortness of breath. Patient taking differently: Inhale 2 puffs into the lungs as needed for wheezing or shortness of breath. 03/19/23   Ula Prentice SAUNDERS, MD  apixaban  (ELIQUIS ) 5 MG TABS tablet Take 1 tablet (5 mg total) by mouth 2 (two) times daily. 03/29/24   Delford Maude BROCKS, MD  atenolol  (TENORMIN ) 50 MG tablet Take 1 tablet (50 mg total) by  mouth daily. Stop metoprolol  04/20/24   Nishan, Peter C, MD  dronedarone  (MULTAQ ) 400 MG tablet Take 1 tablet (400 mg total) by mouth 2 (two) times daily with a meal. 05/09/24   Delford Maude BROCKS, MD  esomeprazole  (NEXIUM ) 40 MG capsule Take 30-60 min before first meal of the day 09/22/23   Darlean Ozell NOVAK, MD  fluticasone -salmeterol (ADVAIR  HFA) 230-21 MCG/ACT inhaler Take 2 puffs first thing in am and then another 2 puffs about 12 hours later. 10/20/23   Darlean Ozell NOVAK, MD  latanoprost  (XALATAN ) 0.005 % ophthalmic solution Place 1 drop into both eyes at bedtime.    [provider]  loratadine  (CLARITIN ) 10 MG tablet Take 10 mg by mouth daily.    [provider]  LORazepam  (ATIVAN ) 0.5 MG tablet Take 1 tablet (0.5 mg total) by mouth at bedtime as needed. 02/01/24   Cook, Jayce G, DO  lovastatin  (MEVACOR ) 40 MG tablet Take 1 tablet (40 mg total) by mouth daily. 04/02/24   Cook, Jayce G, DO  metroNIDAZOLE  (METROGEL ) 0.75 % gel Apply 1 Application topically daily as needed (rosacea). Patient taking differently: Apply 1 Application topically as needed (rosacea). 06/20/23   [provider]  oxymetazoline  (AFRIN) 0.05 % nasal spray Place 1 spray into both nostrils 2 (two) times daily as needed (Nose bleed). Patient taking differently: Place 1 spray into both nostrils as needed (Nose bleed).    [provider]  Spacer/Aero-Holding [facility] Use as directed 02/17/22   Neda Jennet LABOR, MD  tamsulosin  (FLOMAX ) 0.4 MG CAPS capsule Take 2 capsules (0.8 mg total) by mouth daily. 01/20/24   Cook, Jayce G, DO  triamcinolone  cream (KENALOG ) 0.1 % Apply 1 Application topically 2 (two) times daily as needed (itching). 04/21/23   [provider]    Allergies: Bactrim  [sulfamethoxazole -trimethoprim ], Beta adrenergic blockers, Ciprofloxacin , Doxycycline , Lasix  [furosemide ], Dexamethasone , Gabapentin , Methocarbamol, Myrbetriq  [mirabegron ], Neomycin, Penicillins, and Tetracyclines &  related    Review of Systems  Constitutional:  Negative for fever.  Gastrointestinal:  Negative for nausea and vomiting.  Genitourinary:  Positive for dysuria and scrotal swelling.    Updated Vital Signs BP 130/66   Pulse 66   Temp 97.8 F (36.6 C) (Oral)   Resp 20   Ht 6' 1 (1.854 m)   Wt 95.3 kg   SpO2 98%   BMI 27.71 kg/m   Physical Exam Vitals and nursing note reviewed.  Constitutional:      General: He is not in acute distress.    Appearance: Normal appearance. He is well-developed.  HENT:     Head: Normocephalic and atraumatic.  Eyes:     Conjunctiva/sclera: Conjunctivae normal.  Cardiovascular:     Rate and Rhythm: Normal rate and regular rhythm.     Heart sounds: No murmur heard. Pulmonary:     Effort: Pulmonary effort is normal. No respiratory distress.     Breath sounds: Normal breath sounds.  Abdominal:     Palpations: Abdomen is soft.     Tenderness: There is no abdominal tenderness. There is no guarding or rebound.     Hernia: No hernia is present.  Genitourinary:    Comments: He has some redness to his penis and scrotum.  There is no significant skin thickening or lesions.  No masses appreciated.  Testes themselves are nontender. Musculoskeletal:        General: No swelling.     Cervical back: Neck supple.  Skin:    General: Skin is warm and dry.     Capillary Refill: Capillary refill takes less than 2 seconds.  Neurological:     Mental Status: He is alert.     (all labs ordered are listed, but only abnormal results are displayed) Labs Reviewed  COMPREHENSIVE METABOLIC PANEL WITH GFR - Abnormal; Notable for the following components:      Result Value   Glucose, Bld 106 (*)    All other components within normal limits  CBC WITH DIFFERENTIAL/PLATELET - Abnormal; Notable for the following components:   Hemoglobin 12.7 (*)    All other components within normal limits  RESP PANEL BY RT-PCR (RSV, FLU A&B, COVID)  [id number]  URINALYSIS, W/ REFLEX TO  CULTURE (INFECTION SUSPECTED)  PRO BRAIN NATRIURETIC PEPTIDE  TROPONIN T, HIGH SENSITIVITY    EKG: EKG Interpretation Date/Time:  Saturday May 05 2024 18:01:56 EST Ventricular Rate:  64 PR Interval:  167 QRS Duration:  106 QT Interval:  427 QTC Calculation: 441 R Axis:   48  Text Interpretation: Sinus rhythm Posterior infarct, old Minimal ST depression, lateral leads No significant change since prior 12/25 Confirmed by Towana Sharper [phone]) on 05/05/2024 6:13:36 PM  Radiology: CT PELVIS W CONTRAST Result Date: 05/05/2024 CLINICAL DATA:  Provided history: Hernia suspected, inguinal or femoral scrotal swelling redness EXAM: CT PELVIS WITH CONTRAST TECHNIQUE: Multidetector CT imaging of the pelvis was performed using the standard protocol following the bolus administration of intravenous contrast. RADIATION DOSE REDUCTION: This  exam was performed according to the departmental dose-optimization program which includes automated exposure control, adjustment of the mA and/or kV according to patient size and/or use of iterative reconstruction technique. CONTRAST:  OMNIPAQUE  IOHEXOL  300 MG/ML  SOLN COMPARISON:  Included portions from abdominal CTA 08/29/2023 FINDINGS: Urinary Tract: Physiologically distended urinary bladder without wall thickening Bowel: Sigmoid colonic redundancy. Diffuse colonic diverticulosis without diverticulitis. No inflammation of included small bowel. Breathing motion artifact limits assessment. Vascular/Lymphatic: Aortic atherosclerosis. Prior repair of abdominal aortic aneurysm. Residual aneurysm sac measures 6 cm, unchanged. There is no periaortic stranding. No pelvic adenopathy. Reproductive: Enlarged prostate gland spanning 5.9 cm transverse causes mass effect on the bladder base. No large scrotal hydrocele, fluid collection or soft tissue gas. Other: Right inguinal strandy density is similar to prior exam, related to previous femoral access. No inguinal or  femoral hernia. Minimal fat in the left inguinal canal. No pelvic fluid collection. Musculoskeletal: Lumbosacral fusion hardware. There are no acute or suspicious osseous abnormalities. IMPRESSION: 1. No inguinal or femoral hernia. Minimal fat in the left inguinal canal. 2. Strandy density in the right inguinal region is unchanged from prior exam and related to femoral access site. 3. No obvious scrotal hydrocele, fluid collection or soft tissue gas by CT. Consider testicular ultrasound as clinically indicated 4. Colonic diverticulosis without diverticulitis. 5. Enlarged prostate gland causing mass effect on the bladder base. 6. Repair of abdominal aortic aneurysm. Residual aneurysm sac measures 6 cm, unchanged. Aortic Atherosclerosis (ICD10-I70.0). Electronically Signed   By: Andrea Gasman M.D.   On: 05/05/2024 19:41   DG Chest Port 1 View Result Date: 05/05/2024 EXAM: 2 FRONTAL VIEW(S) XRAY OF THE CHEST 05/05/2024 06:04:00 PM COMPARISON: 03/14/2024 CLINICAL HISTORY: sob FINDINGS: LINES, TUBES AND DEVICES: Wires and leads projecting over the chest. LUNGS AND PLEURA: Diffuse interstitial thickening and coarsening. Hyperinflation. Left hemidiaphragm eventration. No pleural effusion. No pneumothorax. HEART AND MEDIASTINUM: Mild cardiomegaly. Tortuous descending thoracic aorta. Atherosclerotic plaque. BONES AND SOFT TISSUES: No acute osseous abnormality. IMPRESSION: 1. Hyperinflation and interstitial coarsening, likely related to COPD / chronic bronchitis. No superimposed acute process. 2. No superimposed acute process. 3. Aortic atherosclerosis (ICD10-I70.0). Electronically signed by: Rockey Kilts MD 05/05/2024 06:33 PM EST RP Workstation: [id number]    {Document cardiac monitor, telemetry assessment procedure when appropriate:32947} Procedures   Medications Ordered in the ED - No data to display  Clinical Course as of 05/05/24 2024  Sat May 05, 2024  1812 Chest x-ray interpreted by me as cardiomegaly  and elevated left hemidiaphragm.  No clear infiltrate.  Awaiting radiology reading. [MB]    Clinical Course User Index [MB] Towana Ozell BROCKS, MD   {Click here for ABCD2, HEART and other calculators REFRESH Note before signing:1}                              Medical Decision Making Amount and/or Complexity of Data Reviewed Labs: ordered. Radiology: ordered.   This patient complains of ***; this involves an extensive number of treatment Options and is a complaint that carries with it a high risk of complications and morbidity. The differential includes ***  I ordered, reviewed and interpreted labs, which included *** I ordered medication *** and reviewed PMP when indicated. I ordered imaging studies which included *** and I independently    visualized and interpreted imaging which showed *** Additional history obtained from *** Previous records obtained and reviewed *** I consulted *** and discussed lab and imaging  findings and discussed disposition.  Cardiac monitoring reviewed, *** Social determinants considered, *** Critical Interventions: ***  After the interventions stated above, I reevaluated the patient and found *** Admission and further testing considered, ***   {Document critical care time when appropriate  Document review of labs and clinical decision tools ie CHADS2VASC2, etc  Document your independent review of radiology images and any outside records  Document your discussion with family members, caretakers and with consultants  Document social determinants of health affecting pt's care  Document your decision making why or why not admission, treatments were needed:32947:::1}   Final diagnoses:  None    ED Discharge Orders     None        "

## 2024-05-07 ENCOUNTER — Other Ambulatory Visit: Payer: Self-pay | Admitting: Pulmonary Disease

## 2024-05-07 ENCOUNTER — Encounter: Payer: Self-pay | Admitting: Pulmonary Disease

## 2024-05-07 MED ORDER — AZITHROMYCIN 250 MG PO TABS: ORAL_TABLET | ORAL | 0 refills | Status: DC

## 2024-05-07 MED ORDER — ALBUTEROL SULFATE HFA 108 (90 BASE) MCG/ACT IN AERS: 2.0000 | INHALATION_SPRAY | RESPIRATORY_TRACT | 0 refills | Status: AC | PRN

## 2024-05-07 NOTE — Telephone Encounter (Signed)
 addressed

## 2024-05-13 ENCOUNTER — Encounter: Payer: Self-pay | Admitting: Cardiology

## 2024-05-15 ENCOUNTER — Ambulatory Visit (INDEPENDENT_AMBULATORY_CARE_PROVIDER_SITE_OTHER): Admitting: Family Medicine

## 2024-05-15 VITALS — BP 120/67 | HR 65 | Temp 97.5°F | Ht 73.0 in | Wt 211.4 lb

## 2024-05-15 DIAGNOSIS — H9209 Otalgia, unspecified ear: Secondary | ICD-10-CM | POA: Insufficient documentation

## 2024-05-15 DIAGNOSIS — G4700 Insomnia, unspecified: Secondary | ICD-10-CM | POA: Diagnosis not present

## 2024-05-15 DIAGNOSIS — H9202 Otalgia, left ear: Secondary | ICD-10-CM | POA: Diagnosis not present

## 2024-05-15 MED ORDER — LORAZEPAM 1 MG PO TABS: 1.0000 mg | ORAL_TABLET | Freq: Every evening | ORAL | 0 refills | Status: AC | PRN

## 2024-05-15 MED ORDER — CEFDINIR 300 MG PO CAPS: 300.0000 mg | ORAL_CAPSULE | Freq: Two times a day (BID) | ORAL | 0 refills | Status: AC

## 2024-05-15 NOTE — Assessment & Plan Note (Signed)
 No evidence of otitis media.  Concern for referred pain from sinusitis.  Discussed the potential for CT imaging of the sinuses.  Placing him on Omnicef .

## 2024-05-15 NOTE — Patient Instructions (Signed)
 Meds sent in.  Message with concerns.  Take care  Dr. Bluford

## 2024-05-15 NOTE — Progress Notes (Signed)
 "  Subjective:  Patient ID: [name] [name], male    DOB: 1940-10-16  Age: 83 y.o. MRN: [id number]  CC:   Chief Complaint  Patient presents with   Follow-up    Patient is here for a hospital follow up  Patient is having ear ache pain for the past 2 weeks.  He has been seen for the same problem few weeks ago and there was not an infection.  He is having difficulty hearing as well    HPI:  83 year old male presents for follow-up.  Patient reports that he is having ear pain.  This has been ongoing for the past couple of weeks.  He was seen by ENT on 12/16.  Had a normal exam at that time.  He states that he continues to have left ear pain.  Severe at times.  He feels like he is having sinus pressure and congestion particularly on the left side.  Was recently given azithromycin  in the ER but continues to have symptoms.  Additionally, patient states that he is experiencing insomnia.  He states that this has been an issue since starting Eliquis .  This is not a known side effect of Eliquis .  He would like to discuss increasing Ativan  to help with sleep.  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   DOE (dyspnea on exertion) 10/20/2023   Cervical radiculopathy 08/15/2023   BPH associated with nocturia 05/24/2023   COPD GOLD 3/ AB 05/05/2023   Celiac artery stenosis 02/02/2023   Chronic idiopathic constipation 12/21/2022   Bilateral primary osteoarthritis of knee 04/14/2022   Irritable bowel syndrome 03/17/2022   Seasonal allergies 08/06/2021   BPH (benign prostatic hyperplasia) 04/23/2021   CAD (coronary artery disease) 04/23/2021   Epistaxis 04/23/2021   Essential hypertension 09/16/2016   Lumbar  spondylosis 11/05/2014   GERD (gastroesophageal reflux disease) 01/19/2012   Mixed hyperlipidemia 04/15/2008   AAA (abdominal aortic aneurysm) 04/15/2008    Social Hx   Social History   Socioeconomic History   Marital status: Widowed    Spouse name: Not on file   Number of children: Not on file   Years of education: Not on file   Highest education level: Not on file  Occupational History   Not on file  Tobacco Use   Smoking status: Former    Current packs/day: 0.00    Average packs/day: 1 pack/day for 35.0 years (35.0 ttl pk-yrs)    Types: Cigarettes    Start date: 03/27/1968    Quit date: 03/28/2003    Years since quitting: 21.1   Smokeless tobacco: Never  Vaping Use   Vaping status: Never Used  Substance and Sexual Activity   Alcohol use: No    Alcohol/week: 0.0 standard drinks of alcohol   Drug use: No   Sexual activity: Not Currently    Birth control/protection: None  Other Topics Concern   Not on file  Social History Narrative   Not on file   Social Drivers of Health   Tobacco Use: Medium Risk (05/05/2024)   Patient History    Smoking Tobacco Use: Former    Smokeless Tobacco Use: Never    Passive Exposure: Not on file  Financial Resource Strain: Low Risk (03/18/2022)  Overall Financial Resource Strain (CARDIA)    Difficulty of Paying Living Expenses: Not very hard  Food Insecurity: Low Risk (03/05/2024)   Received from [facility]   Epic    Within the past 12 months, you worried that your food would run out before you got money to buy more: Never true    Within the past 12 months, the food you bought just didn't last and you didn't have money to get more. : Never true  Transportation Needs: No Transportation Needs (03/05/2024)   Received from [facility]    In the past 12 months, has lack of reliable transportation kept you from medical appointments, meetings, work or from getting things needed for daily living? : No  Physical  Activity: Insufficiently Active (03/18/2022)   Exercise Vital Sign    Days of Exercise per Week: 2 days    Minutes of Exercise per Session: 20 min  Stress: No Stress Concern Present (03/18/2022)   [employer] of Occupational Health - Occupational Stress Questionnaire    Feeling of Stress : Not at all  Social Connections: Socially Isolated (02/05/2024)   Social Connection and Isolation Panel    Frequency of Communication with Friends and Family: More than three times a week    Frequency of Social Gatherings with Friends and Family: Three times a week    Attends Religious Services: Never    Active Member of Clubs or Organizations: No    Attends Banker Meetings: Never    Marital Status: Widowed  Depression (PHQ2-9): Medium Risk (05/15/2024)   Depression (PHQ2-9)    PHQ-2 Score: 7  Alcohol Screen: Low Risk (03/18/2022)   Alcohol Screen    Last Alcohol Screening Score (AUDIT): 0  Housing: Low Risk (03/05/2024)   Received from [facility]   Epic    What is your living situation today?: I have a steady place to live    Think about the place you live. Do you have problems with any of the following? Choose all that apply:: None/None on this list  Utilities: Low Risk (03/05/2024)   Received from [facility]   Utilities    In the past 12 months has the electric, gas, oil, or water company threatened to shut off services in your home? : No  Health Literacy: Not on file    Review of Systems Per HPI  Objective:  BP 120/67 (BP Location: Left Arm, Patient Position: Sitting)   Pulse 65   Temp (!) 97.5 F (36.4 C)   Ht 6' 1 (1.854 m)   Wt 211 lb 6 oz (95.9 kg)   SpO2 97%   BMI 27.89 kg/m      05/15/2024    9:52 AM 05/05/2024    6:45 PM 05/05/2024    5:32 PM  BP/Weight  Systolic BP 120 116 130  Diastolic BP 67 74 66  Wt. (Lbs) 211.38  210  BMI 27.89 kg/m2  27.71 kg/m2    Physical Exam Vitals and nursing note reviewed.  Constitutional:      General: He  is not in acute distress.    Appearance: Normal appearance.  HENT:     Head: Normocephalic and atraumatic.     Right Ear: Tympanic membrane normal.     Left Ear: Tympanic membrane normal.  Cardiovascular:     Rate and Rhythm: Normal rate and regular rhythm.  Pulmonary:     Effort: Pulmonary effort is normal.     Breath sounds: Normal breath  sounds. No wheezing or rales.  Neurological:     Mental Status: He is alert.     Lab Results  Component Value Date   WBC 7.0 05/05/2024   HGB 12.7 (L) 05/05/2024   HCT 40.5 05/05/2024   PLT 164 05/05/2024   GLUCOSE 106 (H) 05/05/2024   CHOL 134 08/30/2023   TRIG 115 08/30/2023   HDL 45 08/30/2023   LDLCALC 68 08/30/2023   ALT 16 05/05/2024   AST 21 05/05/2024   NA 141 05/05/2024   K 3.8 05/05/2024   CL 105 05/05/2024   CREATININE 0.91 05/05/2024   BUN 10 05/05/2024   CO2 25 05/05/2024   TSH 1.449 02/04/2024   PSA 1.80 07/26/2014   INR 1.0 02/04/2024   HGBA1C 6.0 (H) 08/30/2023     Assessment & Plan:  Left ear pain Assessment & Plan: No evidence of otitis media.  Concern for referred pain from sinusitis.  Discussed the potential for CT imaging of the sinuses.  Placing him on Omnicef .  Orders: -     Cefdinir ; Take 1 capsule (300 mg total) by mouth 2 (two) times daily.  Dispense: 20 capsule; Refill: 0  Insomnia, unspecified type Assessment & Plan: Recent worsening/exacerbation.  Increasing Ativan .  We discussed tensional side effects of this medication and that it is not recommended in the elderly.  He is okay with proceeding.  Orders: -     LORazepam ; Take 1 tablet (1 mg total) by mouth at bedtime as needed for sleep.  Dispense: 30 tablet; Refill: 0    Follow-up:  Return if symptoms worsen or fail to improve.  Jacqulyn Ahle DO [facility] "

## 2024-05-15 NOTE — Assessment & Plan Note (Signed)
 Recent worsening/exacerbation.  Increasing Ativan .  We discussed tensional side effects of this medication and that it is not recommended in the elderly.  He is okay with proceeding.

## 2024-05-23 ENCOUNTER — Telehealth: Payer: Self-pay

## 2024-05-23 NOTE — Telephone Encounter (Signed)
 Copied from CRM [id number]. Topic: General - Call Back - No Documentation >> May 21, 2024  9:56 AM Nessti S wrote: Reason for CRM: pt returning call to office. He thinks it may be about the medicare awv that he canceled on mychart. He would like call back [phone]

## 2024-06-01 ENCOUNTER — Ambulatory Visit

## 2024-06-07 ENCOUNTER — Telehealth: Payer: Self-pay

## 2024-06-07 ENCOUNTER — Ambulatory Visit (INDEPENDENT_AMBULATORY_CARE_PROVIDER_SITE_OTHER): Admitting: Family Medicine

## 2024-06-07 ENCOUNTER — Encounter: Payer: Self-pay | Admitting: Family Medicine

## 2024-06-07 VITALS — BP 111/63 | HR 75 | Temp 97.9°F | Ht 73.0 in | Wt 207.0 lb

## 2024-06-07 DIAGNOSIS — R350 Frequency of micturition: Secondary | ICD-10-CM

## 2024-06-07 DIAGNOSIS — N401 Enlarged prostate with lower urinary tract symptoms: Secondary | ICD-10-CM

## 2024-06-07 DIAGNOSIS — B379 Candidiasis, unspecified: Secondary | ICD-10-CM

## 2024-06-07 LAB — POCT URINALYSIS DIP (CLINITEK)
Bilirubin, UA: NEGATIVE
Blood, UA: NEGATIVE
Glucose, UA: NEGATIVE mg/dL
Ketones, POC UA: NEGATIVE mg/dL
Leukocytes, UA: NEGATIVE
Nitrite, UA: NEGATIVE
POC PROTEIN,UA: NEGATIVE
Spec Grav, UA: <=1.005 — AB
Urobilinogen, UA: 0.2 U/dL
pH, UA: 5

## 2024-06-07 MED ORDER — FLUCONAZOLE 150 MG PO TABS: 150.0000 mg | ORAL_TABLET | Freq: Once | ORAL | 0 refills | Status: AC

## 2024-06-07 NOTE — Assessment & Plan Note (Signed)
 I believe the patient's symptoms are coming from BPH.  Continue Flomax .  Follow-up with urology.

## 2024-06-07 NOTE — Assessment & Plan Note (Signed)
 Suspect underlying yeast.  Placing on Diflucan .

## 2024-06-07 NOTE — Telephone Encounter (Signed)
-----   Message from Nurse Doreatha BROCKS, RN sent at 05/03/2024  8:55 AM EST ----- Regarding: 2/20 afib ablation  Precert:  MD: Camnitz Type of ablation: A-fib Diagnosis: afib CPT code: A-fib (06343) Ablation scheduled (date/time): 2/20 at 730  Procedure:  Added to calendar? Yes Orders entered? Yes Letter complete? No, >30 days before procedure Scheduled with cath lab? Yes Any medications to hold? No Labs ordered (CBC, BMET, PT/INR if on warfarin): Yes Mapping system: Doesn't matter CARTO/OPAL rep notified? No Cardiac CT needed? No Dye allergy? No Pre-meds ordered and instructions given? No, not needed Letter method: MyChart H&P: 12/3 Device: No  Follow-up:  Cassie/Angel, please schedule Routine.  Covering RN - please send this message to [employer], EP scheduler, EP Scheduling pool, EP [employer], and CT scheduler (Brittany Lynch/Stephanie Mogg), if indicated.

## 2024-06-07 NOTE — Progress Notes (Signed)
 "  Subjective:  Patient ID: JAWUAN ROBB, male    DOB: September 03, 1940  Age: 84 y.o. MRN: [id number]  CC:   Chief Complaint  Patient presents with   possible yeast     Red, itching, chills, trouble urinating small amounts    HPI:  84 year old male presents for evaluation of the above.  Ongoing issues with scrotal irritation, redness, itching.  Also affects the penis as well.  He has been using topical antifungals without improvement.  Patient also reports that he is having difficulty urinating.  He states that he is trying small amounts.  Has had some dysuria as well.  Had incontinence here.  Follows with urology.  He is on Flomax .  Patient Active Problem List   Diagnosis Date Noted   Yeast infection 06/07/2024   Insomnia 05/15/2024   Paroxysmal A-fib (HCC) 03/11/2024   Abdominal pain, chronic, right lower quadrant 02/02/2024   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   DOE (dyspnea on exertion) 10/20/2023   Cervical radiculopathy 08/15/2023   BPH associated with nocturia 05/24/2023   COPD GOLD 3/ AB 05/05/2023   Celiac artery stenosis 02/02/2023   Chronic idiopathic constipation 12/21/2022   Bilateral primary osteoarthritis of knee 04/14/2022   Irritable bowel syndrome 03/17/2022   Seasonal allergies 08/06/2021   BPH (benign prostatic hyperplasia) 04/23/2021   CAD (coronary artery disease) 04/23/2021   Epistaxis 04/23/2021   Essential hypertension 09/16/2016   Lumbar spondylosis 11/05/2014   GERD (gastroesophageal reflux disease) 01/19/2012   Mixed hyperlipidemia 04/15/2008   AAA (abdominal aortic aneurysm) 04/15/2008    Social Hx   Social History   Socioeconomic History   Marital status: Widowed    Spouse name: Not on file   Number of children: Not on file   Years of education: Not on file   Highest education level: Not on file  Occupational History   Not on file  Tobacco Use   Smoking status: Former    Current packs/day: 0.00    Average packs/day: 1  pack/day for 35.0 years (35.0 ttl pk-yrs)    Types: Cigarettes    Start date: 03/27/1968    Quit date: 03/28/2003    Years since quitting: 21.2   Smokeless tobacco: Never  Vaping Use   Vaping status: Never Used  Substance and Sexual Activity   Alcohol use: No    Alcohol/week: 0.0 standard drinks of alcohol   Drug use: No   Sexual activity: Not Currently    Birth control/protection: None  Other Topics Concern   Not on file  Social History Narrative   Not on file   Social Drivers of Health   Tobacco Use: Medium Risk (06/07/2024)   Patient History    Smoking Tobacco Use: Former    Smokeless Tobacco Use: Never    Passive Exposure: Not on file  Financial Resource Strain: Low Risk (03/18/2022)   Overall Financial Resource Strain (CARDIA)    Difficulty of Paying Living Expenses: Not very hard  Food Insecurity: Low Risk (03/05/2024)   Received from [facility]   Epic    Within the past 12 months, you worried that your food would run out before you got money to buy more: Never true    Within the past 12 months, the food you bought just didn't last and you didn't have money to get more. : Never true  Transportation Needs: No Transportation Needs (03/05/2024)   Received from [facility]    In the past 12 months, has  lack of reliable transportation kept you from medical appointments, meetings, work or from getting things needed for daily living? : No  Physical Activity: Insufficiently Active (03/18/2022)   Exercise Vital Sign    Days of Exercise per Week: 2 days    Minutes of Exercise per Session: 20 min  Stress: No Stress Concern Present (03/18/2022)   [employer] of Occupational Health - Occupational Stress Questionnaire    Feeling of Stress : Not at all  Social Connections: Socially Isolated (02/05/2024)   Social Connection and Isolation Panel    Frequency of Communication with Friends and Family: More than three times a week    Frequency of Social  Gatherings with Friends and Family: Three times a week    Attends Religious Services: Never    Active Member of Clubs or Organizations: No    Attends Banker Meetings: Never    Marital Status: Widowed  Depression (PHQ2-9): Medium Risk (05/15/2024)   Depression (PHQ2-9)    PHQ-2 Score: 7  Alcohol Screen: Low Risk (03/18/2022)   Alcohol Screen    Last Alcohol Screening Score (AUDIT): 0  Housing: Low Risk (03/05/2024)   Received from [facility]   Epic    What is your living situation today?: I have a steady place to live    Think about the place you live. Do you have problems with any of the following? Choose all that apply:: None/None on this list  Utilities: Low Risk (03/05/2024)   Received from [facility]   Utilities    In the past 12 months has the electric, gas, oil, or water company threatened to shut off services in your home? : No  Health Literacy: Not on file    Review of Systems Per HPI  Objective:  BP 111/63   Pulse 75   Temp 97.9 F (36.6 C)   Ht 6' 1 (1.854 m)   Wt 207 lb (93.9 kg)   SpO2 96%   BMI 27.31 kg/m      06/07/2024   10:57 AM 05/15/2024    9:52 AM 05/05/2024    6:45 PM  BP/Weight  Systolic BP 111 120 116  Diastolic BP 63 67 74  Wt. (Lbs) 207 211.38   BMI 27.31 kg/m2 27.89 kg/m2     Physical Exam Vitals and nursing note reviewed.  Constitutional:      General: He is not in acute distress.    Appearance: Normal appearance.  HENT:     Head: Normocephalic and atraumatic.  Pulmonary:     Effort: Pulmonary effort is normal. No respiratory distress.  Genitourinary:    Comments: Scrotum with redness and irritation.  There is also redness and dryness in the penis. Neurological:     Mental Status: He is alert.     Lab Results  Component Value Date   WBC 7.0 05/05/2024   HGB 12.7 (L) 05/05/2024   HCT 40.5 05/05/2024   PLT 164 05/05/2024   GLUCOSE 106 (H) 05/05/2024   CHOL 134 08/30/2023   TRIG 115 08/30/2023   HDL 45  08/30/2023   LDLCALC 68 08/30/2023   ALT 16 05/05/2024   AST 21 05/05/2024   NA 141 05/05/2024   K 3.8 05/05/2024   CL 105 05/05/2024   CREATININE 0.91 05/05/2024   BUN 10 05/05/2024   CO2 25 05/05/2024   TSH 1.449 02/04/2024   PSA 1.80 07/26/2014   INR 1.0 02/04/2024   HGBA1C 6.0 (H) 08/30/2023     Assessment &  Plan:  Yeast infection Assessment & Plan: Suspect underlying yeast.  Placing on Diflucan .  Orders: -     Fluconazole ; Take 1 tablet (150 mg total) by mouth once for 1 dose. Repeat dose in 72 hours.  Dispense: 2 tablet; Refill: 0  Urine frequency -     POCT URINALYSIS DIP (CLINITEK)  Benign prostatic hyperplasia with lower urinary tract symptoms, symptom details unspecified Assessment & Plan: I believe the patient's symptoms are coming from BPH.  Continue Flomax .  Follow-up with urology.     Follow-up:  Return in about 3 months (around 09/05/2024).  Jacqulyn Ahle DO [facility] "

## 2024-06-07 NOTE — Patient Instructions (Signed)
 Urine was clear.  Reach out to Urology.  Medication as prescribed.

## 2024-06-08 ENCOUNTER — Encounter: Payer: Self-pay | Admitting: Urology

## 2024-06-08 ENCOUNTER — Ambulatory Visit: Admitting: Urology

## 2024-06-08 ENCOUNTER — Telehealth (HOSPITAL_COMMUNITY): Payer: Self-pay

## 2024-06-08 ENCOUNTER — Encounter (HOSPITAL_COMMUNITY): Payer: Self-pay

## 2024-06-08 VITALS — BP 99/61 | HR 63

## 2024-06-08 DIAGNOSIS — N401 Enlarged prostate with lower urinary tract symptoms: Secondary | ICD-10-CM

## 2024-06-08 DIAGNOSIS — N411 Chronic prostatitis: Secondary | ICD-10-CM | POA: Diagnosis not present

## 2024-06-08 DIAGNOSIS — R351 Nocturia: Secondary | ICD-10-CM

## 2024-06-08 LAB — URINALYSIS, ROUTINE W REFLEX MICROSCOPIC
Bilirubin, UA: NEGATIVE
Glucose, UA: NEGATIVE
Ketones, UA: NEGATIVE
Leukocytes,UA: NEGATIVE
Nitrite, UA: NEGATIVE
Protein,UA: NEGATIVE
RBC, UA: NEGATIVE
Specific Gravity, UA: 1.015 (ref 1.005–1.030)
Urobilinogen, Ur: 1 mg/dL (ref 0.2–1.0)
pH, UA: 6 (ref 5.0–7.5)

## 2024-06-08 MED ORDER — FLUCONAZOLE 100 MG PO TABS: 100.0000 mg | ORAL_TABLET | Freq: Every day | ORAL | 0 refills | Status: AC

## 2024-06-08 MED ORDER — NYSTATIN-TRIAMCINOLONE 100000-0.1 UNIT/GM-% EX OINT: 1.0000 | TOPICAL_OINTMENT | Freq: Two times a day (BID) | CUTANEOUS | 1 refills | Status: AC

## 2024-06-08 NOTE — Patient Instructions (Signed)

## 2024-06-08 NOTE — Progress Notes (Unsigned)
 "  06/08/2024 12:12 PM   [name] [name] Feb 23, 1941 [id number]  Referring provider: Cook, Jayce G, DO [address] [place],  [place] [address]  Followup BPH   HPI: Mr Rosol is a 84yo here for followup for BPH and chronic prostatitis. He developed scrotal irritation September which then worsened in December and he started taking clotrimazole  which made is irritation worse. He was then given rx for diflucan  and after 1 dose his itching of his scrotum better. He noted bleeding from his scrotum this morning. He is on eliquis  for afib. IPSS 12 QOL 4 on flomax  0.4mg  BID.   PMH: Past Medical History:  Diagnosis Date   AAA (abdominal aortic aneurysm)    needs yearly ultrasound   Allergy    Anemia    Arthritis    Asthma    BCC (basal cell carcinoma) 08/18/1989   left shoulder blad, upper right arm, left arm beyond elbow, c&d   BCC (basal cell carcinoma) 01/31/1992   Posterior neck, curetx3, 75fu   BCC (basal cell carcinoma) 11/22/2001   mid forehead, cx3, excision, right forearm, cx3, 85fu   BCC (basal cell carcinoma) 10/09/2003   mid forehead, MOHs   BCC (basal cell carcinoma) 08/15/2008   upper left back, biopsy   BPH (benign prostatic hyperplasia)    CAD (coronary artery disease)    Cancer (HCC)    skin cancer   COPD (chronic obstructive pulmonary disease) (HCC)    Dysrhythmia    pt. states it can be fast at times   GERD (gastroesophageal reflux disease)    Glaucoma    History of acute pancreatitis 12/21/2022   HOH (hard of hearing)    Hypercholesterolemia    Hypertension    Impaired fasting glucose    Low back pain    Melanoma in situ (HCC) 10/09/2003   left chin, MOHs   MI (myocardial infarction) (HCC) 1999   Peripheral vascular disease    AAA   SCC (squamous cell carcinoma) 07/03/2014   in situ, behind left ear, cx3, cautery, 29fu   SCC (squamous cell carcinoma) 07/03/2014   well diff, left forearm, biopsy, cx1, cautery   SCC (squamous cell carcinoma) 07/20/2017    in situ, left upper arm, cx3, 41fu   SCC (squamous cell carcinoma) 01/10/2019   in situ, left post shoulder, cx3, 22fu   SCC (squamous cell carcinoma) 11/22/2001   left forearm distal, left forearm, cx3, 63fu   SCC (squamous cell carcinoma) 10/09/2003   [place], left ear post, clear per st, right cheek clear   SCC (squamous cell carcinoma) 03/30/2004   in situ, left upper arm, cx3, 63fu   SCC (squamous cell carcinoma) 03/08/2005   in situ, right cheek, mid upper forehead, cx3, 23fu   SCC (squamous cell carcinoma) 06/08/2006   in situ, left shoulder, cx3, 89fu   SCC (squamous cell carcinoma) 05/05/2010   right inner wrist, biopsy   SCC (squamous cell carcinoma) 09/13/2013   in situ, right crown scalp, front scalp, biopsy   Thrush     Surgical History: Past Surgical History:  Procedure Laterality Date   ABDOMINAL AORTIC ENDOVASCULAR STENT GRAFT N/A 07/29/2023   Procedure: INSERTION, ENDOVASCULAR STENT GRAFT, AORTA, ABDOMINAL;  Surgeon: Gretta Lonni PARAS, MD;  Location: [facility] OR;  Service: Vascular;  Laterality: N/A;   BACK SURGERY     x 3   CARDIAC CATHETERIZATION     angioplasty   CATARACT EXTRACTION W/PHACO  03/20/2012   Procedure: CATARACT EXTRACTION PHACO AND  INTRAOCULAR LENS PLACEMENT (IOC);  Surgeon: Dow JULIANNA Burke, MD;  Location: [facility];  Service: Ophthalmology;  Laterality: Right;  CDE:  8.45   CATARACT EXTRACTION W/PHACO Left 04/02/2013   Procedure: CATARACT EXTRACTION PHACO AND INTRAOCULAR LENS PLACEMENT (IOC);  Surgeon: Dow JULIANNA Burke, MD;  Location: [facility];  Service: Ophthalmology;  Laterality: Left;  CDE:  6.50   CHOLECYSTECTOMY  2000   COLONOSCOPY  2009   repeat 5 years   ESOPHAGOGASTRODUODENOSCOPY     HERNIA REPAIR Left    inguinal   INGUINAL HERNIA REPAIR Right 03/28/2020   Procedure: Right Inguinal Herniorrhaphy with Mesh;  Surgeon: Mavis Anes, MD;  Location: [facility];  Service: General;  Laterality: Right;   LAPAROSCOPIC PARTIAL COLECTOMY N/A 06/11/2013    Procedure: LAPAROSCOPIC HAND ASSISTED PARTIAL COLECTOMY;  Surgeon: Anes DELENA Mavis, MD;  Location: [facility];  Service: General;  Laterality: N/A;   NASAL ENDOSCOPY WITH EPISTAXIS CONTROL Bilateral 02/11/2020   Procedure: NASAL ENDOSCOPY WITH EPISTAXIS CONTROL;  Surgeon: Karis Clunes, MD;  Location: [facility];  Service: ENT;  Laterality: Bilateral;   NASAL ENDOSCOPY WITH EPISTAXIS CONTROL Right 02/15/2024   Procedure: CONTROL OF EPISTAXIS RIGHT SIDE;  Surgeon: Carlie Clark, MD;  Location: [facility] OR;  Service: ENT;  Laterality: Right;   NASAL SINUS SURGERY Bilateral 02/15/2024   Procedure: BILATERAL NASAL ENDOSCOPY W/ CAUTERY AND LEFT SPHENOPALATINE CLIPPING;  Surgeon: Carlie Clark, MD;  Location: [facility] OR;  Service: ENT;  Laterality: Bilateral;  BILATERAL NASAL ENDOSCOPY W/ CAUTERY AND LEFT SPHENOPALATINE CLIPPING   REPAIR ILIAC ARTERY Right 07/29/2023   Procedure: REPAIR, ARTERY, FEMORAL;  Surgeon: Gretta Lonni PARAS, MD;  Location: [facility] OR;  Service: Vascular;  Laterality: Right;   right eye detached retina Bilateral    SEPTOPLASTY Left 02/15/2024   Procedure: SEPTOPLASTY, NOSE;  Surgeon: Carlie Clark, MD;  Location: [facility] OR;  Service: ENT;  Laterality: Left;   SPINAL FUSION  2016   ULTRASOUND GUIDANCE FOR VASCULAR ACCESS Bilateral 07/29/2023   Procedure: ULTRASOUND GUIDANCE, FOR VASCULAR ACCESS;  Surgeon: Gretta Lonni PARAS, MD;  Location: [facility] OR;  Service: Vascular;  Laterality: Bilateral;   YAG LASER APPLICATION Left 05/06/2014   Procedure: YAG LASER APPLICATION;  Surgeon: Dow JULIANNA Burke, MD;  Location: [facility];  Service: Ophthalmology;  Laterality: Left;    Home Medications:  Allergies as of 06/08/2024       Reactions   Bactrim  [sulfamethoxazole -trimethoprim ] Hives   Beta Adrenergic Blockers Diarrhea   Currently taking   Ciprofloxacin  Nausea And Vomiting, Rash, Other (See Comments)   Body aches   Doxycycline  Swelling   Lips swelling and skin peeling around mouth   Lasix  [furosemide ]  Rash   Dexamethasone  Swelling   Gabapentin  Swelling   Legs and feet swelling   Methocarbamol Swelling, Rash   Myrbetriq  [mirabegron ] Hives, Itching   Neomycin Swelling, Rash   Penicillins Rash, Swelling, Dermatitis   Tetracyclines & Related Itching        Medication List        Accurate as of June 08, 2024 12:12 PM. If you have any questions, ask your nurse or doctor.          albuterol  (2.5 MG/3ML) 0.083% nebulizer solution Commonly known as: PROVENTIL  Take 3 mLs (2.5 mg total) by nebulization every 6 (six) hours as needed for wheezing or shortness of breath. What changed: when to take this   albuterol  108 (90 Base) MCG/ACT inhaler Commonly known as: VENTOLIN  HFA Inhale 2 puffs into the lungs every 4 (  four) hours as needed for wheezing or shortness of breath. What changed: Another medication with the same name was changed. Make sure you understand how and when to take each.   apixaban  5 MG Tabs tablet Commonly known as: ELIQUIS  Take 1 tablet (5 mg total) by mouth 2 (two) times daily.   atenolol  50 MG tablet Commonly known as: TENORMIN  Take 1 tablet (50 mg total) by mouth daily. Stop metoprolol    cefdinir  300 MG capsule Commonly known as: OMNICEF  Take 1 capsule (300 mg total) by mouth 2 (two) times daily.   dronedarone  400 MG tablet Commonly known as: MULTAQ  Take 1 tablet (400 mg total) by mouth 2 (two) times daily with a meal.   esomeprazole  40 MG capsule Commonly known as: NexIUM  Take 30-60 min before first meal of the day   fluconazole  150 MG tablet Commonly known as: DIFLUCAN  Take 150 mg by mouth once.   fluticasone -salmeterol 230-21 MCG/ACT inhaler Commonly known as: Advair  HFA Take 2 puffs first thing in am and then another 2 puffs about 12 hours later.   latanoprost  0.005 % ophthalmic solution Commonly known as: XALATAN  Place 1 drop into both eyes at bedtime.   loratadine  10 MG tablet Commonly known as: CLARITIN  Take 10 mg by mouth daily.    LORazepam  1 MG tablet Commonly known as: ATIVAN  Take 1 tablet (1 mg total) by mouth at bedtime as needed for sleep.   lovastatin  40 MG tablet Commonly known as: MEVACOR  Take 1 tablet (40 mg total) by mouth daily.   metroNIDAZOLE  0.75 % gel Commonly known as: METROGEL  Apply 1 Application topically daily as needed (rosacea). What changed: when to take this   oxymetazoline  0.05 % nasal spray Commonly known as: AFRIN Place 1 spray into both nostrils 2 (two) times daily as needed (Nose bleed). What changed: when to take this   Spacer/Aero-Holding Raguel French Use as directed   tamsulosin  0.4 MG Caps capsule Commonly known as: FLOMAX  Take 2 capsules (0.8 mg total) by mouth daily.   triamcinolone  cream 0.1 % Commonly known as: KENALOG  Apply 1 Application topically 2 (two) times daily as needed (itching).        Allergies: Allergies[1]  Family History: Family History  Problem Relation Age of Onset   Hypertension Mother    COPD Father    Cancer Brother        brain    Social History:  reports that he quit smoking about 21 years ago. His smoking use included cigarettes. He started smoking about 56 years ago. He has a 35 pack-year smoking history. He has never used smokeless tobacco. He reports that he does not drink alcohol and does not use drugs.  ROS: All other review of systems were reviewed and are negative except what is noted above in HPI  Physical Exam: BP 99/61   Pulse 63   Constitutional:  Alert and oriented, No acute distress. HEENT: [place] AT, moist mucus membranes.  Trachea midline, no masses. Cardiovascular: No clubbing, cyanosis, or edema. Respiratory: Normal respiratory effort, no increased work of breathing. GI: Abdomen is soft, nontender, nondistended, no abdominal masses GU: No CVA tenderness.  Lymph: No cervical or inguinal lymphadenopathy. Skin: No rashes, bruises or suspicious lesions. Neurologic: Grossly intact, no focal deficits, moving all 4  extremities. Psychiatric: Normal mood and affect.  Laboratory Data: Lab Results  Component Value Date   WBC 7.0 05/05/2024   HGB 12.7 (L) 05/05/2024   HCT 40.5 05/05/2024   MCV 89.0 05/05/2024  PLT 164 05/05/2024    Lab Results  Component Value Date   CREATININE 0.91 05/05/2024    Lab Results  Component Value Date   PSA 1.80 07/26/2014   PSA 1.42 03/26/2013    No results found for: TESTOSTERONE  Lab Results  Component Value Date   HGBA1C 6.0 (H) 08/30/2023    Urinalysis    Component Value Date/Time   COLORURINE YELLOW 05/05/2024 1945   APPEARANCEUR CLEAR 05/05/2024 1945   APPEARANCEUR Clear 08/05/2023 1341   LABSPEC 1.019 05/05/2024 1945   PHURINE 7.0 05/05/2024 1945   GLUCOSEU NEGATIVE 05/05/2024 1945   HGBUR NEGATIVE 05/05/2024 1945   BILIRUBINUR negative 06/07/2024 1133   BILIRUBINUR Negative 08/05/2023 1341   KETONESUR negative 06/07/2024 1133   KETONESUR NEGATIVE 05/05/2024 1945   PROTEINUR NEGATIVE 05/05/2024 1945   UROBILINOGEN 0.2 06/07/2024 1133   UROBILINOGEN 0.2 12/04/2014 0240   NITRITE Negative 06/07/2024 1133   NITRITE NEGATIVE 05/05/2024 1945   LEUKOCYTESUR Negative 06/07/2024 1133   LEUKOCYTESUR NEGATIVE 05/05/2024 1945    Lab Results  Component Value Date   LABMICR Comment 08/05/2023   WBCUA None seen 04/30/2020   LABEPIT None seen 04/30/2020   BACTERIA NONE SEEN 05/05/2024    Pertinent Imaging: CT 05/05/2024: Image reviewed and discussed with the patient  No results found for this or any previous visit.  Results for orders placed in visit on 03/30/22  US  Venous Img Lower Bilateral  Narrative CLINICAL DATA:  Bilateral lower extremity edema  EXAM: BILATERAL LOWER EXTREMITY VENOUS DOPPLER ULTRASOUND  TECHNIQUE: Gray-scale sonography with graded compression, as well as color Doppler and duplex ultrasound were performed to evaluate the lower extremity deep venous systems from the level of the common femoral vein and  including the common femoral, femoral, profunda femoral, popliteal and calf veins including the posterior tibial, peroneal and gastrocnemius veins when visible. The superficial great saphenous vein was also interrogated. Spectral Doppler was utilized to evaluate flow at rest and with distal augmentation maneuvers in the common femoral, femoral and popliteal veins.  COMPARISON:  None Available.  FINDINGS: RIGHT LOWER EXTREMITY  Common Femoral Vein: No evidence of thrombus. Normal compressibility, respiratory phasicity and response to augmentation.  Saphenofemoral Junction: No evidence of thrombus. Normal compressibility and flow on color Doppler imaging.  Profunda Femoral Vein: No evidence of thrombus. Normal compressibility and flow on color Doppler imaging.  Femoral Vein: No evidence of thrombus. Normal compressibility, respiratory phasicity and response to augmentation.  Popliteal Vein: No evidence of thrombus. Normal compressibility, respiratory phasicity and response to augmentation.  Calf Veins: No evidence of thrombus. Normal compressibility and flow on color Doppler imaging.  Superficial Great Saphenous Vein: No evidence of thrombus. Normal compressibility.  Venous Reflux:  None.  Other Findings:  None.  LEFT LOWER EXTREMITY  Common Femoral Vein: No evidence of thrombus. Normal compressibility, respiratory phasicity and response to augmentation.  Saphenofemoral Junction: No evidence of thrombus. Normal compressibility and flow on color Doppler imaging.  Profunda Femoral Vein: No evidence of thrombus. Normal compressibility and flow on color Doppler imaging.  Femoral Vein: No evidence of thrombus. Normal compressibility, respiratory phasicity and response to augmentation.  Popliteal Vein: No evidence of thrombus. Normal compressibility, respiratory phasicity and response to augmentation.  Calf Veins: No evidence of thrombus. Normal compressibility and  flow on color Doppler imaging.  Superficial Great Saphenous Vein: No evidence of thrombus. Normal compressibility.  Venous Reflux:  None.  Other Findings:  None.  IMPRESSION: No evidence of deep venous thrombosis in either  lower extremity.   Electronically Signed By: Wilkie Lent M.D. On: 03/31/2022 09:45  No results found for this or any previous visit.  No results found for this or any previous visit.  Results for orders placed during the hospital encounter of 02/07/24  US  RENAL  Narrative CLINICAL DATA:  Chronic renal insufficiency  EXAM: RENAL / URINARY TRACT ULTRASOUND COMPLETE  COMPARISON:  08/29/2023  FINDINGS: Right Kidney:  Renal measurements: 11.3 x 6.6 x 5.5 cm = volume: 214.6 mL. Normal right renal cortical echotexture. Simple 1.9 cm peripelvic cyst. No hydronephrosis or nephrolithiasis.  Left Kidney:  Renal measurements: 11.9 x 5.2 x 4.4 cm = volume: 140.4 ML. Normal left renal cortical echotexture. 9 mm simple cortical cyst lower pole left kidney. No hydronephrosis or nephrolithiasis.  Bladder:  Appears normal for degree of bladder distention.  Other:  None.  IMPRESSION: 1. Simple bilateral renal cortical cysts which do not require imaging follow-up. 2. Otherwise unremarkable exam.   Electronically Signed By: Ozell Daring M.D. On: 02/12/2024 20:22  No results found for this or any previous visit.  No results found for this or any previous visit.  No results found for this or any previous visit.   Assessment & Plan:    1. BPH associated with nocturia (Primary) We discussed the management of his BPH including continued medical therapy, Rezum, Urolift, TURP and simple prostatectomy. After discussing the options the patient has elected to proceed with medical therapy. Risks/benefits/alternatives discussed.  - Urinalysis, Routine w reflex microscopic  2. Nocturia Continue flomax  0.4mg  BID  3. Chronic prostatitis without  hematuria Continue flomax  0.4mg  BID   No follow-ups on file.  Belvie Clara, MD  [facility] [place]       [1]  Allergies Allergen Reactions   Bactrim  [Sulfamethoxazole -Trimethoprim ] Hives   Beta Adrenergic Blockers Diarrhea    Currently taking   Ciprofloxacin  Nausea And Vomiting, Rash and Other (See Comments)    Body aches    Doxycycline  Swelling    Lips swelling and skin peeling around mouth   Lasix  [Furosemide ] Rash   Dexamethasone  Swelling   Gabapentin  Swelling    Legs and feet swelling   Methocarbamol Swelling and Rash   Myrbetriq  [Mirabegron ] Hives and Itching   Neomycin Swelling and Rash   Penicillins Rash, Swelling and Dermatitis   Tetracyclines & Related Itching   "

## 2024-06-08 NOTE — Telephone Encounter (Signed)
 Spoke with patient to complete pre-procedure call.     Health status review:  Any new medical conditions, recent signs of acute illness or been started on antibiotics? Recently recovered from a sinus infection with Abx completed on 05/25/24. Reports symptoms have resolved.  Any recent hospitalizations or surgeries? No Any new medications started since pre-op visit? Mycolog, Valacyclovir   Follow all medication instructions prior to procedure or the procedure may be rescheduled:    Continue taking Eliquis  (Apixaban ) twice daily without missing any doses before procedure. Essential chronic medications:  No medication should be continued, unless told otherwise. On the morning of your procedure DO NOT take any medication., including Eliquis  (Apixaban ).  Nothing to eat or drink after midnight prior to your procedure.  Pre-procedure testing scheduled: CT not required and lab work by February 6.  Confirmed patient is scheduled for Atrial Fibrillation Ablation on Friday, February 20 with Dr. Inocencio. Instructed patient to arrive at the Main Entrance A at [facility]: [address] [place], [place] [address] and check in at Admitting at 5:30 AM.  Plan to go home the same day, you will only stay overnight if medically necessary. You MUST have a responsible adult to drive you home and MUST be with you the first 24 hours after you arrive home or your procedure could be cancelled.  Informed a nurse may call a day before the procedure to confirm arrival time and ensure instructions are followed.  Patient verbalized understanding to information provided and is agreeable to proceed with procedure.   Advised to contact RN Navigator at [phone], to inform of any new medications started after call or concerns prior to procedure.

## 2024-06-19 ENCOUNTER — Telehealth: Payer: Self-pay | Admitting: Cardiovascular Disease

## 2024-06-19 NOTE — Telephone Encounter (Signed)
 Provided recommendations from DOD and primary cardiologist. Advised ED precautions. Offered pt 2/5 appointment with WENDI Qua, PA-C in the [place] office at 8:30 am which patient gladly accepted.   Patient had no further questions or concerns at this time.

## 2024-06-19 NOTE — Telephone Encounter (Signed)
 Pt c/o Shortness Of Breath: STAT if SOB developed within the last 24 hours or pt is noticeably SOB on the phone  1. Are you currently SOB (can you hear that pt is SOB on the phone)? Yes  2. How long have you been experiencing SOB? N/A  3. Are you SOB when sitting or when up moving around? Both   4. Are you currently experiencing any other symptoms? Dizzy and his Hr is the 50's range

## 2024-06-19 NOTE — Telephone Encounter (Signed)
 Pt reports he did schedule an appt for Thursday in our office to further discuss. The N/D he is reporting occurred only in the last several days.  Made pt aware I do not feel this is medication related (discussed/confirmed w/ pharmD)  Told pt to keep appt in 2 days for further evaluation.  Advised to contact PCP for N/D concerns, but to let provider know when she sees us  in several day. Will determine plan at upcoming OV. Patient verbalized understanding and agreeable to plan.

## 2024-06-20 NOTE — Progress Notes (Unsigned)
 "  Cardiology Office Note    Date:  06/21/2024  ID:  [name] [name], DOB Nov 28, 1940, MRN [id number] Cardiologist: Maude Emmer, MD Electrophysiologist:  Soyla Gladis Norton, MD { : History of Present Illness:    [name] [name] is a 84 y.o. male with past medical history of CAD (prior PCI to RCA in 1999 with low-risk NST's in 2018 and 05/2022), AAA (prior endograft repair in 07/2023), paroxysmal atrial fibrillation/flutter, epistaxis, HTN, HLD and asthma who presents to the office today for evaluation of bradycardia and fatigue.  He wore a monitor in 03/2024 which showed a 21% paroxysmal atrial fibrillation burden and 8% PVC burden.  He was started on Amiodarone  200 mg daily by the [facility] in 03/2024 while being continued on Eliquis  and Lopressor . He did follow-up with Dr. Norton in 04/2024 and had discontinued Amiodarone  due to worsening dry eyes and glaucoma. Options were reviewed and he wished to proceed with ablation. He was started on Multaq  to bridge him to ablation. It appears he also reported diarrhea with Metoprolol  and was switched to Atenolol  in 04/2024. He did follow-up with Dr. Emmer on 04/25/2024 and was continued on Eliquis  5 mg twice daily, Atenolol  50 mg daily, Multaq  400 mg twice daily and Lovastatin  40 mg daily.  He called the office earlier this week reporting shortness of breath and irregular heartbeat for several days and heart rate had been variable from the 50's to 90's. Had also been hypotensive. Dr. Emmer recommended ED evaluation but he declined, therefore a follow-up visit was arranged.  In talking with the patient today, he reports having significant diarrhea last week but says symptoms have gradually improved. He does report fatigue and has been checking his vitals at home and heart rate has been in the 40's to 50's at times and blood pressure has been soft with systolic readings in the 80's to 90's. He denies any orthopnea or PND. Does have chronic lower  extremity edema which is typically worse along his left leg. No recent chest pain. Reports good compliance with his medications and no reports of active bleeding while on Eliquis .  Studies Reviewed:   EKG: EKG is ordered today and demonstrates:   EKG Interpretation Date/Time:  Thursday June 21 2024 08:44:29 EST Ventricular Rate:  75 PR Interval:  154 QRS Duration:  86 QT Interval:  378 QTC Calculation: 422 R Axis:   38  Text Interpretation: Sinus rhythm with frequent Premature ventricular complexes Slight TWI along lateral leads Confirmed by Johnson Grate ([id number]) on 06/21/2024 8:48:28 AM       Echocardiogram: 01/2024 IMPRESSIONS     1. Left ventricular ejection fraction, by estimation, is 55 to 60%. Left  ventricular ejection fraction by 2D MOD biplane is 55.7 %. The left  ventricle has normal function. The left ventricle has no regional wall  motion abnormalities. Left ventricular  diastolic parameters are consistent with Grade I diastolic dysfunction  (impaired relaxation).   2. Right ventricular systolic function is low normal. The right  ventricular size is normal. Tricuspid regurgitation signal is inadequate  for assessing PA pressure.   3. Right atrial size was moderately dilated.   4. The mitral valve is degenerative. Trivial mitral valve regurgitation.  No evidence of mitral stenosis.   5. Very difficult to visulalize aortic valve leaflets. . The aortic valve  is calcified. Aortic valve regurgitation is trivial. Aortic valve  sclerosis is present, with no evidence of aortic valve stenosis.   6. Aortic dilatation noted.  There is mild dilatation of the aortic root,  measuring 40 mm.   7. The inferior vena cava is normal in size with greater than 50%  respiratory variability, suggesting right atrial pressure of 3 mmHg.   Comparison(s): No prior Echocardiogram.   Event Monitor: 03/2024 NSR average HR 81 bpm Lowest HR in sinus 56 bom PAF burden 21% PAC 6% of  beats PVC 8% beats   Risk Assessment/Calculations:   CHA2DS2-VASc Score = 4  This indicates a 4.8% annual risk of stroke. The patient's score is based upon: CHF History: 0 HTN History: 1 Diabetes History: 0 Stroke History: 0 Vascular Disease History: 1 Age Score: 2 Gender Score: 0    Physical Exam:   VS:  BP (!) 100/56 (BP Location: Left Arm, Cuff Size: Normal)   Pulse 76   Ht 6' 1 (1.854 m)   Wt 212 lb 9.6 oz (96.4 kg)   BMI 28.05 kg/m    Wt Readings from Last 3 Encounters:  06/21/24 212 lb 9.6 oz (96.4 kg)  06/07/24 207 lb (93.9 kg)  05/15/24 211 lb 6 oz (95.9 kg)     GEN: Well nourished, well developed male appearing in no acute distress NECK: No JVD; No carotid bruits CARDIAC: RRR with occasional ectopic beats, no murmurs, rubs, gallops RESPIRATORY:  Clear to auscultation without rales, wheezing or rhonchi  ABDOMEN: Appears non-distended. No obvious abdominal masses. EXTREMITIES: No clubbing or cyanosis. No pitting edema.  Distal pedal pulses are 2+ bilaterally.   Assessment and Plan:   1. Paroxysmal A-fib (HCC) - He had a 21% atrial fibrillation burden by monitor in 03/2024 and has been followed by EP with plans for ablation later this month. He was intolerant to Amiodarone  and is currently on Multaq  400 mg twice daily.  I doubt his recent diarrhea is secondary to this as he reports symptoms are already improving despite continuing on the medication. Given his fatigue and hypotension, will reduce Atenolol  from 50 mg daily to 25 mg daily. Will check labs for his ablation including CBC and BMET.  - Continue Eliquis  5 mg twice daily for anticoagulation which is the appropriate dose given his current age (84 years old), weight (212 lbs) and renal function (creatinine at 0.91 in 04/2024).   2. PVC (premature ventricular contraction) - His prior monitor in 02/2024 showed an 8% PVC burden and he does have these on his EKG today. I suspect this is what is leading to the  pseudobradycardia on his heart monitor as his heart rate is technically in the 70's today. Given his fatigue and hypotension, will reduce Atenolol  from 50 mg daily to 25 mg daily. He previously had intolerance to beta-blockers in the past by review of his chart but this was while on Metoprolol . I encouraged him to make us  aware if symptoms persist as we may need to reduce Atenolol  to 12.5 mg daily.  Will recheck labs including CBC, BMET and Mg given his PVC's as he could have electrolyte abnormalities in the setting of recent diarrhea.  3. Coronary artery disease involving native coronary artery of native heart without angina pectoris - He previously underwent prior PCI to the RCA in 1999 with low risk NST's in 2018 and 05/2022. He reports having shortness of breath and fatigue but this is in the setting of being started on Atenolol  and Multaq . No recent chest pain. Will reduce Atenolol  as discussed above. He is not on ASA given the need for anticoagulation. Continue Lovastatin  40 mg daily.  4. Mixed hyperlipidemia - FLP in 08/2023 showed total cholesterol 134, triglycerides 115, HDL 45 and LDL 68. Continue current medical therapy with Lovastatin  40 mg daily.  5. Abdominal aortic aneurysm (AAA) without rupture, unspecified part - He previously underwent endograft repair in 07/2023. Followed by Vascular Surgery.  6. Essential hypertension - His blood pressure is soft at 100/56 during today's visit. As discussed above, will reduce Atenolol  from 50 mg daily to 25 mg daily.   Signed, Laymon CHRISTELLA Qua, PA-C   "

## 2024-06-21 ENCOUNTER — Encounter: Payer: Self-pay | Admitting: Student

## 2024-06-21 ENCOUNTER — Ambulatory Visit: Admitting: Student

## 2024-06-21 VITALS — BP 100/56 | HR 76 | Ht 73.0 in | Wt 212.6 lb

## 2024-06-21 DIAGNOSIS — I2510 Atherosclerotic heart disease of native coronary artery without angina pectoris: Secondary | ICD-10-CM | POA: Diagnosis not present

## 2024-06-21 DIAGNOSIS — I10 Essential (primary) hypertension: Secondary | ICD-10-CM | POA: Diagnosis not present

## 2024-06-21 DIAGNOSIS — E782 Mixed hyperlipidemia: Secondary | ICD-10-CM

## 2024-06-21 DIAGNOSIS — I480 Paroxysmal atrial fibrillation: Secondary | ICD-10-CM

## 2024-06-21 DIAGNOSIS — I493 Ventricular premature depolarization: Secondary | ICD-10-CM

## 2024-06-21 DIAGNOSIS — I714 Abdominal aortic aneurysm, without rupture, unspecified: Secondary | ICD-10-CM | POA: Diagnosis not present

## 2024-06-21 MED ORDER — ATENOLOL 25 MG PO TABS: 25.0000 mg | ORAL_TABLET | Freq: Every day | ORAL | 1 refills | Status: AC

## 2024-06-21 MED ORDER — ATENOLOL 25 MG PO TABS: 25.0000 mg | ORAL_TABLET | Freq: Every day | ORAL | 1 refills | Status: DC

## 2024-06-21 NOTE — Patient Instructions (Signed)
 Medication Instructions:  Your physician has recommended you make the following change in your medication:   -Decrease Atenolol  to 25 mg once daily   *If you need a refill on your cardiac medications before your next appointment, please call your pharmacy*  Lab Work: CBC BMET MAG  If you have labs (blood work) drawn today and your tests are completely normal, you will receive your results only by: MyChart Message (if you have MyChart) OR A paper copy in the mail If you have any lab test that is abnormal or we need to change your treatment, we will call you to review the results.  Testing/Procedures: None  Follow-Up: At [facility], you and your health needs are our priority.  As part of our continuing mission to provide you with exceptional heart care, our providers are all part of one team.  This team includes your primary Cardiologist (physician) and Advanced Practice Providers or APPs (Physician Assistants and Nurse Practitioners) who all work together to provide you with the care you need, when you need it.  Your next appointment:   6 month(s)  Provider:   You may see Maude Emmer, MD or one of the following Advanced Practice Providers on your designated Care Team:   Laymon Qua, PA-C  [place], [place] Olivia [place], [place]     We recommend signing up for the patient portal called MyChart.  Sign up information is provided on this After Visit Summary.  MyChart is used to connect with patients for Virtual Visits (Telemedicine).  Patients are able to view lab/test results, encounter notes, upcoming appointments, etc.  Non-urgent messages can be sent to your provider as well.   To learn more about what you can do with MyChart, go to [email or web address].   Other Instructions Thank you for choosing [facility]!

## 2024-06-22 ENCOUNTER — Emergency Department (HOSPITAL_COMMUNITY)

## 2024-06-22 ENCOUNTER — Encounter (HOSPITAL_COMMUNITY): Payer: Self-pay | Admitting: Emergency Medicine

## 2024-06-22 ENCOUNTER — Inpatient Hospital Stay (HOSPITAL_COMMUNITY): Admission: EM | Admit: 2024-06-22 | Source: Home / Self Care | Admitting: Internal Medicine

## 2024-06-22 ENCOUNTER — Ambulatory Visit: Payer: Self-pay | Admitting: Student

## 2024-06-22 ENCOUNTER — Other Ambulatory Visit: Payer: Self-pay

## 2024-06-22 DIAGNOSIS — I480 Paroxysmal atrial fibrillation: Secondary | ICD-10-CM | POA: Diagnosis present

## 2024-06-22 DIAGNOSIS — J9601 Acute respiratory failure with hypoxia: Secondary | ICD-10-CM | POA: Diagnosis present

## 2024-06-22 DIAGNOSIS — I2510 Atherosclerotic heart disease of native coronary artery without angina pectoris: Secondary | ICD-10-CM | POA: Diagnosis present

## 2024-06-22 DIAGNOSIS — J441 Chronic obstructive pulmonary disease with (acute) exacerbation: Principal | ICD-10-CM | POA: Diagnosis present

## 2024-06-22 DIAGNOSIS — J189 Pneumonia, unspecified organism: Secondary | ICD-10-CM

## 2024-06-22 LAB — BASIC METABOLIC PANEL WITH GFR
BUN/Creatinine Ratio: 11 (ref 10–24)
BUN: 13 mg/dL (ref 8–27)
CO2: 22 mmol/L (ref 20–29)
Calcium: 8.3 mg/dL — ABNORMAL LOW (ref 8.6–10.2)
Chloride: 102 mmol/L (ref 96–106)
Creatinine, Ser: 1.18 mg/dL (ref 0.76–1.27)
Glucose: 94 mg/dL (ref 70–99)
Potassium: 4 mmol/L (ref 3.5–5.2)
Sodium: 137 mmol/L (ref 134–144)
eGFR: 62 mL/min/{1.73_m2}

## 2024-06-22 LAB — CBC
Hematocrit: 37.3 % — ABNORMAL LOW (ref 37.5–51.0)
Hemoglobin: 12.2 g/dL — ABNORMAL LOW (ref 13.0–17.7)
MCH: 28.2 pg (ref 26.6–33.0)
MCHC: 32.7 g/dL (ref 31.5–35.7)
MCV: 86 fL (ref 79–97)
Platelets: 112 10*3/uL — ABNORMAL LOW (ref 150–450)
RBC: 4.32 x10E6/uL (ref 4.14–5.80)
RDW: 14.5 % (ref 11.6–15.4)
WBC: 4.2 10*3/uL (ref 3.4–10.8)

## 2024-06-22 LAB — CBC WITH DIFFERENTIAL/PLATELET
Abs Immature Granulocytes: 0.02 10*3/uL (ref 0.00–0.07)
Basophils Absolute: 0 10*3/uL (ref 0.0–0.1)
Basophils Relative: 0 %
Eosinophils Absolute: 0 10*3/uL (ref 0.0–0.5)
Eosinophils Relative: 1 %
HCT: 38.4 % — ABNORMAL LOW (ref 39.0–52.0)
Hemoglobin: 12.2 g/dL — ABNORMAL LOW (ref 13.0–17.0)
Immature Granulocytes: 0 %
Lymphocytes Relative: 14 %
Lymphs Abs: 0.7 10*3/uL (ref 0.7–4.0)
MCH: 28 pg (ref 26.0–34.0)
MCHC: 31.8 g/dL (ref 30.0–36.0)
MCV: 88.1 fL (ref 80.0–100.0)
Monocytes Absolute: 0.4 10*3/uL (ref 0.1–1.0)
Monocytes Relative: 7 %
Neutro Abs: 3.9 10*3/uL (ref 1.7–7.7)
Neutrophils Relative %: 78 %
Platelets: 115 10*3/uL — ABNORMAL LOW (ref 150–400)
RBC: 4.36 MIL/uL (ref 4.22–5.81)
RDW: 15.9 % — ABNORMAL HIGH (ref 11.5–15.5)
WBC: 5.1 10*3/uL (ref 4.0–10.5)
nRBC: 0 % (ref 0.0–0.2)

## 2024-06-22 LAB — COMPREHENSIVE METABOLIC PANEL WITH GFR
ALT: 69 U/L — ABNORMAL HIGH (ref 0–44)
AST: 83 U/L — ABNORMAL HIGH (ref 15–41)
Albumin: 3.6 g/dL (ref 3.5–5.0)
Alkaline Phosphatase: 131 U/L — ABNORMAL HIGH (ref 38–126)
Anion gap: 12 (ref 5–15)
BUN: 9 mg/dL (ref 8–23)
CO2: 26 mmol/L (ref 22–32)
Calcium: 8.2 mg/dL — ABNORMAL LOW (ref 8.9–10.3)
Chloride: 102 mmol/L (ref 98–111)
Creatinine, Ser: 0.99 mg/dL (ref 0.61–1.24)
GFR, Estimated: >60 mL/min
Glucose, Bld: 95 mg/dL (ref 70–99)
Potassium: 4.2 mmol/L (ref 3.5–5.1)
Sodium: 139 mmol/L (ref 135–145)
Total Bilirubin: 0.6 mg/dL (ref 0.0–1.2)
Total Protein: 6.5 g/dL (ref 6.5–8.1)

## 2024-06-22 LAB — CULTURE, BLOOD (ROUTINE X 2)
Special Requests: ADEQUATE
Special Requests: ADEQUATE

## 2024-06-22 LAB — TROPONIN T, HIGH SENSITIVITY
Troponin T High Sensitivity: 32 ng/L — ABNORMAL HIGH (ref 0–19)
Troponin T High Sensitivity: 29 ng/L — ABNORMAL HIGH (ref 0–19)

## 2024-06-22 LAB — PRO BRAIN NATRIURETIC PEPTIDE: Pro Brain Natriuretic Peptide: 1000 pg/mL — ABNORMAL HIGH

## 2024-06-22 LAB — LACTIC ACID, PLASMA: Lactic Acid, Venous: 2.3 mmol/L (ref 0.5–1.9)

## 2024-06-22 LAB — RESP PANEL BY RT-PCR (RSV, FLU A&B, COVID)  RVPGX2
Influenza A by PCR: NEGATIVE
Influenza B by PCR: NEGATIVE
Resp Syncytial Virus by PCR: NEGATIVE
SARS Coronavirus 2 by RT PCR: NEGATIVE

## 2024-06-22 LAB — MAGNESIUM: Magnesium: 1.8 mg/dL (ref 1.6–2.3)

## 2024-06-22 MED ORDER — AZITHROMYCIN 250 MG PO TABS
500.0000 mg | ORAL_TABLET | Freq: Once | ORAL | Status: AC
  Administered 2024-06-22: 500 mg via ORAL
  Filled 2024-06-22: qty 2

## 2024-06-22 MED ORDER — PHENOL 1.4 % MT LIQD
1.0000 | OROMUCOSAL | Status: AC | PRN
  Administered 2024-06-22: 1 via OROMUCOSAL
  Filled 2024-06-22: qty 177

## 2024-06-22 MED ORDER — PANTOPRAZOLE SODIUM 40 MG PO TBEC: 40.0000 mg | DELAYED_RELEASE_TABLET | Freq: Every day | ORAL | Status: AC

## 2024-06-22 MED ORDER — ATENOLOL 25 MG PO TABS: 25.0000 mg | ORAL_TABLET | Freq: Every day | ORAL | Status: AC

## 2024-06-22 MED ORDER — LATANOPROST 0.005 % OP SOLN
1.0000 [drp] | Freq: Every day | OPHTHALMIC | Status: AC
  Administered 2024-06-22: 1 [drp] via OPHTHALMIC
  Filled 2024-06-22: qty 7.5

## 2024-06-22 MED ORDER — ARFORMOTEROL TARTRATE 15 MCG/2ML IN NEBU
15.0000 ug | INHALATION_SOLUTION | Freq: Two times a day (BID) | RESPIRATORY_TRACT | Status: AC
  Administered 2024-06-22: 15 ug via RESPIRATORY_TRACT
  Filled 2024-06-22: qty 2

## 2024-06-22 MED ORDER — METHYLPREDNISOLONE SODIUM SUCC 125 MG IJ SOLR
80.0000 mg | Freq: Once | INTRAMUSCULAR | Status: AC
  Administered 2024-06-22: 80 mg via INTRAVENOUS
  Filled 2024-06-22: qty 2

## 2024-06-22 MED ORDER — LORAZEPAM 1 MG PO TABS
1.0000 mg | ORAL_TABLET | Freq: Every evening | ORAL | Status: AC | PRN
  Administered 2024-06-22: 1 mg via ORAL
  Filled 2024-06-22: qty 1

## 2024-06-22 MED ORDER — SODIUM CHLORIDE 0.9 % IV SOLN: 1.0000 g | INTRAVENOUS | Status: AC

## 2024-06-22 MED ORDER — ALBUTEROL SULFATE (2.5 MG/3ML) 0.083% IN NEBU
2.5000 mg | INHALATION_SOLUTION | RESPIRATORY_TRACT | Status: AC | PRN
  Administered 2024-06-22: 2.5 mg via RESPIRATORY_TRACT
  Filled 2024-06-22: qty 3

## 2024-06-22 MED ORDER — METHYLPREDNISOLONE SODIUM SUCC 125 MG IJ SOLR
60.0000 mg | Freq: Two times a day (BID) | INTRAMUSCULAR | Status: AC
  Administered 2024-06-22: 60 mg via INTRAVENOUS
  Filled 2024-06-22: qty 2

## 2024-06-22 MED ORDER — ONDANSETRON HCL 4 MG PO TABS: 4.0000 mg | ORAL_TABLET | Freq: Four times a day (QID) | ORAL | Status: AC | PRN

## 2024-06-22 MED ORDER — IOHEXOL 350 MG/ML SOLN
75.0000 mL | Freq: Once | INTRAVENOUS | Status: AC | PRN
  Administered 2024-06-22: 75 mL via INTRAVENOUS

## 2024-06-22 MED ORDER — APIXABAN 5 MG PO TABS
5.0000 mg | ORAL_TABLET | Freq: Two times a day (BID) | ORAL | Status: AC
  Administered 2024-06-22: 5 mg via ORAL
  Filled 2024-06-22: qty 1

## 2024-06-22 MED ORDER — ONDANSETRON HCL 4 MG/2ML IJ SOLN: 4.0000 mg | Freq: Four times a day (QID) | INTRAMUSCULAR | Status: AC | PRN

## 2024-06-22 MED ORDER — LORATADINE 10 MG PO TABS
10.0000 mg | ORAL_TABLET | Freq: Every day | ORAL | Status: AC
  Administered 2024-06-22: 10 mg via ORAL
  Filled 2024-06-22: qty 1

## 2024-06-22 MED ORDER — PRAVASTATIN SODIUM 40 MG PO TABS: 40.0000 mg | ORAL_TABLET | Freq: Every day | ORAL | Status: AC

## 2024-06-22 MED ORDER — AZITHROMYCIN 250 MG PO TABS: 500.0000 mg | ORAL_TABLET | Freq: Every day | ORAL | Status: AC

## 2024-06-22 MED ORDER — TAMSULOSIN HCL 0.4 MG PO CAPS: 0.8000 mg | ORAL_CAPSULE | Freq: Every day | ORAL | Status: AC

## 2024-06-22 MED ORDER — ACETAMINOPHEN 325 MG PO TABS: 650.0000 mg | ORAL_TABLET | Freq: Four times a day (QID) | ORAL | Status: AC | PRN

## 2024-06-22 MED ORDER — SODIUM CHLORIDE 0.9 % IV SOLN
1.0000 g | Freq: Once | INTRAVENOUS | Status: AC
  Administered 2024-06-22: 1 g via INTRAVENOUS
  Filled 2024-06-22: qty 10

## 2024-06-22 MED ORDER — DRONEDARONE HCL 400 MG PO TABS: 400.0000 mg | ORAL_TABLET | Freq: Two times a day (BID) | ORAL | Status: AC

## 2024-06-22 MED ORDER — IPRATROPIUM-ALBUTEROL 0.5-2.5 (3) MG/3ML IN SOLN
3.0000 mL | Freq: Four times a day (QID) | RESPIRATORY_TRACT | Status: AC
  Administered 2024-06-22: 3 mL via RESPIRATORY_TRACT
  Filled 2024-06-22: qty 3

## 2024-06-22 MED ORDER — IPRATROPIUM-ALBUTEROL 0.5-2.5 (3) MG/3ML IN SOLN
3.0000 mL | Freq: Once | RESPIRATORY_TRACT | Status: AC
  Administered 2024-06-22: 3 mL via RESPIRATORY_TRACT
  Filled 2024-06-22: qty 3

## 2024-06-22 MED ORDER — ACETAMINOPHEN 650 MG RE SUPP: 650.0000 mg | Freq: Four times a day (QID) | RECTAL | Status: AC | PRN

## 2024-06-22 MED ORDER — BUDESONIDE 0.5 MG/2ML IN SUSP
0.5000 mg | Freq: Two times a day (BID) | RESPIRATORY_TRACT | Status: AC
  Administered 2024-06-22: 0.5 mg via RESPIRATORY_TRACT
  Filled 2024-06-22: qty 2

## 2024-06-22 NOTE — ED Triage Notes (Signed)
 Pt to ER via EMS from home with c/o new onset SHOB starting on waking this AM.  Pt has hx of afib starting in September and is scheduled for ablation on 2/20.  Also has hx of COPS and has been feeling poorly the last few weeks.

## 2024-06-22 NOTE — H&P (Signed)
 " History and Physical    Patient: [name] [name] FMW:[id number] DOB: 1940/10/23 DOA: 06/22/2024 DOS: the patient was seen and examined on 06/22/2024 PCP: Cook, Jayce G, DO  Patient coming from: Home  Chief Complaint:  Chief Complaint  Patient presents with   Shortness of Breath   HPI: [name] [name] is a 84 year old male with a history of  CAD (prior PCI to RCA in 1999 with low-risk NST's in 2018 and 05/2022), paroxysmal atrial fibrillation on apixaban , AAA status post stent graft repair with aortobiiliac endograft 07/29/2023, BPH, hyperlipidemia, hypertension, COPD, BPH presenting with at least 1 week history of shortness of breath, coughing, chest congestion.  Patient states that his breathing was much worse today.  He tried using his nebulizer machine at home without much relief.  As result he presented for further evaluation and treatment.  He has had some low-grade temperatures up to 100.0 F.  He has a nonproductive cough.  He denies any hemoptysis.  He denies any chest pain, abdominal pain, nausea, vomiting, diarrhea, hematochezia, melena.  The patient does note some increasing lower extremity edema over the past 2 weeks.  He has noted some increasing abdominal girth.  He states that his weight has been fluctuating between 208 and 211 pounds.  He has noted some PND type symptoms over the past 2 nights.  In the ED, the patient was afebrile and hemodynamically stable.  Oxygen saturation was 89% on room air.  The patient was placed on 2 L with saturation up to 95%.  WBC 5.1, hemoglobin 12.2, plates 884.  Sodium 139, potassium 4.2, bicarbonate 26, serum creatinine 0.99.  AST 83, ALT 69, alk phosphatase 131, total bilirubin 0.6.  COVID-19 PCR is negative.  proBNP 1000.  Troponin 32>> 29.  EKG showed sinus rhythm with PVCs.  CTA chest was negative for PE.  It showed mild diffuse bronchial wall thickening.  There are scattered bilateral tree-in-bud nodules.  The patient was started on bronchodilators IV  Solu-Medrol  in the emergency department.  He was given ceftriaxone  and azithromycin .  Review of Systems: As mentioned in the history of present illness. All other systems reviewed and are negative. Past Medical History:  Diagnosis Date   AAA (abdominal aortic aneurysm)    needs yearly ultrasound   Allergy    Anemia    Arthritis    Asthma    BCC (basal cell carcinoma) 08/18/1989   left shoulder blad, upper right arm, left arm beyond elbow, c&d   BCC (basal cell carcinoma) 01/31/1992   Posterior neck, curetx3, 11fu   BCC (basal cell carcinoma) 11/22/2001   mid forehead, cx3, excision, right forearm, cx3, 39fu   BCC (basal cell carcinoma) 10/09/2003   mid forehead, MOHs   BCC (basal cell carcinoma) 08/15/2008   upper left back, biopsy   BPH (benign prostatic hyperplasia)    CAD (coronary artery disease)    Cancer (HCC)    skin cancer   COPD (chronic obstructive pulmonary disease) (HCC)    Dysrhythmia    pt. states it can be fast at times   GERD (gastroesophageal reflux disease)    Glaucoma    History of acute pancreatitis 12/21/2022   HOH (hard of hearing)    Hypercholesterolemia    Hypertension    Impaired fasting glucose    Low back pain    Melanoma in situ (HCC) 10/09/2003   left chin, MOHs   MI (myocardial infarction) (HCC) 1999   Peripheral vascular disease    AAA  SCC (squamous cell carcinoma) 07/03/2014   in situ, behind left ear, cx3, cautery, 69fu   SCC (squamous cell carcinoma) 07/03/2014   well diff, left forearm, biopsy, cx1, cautery   SCC (squamous cell carcinoma) 07/20/2017   in situ, left upper arm, cx3, 46fu   SCC (squamous cell carcinoma) 01/10/2019   in situ, left post shoulder, cx3, 79fu   SCC (squamous cell carcinoma) 11/22/2001   left forearm distal, left forearm, cx3, 49fu   SCC (squamous cell carcinoma) 10/09/2003   [place], left ear post, clear per st, right cheek clear   SCC (squamous cell carcinoma) 03/30/2004   in situ, left upper arm, cx3, 59fu    SCC (squamous cell carcinoma) 03/08/2005   in situ, right cheek, mid upper forehead, cx3, 24fu   SCC (squamous cell carcinoma) 06/08/2006   in situ, left shoulder, cx3, 31fu   SCC (squamous cell carcinoma) 05/05/2010   right inner wrist, biopsy   SCC (squamous cell carcinoma) 09/13/2013   in situ, right crown scalp, front scalp, biopsy   Thrush    Past Surgical History:  Procedure Laterality Date   ABDOMINAL AORTIC ENDOVASCULAR STENT GRAFT N/A 07/29/2023   Procedure: INSERTION, ENDOVASCULAR STENT GRAFT, AORTA, ABDOMINAL;  Surgeon: Gretta Lonni PARAS, MD;  Location: [facility] OR;  Service: Vascular;  Laterality: N/A;   BACK SURGERY     x 3   CARDIAC CATHETERIZATION     angioplasty   CATARACT EXTRACTION W/PHACO  03/20/2012   Procedure: CATARACT EXTRACTION PHACO AND INTRAOCULAR LENS PLACEMENT (IOC);  Surgeon: Dow JULIANNA Burke, MD;  Location: [facility];  Service: Ophthalmology;  Laterality: Right;  CDE:  8.45   CATARACT EXTRACTION W/PHACO Left 04/02/2013   Procedure: CATARACT EXTRACTION PHACO AND INTRAOCULAR LENS PLACEMENT (IOC);  Surgeon: Dow JULIANNA Burke, MD;  Location: [facility];  Service: Ophthalmology;  Laterality: Left;  CDE:  6.50   CHOLECYSTECTOMY  2000   COLONOSCOPY  2009   repeat 5 years   ESOPHAGOGASTRODUODENOSCOPY     HERNIA REPAIR Left    inguinal   INGUINAL HERNIA REPAIR Right 03/28/2020   Procedure: Right Inguinal Herniorrhaphy with Mesh;  Surgeon: Mavis Anes, MD;  Location: [facility];  Service: General;  Laterality: Right;   LAPAROSCOPIC PARTIAL COLECTOMY N/A 06/11/2013   Procedure: LAPAROSCOPIC HAND ASSISTED PARTIAL COLECTOMY;  Surgeon: Anes DELENA Mavis, MD;  Location: [facility];  Service: General;  Laterality: N/A;   NASAL ENDOSCOPY WITH EPISTAXIS CONTROL Bilateral 02/11/2020   Procedure: NASAL ENDOSCOPY WITH EPISTAXIS CONTROL;  Surgeon: Karis Clunes, MD;  Location: [facility];  Service: ENT;  Laterality: Bilateral;   NASAL ENDOSCOPY WITH EPISTAXIS CONTROL Right 02/15/2024    Procedure: CONTROL OF EPISTAXIS RIGHT SIDE;  Surgeon: Carlie Clark, MD;  Location: [facility] OR;  Service: ENT;  Laterality: Right;   NASAL SINUS SURGERY Bilateral 02/15/2024   Procedure: BILATERAL NASAL ENDOSCOPY W/ CAUTERY AND LEFT SPHENOPALATINE CLIPPING;  Surgeon: Carlie Clark, MD;  Location[facility] OR;  Service: ENT;  Laterality: Bilateral;  BILATERAL NASAL ENDOSCOPY W/ CAUTERY AND LEFT SPHENOPALATINE CLIPPING   REPAIR ILIAC ARTERY Right 07/29/2023   Procedure: REPAIR, ARTERY, FEMORAL;  Surgeon: Gretta Lonni PARAS, MD;  Location: [facility] OR;  Service: Vascular;  Laterality: Right;   right eye detached retina Bilateral    SEPTOPLASTY Left 02/15/2024   Procedure: SEPTOPLASTY, NOSE;  Surgeon: Carlie Clark, MD;  Location: [facility] OR;  Service: ENT;  Laterality: Left;   SPINAL FUSION  2016   ULTRASOUND GUIDANCE FOR VASCULAR ACCESS Bilateral 07/29/2023  Procedure: ULTRASOUND GUIDANCE, FOR VASCULAR ACCESS;  Surgeon: Gretta Lonni PARAS, MD;  Location: [facility] OR;  Service: Vascular;  Laterality: Bilateral;   YAG LASER APPLICATION Left 05/06/2014   Procedure: YAG LASER APPLICATION;  Surgeon: Dow JULIANNA Burke, MD;  Location: [facility];  Service: Ophthalmology;  Laterality: Left;   Social History:  reports that he quit smoking about 21 years ago. His smoking use included cigarettes. He started smoking about 56 years ago. He has a 35 pack-year smoking history. He has never used smokeless tobacco. He reports that he does not drink alcohol and does not use drugs.  Allergies[1]  Family History  Problem Relation Age of Onset   Hypertension Mother    COPD Father    Cancer Brother        brain    Prior to Admission medications  Medication Sig Start Date End Date Taking? Authorizing Provider  albuterol  (PROVENTIL ) (2.5 MG/3ML) 0.083% nebulizer solution Take 3 mLs (2.5 mg total) by nebulization every 6 (six) hours as needed for wheezing or shortness of breath. 07/26/23   Darlean Ozell NOVAK, MD  albuterol  (VENTOLIN  HFA) 108 (90  Base) MCG/ACT inhaler Inhale 2 puffs into the lungs every 4 (four) hours as needed for wheezing or shortness of breath. 05/07/24   Olalere, Jennet LABOR, MD  apixaban  (ELIQUIS ) 5 MG TABS tablet Take 1 tablet (5 mg total) by mouth 2 (two) times daily. 03/29/24   Nishan, Peter C, MD  atenolol  (TENORMIN ) 25 MG tablet Take 1 tablet (25 mg total) by mouth daily. 06/21/24   Strader, Laymon HERO, PA-C  cefdinir  (OMNICEF ) 300 MG capsule Take 1 capsule (300 mg total) by mouth 2 (two) times daily. Patient not taking: Reported on 06/21/2024 05/15/24   Cook, Jayce G, DO  dronedarone  (MULTAQ ) 400 MG tablet Take 1 tablet (400 mg total) by mouth 2 (two) times daily with a meal. 05/09/24   Delford Maude BROCKS, MD  esomeprazole  (NEXIUM ) 40 MG capsule Take 30-60 min before first meal of the day 09/22/23   Darlean Ozell NOVAK, MD  fluconazole  (DIFLUCAN ) 100 MG tablet Take 1 tablet (100 mg total) by mouth daily. X 7 days 06/08/24   Sherrilee Belvie CROME, MD  fluticasone -salmeterol (ADVAIR  HFA) 230-21 MCG/ACT inhaler Take 2 puffs first thing in am and then another 2 puffs about 12 hours later. 10/20/23   Darlean Ozell NOVAK, MD  latanoprost  (XALATAN ) 0.005 % ophthalmic solution Place 1 drop into both eyes at bedtime.    [provider]  loratadine  (CLARITIN ) 10 MG tablet Take 10 mg by mouth daily.    [provider]  LORazepam  (ATIVAN ) 1 MG tablet Take 1 tablet (1 mg total) by mouth at bedtime as needed for sleep. 05/15/24   Cook, Jayce G, DO  lovastatin  (MEVACOR ) 40 MG tablet Take 1 tablet (40 mg total) by mouth daily. 04/02/24   Cook, Jayce G, DO  metroNIDAZOLE  (METROGEL ) 0.75 % gel Apply 1 Application topically daily as needed (rosacea). Patient taking differently: Apply 1 Application topically as needed (rosacea). 06/20/23   [provider]  nystatin -triamcinolone  ointment (MYCOLOG) Apply 1 Application topically 2 (two) times daily. 06/08/24   McKenzie, Belvie CROME, MD  oxymetazoline  (AFRIN) 0.05 % nasal spray Place 1  spray into both nostrils 2 (two) times daily as needed (Nose bleed). Patient taking differently: Place 1 spray into both nostrils as needed (Nose bleed).    [provider]  Spacer/Aero-Holding Raguel FRENCH Use as directed 02/17/22   Neda Jennet LABOR, MD  tamsulosin  (FLOMAX ) 0.4 MG CAPS capsule Take 2 capsules (0.8 mg total) by mouth daily. 01/20/24   Cook, Jayce G, DO  triamcinolone  cream (KENALOG ) 0.1 % Apply 1 Application topically 2 (two) times daily as needed (itching). 04/21/23   [provider]    Physical Exam: Vitals:   06/22/24 1016 06/22/24 1029 06/22/24 1100  BP: 114/68  (!) 102/90  Pulse: 86  80  Resp: 19  (!) 21  Temp: 98.4 F (36.9 C)  98.2 F (36.8 C)  TempSrc:   Oral  SpO2: 93% 93% 94%  Weight: 96.2 kg    Height: 6' 1 (1.854 m)     GENERAL:  A&O x 3, NAD, well developed, cooperative, follows commands HEENT: [place]/AT, No thrush, No icterus, No oral ulcers Neck:  No neck mass, No meningismus, soft, supple CV: RRR, no S3, no S4, no rub, no JVD Lungs: Bilateral crackles.  Bilateral expiratory wheeze. Abd: soft/NT +BS, nondistended Ext: 1 + LE edema, no lymphangitis, no cyanosis, no rashes Neuro:  CN II-XII intact, strength 4/5 in RUE, RLE, strength 4/5 LUE, LLE; sensation intact bilateral; no dysmetria; babinski equivocal  Data Reviewed: Data reviewed above in the history  Assessment and Plan: Acute respiratory failure with hypoxia -Secondary to COPD exacerbation - He has some signs of fluid overload - Stable 2 L nasal cannula - Wean oxygen for saturation greater 90%  COPD exacerbation - Start Brovana  - Start Pulmicort  - Continue IV Solu-Medrol  - Continue DuoNebs  Chronic HFpEF - 02/05/2024 echo EF 55 to 60%, no WMA, grade 1 DD, low normal RVF - Give furosemide  40 mg IV x 1 - Daily weights - Echo  Paroxysmal atrial fibrillation - Continue Multaq  as a bridge for his upcoming ablation - Continue apixaban  - Continue  atenolol   Thrombocytopenia - B12 - Folic acid - TSH  AAA - Status post repair 07/29/2023 Dr. Gretta   Coronary artery disease - Status post PCI to RCA 1999 - No chest pain presently - Continue aspirin    BPH with LUTS - Continue tamsulosin  - Follow-up Dr. Sherrilee   Mixed hyperlipidemia - Continue statin      Advance Care Planning: FULL  Consults: none  Family Communication: daughter 2/6  Severity of Illness: The appropriate patient status for this patient is INPATIENT. Inpatient status is judged to be reasonable and necessary in order to provide the required intensity of service to ensure the patient's safety. The patient's presenting symptoms, physical exam findings, and initial radiographic and laboratory data in the context of their chronic comorbidities is felt to place them at high risk for further clinical deterioration. Furthermore, it is not anticipated that the patient will be medically stable for discharge from the hospital within 2 midnights of admission.   * I certify that at the point of admission it is my clinical judgment that the patient will require inpatient hospital care spanning beyond 2 midnights from the point of admission due to high intensity of service, high risk for further deterioration and high frequency of surveillance required.*  Author: Alm Schneider, MD 06/22/2024 3:42 PM  For on call review [email or web address].     [1]  Allergies Allergen Reactions   Bactrim  [Sulfamethoxazole -Trimethoprim ] Hives   Beta Adrenergic Blockers Diarrhea    Currently taking   Ciprofloxacin  Nausea And Vomiting, Rash and Other (See Comments)    Body aches    Doxycycline  Swelling    Lips swelling and skin peeling around mouth   Lasix  [Furosemide ] Rash   Dexamethasone  Swelling  Gabapentin  Swelling    Legs and feet swelling   Methocarbamol Swelling and Rash   Myrbetriq  [Mirabegron ] Hives and Itching   Neomycin Swelling and Rash   Penicillins Rash, Swelling and  Dermatitis   Tetracyclines & Related Itching   "

## 2024-06-22 NOTE — Hospital Course (Signed)
 84 year old male with a history of  CAD (prior PCI to RCA in 1999 with low-risk NST's in 2018 and 05/2022), paroxysmal atrial fibrillation on apixaban , AAA status post stent graft repair with aortobiiliac endograft 07/29/2023, BPH, hyperlipidemia, hypertension, COPD, BPH presenting with at least 1 week history of shortness of breath, coughing, chest congestion.  Patient states that his breathing was much worse today.  He tried using his nebulizer machine at home without much relief.  As result he presented for further evaluation and treatment.  He has had some low-grade temperatures up to 100.0 F.  He has a nonproductive cough.  He denies any hemoptysis.  He denies any chest pain, abdominal pain, nausea, vomiting, diarrhea, hematochezia, melena.  The patient does note some increasing lower extremity edema over the past 2 weeks.  He has noted some increasing abdominal girth.  He states that his weight has been fluctuating between 208 and 211 pounds.  He has noted some PND type symptoms over the past 2 nights.  In the ED, the patient was afebrile and hemodynamically stable.  Oxygen saturation was 89% on room air.  The patient was placed on 2 L with saturation up to 95%.  WBC 5.1, hemoglobin 12.2, plates 884.  Sodium 139, potassium 4.2, bicarbonate 26, serum creatinine 0.99.  AST 83, ALT 69, alk phosphatase 131, total bilirubin 0.6.  COVID-19 PCR is negative.  proBNP 1000.  Troponin 32>> 29.  EKG showed sinus rhythm with PVCs.  CTA chest was negative for PE.  It showed mild diffuse bronchial wall thickening.  There are scattered bilateral tree-in-bud nodules.  The patient was started on bronchodilators IV Solu-Medrol  in the emergency department.  He was given ceftriaxone  and azithromycin .

## 2024-06-22 NOTE — ED Provider Notes (Cosign Needed)
 " [place] EMERGENCY DEPARTMENT AT [facility] Provider Note   CSN: [id number] Arrival date & time: 06/22/24  9046     Patient presents with: Shortness of Breath   [name] [name] is a 84 y.o. male.   Patient is an 84 year old male with a past medical history of atrial fibrillation, CAD, COPD who presents emergency department the chief complaint of shortness of breath.  He notes that he has been experiencing generalized weakness, cough and congestion for approximate the past 2 weeks.  He notes that shortness of breath became worse today.  He does note that he has had some increased swelling to his lower extremities.  He denies any active chest pain, abdominal pain, nausea, vomiting, diarrhea.  Cough has been nonproductive in nature.  He has had no associated fever or chills.   Shortness of Breath      Prior to Admission medications  Medication Sig Start Date End Date Taking? Authorizing Provider  albuterol  (PROVENTIL ) (2.5 MG/3ML) 0.083% nebulizer solution Take 3 mLs (2.5 mg total) by nebulization every 6 (six) hours as needed for wheezing or shortness of breath. 07/26/23   Darlean Ozell NOVAK, MD  albuterol  (VENTOLIN  HFA) 108 (90 Base) MCG/ACT inhaler Inhale 2 puffs into the lungs every 4 (four) hours as needed for wheezing or shortness of breath. 05/07/24   Neda Jennet LABOR, MD  apixaban  (ELIQUIS ) 5 MG TABS tablet Take 1 tablet (5 mg total) by mouth 2 (two) times daily. 03/29/24   Delford Maude BROCKS, MD  atenolol  (TENORMIN ) 25 MG tablet Take 1 tablet (25 mg total) by mouth daily. 06/21/24   Strader, Laymon HERO, PA-C  cefdinir  (OMNICEF ) 300 MG capsule Take 1 capsule (300 mg total) by mouth 2 (two) times daily. Patient not taking: Reported on 06/21/2024 05/15/24   Cook, Jayce G, DO  dronedarone  (MULTAQ ) 400 MG tablet Take 1 tablet (400 mg total) by mouth 2 (two) times daily with a meal. 05/09/24   Delford Maude BROCKS, MD  esomeprazole  (NEXIUM ) 40 MG capsule Take 30-60 min before first meal of  the day 09/22/23   Darlean Ozell NOVAK, MD  fluconazole  (DIFLUCAN ) 100 MG tablet Take 1 tablet (100 mg total) by mouth daily. X 7 days 06/08/24   Sherrilee Belvie CROME, MD  fluticasone -salmeterol (ADVAIR  HFA) 230-21 MCG/ACT inhaler Take 2 puffs first thing in am and then another 2 puffs about 12 hours later. 10/20/23   Darlean Ozell NOVAK, MD  latanoprost  (XALATAN ) 0.005 % ophthalmic solution Place 1 drop into both eyes at bedtime.    [provider]  loratadine  (CLARITIN ) 10 MG tablet Take 10 mg by mouth daily.    [provider]  LORazepam  (ATIVAN ) 1 MG tablet Take 1 tablet (1 mg total) by mouth at bedtime as needed for sleep. 05/15/24   Cook, Jayce G, DO  lovastatin  (MEVACOR ) 40 MG tablet Take 1 tablet (40 mg total) by mouth daily. 04/02/24   Cook, Jayce G, DO  metroNIDAZOLE  (METROGEL ) 0.75 % gel Apply 1 Application topically daily as needed (rosacea). Patient taking differently: Apply 1 Application topically as needed (rosacea). 06/20/23   [provider]  nystatin -triamcinolone  ointment (MYCOLOG) Apply 1 Application topically 2 (two) times daily. 06/08/24   McKenzie, Belvie CROME, MD  oxymetazoline  (AFRIN) 0.05 % nasal spray Place 1 spray into both nostrils 2 (two) times daily as needed (Nose bleed). Patient taking differently: Place 1 spray into both nostrils as needed (Nose bleed).    [provider]  Spacer/Aero-Holding Raguel  DEVI Use as directed 02/17/22   Neda Hammond A, MD  tamsulosin  (FLOMAX ) 0.4 MG CAPS capsule Take 2 capsules (0.8 mg total) by mouth daily. 01/20/24   Cook, Jayce G, DO  triamcinolone  cream (KENALOG ) 0.1 % Apply 1 Application topically 2 (two) times daily as needed (itching). 04/21/23   [provider]    Allergies: Bactrim  [sulfamethoxazole -trimethoprim ], Beta adrenergic blockers, Ciprofloxacin , Doxycycline , Lasix  [furosemide ], Dexamethasone , Gabapentin , Methocarbamol, Myrbetriq  [mirabegron ], Neomycin, Penicillins, and Tetracyclines & related     Review of Systems  Respiratory:  Positive for shortness of breath.   All other systems reviewed and are negative.   Updated Vital Signs BP 114/68   Pulse 86   Temp 98.4 F (36.9 C)   Resp 19   Ht 6' 1 (1.854 m)   Wt 96.2 kg   SpO2 93%   BMI 27.97 kg/m   Physical Exam Vitals and nursing note reviewed.  Constitutional:      General: He is not in acute distress.    Appearance: Normal appearance. He is not ill-appearing.  HENT:     Head: Normocephalic and atraumatic.     Nose: Nose normal.     Mouth/Throat:     Mouth: Mucous membranes are moist.  Eyes:     Extraocular Movements: Extraocular movements intact.     Conjunctiva/sclera: Conjunctivae normal.     Pupils: Pupils are equal, round, and reactive to light.  Cardiovascular:     Rate and Rhythm: Normal rate and regular rhythm.     Pulses: Normal pulses.     Heart sounds: Normal heart sounds. No murmur heard.    No gallop.  Pulmonary:     Effort: Pulmonary effort is normal. No tachypnea.     Breath sounds: Wheezing and rales present. No decreased breath sounds or rhonchi.  Chest:     Chest wall: No tenderness.  Abdominal:     General: Abdomen is flat. Bowel sounds are normal.     Palpations: Abdomen is soft.     Tenderness: There is no abdominal tenderness. There is no guarding.  Musculoskeletal:        General: Normal range of motion.     Cervical back: Normal range of motion and neck supple.     Right lower leg: Edema present.     Left lower leg: Edema present.  Skin:    General: Skin is warm and dry.     Findings: No rash.  Neurological:     General: No focal deficit present.     Mental Status: He is alert and oriented to person, place, and time. Mental status is at baseline.     Cranial Nerves: No cranial nerve deficit.     Motor: No weakness.  Psychiatric:        Mood and Affect: Mood normal.        Behavior: Behavior normal.        Thought Content: Thought content normal.        Judgment:  Judgment normal.     (all labs ordered are listed, but only abnormal results are displayed) Labs Reviewed  RESP PANEL BY RT-PCR (RSV, FLU A&B, COVID)  [id number]  COMPREHENSIVE METABOLIC PANEL WITH GFR  CBC WITH DIFFERENTIAL/PLATELET  PRO BRAIN NATRIURETIC PEPTIDE  URINALYSIS, ROUTINE W REFLEX MICROSCOPIC  TROPONIN T, HIGH SENSITIVITY    EKG: None  Radiology: No results found.   .Critical Care  Performed by: Daralene Lonni BIRCH, PA-C Authorized by: Daralene Lonni BIRCH, PA-C   Critical care  provider statement:    Critical care time (minutes):  35   Critical care was necessary to treat or prevent imminent or life-threatening deterioration of the following conditions:  Respiratory failure   Critical care was time spent personally by me on the following activities:  Development of treatment plan with patient or surrogate, discussions with consultants, evaluation of patient's response to treatment, examination of patient, ordering and review of laboratory studies, ordering and review of radiographic studies, ordering and performing treatments and interventions, pulse oximetry, re-evaluation of patient's condition and review of old charts   I assumed direction of critical care for this patient from another provider in my specialty: no     Care discussed with: admitting provider      Medications Ordered in the ED  methylPREDNISolone  sodium succinate (SOLU-MEDROL ) 125 mg/2 mL injection 80 mg (has no administration in time range)  ipratropium-albuterol  (DUONEB) 0.5-2.5 (3) MG/3ML nebulizer solution 3 mL (3 mLs Nebulization Given 06/22/24 1029)  ipratropium-albuterol  (DUONEB) 0.5-2.5 (3) MG/3ML nebulizer solution 3 mL (3 mLs Nebulization Given 06/22/24 1029)                                    Medical Decision Making Amount and/or Complexity of Data Reviewed Labs: ordered. Radiology: ordered.  Risk Prescription drug management. Decision regarding hospitalization.   This patient  presents to the ED for concern of cough, shortness of breath, this involves an extensive number of treatment options, and is a complaint that carries with it a high risk of complications and morbidity.  The differential diagnosis includes pneumonia, acute viral syndrome, pulmonary embolus, ACS, COPD exacerbation, CHF   Co morbidities that complicate the patient evaluation  COPD, CAD, GERD, hyperlipidemia, hypertension   Additional history obtained:  Additional history obtained from medical records External records from outside source obtained and reviewed including medical records   Lab Tests:  I Ordered, and personally interpreted labs.  The pertinent results include: No leukocytosis, anemia at baseline, normal kidney function, elevated liver function, normal electrolytes, elevated BNP, elevated troponin, negative respiratory panel   Imaging Studies ordered:  I ordered imaging studies including CTA chest, chest x-ray I independently visualized and interpreted imaging which showed multifocal infiltrates, no pulmonary embolus I agree with the radiologist interpretation   Cardiac Monitoring: / EKG:  The patient was maintained on a cardiac monitor.  I personally viewed and interpreted the cardiac monitored which showed an underlying rhythm of: [place]eminy, no ST/T wave changes, no ischemic changes, no STEMI, EKG consistent with previous   Consultations Obtained:  I requested consultation with the hospitalist,  and discussed lab and imaging findings as well as pertinent plan - they recommend: Admission   Problem List / ED Course / Critical interventions / Medication management  Patient is doing well at this time and does remain stable.  Discussed with patient we will plan for admission to the hospital service given his about respiratory failure and multifocal infection.  He has been treated with Rocephin  and azithromycin .  CHF is still on the differential as he does have elevated BNP.   He has no other clinical indication for fluid overload at this point.  Have avoided any diuretics as he does have an allergy to Lasix .  He did have a negative respiratory panel.  Have discussed patient case with Dr. Evonnie with the hospital service who has excepted for admission. I ordered medication including DuoNeb, Solu-Medrol , Rocephin , azithromycin  for  pneumonia, COPD Reevaluation of the patient after these medicines showed that the patient improved I have reviewed the patients home medicines and have made adjustments as needed   Social Determinants of Health:  None   Test / Admission - Considered:  Admission     Final diagnoses:  None    ED Discharge Orders     None          Daralene Lonni BIRCH, PA-C 06/22/24 1559  "

## 2024-07-06 ENCOUNTER — Encounter (HOSPITAL_COMMUNITY): Payer: Self-pay

## 2024-07-06 ENCOUNTER — Ambulatory Visit (HOSPITAL_COMMUNITY): Admit: 2024-07-06 | Admitting: Cardiology

## 2024-07-06 SURGERY — ATRIAL FIBRILLATION ABLATION
Anesthesia: General

## 2024-07-24 ENCOUNTER — Ambulatory Visit (HOSPITAL_COMMUNITY)

## 2024-07-24 ENCOUNTER — Ambulatory Visit: Admitting: Vascular Surgery

## 2024-09-05 ENCOUNTER — Ambulatory Visit: Admitting: Family Medicine

## 2024-12-26 ENCOUNTER — Ambulatory Visit: Admitting: Urology
